# Patient Record
Sex: Female | Born: 1935 | Race: Black or African American | Hispanic: No | Smoking: Never smoker
Health system: Southern US, Community
[De-identification: ages and names within clinical notes are randomized; demographics above are authoritative.]

## PROBLEM LIST (undated history)

## (undated) DIAGNOSIS — M810 Age-related osteoporosis without current pathological fracture: Secondary | ICD-10-CM

## (undated) DIAGNOSIS — I739 Peripheral vascular disease, unspecified: Secondary | ICD-10-CM

## (undated) DIAGNOSIS — M65312 Trigger thumb, left thumb: Secondary | ICD-10-CM

## (undated) DIAGNOSIS — D72819 Decreased white blood cell count, unspecified: Secondary | ICD-10-CM

## (undated) DIAGNOSIS — R531 Weakness: Secondary | ICD-10-CM

## (undated) DIAGNOSIS — J310 Chronic rhinitis: Secondary | ICD-10-CM

## (undated) DIAGNOSIS — E7800 Pure hypercholesterolemia, unspecified: Secondary | ICD-10-CM

## (undated) DIAGNOSIS — D329 Benign neoplasm of meninges, unspecified: Secondary | ICD-10-CM

## (undated) DIAGNOSIS — I700 Atherosclerosis of aorta: Secondary | ICD-10-CM

## (undated) DIAGNOSIS — M1990 Unspecified osteoarthritis, unspecified site: Secondary | ICD-10-CM

## (undated) DIAGNOSIS — G459 Transient cerebral ischemic attack, unspecified: Secondary | ICD-10-CM

## (undated) DIAGNOSIS — K2970 Gastritis, unspecified, without bleeding: Secondary | ICD-10-CM

## (undated) DIAGNOSIS — I10 Essential (primary) hypertension: Secondary | ICD-10-CM

## (undated) DIAGNOSIS — H409 Unspecified glaucoma: Secondary | ICD-10-CM

## (undated) DIAGNOSIS — H269 Unspecified cataract: Secondary | ICD-10-CM

## (undated) DIAGNOSIS — A0472 Enterocolitis due to Clostridium difficile, not specified as recurrent: Secondary | ICD-10-CM

## (undated) DIAGNOSIS — K4020 Bilateral inguinal hernia, without obstruction or gangrene, not specified as recurrent: Secondary | ICD-10-CM

## (undated) DIAGNOSIS — R002 Palpitations: Secondary | ICD-10-CM

## (undated) DIAGNOSIS — L298 Other pruritus: Secondary | ICD-10-CM

## (undated) DIAGNOSIS — H532 Diplopia: Secondary | ICD-10-CM

## (undated) DIAGNOSIS — D649 Anemia, unspecified: Secondary | ICD-10-CM

## (undated) DIAGNOSIS — R55 Syncope and collapse: Secondary | ICD-10-CM

## (undated) DIAGNOSIS — R42 Dizziness and giddiness: Secondary | ICD-10-CM

## (undated) DIAGNOSIS — E785 Hyperlipidemia, unspecified: Secondary | ICD-10-CM

## (undated) DIAGNOSIS — E860 Dehydration: Secondary | ICD-10-CM

## (undated) HISTORY — DX: Atherosclerosis of aorta: I70.0

## (undated) HISTORY — DX: Peripheral vascular disease, unspecified: I73.9

## (undated) HISTORY — DX: Decreased white blood cell count, unspecified: D72.819

## (undated) HISTORY — DX: Hyperlipidemia, unspecified: E78.5

## (undated) HISTORY — DX: Diplopia: H53.2

## (undated) HISTORY — DX: Chronic rhinitis: J31.0

## (undated) HISTORY — DX: Unspecified glaucoma: H40.9

## (undated) HISTORY — DX: Palpitations: R00.2

## (undated) HISTORY — DX: Benign neoplasm of meninges, unspecified: D32.9

## (undated) HISTORY — DX: Gastritis, unspecified, without bleeding: K29.70

## (undated) HISTORY — DX: Bilateral inguinal hernia, without obstruction or gangrene, not specified as recurrent: K40.20

## (undated) HISTORY — DX: Trigger thumb, left thumb: M65.312

## (undated) HISTORY — PX: COLONOSCOPY: SHX174

## (undated) HISTORY — DX: Unspecified cataract: H26.9

## (undated) HISTORY — DX: Enterocolitis due to Clostridium difficile, not specified as recurrent: A04.72

## (undated) HISTORY — PX: TONSILLECTOMY: SUR1361

---
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
## 1968-04-27 HISTORY — PX: ANAL FISTULECTOMY: SHX1139

## 1988-12-26 DIAGNOSIS — G459 Transient cerebral ischemic attack, unspecified: Secondary | ICD-10-CM

## 1988-12-26 HISTORY — DX: Transient cerebral ischemic attack, unspecified: G45.9

## 1998-08-27 ENCOUNTER — Other Ambulatory Visit: Admission: RE | Admit: 1998-08-27 | Discharge: 1998-08-27 | Payer: Self-pay | Admitting: Gynecology

## 1999-04-17 ENCOUNTER — Encounter: Admission: RE | Admit: 1999-04-17 | Discharge: 1999-04-17 | Payer: Self-pay | Admitting: Gynecology

## 1999-04-17 ENCOUNTER — Encounter: Payer: Self-pay | Admitting: Gynecology

## 1999-10-14 ENCOUNTER — Other Ambulatory Visit: Admission: RE | Admit: 1999-10-14 | Discharge: 1999-10-14 | Payer: Self-pay | Admitting: Gynecology

## 1999-11-20 ENCOUNTER — Ambulatory Visit (HOSPITAL_COMMUNITY): Admission: RE | Admit: 1999-11-20 | Discharge: 1999-11-20 | Payer: Self-pay | Admitting: *Deleted

## 2000-04-22 ENCOUNTER — Encounter: Payer: Self-pay | Admitting: Gynecology

## 2000-04-22 ENCOUNTER — Encounter: Admission: RE | Admit: 2000-04-22 | Discharge: 2000-04-22 | Payer: Self-pay | Admitting: Gynecology

## 2000-05-04 ENCOUNTER — Ambulatory Visit (HOSPITAL_COMMUNITY): Admission: RE | Admit: 2000-05-04 | Discharge: 2000-05-04 | Payer: Self-pay | Admitting: Internal Medicine

## 2000-10-20 ENCOUNTER — Other Ambulatory Visit: Admission: RE | Admit: 2000-10-20 | Discharge: 2000-10-20 | Payer: Self-pay | Admitting: Gynecology

## 2001-04-25 ENCOUNTER — Encounter: Admission: RE | Admit: 2001-04-25 | Discharge: 2001-04-25 | Payer: Self-pay | Admitting: Gynecology

## 2001-04-25 ENCOUNTER — Encounter: Payer: Self-pay | Admitting: Gynecology

## 2001-04-27 DIAGNOSIS — R55 Syncope and collapse: Secondary | ICD-10-CM

## 2001-04-27 HISTORY — DX: Syncope and collapse: R55

## 2001-05-11 ENCOUNTER — Ambulatory Visit (HOSPITAL_COMMUNITY): Admission: RE | Admit: 2001-05-11 | Discharge: 2001-05-11 | Payer: Self-pay | Admitting: Internal Medicine

## 2001-06-10 ENCOUNTER — Encounter: Admission: RE | Admit: 2001-06-10 | Discharge: 2001-06-10 | Payer: Self-pay | Admitting: Orthopedic Surgery

## 2001-06-10 ENCOUNTER — Encounter: Payer: Self-pay | Admitting: Orthopedic Surgery

## 2001-11-14 ENCOUNTER — Other Ambulatory Visit: Admission: RE | Admit: 2001-11-14 | Discharge: 2001-11-14 | Payer: Self-pay | Admitting: Gynecology

## 2001-11-22 ENCOUNTER — Emergency Department (HOSPITAL_COMMUNITY): Admission: EM | Admit: 2001-11-22 | Discharge: 2001-11-22 | Payer: Self-pay | Admitting: Emergency Medicine

## 2001-11-22 ENCOUNTER — Encounter: Payer: Self-pay | Admitting: Emergency Medicine

## 2001-11-25 ENCOUNTER — Ambulatory Visit (HOSPITAL_COMMUNITY): Admission: RE | Admit: 2001-11-25 | Discharge: 2001-11-25 | Payer: Self-pay | Admitting: Internal Medicine

## 2002-04-11 ENCOUNTER — Encounter: Admission: RE | Admit: 2002-04-11 | Discharge: 2002-04-11 | Payer: Self-pay | Admitting: Gynecology

## 2002-04-11 ENCOUNTER — Encounter: Payer: Self-pay | Admitting: Gynecology

## 2003-04-24 ENCOUNTER — Encounter: Admission: RE | Admit: 2003-04-24 | Discharge: 2003-04-24 | Payer: Self-pay | Admitting: Gynecology

## 2003-11-22 ENCOUNTER — Other Ambulatory Visit: Admission: RE | Admit: 2003-11-22 | Discharge: 2003-11-22 | Payer: Self-pay | Admitting: Gynecology

## 2004-05-07 ENCOUNTER — Encounter: Admission: RE | Admit: 2004-05-07 | Discharge: 2004-05-07 | Payer: Self-pay | Admitting: Gynecology

## 2004-10-01 ENCOUNTER — Ambulatory Visit (HOSPITAL_COMMUNITY): Admission: RE | Admit: 2004-10-01 | Discharge: 2004-10-01 | Payer: Self-pay | Admitting: Gastroenterology

## 2004-10-02 ENCOUNTER — Ambulatory Visit (HOSPITAL_COMMUNITY): Admission: RE | Admit: 2004-10-02 | Discharge: 2004-10-02 | Payer: Self-pay | Admitting: Gastroenterology

## 2005-06-22 ENCOUNTER — Encounter: Admission: RE | Admit: 2005-06-22 | Discharge: 2005-06-22 | Payer: Self-pay | Admitting: Internal Medicine

## 2005-06-30 ENCOUNTER — Encounter: Admission: RE | Admit: 2005-06-30 | Discharge: 2005-06-30 | Payer: Self-pay | Admitting: Gynecology

## 2005-12-08 ENCOUNTER — Other Ambulatory Visit: Admission: RE | Admit: 2005-12-08 | Discharge: 2005-12-08 | Payer: Self-pay | Admitting: Gynecology

## 2006-08-10 ENCOUNTER — Encounter: Admission: RE | Admit: 2006-08-10 | Discharge: 2006-08-10 | Payer: Self-pay | Admitting: Internal Medicine

## 2007-07-26 ENCOUNTER — Encounter: Admission: RE | Admit: 2007-07-26 | Discharge: 2007-07-26 | Payer: Self-pay | Admitting: Internal Medicine

## 2007-08-18 ENCOUNTER — Encounter: Admission: RE | Admit: 2007-08-18 | Discharge: 2007-08-18 | Payer: Self-pay | Admitting: Gynecology

## 2008-08-21 ENCOUNTER — Encounter: Admission: RE | Admit: 2008-08-21 | Discharge: 2008-08-21 | Payer: Self-pay | Admitting: Internal Medicine

## 2009-08-27 ENCOUNTER — Encounter: Admission: RE | Admit: 2009-08-27 | Discharge: 2009-08-27 | Payer: Self-pay | Admitting: Internal Medicine

## 2010-08-04 ENCOUNTER — Other Ambulatory Visit: Payer: Self-pay | Admitting: Gynecology

## 2010-08-04 DIAGNOSIS — Z1231 Encounter for screening mammogram for malignant neoplasm of breast: Secondary | ICD-10-CM

## 2010-09-12 NOTE — Op Note (Signed)
Sydney [name], Sydney [name]               ACCOUNT NO.:  [id number]   MEDICAL RECORD NO.:  [id number]          PATIENT TYPE:  AMB   LOCATION:  ENDO                         FACILITY:  [facility]   PHYSICIAN:  Danise Edge, M.D.   DATE OF BIRTH:  1930-04-30   DATE OF PROCEDURE:  10/01/2004  DATE OF DISCHARGE:                                 OPERATIVE REPORT   PROCEDURE:  Incomplete screening colonoscopy.   INDICATIONS:  [name] mildly is a 77 year old female born Apr 04, 1931.  [name] was scheduled to undergo her first screening colonoscopy with  polypectomy to prevent colon cancer.  Due to colonic loop formation, I was  able to advance the scope to around the hepatic flexure.  I was unable to  examine the ascending colon, cecum or ileocecal valve.   ENDOSCOPIST:  Danise Edge, M.D.   PREMEDICATION:  Fentanyl 62.5 mcg, Versed 6 mg.   DESCRIPTION OF PROCEDURE:  After obtaining informed consent, [name] was  placed in the left lateral decubitus position.  I administered intravenous  fentanyl and intravenous Versed to achieve conscious sedation for the  procedure.  The patient's blood pressure, oxygen saturation and cardiac  rhythm were monitored throughout the procedure and documented in the medical  record.   Anal inspection and digital rectal exam were normal.  The Olympus adjustable  pediatric colonoscope was introduced into the rectum and advanced to the  hepatic flexure.  Despite repositioning the patient from the left lateral  decubitus position to the supine position and finally the right lateral  decubitus position applying external abdominal pressure, I was unable to  round the hepatic flexure and examine the right colon.  Colonic preparation  for the exam today was excellent.   FINDINGS:  1.  Rectum normal.  2.  Sigmoid colon and descending colon normal.  3.  Splenic flexure normal.  4.  Transverse colon normal.  5.  Hepatic flexure normal.  6.  Ascending colon,  cecum and ileocecal valve were not examined.   ASSESSMENT:  Normal screening proctocolonoscopy to the hepatic flexure   RECOMMENDATIONS:  [name] will undergo an air contrast barium enema at  the [facility] later this morning.      MJ/MEDQ  D:  10/01/2004  T:  10/01/2004  Job:  [id number]

## 2010-09-16 ENCOUNTER — Ambulatory Visit
Admission: RE | Admit: 2010-09-16 | Discharge: 2010-09-16 | Disposition: A | Discharge status: Home / Self Care | Payer: Medicare Other | Source: Ambulatory Visit | Attending: Gynecology | Admitting: Gynecology

## 2010-09-16 DIAGNOSIS — Z1231 Encounter for screening mammogram for malignant neoplasm of breast: Secondary | ICD-10-CM

## 2011-08-24 ENCOUNTER — Other Ambulatory Visit: Payer: Self-pay | Admitting: Gynecology

## 2011-08-24 DIAGNOSIS — Z1231 Encounter for screening mammogram for malignant neoplasm of breast: Secondary | ICD-10-CM

## 2011-10-01 ENCOUNTER — Ambulatory Visit
Admission: RE | Admit: 2011-10-01 | Discharge: 2011-10-01 | Disposition: A | Discharge status: Home / Self Care | Payer: No Typology Code available for payment source | Source: Ambulatory Visit | Attending: Gynecology | Admitting: Gynecology

## 2011-10-01 DIAGNOSIS — Z1231 Encounter for screening mammogram for malignant neoplasm of breast: Secondary | ICD-10-CM

## 2012-06-21 ENCOUNTER — Ambulatory Visit
Admission: RE | Admit: 2012-06-21 | Discharge: 2012-06-21 | Disposition: A | Discharge status: Home / Self Care | Payer: Medicare Other | Source: Ambulatory Visit | Attending: Internal Medicine | Admitting: Internal Medicine

## 2012-06-21 ENCOUNTER — Other Ambulatory Visit: Payer: Self-pay | Admitting: Internal Medicine

## 2012-06-21 DIAGNOSIS — R109 Unspecified abdominal pain: Secondary | ICD-10-CM

## 2012-06-21 MED ORDER — IOHEXOL 300 MG/ML  SOLN
100.0000 mL | Freq: Once | INTRAMUSCULAR | Status: AC | PRN
  Administered 2012-06-21: 100 mL via INTRAVENOUS

## 2012-08-30 ENCOUNTER — Other Ambulatory Visit: Payer: Self-pay

## 2012-08-30 DIAGNOSIS — Z1231 Encounter for screening mammogram for malignant neoplasm of breast: Secondary | ICD-10-CM

## 2012-10-20 ENCOUNTER — Ambulatory Visit
Admission: RE | Admit: 2012-10-20 | Discharge: 2012-10-20 | Disposition: A | Discharge status: Home / Self Care | Payer: Medicare Other | Source: Ambulatory Visit

## 2012-10-20 DIAGNOSIS — Z1231 Encounter for screening mammogram for malignant neoplasm of breast: Secondary | ICD-10-CM

## 2012-12-06 ENCOUNTER — Encounter (HOSPITAL_COMMUNITY): Payer: Self-pay | Admitting: Emergency Medicine

## 2012-12-06 ENCOUNTER — Emergency Department (HOSPITAL_COMMUNITY)
Admission: EM | Admit: 2012-12-06 | Discharge: 2012-12-06 | Disposition: A | Discharge status: Home / Self Care | Payer: Medicare Other | Attending: Emergency Medicine | Admitting: Emergency Medicine

## 2012-12-06 ENCOUNTER — Emergency Department (HOSPITAL_COMMUNITY): Payer: Medicare Other

## 2012-12-06 DIAGNOSIS — Z7902 Long term (current) use of antithrombotics/antiplatelets: Secondary | ICD-10-CM | POA: Insufficient documentation

## 2012-12-06 DIAGNOSIS — G459 Transient cerebral ischemic attack, unspecified: Secondary | ICD-10-CM | POA: Insufficient documentation

## 2012-12-06 DIAGNOSIS — I10 Essential (primary) hypertension: Secondary | ICD-10-CM | POA: Insufficient documentation

## 2012-12-06 DIAGNOSIS — M810 Age-related osteoporosis without current pathological fracture: Secondary | ICD-10-CM | POA: Insufficient documentation

## 2012-12-06 DIAGNOSIS — Z79899 Other long term (current) drug therapy: Secondary | ICD-10-CM | POA: Insufficient documentation

## 2012-12-06 DIAGNOSIS — R51 Headache: Secondary | ICD-10-CM | POA: Insufficient documentation

## 2012-12-06 DIAGNOSIS — Z7982 Long term (current) use of aspirin: Secondary | ICD-10-CM | POA: Insufficient documentation

## 2012-12-06 HISTORY — DX: Essential (primary) hypertension: I10

## 2012-12-06 HISTORY — DX: Age-related osteoporosis without current pathological fracture: M81.0

## 2012-12-06 LAB — COMPREHENSIVE METABOLIC PANEL
ALT: 14 U/L (ref 0–35)
AST: 25 U/L (ref 0–37)
Albumin: 3.8 g/dL (ref 3.5–5.2)
Alkaline Phosphatase: 76 U/L (ref 39–117)
CO2: 26 mEq/L (ref 19–32)
GFR calc Af Amer: >90 mL/min (ref >90)
GFR calc non Af Amer: 79 mL/min — ABNORMAL LOW (ref >90)
Sodium: 133 mEq/L — ABNORMAL LOW (ref 135–145)
Total Bilirubin: 0.3 mg/dL (ref 0.3–1.2)

## 2012-12-06 LAB — CBC
Hemoglobin: 12.4 g/dL (ref 12.0–15.0)
MCH: 31.1 pg (ref 26.0–34.0)
Platelets: 245 10*3/uL (ref 150–400)
RDW: 13.1 % (ref 11.5–15.5)
WBC: 2.7 10*3/uL — ABNORMAL LOW (ref 4.0–10.5)

## 2012-12-06 LAB — DIFFERENTIAL
Basophils Absolute: 0 10*3/uL (ref 0.0–0.1)
Basophils Relative: 0 % (ref 0–1)
Eosinophils Absolute: 0 10*3/uL (ref 0.0–0.7)
Eosinophils Relative: 0 % (ref 0–5)
Monocytes Absolute: 0.4 10*3/uL (ref 0.1–1.0)
Neutro Abs: 1.2 10*3/uL — ABNORMAL LOW (ref 1.7–7.7)

## 2012-12-06 LAB — TROPONIN I: Troponin I: <0.3 ng/mL (ref <0.30)

## 2012-12-06 LAB — POCT I-STAT TROPONIN I

## 2012-12-06 LAB — GLUCOSE, CAPILLARY: Glucose-Capillary: 92 mg/dL (ref 70–99)

## 2012-12-06 NOTE — ED Notes (Signed)
Pt c/o blurry vision at 1300 with right sided HA than then resolved; pt noted was hypertensive and feels back to normal at present

## 2012-12-06 NOTE — ED Provider Notes (Signed)
CSN: [id number]     Arrival date & time 12/06/12  1637 History     First MD Initiated Contact with Patient 12/06/12 1722     Chief Complaint  Patient presents with  . Blurred Vision  . Headache   (Consider location/radiation/quality/duration/timing/severity/associated sxs/prior Treatment) The history is provided by the patient, medical records and a relative. No language interpreter was used.   79 year old female presents emergency department chief complaint of stroke like symptoms.  Patient was sent to the ED by her primary care clinic and or TIA.  Patient states that around 1 PM this afternoon she began having visual changes in the left eye followed by intermittent headaches on the right side.  She's had several TIAs in the past and outpatient TIA workups including carotid Dopplers in 2007 that showed bilateral carotid without significant stenosis or decreased blood flow.  She states that this is exactly the same as her previous TIA symptoms.  She denies any other symptom of stroke including weakness, vertiginous symptoms, difficulty swallowing, facial droop, difficulty with speech, paresthesia.  Patient takes one baby aspirin daily.  Her symptoms lasted approximately one hour with resolution.  She had no return of her symptoms at that time.  Past Medical History  Diagnosis Date  . Hypertension   . Osteoporosis    History reviewed. No pertinent past surgical history. History reviewed. No pertinent family history. History  Substance Use Topics  . Smoking status: Never Smoker   . Smokeless tobacco: Not on file  . Alcohol Use: No   OB History   Grav Para Term Preterm Abortions TAB SAB Ect Mult Living                 Review of Systems Ten systems reviewed and are negative for acute change, except as noted in the HPI.    Allergies  Codeine; Demerol; and Sulfa antibiotics  Home Medications   Current Outpatient Rx  Name  Route  Sig  Dispense  Refill  . aspirin 81 MG tablet  Oral   Take 81 mg by mouth daily.         . cholecalciferol (VITAMIN D) 1000 UNITS tablet   Oral   Take 2,000 Units by mouth daily.         . clopidogrel (PLAVIX) 75 MG tablet   Oral   Take 75 mg by mouth daily.         . folic acid (FOLVITE) 1 MG tablet   Oral   Take 1 mg by mouth daily.         . hydrochlorothiazide (HYDRODIURIL) 25 MG tablet   Oral   Take 12.5 mg by mouth daily.         Marland Kitchen ketoconazole (NIZORAL) 2 % shampoo   Topical   Apply 1 application topically 2 (two) times a week.         . loratadine (CLARITIN) 10 MG tablet   Oral   Take 10 mg by mouth daily as needed for allergies.         . metoprolol (LOPRESSOR) 50 MG tablet   Oral   Take 25 mg by mouth 2 (two) times daily.         . potassium chloride (K-DUR) 10 MEQ tablet   Oral   Take 10 mEq by mouth daily.         Marland Kitchen triamcinolone cream (KENALOG) 0.1 %   Topical   Apply 1 application topically daily as needed.  BP 139/66  Pulse 55  Temp(Src) 97.7 F (36.5 C) (Oral)  Resp 14  SpO2 100% Physical Exam Physical Exam  Nursing note and vitals reviewed. Constitutional: She is oriented to person, place, and time. She appears well-developed and well-nourished. No distress.  Patient is lucid and mentally sharp HENT:  Head: Normocephalic and atraumatic.  Eyes: Conjunctivae normal and EOM are normal. Pupils are equal, round, and reactive to light. No scleral icterus.  Neck: Normal range of motion.  Cardiovascular: Normal rate, regular rhythm and normal heart sounds.  Exam reveals no gallop and no friction rub.   No murmur heard. Pulmonary/Chest: Effort normal and breath sounds normal. No respiratory distress.  Abdominal: Soft. Bowel sounds are normal. She exhibits no distension and no mass. There is no tenderness. There is no guarding.  Neurological: She is alert and oriented to person, place, and time.  Speech is clear and goal oriented, follows commands Major Cranial nerves  without deficit, no facial droop Normal strength in upper and lower extremities bilaterally including dorsiflexion and plantar flexion, strong and equal grip strength Sensation normal to light and sharp touch Moves extremities without ataxia, coordination intact Normal finger to nose and rapid alternating movements Neg romberg, no pronator drift Normal gait Normal heel-shin and balance Skin: Skin is warm and dry. She is not diaphoretic.    ED Course   Procedures (including critical care time)  Labs Reviewed  CBC - Abnormal; Notable for the following:    WBC 2.7 (*)    HCT 35.4 (*)    All other components within normal limits  DIFFERENTIAL - Abnormal; Notable for the following:    Neutro Abs 1.2 (*)    Monocytes Relative 14 (*)    All other components within normal limits  COMPREHENSIVE METABOLIC PANEL - Abnormal; Notable for the following:    Sodium 133 (*)    GFR calc non Af Amer 79 (*)    All other components within normal limits  TROPONIN I  GLUCOSE, CAPILLARY  POCT I-STAT TROPONIN I   Ct Head (brain) Wo Contrast  12/06/2012   *RADIOLOGY REPORT*  Clinical Data: Blurred vision and headache, now resolved  CT HEAD WITHOUT CONTRAST  Technique:  Contiguous axial images were obtained from the base of the skull through the vertex without contrast.  Comparison: None.  Findings: Mild diffuse cortical volume loss noted with proportional ventricular prominence. No acute hemorrhage, acute infarction, or mass lesion is seen.  Orbits and paranasal sinuses are intact.  IMPRESSION: No acute intracranial finding.   Original Report Authenticated By: Christiana Pellant, M.D.   1. TIA (transient ischemic attack)     MDM   Filed Vitals:   12/06/12 1919 12/06/12 1920 12/06/12 1930 12/06/12 2000  BP: 152/68  135/62 148/58  Pulse: 57  53 52  Temp:  97.8 F (36.6 C)    TempSrc:      Resp: 20     SpO2: 100%  100% 100%   Patient here with complete resolution of strokelike symptoms.  She is a  known history of TIA.  He has strong outpatient followup.  Her labs show glucopenia mild hyponatremia CT is negative for acute hemorrhage or infarct.  She has an unremarkable neurologic exam.  Patient's last carotid Dopplers weren't 2007 believe she needs repeat carotid Doppler studies to assess stenosis. The patient is seen and shared visit with Dr. Jodi Mourning, she is stable with no return of her neurologic symptoms.  She is safe for discharge with outpatient followup.  Patient requests to go home this evening.  We have discussed possibility of stroke and need to return immediately to the emergency department should he have return of her symptoms.  The patient expresses understanding and agrees with plan of care.  Arthor Captain, PA-C 12/10/12 1616

## 2012-12-12 ENCOUNTER — Other Ambulatory Visit: Payer: Self-pay | Admitting: Internal Medicine

## 2012-12-12 DIAGNOSIS — H538 Other visual disturbances: Secondary | ICD-10-CM

## 2012-12-14 NOTE — ED Provider Notes (Signed)
Medical screening examination/treatment/procedure(s) were conducted as a shared visit with non-physician practitioner(s) or resident  and myself.  I personally evaluated the patient during the encounter and agree with the findings and plan unless otherwise indicated.    Transient left eye vision changes with gradual onset HA.  Possible TIA vs migraine vs bleed.  PERRL, conj nl, CNs, strength, sensation, coordination all intact.  No sxs in ED.  TIA hx.  Offered observation in hospital for close monitoring of further stroke sxs however patient prefers outpt fup and has family nearby.  Strict reasons to return given.   Enid Skeens, MD 12/14/12 1224

## 2012-12-15 ENCOUNTER — Ambulatory Visit
Admission: RE | Admit: 2012-12-15 | Discharge: 2012-12-15 | Disposition: A | Discharge status: Home / Self Care | Payer: Medicare Other | Source: Ambulatory Visit | Attending: Internal Medicine | Admitting: Internal Medicine

## 2012-12-15 DIAGNOSIS — H538 Other visual disturbances: Secondary | ICD-10-CM

## 2013-02-17 ENCOUNTER — Emergency Department (HOSPITAL_COMMUNITY): Payer: Medicare Other

## 2013-02-17 ENCOUNTER — Encounter (HOSPITAL_COMMUNITY): Payer: Self-pay | Admitting: Emergency Medicine

## 2013-02-17 ENCOUNTER — Observation Stay (HOSPITAL_COMMUNITY)
Admission: EM | Admit: 2013-02-17 | Discharge: 2013-02-18 | Disposition: A | Discharge status: Home / Self Care | Payer: Medicare Other | Attending: Internal Medicine | Admitting: Internal Medicine

## 2013-02-17 DIAGNOSIS — Z881 Allergy status to other antibiotic agents status: Secondary | ICD-10-CM | POA: Insufficient documentation

## 2013-02-17 DIAGNOSIS — G459 Transient cerebral ischemic attack, unspecified: Secondary | ICD-10-CM | POA: Insufficient documentation

## 2013-02-17 DIAGNOSIS — Z882 Allergy status to sulfonamides status: Secondary | ICD-10-CM | POA: Insufficient documentation

## 2013-02-17 DIAGNOSIS — Z79899 Other long term (current) drug therapy: Secondary | ICD-10-CM | POA: Insufficient documentation

## 2013-02-17 DIAGNOSIS — R001 Bradycardia, unspecified: Secondary | ICD-10-CM | POA: Diagnosis present

## 2013-02-17 DIAGNOSIS — I10 Essential (primary) hypertension: Secondary | ICD-10-CM | POA: Insufficient documentation

## 2013-02-17 DIAGNOSIS — Z885 Allergy status to narcotic agent status: Secondary | ICD-10-CM | POA: Insufficient documentation

## 2013-02-17 DIAGNOSIS — I498 Other specified cardiac arrhythmias: Secondary | ICD-10-CM | POA: Insufficient documentation

## 2013-02-17 DIAGNOSIS — I6529 Occlusion and stenosis of unspecified carotid artery: Secondary | ICD-10-CM | POA: Diagnosis present

## 2013-02-17 DIAGNOSIS — E7800 Pure hypercholesterolemia, unspecified: Secondary | ICD-10-CM | POA: Insufficient documentation

## 2013-02-17 DIAGNOSIS — R55 Syncope and collapse: Principal | ICD-10-CM | POA: Insufficient documentation

## 2013-02-17 DIAGNOSIS — I369 Nonrheumatic tricuspid valve disorder, unspecified: Secondary | ICD-10-CM

## 2013-02-17 DIAGNOSIS — Z7902 Long term (current) use of antithrombotics/antiplatelets: Secondary | ICD-10-CM | POA: Insufficient documentation

## 2013-02-17 DIAGNOSIS — M810 Age-related osteoporosis without current pathological fracture: Secondary | ICD-10-CM | POA: Insufficient documentation

## 2013-02-17 HISTORY — DX: Pure hypercholesterolemia, unspecified: E78.00

## 2013-02-17 HISTORY — DX: Transient cerebral ischemic attack, unspecified: G45.9

## 2013-02-17 HISTORY — DX: Unspecified osteoarthritis, unspecified site: M19.90

## 2013-02-17 LAB — CBC
HCT: 33.7 % — ABNORMAL LOW (ref 36.0–46.0)
MCHC: 35.3 g/dL (ref 30.0–36.0)
Platelets: 220 10*3/uL (ref 150–400)
RBC: 3.78 MIL/uL — ABNORMAL LOW (ref 3.87–5.11)
RDW: 12.7 % (ref 11.5–15.5)
WBC: 2.5 10*3/uL — ABNORMAL LOW (ref 4.0–10.5)

## 2013-02-17 LAB — CBC WITH DIFFERENTIAL/PLATELET
Basophils Relative: 0 % (ref 0–1)
Eosinophils Relative: 0 % (ref 0–5)
HCT: 35.1 % — ABNORMAL LOW (ref 36.0–46.0)
Hemoglobin: 12.3 g/dL (ref 12.0–15.0)
MCH: 31.2 pg (ref 26.0–34.0)
MCHC: 35 g/dL (ref 30.0–36.0)
MCV: 89.1 fL (ref 78.0–100.0)
Monocytes Absolute: 0.3 10*3/uL (ref 0.1–1.0)
Monocytes Relative: 11 % (ref 3–12)
Neutro Abs: 1.8 10*3/uL (ref 1.7–7.7)
RDW: 12.7 % (ref 11.5–15.5)

## 2013-02-17 LAB — COMPREHENSIVE METABOLIC PANEL
Albumin: 3.7 g/dL (ref 3.5–5.2)
BUN: 10 mg/dL (ref 6–23)
Calcium: 9.3 mg/dL (ref 8.4–10.5)
Chloride: 99 mEq/L (ref 96–112)
Creatinine, Ser: 0.76 mg/dL (ref 0.50–1.10)
GFR calc non Af Amer: 78 mL/min — ABNORMAL LOW (ref >90)
Total Bilirubin: 0.5 mg/dL (ref 0.3–1.2)

## 2013-02-17 LAB — URINALYSIS, ROUTINE W REFLEX MICROSCOPIC
Bilirubin Urine: NEGATIVE
Ketones, ur: 15 mg/dL — AB
Nitrite: NEGATIVE
Specific Gravity, Urine: 1.015 (ref 1.005–1.030)

## 2013-02-17 LAB — TROPONIN I
Troponin I: <0.3 ng/mL (ref <0.30)
Troponin I: <0.3 ng/mL (ref <0.30)

## 2013-02-17 LAB — URINE MICROSCOPIC-ADD ON

## 2013-02-17 LAB — CREATININE, SERUM
GFR calc Af Amer: >90 mL/min (ref >90)
GFR calc non Af Amer: 80 mL/min — ABNORMAL LOW (ref >90)

## 2013-02-17 LAB — VITAMIN B12: Vitamin B-12: 338 pg/mL (ref 211–911)

## 2013-02-17 MED ORDER — SODIUM CHLORIDE 0.9 % IV SOLN
INTRAVENOUS | Status: AC
  Administered 2013-02-17: 15:00:00 via INTRAVENOUS

## 2013-02-17 MED ORDER — HEPARIN SODIUM (PORCINE) 5000 UNIT/ML IJ SOLN
5000.0000 [IU] | Freq: Three times a day (TID) | INTRAMUSCULAR | Status: DC
  Administered 2013-02-17 – 2013-02-18 (×2): 5000 [IU] via SUBCUTANEOUS
  Filled 2013-02-17 (×6): qty 1

## 2013-02-17 MED ORDER — ISOSORB DINITRATE-HYDRALAZINE 20-37.5 MG PO TABS
0.5000 | ORAL_TABLET | Freq: Two times a day (BID) | ORAL | Status: DC
  Administered 2013-02-17: 0.5 via ORAL
  Filled 2013-02-17 (×4): qty 0.5

## 2013-02-17 MED ORDER — HYDROCHLOROTHIAZIDE 12.5 MG PO CAPS
12.5000 mg | ORAL_CAPSULE | Freq: Every day | ORAL | Status: DC
  Filled 2013-02-17 (×2): qty 1

## 2013-02-17 MED ORDER — CLOPIDOGREL BISULFATE 75 MG PO TABS
75.0000 mg | ORAL_TABLET | Freq: Every day | ORAL | Status: DC
  Administered 2013-02-17 – 2013-02-18 (×2): 75 mg via ORAL
  Filled 2013-02-17 (×2): qty 1

## 2013-02-17 MED ORDER — HYDROCHLOROTHIAZIDE 25 MG PO TABS
12.5000 mg | ORAL_TABLET | Freq: Every day | ORAL | Status: DC
  Filled 2013-02-17: qty 0.5

## 2013-02-17 MED ORDER — ONDANSETRON HCL 4 MG/2ML IJ SOLN: 4.0000 mg | Freq: Four times a day (QID) | INTRAMUSCULAR | Status: DC | PRN

## 2013-02-17 MED ORDER — VITAMIN D3 25 MCG (1000 UNIT) PO TABS
1000.0000 [IU] | ORAL_TABLET | Freq: Every day | ORAL | Status: DC
  Administered 2013-02-17 – 2013-02-18 (×2): 1000 [IU] via ORAL
  Filled 2013-02-17 (×2): qty 1

## 2013-02-17 MED ORDER — POTASSIUM CHLORIDE CRYS ER 20 MEQ PO TBCR
40.0000 meq | EXTENDED_RELEASE_TABLET | Freq: Once | ORAL | Status: AC
  Administered 2013-02-17: 40 meq via ORAL
  Filled 2013-02-17: qty 2

## 2013-02-17 MED ORDER — ACETAMINOPHEN 325 MG PO TABS: 650.0000 mg | ORAL_TABLET | Freq: Four times a day (QID) | ORAL | Status: DC | PRN

## 2013-02-17 MED ORDER — FOLIC ACID 1 MG PO TABS
1.0000 mg | ORAL_TABLET | Freq: Every day | ORAL | Status: DC
  Administered 2013-02-17 – 2013-02-18 (×2): 1 mg via ORAL
  Filled 2013-02-17 (×2): qty 1

## 2013-02-17 MED ORDER — SODIUM CHLORIDE 0.9 % IJ SOLN
3.0000 mL | Freq: Two times a day (BID) | INTRAMUSCULAR | Status: DC
  Administered 2013-02-17: 3 mL via INTRAVENOUS

## 2013-02-17 MED ORDER — ONDANSETRON HCL 4 MG PO TABS: 4.0000 mg | ORAL_TABLET | Freq: Four times a day (QID) | ORAL | Status: DC | PRN

## 2013-02-17 MED ORDER — ACETAMINOPHEN 650 MG RE SUPP: 650.0000 mg | Freq: Four times a day (QID) | RECTAL | Status: DC | PRN

## 2013-02-17 NOTE — ED Notes (Signed)
[place], MD at bedside for evaluation. 

## 2013-02-17 NOTE — ED Notes (Signed)
Per EMS, patient got up about 0800 this morning and she got weak.   Patient's daughter called EMS with complaints of patient feeling weak.   Patient did have a syncopal episode while sitting in chair witnessed by EMS.   Patient advised no other symptoms.   Drop in pressure to 90/50 right after syncopal episode.  Patient has not taken home meds today.   Last BP by EMS 127/66 in trendelenburg.  CBG was 88 with EMS.  20 L AC placed by EMS.

## 2013-02-17 NOTE — H&P (Signed)
[facility] History and Physical  BYRD TERRERO ZOX:[id number] DOB: 16-Oct-1930 DOA: 02/17/2013  Referring physician: Dr. Loretha Stapler PCP: No primary provider on file.   Chief Complaint: syncope  HPI: Sydney [name] is a 79 y.o. female with a past medical history significant for osteoporosis, hypertension, carotid artery disease and TIA; came to the hospital after his visit in 7 of sudden onset of syncope. Patient reports that she woke up feeling herself and in her usual health state this morning went to the bathroom and had a BM and then p[roceed to sit down on dinning room for breakfast. Patient felt generalized weakness and w/o prodromic symptoms pass out. No signs or seizure activity appreciated, patient denies urine or fecal incontinence, no tongue bite. Also denies fever, chills, nausea, vomiting, palpitation, CP, HA, foal weakness or any other complaints. History was corroborated by daughter at bedside. In the ED patient was found to be bradycardic and with mild hypokalemia. Otherwise no abnormalities were appreciated. [facility] has been called to admit the patient for further evaluation and treatment of her syncope  Review of Systems: Negative except as otherwise mentioned on history of present illness.  Past Medical History  Diagnosis Date  . Hypertension   . Osteoporosis   . TIA (transient ischemic attack)    Past Surgical History  Procedure Laterality Date  . Tonsillectomy     Social History:  reports that she has never smoked. She does not have any smokeless tobacco history on file. She reports that she does not drink alcohol or use illicit drugs.  Allergies  Allergen Reactions  . Demerol [Meperidine] Other (See Comments)    Excessive sweating, cramping  . Sulfa Antibiotics Other (See Comments)    Reaction unknown  . Codeine Rash    Family history: Significant for heart problems and stroke; otherwise noncontributory.  Prior to Admission medications    Medication Sig Start Date End Date Taking? Authorizing Provider  cholecalciferol (VITAMIN D) 1000 UNITS tablet Take 1,000 Units by mouth daily.    Yes Historical Provider, MD  clopidogrel (PLAVIX) 75 MG tablet Take 75 mg by mouth daily.   Yes Historical Provider, MD  folic acid (FOLVITE) 1 MG tablet Take 1 mg by mouth daily.   Yes Historical Provider, MD  hydrochlorothiazide (HYDRODIURIL) 25 MG tablet Take 12.5 mg by mouth daily.   Yes Historical Provider, MD  metoprolol (LOPRESSOR) 50 MG tablet Take 25 mg by mouth 2 (two) times daily.   Yes Historical Provider, MD  potassium chloride (MICRO-K) 10 MEQ CR capsule Take 10 mEq by mouth daily.   Yes Historical Provider, MD   Physical Exam: Filed Vitals:   02/17/13 1150  BP: 149/75  Pulse: 57  Temp: 98.4 F (36.9 C)  Resp: 18     General:  Alert, awake and oriented x3, afebrile, able to speak in full sentences and without any acute complaints  Eyes: PERRLA, no icterus, extraocular muscles intact, no nystagmus  ENT: Mild mucosal membranes dryness, no erythema or exudate inside her mouth, no thrush; no drainage appreciated from ears or nostrils  Neck: Supple, no JVD, no thyromegaly  Cardiovascular: S1 and S2, positive bradycardia, no rubs, no murmurs, no gallops  Respiratory: Good air movement bilaterally, no wheezing, no crackles  Abdomen: Soft, nontender, nondistended, positive bowel sounds  Skin: No rash, no petechiae  Musculoskeletal: Full range of motion, no joint swelling or erythema, lower extremities without signs of edema, cyanosis or clubbing  Psychiatric: Mood appropriate; no suicidal ideation or  hallucinations  Neurologic: Alert, awake and oriented x3, cranial nerves grossly intact, muscle strength 5 out of 5 bilaterally and symmetrically; patient denies any specific focal sensory deficit, steady gait; normal finger to nose.  Labs on Admission:  Basic Metabolic Panel:  Recent Labs Lab 02/17/13 0921  NA 135  K  3.4*  CL 99  CO2 25  GLUCOSE 118*  BUN 10  CREATININE 0.76  CALCIUM 9.3   Liver Function Tests:  Recent Labs Lab 02/17/13 0921  AST 23  ALT 11  ALKPHOS 63  BILITOT 0.5  PROT 6.8  ALBUMIN 3.7   CBC:  Recent Labs Lab 02/17/13 0921  WBC 2.9*  NEUTROABS 1.8  HGB 12.3  HCT 35.1*  MCV 89.1  PLT 213   Cardiac Enzymes:  Recent Labs Lab 02/17/13 0924  TROPONINI <0.30    Radiological Exams on Admission: Dg Chest Port 1 View  02/17/2013   CLINICAL DATA:  Syncopal episode. History of hypertension.  EXAM: PORTABLE CHEST - 1 VIEW  COMPARISON:  Abdominal CT 06/21/2012. Prior chest radiographs are unavailable.  FINDINGS: 0930 hr. The heart size and mediastinal contours are normal. The lungs are clear. There is no pleural effusion or pneumothorax. No acute osseous findings are identified. Telemetry leads overlie the chest.  IMPRESSION: No active cardiopulmonary process.   Electronically Signed   By: Roxy Horseman M.D.   On: 02/17/2013 09:41    EKG:  Sinus rhythm, bradycardia with heart rate of 51, normal axis; no ST-T wave abnormalities for ischemia. Normal PR and QT intervals  Assessment/Plan 1-Syncope: appears to be secondary to vasovagal vs cardiogenic shock. -will admit to telemetry -hold metoprolol -check TSH, EKG and 2-D echo -will replete electrolytes -patient will benefit of the event monitor as an outpatient if workup during this admission is nonconclusive.. -Will check orthostatic vital signs in the morning and provide gentle hydration  2-Bradycardia: Most likely secondary to the use of metoprolol. -Will discontinue metoprolol. -Will cycle troponin -Will check 2-D echo  3-HTN (hypertension): Currently stable. -Will continue HCTZ -Will start low-dose bidil -Discontinue metoprolol (given bradycardia)  4-Occlusion and stenosis of carotid artery without mention of cerebral infarction: 50% occlusion bilateral without history of a stroke or neurologic  abnormalities. -Will check lipid panel -Continue Plavix  5-TIA (transient ischemic attack): Nonspecific neurologic changes or symptoms suggesting TIA this time. -Neurologic exam within normal limits. -Continue Plavix  DVT: Heparin   Code Status: Full Family Communication: Daughter at bedside Disposition Plan: Telemetry bed, observation status; LOS < 2 midnight  Time spent: 50 minutes  Obert Espindola [facility] Pager [phone]  If 7PM-7AM, please contact night-coverage [email or web address] Password [email or web address] 02/17/2013, 2:09 PM

## 2013-02-17 NOTE — ED Provider Notes (Signed)
CSN: [id number]     Arrival date & time 02/17/13  0847 History   First MD Initiated Contact with Patient 02/17/13 [phone]     Chief Complaint  Patient presents with  . Loss of Consciousness   (Consider location/radiation/quality/duration/timing/severity/associated sxs/prior Treatment) Patient is a 79 y.o. female presenting with syncope.  Loss of Consciousness Episode history:  Single Most recent episode:  Today Timing:  Constant Progression:  Resolved Chronicity:  New Context: normal activity   Witnessed: yes   Relieved by:  Nothing Associated symptoms: no chest pain, no diaphoresis, no difficulty breathing, no fever, no focal weakness, no nausea, no recent injury, no seizures, no shortness of breath, no vomiting and no weakness     Past Medical History  Diagnosis Date  . Hypertension   . Osteoporosis   . TIA (transient ischemic attack)    Past Surgical History  Procedure Laterality Date  . Tonsillectomy     No family history on file. History  Substance Use Topics  . Smoking status: Never Smoker   . Smokeless tobacco: Not on file  . Alcohol Use: No   OB History   Grav Para Term Preterm Abortions TAB SAB Ect Mult Living                 Review of Systems  Constitutional: Negative for fever and diaphoresis.  HENT: Negative for congestion.   Respiratory: Negative for cough and shortness of breath.   Cardiovascular: Positive for syncope. Negative for chest pain.  Gastrointestinal: Negative for nausea, vomiting, abdominal pain and diarrhea.  Neurological: Negative for focal weakness, seizures and weakness.  All other systems reviewed and are negative.    Allergies  Demerol; Sulfa antibiotics; and Codeine  Home Medications   Current Outpatient Rx  Name  Route  Sig  Dispense  Refill  . cholecalciferol (VITAMIN D) 1000 UNITS tablet   Oral   Take 1,000 Units by mouth daily.          . clopidogrel (PLAVIX) 75 MG tablet   Oral   Take 75 mg by mouth daily.        . folic acid (FOLVITE) 1 MG tablet   Oral   Take 1 mg by mouth daily.         . hydrochlorothiazide (HYDRODIURIL) 25 MG tablet   Oral   Take 12.5 mg by mouth daily.         . metoprolol (LOPRESSOR) 50 MG tablet   Oral   Take 25 mg by mouth 2 (two) times daily.         . potassium chloride (MICRO-K) 10 MEQ CR capsule   Oral   Take 10 mEq by mouth daily.          BP 136/68  Pulse 51  Temp(Src) 97.8 F (36.6 C) (Oral)  Resp 16  Ht 5\' 3"  (1.6 m)  Wt 112 lb (50.803 kg)  BMI 19.84 kg/m2  SpO2 98% Physical Exam  Nursing note and vitals reviewed. Constitutional: She is oriented to person, place, and time. She appears well-developed and well-nourished. No distress.  HENT:  Head: Normocephalic and atraumatic.  Mouth/Throat: Oropharynx is clear and moist.  Eyes: Conjunctivae are normal. Pupils are equal, round, and reactive to light. No scleral icterus.  Neck: Neck supple.  Cardiovascular: Normal rate, regular rhythm, normal heart sounds and intact distal pulses.   No murmur heard. Pulmonary/Chest: Effort normal and breath sounds normal. No stridor. No respiratory distress. She has no rales.  Abdominal: Soft.  Bowel sounds are normal. She exhibits no distension. There is no tenderness. There is no rebound and no guarding.  Musculoskeletal: Normal range of motion.  Neurological: She is alert and oriented to person, place, and time.  Skin: Skin is warm and dry. No rash noted.  Psychiatric: She has a normal mood and affect. Her behavior is normal.    ED Course  Procedures (including critical care time) Labs Review Labs Reviewed  CBC WITH DIFFERENTIAL - Abnormal; Notable for the following:    WBC 2.9 (*)    HCT 35.1 (*)    All other components within normal limits  COMPREHENSIVE METABOLIC PANEL - Abnormal; Notable for the following:    Potassium 3.4 (*)    Glucose, Bld 118 (*)    GFR calc non Af Amer 78 (*)    GFR calc Af Amer 90 (*)    All other components  within normal limits  TROPONIN I  URINALYSIS, ROUTINE W REFLEX MICROSCOPIC   Imaging Review Dg Chest Port 1 View  02/17/2013   CLINICAL DATA:  Syncopal episode. History of hypertension.  EXAM: PORTABLE CHEST - 1 VIEW  COMPARISON:  Abdominal CT 06/21/2012. Prior chest radiographs are unavailable.  FINDINGS: 0930 hr. The heart size and mediastinal contours are normal. The lungs are clear. There is no pleural effusion or pneumothorax. No acute osseous findings are identified. Telemetry leads overlie the chest.  IMPRESSION: No active cardiopulmonary process.   Electronically Signed   By: Roxy Horseman M.D.   On: 02/17/2013 09:41    EKG Interpretation   None     EKG - sinus rhythm, rate 51, normal axis, normal intervals, nonspecific t wave changes in inferior leads, compared to prior, nonspecific t wave changes new.    MDM   1. Syncope    79 year old female with a syncopal episode this morning.  Occurred while she was sitting at the breakfast table. Approximately 10 minutes prior she had a normal bowel movement. She had not recently stood up or changed positions. Prior to episode, she felt generalized weakness, but denied chest pain, shortness of breath, diaphoresis, nausea. She has not recently been ill.    Labwork unrevealing.  Admitted for further syncope workup.    Candyce Churn, MD 02/17/13 [phone]

## 2013-02-17 NOTE — Progress Notes (Signed)
  Echocardiogram 2D Echocardiogram has been performed.  Sydney [name] 02/17/2013, 4:10 PM

## 2013-02-17 NOTE — ED Notes (Signed)
Phlebotomy at bedside for blood draw.

## 2013-02-18 LAB — LIPID PANEL
Cholesterol: 188 mg/dL (ref 0–200)
HDL: 78 mg/dL (ref >39)
LDL Cholesterol: 103 mg/dL — ABNORMAL HIGH (ref 0–99)
Triglycerides: 36 mg/dL (ref <150)
VLDL: 7 mg/dL (ref 0–40)

## 2013-02-18 LAB — BASIC METABOLIC PANEL
BUN: 8 mg/dL (ref 6–23)
CO2: 20 mEq/L (ref 19–32)
Chloride: 104 mEq/L (ref 96–112)
Creatinine, Ser: 0.63 mg/dL (ref 0.50–1.10)
GFR calc Af Amer: >90 mL/min (ref >90)
Potassium: 3.8 mEq/L (ref 3.5–5.1)

## 2013-02-18 LAB — CBC
HCT: 31.1 % — ABNORMAL LOW (ref 36.0–46.0)
Hemoglobin: 10.9 g/dL — ABNORMAL LOW (ref 12.0–15.0)
MCV: 88.6 fL (ref 78.0–100.0)
RDW: 12.8 % (ref 11.5–15.5)
WBC: 3.3 10*3/uL — ABNORMAL LOW (ref 4.0–10.5)

## 2013-02-18 MED ORDER — METOPROLOL TARTRATE 50 MG PO TABS: 25.0000 mg | ORAL_TABLET | Freq: Every day | ORAL | Status: DC

## 2013-02-18 NOTE — Plan of Care (Signed)
Patient is 79year old female with hx of HTN, carotid disease, previous TIA admitted with syncopal episode. Patient informed me that her PCP is Dr Maxwell Caul. I informed Dr Donette Larry about the patient in detail. Dr Donette Larry will assume care as of today. I will sign off.  Patient and her daughter appreciative of communication with her PCP, Dr Donette Larry.   Konnor Jorden M.D. [facility] 02/18/2013, 8:53 AM  Pager: [phone]

## 2013-02-18 NOTE — Evaluation (Signed)
Physical Therapy Evaluation Patient Details Name: Sydney [name] MRN: [id number] DOB: October 06, 1930 Today's Date: 02/18/2013 Time: 2440-1027 PT Time Calculation (min): 22 min  PT Assessment / Plan / Recommendation History of Present Illness  Patient is an 79 yo female admitted following syncopal episode.  Patient with h/o HTN, CAD, TIA.  Clinical Impression  Patient is independent with mobility and gait.  Good balance.  No acute PT needs.  Patient to be d/c today.    PT Assessment  Patent does not need any further PT services    Follow Up Recommendations  No PT follow up;Supervision - Intermittent    Does the patient have the potential to tolerate intense rehabilitation      Barriers to Discharge        Equipment Recommendations  None recommended by PT    Recommendations for Other Services     Frequency      Precautions / Restrictions Precautions Precautions: None Restrictions Weight Bearing Restrictions: No   Pertinent Vitals/Pain See vital signs for orthostatic BP's.  BP increased with position advancement.      Mobility  Bed Mobility Bed Mobility: Supine to Sit;Sitting - Scoot to Edge of Bed Supine to Sit: 7: Independent Sitting - Scoot to Edge of Bed: 7: Independent Transfers Transfers: Sit to Stand;Stand to Sit Sit to Stand: 7: Independent;From bed Stand to Sit: 7: Independent;To chair/3-in-1 Ambulation/Gait Ambulation/Gait Assistance: 7: Independent Ambulation Distance (Feet): 300 Feet Assistive device: None Ambulation/Gait Assistance Details: Patient with good balance, gait pattern and speed. Gait Pattern: Within Functional Limits Gait velocity: [facility]        PT Goals(Current goals can be found in the care plan section)  N/A  Visit Information  Last PT Received On: 02/18/13 Assistance Needed: +1 History of Present Illness: Patient is an 79 yo female admitted following syncopal episode.  Patient with h/o HTN, CAD, TIA.       Prior Functioning  Home Living Family/patient expects to be discharged to:: Private residence Living Arrangements: Children Available Help at Discharge: Family;Available PRN/intermittently Type of Home: House Home Access: Stairs to enter [employer] of Steps: 4 Entrance Stairs-Rails: Right Home Layout: One level Home Equipment: None Prior Function Level of Independence: Independent Communication Communication: No difficulties    Cognition  Cognition Arousal/Alertness: Awake/alert Behavior During Therapy: WFL for tasks assessed/performed Overall Cognitive Status: Within Functional Limits for tasks assessed    Extremity/Trunk Assessment Upper Extremity Assessment Upper Extremity Assessment: Overall WFL for tasks assessed Lower Extremity Assessment Lower Extremity Assessment: Overall WFL for tasks assessed   Balance Balance Balance Assessed: Yes High Level Balance High Level Balance Activites: Direction changes;Turns;Sudden stops;Head turns High Level Balance Comments: No loss of balance with high level balance activity.  End of Session PT - End of Session Activity Tolerance: Patient tolerated treatment well Patient left: in chair;with call bell/phone within reach;with family/visitor present Nurse Communication: Mobility status (Good BP readings)  GP Functional Assessment Tool Used: Clinical judgement Functional Limitation: Mobility: Walking and moving around Mobility: Walking and Moving Around Current Status (O5366): 0 percent impaired, limited or restricted Mobility: Walking and Moving Around Goal Status (Y4034): 0 percent impaired, limited or restricted Mobility: Walking and Moving Around Discharge Status [phone]): 0 percent impaired, limited or restricted   Vena Austria 02/18/2013, 10:47 AM Durenda Hurt. Renaldo Fiddler, [facility] Acute Rehab Services Pager [phone]

## 2013-02-18 NOTE — Discharge Summary (Signed)
Physician Discharge Summary  Patient ID: Sydney [name] MRN: [id number] DOB/AGE: 1931/03/03 79 y.o.  Admit date: 02/17/2013 Discharge date: 02/18/2013  Admission Diagnoses:  Discharge Diagnoses:  Principal Problem:   Syncope Active Problems:   Bradycardia   HTN (hypertension)   Occlusion and stenosis of carotid artery without mention of cerebral infarction   TIA (transient ischemic attack)   Discharged Condition: good  Hospital Course: 32 with episode of syncope  Few sec  Syncopal episode at home- no CP. No sob, no dizzy, no Palpitation, no HA prior W/u- EKG/ telemetry normal; mild bradycardic and low BP on arrival Markers negative Echo- normal MS normal. Lab- ok No c/o in am Decrease BB to 1/2 dose HTN- ok  No evidence of Stroke H/o TIA- continue Plavix Ok for d/c with f/u in 1 week    Consults: None  Significant Diagnostic Studies: labs: normal- HGB 10.9; WBC 3.3; K 3.8 and radiology: CXR: normal  Treatments: IV hydration  Discharge Exam: Blood pressure 111/74, pulse 91, temperature 98.8 F (37.1 C), temperature source Oral, resp. rate 18, height 5\' 3"  (1.6 m), weight 50.803 kg (112 lb), SpO2 98.00%. General appearance: alert Resp: clear to auscultation bilaterally Cardio: regular rate and rhythm Neurologic: Grossly normal  Disposition: 01-Home or Self Care  Discharge Orders   Future Appointments Provider Department Dept Phone   Sep 19, 202014 9:30 AM Dara Lords, MD [facility] [phone]   Future Orders Complete By Expires   Diet - low sodium heart healthy  As directed    Increase activity slowly  As directed        Medication List         cholecalciferol 1000 UNITS tablet  Commonly known as:  VITAMIN D  Take 1,000 Units by mouth daily.     clopidogrel 75 MG tablet  Commonly known as:  PLAVIX  Take 75 mg by mouth daily.     folic acid 1 MG tablet  Commonly known as:  FOLVITE  Take 1 mg by mouth daily.     hydrochlorothiazide 25 MG tablet  Commonly known as:  HYDRODIURIL  Take 12.5 mg by mouth daily.     metoprolol 50 MG tablet  Commonly known as:  LOPRESSOR  Take 0.5 tablets (25 mg total) by mouth daily.     potassium chloride 10 MEQ CR capsule  Commonly known as:  MICRO-K  Take 10 mEq by mouth daily.           Follow-up Information   Follow up In 1 week.      Follow up with Georgann Housekeeper, MD.   Specialty:  Internal Medicine   Contact information:   [address], [address] [place] [address] [phone]       Signed: Georgann Housekeeper 02/18/2013, 9:56 AM

## 2013-02-22 ENCOUNTER — Encounter: Payer: Self-pay | Admitting: Gynecology

## 2013-02-22 ENCOUNTER — Ambulatory Visit (INDEPENDENT_AMBULATORY_CARE_PROVIDER_SITE_OTHER): Payer: Medicare Other | Admitting: Gynecology

## 2013-02-22 VITALS — BP 104/60 | Ht 63.0 in | Wt 107.0 lb

## 2013-02-22 DIAGNOSIS — N952 Postmenopausal atrophic vaginitis: Secondary | ICD-10-CM

## 2013-02-22 DIAGNOSIS — M810 Age-related osteoporosis without current pathological fracture: Secondary | ICD-10-CM

## 2013-02-22 NOTE — Patient Instructions (Signed)
Followup in one year for reexamination. Sooner if any gynecologic issues.

## 2013-02-22 NOTE — Progress Notes (Addendum)
SOLENNE MANWARREN Sep 27, 1930 [id number]        79 y.o.  G1P1001 new patient for followup exam.  Former patient of Dr. Nicholas Lose. Several issues noted below.  Past medical history,surgical history, medications, allergies, family history and social history were all reviewed and documented in the EPIC chart.  ROS:  Performed and pertinent positives and negatives are included in the history, assessment and plan .  Exam: Kim assistant Filed Vitals:   02/22/13 0936  BP: 104/60  Height: 5\' 3"  (1.6 m)  Weight: 107 lb (48.535 kg)   General appearance  Normal Skin grossly normal Head/Neck normal with no cervical or supraclavicular adenopathy thyroid normal Lungs  clear Cardiac RR, without RMG Abdominal  soft, nontender, without masses, organomegaly or hernia Breasts  examined lying and sitting without masses, retractions, discharge or axillary adenopathy. Pelvic  Ext/BUS/vagina  atrophic changes  Cervix  normal with atrophic changes  Uterus  anteverted, normal size, shape and contour, midline and mobile nontender   Adnexa  Without masses or tenderness    Anus and perineum  normal   Rectovaginal  normal sphincter tone without palpated masses or tenderness.    Assessment/Plan:  79 y.o. G27P1001 female for annual exam.   1. Postmenopausal/atrophic genital changes. Patient's doing well without significant hot flushes, night sweats, vaginal dryness. Not sexually active. Will continue to monitor. Patient knows to report any bleeding. 2. Osteoporosis historically. I have no copies of her bone densities. Reports having one last year. Has never been on osteoporosis medication historically. Will obtain copies of her bone density and discussed with her in the chest osteoporosis that we should consider treatment to hopefully prevent fracture. Patient agrees with this and will followup with me. 3. Pap smear reported 2013 by Dr. Nicholas Lose. No Pap smear done today. No history of abnormal Pap smears previously.  Discussed current screening guidelines and will plan stop screening issues over the age of 75. 4. Mammography 09/2012. Continue with annual mammography. SBE monthly reviewed. 5. Colonoscopy 5 years ago. She's going to check with Dr. Eula Listen as to when she is due for another one. She is actually seeing him today. 6. Health maintenance. No blood work done as it is done per Dr. Paulita Fujita office. Followup for bone density discussion once I receive the reports otherwise annually. 7.  8. Addendum: Received DEXA report from [facility] physicians 07/2011 which shows T score -2.3 with FRAX 6.9%/2.3% based upon this then plan expectant management.  Note: This document was prepared with digital dictation and possible smart phrase technology. Any transcriptional errors that result from this process are unintentional.   Dara Lords MD, 10:21 AM 02/22/2013

## 2013-02-23 LAB — URINALYSIS W MICROSCOPIC + REFLEX CULTURE
Bacteria, UA: NONE SEEN
Bilirubin Urine: NEGATIVE
Casts: NONE SEEN
Crystals: NONE SEEN
Hgb urine dipstick: NEGATIVE
Ketones, ur: NEGATIVE mg/dL
Nitrite: NEGATIVE
Protein, ur: NEGATIVE mg/dL
Urobilinogen, UA: 0.2 mg/dL (ref 0.0–1.0)
pH: 7.5 (ref 5.0–8.0)

## 2013-03-22 ENCOUNTER — Other Ambulatory Visit: Payer: Self-pay | Admitting: Nurse Practitioner

## 2013-03-22 ENCOUNTER — Ambulatory Visit
Admission: RE | Admit: 2013-03-22 | Discharge: 2013-03-22 | Disposition: A | Discharge status: Home / Self Care | Payer: Medicare Other | Source: Ambulatory Visit | Attending: Nurse Practitioner | Admitting: Nurse Practitioner

## 2013-03-22 DIAGNOSIS — M7989 Other specified soft tissue disorders: Secondary | ICD-10-CM

## 2013-03-22 DIAGNOSIS — R55 Syncope and collapse: Secondary | ICD-10-CM

## 2013-03-22 DIAGNOSIS — M79602 Pain in left arm: Secondary | ICD-10-CM

## 2013-03-27 ENCOUNTER — Ambulatory Visit
Admission: RE | Admit: 2013-03-27 | Discharge: 2013-03-27 | Disposition: A | Discharge status: Home / Self Care | Payer: Medicare Other | Source: Ambulatory Visit | Attending: Nurse Practitioner | Admitting: Nurse Practitioner

## 2013-03-27 DIAGNOSIS — M7989 Other specified soft tissue disorders: Secondary | ICD-10-CM

## 2013-03-27 DIAGNOSIS — R55 Syncope and collapse: Secondary | ICD-10-CM

## 2013-03-27 DIAGNOSIS — M79602 Pain in left arm: Secondary | ICD-10-CM

## 2013-09-14 ENCOUNTER — Other Ambulatory Visit: Payer: Self-pay

## 2013-09-14 DIAGNOSIS — Z1231 Encounter for screening mammogram for malignant neoplasm of breast: Secondary | ICD-10-CM

## 2013-09-24 ENCOUNTER — Encounter (HOSPITAL_COMMUNITY): Payer: Self-pay | Admitting: Emergency Medicine

## 2013-09-24 ENCOUNTER — Emergency Department (HOSPITAL_COMMUNITY)
Admission: EM | Admit: 2013-09-24 | Discharge: 2013-09-24 | Disposition: A | Discharge status: Home / Self Care | Payer: Medicare Other | Attending: Emergency Medicine | Admitting: Emergency Medicine

## 2013-09-24 DIAGNOSIS — Z79899 Other long term (current) drug therapy: Secondary | ICD-10-CM | POA: Insufficient documentation

## 2013-09-24 DIAGNOSIS — Z8639 Personal history of other endocrine, nutritional and metabolic disease: Secondary | ICD-10-CM | POA: Insufficient documentation

## 2013-09-24 DIAGNOSIS — Z7982 Long term (current) use of aspirin: Secondary | ICD-10-CM | POA: Insufficient documentation

## 2013-09-24 DIAGNOSIS — Z8673 Personal history of transient ischemic attack (TIA), and cerebral infarction without residual deficits: Secondary | ICD-10-CM | POA: Insufficient documentation

## 2013-09-24 DIAGNOSIS — Z862 Personal history of diseases of the blood and blood-forming organs and certain disorders involving the immune mechanism: Secondary | ICD-10-CM | POA: Insufficient documentation

## 2013-09-24 DIAGNOSIS — M899 Disorder of bone, unspecified: Secondary | ICD-10-CM | POA: Insufficient documentation

## 2013-09-24 DIAGNOSIS — M19049 Primary osteoarthritis, unspecified hand: Secondary | ICD-10-CM | POA: Insufficient documentation

## 2013-09-24 DIAGNOSIS — I10 Essential (primary) hypertension: Secondary | ICD-10-CM | POA: Insufficient documentation

## 2013-09-24 DIAGNOSIS — M949 Disorder of cartilage, unspecified: Secondary | ICD-10-CM

## 2013-09-24 DIAGNOSIS — R002 Palpitations: Secondary | ICD-10-CM | POA: Insufficient documentation

## 2013-09-24 DIAGNOSIS — Z7902 Long term (current) use of antithrombotics/antiplatelets: Secondary | ICD-10-CM | POA: Insufficient documentation

## 2013-09-24 LAB — BASIC METABOLIC PANEL
BUN: 11 mg/dL (ref 6–23)
CALCIUM: 9 mg/dL (ref 8.4–10.5)
CO2: 23 meq/L (ref 19–32)
CREATININE: 0.72 mg/dL (ref 0.50–1.10)
Chloride: 105 mEq/L (ref 96–112)
GFR calc Af Amer: >90 mL/min (ref >90)
GFR calc non Af Amer: 79 mL/min — ABNORMAL LOW (ref >90)
GLUCOSE: 85 mg/dL (ref 70–99)
Potassium: 4 mEq/L (ref 3.7–5.3)
Sodium: 139 mEq/L (ref 137–147)

## 2013-09-24 LAB — CBC
HCT: 34.1 % — ABNORMAL LOW (ref 36.0–46.0)
HEMOGLOBIN: 11.8 g/dL — AB (ref 12.0–15.0)
MCH: 31.6 pg (ref 26.0–34.0)
MCHC: 34.6 g/dL (ref 30.0–36.0)
MCV: 91.4 fL (ref 78.0–100.0)
PLATELETS: 212 10*3/uL (ref 150–400)
RBC: 3.73 MIL/uL — ABNORMAL LOW (ref 3.87–5.11)
RDW: 13.5 % (ref 11.5–15.5)
WBC: 2.1 10*3/uL — ABNORMAL LOW (ref 4.0–10.5)

## 2013-09-24 NOTE — Discharge Instructions (Signed)
Palpitations   A palpitation is the feeling that your heartbeat is irregular or is faster than normal. It may feel like your heart is fluttering or skipping a beat. Palpitations are usually not a serious problem. However, in some cases, you may need further medical evaluation.  CAUSES   Palpitations can be caused by:   Smoking.   Caffeine or other stimulants, such as diet pills or energy drinks.   Alcohol.   Stress and anxiety.   Strenuous physical activity.   Fatigue.   Certain medicines.   Heart disease, especially if you have a history of arrhythmias. This includes atrial fibrillation, atrial flutter, or supraventricular tachycardia.   An improperly working pacemaker or defibrillator.  DIAGNOSIS   To find the cause of your palpitations, your caregiver will take your history and perform a physical exam. Tests may also be done, including:   Electrocardiography (ECG). This test records the heart's electrical activity.   Cardiac monitoring. This allows your caregiver to monitor your heart rate and rhythm in real time.   Holter monitor. This is a portable device that records your heartbeat and can help diagnose heart arrhythmias. It allows your caregiver to track your heart activity for several days, if needed.   Stress tests by exercise or by giving medicine that makes the heart beat faster.  TREATMENT   Treatment of palpitations depends on the cause of your symptoms and can vary greatly. Most cases of palpitations do not require any treatment other than time, relaxation, and monitoring your symptoms. Other causes, such as atrial fibrillation, atrial flutter, or supraventricular tachycardia, usually require further treatment.  HOME CARE INSTRUCTIONS    Avoid:   Caffeinated coffee, tea, soft drinks, diet pills, and energy drinks.   Chocolate.   Alcohol.   Stop smoking if you smoke.   Reduce your stress and anxiety. Things that can help you relax include:   A method that measures bodily functions so  you can learn to control them (biofeedback).   Yoga.   Meditation.   Physical activity such as swimming, jogging, or walking.   Get plenty of rest and sleep.  SEEK MEDICAL CARE IF:    You continue to have a fast or irregular heartbeat beyond 24 hours.   Your palpitations occur more often.  SEEK IMMEDIATE MEDICAL CARE IF:   You develop chest pain or shortness of breath.   You have a severe headache.   You feel dizzy, or you faint.  MAKE SURE YOU:   Understand these instructions.   Will watch your condition.   Will get help right away if you are not doing well or get worse.  Document Released: 04/10/2000 Document Revised: 08/08/2012 Document Reviewed: 06/12/2011  ExitCare Patient Information 2014 [employer].

## 2013-09-24 NOTE — ED Notes (Signed)
Pt states she can feel her heart "skipping" at night. States she feels very weak and run down. Denies chest pain. Respirations unlabored. Symptoms ongoing x3 weeks, states thumping sensation in her chest last night when she tried to sleep. Lung sounds clear and equal bilaterally. Skin warm and dry.

## 2013-09-24 NOTE — ED Provider Notes (Signed)
CSN: [id number]     Arrival date & time 09/24/13  0715 History   First MD Initiated Contact with Patient 09/24/13 [phone]     Chief Complaint  Patient presents with  . Palpitations      HPI Patient presents with a sensation of palpitations that began this morning.  She is intermittently had this in the past 3 weeks.  She seen her primary care physician who is referred her to cardiology.  She's not been on Holter monitor.  No history of atrial fibrillation.  She is on aspirin and Plavix for history of TIA despite ASA.  No history of cardiac disease.  Denies caffeine intake.  Denies chocolate intake.  Denies over-the-counter medications.  She states she's been compliant with her medications.  No syncope or near-syncope.  Denies chest pain or chest pressure.  No shortness of breath.  These episodes last for several minutes and resolved.  She describes it as more of a irregular heart beat but does not go fast.  Asymptomatic at this time.  These are self-limiting episodes.  She is scheduled to see cardiology on June 19 (Dr Percival Spanish)   Past Medical History  Diagnosis Date  . Hypertension   . Osteopenia 07/2011    T score -2.3 FRAX 6.9%/2.3%  . TIA (transient ischemic attack) 1990's  . High cholesterol     "on RX years ago" (02/17/2013)  . Arthritis     "left hand" (02/17/2013)   Past Surgical History  Procedure Laterality Date  . Tonsillectomy    . Anal fistulectomy  1970   Family History  Problem Relation Age of Onset  . Hypertension Father   . Heart attack Father    History  Substance Use Topics  . Smoking status: Never Smoker   . Smokeless tobacco: Never Used  . Alcohol Use: No   OB History   Grav Para Term Preterm Abortions TAB SAB Ect Mult Living   1 1 1       1      Review of Systems  All other systems reviewed and are negative.     Allergies  Demerol; Sulfa antibiotics; and Codeine  Home Medications   Prior to Admission medications   Medication Sig Start Date End  Date Taking? Authorizing Provider  aspirin (ECOTRIN LOW STRENGTH) 81 MG EC tablet Take 81 mg by mouth daily. Swallow whole.   Yes Historical Provider, MD  cholecalciferol (VITAMIN D) 1000 UNITS tablet Take 1,000 Units by mouth daily.    Yes Historical Provider, MD  clopidogrel (PLAVIX) 75 MG tablet Take 75 mg by mouth daily.   Yes Historical Provider, MD  folic acid (FOLVITE) 1 MG tablet Take 1 mg by mouth daily.   Yes Historical Provider, MD  metoprolol (LOPRESSOR) 50 MG tablet Take 25 mg by mouth 2 (two) times daily. 02/18/13  Yes [name], MD  PRESCRIPTION MEDICATION Apply 1 application topically daily as needed (for hair).   Yes Historical Provider, MD   BP 132/60  Pulse 60  Temp(Src) 97.9 F (36.6 C) (Oral)  Resp 18  SpO2 99% Physical Exam  Nursing note and vitals reviewed. Constitutional: She is oriented to person, place, and time. She appears well-developed and well-nourished. No distress.  HENT:  Head: Normocephalic and atraumatic.  Eyes: EOM are normal.  Neck: Normal range of motion.  Cardiovascular: Normal rate, regular rhythm and normal heart sounds.   Pulmonary/Chest: Effort normal and breath sounds normal.  Abdominal: Soft. She exhibits no distension. There is no  tenderness.  Musculoskeletal: Normal range of motion.  Neurological: She is alert and oriented to person, place, and time.  Skin: Skin is warm and dry.  Psychiatric: She has a normal mood and affect. Judgment normal.    ED Course  Procedures (including critical care time) Labs Review Labs Reviewed  CBC - Abnormal; Notable for the following:    WBC 2.1 (*)    RBC 3.73 (*)    Hemoglobin 11.8 (*)    HCT 34.1 (*)    All other components within normal limits  BASIC METABOLIC PANEL - Abnormal; Notable for the following:    GFR calc non Af Amer 79 (*)    All other components within normal limits    Imaging Review No results found.   EKG Interpretation   Date/Time:  Sunday Sep 24 2013 07:20:16  EDT Ventricular Rate:  72 PR Interval:  130 QRS Duration: 64 QT Interval:  346 QTC Calculation: 378 R Axis:   72 Text Interpretation:  Normal sinus rhythm Septal infarct , age  undetermined Abnormal ECG No significant change was found Confirmed by  Shaquna Geigle  MD, Lennette Bihari ([id number]) on 09/24/2013 7:50:25 AM      MDM   Final diagnoses:  Palpitations    Patient placed on the monitor here in the ER.  I reviewed all of her telemetry strips.  No arrhythmia is noted.  I will schedule the patient for outpatient Holter monitoring.  This has been ordered at [facility].  She'll contact the office in the morning.  She'll keep her appointment with cardiology.  She understands return to the ER for new or worsening symptoms.  I suspect she has occasional PACs or PVCs.  She is at risk for atrial fibrillation.  She is on antiplatelet agent.  Patient and family updated.  Agreeable to outpatient plan.  They understand to return to the ER for new or worsening symptoms    Hoy Morn, MD 09/24/13 [phone]

## 2013-09-24 NOTE — ED Notes (Signed)
Pt. Stated, for about 3 weeks I've been hearing thump thump thump in my heart.  No pain just the thumping.

## 2013-09-25 ENCOUNTER — Other Ambulatory Visit: Payer: Self-pay | Admitting: *Deleted

## 2013-09-25 DIAGNOSIS — I499 Cardiac arrhythmia, unspecified: Secondary | ICD-10-CM

## 2013-09-27 ENCOUNTER — Encounter (INDEPENDENT_AMBULATORY_CARE_PROVIDER_SITE_OTHER): Payer: Medicare Other

## 2013-09-27 ENCOUNTER — Encounter: Payer: Self-pay | Admitting: *Deleted

## 2013-09-27 DIAGNOSIS — I499 Cardiac arrhythmia, unspecified: Secondary | ICD-10-CM

## 2013-09-27 DIAGNOSIS — R002 Palpitations: Secondary | ICD-10-CM

## 2013-09-27 NOTE — Progress Notes (Signed)
Patient ID: Sydney [name], female   DOB: Mar 18, 1931, 80 y.o.   MRN: [id number] E-Cardio 48 hour holter monitor applied to patient.

## 2013-10-13 ENCOUNTER — Ambulatory Visit: Payer: Medicare Other | Admitting: Cardiology

## 2013-10-13 ENCOUNTER — Telehealth: Payer: Self-pay | Admitting: *Deleted

## 2013-10-13 NOTE — Telephone Encounter (Signed)
PT  AWARE  OF MONITOR  RESULTS PER DR HOCHREIN ATRIAL  TACHYCARDIA  WITH   PAC'S ./CY

## 2013-10-17 ENCOUNTER — Encounter: Payer: Self-pay | Admitting: Cardiovascular Disease

## 2013-10-17 ENCOUNTER — Ambulatory Visit (INDEPENDENT_AMBULATORY_CARE_PROVIDER_SITE_OTHER): Payer: Medicare Other | Admitting: Cardiovascular Disease

## 2013-10-17 VITALS — BP 136/70 | HR 65 | Ht 63.5 in | Wt 113.5 lb

## 2013-10-17 DIAGNOSIS — R002 Palpitations: Secondary | ICD-10-CM

## 2013-10-17 DIAGNOSIS — R001 Bradycardia, unspecified: Secondary | ICD-10-CM

## 2013-10-17 DIAGNOSIS — I10 Essential (primary) hypertension: Secondary | ICD-10-CM

## 2013-10-17 DIAGNOSIS — I498 Other specified cardiac arrhythmias: Secondary | ICD-10-CM

## 2013-10-17 NOTE — Patient Instructions (Signed)
We request that you follow-up in: 3 months with an extender and in 6 months with Dr Berry  You will receive a reminder letter in the mail two months in advance. If you don't receive a letter, please call our office to schedule the follow-up appointment.   

## 2013-10-17 NOTE — Assessment & Plan Note (Signed)
More symptomatic recently. A 40 hour Holter monitor showed PACs, occasional PVCs and no runs of limited atrial tachycardia. She also had episodes of bradycardia (tachybradycardia syndrome). At this point I do not think further workup is necessary. Am hesitant to increase her beta blocker because of her episodes of bradycardia. We'll continue to follow her clinically.

## 2013-10-17 NOTE — Assessment & Plan Note (Signed)
Controlled on low-dose beta blocker

## 2013-10-17 NOTE — Progress Notes (Signed)
10/17/2013 Sydney [name]   09-06-1930  [id number]  Primary Physician Sydney Low, MD Primary Cardiologist: Sydney Harp MD Sydney [name]   HPI:  [name] is a pleasant 80 year old thin appearing widowed [place] American female mother of one daughter who is accompanying her today. She was referred by Dr. Deforest [name] from [place] for cardiovascular evaluation because of palpitations. Factors include treated hypertension. She's never had a heart attack but has had apparently a TIA in the past and has documented carotid disease on aspirin Plavix. She denies chest pain or shortness of breath. She said submitted palpitations of her the last month or so and did see someone in the emergency room who ordered a Holter monitor. This revealed PACs, PVCs and occasional runs of atrial tachycardia. She is on [name]-dose beta blocker.   Current Outpatient Prescriptions  Medication Sig Dispense Refill  . aspirin (ECOTRIN [name] STRENGTH) 81 MG EC tablet Take 81 mg by mouth daily. Swallow whole.      . cholecalciferol (VITAMIN D) 1000 UNITS tablet Take 1,000 Units by mouth daily.       . clopidogrel (PLAVIX) 75 MG tablet Take 75 mg by mouth daily.      . folic acid (FOLVITE) 1 MG tablet Take 1 mg by mouth daily.      . metoprolol (LOPRESSOR) 50 MG tablet Take 25 mg by mouth 2 (two) times daily.      Marland Kitchen PRESCRIPTION MEDICATION Apply 1 application topically daily as needed (for hair).       No current facility-administered medications for this visit.    Allergies  Allergen Reactions  . Demerol [Meperidine] Other (See Comments)    Excessive sweating, cramping  . Sulfa Antibiotics Other (See Comments)    Reaction unknown  . Codeine Rash    History   Social History  . Marital Status: Widowed    Spouse Name: N/A    Number of Children: N/A  . Years of Education: N/A   Occupational History  . Not on file.   Social History Main Topics  . Smoking status: Never Smoker   . Smokeless  tobacco: Never Used  . Alcohol Use: No  . Drug Use: No  . Sexual Activity: No   Other Topics Concern  . Not on file   Social History Narrative  . No narrative on file     Review of Systems: General: negative for chills, fever, night sweats or weight changes.  Cardiovascular: negative for chest pain, dyspnea on exertion, edema, orthopnea, palpitations, paroxysmal nocturnal dyspnea or shortness of breath Dermatological: negative for rash Respiratory: negative for cough or wheezing Urologic: negative for hematuria Abdominal: negative for nausea, vomiting, diarrhea, bright red blood per rectum, melena, or hematemesis Neurologic: negative for visual changes, syncope, or dizziness All other systems reviewed and are otherwise negative except as noted above.    Blood pressure 136/70, pulse 65, height 5' 3.5" (1.613 m), weight 113 lb 8 oz (51.483 kg).  General appearance: alert and no distress Neck: no adenopathy, no carotid bruit, no JVD, supple, symmetrical, trachea midline and thyroid not enlarged, symmetric, no tenderness/mass/nodules Lungs: clear to auscultation bilaterally Heart: regular rate and rhythm, S1, S2 normal, no murmur, click, rub or gallop Extremities: extremities normal, atraumatic, no cyanosis or edema  EKG normal sinus rhythm at 65 with nonspecific ST and T wave changes  ASSESSMENT AND PLAN:   HTN (hypertension) Controlled on [name]-dose beta blocker  Palpitations More symptomatic recently. A 40 hour Holter monitor showed PACs,  occasional PVCs and no runs of limited atrial tachycardia. She also had episodes of bradycardia (tachybradycardia syndrome). At this point I do not think further workup is necessary. Am hesitant to increase her beta blocker because of her episodes of bradycardia. We'll continue to follow her clinically.      Sydney Harp MD FACP,FACC,FAHA, [facility] 10/17/2013 11:52 AM

## 2013-10-23 ENCOUNTER — Ambulatory Visit: Payer: Medicare Other | Admitting: Cardiology

## 2013-10-23 ENCOUNTER — Ambulatory Visit
Admission: RE | Admit: 2013-10-23 | Discharge: 2013-10-23 | Disposition: A | Discharge status: Home / Self Care | Payer: Medicare Other | Source: Ambulatory Visit

## 2013-10-23 DIAGNOSIS — Z1231 Encounter for screening mammogram for malignant neoplasm of breast: Secondary | ICD-10-CM

## 2013-11-02 ENCOUNTER — Ambulatory Visit: Payer: Medicare Other | Admitting: Internal Medicine

## 2014-01-03 ENCOUNTER — Ambulatory Visit (INDEPENDENT_AMBULATORY_CARE_PROVIDER_SITE_OTHER): Payer: Medicare Other | Admitting: Cardiology

## 2014-01-03 ENCOUNTER — Encounter: Payer: Self-pay | Admitting: Cardiology

## 2014-01-03 VITALS — BP 120/64 | HR 66 | Ht 63.0 in | Wt 108.9 lb

## 2014-01-03 DIAGNOSIS — I6529 Occlusion and stenosis of unspecified carotid artery: Secondary | ICD-10-CM

## 2014-01-03 DIAGNOSIS — I10 Essential (primary) hypertension: Secondary | ICD-10-CM

## 2014-01-03 DIAGNOSIS — Z79899 Other long term (current) drug therapy: Secondary | ICD-10-CM

## 2014-01-03 DIAGNOSIS — R002 Palpitations: Secondary | ICD-10-CM

## 2014-01-03 LAB — BASIC METABOLIC PANEL
BUN: 11 mg/dL (ref 6–23)
CO2: 24 mEq/L (ref 19–32)
Calcium: 9.2 mg/dL (ref 8.4–10.5)
Chloride: 106 mEq/L (ref 96–112)
Creat: 0.67 mg/dL (ref 0.50–1.10)
Glucose, Bld: 87 mg/dL (ref 70–99)
Potassium: 4.1 mEq/L (ref 3.5–5.3)
Sodium: 136 mEq/L (ref 135–145)

## 2014-01-03 LAB — MAGNESIUM: Magnesium: 1.9 mg/dL (ref 1.5–2.5)

## 2014-01-03 MED ORDER — METOPROLOL TARTRATE 25 MG PO TABS: ORAL_TABLET | ORAL | Status: DC

## 2014-01-03 NOTE — Patient Instructions (Signed)
Your physician recommends that you schedule a follow-up appointment in: December with Dr Gwenlyn Found  Your physician has recommended you make the following change in your medication: Stop Metoprolol 50 mg and Take Metoprolol 25 mg in the Morning and 1 1/2 tablets in the evening  Your physician recommends that you return for lab work in: Today BMP and Magnesium

## 2014-01-03 NOTE — Progress Notes (Signed)
01/04/2014   PCP: [name], MD   Chief Complaint  Patient presents with  . 3 mo rov    patient felt heart beating last night. she now says that she is sleepng better.    Primary Cardiologist: Dr. Adora Fridge   HPI:  80 year old thin appearing widowed African American female mother of one daughter who is accompanying her today. She was referred by Dr. Deforest Hoyles from [facility] for cardiovascular evaluation because of palpitations. Factors include treated hypertension. She's never had a heart attack but has had apparently a TIA in the past and has documented carotid disease on aspirin Plavix. She denies chest pain or shortness of breath. She said intermittant palpitations over the month prior to seeing Dr. Adora Fridge - did see someone in the emergency room who ordered a Holter monitor. This revealed PACs, PVCs and occasional runs of atrial tachycardia. She did have occ bradycardia on the holter down to 48. She is on low-dose beta blocker.   Continues with palpitations occuring mostly at night.  The palpitations bother her at night.  She stated she was tired during the day but this past weekend she was out of town on 2 different trips then in church all day Sunday.  So she stays busy despite feeling tired.    She also complains of drawing of her fingers on her lt hand.  No pain just the fingers draw inward.     Allergies  Allergen Reactions  . Demerol [Meperidine] Other (See Comments)    Excessive sweating, cramping  . Sulfa Antibiotics Other (See Comments)    Reaction unknown  . Codeine Rash    Current Outpatient Prescriptions  Medication Sig Dispense Refill  . aspirin (ECOTRIN LOW STRENGTH) 81 MG EC tablet Take 81 mg by mouth daily. Swallow whole.      . cholecalciferol (VITAMIN D) 1000 UNITS tablet Take 1,000 Units by mouth daily.       . clopidogrel (PLAVIX) 75 MG tablet Take 75 mg by mouth daily.      . folic acid (FOLVITE) 1 MG tablet Take 1 mg by mouth daily.      Marland Kitchen  PRESCRIPTION MEDICATION Apply 1 application topically daily as needed (for hair).      Marland Kitchen ketoconazole (NIZORAL) 2 % shampoo Apply 1 application topically daily.      . metoprolol tartrate (LOPRESSOR) 25 MG tablet Take 1 tablets in morning and 1 1/2 tablets in the evening  75 tablet  6   No current facility-administered medications for this visit.    Past Medical History  Diagnosis Date  . Hypertension   . Osteopenia 07/2011    T score -2.3 FRAX 6.9%/2.3%  . TIA (transient ischemic attack) 1990's  . High cholesterol     "on RX years ago" (02/17/2013)  . Arthritis     "left hand" (02/17/2013)  . Palpitations     PACs, PVCs and short runs of atrial tachycardia on Holter monitoring    Past Surgical History  Procedure Laterality Date  . Tonsillectomy    . Anal fistulectomy  1970    XNA:TFTDDUK:GU colds or fevers, no weight changes Skin:no rashes or ulcers HEENT:no blurred vision, no congestion CV:see HPI PUL:see HPI GI:no diarrhea constipation or melena, no indigestion GU:no hematuria, no dysuria MS:no joint pain, no claudication Neuro:no syncope, no lightheadedness Endo:no diabetes, no thyroid disease  Wt Readings from Last 3 Encounters:  01/03/14 108 lb 14.4 oz (49.397 kg)  10/17/13  113 lb 8 oz (51.483 kg)  02/22/13 107 lb (48.535 kg)    PHYSICAL EXAM BP 120/64  Pulse 66  Ht 5\' 3"  (1.6 m)  Wt 108 lb 14.4 oz (49.397 kg)  BMI 19.30 kg/m2 General:Pleasant affect, NAD Skin:Warm and dry, brisk capillary refill HEENT:normocephalic, sclera clear, mucus membranes moist Neck:supple, no JVD, no bruits  Heart:S1S2 RRR without murmur, gallup, rub or click Lungs:clear without rales, rhonchi, or wheezes IOX:BDZH, non tender, + BS, do not palpate liver spleen or masses Ext:no lower ext edema, 2+ pedal pulses, 2+ radial pulses Neuro:alert and oriented, MAE, follows commands, + facial symmetry EKG:SR with arrhythmia, PACs non speckifc T wave anb  ASSESSMENT AND  PLAN Palpitations Continues with episodes of atrial tach, she is bothered by them mostly at night.  I discussed with Dr. Adora Fridge and I will increase the lopressor to 25 mg in the am and 37.5 mg at pm.  Do not wish to increase a great deal.  Did discuss with pt and her daughter that at times this type of "tachy brady syndrome" leads to a pacemaker.  Hopefully the small increase of BB will control tachycardia and not increase bradycardia.  We called in new 25 mg tabs of lopressor.    HTN (hypertension) Controlled.  Occlusion and stenosis of carotid artery without mention of cerebral infarction stable

## 2014-01-04 NOTE — Assessment & Plan Note (Signed)
Continues with episodes of atrial tach, she is bothered by them mostly at night.  I discussed with Dr. Adora Fridge and I will increase the lopressor to 25 mg in the am and 37.5 mg at pm.  Do not wish to increase a great deal.  Did discuss with pt and her daughter that at times this type of "tachy brady syndrome" leads to a pacemaker.  Hopefully the small increase of BB will control tachycardia and not increase bradycardia.  We called in new 25 mg tabs of lopressor.

## 2014-01-04 NOTE — Assessment & Plan Note (Signed)
stable °

## 2014-01-04 NOTE — Assessment & Plan Note (Signed)
Controlled.  

## 2014-01-05 ENCOUNTER — Encounter: Payer: Self-pay | Admitting: *Deleted

## 2014-01-10 ENCOUNTER — Telehealth: Payer: Self-pay | Admitting: Cardiology

## 2014-01-10 NOTE — Telephone Encounter (Signed)
Pt's daughter called. Her mother is "weak" in the morning after taking increased metoprolol though her B/P is 130/70 and HR 74. I suggested she go back to her previous dose of metoprolol.  Kerin Ransom PA-C 01/10/2014 7:56 AM

## 2014-01-11 ENCOUNTER — Emergency Department (HOSPITAL_COMMUNITY): Payer: Medicare Other

## 2014-01-11 ENCOUNTER — Emergency Department (HOSPITAL_COMMUNITY)
Admission: EM | Admit: 2014-01-11 | Discharge: 2014-01-11 | Disposition: A | Discharge status: Home / Self Care | Payer: Medicare Other | Attending: Emergency Medicine | Admitting: Emergency Medicine

## 2014-01-11 ENCOUNTER — Encounter (HOSPITAL_COMMUNITY): Payer: Self-pay | Admitting: Emergency Medicine

## 2014-01-11 DIAGNOSIS — R51 Headache: Secondary | ICD-10-CM | POA: Diagnosis present

## 2014-01-11 DIAGNOSIS — R5381 Other malaise: Secondary | ICD-10-CM | POA: Diagnosis not present

## 2014-01-11 DIAGNOSIS — E7800 Pure hypercholesterolemia, unspecified: Secondary | ICD-10-CM | POA: Insufficient documentation

## 2014-01-11 DIAGNOSIS — H748X9 Other specified disorders of middle ear and mastoid, unspecified ear: Secondary | ICD-10-CM | POA: Insufficient documentation

## 2014-01-11 DIAGNOSIS — M899 Disorder of bone, unspecified: Secondary | ICD-10-CM | POA: Insufficient documentation

## 2014-01-11 DIAGNOSIS — R52 Pain, unspecified: Secondary | ICD-10-CM | POA: Diagnosis not present

## 2014-01-11 DIAGNOSIS — IMO0002 Reserved for concepts with insufficient information to code with codable children: Secondary | ICD-10-CM | POA: Insufficient documentation

## 2014-01-11 DIAGNOSIS — M19049 Primary osteoarthritis, unspecified hand: Secondary | ICD-10-CM | POA: Insufficient documentation

## 2014-01-11 DIAGNOSIS — H7491 Unspecified disorder of right middle ear and mastoid: Secondary | ICD-10-CM

## 2014-01-11 DIAGNOSIS — Z7902 Long term (current) use of antithrombotics/antiplatelets: Secondary | ICD-10-CM | POA: Insufficient documentation

## 2014-01-11 DIAGNOSIS — Z8673 Personal history of transient ischemic attack (TIA), and cerebral infarction without residual deficits: Secondary | ICD-10-CM | POA: Diagnosis not present

## 2014-01-11 DIAGNOSIS — J3489 Other specified disorders of nose and nasal sinuses: Secondary | ICD-10-CM | POA: Insufficient documentation

## 2014-01-11 DIAGNOSIS — Z7982 Long term (current) use of aspirin: Secondary | ICD-10-CM | POA: Diagnosis not present

## 2014-01-11 DIAGNOSIS — M949 Disorder of cartilage, unspecified: Secondary | ICD-10-CM | POA: Diagnosis not present

## 2014-01-11 DIAGNOSIS — I10 Essential (primary) hypertension: Secondary | ICD-10-CM | POA: Insufficient documentation

## 2014-01-11 DIAGNOSIS — R519 Headache, unspecified: Secondary | ICD-10-CM

## 2014-01-11 DIAGNOSIS — R5383 Other fatigue: Secondary | ICD-10-CM

## 2014-01-11 LAB — COMPREHENSIVE METABOLIC PANEL
ALBUMIN: 3.3 g/dL — AB (ref 3.5–5.2)
ALT: 38 U/L — ABNORMAL HIGH (ref 0–35)
AST: 42 U/L — AB (ref 0–37)
Alkaline Phosphatase: 133 U/L — ABNORMAL HIGH (ref 39–117)
Anion gap: 12 (ref 5–15)
BILIRUBIN TOTAL: 0.5 mg/dL (ref 0.3–1.2)
BUN: 11 mg/dL (ref 6–23)
CO2: 24 mEq/L (ref 19–32)
CREATININE: 0.66 mg/dL (ref 0.50–1.10)
Calcium: 9.1 mg/dL (ref 8.4–10.5)
Chloride: 101 mEq/L (ref 96–112)
GFR calc Af Amer: >90 mL/min (ref >90)
GFR calc non Af Amer: 81 mL/min — ABNORMAL LOW (ref >90)
Glucose, Bld: 119 mg/dL — ABNORMAL HIGH (ref 70–99)
POTASSIUM: 3.7 meq/L (ref 3.7–5.3)
Sodium: 137 mEq/L (ref 137–147)
TOTAL PROTEIN: 7.2 g/dL (ref 6.0–8.3)

## 2014-01-11 LAB — CBC WITH DIFFERENTIAL/PLATELET
BASOS ABS: 0 10*3/uL (ref 0.0–0.1)
BASOS PCT: 0 % (ref 0–1)
Eosinophils Absolute: 0 10*3/uL (ref 0.0–0.7)
Eosinophils Relative: 0 % (ref 0–5)
HCT: 37.2 % (ref 36.0–46.0)
Hemoglobin: 12.8 g/dL (ref 12.0–15.0)
Lymphocytes Relative: 22 % (ref 12–46)
Lymphs Abs: 1.3 10*3/uL (ref 0.7–4.0)
MCH: 31.5 pg (ref 26.0–34.0)
MCHC: 34.4 g/dL (ref 30.0–36.0)
MCV: 91.6 fL (ref 78.0–100.0)
MONO ABS: 0.6 10*3/uL (ref 0.1–1.0)
Monocytes Relative: 9 % (ref 3–12)
NEUTROS ABS: 4.2 10*3/uL (ref 1.7–7.7)
NEUTROS PCT: 69 % (ref 43–77)
Platelets: 239 10*3/uL (ref 150–400)
RBC: 4.06 MIL/uL (ref 3.87–5.11)
RDW: 13 % (ref 11.5–15.5)
WBC: 6 10*3/uL (ref 4.0–10.5)

## 2014-01-11 MED ORDER — FLUTICASONE PROPIONATE 50 MCG/ACT NA SUSP: 2.0000 | Freq: Every day | NASAL | Status: DC

## 2014-01-11 NOTE — Discharge Instructions (Signed)
Your CT scan today did not show any signs of tumor or bleeding.  Your right middle ear and mastoid showed excess fluid.  Begin using flonase.  Continue using claritin.  Follow up with your doctor or ENT for recheck in 1 week.  Return to the ER for worsening condition or new concerning symptoms.   General Headache Without Cause A headache is pain or discomfort felt around the head or neck area. The specific cause of a headache may not be found. There are many causes and types of headaches. A few common ones are:  Tension headaches.  Migraine headaches.  Cluster headaches.  Chronic daily headaches. HOME CARE INSTRUCTIONS   Keep all follow-up appointments with your caregiver or any specialist referral.  Only take over-the-counter or prescription medicines for pain or discomfort as directed by your caregiver.  Lie down in a dark, quiet room when you have a headache.  Keep a headache journal to find out what may trigger your migraine headaches. For example, write down:  What you eat and drink.  How much sleep you get.  Any change to your diet or medicines.  Try massage or other relaxation techniques.  Put ice packs or heat on the head and neck. Use these 3 to 4 times per day for 15 to 20 minutes each time, or as needed.  Limit stress.  Sit up straight, and do not tense your muscles.  Quit smoking if you smoke.  Limit alcohol use.  Decrease the amount of caffeine you drink, or stop drinking caffeine.  Eat and sleep on a regular schedule.  Get 7 to 9 hours of sleep, or as recommended by your caregiver.  Keep lights dim if bright lights bother you and make your headaches worse. SEEK MEDICAL CARE IF:   You have problems with the medicines you were prescribed.  Your medicines are not working.  You have a change from the usual headache.  You have nausea or vomiting. SEEK IMMEDIATE MEDICAL CARE IF:   Your headache becomes severe.  You have a fever.  You have a stiff  neck.  You have loss of vision.  You have muscular weakness or loss of muscle control.  You start losing your balance or have trouble walking.  You feel faint or pass out.  You have severe symptoms that are different from your first symptoms. MAKE SURE YOU:   Understand these instructions.  Will watch your condition.  Will get help right away if you are not doing well or get worse. Document Released: 04/13/2005 Document Revised: 07/06/2011 Document Reviewed: 04/29/2011 [facility] Patient Information 2015 [place], [place]. This information is not intended to replace advice given to you by your health care provider. Make sure you discuss any questions you have with your health care provider.

## 2014-01-11 NOTE — ED Notes (Signed)
PT monitored by pulse ox, bp cuff, and 5-lead. 

## 2014-01-11 NOTE — ED Notes (Addendum)
Pt. reports frontal headache onset this evening , denies injury , no nausea or vomitting , denies fever .

## 2014-01-11 NOTE — ED Notes (Signed)
Patient transported to CT 

## 2014-01-11 NOTE — ED Provider Notes (Signed)
CSN: [id number]     Arrival date & time 01/11/14  0044 History   First MD Initiated Contact with Patient 01/11/14 [phone]     Chief Complaint  Patient presents with  . Headache     (Consider location/radiation/quality/duration/timing/severity/associated sxs/prior Treatment) HPI 80 yo female presents to the ER from home with complaint of global headache this evening around 11 pm.  Pt was asleep at onset.  Pt reports she does not normally get headaches.  Pt recently had change in BP medication-was increased to 1.5 tab of metoprolol last week by cardiologist, told today due to her side effects could return back to original dosing.  Pt was having weakness, fatigue.  Pt saw PCM today who recommended holding dose tonight, and also starting on Claritin for congestion sxs.  Pt denies fevers, chills, n/v/d, weakness, numbness or other symptoms.  HA has eased since onset.  Past Medical History  Diagnosis Date  . Hypertension   . Osteopenia 07/2011    T score -2.3 FRAX 6.9%/2.3%  . TIA (transient ischemic attack) 1990's  . High cholesterol     "on RX years ago" (02/17/2013)  . Arthritis     "left hand" (02/17/2013)  . Palpitations     PACs, PVCs and short runs of atrial tachycardia on Holter monitoring   Past Surgical History  Procedure Laterality Date  . Tonsillectomy    . Anal fistulectomy  1970   Family History  Problem Relation Age of Onset  . Hypertension Father   . Heart attack Father   . Stroke Brother   . Stroke Maternal Grandmother    History  Substance Use Topics  . Smoking status: Never Smoker   . Smokeless tobacco: Never Used  . Alcohol Use: No   OB History   Grav Para Term Preterm Abortions TAB SAB Ect Mult Living   1 1 1       1      Review of Systems  All other systems reviewed and are negative.     Allergies  Demerol; Sulfa antibiotics; and Codeine  Home Medications   Prior to Admission medications   Medication Sig Start Date End Date Taking? Authorizing  Provider  aspirin (ECOTRIN LOW STRENGTH) 81 MG EC tablet Take 81 mg by mouth daily. Swallow whole.   Yes Historical Provider, MD  cholecalciferol (VITAMIN D) 1000 UNITS tablet Take 1,000 Units by mouth daily.    Yes Historical Provider, MD  clopidogrel (PLAVIX) 75 MG tablet Take 75 mg by mouth daily.   Yes Historical Provider, MD  folic acid (FOLVITE) 1 MG tablet Take 1 mg by mouth daily.   Yes Historical Provider, MD  ketoconazole (NIZORAL) 2 % shampoo Apply 1 application topically daily as needed for irritation.  12/19/13  Yes Historical Provider, MD  loratadine (CLARITIN) 10 MG tablet Take 10 mg by mouth daily as needed for allergies.   Yes Historical Provider, MD  metoprolol tartrate (LOPRESSOR) 25 MG tablet Take 25 mg by mouth 2 (two) times daily.   Yes Historical Provider, MD  fluticasone (FLONASE) 50 MCG/ACT nasal spray Place 2 sprays into both nostrils daily. 01/11/14   Kalman Drape, MD   BP 131/68  Pulse 82  Temp(Src) 98.1 F (36.7 C) (Oral)  Resp 15  Ht 5\' 3"  (1.6 m)  Wt 108 lb (48.988 kg)  BMI 19.14 kg/m2  SpO2 100% Physical Exam  Nursing note and vitals reviewed. Constitutional: She is oriented to person, place, and time. She appears well-developed and well-nourished.  HENT:  Head: Normocephalic and atraumatic.  Right Ear: External ear normal.  Left Ear: External ear normal.  Nose: Nose normal.  Mouth/Throat: Oropharynx is clear and moist.  Eyes: Conjunctivae and EOM are normal. Pupils are equal, round, and reactive to light.  Neck: Normal range of motion. Neck supple. No JVD present. No tracheal deviation present. No thyromegaly present.  Cardiovascular: Normal rate, regular rhythm, normal heart sounds and intact distal pulses.  Exam reveals no gallop and no friction rub.   No murmur heard. Pulmonary/Chest: Effort normal and breath sounds normal. No stridor. No respiratory distress. She has no wheezes. She has no rales. She exhibits no tenderness.  Abdominal: Soft. Bowel  sounds are normal. She exhibits no distension and no mass. There is no tenderness. There is no rebound and no guarding.  Musculoskeletal: Normal range of motion. She exhibits no edema and no tenderness.  Lymphadenopathy:    She has no cervical adenopathy.  Neurological: She is alert and oriented to person, place, and time. She displays normal reflexes. No cranial nerve deficit. She exhibits normal muscle tone. Coordination normal.  Skin: Skin is warm and dry. No rash noted. No erythema. No pallor.  Psychiatric: She has a normal mood and affect. Her behavior is normal. Judgment and thought content normal.    ED Course  Procedures (including critical care time) Labs Review Labs Reviewed  COMPREHENSIVE METABOLIC PANEL - Abnormal; Notable for the following:    Glucose, Bld 119 (*)    Albumin 3.3 (*)    AST 42 (*)    ALT 38 (*)    Alkaline Phosphatase 133 (*)    GFR calc non Af Amer 81 (*)    All other components within normal limits  CBC WITH DIFFERENTIAL    Imaging Review Ct Head Wo Contrast  01/11/2014   CLINICAL DATA:  Severe headache  EXAM: CT HEAD WITHOUT CONTRAST  TECHNIQUE: Contiguous axial images were obtained from the base of the skull through the vertex without intravenous contrast.  COMPARISON:  Prior CT from 12/06/2012  FINDINGS: Atrophy with chronic small ischemic disease again seen, stable from prior.  There is no acute intracranial hemorrhage or infarct. No mass lesion or midline shift. Gray-white matter differentiation is well maintained. Ventricles are normal in size without evidence of hydrocephalus. CSF containing spaces are within normal limits. No extra-axial fluid collection.  The calvarium is intact.  Orbital soft tissues are within normal limits.  The paranasal sinuses are well pneumatized and free of fluid. There is complete opacification of the right mastoid air cells with fluid density within the right middle ear cavity.  Scalp soft tissues are unremarkable.   IMPRESSION: 1. No acute intracranial abnormality. 2. Right mastoid effusion with soft tissue density within the right middle ear cavity. Findings may reflect acute otomastoiditis.   Electronically Signed   By: Jeannine Boga M.D.   On: 01/11/2014 04:34     EKG Interpretation None      MDM   Final diagnoses:  Acute nonintractable headache, unspecified headache type  Middle ear and mastoid disorder, right    80 yo female with acute headache.  Mastoid air cells on right opacified, but no pain on that side.  Will start on flonase, refer to ENT.    Kalman Drape, MD 01/11/14 [phone]

## 2014-01-16 ENCOUNTER — Telehealth: Payer: Self-pay | Admitting: Cardiovascular Disease

## 2014-01-16 NOTE — Telephone Encounter (Signed)
Closed encounter °

## 2014-02-25 DIAGNOSIS — M810 Age-related osteoporosis without current pathological fracture: Secondary | ICD-10-CM

## 2014-02-25 HISTORY — DX: Age-related osteoporosis without current pathological fracture: M81.0

## 2014-02-26 ENCOUNTER — Encounter (HOSPITAL_COMMUNITY): Payer: Self-pay | Admitting: Emergency Medicine

## 2014-02-27 ENCOUNTER — Encounter: Payer: Self-pay | Admitting: Gynecology

## 2014-02-27 ENCOUNTER — Telehealth: Payer: Self-pay | Admitting: *Deleted

## 2014-02-27 ENCOUNTER — Ambulatory Visit (INDEPENDENT_AMBULATORY_CARE_PROVIDER_SITE_OTHER): Payer: Medicare Other | Admitting: Gynecology

## 2014-02-27 VITALS — BP 134/70 | Ht 63.0 in | Wt 109.0 lb

## 2014-02-27 DIAGNOSIS — R2231 Localized swelling, mass and lump, right upper limb: Secondary | ICD-10-CM

## 2014-02-27 DIAGNOSIS — M8580 Other specified disorders of bone density and structure, unspecified site: Secondary | ICD-10-CM

## 2014-02-27 DIAGNOSIS — I6529 Occlusion and stenosis of unspecified carotid artery: Secondary | ICD-10-CM

## 2014-02-27 DIAGNOSIS — N952 Postmenopausal atrophic vaginitis: Secondary | ICD-10-CM

## 2014-02-27 DIAGNOSIS — N63 Unspecified lump in unspecified breast: Secondary | ICD-10-CM

## 2014-02-27 NOTE — Progress Notes (Signed)
Sydney [name] 1930-09-03 [id number]        80 y.o.  G1P1001 for follow up exam. Several issues noted below.  Past medical history,surgical history, problem list, medications, allergies, family history and social history were all reviewed and documented as reviewed in the EPIC chart.  ROS:  12 system ROS performed with pertinent positives and negatives included in the history, assessment and plan.   Additional significant findings :  none   Exam: Kim Counsellor Vitals:   02/27/14 0922  BP: 134/70  Height: 5\' 3"  (1.6 m)  Weight: 109 lb (49.442 kg)   General appearance:  Normal affect, orientation and appearance. Skin: Grossly normal HEENT: Without gross lesions.  No cervical or supraclavicular adenopathy. Thyroid normal.  Lungs:  Clear without wheezing, rales or rhonchi Cardiac: RR, without RMG Abdominal:  Soft, nontender, without masses, guarding, rebound, organomegaly or hernia Breasts:  Examined lying and sitting without masses, retractions, discharge or axillary adenopathy.  Small 1 cm subcutaneous freely mobile firm nodule mid right anterior axillary line. Pelvic:  Ext/BUS/vagina with generalized atrophic changes  Cervix flush with upper vagina, atrophic in appearance  Uterus difficult to palpate but grossly normal size, midline and mobile nontender   Adnexa  Without masses or tenderness    Anus and perineum  Normal   Rectovaginal  Normal sphincter tone without palpated masses or tenderness.    Assessment/Plan:  80 y.o. G1P1001 female for follow up exam.   1. Small subcutaneous nodule right mid anterior axillary line. Feels to be a small sebaceous cyst possible small lymph node. No other abnormalities on breast exam. Mammography 09/2013 normal. Will start with diagnostic right mammogram and ultrasound of this area. Patient knows importance of follow up and that we will help her arrange this appointment. 2. Postmenopausal/atrophic genital changes. Patient without  significant symptoms of hot flushes, night sweats, vaginal dryness. No vaginal bleeding. Continue to monitor. Report any vaginal bleeding. 3. Osteopenia. DEXA 07/2011 T score -2.3 FRAX 6.9%/2.3%. She has this arranged through her primary physician's office and said that she is having this done at the end of the year. Will follow up for this. Increase calcium and vitamin D recommendations reviewed. 4. Pap smear 2013. No Pap smear done today. No history of abnormal Pap smears previously. Reviewed options to stop screening per current screening guidelines as she is over the age of 44. She is comfortable with this. 5. Colonoscopy 6 years ago. Repeat at their recommended interval. 6. Health maintenance. No routine blood work done and she reports this done at her primary physician's office. Follow up for the mammogram/ultrasound otherwise annually.     [name] MD, 9:43 AM 02/27/2014

## 2014-02-27 NOTE — Telephone Encounter (Signed)
Orders placed for breast center they will contact pt to schedule.

## 2014-02-27 NOTE — Telephone Encounter (Signed)
-----   Message from [name], MD sent at 02/27/2014  9:42 AM EST ----- Schedule diagnostic mammogram and ultrasound reference new right tail of Spence/axillary nodule, mid anterior axillary line

## 2014-02-27 NOTE — Patient Instructions (Signed)
Office will call you to arrange the mammogram/ultrasound.  You may obtain a copy of any labs that were done today by logging onto MyChart as outlined in the instructions provided with your AVS (after visit summary). The office will not call with normal lab results but certainly if there are any significant abnormalities then we will contact you.   Health Maintenance, Female A healthy lifestyle and preventative care can promote health and wellness.  Maintain regular health, dental, and eye exams.  Eat a healthy diet. Foods like vegetables, fruits, whole grains, low-fat dairy products, and lean protein foods contain the nutrients you need without too many calories. Decrease your intake of foods high in solid fats, added sugars, and salt. Get information about a proper diet from your caregiver, if necessary.  Regular physical exercise is one of the most important things you can do for your health. Most adults should get at least 150 minutes of moderate-intensity exercise (any activity that increases your heart rate and causes you to sweat) each week. In addition, most adults need muscle-strengthening exercises on 2 or more days a week.   Maintain a healthy weight. The body mass index (BMI) is a screening tool to identify possible weight problems. It provides an estimate of body fat based on height and weight. Your caregiver can help determine your BMI, and can help you achieve or maintain a healthy weight. For adults 20 years and older:  A BMI below 18.5 is considered underweight.  A BMI of 18.5 to 24.9 is normal.  A BMI of 25 to 29.9 is considered overweight.  A BMI of 30 and above is considered obese.  Maintain normal blood lipids and cholesterol by exercising and minimizing your intake of saturated fat. Eat a balanced diet with plenty of fruits and vegetables. Blood tests for lipids and cholesterol should begin at age 9 and be repeated every 5 years. If your lipid or cholesterol levels are  high, you are over 50, or you are a high risk for heart disease, you may need your cholesterol levels checked more frequently.Ongoing high lipid and cholesterol levels should be treated with medicines if diet and exercise are not effective.  If you smoke, find out from your caregiver how to quit. If you do not use tobacco, do not start.  Lung cancer screening is recommended for adults aged 44 80 years who are at high risk for developing lung cancer because of a history of smoking. Yearly low-dose computed tomography (CT) is recommended for people who have at least a 30-pack-year history of smoking and are a current smoker or have quit within the past 15 years. A pack year of smoking is smoking an average of 1 pack of cigarettes a day for 1 year (for example: 1 pack a day for 30 years or 2 packs a day for 15 years). Yearly screening should continue until the smoker has stopped smoking for at least 15 years. Yearly screening should also be stopped for people who develop a health problem that would prevent them from having lung cancer treatment.  If you are pregnant, do not drink alcohol. If you are breastfeeding, be very cautious about drinking alcohol. If you are not pregnant and choose to drink alcohol, do not exceed 1 drink per day. One drink is considered to be 12 ounces (355 mL) of beer, 5 ounces (148 mL) of wine, or 1.5 ounces (44 mL) of liquor.  Avoid use of street drugs. Do not share needles with anyone. Ask for  help if you need support or instructions about stopping the use of drugs.  High blood pressure causes heart disease and increases the risk of stroke. Blood pressure should be checked at least every 1 to 2 years. Ongoing high blood pressure should be treated with medicines, if weight loss and exercise are not effective.  If you are 70 to 78 years old, ask your caregiver if you should take aspirin to prevent strokes.  Diabetes screening involves taking a blood sample to check your fasting  blood sugar level. This should be done once every 3 years, after age 58, if you are within normal weight and without risk factors for diabetes. Testing should be considered at a younger age or be carried out more frequently if you are overweight and have at least 1 risk factor for diabetes.  Breast cancer screening is essential preventative care for women. You should practice "breast self-awareness." This means understanding the normal appearance and feel of your breasts and may include breast self-examination. Any changes detected, no matter how small, should be reported to a caregiver. Women in their 62s and 30s should have a clinical breast exam (CBE) by a caregiver as part of a regular health exam every 1 to 3 years. After age 87, women should have a CBE every year. Starting at age 50, women should consider having a mammogram (breast X-ray) every year. Women who have a family history of breast cancer should talk to their caregiver about genetic screening. Women at a high risk of breast cancer should talk to their caregiver about having an MRI and a mammogram every year.  Breast cancer gene (BRCA)-related cancer risk assessment is recommended for women who have family members with BRCA-related cancers. BRCA-related cancers include breast, ovarian, tubal, and peritoneal cancers. Having family members with these cancers may be associated with an increased risk for harmful changes (mutations) in the breast cancer genes BRCA1 and BRCA2. Results of the assessment will determine the need for genetic counseling and BRCA1 and BRCA2 testing.  The Pap test is a screening test for cervical cancer. Women should have a Pap test starting at age 48. Between ages 48 and 68, Pap tests should be repeated every 2 years. Beginning at age 52, you should have a Pap test every 3 years as long as the past 3 Pap tests have been normal. If you had a hysterectomy for a problem that was not cancer or a condition that could lead to  cancer, then you no longer need Pap tests. If you are between ages 8 and 82, and you have had normal Pap tests going back 10 years, you no longer need Pap tests. If you have had past treatment for cervical cancer or a condition that could lead to cancer, you need Pap tests and screening for cancer for at least 20 years after your treatment. If Pap tests have been discontinued, risk factors (such as a new sexual partner) need to be reassessed to determine if screening should be resumed. Some women have medical problems that increase the chance of getting cervical cancer. In these cases, your caregiver may recommend more frequent screening and Pap tests.  The human papillomavirus (HPV) test is an additional test that may be used for cervical cancer screening. The HPV test looks for the virus that can cause the cell changes on the cervix. The cells collected during the Pap test can be tested for HPV. The HPV test could be used to screen women aged 26 years and older,  and should be used in women of any age who have unclear Pap test results. After the age of 72, women should have HPV testing at the same frequency as a Pap test.  Colorectal cancer can be detected and often prevented. Most routine colorectal cancer screening begins at the age of 72 and continues through age 69. However, your caregiver may recommend screening at an earlier age if you have risk factors for colon cancer. On a yearly basis, your caregiver may provide home test kits to check for hidden blood in the stool. Use of a small camera at the end of a tube, to directly examine the colon (sigmoidoscopy or colonoscopy), can detect the earliest forms of colorectal cancer. Talk to your caregiver about this at age 53, when routine screening begins. Direct examination of the colon should be repeated every 5 to 10 years through age 4, unless early forms of pre-cancerous polyps or small growths are found.  Hepatitis C blood testing is recommended for  all people born from [birth year] through 1963 and any individual with known risks for hepatitis C.  Practice safe sex. Use condoms and avoid high-risk sexual practices to reduce the spread of sexually transmitted infections (STIs). Sexually active women aged 37 and younger should be checked for Chlamydia, which is a common sexually transmitted infection. Older women with new or multiple partners should also be tested for Chlamydia. Testing for other STIs is recommended if you are sexually active and at increased risk.  Osteoporosis is a disease in which the bones lose minerals and strength with aging. This can result in serious bone fractures. The risk of osteoporosis can be identified using a bone density scan. Women ages 90 and over and women at risk for fractures or osteoporosis should discuss screening with their caregivers. Ask your caregiver whether you should be taking a calcium supplement or vitamin D to reduce the rate of osteoporosis.  Menopause can be associated with physical symptoms and risks. Hormone replacement therapy is available to decrease symptoms and risks. You should talk to your caregiver about whether hormone replacement therapy is right for you.  Use sunscreen. Apply sunscreen liberally and repeatedly throughout the day. You should seek shade when your shadow is shorter than you. Protect yourself by wearing long sleeves, pants, a wide-brimmed hat, and sunglasses year round, whenever you are outdoors.  Notify your caregiver of new moles or changes in moles, especially if there is a change in shape or color. Also notify your caregiver if a mole is larger than the size of a pencil eraser.  Stay current with your immunizations. Document Released: 10/27/2010 Document Revised: 08/08/2012 Document Reviewed: 10/27/2010 [facility] Patient Information 2014 [place].

## 2014-02-28 ENCOUNTER — Encounter: Payer: No Typology Code available for payment source | Admitting: Gynecology

## 2014-03-05 NOTE — Telephone Encounter (Signed)
Appointment 03/13/14 @ 9:00am

## 2014-03-13 ENCOUNTER — Ambulatory Visit
Admission: RE | Admit: 2014-03-13 | Discharge: 2014-03-13 | Disposition: A | Discharge status: Home / Self Care | Payer: Medicare Other | Source: Ambulatory Visit | Attending: Gynecology | Admitting: Gynecology

## 2014-03-13 DIAGNOSIS — N63 Unspecified lump in unspecified breast: Secondary | ICD-10-CM

## 2014-03-20 ENCOUNTER — Encounter: Payer: Self-pay | Admitting: Gynecology

## 2014-04-10 ENCOUNTER — Encounter: Payer: Self-pay | Admitting: Cardiovascular Disease

## 2014-04-10 ENCOUNTER — Ambulatory Visit (INDEPENDENT_AMBULATORY_CARE_PROVIDER_SITE_OTHER): Payer: Medicare Other | Admitting: Cardiovascular Disease

## 2014-04-10 VITALS — BP 130/80 | HR 63 | Ht 64.0 in | Wt 108.3 lb

## 2014-04-10 DIAGNOSIS — I10 Essential (primary) hypertension: Secondary | ICD-10-CM

## 2014-04-10 DIAGNOSIS — I6529 Occlusion and stenosis of unspecified carotid artery: Secondary | ICD-10-CM

## 2014-04-10 DIAGNOSIS — R002 Palpitations: Secondary | ICD-10-CM

## 2014-04-10 NOTE — Patient Instructions (Signed)
Please follow up with Dr. Gwenlyn Found as needed.

## 2014-04-10 NOTE — Progress Notes (Signed)
04/10/2014 AYODELE HARTSOCK   1930-08-04  [id number]  Primary Physician [name], MD Primary Cardiologist: Lorretta Harp MD [name]   HPI:   [name] is a pleasant 80 year old thin appearing widowed [place] American female mother of one daughter who is accompanying her today. I last saw her 6 months ago. She was referred by Dr. Deforest Hoyles from [place] for cardiovascular evaluation because of palpitations. Factors include treated hypertension. She's never had a heart attack but has had apparently a TIA in the past and has documented carotid disease on aspirin Plavix. She denies chest pain or shortness of breath. She had noticed palpitations that she complained of for several months or so and did see someone in the emergency room who ordered a Holter monitor. This revealed PACs, PVCs and occasional runs of atrial tachycardia. She is on low-dose beta blocker. What her beta blocker dose was increased she became more symptomatic. She saw Cecilie Kicks registered nurse practitioner in the office 01/04/14 and has been doing well since with less noticeable palpitations.   Current Outpatient Prescriptions  Medication Sig Dispense Refill  . aspirin (ECOTRIN LOW STRENGTH) 81 MG EC tablet Take 81 mg by mouth daily. Swallow whole.    . cholecalciferol (VITAMIN D) 1000 UNITS tablet Take 1,000 Units by mouth daily.     . clopidogrel (PLAVIX) 75 MG tablet Take 75 mg by mouth daily.    . fluticasone (FLONASE) 50 MCG/ACT nasal spray Place 2 sprays into both nostrils daily. 56.3 g 0  . folic acid (FOLVITE) 1 MG tablet Take 1 mg by mouth daily.    Marland Kitchen ibandronate (BONIVA) 150 MG tablet Take 1 tablet by mouth every 30 (thirty) days.  1  . ketoconazole (NIZORAL) 2 % shampoo Apply 1 application topically daily as needed for irritation.     Marland Kitchen loratadine (CLARITIN) 10 MG tablet Take 10 mg by mouth daily as needed for allergies.    . metoprolol tartrate (LOPRESSOR) 25 MG tablet Take 25 mg by mouth  2 (two) times daily.     No current facility-administered medications for this visit.    Allergies  Allergen Reactions  . Demerol [Meperidine] Other (See Comments)    Excessive sweating, cramping  . Sulfa Antibiotics Other (See Comments)    Reaction unknown  . Codeine Rash    History   Social History  . Marital Status: Widowed    Spouse Name: N/A    Number of Children: N/A  . Years of Education: N/A   Occupational History  . Not on file.   Social History Main Topics  . Smoking status: Never Smoker   . Smokeless tobacco: Never Used  . Alcohol Use: No  . Drug Use: No  . Sexual Activity: No   Other Topics Concern  . Not on file   Social History Narrative     Review of Systems: General: negative for chills, fever, night sweats or weight changes.  Cardiovascular: negative for chest pain, dyspnea on exertion, edema, orthopnea, palpitations, paroxysmal nocturnal dyspnea or shortness of breath Dermatological: negative for rash Respiratory: negative for cough or wheezing Urologic: negative for hematuria Abdominal: negative for nausea, vomiting, diarrhea, bright red blood per rectum, melena, or hematemesis Neurologic: negative for visual changes, syncope, or dizziness All other systems reviewed and are otherwise negative except as noted above.    Blood pressure 130/80, pulse 63, height 5\' 4"  (1.626 m), weight 108 lb 4.8 oz (49.125 kg).  General appearance: alert and no distress Neck:  no adenopathy, no carotid bruit, no JVD, supple, symmetrical, trachea midline and thyroid not enlarged, symmetric, no tenderness/mass/nodules Lungs: clear to auscultation bilaterally Heart: regular rate and rhythm, S1, S2 normal, no murmur, click, rub or gallop Extremities: extremities normal, atraumatic, no cyanosis or edema  EKG normal sinus rhythm at 63  without ST or T-wave changes. I personally reviewed this EKG  ASSESSMENT AND PLAN:   Palpitations Mrs. Trefz's  palpitations  have been less noticeable since I last saw her 6 months ago. She is on low-dose beta blocker (Lopressor 25 mg). When she was on a higher dose beta blocker she was more symptomatic. I will see her back as needed.      Lorretta Harp MD FACP,FACC,FAHA, [facility] 04/10/2014 10:11 AM

## 2014-04-10 NOTE — Assessment & Plan Note (Signed)
Sydney [name]  palpitations have been less noticeable since I last saw her 6 months ago. She is on low-dose beta blocker (Lopressor 25 mg). When she was on a higher dose beta blocker she was more symptomatic. I will see her back as needed.

## 2014-09-12 ENCOUNTER — Other Ambulatory Visit: Payer: Self-pay

## 2014-09-12 DIAGNOSIS — Z1231 Encounter for screening mammogram for malignant neoplasm of breast: Secondary | ICD-10-CM

## 2014-10-25 ENCOUNTER — Ambulatory Visit
Admission: RE | Admit: 2014-10-25 | Discharge: 2014-10-25 | Disposition: A | Discharge status: Home / Self Care | Payer: Medicare Other | Source: Ambulatory Visit

## 2014-10-25 DIAGNOSIS — Z1231 Encounter for screening mammogram for malignant neoplasm of breast: Secondary | ICD-10-CM

## 2015-03-28 ENCOUNTER — Ambulatory Visit (INDEPENDENT_AMBULATORY_CARE_PROVIDER_SITE_OTHER): Payer: Medicare Other | Admitting: Gynecology

## 2015-03-28 ENCOUNTER — Encounter: Payer: Self-pay | Admitting: Gynecology

## 2015-03-28 VITALS — BP 134/80 | Ht 64.0 in | Wt 108.0 lb

## 2015-03-28 DIAGNOSIS — M810 Age-related osteoporosis without current pathological fracture: Secondary | ICD-10-CM

## 2015-03-28 DIAGNOSIS — Z01419 Encounter for gynecological examination (general) (routine) without abnormal findings: Secondary | ICD-10-CM

## 2015-03-28 DIAGNOSIS — N952 Postmenopausal atrophic vaginitis: Secondary | ICD-10-CM | POA: Diagnosis not present

## 2015-03-28 NOTE — Progress Notes (Signed)
LADAJAH ZAHM 04-24-1931 [id number]        81 y.o.  G1P1001  Breast and pelvic exam.  Past medical history,surgical history, problem list, medications, allergies, family history and social history were all reviewed and documented as reviewed in the EPIC chart.  ROS:  Performed with pertinent positives and negatives included in the history, assessment and plan.   Additional significant findings :  none   Exam: Leanne Lovely Vitals:   03/28/15 0929  BP: 134/80  Height: 5\' 4"  (1.626 m)  Weight: 108 lb (48.988 kg)   General appearance:  Normal affect, orientation and appearance. Skin: Grossly normal HEENT: Without gross lesions.  No cervical or supraclavicular adenopathy. Thyroid normal.  Lungs:  Clear without wheezing, rales or rhonchi Cardiac: RR, without RMG Abdominal:  Soft, nontender, without masses, guarding, rebound, organomegaly or hernia Breasts:  Examined lying and sitting without masses, retractions, discharge or axillary adenopathy. Pelvic:  Ext/BUS/vagina with atrophic changes  Cervix with atrophic changes  Uterus axial to anteverted, normal size, shape and contour, midline and mobile nontender   Adnexa  Without masses or tenderness    Anus and perineum  Normal   Rectovaginal  Normal sphincter tone without palpated masses or tenderness.    Assessment/Plan:  81 y.o. G47P1001 female for breast and pelvic exam.   1. Postmenopausal/atrophic genital changes. Patient without significant hot flushes, night sweats, vaginal dryness or any vaginal bleeding. Continue to monitor and report any vaginal bleeding. 2. Osteoporosis. DEXA 2015 T score -2.5. Being followed by Dr. Deforest Hoyles on [place]. 3. Mammography 09/2014. Continue with annual mammography when do.  SBE monthly reviewed.  Had small right axillary nodule last year felt to be a sebaceous cyst. It has disappeared and is no longer palpated this year. 4. Colonoscopy 7 years ago historically. Repeat at their  recommended interval. 5. Pap smear 2013. No Pap smear done today. No history of abnormal Pap smears.  Per current screening guidelines we both agree to stop screening based on age. 6. Health maintenance. No routine blood work done as patient has this done at her primary physician's office. Follow up in one year, sooner as needed.     [name] MD, 9:46 AM 03/28/2015

## 2015-03-28 NOTE — Patient Instructions (Signed)

## 2015-03-29 LAB — URINALYSIS W MICROSCOPIC + REFLEX CULTURE
BILIRUBIN URINE: NEGATIVE
Bacteria, UA: NONE SEEN [HPF]
CASTS: NONE SEEN [LPF]
Crystals: NONE SEEN [HPF]
Glucose, UA: NEGATIVE
HGB URINE DIPSTICK: NEGATIVE
KETONES UR: NEGATIVE
Leukocytes, UA: NEGATIVE
Nitrite: NEGATIVE
Protein, ur: NEGATIVE
RBC / HPF: NONE SEEN RBC/HPF (ref <=2)
SQUAMOUS EPITHELIAL / LPF: NONE SEEN [HPF] (ref <=5)
Specific Gravity, Urine: 1.007 (ref 1.001–1.035)
WBC UA: NONE SEEN WBC/HPF (ref <=5)
Yeast: NONE SEEN [HPF]
pH: 6.5 (ref 5.0–8.0)

## 2015-04-03 ENCOUNTER — Emergency Department (HOSPITAL_COMMUNITY)
Admission: EM | Admit: 2015-04-03 | Discharge: 2015-04-04 | Disposition: A | Discharge status: Home / Self Care | Payer: Medicare Other | Attending: Emergency Medicine | Admitting: Emergency Medicine

## 2015-04-03 ENCOUNTER — Encounter (HOSPITAL_COMMUNITY): Payer: Self-pay | Admitting: Emergency Medicine

## 2015-04-03 ENCOUNTER — Emergency Department (HOSPITAL_COMMUNITY): Payer: Medicare Other

## 2015-04-03 DIAGNOSIS — Z7902 Long term (current) use of antithrombotics/antiplatelets: Secondary | ICD-10-CM | POA: Insufficient documentation

## 2015-04-03 DIAGNOSIS — Z79899 Other long term (current) drug therapy: Secondary | ICD-10-CM | POA: Insufficient documentation

## 2015-04-03 DIAGNOSIS — M1990 Unspecified osteoarthritis, unspecified site: Secondary | ICD-10-CM | POA: Diagnosis not present

## 2015-04-03 DIAGNOSIS — R519 Headache, unspecified: Secondary | ICD-10-CM

## 2015-04-03 DIAGNOSIS — Z8673 Personal history of transient ischemic attack (TIA), and cerebral infarction without residual deficits: Secondary | ICD-10-CM | POA: Diagnosis not present

## 2015-04-03 DIAGNOSIS — R51 Headache: Secondary | ICD-10-CM | POA: Insufficient documentation

## 2015-04-03 DIAGNOSIS — I10 Essential (primary) hypertension: Secondary | ICD-10-CM | POA: Diagnosis not present

## 2015-04-03 DIAGNOSIS — Z8639 Personal history of other endocrine, nutritional and metabolic disease: Secondary | ICD-10-CM | POA: Insufficient documentation

## 2015-04-03 DIAGNOSIS — Z7982 Long term (current) use of aspirin: Secondary | ICD-10-CM | POA: Diagnosis not present

## 2015-04-03 LAB — CBC
HEMATOCRIT: 33.9 % — AB (ref 36.0–46.0)
Hemoglobin: 11 g/dL — ABNORMAL LOW (ref 12.0–15.0)
MCH: 30.1 pg (ref 26.0–34.0)
MCHC: 32.4 g/dL (ref 30.0–36.0)
MCV: 92.6 fL (ref 78.0–100.0)
PLATELETS: 217 10*3/uL (ref 150–400)
RBC: 3.66 MIL/uL — ABNORMAL LOW (ref 3.87–5.11)
RDW: 13 % (ref 11.5–15.5)
WBC: 3.3 10*3/uL — ABNORMAL LOW (ref 4.0–10.5)

## 2015-04-03 LAB — BASIC METABOLIC PANEL
Anion gap: 5 (ref 5–15)
BUN: 13 mg/dL (ref 6–20)
CHLORIDE: 108 mmol/L (ref 101–111)
CO2: 25 mmol/L (ref 22–32)
CREATININE: 0.69 mg/dL (ref 0.44–1.00)
Calcium: 9.1 mg/dL (ref 8.9–10.3)
GFR calc Af Amer: >60 mL/min (ref >60)
GFR calc non Af Amer: >60 mL/min (ref >60)
GLUCOSE: 111 mg/dL — AB (ref 65–99)
POTASSIUM: 3.8 mmol/L (ref 3.5–5.1)
SODIUM: 138 mmol/L (ref 135–145)

## 2015-04-03 NOTE — Discharge Instructions (Signed)
Tylenol 1000 mg every 6 hours as needed for pain.  Return to the emergency department if symptoms significantly worsen or change.   General Headache Without Cause A headache is pain or discomfort felt around the head or neck area. The specific cause of a headache may not be found. There are many causes and types of headaches. A few common ones are:  Tension headaches.  Migraine headaches.  Cluster headaches.  Chronic daily headaches. HOME CARE INSTRUCTIONS  Watch your condition for any changes. Take these steps to help with your condition: Managing Pain  Take over-the-counter and prescription medicines only as told by your health care provider.  Lie down in a dark, quiet room when you have a headache.  If directed, apply ice to the head and neck area:  Put ice in a plastic bag.  Place a towel between your skin and the bag.  Leave the ice on for 20 minutes, 2-3 times per day.  Use a heating pad or hot shower to apply heat to the head and neck area as told by your health care provider.  Keep lights dim if bright lights bother you or make your headaches worse. Eating and Drinking  Eat meals on a regular schedule.  Limit alcohol use.  Decrease the amount of caffeine you drink, or stop drinking caffeine. General Instructions  Keep all follow-up visits as told by your health care provider. This is important.  Keep a headache journal to help find out what may trigger your headaches. For example, write down:  What you eat and drink.  How much sleep you get.  Any change to your diet or medicines.  Try massage or other relaxation techniques.  Limit stress.  Sit up straight, and do not tense your muscles.  Do not use tobacco products, including cigarettes, chewing tobacco, or e-cigarettes. If you need help quitting, ask your health care provider.  Exercise regularly as told by your health care provider.  Sleep on a regular schedule. Get 7-9 hours of sleep, or the  amount recommended by your health care provider. SEEK MEDICAL CARE IF:   Your symptoms are not helped by medicine.  You have a headache that is different from the usual headache.  You have nausea or you vomit.  You have a fever. SEEK IMMEDIATE MEDICAL CARE IF:   Your headache becomes severe.  You have repeated vomiting.  You have a stiff neck.  You have a loss of vision.  You have problems with speech.  You have pain in the eye or ear.  You have muscular weakness or loss of muscle control.  You lose your balance or have trouble walking.  You feel faint or pass out.  You have confusion.   This information is not intended to replace advice given to you by your health care provider. Make sure you discuss any questions you have with your health care provider.   Document Released: 04/13/2005 Document Revised: 01/02/2015 Document Reviewed: 08/06/2014 Elsevier Interactive Patient Education [employer].

## 2015-04-03 NOTE — ED Notes (Addendum)
Pt states around 1530 she got a sudden onset of a headache above her right eye. Pt denies and change in vision, dizziness or vomiting. Pt is alert and ox4. No neuro deficits noted at triage.

## 2015-04-03 NOTE — ED Provider Notes (Signed)
CSN: [id number]     Arrival date & time 04/03/15  1752 History  By signing my name below, I, Eustaquio Maize, attest that this documentation has been prepared under the direction and in the presence of Veryl Speak, MD. Electronically Signed: Eustaquio Maize, ED Scribe. 04/03/2015. 11:34 PM.     Chief Complaint  Patient presents with  . Headache   The history is provided by the patient and a relative. No language interpreter was used.     HPI Comments: Sydney [name] is a 81 y.o. female with hx HTN who presents to the Emergency Department complaining of sudden onset, constant, right frontal headache that began around 3 PM today (approximately 8.5 hours ago). Pt states that the headache came on after eating [place] dressing. She called her PCP after 2 hours of constant headache and was told to come to the ED for further evaluation. Pt does not have hx of headaches and denies drinking excessive caffeine. SHe reports that the headache is still present but is gradually improving on its own. Denies blurry vision, nausea, vomiting, fever, neck pain, numbness, weakness, tingling, or any other associated symptoms. Daughter mentions similar symptoms a couple of years ago when pt was told there is "some kind of fluid in her right ear."    Past Medical History  Diagnosis Date  . Hypertension   . Osteoporosis 02/2014    T score -2.5  followed by Dr. Lysle Rubens  . TIA (transient ischemic attack) 1990's  . High cholesterol     "on RX years ago" (02/17/2013)  . Arthritis     "left hand" (02/17/2013)  . Palpitations     PACs, PVCs and short runs of atrial tachycardia on Holter monitoring   Past Surgical History  Procedure Laterality Date  . Tonsillectomy    . Anal fistulectomy  1970   Family History  Problem Relation Age of Onset  . Hypertension Father   . Heart attack Father   . Stroke Brother   . Stroke Maternal Grandmother    Social History  Substance Use Topics  . Smoking status: Never Smoker    . Smokeless tobacco: Never Used  . Alcohol Use: No   OB History    Gravida Para Term Preterm AB TAB SAB Ectopic Multiple Living   1 1 1       1      Review of Systems  A complete 10 system review of systems was obtained and all systems are negative except as noted in the HPI and PMH.   Allergies  Demerol; Sulfa antibiotics; Tomato; and Codeine  Home Medications   Prior to Admission medications   Medication Sig Start Date End Date Taking? Authorizing Provider  aspirin (ECOTRIN LOW STRENGTH) 81 MG EC tablet Take 81 mg by mouth daily. Swallow whole.   Yes Historical Provider, MD  cholecalciferol (VITAMIN D) 1000 UNITS tablet Take 1,000 Units by mouth daily.    Yes Historical Provider, MD  clopidogrel (PLAVIX) 75 MG tablet Take 75 mg by mouth daily.   Yes Historical Provider, MD  folic acid (FOLVITE) 1 MG tablet Take 1 mg by mouth daily.   Yes Historical Provider, MD  ketoconazole (NIZORAL) 2 % shampoo Apply 1 application topically daily as needed for irritation.  12/19/13  Yes Historical Provider, MD  loratadine (CLARITIN) 10 MG tablet Take 10 mg by mouth daily as needed for allergies (for seasonal allergies).   Yes Historical Provider, MD  metoprolol (LOPRESSOR) 50 MG tablet Take 25 mg by  mouth 2 (two) times daily. 1/2 TAB PO BID 03/14/15  Yes Historical Provider, MD  fluticasone (FLONASE) 50 MCG/ACT nasal spray Place 2 sprays into both nostrils daily. Patient not taking: Reported on 04/03/2015 01/11/14   Linton Flemings, MD  ibandronate (BONIVA) 150 MG tablet Take 1 tablet by mouth every 30 (thirty) days. 03/20/14   Historical Provider, MD   Triage Vitals: BP 152/72 mmHg  Pulse 57  Temp(Src) 98.1 F (36.7 C) (Oral)  Resp 13  Ht 5\' 4"  (1.626 m)  Wt 108 lb (48.988 kg)  BMI 18.53 kg/m2  SpO2 100%   Physical Exam  Constitutional: She is oriented to person, place, and time. She appears well-developed and well-nourished. No distress.  HENT:  Head: Normocephalic and atraumatic.  Eyes:  EOM are normal.  No papilledema  Neck: Normal range of motion.  Cardiovascular: Normal rate, regular rhythm and normal heart sounds.   Pulmonary/Chest: Effort normal and breath sounds normal.  Abdominal: Soft. She exhibits no distension. There is no tenderness.  Musculoskeletal: Normal range of motion.  Neurological: She is alert and oriented to person, place, and time. No cranial nerve deficit. She exhibits normal muscle tone. Coordination normal.  Skin: Skin is warm and dry.  Psychiatric: She has a normal mood and affect. Judgment normal.  Nursing note and vitals reviewed.   ED Course  Procedures (including critical care time)  DIAGNOSTIC STUDIES: Oxygen Saturation is 100% on RA, normal by my interpretation.    COORDINATION OF CARE: 11:30 PM-Discussed treatment plan which includes OTC Tylenol with pt at bedside and pt agreed to plan.   Labs Review Labs Reviewed  BASIC METABOLIC PANEL - Abnormal; Notable for the following:    Glucose, Bld 111 (*)    All other components within normal limits  CBC - Abnormal; Notable for the following:    WBC 3.3 (*)    RBC 3.66 (*)    Hemoglobin 11.0 (*)    HCT 33.9 (*)    All other components within normal limits    Imaging Review Ct Head Wo Contrast  04/03/2015  CLINICAL DATA:  81 year old with sudden onset of headache above right eye. EXAM: CT HEAD WITHOUT CONTRAST TECHNIQUE: Contiguous axial images were obtained from the base of the skull through the vertex without intravenous contrast. COMPARISON:  01/11/2014 FINDINGS: Prominence of the sulci and ventricles are identified consistent with brain atrophy. There is mild diffuse low attenuation within the subcortical and periventricular white matter compatible with chronic microvascular disease. There is a smoothly marginated left anterior parafalcine pleural-based mass which measures 1.7 by 0.8 cm, image 26 of series 2. This has increased in size from previous exam when it measured 0.9 x 0.6 cm.  This is favored to represent a benign meningioma. No evidence for acute brain infarct, acute intracranial hemorrhage or mass. The paranasal sinuses and the mastoid air cells appear clear. The calvarium is intact. IMPRESSION: 1. No acute intracranial abnormalities. 2. Left anterior parafalcine pleural based lesion is identified which is favored to represent a benign meningioma. Electronically Signed   By: Kerby Moors M.D.   On: 04/03/2015 21:24   I have personally reviewed and evaluated these images and lab results as part of my medical decision-making.    MDM   Final diagnoses:  None    Patient is an 81 year old female who presents with complaints of headache. She reports pain in her right forehead just above her right eye that started abruptly. Her neurologic exam is nonfocal and CT scan  of her head is negative. It does show a small lesion which is most likely a benign meningioma. I believe she is appropriate for discharge, to follow-up with her primary Dr. if symptoms persist. She is feeling somewhat better.   I personally performed the services described in this documentation, which was scribed in my presence. The recorded information has been reviewed and is accurate.       Veryl Speak, MD 04/04/15 [phone]

## 2015-04-03 NOTE — ED Notes (Signed)
CT called to get update on when CT could be done.

## 2015-04-08 ENCOUNTER — Inpatient Hospital Stay (HOSPITAL_COMMUNITY)
Admission: EM | Admit: 2015-04-08 | Discharge: 2015-04-09 | DRG: 123 | Disposition: A | Discharge status: Home / Self Care | Payer: Medicare Other | Attending: Internal Medicine | Admitting: Internal Medicine

## 2015-04-08 ENCOUNTER — Encounter (HOSPITAL_COMMUNITY): Payer: Self-pay | Admitting: Family Medicine

## 2015-04-08 ENCOUNTER — Inpatient Hospital Stay (HOSPITAL_COMMUNITY): Payer: Medicare Other

## 2015-04-08 ENCOUNTER — Emergency Department (HOSPITAL_COMMUNITY): Payer: Medicare Other

## 2015-04-08 DIAGNOSIS — I10 Essential (primary) hypertension: Secondary | ICD-10-CM | POA: Diagnosis present

## 2015-04-08 DIAGNOSIS — H4911 Fourth [trochlear] nerve palsy, right eye: Secondary | ICD-10-CM | POA: Diagnosis not present

## 2015-04-08 DIAGNOSIS — Z91018 Allergy to other foods: Secondary | ICD-10-CM

## 2015-04-08 DIAGNOSIS — Z8673 Personal history of transient ischemic attack (TIA), and cerebral infarction without residual deficits: Secondary | ICD-10-CM | POA: Diagnosis not present

## 2015-04-08 DIAGNOSIS — D72819 Decreased white blood cell count, unspecified: Secondary | ICD-10-CM | POA: Diagnosis present

## 2015-04-08 DIAGNOSIS — H4921 Sixth [abducent] nerve palsy, right eye: Secondary | ICD-10-CM | POA: Diagnosis present

## 2015-04-08 DIAGNOSIS — Z885 Allergy status to narcotic agent status: Secondary | ICD-10-CM

## 2015-04-08 DIAGNOSIS — Z7902 Long term (current) use of antithrombotics/antiplatelets: Secondary | ICD-10-CM

## 2015-04-08 DIAGNOSIS — Z882 Allergy status to sulfonamides status: Secondary | ICD-10-CM | POA: Diagnosis not present

## 2015-04-08 DIAGNOSIS — I639 Cerebral infarction, unspecified: Secondary | ICD-10-CM

## 2015-04-08 DIAGNOSIS — Z7982 Long term (current) use of aspirin: Secondary | ICD-10-CM | POA: Diagnosis not present

## 2015-04-08 DIAGNOSIS — E785 Hyperlipidemia, unspecified: Secondary | ICD-10-CM | POA: Diagnosis present

## 2015-04-08 DIAGNOSIS — H532 Diplopia: Secondary | ICD-10-CM | POA: Diagnosis present

## 2015-04-08 DIAGNOSIS — Z79899 Other long term (current) drug therapy: Secondary | ICD-10-CM

## 2015-04-08 DIAGNOSIS — M810 Age-related osteoporosis without current pathological fracture: Secondary | ICD-10-CM | POA: Diagnosis present

## 2015-04-08 DIAGNOSIS — R001 Bradycardia, unspecified: Secondary | ICD-10-CM | POA: Diagnosis not present

## 2015-04-08 DIAGNOSIS — D61818 Other pancytopenia: Secondary | ICD-10-CM | POA: Diagnosis present

## 2015-04-08 DIAGNOSIS — H4910 Fourth [trochlear] nerve palsy, unspecified eye: Secondary | ICD-10-CM | POA: Diagnosis present

## 2015-04-08 DIAGNOSIS — I6789 Other cerebrovascular disease: Secondary | ICD-10-CM | POA: Diagnosis not present

## 2015-04-08 DIAGNOSIS — G459 Transient cerebral ischemic attack, unspecified: Secondary | ICD-10-CM

## 2015-04-08 LAB — DIFFERENTIAL
BASOS ABS: 0 10*3/uL (ref 0.0–0.1)
BASOS PCT: 0 %
EOS ABS: 0 10*3/uL (ref 0.0–0.7)
EOS PCT: 0 %
LYMPHS ABS: 0.7 10*3/uL (ref 0.7–4.0)
LYMPHS PCT: 23 %
MONO ABS: 0.5 10*3/uL (ref 0.1–1.0)
MONOS PCT: 15 %
NEUTROS ABS: 1.8 10*3/uL (ref 1.7–7.7)
NEUTROS PCT: 62 %

## 2015-04-08 LAB — I-STAT CHEM 8, ED
BUN: 9 mg/dL (ref 6–20)
Calcium, Ion: 1.18 mmol/L (ref 1.13–1.30)
Chloride: 100 mmol/L — ABNORMAL LOW (ref 101–111)
Creatinine, Ser: 0.6 mg/dL (ref 0.44–1.00)
GLUCOSE: 89 mg/dL (ref 65–99)
HCT: 41 % (ref 36.0–46.0)
HEMOGLOBIN: 13.9 g/dL (ref 12.0–15.0)
POTASSIUM: 3.7 mmol/L (ref 3.5–5.1)
SODIUM: 140 mmol/L (ref 135–145)
TCO2: 27 mmol/L (ref 0–100)

## 2015-04-08 LAB — CBC
HCT: 37.1 % (ref 36.0–46.0)
HEMOGLOBIN: 11.9 g/dL — AB (ref 12.0–15.0)
MCH: 29.8 pg (ref 26.0–34.0)
MCHC: 32.1 g/dL (ref 30.0–36.0)
MCV: 93 fL (ref 78.0–100.0)
PLATELETS: 246 10*3/uL (ref 150–400)
RBC: 3.99 MIL/uL (ref 3.87–5.11)
RDW: 12.8 % (ref 11.5–15.5)
WBC: 3 10*3/uL — ABNORMAL LOW (ref 4.0–10.5)

## 2015-04-08 LAB — COMPREHENSIVE METABOLIC PANEL
ALT: 25 U/L (ref 14–54)
ANION GAP: 6 (ref 5–15)
AST: 31 U/L (ref 15–41)
Albumin: 3.3 g/dL — ABNORMAL LOW (ref 3.5–5.0)
Alkaline Phosphatase: 78 U/L (ref 38–126)
BUN: 7 mg/dL (ref 6–20)
CHLORIDE: 104 mmol/L (ref 101–111)
CO2: 27 mmol/L (ref 22–32)
CREATININE: 0.61 mg/dL (ref 0.44–1.00)
Calcium: 8.8 mg/dL — ABNORMAL LOW (ref 8.9–10.3)
Glucose, Bld: 94 mg/dL (ref 65–99)
POTASSIUM: 3.7 mmol/L (ref 3.5–5.1)
Sodium: 137 mmol/L (ref 135–145)
Total Bilirubin: 0.5 mg/dL (ref 0.3–1.2)
Total Protein: 6.8 g/dL (ref 6.5–8.1)

## 2015-04-08 LAB — URINALYSIS, ROUTINE W REFLEX MICROSCOPIC
BILIRUBIN URINE: NEGATIVE
GLUCOSE, UA: NEGATIVE mg/dL
KETONES UR: NEGATIVE mg/dL
Nitrite: NEGATIVE
PH: 6.5 (ref 5.0–8.0)
PROTEIN: NEGATIVE mg/dL
Specific Gravity, Urine: 1.008 (ref 1.005–1.030)

## 2015-04-08 LAB — URINE MICROSCOPIC-ADD ON

## 2015-04-08 LAB — I-STAT TROPONIN, ED: TROPONIN I, POC: 0 ng/mL (ref 0.00–0.08)

## 2015-04-08 LAB — APTT: APTT: 31 s (ref 24–37)

## 2015-04-08 LAB — PROTIME-INR
INR: 1.14 (ref 0.00–1.49)
Prothrombin Time: 14.8 seconds (ref 11.6–15.2)

## 2015-04-08 MED ORDER — LORATADINE 10 MG PO TABS: 10.0000 mg | ORAL_TABLET | Freq: Every day | ORAL | Status: DC | PRN

## 2015-04-08 MED ORDER — ASPIRIN 81 MG PO TBEC: 81.0000 mg | DELAYED_RELEASE_TABLET | Freq: Every day | ORAL | Status: DC

## 2015-04-08 MED ORDER — CLOPIDOGREL BISULFATE 75 MG PO TABS
75.0000 mg | ORAL_TABLET | Freq: Every day | ORAL | Status: DC
  Administered 2015-04-09: 75 mg via ORAL
  Filled 2015-04-08: qty 1

## 2015-04-08 MED ORDER — METOPROLOL TARTRATE 25 MG PO TABS
25.0000 mg | ORAL_TABLET | Freq: Two times a day (BID) | ORAL | Status: DC
  Administered 2015-04-09: 25 mg via ORAL
  Filled 2015-04-08 (×2): qty 1

## 2015-04-08 MED ORDER — ENOXAPARIN SODIUM 30 MG/0.3ML ~~LOC~~ SOLN
30.0000 mg | SUBCUTANEOUS | Status: DC
  Administered 2015-04-09: 30 mg via SUBCUTANEOUS
  Filled 2015-04-08 (×2): qty 0.3

## 2015-04-08 MED ORDER — ASPIRIN EC 81 MG PO TBEC
81.0000 mg | DELAYED_RELEASE_TABLET | Freq: Every day | ORAL | Status: DC
  Administered 2015-04-08 – 2015-04-09 (×2): 81 mg via ORAL
  Filled 2015-04-08 (×2): qty 1

## 2015-04-08 MED ORDER — STROKE: EARLY STAGES OF RECOVERY BOOK
Freq: Once | Status: AC
  Administered 2015-04-08: 1

## 2015-04-08 MED ORDER — TRAMADOL HCL 50 MG PO TABS: 50.0000 mg | ORAL_TABLET | Freq: Four times a day (QID) | ORAL | Status: DC | PRN

## 2015-04-08 MED ORDER — FOLIC ACID 1 MG PO TABS
1.0000 mg | ORAL_TABLET | Freq: Every day | ORAL | Status: DC
  Administered 2015-04-09: 1 mg via ORAL
  Filled 2015-04-08: qty 1

## 2015-04-08 MED ORDER — KETOCONAZOLE 2 % EX SHAM
1.0000 "application " | MEDICATED_SHAMPOO | Freq: Every day | CUTANEOUS | Status: DC | PRN
  Filled 2015-04-08: qty 120

## 2015-04-08 MED ORDER — GADOBENATE DIMEGLUMINE 529 MG/ML IV SOLN
10.0000 mL | Freq: Once | INTRAVENOUS | Status: AC | PRN
  Administered 2015-04-08: 10 mL via INTRAVENOUS

## 2015-04-08 NOTE — ED Notes (Signed)
Patient is in MRI.  

## 2015-04-08 NOTE — ED Notes (Addendum)
Attempted report 

## 2015-04-08 NOTE — ED Notes (Signed)
Attempted report 

## 2015-04-08 NOTE — H&P (Signed)
[facility] History and Physical  Sydney [name] [id number] DOB: [birth year] DOA: 04/08/2015  Referring physician: ED PA, Margarita Mail  PCP: [name], MD   Chief Complaint: diplopia   HPI:  Patient is very pleasant 81 year old female with known hypertension, hyperlipidemia, osteoporosis presented to [facility] emergency department for evaluation of sudden onset of double vision that started one day prior to this admission. Patient went to see ophthalmologist and was referred for an admission to [facility] for further evaluation. Review of records indicated patient has presented to the emergency department on December 7 with headaches and CT of the brain at that time was unremarkable and patient was sent home. Patient explains her headaches improved since that time but one day prior to this admission she developed diplopia. She denies similar events in the past, no blindness, no dizziness or lightheadedness. Patient also denies numbness and tingling in upper or lower extremities, no loss of sensation throughout the body. Patient also denies fevers and chills, no abdominal or urinary concerns, no chest pain or shortness of breath.  In emergency department patient noted to be hemodynamically stable, vital signs stable except mild bradycardia with heart rate 49-57, blood work notable for WBC 3, no other abnormalities. Neurology consulted and [facility] asked to admit for further evaluation to telemetry bed.  Assessment and Plan:  Principal Problem:   Cranial nerve IV paresis, right side - MRI brain without acute infarct. MRA brain unremarkable. - per neurology, suspect MRI negative pontine infarct, most likely due to small vessel disese. - admit to telemetry unit for stroke work up - check HgbA1c, fasting lipid panel - Echocardiogram, carotid dopplers - Frequent neuro checks - PT/OT SLP Active Problems:   Bradycardia - on metoprolol at home, continue for now but may need to hold if  HR < 50    Leukopenia - unclear etiology - repeat CBC in AM  Lovenox SQ for DVT prophylaxis    Radiological Exams on Admission: Mr Angiogram Head Wo Contrast 04/08/2015 Atrophy and small vessel disease.  No acute intracranial findings. Right-sided mastoid fluid without associated otitis. Correlate clinically as a source of headache. Incidental 8 x 16 x 14 mm LEFT posterior frontal parasagittal meningioma, without mass effect on the underlying brain. No intracranial vascular abnormality of significance.   Mr Jeri Cos Wo Contrast 04/08/2015  Atrophy and small vessel disease.  No acute intracranial findings. Right-sided mastoid fluid without associated otitis. Correlate clinically as a source of headache. Incidental 8 x 16 x 14 mm LEFT posterior frontal parasagittal meningioma, without mass effect on the underlying brain. No intracranial vascular abnormality of significance.   Code Status: Full Family Communication: Pt and daughter at bedside Disposition Plan: Admit for further evaluation, telemetry bed  Consultants: Neurology   [facility] [id number]   Review of Systems:  Constitutional: Negative for diaphoresis.  HENT: Negative for hearing loss, ear pain, nosebleeds, congestion, sore throat, neck pain, tinnitus and ear discharge.   Eyes: Negative for blurred vision, photophobia, pain, discharge and redness.  Respiratory: Negative for cough, hemoptysis, sputum production, shortness of breath, wheezing and stridor.   Cardiovascular: Negative for chest pain, palpitations, orthopnea, claudication and leg swelling.  Gastrointestinal: Negative for nausea, vomiting and abdominal pain. Negative for heartburn, constipation, blood in stool and melena.  Genitourinary: Negative for dysuria, urgency, frequency, hematuria and flank pain.  Musculoskeletal: Negative for myalgias, back pain, joint pain and falls.  Skin: Negative for itching and rash.  Neurological: Negative for dizziness and  weakness. Endo/Heme/Allergies:  Negative for environmental allergies and polydipsia. Does not bruise/bleed easily.  Psychiatric/Behavioral: Negative for suicidal ideas. The patient is not nervous/anxious.     Past Medical History  Diagnosis Date  . Hypertension   . Osteoporosis 02/2014    T score -2.5  followed by Dr. Lysle Rubens  . TIA (transient ischemic attack) 1990's  . High cholesterol     "on RX years ago" (02/17/2013)  . Arthritis     "left hand" (02/17/2013)  . Palpitations     PACs, PVCs and short runs of atrial tachycardia on Holter monitoring    Past Surgical History  Procedure Laterality Date  . Tonsillectomy    . Anal fistulectomy  1970    Social History:  reports that she has never smoked. She has never used smokeless tobacco. She reports that she does not drink alcohol or use illicit drugs.  Allergies  Allergen Reactions  . Demerol [Meperidine] Other (See Comments)    Excessive sweating, cramping  . Codeine Rash  . Sulfa Antibiotics Other (See Comments)    Reaction unknown  . Tomato Itching    Family History  Problem Relation Age of Onset  . Hypertension Father   . Heart attack Father   . Stroke Brother   . Stroke Maternal Grandmother     Medication Sig  aspirin (ECOTRIN LOW STRENGTH) 81 MG EC tablet Take 81 mg by mouth daily. Swallow whole.  cholecalciferol (VITAMIN D) 1000 UNITS tablet Take 1,000 Units by mouth daily.   clopidogrel (PLAVIX) 75 MG tablet Take 75 mg by mouth daily.  folic acid (FOLVITE) 1 MG tablet Take 1 mg by mouth daily.  ibandronate (BONIVA) 150 MG tablet Take 1 tablet by mouth every 30 (thirty) days.  ketoconazole (NIZORAL) 2 % shampoo Apply 1 application topically daily as needed for irritation.   loratadine (CLARITIN) 10 MG tablet Take 10 mg by mouth daily as needed for allergies (for seasonal allergies).  metoprolol (LOPRESSOR) 50 MG tablet Take 25 mg by mouth 2 (two) times daily. 1/2 TAB PO BID  traMADol (ULTRAM) 50 MG tablet  Take 50 mg by mouth daily as needed.  fluticasone (FLONASE) 50 MCG/ACT nasal spray Place 2 sprays into both nostrils daily. Patient not taking: Reported on 04/03/2015    Physical Exam: Filed Vitals:   04/08/15 1745 04/08/15 1800 04/08/15 1815 04/08/15 1845  BP: 152/77 146/69 148/75 170/71  Pulse: 49 55 57 55  Temp:      TempSrc:      Resp: 14 15 20 18   Height:      Weight:      SpO2: 100% 100% 100% 100%    Physical Exam  Constitutional: Appears well-developed and well-nourished. No distress.  HENT: Normocephalic. External right and left ear normal. Oropharynx is clear and moist.  Eyes: Conjunctivae and EOM are normal. PERRLA, no scleral icterus.  Neck: Normal ROM. Neck supple. No JVD. No tracheal deviation. No thyromegaly.  CVS: RRR, S1/S2 +, no murmurs, no gallops, no carotid bruit.  Pulmonary: Effort and breath sounds normal, no stridor, rhonchi, wheezes, rales.  Abdominal: Soft. BS +,  no distension, tenderness, rebound or guarding.  Musculoskeletal: Normal range of motion. No edema and no tenderness.  Lymphadenopathy: No lymphadenopathy noted, cervical, inguinal. Neuro: Alert. Normal reflexes, CN VI right side mild paresis  Skin: Skin is warm and dry. No rash noted. Not diaphoretic. No erythema. No pallor.  Psychiatric: Normal mood and affect. Behavior, judgment, thought content normal.   Labs on Admission:  Basic Metabolic  Panel:  Recent Labs Lab 04/03/15 1849 04/08/15 1250 04/08/15 1305  NA 138 137 140  K 3.8 3.7 3.7  CL 108 104 100*  CO2 25 27  --   GLUCOSE 111* 94 89  BUN 13 7 9   CREATININE 0.69 0.61 0.60  CALCIUM 9.1 8.8*  --    Liver Function Tests:  Recent Labs Lab 04/08/15 1250  AST 31  ALT 25  ALKPHOS 78  BILITOT 0.5  PROT 6.8  ALBUMIN 3.3*   No results for input(s): LIPASE, AMYLASE in the last 168 hours. No results for input(s): AMMONIA in the last 168 hours. CBC:  Recent Labs Lab 04/03/15 1849 04/08/15 1250 04/08/15 1305  WBC 3.3*  3.0*  --   NEUTROABS  --  1.8  --   HGB 11.0* 11.9* 13.9  HCT 33.9* 37.1 41.0  MCV 92.6 93.0  --   PLT 217 246  --    Cardiac Enzymes: No results for input(s): CKTOTAL, CKMB, CKMBINDEX, TROPONINI in the last 168 hours. BNP: Invalid input(s): POCBNP CBG: No results for input(s): GLUCAP in the last 168 hours.  EKG: Normal sinus rhythm, no ST/T wave changes   If 7PM-7AM, please contact night-coverage [email or web address] Password [facility] 04/08/2015, 6:54 PM

## 2015-04-08 NOTE — ED Notes (Signed)
Pt sent here by her doctor to r/o stroke. sts she has been battling a headache over the past week. sts now she is having double vision. sts she needs MRI/MRA. sts symptoms over the past week have been changing and getting worse.

## 2015-04-08 NOTE — ED Provider Notes (Signed)
CSN: [id number]     Arrival date & time 04/08/15  1123 History   First MD Initiated Contact with Patient 04/08/15 1506     Chief Complaint  Patient presents with  . Eye Problem  . Headache     (Consider location/radiation/quality/duration/timing/severity/associated sxs/prior Treatment) The history is provided by the patient, a relative and medical records. No language interpreter was used.    Sydney [name] is a(n) 81 y.o. female who presents to the ED with cc of diplopia. The patient was seen in the ED on 12/7 for a headache. She had a CT which was negative.  The patient developed a "funny feeling" in her right eye on Saturday. She was seen by ophthalmology in the [facility] system. Her work up was negative. [name], who attends her, states that Sunday, the patient began to c/o double vision in the R eye. She noticed that her mother was continually blinking. And holding the eye closed. They contacted the ophthalmologist who say them again but referred them to the ED for MRI/MRA of the brain. She deniescurrent headache, weakness, loss of vision. She has a PMH of previous TIA.   Past Medical History  Diagnosis Date  . Hypertension   . Osteoporosis 02/2014    T score -2.5  followed by Dr. Lysle Rubens  . TIA (transient ischemic attack) 1990's  . High cholesterol     "on RX years ago" (02/17/2013)  . Arthritis     "left hand" (02/17/2013)  . Palpitations     PACs, PVCs and short runs of atrial tachycardia on Holter monitoring   Past Surgical History  Procedure Laterality Date  . Tonsillectomy    . Anal fistulectomy  1970   Family History  Problem Relation Age of Onset  . Hypertension Father   . Heart attack Father   . Stroke Brother   . Stroke Maternal Grandmother    Social History  Substance Use Topics  . Smoking status: Never Smoker   . Smokeless tobacco: Never Used  . Alcohol Use: No   OB History    Gravida Para Term Preterm AB TAB SAB Ectopic Multiple Living   1  1 1       1      Review of Systems  Ten systems reviewed and are negative for acute change, except as noted in the HPI.    Allergies  Demerol; Codeine; Sulfa antibiotics; and Tomato  Home Medications   Prior to Admission medications   Medication Sig Start Date End Date Taking? Authorizing Provider  aspirin (ECOTRIN LOW STRENGTH) 81 MG EC tablet Take 81 mg by mouth daily. Swallow whole.   Yes Historical Provider, MD  cholecalciferol (VITAMIN D) 1000 UNITS tablet Take 1,000 Units by mouth daily.    Yes Historical Provider, MD  clopidogrel (PLAVIX) 75 MG tablet Take 75 mg by mouth daily.   Yes Historical Provider, MD  folic acid (FOLVITE) 1 MG tablet Take 1 mg by mouth daily.   Yes Historical Provider, MD  ibandronate (BONIVA) 150 MG tablet Take 1 tablet by mouth every 30 (thirty) days. 03/20/14  Yes Historical Provider, MD  ketoconazole (NIZORAL) 2 % shampoo Apply 1 application topically daily as needed for irritation.  12/19/13  Yes Historical Provider, MD  loratadine (CLARITIN) 10 MG tablet Take 10 mg by mouth daily as needed for allergies (for seasonal allergies).   Yes Historical Provider, MD  metoprolol (LOPRESSOR) 50 MG tablet Take 25 mg by mouth 2 (two) times daily. 1/2 TAB  PO BID 03/14/15  Yes Historical Provider, MD  traMADol (ULTRAM) 50 MG tablet Take 50 mg by mouth daily as needed. 04/04/15  Yes Historical Provider, MD  fluticasone (FLONASE) 50 MCG/ACT nasal spray Place 2 sprays into both nostrils daily. Patient not taking: Reported on 04/03/2015 01/11/14   Linton Flemings, MD   BP 128/64 mmHg  Pulse 74  Temp(Src) 98.5 F (36.9 C) (Oral)  Resp 18  Ht 5\' 4"  (1.626 m)  Wt 48.988 kg  BMI 18.53 kg/m2  SpO2 99% Physical Exam  Constitutional: She is oriented to person, place, and time. She appears well-developed and well-nourished. No distress.  HENT:  Head: Normocephalic and atraumatic.  Eyes: Conjunctivae are normal. Pupils are equal, round, and reactive to light. No scleral  icterus.  Patient is unable to abduct the R eye past midline.  Neck: Normal range of motion.  Cardiovascular: Normal rate, regular rhythm and normal heart sounds.  Exam reveals no gallop and no friction rub.   No murmur heard. Pulmonary/Chest: Effort normal and breath sounds normal. No respiratory distress.  Abdominal: Soft. Bowel sounds are normal. She exhibits no distension and no mass. There is no tenderness. There is no guarding.  Neurological: She is alert and oriented to person, place, and time.  Speech is clear and goal oriented, follows commands Major Cranial nerves without deficit (except as noted with EOMI), no facial droop Normal strength in upper and lower extremities bilaterally including dorsiflexion and plantar flexion, strong and equal grip strength Sensation normal to light and sharp touch Moves extremities without ataxia, coordination intact Normal finger to nose and rapid alternating movements Neg romberg, no pronator drift Normal gait Normal heel-shin   Skin: Skin is warm and dry. She is not diaphoretic.  Nursing note and vitals reviewed.   ED Course  Procedures (including critical care time) Labs Review Labs Reviewed  CBC - Abnormal; Notable for the following:    WBC 3.0 (*)    Hemoglobin 11.9 (*)    All other components within normal limits  COMPREHENSIVE METABOLIC PANEL - Abnormal; Notable for the following:    Calcium 8.8 (*)    Albumin 3.3 (*)    All other components within normal limits  LIPID PANEL - Abnormal; Notable for the following:    LDL Cholesterol 104 (*)    All other components within normal limits  URINALYSIS, ROUTINE W REFLEX MICROSCOPIC (NOT AT [facility]) - Abnormal; Notable for the following:    Hgb urine dipstick SMALL (*)    Leukocytes, UA TRACE (*)    All other components within normal limits  URINE MICROSCOPIC-ADD ON - Abnormal; Notable for the following:    Squamous Epithelial / LPF 0-5 (*)    Bacteria, UA RARE (*)    All other  components within normal limits  CBC - Abnormal; Notable for the following:    WBC 2.5 (*)    Hemoglobin 11.9 (*)    HCT 35.8 (*)    All other components within normal limits  BASIC METABOLIC PANEL - Abnormal; Notable for the following:    BUN 5 (*)    Calcium 8.7 (*)    All other components within normal limits  SEDIMENTATION RATE - Abnormal; Notable for the following:    Sed Rate 53 (*)    All other components within normal limits  I-STAT CHEM 8, ED - Abnormal; Notable for the following:    Chloride 100 (*)    All other components within normal limits  PROTIME-INR  APTT  DIFFERENTIAL  LIPID PANEL  HEMOGLOBIN A1C  HEMOGLOBIN A1C  I-STAT TROPOININ, ED  CBG MONITORING, ED    Imaging Review Dg Chest 2 View  04/08/2015  CLINICAL DATA:  Acute onset of headache and right eye weakness. Initial encounter. EXAM: CHEST  2 VIEW COMPARISON:  Chest radiograph performed 02/17/2013 FINDINGS: The lungs are hyperexpanded, with flattening of the hemidiaphragms, compatible with COPD. Small bilateral pleural effusions are noted, with mild bibasilar opacities likely reflecting atelectasis. There is no evidence of pneumothorax. The heart is normal in size; the mediastinal contour is within normal limits. No acute osseous abnormalities are seen. IMPRESSION: 1. Small bilateral pleural effusions, with mild bibasilar airspace opacities likely reflecting atelectasis. 2. Findings of COPD. Electronically Signed   By: Garald Balding M.D.   On: 04/08/2015 23:35   Mr Angiogram Head Wo Contrast  04/08/2015  CLINICAL DATA:  Headache for 5 days increasing in intensity. Negative workup 04/03/2015. Blurred vision began today EXAM: MRI HEAD WITHOUT AND WITH CONTRAST MRA HEAD WITHOUT CONTRAST TECHNIQUE: Multiplanar, multiecho pulse sequences of the brain and surrounding structures were obtained without and with intravenous contrast. Angiographic images of the head were obtained using MRA technique without contrast.  CONTRAST:  27mL MULTIHANCE GADOBENATE DIMEGLUMINE 529 MG/ML IV SOLN COMPARISON:  CT head 04/03/2015. FINDINGS: MRI HEAD FINDINGS No evidence for acute infarction, hemorrhage, hydrocephalus, or extra-axial fluid. Generalized atrophy. Chronic microvascular ischemic change of a mild-to-moderate degree. Pituitary, pineal, and cerebellar tonsils unremarkable. No upper cervical lesions. Flow voids are maintained throughout the carotid, basilar, and vertebral arteries. There are no areas of chronic hemorrhage. Post infusion, there is enhancement of an incidental LEFT posterior frontal extra-axial mass, falx based, 8 x 16 x 14 mm, without significant mass effect on the adjacent brain or ingrowth into the superior sagittal sinus, consistent with a meningioma. No other similar lesions. Major dural venous sinuses patent. Visualized calvarium, skull base, and upper cervical osseous structures unremarkable. Scalp and extracranial soft tissues, orbits, and sinuses show no acute process. RIGHT mastoid fluid of uncertain significance. No evidence for right-sided otitis. Correlate clinically as a source of headache. MRA HEAD FINDINGS The internal carotid arteries are widely patent. The basilar artery is widely patent. Vertebrals are codominant. No intracranial stenosis or aneurysm of significance. Fetal origin LEFT PCA gives rise to mild irregularity of the proximal LEFT P1, normal variant. IMPRESSION: Atrophy and small vessel disease.  No acute intracranial findings. Right-sided mastoid fluid without associated otitis. Correlate clinically as a source of headache. Incidental 8 x 16 x 14 mm LEFT posterior frontal parasagittal meningioma, without mass effect on the underlying brain. No intracranial vascular abnormality of significance. Electronically Signed   By: Staci Righter M.D.   On: 04/08/2015 16:36   Mr Jeri Cos [id number] Contrast  04/08/2015  CLINICAL DATA:  Headache for 5 days increasing in intensity. Negative workup 04/03/2015.  Blurred vision began today EXAM: MRI HEAD WITHOUT AND WITH CONTRAST MRA HEAD WITHOUT CONTRAST TECHNIQUE: Multiplanar, multiecho pulse sequences of the brain and surrounding structures were obtained without and with intravenous contrast. Angiographic images of the head were obtained using MRA technique without contrast. CONTRAST:  78mL MULTIHANCE GADOBENATE DIMEGLUMINE 529 MG/ML IV SOLN COMPARISON:  CT head 04/03/2015. FINDINGS: MRI HEAD FINDINGS No evidence for acute infarction, hemorrhage, hydrocephalus, or extra-axial fluid. Generalized atrophy. Chronic microvascular ischemic change of a mild-to-moderate degree. Pituitary, pineal, and cerebellar tonsils unremarkable. No upper cervical lesions. Flow voids are maintained throughout the carotid, basilar, and vertebral arteries. There are no areas  of chronic hemorrhage. Post infusion, there is enhancement of an incidental LEFT posterior frontal extra-axial mass, falx based, 8 x 16 x 14 mm, without significant mass effect on the adjacent brain or ingrowth into the superior sagittal sinus, consistent with a meningioma. No other similar lesions. Major dural venous sinuses patent. Visualized calvarium, skull base, and upper cervical osseous structures unremarkable. Scalp and extracranial soft tissues, orbits, and sinuses show no acute process. RIGHT mastoid fluid of uncertain significance. No evidence for right-sided otitis. Correlate clinically as a source of headache. MRA HEAD FINDINGS The internal carotid arteries are widely patent. The basilar artery is widely patent. Vertebrals are codominant. No intracranial stenosis or aneurysm of significance. Fetal origin LEFT PCA gives rise to mild irregularity of the proximal LEFT P1, normal variant. IMPRESSION: Atrophy and small vessel disease.  No acute intracranial findings. Right-sided mastoid fluid without associated otitis. Correlate clinically as a source of headache. Incidental 8 x 16 x 14 mm LEFT posterior frontal  parasagittal meningioma, without mass effect on the underlying brain. No intracranial vascular abnormality of significance. Electronically Signed   By: Staci Righter M.D.   On: 04/08/2015 16:36   I have personally reviewed and evaluated these images and lab results as part of my medical decision-making.   EKG Interpretation None      MDM   Final diagnoses:  Cranial nerve VI palsy, right    4:01 PM BP 128/64 mmHg  Pulse 74  Temp(Src) 98.5 F (36.9 C) (Oral)  Resp 18  Ht 5\' 4"  (1.626 m)  Wt 48.988 kg  BMI 18.53 kg/m2  SpO2 99% Patient MRI results as noted above in the chart. No acute stroke is vizualized. I have placed a consult ot neurohospitalist.  I spoke with Dr. Aram Beecham who feels that the patient may have had a very small stroke causing the palsy today that is not visualized on the MRI. Doubt any other cause such as bell's palsy or myasthenia gravis.   Patient admitted to hospitalist service by Dr. Doyle Askew for stroke work up. The patient appears reasonably screened and/or stabilized for discharge and I doubt any other medical condition or other [facility] requiring further screening, evaluation, or treatment in the ED at this time prior to discharge.     Margarita Mail, PA-C 04/09/15 [place], MD 04/10/15 [phone]

## 2015-04-08 NOTE — Progress Notes (Signed)
Patient was seen and evaluated as an outpatient at [facility] today by Julian Reil. She reports a five day history of right sided headache. She was seen in ED 04/03/2015 and CT showed no acute intracranial infarct. She came to my clinic today 04/08/2015 with persistent headache and new double vision four 24 hours.  She was found to have a right sixth nerve palsy and VF defects concerning for stroke vs. Cavernous sinus pathology. .  I've recommended that she go to the Emergency room for further evaluation. Recommend MRI/MRA.     Please do not hesitate to contact my office with questions or concerns.  Office: [id number] Cell: [phone]

## 2015-04-08 NOTE — Consult Note (Addendum)
Referring Physician: Dr Rex Kras    Chief Complaint: acute onset diplopia  HPI:                                                                                                                                         Sydney [name] is an 81 y.o. female with a past medical history significant for HTN, HLD, TIA many years ago, and osteoporosis, brought in by daughter for evaluation of double vision. Patient said that she never had similar symptoms before. She was evaluated in this ED on 12/7/ due to HA and had a CT brain that was unremarkable for acute abnormality and she was sent home.  Daughter is at the bedside and said that for the next 2 days she had some improving HA but this past Saturday had some visual changes and was evaluated by her eye doctor and no specific cause was identified. Then, yesterday afternoon developed acute onset double vision that prompted an evaluation by opthalmology today and she was sent to the ED for MRI/MRA brain due to concern for acute stroke. Patent said that the double vision goes away when she closed an eye. Worse when she looks farther away or to the right. No associated eye pain, eyelid droopiness, HA, vertigo, difficulty swallowing, focal weakness or numbness, slurred speech, imbalance, or vision loss.  MRI.MRA brain performed today were personally reviewed and showed no acute abnormality.   Date last known well: 04/07/15 Time last known well: unable to determine tPA Given: no, out of the window   Past Medical History  Diagnosis Date  . Hypertension   . Osteoporosis 02/2014    T score -2.5  followed by Dr. Lysle Rubens  . TIA (transient ischemic attack) 1990's  . High cholesterol     "on RX years ago" (02/17/2013)  . Arthritis     "left hand" (02/17/2013)  . Palpitations     PACs, PVCs and short runs of atrial tachycardia on Holter monitoring    Past Surgical History  Procedure Laterality Date  . Tonsillectomy    . Anal fistulectomy  1970    Family  History  Problem Relation Age of Onset  . Hypertension Father   . Heart attack Father   . Stroke Brother   . Stroke Maternal Grandmother    Social History:  reports that she has never smoked. She has never used smokeless tobacco. She reports that she does not drink alcohol or use illicit drugs. Family history: no MS, epilepsy, or brain tumor Allergies:  Allergies  Allergen Reactions  . Demerol [Meperidine] Other (See Comments)    Excessive sweating, cramping  . Codeine Rash  . Sulfa Antibiotics Other (See Comments)    Reaction unknown  . Tomato Itching    Medications:  I have reviewed the patient's current medications.  ROS:                                                                                                                                       History obtained from daughter, chart review and the patient  General ROS: negative for - chills, fatigue, fever, night sweats, weight gain or weight loss Psychological ROS: negative for - behavioral disorder, hallucinations, memory difficulties, mood swings or suicidal ideation Ophthalmic ROS: negative for - blurry vision, eye pain or loss of vision ENT ROS: negative for - epistaxis, nasal discharge, oral lesions, sore throat, tinnitus or vertigo Allergy and Immunology ROS: negative for - hives or itchy/watery eyes Hematological and Lymphatic ROS: negative for - bleeding problems, bruising or swollen lymph nodes Endocrine ROS: negative for - galactorrhea, hair pattern changes, polydipsia/polyuria or temperature intolerance Respiratory ROS: negative for - cough, hemoptysis, shortness of breath or wheezing Cardiovascular ROS: negative for - chest pain, dyspnea on exertion, edema or irregular heartbeat Gastrointestinal ROS: negative for - abdominal pain, diarrhea, hematemesis, nausea/vomiting or stool  incontinence Genito-Urinary ROS: negative for - dysuria, hematuria, incontinence or urinary frequency/urgency Musculoskeletal ROS: negative for - joint swelling or muscular weakness Neurological ROS: as noted in HPI Dermatological ROS: negative for rash and skin lesion changes   Physical exam:  Constitutional: well developed, pleasant female in no apparent distress. Blood pressure 155/70, pulse 56, temperature 97.4 F (36.3 C), temperature source Oral, resp. rate 10, height '5\' 4"'  (1.626 m), weight 48.988 kg (108 lb), SpO2 100 %. Eyes: no jaundice or exophthalmos.  Head: normocephalic. Neck: supple, no bruits, no JVD. Cardiac: no murmurs. Lungs: clear. Abdomen: soft, no tender, no mass. Extremities: no edema, clubbing, or cyanosis.  Skin: no rash     Neurologic Examination:                                                                                                      General: NAD Mental Status: Alert, oriented, thought content appropriate.  Speech fluent without evidence of aphasia.  Able to follow 3 step commands without difficulty. Cranial Nerves: II: Discs flat bilaterally; Visual fields grossly normal, pupils equal, round, reactive to light and accommodation III,IV, VI: ptosis not present, mild weakness on abduction right eye  V,VII: smile symmetric, facial light touch sensation normal bilaterally VIII: hearing normal bilaterally IX,X: uvula rises symmetrically XI: bilateral shoulder shrug XII: midline tongue extension without atrophy or fasciculations  Motor: Right : Upper extremity  5/5    Left:     Upper extremity   5/5  Lower extremity   5/5     Lower extremity   5/5 Tone and bulk:normal tone throughout; no atrophy noted Sensory: Pinprick and light touch intact throughout, bilaterally Deep Tendon Reflexes:  Right: Upper Extremity   Left: Upper extremity   biceps (C-5 to C-6) 2/4   biceps (C-5 to C-6) 2/4 tricep (C7) 2/4    triceps (C7) 2/4 Brachioradialis  (C6) 2/4  Brachioradialis (C6) 2/4  Lower Extremity Lower Extremity  quadriceps (L-2 to L-4) 2/4   quadriceps (L-2 to L-4) 2/4 Achilles (S1) 2/4   Achilles (S1) 2/4  Plantars: Right: downgoing   Left: downgoing Cerebellar: normal finger-to-nose,  normal heel-to-shin test Gait:  No tested due to multiple leads  Results for orders placed or performed during the hospital encounter of 04/08/15 (from the past 48 hour(s))  Protime-INR     Status: None   Collection Time: 04/08/15 12:50 PM  Result Value Ref Range   Prothrombin Time 14.8 11.6 - 15.2 seconds   INR 1.14 0.00 - 1.49  APTT     Status: None   Collection Time: 04/08/15 12:50 PM  Result Value Ref Range   aPTT 31 24 - 37 seconds  CBC     Status: Abnormal   Collection Time: 04/08/15 12:50 PM  Result Value Ref Range   WBC 3.0 (L) 4.0 - 10.5 K/uL   RBC 3.99 3.87 - 5.11 MIL/uL   Hemoglobin 11.9 (L) 12.0 - 15.0 g/dL   HCT 37.1 36.0 - 46.0 %   MCV 93.0 78.0 - 100.0 fL   MCH 29.8 26.0 - 34.0 pg   MCHC 32.1 30.0 - 36.0 g/dL   RDW 12.8 11.5 - 15.5 %   Platelets 246 150 - 400 K/uL  Differential     Status: None   Collection Time: 04/08/15 12:50 PM  Result Value Ref Range   Neutrophils Relative % 62 %   Neutro Abs 1.8 1.7 - 7.7 K/uL   Lymphocytes Relative 23 %   Lymphs Abs 0.7 0.7 - 4.0 K/uL   Monocytes Relative 15 %   Monocytes Absolute 0.5 0.1 - 1.0 K/uL   Eosinophils Relative 0 %   Eosinophils Absolute 0.0 0.0 - 0.7 K/uL   Basophils Relative 0 %   Basophils Absolute 0.0 0.0 - 0.1 K/uL  Comprehensive metabolic panel     Status: Abnormal   Collection Time: 04/08/15 12:50 PM  Result Value Ref Range   Sodium 137 135 - 145 mmol/L   Potassium 3.7 3.5 - 5.1 mmol/L   Chloride 104 101 - 111 mmol/L   CO2 27 22 - 32 mmol/L   Glucose, Bld 94 65 - 99 mg/dL   BUN 7 6 - 20 mg/dL   Creatinine, Ser 0.61 0.44 - 1.00 mg/dL   Calcium 8.8 (L) 8.9 - 10.3 mg/dL   Total Protein 6.8 6.5 - 8.1 g/dL   Albumin 3.3 (L) 3.5 - 5.0 g/dL   AST 31  15 - 41 U/L   ALT 25 14 - 54 U/L   Alkaline Phosphatase 78 38 - 126 U/L   Total Bilirubin 0.5 0.3 - 1.2 mg/dL   GFR calc non Af Amer >60 >60 mL/min   GFR calc Af Amer >60 >60 mL/min    Comment: (NOTE) The eGFR has been calculated using the CKD EPI equation. This calculation has not been validated in all clinical situations. eGFR's persistently <60 mL/min signify  possible Chronic Kidney Disease.    Anion gap 6 5 - 15  I-stat troponin, ED (not at [facility], [facility])     Status: None   Collection Time: 04/08/15  1:01 PM  Result Value Ref Range   Troponin i, poc 0.00 0.00 - 0.08 ng/mL   Comment 3            Comment: Due to the release kinetics of cTnI, a negative result within the first hours of the onset of symptoms does not rule out myocardial infarction with certainty. If myocardial infarction is still suspected, repeat the test at appropriate intervals.   I-Stat Chem 8, ED  (not at [facility], [facility])     Status: Abnormal   Collection Time: 04/08/15  1:05 PM  Result Value Ref Range   Sodium 140 135 - 145 mmol/L   Potassium 3.7 3.5 - 5.1 mmol/L   Chloride 100 (L) 101 - 111 mmol/L   BUN 9 6 - 20 mg/dL   Creatinine, Ser 0.60 0.44 - 1.00 mg/dL   Glucose, Bld 89 65 - 99 mg/dL   Calcium, Ion 1.18 1.13 - 1.30 mmol/L   TCO2 27 0 - 100 mmol/L   Hemoglobin 13.9 12.0 - 15.0 g/dL   HCT 41.0 36.0 - 46.0 %   Mr Angiogram Head Wo Contrast  04/08/2015  CLINICAL DATA:  Headache for 5 days increasing in intensity. Negative workup 04/03/2015. Blurred vision began today EXAM: MRI HEAD WITHOUT AND WITH CONTRAST MRA HEAD WITHOUT CONTRAST TECHNIQUE: Multiplanar, multiecho pulse sequences of the brain and surrounding structures were obtained without and with intravenous contrast. Angiographic images of the head were obtained using MRA technique without contrast. CONTRAST:  58m MULTIHANCE GADOBENATE DIMEGLUMINE 529 MG/ML IV SOLN COMPARISON:  CT head 04/03/2015. FINDINGS: MRI HEAD FINDINGS No evidence for acute  infarction, hemorrhage, hydrocephalus, or extra-axial fluid. Generalized atrophy. Chronic microvascular ischemic change of a mild-to-moderate degree. Pituitary, pineal, and cerebellar tonsils unremarkable. No upper cervical lesions. Flow voids are maintained throughout the carotid, basilar, and vertebral arteries. There are no areas of chronic hemorrhage. Post infusion, there is enhancement of an incidental LEFT posterior frontal extra-axial mass, falx based, 8 x 16 x 14 mm, without significant mass effect on the adjacent brain or ingrowth into the superior sagittal sinus, consistent with a meningioma. No other similar lesions. Major dural venous sinuses patent. Visualized calvarium, skull base, and upper cervical osseous structures unremarkable. Scalp and extracranial soft tissues, orbits, and sinuses show no acute process. RIGHT mastoid fluid of uncertain significance. No evidence for right-sided otitis. Correlate clinically as a source of headache. MRA HEAD FINDINGS The internal carotid arteries are widely patent. The basilar artery is widely patent. Vertebrals are codominant. No intracranial stenosis or aneurysm of significance. Fetal origin LEFT PCA gives rise to mild irregularity of the proximal LEFT P1, normal variant. IMPRESSION: Atrophy and small vessel disease.  No acute intracranial findings. Right-sided mastoid fluid without associated otitis. Correlate clinically as a source of headache. Incidental 8 x 16 x 14 mm LEFT posterior frontal parasagittal meningioma, without mass effect on the underlying brain. No intracranial vascular abnormality of significance. Electronically Signed   By: JStaci RighterM.D.   On: 04/08/2015 16:36   Mr BJeri CosWVOContrast  04/08/2015  CLINICAL DATA:  Headache for 5 days increasing in intensity. Negative workup 04/03/2015. Blurred vision began today EXAM: MRI HEAD WITHOUT AND WITH CONTRAST MRA HEAD WITHOUT CONTRAST TECHNIQUE: Multiplanar, multiecho pulse sequences of the  brain and surrounding structures were  obtained without and with intravenous contrast. Angiographic images of the head were obtained using MRA technique without contrast. CONTRAST:  59m MULTIHANCE GADOBENATE DIMEGLUMINE 529 MG/ML IV SOLN COMPARISON:  CT head 04/03/2015. FINDINGS: MRI HEAD FINDINGS No evidence for acute infarction, hemorrhage, hydrocephalus, or extra-axial fluid. Generalized atrophy. Chronic microvascular ischemic change of a mild-to-moderate degree. Pituitary, pineal, and cerebellar tonsils unremarkable. No upper cervical lesions. Flow voids are maintained throughout the carotid, basilar, and vertebral arteries. There are no areas of chronic hemorrhage. Post infusion, there is enhancement of an incidental LEFT posterior frontal extra-axial mass, falx based, 8 x 16 x 14 mm, without significant mass effect on the adjacent brain or ingrowth into the superior sagittal sinus, consistent with a meningioma. No other similar lesions. Major dural venous sinuses patent. Visualized calvarium, skull base, and upper cervical osseous structures unremarkable. Scalp and extracranial soft tissues, orbits, and sinuses show no acute process. RIGHT mastoid fluid of uncertain significance. No evidence for right-sided otitis. Correlate clinically as a source of headache. MRA HEAD FINDINGS The internal carotid arteries are widely patent. The basilar artery is widely patent. Vertebrals are codominant. No intracranial stenosis or aneurysm of significance. Fetal origin LEFT PCA gives rise to mild irregularity of the proximal LEFT P1, normal variant. IMPRESSION: Atrophy and small vessel disease.  No acute intracranial findings. Right-sided mastoid fluid without associated otitis. Correlate clinically as a source of headache. Incidental 8 x 16 x 14 mm LEFT posterior frontal parasagittal meningioma, without mass effect on the underlying brain. No intracranial vascular abnormality of significance. Electronically Signed   By:  JStaci RighterM.D.   On: 04/08/2015 16:36     Assessment: 81y.o. female with acute onset isolated binocular diplopia. Neuro-exam significant for mild paresis right VI cranial nerve (lateral rectus muscle). MRI brain without acute infarct. MRA brain unremarkable. Suspect MRI negative pontine infarct, most likely due to small vessel disese. Aspirin. Complete stroke work up.   Stroke Risk Factors - age,  HTN, HLD, TIA  Plan: 1. HgbA1c, fasting lipid panel 2. MRI, MRA  of the brain without contrast 3. Echocardiogram 4. Carotid dopplers 5. Prophylactic therapy-aspirin 6. Risk factor modification 7. Telemetry monitoring 8. Frequent neuro checks 9. PT/OT SLP  ODorian Pod MD [facility] [phone] 04/08/2015, 6:30 PM

## 2015-04-09 ENCOUNTER — Inpatient Hospital Stay (HOSPITAL_COMMUNITY): Payer: Medicare Other

## 2015-04-09 ENCOUNTER — Telehealth: Payer: Self-pay | Admitting: Neurology

## 2015-04-09 ENCOUNTER — Encounter (HOSPITAL_COMMUNITY): Payer: Medicare Other

## 2015-04-09 DIAGNOSIS — H4911 Fourth [trochlear] nerve palsy, right eye: Secondary | ICD-10-CM

## 2015-04-09 DIAGNOSIS — I6789 Other cerebrovascular disease: Secondary | ICD-10-CM

## 2015-04-09 DIAGNOSIS — H532 Diplopia: Secondary | ICD-10-CM

## 2015-04-09 DIAGNOSIS — I639 Cerebral infarction, unspecified: Secondary | ICD-10-CM

## 2015-04-09 LAB — BASIC METABOLIC PANEL
Anion gap: 8 (ref 5–15)
BUN: 5 mg/dL — AB (ref 6–20)
CHLORIDE: 103 mmol/L (ref 101–111)
CO2: 26 mmol/L (ref 22–32)
Calcium: 8.7 mg/dL — ABNORMAL LOW (ref 8.9–10.3)
Creatinine, Ser: 0.59 mg/dL (ref 0.44–1.00)
GFR calc Af Amer: >60 mL/min (ref >60)
GFR calc non Af Amer: >60 mL/min (ref >60)
GLUCOSE: 88 mg/dL (ref 65–99)
POTASSIUM: 3.7 mmol/L (ref 3.5–5.1)
Sodium: 137 mmol/L (ref 135–145)

## 2015-04-09 LAB — CBC
HEMATOCRIT: 35.8 % — AB (ref 36.0–46.0)
HEMOGLOBIN: 11.9 g/dL — AB (ref 12.0–15.0)
MCH: 30.6 pg (ref 26.0–34.0)
MCHC: 33.2 g/dL (ref 30.0–36.0)
MCV: 92 fL (ref 78.0–100.0)
Platelets: 244 10*3/uL (ref 150–400)
RBC: 3.89 MIL/uL (ref 3.87–5.11)
RDW: 12.8 % (ref 11.5–15.5)
WBC: 2.5 10*3/uL — ABNORMAL LOW (ref 4.0–10.5)

## 2015-04-09 LAB — LIPID PANEL
CHOL/HDL RATIO: 2.9 ratio
CHOL/HDL RATIO: 3.1 ratio
CHOLESTEROL: 171 mg/dL (ref 0–200)
CHOLESTEROL: 159 mg/dL (ref 0–200)
HDL: 51 mg/dL (ref >40)
HDL: 58 mg/dL (ref >40)
LDL Cholesterol: 104 mg/dL — ABNORMAL HIGH (ref 0–99)
LDL Cholesterol: 95 mg/dL (ref 0–99)
TRIGLYCERIDES: 44 mg/dL (ref <150)
Triglycerides: 63 mg/dL (ref <150)
VLDL: 13 mg/dL (ref 0–40)
VLDL: 9 mg/dL (ref 0–40)

## 2015-04-09 LAB — SEDIMENTATION RATE: Sed Rate: 53 mm/hr — ABNORMAL HIGH (ref 0–22)

## 2015-04-09 MED ORDER — PREDNISONE 10 MG (21) PO TBPK: ORAL_TABLET | ORAL | Status: DC

## 2015-04-09 NOTE — Progress Notes (Signed)
Pt discharged from hospital per MD orders. Pt and daughter educated on discharge instructions and follow up visit with neurologist. Pt and daughter verbalized understanding of instructions. All questions and concerns were addressed. Pt's IV was removed by RN before discharge. Pt exited hospital via ambulation.

## 2015-04-09 NOTE — Progress Notes (Addendum)
Occupational Therapy Evaluation/Discharge Patient Details Name: Sydney [name] MRN: [id number] DOB: [birth year] Today's Date: 04/09/2015    History of Present Illness 81 y.o. female admitted for sudden onset of diplopia. PMH significant for HTN, hyperlipidemia, osteoporosis. CT (-) and MRI (-) for acute abnormality.   Clinical Impression   PTA, pt was independent with all ADLs and functional mobility. Pt currently presents with diplopia in R visual field and mildly impaired balance. Pt completed all transfers and ADL tasks with supervision for balance, but pt did not require any physical assist. Provided with HEP with eye exercises and pt demonstrated understanding of these exercises. Completed all other education and pt has no further questions. Recommending Neuro Outpatient OT for follow up with double vision. Pt has no further acute OT needs. OT signing off.    Follow Up Recommendations  Outpatient OT (Neuro)- for diplopia    Equipment Recommendations  None recommended by OT    Recommendations for Other Services       Precautions / Restrictions Precautions Precautions: None Precaution Comments: double vision on R side Restrictions Weight Bearing Restrictions: No      Mobility Bed Mobility Overal bed mobility: Modified Independent             General bed mobility comments: HOB flat, no use of bedrails to simulate home environment  Transfers Overall transfer level: Modified independent Equipment used: None             General transfer comment: No cues needed and no LOB occurred.    Balance Overall balance assessment: Needs assistance Sitting-balance support: No upper extremity supported;Feet supported Sitting balance-Leahy Scale: Normal     Standing balance support: No upper extremity supported;During functional activity Standing balance-Leahy Scale: Good Standing balance comment: Slight swaying to R side when ambulating in hallway but no overt LOB or  physical assist needed                            ADL Overall ADL's : Needs assistance/impaired     Grooming: Wash/dry hands;Supervision/safety;Standing               Lower Body Dressing: Supervision/safety;Sit to/from stand   Toilet Transfer: Supervision/safety;Ambulation;Regular Toilet   Toileting- Water quality scientist and Hygiene: Supervision/safety;Sit to/from stand   Tub/ Shower Transfer: Tub transfer;Minimal assistance;Ambulation   Functional mobility during ADLs: Supervision/safety General ADL Comments: Supervision for all ADL tasks and functional mobility except min assist to stand up from sitting in tub for tub transfer. Pt with double vision with objects in R visual when sitting in chair and ambulating in hallway. Pt with slight sway to R side when ambulating in hallway but no overt LOB or physical assist needed. Provided/practiced HEP for diplopia/eye exercises.      Vision Vision Assessment?: Yes Eye Alignment: Within Functional Limits Alignment/Gaze Preference: Within Defined Limits Saccades: Additional eye shifts occurred during testing Convergence: Within functional limits Diplopia Assessment: Disappears with one eye closed;Objects split side to side;Only with right gaze;Present in far gaze;Objects split on top of one another;Present in near gaze Depth Perception: Undershoots Additional Comments: Provided/practiced HEP for eye exercises   Perception     Praxis      Pertinent Vitals/Pain Pain Assessment: No/denies pain     Hand Dominance Right   Extremity/Trunk Assessment Upper Extremity Assessment Upper Extremity Assessment: Overall WFL for tasks assessed   Lower Extremity Assessment Lower Extremity Assessment: Overall WFL for tasks assessed   Cervical / Trunk  Assessment Cervical / Trunk Assessment: Kyphotic   Communication Communication Communication: No difficulties   Cognition Arousal/Alertness: Awake/alert Behavior During  Therapy: WFL for tasks assessed/performed Overall Cognitive Status: Within Functional Limits for tasks assessed                     General Comments       Exercises Exercises: Other exercises Other Exercises Other Exercises: Eyes: Up and down (L eye closed and both eyes open) Other Exercises: Eyes: Side to side (L eye closed and both eyes open) Other Exercises: Eyes: Diagonally (L eye closed and both eyes open) Other Exercises: Eyes: Smooth pursuits (L eye closed and both eyes open)   Shoulder Instructions      Home Living Family/patient expects to be discharged to:: Private residence Living Arrangements: Children Available Help at Discharge: Family;Available PRN/intermittently Type of Home: House Home Access: Stairs to enter [employer] of Steps: 4 Entrance Stairs-Rails: Can reach both Home Layout: One level     Bathroom Shower/Tub: Tub/shower unit;Curtain Shower/tub characteristics: Architectural technologist: Standard     Home Equipment: Radio producer - single point   Additional Comments: Daughter works during the day      Prior Functioning/Environment Level of Independence: Independent        Comments: Not currently driving    OT Diagnosis: Disturbance of vision   OT Problem List: Impaired balance (sitting and/or standing);Impaired vision/perception   OT Treatment/Interventions:      OT Goals(Current goals can be found in the care plan section) Acute Rehab OT Goals Patient Stated Goal: to go home OT Goal Formulation: With patient Time For Goal Achievement: 04/23/15 Potential to Achieve Goals: Good  OT Frequency:     Barriers to D/C:            Co-evaluation              End of Session Equipment Utilized During Treatment: Gait belt Nurse Communication: Mobility status  Activity Tolerance: Patient tolerated treatment well Patient left: in chair;with call bell/phone within reach;with chair alarm set   Time: [id number] OT Time  Calculation (min): 25 min Charges:  OT General Charges $OT Visit: 1 Procedure OT Evaluation $Initial OT Evaluation Tier I: 1 Procedure OT Treatments $Self Care/Home Management : 8-22 mins G-Codes:    Redmond Baseman, OTR/L 04-30-15, 3:55 PM

## 2015-04-09 NOTE — Progress Notes (Signed)
SLP Cancellation Note  Patient Details Name: LALAH CERRO MRN: [id number] DOB: 12-30-1930   Cancelled treatment:       Reason Eval/Treat Not Completed: Patient at procedure or test/unavailable.  SLP will follow up as able.  Gunnar Fusi, M.A., CCC-SLP [phone]  [place] 04/09/2015, 10:40 AM

## 2015-04-09 NOTE — Progress Notes (Signed)
Physical Therapy Evaluation Patient Details Name: Sydney [name] MRN: [id number] DOB: 18-Dec-1930 Today's Date: 04/09/2015   History of Present Illness  81 year old female with known hypertension, hyperlipidemia, osteoporosis presented to [facility] emergency department for evaluation of sudden onset of double vision that started one day prior to this admission. Patient went to see ophthalmologist and was referred for an admission to [facility] for further evaluation. Review of records indicated patient has presented to the emergency department on December 7 with headaches and CT of the brain at that time was unremarkable and patient was sent home. Patient explains her headaches improved since that time but one day prior to this admission she developed diplopia. She denies similar events in the past, no blindness, no dizziness or lightheadedness. Patient also denies numbness and tingling in upper or lower extremities, no loss of sensation throughout the body. Patient also denies fevers and chills, no abdominal or urinary concerns, no chest pain or shortness of breath    Clinical Impression  Pt admitted with above diagnosis and presents at baseline level of mobility. Pt overall modified independent to supervision (for safety due to double vision still present) without assitive device. Discussed with pt and daughter. At this time, pt does not require acute PT services. Thank you for the referral and if needs arise, please re-order PT.         Follow Up Recommendations No PT follow up;Supervision - Intermittent    Equipment Recommendations  None recommended by PT    Recommendations for Other Services       Precautions / Restrictions Precautions Precaution Comments: double vision Restrictions Weight Bearing Restrictions: No      Mobility  Bed Mobility Overal bed mobility: Modified Independent             General bed mobility comments: HOB was elevated  Transfers Overall transfer  level: Modified independent Equipment used: None             General transfer comment: No cues needed and no LOB occurred.  Ambulation/Gait Ambulation/Gait assistance: Supervision Ambulation Distance (Feet): 150 Feet Assistive device: None       General Gait Details: slighly flexed posture. No LOB occurred with gait on unit or during turns. Pt reports double vision is present all the time but has not affected her balance at this point.  Stairs            Wheelchair Mobility    Modified Rankin (Stroke Patients Only) Modified Rankin (Stroke Patients Only) Pre-Morbid Rankin Score: No symptoms Modified Rankin: No significant disability (double vision)     Balance Overall balance assessment: Needs assistance Sitting-balance support: No upper extremity supported;Feet supported Sitting balance-Leahy Scale: Normal     Standing balance support: No upper extremity supported;During functional activity Standing balance-Leahy Scale: Good Standing balance comment: No LOB or unsteadiness noted with mobility. Supervision due to double vision.                             Pertinent Vitals/Pain Pain Assessment: No/denies pain    Home Living Family/patient expects to be discharged to:: Private residence Living Arrangements: Children Available Help at Discharge: Family;Available PRN/intermittently Type of Home: House Home Access: Stairs to enter Entrance Stairs-Rails: Can reach both Entrance Stairs-Number of Steps: 4 Home Layout: One level Home Equipment: None      Prior Function Level of Independence: Independent         Comments: Pt reports her daughter  would drive but she still went out in the communit and enjoys shopping to [employer]. She also reports she would cook and clean in the home without any issues.     Hand Dominance        Extremity/Trunk Assessment   Upper Extremity Assessment: Defer to OT evaluation           Lower Extremity  Assessment: Overall WFL for tasks assessed      Cervical / Trunk Assessment: Kyphotic  Communication   Communication: No difficulties  Cognition Arousal/Alertness: Awake/alert Behavior During Therapy: WFL for tasks assessed/performed Overall Cognitive Status: Within Functional Limits for tasks assessed                      General Comments General comments (skin integrity, edema, etc.): Pt's double vision resolves when one eye closed.    Exercises        Assessment/Plan    PT Assessment Patent does not need any further PT services  PT Diagnosis     PT Problem List    PT Treatment Interventions     PT Goals (Current goals can be found in the Care Plan section) Acute Rehab PT Goals Patient Stated Goal: get home to get my Christmas tree up PT Goal Formulation: All assessment and education complete, DC therapy    Frequency     Barriers to discharge        Co-evaluation               End of Session   Activity Tolerance: Patient tolerated treatment well Patient left: in bed;with call bell/phone within reach;with bed alarm set;with family/visitor present Nurse Communication: Mobility status;Other (comment) (would be ok to move in room with daughter present)         Time: [id number] PT Time Calculation (min) (ACUTE ONLY): 10 min   Charges:   PT Evaluation $Initial PT Evaluation Tier I: 1 Procedure     PT G Codes:        Juanna Cao, PT, DPT Pager #: [phone]  04/09/2015, 11:41 AM

## 2015-04-09 NOTE — Telephone Encounter (Signed)
Please see other telephone note from today, I discussed the case with him, we will be seeing the patient in January.

## 2015-04-09 NOTE — Progress Notes (Signed)
Pt arrived to unit via stretcher from the ED.  Pt oriented to unit, plan of care and staff.  Vitals and assessment stable, see flowsheets.  Tele applied and central monitoring notified.  Bed low, call bell within reach and bed alarm activated.  Will continue to monitor.

## 2015-04-09 NOTE — Progress Notes (Signed)
SLP Cancellation Note  Patient Details Name: Sydney [name] MRN: [id number] DOB: 20-Nov-1930   Cancelled treatment:       Reason Eval/Treat Not Completed: SLP screened, no needs identified, will sign off.  Patient and daughter report that she is at her baseline level of functioning with the exception of her vision.    Gunnar Fusi, M.A., CCC-SLP [phone]  [place] 04/09/2015, 2:27 PM

## 2015-04-09 NOTE — Telephone Encounter (Signed)
Dr. Coralyn Pear called requesting to speak with Dr. Jannifer Franklin regarding new patient for cranial nerve 6 palsy, please call back on cell phone [phone].

## 2015-04-09 NOTE — Telephone Encounter (Signed)
This patient is being discharged today from the hospital, she presented with a VI nerve palsy. MRI the brain did not show acute changes, she does have a history of hypertension. She has a sedimentation rate of 53. She will be discharged on a prednisone taper, we will see her in early January, she will need be followed for the sedimentation rate.

## 2015-04-09 NOTE — Progress Notes (Signed)
  Echocardiogram 2D Echocardiogram has been performed.  Sydney [name] 04/09/2015, 10:22 AM

## 2015-04-09 NOTE — Progress Notes (Signed)
NEURO HOSPITALIST PROGRESS NOTE   SUBJECTIVE:                                                                                                                        Double vision remains unchanged, but otherwise she has no new neurological complains. MRI/MRA brain without acute abnormality or other lesion that can explain her diplopia and right VI nerve palsy. CUS, TTE unremarkable.  Cholesterol 159, triglycerides 63, HDL 51, LDL 95. Hemoglobin A1c pending. Sed rate 53. On ASA 81 mg daily.   OBJECTIVE:                                                                                                                           Vital signs in last 24 hours: Temp:  [98 F (36.7 C)-98.5 F (36.9 C)] 98.5 F (36.9 C) (12/13 1400) Pulse Rate:  [49-74] 74 (12/13 1400) Resp:  [10-20] 18 (12/13 1400) BP: (121-170)/(63-77) 128/64 mmHg (12/13 1400) SpO2:  [99 %-100 %] 99 % (12/13 1400)  Intake/Output from previous day:   Intake/Output this shift:   Nutritional status: Diet Heart Room service appropriate?: Yes; Fluid consistency:: Thin  Past Medical History  Diagnosis Date  . Hypertension   . Osteoporosis 02/2014    T score -2.5  followed by Dr. Lysle Rubens  . TIA (transient ischemic attack) 1990's  . High cholesterol     "on RX years ago" (02/17/2013)  . Arthritis     "left hand" (02/17/2013)  . Palpitations     PACs, PVCs and short runs of atrial tachycardia on Holter monitoring   Physical exam:  Constitutional: well developed, pleasant female in no apparent distress. Eyes: no jaundice or exophthalmos.  Head: normocephalic. Neck: supple, no bruits, no JVD. Cardiac: no murmurs. Lungs: clear. Abdomen: soft, no tender, no mass. Extremities: no edema, clubbing, or cyanosis.  Skin: no rash   Neurologic Exam:  General: NAD Mental Status: Alert, oriented, thought content appropriate. Speech fluent without evidence of aphasia. Able to follow 3  step commands without difficulty. Cranial Nerves: II: Discs flat bilaterally; Visual fields grossly normal, pupils equal, round, reactive to light and accommodation III,IV, VI: ptosis not present, mild weakness on abduction right eye  V,VII: smile symmetric, facial light  touch sensation normal bilaterally VIII: hearing normal bilaterally IX,X: uvula rises symmetrically XI: bilateral shoulder shrug XII: midline tongue extension without atrophy or fasciculations  Motor: Right :Upper extremity 5/5Left: Upper extremity 5/5 Lower extremity 5/5Lower extremity 5/5 Tone and bulk:normal tone throughout; no atrophy noted Sensory: Pinprick and light touch intact throughout, bilaterally Deep Tendon Reflexes:  Right: Upper Extremity Left: Upper extremity   biceps (C-5 to C-6) 2/4 biceps (C-5 to C-6) 2/4 tricep (C7) 2/4triceps (C7) 2/4 Brachioradialis (C6) 2/4Brachioradialis (C6) 2/4  Lower Extremity Lower Extremity  quadriceps (L-2 to L-4) 2/4 quadriceps (L-2 to L-4) 2/4 Achilles (S1) 2/4Achilles (S1) 2/4  Plantars: Right: downgoingLeft: downgoing Cerebellar: normal finger-to-nose, normal heel-to-shin test Gait:  No tested due to multiple leads  Lab Results: Lab Results  Component Value Date/Time   CHOL 159 04/09/2015 07:50 AM   Lipid Panel  Recent Labs  04/09/15 0750  CHOL 159  TRIG 63  HDL 51  CHOLHDL 3.1  VLDL 13  LDLCALC 95    Studies/Results: Dg Chest 2 View  04/08/2015  CLINICAL DATA:  Acute onset of headache and right eye weakness. Initial encounter. EXAM: CHEST  2 VIEW COMPARISON:  Chest radiograph performed 02/17/2013 FINDINGS: The lungs are hyperexpanded, with  flattening of the hemidiaphragms, compatible with COPD. Small bilateral pleural effusions are noted, with mild bibasilar opacities likely reflecting atelectasis. There is no evidence of pneumothorax. The heart is normal in size; the mediastinal contour is within normal limits. No acute osseous abnormalities are seen. IMPRESSION: 1. Small bilateral pleural effusions, with mild bibasilar airspace opacities likely reflecting atelectasis. 2. Findings of COPD. Electronically Signed   By: Garald Balding M.D.   On: 04/08/2015 23:35   Mr Angiogram Head Wo Contrast  04/08/2015  CLINICAL DATA:  Headache for 5 days increasing in intensity. Negative workup 04/03/2015. Blurred vision began today EXAM: MRI HEAD WITHOUT AND WITH CONTRAST MRA HEAD WITHOUT CONTRAST TECHNIQUE: Multiplanar, multiecho pulse sequences of the brain and surrounding structures were obtained without and with intravenous contrast. Angiographic images of the head were obtained using MRA technique without contrast. CONTRAST:  41mL MULTIHANCE GADOBENATE DIMEGLUMINE 529 MG/ML IV SOLN COMPARISON:  CT head 04/03/2015. FINDINGS: MRI HEAD FINDINGS No evidence for acute infarction, hemorrhage, hydrocephalus, or extra-axial fluid. Generalized atrophy. Chronic microvascular ischemic change of a mild-to-moderate degree. Pituitary, pineal, and cerebellar tonsils unremarkable. No upper cervical lesions. Flow voids are maintained throughout the carotid, basilar, and vertebral arteries. There are no areas of chronic hemorrhage. Post infusion, there is enhancement of an incidental LEFT posterior frontal extra-axial mass, falx based, 8 x 16 x 14 mm, without significant mass effect on the adjacent brain or ingrowth into the superior sagittal sinus, consistent with a meningioma. No other similar lesions. Major dural venous sinuses patent. Visualized calvarium, skull base, and upper cervical osseous structures unremarkable. Scalp and extracranial soft tissues, orbits, and  sinuses show no acute process. RIGHT mastoid fluid of uncertain significance. No evidence for right-sided otitis. Correlate clinically as a source of headache. MRA HEAD FINDINGS The internal carotid arteries are widely patent. The basilar artery is widely patent. Vertebrals are codominant. No intracranial stenosis or aneurysm of significance. Fetal origin LEFT PCA gives rise to mild irregularity of the proximal LEFT P1, normal variant. IMPRESSION: Atrophy and small vessel disease.  No acute intracranial findings. Right-sided mastoid fluid without associated otitis. Correlate clinically as a source of headache. Incidental 8 x 16 x 14 mm LEFT posterior frontal parasagittal meningioma, without mass effect on the underlying brain. No  intracranial vascular abnormality of significance. Electronically Signed   By: Staci Righter M.D.   On: 04/08/2015 16:36   Mr Jeri Cos [id number] Contrast  04/08/2015  CLINICAL DATA:  Headache for 5 days increasing in intensity. Negative workup 04/03/2015. Blurred vision began today EXAM: MRI HEAD WITHOUT AND WITH CONTRAST MRA HEAD WITHOUT CONTRAST TECHNIQUE: Multiplanar, multiecho pulse sequences of the brain and surrounding structures were obtained without and with intravenous contrast. Angiographic images of the head were obtained using MRA technique without contrast. CONTRAST:  62mL MULTIHANCE GADOBENATE DIMEGLUMINE 529 MG/ML IV SOLN COMPARISON:  CT head 04/03/2015. FINDINGS: MRI HEAD FINDINGS No evidence for acute infarction, hemorrhage, hydrocephalus, or extra-axial fluid. Generalized atrophy. Chronic microvascular ischemic change of a mild-to-moderate degree. Pituitary, pineal, and cerebellar tonsils unremarkable. No upper cervical lesions. Flow voids are maintained throughout the carotid, basilar, and vertebral arteries. There are no areas of chronic hemorrhage. Post infusion, there is enhancement of an incidental LEFT posterior frontal extra-axial mass, falx based, 8 x 16 x 14 mm,  without significant mass effect on the adjacent brain or ingrowth into the superior sagittal sinus, consistent with a meningioma. No other similar lesions. Major dural venous sinuses patent. Visualized calvarium, skull base, and upper cervical osseous structures unremarkable. Scalp and extracranial soft tissues, orbits, and sinuses show no acute process. RIGHT mastoid fluid of uncertain significance. No evidence for right-sided otitis. Correlate clinically as a source of headache. MRA HEAD FINDINGS The internal carotid arteries are widely patent. The basilar artery is widely patent. Vertebrals are codominant. No intracranial stenosis or aneurysm of significance. Fetal origin LEFT PCA gives rise to mild irregularity of the proximal LEFT P1, normal variant. IMPRESSION: Atrophy and small vessel disease.  No acute intracranial findings. Right-sided mastoid fluid without associated otitis. Correlate clinically as a source of headache. Incidental 8 x 16 x 14 mm LEFT posterior frontal parasagittal meningioma, without mass effect on the underlying brain. No intracranial vascular abnormality of significance. Electronically Signed   By: Staci Righter M.D.   On: 04/08/2015 16:36    MEDICATIONS                                                                                                                        Scheduled: . aspirin EC  81 mg Oral Daily  . clopidogrel  75 mg Oral Daily  . enoxaparin (LOVENOX) injection  30 mg Subcutaneous Q24H  . folic acid  1 mg Oral Daily  . 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 Interesting case. Can not entirely exclude a microvascular etiology (although the truth of the matter is that she has no HTN or DM), less likely an  inflammatory cause. She had HA x 2 days prior to developing diplopia, sed rated is modestly elevated (53) but the HA has resolved and thus doubt VI nerve palsy as a manifestation of an temporal arteritis which has been previously described in the literature.  Will be in favor of an empiric short trial of steroids considering the slight sed rate elevation and HA at onset of presentation. 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 Neurology will sign off.    Dorian Pod, MD [facility] [phone]  04/09/2015, 3:19 PM

## 2015-04-09 NOTE — Discharge Summary (Signed)
Physician Discharge Summary  Sydney [name] [id number] DOB: [birth year] DOA: 04/08/2015  PCP: [name], MD  Admit date: 04/08/2015 Discharge date: 04/09/2015  Time spent: 35 minutes  Recommendations for Outpatient Follow-up:  1. Patient presented with complaints of diplopia, further worked up with MRI of brain that did not show acute CVA. 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 She was discharged on prednisone taper and given a neurology appointment. Please follow-up on visual changes 2. She was given a follow up appointment to See Dr Jannifer Franklin of Neurology on May 01 2015 at 2 pm   Discharge Diagnoses:  Principal Problem:   Cranial nerve IV palsy Active Problems:   Bradycardia   Diplopia   Leukopenia   Discharge Condition: Stable  Diet recommendation: Heart Healthy  Filed Weights   04/08/15 1220  Weight: 48.988 kg (108 lb)    History of present illness:  Patient is very pleasant 81 year old female with known hypertension, hyperlipidemia, osteoporosis presented to [facility] emergency department for evaluation of sudden onset of double vision that started one day prior to this admission. Patient went to see ophthalmologist and was referred for an admission to [facility] for further evaluation. Review of records indicated patient has presented to the emergency department on December 7 with headaches and CT of the brain at that time was unremarkable and patient was sent home. Patient explains her headaches improved since that time but one day prior to this admission she developed diplopia. She denies similar events in the past, no blindness, no dizziness or lightheadedness. Patient also denies numbness and tingling in upper or lower extremities, no loss of sensation throughout the body. Patient also denies fevers and chills, no abdominal or urinary concerns, no chest pain or shortness of breath.  Hospital Course:  Sydney [name] is a pleasant 81 year old female  with a history of hypertension, admitted to [facility] on 04/08/2015 when she presented with complaints of diplopia. She was found to have partial 6 cranial nerve palsy and worked up with an MRI of brain that did not reveal acute CVA. On physical exam she had no other focal deficits. Her only complaint was diplopia as she was found to have mild weakness with abduction of her right eye. Neurology was consulted. Unclear what the cause of her partial 6 greater than palsy, she denied a history of trauma. MRI did not show stroke or tumor. Inflammatory process possibility having said rate of 54. Neurology did not feel this reflected temporal arteritis. Possibilities include MRI negative stroke or perhaps viral infection. She otherwise remained stable during her hospitalization. She was continued on aspirin and Plavix therapy. Since the rest of her workup was unremarkable she was discharged to home in stable condition on 04/09/2015. I personally set her up with a follow-up appointment with neurology for 05/01/2015 at 2 PM. She was discharged on a short course of steroids.   Procedures:  2D Echo Impression: Left ventricle: The cavity size was normal. Systolic function was normal. The estimated ejection fraction was in the range of 55% to 60%. Wall motion was normal; there were no regional wall motion abnormalities. There was an increased relative contribution of atrial contraction to ventricular filling. Doppler parameters are consistent with abnormal left ventricular relaxation (grade 1 diastolic dysfunction).  Consultations:  Neurology  Discharge Exam: Filed Vitals:   04/09/15 0814 04/09/15 1400  BP: 131/68 128/64  Pulse: 62 74  Temp: 98.3 F (36.8 C) 98.5 F (36.9 C)  Resp: 16 18  General: She is in no acute distress, ambulating down the hallway continues to report diplopia Cardiovascular: Regular rate and rhythm normal S1S2 Respiratory: Normal inspiratory  effort Abdomen: Soft, nontender nondistended Neuro: Patient having mild abduction to her right eye. There was no facial droop or slurred speech. Had 5 of 5 muscle strength to bilateral upper and lower extremities  Discharge Instructions   Discharge Instructions    Call MD for:  difficulty breathing, headache or visual disturbances    Complete by:  As directed      Call MD for:  extreme fatigue    Complete by:  As directed      Call MD for:  hives    Complete by:  As directed      Call MD for:  persistant dizziness or light-headedness    Complete by:  As directed      Call MD for:  persistant nausea and vomiting    Complete by:  As directed      Call MD for:  redness, tenderness, or signs of infection (pain, swelling, redness, odor or green/yellow discharge around incision site)    Complete by:  As directed      Call MD for:  severe uncontrolled pain    Complete by:  As directed      Call MD for:  temperature >100.4    Complete by:  As directed      Call MD for:    Complete by:  As directed      Diet - low sodium heart healthy    Complete by:  As directed      Increase activity slowly    Complete by:  As directed           Current Discharge Medication List    START taking these medications   Details  predniSONE (STERAPRED UNI-PAK 21 TAB) 10 MG (21) TBPK tablet Take 6-5-4-3-2-1 tablets by mouth daily till gone. Qty: 21 tablet, Refills: 0      CONTINUE these medications which have NOT CHANGED   Details  aspirin (ECOTRIN LOW STRENGTH) 81 MG EC tablet Take 81 mg by mouth daily. Swallow whole.    cholecalciferol (VITAMIN D) 1000 UNITS tablet Take 1,000 Units by mouth daily.     clopidogrel (PLAVIX) 75 MG tablet Take 75 mg by mouth daily.    folic acid (FOLVITE) 1 MG tablet Take 1 mg by mouth daily.    ibandronate (BONIVA) 150 MG tablet Take 1 tablet by mouth every 30 (thirty) days. Refills: 1    ketoconazole (NIZORAL) 2 % shampoo Apply 1 application topically daily as  needed for irritation.     loratadine (CLARITIN) 10 MG tablet Take 10 mg by mouth daily as needed for allergies (for seasonal allergies).    metoprolol (LOPRESSOR) 50 MG tablet Take 25 mg by mouth 2 (two) times daily. 1/2 TAB PO BID Refills: 1    traMADol (ULTRAM) 50 MG tablet Take 50 mg by mouth daily as needed. Refills: 0    fluticasone (FLONASE) 50 MCG/ACT nasal spray Place 2 sprays into both nostrils daily. Qty: 15.8 g, Refills: 0       Allergies  Allergen Reactions  . Demerol [Meperidine] Other (See Comments)    Excessive sweating, cramping  . Codeine Rash  . Sulfa Antibiotics Other (See Comments)    Reaction unknown  . Tomato Itching   Follow-up Information    Follow up with Lenor Coffin, MD On 05/01/2015.   Specialty:  Neurology  Why:  Appointment time at 2 pm, please be there at 1:30   Contact information:   [address] [place] [address] [phone]       Follow up with [name], MD In 3 weeks.   Specialty:  Internal Medicine   Contact information:   [address] [place] [address] [phone]        The results of significant diagnostics from this hospitalization (including imaging, microbiology, ancillary and laboratory) are listed below for reference.    Significant Diagnostic Studies: Dg Chest 2 View  04/08/2015  CLINICAL DATA:  Acute onset of headache and right eye weakness. Initial encounter. EXAM: CHEST  2 VIEW COMPARISON:  Chest radiograph performed 02/17/2013 FINDINGS: The lungs are hyperexpanded, with flattening of the hemidiaphragms, compatible with COPD. Small bilateral pleural effusions are noted, with mild bibasilar opacities likely reflecting atelectasis. There is no evidence of pneumothorax. The heart is normal in size; the mediastinal contour is within normal limits. No acute osseous abnormalities are seen. IMPRESSION: 1. Small bilateral pleural effusions, with mild bibasilar airspace  opacities likely reflecting atelectasis. 2. Findings of COPD. Electronically Signed   By: Garald Balding M.D.   On: 04/08/2015 23:35   Ct Head Wo Contrast  04/03/2015  CLINICAL DATA:  81 year old with sudden onset of headache above right eye. EXAM: CT HEAD WITHOUT CONTRAST TECHNIQUE: Contiguous axial images were obtained from the base of the skull through the vertex without intravenous contrast. COMPARISON:  01/11/2014 FINDINGS: Prominence of the sulci and ventricles are identified consistent with brain atrophy. There is mild diffuse low attenuation within the subcortical and periventricular white matter compatible with chronic microvascular disease. There is a smoothly marginated left anterior parafalcine pleural-based mass which measures 1.7 by 0.8 cm, image 26 of series 2. This has increased in size from previous exam when it measured 0.9 x 0.6 cm. This is favored to represent a benign meningioma. No evidence for acute brain infarct, acute intracranial hemorrhage or mass. The paranasal sinuses and the mastoid air cells appear clear. The calvarium is intact. IMPRESSION: 1. No acute intracranial abnormalities. 2. Left anterior parafalcine pleural based lesion is identified which is favored to represent a benign meningioma. Electronically Signed   By: Kerby Moors M.D.   On: 04/03/2015 21:24   Mr Angiogram Head Wo Contrast  04/08/2015  CLINICAL DATA:  Headache for 5 days increasing in intensity. Negative workup 04/03/2015. Blurred vision began today EXAM: MRI HEAD WITHOUT AND WITH CONTRAST MRA HEAD WITHOUT CONTRAST TECHNIQUE: Multiplanar, multiecho pulse sequences of the brain and surrounding structures were obtained without and with intravenous contrast. Angiographic images of the head were obtained using MRA technique without contrast. CONTRAST:  24mL MULTIHANCE GADOBENATE DIMEGLUMINE 529 MG/ML IV SOLN COMPARISON:  CT head 04/03/2015. FINDINGS: MRI HEAD FINDINGS No evidence for acute infarction,  hemorrhage, hydrocephalus, or extra-axial fluid. Generalized atrophy. Chronic microvascular ischemic change of a mild-to-moderate degree. Pituitary, pineal, and cerebellar tonsils unremarkable. No upper cervical lesions. Flow voids are maintained throughout the carotid, basilar, and vertebral arteries. There are no areas of chronic hemorrhage. Post infusion, there is enhancement of an incidental LEFT posterior frontal extra-axial mass, falx based, 8 x 16 x 14 mm, without significant mass effect on the adjacent brain or ingrowth into the superior sagittal sinus, consistent with a meningioma. No other similar lesions. Major dural venous sinuses patent. Visualized calvarium, skull base, and upper cervical osseous structures unremarkable. Scalp and extracranial soft tissues, orbits, and sinuses show no acute  process. RIGHT mastoid fluid of uncertain significance. No evidence for right-sided otitis. Correlate clinically as a source of headache. MRA HEAD FINDINGS The internal carotid arteries are widely patent. The basilar artery is widely patent. Vertebrals are codominant. No intracranial stenosis or aneurysm of significance. Fetal origin LEFT PCA gives rise to mild irregularity of the proximal LEFT P1, normal variant. IMPRESSION: Atrophy and small vessel disease.  No acute intracranial findings. Right-sided mastoid fluid without associated otitis. Correlate clinically as a source of headache. Incidental 8 x 16 x 14 mm LEFT posterior frontal parasagittal meningioma, without mass effect on the underlying brain. No intracranial vascular abnormality of significance. Electronically Signed   By: Staci Righter M.D.   On: 04/08/2015 16:36   Mr Jeri Cos [id number] Contrast  04/08/2015  CLINICAL DATA:  Headache for 5 days increasing in intensity. Negative workup 04/03/2015. Blurred vision began today EXAM: MRI HEAD WITHOUT AND WITH CONTRAST MRA HEAD WITHOUT CONTRAST TECHNIQUE: Multiplanar, multiecho pulse sequences of the brain and  surrounding structures were obtained without and with intravenous contrast. Angiographic images of the head were obtained using MRA technique without contrast. CONTRAST:  59mL MULTIHANCE GADOBENATE DIMEGLUMINE 529 MG/ML IV SOLN COMPARISON:  CT head 04/03/2015. FINDINGS: MRI HEAD FINDINGS No evidence for acute infarction, hemorrhage, hydrocephalus, or extra-axial fluid. Generalized atrophy. Chronic microvascular ischemic change of a mild-to-moderate degree. Pituitary, pineal, and cerebellar tonsils unremarkable. No upper cervical lesions. Flow voids are maintained throughout the carotid, basilar, and vertebral arteries. There are no areas of chronic hemorrhage. Post infusion, there is enhancement of an incidental LEFT posterior frontal extra-axial mass, falx based, 8 x 16 x 14 mm, without significant mass effect on the adjacent brain or ingrowth into the superior sagittal sinus, consistent with a meningioma. No other similar lesions. Major dural venous sinuses patent. Visualized calvarium, skull base, and upper cervical osseous structures unremarkable. Scalp and extracranial soft tissues, orbits, and sinuses show no acute process. RIGHT mastoid fluid of uncertain significance. No evidence for right-sided otitis. Correlate clinically as a source of headache. MRA HEAD FINDINGS The internal carotid arteries are widely patent. The basilar artery is widely patent. Vertebrals are codominant. No intracranial stenosis or aneurysm of significance. Fetal origin LEFT PCA gives rise to mild irregularity of the proximal LEFT P1, normal variant. IMPRESSION: Atrophy and small vessel disease.  No acute intracranial findings. Right-sided mastoid fluid without associated otitis. Correlate clinically as a source of headache. Incidental 8 x 16 x 14 mm LEFT posterior frontal parasagittal meningioma, without mass effect on the underlying brain. No intracranial vascular abnormality of significance. Electronically Signed   By: Staci Righter  M.D.   On: 04/08/2015 16:36    Microbiology: No results found for this or any previous visit (from the past 240 hour(s)).   Labs: Basic Metabolic Panel:  Recent Labs Lab 04/03/15 1849 04/08/15 1250 04/08/15 1305 04/09/15 0750  NA 138 137 140 137  K 3.8 3.7 3.7 3.7  CL 108 104 100* 103  CO2 25 27  --  26  GLUCOSE 111* 94 89 88  BUN 13 7 9  5*  CREATININE 0.69 0.61 0.60 0.59  CALCIUM 9.1 8.8*  --  8.7*   Liver Function Tests:  Recent Labs Lab 04/08/15 1250  AST 31  ALT 25  ALKPHOS 78  BILITOT 0.5  PROT 6.8  ALBUMIN 3.3*   No results for input(s): LIPASE, AMYLASE in the last 168 hours. No results for input(s): AMMONIA in the last 168 hours. CBC:  Recent Labs Lab  04/03/15 1849 04/08/15 1250 04/08/15 1305 04/09/15 0750  WBC 3.3* 3.0*  --  2.5*  NEUTROABS  --  1.8  --   --   HGB 11.0* 11.9* 13.9 11.9*  HCT 33.9* 37.1 41.0 35.8*  MCV 92.6 93.0  --  92.0  PLT 217 246  --  244   Cardiac Enzymes: No results for input(s): CKTOTAL, CKMB, CKMBINDEX, TROPONINI in the last 168 hours. BNP: BNP (last 3 results) No results for input(s): BNP in the last 8760 hours.  ProBNP (last 3 results) No results for input(s): PROBNP in the last 8760 hours.  CBG: No results for input(s): GLUCAP in the last 168 hours.     SignedKelvin Cellar  [facility] 04/09/2015, 4:02 PM

## 2015-04-09 NOTE — Progress Notes (Signed)
VASCULAR LAB PRELIMINARY  PRELIMINARY  PRELIMINARY  PRELIMINARY  Carotid duplex completed.    Preliminary report:  Bilateral:  1-39% ICA stenosis.  Vertebral artery flow is antegrade.     Sydney [name], RVS 04/09/2015, 11:26 AM

## 2015-04-10 LAB — HEMOGLOBIN A1C
HEMOGLOBIN A1C: 5.9 % — AB (ref 4.8–5.6)
HEMOGLOBIN A1C: 5.8 % — AB (ref 4.8–5.6)
MEAN PLASMA GLUCOSE: 120 mg/dL
Mean Plasma Glucose: 123 mg/dL

## 2015-05-01 ENCOUNTER — Ambulatory Visit (INDEPENDENT_AMBULATORY_CARE_PROVIDER_SITE_OTHER): Payer: Medicare Other | Admitting: Neurology

## 2015-05-01 ENCOUNTER — Encounter: Payer: Self-pay | Admitting: Neurology

## 2015-05-01 VITALS — BP 123/73 | HR 54 | Ht 64.0 in | Wt 106.5 lb

## 2015-05-01 DIAGNOSIS — H532 Diplopia: Secondary | ICD-10-CM | POA: Diagnosis not present

## 2015-05-01 DIAGNOSIS — D329 Benign neoplasm of meninges, unspecified: Secondary | ICD-10-CM

## 2015-05-01 HISTORY — DX: Benign neoplasm of meninges, unspecified: D32.9

## 2015-05-01 NOTE — Progress Notes (Signed)
Reason for visit: Double vision  Referring physician: [facility]  Sydney [name] is a 82 y.o. female  History of present illness:  Sydney [name] is an 82 year old right-handed black female with a history of onset of headache that occurred around 04/06/2015. The headache initially was in the right frontotemporal area, and the family noted some mild ptosis on that right eye as well. The patient went to the hospital, and a CT scan of the brain was done that was unremarkable. Within the next day, she began noting that her right eye felt "funny". The patient was seen by an eye doctor, and the initial evaluation was unremarkable. Within the next day or so, she began having overt double vision, and she went to the emergency room for an evaluation. The double vision had primarily a horizontal component, occasionally a slight vertical component as well. MRI of the brain showed chronic small vessel disease, no acute changes were seen. A sedimentation rate was elevated at 53, the patient has evidence of neutropenia, but this apparently is a chronic issue for her. The patient was placed on prednisone, and she is currently off of this medication. She has had full resolution of the double vision without recurrence, and the ptosis has improved. The patient has not had any recurrence of headache. MRI of the brain did show a small meningioma in the vertex of the head. The patient reports no focal numbness or weakness of the face, arms, or legs. She denies any slurred speech or difficulty swallowing. She is sent to this office for evaluation. She denies any weight loss, malaise or fatigue.  Past Medical History  Diagnosis Date  . Hypertension   . Osteoporosis 02/2014    T score -2.5  followed by Dr. Lysle Rubens  . TIA (transient ischemic attack) 1990's  . High cholesterol     "on RX years ago" (02/17/2013)  . Arthritis     "left hand" (02/17/2013)  . Palpitations     PACs, PVCs and short runs of atrial  tachycardia on Holter monitoring  . Meningioma ([place]) 05/01/2015    Past Surgical History  Procedure Laterality Date  . Tonsillectomy    . Anal fistulectomy  1970    Family History  Problem Relation Age of Onset  . Hypertension Father   . Heart attack Father   . Stroke Brother   . Stroke Maternal Grandmother     Social history:  reports that she has never smoked. She has never used smokeless tobacco. She reports that she does not drink alcohol or use illicit drugs.  Medications:  Prior to Admission medications   Medication Sig Start Date End Date Taking? Authorizing Provider  aspirin (ECOTRIN LOW STRENGTH) 81 MG EC tablet Take 81 mg by mouth daily. Swallow whole.   Yes Historical Provider, MD  cholecalciferol (VITAMIN D) 1000 UNITS tablet Take 1,000 Units by mouth daily.    Yes Historical Provider, MD  clopidogrel (PLAVIX) 75 MG tablet Take 75 mg by mouth daily.   Yes Historical Provider, MD  fluticasone (FLONASE) 50 MCG/ACT nasal spray Place 2 sprays into both nostrils daily. 01/11/14  Yes Linton Flemings, MD  folic acid (FOLVITE) 1 MG tablet Take 1 mg by mouth daily.   Yes Historical Provider, MD  ibandronate (BONIVA) 150 MG tablet Take 1 tablet by mouth every 30 (thirty) days. 03/20/14  Yes Historical Provider, MD  ketoconazole (NIZORAL) 2 % shampoo Apply 1 application topically daily as needed for irritation.  12/19/13  Yes  Historical Provider, MD  loratadine (CLARITIN) 10 MG tablet Take 10 mg by mouth daily as needed for allergies (for seasonal allergies).   Yes Historical Provider, MD  metoprolol (LOPRESSOR) 50 MG tablet Take 25 mg by mouth 2 (two) times daily. 1/2 TAB PO BID 03/14/15  Yes Historical Provider, MD  traMADol (ULTRAM) 50 MG tablet Take 50 mg by mouth daily as needed. 04/04/15  Yes Historical Provider, MD      Allergies  Allergen Reactions  . Demerol [Meperidine] Other (See Comments)    Excessive sweating, cramping  . Codeine Rash  . Sulfa Antibiotics Other (See  Comments)    Reaction unknown  . Tomato Itching    ROS:  Out of a complete 14 system review of symptoms, the patient complains only of the following symptoms, and all other reviewed systems are negative.  Transient double vision, headache  Blood pressure 123/73, pulse 54, height 5\' 4"  (1.626 m), weight 106 lb 8 oz (48.308 kg).  Physical Exam  General: The patient is alert and cooperative at the time of the examination.  Eyes: Pupils are equal, round, and reactive to light. Discs are flat bilaterally.  Neck: The neck is supple, no carotid bruits are noted.  Respiratory: The respiratory examination is clear.  Cardiovascular: The cardiovascular examination reveals a regular rate and rhythm, no obvious murmurs or rubs are noted.  Skin: Extremities are without significant edema.  Neurologic Exam  Mental status: The patient is alert and oriented x 3 at the time of the examination. The patient has apparent normal recent and remote memory, with an apparently normal attention span and concentration ability.  Cranial nerves: Facial symmetry is present. There is good sensation of the face to pinprick and soft touch bilaterally. The strength of the facial muscles and the muscles to head turning and shoulder shrug are normal bilaterally. Speech is well enunciated, no aphasia or dysarthria is noted. Extraocular movements are full. Visual fields are full. The tongue is midline, and the patient has symmetric elevation of the soft palate. No obvious hearing deficits are noted.  Motor: The motor testing reveals 5 over 5 strength of all 4 extremities. Good symmetric motor tone is noted throughout.  Sensory: Sensory testing is intact to pinprick, soft touch, vibration sensation, and position sense on all 4 extremities. No evidence of extinction is noted.  Coordination: Cerebellar testing reveals good finger-nose-finger and heel-to-shin bilaterally.  Gait and station: Gait is normal. Tandem gait is  normal. Romberg is negative. No drift is seen.  Reflexes: Deep tendon reflexes are symmetric and normal bilaterally. Toes are downgoing bilaterally.   MRI brain 04/08/15:  IMPRESSION: Atrophy and small vessel disease. No acute intracranial findings.  Right-sided mastoid fluid without associated otitis. Correlate clinically as a source of headache.  Incidental 8 x 16 x 14 mm LEFT posterior frontal parasagittal meningioma, without mass effect on the underlying brain.  No intracranial vascular abnormality of significance.  * MRI scan images were reviewed online. I agree with the written report.    Assessment/Plan:  1. Headache and double vision, resolved  The patient has had some slight ptosis, horizontal and slight vertical diplopia that has resolved. This may have represented a mild third nerve palsy on the right. MRI of the brain did not show acute changes. We will recheck blood work today to include a sedimentation rate, ANA, acetylcholine receptor antibody level, thyroid profile. If the patient continues to do well, she will follow-up on an as-needed basis. If there are any  recurrence of symptoms the patient will call our office.  Jill Alexanders MD 05/01/2015 6:12 PM  [facility] [address] [place], [place] [address]  Phone [phone] Fax [phone]

## 2015-05-01 NOTE — Patient Instructions (Signed)
Diplopia °Diplopia is the condition of having double vision or seeing two of a single object. There are many causes of diplopia. Some are not dangerous and can be easily corrected. Diplopia may also be a symptom of a serious medical problem. °There are two types of diplopia. °· Monocular diplopia. This is double vision that affects only one eye. Monocular diplopia is often caused by a clouding of the lens in your eye (cataract) or by disruptions in the way that your eye focuses light. °· Binocular diplopia. This is double vision that affects both eyes. However, when you shut one eye, the double vision will go away. Binocular diplopia may be more serious. It can be caused by: °¨ Problems with the nerves or muscles that are responsible for eye movement. °¨ Neurologic diseases. °¨ Thyroid problems. °¨ Tumors. °¨ An infection near your eyes. °¨ A stroke. °You may need to see a health care provider who specializes in eye conditions (ophthalmologist) or a nerve specialist (neurologist) to find the cause. °HOME CARE INSTRUCTIONS °· Tell your health care provider about any changes in your vision. °· Do not drive or operate heavy machinery if diplopia interferes with your vision. °· Keep all follow-up visits as directed by your health care provider. This is important. °SEEK MEDICAL CARE IF: °· Your diplopia gets worse. °· You develop any other symptoms along with your diplopia, such as: °¨ Weakness. °¨ Numbness. °¨ Headache. °¨ Eye pain. °¨ Clumsiness. °¨ Nausea. °¨ Drooping eyelids. °¨ Abnormal movement of one of your eyes. °SEEK IMMEDIATE MEDICAL CARE IF: °· You have sudden vision loss. °· You suddenly get a very bad headache. °· You have sudden weakness or numbness. °· You suddenly lose the ability to speak, understand speech, or both. °  °This information is not intended to replace advice given to you by your health care provider. Make sure you discuss any questions you have with your health care provider. °  °Document  Released: 02/13/2004 Document Revised: 08/28/2014 Document Reviewed: 03/07/2014 °Elsevier Interactive Patient Education ©2016 [employer] ° °

## 2015-05-02 LAB — ANA W/REFLEX: ANA: NEGATIVE

## 2015-05-02 LAB — CBC WITH DIFFERENTIAL/PLATELET
BASOS: 0 %
Basophils Absolute: 0 10*3/uL (ref 0.0–0.2)
EOS (ABSOLUTE): 0 10*3/uL (ref 0.0–0.4)
Eos: 1 %
HEMOGLOBIN: 10.7 g/dL — AB (ref 11.1–15.9)
Hematocrit: 32.9 % — ABNORMAL LOW (ref 34.0–46.6)
IMMATURE GRANS (ABS): 0 10*3/uL (ref 0.0–0.1)
IMMATURE GRANULOCYTES: 0 %
LYMPHS: 36 %
Lymphocytes Absolute: 1 10*3/uL (ref 0.7–3.1)
MCH: 29.9 pg (ref 26.6–33.0)
MCHC: 32.5 g/dL (ref 31.5–35.7)
MCV: 92 fL (ref 79–97)
MONOCYTES: 10 %
Monocytes Absolute: 0.3 10*3/uL (ref 0.1–0.9)
NEUTROS ABS: 1.5 10*3/uL (ref 1.4–7.0)
NEUTROS PCT: 53 %
Platelets: 321 10*3/uL (ref 150–379)
RBC: 3.58 x10E6/uL — ABNORMAL LOW (ref 3.77–5.28)
RDW: 13 % (ref 12.3–15.4)
WBC: 2.8 10*3/uL — ABNORMAL LOW (ref 3.4–10.8)

## 2015-05-02 LAB — TSH: TSH: 1.8 u[IU]/mL (ref 0.450–4.500)

## 2015-05-02 LAB — ANGIOTENSIN CONVERTING ENZYME: ANGIO CONVERT ENZYME: 33 U/L (ref 14–82)

## 2015-05-02 LAB — RPR: RPR Ser Ql: NONREACTIVE

## 2015-05-02 LAB — SEDIMENTATION RATE: SED RATE: 21 mm/h (ref 0–40)

## 2015-05-02 LAB — ACETYLCHOLINE RECEPTOR, BINDING: AChR Binding Ab, Serum: 0.07 nmol/L (ref 0.00–0.24)

## 2015-05-02 LAB — B. BURGDORFI ANTIBODIES

## 2015-10-02 ENCOUNTER — Other Ambulatory Visit: Payer: Self-pay | Admitting: Internal Medicine

## 2015-10-02 DIAGNOSIS — Z1231 Encounter for screening mammogram for malignant neoplasm of breast: Secondary | ICD-10-CM

## 2015-11-05 ENCOUNTER — Ambulatory Visit
Admission: RE | Admit: 2015-11-05 | Discharge: 2015-11-05 | Disposition: A | Discharge status: Home / Self Care | Payer: Medicare Other | Source: Ambulatory Visit | Attending: Internal Medicine | Admitting: Internal Medicine

## 2015-11-05 DIAGNOSIS — Z1231 Encounter for screening mammogram for malignant neoplasm of breast: Secondary | ICD-10-CM

## 2016-04-02 ENCOUNTER — Ambulatory Visit (INDEPENDENT_AMBULATORY_CARE_PROVIDER_SITE_OTHER): Payer: Medicare Other | Admitting: Gynecology

## 2016-04-02 ENCOUNTER — Encounter: Payer: Self-pay | Admitting: Gynecology

## 2016-04-02 VITALS — BP 122/80 | Ht 63.0 in | Wt 107.0 lb

## 2016-04-02 DIAGNOSIS — M810 Age-related osteoporosis without current pathological fracture: Secondary | ICD-10-CM

## 2016-04-02 DIAGNOSIS — L03811 Cellulitis of head [any part, except face]: Secondary | ICD-10-CM

## 2016-04-02 DIAGNOSIS — N952 Postmenopausal atrophic vaginitis: Secondary | ICD-10-CM | POA: Diagnosis not present

## 2016-04-02 DIAGNOSIS — Z01411 Encounter for gynecological examination (general) (routine) with abnormal findings: Secondary | ICD-10-CM | POA: Diagnosis not present

## 2016-04-02 MED ORDER — DOXYCYCLINE HYCLATE 100 MG PO CAPS: 100.0000 mg | ORAL_CAPSULE | Freq: Two times a day (BID) | ORAL | 0 refills | Status: DC

## 2016-04-02 NOTE — Patient Instructions (Signed)
Take the antibiotic twice daily for 7 days.  If your ear continues to bother you our certainly gets worse then follow up with your primary physician.

## 2016-04-02 NOTE — Progress Notes (Signed)
    Sydney [name] November 13, 1930 [id number]        82 y.o.  G1P1001 for breast and pelvic exam. Patient additionally complaining of left external ear pain. Noticed it for one to 2 days. Does not note any trauma or drainage. No fever or chills. Workup in the morning with it hurting.  Past medical history,surgical history, problem list, medications, allergies, family history and social history were all reviewed and documented as reviewed in the EPIC chart.  ROS:  Performed with pertinent positives and negatives included in the history, assessment and plan.   Additional significant findings :  None   Exam: Sydney [name] assistant Vitals:   04/02/16 1007  BP: 122/80  Weight: 107 lb (48.5 kg)  Height: 5\' 3"  (1.6 m)   Body mass index is 18.95 kg/m.  General appearance:  Normal affect, orientation and appearance. Skin: Grossly normal HEENT: Without gross lesions.  No cervical or supraclavicular adenopathy. Thyroid normal. Upper outer left ear swollen with warmth and erythema. No pointing or obvious trauma. Lungs:  Clear without wheezing, rales or rhonchi Cardiac: RR, without RMG Abdominal:  Soft, nontender, without masses, guarding, rebound, organomegaly or hernia Breasts:  Examined lying and sitting without masses, retractions, discharge or axillary adenopathy. Pelvic:  Ext, BUS, Vagina with atrophic changes  Cervix atrophic flush with upper vagina  Uterus difficult to palpate but no gross masses or tenderness  Adnexa without masses or tenderness    Anus and perineum normal   Rectovaginal normal sphincter tone without palpated masses or tenderness.    Assessment/Plan:  82 y.o. G45P1001 female for breast and pelvic exam.  1. Left outer ear cellulitis. Questionable etiology. Will treat with Vibramycin 100 mg twice a day 7 days. Heat to the area. Patient knows if persists or certainly worsens over the next several days to follow up ASAP with her primary physician for further evaluation and  treatment. 2. Osteoporosis. DEXA 2015 T score -2.5. Being treated by Dr. Deforest Hoyles she'll continue to follow up with them in reference to this. 3. Mammography 10/2015. Continue with annual mammography when due. SBE monthly reviewed. 4. Colonoscopy 2009. Repeat at their recommended interval. 5. Pap smear 2013. No Pap smear done today. No history of abnormal Pap smears. We previously discussed current screening guidelines and both are comfortable with stop screening. 6. Health maintenance. No routine lab work done as patient does this elsewhere. Follow up 1 year, sooner as needed.  Additional time in excess of her breast and pelvic exam was spent in direct face to face counseling and coordination of care in regards to her left ear cellulitis with evaluation and treatment.    [name] MD, 10:45 AM 04/02/2016

## 2016-04-12 ENCOUNTER — Encounter (HOSPITAL_COMMUNITY): Payer: Self-pay | Admitting: Emergency Medicine

## 2016-04-12 ENCOUNTER — Emergency Department (HOSPITAL_COMMUNITY)
Admission: EM | Admit: 2016-04-12 | Discharge: 2016-04-12 | Disposition: A | Discharge status: Home / Self Care | Payer: Medicare Other | Attending: Emergency Medicine | Admitting: Emergency Medicine

## 2016-04-12 DIAGNOSIS — Z8673 Personal history of transient ischemic attack (TIA), and cerebral infarction without residual deficits: Secondary | ICD-10-CM | POA: Insufficient documentation

## 2016-04-12 DIAGNOSIS — Z7982 Long term (current) use of aspirin: Secondary | ICD-10-CM | POA: Diagnosis not present

## 2016-04-12 DIAGNOSIS — K5900 Constipation, unspecified: Secondary | ICD-10-CM | POA: Insufficient documentation

## 2016-04-12 DIAGNOSIS — I10 Essential (primary) hypertension: Secondary | ICD-10-CM | POA: Insufficient documentation

## 2016-04-12 DIAGNOSIS — Z79899 Other long term (current) drug therapy: Secondary | ICD-10-CM | POA: Insufficient documentation

## 2016-04-12 MED ORDER — POLYETHYLENE GLYCOL 3350 17 G PO PACK: 17.0000 g | PACK | Freq: Every day | ORAL | 0 refills | Status: DC

## 2016-04-12 NOTE — ED Provider Notes (Signed)
[facility] DEPT Provider Note   CSN: [id number] Arrival date & time: 04/12/16  1027   History   Chief Complaint Chief Complaint  Patient presents with  . Abdominal Pain  . Constipation    HPI Sydney [name] is a 82 y.o. female.  HPI   82 year old female presents today with complaints of constipation and rectal pain. Patient reports 2 days ago she had a bowel movement it was very small and firm. She notes usually her stools are somewhat loose. She has not had a bowel movement since, continues to pass gas, denies any abdominal pain nausea or vomiting. She reports that when she tries to have a bowel movement she feels pressure and pain down in rectum as if something is impacted down there. She denies any history of the same, reports that she recently has been taking antibiotics for upper respiratory infection, with steroids. Patient denies any bleeding, diarrhea, or any other concerning signs or symptoms today. Patient does note a past surgical history of fistula.  Past Medical History:  Diagnosis Date  . Arthritis    "left hand" (02/17/2013)  . Glaucoma   . High cholesterol    "on RX years ago" (02/17/2013)  . Hypertension   . Meningioma ([place]) 05/01/2015  . Osteoporosis 02/2014   T score -2.5  followed by Dr. Lysle Rubens  . Palpitations    PACs, PVCs and short runs of atrial tachycardia on Holter monitoring  . TIA (transient ischemic attack) 1990's    Patient Active Problem List   Diagnosis Date Noted  . Meningioma ([place]) 05/01/2015  . 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 Diplopia 04/08/2015  . Leukopenia 04/08/2015  . Bradycardia 02/17/2013  . Occlusion and stenosis of carotid artery without mention of cerebral infarction 02/17/2013    Past Surgical History:  Procedure Laterality Date  . ANAL FISTULECTOMY  1970  . TONSILLECTOMY      OB History    Gravida Para Term Preterm AB Living   1 1 1     1    SAB TAB Ectopic Multiple Live Births                   Home  Medications    Prior to Admission medications   Medication Sig Start Date End Date Taking? Authorizing Provider  aspirin (ECOTRIN LOW STRENGTH) 81 MG EC tablet Take 81 mg by mouth daily. Swallow whole.    Historical Provider, MD  cholecalciferol (VITAMIN D) 1000 UNITS tablet Take 1,000 Units by mouth daily.     Historical Provider, MD  clopidogrel (PLAVIX) 75 MG tablet Take 75 mg by mouth daily.    Historical Provider, MD  doxycycline (VIBRAMYCIN) 100 MG capsule Take 1 capsule (100 mg total) by mouth 2 (two) times daily. 04/02/16   [name], MD  fluticasone (FLONASE) 50 MCG/ACT nasal spray Place 2 sprays into both nostrils daily. 01/11/14   Linton Flemings, MD  folic acid (FOLVITE) 1 MG tablet Take 1 mg by mouth daily.    Historical Provider, MD  ibandronate (BONIVA) 150 MG tablet Take 1 tablet by mouth every 30 (thirty) days. 03/20/14   Historical Provider, MD  ketoconazole (NIZORAL) 2 % shampoo Apply 1 application topically daily as needed for irritation.  12/19/13   Historical Provider, MD  latanoprost (XALATAN) 0.005 % ophthalmic solution 1 drop at bedtime.    Historical Provider, MD  loratadine (CLARITIN) 10 MG tablet Take 10 mg by mouth daily as needed for allergies (for seasonal allergies).  Historical Provider, MD  metoprolol (LOPRESSOR) 50 MG tablet Take 25 mg by mouth 2 (two) times daily. 1/2 TAB PO BID 03/14/15   Historical Provider, MD  polyethylene glycol (MIRALAX) packet Take 17 g by mouth daily. 04/12/16   [name], PA-C  traMADol (ULTRAM) 50 MG tablet Take 50 mg by mouth daily as needed. 04/04/15   Historical Provider, MD    Family History Family History  Problem Relation Age of Onset  . Hypertension Father   . Heart attack Father   . Stroke Brother   . Stroke Maternal Grandmother     Social History Social History  Substance Use Topics  . Smoking status: Never Smoker  . Smokeless tobacco: Never Used  . Alcohol use No     Allergies   Demerol [meperidine];  Codeine; Sulfa antibiotics; and Tomato   Review of Systems Review of Systems  All other systems reviewed and are negative.    Physical Exam Updated Vital Signs BP 165/70   Pulse (!) 58   Temp 98.5 F (36.9 C) (Oral)   SpO2 100%   Physical Exam  Constitutional: She is oriented to person, place, and time. She appears well-developed and well-nourished.  HENT:  Head: Normocephalic and atraumatic.  Eyes: Conjunctivae are normal. Pupils are equal, round, and reactive to light. Right eye exhibits no discharge. Left eye exhibits no discharge. No scleral icterus.  Neck: Normal range of motion. No JVD present. No tracheal deviation present.  Pulmonary/Chest: Effort normal. No stridor.  Abdominal: Soft. Bowel sounds are normal. She exhibits no distension and no mass. There is no tenderness. There is no rebound and no guarding. No hernia.  Genitourinary:  Genitourinary Comments: Stool in rectal vault- no bleeding - no significant impaction  Neurological: She is alert and oriented to person, place, and time. Coordination normal.  Psychiatric: She has a normal mood and affect. Her behavior is normal. Judgment and thought content normal.  Nursing note and vitals reviewed.    ED Treatments / Results  Labs (all labs ordered are listed, but only abnormal results are displayed) Labs Reviewed - No data to display  EKG  EKG Interpretation None       Radiology No results found.  Procedures Procedures (including critical care time)  Medications Ordered in ED Medications - No data to display   Initial Impression / Assessment and Plan / ED Course  I have reviewed the triage vital signs and the nursing notes.  Pertinent labs & imaging results that were available during my care of the patient were reviewed by me and considered in my medical decision making (see chart for details).  Clinical Course       Final Clinical Impressions(s) / ED Diagnoses   Final diagnoses:    Constipation, unspecified constipation type    Labs:  Imaging:  Consults:  Therapeutics:  Discharge Meds:   Assessment/Plan: 82 year old female presents today with complaints of constipation. Patient has not had a bowel movement in one day, reports that she had pain with attempts at defecating. She reports this is in her rectum, she denies any abdominal pain. Patient denies any nausea vomiting, fever or chills. She denies any bleeding or diarrhea. Patient notes recent antibiotic use, and has not been drinking as much water as she needs to. I attempted to manually disimpact the patient, she had significant stool burning in the rectal vault, but no significant hard impaction. Patient was unable to pass stool after the disimpaction, but she reported that improved her rectal  pain. I discussed enema versus MiraLAX at home, with the patient and her daughter agreed that a trial of MiraLAX over the next couple of days with primary care follow-up would be appropriate. Again patient has no abdominal pain I have no concern for intra-abdominal pathology at this time as she has not had a bowel movement and only one day. She verbalized her understanding and agreement today's plan, she assured that she will return if any new or worsening signs or symptoms presented.       New Prescriptions Discharge Medication List as of 04/12/2016  1:44 PM    START taking these medications   Details  polyethylene glycol (MIRALAX) packet Take 17 g by mouth daily., Starting Sun 04/12/2016, Print         [name], PA-C 04/12/16 2033    Malvin Johns, MD 04/13/16 2052

## 2016-04-12 NOTE — Discharge Instructions (Signed)
Please read attached information. If you experience any new or worsening signs or symptoms please return to the emergency room for evaluation. Please follow-up with your primary care provider or specialist as discussed. Please use medication prescribed only as directed and discontinue taking if you have any concerning signs or symptoms.   °

## 2016-04-12 NOTE — ED Triage Notes (Signed)
Pt sts abd pain and constipation with no BM x 3 days

## 2016-05-14 ENCOUNTER — Encounter (HOSPITAL_COMMUNITY): Payer: Self-pay | Admitting: Emergency Medicine

## 2016-05-14 ENCOUNTER — Emergency Department (HOSPITAL_COMMUNITY): Payer: Medicare Other

## 2016-05-14 ENCOUNTER — Inpatient Hospital Stay (HOSPITAL_COMMUNITY)
Admission: EM | Admit: 2016-05-14 | Discharge: 2016-05-16 | DRG: 372 | Disposition: A | Discharge status: Home / Self Care | Payer: Medicare Other | Attending: Internal Medicine | Admitting: Internal Medicine

## 2016-05-14 DIAGNOSIS — Z8249 Family history of ischemic heart disease and other diseases of the circulatory system: Secondary | ICD-10-CM | POA: Diagnosis not present

## 2016-05-14 DIAGNOSIS — E876 Hypokalemia: Secondary | ICD-10-CM | POA: Diagnosis present

## 2016-05-14 DIAGNOSIS — I10 Essential (primary) hypertension: Secondary | ICD-10-CM | POA: Diagnosis not present

## 2016-05-14 DIAGNOSIS — M810 Age-related osteoporosis without current pathological fracture: Secondary | ICD-10-CM | POA: Diagnosis present

## 2016-05-14 DIAGNOSIS — D649 Anemia, unspecified: Secondary | ICD-10-CM | POA: Diagnosis not present

## 2016-05-14 DIAGNOSIS — Z8673 Personal history of transient ischemic attack (TIA), and cerebral infarction without residual deficits: Secondary | ICD-10-CM | POA: Diagnosis not present

## 2016-05-14 DIAGNOSIS — R197 Diarrhea, unspecified: Secondary | ICD-10-CM

## 2016-05-14 DIAGNOSIS — H409 Unspecified glaucoma: Secondary | ICD-10-CM | POA: Diagnosis present

## 2016-05-14 DIAGNOSIS — Z79899 Other long term (current) drug therapy: Secondary | ICD-10-CM

## 2016-05-14 DIAGNOSIS — E785 Hyperlipidemia, unspecified: Secondary | ICD-10-CM | POA: Diagnosis present

## 2016-05-14 DIAGNOSIS — Z7951 Long term (current) use of inhaled steroids: Secondary | ICD-10-CM | POA: Diagnosis not present

## 2016-05-14 DIAGNOSIS — Z7982 Long term (current) use of aspirin: Secondary | ICD-10-CM | POA: Diagnosis not present

## 2016-05-14 DIAGNOSIS — A0472 Enterocolitis due to Clostridium difficile, not specified as recurrent: Secondary | ICD-10-CM | POA: Diagnosis not present

## 2016-05-14 DIAGNOSIS — E7800 Pure hypercholesterolemia, unspecified: Secondary | ICD-10-CM | POA: Diagnosis not present

## 2016-05-14 DIAGNOSIS — E860 Dehydration: Secondary | ICD-10-CM | POA: Diagnosis not present

## 2016-05-14 DIAGNOSIS — R531 Weakness: Secondary | ICD-10-CM

## 2016-05-14 DIAGNOSIS — K529 Noninfective gastroenteritis and colitis, unspecified: Secondary | ICD-10-CM | POA: Diagnosis present

## 2016-05-14 DIAGNOSIS — E871 Hypo-osmolality and hyponatremia: Secondary | ICD-10-CM | POA: Diagnosis present

## 2016-05-14 DIAGNOSIS — Z7902 Long term (current) use of antithrombotics/antiplatelets: Secondary | ICD-10-CM | POA: Diagnosis not present

## 2016-05-14 DIAGNOSIS — Z823 Family history of stroke: Secondary | ICD-10-CM | POA: Diagnosis not present

## 2016-05-14 DIAGNOSIS — Z86011 Personal history of benign neoplasm of the brain: Secondary | ICD-10-CM | POA: Diagnosis not present

## 2016-05-14 LAB — COMPREHENSIVE METABOLIC PANEL
ALK PHOS: 65 U/L (ref 38–126)
ALT: 16 U/L (ref 14–54)
AST: 27 U/L (ref 15–41)
Albumin: 3.2 g/dL — ABNORMAL LOW (ref 3.5–5.0)
Anion gap: 9 (ref 5–15)
BILIRUBIN TOTAL: 0.4 mg/dL (ref 0.3–1.2)
BUN: 8 mg/dL (ref 6–20)
CALCIUM: 9 mg/dL (ref 8.9–10.3)
CO2: 18 mmol/L — ABNORMAL LOW (ref 22–32)
CREATININE: 0.75 mg/dL (ref 0.44–1.00)
Chloride: 106 mmol/L (ref 101–111)
GFR calc Af Amer: >60 mL/min (ref >60)
Glucose, Bld: 100 mg/dL — ABNORMAL HIGH (ref 65–99)
Potassium: 2.9 mmol/L — ABNORMAL LOW (ref 3.5–5.1)
Sodium: 133 mmol/L — ABNORMAL LOW (ref 135–145)
TOTAL PROTEIN: 6.6 g/dL (ref 6.5–8.1)

## 2016-05-14 LAB — CBC
HCT: 33.3 % — ABNORMAL LOW (ref 36.0–46.0)
Hemoglobin: 11.2 g/dL — ABNORMAL LOW (ref 12.0–15.0)
MCH: 29.5 pg (ref 26.0–34.0)
MCHC: 33.6 g/dL (ref 30.0–36.0)
MCV: 87.6 fL (ref 78.0–100.0)
PLATELETS: 317 10*3/uL (ref 150–400)
RBC: 3.8 MIL/uL — ABNORMAL LOW (ref 3.87–5.11)
RDW: 14 % (ref 11.5–15.5)
WBC: 6.1 10*3/uL (ref 4.0–10.5)

## 2016-05-14 LAB — URINALYSIS, ROUTINE W REFLEX MICROSCOPIC
Bacteria, UA: NONE SEEN
Bilirubin Urine: NEGATIVE
GLUCOSE, UA: NEGATIVE mg/dL
KETONES UR: 5 mg/dL — AB
LEUKOCYTES UA: NEGATIVE
Nitrite: NEGATIVE
PH: 6 (ref 5.0–8.0)
PROTEIN: NEGATIVE mg/dL
Specific Gravity, Urine: 1.003 — ABNORMAL LOW (ref 1.005–1.030)

## 2016-05-14 LAB — LIPASE, BLOOD: Lipase: 31 U/L (ref 11–51)

## 2016-05-14 MED ORDER — FOLIC ACID 1 MG PO TABS
1.0000 mg | ORAL_TABLET | Freq: Every day | ORAL | Status: DC
  Administered 2016-05-15 – 2016-05-16 (×2): 1 mg via ORAL
  Filled 2016-05-14 (×2): qty 1

## 2016-05-14 MED ORDER — POTASSIUM CHLORIDE IN NACL 40-0.9 MEQ/L-% IV SOLN
INTRAVENOUS | Status: DC
  Administered 2016-05-15 – 2016-05-16 (×4): 100 mL/h via INTRAVENOUS
  Filled 2016-05-14 (×7): qty 1000

## 2016-05-14 MED ORDER — VITAMIN D 1000 UNITS PO TABS
1000.0000 [IU] | ORAL_TABLET | Freq: Every day | ORAL | Status: DC
  Administered 2016-05-15 – 2016-05-16 (×2): 1000 [IU] via ORAL
  Filled 2016-05-14 (×2): qty 1

## 2016-05-14 MED ORDER — SODIUM CHLORIDE 0.9 % IV BOLUS (SEPSIS)
1000.0000 mL | Freq: Once | INTRAVENOUS | Status: AC
  Administered 2016-05-14: 1000 mL via INTRAVENOUS

## 2016-05-14 MED ORDER — ASPIRIN EC 81 MG PO TBEC
81.0000 mg | DELAYED_RELEASE_TABLET | Freq: Every day | ORAL | Status: DC
Start: 2016-05-15 — End: 2016-05-16
  Administered 2016-05-15 – 2016-05-16 (×2): 81 mg via ORAL
  Filled 2016-05-14 (×2): qty 1

## 2016-05-14 MED ORDER — ONDANSETRON HCL 4 MG/2ML IJ SOLN: 4.0000 mg | Freq: Four times a day (QID) | INTRAMUSCULAR | Status: DC | PRN

## 2016-05-14 MED ORDER — LORATADINE 10 MG PO TABS: 10.0000 mg | ORAL_TABLET | Freq: Every day | ORAL | Status: DC | PRN

## 2016-05-14 MED ORDER — ONDANSETRON HCL 4 MG PO TABS
4.0000 mg | ORAL_TABLET | Freq: Four times a day (QID) | ORAL | Status: DC | PRN
Start: 2016-05-14 — End: 2016-05-16

## 2016-05-14 MED ORDER — CLOPIDOGREL BISULFATE 75 MG PO TABS
75.0000 mg | ORAL_TABLET | Freq: Every day | ORAL | Status: DC
  Administered 2016-05-15 – 2016-05-16 (×2): 75 mg via ORAL
  Filled 2016-05-14 (×2): qty 1

## 2016-05-14 MED ORDER — LATANOPROST 0.005 % OP SOLN
1.0000 [drp] | Freq: Every day | OPHTHALMIC | Status: DC
  Administered 2016-05-15 (×2): 1 [drp] via OPHTHALMIC
  Filled 2016-05-14: qty 2.5

## 2016-05-14 MED ORDER — METOPROLOL TARTRATE 25 MG PO TABS
25.0000 mg | ORAL_TABLET | Freq: Two times a day (BID) | ORAL | Status: DC
  Administered 2016-05-15 – 2016-05-16 (×3): 25 mg via ORAL
  Filled 2016-05-14 (×3): qty 1

## 2016-05-14 MED ORDER — IOPAMIDOL (ISOVUE-300) INJECTION 61%
INTRAVENOUS | Status: AC
  Administered 2016-05-14: 100 mL
  Filled 2016-05-14: qty 100

## 2016-05-14 MED ORDER — TRAMADOL HCL 50 MG PO TABS: 50.0000 mg | ORAL_TABLET | Freq: Every day | ORAL | Status: DC | PRN

## 2016-05-14 MED ORDER — POTASSIUM CHLORIDE 20 MEQ/15ML (10%) PO SOLN
40.0000 meq | Freq: Once | ORAL | Status: AC
  Administered 2016-05-14: 40 meq via ORAL
  Filled 2016-05-14: qty 30

## 2016-05-14 MED ORDER — SODIUM CHLORIDE 0.9% FLUSH
3.0000 mL | Freq: Two times a day (BID) | INTRAVENOUS | Status: DC
  Administered 2016-05-15 (×2): 3 mL via INTRAVENOUS

## 2016-05-14 NOTE — ED Triage Notes (Signed)
Per EMS: pt sts diarrhea and abd cramping x 1 month

## 2016-05-14 NOTE — ED Notes (Signed)
Pt's family member came up to the [facility] desk inquiring about her mother. Stated patient had been waiting about 1.5 hours and was requesting how much longer the wait would be. This RN stated how many people were currently waiting for a room and tried to attempt to tell the family member how long the longest person had been waiting. This RN was cut off and stated, "I do not care about how long the longest person has been waiting. I want to know how long we will wait." Family reassured and tried to explain that we had some people up for discharge and have some bed assignments upstairs that would create rooms. Again, Pt's family requested how long, and this RN stated she would be unable to predict how long.

## 2016-05-14 NOTE — ED Notes (Signed)
Pt ambulatory w/ steady gait to restroom. 

## 2016-05-14 NOTE — ED Notes (Signed)
Patient transported to CT 

## 2016-05-14 NOTE — ED Provider Notes (Signed)
[facility] DEPT Provider Note  CSN: [id number] Arrival date & time: 05/14/16  1454  History   Chief Complaint Chief Complaint  Patient presents with  . Abdominal Pain  . Diarrhea   HPI Sydney ORTT is a 83 y.o. female.  The history is provided by the patient, medical records and a relative. No language interpreter was used.  Illness  This is a new problem. The current episode started more than 1 week ago. The problem occurs constantly. The problem has been gradually worsening. Associated symptoms include abdominal pain. Pertinent negatives include no chest pain, no headaches and no shortness of breath. The symptoms are aggravated by eating. Nothing relieves the symptoms.    Past Medical History:  Diagnosis Date  . Arthritis    "left hand" (02/17/2013)  . Glaucoma   . High cholesterol    "on RX years ago" (02/17/2013)  . Hypertension   . Meningioma ([place]) 05/01/2015  . Osteoporosis 02/2014   T score -2.5  followed by Dr. Lysle Rubens  . Palpitations    PACs, PVCs and short runs of atrial tachycardia on Holter monitoring  . TIA (transient ischemic attack) 1990's   Patient Active Problem List   Diagnosis Date Noted  . Hypokalemia 05/14/2016  . Anemia 05/14/2016  . Hyponatremia 05/14/2016  . Glaucoma 05/14/2016  . Enteritis 05/14/2016  . Meningioma ([place]) 05/01/2015  . 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 Diplopia 04/08/2015  . Leukopenia 04/08/2015  . Bradycardia 02/17/2013  . Occlusion and stenosis of carotid artery without mention of cerebral infarction 02/17/2013   Past Surgical History:  Procedure Laterality Date  . ANAL FISTULECTOMY  1970  . TONSILLECTOMY     OB History    Gravida Para Term Preterm AB Living   1 1 1     1    SAB TAB Ectopic Multiple Live Births                  Home Medications    Prior to Admission medications   Medication Sig Start Date End Date Taking? Authorizing Provider  aspirin (ECOTRIN LOW STRENGTH) 81 MG EC tablet Take 81 mg by  mouth daily. Swallow whole.   Yes Historical Provider, MD  cholecalciferol (VITAMIN D) 1000 UNITS tablet Take 1,000 Units by mouth daily.    Yes Historical Provider, MD  clopidogrel (PLAVIX) 75 MG tablet Take 75 mg by mouth daily.   Yes Historical Provider, MD  fluticasone (FLONASE) 50 MCG/ACT nasal spray Place 2 sprays into both nostrils daily. Patient taking differently: Place 2 sprays into both nostrils daily as needed for allergies or rhinitis.  01/11/14  Yes Linton Flemings, MD  folic acid (FOLVITE) 1 MG tablet Take 1 mg by mouth daily.   Yes Historical Provider, MD  ketoconazole (NIZORAL) 2 % shampoo Apply 1 application topically daily as needed for irritation.  12/19/13  Yes Historical Provider, MD  latanoprost (XALATAN) 0.005 % ophthalmic solution Place 1 drop into both eyes at bedtime.    Yes Historical Provider, MD  loratadine (CLARITIN) 10 MG tablet Take 10 mg by mouth daily as needed for allergies (for seasonal allergies).   Yes Historical Provider, MD  metoprolol (LOPRESSOR) 50 MG tablet Take 25 mg by mouth 2 (two) times daily.  03/14/15  Yes Historical Provider, MD  potassium chloride (K-DUR,KLOR-CON) 10 MEQ tablet Take 10 mEq by mouth daily. For 7 days 05/12/16  Yes Historical Provider, MD  Saccharomyces boulardii (FLORASTOR PO) Take 1 tablet by mouth 2 (two) times daily.  For 7 days   Yes Historical Provider, MD  traMADol (ULTRAM) 50 MG tablet Take 50 mg by mouth daily as needed (for pain).  04/04/15  Yes Historical Provider, MD  doxycycline (VIBRAMYCIN) 100 MG capsule Take 1 capsule (100 mg total) by mouth 2 (two) times daily. Patient not taking: Reported on 05/14/2016 04/02/16   Sydney Auerbach, MD  ibandronate (BONIVA) 150 MG tablet Take 1 tablet by mouth every 30 (thirty) days. 03/20/14   Historical Provider, MD  polyethylene glycol (MIRALAX) packet Take 17 g by mouth daily. Patient not taking: Reported on 05/14/2016 04/12/16   [name], PA-C   Family History Family History    Problem Relation Age of Onset  . Hypertension Father   . Heart attack Father   . Stroke Brother   . Stroke Maternal Grandmother    Social History Social History  Substance Use Topics  . Smoking status: Never Smoker  . Smokeless tobacco: Never Used  . Alcohol use No    Allergies   Demerol [meperidine]; Codeine; Sulfa antibiotics; and Tomato   Review of Systems Review of Systems  Constitutional: Positive for appetite change (decreased).  Respiratory: Negative for shortness of breath.   Cardiovascular: Negative for chest pain.  Gastrointestinal: Positive for abdominal pain, diarrhea (watery non-bloody) and nausea.  Neurological: Negative for headaches.  All other systems reviewed and are negative.   Physical Exam Updated Vital Signs BP 115/60 (BP Location: Right Arm)   Pulse 71   Temp 98.4 F (36.9 C) (Oral)   Resp 17   Ht 5\' 2"  (1.575 m)   Wt 44.6 kg   SpO2 100%   BMI 18.00 kg/m   Physical Exam  Constitutional: She is oriented to person, place, and time. No distress.  Thin elderly African-American female  HENT:  Head: Normocephalic and atraumatic.  Dry MM  Eyes: EOM are normal. Pupils are equal, round, and reactive to light.  Neck: Normal range of motion. Neck supple.  Cardiovascular: Normal rate, regular rhythm and normal heart sounds.   Pulmonary/Chest: Effort normal and breath sounds normal.  Abdominal: Soft. Bowel sounds are normal. She exhibits no distension. There is tenderness (left abdomen).  Musculoskeletal: Normal range of motion.  Neurological: She is alert and oriented to person, place, and time.  Skin: Skin is dry. Capillary refill takes 2 to 3 seconds. She is not diaphoretic. There is pallor.  Nursing note and vitals reviewed.   ED Treatments / Results  Labs (all labs ordered are listed, but only abnormal results are displayed) Labs Reviewed  C DIFFICILE QUICK SCREEN W PCR REFLEX - Abnormal; Notable for the following:       Result Value    C Diff antigen POSITIVE (*)    C Diff toxin POSITIVE (*)    All other components within normal limits  COMPREHENSIVE METABOLIC PANEL - Abnormal; Notable for the following:    Sodium 133 (*)    Potassium 2.9 (*)    CO2 18 (*)    Glucose, Bld 100 (*)    Albumin 3.2 (*)    All other components within normal limits  CBC - Abnormal; Notable for the following:    RBC 3.80 (*)    Hemoglobin 11.2 (*)    HCT 33.3 (*)    All other components within normal limits  URINALYSIS, ROUTINE W REFLEX MICROSCOPIC - Abnormal; Notable for the following:    Color, Urine STRAW (*)    Specific Gravity, Urine 1.003 (*)    Hgb  urine dipstick SMALL (*)    Ketones, ur 5 (*)    Squamous Epithelial / LPF 0-5 (*)    All other components within normal limits  GASTROINTESTINAL PANEL BY PCR, STOOL (REPLACES STOOL CULTURE)  LIPASE, BLOOD  CBC WITH DIFFERENTIAL/PLATELET  BASIC METABOLIC PANEL  MAGNESIUM   EKG  EKG Interpretation None      Radiology Ct Abdomen Pelvis W Contrast  Result Date: 05/14/2016 CLINICAL DATA:  Left-sided abdominal pain. Diarrhea after 2 rounds of antibiotics. Concern for diverticulitis. EXAM: CT ABDOMEN AND PELVIS WITH CONTRAST TECHNIQUE: Multidetector CT imaging of the abdomen and pelvis was performed using the standard protocol following bolus administration of intravenous contrast. CONTRAST:  100 cc Isovue-300 IV COMPARISON:  CT 06/21/2012 FINDINGS: Lower chest: Scarring in both lung bases. No pleural fluid or consolidation. Hepatobiliary: Unchanged cyst in the pericaval right lobe of the liver. No new or suspicious hepatic lesion. Gallbladder physiologically distended, no calcified stone. No biliary dilatation. Pancreas: No ductal dilatation or inflammation. Spleen: Normal in size without focal abnormality. Adrenals/Urinary Tract: No adrenal nodule. No hydronephrosis or perinephric edema. Symmetric renal enhancement and excretion on delayed phase imaging. Urinary bladder is minimally  distended without wall thickening. Stomach/Bowel: Fluid/ingested material within the stomach, no gastric wall thickening. The entire colon is fluid-filled with mild wall thickening and colonic wall enhancement. Sigmoid colon is tortuous. Pelvic small bowel loops are fluid-filled, similar to prior exam. No evidence of bowel obstruction. No significant diverticular disease. No periappendiceal inflammation. Vascular/Lymphatic: Mild abdominal atherosclerosis. No aneurysm. Prominent adnexal vascularity bilaterally with dilatation and tortuosity of the left ovarian vein. No adenopathy. Reproductive: Calcified uterine fibroids. Prominent periuterine and adnexal vascularity, left greater than right. There is dilatation of the left ovarian vein. Other: Small amount of free fluid in the pelvis is unchanged from prior exam and appears chronic. No increasing ascites. No free air. Trace fluid in the inguinal canals. Tiny fat containing umbilical hernia. Musculoskeletal: There are no acute or suspicious osseous abnormalities. Degenerative change in the spine with vacuum phenomenon. IMPRESSION: 1. Fluid-filled colon pelvic bowel loops with mild wall enhancement on wall thickening, suggesting enterocolitis. No evidence of acute diverticulitis. 2. Prominent bilateral adnexal vascularity. Dilatation and tortuosity of the left ovarian vein. Findings suggest pelvic congestion syndrome. 3. Mild for age intra-abdominal atherosclerosis. Electronically Signed   By: Jeb Levering M.D.   On: 05/14/2016 21:28    Procedures Procedures (including critical care time)  Medications Ordered in ED Medications  sodium chloride flush (NS) 0.9 % injection 3 mL (3 mLs Intravenous Given 05/15/16 0031)  ondansetron (ZOFRAN) tablet 4 mg (not administered)    Or  ondansetron (ZOFRAN) injection 4 mg (not administered)  0.9 % NaCl with KCl 40 mEq / L  infusion (100 mL/hr Intravenous New Bag/Given 05/15/16 0043)  latanoprost (XALATAN) 0.005 %  ophthalmic solution 1 drop (1 drop Both Eyes Given 05/15/16 0030)  traMADol (ULTRAM) tablet 50 mg (not administered)  loratadine (CLARITIN) tablet 10 mg (not administered)  metoprolol tartrate (LOPRESSOR) tablet 25 mg (not administered)  aspirin EC tablet 81 mg (not administered)  clopidogrel (PLAVIX) tablet 75 mg (not administered)  folic acid (FOLVITE) tablet 1 mg (not administered)  cholecalciferol (VITAMIN D) tablet 1,000 Units (not administered)  sodium chloride 0.9 % bolus 1,000 mL (0 mLs Intravenous Stopped 05/14/16 2231)  potassium chloride 20 MEQ/15ML (10%) solution 40 mEq (40 mEq Oral Given 05/14/16 1953)  iopamidol (ISOVUE-300) 61 % injection (100 mLs  Contrast Given 05/14/16 2100)    Initial Impression /  Assessment and Plan / ED Course  I have reviewed the triage vital signs and the nursing notes.  83 y.o. female with above stated PMHx, HPI, and physical. Last month, patient treated with amoxicillin for pharyngitis then doxycycline for "ear infection". Since then patient has been experiencing watery nonbloody diarrhea. Now with generalized weakness, decreased appetite, and mild nausea. Patient denies fever, chills, cough, headache, body aches, urinary symptoms. No recent travel. Patient attempted Imodium outpatient without success. Patient seen by her PCP at which time she was placed on probiotics and potassium repletion by mouth. Patient still with weakness and abdominal pain and presented for this reason.  Labs notable for mild hyponatremia, mild acidosis with CO2 of 18, hypokalemia to 2.9, and normal creatinine. Repleted potassium. CBC without leukocytosis or acute anemia. Pt given IVF's.  Laboratory and imaging results were personally reviewed by myself and used in the medical decision making of this patient's treatment and disposition.  Pt admitted to medicine for further evaluation and management of dehydration & failure to thrive. Pt understands and agrees with the plan and has  no further questions or concerns.   Pt care discussed with and followed by my attending, Dr. Dollene Cleveland, MD Pager [phone]  Final Clinical Impressions(s) / ED Diagnoses   Final diagnoses:  Diarrhea in adult patient  Dehydration  Hyponatremia  Hypokalemia  Generalized weakness   New Prescriptions Current Discharge Medication List       Mayer Camel, MD 05/15/16 [place], MD 05/19/16 [phone]

## 2016-05-14 NOTE — H&P (Signed)
History and Physical    Sydney [name] [id number] DOB: [birth year] DOA: 05/14/2016  PCP: [name], MD   Patient coming from: Home.  Chief Complaint: Diarrhea.  HPI: Sydney [name] is a 83 y.o. female with medical history significant of arthritis, glaucoma, hyperlipidemia, hypertension, meningioma, palpitations, TIA who comes to the emergency department with complaints of several weeks of diarrhea, occasional nausea following 2 courses of oral antibiotics for upper respiratory infections. She denies fever, chills, abdominal pain, emesis, melena or hematochezia. She denies chest pain, palpitations, dyspnea, productive cough dysuria or hematuria.  ED Course: Patient received IV fluids and electrolyte replacement. C. difficile toxin and stool is still pending.  Review of Systems: As per HPI otherwise 10 point review of systems negative.    Past Medical History:  Diagnosis Date  . Arthritis    "left hand" (02/17/2013)  . Glaucoma   . High cholesterol    "on RX years ago" (02/17/2013)  . Hypertension   . Meningioma ([place]) 05/01/2015  . Osteoporosis 02/2014   T score -2.5  followed by Dr. Lysle Rubens  . Palpitations    PACs, PVCs and short runs of atrial tachycardia on Holter monitoring  . TIA (transient ischemic attack) 1990's    Past Surgical History:  Procedure Laterality Date  . ANAL FISTULECTOMY  1970  . TONSILLECTOMY       reports that she has never smoked. She has never used smokeless tobacco. She reports that she does not drink alcohol or use drugs.  Allergies  Allergen Reactions  . Demerol [Meperidine] Other (See Comments)    Excessive sweating, cramping  . Codeine Rash  . Sulfa Antibiotics Other (See Comments)    Reaction unknown  . Tomato Itching    Family History  Problem Relation Age of Onset  . Hypertension Father   . Heart attack Father   . Stroke Brother   . Stroke Maternal Grandmother     Prior to Admission medications   Medication Sig Start  Date End Date Taking? Authorizing Provider  aspirin (ECOTRIN LOW STRENGTH) 81 MG EC tablet Take 81 mg by mouth daily. Swallow whole.   Yes Historical Provider, MD  cholecalciferol (VITAMIN D) 1000 UNITS tablet Take 1,000 Units by mouth daily.    Yes Historical Provider, MD  clopidogrel (PLAVIX) 75 MG tablet Take 75 mg by mouth daily.   Yes Historical Provider, MD  fluticasone (FLONASE) 50 MCG/ACT nasal spray Place 2 sprays into both nostrils daily. Patient taking differently: Place 2 sprays into both nostrils daily as needed for allergies or rhinitis.  01/11/14  Yes Linton Flemings, MD  folic acid (FOLVITE) 1 MG tablet Take 1 mg by mouth daily.   Yes Historical Provider, MD  ketoconazole (NIZORAL) 2 % shampoo Apply 1 application topically daily as needed for irritation.  12/19/13  Yes Historical Provider, MD  latanoprost (XALATAN) 0.005 % ophthalmic solution Place 1 drop into both eyes at bedtime.    Yes Historical Provider, MD  loratadine (CLARITIN) 10 MG tablet Take 10 mg by mouth daily as needed for allergies (for seasonal allergies).   Yes Historical Provider, MD  metoprolol (LOPRESSOR) 50 MG tablet Take 25 mg by mouth 2 (two) times daily.  03/14/15  Yes Historical Provider, MD  potassium chloride (K-DUR,KLOR-CON) 10 MEQ tablet Take 10 mEq by mouth daily. For 7 days 05/12/16  Yes Historical Provider, MD  Saccharomyces boulardii (FLORASTOR PO) Take 1 tablet by mouth 2 (two) times daily. For 7 days   Yes Historical Provider, MD  traMADol (ULTRAM) 50 MG tablet Take 50 mg by mouth daily as needed (for pain).  04/04/15  Yes Historical Provider, MD  doxycycline (VIBRAMYCIN) 100 MG capsule Take 1 capsule (100 mg total) by mouth 2 (two) times daily. Patient not taking: Reported on 05/14/2016 04/02/16   Sydney Auerbach, MD  ibandronate (BONIVA) 150 MG tablet Take 1 tablet by mouth every 30 (thirty) days. 03/20/14   Historical Provider, MD  polyethylene glycol (MIRALAX) packet Take 17 g by mouth daily. Patient  not taking: Reported on 05/14/2016 04/12/16   [name], PA-C    Physical Exam:  Constitutional: NAD, calm, comfortable Vitals:   05/14/16 1930 05/14/16 2121 05/14/16 2200 05/14/16 2300  BP: 117/92 120/75 121/61 129/63  Pulse: 65 77 68 76  Resp: 16 16 16 16   Temp:  97.9 F (36.6 C)    TempSrc:  Oral    SpO2: 100% 100% 100% 100%  Weight:      Height:       Eyes: PERRL, lids and conjunctivae normal ENMT: Mucous membranes are mildly dry. Posterior pharynx clear of any exudate or lesions. Neck: Normal, supple, no masses, no thyromegaly Respiratory: Clear to auscultation bilaterally, no wheezing, no crackles. Normal respiratory effort. No accessory muscle use.  Cardiovascular: Regular rate and rhythm, no murmurs / rubs / gallops. No extremity edema. 2+ pedal pulses. No carotid bruits.  Abdomen: Soft, no tenderness, no masses palpated. No hepatosplenomegaly. Bowel sounds positive.  Musculoskeletal: No clubbing / cyanosis.Good ROM, no contractures. Normal muscle tone.  Skin: No rashes, lesions, ulcers. Neurologic: CN 2-12 grossly intact. Sensation intact, DTR normal. Strength 5/5 in all 4.  Psychiatric: Normal judgment and insight. Alert and oriented x 4. Normal mood.    Labs on Admission: I have personally reviewed following labs and imaging studies  CBC:  Recent Labs Lab 05/14/16 1502  WBC 6.1  HGB 11.2*  HCT 33.3*  MCV 87.6  PLT [id number]   Basic Metabolic Panel:  Recent Labs Lab 05/14/16 1502  NA 133*  K 2.9*  CL 106  CO2 18*  GLUCOSE 100*  BUN 8  CREATININE 0.75  CALCIUM 9.0   GFR: Estimated Creatinine Clearance: 37.9 mL/min (by C-G formula based on SCr of 0.75 mg/dL). Liver Function Tests:  Recent Labs Lab 05/14/16 1502  AST 27  ALT 16  ALKPHOS 65  BILITOT 0.4  PROT 6.6  ALBUMIN 3.2*    Recent Labs Lab 05/14/16 1502  LIPASE 31   No results for input(s): AMMONIA in the last 168 hours. Coagulation Profile: No results for input(s): INR, PROTIME  in the last 168 hours. Cardiac Enzymes: No results for input(s): CKTOTAL, CKMB, CKMBINDEX, TROPONINI in the last 168 hours. BNP (last 3 results) No results for input(s): PROBNP in the last 8760 hours. HbA1C: No results for input(s): HGBA1C in the last 72 hours. CBG: No results for input(s): GLUCAP in the last 168 hours. Lipid Profile: No results for input(s): CHOL, HDL, LDLCALC, TRIG, CHOLHDL, LDLDIRECT in the last 72 hours. Thyroid Function Tests: No results for input(s): TSH, T4TOTAL, FREET4, T3FREE, THYROIDAB in the last 72 hours. Anemia Panel: No results for input(s): VITAMINB12, FOLATE, FERRITIN, TIBC, IRON, RETICCTPCT in the last 72 hours. Urine analysis:    Component Value Date/Time   COLORURINE STRAW (A) 05/14/2016 1900   APPEARANCEUR CLEAR 05/14/2016 1900   LABSPEC 1.003 (L) 05/14/2016 1900   PHURINE 6.0 05/14/2016 1900   GLUCOSEU NEGATIVE 05/14/2016 1900   HGBUR SMALL (A) 05/14/2016 1900   BILIRUBINUR  NEGATIVE 05/14/2016 1900   KETONESUR 5 (A) 05/14/2016 1900   PROTEINUR NEGATIVE 05/14/2016 1900   UROBILINOGEN 0.2 03-17-2013 1048   NITRITE NEGATIVE 05/14/2016 1900   LEUKOCYTESUR NEGATIVE 05/14/2016 1900    Radiological Exams on Admission: Ct Abdomen Pelvis W Contrast  Result Date: 05/14/2016 CLINICAL DATA:  Left-sided abdominal pain. Diarrhea after 2 rounds of antibiotics. Concern for diverticulitis. EXAM: CT ABDOMEN AND PELVIS WITH CONTRAST TECHNIQUE: Multidetector CT imaging of the abdomen and pelvis was performed using the standard protocol following bolus administration of intravenous contrast. CONTRAST:  100 cc Isovue-300 IV COMPARISON:  CT 06/21/2012 FINDINGS: Lower chest: Scarring in both lung bases. No pleural fluid or consolidation. Hepatobiliary: Unchanged cyst in the pericaval right lobe of the liver. No new or suspicious hepatic lesion. Gallbladder physiologically distended, no calcified stone. No biliary dilatation. Pancreas: No ductal dilatation or  inflammation. Spleen: Normal in size without focal abnormality. Adrenals/Urinary Tract: No adrenal nodule. No hydronephrosis or perinephric edema. Symmetric renal enhancement and excretion on delayed phase imaging. Urinary bladder is minimally distended without wall thickening. Stomach/Bowel: Fluid/ingested material within the stomach, no gastric wall thickening. The entire colon is fluid-filled with mild wall thickening and colonic wall enhancement. Sigmoid colon is tortuous. Pelvic small bowel loops are fluid-filled, similar to prior exam. No evidence of bowel obstruction. No significant diverticular disease. No periappendiceal inflammation. Vascular/Lymphatic: Mild abdominal atherosclerosis. No aneurysm. Prominent adnexal vascularity bilaterally with dilatation and tortuosity of the left ovarian vein. No adenopathy. Reproductive: Calcified uterine fibroids. Prominent periuterine and adnexal vascularity, left greater than right. There is dilatation of the left ovarian vein. Other: Small amount of free fluid in the pelvis is unchanged from prior exam and appears chronic. No increasing ascites. No free air. Trace fluid in the inguinal canals. Tiny fat containing umbilical hernia. Musculoskeletal: There are no acute or suspicious osseous abnormalities. Degenerative change in the spine with vacuum phenomenon. IMPRESSION: 1. Fluid-filled colon pelvic bowel loops with mild wall enhancement on wall thickening, suggesting enterocolitis. No evidence of acute diverticulitis. 2. Prominent bilateral adnexal vascularity. Dilatation and tortuosity of the left ovarian vein. Findings suggest pelvic congestion syndrome. 3. Mild for age intra-abdominal atherosclerosis. Electronically Signed   By: Jeb Levering M.D.   On: 05/14/2016 21:28    Assessment/Plan Principal Problem:   Hypokalemia Admit to telemetry/observation Continue potassium replacement. Check magnesium level. Follow-up potassium level in the  morning.  Active Problems:   Enteritis Awaiting for C. difficile results. Will start oral vancomycin if positive.    Anemia Check anemia profile.    Hyponatremia Replacing. Follow-up sodium level in a.m.    Glaucoma Continue Xalatan drops.        DVT prophylaxis: SCDs. Code Status: Full code. Family Communication: Her daughter was present in the room. Disposition Plan: Admit for IVF and electrolyte replacement. Consults called:  Admission status: Observation/telemetry.   Reubin Milan MD [facility] Pager [phone].  If 7PM-7AM, please contact night-coverage [email or web address] Password [facility]  05/14/2016, 11:24 PM

## 2016-05-15 DIAGNOSIS — E871 Hypo-osmolality and hyponatremia: Secondary | ICD-10-CM | POA: Diagnosis not present

## 2016-05-15 DIAGNOSIS — M810 Age-related osteoporosis without current pathological fracture: Secondary | ICD-10-CM | POA: Diagnosis not present

## 2016-05-15 DIAGNOSIS — Z86011 Personal history of benign neoplasm of the brain: Secondary | ICD-10-CM | POA: Diagnosis not present

## 2016-05-15 DIAGNOSIS — D649 Anemia, unspecified: Secondary | ICD-10-CM | POA: Diagnosis not present

## 2016-05-15 DIAGNOSIS — E7800 Pure hypercholesterolemia, unspecified: Secondary | ICD-10-CM | POA: Diagnosis not present

## 2016-05-15 DIAGNOSIS — I10 Essential (primary) hypertension: Secondary | ICD-10-CM | POA: Diagnosis not present

## 2016-05-15 DIAGNOSIS — Z79899 Other long term (current) drug therapy: Secondary | ICD-10-CM | POA: Diagnosis not present

## 2016-05-15 DIAGNOSIS — E860 Dehydration: Secondary | ICD-10-CM | POA: Diagnosis not present

## 2016-05-15 DIAGNOSIS — E785 Hyperlipidemia, unspecified: Secondary | ICD-10-CM | POA: Diagnosis not present

## 2016-05-15 DIAGNOSIS — Z8673 Personal history of transient ischemic attack (TIA), and cerebral infarction without residual deficits: Secondary | ICD-10-CM | POA: Diagnosis not present

## 2016-05-15 DIAGNOSIS — Z823 Family history of stroke: Secondary | ICD-10-CM | POA: Diagnosis not present

## 2016-05-15 DIAGNOSIS — Z7902 Long term (current) use of antithrombotics/antiplatelets: Secondary | ICD-10-CM | POA: Diagnosis not present

## 2016-05-15 DIAGNOSIS — Z7951 Long term (current) use of inhaled steroids: Secondary | ICD-10-CM | POA: Diagnosis not present

## 2016-05-15 DIAGNOSIS — R197 Diarrhea, unspecified: Secondary | ICD-10-CM | POA: Diagnosis present

## 2016-05-15 DIAGNOSIS — H409 Unspecified glaucoma: Secondary | ICD-10-CM | POA: Diagnosis not present

## 2016-05-15 DIAGNOSIS — A0472 Enterocolitis due to Clostridium difficile, not specified as recurrent: Secondary | ICD-10-CM | POA: Diagnosis not present

## 2016-05-15 DIAGNOSIS — Z7982 Long term (current) use of aspirin: Secondary | ICD-10-CM | POA: Diagnosis not present

## 2016-05-15 DIAGNOSIS — Z8249 Family history of ischemic heart disease and other diseases of the circulatory system: Secondary | ICD-10-CM | POA: Diagnosis not present

## 2016-05-15 DIAGNOSIS — E876 Hypokalemia: Secondary | ICD-10-CM | POA: Diagnosis not present

## 2016-05-15 DIAGNOSIS — K529 Noninfective gastroenteritis and colitis, unspecified: Secondary | ICD-10-CM | POA: Diagnosis not present

## 2016-05-15 LAB — CBC WITH DIFFERENTIAL/PLATELET
Basophils Absolute: 0 10*3/uL (ref 0.0–0.1)
Basophils Relative: 0 %
EOS ABS: 0.1 10*3/uL (ref 0.0–0.7)
EOS PCT: 1 %
HEMATOCRIT: 29.2 % — AB (ref 36.0–46.0)
Hemoglobin: 10.1 g/dL — ABNORMAL LOW (ref 12.0–15.0)
LYMPHS PCT: 20 %
Lymphs Abs: 1 10*3/uL (ref 0.7–4.0)
MCH: 30.1 pg (ref 26.0–34.0)
MCHC: 34.6 g/dL (ref 30.0–36.0)
MCV: 86.9 fL (ref 78.0–100.0)
Monocytes Absolute: 0.5 10*3/uL (ref 0.1–1.0)
Monocytes Relative: 10 %
NEUTROS ABS: 3.5 10*3/uL (ref 1.7–7.7)
Neutrophils Relative %: 69 %
PLATELETS: 281 10*3/uL (ref 150–400)
RBC: 3.36 MIL/uL — ABNORMAL LOW (ref 3.87–5.11)
RDW: 14 % (ref 11.5–15.5)
WBC: 5.1 10*3/uL (ref 4.0–10.5)

## 2016-05-15 LAB — GASTROINTESTINAL PANEL BY PCR, STOOL (REPLACES STOOL CULTURE)

## 2016-05-15 LAB — BASIC METABOLIC PANEL
Anion gap: 8 (ref 5–15)
BUN: 5 mg/dL — ABNORMAL LOW (ref 6–20)
CALCIUM: 8.2 mg/dL — AB (ref 8.9–10.3)
CO2: 17 mmol/L — ABNORMAL LOW (ref 22–32)
CREATININE: 0.56 mg/dL (ref 0.44–1.00)
Chloride: 113 mmol/L — ABNORMAL HIGH (ref 101–111)
GFR calc non Af Amer: >60 mL/min (ref >60)
Glucose, Bld: 76 mg/dL (ref 65–99)
Potassium: 3.3 mmol/L — ABNORMAL LOW (ref 3.5–5.1)
SODIUM: 138 mmol/L (ref 135–145)

## 2016-05-15 LAB — C DIFFICILE QUICK SCREEN W PCR REFLEX
C DIFFICLE (CDIFF) ANTIGEN: POSITIVE — AB
C Diff interpretation: DETECTED
C Diff toxin: POSITIVE — AB

## 2016-05-15 LAB — MAGNESIUM: MAGNESIUM: 1.9 mg/dL (ref 1.7–2.4)

## 2016-05-15 MED ORDER — VANCOMYCIN 50 MG/ML ORAL SOLUTION
125.0000 mg | Freq: Three times a day (TID) | ORAL | Status: DC
  Administered 2016-05-15 – 2016-05-16 (×6): 125 mg via ORAL
  Filled 2016-05-15 (×8): qty 2.5

## 2016-05-15 NOTE — Progress Notes (Signed)
Pt arrived on the unit via bed. Daughter is by the bedside.  Pt is alert and oriented x4.  No complaints of pain.  IV to the left Ac. Skin assessment completed.  No skin issues.  Educated patient and family on valuable policy and how to reach the staff with the equipment in room.  Vital signs stable.  Will continue to monitor patient

## 2016-05-15 NOTE — Progress Notes (Signed)
Pt stool sample positive for C. Diff. Notified the on call L. [place]. Will continue to monitor and notify as needed

## 2016-05-15 NOTE — Care Management Note (Signed)
Case Management Note  Patient Details  Name: Sydney [name] MRN: [id number] Date of Birth: [birth year]  Subjective/Objective:  Presents with c/o weakness, nausea, diarrhea, medical history significant of arthritis, glaucoma, hyperlipidemia, hypertension, meningioma, palpitations, TIA . Recently  completed  2 courses of oral antibiotics for upper respiratory infections.  - found to be c.diff.  positive      Ashleyanne Lassiter (Daughter)     [phone]      PCP: [name]  Action/Plan: Return to home when medically stable. CM to f/u with disposition needs.  Expected Discharge Date:                  Expected Discharge Plan:  Home/Self Care  In-House Referral:     Discharge planning Services  CM Consult  Post Acute Care Choice:    Choice offered to:     DME Arranged:    DME Agency:     HH Arranged:    HH Agency:     Status of Service:  In process, will continue to follow  If discussed at Long Length of Stay Meetings, dates discussed:    Additional Comments:  Sharin Mons, RN 05/15/2016, 8:46 AM

## 2016-05-15 NOTE — Progress Notes (Signed)
Patient BP 103/50.  Notified NP L. [place] on call.

## 2016-05-15 NOTE — Progress Notes (Signed)
[facility] PROGRESS NOTE  Sydney [name] [id number] DOB: 1930/10/05 DOA: 05/14/2016 PCP: [name], MD  Assessment/Plan: 83 y.o. female with medical history significant of arthritis, glaucoma, hyperlipidemia, hypertension, meningioma, palpitations, TIA, gout who recently finished multiple courses of antibiotics for URI/sinusitis   presented to the emergency department multiple episodes of diarrhea, found to have c diff enteritis   C diff, diarrhea. Still having diarrhea overnight -cont oral vanc, iv fluids. Isolation. Monitor   Dehydration, hypokalemia due to diarrhea. Cont iv fluids, replace lytes. Advance diet as tolerated  HTN. Stable on BB   Code Status: full Family Communication: d/w patient, her family,. rn (indicate person spoken with, relationship, and if by phone, the number) Disposition Plan: home 24-48 hrs    Consultants:  none  Procedures:  none  Antibiotics:  Oral vanc (1/18>>) (indicate start date, and stop date if known)  HPI/Subjective: Alert, no distress. Had several diarrhea overnight   Objective: Vitals:   05/14/16 2348 05/15/16 0558  BP: 115/60 (!) 103/50  Pulse: 71 63  Resp: 17 18  Temp: 98.4 F (36.9 C) 98.8 F (37.1 C)    Intake/Output Summary (Last 24 hours) at 05/15/16 0905 Last data filed at 05/15/16 [id number]  Gross per 24 hour  Intake          1661.67 ml  Output                0 ml  Net          1661.67 ml   Filed Weights   05/14/16 1457 05/14/16 2348  Weight: 45.1 kg (99 lb 6.4 oz) 44.6 kg (98 lb 6.4 oz)    Exam:   General:  Comfortable   Cardiovascular: s1,s2 rrr  Respiratory: CTA BL  Abdomen:  Soft, mild tender, no rebound   Musculoskeletal: no leg edema    Data Reviewed: Basic Metabolic Panel:  Recent Labs Lab 05/14/16 1502 05/15/16 0441  NA 133* 138  K 2.9* 3.3*  CL 106 113*  CO2 18* 17*  GLUCOSE 100* 76  BUN 8 5*  CREATININE 0.75 0.56  CALCIUM 9.0 8.2*  MG  --  1.9   Liver Function  Tests:  Recent Labs Lab 05/14/16 1502  AST 27  ALT 16  ALKPHOS 65  BILITOT 0.4  PROT 6.6  ALBUMIN 3.2*    Recent Labs Lab 05/14/16 1502  LIPASE 31   No results for input(s): AMMONIA in the last 168 hours. CBC:  Recent Labs Lab 05/14/16 1502 05/15/16 0441  WBC 6.1 5.1  NEUTROABS  --  3.5  HGB 11.2* 10.1*  HCT 33.3* 29.2*  MCV 87.6 86.9  PLT 317 281   Cardiac Enzymes: No results for input(s): CKTOTAL, CKMB, CKMBINDEX, TROPONINI in the last 168 hours. BNP (last 3 results) No results for input(s): BNP in the last 8760 hours.  ProBNP (last 3 results) No results for input(s): PROBNP in the last 8760 hours.  CBG: No results for input(s): GLUCAP in the last 168 hours.  Recent Results (from the past 240 hour(s))  C difficile quick scan w PCR reflex     Status: Abnormal   Collection Time: 05/14/16  8:36 PM  Result Value Ref Range Status   C Diff antigen POSITIVE (A) NEGATIVE Final   C Diff toxin POSITIVE (A) NEGATIVE Final   C Diff interpretation Toxin producing C. difficile detected.  Final    Comment: CRITICAL RESULT CALLED TO, READ BACK BY AND VERIFIED WITH: R GININWA,RN @0014  05/15/16 MKELLY,MLT  Studies: Ct Abdomen Pelvis W Contrast  Result Date: 05/14/2016 CLINICAL DATA:  Left-sided abdominal pain. Diarrhea after 2 rounds of antibiotics. Concern for diverticulitis. EXAM: CT ABDOMEN AND PELVIS WITH CONTRAST TECHNIQUE: Multidetector CT imaging of the abdomen and pelvis was performed using the standard protocol following bolus administration of intravenous contrast. CONTRAST:  100 cc Isovue-300 IV COMPARISON:  CT 06/21/2012 FINDINGS: Lower chest: Scarring in both lung bases. No pleural fluid or consolidation. Hepatobiliary: Unchanged cyst in the pericaval right lobe of the liver. No new or suspicious hepatic lesion. Gallbladder physiologically distended, no calcified stone. No biliary dilatation. Pancreas: No ductal dilatation or inflammation. Spleen: Normal in  size without focal abnormality. Adrenals/Urinary Tract: No adrenal nodule. No hydronephrosis or perinephric edema. Symmetric renal enhancement and excretion on delayed phase imaging. Urinary bladder is minimally distended without wall thickening. Stomach/Bowel: Fluid/ingested material within the stomach, no gastric wall thickening. The entire colon is fluid-filled with mild wall thickening and colonic wall enhancement. Sigmoid colon is tortuous. Pelvic small bowel loops are fluid-filled, similar to prior exam. No evidence of bowel obstruction. No significant diverticular disease. No periappendiceal inflammation. Vascular/Lymphatic: Mild abdominal atherosclerosis. No aneurysm. Prominent adnexal vascularity bilaterally with dilatation and tortuosity of the left ovarian vein. No adenopathy. Reproductive: Calcified uterine fibroids. Prominent periuterine and adnexal vascularity, left greater than right. There is dilatation of the left ovarian vein. Other: Small amount of free fluid in the pelvis is unchanged from prior exam and appears chronic. No increasing ascites. No free air. Trace fluid in the inguinal canals. Tiny fat containing umbilical hernia. Musculoskeletal: There are no acute or suspicious osseous abnormalities. Degenerative change in the spine with vacuum phenomenon. IMPRESSION: 1. Fluid-filled colon pelvic bowel loops with mild wall enhancement on wall thickening, suggesting enterocolitis. No evidence of acute diverticulitis. 2. Prominent bilateral adnexal vascularity. Dilatation and tortuosity of the left ovarian vein. Findings suggest pelvic congestion syndrome. 3. Mild for age intra-abdominal atherosclerosis. Electronically Signed   By: Jeb Levering M.D.   On: 05/14/2016 21:28    Scheduled Meds: . aspirin EC  81 mg Oral Daily  . cholecalciferol  1,000 Units Oral Daily  . clopidogrel  75 mg Oral Daily  . folic acid  1 mg Oral Daily  . latanoprost  1 drop Both Eyes QHS  . metoprolol  25 mg  Oral BID  . sodium chloride flush  3 mL Intravenous Q12H  . vancomycin  125 mg Oral TID AC & HS   Continuous Infusions: . 0.9 % NaCl with KCl 40 mEq / L 100 mL/hr (05/15/16 0043)    Principal Problem:   Hypokalemia Active Problems:   Anemia   Hyponatremia   Glaucoma   Enteritis    Time spent: >35 minutes     Kinnie Feil  [facility] Pager [phone]. If 7PM-7AM, please contact night-coverage at [email or web address], password [facility] 05/15/2016, 9:05 AM  LOS: 0 days

## 2016-05-16 DIAGNOSIS — K529 Noninfective gastroenteritis and colitis, unspecified: Secondary | ICD-10-CM

## 2016-05-16 DIAGNOSIS — E876 Hypokalemia: Secondary | ICD-10-CM

## 2016-05-16 DIAGNOSIS — E871 Hypo-osmolality and hyponatremia: Secondary | ICD-10-CM

## 2016-05-16 LAB — BASIC METABOLIC PANEL
Anion gap: 4 — ABNORMAL LOW (ref 5–15)
CHLORIDE: 114 mmol/L — AB (ref 101–111)
CO2: 19 mmol/L — ABNORMAL LOW (ref 22–32)
Calcium: 8.4 mg/dL — ABNORMAL LOW (ref 8.9–10.3)
Creatinine, Ser: 0.6 mg/dL (ref 0.44–1.00)
Glucose, Bld: 85 mg/dL (ref 65–99)
POTASSIUM: 4.2 mmol/L (ref 3.5–5.1)
Sodium: 137 mmol/L (ref 135–145)

## 2016-05-16 MED ORDER — SACCHAROMYCES BOULARDII 250 MG PO CAPS
250.0000 mg | ORAL_CAPSULE | Freq: Two times a day (BID) | ORAL | Status: DC
  Administered 2016-05-16: 250 mg via ORAL
  Filled 2016-05-16: qty 1

## 2016-05-16 MED ORDER — VANCOMYCIN 50 MG/ML ORAL SOLUTION: 125.0000 mg | Freq: Three times a day (TID) | ORAL | Status: DC

## 2016-05-16 NOTE — Discharge Summary (Signed)
Physician Discharge Summary  Sydney [name] [id number] DOB: Sep 07, 1930 DOA: 05/14/2016  PCP: [name], MD  Admit date: 05/14/2016 Discharge date: 05/16/2016  Admitted From: Home Disposition:  Home  Recommendations for Outpatient Follow-up:  1. Follow up with PCP in 2-3 weeks  Patient discharged less than 2 midnights as patient improved faster than anticipated  Discharge Condition:Improved CODE STATUS:Full Diet recommendation: Soft, advance as tolerated   Brief/Interim Summary: 83 y.o.femalewith medical history significant of arthritis, glaucoma, hyperlipidemia, hypertension, meningioma, palpitations, TIA, gout who recently finished multiple courses of antibiotics for URI/sinusitis   presented to the emergency department multiple episodes of diarrhea, found to have c diff enteritis   C diff colitis.  - Presented with continued diarrhea - Patient was continued on PO vanc - Symptoms improved with no further significant diarrhea - Patient tolerated soft diet without difficulty - Plan to discharge and to complete total 14 days of PO vancomycin  Dehydration, hypokalemia due to diarrhea.  - Cont iv fluids, replaced lytes.  - Successfully advanced diet   HTN.  - Remained stable on BB  Discharge Diagnoses:  Principal Problem:   Hypokalemia Active Problems:   Anemia   Hyponatremia   Glaucoma   Enteritis    Discharge Instructions   Allergies as of 05/16/2016      Reactions   Demerol [meperidine] Other (See Comments)   Excessive sweating, cramping   Codeine Rash   Sulfa Antibiotics Other (See Comments)   Reaction unknown   Tomato Itching      Medication List    STOP taking these medications   doxycycline 100 MG capsule Commonly known as:  VIBRAMYCIN   polyethylene glycol packet Commonly known as:  MIRALAX   potassium chloride 10 MEQ tablet Commonly known as:  K-DUR,KLOR-CON     TAKE these medications   cholecalciferol 1000 units tablet Commonly  known as:  VITAMIN D Take 1,000 Units by mouth daily.   clopidogrel 75 MG tablet Commonly known as:  PLAVIX Take 75 mg by mouth daily.   ECOTRIN LOW STRENGTH 81 MG EC tablet Generic drug:  aspirin Take 81 mg by mouth daily. Swallow whole.   FLORASTOR PO Take 1 tablet by mouth 2 (two) times daily. For 7 days   fluticasone 50 MCG/ACT nasal spray Commonly known as:  FLONASE Place 2 sprays into both nostrils daily. What changed:  when to take this  reasons to take this   folic acid 1 MG tablet Commonly known as:  FOLVITE Take 1 mg by mouth daily.   ibandronate 150 MG tablet Commonly known as:  BONIVA Take 1 tablet by mouth every 30 (thirty) days.   ketoconazole 2 % shampoo Commonly known as:  NIZORAL Apply 1 application topically daily as needed for irritation.   latanoprost 0.005 % ophthalmic solution Commonly known as:  XALATAN Place 1 drop into both eyes at bedtime.   loratadine 10 MG tablet Commonly known as:  CLARITIN Take 10 mg by mouth daily as needed for allergies (for seasonal allergies).   metoprolol 50 MG tablet Commonly known as:  LOPRESSOR Take 25 mg by mouth 2 (two) times daily.   traMADol 50 MG tablet Commonly known as:  ULTRAM Take 50 mg by mouth daily as needed (for pain).   vancomycin 50 mg/mL oral solution Commonly known as:  VANCOCIN Take 2.5 mLs (125 mg total) by mouth 4 (four) times daily -  before meals and at bedtime.      Follow-up Information    [name], MD.  Schedule an appointment as soon as possible for a visit in 2 week(s).   Specialty:  Internal Medicine Contact information: [address] [place] [address] [phone]          Allergies  Allergen Reactions  . Demerol [Meperidine] Other (See Comments)    Excessive sweating, cramping  . Codeine Rash  . Sulfa Antibiotics Other (See Comments)    Reaction unknown  . Tomato Itching    Procedures/Studies: Ct Abdomen Pelvis W  Contrast  Result Date: 05/14/2016 CLINICAL DATA:  Left-sided abdominal pain. Diarrhea after 2 rounds of antibiotics. Concern for diverticulitis. EXAM: CT ABDOMEN AND PELVIS WITH CONTRAST TECHNIQUE: Multidetector CT imaging of the abdomen and pelvis was performed using the standard protocol following bolus administration of intravenous contrast. CONTRAST:  100 cc Isovue-300 IV COMPARISON:  CT 06/21/2012 FINDINGS: Lower chest: Scarring in both lung bases. No pleural fluid or consolidation. Hepatobiliary: Unchanged cyst in the pericaval right lobe of the liver. No new or suspicious hepatic lesion. Gallbladder physiologically distended, no calcified stone. No biliary dilatation. Pancreas: No ductal dilatation or inflammation. Spleen: Normal in size without focal abnormality. Adrenals/Urinary Tract: No adrenal nodule. No hydronephrosis or perinephric edema. Symmetric renal enhancement and excretion on delayed phase imaging. Urinary bladder is minimally distended without wall thickening. Stomach/Bowel: Fluid/ingested material within the stomach, no gastric wall thickening. The entire colon is fluid-filled with mild wall thickening and colonic wall enhancement. Sigmoid colon is tortuous. Pelvic small bowel loops are fluid-filled, similar to prior exam. No evidence of bowel obstruction. No significant diverticular disease. No periappendiceal inflammation. Vascular/Lymphatic: Mild abdominal atherosclerosis. No aneurysm. Prominent adnexal vascularity bilaterally with dilatation and tortuosity of the left ovarian vein. No adenopathy. Reproductive: Calcified uterine fibroids. Prominent periuterine and adnexal vascularity, left greater than right. There is dilatation of the left ovarian vein. Other: Small amount of free fluid in the pelvis is unchanged from prior exam and appears chronic. No increasing ascites. No free air. Trace fluid in the inguinal canals. Tiny fat containing umbilical hernia. Musculoskeletal: There are no  acute or suspicious osseous abnormalities. Degenerative change in the spine with vacuum phenomenon. IMPRESSION: 1. Fluid-filled colon pelvic bowel loops with mild wall enhancement on wall thickening, suggesting enterocolitis. No evidence of acute diverticulitis. 2. Prominent bilateral adnexal vascularity. Dilatation and tortuosity of the left ovarian vein. Findings suggest pelvic congestion syndrome. 3. Mild for age intra-abdominal atherosclerosis. Electronically Signed   By: Jeb Levering M.D.   On: 05/14/2016 21:28     Subjective: Feels much better today. Tolerating diet  Discharge Exam: Vitals:   05/15/16 1303 05/15/16 2030  BP: 114/79 (!) 121/59  Pulse: 73 76  Resp: 20 18  Temp: 98.1 F (36.7 C) 99 F (37.2 C)   Vitals:   05/14/16 2348 05/15/16 0558 05/15/16 1303 05/15/16 2030  BP: 115/60 (!) 103/50 114/79 (!) 121/59  Pulse: 71 63 73 76  Resp: 17 18 20 18   Temp: 98.4 F (36.9 C) 98.8 F (37.1 C) 98.1 F (36.7 C) 99 F (37.2 C)  TempSrc: Oral Oral Oral Oral  SpO2: 100% 96%  100%  Weight: 44.6 kg (98 lb 6.4 oz)     Height: 5\' 2"  (1.575 m)       General: Pt is alert, awake, not in acute distress Cardiovascular: RRR, S1/S2 +, no rubs, no gallops Respiratory: CTA bilaterally, no wheezing, no rhonchi Abdominal: Soft, NT, ND, bowel sounds + Extremities: no edema, no cyanosis   The results of significant diagnostics from this  hospitalization (including imaging, microbiology, ancillary and laboratory) are listed below for reference.     Microbiology: Recent Results (from the past 240 hour(s))  Gastrointestinal Panel by PCR , Stool     Status: None   Collection Time: 05/14/16  8:36 PM  Result Value Ref Range Status   Campylobacter species NOT DETECTED NOT DETECTED Final   Plesimonas shigelloides NOT DETECTED NOT DETECTED Final   Salmonella species NOT DETECTED NOT DETECTED Final   Yersinia enterocolitica NOT DETECTED NOT DETECTED Final   Vibrio species NOT DETECTED  NOT DETECTED Final   Vibrio cholerae NOT DETECTED NOT DETECTED Final   Enteroaggregative E coli (EAEC) NOT DETECTED NOT DETECTED Final   Enteropathogenic E coli (EPEC) NOT DETECTED NOT DETECTED Final   Enterotoxigenic E coli (ETEC) NOT DETECTED NOT DETECTED Final   Shiga like toxin producing E coli (STEC) NOT DETECTED NOT DETECTED Final   Shigella/Enteroinvasive E coli (EIEC) NOT DETECTED NOT DETECTED Final   Cryptosporidium NOT DETECTED NOT DETECTED Final   Cyclospora cayetanensis NOT DETECTED NOT DETECTED Final   Entamoeba histolytica NOT DETECTED NOT DETECTED Final   Giardia lamblia NOT DETECTED NOT DETECTED Final   Adenovirus F40/41 NOT DETECTED NOT DETECTED Final   Astrovirus NOT DETECTED NOT DETECTED Final   Norovirus GI/GII NOT DETECTED NOT DETECTED Final   Rotavirus A NOT DETECTED NOT DETECTED Final   Sapovirus (I, II, IV, and V) NOT DETECTED NOT DETECTED Final  C difficile quick scan w PCR reflex     Status: Abnormal   Collection Time: 05/14/16  8:36 PM  Result Value Ref Range Status   C Diff antigen POSITIVE (A) NEGATIVE Final   C Diff toxin POSITIVE (A) NEGATIVE Final   C Diff interpretation Toxin producing C. difficile detected.  Final    Comment: CRITICAL RESULT CALLED TO, READ BACK BY AND VERIFIED WITH: R GININWA,RN @0014  05/15/16 MKELLY,MLT      Labs: BNP (last 3 results) No results for input(s): BNP in the last 8760 hours. Basic Metabolic Panel:  Recent Labs Lab 05/14/16 1502 05/15/16 0441 05/16/16 0500  NA 133* 138 137  K 2.9* 3.3* 4.2  CL 106 113* 114*  CO2 18* 17* 19*  GLUCOSE 100* 76 85  BUN 8 5* <5*  CREATININE 0.75 0.56 0.60  CALCIUM 9.0 8.2* 8.4*  MG  --  1.9  --    Liver Function Tests:  Recent Labs Lab 05/14/16 1502  AST 27  ALT 16  ALKPHOS 65  BILITOT 0.4  PROT 6.6  ALBUMIN 3.2*    Recent Labs Lab 05/14/16 1502  LIPASE 31   No results for input(s): AMMONIA in the last 168 hours. CBC:  Recent Labs Lab 05/14/16 1502  05/15/16 0441  WBC 6.1 5.1  NEUTROABS  --  3.5  HGB 11.2* 10.1*  HCT 33.3* 29.2*  MCV 87.6 86.9  PLT 317 281   Cardiac Enzymes: No results for input(s): CKTOTAL, CKMB, CKMBINDEX, TROPONINI in the last 168 hours. BNP: Invalid input(s): POCBNP CBG: No results for input(s): GLUCAP in the last 168 hours. D-Dimer No results for input(s): DDIMER in the last 72 hours. Hgb A1c No results for input(s): HGBA1C in the last 72 hours. Lipid Profile No results for input(s): CHOL, HDL, LDLCALC, TRIG, CHOLHDL, LDLDIRECT in the last 72 hours. Thyroid function studies No results for input(s): TSH, T4TOTAL, T3FREE, THYROIDAB in the last 72 hours.  Invalid input(s): FREET3 Anemia work up No results for input(s): VITAMINB12, FOLATE, FERRITIN, TIBC, IRON, RETICCTPCT in  the last 72 hours. Urinalysis    Component Value Date/Time   COLORURINE STRAW (A) 05/14/2016 1900   APPEARANCEUR CLEAR 05/14/2016 1900   LABSPEC 1.003 (L) 05/14/2016 1900   PHURINE 6.0 05/14/2016 1900   GLUCOSEU NEGATIVE 05/14/2016 1900   HGBUR SMALL (A) 05/14/2016 1900   BILIRUBINUR NEGATIVE 05/14/2016 1900   KETONESUR 5 (A) 05/14/2016 1900   PROTEINUR NEGATIVE 05/14/2016 1900   UROBILINOGEN 0.2 01-03-2013 1048   NITRITE NEGATIVE 05/14/2016 1900   LEUKOCYTESUR NEGATIVE 05/14/2016 1900   Sepsis Labs Invalid input(s): PROCALCITONIN,  WBC,  LACTICIDVEN Microbiology Recent Results (from the past 240 hour(s))  Gastrointestinal Panel by PCR , Stool     Status: None   Collection Time: 05/14/16  8:36 PM  Result Value Ref Range Status   Campylobacter species NOT DETECTED NOT DETECTED Final   Plesimonas shigelloides NOT DETECTED NOT DETECTED Final   Salmonella species NOT DETECTED NOT DETECTED Final   Yersinia enterocolitica NOT DETECTED NOT DETECTED Final   Vibrio species NOT DETECTED NOT DETECTED Final   Vibrio cholerae NOT DETECTED NOT DETECTED Final   Enteroaggregative E coli (EAEC) NOT DETECTED NOT DETECTED Final    Enteropathogenic E coli (EPEC) NOT DETECTED NOT DETECTED Final   Enterotoxigenic E coli (ETEC) NOT DETECTED NOT DETECTED Final   Shiga like toxin producing E coli (STEC) NOT DETECTED NOT DETECTED Final   Shigella/Enteroinvasive E coli (EIEC) NOT DETECTED NOT DETECTED Final   Cryptosporidium NOT DETECTED NOT DETECTED Final   Cyclospora cayetanensis NOT DETECTED NOT DETECTED Final   Entamoeba histolytica NOT DETECTED NOT DETECTED Final   Giardia lamblia NOT DETECTED NOT DETECTED Final   Adenovirus F40/41 NOT DETECTED NOT DETECTED Final   Astrovirus NOT DETECTED NOT DETECTED Final   Norovirus GI/GII NOT DETECTED NOT DETECTED Final   Rotavirus A NOT DETECTED NOT DETECTED Final   Sapovirus (I, II, IV, and V) NOT DETECTED NOT DETECTED Final  C difficile quick scan w PCR reflex     Status: Abnormal   Collection Time: 05/14/16  8:36 PM  Result Value Ref Range Status   C Diff antigen POSITIVE (A) NEGATIVE Final   C Diff toxin POSITIVE (A) NEGATIVE Final   C Diff interpretation Toxin producing C. difficile detected.  Final    Comment: CRITICAL RESULT CALLED TO, READ BACK BY AND VERIFIED WITH: R GININWA,RN @0014  05/15/16 MKELLY,MLT      SIGNED:   Donne Hazel, MD  [facility] 05/16/2016, 9:20 AM  If 7PM-7AM, please contact night-coverage [email or web address] Password [email or web address]

## 2016-05-16 NOTE — Progress Notes (Signed)
NURSING PROGRESS NOTE  Sydney [name] [id number] Discharge Data: 05/16/2016 12:12 PM Attending Provider: Donne Hazel, MD [id number], MD     Gay Filler to be D/C'd Home per MD order.  Discussed with the patient the After Visit Summary and all questions fully answered. All IV's discontinued with no bleeding noted. All belongings returned to patient for patient to take home.   Last Vital Signs:  Blood pressure (!) 121/59, pulse 76, temperature 99 F (37.2 C), temperature source Oral, resp. rate 18, height 5\' 2"  (1.575 m), weight 44.6 kg (98 lb 6.4 oz), SpO2 100 %.  Discharge Medication List Allergies as of 05/16/2016      Reactions   Demerol [meperidine] Other (See Comments)   Excessive sweating, cramping   Codeine Rash   Sulfa Antibiotics Other (See Comments)   Reaction unknown   Tomato Itching      Medication List    STOP taking these medications   doxycycline 100 MG capsule Commonly known as:  VIBRAMYCIN   polyethylene glycol packet Commonly known as:  MIRALAX   potassium chloride 10 MEQ tablet Commonly known as:  K-DUR,KLOR-CON     TAKE these medications   cholecalciferol 1000 units tablet Commonly known as:  VITAMIN D Take 1,000 Units by mouth daily.   clopidogrel 75 MG tablet Commonly known as:  PLAVIX Take 75 mg by mouth daily.   ECOTRIN LOW STRENGTH 81 MG EC tablet Generic drug:  aspirin Take 81 mg by mouth daily. Swallow whole.   FLORASTOR PO Take 1 tablet by mouth 2 (two) times daily. For 7 days   fluticasone 50 MCG/ACT nasal spray Commonly known as:  FLONASE Place 2 sprays into both nostrils daily. What changed:  when to take this  reasons to take this   folic acid 1 MG tablet Commonly known as:  FOLVITE Take 1 mg by mouth daily.   ibandronate 150 MG tablet Commonly known as:  BONIVA Take 1 tablet by mouth every 30 (thirty) days.   ketoconazole 2 % shampoo Commonly known as:  NIZORAL Apply 1 application topically daily as  needed for irritation.   latanoprost 0.005 % ophthalmic solution Commonly known as:  XALATAN Place 1 drop into both eyes at bedtime.   loratadine 10 MG tablet Commonly known as:  CLARITIN Take 10 mg by mouth daily as needed for allergies (for seasonal allergies).   metoprolol 50 MG tablet Commonly known as:  LOPRESSOR Take 25 mg by mouth 2 (two) times daily.   traMADol 50 MG tablet Commonly known as:  ULTRAM Take 50 mg by mouth daily as needed (for pain).   vancomycin 50 mg/mL oral solution Commonly known as:  VANCOCIN Take 2.5 mLs (125 mg total) by mouth 4 (four) times daily -  before meals and at bedtime.        Doristine Devoid, RN

## 2016-06-10 ENCOUNTER — Encounter (HOSPITAL_COMMUNITY): Payer: Self-pay

## 2016-06-10 ENCOUNTER — Emergency Department (HOSPITAL_COMMUNITY)
Admission: EM | Admit: 2016-06-10 | Discharge: 2016-06-10 | Disposition: A | Discharge status: Home / Self Care | Payer: Medicare Other | Attending: Emergency Medicine | Admitting: Emergency Medicine

## 2016-06-10 DIAGNOSIS — R197 Diarrhea, unspecified: Secondary | ICD-10-CM | POA: Insufficient documentation

## 2016-06-10 DIAGNOSIS — Z86011 Personal history of benign neoplasm of the brain: Secondary | ICD-10-CM | POA: Diagnosis not present

## 2016-06-10 DIAGNOSIS — E860 Dehydration: Secondary | ICD-10-CM | POA: Insufficient documentation

## 2016-06-10 DIAGNOSIS — R55 Syncope and collapse: Secondary | ICD-10-CM | POA: Diagnosis present

## 2016-06-10 DIAGNOSIS — Z79899 Other long term (current) drug therapy: Secondary | ICD-10-CM | POA: Diagnosis not present

## 2016-06-10 DIAGNOSIS — I951 Orthostatic hypotension: Secondary | ICD-10-CM | POA: Insufficient documentation

## 2016-06-10 DIAGNOSIS — Z7982 Long term (current) use of aspirin: Secondary | ICD-10-CM | POA: Insufficient documentation

## 2016-06-10 DIAGNOSIS — Z8673 Personal history of transient ischemic attack (TIA), and cerebral infarction without residual deficits: Secondary | ICD-10-CM | POA: Insufficient documentation

## 2016-06-10 LAB — URINALYSIS, ROUTINE W REFLEX MICROSCOPIC
Bilirubin Urine: NEGATIVE
GLUCOSE, UA: NEGATIVE mg/dL
KETONES UR: NEGATIVE mg/dL
Leukocytes, UA: NEGATIVE
Nitrite: NEGATIVE
PROTEIN: NEGATIVE mg/dL
Specific Gravity, Urine: 1.008 (ref 1.005–1.030)
pH: 5 (ref 5.0–8.0)

## 2016-06-10 LAB — I-STAT TROPONIN, ED: Troponin i, poc: 0 ng/mL (ref 0.00–0.08)

## 2016-06-10 LAB — HEPATIC FUNCTION PANEL
ALT: 14 U/L (ref 14–54)
AST: 23 U/L (ref 15–41)
Albumin: 3 g/dL — ABNORMAL LOW (ref 3.5–5.0)
Alkaline Phosphatase: 65 U/L (ref 38–126)
Total Bilirubin: 0.7 mg/dL (ref 0.3–1.2)
Total Protein: 6.1 g/dL — ABNORMAL LOW (ref 6.5–8.1)

## 2016-06-10 LAB — CBC
HEMATOCRIT: 31.1 % — AB (ref 36.0–46.0)
HEMOGLOBIN: 10.1 g/dL — AB (ref 12.0–15.0)
MCH: 29.8 pg (ref 26.0–34.0)
MCHC: 32.5 g/dL (ref 30.0–36.0)
MCV: 91.7 fL (ref 78.0–100.0)
Platelets: 256 10*3/uL (ref 150–400)
RBC: 3.39 MIL/uL — AB (ref 3.87–5.11)
RDW: 14.5 % (ref 11.5–15.5)
WBC: 6.4 10*3/uL (ref 4.0–10.5)

## 2016-06-10 LAB — BASIC METABOLIC PANEL
Anion gap: 8 (ref 5–15)
BUN: 10 mg/dL (ref 6–20)
CALCIUM: 8.7 mg/dL — AB (ref 8.9–10.3)
CHLORIDE: 107 mmol/L (ref 101–111)
CO2: 20 mmol/L — AB (ref 22–32)
Creatinine, Ser: 0.66 mg/dL (ref 0.44–1.00)
GFR calc Af Amer: >60 mL/min (ref >60)
GFR calc non Af Amer: >60 mL/min (ref >60)
GLUCOSE: 98 mg/dL (ref 65–99)
Potassium: 3.3 mmol/L — ABNORMAL LOW (ref 3.5–5.1)
Sodium: 135 mmol/L (ref 135–145)

## 2016-06-10 LAB — I-STAT CG4 LACTIC ACID, ED: LACTIC ACID, VENOUS: 0.76 mmol/L (ref 0.5–1.9)

## 2016-06-10 LAB — DIFFERENTIAL
BASOS PCT: 0 %
Basophils Absolute: 0 10*3/uL (ref 0.0–0.1)
EOS ABS: 0 10*3/uL (ref 0.0–0.7)
Eosinophils Relative: 0 %
LYMPHS ABS: 0.7 10*3/uL (ref 0.7–4.0)
LYMPHS PCT: 11 %
Monocytes Absolute: 0.5 10*3/uL (ref 0.1–1.0)
Monocytes Relative: 8 %
NEUTROS ABS: 5.2 10*3/uL (ref 1.7–7.7)
Neutrophils Relative %: 81 %

## 2016-06-10 LAB — CBG MONITORING, ED: GLUCOSE-CAPILLARY: 88 mg/dL (ref 65–99)

## 2016-06-10 MED ORDER — VANCOMYCIN 50 MG/ML ORAL SOLUTION: 125.0000 mg | Freq: Four times a day (QID) | ORAL | 0 refills | Status: DC

## 2016-06-10 MED ORDER — SODIUM CHLORIDE 0.9 % IV BOLUS (SEPSIS)
1000.0000 mL | Freq: Once | INTRAVENOUS | Status: AC
  Administered 2016-06-10: 1000 mL via INTRAVENOUS

## 2016-06-10 NOTE — Discharge Instructions (Signed)
OTC probiotic twice a day

## 2016-06-10 NOTE — ED Provider Notes (Signed)
Pt signed out by Dr. Roxanne Mins.  Pt able to eat breakfast.  No further diarrhea.  She is no longer orthostatic.  She is feeling better.  C.diff test will likely still be positive, but she has not had any stool to check.  As pt had so much diarrhea that she was orthostatic, I will start her back on vancomycin.  Pt also encouraged to continue probiotics.  Pt knows to return if worse.   Sydney Pence, MD 06/10/16 1004

## 2016-06-10 NOTE — ED Provider Notes (Signed)
[facility] DEPT Provider Note   CSN: [id number] Arrival date & time: 06/10/16  [id number]  By signing my name below, I, Collene Leyden, attest that this documentation has been prepared under the direction and in the presence of Delora Fuel, MD. Electronically Signed: Collene Leyden, Scribe. 06/10/16. 3:58 AM.  History   Chief Complaint Chief Complaint  Patient presents with  . Loss of Consciousness    HPI Comments: Sydney [name] is a 83 y.o. female with a history of TIA, HLD,and HTN, who presents to the Emergency Department via EMS, complaining of loss of consciousness that happened this morning. Per daughter the patient was on the toilet when she suddenly became unconscious, unresponsive. Patient was unresponsive for several minutes. Patient has associated diarrhea (x1 day, 7-8 episodes), weakness, and a right-sided headache (no longer present). Patient was admitted two weeks ago for colitis, in which she was discharged with antibiotics. No modifying factors indicated. Patient denies any abdominal pain, fever, light headedness, chest pain, nausea, or diaphoresis.   The history is provided by the patient and a relative. No language interpreter was used.    Past Medical History:  Diagnosis Date  . Arthritis    "left hand" (02/17/2013)  . Glaucoma   . High cholesterol    "on RX years ago" (02/17/2013)  . Hypertension   . Meningioma ([place]) 05/01/2015  . Osteoporosis 02/2014   T score -2.5  followed by Dr. Lysle Rubens  . Palpitations    PACs, PVCs and short runs of atrial tachycardia on Holter monitoring  . TIA (transient ischemic attack) 1990's    Patient Active Problem List   Diagnosis Date Noted  . Hypokalemia 05/14/2016  . Anemia 05/14/2016  . Hyponatremia 05/14/2016  . Glaucoma 05/14/2016  . Enteritis 05/14/2016  . Meningioma ([place]) 05/01/2015  . 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 Diplopia 04/08/2015  . Leukopenia 04/08/2015  . Bradycardia 02/17/2013  . Occlusion and stenosis  of carotid artery without mention of cerebral infarction 02/17/2013    Past Surgical History:  Procedure Laterality Date  . ANAL FISTULECTOMY  1970  . TONSILLECTOMY      OB History    Gravida Para Term Preterm AB Living   1 1 1     1    SAB TAB Ectopic Multiple Live Births                   Home Medications    Prior to Admission medications   Medication Sig Start Date End Date Taking? Authorizing Provider  aspirin (ECOTRIN LOW STRENGTH) 81 MG EC tablet Take 81 mg by mouth daily. Swallow whole.    Historical Provider, MD  cholecalciferol (VITAMIN D) 1000 UNITS tablet Take 1,000 Units by mouth daily.     Historical Provider, MD  clopidogrel (PLAVIX) 75 MG tablet Take 75 mg by mouth daily.    Historical Provider, MD  fluticasone (FLONASE) 50 MCG/ACT nasal spray Place 2 sprays into both nostrils daily. Patient taking differently: Place 2 sprays into both nostrils daily as needed for allergies or rhinitis.  01/11/14   Linton Flemings, MD  folic acid (FOLVITE) 1 MG tablet Take 1 mg by mouth daily.    Historical Provider, MD  ibandronate (BONIVA) 150 MG tablet Take 1 tablet by mouth every 30 (thirty) days. 03/20/14   Historical Provider, MD  ketoconazole (NIZORAL) 2 % shampoo Apply 1 application topically daily as needed for irritation.  12/19/13   Historical Provider, MD  latanoprost (XALATAN) 0.005 % ophthalmic solution Place  1 drop into both eyes at bedtime.     Historical Provider, MD  loratadine (CLARITIN) 10 MG tablet Take 10 mg by mouth daily as needed for allergies (for seasonal allergies).    Historical Provider, MD  metoprolol (LOPRESSOR) 50 MG tablet Take 25 mg by mouth 2 (two) times daily.  03/14/15   Historical Provider, MD  Saccharomyces boulardii (FLORASTOR PO) Take 1 tablet by mouth 2 (two) times daily. For 7 days    Historical Provider, MD  traMADol (ULTRAM) 50 MG tablet Take 50 mg by mouth daily as needed (for pain).  04/04/15   Historical Provider, MD  vancomycin (VANCOCIN) 50  mg/mL oral solution Take 2.5 mLs (125 mg total) by mouth 4 (four) times daily -  before meals and at bedtime. 05/16/16   Donne Hazel, MD    Family History Family History  Problem Relation Age of Onset  . Hypertension Father   . Heart attack Father   . Stroke Brother   . Stroke Maternal Grandmother     Social History Social History  Substance Use Topics  . Smoking status: Never Smoker  . Smokeless tobacco: Never Used  . Alcohol use No     Allergies   Demerol [meperidine]; Codeine; Sulfa antibiotics; and Tomato   Review of Systems Review of Systems  Constitutional: Negative for diaphoresis and fever.  Cardiovascular: Negative for chest pain.  Gastrointestinal: Positive for diarrhea. Negative for abdominal pain, nausea and vomiting.  Neurological: Positive for weakness and headaches. Negative for light-headedness.  All other systems reviewed and are negative.    Physical Exam Updated Vital Signs BP 124/70 (BP Location: Right Arm)   Pulse 67   Resp 14   Ht 5\' 4"  (1.626 m)   Wt 107 lb (48.5 kg)   SpO2 100%   BMI 18.37 kg/m   Physical Exam  Constitutional: She is oriented to person, place, and time. She appears well-developed and well-nourished.  HENT:  Head: Normocephalic and atraumatic.  Eyes: EOM are normal. Pupils are equal, round, and reactive to light.  Neck: Normal range of motion. Neck supple. No JVD present.  Cardiovascular: Normal rate, regular rhythm and normal heart sounds.   No murmur heard. Pulmonary/Chest: Effort normal and breath sounds normal. She has no wheezes. She has no rales. She exhibits no tenderness.  Abdominal: Soft. She exhibits no distension and no mass. There is no tenderness.  Bowel sounds decreased.   Musculoskeletal: Normal range of motion. She exhibits no edema.  Lymphadenopathy:    She has no cervical adenopathy.  Neurological: She is alert and oriented to person, place, and time. No cranial nerve deficit. She exhibits normal  muscle tone. Coordination normal.  Skin: Skin is warm and dry. No rash noted.  Psychiatric: She has a normal mood and affect. Her behavior is normal. Judgment and thought content normal.  Nursing note and vitals reviewed.    ED Treatments / Results  DIAGNOSTIC STUDIES: Oxygen Saturation is 100% on RA, normal by my interpretation.    COORDINATION OF CARE: 3:57 AM Discussed treatment plan with pt at bedside and pt agreed to plan, which includes a stool sample.   Labs (all labs ordered are listed, but only abnormal results are displayed) Labs Reviewed  BASIC METABOLIC PANEL - Abnormal; Notable for the following:       Result Value   Potassium 3.3 (*)    CO2 20 (*)    Calcium 8.7 (*)    All other components within normal limits  CBC - Abnormal; Notable for the following:    RBC 3.39 (*)    Hemoglobin 10.1 (*)    HCT 31.1 (*)    All other components within normal limits  URINALYSIS, ROUTINE W REFLEX MICROSCOPIC - Abnormal; Notable for the following:    Hgb urine dipstick SMALL (*)    Bacteria, UA RARE (*)    Squamous Epithelial / LPF 0-5 (*)    All other components within normal limits  HEPATIC FUNCTION PANEL - Abnormal; Notable for the following:    Total Protein 6.1 (*)    Albumin 3.0 (*)    Bilirubin, Direct <0.1 (*)    All other components within normal limits  C DIFFICILE QUICK SCREEN W PCR REFLEX  DIFFERENTIAL  CBG MONITORING, ED  I-STAT CG4 LACTIC ACID, ED  I-STAT TROPOININ, ED    EKG  EKG Interpretation  Date/Time:  Wednesday June 10 2016 03:54:58 EST Ventricular Rate:  67 PR Interval:    QRS Duration: 80 QT Interval:  390 QTC Calculation: 412 R Axis:   56 Text Interpretation:  Sinus or ectopic atrial rhythm Consider left ventricular hypertrophy Anterior Q waves, possibly due to LVH Nonspecific T wave abnormality When compared with ECG of 09/24/2013, No significant change was found Confirmed by [facility]  MD, Alister Staver ([id number]) on 06/10/2016 4:00:01 AM Also  confirmed by Roxanne Mins  MD, Jumanah Hynson ([id number]), editor [place] [facility], Leda Gauze [phone])  on 06/10/2016 7:44:04 AM       Procedures Procedures (including critical care time)  Medications Ordered in ED Medications  sodium chloride 0.9 % bolus 1,000 mL (0 mLs Intravenous Stopped 06/10/16 0727)     Initial Impression / Assessment and Plan / ED Course  I have reviewed the triage vital signs and the nursing notes.  Pertinent lab results that were available during my care of the patient were reviewed by me and considered in my medical decision making (see chart for details).  Diarrhea with episode of decreased responsiveness but not true syncope. This may have been vagal, may been related to dehydration. Old records are reviewed, and she had been admitted on January 18 with Clostridium difficile colitis and was treated with vancomycin. Will check stool sample to see if this is recurrent Clostridium difficile. Orthostatic vital signs were obtained demonstrating a drop in blood pressure that was not clinically significant, but may represent some degree of dehydration so she is given some IV fluids. At this point, she is not unable to provide a stool sample for repeat C. difficile toxin.  Patient is feeling better. She is being held in the ED to provide a stool sample for C. difficile toxin. Case is signed out to Dr. Gilford Raid. If toxin test is positive, she may need to go back on oral vancomycin.  Final Clinical Impressions(s) / ED Diagnoses   Final diagnoses:  Diarrhea, unspecified type    New Prescriptions New Prescriptions   No medications on file   I personally performed the services described in this documentation, which was scribed in my presence. The recorded information has been reviewed and is accurate.      Delora Fuel, MD [id number] [id number]

## 2016-06-10 NOTE — ED Triage Notes (Signed)
PT comes from home after having syncopal episode while bearing down to have BM this am. PT daughter reports she was in the rest room speaking with her mother when she went unconscious. Daughter states she was unresponsive for several minutes. Pt did not fall. EMS report pt initial bp was 80/54 but later improved to 126/70. PT denies n/v. She endorses diarrhea starting yesterday (7-8 episodes). Pt also reports being admitted 2 weeks ago for colitis.

## 2016-07-15 ENCOUNTER — Emergency Department (HOSPITAL_COMMUNITY)
Admission: EM | Admit: 2016-07-15 | Discharge: 2016-07-15 | Disposition: A | Discharge status: Home / Self Care | Payer: Medicare Other | Attending: Emergency Medicine | Admitting: Emergency Medicine

## 2016-07-15 ENCOUNTER — Encounter (HOSPITAL_COMMUNITY): Payer: Self-pay | Admitting: Nurse Practitioner

## 2016-07-15 DIAGNOSIS — I10 Essential (primary) hypertension: Secondary | ICD-10-CM | POA: Diagnosis not present

## 2016-07-15 DIAGNOSIS — R197 Diarrhea, unspecified: Secondary | ICD-10-CM | POA: Insufficient documentation

## 2016-07-15 DIAGNOSIS — Z8673 Personal history of transient ischemic attack (TIA), and cerebral infarction without residual deficits: Secondary | ICD-10-CM | POA: Diagnosis not present

## 2016-07-15 DIAGNOSIS — Z7982 Long term (current) use of aspirin: Secondary | ICD-10-CM | POA: Diagnosis not present

## 2016-07-15 DIAGNOSIS — Z79899 Other long term (current) drug therapy: Secondary | ICD-10-CM | POA: Diagnosis not present

## 2016-07-15 DIAGNOSIS — E876 Hypokalemia: Secondary | ICD-10-CM | POA: Diagnosis not present

## 2016-07-15 DIAGNOSIS — R1110 Vomiting, unspecified: Secondary | ICD-10-CM | POA: Diagnosis present

## 2016-07-15 LAB — LIPASE, BLOOD: LIPASE: 36 U/L (ref 11–51)

## 2016-07-15 LAB — COMPREHENSIVE METABOLIC PANEL
ALT: 16 U/L (ref 14–54)
ANION GAP: 8 (ref 5–15)
AST: 24 U/L (ref 15–41)
Albumin: 3.5 g/dL (ref 3.5–5.0)
Alkaline Phosphatase: 73 U/L (ref 38–126)
BILIRUBIN TOTAL: 0.9 mg/dL (ref 0.3–1.2)
BUN: 17 mg/dL (ref 6–20)
CO2: 24 mmol/L (ref 22–32)
Calcium: 9.1 mg/dL (ref 8.9–10.3)
Chloride: 107 mmol/L (ref 101–111)
Creatinine, Ser: 0.61 mg/dL (ref 0.44–1.00)
Glucose, Bld: 86 mg/dL (ref 65–99)
POTASSIUM: 3 mmol/L — AB (ref 3.5–5.1)
Sodium: 139 mmol/L (ref 135–145)
TOTAL PROTEIN: 7 g/dL (ref 6.5–8.1)

## 2016-07-15 LAB — CBC WITH DIFFERENTIAL/PLATELET
BASOS ABS: 0 10*3/uL (ref 0.0–0.1)
BASOS PCT: 0 %
Eosinophils Absolute: 0 10*3/uL (ref 0.0–0.7)
Eosinophils Relative: 0 %
HEMATOCRIT: 33.6 % — AB (ref 36.0–46.0)
Hemoglobin: 11.1 g/dL — ABNORMAL LOW (ref 12.0–15.0)
Lymphocytes Relative: 4 %
Lymphs Abs: 0.4 10*3/uL — ABNORMAL LOW (ref 0.7–4.0)
MCH: 30 pg (ref 26.0–34.0)
MCHC: 33 g/dL (ref 30.0–36.0)
MCV: 90.8 fL (ref 78.0–100.0)
MONO ABS: 0.9 10*3/uL (ref 0.1–1.0)
Monocytes Relative: 9 %
NEUTROS ABS: 8.5 10*3/uL — AB (ref 1.7–7.7)
NEUTROS PCT: 87 %
Platelets: 288 10*3/uL (ref 150–400)
RBC: 3.7 MIL/uL — AB (ref 3.87–5.11)
RDW: 14.3 % (ref 11.5–15.5)
WBC: 9.9 10*3/uL (ref 4.0–10.5)

## 2016-07-15 LAB — C DIFFICILE QUICK SCREEN W PCR REFLEX
C DIFFICILE (CDIFF) TOXIN: NEGATIVE
C Diff antigen: POSITIVE — AB

## 2016-07-15 LAB — CLOSTRIDIUM DIFFICILE BY PCR: Toxigenic C. Difficile by PCR: POSITIVE — AB

## 2016-07-15 MED ORDER — ONDANSETRON HCL 4 MG/2ML IJ SOLN
4.0000 mg | Freq: Once | INTRAMUSCULAR | Status: DC
  Filled 2016-07-15: qty 2

## 2016-07-15 MED ORDER — SODIUM CHLORIDE 0.9 % IV BOLUS (SEPSIS)
1000.0000 mL | Freq: Once | INTRAVENOUS | Status: AC
Start: 2016-07-15 — End: 2016-07-15
  Administered 2016-07-15: 1000 mL via INTRAVENOUS

## 2016-07-15 MED ORDER — POTASSIUM CHLORIDE CRYS ER 20 MEQ PO TBCR
60.0000 meq | EXTENDED_RELEASE_TABLET | Freq: Once | ORAL | Status: AC
  Administered 2016-07-15: 60 meq via ORAL
  Filled 2016-07-15: qty 3

## 2016-07-15 NOTE — ED Notes (Signed)
EDP  notified pf patient's c/o bilateral leg pain.

## 2016-07-15 NOTE — ED Notes (Signed)
Patient's daughter very demanding. Patient's daughter stated, "the doctor said she could have something to drink, but no one brought her anything. Is this something I need to do myself or are you capable of doing this ?." in a very demeaning tone.

## 2016-07-15 NOTE — ED Notes (Signed)
Bed: JQ49 Expected date:  Expected time:  Means of arrival:  Comments: EMS 83 yo female from home/loose stools since 0100/patient reports 90/60 BP-EMS states BP 170/110-out of BP meds for 1 month

## 2016-07-15 NOTE — ED Notes (Signed)
Patient c/o bilateral leg pain and states that her legs did not hurt like they do now until she laid down.

## 2016-07-15 NOTE — ED Triage Notes (Signed)
Pt arrived via EMS from home. Per ems patient states she began having loose stools on yesterday evening nonstop. She has in relation became very weak. She has a bp monitor at home and when she checked it this morning because of weakness it was 90/60. She denies any signs of blood in stool, urinary complications, fever, chills, or discomfort. She is having some intermittent abdominal pain and cramping. She informed ems that she was on vancomycin and was discontinued on Friday. VSS 139/84 with ems. Patient is A&Ox4.

## 2016-07-15 NOTE — ED Notes (Signed)
Enteric isolation initiated.

## 2016-07-15 NOTE — ED Notes (Signed)
Patient wanted potassium tabs to be broken in half and dropped a half of one of the tabs. Patient had 5/6 pieces.

## 2016-07-15 NOTE — ED Notes (Signed)
Patient given ginger ale and a blanket.

## 2016-07-15 NOTE — ED Provider Notes (Signed)
[facility] DEPT Provider Note   CSN: [id number] Arrival date & time: 07/15/16  0708     History   Chief Complaint Chief Complaint  Patient presents with  . Emesis  . Nausea    HPI Sydney [name] is a 83 y.o. female.  HPI  Patient presents with concern of diarrhea. Patient has a notable history of recent history of difficile colitis. She completed her course of oral vancomycin 5 days ago. She notes that she has recently been doing well, with increased appetite, no ongoing nausea, vomiting, diarrhea. However, about 5 hours ago the patient awoke, and soon thereafter had at least 5 episodes of loose stool. No frank blood, no abdominal pain, no nausea, no vomiting. No fever, no chills, no generalized weakness. No other recent changes in medication.   Past Medical History:  Diagnosis Date  . Arthritis    "left hand" (02/17/2013)  . Glaucoma   . High cholesterol    "on RX years ago" (02/17/2013)  . Hypertension   . Meningioma ([place]) 05/01/2015  . Osteoporosis 02/2014   T score -2.5  followed by Dr. Lysle Rubens  . Palpitations    PACs, PVCs and short runs of atrial tachycardia on Holter monitoring  . TIA (transient ischemic attack) 1990's    Patient Active Problem List   Diagnosis Date Noted  . Hypokalemia 05/14/2016  . Anemia 05/14/2016  . Hyponatremia 05/14/2016  . Glaucoma 05/14/2016  . Enteritis 05/14/2016  . Meningioma ([place]) 05/01/2015  . 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 Diplopia 04/08/2015  . Leukopenia 04/08/2015  . Bradycardia 02/17/2013  . Occlusion and stenosis of carotid artery without mention of cerebral infarction 02/17/2013    Past Surgical History:  Procedure Laterality Date  . ANAL FISTULECTOMY  1970  . TONSILLECTOMY      OB History    Gravida Para Term Preterm AB Living   1 1 1     1    SAB TAB Ectopic Multiple Live Births                   Home Medications    Prior to Admission medications   Medication Sig Start Date End Date  Taking? Authorizing Provider  aspirin (ECOTRIN LOW STRENGTH) 81 MG EC tablet Take 81 mg by mouth daily. Swallow whole.    Historical Provider, MD  cholecalciferol (VITAMIN D) 1000 UNITS tablet Take 1,000 Units by mouth daily.     Historical Provider, MD  clopidogrel (PLAVIX) 75 MG tablet Take 75 mg by mouth daily.    Historical Provider, MD  fluticasone (FLONASE) 50 MCG/ACT nasal spray Place 2 sprays into both nostrils daily. Patient taking differently: Place 2 sprays into both nostrils daily as needed for allergies or rhinitis.  01/11/14   Linton Flemings, MD  folic acid (FOLVITE) 1 MG tablet Take 1 mg by mouth daily.    Historical Provider, MD  latanoprost (XALATAN) 0.005 % ophthalmic solution Place 1 drop into both eyes at bedtime.     Historical Provider, MD  loratadine (CLARITIN) 10 MG tablet Take 10 mg by mouth daily as needed for allergies (for seasonal allergies).    Historical Provider, MD  metoprolol (LOPRESSOR) 50 MG tablet Take 25 mg by mouth 2 (two) times daily.  03/14/15   Historical Provider, MD  vancomycin (VANCOCIN) 50 mg/mL oral solution Take 2.5 mLs (125 mg total) by mouth every 6 (six) hours. 06/10/16   Isla Pence, MD    Family History Family History  Problem Relation  Age of Onset  . Hypertension Father   . Heart attack Father   . Stroke Brother   . Stroke Maternal Grandmother     Social History Social History  Substance Use Topics  . Smoking status: Never Smoker  . Smokeless tobacco: Never Used  . Alcohol use No     Allergies   Demerol [meperidine]; Codeine; Sulfa antibiotics; and Tomato   Review of Systems Review of Systems  Constitutional:       Per HPI, otherwise negative  HENT:       Per HPI, otherwise negative  Respiratory:       Per HPI, otherwise negative  Cardiovascular:       Per HPI, otherwise negative  Gastrointestinal: Positive for diarrhea. Negative for vomiting.  Endocrine:       Negative aside from HPI  Genitourinary:       Neg aside  from HPI   Musculoskeletal:       Ongoing evaluation for chronic right shoulder discomfort, no notable changes today  Skin: Negative.   Neurological: Positive for weakness. Negative for syncope.     Physical Exam Updated Vital Signs There were no vitals taken for this visit.  Physical Exam  Constitutional: She is oriented to person, place, and time. She appears well-developed and well-nourished. No distress.  HENT:  Head: Normocephalic and atraumatic.  Eyes: Conjunctivae and EOM are normal.  Cardiovascular: Normal rate and regular rhythm.   Pulmonary/Chest: Effort normal and breath sounds normal. No stridor. No respiratory distress.  Abdominal: She exhibits no distension and no mass. There is no tenderness. There is no rebound and no guarding.  Musculoskeletal: She exhibits no edema.  Neurological: She is alert and oriented to person, place, and time. No cranial nerve deficit.  Skin: Skin is warm and dry.  Psychiatric: She has a normal mood and affect.  Nursing note and vitals reviewed.  Chart review notable for recent Clostridium difficile infection  ED Treatments / Results  Labs (all labs ordered are listed, but only abnormal results are displayed) Labs Reviewed  C DIFFICILE QUICK SCREEN W PCR REFLEX  COMPREHENSIVE METABOLIC PANEL  LIPASE, BLOOD  CBC WITH DIFFERENTIAL/PLATELET  URINALYSIS, ROUTINE W REFLEX MICROSCOPIC    Procedures Procedures (including critical care time)  Medications Ordered in ED Medications  sodium chloride 0.9 % bolus 1,000 mL (not administered)  ondansetron (ZOFRAN) injection 4 mg (not administered)   Initial blood pressure 120/70. Given the patient's reported hypotension at home, she will receive IV fluids, while evaluation is being conducted.   Initial Impression / Assessment and Plan / ED Course  I have reviewed the triage vital signs and the nursing notes.  Pertinent labs & imaging results that were available during my care of the  patient were reviewed by me and considered in my medical decision making (see chart for details).  11:16 AM Patient has had 2 episodes of diarrhea since arrival, but no sustained bowel movements. No vomiting. She is awake, alert. With her daughter presently discussed all findings appear With obtain a sample, and additional testing for C. difficile should be available within the next day. With no fever, no hemodynamically instability, reassuring labs, patient will follow-up as an outpatient, and be made aware of abnormal finding. Patient's low potassium was addressed with supplementation here. Patient feels generally well, states that she feels better after fluid resuscitation, has a physician with whom she can follow-up, and with otherwise reassuring findings, no indication for hospitalization.   Final Clinical Impressions(s) /  ED Diagnoses  Diarrhea Hypokalemia    Carmin Muskrat, MD 07/15/16 1118

## 2016-07-15 NOTE — ED Notes (Signed)
Patient assisted to the bathroom by 2 staff members, but patient was able to ambulate on her own.

## 2016-07-15 NOTE — Discharge Instructions (Signed)
As discussed, today's evaluation has been generally reassuring aside from mild low potassium levels. Studies are being conducted on your stool sample to evaluate if your infection has recurred. If you receive a phone call about a positive result, please contact your primary care physician, to have antibiotics initiated again.  You may consider using loperamide or Imodium for symptom relief.  Regardless, it is important that you follow-up with your physician within one week for repeat evaluation, and consideration of additional blood studies to check your potassium level.  Return here for concerning changes in your condition.

## 2016-07-29 ENCOUNTER — Emergency Department (HOSPITAL_COMMUNITY)
Admission: EM | Admit: 2016-07-29 | Discharge: 2016-07-29 | Disposition: A | Discharge status: Home / Self Care | Payer: Medicare Other | Attending: Physician Assistant | Admitting: Physician Assistant

## 2016-07-29 ENCOUNTER — Encounter (HOSPITAL_COMMUNITY): Payer: Self-pay | Admitting: Emergency Medicine

## 2016-07-29 DIAGNOSIS — E860 Dehydration: Secondary | ICD-10-CM

## 2016-07-29 DIAGNOSIS — Z8673 Personal history of transient ischemic attack (TIA), and cerebral infarction without residual deficits: Secondary | ICD-10-CM | POA: Diagnosis not present

## 2016-07-29 DIAGNOSIS — Z79899 Other long term (current) drug therapy: Secondary | ICD-10-CM | POA: Diagnosis not present

## 2016-07-29 DIAGNOSIS — Z7982 Long term (current) use of aspirin: Secondary | ICD-10-CM | POA: Insufficient documentation

## 2016-07-29 DIAGNOSIS — A0472 Enterocolitis due to Clostridium difficile, not specified as recurrent: Secondary | ICD-10-CM | POA: Diagnosis not present

## 2016-07-29 DIAGNOSIS — I10 Essential (primary) hypertension: Secondary | ICD-10-CM | POA: Insufficient documentation

## 2016-07-29 DIAGNOSIS — R197 Diarrhea, unspecified: Secondary | ICD-10-CM | POA: Diagnosis present

## 2016-07-29 DIAGNOSIS — E876 Hypokalemia: Secondary | ICD-10-CM | POA: Diagnosis not present

## 2016-07-29 LAB — BASIC METABOLIC PANEL
Anion gap: 8 (ref 5–15)
BUN: 8 mg/dL (ref 6–20)
CHLORIDE: 106 mmol/L (ref 101–111)
CO2: 21 mmol/L — AB (ref 22–32)
CREATININE: 0.7 mg/dL (ref 0.44–1.00)
Calcium: 8.5 mg/dL — ABNORMAL LOW (ref 8.9–10.3)
GFR calc Af Amer: >60 mL/min (ref >60)
GFR calc non Af Amer: >60 mL/min (ref >60)
GLUCOSE: 105 mg/dL — AB (ref 65–99)
Potassium: 2.8 mmol/L — ABNORMAL LOW (ref 3.5–5.1)
Sodium: 135 mmol/L (ref 135–145)

## 2016-07-29 LAB — CBC WITH DIFFERENTIAL/PLATELET
Basophils Absolute: 0 10*3/uL (ref 0.0–0.1)
Basophils Relative: 0 %
EOS ABS: 0 10*3/uL (ref 0.0–0.7)
Eosinophils Relative: 0 %
HEMATOCRIT: 32.2 % — AB (ref 36.0–46.0)
HEMOGLOBIN: 10.7 g/dL — AB (ref 12.0–15.0)
LYMPHS ABS: 0.4 10*3/uL — AB (ref 0.7–4.0)
Lymphocytes Relative: 5 %
MCH: 29.9 pg (ref 26.0–34.0)
MCHC: 33.2 g/dL (ref 30.0–36.0)
MCV: 89.9 fL (ref 78.0–100.0)
MONO ABS: 0.4 10*3/uL (ref 0.1–1.0)
MONOS PCT: 4 %
NEUTROS ABS: 8.3 10*3/uL — AB (ref 1.7–7.7)
NEUTROS PCT: 91 %
Platelets: 215 10*3/uL (ref 150–400)
RBC: 3.58 MIL/uL — ABNORMAL LOW (ref 3.87–5.11)
RDW: 13.5 % (ref 11.5–15.5)
WBC: 9.1 10*3/uL (ref 4.0–10.5)

## 2016-07-29 LAB — POTASSIUM: POTASSIUM: 3.2 mmol/L — AB (ref 3.5–5.1)

## 2016-07-29 LAB — MAGNESIUM: MAGNESIUM: 1.6 mg/dL — AB (ref 1.7–2.4)

## 2016-07-29 LAB — C DIFFICILE QUICK SCREEN W PCR REFLEX
C DIFFICILE (CDIFF) INTERP: DETECTED
C DIFFICILE (CDIFF) TOXIN: POSITIVE — AB
C Diff antigen: POSITIVE — AB

## 2016-07-29 MED ORDER — SODIUM CHLORIDE 0.9 % IV SOLN
30.0000 meq | Freq: Once | INTRAVENOUS | Status: AC
  Administered 2016-07-29: 30 meq via INTRAVENOUS
  Filled 2016-07-29: qty 15

## 2016-07-29 MED ORDER — MAGNESIUM SULFATE 2 GM/50ML IV SOLN
2.0000 g | Freq: Once | INTRAVENOUS | Status: AC
  Administered 2016-07-29: 2 g via INTRAVENOUS
  Filled 2016-07-29: qty 50

## 2016-07-29 MED ORDER — POTASSIUM CHLORIDE CRYS ER 20 MEQ PO TBCR: 40.0000 meq | EXTENDED_RELEASE_TABLET | Freq: Every day | ORAL | 0 refills | Status: DC

## 2016-07-29 MED ORDER — SODIUM CHLORIDE 0.9 % IV BOLUS (SEPSIS)
1000.0000 mL | Freq: Once | INTRAVENOUS | Status: AC
  Administered 2016-07-29: 1000 mL via INTRAVENOUS

## 2016-07-29 MED ORDER — VANCOMYCIN HCL 125 MG PO CAPS: 125.0000 mg | ORAL_CAPSULE | Freq: Four times a day (QID) | ORAL | 0 refills | Status: AC

## 2016-07-29 MED ORDER — POTASSIUM CHLORIDE CRYS ER 20 MEQ PO TBCR
40.0000 meq | EXTENDED_RELEASE_TABLET | Freq: Once | ORAL | Status: AC
  Administered 2016-07-29: 40 meq via ORAL
  Filled 2016-07-29: qty 2

## 2016-07-29 NOTE — ED Notes (Signed)
Pt. Given beverage w/ EDP approval

## 2016-07-29 NOTE — ED Provider Notes (Signed)
[facility] DEPT Provider Note   CSN: [id number] Arrival date & time: 07/29/16  0203  By signing my name below, I, Oleh Genin, attest that this documentation has been prepared under the direction and in the presence of Ripley Fraise, MD. Electronically Signed: Oleh Genin, Scribe. 07/29/16. 2:28 AM.   History   Chief Complaint Chief Complaint  Patient presents with  . Abdominal Pain  . Diarrhea  . Loss of Consciousness    HPI Sydney [name] is a 83 y.o. female with history of HTN, HLD, and prior TIA who presents to the ED following a witnessed syncopal episode. This patient states that in the last 12 hours she has had approximately 10 diarrheal episodes. A few hours ago she was having a bowel movement and went unresponsive with family members.  She was not apneic.  No falls. The daughter called EMS. At interview, she is at her baseline mental status. She denies any fever, headache, or generalized weakness. No chest pain, back pain, or abdominal pain. No vomiting. No rectal bleeding. The patient has been seen a few times in the last 4 months for diarrhea complaints; in 04/2015 she was admitted and found to have C.diff. She has since completed a course of antibiotics.   The history is provided by the patient. No language interpreter was used.  Diarrhea   This is a recurrent problem. The current episode started 6 to 12 hours ago. The problem occurs more than 10 times per day. The problem has been gradually improving. The stool consistency is described as watery. There has been no fever. Pertinent negatives include no abdominal pain, no vomiting and no headaches.    Past Medical History:  Diagnosis Date  . Arthritis    "left hand" (02/17/2013)  . Glaucoma   . High cholesterol    "on RX years ago" (02/17/2013)  . Hypertension   . Meningioma ([place]) 05/01/2015  . Osteoporosis 02/2014   T score -2.5  followed by Dr. Lysle Rubens  . Palpitations    PACs, PVCs and short runs of  atrial tachycardia on Holter monitoring  . TIA (transient ischemic attack) 1990's    Patient Active Problem List   Diagnosis Date Noted  . Hypokalemia 05/14/2016  . Anemia 05/14/2016  . Hyponatremia 05/14/2016  . Glaucoma 05/14/2016  . Enteritis 05/14/2016  . Meningioma ([place]) 05/01/2015  . 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 Diplopia 04/08/2015  . Leukopenia 04/08/2015  . Bradycardia 02/17/2013  . Occlusion and stenosis of carotid artery without mention of cerebral infarction 02/17/2013    Past Surgical History:  Procedure Laterality Date  . ANAL FISTULECTOMY  1970  . TONSILLECTOMY      OB History    Gravida Para Term Preterm AB Living   1 1 1     1    SAB TAB Ectopic Multiple Live Births                   Home Medications    Prior to Admission medications   Medication Sig Start Date End Date Taking? Authorizing Provider  aspirin (ECOTRIN LOW STRENGTH) 81 MG EC tablet Take 81 mg by mouth daily. Swallow whole.    Historical Provider, MD  cholecalciferol (VITAMIN D) 1000 UNITS tablet Take 1,000 Units by mouth daily.     Historical Provider, MD  clopidogrel (PLAVIX) 75 MG tablet Take 75 mg by mouth daily.    Historical Provider, MD  fluticasone (FLONASE) 50 MCG/ACT nasal spray Place 2 sprays into both nostrils  daily. Patient taking differently: Place 2 sprays into both nostrils daily as needed for allergies or rhinitis.  01/11/14   Linton Flemings, MD  folic acid (FOLVITE) 1 MG tablet Take 1 mg by mouth daily.    Historical Provider, MD  ibandronate (BONIVA) 150 MG tablet Take 150 mg by mouth every 30 (thirty) days. 05/05/16   Historical Provider, MD  latanoprost (XALATAN) 0.005 % ophthalmic solution Place 1 drop into both eyes at bedtime.     Historical Provider, MD  loratadine (CLARITIN) 10 MG tablet Take 10 mg by mouth daily as needed for allergies (for seasonal allergies).    Historical Provider, MD  vancomycin (VANCOCIN) 50 mg/mL oral solution Take 2.5 mLs (125 mg total) by  mouth every 6 (six) hours. Patient not taking: Reported on 07/15/2016 06/10/16   Isla Pence, MD    Family History Family History  Problem Relation Age of Onset  . Hypertension Father   . Heart attack Father   . Stroke Brother   . Stroke Maternal Grandmother     Social History Social History  Substance Use Topics  . Smoking status: Never Smoker  . Smokeless tobacco: Never Used  . Alcohol use No     Allergies   Demerol [meperidine]; Codeine; Sulfa antibiotics; and Tomato   Review of Systems Review of Systems  Constitutional: Negative for fatigue and fever.  Cardiovascular: Negative for chest pain.  Gastrointestinal: Positive for diarrhea. Negative for abdominal pain, blood in stool, nausea and vomiting.  Musculoskeletal: Negative for back pain.  Neurological: Negative for headaches.  All other systems reviewed and are negative.    Physical Exam Updated Vital Signs BP (!) 119/57 (BP Location: Left Arm)   Pulse 69   Resp 16   SpO2 100%   Physical Exam CONSTITUTIONAL: Elderly HEAD: Normocephalic/atraumatic EYES: EOMI/PERRL ENMT: Mucous membranes moist NECK: supple no meningeal signs SPINE/BACK:entire spine nontender CV: S1/S2 noted, no murmurs/rubs/gallops noted LUNGS: Lungs are clear to auscultation bilaterally, no apparent distress ABDOMEN: soft, nontender, no rebound or guarding, bowel sounds noted throughout abdomen GU:no cva tenderness NEURO: Pt is awake/alert/appropriate, moves all extremitiesx4.  No facial droop.   EXTREMITIES: pulses normal/equal, full ROM SKIN: warm, color normal PSYCH: no abnormalities of mood noted, alert and oriented to situation  ED Treatments / Results  Labs (all labs ordered are listed, but only abnormal results are displayed) Labs Reviewed  C DIFFICILE QUICK SCREEN W PCR REFLEX - Abnormal; Notable for the following:       Result Value   C Diff antigen POSITIVE (*)    C Diff toxin POSITIVE (*)    All other components  within normal limits  BASIC METABOLIC PANEL - Abnormal; Notable for the following:    Potassium 2.8 (*)    CO2 21 (*)    Glucose, Bld 105 (*)    Calcium 8.5 (*)    All other components within normal limits  CBC WITH DIFFERENTIAL/PLATELET - Abnormal; Notable for the following:    RBC 3.58 (*)    Hemoglobin 10.7 (*)    HCT 32.2 (*)    Neutro Abs 8.3 (*)    Lymphs Abs 0.4 (*)    All other components within normal limits  MAGNESIUM - Abnormal; Notable for the following:    Magnesium 1.6 (*)    All other components within normal limits  POTASSIUM    EKG  EKG Interpretation  Date/Time:  Wednesday July 29 2016 04:03:31 EDT Ventricular Rate:  73 PR Interval:  QRS Duration: 59 QT Interval:  494 QTC Calculation: 545 R Axis:   58 Text Interpretation:  Sinus arrhythmia Anteroseptal infarct, old Borderline T abnormalities, inferior leads Prolonged QT interval changed from prior Confirmed by Christy Gentles  MD, Dejan Angert ([id number]) on 07/29/2016 4:09:59 AM Also confirmed by Christy Gentles  MD, [place] ([id number]), editor Drema Pry [phone])  on 07/29/2016 6:48:05 AM       Radiology No results found.  Procedures Procedures   Medications Ordered in ED Medications  potassium chloride 30 mEq in sodium chloride 0.9 % 265 mL (KCL MULTIRUN) IVPB (30 mEq Intravenous Given 07/29/16 0454)  sodium chloride 0.9 % bolus 1,000 mL (0 mLs Intravenous Stopped 07/29/16 0407)  potassium chloride SA (K-DUR,KLOR-CON) CR tablet 40 mEq (40 mEq Oral Given 07/29/16 0502)  magnesium sulfate IVPB 2 g 50 mL (0 g Intravenous Stopped 07/29/16 0556)  sodium chloride 0.9 % bolus 1,000 mL (0 mLs Intravenous Stopped 07/29/16 0718)     Initial Impression / Assessment and Plan / ED Course  I have reviewed the triage vital signs and the nursing notes.  Pertinent labs & imaging results that were available during my care of the patient were reviewed by me and considered in my medical decision making (see chart for details).     7:39  AM Pt in the ED for repeat visit for diarrhea She has had ongoing issues with diarrhea and previous h/o cdif  This episode started last night, and she had so much volume loss she had syncopal episode This was similar to prior episodes  She is now improved She is ambulatory Due to HYPOkalemia, she was given KCL/magnesium (she had prolonged QT on EKG) She is well appearing abd soft and no focal tenderness  she is nontoxic in appearance 7:41 AM At signout to dr Thomasene Lot, recheck potassium If improved and she still feels well she can go home and restart her oral vancomycin with close PCP Followup   Final Clinical Impressions(s) / ED Diagnoses   Final diagnoses:  C. difficile diarrhea  Hypokalemia  Dehydration    New Prescriptions New Prescriptions   No medications on file  I personally performed the services described in this documentation, which was scribed in my presence. The recorded information has been reviewed and is accurate.       Ripley Fraise, MD 07/29/16 [phone]

## 2016-07-29 NOTE — ED Triage Notes (Addendum)
Patient arrived with EMS from home reports low abdominal pain with multiple diarrhea onset yesterday afternoon , denies emesis or fever , syncopal episode this morning 2 am while using the toilet at home . Alert at arrival with generalized weakness. She received NS IV 500 ml by EMS prior to arrival.

## 2016-07-29 NOTE — ED Notes (Signed)
Pt. Offered admission to hospital by EDP. Pt. States that she wanted to go home. Pt. daughter agreed to plan of being discharged and continuing vancomycin prescribed by PCP. Pt. Also educated by physician on when to come back to hospital if things got worse and when to take potassium replacement pills. Pt. Verbalized understanding.

## 2016-07-29 NOTE — Discharge Instructions (Signed)
Please continue take oral potassium at home as well as antibiotics at home. Please follow-up with your primary care physician in 3 days for lab recheck. Please return immediately if she is unable to stay hydrated at home or you've any concerns.

## 2016-07-29 NOTE — ED Provider Notes (Signed)
Signed out by Dr. Christy Gentles. Follow-up potassium and discharge home if normalized. Patient has normalized potassium. Had a long discussion with patient and daughter. Discussed the benefits and risks of inpatient admission versus discharge. Patient appears very well and was walking around the department independently with spunk. Patient has normal vital signs and normal labs at this point. We think is reasonable to trial by mouth antibiotics at home given the length that she's had C. difficile on and off. Return precautions discussed.    Topanga Alvelo Julio Alm, MD 07/29/16 1043

## 2016-08-20 ENCOUNTER — Encounter: Payer: Self-pay | Admitting: Internal Medicine

## 2016-08-20 ENCOUNTER — Ambulatory Visit (INDEPENDENT_AMBULATORY_CARE_PROVIDER_SITE_OTHER): Payer: Medicare Other | Admitting: Internal Medicine

## 2016-08-20 VITALS — BP 105/64 | HR 64 | Temp 98.2°F | Ht 64.0 in | Wt 100.0 lb

## 2016-08-20 DIAGNOSIS — A0471 Enterocolitis due to Clostridium difficile, recurrent: Secondary | ICD-10-CM | POA: Diagnosis present

## 2016-08-20 NOTE — Patient Instructions (Signed)
Take oral vancomycin 125mg  one tablet 3 times per day x 7 days followed by 125mg  twice a day x 7 days, followed by 125mg  once a day x 7 days, followed by 125mg  every 3 days x 10 doses

## 2016-08-20 NOTE — Progress Notes (Signed)
RFV: recurrent cdifficile  Patient ID: Sydney [name], female   DOB: 1930/06/29, 83 y.o.   MRN: [id number]  HPI 83yo F with hx of OA, HLD, htN, TIA who was admitted in mid January for 2 wk hx of diarrhea following recent courses of abtx for upper respiratoyr infeciton. She was found to have cdifficile colitis. Treated with 2 wk course of oral vancomycin. She was readmitted on mid February for 1 day hx of profuse diarrhea, vasovagal episode. She was not able to give further stool specimen, given oral vanco again per notes which she completed til march 15th, which roughly is 4 wk? Unclear if it was a taper.   In late march, prsented to the ed for diarrhea, nausea, vomiting. Found to have hypokalermia. Sent home of oral vanco and potassium supplementation  In ealry April, April 4th, she had loc/syncope after 10 diarrheal epsiodes. Called by ems.  .  In march cdiff pcr +/ant+toxin - In jan cdiff pcr+/ant+/toxin+   Outpatient Encounter Prescriptions as of 08/20/2016  Medication Sig  . aspirin (ECOTRIN LOW STRENGTH) 81 MG EC tablet Take 81 mg by mouth daily. Swallow whole.  . cholecalciferol (VITAMIN D) 1000 UNITS tablet Take 1,000 Units by mouth daily.   . clopidogrel (PLAVIX) 75 MG tablet Take 75 mg by mouth daily.  . fluticasone (FLONASE) 50 MCG/ACT nasal spray Place 2 sprays into both nostrils daily. (Patient taking differently: Place 2 sprays into both nostrils daily as needed for allergies or rhinitis. )  . folic acid (FOLVITE) 1 MG tablet Take 1 mg by mouth daily.  Marland Kitchen ibandronate (BONIVA) 150 MG tablet Take 150 mg by mouth every 30 (thirty) days.  Marland Kitchen latanoprost (XALATAN) 0.005 % ophthalmic solution Place 1 drop into both eyes at bedtime.   Marland Kitchen loratadine (CLARITIN) 10 MG tablet Take 10 mg by mouth daily as needed for allergies (for seasonal allergies).  . potassium chloride SA (K-DUR,KLOR-CON) 20 MEQ tablet Take 2 tablets (40 mEq total) by mouth daily.   No facility-administered  encounter medications on file as of 08/20/2016.      Patient Active Problem List   Diagnosis Date Noted  . Hypokalemia 05/14/2016  . Anemia 05/14/2016  . Hyponatremia 05/14/2016  . Glaucoma 05/14/2016  . Enteritis 05/14/2016  . Meningioma ([place]) 05/01/2015  . 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 Diplopia 04/08/2015  . Leukopenia 04/08/2015  . Bradycardia 02/17/2013  . Occlusion and stenosis of carotid artery without mention of cerebral infarction 02/17/2013     Health Maintenance Due  Topic Date Due  . TETANUS/TDAP  10/23/1951  . PNA vac Low Risk Adult (1 of 2 - PCV13) 10/22/1997    Social History  Substance Use Topics  . Smoking status: Never Smoker  . Smokeless tobacco: Never Used  . Alcohol use No  Sydney [name] had no medications administered during this visit. Review of Systems Review of Systems  Constitutional: Negative for fever, chills, diaphoresis, activity change, appetite change, fatigue and unexpected weight change.  HENT: Negative for congestion, sore throat, rhinorrhea, sneezing, trouble swallowing and sinus pressure.  Eyes: Negative for photophobia and visual disturbance.  Respiratory: Negative for cough, chest tightness, shortness of breath, wheezing and stridor.  Cardiovascular: Negative for chest pain, palpitations and leg swelling.  Gastrointestinal: Negative for nausea, vomiting, abdominal pain, diarrhea, constipation, blood in stool, abdominal distention and anal bleeding.  Genitourinary: Negative for dysuria, hematuria, flank pain and difficulty urinating.  Musculoskeletal: Negative for myalgias, back pain, joint swelling, arthralgias and  gait problem.  Skin: Negative for color change, pallor, rash and wound.  Neurological: Negative for dizziness, tremors, weakness and light-headedness.  Hematological: Negative for adenopathy. Does not bruise/bleed easily.  Psychiatric/Behavioral: Negative for behavioral problems, confusion, sleep disturbance, dysphoric  mood, decreased concentration and agitation.    Physical Exam  BP 105/64   Pulse 64   Temp 98.2 F (36.8 C) (Oral)   Ht _0  (1.626 m)   Wt 100 lb (45.4 kg)   BMI 17.16 kg/m  Physical Exam  Constitutional:  oriented to person, place, and time. appears well-developed and well-nourished. No distress.  HENT: [place]/AT, PERRLA, no scleral icterus Mouth/Throat: Oropharynx is clear and moist. No oropharyngeal exudate.  Cardiovascular: Normal rate, regular rhythm and normal heart sounds. Exam reveals no gallop and no friction rub.  No murmur heard.  Pulmonary/Chest: Effort normal and breath sounds normal. No respiratory distress.  has no wheezes.  Neck = supple, no nuchal rigidity Abdominal: Soft. Bowel sounds are normal.  exhibits no distension. There is no tenderness.  Lymphadenopathy: no cervical adenopathy. No axillary adenopathy Neurological: alert and oriented to person, place, and time.  Skin: Skin is warm and dry. No rash noted. No erythema.  Psychiatric: a normal mood and affect.  behavior is normal.    Lab Results  Component Value Date   LABRPR Non Reactive 05/01/2015    CBC Lab Results  Component Value Date   WBC 9.1 07/29/2016   RBC 3.58 (L) 07/29/2016   HGB 10.7 (L) 07/29/2016   HCT 32.2 (L) 07/29/2016   PLT 215 07/29/2016   MCV 89.9 07/29/2016   MCH 29.9 07/29/2016   MCHC 33.2 07/29/2016   RDW 13.5 07/29/2016   LYMPHSABS 0.4 (L) 07/29/2016   MONOABS 0.4 07/29/2016   EOSABS 0.0 07/29/2016    BMET Lab Results  Component Value Date   NA 135 07/29/2016   K 3.2 (L) 07/29/2016   CL 106 07/29/2016   CO2 21 (L) 07/29/2016   GLUCOSE 105 (H) 07/29/2016   BUN 8 07/29/2016   CREATININE 0.70 07/29/2016   CALCIUM 8.5 (L) 07/29/2016   GFRNONAA >60 07/29/2016   GFRAA >60 07/29/2016    I have reviewed her records pertaining to c.difficile  Assessment and Plan  Recurrent c.difficile infection = Will give taper on her current vancomycin dosing. If she has a bout  of diarrhea, after taper will retest. Probably try fidoxamicin and push towards FMT  She sees dr Wynetta Emery today for eval of fmt, opting for stool bank  Will give her a kit Will call into [facility]  Spent 45 min with patient explaining management of recurrent cdifficile and FMT

## 2016-08-26 ENCOUNTER — Telehealth: Payer: Self-pay | Admitting: *Deleted

## 2016-08-26 NOTE — Telephone Encounter (Signed)
Will call this afternoon once verify dose of vanc with [name].

## 2016-08-26 NOTE — Telephone Encounter (Signed)
Patient daughter called to advise that they have questions about how to take a medication the directions on the bottle do not match what they were told in the office. She did not say which medication so will have pharmacy give her a call to help.

## 2016-08-27 ENCOUNTER — Telehealth: Payer: Self-pay | Admitting: Pharmacist

## 2016-08-27 NOTE — Telephone Encounter (Signed)
Febe's [name] called wanting clarification on the vanc taper dosing. She states that the AVS from [name] stated to take the one tablet 3 times per day x 7 days and the prescription bottle from [facility] says x 5 days. Clarified that she needs to take it x 7 days like the AVS states and to use the remaining medication she has left over from the previous prescription.  Answered all her questions.  Patient and daughter verbalized understanding.

## 2016-09-24 ENCOUNTER — Ambulatory Visit (INDEPENDENT_AMBULATORY_CARE_PROVIDER_SITE_OTHER): Payer: Medicare Other | Admitting: Internal Medicine

## 2016-09-24 ENCOUNTER — Encounter: Payer: Self-pay | Admitting: Internal Medicine

## 2016-09-24 VITALS — BP 104/66 | HR 62 | Temp 97.5°F | Ht 63.0 in | Wt 100.0 lb

## 2016-09-24 DIAGNOSIS — A0471 Enterocolitis due to Clostridium difficile, recurrent: Secondary | ICD-10-CM | POA: Diagnosis present

## 2016-09-26 NOTE — Progress Notes (Signed)
RFV: recurrent c.difficile infection Patient ID: Sydney [name], female   DOB: 1930-06-16, 83 y.o.   MRN: [id number]  HPI Sydney [name] is a 83yo F currently on the long-oral vanco taper. She has 5 more doses over of oral vanco once very 5 days. She is gaining weight somewhat. She states she only has 1-2 bm per day. Otherwise no recent abtx, or flare.  Outpatient Encounter Prescriptions as of 09/24/2016  Medication Sig  . aspirin (ECOTRIN LOW STRENGTH) 81 MG EC tablet Take 81 mg by mouth daily. Swallow whole.  . cholecalciferol (VITAMIN D) 1000 UNITS tablet Take 1,000 Units by mouth daily.   . clopidogrel (PLAVIX) 75 MG tablet Take 75 mg by mouth daily.  . fluticasone (FLONASE) 50 MCG/ACT nasal spray Place 2 sprays into both nostrils daily. (Patient taking differently: Place 2 sprays into both nostrils daily as needed for allergies or rhinitis. )  . folic acid (FOLVITE) 1 MG tablet Take 1 mg by mouth daily.  Marland Kitchen latanoprost (XALATAN) 0.005 % ophthalmic solution Place 1 drop into both eyes at bedtime.   Marland Kitchen loratadine (CLARITIN) 10 MG tablet Take 10 mg by mouth daily as needed for allergies (for seasonal allergies).  . meloxicam (MOBIC) 7.5 MG tablet   . Saccharomyces boulardii (FLORASTOR PO) Take by mouth.  . vancomycin (VANCOCIN) 50 mg/mL oral solution Take 125 mg by mouth every 6 (six) hours.  . ibandronate (BONIVA) 150 MG tablet Take 150 mg by mouth every 30 (thirty) days.  . potassium chloride SA (K-DUR,KLOR-CON) 20 MEQ tablet Take 2 tablets (40 mEq total) by mouth daily. (Patient not taking: Reported on 09/24/2016)   No facility-administered encounter medications on file as of 09/24/2016.      Patient Active Problem List   Diagnosis Date Noted  . Hypokalemia 05/14/2016  . Anemia 05/14/2016  . Hyponatremia 05/14/2016  . Glaucoma 05/14/2016  . Enteritis 05/14/2016  . Meningioma ([place]) 05/01/2015  . 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 Diplopia 04/08/2015  . Leukopenia 04/08/2015  .  Bradycardia 02/17/2013  . Occlusion and stenosis of carotid artery without mention of cerebral infarction 02/17/2013     Health Maintenance Due  Topic Date Due  . TETANUS/TDAP  10/23/1951  . PNA vac Low Risk Adult (1 of 2 - PCV13) 10/22/1997     Review of Systems Per hpi, no other compalins Physical Exam   BP 104/66   Pulse 62   Temp 97.5 F (36.4 C) (Oral)   Ht 5\' 3"  (1.6 m)   Wt 100 lb (45.4 kg)   BMI 17.71 kg/m    Physical Exam  Constitutional:  oriented to person, place, and time. appears well-developed and well-nourished. No distress.  HENT: [place]/AT, PERRLA, no scleral icterus Mouth/Throat: Oropharynx is clear and moist. No oropharyngeal exudate.  Abdominal: Soft. Bowel sounds are normal.  exhibits no distension. There is no tenderness.  Psychiatric: a normal mood and affect.  behavior is normal.   Lab Results  Component Value Date   LABRPR Non Reactive 05/01/2015    CBC Lab Results  Component Value Date   WBC 9.1 07/29/2016   RBC 3.58 (L) 07/29/2016   HGB 10.7 (L) 07/29/2016   HCT 32.2 (L) 07/29/2016   PLT 215 07/29/2016   MCV 89.9 07/29/2016   MCH 29.9 07/29/2016   MCHC 33.2 07/29/2016   RDW 13.5 07/29/2016   LYMPHSABS 0.4 (L) 07/29/2016   MONOABS 0.4 07/29/2016   EOSABS 0.0 07/29/2016    BMET Lab Results  Component  Value Date   NA 135 07/29/2016   K 3.2 (L) 07/29/2016   CL 106 07/29/2016   CO2 21 (L) 07/29/2016   GLUCOSE 105 (H) 07/29/2016   BUN 8 07/29/2016   CREATININE 0.70 07/29/2016   CALCIUM 8.5 (L) 07/29/2016   GFRNONAA >60 07/29/2016   GFRAA >60 07/29/2016      Assessment and Plan  Recurrent cdifficile = continue with prolonged oral taper. Will meet up in 2-3 months to see how she is doing and the need to move towards FMT. They are in support of this plan. She has stool kits at home if needed

## 2016-10-02 ENCOUNTER — Encounter (HOSPITAL_COMMUNITY): Payer: Self-pay | Admitting: Neurology

## 2016-10-02 ENCOUNTER — Emergency Department (HOSPITAL_COMMUNITY): Payer: Medicare Other

## 2016-10-02 ENCOUNTER — Emergency Department (HOSPITAL_COMMUNITY)
Admission: EM | Admit: 2016-10-02 | Discharge: 2016-10-02 | Disposition: A | Discharge status: Home / Self Care | Payer: Medicare Other | Attending: Emergency Medicine | Admitting: Emergency Medicine

## 2016-10-02 DIAGNOSIS — Z7982 Long term (current) use of aspirin: Secondary | ICD-10-CM | POA: Diagnosis not present

## 2016-10-02 DIAGNOSIS — Z8673 Personal history of transient ischemic attack (TIA), and cerebral infarction without residual deficits: Secondary | ICD-10-CM | POA: Diagnosis not present

## 2016-10-02 DIAGNOSIS — E7800 Pure hypercholesterolemia, unspecified: Secondary | ICD-10-CM | POA: Insufficient documentation

## 2016-10-02 DIAGNOSIS — R42 Dizziness and giddiness: Secondary | ICD-10-CM | POA: Diagnosis present

## 2016-10-02 DIAGNOSIS — Z79899 Other long term (current) drug therapy: Secondary | ICD-10-CM | POA: Insufficient documentation

## 2016-10-02 DIAGNOSIS — H81399 Other peripheral vertigo, unspecified ear: Secondary | ICD-10-CM | POA: Insufficient documentation

## 2016-10-02 DIAGNOSIS — I10 Essential (primary) hypertension: Secondary | ICD-10-CM | POA: Insufficient documentation

## 2016-10-02 LAB — URINALYSIS, ROUTINE W REFLEX MICROSCOPIC
BILIRUBIN URINE: NEGATIVE
GLUCOSE, UA: NEGATIVE mg/dL
KETONES UR: NEGATIVE mg/dL
LEUKOCYTES UA: NEGATIVE
Nitrite: NEGATIVE
PH: 5 (ref 5.0–8.0)
Protein, ur: NEGATIVE mg/dL
RBC / HPF: NONE SEEN RBC/hpf (ref 0–5)
Specific Gravity, Urine: 1.002 — ABNORMAL LOW (ref 1.005–1.030)
Squamous Epithelial / LPF: NONE SEEN

## 2016-10-02 LAB — BASIC METABOLIC PANEL
Anion gap: 6 (ref 5–15)
BUN: 8 mg/dL (ref 6–20)
CALCIUM: 9.1 mg/dL (ref 8.9–10.3)
CHLORIDE: 106 mmol/L (ref 101–111)
CO2: 23 mmol/L (ref 22–32)
CREATININE: 0.6 mg/dL (ref 0.44–1.00)
GFR calc non Af Amer: >60 mL/min (ref >60)
Glucose, Bld: 89 mg/dL (ref 65–99)
Potassium: 3.6 mmol/L (ref 3.5–5.1)
SODIUM: 135 mmol/L (ref 135–145)

## 2016-10-02 LAB — CBC
HCT: 34.6 % — ABNORMAL LOW (ref 36.0–46.0)
Hemoglobin: 11.2 g/dL — ABNORMAL LOW (ref 12.0–15.0)
MCH: 29.2 pg (ref 26.0–34.0)
MCHC: 32.4 g/dL (ref 30.0–36.0)
MCV: 90.3 fL (ref 78.0–100.0)
PLATELETS: 208 10*3/uL (ref 150–400)
RBC: 3.83 MIL/uL — AB (ref 3.87–5.11)
RDW: 13.4 % (ref 11.5–15.5)
WBC: 3.2 10*3/uL — AB (ref 4.0–10.5)

## 2016-10-02 MED ORDER — ONDANSETRON 8 MG PO TBDP: 8.0000 mg | ORAL_TABLET | Freq: Three times a day (TID) | ORAL | 0 refills | Status: DC | PRN

## 2016-10-02 NOTE — Discharge Instructions (Signed)
The MRI did not show any signs of stroke or acute abnormality. It's possible vertigo is related to some inner ear problems. Continue your current medications. Follow-up with your primary doctor if the symptoms did not improve over the next few days

## 2016-10-02 NOTE — ED Notes (Signed)
PT assisted to restroom via wheelchair. PT returns to room and placed back on monitor. No requests at this time. Waiting for MRI

## 2016-10-02 NOTE — ED Notes (Signed)
PT remains in MRI.

## 2016-10-02 NOTE — ED Triage Notes (Addendum)
Pt is coming from PCP todaty c/o trouble with balance since yesteday, dizziness. This morning woke up with nausea, continued balance issues. Has hx of TIA, takes plavix, aspirin. Is being treated for C. Diff. Does report general weakness. Accompanied with daughter

## 2016-10-02 NOTE — ED Provider Notes (Signed)
[facility] DEPT Provider Note   CSN: [id number] Arrival date & time: 10/02/16  1039     History   Chief Complaint Chief Complaint  Patient presents with  . Dizziness    HPI Sydney [name] is a 83 y.o. female.  HPI Patient presents to the emergency room for evaluation of dizziness and trouble with her balance. Patient states that yesterday she started having some trouble with mild nausea without vomiting and a sensation of feeling off balance. She generally felt okay when she was still but when she stood tried to walk she felt like she was falling towards the left side. She also felt that maybe her left side was feeling a little weaker than the right.  She had a mild headache yesterday as well but that resolved She denies any trouble with her vision.   Today she went to her primary doctor's office. They noted that she seemed to be falling towards the side when walking.  The notes that the patient provided indicates she was falling to the right however the patient states she was falling to the left.  Patient was sent to the emergency room for further evaluation. Past Medical History:  Diagnosis Date  . Arthritis    "left hand" (02/17/2013)  . Glaucoma   . High cholesterol    "on RX years ago" (02/17/2013)  . Hypertension   . Meningioma ([place]) 05/01/2015  . Osteoporosis 02/2014   T score -2.5  followed by Dr. Lysle Rubens  . Palpitations    PACs, PVCs and short runs of atrial tachycardia on Holter monitoring  . TIA (transient ischemic attack) 1990's    Patient Active Problem List   Diagnosis Date Noted  . Hypokalemia 05/14/2016  . Anemia 05/14/2016  . Hyponatremia 05/14/2016  . Glaucoma 05/14/2016  . Enteritis 05/14/2016  . Meningioma ([place]) 05/01/2015  . 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 Diplopia 04/08/2015  . Leukopenia 04/08/2015  . Bradycardia 02/17/2013  . Occlusion and stenosis of carotid artery without mention of cerebral infarction 02/17/2013    Past Surgical  History:  Procedure Laterality Date  . ANAL FISTULECTOMY  1970  . TONSILLECTOMY      OB History    Gravida Para Term Preterm AB Living   1 1 1     1    SAB TAB Ectopic Multiple Live Births                   Home Medications    Prior to Admission medications   Medication Sig Start Date End Date Taking? Authorizing Provider  aspirin (ECOTRIN LOW STRENGTH) 81 MG EC tablet Take 81 mg by mouth daily. Swallow whole.   Yes [provider]  cholecalciferol (VITAMIN D) 1000 UNITS tablet Take 1,000 Units by mouth daily.    Yes [provider]  clopidogrel (PLAVIX) 75 MG tablet Take 75 mg by mouth daily.   Yes [provider]  fluticasone (FLONASE) 50 MCG/ACT nasal spray Place 2 sprays into both nostrils daily. Patient taking differently: Place 2 sprays into both nostrils daily as needed for allergies or rhinitis.  01/11/14  Yes Linton Flemings, MD  folic acid (FOLVITE) 1 MG tablet Take 1 mg by mouth daily.   Yes [provider]  latanoprost (XALATAN) 0.005 % ophthalmic solution Place 1 drop into both eyes at bedtime.    Yes [provider]  loratadine (CLARITIN) 10 MG tablet Take 10 mg by mouth daily as needed for allergies (for seasonal allergies).  Yes [provider]  meloxicam (MOBIC) 7.5 MG tablet  06/22/16  Yes [provider]  Saccharomyces boulardii (FLORASTOR PO) Take by mouth.   Yes [provider]  vancomycin (VANCOCIN) 50 mg/mL oral solution Take 125 mg by mouth every 6 (six) hours. 2 doses left of therapy   Yes [provider]  ibandronate (BONIVA) 150 MG tablet Take 150 mg by mouth every 30 (thirty) days. 05/05/16   [provider]  ondansetron (ZOFRAN ODT) 8 MG disintegrating tablet Take 1 tablet (8 mg total) by mouth every 8 (eight) hours as needed for nausea or vomiting. 10/02/16   Dorie Rank, MD  potassium chloride SA (K-DUR,KLOR-CON) 20 MEQ tablet Take 2 tablets (40 mEq total) by mouth  daily. Patient not taking: Reported on 09/24/2016 07/29/16   Macarthur Critchley, MD    Family History Family History  Problem Relation Age of Onset  . Hypertension Father   . Heart attack Father   . Stroke Brother   . Stroke Maternal Grandmother     Social History Social History  Substance Use Topics  . Smoking status: Never Smoker  . Smokeless tobacco: Never Used  . Alcohol use No     Allergies   Demerol [meperidine]; Codeine; Sulfa antibiotics; and Tomato   Review of Systems Review of Systems  All other systems reviewed and are negative.    Physical Exam Updated Vital Signs BP 135/64   Pulse (!) 51   Temp 97.5 F (36.4 C) (Oral)   Resp (!) 21   Ht 1.6 m (5\' 3" )   Wt 44.5 kg (98 lb)   SpO2 100%   BMI 17.36 kg/m   Physical Exam  Constitutional: She is oriented to person, place, and time. No distress.  HENT:  Head: Normocephalic and atraumatic.  Right Ear: External ear normal.  Left Ear: External ear normal.  Mouth/Throat: Oropharynx is clear and moist.  Eyes: Conjunctivae are normal. Right eye exhibits no discharge. Left eye exhibits no discharge. No scleral icterus.  Neck: Neck supple. No tracheal deviation present.  Cardiovascular: Normal rate, regular rhythm and intact distal pulses.   Pulmonary/Chest: Effort normal and breath sounds normal. No stridor. No respiratory distress. She has no wheezes. She has no rales.  Abdominal: Soft. Bowel sounds are normal. She exhibits no distension. There is no tenderness. There is no rebound and no guarding.  Musculoskeletal: She exhibits no edema or tenderness.  Neurological: She is alert and oriented to person, place, and time. She has normal strength. No cranial nerve deficit (No facial droop, extraocular movements intact, tongue midline ) or sensory deficit. She exhibits normal muscle tone. She displays no seizure activity. Coordination normal.  No pronator drift bilateral upper extrem, able to hold both legs off  bed for 5 seconds, sensation intact in all extremities, no visual field cuts, no left or right sided neglect, normal finger-nose exam bilaterally, no nystagmus noted   Skin: Skin is warm and dry. No rash noted. She is not diaphoretic.  Psychiatric: She has a normal mood and affect.  Nursing note and vitals reviewed.    ED Treatments / Results  Labs (all labs ordered are listed, but only abnormal results are displayed) Labs Reviewed  CBC - Abnormal; Notable for the following:       Result Value   WBC 3.2 (*)    RBC 3.83 (*)    Hemoglobin 11.2 (*)    HCT 34.6 (*)    All other components within normal limits  URINALYSIS, ROUTINE W REFLEX MICROSCOPIC - Abnormal; Notable for the following:    Color, Urine STRAW (*)    Specific Gravity, Urine 1.002 (*)    Hgb urine dipstick MODERATE (*)    Bacteria, UA RARE (*)    Non Squamous Epithelial 0-5 (*)    All other components within normal limits  BASIC METABOLIC PANEL    EKG  EKG Interpretation  Date/Time:  Friday October 02 2016 10:45:29 EDT Ventricular Rate:  66 PR Interval:  134 QRS Duration: 90 QT Interval:  386 QTC Calculation: 404 R Axis:   64 Text Interpretation:  Sinus rhythm with sinus arrhythmia with Premature ventricular complexes or Fusion complexes Nonspecific T wave abnormality Abnormal ECG No significant change since last tracing Confirmed by Dorie Rank [phone]) on 10/02/2016 10:54:15 AM Also confirmed by Dorie Rank [phone]), editor Verna Czech [phone])  on 10/02/2016 10:54:48 AM       Radiology Mr Brain Wo Contrast  Result Date: 10/02/2016 CLINICAL DATA:  83 year old female with dizziness, difficulty balancing, falling to the left. Undergoing treatment for Clostridium difficile colitis. EXAM: MRI HEAD WITHOUT CONTRAST TECHNIQUE: Multiplanar, multiecho pulse sequences of the brain and surrounding structures were obtained without intravenous contrast. COMPARISON:  Brain MRI and intracranial MRA 04/08/2015. FINDINGS: Brain:  Stable cerebral volume. No restricted diffusion to suggest acute infarction. No midline shift, ventriculomegaly, or acute intracranial hemorrhage. Cervicomedullary junction and pituitary are within normal limits. Chronic 16-17 mm superior left parafalcine meningioma is stable since 2016. No significant mass effect on the left superior frontal gyrus (series 2, image 13 and series 10, image 12). No associated cerebral edema. Stable gray-white matter differentiation throughout the brain. Patchy periatrial and other scattered subcortical white matter T2 and FLAIR hyperintensity is nonspecific but stable since 2016 no cortical encephalomalacia. There is a small chronic subcortical white matter microhemorrhage in the right parietal lobe which may be new since 2016 (series 8, image 65). No other chronic cerebral blood products. Vascular: Major intracranial vascular flow voids are stable. Skull and upper cervical spine: Negative. Normal bone marrow signal. Sinuses/Orbits: Stable an negative. Other: Mild right mastoid effusion has nearly resolved since 2016. Visible internal auditory structures appear normal. Negative scalp soft tissues. IMPRESSION: 1.  No acute intracranial abnormality. 2. Essentially stable MRI appearance of the brain since 2016, including a small 17 mm left parafalcine meningioma which appears inconsequential. 3. Largely resolved right mastoid effusion since 2016 which was likely postinflammatory. Electronically Signed   By: Genevie Ann M.D.   On: 10/02/2016 16:06    Procedures Procedures (including critical care time)  Medications Ordered in ED Medications - No data to display   Initial Impression / Assessment and Plan / ED Course  I have reviewed the triage vital signs and the nursing notes.  Pertinent labs & imaging results that were available during my care of the patient were reviewed by me and considered in my medical decision making (see chart for details).   patient presented to the  emergency room with complaints of dizziness. Is concerned about the possibility of stroke considering her age and her symptoms. We proceeded with MRI in the emergency room. Fortunately there is no evidence of any stroke or other acute abnormality. Her laboratory tests are reassuring. I doubt acute infection, anemia or acute kidney injury. Her symptoms are related to peripheral vertigo. We discussed outpatient follow-up with her primary care doctor Final Clinical Impressions(s) / ED Diagnoses   Final diagnoses:  Vertigo    New Prescriptions New Prescriptions  ONDANSETRON (ZOFRAN ODT) 8 MG DISINTEGRATING TABLET    Take 1 tablet (8 mg total) by mouth every 8 (eight) hours as needed for nausea or vomiting.     Dorie Rank, MD 10/02/16 [phone]

## 2016-10-05 ENCOUNTER — Encounter (HOSPITAL_COMMUNITY): Payer: Self-pay | Admitting: *Deleted

## 2016-10-05 ENCOUNTER — Observation Stay (HOSPITAL_COMMUNITY)
Admission: EM | Admit: 2016-10-05 | Discharge: 2016-10-06 | Disposition: A | Discharge status: Home / Self Care | Payer: Medicare Other | Attending: Internal Medicine | Admitting: Internal Medicine

## 2016-10-05 DIAGNOSIS — A0471 Enterocolitis due to Clostridium difficile, recurrent: Secondary | ICD-10-CM

## 2016-10-05 DIAGNOSIS — Z7902 Long term (current) use of antithrombotics/antiplatelets: Secondary | ICD-10-CM | POA: Diagnosis not present

## 2016-10-05 DIAGNOSIS — E785 Hyperlipidemia, unspecified: Secondary | ICD-10-CM | POA: Diagnosis not present

## 2016-10-05 DIAGNOSIS — H409 Unspecified glaucoma: Secondary | ICD-10-CM | POA: Insufficient documentation

## 2016-10-05 DIAGNOSIS — I10 Essential (primary) hypertension: Secondary | ICD-10-CM | POA: Diagnosis not present

## 2016-10-05 DIAGNOSIS — E871 Hypo-osmolality and hyponatremia: Secondary | ICD-10-CM | POA: Diagnosis present

## 2016-10-05 DIAGNOSIS — M79662 Pain in left lower leg: Secondary | ICD-10-CM | POA: Diagnosis not present

## 2016-10-05 DIAGNOSIS — Z8673 Personal history of transient ischemic attack (TIA), and cerebral infarction without residual deficits: Secondary | ICD-10-CM

## 2016-10-05 DIAGNOSIS — Z882 Allergy status to sulfonamides status: Secondary | ICD-10-CM | POA: Diagnosis not present

## 2016-10-05 DIAGNOSIS — R2681 Unsteadiness on feet: Secondary | ICD-10-CM | POA: Insufficient documentation

## 2016-10-05 DIAGNOSIS — Z885 Allergy status to narcotic agent status: Secondary | ICD-10-CM | POA: Diagnosis not present

## 2016-10-05 DIAGNOSIS — R2689 Other abnormalities of gait and mobility: Secondary | ICD-10-CM | POA: Diagnosis not present

## 2016-10-05 DIAGNOSIS — Z79899 Other long term (current) drug therapy: Secondary | ICD-10-CM | POA: Diagnosis not present

## 2016-10-05 DIAGNOSIS — Z91018 Allergy to other foods: Secondary | ICD-10-CM | POA: Insufficient documentation

## 2016-10-05 DIAGNOSIS — A0472 Enterocolitis due to Clostridium difficile, not specified as recurrent: Secondary | ICD-10-CM | POA: Diagnosis present

## 2016-10-05 DIAGNOSIS — E876 Hypokalemia: Secondary | ICD-10-CM | POA: Diagnosis not present

## 2016-10-05 DIAGNOSIS — M810 Age-related osteoporosis without current pathological fracture: Secondary | ICD-10-CM | POA: Insufficient documentation

## 2016-10-05 DIAGNOSIS — D649 Anemia, unspecified: Secondary | ICD-10-CM | POA: Diagnosis not present

## 2016-10-05 DIAGNOSIS — Z86011 Personal history of benign neoplasm of the brain: Secondary | ICD-10-CM | POA: Insufficient documentation

## 2016-10-05 DIAGNOSIS — R5381 Other malaise: Secondary | ICD-10-CM | POA: Diagnosis not present

## 2016-10-05 DIAGNOSIS — R195 Other fecal abnormalities: Secondary | ICD-10-CM | POA: Insufficient documentation

## 2016-10-05 DIAGNOSIS — Z8249 Family history of ischemic heart disease and other diseases of the circulatory system: Secondary | ICD-10-CM | POA: Diagnosis not present

## 2016-10-05 DIAGNOSIS — Z7982 Long term (current) use of aspirin: Secondary | ICD-10-CM | POA: Diagnosis not present

## 2016-10-05 DIAGNOSIS — E860 Dehydration: Secondary | ICD-10-CM

## 2016-10-05 DIAGNOSIS — R197 Diarrhea, unspecified: Secondary | ICD-10-CM | POA: Diagnosis not present

## 2016-10-05 LAB — CBC
HCT: 36.3 % (ref 36.0–46.0)
Hemoglobin: 12.2 g/dL (ref 12.0–15.0)
MCH: 30.3 pg (ref 26.0–34.0)
MCHC: 33.6 g/dL (ref 30.0–36.0)
MCV: 90.1 fL (ref 78.0–100.0)
PLATELETS: 220 10*3/uL (ref 150–400)
RBC: 4.03 MIL/uL (ref 3.87–5.11)
RDW: 13.1 % (ref 11.5–15.5)
WBC: 4 10*3/uL (ref 4.0–10.5)

## 2016-10-05 LAB — URINALYSIS, ROUTINE W REFLEX MICROSCOPIC
BILIRUBIN URINE: NEGATIVE
GLUCOSE, UA: NEGATIVE mg/dL
Ketones, ur: NEGATIVE mg/dL
LEUKOCYTES UA: NEGATIVE
NITRITE: NEGATIVE
PH: 6 (ref 5.0–8.0)
Protein, ur: NEGATIVE mg/dL
SPECIFIC GRAVITY, URINE: 1.008 (ref 1.005–1.030)

## 2016-10-05 LAB — COMPREHENSIVE METABOLIC PANEL
ALK PHOS: 74 U/L (ref 38–126)
ALT: 16 U/L (ref 14–54)
AST: 26 U/L (ref 15–41)
Albumin: 3.2 g/dL — ABNORMAL LOW (ref 3.5–5.0)
Anion gap: 6 (ref 5–15)
BILIRUBIN TOTAL: 0.7 mg/dL (ref 0.3–1.2)
BUN: 8 mg/dL (ref 6–20)
CALCIUM: 9 mg/dL (ref 8.9–10.3)
CO2: 20 mmol/L — ABNORMAL LOW (ref 22–32)
CREATININE: 0.77 mg/dL (ref 0.44–1.00)
Chloride: 103 mmol/L (ref 101–111)
Glucose, Bld: 80 mg/dL (ref 65–99)
Potassium: 3.2 mmol/L — ABNORMAL LOW (ref 3.5–5.1)
Sodium: 129 mmol/L — ABNORMAL LOW (ref 135–145)
TOTAL PROTEIN: 6.8 g/dL (ref 6.5–8.1)

## 2016-10-05 LAB — C DIFFICILE QUICK SCREEN W PCR REFLEX
C DIFFICILE (CDIFF) TOXIN: NEGATIVE
C Diff antigen: NEGATIVE
C Diff interpretation: NOT DETECTED

## 2016-10-05 LAB — LIPASE, BLOOD: LIPASE: 44 U/L (ref 11–51)

## 2016-10-05 MED ORDER — ONDANSETRON HCL 4 MG PO TABS: 4.0000 mg | ORAL_TABLET | Freq: Four times a day (QID) | ORAL | Status: DC | PRN

## 2016-10-05 MED ORDER — LORATADINE 10 MG PO TABS: 10.0000 mg | ORAL_TABLET | Freq: Every day | ORAL | Status: DC | PRN

## 2016-10-05 MED ORDER — ENSURE ENLIVE PO LIQD
237.0000 mL | Freq: Two times a day (BID) | ORAL | Status: DC
  Administered 2016-10-06: 237 mL via ORAL

## 2016-10-05 MED ORDER — ONDANSETRON HCL 4 MG/2ML IJ SOLN: 4.0000 mg | Freq: Once | INTRAMUSCULAR | Status: DC

## 2016-10-05 MED ORDER — ASPIRIN EC 81 MG PO TBEC
81.0000 mg | DELAYED_RELEASE_TABLET | Freq: Every day | ORAL | Status: DC
  Administered 2016-10-05 – 2016-10-06 (×2): 81 mg via ORAL
  Filled 2016-10-05 (×2): qty 1

## 2016-10-05 MED ORDER — SODIUM CHLORIDE 0.9 % IV SOLN
INTRAVENOUS | Status: DC
  Administered 2016-10-05: 18:00:00 via INTRAVENOUS

## 2016-10-05 MED ORDER — ACETAMINOPHEN 650 MG RE SUPP: 650.0000 mg | Freq: Four times a day (QID) | RECTAL | Status: DC | PRN

## 2016-10-05 MED ORDER — CLOPIDOGREL BISULFATE 75 MG PO TABS
75.0000 mg | ORAL_TABLET | Freq: Every day | ORAL | Status: DC
  Administered 2016-10-05 – 2016-10-06 (×2): 75 mg via ORAL
  Filled 2016-10-05 (×2): qty 1

## 2016-10-05 MED ORDER — SODIUM CHLORIDE 0.9 % IV BOLUS (SEPSIS)
1000.0000 mL | Freq: Once | INTRAVENOUS | Status: AC
  Administered 2016-10-05: 1000 mL via INTRAVENOUS

## 2016-10-05 MED ORDER — ACETAMINOPHEN 325 MG PO TABS: 650.0000 mg | ORAL_TABLET | Freq: Four times a day (QID) | ORAL | Status: DC | PRN

## 2016-10-05 MED ORDER — ENOXAPARIN SODIUM 30 MG/0.3ML ~~LOC~~ SOLN
30.0000 mg | SUBCUTANEOUS | Status: DC
  Administered 2016-10-05: 30 mg via SUBCUTANEOUS
  Filled 2016-10-05: qty 0.3

## 2016-10-05 MED ORDER — LATANOPROST 0.005 % OP SOLN
1.0000 [drp] | Freq: Every day | OPHTHALMIC | Status: DC
  Administered 2016-10-05: 1 [drp] via OPHTHALMIC
  Filled 2016-10-05: qty 2.5

## 2016-10-05 MED ORDER — FLUTICASONE PROPIONATE 50 MCG/ACT NA SUSP
2.0000 | Freq: Every day | NASAL | Status: DC | PRN
  Filled 2016-10-05: qty 16

## 2016-10-05 MED ORDER — VANCOMYCIN HCL 125 MG PO CAPS: 125.0000 mg | ORAL_CAPSULE | Freq: Four times a day (QID) | ORAL | Status: DC

## 2016-10-05 MED ORDER — SACCHAROMYCES BOULARDII 250 MG PO CAPS
250.0000 mg | ORAL_CAPSULE | Freq: Two times a day (BID) | ORAL | Status: DC
  Administered 2016-10-05 – 2016-10-06 (×2): 250 mg via ORAL
  Filled 2016-10-05 (×2): qty 1

## 2016-10-05 MED ORDER — ONDANSETRON HCL 4 MG/2ML IJ SOLN: 4.0000 mg | Freq: Four times a day (QID) | INTRAMUSCULAR | Status: DC | PRN

## 2016-10-05 NOTE — ED Notes (Signed)
Admitting at bedside 

## 2016-10-05 NOTE — Progress Notes (Signed)
Sydney [name] is a 83 y.o. female patient admitted from ED awake, alert - oriented  X 4 - no acute distress noted.  VSS - Blood pressure 129/76, pulse 73, temperature 98 F (36.7 C), temperature source Oral, resp. rate 18, height 5\' 3"  (1.6 m), weight 43.7 kg (96 lb 6.4 oz), SpO2 100 %.    IV in place, occlusive dsg intact without redness.  Orientation to room, and floor completed with information packet given to patient/family.  Patient declined safety video at this time.  Admission INP armband ID verified with patient/family, and in place.   SR up x 2, fall assessment complete, with patient and family able to verbalize understanding of risk associated with falls, and verbalized understanding to call nsg before up out of bed.  Call light within reach, patient able to voice, and demonstrate understanding.  Skin, clean-dry- intact without evidence of bruising, or skin tears.   No evidence of skin break down noted on exam.     Will cont to eval and treat per MD orders.  Celine Ahr, RN 10/05/2016 3:36 PM

## 2016-10-05 NOTE — ED Triage Notes (Signed)
Patient was seen in the ED on Fri for unsteady gait and had MRI was to follow up with her PCP today however started having diarrhea yest with weakness.

## 2016-10-05 NOTE — H&P (Signed)
History and Physical    Sydney [name] NLG:[id number] DOB: 02-23-1931 DOA: 10/05/2016  PCP: [name], MD Patient coming from: home  Chief Complaint: diarrhea, balance problems, weak  HPI: Sydney [name] is a 83 y.o. female with medical history significant of glaucoma, hyperlipidemia, hypertension, meningioma, palpitations, TIA, C. Difficile.    Patient diagnosed with C. difficile on 05/14/2016 and started on oral intake. Since that time she has received several rounds of oral vancomycin since that time to recurrence of symptoms. Patient recently had been tapered down on her vancomycin dosing to every third day. Diarrhea, abdominal pain returned at approximately 00:00 the night prior to admission. Patient discussed symptoms with her ID physician, Dr. Graylon Good who recommended patient come to the emergency room for evaluation and possible admission. Of note patient is also been struggling with what she terms as "balance "space problems since 10/02/2016. Patient was seen in the emergency room and had a negative stroke/TIA workup at that time. Patient endorses the symptoms are worse when initially going from lying or sitting to standing with ambulation but improves if she continues to ambulate. Denies any vertigo or worsening of symptoms with head movement. Denies any recent URI or allergic rhinitis symptoms. Patient has become progressively more weak over the last several days and is having a difficult time caring for herself at this time. History is provided by patient and patient's daughter who provides a great deal of care for her. Denies dysuria, frequency, chest pain, shortness of breath, palpitations, focal neurological deficit, headache, LOC.  ED Course: IVF and zofran provided  Review of Systems: As per HPI otherwise all other systems reviewed and are negative  Ambulatory Status: limited due to sx outlined above  Past Medical History:  Diagnosis Date  . Arthritis    "left hand"  (02/17/2013)  . Glaucoma   . High cholesterol    "on RX years ago" (02/17/2013)  . Hypertension   . Meningioma ([place]) 05/01/2015  . Osteoporosis 02/2014   T score -2.5  followed by Dr. Lysle Rubens  . Palpitations    PACs, PVCs and short runs of atrial tachycardia on Holter monitoring  . TIA (transient ischemic attack) 1990's    Past Surgical History:  Procedure Laterality Date  . ANAL FISTULECTOMY  1970  . TONSILLECTOMY      Social History   Social History  . Marital status: Widowed    Spouse name: N/A  . Number of children: 1  . Years of education: 107   Occupational History  . retired    Social History Main Topics  . Smoking status: Never Smoker  . Smokeless tobacco: Never Used  . Alcohol use No  . Drug use: No  . Sexual activity: No     Comment: 1st intercourse 83 yo-Fewer than 5 partners   Other Topics Concern  . Not on file   Social History Narrative   Patient does not drink caffeine.   Patient is right handed.     Allergies  Allergen Reactions  . Demerol [Meperidine] Other (See Comments)    Excessive sweating, cramping  . Codeine Rash  . Sulfa Antibiotics Other (See Comments)    Reaction unknown  . Tomato Itching    Family History  Problem Relation Age of Onset  . Hypertension Father   . Heart attack Father   . Stroke Brother   . Stroke Maternal Grandmother     Prior to Admission medications   Medication Sig Start Date End Date Taking? Authorizing Provider  aspirin (ECOTRIN LOW STRENGTH) 81 MG EC tablet Take 81 mg by mouth daily. Swallow whole.   Yes [provider]  cholecalciferol (VITAMIN D) 1000 UNITS tablet Take 1,000 Units by mouth daily.    Yes [provider]  clopidogrel (PLAVIX) 75 MG tablet Take 75 mg by mouth daily.   Yes [provider]  folic acid (FOLVITE) 1 MG tablet Take 1 mg by mouth daily.   Yes [provider]  latanoprost (XALATAN) 0.005 % ophthalmic solution Place 1 drop into both eyes at  bedtime.    Yes [provider]  Saccharomyces boulardii (FLORASTOR PO) Take by mouth.   Yes [provider]  vancomycin (VANCOCIN) 125 MG capsule Take 125 mg by mouth See admin instructions. Taken once a day every three days. yesterday was the 9/10 dose. today was last dose (10/10)   Yes [provider]  vancomycin (VANCOCIN) 50 mg/mL oral solution Take 125 mg by mouth every 6 (six) hours. 2 doses left of therapy   Yes [provider]  fluticasone (FLONASE) 50 MCG/ACT nasal spray Place 2 sprays into both nostrils daily. Patient taking differently: Place 2 sprays into both nostrils daily as needed for allergies or rhinitis.  01/11/14   Linton Flemings, MD  loratadine (CLARITIN) 10 MG tablet Take 10 mg by mouth daily as needed for allergies (for seasonal allergies).    [provider]  meloxicam (MOBIC) 7.5 MG tablet  06/22/16   [provider]  ondansetron (ZOFRAN ODT) 8 MG disintegrating tablet Take 1 tablet (8 mg total) by mouth every 8 (eight) hours as needed for nausea or vomiting. Patient not taking: Reported on 10/05/2016 10/02/16   Dorie Rank, MD  potassium chloride SA (K-DUR,KLOR-CON) 20 MEQ tablet Take 2 tablets (40 mEq total) by mouth daily. Patient not taking: Reported on 09/24/2016 07/29/16   Macarthur Critchley, MD    Physical Exam: Vitals:   10/05/16 1345 10/05/16 1400 10/05/16 1415 10/05/16 1524  BP: (!) 112/59 120/62 (!) 114/55 129/76  Pulse: 77 62 63 73  Resp: 19 18 15 18   Temp:    98 F (36.7 C)  TempSrc:    Oral  SpO2: 100% 100% 100% 100%  Weight:    43.7 kg (96 lb 6.4 oz)  Height:    5\' 3"  (1.6 m)     General: frail and cachectic appearing, resting comfortably in bed.  Eyes:  PERRL, EOMI, normal lids, iris ENT:  grossly normal hearing, lips & tongue, mmm Neck:  no LAD, masses or thyromegaly Cardiovascular:  RRR, no m/r/g. No LE edema.  Respiratory:  CTA bilaterally, no w/r/r. Normal respiratory effort. Abdomen:  hyperactive BS, soft, nondistended Skin:  no rash or induration seen on limited exam Musculoskeletal:  grossly normal tone BUE/BLE, good ROM, no bony abnormality Psychiatric:  grossly normal mood and affect, speech fluent and appropriate, AOx3 Neurologic:  CN 2-12 grossly intact, moves all extremities in coordinated fashion, sensation intact  Labs on Admission: I have personally reviewed following labs and imaging studies  CBC:  Recent Labs Lab 10/02/16 1218 10/05/16 0935  WBC 3.2* 4.0  HGB 11.2* 12.2  HCT 34.6* 36.3  MCV 90.3 90.1  PLT 208 322   Basic Metabolic Panel:  Recent Labs Lab 10/02/16 1218 10/05/16 0935  NA 135 129*  K 3.6 3.2*  CL 106 103  CO2 23 20*  GLUCOSE 89 80  BUN 8 8  CREATININE 0.60 0.77  CALCIUM 9.1 9.0   GFR: Estimated  Creatinine Clearance: 36.8 mL/min (by C-G formula based on SCr of 0.77 mg/dL). Liver Function Tests:  Recent Labs Lab 10/05/16 0935  AST 26  ALT 16  ALKPHOS 74  BILITOT 0.7  PROT 6.8  ALBUMIN 3.2*    Recent Labs Lab 10/05/16 0935  LIPASE 44   No results for input(s): AMMONIA in the last 168 hours. Coagulation Profile: No results for input(s): INR, PROTIME in the last 168 hours. Cardiac Enzymes: No results for input(s): CKTOTAL, CKMB, CKMBINDEX, TROPONINI in the last 168 hours. BNP (last 3 results) No results for input(s): PROBNP in the last 8760 hours. HbA1C: No results for input(s): HGBA1C in the last 72 hours. CBG: No results for input(s): GLUCAP in the last 168 hours. Lipid Profile: No results for input(s): CHOL, HDL, LDLCALC, TRIG, CHOLHDL, LDLDIRECT in the last 72 hours. Thyroid Function Tests: No results for input(s): TSH, T4TOTAL, FREET4, T3FREE, THYROIDAB in the last 72 hours. Anemia Panel: No results for input(s): VITAMINB12, FOLATE, FERRITIN, TIBC, IRON, RETICCTPCT in the last 72 hours. Urine analysis:    Component Value Date/Time   COLORURINE YELLOW 10/05/2016 1201   APPEARANCEUR CLEAR  10/05/2016 1201   LABSPEC 1.008 10/05/2016 1201   PHURINE 6.0 10/05/2016 1201   GLUCOSEU NEGATIVE 10/05/2016 1201   HGBUR SMALL (A) 10/05/2016 1201   BILIRUBINUR NEGATIVE 10/05/2016 1201   KETONESUR NEGATIVE 10/05/2016 1201   PROTEINUR NEGATIVE 10/05/2016 1201   UROBILINOGEN 0.2 27-Jun-202014 1048   NITRITE NEGATIVE 10/05/2016 1201   LEUKOCYTESUR NEGATIVE 10/05/2016 1201    Creatinine Clearance: Estimated Creatinine Clearance: 36.8 mL/min (by C-G formula based on SCr of 0.77 mg/dL).  Sepsis Labs: @LABRCNTIP (procalcitonin:4,lacticidven:4) ) Recent Results (from the past 240 hour(s))  C difficile quick scan w PCR reflex     Status: None   Collection Time: 10/05/16 12:47 PM  Result Value Ref Range Status   C Diff antigen NEGATIVE NEGATIVE Final   C Diff toxin NEGATIVE NEGATIVE Final   C Diff interpretation No C. difficile detected.  Final     Radiological Exams on Admission: No results found.   Assessment/Plan Active Problems:   Hyponatremia   C. difficile colitis   Physical deconditioning   Recurrent Clostridium difficile diarrhea   Balance problem   History of transient ischemic attack (TIA)   Recurrent C.diff: ongoing issue for pt since 05/24/16. Followed by ID in outpt setting. Pt was near the end of her taper and was taking vanc every 3rd day when sx returned.  - Resume oral Vanc QID - f/u ID recs - will likely need fecal transplant.   Balance: pt description of sx sounds more like orthostatic hypotension as opposed to vestibular neuritis or labrynthitis or BPV or intracranial process such as TIA. Sx improve after getting up and ambulating for some distance. Asymptomatic when resting and w/ head movement. No recent URI/allergic sx. Workup for TIA after onset of sx on 6/8 including MRI (stable meningioma and resolving mastoid effusion). Current dehydration and hyponatremia likely contributing though not causative. Pts overall physical deconditioned state from months of  intermittent illness are likely also contributing. Cerumen impaction noted on admission so eustachian tube dysfunction w/ associated effusion cannot be ruled out at this time - PT/OT - Orthostatic VS - Cerumen disimpaction - repeat otoscopic examination after cleaning - Pre-albumin - Ensure - consider further workup if not improving prior to DC.   Hyponatremia: likely from GI loss from Cdiff. 129 on admission.  - IVF - BMET in am  HypoKalemia: 3.2 on  admission.  - Kdur - Mag  TIA/Carotid  Disease: ??? Last carotid dopplers from 2016 showing bilateral 1-39% stenosis. - continue ASA/Plavix (consider DC plavix as pt apparently has been on since 2014 w/o clear indication. Defer to PCP in outpt setting)    DVT prophylaxis: Lovenox  Code Status: full  Family Communication: daughter  Disposition Plan: pending improvement in condition and solid plan for treating ongoing Cdiff.   Consults called: ID  Admission status: observation    Lasheba Stevens J MD [facility]  If 7PM-7AM, please contact night-coverage [email or web address] Password [email or web address]  10/05/2016, 4:30 PM

## 2016-10-05 NOTE — ED Provider Notes (Signed)
[facility] DEPT Provider Note   CSN: [id number] Arrival date & time: 10/05/16  0854     History   Chief Complaint Chief Complaint  Patient presents with  . Diarrhea    HPI Sydney [name] is a 83 y.o. female.  Pt presents to the ED with diarrhea.  The pt has been dealing with diarrhea and recurrent c.diff for months.  She did see [name] (ID) who has put pt on a long term taper of vancomycin.  She was down to every third day.   There was discussion of potential fecal transplant.  She last saw her on 5/31 and things were good then.  Pt developed some weakness on 6/8 and was sent to the ED for a possible stroke.  Her stroke work up was ok, so she was sent home.  Pt started with multiple episodes of diarrhea around midnight.  She has some abdominal pain as well.  Family called [name] who directed pt to the ED.      Past Medical History:  Diagnosis Date  . Arthritis    "left hand" (02/17/2013)  . Glaucoma   . High cholesterol    "on RX years ago" (02/17/2013)  . Hypertension   . Meningioma ([place]) 05/01/2015  . Osteoporosis 02/2014   T score -2.5  followed by Dr. Lysle Rubens  . Palpitations    PACs, PVCs and short runs of atrial tachycardia on Holter monitoring  . TIA (transient ischemic attack) 1990's    Patient Active Problem List   Diagnosis Date Noted  . C. difficile colitis 10/05/2016  . Hypokalemia 05/14/2016  . Anemia 05/14/2016  . Hyponatremia 05/14/2016  . Glaucoma 05/14/2016  . Enteritis 05/14/2016  . Meningioma ([place]) 05/01/2015  . 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 Diplopia 04/08/2015  . Leukopenia 04/08/2015  . Bradycardia 02/17/2013  . Occlusion and stenosis of carotid artery without mention of cerebral infarction 02/17/2013    Past Surgical History:  Procedure Laterality Date  . ANAL FISTULECTOMY  1970  . TONSILLECTOMY      OB History    Gravida Para Term Preterm AB Living   1 1 1     1    SAB TAB Ectopic Multiple Live Births         Home Medications    Prior to Admission medications   Medication Sig Start Date End Date Taking? Authorizing Provider  aspirin (ECOTRIN LOW STRENGTH) 81 MG EC tablet Take 81 mg by mouth daily. Swallow whole.    [provider]  cholecalciferol (VITAMIN D) 1000 UNITS tablet Take 1,000 Units by mouth daily.     [provider]  clopidogrel (PLAVIX) 75 MG tablet Take 75 mg by mouth daily.    [provider]  fluticasone (FLONASE) 50 MCG/ACT nasal spray Place 2 sprays into both nostrils daily. Patient taking differently: Place 2 sprays into both nostrils daily as needed for allergies or rhinitis.  01/11/14   Linton Flemings, MD  folic acid (FOLVITE) 1 MG tablet Take 1 mg by mouth daily.    [provider]  ibandronate (BONIVA) 150 MG tablet Take 150 mg by mouth every 30 (thirty) days. 05/05/16   [provider]  latanoprost (XALATAN) 0.005 % ophthalmic solution Place 1 drop into both eyes at bedtime.     [provider]  loratadine (CLARITIN) 10 MG tablet Take 10 mg by mouth daily as needed for allergies (for seasonal allergies).    [provider]  meloxicam (MOBIC) 7.5  MG tablet  06/22/16   [provider]  ondansetron (ZOFRAN ODT) 8 MG disintegrating tablet Take 1 tablet (8 mg total) by mouth every 8 (eight) hours as needed for nausea or vomiting. 10/02/16   Dorie Rank, MD  potassium chloride SA (K-DUR,KLOR-CON) 20 MEQ tablet Take 2 tablets (40 mEq total) by mouth daily. Patient not taking: Reported on 09/24/2016 07/29/16   Mackuen, Courteney Lyn, MD  Saccharomyces boulardii (FLORASTOR PO) Take by mouth.    [provider]  vancomycin (VANCOCIN) 50 mg/mL oral solution Take 125 mg by mouth every 6 (six) hours. 2 doses left of therapy    [provider]    Family History Family History  Problem Relation Age of Onset  . Hypertension Father   . Heart attack Father   . Stroke Brother   . Stroke Maternal  Grandmother     Social History Social History  Substance Use Topics  . Smoking status: Never Smoker  . Smokeless tobacco: Never Used  . Alcohol use No     Allergies   Demerol [meperidine]; Codeine; Sulfa antibiotics; and Tomato   Review of Systems Review of Systems  Gastrointestinal: Positive for abdominal pain and diarrhea.  All other systems reviewed and are negative.    Physical Exam Updated Vital Signs BP 124/60   Pulse 65   Temp 98.4 F (36.9 C)   Resp 19   SpO2 100%   Physical Exam  Constitutional: She is oriented to person, place, and time. She appears well-developed and well-nourished.  HENT:  Head: Normocephalic and atraumatic.  Right Ear: External ear normal.  Left Ear: External ear normal.  Nose: Nose normal.  Mouth/Throat: Mucous membranes are dry.  Eyes: Conjunctivae and EOM are normal. Pupils are equal, round, and reactive to light.  Neck: Normal range of motion. Neck supple.  Cardiovascular: Normal rate, regular rhythm, normal heart sounds and intact distal pulses.   Pulmonary/Chest: Effort normal and breath sounds normal.  Abdominal: Soft. Bowel sounds are normal. There is generalized tenderness.  Musculoskeletal: Normal range of motion.  Neurological: She is alert and oriented to person, place, and time.  Skin: Skin is warm.  Psychiatric: She has a normal mood and affect. Her behavior is normal. Judgment and thought content normal.  Nursing note and vitals reviewed.    ED Treatments / Results  Labs (all labs ordered are listed, but only abnormal results are displayed) Labs Reviewed  COMPREHENSIVE METABOLIC PANEL - Abnormal; Notable for the following:       Result Value   Sodium 129 (*)    Potassium 3.2 (*)    CO2 20 (*)    Albumin 3.2 (*)    All other components within normal limits  URINALYSIS, ROUTINE W REFLEX MICROSCOPIC - Abnormal; Notable for the following:    Hgb urine dipstick SMALL (*)    Bacteria, UA RARE (*)    Squamous  Epithelial / LPF 0-5 (*)    All other components within normal limits  C DIFFICILE QUICK SCREEN W PCR REFLEX  GASTROINTESTINAL PANEL BY PCR, STOOL (REPLACES STOOL CULTURE)  LIPASE, BLOOD  CBC    EKG  EKG Interpretation None       Radiology No results found.  Procedures Procedures (including critical care time)  Medications Ordered in ED Medications  ondansetron (ZOFRAN) injection 4 mg (4 mg Intravenous Not Given 10/05/16 1211)  sodium chloride 0.9 % bolus 1,000 mL (0 mLs Intravenous Stopped 10/05/16 1323)     Initial Impression / Assessment  and Plan / ED Course  I have reviewed the triage vital signs and the nursing notes.  Pertinent labs & imaging results that were available during my care of the patient were reviewed by me and considered in my medical decision making (see chart for details).     Pt d/w Dr. Linus Salmons (ID) who suggested restarting vancomycin at QID. Pt did have a dose prior to coming here at Dr. Storm Frisk recommendation.  FMT talk to be considered as an outpatient f/u visit.  Pt d/w Dr. Marily Memos for admission for dehydration and hyponatremia.  Final Clinical Impressions(s) / ED Diagnoses   Final diagnoses:  C. difficile diarrhea  Hyponatremia  Dehydration    New Prescriptions New Prescriptions   No medications on file     Isla Pence, MD 10/05/16 1339

## 2016-10-05 NOTE — Evaluation (Signed)
Physical Therapy Evaluation Patient Details Name: Sydney [name] MRN: [id number] DOB: 25-Dec-1930 Today's Date: 10/05/2016   History of Present Illness   Patient is an 83 yo female admitted 10/05/16 with diarrhea/C-diff, imbalance, and weakness.    PMH:  glaucoma, hyperlipidemia, hypertension, meningioma, palpitations, TIA, C. Difficile.       Clinical Impression  Patient presents with problems listed below.  Will benefit from acute PT to maximize functional mobility prior to discharge.  Patient with dizziness she reports is "more like off balance".  Performed Vestibular Assessment, testing negative.  Noted patient with decreased strength and coordination of LLE, impacting balance and gait.  Patient and daughter report this as new.  Recommend patient use a RW for mobility/safety.  Also recommend f/u HHPT for continued therapy.    Follow Up Recommendations Home health PT;Supervision - Intermittent    Equipment Recommendations  Rolling walker with 5" wheels;3in1 (PT)    Recommendations for Other Services       Precautions / Restrictions Precautions Precautions: Fall Restrictions Weight Bearing Restrictions: No      Mobility  Bed Mobility Overal bed mobility: Needs Assistance Bed Mobility: Supine to Sit;Sit to Supine     Supine to sit: Min guard Sit to supine: Min guard   General bed mobility comments: Assist for safety only.  Transfers Overall transfer level: Needs assistance Equipment used: None Transfers: Sit to/from Stand Sit to Stand: Min guard         General transfer comment: Min guard during transfers for safety/balance.  Ambulation/Gait Ambulation/Gait assistance: Min assist Ambulation Distance (Feet): 110 Feet Assistive device: None;Rolling walker (2 wheeled) Gait Pattern/deviations: Step-through pattern;Decreased step length - right;Decreased step length - left;Decreased stride length;Shuffle;Drifts right/left;Staggering left;Staggering right Gait  velocity: decreased Gait velocity interpretation: Below normal speed for age/gender General Gait Details: Patient initially attempted ambulation without assistive device.  Patient with poor balance, reaching for sink/wall to hold onto for support.  Provided patient with RW.  Patient able to maintain balance more easily with RW.  Patient continued to drift to both sides with RW.  Gait is slow and shuffling.  Patient running into equipment in hallway and door jam.  Decreased stability of LLE noted during gait.  Stairs            Wheelchair Mobility    Modified Rankin (Stroke Patients Only)       Balance Overall balance assessment: Needs assistance         Standing balance support: No upper extremity supported;Bilateral upper extremity supported Standing balance-Leahy Scale: Poor Standing balance comment: Very unstable with no assistive device.  Requiring UE support using RW for balance.                             Pertinent Vitals/Pain Pain Assessment: No/denies pain    Home Living Family/patient expects to be discharged to:: Private residence Living Arrangements: Children (Daughter) Available Help at Discharge: Family;Available PRN/intermittently (Daughter works 8:00 - 6:30.) Type of Home: House Home Access: Stairs to enter Entrance Stairs-Rails: Psychiatric nurse of Steps: 5 Home Layout: One level Home Equipment: None Additional Comments: Patient is alone during day when daughter is at work.    Prior Function Level of Independence: Independent         Comments: Patient and daughter walked in mall for exercise     Hand Dominance   Dominant Hand: Right    Extremity/Trunk Assessment   Upper Extremity Assessment Upper Extremity  Assessment: Defer to OT evaluation;LUE deficits/detail LUE Deficits / Details: Noted decreased coordination of Lt hand during session.    Lower Extremity Assessment Lower Extremity Assessment: LLE  deficits/detail LLE Deficits / Details: LLE strength at 4-/5 grossly; Noted decreased coordination and ataxic movement of LLE LLE Coordination: decreased gross motor       Communication   Communication: No difficulties  Cognition Arousal/Alertness: Awake/alert Behavior During Therapy: WFL for tasks assessed/performed Overall Cognitive Status: Within Functional Limits for tasks assessed                                        General Comments      Vestibular Assessment  Performed partial Vestibular Assessment Oculomotor:  WNL Head shaking, VOR - Normal Modified [place] to both directions - negative. Patient reports more "off balance" than dizzy.   Assessment/Plan    PT Assessment Patient needs continued PT services  PT Problem List Decreased strength;Decreased balance;Decreased mobility;Decreased coordination;Decreased knowledge of use of DME       PT Treatment Interventions DME instruction;Gait training;Stair training;Functional mobility training;Therapeutic activities;Therapeutic exercise;Balance training;Patient/family education    PT Goals (Current goals can be found in the Care Plan section)  Acute Rehab PT Goals Patient Stated Goal: To find out why I'm off balance and weak PT Goal Formulation: With patient/family Time For Goal Achievement: 10/12/16 Potential to Achieve Goals: Good    Frequency Min 3X/week   Barriers to discharge Decreased caregiver support Daughter works during day and patient home alone.    Co-evaluation               AM-PAC PT "6 Clicks" Daily Activity  Outcome Measure Difficulty turning over in bed (including adjusting bedclothes, sheets and blankets)?: None Difficulty moving from lying on back to sitting on the side of the bed? : None Difficulty sitting down on and standing up from a chair with arms (e.g., wheelchair, bedside commode, etc,.)?: A Little Help needed moving to and from a bed to chair (including a  wheelchair)?: A Little Help needed walking in hospital room?: A Little Help needed climbing 3-5 steps with a railing? : A Lot 6 Click Score: 19    End of Session Equipment Utilized During Treatment: Gait belt Activity Tolerance: Patient tolerated treatment well Patient left: in bed;with call bell/phone within reach;with nursing/sitter in room   PT Visit Diagnosis: Unsteadiness on feet (R26.81);Other abnormalities of gait and mobility (R26.89);Other symptoms and signs involving the nervous system (R29.898);Dizziness and giddiness (R42) (LLE weakness and decreased coordination)    Time: 5397-6734 PT Time Calculation (min) (ACUTE ONLY): 27 min   Charges:   PT Evaluation $PT Eval Moderate Complexity: 1 Procedure PT Treatments $Gait Training: 8-22 mins $Therapeutic Activity: 8-22 mins   PT G Codes:   PT G-Codes **NOT FOR INPATIENT CLASS** Functional Assessment Tool Used: AM-PAC 6 Clicks Basic Mobility Functional Limitation: Mobility: Walking and moving around Mobility: Walking and Moving Around Current Status (L9379): At least 20 percent but less than 40 percent impaired, limited or restricted Mobility: Walking and Moving Around Goal Status [phone]): At least 1 percent but less than 20 percent impaired, limited or restricted    Carita Pian. Sanjuana Kava, [facility] Acute Rehab Services Pager [place] 10/05/2016, 8:32 PM

## 2016-10-05 NOTE — Progress Notes (Signed)
Received report on pt.

## 2016-10-06 DIAGNOSIS — R5381 Other malaise: Secondary | ICD-10-CM

## 2016-10-06 DIAGNOSIS — Z8673 Personal history of transient ischemic attack (TIA), and cerebral infarction without residual deficits: Secondary | ICD-10-CM

## 2016-10-06 DIAGNOSIS — R2689 Other abnormalities of gait and mobility: Secondary | ICD-10-CM

## 2016-10-06 DIAGNOSIS — E871 Hypo-osmolality and hyponatremia: Secondary | ICD-10-CM | POA: Diagnosis not present

## 2016-10-06 DIAGNOSIS — R197 Diarrhea, unspecified: Secondary | ICD-10-CM | POA: Diagnosis not present

## 2016-10-06 LAB — GASTROINTESTINAL PANEL BY PCR, STOOL (REPLACES STOOL CULTURE)
ASTROVIRUS: NOT DETECTED
Adenovirus F40/41: NOT DETECTED
Campylobacter species: NOT DETECTED
Cryptosporidium: NOT DETECTED
Cyclospora cayetanensis: NOT DETECTED
ENTAMOEBA HISTOLYTICA: NOT DETECTED
ENTEROAGGREGATIVE E COLI (EAEC): NOT DETECTED
ENTEROTOXIGENIC E COLI (ETEC): NOT DETECTED
Enteropathogenic E coli (EPEC): NOT DETECTED
Giardia lamblia: NOT DETECTED
NOROVIRUS GI/GII: NOT DETECTED
Plesimonas shigelloides: NOT DETECTED
ROTAVIRUS A: NOT DETECTED
SAPOVIRUS (I, II, IV, AND V): NOT DETECTED
SHIGA LIKE TOXIN PRODUCING E COLI (STEC): NOT DETECTED
Salmonella species: NOT DETECTED
Shigella/Enteroinvasive E coli (EIEC): NOT DETECTED
VIBRIO CHOLERAE: NOT DETECTED
Vibrio species: NOT DETECTED
Yersinia enterocolitica: NOT DETECTED

## 2016-10-06 LAB — CBC
HEMATOCRIT: 32.3 % — AB (ref 36.0–46.0)
HEMOGLOBIN: 10.8 g/dL — AB (ref 12.0–15.0)
MCH: 29.9 pg (ref 26.0–34.0)
MCHC: 33.4 g/dL (ref 30.0–36.0)
MCV: 89.5 fL (ref 78.0–100.0)
Platelets: 193 10*3/uL (ref 150–400)
RBC: 3.61 MIL/uL — AB (ref 3.87–5.11)
RDW: 13 % (ref 11.5–15.5)
WBC: 3.2 10*3/uL — AB (ref 4.0–10.5)

## 2016-10-06 LAB — BASIC METABOLIC PANEL
ANION GAP: 6 (ref 5–15)
BUN: 7 mg/dL (ref 6–20)
CO2: 18 mmol/L — AB (ref 22–32)
Calcium: 8.3 mg/dL — ABNORMAL LOW (ref 8.9–10.3)
Chloride: 108 mmol/L (ref 101–111)
Creatinine, Ser: 0.58 mg/dL (ref 0.44–1.00)
GFR calc Af Amer: >60 mL/min (ref >60)
GLUCOSE: 78 mg/dL (ref 65–99)
POTASSIUM: 3.5 mmol/L (ref 3.5–5.1)
Sodium: 132 mmol/L — ABNORMAL LOW (ref 135–145)

## 2016-10-06 MED ORDER — FLUTICASONE PROPIONATE 50 MCG/ACT NA SUSP: 2.0000 | Freq: Every day | NASAL | Status: DC | PRN

## 2016-10-06 MED ORDER — VANCOMYCIN HCL 125 MG PO CAPS: 125.0000 mg | ORAL_CAPSULE | ORAL | Status: DC

## 2016-10-06 NOTE — Care Management Note (Signed)
Case Management Note  Patient Details  Name: Sydney [name] MRN: [id number] Date of Birth: 09-04-1930   Subjective/Objective:         Admitted 10/05/16 with diarrhea/C-diff, imbalance, and weakness.    PMH:  glaucoma, hyperlipidemia, hypertension, meningioma, palpitations, TIA, C. Difficile. Resides with daughter.         [name] (Daughter) [name] Mention (Sister)    [phone]      Action/Plan: Plan is to d/c to home today with home health services (PT,OT). [facility] referral in place for frequent ED admissions.  Expected Discharge Date:  10/06/16               Expected Discharge Plan:  [name]  In-House Referral:     Discharge planning Services  CM Consult  Post Acute Care Choice:    Choice offered to:  Patient  DME Arranged:   youth rolling walker DME Agency:   [place], referral made with Butch Penny.  HH Arranged:  PT, OT [place] Agency:  [place], referral made with Butch Penny  Status of Service:  Completed, signed off  If discussed at [place] of Stay Meetings, dates discussed:    Additional Comments:  Sharin Mons, RN 10/06/2016, 3:19 PM

## 2016-10-06 NOTE — Evaluation (Addendum)
Occupational Therapy Evaluation Patient Details Name: Sydney [name] MRN: [id number] DOB: 01-17-1931 Today's Date: 10/06/2016    History of Present Illness  Patient is an 83 yo female admitted 10/05/16 with diarrhea/C-diff, imbalance, and weakness.    PMH:  glaucoma, hyperlipidemia, hypertension, meningioma, palpitations, TIA, C. Difficile.     Clinical Impression   Pt is independent in ADL and IADL at baseline and does not rely on an AD for ADL. Pt presents with generalized weakness and impaired standing balance requiring min to min guard assist for ambulation and ADL transfers. Pt is agreeable to walker use and home health therapy. Recommended shower seat and only to get in tub with assist of her daughter. Pt verbalizing understanding. Pt to discharge home later today.    Follow Up Recommendations  Home health OT    Equipment Recommendations   (RW)    Recommendations for Other Services       Precautions / Restrictions Precautions Precautions: Fall      Mobility Bed Mobility Overal bed mobility: Modified Independent                Transfers Overall transfer level: Needs assistance Equipment used: None Transfers: Sit to/from Stand Sit to Stand: Supervision              Balance Overall balance assessment: Needs assistance   Sitting balance-Leahy Scale: Good       Standing balance-Leahy Scale: Fair Standing balance comment: poor dynamic balance, fair static                           ADL either performed or assessed with clinical judgement   ADL Overall ADL's : Needs assistance/impaired Eating/Feeding: Independent;Sitting   Grooming: Wash/dry hands;Standing;Supervision/safety   Upper Body Bathing: Set up;Sitting   Lower Body Bathing: Supervison/ safety;Sit to/from stand   Upper Body Dressing : Set up;Sitting   Lower Body Dressing: Supervision/safety;Sit to/from stand   Toilet Transfer: Minimal assistance;Ambulation (hand held)    Toileting- Water quality scientist and Hygiene: Supervision/safety;Sit to/from Nurse, children's Details (indicate cue type and reason): recommended pt consider shower seat, only to get in tub with daughter's supervision Functional mobility during ADLs: Minimal assistance (hand held assist in room)       Vision Baseline Vision/History: Glaucoma Patient Visual Report: No change from baseline       Perception     Praxis      Pertinent Vitals/Pain Pain Assessment: No/denies pain     Hand Dominance Right   Extremity/Trunk Assessment Upper Extremity Assessment Upper Extremity Assessment: Overall WFL for tasks assessed   Lower Extremity Assessment Lower Extremity Assessment: Defer to PT evaluation       Communication Communication Communication: No difficulties   Cognition Arousal/Alertness: Awake/alert Behavior During Therapy: WFL for tasks assessed/performed Overall Cognitive Status: Within Functional Limits for tasks assessed                                     General Comments       Exercises     Shoulder Instructions      Home Living Family/patient expects to be discharged to:: Private residence Living Arrangements: Children Available Help at Discharge: Family;Available PRN/intermittently Type of Home: House Home Access: Stairs to enter [employer] of Steps: 5 Entrance Stairs-Rails: Right;Left Home Layout: One level     Bathroom Shower/Tub: Tub/shower unit  Bathroom Toilet: Standard     Home Equipment: None   Additional Comments: Patient is alone during day when daughter is at work.      Prior Functioning/Environment Level of Independence: Independent        Comments: pt gets down in tub to bathe        OT Problem List: Impaired balance (sitting and/or standing)      OT Treatment/Interventions:      OT Goals(Current goals can be found in the care plan section) Acute Rehab OT Goals Patient Stated  Goal: to return to independence  OT Frequency:     Barriers to D/C:            Co-evaluation              AM-PAC PT "6 Clicks" Daily Activity     Outcome Measure Help from another person eating meals?: None Help from another person taking care of personal grooming?: A Little Help from another person toileting, which includes using toliet, bedpan, or urinal?: A Little Help from another person bathing (including washing, rinsing, drying)?: A Little Help from another person to put on and taking off regular upper body clothing?: None Help from another person to put on and taking off regular lower body clothing?: A Little 6 Click Score: 20   End of Session Equipment Utilized During Treatment: Gait belt  Activity Tolerance: Patient tolerated treatment well Patient left: in chair;with call bell/phone within reach;with chair alarm set  OT Visit Diagnosis: Unsteadiness on feet (R26.81)                Time: 1410-1434 OT Time Calculation (min): 24 min Charges:  OT General Charges $OT Visit: 1 Procedure OT Evaluation $OT Eval Moderate Complexity: 1 Procedure OT Treatments $Self Care/Home Management : 8-22 mins G-Codes: OT G-codes **NOT FOR INPATIENT CLASS** Functional Assessment Tool Used: Clinical judgement Functional Limitation: Self care Self Care Current Status (I9485): At least 1 percent but less than 20 percent impaired, limited or restricted Self Care Discharge Status [phone]): At least 1 percent but less than 20 percent impaired, limited or restricted   Malka So 10/06/2016, 2:45 PM  [phone]

## 2016-10-06 NOTE — Progress Notes (Signed)
Pt given discharge instructions and care notes. Pt verbalized understanding AEB no further questions or concerns at this time. IV was discontinued, no redness, pain, or swelling noted at this time. Telemetry discontinued and Centralized Telemetry was notified. Pt will leave the floor via wheelchair with staff in stable condition.

## 2016-10-06 NOTE — Discharge Instructions (Signed)

## 2016-10-06 NOTE — Progress Notes (Signed)
Physical Therapy Treatment Patient Details Name: Sydney [name] MRN: [id number] DOB: [birth year] Today's Date: 10/06/2016    History of Present Illness  Patient is an 83 yo female admitted 10/05/16 with diarrhea/C-diff, imbalance, and weakness.    PMH:  glaucoma, hyperlipidemia, hypertension, meningioma, palpitations, TIA, C. Difficile.      PT Comments    Pt performed increased gait and functional mobility.  LOB observed without AD, Pt required RW for additional trial with no LOB.  Pt performed stair training with B rails and good technique observed.  Pt will require a RW and BSC at d/c home.  Plan for HHPT remains appropriate.      Follow Up Recommendations  Home health PT;Supervision - Intermittent     Equipment Recommendations  Rolling walker with 5" wheels;3in1 (PT)    Recommendations for Other Services       Precautions / Restrictions Precautions Precautions: Fall Restrictions Weight Bearing Restrictions: No    Mobility  Bed Mobility Overal bed mobility: Modified Independent             General bed mobility comments: Pt in recliner on arrival.    Transfers Overall transfer level: Needs assistance Equipment used: Rolling walker (2 wheeled);None Transfers: Sit to/from Stand Sit to Stand: Supervision         General transfer comment: Cues for hand placement to and from seated surface.  STS from recliner without device.  Stand to sit to recliner with RW.    Ambulation/Gait Ambulation/Gait assistance: Min assist Ambulation Distance (Feet): 120 Feet (+ 1ft no AD.  additonal trial of 100 ft with RW.  )   Gait Pattern/deviations: Step-through pattern;Trunk flexed;Scissoring;Narrow base of support;Decreased step length - right Gait velocity: decreased Gait velocity interpretation: Below normal speed for age/gender General Gait Details: Pt performed gait without device initially veering to the R pt also observed scissoring.  Pt with improved balance with use of  RW.  Pt used standard height RW but will need a youth height RW for correct fit.  Pt required cues for RW position and and increasing stride length.  Cues for RW use during obstacle negotiation, turns and backing.     Stairs Stairs: Yes   Stair Management: Two rails;Forwards Number of Stairs: 4 General stair comments: Cues for sequencing and hand placement on railing.  Supervision for safety (close supervision).  NO LOB with use of B rails.    Wheelchair Mobility    Modified Rankin (Stroke Patients Only)       Balance Overall balance assessment: Needs assistance   Sitting balance-Leahy Scale: Normal     Standing balance support: No upper extremity supported;Bilateral upper extremity supported Standing balance-Leahy Scale: Poor Standing balance comment: poor dynamic balance, fair static                            Cognition Arousal/Alertness: Awake/alert Behavior During Therapy: WFL for tasks assessed/performed Overall Cognitive Status: Within Functional Limits for tasks assessed                                        Exercises      General Comments        Pertinent Vitals/Pain Pain Assessment: No/denies pain    Home Living Family/patient expects to be discharged to:: Private residence Living Arrangements: Children Available Help at Discharge: Family;Available PRN/intermittently Type of Home: House  Home Access: Stairs to enter Entrance Stairs-Rails: Right;Left Home Layout: One level Home Equipment: None Additional Comments: Patient is alone during day when daughter is at work.    Prior Function Level of Independence: Independent      Comments: pt gets down in tub to bathe   PT Goals (current goals can now be found in the care plan section) Acute Rehab PT Goals Patient Stated Goal: to return to independence Potential to Achieve Goals: Good Progress towards PT goals: Progressing toward goals    Frequency    Min  3X/week      PT Plan Current plan remains appropriate    Co-evaluation              AM-PAC PT "6 Clicks" Daily Activity  Outcome Measure  Difficulty turning over in bed (including adjusting bedclothes, sheets and blankets)?: None Difficulty moving from lying on back to sitting on the side of the bed? : None Difficulty sitting down on and standing up from a chair with arms (e.g., wheelchair, bedside commode, etc,.)?: A Little Help needed moving to and from a bed to chair (including a wheelchair)?: A Little Help needed walking in hospital room?: A Little Help needed climbing 3-5 steps with a railing? : A Little 6 Click Score: 20    End of Session Equipment Utilized During Treatment: Gait belt Activity Tolerance: Patient tolerated treatment well Patient left: with call bell/phone within reach;in chair;with chair alarm set;with nursing/sitter in room Nurse Communication: Mobility status (informed nurse case manager of need for youth height RW.  ) PT Visit Diagnosis: Unsteadiness on feet (R26.81);Other abnormalities of gait and mobility (R26.89);Other symptoms and signs involving the nervous system (R29.898);Dizziness and giddiness (R42) (LLE weakness and decreased coordination)     Time: 1441-1459 PT Time Calculation (min) (ACUTE ONLY): 18 min  Charges:  $Gait Training: 8-22 mins                    G Codes:       Governor Rooks, PTA pager [phone]    Cristela Blue 10/06/2016, 3:15 PM

## 2016-10-06 NOTE — Discharge Summary (Addendum)
Physician Discharge Summary  Sydney [name] OQH:[id number] DOB: 1930-07-21  PCP: [name], MD  Admit date: 10/05/2016 Discharge date: 10/06/2016  Recommendations for Outpatient Follow-up:  1. Dr. [name], PCP In 3 days with repeat labs (CBC, BMP & Mg). 2. Dr. Carlyle Basques, Infectious Disease: Advised to call for an appointment.  Home Health: PT, OT Equipment/Devices: Rolling walker with 5 inch wheels & 3 n 1    Discharge Condition: Improved and stable  CODE STATUS: Full  Diet recommendation: Heart healthy diet.  Discharge Diagnoses:  Active Problems:   Hyponatremia   C. difficile colitis   Physical deconditioning   Recurrent Clostridium difficile diarrhea   Balance problem   History of transient ischemic attack (TIA)   Dehydration   Brief Summary: 83 year old female with PMH of glaucoma, HLD, HTN, meningioma, osteoporosis, TIA, recurrent C. difficile on slow oral vancomycin taper, down to every third day and supposed to stop after 10/07/16 dose, follows with [name] of infectious disease, developed new onset acute diarrhea in the 24 hours prior to admission and was advised by [name] to come to the ED for evaluation and management.  Assessment and plan:  1. Acute diarrhea:? Acute viral GE versus food poisoning Vs IBS flare. Less likely due to recurrent C. difficile. Patient reports that she normally has up to 2 formed BMs daily. A day prior to admission, she had 4-5 episode of loose mixed with some solid stools but since midnight prior to admission, she had multiple episodes of watery stools. This prompted the hospital admission. No nausea, vomiting, abdominal pain, fever or chills reported. Patient did volunteer to eating a meal from [place] the night before consisting of [place] and [place] dressing. No other family members with similar complaints. On admission, Infectious disease M.D. on call recommended continuing oral vancomycin 125 MG 4 times a day. C. difficile  panel and GI pathogen panel PCR were checked and negative. She has not had any further diarrhea since hospitalization. She reports 2 formed stools last night and one this morning. She states that she "enjoyed" hospital breakfast this morning without nausea, vomiting or abdominal pain. I discussed with infectious disease M.D. on call today and agree that she can complete her last dose of vancomycin as scheduled on 10/07/16 and then stop. She may continue probiotics. Outpatient follow-up with [name]. 2. Unsteady gait/balance,? LLE weakness and pain: Apparently has had problems with balance since 10/02/16 when she was seen in the ED and stroke workup including MRI brain were negative. She reported that her symptoms are worse when initially going from lying or sitting to standing with ambulation but improves as she continues to ambulate. Denied vertigo. Reported some generalized weakness over the last several days and difficulty caring for herself. She reported some pain in her left lower anterior thigh, just above the knee which she states she gets any time she lies down for a long time and has happened to her in the past when laying in the hospital bed. No asymmetric swelling or recent travel. Also reports similar pain when the weather gets cold. No clear focal deficits on exam. No acute findings on local musculoskeletal exam either. PT recommends home health PT and DME which have been arranged. Discussed at length with patient and daughter and advised to follow-up with PCP and may consider outpatient neurology consultation if this continues to be a problem. They verbalized understanding. Low index of suspicion for DVT but offered to get a venous Doppler which  patient kindly declined. Orthostatic blood pressure checks were negative. Her imbalance may also be related to overall physical deconditioned state. Low index of suspicion for eustachian tube dysfunction in the absence of ENT symptoms. Can follow-up with PCP  or ENT as outpatient regarding cerumen disimpaction. Vestibular assessment by PT was negative. 3. Hyponatremia: Possibly from acute diarrhea and GI losses. Improved from 129>132. Follow BMP in a few days as outpatient. 4. Hypokalemia: Secondary to GI losses. Improved after replacement. 5. History of TIA/?? Carotid disease: Continue aspirin and Plavix and defer to PCP regarding stopping Plavix if no clear indication. 6. Essential hypertension: Controlled. Not on medications at home. 7. Hyperlipidemia: Not on statins at home. 8. Anemia: Stable. Outpatient follow-up   Consultations:  Discussed with infectious disease M.D. on call.  Procedures:  None.   Discharge Instructions  Discharge Instructions    Call MD for:    Complete by:  As directed    Recurrent diarrhea.   Call MD for:  extreme fatigue    Complete by:  As directed    Call MD for:  persistant dizziness or light-headedness    Complete by:  As directed    Call MD for:  persistant nausea and vomiting    Complete by:  As directed    Call MD for:  severe uncontrolled pain    Complete by:  As directed    Call MD for:  temperature >100.4    Complete by:  As directed    Diet - low sodium heart healthy    Complete by:  As directed    Increase activity slowly    Complete by:  As directed        Medication List    STOP taking these medications   meloxicam 7.5 MG tablet Commonly known as:  MOBIC   ondansetron 8 MG disintegrating tablet Commonly known as:  ZOFRAN ODT   potassium chloride SA 20 MEQ tablet Commonly known as:  K-DUR,KLOR-CON     TAKE these medications   cholecalciferol 1000 units tablet Commonly known as:  VITAMIN D Take 1,000 Units by mouth daily.   clopidogrel 75 MG tablet Commonly known as:  PLAVIX Take 75 mg by mouth daily.   ECOTRIN LOW STRENGTH 81 MG EC tablet Generic drug:  aspirin Take 81 mg by mouth daily. Swallow whole.   FLORASTOR PO Take by mouth.   fluticasone 50 MCG/ACT nasal  spray Commonly known as:  FLONASE Place 2 sprays into both nostrils daily as needed for allergies or rhinitis.   folic acid 1 MG tablet Commonly known as:  FOLVITE Take 1 mg by mouth daily.   latanoprost 0.005 % ophthalmic solution Commonly known as:  XALATAN Place 1 drop into both eyes at bedtime.   loratadine 10 MG tablet Commonly known as:  CLARITIN Take 10 mg by mouth daily as needed for allergies (for seasonal allergies).   vancomycin 125 MG capsule Commonly known as:  VANCOCIN Take 1 capsule (125 mg total) by mouth See admin instructions. Taken once a day every three days. yesterday was the 9/10 dose. Completes last dose on 10/07/2016. What changed:  additional instructions  Another medication with the same name was removed. Continue taking this medication, and follow the directions you see here.      Follow-up Information    [name], MD. Schedule an appointment as soon as possible for a visit in 3 day(s).   Specialty:  Internal Medicine Why:  To be seen with repeat labs (CBC &  BMP). Contact information: [address] [place] [address] [phone]        Carlyle Basques, MD. Schedule an appointment as soon as possible for a visit.   Specialty:  Infectious Diseases Contact information: [address] [place] [address] [phone]          Allergies  Allergen Reactions  . Demerol [Meperidine] Other (See Comments)    Excessive sweating, cramping  . Codeine Rash  . Sulfa Antibiotics Other (See Comments)    Reaction unknown  . Tomato Itching      Procedures/Studies: None this admission   Subjective: Feels much better. No further diarrhea since admission. Reports 2 formed stools last night and one this morning. Enjoyed hospital breakfast. No nausea, vomiting or abdominal pain reported. Reported some pain over the left lower anterior thigh which she states she gets when she lies down for a long time and  has happened to her in the past when she laid in the hospital bed. Work with PT. Denies dizziness, lightheadedness, vertigo, earache or hearing deficits.  Discharge Exam:  Vitals:   10/05/16 1733 10/05/16 1736 10/05/16 2311 10/06/16 0547  BP: 131/70 118/72 125/77 123/68  Pulse: 68 77 80 75  Resp:   18 18  Temp:   99.3 F (37.4 C) 98.5 F (36.9 C)  TempSrc:   Oral Oral  SpO2:   100% 100%  Weight:      Height:        General: Pleasant elderly female, thin built and frail, chronically ill looking, lying comfortably propped up in bed. Oral mucosa moist. Cardiovascular: S1 & S2 heard, RRR, S1/S2 +. No murmurs, rubs, gallops or clicks. No JVD or pedal edema. Telemetry: Sinus rhythm. Occasional sinus bradycardia in the 50s. Respiratory: Clear to auscultation without wheezing, rhonchi or crackles. No increased work of breathing. Abdominal:  Non distended, non tender & soft. No organomegaly or masses appreciated. Normal bowel sounds heard. CNS: Alert and oriented. No focal deficits. Extremities: no edema, no cyanosis. No asymmetric swelling or tenderness or acute findings in left lower extremity.    The results of significant diagnostics from this hospitalization (including imaging, microbiology, ancillary and laboratory) are listed below for reference.     Microbiology: Recent Results (from the past 240 hour(s))  C difficile quick scan w PCR reflex     Status: None   Collection Time: 10/05/16 12:47 PM  Result Value Ref Range Status   C Diff antigen NEGATIVE NEGATIVE Final   C Diff toxin NEGATIVE NEGATIVE Final   C Diff interpretation No C. difficile detected.  Final  Gastrointestinal Panel by PCR , Stool     Status: None   Collection Time: 10/05/16 12:47 PM  Result Value Ref Range Status   Campylobacter species NOT DETECTED NOT DETECTED Final   Plesimonas shigelloides NOT DETECTED NOT DETECTED Final   Salmonella species NOT DETECTED NOT DETECTED Final   Yersinia enterocolitica NOT  DETECTED NOT DETECTED Final   Vibrio species NOT DETECTED NOT DETECTED Final   Vibrio cholerae NOT DETECTED NOT DETECTED Final   Enteroaggregative E coli (EAEC) NOT DETECTED NOT DETECTED Final   Enteropathogenic E coli (EPEC) NOT DETECTED NOT DETECTED Final   Enterotoxigenic E coli (ETEC) NOT DETECTED NOT DETECTED Final   Shiga like toxin producing E coli (STEC) NOT DETECTED NOT DETECTED Final   Shigella/Enteroinvasive E coli (EIEC) NOT DETECTED NOT DETECTED Final   Cryptosporidium NOT DETECTED NOT DETECTED Final   Cyclospora cayetanensis  NOT DETECTED NOT DETECTED Final   Entamoeba histolytica NOT DETECTED NOT DETECTED Final   Giardia lamblia NOT DETECTED NOT DETECTED Final   Adenovirus F40/41 NOT DETECTED NOT DETECTED Final   Astrovirus NOT DETECTED NOT DETECTED Final   Norovirus GI/GII NOT DETECTED NOT DETECTED Final   Rotavirus A NOT DETECTED NOT DETECTED Final   Sapovirus (I, II, IV, and V) NOT DETECTED NOT DETECTED Final     Labs: CBC:  Recent Labs Lab 10/02/16 1218 10/05/16 0935 10/06/16 0340  WBC 3.2* 4.0 3.2*  HGB 11.2* 12.2 10.8*  HCT 34.6* 36.3 32.3*  MCV 90.3 90.1 89.5  PLT 208 220 875   Basic Metabolic Panel:  Recent Labs Lab 10/02/16 1218 10/05/16 0935 10/06/16 0340  NA 135 129* 132*  K 3.6 3.2* 3.5  CL 106 103 108  CO2 23 20* 18*  GLUCOSE 89 80 78  BUN 8 8 7   CREATININE 0.60 0.77 0.58  CALCIUM 9.1 9.0 8.3*   Liver Function Tests:  Recent Labs Lab 10/05/16 0935  AST 26  ALT 16  ALKPHOS 74  BILITOT 0.7  PROT 6.8  ALBUMIN 3.2*   Urinalysis    Component Value Date/Time   COLORURINE YELLOW 10/05/2016 1201   APPEARANCEUR CLEAR 10/05/2016 1201   LABSPEC 1.008 10/05/2016 1201   PHURINE 6.0 10/05/2016 1201   GLUCOSEU NEGATIVE 10/05/2016 1201   HGBUR SMALL (A) 10/05/2016 1201   BILIRUBINUR NEGATIVE 10/05/2016 1201   KETONESUR NEGATIVE 10/05/2016 1201   PROTEINUR NEGATIVE 10/05/2016 1201   UROBILINOGEN 0.2 23-Oct-202014 1048   NITRITE NEGATIVE  10/05/2016 1201   LEUKOCYTESUR NEGATIVE 10/05/2016 1201   Discussed in detail with patient's daughter multiple times. Updated care and answered questions.   Time coordinating discharge: Less than 30 minutes  SIGNED:  Vernell Leep, MD, FACP, FHM. [facility] Pager [phone]  If 7PM-7AM, please contact night-coverage [email or web address] Password [facility] 10/06/2016, 2:47 PM

## 2016-10-06 NOTE — Care Management Obs Status (Signed)
[facility] NOTIFICATION   Patient Details  Name: Sydney [name] MRN: [id number] Date of Birth: 02/03/1931   Medicare Observation Status Notification Given:  Yes    Sharin Mons, RN 10/06/2016, 2:53 PM

## 2016-10-06 NOTE — Consult Note (Signed)
   [facility] CM Inpatient Consult   10/06/2016  Sydney [name] [birth year] [id number]  .Referral received for [facility] Care Management services for patient in the Medicare ACO with multiple ED visits.  Met with the Patient and [name] at the bedside currently admitted with Hx of C-diff diarrhea and weakness. Patient endorse Dr. [name] as her primary care provider.Explained [facility] Care Management services for post hospital community follow up and resources.  Daughter and patient discuss that they would have home health care and would like to look over the packet and determine if other needs would be appropriate.   Packet with [place] Management information given.  Will follow up with inpatient RNCM, [name] with the outcomes of the referral.  For further questions or needs, please contact:  Natividad Brood, RN BSN [facility] Liaison  [phone] business mobile phone Toll free office [phone]

## 2016-10-09 ENCOUNTER — Other Ambulatory Visit: Payer: Self-pay

## 2016-10-09 NOTE — Patient Outreach (Signed)
[facility]) Care Management  10/09/16  Sydney [name] [birth year] [id number]  Received referral from Whitman Hero, RN on 10/07/2016 for multiple ED visits.  Patient has had 7 visit to the ED in the last 6 months. 04/12/2017 - for constipation 05/14/2016 - for diarrhea 06/10/2016 - for diarrhea 07/15/2016 - for diarrhea 07/29/2016 - for c-difficile diarrhea 10/03/2015 - for vertigo 10/05/2016 - 10/07/2016 OBS for diarrhea/C-diff, imbalance, and weakness    Home health PT/OT referral ([facility])  PMH:  glaucoma, hyperlipidemia, hypertension, meningioma, palpitations, TIA, C. Difficile  Successful outreach completed with patient, patient identification verified. RNCM provided education about [facility] Care Management and RNCM role and patient stated that she wanted to hold off for now on services. She stated that she is feeling much better since discharge home and that her diarrhea had resolved. She stated that she currently does not have any needs.  Patient stated that home health has not yet been out to see her. Patient's lives with her daughter, but she is currently not at home.   RNCM provided contact information and educated that if they change their mind and would like to receive [place] Management services that they can call back at any time and patient was agreeable.  Plan: Will close due to declination of services at present.  [place] R. Drystan Reader, RN, BSN, [place] Management Coordinator [phone]

## 2016-10-11 ENCOUNTER — Encounter (HOSPITAL_COMMUNITY): Payer: Self-pay | Admitting: Emergency Medicine

## 2016-10-11 ENCOUNTER — Emergency Department (HOSPITAL_COMMUNITY)
Admission: EM | Admit: 2016-10-11 | Discharge: 2016-10-11 | Disposition: A | Discharge status: Home / Self Care | Payer: Medicare Other | Attending: Emergency Medicine | Admitting: Emergency Medicine

## 2016-10-11 DIAGNOSIS — E860 Dehydration: Secondary | ICD-10-CM | POA: Diagnosis not present

## 2016-10-11 DIAGNOSIS — Z8673 Personal history of transient ischemic attack (TIA), and cerebral infarction without residual deficits: Secondary | ICD-10-CM | POA: Insufficient documentation

## 2016-10-11 DIAGNOSIS — R531 Weakness: Secondary | ICD-10-CM | POA: Diagnosis present

## 2016-10-11 DIAGNOSIS — Z79899 Other long term (current) drug therapy: Secondary | ICD-10-CM | POA: Diagnosis not present

## 2016-10-11 DIAGNOSIS — R197 Diarrhea, unspecified: Secondary | ICD-10-CM

## 2016-10-11 DIAGNOSIS — E876 Hypokalemia: Secondary | ICD-10-CM | POA: Diagnosis not present

## 2016-10-11 DIAGNOSIS — Z7982 Long term (current) use of aspirin: Secondary | ICD-10-CM | POA: Diagnosis not present

## 2016-10-11 DIAGNOSIS — I10 Essential (primary) hypertension: Secondary | ICD-10-CM | POA: Diagnosis not present

## 2016-10-11 LAB — CBC
HEMATOCRIT: 36.9 % (ref 36.0–46.0)
Hemoglobin: 12.5 g/dL (ref 12.0–15.0)
MCH: 29.8 pg (ref 26.0–34.0)
MCHC: 33.9 g/dL (ref 30.0–36.0)
MCV: 87.9 fL (ref 78.0–100.0)
Platelets: 280 10*3/uL (ref 150–400)
RBC: 4.2 MIL/uL (ref 3.87–5.11)
RDW: 13.1 % (ref 11.5–15.5)
WBC: 8.6 10*3/uL (ref 4.0–10.5)

## 2016-10-11 LAB — BASIC METABOLIC PANEL
Anion gap: 8 (ref 5–15)
BUN: 8 mg/dL (ref 6–20)
CALCIUM: 9.2 mg/dL (ref 8.9–10.3)
CO2: 21 mmol/L — ABNORMAL LOW (ref 22–32)
Chloride: 105 mmol/L (ref 101–111)
Creatinine, Ser: 0.7 mg/dL (ref 0.44–1.00)
GFR calc Af Amer: >60 mL/min (ref >60)
GFR calc non Af Amer: >60 mL/min (ref >60)
GLUCOSE: 93 mg/dL (ref 65–99)
POTASSIUM: 3.1 mmol/L — AB (ref 3.5–5.1)
Sodium: 134 mmol/L — ABNORMAL LOW (ref 135–145)

## 2016-10-11 LAB — CBG MONITORING, ED: Glucose-Capillary: 81 mg/dL (ref 65–99)

## 2016-10-11 MED ORDER — SODIUM CHLORIDE 0.9 % IV BOLUS (SEPSIS)
500.0000 mL | Freq: Once | INTRAVENOUS | Status: AC
  Administered 2016-10-11: 500 mL via INTRAVENOUS

## 2016-10-11 MED ORDER — POTASSIUM CHLORIDE CRYS ER 20 MEQ PO TBCR
40.0000 meq | EXTENDED_RELEASE_TABLET | Freq: Once | ORAL | Status: AC
  Administered 2016-10-11: 40 meq via ORAL
  Filled 2016-10-11: qty 2

## 2016-10-11 NOTE — ED Provider Notes (Signed)
[facility] DEPT Provider Note   CSN: [id number] Arrival date & time: 10/11/16  6283     History   Chief Complaint Chief Complaint  Patient presents with  . Weakness  . Diarrhea    HPI Sydney [name] is a 83 y.o. female.  Patient is a 83 year old female with a history of hypertension, palpitations, TIA and recent recurrent C. difficile colitis who presents with fatigue. She was recently admitted to the hospital for recurrent episode of C. difficile. She had associated hyponatremia. She completed a another round of vancomycin. She did have C. difficile testing during hospitalization that was negative. Her vancomycin was stopped on discharge. She was discharged on June 12. She states that she is been fatigued since that time but this morning woke up and felt a lot worse. She's had 4 episodes of watery diarrhea during the night. It was nonbloody. She hasn't had any prior diarrhea since she left the hospital. She denies abdominal pain other than some cramping prior to the diarrhea. She has some mild nausea but no vomiting. No fevers. No urinary symptoms.      Past Medical History:  Diagnosis Date  . Arthritis    "left hand" (02/17/2013)  . Glaucoma   . High cholesterol    "on RX years ago" (02/17/2013)  . Hypertension   . Meningioma ([place]) 05/01/2015  . Osteoporosis 02/2014   T score -2.5  followed by Dr. Lysle Rubens  . Palpitations    PACs, PVCs and short runs of atrial tachycardia on Holter monitoring  . TIA (transient ischemic attack) 1990's    Patient Active Problem List   Diagnosis Date Noted  . C. difficile colitis 10/05/2016  . Physical deconditioning 10/05/2016  . Recurrent Clostridium difficile diarrhea 10/05/2016  . Balance problem 10/05/2016  . History of transient ischemic attack (TIA) 10/05/2016  . Dehydration   . Hypokalemia 05/14/2016  . Anemia 05/14/2016  . Hyponatremia 05/14/2016  . Glaucoma 05/14/2016  . Enteritis 05/14/2016  . Meningioma ([place])  05/01/2015  . 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 Diplopia 04/08/2015  . Leukopenia 04/08/2015  . Bradycardia 02/17/2013  . Occlusion and stenosis of carotid artery without mention of cerebral infarction 02/17/2013    Past Surgical History:  Procedure Laterality Date  . ANAL FISTULECTOMY  1970  . TONSILLECTOMY      OB History    Gravida Para Term Preterm AB Living   1 1 1     1    SAB TAB Ectopic Multiple Live Births                   Home Medications    Prior to Admission medications   Medication Sig Start Date End Date Taking? Authorizing Provider  aspirin (ECOTRIN LOW STRENGTH) 81 MG EC tablet Take 81 mg by mouth daily. Swallow whole.   Yes [provider]  cholecalciferol (VITAMIN D) 1000 UNITS tablet Take 1,000 Units by mouth daily.    Yes [provider]  clopidogrel (PLAVIX) 75 MG tablet Take 75 mg by mouth daily.   Yes [provider]  fluticasone (FLONASE) 50 MCG/ACT nasal spray Place 2 sprays into both nostrils daily as needed for allergies or rhinitis. 10/06/16  Yes Hongalgi, Lenis Dickinson, MD  folic acid (FOLVITE) 1 MG tablet Take 1 mg by mouth daily.   Yes [provider]  latanoprost (XALATAN) 0.005 % ophthalmic solution Place 1 drop into both eyes at bedtime.    Yes [provider]  vancomycin Zollie Pee)  125 MG capsule Take 1 capsule (125 mg total) by mouth See admin instructions. Taken once a day every three days. yesterday was the 9/10 dose. Completes last dose on 10/07/2016. Patient not taking: Reported on 10/11/2016 10/06/16   Modena Jansky, MD    Family History Family History  Problem Relation Age of Onset  . Hypertension Father   . Heart attack Father   . Stroke Brother   . Stroke Maternal Grandmother     Social History Social History  Substance Use Topics  . Smoking status: Never Smoker  . Smokeless tobacco: Never Used  . Alcohol use No     Allergies   Demerol [meperidine]; Codeine; Sulfa antibiotics;  and Tomato   Review of Systems Review of Systems  Constitutional: Positive for fatigue. Negative for chills, diaphoresis and fever.  HENT: Negative for congestion, rhinorrhea and sneezing.   Eyes: Negative.   Respiratory: Negative for cough, chest tightness and shortness of breath.   Cardiovascular: Negative for chest pain and leg swelling.  Gastrointestinal: Positive for diarrhea and nausea. Negative for abdominal pain, blood in stool and vomiting.  Genitourinary: Negative for difficulty urinating, flank pain, frequency and hematuria.  Musculoskeletal: Negative for arthralgias and back pain.  Skin: Negative for rash.  Neurological: Negative for dizziness, speech difficulty, weakness, numbness and headaches.     Physical Exam Updated Vital Signs BP (!) 112/58   Pulse 81   Temp 98.5 F (36.9 C) (Oral)   Resp 14   SpO2 100%   Physical Exam  Constitutional: She is oriented to person, place, and time. She appears well-developed and well-nourished.  HENT:  Head: Normocephalic and atraumatic.  Eyes: Pupils are equal, round, and reactive to light.  Neck: Normal range of motion. Neck supple.  Cardiovascular: Normal rate, regular rhythm and normal heart sounds.   Pulmonary/Chest: Effort normal and breath sounds normal. No respiratory distress. She has no wheezes. She has no rales. She exhibits no tenderness.  Abdominal: Soft. Bowel sounds are normal. There is no tenderness. There is no rebound and no guarding.  Musculoskeletal: Normal range of motion. She exhibits no edema.  Lymphadenopathy:    She has no cervical adenopathy.  Neurological: She is alert and oriented to person, place, and time.  Skin: Skin is warm and dry. No rash noted.  Psychiatric: She has a normal mood and affect.     ED Treatments / Results  Labs (all labs ordered are listed, but only abnormal results are displayed) Labs Reviewed  BASIC METABOLIC PANEL - Abnormal; Notable for the following:       Result  Value   Sodium 134 (*)    Potassium 3.1 (*)    CO2 21 (*)    All other components within normal limits  C DIFFICILE QUICK SCREEN W PCR REFLEX  CBC  CBG MONITORING, ED    EKG  EKG Interpretation  Date/Time:  Sunday October 11 2016 06:46:18 EDT Ventricular Rate:  66 PR Interval:    QRS Duration: 70 QT Interval:  372 QTC Calculation: 390 R Axis:   65 Text Interpretation:  Ectopic atrial rhythm Probable anteroseptal infarct, old Confirmed by Malvin Johns [phone]) on 10/11/2016 7:07:51 AM Also confirmed by Malvin Johns [phone]), editor Drema Pry [phone])  on 10/11/2016 8:09:43 AM       Radiology No results found.  Procedures Procedures (including critical care time)  Medications Ordered in ED Medications  sodium chloride 0.9 % bolus 500 mL (0 mLs Intravenous Stopped 10/11/16 0908)  potassium  chloride SA (K-DUR,KLOR-CON) CR tablet 40 mEq (40 mEq Oral Given 10/11/16 0913)  sodium chloride 0.9 % bolus 500 mL (500 mLs Intravenous New Bag/Given 10/11/16 0917)     Initial Impression / Assessment and Plan / ED Course  I have reviewed the triage vital signs and the nursing notes.  Pertinent labs & imaging results that were available during my care of the patient were reviewed by me and considered in my medical decision making (see chart for details).     Patient presents with diarrhea. She had 4 episodes of diarrhea during the night. She has a history of recent recurrent C. difficile. She did however have a negative C. difficile test on June 11. She's not currently on antibiotics. She did appear to be a little dehydrated and had some hypokalemia. She was given potassium replacement in the ED and also given some normal saline. She's feeling much better and is able to ambulate without assistance. She has no dizziness on ambulation. Spoke with Dr. Linus Salmons with infectious disease who recommends not treating for C. difficile at this point. We did attempt to get a stool sample for C.  difficile testing as he did recommend doing repeat testing if she's able to give a sample. However she has not had a stool since she's been to the ED over 6 hours. I feel this is reassuring. She will follow-up with her infectious disease doctor, [name] if her diarrhea continues. I also encouraged her to follow-up with her PCP to have her potassium and sodium follow. Return precautions were given. She was advised that she can use Imodium at home for the diarrhea and she's been using Gatorade due to her previous hyponatremia. Occasionally this does make her stomach upset and this actually may be contributing to her ongoing diarrhea. Did advise the daughter that she can mix Gatorade with water  Final Clinical Impressions(s) / ED Diagnoses   Final diagnoses:  Dehydration  Diarrhea, unspecified type  Hypokalemia    New Prescriptions New Prescriptions   No medications on file     Malvin Johns, MD 10/11/16 1251

## 2016-10-11 NOTE — ED Notes (Signed)
Pt. Ambulated in hall well. 

## 2016-10-11 NOTE — ED Triage Notes (Signed)
[facility] states the pt is from home and complains of loose stools that started at 0100 this am with nausea, approximately 4 bouts of diarrhea. EMS reports the pt was hospitalized for same approximately 1 week ago. EMS states family feels the pts electrolytes are altered. EMS reports vitals BP 120/73, P-70, R-16, 100%, CBG 101, and sinus on the monitor.

## 2016-10-11 NOTE — ED Notes (Signed)
Nurse currently starting IV and drawing labs. 

## 2016-10-12 ENCOUNTER — Emergency Department (HOSPITAL_COMMUNITY)
Admission: EM | Admit: 2016-10-12 | Discharge: 2016-10-13 | Disposition: A | Discharge status: Home / Self Care | Payer: Medicare Other | Attending: Emergency Medicine | Admitting: Emergency Medicine

## 2016-10-12 DIAGNOSIS — I10 Essential (primary) hypertension: Secondary | ICD-10-CM | POA: Diagnosis not present

## 2016-10-12 DIAGNOSIS — R197 Diarrhea, unspecified: Secondary | ICD-10-CM | POA: Diagnosis not present

## 2016-10-12 DIAGNOSIS — E871 Hypo-osmolality and hyponatremia: Secondary | ICD-10-CM

## 2016-10-12 DIAGNOSIS — Z79899 Other long term (current) drug therapy: Secondary | ICD-10-CM | POA: Insufficient documentation

## 2016-10-12 DIAGNOSIS — R531 Weakness: Secondary | ICD-10-CM | POA: Diagnosis present

## 2016-10-12 DIAGNOSIS — Z7982 Long term (current) use of aspirin: Secondary | ICD-10-CM | POA: Diagnosis not present

## 2016-10-12 DIAGNOSIS — Z8673 Personal history of transient ischemic attack (TIA), and cerebral infarction without residual deficits: Secondary | ICD-10-CM | POA: Diagnosis not present

## 2016-10-13 ENCOUNTER — Encounter (HOSPITAL_COMMUNITY): Payer: Self-pay | Admitting: Emergency Medicine

## 2016-10-13 DIAGNOSIS — R197 Diarrhea, unspecified: Secondary | ICD-10-CM | POA: Diagnosis not present

## 2016-10-13 LAB — COMPREHENSIVE METABOLIC PANEL
ALT: 19 U/L (ref 14–54)
AST: 35 U/L (ref 15–41)
Albumin: 3.3 g/dL — ABNORMAL LOW (ref 3.5–5.0)
Alkaline Phosphatase: 76 U/L (ref 38–126)
Anion gap: 8 (ref 5–15)
BUN: 12 mg/dL (ref 6–20)
CO2: 14 mmol/L — AB (ref 22–32)
CREATININE: 0.74 mg/dL (ref 0.44–1.00)
Calcium: 9.3 mg/dL (ref 8.9–10.3)
Chloride: 108 mmol/L (ref 101–111)
GFR calc Af Amer: >60 mL/min (ref >60)
GFR calc non Af Amer: >60 mL/min (ref >60)
Glucose, Bld: 95 mg/dL (ref 65–99)
Potassium: 3.9 mmol/L (ref 3.5–5.1)
SODIUM: 130 mmol/L — AB (ref 135–145)
Total Bilirubin: 1.1 mg/dL (ref 0.3–1.2)
Total Protein: 6.8 g/dL (ref 6.5–8.1)

## 2016-10-13 LAB — CBC WITH DIFFERENTIAL/PLATELET
BASOS ABS: 0 10*3/uL (ref 0.0–0.1)
BASOS PCT: 0 %
EOS ABS: 0 10*3/uL (ref 0.0–0.7)
EOS PCT: 0 %
HCT: 37.2 % (ref 36.0–46.0)
Hemoglobin: 12.7 g/dL (ref 12.0–15.0)
Lymphocytes Relative: 10 %
Lymphs Abs: 0.8 10*3/uL (ref 0.7–4.0)
MCH: 30 pg (ref 26.0–34.0)
MCHC: 34.1 g/dL (ref 30.0–36.0)
MCV: 87.7 fL (ref 78.0–100.0)
Monocytes Absolute: 0.7 10*3/uL (ref 0.1–1.0)
Monocytes Relative: 8 %
Neutro Abs: 7 10*3/uL (ref 1.7–7.7)
Neutrophils Relative %: 82 %
PLATELETS: 273 10*3/uL (ref 150–400)
RBC: 4.24 MIL/uL (ref 3.87–5.11)
RDW: 13.2 % (ref 11.5–15.5)
WBC: 8.5 10*3/uL (ref 4.0–10.5)

## 2016-10-13 LAB — I-STAT VENOUS BLOOD GAS, ED
Acid-base deficit: 11 mmol/L — ABNORMAL HIGH (ref 0.0–2.0)
BICARBONATE: 15.5 mmol/L — AB (ref 20.0–28.0)
O2 Saturation: 60 %
TCO2: 17 mmol/L (ref 0–100)
pCO2, Ven: 36.5 mmHg — ABNORMAL LOW (ref 44.0–60.0)
pH, Ven: 7.238 — ABNORMAL LOW (ref 7.250–7.430)
pO2, Ven: 37 mmHg (ref 32.0–45.0)

## 2016-10-13 LAB — MAGNESIUM: Magnesium: 1.9 mg/dL (ref 1.7–2.4)

## 2016-10-13 MED ORDER — SODIUM CHLORIDE 0.9 % IV SOLN
Freq: Once | INTRAVENOUS | Status: AC
  Administered 2016-10-13: 125 mL/h via INTRAVENOUS

## 2016-10-13 MED ORDER — ONDANSETRON HCL 4 MG/2ML IJ SOLN
4.0000 mg | Freq: Once | INTRAMUSCULAR | Status: AC
  Administered 2016-10-13: 4 mg via INTRAVENOUS
  Filled 2016-10-13: qty 2

## 2016-10-13 MED ORDER — SODIUM CHLORIDE 0.9 % IV BOLUS (SEPSIS)
500.0000 mL | Freq: Once | INTRAVENOUS | Status: AC
  Administered 2016-10-13: 500 mL via INTRAVENOUS

## 2016-10-13 NOTE — Discharge Instructions (Signed)
It was my pleasure taking care of you today!   Please call your primary physician to schedule a follow up appointment for further discussion of today's hospital visit.   Return to ER for new or worsening symptoms, any additional concerns.

## 2016-10-13 NOTE — ED Triage Notes (Signed)
Pt presents with weakness r/t diarrhea and nausea. States diarrhea reoccurred after being seen in ED yesterday. C/o intermittent ABD pain. Currently not in pain

## 2016-10-13 NOTE — ED Notes (Signed)
Ward, PA at bedside

## 2016-10-13 NOTE — ED Provider Notes (Signed)
[facility] DEPT Provider Note   CSN: [id number] Arrival date & time: 10/12/16  2356    History   Chief Complaint Chief Complaint  Patient presents with  . Weakness    HPI Sydney [name] is a 83 y.o. female.  The history is provided by the patient, medical records and a friend. No language interpreter was used.   Sydney [name] is a 83 y.o. female  with a PMH as listed below who presents to the Emergency Department with daughter for diarrhea. Daughter notes that she was seen in ER two days ago for similar. At that time, patient was in the ER for 6 hours and did not have a bowel movement. Daughter states that work up looked good and patient felt better, therefore was discharged home. She was doing okay yesterday, but at 9:00 PM diarrhea returned. She has had 5 loose non-bloody stools since 9:00 PM and patient states that she had two semi-formed stools earlier today. No meds taken prior to arrival for symptoms. Associated with nausea and lower abdominal pain. No vomiting or fevers. No chest pain or shortness of breath. Hx of c. Diff in the past, but had negative testing approx. 8 days ago. Negative GI PCR panel as well.    Past Medical History:  Diagnosis Date  . Arthritis    "left hand" (02/17/2013)  . Glaucoma   . High cholesterol    "on RX years ago" (02/17/2013)  . Hypertension   . Meningioma ([place]) 05/01/2015  . Osteoporosis 02/2014   T score -2.5  followed by Dr. Lysle Rubens  . Palpitations    PACs, PVCs and short runs of atrial tachycardia on Holter monitoring  . TIA (transient ischemic attack) 1990's    Patient Active Problem List   Diagnosis Date Noted  . C. difficile colitis 10/05/2016  . Physical deconditioning 10/05/2016  . Recurrent Clostridium difficile diarrhea 10/05/2016  . Balance problem 10/05/2016  . History of transient ischemic attack (TIA) 10/05/2016  . Dehydration   . Hypokalemia 05/14/2016  . Anemia 05/14/2016  . Hyponatremia 05/14/2016  . Glaucoma  05/14/2016  . Enteritis 05/14/2016  . Meningioma ([place]) 05/01/2015  . 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 Diplopia 04/08/2015  . Leukopenia 04/08/2015  . Bradycardia 02/17/2013  . Occlusion and stenosis of carotid artery without mention of cerebral infarction 02/17/2013    Past Surgical History:  Procedure Laterality Date  . ANAL FISTULECTOMY  1970  . TONSILLECTOMY      OB History    Gravida Para Term Preterm AB Living   1 1 1     1    SAB TAB Ectopic Multiple Live Births                   Home Medications    Prior to Admission medications   Medication Sig Start Date End Date Taking? Authorizing Provider  aspirin (ECOTRIN LOW STRENGTH) 81 MG EC tablet Take 81 mg by mouth daily. Swallow whole.   Yes [provider]  cholecalciferol (VITAMIN D) 1000 UNITS tablet Take 1,000 Units by mouth daily.    Yes [provider]  clopidogrel (PLAVIX) 75 MG tablet Take 75 mg by mouth daily.   Yes [provider]  fluticasone (FLONASE) 50 MCG/ACT nasal spray Place 2 sprays into both nostrils daily as needed for allergies or rhinitis. 10/06/16  Yes Hongalgi, Lenis Dickinson, MD  folic acid (FOLVITE) 1 MG tablet Take 1 mg by mouth daily.   Yes [provider]  latanoprost (XALATAN) 0.005 % ophthalmic solution Place 1 drop into both eyes at bedtime.    Yes [provider]  vancomycin (VANCOCIN) 125 MG capsule Take 1 capsule (125 mg total) by mouth See admin instructions. Taken once a day every three days. yesterday was the 9/10 dose. Completes last dose on 10/07/2016. Patient not taking: Reported on 10/11/2016 10/06/16   Modena Jansky, MD    Family History Family History  Problem Relation Age of Onset  . Hypertension Father   . Heart attack Father   . Stroke Brother   . Stroke Maternal Grandmother     Social History Social History  Substance Use Topics  . Smoking status: Never Smoker  . Smokeless tobacco: Never Used  . Alcohol use No      Allergies   Demerol [meperidine]; Codeine; Sulfa antibiotics; and Tomato   Review of Systems Review of Systems  Gastrointestinal: Positive for diarrhea and nausea. Negative for abdominal pain, blood in stool and vomiting.  Neurological: Positive for weakness.  All other systems reviewed and are negative.    Physical Exam Updated Vital Signs BP 105/70   Pulse 79   Temp 98 F (36.7 C) (Oral)   Resp 18   SpO2 99%   Physical Exam  Constitutional: She is oriented to person, place, and time. She appears well-developed and well-nourished. No distress.  HENT:  Head: Normocephalic and atraumatic.  Cardiovascular: Normal rate, regular rhythm and normal heart sounds.   No murmur heard. Pulmonary/Chest: Effort normal and breath sounds normal. No respiratory distress.  Abdominal: Soft. She exhibits no distension. There is no tenderness.  Mild suprapubic and right lower quadrant tenderness with no rebound or guarding. No CVA tenderness.  Musculoskeletal: She exhibits no edema.  Neurological: She is alert and oriented to person, place, and time.  Skin: Skin is warm and dry.  Nursing note and vitals reviewed.    ED Treatments / Results  Labs (all labs ordered are listed, but only abnormal results are displayed) Labs Reviewed  COMPREHENSIVE METABOLIC PANEL - Abnormal; Notable for the following:       Result Value   Sodium 130 (*)    CO2 14 (*)    Albumin 3.3 (*)    All other components within normal limits  I-STAT VENOUS BLOOD GAS, ED - Abnormal; Notable for the following:    pH, Ven 7.238 (*)    pCO2, Ven 36.5 (*)    Bicarbonate 15.5 (*)    Acid-base deficit 11.0 (*)    All other components within normal limits  GASTROINTESTINAL PANEL BY PCR, STOOL (REPLACES STOOL CULTURE)  C DIFFICILE QUICK SCREEN W PCR REFLEX  CBC WITH DIFFERENTIAL/PLATELET  MAGNESIUM  URINALYSIS, ROUTINE W REFLEX MICROSCOPIC    EKG  EKG Interpretation None       Radiology No results  found.  Procedures Procedures (including critical care time)  Medications Ordered in ED Medications  sodium chloride 0.9 % bolus 500 mL (0 mLs Intravenous Stopped 10/13/16 0254)    Followed by  0.9 %  sodium chloride infusion (125 mL/hr Intravenous New Bag/Given 10/13/16 0047)  ondansetron (ZOFRAN) injection 4 mg (4 mg Intravenous Given 10/13/16 0047)     Initial Impression / Assessment and Plan / ED Course  I have reviewed the triage vital signs and the nursing notes.  Pertinent labs & imaging results that were available during my care of the patient were reviewed by me and considered in my medical decision making (see chart for details).  Sydney [name] is a 83 y.o. female who presents to ED for diarrhea which began today. Seen in ED on 6/17 for diarrhea as well. Reassuring work up at that time, was in ER for 6 hours and never had stool. Negative GI PCR and c. diff PCR 8 days ago. On exam, patient is afebrile, hemodynamically stable with no peritoneal signs. Baseline hyponatremia. Normal mag. Normal white count. CMP with co2 of 13. VBG with ph of 7.238. Patient hydrated in ER. After 4.5 hours of observation in ED, still no stool to sample. Patient feels better after IV hydration. Patient and daughter feel comfortable with discharge to home. Evaluation does not show pathology that would require ongoing emergent intervention or inpatient treatment. Spoke with patient and daughter about importance of PCP follow up as well as reasons to return to ER. All questions answered.   Patient seen by and discussed with Dr. Kathrynn Humble who agrees with treatment plan.    Final Clinical Impressions(s) / ED Diagnoses   Final diagnoses:  Diarrhea, unspecified type  Hyponatremia    New Prescriptions New Prescriptions   No medications on file     Ward, Ozella Almond, PA-C 10/13/16 3435    Varney Biles, MD 10/13/16 [phone]

## 2016-10-15 ENCOUNTER — Emergency Department (HOSPITAL_COMMUNITY): Payer: Medicare Other

## 2016-10-15 ENCOUNTER — Emergency Department (HOSPITAL_COMMUNITY)
Admission: EM | Admit: 2016-10-15 | Discharge: 2016-10-15 | Disposition: A | Discharge status: Home / Self Care | Payer: Medicare Other | Source: Home / Self Care | Attending: Emergency Medicine | Admitting: Emergency Medicine

## 2016-10-15 ENCOUNTER — Encounter (HOSPITAL_COMMUNITY): Payer: Self-pay | Admitting: Emergency Medicine

## 2016-10-15 DIAGNOSIS — R197 Diarrhea, unspecified: Secondary | ICD-10-CM | POA: Insufficient documentation

## 2016-10-15 DIAGNOSIS — Z7902 Long term (current) use of antithrombotics/antiplatelets: Secondary | ICD-10-CM | POA: Insufficient documentation

## 2016-10-15 DIAGNOSIS — Z8673 Personal history of transient ischemic attack (TIA), and cerebral infarction without residual deficits: Secondary | ICD-10-CM

## 2016-10-15 DIAGNOSIS — Z79899 Other long term (current) drug therapy: Secondary | ICD-10-CM | POA: Insufficient documentation

## 2016-10-15 DIAGNOSIS — Z7982 Long term (current) use of aspirin: Secondary | ICD-10-CM

## 2016-10-15 DIAGNOSIS — A0471 Enterocolitis due to Clostridium difficile, recurrent: Secondary | ICD-10-CM | POA: Diagnosis not present

## 2016-10-15 DIAGNOSIS — I951 Orthostatic hypotension: Secondary | ICD-10-CM | POA: Diagnosis not present

## 2016-10-15 DIAGNOSIS — Z885 Allergy status to narcotic agent status: Secondary | ICD-10-CM | POA: Insufficient documentation

## 2016-10-15 DIAGNOSIS — E860 Dehydration: Secondary | ICD-10-CM | POA: Insufficient documentation

## 2016-10-15 DIAGNOSIS — I10 Essential (primary) hypertension: Secondary | ICD-10-CM | POA: Insufficient documentation

## 2016-10-15 DIAGNOSIS — E876 Hypokalemia: Secondary | ICD-10-CM | POA: Insufficient documentation

## 2016-10-15 DIAGNOSIS — R531 Weakness: Secondary | ICD-10-CM | POA: Insufficient documentation

## 2016-10-15 LAB — BASIC METABOLIC PANEL
Anion gap: 7 (ref 5–15)
BUN: 8 mg/dL (ref 6–20)
CALCIUM: 9.3 mg/dL (ref 8.9–10.3)
CHLORIDE: 106 mmol/L (ref 101–111)
CO2: 20 mmol/L — AB (ref 22–32)
Creatinine, Ser: 0.65 mg/dL (ref 0.44–1.00)
GFR calc non Af Amer: >60 mL/min (ref >60)
Glucose, Bld: 100 mg/dL — ABNORMAL HIGH (ref 65–99)
Potassium: 3 mmol/L — ABNORMAL LOW (ref 3.5–5.1)
Sodium: 133 mmol/L — ABNORMAL LOW (ref 135–145)

## 2016-10-15 LAB — CBC
HCT: 36.7 % (ref 36.0–46.0)
Hemoglobin: 12.5 g/dL (ref 12.0–15.0)
MCH: 29.4 pg (ref 26.0–34.0)
MCHC: 34.1 g/dL (ref 30.0–36.0)
MCV: 86.4 fL (ref 78.0–100.0)
Platelets: 288 10*3/uL (ref 150–400)
RBC: 4.25 MIL/uL (ref 3.87–5.11)
RDW: 13.1 % (ref 11.5–15.5)
WBC: 5 10*3/uL (ref 4.0–10.5)

## 2016-10-15 LAB — URINALYSIS, ROUTINE W REFLEX MICROSCOPIC
Bilirubin Urine: NEGATIVE
Glucose, UA: NEGATIVE mg/dL
HGB URINE DIPSTICK: NEGATIVE
Ketones, ur: NEGATIVE mg/dL
Leukocytes, UA: NEGATIVE
Nitrite: NEGATIVE
Protein, ur: NEGATIVE mg/dL
SPECIFIC GRAVITY, URINE: 1.003 — AB (ref 1.005–1.030)
pH: 6 (ref 5.0–8.0)

## 2016-10-15 MED ORDER — POTASSIUM CHLORIDE CRYS ER 20 MEQ PO TBCR
40.0000 meq | EXTENDED_RELEASE_TABLET | Freq: Once | ORAL | Status: AC
  Administered 2016-10-15: 40 meq via ORAL
  Filled 2016-10-15: qty 2

## 2016-10-15 MED ORDER — ONDANSETRON HCL 4 MG/2ML IJ SOLN
4.0000 mg | Freq: Once | INTRAMUSCULAR | Status: AC
  Administered 2016-10-15: 4 mg via INTRAVENOUS
  Filled 2016-10-15: qty 2

## 2016-10-15 MED ORDER — SODIUM CHLORIDE 0.9 % IV BOLUS (SEPSIS)
1000.0000 mL | Freq: Once | INTRAVENOUS | Status: AC
  Administered 2016-10-15: 1000 mL via INTRAVENOUS

## 2016-10-15 MED ORDER — SODIUM CHLORIDE 0.9 % IV BOLUS (SEPSIS)
500.0000 mL | Freq: Once | INTRAVENOUS | Status: AC
  Administered 2016-10-15: 500 mL via INTRAVENOUS

## 2016-10-15 NOTE — ED Notes (Signed)
Pt to xray- pt refused in and out catheterization.

## 2016-10-15 NOTE — Discharge Instructions (Signed)
It was our pleasure to provide your ER care today - we hope that you feel better.  Rest. Drink plenty of fluids.   Follow up with your doctor in the next day for recheck - call office this afternoon to schedule follow up appointment, and to check on when results of stool tests will be back.   From today's lab tests, your potassium level is low (3) - eat plenty of fruits and vegetables, and follow up with your doctor in 1 week.  Return to ER if worse, new symptoms, fevers, worsening or severe abdominal pain, persistent vomiting, other concern.

## 2016-10-15 NOTE — ED Triage Notes (Addendum)
Pt states she has been having diarrhea since yesterday and feels weak all over. Pt has had some nausea. Pt also complaining of abd pain. Pt states she has had some balance issues for over a week and also says her sodium was "off" when she was seen at the hospital. Denies weakness on one side or other- just generalized weakness.

## 2016-10-15 NOTE — ED Provider Notes (Signed)
[facility] DEPT Provider Note   CSN: [id number] Arrival date & time: 10/15/16  0815     History   Chief Complaint Chief Complaint  Patient presents with  . Weakness  . Diarrhea    HPI Sydney [name] is a 83 y.o. female.  Patient c/o diarrhea for the past several days. 7 loose to watery stools yesterday, 4 so far today. No bloody bms. No abd pain or nv. No fever or chills. No dysuria or gu c/o. Decreased appetite. Symptoms moderate, persistent, worse today.  Pts family indicates turned in stool sample yesterday to their pcp office.    The history is provided by the patient and a relative.  Weakness  Pertinent negatives include no shortness of breath, no chest pain, no vomiting, no confusion and no headaches.  Diarrhea   Pertinent negatives include no abdominal pain, no vomiting, no chills, no headaches and no cough.    Past Medical History:  Diagnosis Date  . Arthritis    "left hand" (02/17/2013)  . Glaucoma   . High cholesterol    "on RX years ago" (02/17/2013)  . Hypertension   . Meningioma ([place]) 05/01/2015  . Osteoporosis 02/2014   T score -2.5  followed by Dr. Lysle Rubens  . Palpitations    PACs, PVCs and short runs of atrial tachycardia on Holter monitoring  . TIA (transient ischemic attack) 1990's    Patient Active Problem List   Diagnosis Date Noted  . C. difficile colitis 10/05/2016  . Physical deconditioning 10/05/2016  . Recurrent Clostridium difficile diarrhea 10/05/2016  . Balance problem 10/05/2016  . History of transient ischemic attack (TIA) 10/05/2016  . Dehydration   . Hypokalemia 05/14/2016  . Anemia 05/14/2016  . Hyponatremia 05/14/2016  . Glaucoma 05/14/2016  . Enteritis 05/14/2016  . Meningioma ([place]) 05/01/2015  . 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 Diplopia 04/08/2015  . Leukopenia 04/08/2015  . Bradycardia 02/17/2013  . Occlusion and stenosis of carotid artery without mention of cerebral infarction 02/17/2013    Past Surgical  History:  Procedure Laterality Date  . ANAL FISTULECTOMY  1970  . TONSILLECTOMY      OB History    Gravida Para Term Preterm AB Living   1 1 1     1    SAB TAB Ectopic Multiple Live Births                   Home Medications    Prior to Admission medications   Medication Sig Start Date End Date Taking? Authorizing Provider  aspirin (ECOTRIN LOW STRENGTH) 81 MG EC tablet Take 81 mg by mouth daily. Swallow whole.    [provider]  cholecalciferol (VITAMIN D) 1000 UNITS tablet Take 1,000 Units by mouth daily.     [provider]  clopidogrel (PLAVIX) 75 MG tablet Take 75 mg by mouth daily.    [provider]  fluticasone (FLONASE) 50 MCG/ACT nasal spray Place 2 sprays into both nostrils daily as needed for allergies or rhinitis. 10/06/16   Hongalgi, Lenis Dickinson, MD  folic acid (FOLVITE) 1 MG tablet Take 1 mg by mouth daily.    [provider]  latanoprost (XALATAN) 0.005 % ophthalmic solution Place 1 drop into both eyes at bedtime.     [provider]  vancomycin (VANCOCIN) 125 MG capsule Take 1 capsule (125 mg total) by mouth See admin instructions. Taken once a day every three days. yesterday was the 9/10 dose. Completes last dose on 10/07/2016. Patient not  taking: Reported on 10/11/2016 10/06/16   Modena Jansky, MD    Family History Family History  Problem Relation Age of Onset  . Hypertension Father   . Heart attack Father   . Stroke Brother   . Stroke Maternal Grandmother     Social History Social History  Substance Use Topics  . Smoking status: Never Smoker  . Smokeless tobacco: Never Used  . Alcohol use No     Allergies   Demerol [meperidine]; Codeine; Sulfa antibiotics; and Tomato   Review of Systems Review of Systems  Constitutional: Negative for chills and fever.  HENT: Negative for sore throat.   Eyes: Negative for redness.  Respiratory: Negative for cough and shortness of breath.   Cardiovascular: Negative for  chest pain.  Gastrointestinal: Positive for diarrhea. Negative for abdominal pain and vomiting.  Genitourinary: Negative for dysuria and flank pain.  Musculoskeletal: Negative for back pain.  Skin: Negative for rash.  Neurological: Positive for weakness. Negative for headaches.  Hematological: Does not bruise/bleed easily.  Psychiatric/Behavioral: Negative for confusion.     Physical Exam Updated Vital Signs BP 119/70 (BP Location: Right Arm)   Pulse 65   Temp 97.9 F (36.6 C) (Oral)   Resp 17   Ht 1.6 m (5\' 3" )   Wt 39.9 kg (88 lb)   SpO2 99%   BMI 15.59 kg/m   Physical Exam  Constitutional: She appears well-developed and well-nourished. No distress.  HENT:  Mouth/Throat: Oropharynx is clear and moist.  Eyes: Conjunctivae are normal. No scleral icterus.  Neck: Neck supple. No tracheal deviation present.  Cardiovascular: Normal rate, regular rhythm, normal heart sounds and intact distal pulses.  Exam reveals no gallop and no friction rub.   No murmur heard. Pulmonary/Chest: Effort normal and breath sounds normal. No respiratory distress.  Abdominal: Soft. Normal appearance and bowel sounds are normal. She exhibits no distension and no mass. There is no tenderness. There is no rebound and no guarding.  Genitourinary:  Genitourinary Comments: No cva tenderness  Musculoskeletal: She exhibits no edema.  Neurological: She is alert.  Skin: Skin is warm and dry. No rash noted. She is not diaphoretic.  Psychiatric: She has a normal mood and affect.  Nursing note and vitals reviewed.    ED Treatments / Results  Labs (all labs ordered are listed, but only abnormal results are displayed)   Results for orders placed or performed during the hospital encounter of 38/93/73  Basic metabolic panel  Result Value Ref Range   Sodium 133 (L) 135 - 145 mmol/L   Potassium 3.0 (L) 3.5 - 5.1 mmol/L   Chloride 106 101 - 111 mmol/L   CO2 20 (L) 22 - 32 mmol/L   Glucose, Bld 100 (H) 65 -  99 mg/dL   BUN 8 6 - 20 mg/dL   Creatinine, Ser 0.65 0.44 - 1.00 mg/dL   Calcium 9.3 8.9 - 10.3 mg/dL   GFR calc non Af Amer >60 >60 mL/min   GFR calc Af Amer >60 >60 mL/min   Anion gap 7 5 - 15  CBC  Result Value Ref Range   WBC 5.0 4.0 - 10.5 K/uL   RBC 4.25 3.87 - 5.11 MIL/uL   Hemoglobin 12.5 12.0 - 15.0 g/dL   HCT 36.7 36.0 - 46.0 %   MCV 86.4 78.0 - 100.0 fL   MCH 29.4 26.0 - 34.0 pg   MCHC 34.1 30.0 - 36.0 g/dL   RDW 13.1 11.5 - 15.5 %  Platelets 288 150 - 400 K/uL  Urinalysis, Routine w reflex microscopic  Result Value Ref Range   Color, Urine STRAW (A) YELLOW   APPearance CLEAR CLEAR   Specific Gravity, Urine 1.003 (L) 1.005 - 1.030   pH 6.0 5.0 - 8.0   Glucose, UA NEGATIVE NEGATIVE mg/dL   Hgb urine dipstick NEGATIVE NEGATIVE   Bilirubin Urine NEGATIVE NEGATIVE   Ketones, ur NEGATIVE NEGATIVE mg/dL   Protein, ur NEGATIVE NEGATIVE mg/dL   Nitrite NEGATIVE NEGATIVE   Leukocytes, UA NEGATIVE NEGATIVE     ED ECG REPORT   Date: 10/15/2016  Rate: 61  Rhythm: normal sinus rhythm  QRS Axis: normal  Intervals: normal  ST/T Wave abnormalities: nonspecific ST changes  Conduction Disutrbances:none  Narrative Interpretation:   Old EKG Reviewed: none available  I have personally reviewed the EKG tracing    Radiology No results found.  Procedures Procedures (including critical care time)  Medications Ordered in ED Medications - No data to display   Initial Impression / Assessment and Plan / ED Course  I have reviewed the triage vital signs and the nursing notes.  Pertinent labs & imaging results that were available during my care of the patient were reviewed by me and considered in my medical decision making (see chart for details).  Iv ns.   Reviewed nursing notes and prior charts for additional history.   Recent c diff and stool panel negative.  Iv ns bolus.   Additional ns iv, po fluids.  Pt tolerating po fluids well.  abd is soft nt.  kcl  po.  Patient to f/u closely w her pcp re labs/stool test sent yesterday afternoon, and for recheck.  Family also asks for gi referral re recurrent gi symptoms.    Final Clinical Impressions(s) / ED Diagnoses   Final diagnoses:  None    New Prescriptions New Prescriptions   No medications on file     Lajean Saver, MD 10/15/16 1224

## 2016-10-17 ENCOUNTER — Inpatient Hospital Stay (HOSPITAL_COMMUNITY)
Admission: EM | Admit: 2016-10-17 | Discharge: 2016-10-21 | DRG: 371 | Disposition: A | Discharge status: Home Health | Payer: Medicare Other | Attending: Internal Medicine | Admitting: Internal Medicine

## 2016-10-17 ENCOUNTER — Inpatient Hospital Stay (HOSPITAL_COMMUNITY): Payer: Medicare Other

## 2016-10-17 ENCOUNTER — Encounter (HOSPITAL_COMMUNITY): Payer: Self-pay | Admitting: *Deleted

## 2016-10-17 DIAGNOSIS — R197 Diarrhea, unspecified: Secondary | ICD-10-CM | POA: Diagnosis not present

## 2016-10-17 DIAGNOSIS — Z8249 Family history of ischemic heart disease and other diseases of the circulatory system: Secondary | ICD-10-CM

## 2016-10-17 DIAGNOSIS — E785 Hyperlipidemia, unspecified: Secondary | ICD-10-CM | POA: Diagnosis present

## 2016-10-17 DIAGNOSIS — A09 Infectious gastroenteritis and colitis, unspecified: Secondary | ICD-10-CM | POA: Diagnosis not present

## 2016-10-17 DIAGNOSIS — Z882 Allergy status to sulfonamides status: Secondary | ICD-10-CM

## 2016-10-17 DIAGNOSIS — Z888 Allergy status to other drugs, medicaments and biological substances status: Secondary | ICD-10-CM

## 2016-10-17 DIAGNOSIS — E871 Hypo-osmolality and hyponatremia: Secondary | ICD-10-CM | POA: Diagnosis present

## 2016-10-17 DIAGNOSIS — E860 Dehydration: Secondary | ICD-10-CM | POA: Diagnosis present

## 2016-10-17 DIAGNOSIS — E7800 Pure hypercholesterolemia, unspecified: Secondary | ICD-10-CM | POA: Diagnosis present

## 2016-10-17 DIAGNOSIS — Z8673 Personal history of transient ischemic attack (TIA), and cerebral infarction without residual deficits: Secondary | ICD-10-CM | POA: Diagnosis not present

## 2016-10-17 DIAGNOSIS — E872 Acidosis: Secondary | ICD-10-CM | POA: Diagnosis present

## 2016-10-17 DIAGNOSIS — Z681 Body mass index (BMI) 19 or less, adult: Secondary | ICD-10-CM

## 2016-10-17 DIAGNOSIS — A0471 Enterocolitis due to Clostridium difficile, recurrent: Principal | ICD-10-CM

## 2016-10-17 DIAGNOSIS — Z79899 Other long term (current) drug therapy: Secondary | ICD-10-CM

## 2016-10-17 DIAGNOSIS — A0472 Enterocolitis due to Clostridium difficile, not specified as recurrent: Secondary | ICD-10-CM | POA: Diagnosis not present

## 2016-10-17 DIAGNOSIS — Z7982 Long term (current) use of aspirin: Secondary | ICD-10-CM

## 2016-10-17 DIAGNOSIS — Z7902 Long term (current) use of antithrombotics/antiplatelets: Secondary | ICD-10-CM

## 2016-10-17 DIAGNOSIS — Z91018 Allergy to other foods: Secondary | ICD-10-CM

## 2016-10-17 DIAGNOSIS — E43 Unspecified severe protein-calorie malnutrition: Secondary | ICD-10-CM | POA: Diagnosis present

## 2016-10-17 DIAGNOSIS — E876 Hypokalemia: Secondary | ICD-10-CM | POA: Diagnosis present

## 2016-10-17 DIAGNOSIS — I10 Essential (primary) hypertension: Secondary | ICD-10-CM | POA: Diagnosis present

## 2016-10-17 DIAGNOSIS — E46 Unspecified protein-calorie malnutrition: Secondary | ICD-10-CM | POA: Diagnosis not present

## 2016-10-17 DIAGNOSIS — Z823 Family history of stroke: Secondary | ICD-10-CM | POA: Diagnosis not present

## 2016-10-17 DIAGNOSIS — H409 Unspecified glaucoma: Secondary | ICD-10-CM | POA: Diagnosis present

## 2016-10-17 DIAGNOSIS — M1990 Unspecified osteoarthritis, unspecified site: Secondary | ICD-10-CM | POA: Diagnosis present

## 2016-10-17 DIAGNOSIS — M810 Age-related osteoporosis without current pathological fracture: Secondary | ICD-10-CM | POA: Diagnosis present

## 2016-10-17 DIAGNOSIS — I951 Orthostatic hypotension: Secondary | ICD-10-CM | POA: Diagnosis present

## 2016-10-17 DIAGNOSIS — Z885 Allergy status to narcotic agent status: Secondary | ICD-10-CM | POA: Diagnosis not present

## 2016-10-17 DIAGNOSIS — Z8619 Personal history of other infectious and parasitic diseases: Secondary | ICD-10-CM | POA: Diagnosis present

## 2016-10-17 LAB — BASIC METABOLIC PANEL
ANION GAP: 9 (ref 5–15)
BUN: 6 mg/dL (ref 6–20)
CALCIUM: 8.9 mg/dL (ref 8.9–10.3)
CO2: 17 mmol/L — ABNORMAL LOW (ref 22–32)
CREATININE: 0.67 mg/dL (ref 0.44–1.00)
Chloride: 107 mmol/L (ref 101–111)
Glucose, Bld: 61 mg/dL — ABNORMAL LOW (ref 65–99)
Potassium: 3.1 mmol/L — ABNORMAL LOW (ref 3.5–5.1)
SODIUM: 133 mmol/L — AB (ref 135–145)

## 2016-10-17 LAB — GASTROINTESTINAL PANEL BY PCR, STOOL (REPLACES STOOL CULTURE)

## 2016-10-17 LAB — CBC
HEMATOCRIT: 34.3 % — AB (ref 36.0–46.0)
Hemoglobin: 12 g/dL (ref 12.0–15.0)
MCH: 29.9 pg (ref 26.0–34.0)
MCHC: 35 g/dL (ref 30.0–36.0)
MCV: 85.5 fL (ref 78.0–100.0)
Platelets: 247 10*3/uL (ref 150–400)
RBC: 4.01 MIL/uL (ref 3.87–5.11)
RDW: 13 % (ref 11.5–15.5)
WBC: 5.6 10*3/uL (ref 4.0–10.5)

## 2016-10-17 LAB — CLOSTRIDIUM DIFFICILE BY PCR: CDIFFPCR: POSITIVE — AB

## 2016-10-17 LAB — MAGNESIUM: Magnesium: 1.6 mg/dL — ABNORMAL LOW (ref 1.7–2.4)

## 2016-10-17 LAB — C DIFFICILE QUICK SCREEN W PCR REFLEX
C DIFFICILE (CDIFF) TOXIN: NEGATIVE
C DIFFICLE (CDIFF) ANTIGEN: POSITIVE — AB

## 2016-10-17 LAB — PHOSPHORUS: PHOSPHORUS: 2.5 mg/dL (ref 2.5–4.6)

## 2016-10-17 MED ORDER — SODIUM CHLORIDE 0.9 % IV BOLUS (SEPSIS)
500.0000 mL | Freq: Once | INTRAVENOUS | Status: AC
  Administered 2016-10-17: 500 mL via INTRAVENOUS

## 2016-10-17 MED ORDER — ACETAMINOPHEN 325 MG PO TABS: 650.0000 mg | ORAL_TABLET | Freq: Four times a day (QID) | ORAL | Status: DC | PRN

## 2016-10-17 MED ORDER — VANCOMYCIN 50 MG/ML ORAL SOLUTION
125.0000 mg | Freq: Four times a day (QID) | ORAL | Status: DC
  Administered 2016-10-17: 125 mg via ORAL
  Filled 2016-10-17 (×2): qty 2.5

## 2016-10-17 MED ORDER — BOOST / RESOURCE BREEZE PO LIQD
1.0000 | Freq: Three times a day (TID) | ORAL | Status: DC
  Administered 2016-10-18: 1 via ORAL

## 2016-10-17 MED ORDER — ACETAMINOPHEN 650 MG RE SUPP: 650.0000 mg | Freq: Four times a day (QID) | RECTAL | Status: DC | PRN

## 2016-10-17 MED ORDER — VANCOMYCIN 50 MG/ML ORAL SOLUTION: 125.0000 mg | ORAL | Status: DC

## 2016-10-17 MED ORDER — FLUTICASONE PROPIONATE 50 MCG/ACT NA SUSP
2.0000 | Freq: Every day | NASAL | Status: DC | PRN
  Filled 2016-10-17: qty 16

## 2016-10-17 MED ORDER — POTASSIUM CHLORIDE IN NACL 20-0.9 MEQ/L-% IV SOLN
INTRAVENOUS | Status: DC
  Administered 2016-10-17 – 2016-10-19 (×5): via INTRAVENOUS
  Filled 2016-10-17 (×7): qty 1000

## 2016-10-17 MED ORDER — ENOXAPARIN SODIUM 40 MG/0.4ML ~~LOC~~ SOLN
40.0000 mg | SUBCUTANEOUS | Status: DC
  Administered 2016-10-17: 40 mg via SUBCUTANEOUS
  Filled 2016-10-17: qty 0.4

## 2016-10-17 MED ORDER — SACCHAROMYCES BOULARDII 250 MG PO CAPS
250.0000 mg | ORAL_CAPSULE | Freq: Two times a day (BID) | ORAL | Status: DC
  Administered 2016-10-17 – 2016-10-21 (×8): 250 mg via ORAL
  Filled 2016-10-17 (×8): qty 1

## 2016-10-17 MED ORDER — ASPIRIN EC 81 MG PO TBEC
81.0000 mg | DELAYED_RELEASE_TABLET | Freq: Every day | ORAL | Status: DC
  Administered 2016-10-17 – 2016-10-21 (×5): 81 mg via ORAL
  Filled 2016-10-17 (×7): qty 1

## 2016-10-17 MED ORDER — IOPAMIDOL (ISOVUE-300) INJECTION 61%
INTRAVENOUS | Status: AC
  Administered 2016-10-17: 100 mL via INTRAVENOUS
  Filled 2016-10-17: qty 100

## 2016-10-17 MED ORDER — LATANOPROST 0.005 % OP SOLN
1.0000 [drp] | Freq: Every day | OPHTHALMIC | Status: DC
  Administered 2016-10-17 – 2016-10-20 (×4): 1 [drp] via OPHTHALMIC
  Filled 2016-10-17: qty 2.5

## 2016-10-17 MED ORDER — VANCOMYCIN 50 MG/ML ORAL SOLUTION: 125.0000 mg | Freq: Two times a day (BID) | ORAL | Status: DC

## 2016-10-17 MED ORDER — POTASSIUM CHLORIDE CRYS ER 20 MEQ PO TBCR
40.0000 meq | EXTENDED_RELEASE_TABLET | Freq: Once | ORAL | Status: AC
Start: 2016-10-17 — End: 2016-10-17
  Administered 2016-10-17: 40 meq via ORAL
  Filled 2016-10-17: qty 2

## 2016-10-17 MED ORDER — ENSURE ENLIVE PO LIQD
237.0000 mL | Freq: Three times a day (TID) | ORAL | Status: DC
  Administered 2016-10-17 – 2016-10-18 (×3): 237 mL via ORAL

## 2016-10-17 MED ORDER — CLOPIDOGREL BISULFATE 75 MG PO TABS
75.0000 mg | ORAL_TABLET | Freq: Every day | ORAL | Status: DC
  Administered 2016-10-17 – 2016-10-21 (×5): 75 mg via ORAL
  Filled 2016-10-17 (×5): qty 1

## 2016-10-17 MED ORDER — SODIUM CHLORIDE 0.9 % IV SOLN
INTRAVENOUS | Status: DC
  Administered 2016-10-17: 11:00:00 via INTRAVENOUS

## 2016-10-17 MED ORDER — IOPAMIDOL (ISOVUE-300) INJECTION 61%
INTRAVENOUS | Status: AC
  Administered 2016-10-17: 30 mL
  Filled 2016-10-17: qty 50

## 2016-10-17 MED ORDER — VANCOMYCIN 50 MG/ML ORAL SOLUTION: 125.0000 mg | Freq: Every day | ORAL | Status: DC

## 2016-10-17 MED ORDER — FIDAXOMICIN 200 MG PO TABS
200.0000 mg | ORAL_TABLET | Freq: Two times a day (BID) | ORAL | Status: DC
  Administered 2016-10-18 – 2016-10-21 (×7): 200 mg via ORAL
  Filled 2016-10-17 (×9): qty 1

## 2016-10-17 MED ORDER — FOLIC ACID 1 MG PO TABS
1.0000 mg | ORAL_TABLET | Freq: Every day | ORAL | Status: DC
  Administered 2016-10-17 – 2016-10-21 (×5): 1 mg via ORAL
  Filled 2016-10-17 (×5): qty 1

## 2016-10-17 NOTE — ED Notes (Signed)
Pt ate lunch tray  

## 2016-10-17 NOTE — ED Notes (Signed)
Attempted report 

## 2016-10-17 NOTE — Progress Notes (Signed)
Report received from ED 

## 2016-10-17 NOTE — ED Provider Notes (Signed)
[facility] DEPT Provider Note   CSN: [id number] Arrival date & time: 10/17/16  0623     History   Chief Complaint Chief Complaint  Patient presents with  . Weakness    HPI Sydney [name] is a 83 y.o. female.  She presents for evaluation of persistent weakness associated with diarrhea, and frequent stooling, which seems to be aggravated by eating or drinking.  He is worsening despite being treated by her PCP, with antibiotics, and having had multiple evaluations.  She has been seen in the ER frequently, third visit now, and by GI yesterday, all within the last few days.  No documented fever.  Her daughter thinks that she has lost about 15 pounds over the last several weeks.  She was hospitalized several weeks ago.  Patient denies headache, chest pain or back pain.  There is been no blood in the diarrhea.  There are no other known modifying factors.  HPI  Past Medical History:  Diagnosis Date  . Arthritis    "left hand" (02/17/2013)  . Glaucoma   . High cholesterol    "on RX years ago" (02/17/2013)  . Hypertension   . Meningioma ([place]) 05/01/2015  . Osteoporosis 02/2014   T score -2.5  followed by Dr. Lysle Rubens  . Palpitations    PACs, PVCs and short runs of atrial tachycardia on Holter monitoring  . TIA (transient ischemic attack) 1990's    Patient Active Problem List   Diagnosis Date Noted  . C. difficile colitis 10/05/2016  . Physical deconditioning 10/05/2016  . Recurrent Clostridium difficile diarrhea 10/05/2016  . Balance problem 10/05/2016  . History of transient ischemic attack (TIA) 10/05/2016  . Dehydration   . Hypokalemia 05/14/2016  . Anemia 05/14/2016  . Hyponatremia 05/14/2016  . Glaucoma 05/14/2016  . Enteritis 05/14/2016  . Meningioma ([place]) 05/01/2015  . 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 Diplopia 04/08/2015  . Leukopenia 04/08/2015  . Bradycardia 02/17/2013  . Occlusion and stenosis of carotid artery without mention of cerebral infarction  02/17/2013    Past Surgical History:  Procedure Laterality Date  . ANAL FISTULECTOMY  1970  . TONSILLECTOMY      OB History    Gravida Para Term Preterm AB Living   1 1 1     1    SAB TAB Ectopic Multiple Live Births                   Home Medications    Prior to Admission medications   Medication Sig Start Date End Date Taking? Authorizing Provider  aspirin (ECOTRIN LOW STRENGTH) 81 MG EC tablet Take 81 mg by mouth daily. Swallow whole.   Yes [provider]  clopidogrel (PLAVIX) 75 MG tablet Take 75 mg by mouth daily.   Yes [provider]  folic acid (FOLVITE) 1 MG tablet Take 1 mg by mouth daily.   Yes [provider]  latanoprost (XALATAN) 0.005 % ophthalmic solution Place 1 drop into both eyes at bedtime.    Yes [provider]  saccharomyces boulardii (FLORASTOR) 250 MG capsule Take 250 mg by mouth 2 (two) times daily.   Yes [provider]  vancomycin (VANCOCIN) 125 MG capsule Take 1 capsule (125 mg total) by mouth See admin instructions. Taken once a day every three days. yesterday was the 9/10 dose. Completes last dose on 10/07/2016. Patient taking differently: Take 375 mg by mouth See admin instructions. Take 375 mg by mouth daily for 10 days 10/06/16  Yes  Modena Jansky, MD  fluticasone (FLONASE) 50 MCG/ACT nasal spray Place 2 sprays into both nostrils daily as needed for allergies or rhinitis. 10/06/16   Hongalgi, Lenis Dickinson, MD    Family History Family History  Problem Relation Age of Onset  . Hypertension Father   . Heart attack Father   . Stroke Brother   . Stroke Maternal Grandmother     Social History Social History  Substance Use Topics  . Smoking status: Never Smoker  . Smokeless tobacco: Never Used  . Alcohol use No     Allergies   Demerol [meperidine]; Codeine; Sulfa antibiotics; and Tomato   Review of Systems Review of Systems  All other systems reviewed and are negative.    Physical  Exam Updated Vital Signs BP 130/71   Pulse 65   Temp 98.9 F (37.2 C) (Oral)   Resp 12   Wt 41.3 kg (91 lb)   SpO2 100%   BMI 16.12 kg/m   Physical Exam  Constitutional: She appears well-developed. No distress.  Elderly, frail.  She appears under nourished.  HENT:  Head: Normocephalic and atraumatic.  Eyes: Conjunctivae and EOM are normal. Pupils are equal, round, and reactive to light.  Neck: Normal range of motion and phonation normal. Neck supple.  Cardiovascular: Normal rate and regular rhythm.   Pulmonary/Chest: Effort normal and breath sounds normal. She exhibits no tenderness.  Abdominal: Soft. She exhibits no distension. There is no tenderness. There is no guarding.  Musculoskeletal: Normal range of motion.  Neurological: She is alert. No cranial nerve deficit. She exhibits normal muscle tone.  Skin: Skin is warm and dry.  Psychiatric: She has a normal mood and affect. Her behavior is normal.  Nursing note and vitals reviewed.    ED Treatments / Results  Labs (all labs ordered are listed, but only abnormal results are displayed) Labs Reviewed  CBC - Abnormal; Notable for the following:       Result Value   HCT 34.3 (*)    All other components within normal limits  BASIC METABOLIC PANEL - Abnormal; Notable for the following:    Sodium 133 (*)    Potassium 3.1 (*)    CO2 17 (*)    Glucose, Bld 61 (*)    All other components within normal limits    EKG  EKG Interpretation None       Radiology No results found.  Procedures Procedures (including critical care time)  Medications Ordered in ED Medications  0.9 %  sodium chloride infusion ( Intravenous New Bag/Given 10/17/16 1035)  sodium chloride 0.9 % bolus 500 mL (0 mLs Intravenous Stopped 10/17/16 1148)  potassium chloride SA (K-DUR,KLOR-CON) CR tablet 40 mEq (40 mEq Oral Given 10/17/16 1141)     Initial Impression / Assessment and Plan / ED Course  I have reviewed the triage vital signs and the  nursing notes.  Pertinent labs & imaging results that were available during my care of the patient were reviewed by me and considered in my medical decision making (see chart for details).  Clinical Course as of Oct 17 1249  Sat Oct 17, 2016  1057 The case was discussed with Dr. polite, on call for her physician.  The patient has an acute C. difficile enterocolitis, with toxin positive, and has been started on oral vancomycin to treat it.  [EW]  1118 Weight today 41.2 kg, down about 5-1/2 pounds from 10/05/16  [EW]    Clinical Course User Index [EW] Daleen Bo,  MD     Patient Vitals for the past 24 hrs:  BP Temp Temp src Pulse Resp SpO2 Weight  10/17/16 1200 130/71 - - 65 12 100 % -  10/17/16 1145 127/76 - - 81 16 99 % -  10/17/16 1100 138/62 - - 63 13 100 % -  10/17/16 1037 - - - - - - 41.3 kg (91 lb)  10/17/16 1030 122/65 - - 62 17 100 % -  10/17/16 0930 118/63 - - 61 18 100 % -  10/17/16 0900 120/60 - - (!) 59 14 100 % -  10/17/16 0800 114/67 - - (!) 59 18 98 % -  10/17/16 0747 102/68 98.9 F (37.2 C) Oral 80 16 100 % -    12:51 PM Reevaluation with update and discussion. After initial assessment and treatment, an updated evaluation reveals she has been able to tolerate some oral liquids, and oral potassium.  Findings discussed with patient and daughter, all questions answered. Abdulaziz Toman L   12:53 PM-Consult complete with hospitalist. Patient case explained and discussed.  She agrees to admit patient for further evaluation and treatment. Call ended at 41: 05  Final Clinical Impressions(s) / ED Diagnoses   Final diagnoses:  Clostridium difficile enterocolitis  Orthostatic hypotension  Dehydration  Hypokalemia    Enteritis, with C. difficile, and secondary hypokalemia with dehydration.  Patient is at risk for decompensation if discharged, therefore needs to be admitted for observation and treatment.  Nursing Notes Reviewed/ Care Coordinated Applicable Imaging  Reviewed Interpretation of Laboratory Data incorporated into ED treatment  Plan: Admit  New Prescriptions New Prescriptions   No medications on file     Daleen Bo, MD 10/17/16 1341

## 2016-10-17 NOTE — Progress Notes (Signed)
Pt arrived to the unit at 1730 via bed. 

## 2016-10-17 NOTE — Progress Notes (Signed)
Sydney [name] is a 83 y.o. female patient admitted from ED awake, alert - oriented  X 4 - no acute distress noted.  VSS - Blood pressure (!) 141/66, pulse (!) 58, temperature 97.7 F (36.5 C), temperature source Oral, resp. rate 16, height 5\' 3"  (1.6 m), weight 41.3 kg (91 lb), SpO2 100 %.    IV in place, occlusive dsg intact without redness.  Orientation to room, and floor completed with information packet given to patient/family.  Patient declined safety video at this time.  Admission INP armband ID verified with patient/family, and in place.   SR up x 2, fall assessment complete, with patient and family able to verbalize understanding of risk associated with falls, and verbalized understanding to call nsg before up out of bed.  Call light within reach, patient able to voice, and demonstrate understanding.  Skin, clean-dry- intact without evidence of bruising, or skin tears.   No evidence of skin break down noted on exam.     Will cont to eval and treat per MD orders.  Dorris Carnes, RN 10/17/2016 5:55 PM

## 2016-10-17 NOTE — Consult Note (Signed)
[facility] for Infectious Disease    Date of Admission:  10/17/2016          Reason for Consult: Recurrent C. difficile colitis    Referring Provider: [name] NP  Assessment: Even though her C. difficile toxin is negative today I would certainly agree with treating her as a probable relapse of C. difficile colitis. Fidaxomycin is a reasonable choice and is supported by current guidelines. [name] had been considering fecal microbiota transplant and this may be the ultimate solution.  Plan: 1. Agree with fidaxomycin 2. I will have my partner, Dr. Carlyle Basques, follow-up on Monday, 10/19/2016   Principal Problem:   Recurrent colitis due to Clostridium difficile Active Problems:   Hypertension   High cholesterol   Dehydration with hyponatremia   Acute hypokalemia   Protein calorie malnutrition ([place])   . iopamidol      . aspirin  81 mg Oral Daily  . clopidogrel  75 mg Oral Daily  . enoxaparin (LOVENOX) injection  40 mg Subcutaneous Q24H  . feeding supplement  1 Container Oral TID BM   Or  . feeding supplement (ENSURE ENLIVE)  237 mL Oral TID BM  . fidaxomicin  200 mg Oral BID  . folic acid  1 mg Oral Daily  . latanoprost  1 drop Both Eyes QHS  . saccharomyces boulardii  250 mg Oral BID    HPI: Sydney [name] is a 83 y.o. female who was diagnosed with C. difficile colitis in January. She received a 2 week course of vancomycin but relapsed and was treated with another slightly longer course of vancomycin. She had clinical relapses in March and April. She was seen by my partner, Dr. Carlyle Basques, on 08/20/2016. She was started on a long, tapering course of oral vancomycin. She was seen again by [name] on 09/24/2016. She was doing much better at that time. She completed her vancomycin taper on 10/07/2016. She was not having any diarrhea, her appetite is improved and she was gaining some of the weight she had lost. Her diarrhea started back about one  week ago leading to readmission today. Her C. difficile antigen is positive but toxin is negative.   Review of Systems: Review of Systems  Constitutional: Positive for malaise/fatigue and weight loss. Negative for chills, diaphoresis and fever.  Respiratory: Negative for cough, sputum production and shortness of breath.   Cardiovascular: Negative for chest pain.  Gastrointestinal: Positive for diarrhea and nausea. Negative for abdominal pain, heartburn and vomiting.  Skin: Negative for rash.  Neurological: Negative for dizziness and loss of consciousness.    Past Medical History:  Diagnosis Date  . Arthritis    "left hand" (02/17/2013)  . Glaucoma   . High cholesterol    "on RX years ago" (02/17/2013)  . Hypertension   . Meningioma ([place]) 05/01/2015  . Osteoporosis 02/2014   T score -2.5  followed by Dr. Lysle Rubens  . Palpitations    PACs, PVCs and short runs of atrial tachycardia on Holter monitoring  . TIA (transient ischemic attack) 1990's    Social History  Substance Use Topics  . Smoking status: Never Smoker  . Smokeless tobacco: Never Used  . Alcohol use No    Family History  Problem Relation Age of Onset  . Hypertension Father   . Heart attack Father   . Stroke Brother   . Stroke Maternal Grandmother    Allergies  Allergen Reactions  .  Demerol [Meperidine] Other (See Comments)    Excessive sweating, cramping  . Codeine Rash  . Sulfa Antibiotics Other (See Comments)    Reaction unknown  . Tomato Itching    OBJECTIVE: Blood pressure (!) 141/66, pulse (!) 58, temperature 97.7 F (36.5 C), temperature source Oral, resp. rate 16, height 5\' 3"  (1.6 m), weight 91 lb (41.3 kg), SpO2 100 %.  Physical Exam  Constitutional: She is oriented to person, place, and time.  She has a very thin, pleasant woman lying on a stretcher in the emergency department. She appears comfortable.  Cardiovascular: Normal rate and regular rhythm.   No murmur heard. Pulmonary/Chest:  Effort normal and breath sounds normal.  Abdominal: Soft. Bowel sounds are normal. She exhibits no distension. There is no tenderness.  Neurological: She is alert and oriented to person, place, and time.  Psychiatric: Mood and affect normal.    Lab Results Lab Results  Component Value Date   WBC 5.6 10/17/2016   HGB 12.0 10/17/2016   HCT 34.3 (L) 10/17/2016   MCV 85.5 10/17/2016   PLT 247 10/17/2016    Lab Results  Component Value Date   CREATININE 0.67 10/17/2016   BUN 6 10/17/2016   NA 133 (L) 10/17/2016   K 3.1 (L) 10/17/2016   CL 107 10/17/2016   CO2 17 (L) 10/17/2016    Lab Results  Component Value Date   ALT 19 10/13/2016   AST 35 10/13/2016   ALKPHOS 76 10/13/2016   BILITOT 1.1 10/13/2016     Microbiology: Recent Results (from the past 240 hour(s))  C difficile quick scan w PCR reflex     Status: Abnormal   Collection Time: 10/17/16  2:15 PM  Result Value Ref Range Status   C Diff antigen POSITIVE (A) NEGATIVE Final   C Diff toxin NEGATIVE NEGATIVE Final   C Diff interpretation Results are indeterminate. See PCR results.  Final  Clostridium Difficile by PCR     Status: Abnormal   Collection Time: 10/17/16  2:15 PM  Result Value Ref Range Status   Toxigenic C Difficile by pcr POSITIVE (A) NEGATIVE Final    Comment: Positive for toxigenic C. difficile with little to no toxin production. Only treat if clinical presentation suggests symptomatic illness.    Michel Bickers, MD [facility] [phone] pager   [phone] cell 10/17/2016, 5:45 PM

## 2016-10-17 NOTE — ED Notes (Signed)
Pt eating crackers and ginger ale ?

## 2016-10-17 NOTE — Progress Notes (Addendum)
Pharmacy Antibiotic Note  Addendum: Pharmacy now consulted to start fidaxomicin. C. Diff Ag is positive; however, toxin is negative. ID still consulted, but hasn't seen patient yet.   Plan Fidaxomicin 200mg  q12h x10 days F/u ID consult  Stark Klein, PharmD Clinical Pharmacy Resident [phone] (Pager) 10/17/2016 3:47 PM  ---------------------------------------------------------------------------------------------------------------------------------------------------------------------  Sydney [name] is a 83 y.o. female admitted on 10/17/2016 with diarrhea, recurrent C diff.  Pharmacy has been consulted for C diff management. ID consulted and awaiting recommendations. C diff pending.  Per ID clinic note 08/20/16: 04/2016 treated with oral vancomycin for 2 weeks 05/2016 treated with oral vancomycin for 4 weeks but unclear if regimen was taper 06/2016 treated with oral vancomycin 07/2016 treated with oral vancomycin taper with plan to try fidaxomicin +/- FMT if recurs   Plan: Have placed patient on vancomycin oral taper for recurrent C diff until ID sees patient  Weight: 91 lb (41.3 kg)  Temp (24hrs), Avg:98.9 F (37.2 C), Min:98.9 F (37.2 C), Max:98.9 F (37.2 C)   Recent Labs Lab 10/11/16 0654 10/13/16 0039 10/15/16 0827 10/17/16 1015  WBC 8.6 8.5 5.0 5.6  CREATININE 0.70 0.74 0.65 0.67    Estimated Creatinine Clearance: 34.7 mL/min (by C-G formula based on SCr of 0.67 mg/dL).    Allergies  Allergen Reactions  . Demerol [Meperidine] Other (See Comments)    Excessive sweating, cramping  . Codeine Rash  . Sulfa Antibiotics Other (See Comments)    Reaction unknown  . Tomato Itching     Thank you for allowing pharmacy to be a part of this patient's care.  Renold Genta, PharmD, BCPS Clinical Pharmacist Phone for today - [place] - [phone] 10/17/2016 2:08 PM

## 2016-10-17 NOTE — ED Triage Notes (Signed)
Brought to ED from home via [facility] for continued diarrhea. Pt was started on abx yesterday for c-diff.

## 2016-10-17 NOTE — H&P (Signed)
History and Physical    Sydney [name] IZT:[id number] DOB: March 13, 1931 DOA: 10/17/2016   PCP: [name], MD   Patient coming from/Resides with: Private residence  Chief Complaint: Recurrent diarrhea and weakness  HPI: Sydney [name] is a 83 y.o. female with medical history significant for hypertension, dyslipidemia, TIAs and recent issues with recurrent colitis and diarrhea secondary to C. difficile infection. Patient was occasionally diagnosed with C. difficile colitis in January 2018 and had been started on Flagyl by her primary care physician but did not tolerate this and was eventually started on oral vancomycin. She was hospitalized briefly in January due to the symptoms. She has had recurrent diarrhea and ER visits/hospitalizations for recurrent C. difficile colitis despite completing medications as prescribed. She is also being followed by [name] with ID in the outpatient setting. She was most recently hospitalized for generalized weakness, near syncopal episode and dehydration in the setting of continued diarrhea and subsequently discharged on 6/12. She was on a slow oral vancomycin taper during that hospitalization at the recommendation of ID. Both the C. difficile panel and GI pathogen panel PCR were negative for any infectious causes to her diarrhea and on date of discharge (24 hours later) she was reporting solid stools. Since that time she has returned to the ER on 6/17, 6/19 and 6/21 due to reported issues with diarrhea. She was seen by her PCP earlier last week and a stool sample was obtained and apparently was positive for both C. difficile antigen and toxin. She was resumed on vancomycin. Prior to obtaining a positive PCR result, the patient had been referred to Dr. Amedeo Plenty with gastroenterology who had planned on doing endoscopy but with a positive C. difficile test additional gastroenterology evaluation was deferred. ID has also discussed the possibility for fecal transplant  with the patient and her family. She returns to the ER today because of generalized weakness, abdominal cramping and watery stools with solid oral intake.  ED Course:  Vital Signs: BP 130/67   Pulse 78   Temp 98.9 F (37.2 C) (Oral)   Resp 14   Wt 41.3 kg (91 lb)   SpO2 100%   BMI 16.12 kg/m  Lab data: Sodium 133, potassium 3.1, chloride 107, CO2 17, glucose 61, BUN 6, creatinine 0.67, white count 5600 differential not obtained, hemoglobin 12, platelets 247,000, urinalysis unremarkable except for specific gravity 1.003. Medications and treatments: NS bolus 500 mL, potassium 40 mEq by mouth 1  Review of Systems:  In addition to the HPI above,  No Fever-chills, myalgias or other constitutional symptoms No Headache, changes with Vision or hearing, new weakness, tingling, numbness in any extremity, dizziness, dysarthria or word finding difficulty, gait disturbance or imbalance, tremors or seizure activity No problems swallowing food or Liquids, indigestion/reflux, choking or coughing while eating No Chest pain, Cough or Shortness of Breath, palpitations, orthopnea or DOE No Abdominal pain although occasional mild abdominal cramping prior to onset of watery diarrhea episod; no N/V, melena,hematochezia, dark tarry stools, constipation No dysuria, malodorous urine, hematuria or flank pain No new skin rashes, lesions, masses or bruises, No new joint pains, aches, swelling or redness No recent unintentional weight gain or loss No polyuria, polydypsia or polyphagia   Past Medical History:  Diagnosis Date  . Arthritis    "left hand" (02/17/2013)  . Glaucoma   . High cholesterol    "on RX years ago" (02/17/2013)  . Hypertension   . Meningioma ([place]) 05/01/2015  . Osteoporosis 02/2014   T  score -2.5  followed by Dr. Lysle Rubens  . Palpitations    PACs, PVCs and short runs of atrial tachycardia on Holter monitoring  . TIA (transient ischemic attack) 1990's    Past Surgical History:    Procedure Laterality Date  . ANAL FISTULECTOMY  1970  . TONSILLECTOMY      Social History   Social History  . Marital status: Widowed    Spouse name: N/A  . Number of children: 1  . Years of education: 14   Occupational History  . retired    Social History Main Topics  . Smoking status: Never Smoker  . Smokeless tobacco: Never Used  . Alcohol use No  . Drug use: No  . Sexual activity: No     Comment: 1st intercourse 83 yo-Fewer than 5 partners   Other Topics Concern  . Not on file   Social History Narrative   Patient does not drink caffeine.   Patient is right handed.     Mobility: Independent Work history: Not obtained   Allergies  Allergen Reactions  . Demerol [Meperidine] Other (See Comments)    Excessive sweating, cramping  . Codeine Rash  . Sulfa Antibiotics Other (See Comments)    Reaction unknown  . Tomato Itching    Family History  Problem Relation Age of Onset  . Hypertension Father   . Heart attack Father   . Stroke Brother   . Stroke Maternal Grandmother      Prior to Admission medications   Medication Sig Start Date End Date Taking? Authorizing Provider  aspirin (ECOTRIN LOW STRENGTH) 81 MG EC tablet Take 81 mg by mouth daily. Swallow whole.   Yes [provider]  clopidogrel (PLAVIX) 75 MG tablet Take 75 mg by mouth daily.   Yes [provider]  folic acid (FOLVITE) 1 MG tablet Take 1 mg by mouth daily.   Yes [provider]  latanoprost (XALATAN) 0.005 % ophthalmic solution Place 1 drop into both eyes at bedtime.    Yes [provider]  saccharomyces boulardii (FLORASTOR) 250 MG capsule Take 250 mg by mouth 2 (two) times daily.   Yes [provider]  vancomycin (VANCOCIN) 125 MG capsule Take 1 capsule (125 mg total) by mouth See admin instructions. Taken once a day every three days. yesterday was the 9/10 dose. Completes last dose on 10/07/2016. Patient taking differently: Take 375 mg by mouth  See admin instructions. Take 375 mg by mouth daily for 10 days 10/06/16  Yes Hongalgi, Lenis Dickinson, MD  fluticasone (FLONASE) 50 MCG/ACT nasal spray Place 2 sprays into both nostrils daily as needed for allergies or rhinitis. 10/06/16   Modena Jansky, MD    Physical Exam: Vitals:   10/17/16 1200 10/17/16 1230 10/17/16 1245 10/17/16 1300  BP: 130/71 129/63 122/65 130/67  Pulse: 65 64 68 78  Resp: 12 15 15 14   Temp:      TempSrc:      SpO2: 100% 100% 100% 100%  Weight:          Constitutional: NAD, calm, comfortable; Appears cachectic and underweight Eyes: PERRL, lids and conjunctivae normal ENMT: Mucous membranes are moist. Posterior pharynx clear of any exudate or lesions.Normal dentition.  Neck: normal, supple, no masses, no thyromegaly Respiratory: clear to auscultation bilaterally, no wheezing, no crackles. Normal respiratory effort. No accessory muscle use.  Cardiovascular: Regular rate and rhythm, no murmurs / rubs / gallops. No extremity edema. 2+ pedal pulses. No carotid bruits.  Abdomen: no  tenderness, no masses palpated. No hepatosplenomegaly. Bowel sounds positive.  Musculoskeletal: no clubbing / cyanosis. No joint deformity upper and lower extremities. Good ROM, no contractures. Normal muscle tone.  Skin: no rashes, lesions, ulcers. No induration Neurologic: CN 2-12 grossly intact. Sensation intact, DTR normal. Strength 5/5 x all 4 extremities.  Psychiatric: Normal judgment and insight. Alert and oriented x 3. Normal mood.    Labs on Admission: I have personally reviewed following labs and imaging studies  CBC:  Recent Labs Lab 10/11/16 0654 10/13/16 0039 10/15/16 0827 10/17/16 1015  WBC 8.6 8.5 5.0 5.6  NEUTROABS  --  7.0  --   --   HGB 12.5 12.7 12.5 12.0  HCT 36.9 37.2 36.7 34.3*  MCV 87.9 87.7 86.4 85.5  PLT 280 273 288 599   Basic Metabolic Panel:  Recent Labs Lab 10/11/16 0654 10/13/16 0039 10/15/16 0827 10/17/16 1015  NA 134* 130* 133* 133*  K  3.1* 3.9 3.0* 3.1*  CL 105 108 106 107  CO2 21* 14* 20* 17*  GLUCOSE 93 95 100* 61*  BUN 8 12 8 6   CREATININE 0.70 0.74 0.65 0.67  CALCIUM 9.2 9.3 9.3 8.9  MG  --  1.9  --   --    GFR: Estimated Creatinine Clearance: 34.7 mL/min (by C-G formula based on SCr of 0.67 mg/dL). Liver Function Tests:  Recent Labs Lab 10/13/16 0039  AST 35  ALT 19  ALKPHOS 76  BILITOT 1.1  PROT 6.8  ALBUMIN 3.3*   No results for input(s): LIPASE, AMYLASE in the last 168 hours. No results for input(s): AMMONIA in the last 168 hours. Coagulation Profile: No results for input(s): INR, PROTIME in the last 168 hours. Cardiac Enzymes: No results for input(s): CKTOTAL, CKMB, CKMBINDEX, TROPONINI in the last 168 hours. BNP (last 3 results) No results for input(s): PROBNP in the last 8760 hours. HbA1C: No results for input(s): HGBA1C in the last 72 hours. CBG:  Recent Labs Lab 10/11/16 0652  GLUCAP 81   Lipid Profile: No results for input(s): CHOL, HDL, LDLCALC, TRIG, CHOLHDL, LDLDIRECT in the last 72 hours. Thyroid Function Tests: No results for input(s): TSH, T4TOTAL, FREET4, T3FREE, THYROIDAB in the last 72 hours. Anemia Panel: No results for input(s): VITAMINB12, FOLATE, FERRITIN, TIBC, IRON, RETICCTPCT in the last 72 hours. Urine analysis:    Component Value Date/Time   COLORURINE STRAW (A) 10/15/2016 1151   APPEARANCEUR CLEAR 10/15/2016 1151   LABSPEC 1.003 (L) 10/15/2016 1151   PHURINE 6.0 10/15/2016 1151   GLUCOSEU NEGATIVE 10/15/2016 1151   HGBUR NEGATIVE 10/15/2016 1151   BILIRUBINUR NEGATIVE 10/15/2016 1151   KETONESUR NEGATIVE 10/15/2016 1151   PROTEINUR NEGATIVE 10/15/2016 1151   UROBILINOGEN 0.2 2020-02-813 1048   NITRITE NEGATIVE 10/15/2016 1151   LEUKOCYTESUR NEGATIVE 10/15/2016 1151   Sepsis Labs: @LABRCNTIP (procalcitonin:4,lacticidven:4) )No results found for this or any previous visit (from the past 240 hour(s)).   Radiological Exams on Admission: No results  found.   Assessment/Plan Principal Problem:   Recurrent colitis due to Clostridium difficile -Returns with recurrent symptomatic watery diarrhea with outpatient testing positive for C. difficile antigen and toxin -Pharmacy consult for appropriate C. difficile treatment (Vanco taper) -Rpt C diff PCR and GI pathogen panel PCR -Infectious disease consult -Consideration given to fecal transplant -Partial bowel rest with clear liquids and liquid protein supplementation avoiding solid food whenever possible -Continue florastor  Active Problems:   Dehydration with hyponatremia/ Acute hypokalemia/non AG acidosis -Patient presents with recurrent watery diarrhea with low  specific gravity suggestive of appropriate ADH response as well as a non-anion gap acidosis from GI losses -Replete potassium IV and oral -Rehydration with IV fluids and clear liquids as tolerated -Follow labs -Patient was also found to be orthostatic with a drop in systolic blood pressure from 113 supine to 82 standing    Hypertension -Orthostatic secondary to dehydration -Was not on antihypertensive medications prior to admission    Protein calorie malnutrition  -Family reports that prior to onset of recurrent C. difficile colitis baseline weight 108 lbs and today's weight 91 lbs -Nutrition consultation -Until colitis improves/postprandial watery diarrhea improved or resolved we will utilize clear liquids with liquid protein supplementation i.e. resource beverage and/or ensure beverage    High cholesterol/his of TIA -Continue preadmission aspirin and Plavix -Not on statin      DVT prophylaxis: Lovenox Code Status: Full  Family Communication: Daughter Disposition Plan: Home Consults called: Infectious disease/[place]    ELLIS,ALLISON L. ANP-BC [facility] Pager [phone]   If 7PM-7AM, please contact night-coverage [email or web address] Password [email or web address]  10/17/2016, 1:40 PM

## 2016-10-17 NOTE — ED Notes (Signed)
Admit MD at bedside

## 2016-10-18 DIAGNOSIS — E46 Unspecified protein-calorie malnutrition: Secondary | ICD-10-CM

## 2016-10-18 DIAGNOSIS — E871 Hypo-osmolality and hyponatremia: Secondary | ICD-10-CM

## 2016-10-18 DIAGNOSIS — E860 Dehydration: Secondary | ICD-10-CM

## 2016-10-18 LAB — BASIC METABOLIC PANEL
ANION GAP: 4 — AB (ref 5–15)
BUN: 5 mg/dL — AB (ref 6–20)
CALCIUM: 8.4 mg/dL — AB (ref 8.9–10.3)
CO2: 18 mmol/L — AB (ref 22–32)
Chloride: 112 mmol/L — ABNORMAL HIGH (ref 101–111)
Creatinine, Ser: 0.59 mg/dL (ref 0.44–1.00)
GFR calc Af Amer: >60 mL/min (ref >60)
GFR calc non Af Amer: >60 mL/min (ref >60)
GLUCOSE: 79 mg/dL (ref 65–99)
Potassium: 3.9 mmol/L (ref 3.5–5.1)
Sodium: 134 mmol/L — ABNORMAL LOW (ref 135–145)

## 2016-10-18 LAB — CBC
HEMATOCRIT: 31.5 % — AB (ref 36.0–46.0)
Hemoglobin: 10.9 g/dL — ABNORMAL LOW (ref 12.0–15.0)
MCH: 29.5 pg (ref 26.0–34.0)
MCHC: 34.6 g/dL (ref 30.0–36.0)
MCV: 85.4 fL (ref 78.0–100.0)
Platelets: 261 10*3/uL (ref 150–400)
RBC: 3.69 MIL/uL — ABNORMAL LOW (ref 3.87–5.11)
RDW: 13.3 % (ref 11.5–15.5)
WBC: 3.6 10*3/uL — ABNORMAL LOW (ref 4.0–10.5)

## 2016-10-18 MED ORDER — HEPARIN SODIUM (PORCINE) 5000 UNIT/ML IJ SOLN
5000.0000 [IU] | Freq: Two times a day (BID) | INTRAMUSCULAR | Status: DC
  Administered 2016-10-19 – 2016-10-21 (×5): 5000 [IU] via SUBCUTANEOUS
  Filled 2016-10-18 (×5): qty 1

## 2016-10-18 NOTE — Progress Notes (Signed)
PROGRESS NOTE                                                                                                                                                                                                             Patient Demographics:    Sydney [name], is a 83 y.o. female, DOB - [birth year], TIR:[id number]  Admit date - 10/17/2016   Admitting Physician Elwin Mocha, MD  Outpatient Primary MD for the patient is [name], MD  LOS - 1  Outpatient Specialists: ID Dr [name]  Chief Complaint  Patient presents with  . Weakness       Brief Narrative   Sydney [name] is a 83 y.o. female with a Past Medical History significant for C. difficile colitis, hypertension, malnutrition who presents with diarrhea and weakness. Dx C diff   Subjective:    Sydney [name] today has, No headache, No chest pain, No abdominal pain , no nausea or vomiting, but she does report generalized weakness, and watery diarrhea.   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 difficile antigen and toxin - This seems to be recurrent problem followed by infectious disease as an outpatient, I do recommend to continue treatment for C. difficile colitis with Fidaxomycine, continue with appropriate hydration, continue with probiotics, per ID fecal microbial transplant may be ultimate solution. - Still reports watery diarrhea   Dehydration with hyponatremia/ Acute hypokalemia/non AG acidosis - Improving with replacement, monitor closely. - Advanced to soft diet   Hypertension - Orthostatic secondary to dehydration - Was not on  antihypertensive medications prior to admission  Protein calorie malnutrition  - Nutrition consultation - Continue with supplements  High cholesterol/his of TIA - Continue preadmission aspirin and Plavix - Not on statin      Code Status : Full  Family Communication  : daughter at bedside  Disposition Plan  : Home when stable  Consults  :  ID  Procedures  : None  DVT Prophylaxis  :   Heparin   Lab  Results  Component Value Date   PLT 261 10/18/2016    Antibiotics  :    Anti-infectives    Start     Dose/Rate Route Frequency Ordered Stop   11/23/16 1000  vancomycin (VANCOCIN) 50 mg/mL oral solution 125 mg  Status:  Discontinued     125 mg Oral Every 3 DAYS 10/17/16 1355 10/17/16 1548   11/15/16 1000  vancomycin (VANCOCIN) 50 mg/mL oral solution 125 mg  Status:  Discontinued     125 mg Oral Every other day 10/17/16 1355 10/17/16 1548   11/08/16 1000  vancomycin (VANCOCIN) 50 mg/mL oral solution 125 mg  Status:  Discontinued     125 mg Oral Daily 10/17/16 1355 10/17/16 1548   10/31/16 2200  vancomycin (VANCOCIN) 50 mg/mL oral solution 125 mg  Status:  Discontinued     125 mg Oral 2 times daily 10/17/16 1355 10/17/16 1548   10/17/16 1700  fidaxomicin (DIFICID) tablet 200 mg     200 mg Oral 2 times daily 10/17/16 1503 10/27/16 2159   10/17/16 1400  vancomycin (VANCOCIN) 50 mg/mL oral solution 125 mg  Status:  Discontinued     125 mg Oral 4 times daily 10/17/16 1355 10/17/16 1548        Objective:   Vitals:   10/17/16 1730 10/17/16 2207 10/18/16 0500 10/18/16 0549  BP: (!) 141/66 127/66  110/62  Pulse: (!) 58 71  66  Resp: 16 18  18   Temp: 97.7 F (36.5 C) 98 F (36.7 C)  98.2 F (36.8 C)  TempSrc: Oral     SpO2: 100% 100%  100%  Weight: 41.3 kg (91 lb)  42.2 kg (93 lb)   Height: 5\' 3"  (1.6 m)       Wt Readings from Last 3 Encounters:  10/18/16 42.2 kg (93 lb)  10/15/16 39.9 kg (88 lb)  10/05/16 43.7 kg (96 lb 6.4 oz)     Intake/Output Summary (Last 24  hours) at 10/18/16 1315 Last data filed at 10/18/16 1056  Gross per 24 hour  Intake          1744.17 ml  Output                0 ml  Net          1744.17 ml     Physical Exam  Elderly cachectic female laying in bed in no apparent distress . Awake Alert, Oriented X 3, No new F.N deficits, Normal affect Symmetrical Chest wall movement, Good air movement bilaterally, CTAB RRR,No Gallops,Rubs or new Murmurs, No Parasternal Heave +ve B.Sounds, Abd Soft, No tenderness, No rebound - guarding or rigidity. No Cyanosis, Clubbing or edema, No new Rash or bruise      Data Review:    CBC  Recent Labs Lab 10/13/16 0039 10/15/16 0827 10/17/16 1015 10/18/16 0606  WBC 8.5 5.0 5.6 3.6*  HGB 12.7 12.5 12.0 10.9*  HCT 37.2 36.7 34.3* 31.5*  PLT 273 288 247 261  MCV 87.7 86.4 85.5 85.4  MCH 30.0 29.4 29.9 29.5  MCHC 34.1 34.1 35.0 34.6  RDW 13.2 13.1 13.0 13.3  LYMPHSABS 0.8  --   --   --   MONOABS 0.7  --   --   --   EOSABS 0.0  --   --   --   BASOSABS 0.0  --   --   --     Chemistries   Recent Labs Lab 10/13/16 0039 10/15/16 0827 10/17/16 1015 10/17/16  1820 10/18/16 0606  NA 130* 133* 133*  --  134*  K 3.9 3.0* 3.1*  --  3.9  CL 108 106 107  --  112*  CO2 14* 20* 17*  --  18*  GLUCOSE 95 100* 61*  --  79  BUN 12 8 6   --  5*  CREATININE 0.74 0.65 0.67  --  0.59  CALCIUM 9.3 9.3 8.9  --  8.4*  MG 1.9  --   --  1.6*  --   AST 35  --   --   --   --   ALT 19  --   --   --   --   ALKPHOS 76  --   --   --   --   BILITOT 1.1  --   --   --   --    ------------------------------------------------------------------------------------------------------------------ No results for input(s): CHOL, HDL, LDLCALC, TRIG, CHOLHDL, LDLDIRECT in the last 72 hours.  Lab Results  Component Value Date   HGBA1C 5.8 (H) 04/09/2015   ------------------------------------------------------------------------------------------------------------------ No results for input(s): TSH, T4TOTAL,  T3FREE, THYROIDAB in the last 72 hours.  Invalid input(s): FREET3 ------------------------------------------------------------------------------------------------------------------ No results for input(s): VITAMINB12, FOLATE, FERRITIN, TIBC, IRON, RETICCTPCT in the last 72 hours.  Coagulation profile No results for input(s): INR, PROTIME in the last 168 hours.  No results for input(s): DDIMER in the last 72 hours.  Cardiac Enzymes No results for input(s): CKMB, TROPONINI, MYOGLOBIN in the last 168 hours.  Invalid input(s): CK ------------------------------------------------------------------------------------------------------------------ No results found for: BNP  Inpatient Medications  Scheduled Meds: . aspirin EC  81 mg Oral Daily  . clopidogrel  75 mg Oral Daily  . enoxaparin (LOVENOX) injection  40 mg Subcutaneous Q24H  . feeding supplement  1 Container Oral TID BM   Or  . feeding supplement (ENSURE ENLIVE)  237 mL Oral TID BM  . fidaxomicin  200 mg Oral BID  . folic acid  1 mg Oral Daily  . latanoprost  1 drop Both Eyes QHS  . saccharomyces boulardii  250 mg Oral BID   Continuous Infusions: . 0.9 % NaCl with KCl 20 mEq / L 125 mL/hr at 10/18/16 1054   PRN Meds:.acetaminophen **OR** acetaminophen, fluticasone  Micro Results Recent Results (from the past 240 hour(s))  C difficile quick scan w PCR reflex     Status: Abnormal   Collection Time: 10/17/16  2:15 PM  Result Value Ref Range Status   C Diff antigen POSITIVE (A) NEGATIVE Final   C Diff toxin NEGATIVE NEGATIVE Final   C Diff interpretation Results are indeterminate. See PCR results.  Final  Gastrointestinal Panel by PCR , Stool     Status: None   Collection Time: 10/17/16  2:15 PM  Result Value Ref Range Status   Campylobacter species NOT DETECTED NOT DETECTED Final   Plesimonas shigelloides NOT DETECTED NOT DETECTED Final   Salmonella species NOT DETECTED NOT DETECTED Final   Yersinia enterocolitica NOT  DETECTED NOT DETECTED Final   Vibrio species NOT DETECTED NOT DETECTED Final   Vibrio cholerae NOT DETECTED NOT DETECTED Final   Enteroaggregative E coli (EAEC) NOT DETECTED NOT DETECTED Final   Enteropathogenic E coli (EPEC) NOT DETECTED NOT DETECTED Final   Enterotoxigenic E coli (ETEC) NOT DETECTED NOT DETECTED Final   Shiga like toxin producing E coli (STEC) NOT DETECTED NOT DETECTED Final   Shigella/Enteroinvasive E coli (EIEC) NOT DETECTED NOT DETECTED Final   Cryptosporidium NOT DETECTED NOT DETECTED Final  Cyclospora cayetanensis NOT DETECTED NOT DETECTED Final   Entamoeba histolytica NOT DETECTED NOT DETECTED Final   Giardia lamblia NOT DETECTED NOT DETECTED Final   Adenovirus F40/41 NOT DETECTED NOT DETECTED Final   Astrovirus NOT DETECTED NOT DETECTED Final   Norovirus GI/GII NOT DETECTED NOT DETECTED Final   Rotavirus A NOT DETECTED NOT DETECTED Final   Sapovirus (I, II, IV, and V) NOT DETECTED NOT DETECTED Final  Clostridium Difficile by PCR     Status: Abnormal   Collection Time: 10/17/16  2:15 PM  Result Value Ref Range Status   Toxigenic C Difficile by pcr POSITIVE (A) NEGATIVE Final    Comment: Positive for toxigenic C. difficile with little to no toxin production. Only treat if clinical presentation suggests symptomatic illness.    Radiology Reports Dg Chest 2 View  Result Date: 10/15/2016 CLINICAL DATA:  Weakness for 1 week EXAM: CHEST  2 VIEW COMPARISON:  04/08/2015 FINDINGS: Cardiac shadow is within normal limits. Lungs are hyperinflated. No focal infiltrate or sizable effusion is seen. No acute bony abnormality is noted. IMPRESSION: COPD without acute abnormality. Electronically Signed   By: Inez Catalina M.D.   On: 10/15/2016 10:33   Mr Brain Wo Contrast  Result Date: 10/02/2016 CLINICAL DATA:  83 year old female with dizziness, difficulty balancing, falling to the left. Undergoing treatment for Clostridium difficile colitis. EXAM: MRI HEAD WITHOUT CONTRAST  TECHNIQUE: Multiplanar, multiecho pulse sequences of the brain and surrounding structures were obtained without intravenous contrast. COMPARISON:  Brain MRI and intracranial MRA 04/08/2015. FINDINGS: Brain: Stable cerebral volume. No restricted diffusion to suggest acute infarction. No midline shift, ventriculomegaly, or acute intracranial hemorrhage. Cervicomedullary junction and pituitary are within normal limits. Chronic 16-17 mm superior left parafalcine meningioma is stable since 2016. No significant mass effect on the left superior frontal gyrus (series 2, image 13 and series 10, image 12). No associated cerebral edema. Stable gray-white matter differentiation throughout the brain. Patchy periatrial and other scattered subcortical white matter T2 and FLAIR hyperintensity is nonspecific but stable since 2016 no cortical encephalomalacia. There is a small chronic subcortical white matter microhemorrhage in the right parietal lobe which may be new since 2016 (series 8, image 65). No other chronic cerebral blood products. Vascular: Major intracranial vascular flow voids are stable. Skull and upper cervical spine: Negative. Normal bone marrow signal. Sinuses/Orbits: Stable an negative. Other: Mild right mastoid effusion has nearly resolved since 2016. Visible internal auditory structures appear normal. Negative scalp soft tissues. IMPRESSION: 1.  No acute intracranial abnormality. 2. Essentially stable MRI appearance of the brain since 2016, including a small 17 mm left parafalcine meningioma which appears inconsequential. 3. Largely resolved right mastoid effusion since 2016 which was likely postinflammatory. Electronically Signed   By: Genevie Ann M.D.   On: 10/02/2016 16:06   Ct Abdomen Pelvis W Contrast  Result Date: 10/17/2016 CLINICAL DATA:  Diarrhea for 1 week. EXAM: CT ABDOMEN AND PELVIS WITH CONTRAST TECHNIQUE: Multidetector CT imaging of the abdomen and pelvis was performed using the standard protocol  following bolus administration of intravenous contrast. CONTRAST:  127mL ISOVUE-300 IOPAMIDOL (ISOVUE-300) INJECTION 61% COMPARISON:  CT, 05/14/2016 FINDINGS: Lower chest: No acute findings. Stable mild atelectasis or scarring at the lung bases. Heart is normal in size. Hepatobiliary: 11 mm low-density lesion in the central liver, stable consistent with a cyst. No other liver abnormalities. Gallbladder is unremarkable. No bile duct dilation. Pancreas: Unremarkable. No pancreatic ductal dilatation or surrounding inflammatory changes. Spleen: Normal in size without focal abnormality. Adrenals/Urinary  Tract: Adrenal glands are unremarkable. Kidneys are normal, without renal calculi, focal lesion, or hydronephrosis. Bladder is unremarkable. Stomach/Bowel: Stomach is unremarkable. There is no bowel wall thickening or adjacent inflammation. Air-fluid levels are noted in the small bowel and colon. No appendix is visualized. Vascular/Lymphatic: No discrete enlarged lymph nodes. Aortic atherosclerosis. Reproductive: The uterus, which contains calcifications consistent with calcified fibroids, lies posteriorly with the rectosigmoid displaced to the right. This is stable. No adnexal masses. Other: No hernia or ascites. Musculoskeletal: No fracture or acute finding. No osteoblastic or osteolytic lesions. IMPRESSION: 1. Air-fluid levels are noted in the small bowel colon. There is no bowel wall thickening or adjacent inflammation. This finding may be due to gastroenteritis. 2. No other evidence of an acute abnormality. 3. Stable central liver cysts. 4. Small calcified uterine fibroids. 5. Aortic atherosclerosis. Electronically Signed   By: Lajean Manes M.D.   On: 10/17/2016 21:42     Naren Benally M.D on 10/18/2016 at 1:15 PM  Between 7am to 7pm - Pager - [phone]  After 7pm go to [email or web address] - password [facility]  [facility] -  Office  [phone]

## 2016-10-19 ENCOUNTER — Ambulatory Visit: Payer: Medicare Other | Admitting: Internal Medicine

## 2016-10-19 DIAGNOSIS — I10 Essential (primary) hypertension: Secondary | ICD-10-CM

## 2016-10-19 DIAGNOSIS — A0472 Enterocolitis due to Clostridium difficile, not specified as recurrent: Secondary | ICD-10-CM

## 2016-10-19 LAB — CBC
HEMATOCRIT: 31.8 % — AB (ref 36.0–46.0)
Hemoglobin: 11 g/dL — ABNORMAL LOW (ref 12.0–15.0)
MCH: 30.2 pg (ref 26.0–34.0)
MCHC: 34.6 g/dL (ref 30.0–36.0)
MCV: 87.4 fL (ref 78.0–100.0)
Platelets: 254 10*3/uL (ref 150–400)
RBC: 3.64 MIL/uL — AB (ref 3.87–5.11)
RDW: 13.8 % (ref 11.5–15.5)
WBC: 3.8 10*3/uL — AB (ref 4.0–10.5)

## 2016-10-19 LAB — BASIC METABOLIC PANEL
ANION GAP: 5 (ref 5–15)
BUN: 8 mg/dL (ref 6–20)
CO2: 15 mmol/L — AB (ref 22–32)
Calcium: 8.2 mg/dL — ABNORMAL LOW (ref 8.9–10.3)
Chloride: 113 mmol/L — ABNORMAL HIGH (ref 101–111)
Creatinine, Ser: 0.49 mg/dL (ref 0.44–1.00)
GFR calc Af Amer: >60 mL/min (ref >60)
GFR calc non Af Amer: >60 mL/min (ref >60)
GLUCOSE: 75 mg/dL (ref 65–99)
POTASSIUM: 4.5 mmol/L (ref 3.5–5.1)
Sodium: 133 mmol/L — ABNORMAL LOW (ref 135–145)

## 2016-10-19 LAB — PHOSPHORUS: Phosphorus: 1.7 mg/dL — ABNORMAL LOW (ref 2.5–4.6)

## 2016-10-19 LAB — MAGNESIUM: Magnesium: 1.5 mg/dL — ABNORMAL LOW (ref 1.7–2.4)

## 2016-10-19 MED ORDER — SODIUM PHOSPHATES 45 MMOLE/15ML IV SOLN
30.0000 mmol | Freq: Once | INTRAVENOUS | Status: AC
  Administered 2016-10-19: 30 mmol via INTRAVENOUS
  Filled 2016-10-19: qty 10

## 2016-10-19 MED ORDER — SODIUM CHLORIDE 0.9 % IV SOLN
INTRAVENOUS | Status: DC
  Administered 2016-10-19 – 2016-10-21 (×3): via INTRAVENOUS

## 2016-10-19 MED ORDER — BOOST / RESOURCE BREEZE PO LIQD
1.0000 | Freq: Two times a day (BID) | ORAL | Status: DC
  Administered 2016-10-19: 1 via ORAL

## 2016-10-19 MED ORDER — MAGNESIUM SULFATE 2 GM/50ML IV SOLN
2.0000 g | Freq: Once | INTRAVENOUS | Status: AC
  Administered 2016-10-19: 2 g via INTRAVENOUS
  Filled 2016-10-19: qty 50

## 2016-10-19 NOTE — Progress Notes (Addendum)
[facility] for Infectious Disease    Date of Admission:  10/17/2016   Total days of antibiotics 3        Day 2 fidaxomicin           ID: Sydney [name] is a 83 y.o. 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 difficile diarrhea    Subjective: Only 1 bm today, still watery  Medications:  . aspirin EC  81 mg Oral Daily  . clopidogrel  75 mg Oral Daily  . fidaxomicin  200 mg Oral BID  . folic acid  1 mg Oral Daily  . heparin subcutaneous  5,000 Units Subcutaneous Q12H  . latanoprost  1 drop Both Eyes QHS  . saccharomyces boulardii  250 mg Oral BID    Objective: Vital signs in last 24 hours: Temp:  [97.6 F (36.4 C)-98.1 F (36.7 C)] 98 F (36.7 C) (06/25 1500) Pulse Rate:  [66-76] 76 (06/25 1500) Resp:  [18-20] 18 (06/25 1500) BP: (102-112)/(46-62) 102/46 (06/25 1500) SpO2:  [100 %] 100 % (06/25 1500) Weight:  [96 lb (43.5 kg)] 96 lb (43.5 kg) (06/25 0024)  Physical Exam  Constitutional:  oriented to person, place, and time. appears well-developed and well-nourished. No distress.  HENT: [place]/AT, PERRLA, no scleral icterus Mouth/Throat: Oropharynx is clear and moist. No oropharyngeal exudate.  Cardiovascular: Normal rate, regular rhythm and normal heart sounds. Exam reveals no gallop and no friction rub.  No murmur heard.  Pulmonary/Chest: Effort normal and breath sounds normal. No respiratory distress.  has no wheezes.  Neck = supple, no nuchal rigidity Abdominal: Soft. Bowel sounds are normal.  exhibits no distension. There is no tenderness.  Lymphadenopathy: no cervical adenopathy. No axillary adenopathy Neurological: alert and oriented to person, place, and time.  Skin: Skin is warm and dry. No rash noted. No erythema.    Psychiatric: a normal mood and affect.  behavior is normal.    Lab Results  Recent Labs  10/18/16 0606 10/19/16 0626  WBC 3.6* 3.8*  HGB 10.9* 11.0*  HCT 31.5* 31.8*  NA 134* 133*  K 3.9 4.5  CL 112* 113*  CO2 18* 15*  BUN 5* 8  CREATININE 0.59 0.49    Microbiology: cdiff pcr+ Studies/Results: Ct Abdomen Pelvis W Contrast  Result Date: 10/17/2016 CLINICAL DATA:  Diarrhea for 1 week. EXAM: CT ABDOMEN AND PELVIS WITH CONTRAST TECHNIQUE: Multidetector CT imaging of the abdomen and pelvis was performed using the standard protocol following bolus administration of intravenous contrast. CONTRAST:  154mL ISOVUE-300 IOPAMIDOL (ISOVUE-300) INJECTION 61% COMPARISON:  CT, 05/14/2016 FINDINGS: Lower chest: No acute findings. Stable mild atelectasis or scarring at the lung bases. Heart is normal in size. Hepatobiliary: 11 mm low-density lesion in the central liver, stable consistent with a cyst. No other liver abnormalities. Gallbladder is unremarkable. No bile duct dilation. Pancreas: Unremarkable. No pancreatic ductal dilatation or surrounding inflammatory changes. Spleen: Normal in size without focal abnormality. Adrenals/Urinary Tract: Adrenal glands are unremarkable. Kidneys are normal, without renal calculi, focal lesion, or hydronephrosis. Bladder is unremarkable. Stomach/Bowel: Stomach is unremarkable. There is no bowel wall thickening or adjacent inflammation. Air-fluid levels are noted in the small bowel and colon. No appendix is visualized. Vascular/Lymphatic: No discrete enlarged lymph nodes. Aortic atherosclerosis. Reproductive: The uterus,  which contains calcifications consistent with calcified fibroids, lies posteriorly with the rectosigmoid displaced to the right. This is stable. No adnexal masses. Other: No hernia or ascites. Musculoskeletal: No fracture or acute finding. No osteoblastic or osteolytic lesions. IMPRESSION: 1. Air-fluid levels are noted in the small bowel colon. There is  no bowel wall thickening or adjacent inflammation. This finding may be due to gastroenteritis. 2. No other evidence of an acute abnormality. 3. Stable central liver cysts. 4. Small calcified uterine fibroids. 5. Aortic atherosclerosis. Electronically Signed   By: Lajean Manes M.D.   On: 10/17/2016 21:42     Assessment/Plan: Recurrent cdiff = continue with fidoxamicin for now. With plan to d/c home with a 10 day course. I would start the process for pre authorization/approval through specialty pharmacy. Will  Arrange for  Fmt as an outpatient  Malnutrition = will try boost or consider solid protein supplementation  Diarrhea = less frequent today. But still watery  Spent 35 min with the patient and her daughter, Sydney [name], with all the time being spent on counseling and expectation for recurrent cdifficile and moving towards [place], [facility] Cell: [phone] Pager: [phone]  10/19/2016, 7:37 PM

## 2016-10-19 NOTE — Progress Notes (Addendum)
PROGRESS NOTE                                                                                                                                                                                                             Patient Demographics:    Sydney [name], is a 83 y.o. female, DOB - [birth year], OIZ:[id number]  Admit date - 10/17/2016   Admitting Physician Elwin Mocha, MD  Outpatient Primary MD for the patient is [name], MD  LOS - 2  Outpatient Specialists: ID Dr [name]  Chief Complaint  Patient presents with  . Weakness       Brief Narrative   Sydney [name] is a 83 y.o. female with a Past Medical History significant for C. difficile colitis, hypertension, malnutrition who presents with diarrhea and weakness. Dx C diff   Subjective:    [name] today She still reports significant diarrhea, total of 9 episodes yesterday, otherwise denies nausea, vomiting or abdominal pain .   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 difficile antigen and toxin - This seems to be recurrent problem followed by infectious disease as an outpatient,recommend to continue treatment for C. difficile colitis with Fidaxomycine, Continue with IV fluids, continue with probiotics, continue with probiotics, per ID fecal microbial transplant may be ultimate solution. - Still reports watery diarrhea. - I will stop her insurer and all supplements today to see if it has been contributing to her diarrhea or not, discussed this plan with the daughter, will hold these for 1-2 days to see if her diarrhea improves withholding  these, as well I change her to lactose free diet.(She can be resumed in her supplement and 1 day there is no improvement with diarrhea after holding supplements)   Dehydration with hyponatremia/ Acute hypokalemia/non AG acidosis - Improving with replacement, monitor closely. -Tolerating soft diet   Hypertension - Orthostatic secondary to dehydration - Was not on antihypertensive medications prior to admission  Protein calorie malnutrition  - Nutrition consultation - Continue with supplements  High cholesterol/his of TIA - Continue preadmission aspirin and Plavix - Not on statin   Hypomagnesemia/hypophosphatemia - Repleted   Code Status : Full  Family Communication  : daughter at bedside  Disposition Plan  : Home when stable  Consults  :  ID  Procedures  : None  DVT Prophylaxis  :   Heparin   Lab Results  Component Value Date   PLT 254 10/19/2016    Antibiotics  :    Anti-infectives    Start     Dose/Rate Route Frequency Ordered Stop   11/23/16 1000  vancomycin (VANCOCIN) 50 mg/mL oral solution 125 mg  Status:  Discontinued     125 mg Oral Every 3 DAYS 10/17/16 1355 10/17/16 1548   11/15/16 1000  vancomycin (VANCOCIN) 50 mg/mL oral solution 125 mg  Status:  Discontinued     125 mg Oral Every other day 10/17/16 1355 10/17/16 1548   11/08/16 1000  vancomycin (VANCOCIN) 50 mg/mL oral solution 125 mg  Status:  Discontinued     125 mg Oral Daily 10/17/16 1355 10/17/16 1548   10/31/16 2200  vancomycin (VANCOCIN) 50 mg/mL oral solution 125 mg  Status:  Discontinued     125 mg Oral 2 times daily 10/17/16 1355 10/17/16 1548   10/17/16 1700  fidaxomicin (DIFICID) tablet 200 mg     200 mg Oral 2 times daily 10/17/16 1503 10/27/16 2159   10/17/16 1400  vancomycin (VANCOCIN) 50 mg/mL oral solution 125 mg  Status:  Discontinued     125 mg Oral 4 times daily 10/17/16 1355 10/17/16 1548        Objective:   Vitals:   10/18/16 2252 10/19/16 0024 10/19/16 0620 10/19/16  1500  BP: (!) 109/54  112/62 (!) 102/46  Pulse: 73  66 76  Resp: 20  20 18   Temp: 97.6 F (36.4 C)  98.1 F (36.7 C) 98 F (36.7 C)  TempSrc:   Oral Oral  SpO2: 100%  100% 100%  Weight:  43.5 kg (96 lb)    Height:        Wt Readings from Last 3 Encounters:  10/19/16 43.5 kg (96 lb)  10/15/16 39.9 kg (88 lb)  10/05/16 43.7 kg (96 lb 6.4 oz)     Intake/Output Summary (Last 24 hours) at 10/19/16 1608 Last data filed at 10/19/16 1500  Gross per 24 hour  Intake          3282.08 ml  Output                0 ml  Net          3282.08 ml     Physical Exam  Frail elderly cachectic female laying in bed in no apparent distress  Awake alert 3  Good air entry bilaterally, clear to auscultation  RRR,No Gallops,Rubs or new Murmurs, No Parasternal Heave Abdomen soft, nontender, nondistended, bowel sounds present No Cyanosis, Clubbing or edema, No new Rash or bruise      Data Review:    CBC  Recent Labs Lab 10/13/16 0039 10/15/16 0827 10/17/16 1015 10/18/16 0606 10/19/16 0626  WBC 8.5 5.0 5.6 3.6* 3.8*  HGB 12.7 12.5 12.0 10.9* 11.0*  HCT 37.2 36.7 34.3* 31.5* 31.8*  PLT 273 288 247 261 254  MCV 87.7 86.4 85.5 85.4 87.4  MCH 30.0 29.4 29.9 29.5 30.2  MCHC 34.1 34.1 35.0 34.6 34.6  RDW 13.2 13.1 13.0 13.3 13.8  LYMPHSABS 0.8  --   --   --   --  MONOABS 0.7  --   --   --   --   EOSABS 0.0  --   --   --   --   BASOSABS 0.0  --   --   --   --     Chemistries   Recent Labs Lab 10/13/16 0039 10/15/16 0827 10/17/16 1015 10/17/16 1820 10/18/16 0606 10/19/16 0626  NA 130* 133* 133*  --  134* 133*  K 3.9 3.0* 3.1*  --  3.9 4.5  CL 108 106 107  --  112* 113*  CO2 14* 20* 17*  --  18* 15*  GLUCOSE 95 100* 61*  --  79 75  BUN 12 8 6   --  5* 8  CREATININE 0.74 0.65 0.67  --  0.59 0.49  CALCIUM 9.3 9.3 8.9  --  8.4* 8.2*  MG 1.9  --   --  1.6*  --  1.5*  AST 35  --   --   --   --   --   ALT 19  --   --   --   --   --   ALKPHOS 76  --   --   --   --   --   BILITOT  1.1  --   --   --   --   --    ------------------------------------------------------------------------------------------------------------------ No results for input(s): CHOL, HDL, LDLCALC, TRIG, CHOLHDL, LDLDIRECT in the last 72 hours.  Lab Results  Component Value Date   HGBA1C 5.8 (H) 04/09/2015   ------------------------------------------------------------------------------------------------------------------ No results for input(s): TSH, T4TOTAL, T3FREE, THYROIDAB in the last 72 hours.  Invalid input(s): FREET3 ------------------------------------------------------------------------------------------------------------------ No results for input(s): VITAMINB12, FOLATE, FERRITIN, TIBC, IRON, RETICCTPCT in the last 72 hours.  Coagulation profile No results for input(s): INR, PROTIME in the last 168 hours.  No results for input(s): DDIMER in the last 72 hours.  Cardiac Enzymes No results for input(s): CKMB, TROPONINI, MYOGLOBIN in the last 168 hours.  Invalid input(s): CK ------------------------------------------------------------------------------------------------------------------ No results found for: BNP  Inpatient Medications  Scheduled Meds: . aspirin EC  81 mg Oral Daily  . clopidogrel  75 mg Oral Daily  . feeding supplement  1 Container Oral BID BM  . fidaxomicin  200 mg Oral BID  . folic acid  1 mg Oral Daily  . heparin subcutaneous  5,000 Units Subcutaneous Q12H  . latanoprost  1 drop Both Eyes QHS  . saccharomyces boulardii  250 mg Oral BID   Continuous Infusions: . sodium chloride 75 mL/hr at 10/19/16 0937   PRN Meds:.acetaminophen **OR** acetaminophen, fluticasone  Micro Results Recent Results (from the past 240 hour(s))  C difficile quick scan w PCR reflex     Status: Abnormal   Collection Time: 10/17/16  2:15 PM  Result Value Ref Range Status   C Diff antigen POSITIVE (A) NEGATIVE Final   C Diff toxin NEGATIVE NEGATIVE Final   C Diff  interpretation Results are indeterminate. See PCR results.  Final  Gastrointestinal Panel by PCR , Stool     Status: None   Collection Time: 10/17/16  2:15 PM  Result Value Ref Range Status   Campylobacter species NOT DETECTED NOT DETECTED Final   Plesimonas shigelloides NOT DETECTED NOT DETECTED Final   Salmonella species NOT DETECTED NOT DETECTED Final   Yersinia enterocolitica NOT DETECTED NOT DETECTED Final   Vibrio species NOT DETECTED NOT DETECTED Final   Vibrio cholerae NOT DETECTED NOT DETECTED Final   Enteroaggregative E coli (EAEC)  NOT DETECTED NOT DETECTED Final   Enteropathogenic E coli (EPEC) NOT DETECTED NOT DETECTED Final   Enterotoxigenic E coli (ETEC) NOT DETECTED NOT DETECTED Final   Shiga like toxin producing E coli (STEC) NOT DETECTED NOT DETECTED Final   Shigella/Enteroinvasive E coli (EIEC) NOT DETECTED NOT DETECTED Final   Cryptosporidium NOT DETECTED NOT DETECTED Final   Cyclospora cayetanensis NOT DETECTED NOT DETECTED Final   Entamoeba histolytica NOT DETECTED NOT DETECTED Final   Giardia lamblia NOT DETECTED NOT DETECTED Final   Adenovirus F40/41 NOT DETECTED NOT DETECTED Final   Astrovirus NOT DETECTED NOT DETECTED Final   Norovirus GI/GII NOT DETECTED NOT DETECTED Final   Rotavirus A NOT DETECTED NOT DETECTED Final   Sapovirus (I, II, IV, and V) NOT DETECTED NOT DETECTED Final  Clostridium Difficile by PCR     Status: Abnormal   Collection Time: 10/17/16  2:15 PM  Result Value Ref Range Status   Toxigenic C Difficile by pcr POSITIVE (A) NEGATIVE Final    Comment: Positive for toxigenic C. difficile with little to no toxin production. Only treat if clinical presentation suggests symptomatic illness.    Radiology Reports Dg Chest 2 View  Result Date: 10/15/2016 CLINICAL DATA:  Weakness for 1 week EXAM: CHEST  2 VIEW COMPARISON:  04/08/2015 FINDINGS: Cardiac shadow is within normal limits. Lungs are hyperinflated. No focal infiltrate or sizable effusion is  seen. No acute bony abnormality is noted. IMPRESSION: COPD without acute abnormality. Electronically Signed   By: Inez Catalina M.D.   On: 10/15/2016 10:33   Mr Brain Wo Contrast  Result Date: 10/02/2016 CLINICAL DATA:  83 year old female with dizziness, difficulty balancing, falling to the left. Undergoing treatment for Clostridium difficile colitis. EXAM: MRI HEAD WITHOUT CONTRAST TECHNIQUE: Multiplanar, multiecho pulse sequences of the brain and surrounding structures were obtained without intravenous contrast. COMPARISON:  Brain MRI and intracranial MRA 04/08/2015. FINDINGS: Brain: Stable cerebral volume. No restricted diffusion to suggest acute infarction. No midline shift, ventriculomegaly, or acute intracranial hemorrhage. Cervicomedullary junction and pituitary are within normal limits. Chronic 16-17 mm superior left parafalcine meningioma is stable since 2016. No significant mass effect on the left superior frontal gyrus (series 2, image 13 and series 10, image 12). No associated cerebral edema. Stable gray-white matter differentiation throughout the brain. Patchy periatrial and other scattered subcortical white matter T2 and FLAIR hyperintensity is nonspecific but stable since 2016 no cortical encephalomalacia. There is a small chronic subcortical white matter microhemorrhage in the right parietal lobe which may be new since 2016 (series 8, image 65). No other chronic cerebral blood products. Vascular: Major intracranial vascular flow voids are stable. Skull and upper cervical spine: Negative. Normal bone marrow signal. Sinuses/Orbits: Stable an negative. Other: Mild right mastoid effusion has nearly resolved since 2016. Visible internal auditory structures appear normal. Negative scalp soft tissues. IMPRESSION: 1.  No acute intracranial abnormality. 2. Essentially stable MRI appearance of the brain since 2016, including a small 17 mm left parafalcine meningioma which appears inconsequential. 3. Largely  resolved right mastoid effusion since 2016 which was likely postinflammatory. Electronically Signed   By: Genevie Ann M.D.   On: 10/02/2016 16:06   Ct Abdomen Pelvis W Contrast  Result Date: 10/17/2016 CLINICAL DATA:  Diarrhea for 1 week. EXAM: CT ABDOMEN AND PELVIS WITH CONTRAST TECHNIQUE: Multidetector CT imaging of the abdomen and pelvis was performed using the standard protocol following bolus administration of intravenous contrast. CONTRAST:  116mL ISOVUE-300 IOPAMIDOL (ISOVUE-300) INJECTION 61% COMPARISON:  CT, 05/14/2016 FINDINGS:  Lower chest: No acute findings. Stable mild atelectasis or scarring at the lung bases. Heart is normal in size. Hepatobiliary: 11 mm low-density lesion in the central liver, stable consistent with a cyst. No other liver abnormalities. Gallbladder is unremarkable. No bile duct dilation. Pancreas: Unremarkable. No pancreatic ductal dilatation or surrounding inflammatory changes. Spleen: Normal in size without focal abnormality. Adrenals/Urinary Tract: Adrenal glands are unremarkable. Kidneys are normal, without renal calculi, focal lesion, or hydronephrosis. Bladder is unremarkable. Stomach/Bowel: Stomach is unremarkable. There is no bowel wall thickening or adjacent inflammation. Air-fluid levels are noted in the small bowel and colon. No appendix is visualized. Vascular/Lymphatic: No discrete enlarged lymph nodes. Aortic atherosclerosis. Reproductive: The uterus, which contains calcifications consistent with calcified fibroids, lies posteriorly with the rectosigmoid displaced to the right. This is stable. No adnexal masses. Other: No hernia or ascites. Musculoskeletal: No fracture or acute finding. No osteoblastic or osteolytic lesions. IMPRESSION: 1. Air-fluid levels are noted in the small bowel colon. There is no bowel wall thickening or adjacent inflammation. This finding may be due to gastroenteritis. 2. No other evidence of an acute abnormality. 3. Stable central liver cysts.  4. Small calcified uterine fibroids. 5. Aortic atherosclerosis. Electronically Signed   By: Lajean Manes M.D.   On: 10/17/2016 21:42     Paisely Brick M.D on 10/19/2016 at 4:08 PM  Between 7am to 7pm - Pager - [phone]  After 7pm go to [email or web address] - password [facility]  [facility] -  Office  [phone]

## 2016-10-19 NOTE — Progress Notes (Signed)
Initial Nutrition Assessment  DOCUMENTATION CODES:   Underweight, Severe malnutrition in context of chronic illness  INTERVENTION:   -Boost Breeze po BID, each supplement provides 250 kcal and 9 grams of protein  NUTRITION DIAGNOSIS:   Malnutrition (Severe) related to chronic illness (colitis) as evidenced by percent weight loss, moderate depletion of body fat, severe depletion of body fat, moderate depletions of muscle mass, severe depletion of muscle mass.  GOAL:   Patient will meet greater than or equal to 90% of their needs  MONITOR:   PO intake, Supplement acceptance, Labs, Weight trends, TF tolerance, Skin, I & O's  REASON FOR ASSESSMENT:   Consult, Malnutrition Screening Tool Assessment of nutrition requirement/status  ASSESSMENT:   Sydney [name] is a 83 y.o. female with a Past Medical History significant for C. difficile colitis, hypertension, malnutrition who presents with diarrhea and weakness.  Pt admitted with c-diff.   Case discussed with RN, who reports pt has had ongoing diarrhea. Pt typically consumes Ensure supplements at home, however, experienced diarrhea soon after drinking them yesterday. RN shares antibiotic was recently changes, which may be the cause of diarrhea. Plan per MD is to hold off on Ensure supplements today and re-assess in the morning. Pt has been transitioned to a lactose-free diet in attempt to help control diarrhea.   Spoke with pt and daughter, who report pt has been undergoing wt loss over at least the past 6 months, but noticed severe wt loss (7-8#) over the past week. Pt reports that she has not been able to each a lot ("my stomach shrunk") over quite some time and had been consuming Ensure supplements once daily as recommended by her doctor. Pt is also very active, walking regularly and being involved in her church and community. Pt is currently eating well (PO: 50-75%). Pt daughter reiterated concern over nutritional adequacy and was  curious about lactose-free supplement options.   Pt reports UBW is around 107#. Pt has experienced a 10.3% wt loss over the past 6 months, which is significant for time frame. Both pt and daughter are petite and small-framed, however, pt and daughter both verbalize that pt has lost fat and muscle as well as looser fitting clothes.   Nutrition-Focused physical exam completed. Findings are moderate to severe fat depletion, moderate to severe muscle depletion, and no edema.   Discussed importance of good meal and supplement intake to promote healing. Reviewed ways to increase calorie and protein intake, including small, frequent meals and increasing frequency of supplements.  Labs reviewed: Na: 133 (on IV supplementation), Phos: 1.7, Mg: 1.5.   Diet Order:  DIET SOFT Room service appropriate? Yes; Fluid consistency: Thin  Skin:  Reviewed, no issues  Last BM:  10/19/16  Height:   Ht Readings from Last 1 Encounters:  10/17/16 5\' 3"  (1.6 m)    Weight:   Wt Readings from Last 1 Encounters:  10/19/16 96 lb (43.5 kg)    Ideal Body Weight:  52.3 kg  BMI:  Body mass index is 17.01 kg/m.  Estimated Nutritional Needs:   Kcal:  1100-1300  Protein:  45-60 grams  Fluid:  1.1-1.3 L  EDUCATION NEEDS:   Education needs addressed  Rilee Knoll A. Jimmye Norman, RD, LDN, CDE Pager: [phone] After hours Pager: [phone]

## 2016-10-20 DIAGNOSIS — E7800 Pure hypercholesterolemia, unspecified: Secondary | ICD-10-CM

## 2016-10-20 DIAGNOSIS — E43 Unspecified severe protein-calorie malnutrition: Secondary | ICD-10-CM

## 2016-10-20 DIAGNOSIS — A09 Infectious gastroenteritis and colitis, unspecified: Secondary | ICD-10-CM

## 2016-10-20 DIAGNOSIS — I951 Orthostatic hypotension: Secondary | ICD-10-CM

## 2016-10-20 DIAGNOSIS — E876 Hypokalemia: Secondary | ICD-10-CM

## 2016-10-20 LAB — CBC
HCT: 30.5 % — ABNORMAL LOW (ref 36.0–46.0)
Hemoglobin: 10.4 g/dL — ABNORMAL LOW (ref 12.0–15.0)
MCH: 29.1 pg (ref 26.0–34.0)
MCHC: 34.1 g/dL (ref 30.0–36.0)
MCV: 85.2 fL (ref 78.0–100.0)
PLATELETS: 243 10*3/uL (ref 150–400)
RBC: 3.58 MIL/uL — AB (ref 3.87–5.11)
RDW: 13.5 % (ref 11.5–15.5)
WBC: 3.3 10*3/uL — ABNORMAL LOW (ref 4.0–10.5)

## 2016-10-20 LAB — BASIC METABOLIC PANEL
Anion gap: 4 — ABNORMAL LOW (ref 5–15)
BUN: 8 mg/dL (ref 6–20)
CALCIUM: 7.8 mg/dL — AB (ref 8.9–10.3)
CO2: 19 mmol/L — AB (ref 22–32)
CREATININE: 0.49 mg/dL (ref 0.44–1.00)
Chloride: 113 mmol/L — ABNORMAL HIGH (ref 101–111)
GFR calc non Af Amer: >60 mL/min (ref >60)
Glucose, Bld: 81 mg/dL (ref 65–99)
Potassium: 3.6 mmol/L (ref 3.5–5.1)
SODIUM: 136 mmol/L (ref 135–145)

## 2016-10-20 MED ORDER — BOOST / RESOURCE BREEZE PO LIQD
1.0000 | Freq: Three times a day (TID) | ORAL | Status: DC
  Administered 2016-10-20 – 2016-10-21 (×3): 1 via ORAL

## 2016-10-20 NOTE — Progress Notes (Signed)
Benefits check in process for Dificid 200 mg twice a day CM to f/u with results. Whitman Hero RN,BSN,CM

## 2016-10-20 NOTE — Care Management Note (Addendum)
Case Management Note  Patient Details  Name: RAVAN SCHLEMMER MRN: [id number] Date of Birth: [birth year]  Subjective/Objective:             Admitted with recurrent colitis.     Resides with [name]. PTA received home health services( PT,OT) provided by [facility].  [name] (Daughter) [name] Mention (Sister)    [phone]     PCP: [name]  Action/Plan:  Return to home when medically stable. CM to f/u with disposition needs.  Expected Discharge Date:                  Expected Discharge Plan:  [place]  In-House Referral:     Discharge planning Services  CM Consult  Post Acute Care Choice:  Resumption of Svcs/PTA Provider Choice offered to:  Patient  DME Arranged:    DME Agency:     HH Arranged:  PT, OT [place] Agency:  [place], resumption of home health services referral made with Butch Penny @ [phone]  Status of Service:  In process, will continue to follow  If discussed at Long Length of Stay Meetings, dates discussed:    Additional Comments:  Sharin Mons, RN 10/20/2016, 12:16 PM

## 2016-10-20 NOTE — Progress Notes (Signed)
    [place] for Infectious Disease    Date of Admission:  10/17/2016   Total days of antibiotics 4        Day 3 fidaxomicin           ID: Sydney [name] is a 83 y.o. 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 difficile diarrhea   Protein-calorie malnutrition, severe    Subjective: Only 1 bm today but now getting formed. Tolerated boost supplement drink. No blood  In stool  Medications:  . aspirin EC  81 mg Oral Daily  . clopidogrel  75 mg Oral Daily  . fidaxomicin  200 mg Oral BID  . folic acid  1 mg Oral Daily  . heparin subcutaneous  5,000 Units Subcutaneous Q12H  . latanoprost  1 drop Both Eyes QHS  . saccharomyces boulardii  250 mg Oral BID    Objective: Vital signs in last 24 hours: Temp:  [98 F (36.7 C)-98.4 F (36.9 C)] 98.4 F (36.9 C) (06/26 1436) Pulse Rate:  [64-76] 65 (06/26 1436) Resp:  [18] 18 (06/26 1436) BP: (102-108)/(46-63) 105/51 (06/26 1436) SpO2:  [100 %] 100 % (06/26 1436) Weight:  [102 lb 1.6 oz (46.3 kg)] 102 lb 1.6 oz (46.3 kg) (06/26 0419)  Physical Exam  Constitutional:  oriented to person, place, and time. appears well-developed and well-nourished. No distress.  HENT: [place]/AT, PERRLA, no scleral icterus HEENT= mmm, op clear Abd= soft, ntnd, slow BS   Lab Results  Recent Labs  10/19/16 0626 10/20/16 0549  WBC 3.8* 3.3*  HGB 11.0* 10.4*  HCT 31.8* 30.5*  NA 133* 136  K 4.5 3.6  CL 113* 113*  CO2 15* 19*  BUN 8 8  CREATININE 0.49 0.49    Microbiology: cdiff pcr+ Studies/Results: No results found.   Assessment/Plan: Recurrent cdiff = continue with fidoxamicin for now. With plan to d/c home with a 10 day course.[facility] only $50 co pay. Will   Arrange for  Fmt as an outpatient  Malnutrition = continue with boost 2-3 cans per day. consider solid protein supplementation  Diarrhea = less frequent today. Getting formed   [name] [facility] Cell: [phone] Pager: [phone]  10/20/2016, 2:52 PM

## 2016-10-20 NOTE — Progress Notes (Signed)
PROGRESS NOTE    Sydney [name]  ZOX:[id number] DOB: 11-Jun-1930 DOA: 10/17/2016 PCP: [name], MD   Chief Complaint  Patient presents with  . Weakness    Brief Narrative:  HPI on 10/17/2016 by Ms. [name], NP [name] is a 83 y.o. female with medical history significant for hypertension, dyslipidemia, TIAs and recent issues with recurrent colitis and diarrhea secondary to C. difficile infection. Patient was occasionally diagnosed with C. difficile colitis in January 2018 and had been started on Flagyl by her primary care physician but did not tolerate this and was eventually started on oral vancomycin. She was hospitalized briefly in January due to the symptoms. She has had recurrent diarrhea and ER visits/hospitalizations for recurrent C. difficile colitis despite completing medications as prescribed. She is also being followed by [name] with ID in the outpatient setting. She was most recently hospitalized for generalized weakness, near syncopal episode and dehydration in the setting of continued diarrhea and subsequently discharged on 6/12. She was on a slow oral vancomycin taper during that hospitalization at the recommendation of ID. Both the C. difficile panel and GI pathogen panel PCR were negative for any infectious causes to her diarrhea and on date of discharge (24 hours later) she was reporting solid stools. Since that time she has returned to the ER on 6/17, 6/19 and 6/21 due to reported issues with diarrhea. She was seen by her PCP earlier last week and a stool sample was obtained and apparently was positive for both C. difficile antigen and toxin. She was resumed on vancomycin. Prior to obtaining a positive PCR result, the patient had been referred to Dr. Amedeo Plenty with gastroenterology who had planned on doing endoscopy but with a positive C. difficile test additional gastroenterology evaluation was deferred. ID has also discussed the possibility for fecal transplant with  the patient and her family. She returns to the ER today because of generalized weakness, abdominal cramping and watery stools with solid oral intake.  Assessment & Plan   Recurrent colitis with Clostridium difficile -Readmitted with recurrent symptomatic watery diarrhea -Outpatient testing positive for C. difficile antigen and toxin -Patient has completed several courses of oral vancomycin -Infectious disease consulted and appreciated -Continue Dificid, IV fluids, probiotics -Discussed with [name], patient may need physical microbial transplant, this would be arranged as an outpatient -Patient feels diarrhea has improved today -Feeding supplementation such as ensure discontinued as this could be contributing to her diarrhea -Continue to monitor  Dehydration with hyponatremia/acute hypokalemia/non-anion gap acidosis -Improved with IV fluids, and diet  Severe protein calorie malnutrition -Nutrition consultation appreciated, continue feeding supplementation  Essential hypertension -Patient was orthostatic secondary to dehydration -Was not on antihypertensive medications prior to admission  Hyperlipidemia/history of TIA -Continue aspirin and Plavix -Currently not on statin  Hypomagnesemia/hypophosphatemia  -Was replaced -Likely secondary to GI losses -Continue to monitor  DVT Prophylaxis  heparin  Code Status: Full  Family Communication: Daughter at bedside  Disposition Plan: Admitted. Possibly discharge to home with home health on 10/21/16 pending improvement of diarrhea  Consultants Infectious disease  Procedures  None  Antibiotics   Anti-infectives    Start     Dose/Rate Route Frequency Ordered Stop   11/23/16 1000  vancomycin (VANCOCIN) 50 mg/mL oral solution 125 mg  Status:  Discontinued     125 mg Oral Every 3 DAYS 10/17/16 1355 10/17/16 1548   11/15/16 1000  vancomycin (VANCOCIN) 50 mg/mL oral solution 125 mg  Status:  Discontinued  125 mg Oral Every  other day 10/17/16 1355 10/17/16 1548   11/08/16 1000  vancomycin (VANCOCIN) 50 mg/mL oral solution 125 mg  Status:  Discontinued     125 mg Oral Daily 10/17/16 1355 10/17/16 1548   10/31/16 2200  vancomycin (VANCOCIN) 50 mg/mL oral solution 125 mg  Status:  Discontinued     125 mg Oral 2 times daily 10/17/16 1355 10/17/16 1548   10/17/16 1700  fidaxomicin (DIFICID) tablet 200 mg     200 mg Oral 2 times daily 10/17/16 1503 10/27/16 2159   10/17/16 1400  vancomycin (VANCOCIN) 50 mg/mL oral solution 125 mg  Status:  Discontinued     125 mg Oral 4 times daily 10/17/16 1355 10/17/16 1548      Subjective:   [name] seen and examined today.  Patient reports she is still having diarrhea however feels she is having fewer episodes today. Currently denies abdominal pain, nausea or vomiting, chest pain or shortness of breath.  Objective:   Vitals:   10/19/16 2132 10/20/16 0419 10/20/16 0655 10/20/16 1436  BP: 108/63  (!) 107/56 (!) 105/51  Pulse: 68  64 65  Resp: 18  18 18   Temp: 98.3 F (36.8 C)  98.4 F (36.9 C) 98.4 F (36.9 C)  TempSrc:      SpO2: 100%  100% 100%  Weight:  46.3 kg (102 lb 1.6 oz)    Height:        Intake/Output Summary (Last 24 hours) at 10/20/16 1450 Last data filed at 10/20/16 0600  Gross per 24 hour  Intake           1547.5 ml  Output                0 ml  Net           1547.5 ml   Filed Weights   10/18/16 0500 10/19/16 0024 10/20/16 0419  Weight: 42.2 kg (93 lb) 43.5 kg (96 lb) 46.3 kg (102 lb 1.6 oz)    Exam  General: Well developed, thin, no apparent distress  HEENT: NCAT, mucous membranes moist.   Cardiovascular: S1 S2 auscultated, no rubs, murmurs or gallops. Regular rate and rhythm.  Respiratory: Clear to auscultation bilaterally with equal chest rise  Abdomen: Soft, nontender, nondistended, + bowel sounds  Extremities: warm dry without cyanosis clubbing or edema  Neuro: AAOx3, nonfocal  Psych: Normal affect and demeanor with intact  judgement and insight   Data Reviewed: I have personally reviewed following labs and imaging studies  CBC:  Recent Labs Lab 10/15/16 0827 10/17/16 1015 10/18/16 0606 10/19/16 0626 10/20/16 0549  WBC 5.0 5.6 3.6* 3.8* 3.3*  HGB 12.5 12.0 10.9* 11.0* 10.4*  HCT 36.7 34.3* 31.5* 31.8* 30.5*  MCV 86.4 85.5 85.4 87.4 85.2  PLT 288 247 261 254 536   Basic Metabolic Panel:  Recent Labs Lab 10/15/16 0827 10/17/16 1015 10/17/16 1820 10/18/16 0606 10/19/16 0626 10/20/16 0549  NA 133* 133*  --  134* 133* 136  K 3.0* 3.1*  --  3.9 4.5 3.6  CL 106 107  --  112* 113* 113*  CO2 20* 17*  --  18* 15* 19*  GLUCOSE 100* 61*  --  79 75 81  BUN 8 6  --  5* 8 8  CREATININE 0.65 0.67  --  0.59 0.49 0.49  CALCIUM 9.3 8.9  --  8.4* 8.2* 7.8*  MG  --   --  1.6*  --  1.5*  --  PHOS  --   --  2.5  --  1.7*  --    GFR: Estimated Creatinine Clearance: 38.9 mL/min (by C-G formula based on SCr of 0.49 mg/dL). Liver Function Tests: No results for input(s): AST, ALT, ALKPHOS, BILITOT, PROT, ALBUMIN in the last 168 hours. No results for input(s): LIPASE, AMYLASE in the last 168 hours. No results for input(s): AMMONIA in the last 168 hours. Coagulation Profile: No results for input(s): INR, PROTIME in the last 168 hours. Cardiac Enzymes: No results for input(s): CKTOTAL, CKMB, CKMBINDEX, TROPONINI in the last 168 hours. BNP (last 3 results) No results for input(s): PROBNP in the last 8760 hours. HbA1C: No results for input(s): HGBA1C in the last 72 hours. CBG: No results for input(s): GLUCAP in the last 168 hours. Lipid Profile: No results for input(s): CHOL, HDL, LDLCALC, TRIG, CHOLHDL, LDLDIRECT in the last 72 hours. Thyroid Function Tests: No results for input(s): TSH, T4TOTAL, FREET4, T3FREE, THYROIDAB in the last 72 hours. Anemia Panel: No results for input(s): VITAMINB12, FOLATE, FERRITIN, TIBC, IRON, RETICCTPCT in the last 72 hours. Urine analysis:    Component Value Date/Time    COLORURINE STRAW (A) 10/15/2016 1151   APPEARANCEUR CLEAR 10/15/2016 1151   LABSPEC 1.003 (L) 10/15/2016 1151   PHURINE 6.0 10/15/2016 1151   GLUCOSEU NEGATIVE 10/15/2016 1151   HGBUR NEGATIVE 10/15/2016 1151   BILIRUBINUR NEGATIVE 10/15/2016 1151   KETONESUR NEGATIVE 10/15/2016 1151   PROTEINUR NEGATIVE 10/15/2016 1151   UROBILINOGEN 0.2 27-May-202014 1048   NITRITE NEGATIVE 10/15/2016 1151   LEUKOCYTESUR NEGATIVE 10/15/2016 1151   Sepsis Labs: @LABRCNTIP (procalcitonin:4,lacticidven:4)  ) Recent Results (from the past 240 hour(s))  C difficile quick scan w PCR reflex     Status: Abnormal   Collection Time: 10/17/16  2:15 PM  Result Value Ref Range Status   C Diff antigen POSITIVE (A) NEGATIVE Final   C Diff toxin NEGATIVE NEGATIVE Final   C Diff interpretation Results are indeterminate. See PCR results.  Final  Gastrointestinal Panel by PCR , Stool     Status: None   Collection Time: 10/17/16  2:15 PM  Result Value Ref Range Status   Campylobacter species NOT DETECTED NOT DETECTED Final   Plesimonas shigelloides NOT DETECTED NOT DETECTED Final   Salmonella species NOT DETECTED NOT DETECTED Final   Yersinia enterocolitica NOT DETECTED NOT DETECTED Final   Vibrio species NOT DETECTED NOT DETECTED Final   Vibrio cholerae NOT DETECTED NOT DETECTED Final   Enteroaggregative E coli (EAEC) NOT DETECTED NOT DETECTED Final   Enteropathogenic E coli (EPEC) NOT DETECTED NOT DETECTED Final   Enterotoxigenic E coli (ETEC) NOT DETECTED NOT DETECTED Final   Shiga like toxin producing E coli (STEC) NOT DETECTED NOT DETECTED Final   Shigella/Enteroinvasive E coli (EIEC) NOT DETECTED NOT DETECTED Final   Cryptosporidium NOT DETECTED NOT DETECTED Final   Cyclospora cayetanensis NOT DETECTED NOT DETECTED Final   Entamoeba histolytica NOT DETECTED NOT DETECTED Final   Giardia lamblia NOT DETECTED NOT DETECTED Final   Adenovirus F40/41 NOT DETECTED NOT DETECTED Final   Astrovirus NOT DETECTED NOT  DETECTED Final   Norovirus GI/GII NOT DETECTED NOT DETECTED Final   Rotavirus A NOT DETECTED NOT DETECTED Final   Sapovirus (I, II, IV, and V) NOT DETECTED NOT DETECTED Final  Clostridium Difficile by PCR     Status: Abnormal   Collection Time: 10/17/16  2:15 PM  Result Value Ref Range Status   Toxigenic C Difficile by pcr POSITIVE (A) NEGATIVE Final  Comment: Positive for toxigenic C. difficile with little to no toxin production. Only treat if clinical presentation suggests symptomatic illness.      Radiology Studies: No results found.   Scheduled Meds: . aspirin EC  81 mg Oral Daily  . clopidogrel  75 mg Oral Daily  . fidaxomicin  200 mg Oral BID  . folic acid  1 mg Oral Daily  . heparin subcutaneous  5,000 Units Subcutaneous Q12H  . latanoprost  1 drop Both Eyes QHS  . saccharomyces boulardii  250 mg Oral BID   Continuous Infusions: . sodium chloride 75 mL/hr at 10/19/16 1838     LOS: 3 days   Time Spent in minutes   30 minutes  Phoebe Marter D.O. on 10/20/2016 at 2:50 PM  Between 7am to 7pm - Pager - [phone]  After 7pm go to [email or web address] - password [email or web address]  And look for the night coverage person covering for me after hours  [facility] Office  [phone]

## 2016-10-20 NOTE — Progress Notes (Signed)
[facility]  Patient Status: Active (receiving services up to time of hospitalization)  [facility] is providing the following services: PT and OT  If patient discharges after hours, please call [phone].   Sydney [name] 10/20/2016, 9:29 AM

## 2016-10-21 ENCOUNTER — Other Ambulatory Visit: Payer: Self-pay | Admitting: Internal Medicine

## 2016-10-21 DIAGNOSIS — R197 Diarrhea, unspecified: Secondary | ICD-10-CM

## 2016-10-21 LAB — BASIC METABOLIC PANEL
Anion gap: 4 — ABNORMAL LOW (ref 5–15)
BUN: 6 mg/dL (ref 6–20)
CHLORIDE: 112 mmol/L — AB (ref 101–111)
CO2: 20 mmol/L — ABNORMAL LOW (ref 22–32)
Calcium: 8.5 mg/dL — ABNORMAL LOW (ref 8.9–10.3)
Creatinine, Ser: 0.55 mg/dL (ref 0.44–1.00)
GFR calc Af Amer: >60 mL/min (ref >60)
GFR calc non Af Amer: >60 mL/min (ref >60)
GLUCOSE: 86 mg/dL (ref 65–99)
POTASSIUM: 3.7 mmol/L (ref 3.5–5.1)
SODIUM: 136 mmol/L (ref 135–145)

## 2016-10-21 LAB — CBC
HCT: 31.4 % — ABNORMAL LOW (ref 36.0–46.0)
HEMOGLOBIN: 10.8 g/dL — AB (ref 12.0–15.0)
MCH: 29.6 pg (ref 26.0–34.0)
MCHC: 34.4 g/dL (ref 30.0–36.0)
MCV: 86 fL (ref 78.0–100.0)
Platelets: 250 10*3/uL (ref 150–400)
RBC: 3.65 MIL/uL — AB (ref 3.87–5.11)
RDW: 13.7 % (ref 11.5–15.5)
WBC: 3.1 10*3/uL — ABNORMAL LOW (ref 4.0–10.5)

## 2016-10-21 LAB — PHOSPHORUS: PHOSPHORUS: 2.6 mg/dL (ref 2.5–4.6)

## 2016-10-21 LAB — MAGNESIUM: Magnesium: 1.7 mg/dL (ref 1.7–2.4)

## 2016-10-21 MED ORDER — FIDAXOMICIN 200 MG PO TABS: 200.0000 mg | ORAL_TABLET | Freq: Two times a day (BID) | ORAL | 1 refills | Status: DC

## 2016-10-21 MED FILL — DIFICID 200 MG TABLET: 200 | 10 days supply | Qty: 20 | Fill #0

## 2016-10-21 NOTE — Evaluation (Addendum)
Physical Therapy Evaluation Patient Details Name: Sydney [name] MRN: [id number] DOB: [birth year] Today's Date: 10/21/2016   History of Present Illness  83 y.o. female with medical history significant for hypertension, dyslipidemia, TIAs and recent issues with recurrent colitis and diarrhea secondary to C. difficile infection  Clinical Impression  Patient seen for mobility assessment. Patient tolerating activity well and mobilizing without significant deficits. At this time, anticipate patient will be safe for d/c home with resumption of HHPT services. Acute PT will sign off. Patient in agreement.    Follow Up Recommendations Home health PT;Supervision - Intermittent (resume HHPT services from prior to arrival)    Equipment Recommendations  Rolling walker with 5" wheels;3in1 (PT)    Recommendations for Other Services       Precautions / Restrictions Precautions Precautions: Fall Restrictions Weight Bearing Restrictions: No      Mobility  Bed Mobility Overal bed mobility: Modified Independent Bed Mobility: Supine to Sit;Sit to Supine              Transfers Overall transfer level: Modified independent Equipment used: None Transfers: Sit to/from Stand Sit to Stand: Modified independent (Device/Increase time)         General transfer comment: increased time to elevate, no physical assist required  Ambulation/Gait Ambulation/Gait assistance: Modified independent (Device/Increase time) Ambulation Distance (Feet): 160 Feet Assistive device: None Gait Pattern/deviations: Step-through pattern;Trunk flexed;Scissoring;Narrow base of support;Decreased step length - right Gait velocity: decreased   General Gait Details: modest instability, decreased gait speed, no physical assist required  Stairs            Wheelchair Mobility    Modified Rankin (Stroke Patients Only)       Balance Overall balance assessment: Needs assistance   Sitting balance-Leahy Scale:  Normal     Standing balance support: No upper extremity supported;Bilateral upper extremity supported Standing balance-Leahy Scale: Good                               Pertinent Vitals/Pain      Home Living Family/patient expects to be discharged to:: Private residence Living Arrangements: Children Available Help at Discharge: Family;Available PRN/intermittently Type of Home: House Home Access: Stairs to enter Entrance Stairs-Rails: Psychiatric nurse of Steps: 5 Home Layout: One level Home Equipment: None Additional Comments: Patient is alone during day when daughter is at work.    Prior Function Level of Independence: Independent         Comments: pt gets down in tub to bathe     Hand Dominance   Dominant Hand: Right    Extremity/Trunk Assessment   Upper Extremity Assessment Upper Extremity Assessment: Overall WFL for tasks assessed    Lower Extremity Assessment LLE Deficits / Details: Geneal LLE baseline deficits but [facility] for activity LLE Coordination: decreased gross motor       Communication   Communication: No difficulties  Cognition Arousal/Alertness: Awake/alert Behavior During Therapy: WFL for tasks assessed/performed Overall Cognitive Status: Within Functional Limits for tasks assessed                                        General Comments      Exercises     Assessment/Plan    PT Assessment All further PT needs can be met in the next venue of care (defer back to HHPT)  PT Problem List  PT Treatment Interventions      PT Goals (Current goals can be found in the Care Plan section)  Acute Rehab PT Goals Patient Stated Goal: to return to independence PT Goal Formulation: With patient/family Time For Goal Achievement: 10/12/16 Potential to Achieve Goals: Good    Frequency     Barriers to discharge        Co-evaluation               AM-PAC PT "6 Clicks" Daily Activity   Outcome Measure Difficulty turning over in bed (including adjusting bedclothes, sheets and blankets)?: None Difficulty moving from lying on back to sitting on the side of the bed? : None Difficulty sitting down on and standing up from a chair with arms (e.g., wheelchair, bedside commode, etc,.)?: A Little Help needed moving to and from a bed to chair (including a wheelchair)?: A Little Help needed walking in hospital room?: A Little Help needed climbing 3-5 steps with a railing? : A Little 6 Click Score: 20    End of Session Equipment Utilized During Treatment: Gait belt Activity Tolerance: Patient tolerated treatment well Patient left: in chair;with call bell/phone within reach Nurse Communication: Mobility status       Time: 4935-5217 PT Time Calculation (min) (ACUTE ONLY): 15 min   Charges:   PT Evaluation $PT Eval Low Complexity: 1 Procedure     PT G CodesAlben Deeds, PT DPT N[facility] [phone]   Duncan Dull 10/21/2016, 11:19 AM

## 2016-10-21 NOTE — Discharge Instructions (Signed)
Clostridium Difficile Infection  Clostridium difficile (C. difficile or C. diff) infection is a condition that causes inflammation of the large intestine (colon). This condition can result in damage to the lining of your colon and may lead to colitis. This infection can be passed from person to person (is contagious).  What are the causes?  C. diff is a bacterium that is normally found in the colon. This infection is caused when the balance of C. diff is changed and there is an overgrowth of C. diff. This is often caused by antibiotic use.  What increases the risk?  This condition is more likely to develop in people who:  · Take antibiotic medicines.  · Take a certain type of medicine called proton pump inhibitors over a long period of time (chronic use).  · Are older.  · Have had a C. diff infection before.  · Have serious underlying conditions, such as colon cancer.  · Are in the hospital.  · Have a weak defense (immune) system.  · Live in a place where there is a lot of contact with others, such as a nursing home.  · Have had gastrointestinal (GI) tract surgery.    What are the signs or symptoms?  Symptoms of this condition include:  · Diarrhea. This may be bloody, watery, or yellow or green in color.  · Fever.  · Fatigue.  · Loss of appetite.  · Nausea.  · Swelling, pain, or tenderness in the abdomen.  · Dehydration. Dehydration can cause you to be tired and thirsty, have a dry mouth, and urinate less frequently.    How is this diagnosed?  This condition is diagnosed with a medical history and physical exam. You may also have tests, including:  · A test that checks for C. diff in your stool.  · Blood tests.  · A sigmoidoscopy or colonoscopy to look at your colon. These procedures involve passing an instrument through your rectum to look at the inside of your colon.    How is this treated?  Treatment for this condition includes:  · Antibiotics that keep C. diff from growing.  · Stopping the antibiotics you were  on before the C. diff infection began. Only do this as told by your health care provider.  · Fluids through an IV tube, if you are dehydrated.  · Surgery to remove the infected part of the colon. This is rare.    Follow these instructions at home:  Eating and drinking  · Drink enough fluid to keep your urine clear or pale yellow. Avoid milk, caffeine, and alcohol.  · Follow specific rehydration instructions as told by your health care provider.  · Eat small, frequent meals instead of large meals.  Medicines  · Take your antibiotic medicine as told by your health care provider. Do not stop taking the antibiotic even if you start to feel better unless your health care provider told you to do that.  · Take over-the-counter and prescription medicines only as told by your health care provider.  · Do not use medicines to help with diarrhea.  General instructions  · Wash your hands thoroughly before you prepare food and after you use the bathroom. Make sure people who live with you also wash their hands often.  · Clean surfaces that you touch with a product that contains chlorine bleach.  · Keep all follow-up visits as told by your health care provider. This is important.  Contact a health care provider if:  ·   Your symptoms do not get better with treatment.  · Your symptoms get worse with treatment.  · Your symptoms go away and then return.  · You have a fever.  · You have new symptoms.  Get help right away if:  · You have increasing pain or tenderness in your abdomen.  · You have stool that is mostly bloody, or your stool looks dark black and tarry.  · You cannot eat or drink without vomiting.  · You have signs of dehydration, such as:  ? Dark urine, very little urine, or no urine.  ? Cracked lips.  ? Not making tears when you cry.  ? Dry mouth.  ? Sunken eyes.  ? Sleepiness.  ? Weakness.  ? Dizziness.  This information is not intended to replace advice given to you by your health care provider. Make sure you discuss any  questions you have with your health care provider.  Document Released: 01/21/2005 Document Revised: 09/19/2015 Document Reviewed: 10/15/2014  Elsevier Interactive Patient Education © 2017 [employer]

## 2016-10-21 NOTE — Progress Notes (Signed)
Gay Filler to be D/C'd to home with home health per MD order.  Discussed with the patient and all questions fully answered.  VSS, Skin clean, dry and intact without evidence of skin break down, no evidence of skin tears noted. IV catheter discontinued intact. Site without signs and symptoms of complications. Dressing and pressure applied.  An After Visit Summary was printed and given to the patient. Patient received prescription.  D/c education completed with patient/family including follow up instructions, medication list, d/c activities limitations if indicated, with other d/c instructions as indicated by MD - patient able to verbalize understanding, all questions fully answered.   Patient instructed to return to ED, call 911, or call MD for any changes in condition.   Patient escorted via [place], and D/C home via private auto.  Peder Allums L Price 10/21/2016 7:00 PM

## 2016-10-21 NOTE — Discharge Summary (Signed)
Physician Discharge Summary  PRICELLA GAUGH LOV:[id number] DOB: [birth year] DOA: 10/17/2016  PCP: [name], MD  Admit date: 10/17/2016 Discharge date: 10/21/2016  Time spent: 45 minutes  Recommendations for Outpatient Follow-up:  Patient will be discharged to home with home health services.  Patient will need to follow up with primary care provider within one week of discharge. Follow up with [name], infectious disease. Patient should continue medications as prescribed.  Patient should follow a soft diet, advance to heart healthy as tolerated.  Discharge Diagnoses:  Recurrent colitis with Clostridium difficile Dehydration with hyponatremia/acute hypokalemia/non-anion gap acidosis Severe protein calorie malnutrition Essential hypertension Hyperlipidemia/history of TIA Hypomagnesemia/hypophosphatemia   Discharge Condition: Stable  Diet recommendation: Soft  Filed Weights   10/19/16 0024 10/20/16 0419 10/21/16 0556  Weight: 43.5 kg (96 lb) 46.3 kg (102 lb 1.6 oz) 48.5 kg (106 lb 14.4 oz)    History of present illness:  on 10/17/2016 by Ms. [name], NP Tawny Asal Mahoneyis a 83 y.o.femalewith medical history significant for hypertension, dyslipidemia, TIAs and recent issues with recurrent colitis and diarrhea secondary to C. difficile infection. Patient was occasionally diagnosed with C. difficile colitis in January 2018 and had been started on Flagyl by her primary care physician but did not tolerate this and was eventually started on oral vancomycin. She was hospitalized briefly in January due to the symptoms. She has had recurrent diarrhea and ER visits/hospitalizations for recurrent C. difficile colitis despite completing medications as prescribed. She is also being followed by [name] with ID in the outpatient setting. She was most recently hospitalized for generalized weakness,near syncopal episode and dehydration in the setting of continued diarrhea and subsequently  discharged on 6/12. She was on a slow oral vancomycin taper during that hospitalization at the recommendation of ID. Both the C. difficile panel and GI pathogen panel PCR were negative for any infectious causes to her diarrhea and on date of discharge (24 hours later)she was reporting solid stools. Since that time she has returned to the ER on 6/17,6/19 and 6/21 due to reported issues with diarrhea. She was seen by her PCP earlier last week and a stool sample was obtained and apparently was positive for both C. difficile antigen and toxin. She was resumed on vancomycin. Prior to obtaining a positive PCR result, the patient had been referred to Dr. Amedeo Plenty with gastroenterology who had planned on doing endoscopy but with a positive C. difficile test additional gastroenterology evaluation was deferred. ID has also discussed the possibility for fecal transplant with the patient and her family. She returns to the ER today because of generalized weakness, abdominal cramping and watery stools with solid oral intake.  Hospital Course:  Recurrent colitis with Clostridium difficile -Readmitted with recurrent symptomatic watery diarrhea -Outpatient testing positive for C. difficile antigen and toxin -Patient has completed several courses of oral vancomycin -Infectious disease consulted and appreciated -Was placed on Dificid, IV fluids, probiotics -Discussed with [name], patient may need physical microbial transplant, this would be arranged as an outpatient -Patient feels diarrhea has improved today -Feeding supplementation such as ensure discontinued as this could be contributing to her diarrhea -Continue Dificid per ID recommendation. Continue probiotics  Dehydration with hyponatremia/acute hypokalemia/non-anion gap acidosis -Improved with IV fluids, and diet  Severe protein calorie malnutrition -Nutrition consultation appreciated, continue feeding supplementation, avoid lactose containing  supplements  Essential hypertension -Patient was orthostatic secondary to dehydration -Was not on antihypertensive medications prior to admission  Hyperlipidemia/history of TIA -Continue aspirin and Plavix -Currently  not on statin  Hypomagnesemia/hypophosphatemia  -Replaced, resolved -Likely secondary to GI losses -Continue to monitor  Physical deconditioning -PT consulted recommended HH, 3in1, rolling walker  Procedures: none  Consultations: Infectious disease  Discharge Exam: Vitals:   10/21/16 0556 10/21/16 1432  BP: (!) 121/53 103/64  Pulse: 67 (!) 58  Resp: 18 17  Temp: 98 F (36.7 C) 98 F (36.7 C)   Patient feels diarrhea has improved and more formed. Denies chest pain, shortness of breath, abdominal pain, nausea, vomiting, headache, dizziness.    General: Well developed, thin, NAD  HEENT: NCAT, mucous membranes moist.  Cardiovascular: S1 S2 auscultated, no rubs, murmurs or gallops. Regular rate and rhythm.  Respiratory: Clear to auscultation bilaterally with equal chest rise  Abdomen: Soft, nontender, nondistended, + bowel sounds  Extremities: warm dry without cyanosis clubbing or edema  Neuro: AAOx3, nonfocal  Psych: Normal affect and demeanor with intact judgement and insight, pleasant  Discharge Instructions Discharge Instructions    Discharge instructions    Complete by:  As directed    Patient will be discharged to home with home health services.  Patient will need to follow up with primary care provider within one week of discharge. Follow up with [name], infectious disease. Patient should continue medications as prescribed.  Patient should follow a soft diet, advance to heart healthy as tolerated.     Current Discharge Medication List    CONTINUE these medications which have NOT CHANGED   Details  aspirin (ECOTRIN LOW STRENGTH) 81 MG EC tablet Take 81 mg by mouth daily. Swallow whole.    clopidogrel (PLAVIX) 75 MG tablet Take 75  mg by mouth daily.    folic acid (FOLVITE) 1 MG tablet Take 1 mg by mouth daily.    latanoprost (XALATAN) 0.005 % ophthalmic solution Place 1 drop into both eyes at bedtime.     saccharomyces boulardii (FLORASTOR) 250 MG capsule Take 250 mg by mouth 2 (two) times daily.    fidaxomicin (DIFICID) 200 MG TABS tablet Take 1 tablet (200 mg total) by mouth 2 (two) times daily. Qty: 20 tablet, Refills: 1    fluticasone (FLONASE) 50 MCG/ACT nasal spray Place 2 sprays into both nostrils daily as needed for allergies or rhinitis.      STOP taking these medications     vancomycin (VANCOCIN) 125 MG capsule        Allergies  Allergen Reactions  . Demerol [Meperidine] Other (See Comments)    Excessive sweating, cramping  . Codeine Rash  . Sulfa Antibiotics Other (See Comments)    Reaction unknown  . Tomato Itching   Follow-up Information    [facility].   Why:  home health services arranged, office will call and set up home vistis Contact information: [address] [place] [address] [phone]        [name], MD. Schedule an appointment as soon as possible for a visit in 1 week(s).   Specialty:  Internal Medicine Why:  Hospital follow up Contact information: [address] [place] [address] [phone]        Carlyle Basques, MD. Schedule an appointment as soon as possible for a visit in 1 week(s).   Specialty:  Infectious Diseases Why:  Hospital follow up Contact information: [place] [address] [phone]            The results of significant diagnostics from this hospitalization (including imaging, microbiology,  ancillary and laboratory) are listed below for reference.    Significant Diagnostic Studies: Dg Chest 2 View  Result Date: 10/15/2016 CLINICAL DATA:  Weakness for 1 week EXAM: CHEST  2 VIEW COMPARISON:  04/08/2015 FINDINGS: Cardiac shadow is within  normal limits. Lungs are hyperinflated. No focal infiltrate or sizable effusion is seen. No acute bony abnormality is noted. IMPRESSION: COPD without acute abnormality. Electronically Signed   By: Inez Catalina M.D.   On: 10/15/2016 10:33   Mr Brain Wo Contrast  Result Date: 10/02/2016 CLINICAL DATA:  83 year old female with dizziness, difficulty balancing, falling to the left. Undergoing treatment for Clostridium difficile colitis. EXAM: MRI HEAD WITHOUT CONTRAST TECHNIQUE: Multiplanar, multiecho pulse sequences of the brain and surrounding structures were obtained without intravenous contrast. COMPARISON:  Brain MRI and intracranial MRA 04/08/2015. FINDINGS: Brain: Stable cerebral volume. No restricted diffusion to suggest acute infarction. No midline shift, ventriculomegaly, or acute intracranial hemorrhage. Cervicomedullary junction and pituitary are within normal limits. Chronic 16-17 mm superior left parafalcine meningioma is stable since 2016. No significant mass effect on the left superior frontal gyrus (series 2, image 13 and series 10, image 12). No associated cerebral edema. Stable gray-white matter differentiation throughout the brain. Patchy periatrial and other scattered subcortical white matter T2 and FLAIR hyperintensity is nonspecific but stable since 2016 no cortical encephalomalacia. There is a small chronic subcortical white matter microhemorrhage in the right parietal lobe which may be new since 2016 (series 8, image 65). No other chronic cerebral blood products. Vascular: Major intracranial vascular flow voids are stable. Skull and upper cervical spine: Negative. Normal bone marrow signal. Sinuses/Orbits: Stable an negative. Other: Mild right mastoid effusion has nearly resolved since 2016. Visible internal auditory structures appear normal. Negative scalp soft tissues. IMPRESSION: 1.  No acute intracranial abnormality. 2. Essentially stable MRI appearance of the brain since 2016, including a  small 17 mm left parafalcine meningioma which appears inconsequential. 3. Largely resolved right mastoid effusion since 2016 which was likely postinflammatory. Electronically Signed   By: Genevie Ann M.D.   On: 10/02/2016 16:06   Ct Abdomen Pelvis W Contrast  Result Date: 10/17/2016 CLINICAL DATA:  Diarrhea for 1 week. EXAM: CT ABDOMEN AND PELVIS WITH CONTRAST TECHNIQUE: Multidetector CT imaging of the abdomen and pelvis was performed using the standard protocol following bolus administration of intravenous contrast. CONTRAST:  151mL ISOVUE-300 IOPAMIDOL (ISOVUE-300) INJECTION 61% COMPARISON:  CT, 05/14/2016 FINDINGS: Lower chest: No acute findings. Stable mild atelectasis or scarring at the lung bases. Heart is normal in size. Hepatobiliary: 11 mm low-density lesion in the central liver, stable consistent with a cyst. No other liver abnormalities. Gallbladder is unremarkable. No bile duct dilation. Pancreas: Unremarkable. No pancreatic ductal dilatation or surrounding inflammatory changes. Spleen: Normal in size without focal abnormality. Adrenals/Urinary Tract: Adrenal glands are unremarkable. Kidneys are normal, without renal calculi, focal lesion, or hydronephrosis. Bladder is unremarkable. Stomach/Bowel: Stomach is unremarkable. There is no bowel wall thickening or adjacent inflammation. Air-fluid levels are noted in the small bowel and colon. No appendix is visualized. Vascular/Lymphatic: No discrete enlarged lymph nodes. Aortic atherosclerosis. Reproductive: The uterus, which contains calcifications consistent with calcified fibroids, lies posteriorly with the rectosigmoid displaced to the right. This is stable. No adnexal masses. Other: No hernia or ascites. Musculoskeletal: No fracture or acute finding. No osteoblastic or osteolytic lesions. IMPRESSION: 1. Air-fluid levels are noted in the small bowel colon. There is no bowel wall thickening or adjacent inflammation. This finding may be due to  gastroenteritis. 2.  No other evidence of an acute abnormality. 3. Stable central liver cysts. 4. Small calcified uterine fibroids. 5. Aortic atherosclerosis. Electronically Signed   By: Lajean Manes M.D.   On: 10/17/2016 21:42    Microbiology: Recent Results (from the past 240 hour(s))  C difficile quick scan w PCR reflex     Status: Abnormal   Collection Time: 10/17/16  2:15 PM  Result Value Ref Range Status   C Diff antigen POSITIVE (A) NEGATIVE Final   C Diff toxin NEGATIVE NEGATIVE Final   C Diff interpretation Results are indeterminate. See PCR results.  Final  Gastrointestinal Panel by PCR , Stool     Status: None   Collection Time: 10/17/16  2:15 PM  Result Value Ref Range Status   Campylobacter species NOT DETECTED NOT DETECTED Final   Plesimonas shigelloides NOT DETECTED NOT DETECTED Final   Salmonella species NOT DETECTED NOT DETECTED Final   Yersinia enterocolitica NOT DETECTED NOT DETECTED Final   Vibrio species NOT DETECTED NOT DETECTED Final   Vibrio cholerae NOT DETECTED NOT DETECTED Final   Enteroaggregative E coli (EAEC) NOT DETECTED NOT DETECTED Final   Enteropathogenic E coli (EPEC) NOT DETECTED NOT DETECTED Final   Enterotoxigenic E coli (ETEC) NOT DETECTED NOT DETECTED Final   Shiga like toxin producing E coli (STEC) NOT DETECTED NOT DETECTED Final   Shigella/Enteroinvasive E coli (EIEC) NOT DETECTED NOT DETECTED Final   Cryptosporidium NOT DETECTED NOT DETECTED Final   Cyclospora cayetanensis NOT DETECTED NOT DETECTED Final   Entamoeba histolytica NOT DETECTED NOT DETECTED Final   Giardia lamblia NOT DETECTED NOT DETECTED Final   Adenovirus F40/41 NOT DETECTED NOT DETECTED Final   Astrovirus NOT DETECTED NOT DETECTED Final   Norovirus GI/GII NOT DETECTED NOT DETECTED Final   Rotavirus A NOT DETECTED NOT DETECTED Final   Sapovirus (I, II, IV, and V) NOT DETECTED NOT DETECTED Final  Clostridium Difficile by PCR     Status: Abnormal   Collection Time: 10/17/16   2:15 PM  Result Value Ref Range Status   Toxigenic C Difficile by pcr POSITIVE (A) NEGATIVE Final    Comment: Positive for toxigenic C. difficile with little to no toxin production. Only treat if clinical presentation suggests symptomatic illness.     Labs: Basic Metabolic Panel:  Recent Labs Lab 10/17/16 1015 10/17/16 1820 10/18/16 0606 10/19/16 8850 10/20/16 0549 10/21/16 0713  NA 133*  --  134* 133* 136 136  K 3.1*  --  3.9 4.5 3.6 3.7  CL 107  --  112* 113* 113* 112*  CO2 17*  --  18* 15* 19* 20*  GLUCOSE 61*  --  79 75 81 86  BUN 6  --  5* 8 8 6   CREATININE 0.67  --  0.59 0.49 0.49 0.55  CALCIUM 8.9  --  8.4* 8.2* 7.8* 8.5*  MG  --  1.6*  --  1.5*  --  1.7  PHOS  --  2.5  --  1.7*  --  2.6   Liver Function Tests: No results for input(s): AST, ALT, ALKPHOS, BILITOT, PROT, ALBUMIN in the last 168 hours. No results for input(s): LIPASE, AMYLASE in the last 168 hours. No results for input(s): AMMONIA in the last 168 hours. CBC:  Recent Labs Lab 10/17/16 1015 10/18/16 0606 10/19/16 0626 10/20/16 0549 10/21/16 0713  WBC 5.6 3.6* 3.8* 3.3* 3.1*  HGB 12.0 10.9* 11.0* 10.4* 10.8*  HCT 34.3* 31.5* 31.8* 30.5* 31.4*  MCV 85.5 85.4 87.4 85.2  86.0  PLT 247 261 254 243 250   Cardiac Enzymes: No results for input(s): CKTOTAL, CKMB, CKMBINDEX, TROPONINI in the last 168 hours. BNP: BNP (last 3 results) No results for input(s): BNP in the last 8760 hours.  ProBNP (last 3 results) No results for input(s): PROBNP in the last 8760 hours.  CBG: No results for input(s): GLUCAP in the last 168 hours.     SignedCristal Ford  [facility] 10/21/2016, 2:58 PM

## 2016-10-21 NOTE — Progress Notes (Signed)
Benefits check : Dificid  Joni Reining, RN        Dificid at 200mg  doesn't require prior auth the copay is 200.00 20 for a 10day supply and 60 for a 30day supply   Previous Messages    ----- Message -----  From: Cecille Rubin, RN  Sent: 10/20/2016 12:07 PM  To: [facility] Case Mgr Assistant  Subject: benefits check                  Please check Dificid 200mg  twice a day, copay and prior authorization if needed. Thanks      Whitman Hero RN,BSN,CM

## 2016-10-29 MED FILL — DIFICID 200 MG TABLET: 200 | 10 days supply | Qty: 20 | Fill #1

## 2016-11-06 ENCOUNTER — Other Ambulatory Visit: Payer: Self-pay

## 2016-11-06 ENCOUNTER — Encounter: Payer: Self-pay | Admitting: Internal Medicine

## 2016-11-06 ENCOUNTER — Ambulatory Visit (INDEPENDENT_AMBULATORY_CARE_PROVIDER_SITE_OTHER): Payer: Medicare Other | Admitting: Internal Medicine

## 2016-11-06 VITALS — BP 93/60 | HR 59 | Temp 97.9°F | Ht 63.0 in | Wt 98.7 lb

## 2016-11-06 DIAGNOSIS — A0471 Enterocolitis due to Clostridium difficile, recurrent: Secondary | ICD-10-CM | POA: Diagnosis present

## 2016-11-06 DIAGNOSIS — E43 Unspecified severe protein-calorie malnutrition: Secondary | ICD-10-CM

## 2016-11-06 MED ORDER — FIDAXOMICIN 200 MG PO TABS: 200.0000 mg | ORAL_TABLET | Freq: Two times a day (BID) | ORAL | 1 refills | Status: DC

## 2016-11-06 MED FILL — DIFICID 200 MG TABLET: 200 | 10 days supply | Qty: 20 | Fill #0

## 2016-11-06 NOTE — Progress Notes (Signed)
RFV: recurrent c.difficile  Patient ID: Sydney [name], female   DOB: [birth year], 83 y.o.   MRN: [id number]  HPI 83yo F with recurrent cdifficile , recently discharge from hospital for cdifficile infection with dehydration and aki and progressive weight loss. She was initially started on oral vancomycin, but had slow response thus transitioned to fidaxomicin. She has gained 5 lb since dischage but still far from her bl weight of 110. She is roughly still 10% less than her baseline weight attributed to cdi.  She is on fidaxomicin for the past 16 d. She reports having 2-3 semi formed/loose stools. No blood in stool. She is having better appetite.she continues to take probiotics and boost protein drink. She had tried ensure drinks in the past but gave her upset stomach, possibly due to dairy component  No abdominal cramping with bowel movements  She is here with her daughter.  Outpatient Encounter Prescriptions as of 11/06/2016  Medication Sig  . aspirin (ECOTRIN LOW STRENGTH) 81 MG EC tablet Take 81 mg by mouth daily. Swallow whole.  . clopidogrel (PLAVIX) 75 MG tablet Take 75 mg by mouth daily.  . folic acid (FOLVITE) 1 MG tablet Take 1 mg by mouth daily.  Marland Kitchen latanoprost (XALATAN) 0.005 % ophthalmic solution Place 1 drop into both eyes at bedtime.   . saccharomyces boulardii (FLORASTOR) 250 MG capsule Take 250 mg by mouth 2 (two) times daily.  . [DISCONTINUED] fidaxomicin (DIFICID) 200 MG TABS tablet Take 1 tablet (200 mg total) by mouth 2 (two) times daily.  . fluticasone (FLONASE) 50 MCG/ACT nasal spray Place 2 sprays into both nostrils daily as needed for allergies or rhinitis. (Patient not taking: Reported on 11/06/2016)   No facility-administered encounter medications on file as of 11/06/2016.      Patient Active Problem List   Diagnosis Date Noted  . Protein-calorie malnutrition, severe 10/20/2016  . C. difficile diarrhea   . Hypertension 10/17/2016  . High cholesterol  10/17/2016  . Recurrent colitis due to Clostridium difficile 10/17/2016  . Dehydration with hyponatremia 10/17/2016  . Acute hypokalemia 10/17/2016  . Protein calorie malnutrition ([place]) 10/17/2016  . C. difficile colitis 10/05/2016  . Physical deconditioning 10/05/2016  . Recurrent Clostridium difficile diarrhea 10/05/2016  . Balance problem 10/05/2016  . History of transient ischemic attack (TIA) 10/05/2016  . Dehydration   . Hypokalemia 05/14/2016  . Anemia 05/14/2016  . Hyponatremia 05/14/2016  . Glaucoma 05/14/2016  . Enteritis 05/14/2016  . Meningioma ([place]) 05/01/2015  . 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 Diplopia 04/08/2015  . Leukopenia 04/08/2015  . Bradycardia 02/17/2013  . Occlusion and stenosis of carotid artery without mention of cerebral infarction 02/17/2013     Health Maintenance Due  Topic Date Due  . TETANUS/TDAP  10/23/1951  . PNA vac Low Risk Adult (1 of 2 - PCV13) 10/22/1997     Review of Systems +weight loss since January but improving appetite. Otherwise 12 point ros is negative Physical Exam   BP 93/60   Pulse (!) 59   Temp 97.9 F (36.6 C) (Oral)   Ht 5\' 3"  (1.6 m)   Wt 98 lb 11.2 oz (44.8 kg)   BMI 17.48 kg/m    gen =a xo by 3 in NAD HEENT= MMM, OP clear. No signs of thrush Skin= no signs of rash, tenting Psych = appropriate demeaner  Lab Results  Component Value Date   LABRPR Non Reactive 05/01/2015    CBC Lab Results  Component Value Date  WBC 3.1 (L) 10/21/2016   RBC 3.65 (L) 10/21/2016   HGB 10.8 (L) 10/21/2016   HCT 31.4 (L) 10/21/2016   PLT 250 10/21/2016   MCV 86.0 10/21/2016   MCH 29.6 10/21/2016   MCHC 34.4 10/21/2016   RDW 13.7 10/21/2016   LYMPHSABS 0.8 10/13/2016   MONOABS 0.7 10/13/2016   EOSABS 0.0 10/13/2016    BMET Lab Results  Component Value Date   NA 136 10/21/2016   K 3.7 10/21/2016   CL 112 (H) 10/21/2016   CO2 20 (L) 10/21/2016   GLUCOSE 86 10/21/2016   BUN 6 10/21/2016   CREATININE 0.55  10/21/2016   CALCIUM 8.5 (L) 10/21/2016   GFRNONAA >60 10/21/2016   GFRAA >60 10/21/2016      Assessment and Plan Recurrent clostridium difficile = she has had 4 recurrent episodes of clostridium difficile treated with oral vanco taper and most recently fidaxomicin. She would benefit from getting FMT. We will reach out to dr gessner to scheduling FMT  We will change to pulse dosing Q3 days then change over the treatment protocol for FMT. Can continue on probiotics.  Severe-protein malnutrition = continue with trying to take up to 3 cans of boost to supplement diet in order to gain weight.

## 2016-11-06 NOTE — Patient Instructions (Signed)
  Start taking your fidaximicin 1 tab every 3 days until further notice  If you start noticing having 4 water stools per day, change back to taking fidaximicin 1 tab twice a day  You will be hearing from dr gessner's office in regards to the fecal transplant scheduling

## 2016-11-10 ENCOUNTER — Telehealth: Payer: Self-pay

## 2016-11-10 ENCOUNTER — Ambulatory Visit (INDEPENDENT_AMBULATORY_CARE_PROVIDER_SITE_OTHER): Payer: Medicare Other | Admitting: Internal Medicine

## 2016-11-10 ENCOUNTER — Encounter: Payer: Self-pay | Admitting: Internal Medicine

## 2016-11-10 VITALS — BP 92/58 | HR 72 | Ht 63.0 in | Wt 97.4 lb

## 2016-11-10 DIAGNOSIS — Z7902 Long term (current) use of antithrombotics/antiplatelets: Secondary | ICD-10-CM

## 2016-11-10 DIAGNOSIS — A0471 Enterocolitis due to Clostridium difficile, recurrent: Secondary | ICD-10-CM

## 2016-11-10 DIAGNOSIS — A0472 Enterocolitis due to Clostridium difficile, not specified as recurrent: Secondary | ICD-10-CM

## 2016-11-10 NOTE — Telephone Encounter (Signed)
  11/10/2016   RE: Sydney [name] DOB: 11-16-1930 MRN: [id number]   Dear Dr [name] ,    We have scheduled the above patient for an endoscopic procedure. Our records show that she is on anticoagulation therapy.   Please advise as to how long the patient may come off her therapy of Plavix prior to the Colonoscopy/fecal transplant procedure, which is scheduled for 12/02/16.  Please fax back/ or route the completed form to Jonae Renshaw [place], [place] at [phone].   Sincerely,    Silvano Rusk, MD, [facility]

## 2016-11-10 NOTE — Progress Notes (Signed)
Sydney [name] 83 y.o. 12/19/1930 [id number]  Assessment & Plan:   Encounter Diagnoses  Name Primary?  . Recurrent colitis due to Clostridium difficile   . Encounter for long-term use of antiplatelets/antithrombotics Yes    Currently doing well on dificid every 3 days. She will continue that and we will plan a colonoscopy with fecal might robotic a transplant using the open biome specimen in early August. Coordinate with [name] of infectious disease.The risks and benefits as well as alternatives of endoscopic procedure(s) have been discussed and reviewed. All questions answered. The patient agrees to proceed.   I have instructed her to stop Dificid and Florastor 48 hours prior to the colonoscopy and transplant. She was also instructed to take 2 Imodium tablets 4 hours prior to her colonoscopy. Anticipate giving some afterwards as well.  Though the risk of bleeding with the colonoscopy as [name] I think it's reasonable to hold her clopidogrel because the indication is remote history of TIA. I did review the very rare but real risk of having another TIA or stroke off the clopidogrel but we will continue her aspirin. Will clarify with her primary care provider about this as well.  I appreciate the opportunity to care for this patient.  CC: Sydney Low, MD  Dr. Carlyle Basques   Subjective:   Chief Complaint: Recurrent C. difficile colitis  HPI Is a very nice lady here with her daughter, because of recurrent C. difficile colitis being followed and treated by Dr. Carlyle Basques. Multiple episodes since January of this year. The plan is for a fecal microbiologic a transplant. The patient is currently asymptomatic on her current medication regimen. She has lost weight and is been through a tough time with malnutrition etc. related to the illnesses. He has a remote history of a colonoscopy probably over 10 years ago. She is not sure who did it though it might have been Dr. Earle Gell. No history of significant gastrointestinal illnesses other than the C. difficile. Allergies  Allergen Reactions  . Demerol [Meperidine] Other (See Comments)    Excessive sweating, cramping  . Codeine Rash  . Sulfa Antibiotics Other (See Comments)    Reaction unknown  . Tomato Itching   Current Meds  Medication Sig  . aspirin (ECOTRIN [name] STRENGTH) 81 MG EC tablet Take 81 mg by mouth daily. Swallow whole.  . clopidogrel (PLAVIX) 75 MG tablet Take 75 mg by mouth daily.  . fidaxomicin (DIFICID) 200 MG TABS tablet Take 1 tablet (200 mg total) by mouth 2 (two) times daily.  . fluticasone (FLONASE) 50 MCG/ACT nasal spray Place 2 sprays into both nostrils daily as needed for allergies or rhinitis.  . folic acid (FOLVITE) 1 MG tablet Take 1 mg by mouth daily.  Marland Kitchen latanoprost (XALATAN) 0.005 % ophthalmic solution Place 1 drop into both eyes at bedtime.   . saccharomyces boulardii (FLORASTOR) 250 MG capsule Take 250 mg by mouth 2 (two) times daily.   Past Medical History:  Diagnosis Date  . Arthritis    "left hand" (02/17/2013)  . C. difficile diarrhea   . Glaucoma   . High cholesterol    "on RX years ago" (02/17/2013)  . Hypertension   . Meningioma ([place]) 05/01/2015  . Osteoporosis 02/2014   T score -2.5  followed by Dr. Lysle Rubens  . Palpitations    PACs, PVCs and short runs of atrial tachycardia on Holter monitoring  . TIA (transient ischemic attack) 1990's   Past Surgical History:  Procedure  Laterality Date  . ANAL FISTULECTOMY  1970  . TONSILLECTOMY     Social History   Social History  . Marital status: Widowed    Spouse name: N/A  . Number of children: 1  . Years of education: 50   Occupational History  . retired    Social History Main Topics  . Smoking status: Never Smoker  . Smokeless tobacco: Never Used  . Alcohol use No  . Drug use: No  . Sexual activity: No     Comment: 1st intercourse 83 yo-Fewer than 5 partners   Other Topics Concern  . Not on file    Social History Narrative   Patient does not drink caffeine.   Patient is right handed.    family history includes Heart attack in her father; Hypertension in her father; Stroke in her brother and maternal grandmother.   Review of Systems As per history of present illness. She's been somewhat weak.  Objective:   Physical Exam BP (!) 92/58   Pulse 72   Ht 5\' 3"  (1.6 m)   Wt 97 lb 6.4 oz (44.2 kg)   BMI 17.25 kg/m  Thin black woman no acute distress Eyes anicteric Lungs clear Heart S1-S2 no rubs murmurs or gallops are heard Abdomen is soft nontender no masses

## 2016-11-10 NOTE — Assessment & Plan Note (Signed)
FMT

## 2016-11-10 NOTE — Patient Instructions (Addendum)
  You have been scheduled for a fecal transplant. Please follow written instructions given to you at your visit today.  Please pick up your prep supplies at the pharmacy. If you use inhalers (even only as needed), please bring them with you on the day of your procedure.   Stop your dificid and probiotic on 11/30/16 (48 hours) prior to your procedure.    Take 2 Imodium tablets 4 hours prior to your procedure.   You will be contacted by our office prior to your procedure for directions on holding your Plavix.  If you do not hear from our office 1 week prior to your scheduled procedure, please call [phone] to discuss.    I appreciate the opportunity to care for you. Silvano Rusk, MD, [facility]

## 2016-11-19 NOTE — Telephone Encounter (Signed)
Dr Lysle Rubens faxed [place] back a note saying that Sydney [name] can hold her Plavix for 5 days prior to the procedure to be done 12/02/16.  I called and informed both her daughter Sydney [name] and Sydney [name].  They both verbalized understanding that her last dose of Plavix will be 11/26/16 and that Dr Carlean Purl will tell her when to restart it.

## 2016-11-24 ENCOUNTER — Ambulatory Visit: Payer: Medicare Other | Admitting: Internal Medicine

## 2016-11-30 ENCOUNTER — Telehealth: Payer: Self-pay | Admitting: Internal Medicine

## 2016-11-30 NOTE — Telephone Encounter (Signed)
Spoke with daugher [name] and questions answered.

## 2016-12-01 ENCOUNTER — Encounter (HOSPITAL_COMMUNITY): Payer: Self-pay | Admitting: *Deleted

## 2016-12-01 NOTE — Progress Notes (Signed)
Spoke with pt and pt's daughter, Sydney [name] for pre-op call. Pt denies cardiac history, chest pain or sob. Pt denies being diabetic.   Echo - 04/09/15 - in EPIC CXR - 10/15/16 - in EPIC EKG - 10/15/16 - in EPIC  Pt's last dose of Plavix was 11/26/16, stopped Florastor on 11/29/16, last dose of Dificid was 11/30/16 in the AM.

## 2016-12-01 NOTE — Anesthesia Preprocedure Evaluation (Addendum)
Anesthesia Evaluation  Patient identified by MRN, date of birth, ID band Patient awake    Reviewed: Allergy & Precautions, NPO status , Patient's Chart, lab work & pertinent test results  Airway Mallampati: II  TM Distance: >3 FB Neck ROM: Full    Dental no notable dental hx. (+) Edentulous Upper, Edentulous Lower, Dental Advisory Given   Pulmonary neg pulmonary ROS,    Pulmonary exam normal breath sounds clear to auscultation       Cardiovascular hypertension, Pt. on medications + Peripheral Vascular Disease  negative cardio ROS Normal cardiovascular exam Rhythm:Regular Rate:Normal     Neuro/Psych TIA Neuromuscular disease negative neurological ROS  negative psych ROS   GI/Hepatic negative GI ROS, Neg liver ROS,   Endo/Other  negative endocrine ROS  Renal/GU negative Renal ROS  negative genitourinary   Musculoskeletal negative musculoskeletal ROS (+) Arthritis , Osteoarthritis,    Abdominal   Peds negative pediatric ROS (+)  Hematology negative hematology ROS (+)   Anesthesia Other Findings ECHO 10/16 - Left ventricle: The cavity size was normal. Systolic function was   normal. The estimated ejection fraction was in the range of 55%   to 60%. Wall motion was normal; there were no regional wall   motion abnormalities. There was an increased relative   contribution of atrial contraction to ventricular filling.   Doppler parameters are consistent with abnormal left ventricular   relaxation (grade 1 diastolic dysfunction). - Mitral valve: There was mild regurgitation. - Tricuspid valve: There was mild regurgitation. - Pericardium, extracardiac: A small, free-flowing pericardial   effusion was identified circumferential to the heart. The fluid   had no internal echoes.There was no evidence of hemodynamic   compromise.  Bradycardia Occlusion and stenosis of carotid artery without mention of cerebral  infarction Cranial nerve IV palsy Diplopia Leukopenia Meningioma (HCC) Hypokalemia Anemia Hyponatremia Glaucoma C. difficile colitis Physical deconditioning Recurrent Clostridium difficile diarrhea Balance problem History of transient ischemic attack (TIA) Hypertension High cholesterol Recurrent colitis due to Clostridium difficile Dehydration with hyponatremia Acute hypokalemia Protein calorie malnutrition (HCC) C. difficile diarrhea Protein-calorie malnutrition, severe    Reproductive/Obstetrics negative OB ROS                            Anesthesia Physical Anesthesia Plan  ASA: III  Anesthesia Plan: MAC   Post-op Pain Management:    Induction: Intravenous  PONV Risk Score and Plan: 2 and Ondansetron, Dexamethasone, Treatment may vary due to age or medical condition and Midazolam  Airway Management Planned: Mask and Natural Airway  Additional Equipment:   Intra-op Plan:   Post-operative Plan:   Informed Consent: I have reviewed the patients History and Physical, chart, labs and discussed the procedure including the risks, benefits and alternatives for the proposed anesthesia with the patient or authorized representative who has indicated his/her understanding and acceptance.   Dental advisory given  Plan Discussed with: CRNA, Anesthesiologist and Surgeon  Anesthesia Plan Comments:        Anesthesia Quick Evaluation

## 2016-12-02 ENCOUNTER — Ambulatory Visit (HOSPITAL_COMMUNITY): Payer: Medicare Other | Admitting: Anesthesiology

## 2016-12-02 ENCOUNTER — Encounter (HOSPITAL_COMMUNITY): Payer: Self-pay | Admitting: *Deleted

## 2016-12-02 ENCOUNTER — Encounter (HOSPITAL_COMMUNITY)
Admission: RE | Disposition: A | Discharge status: Home / Self Care | Payer: Self-pay | Source: Ambulatory Visit | Attending: Internal Medicine

## 2016-12-02 ENCOUNTER — Telehealth: Payer: Self-pay

## 2016-12-02 ENCOUNTER — Ambulatory Visit (HOSPITAL_COMMUNITY)
Admission: RE | Admit: 2016-12-02 | Discharge: 2016-12-02 | Disposition: A | Discharge status: Home / Self Care | Payer: Medicare Other | Source: Ambulatory Visit | Attending: Internal Medicine | Admitting: Internal Medicine

## 2016-12-02 DIAGNOSIS — Z8673 Personal history of transient ischemic attack (TIA), and cerebral infarction without residual deficits: Secondary | ICD-10-CM | POA: Insufficient documentation

## 2016-12-02 DIAGNOSIS — E7800 Pure hypercholesterolemia, unspecified: Secondary | ICD-10-CM | POA: Diagnosis not present

## 2016-12-02 DIAGNOSIS — A0471 Enterocolitis due to Clostridium difficile, recurrent: Secondary | ICD-10-CM | POA: Diagnosis not present

## 2016-12-02 DIAGNOSIS — Z7982 Long term (current) use of aspirin: Secondary | ICD-10-CM | POA: Insufficient documentation

## 2016-12-02 DIAGNOSIS — I10 Essential (primary) hypertension: Secondary | ICD-10-CM | POA: Insufficient documentation

## 2016-12-02 DIAGNOSIS — Z7902 Long term (current) use of antithrombotics/antiplatelets: Secondary | ICD-10-CM | POA: Insufficient documentation

## 2016-12-02 DIAGNOSIS — Z79899 Other long term (current) drug therapy: Secondary | ICD-10-CM | POA: Insufficient documentation

## 2016-12-02 HISTORY — PX: COLONOSCOPY WITH PROPOFOL: SHX5780

## 2016-12-02 HISTORY — DX: Anemia, unspecified: D64.9

## 2016-12-02 HISTORY — PX: FECAL TRANSPLANT: SHX6383

## 2016-12-02 SURGERY — COLONOSCOPY WITH PROPOFOL
Anesthesia: Monitor Anesthesia Care

## 2016-12-02 MED ORDER — SODIUM CHLORIDE 0.9 % IV SOLN: INTRAVENOUS | Status: DC

## 2016-12-02 MED ORDER — SODIUM CHLORIDE 0.9 % IV SOLN
INTRAVENOUS | Status: DC
  Administered 2016-12-02: 08:00:00 via INTRAVENOUS

## 2016-12-02 MED ORDER — ONDANSETRON 4 MG PO TBDP
4.0000 mg | ORAL_TABLET | Freq: Once | ORAL | Status: DC
  Filled 2016-12-02 (×2): qty 1

## 2016-12-02 MED ORDER — LOPERAMIDE HCL 2 MG PO CAPS
4.0000 mg | ORAL_CAPSULE | Freq: Once | ORAL | Status: AC
  Administered 2016-12-02: 4 mg via ORAL
  Filled 2016-12-02: qty 2

## 2016-12-02 MED ORDER — SODIUM CHLORIDE 0.9 % IV BOLUS (SEPSIS): 1000.0000 mL | Freq: Once | INTRAVENOUS | Status: DC

## 2016-12-02 MED ORDER — FENTANYL CITRATE (PF) 100 MCG/2ML IJ SOLN: 25.0000 ug | INTRAMUSCULAR | Status: DC | PRN

## 2016-12-02 MED ORDER — PHENYLEPHRINE HCL 10 MG/ML IJ SOLN
INTRAMUSCULAR | Status: DC | PRN
  Administered 2016-12-02: 40 ug via INTRAVENOUS
  Administered 2016-12-02: 80 ug via INTRAVENOUS

## 2016-12-02 MED ORDER — PROPOFOL 500 MG/50ML IV EMUL
INTRAVENOUS | Status: DC | PRN
  Administered 2016-12-02: 75 ug/kg/min via INTRAVENOUS

## 2016-12-02 MED ORDER — LACTATED RINGERS IV SOLN
INTRAVENOUS | Status: DC
  Administered 2016-12-02: 1000 mL via INTRAVENOUS

## 2016-12-02 SURGICAL SUPPLY — 21 items

## 2016-12-02 NOTE — Progress Notes (Signed)
Per Dr. Carlean Purl, restart IV with NS, PA to come and assess and admit for observation.

## 2016-12-02 NOTE — Progress Notes (Signed)
   Patient was orthostatic so given 1 L NS bolus, she ate and was Asx and orthostatic BP still present but much better and no sxs DC home

## 2016-12-02 NOTE — Progress Notes (Signed)
This note also relates to the following rows which could not be included: ECG Heart Rate - Cannot attach notes to unvalidated device data Resp - Cannot attach notes to unvalidated device data  Sitting blood pressure

## 2016-12-02 NOTE — Interval H&P Note (Signed)
History and Physical Interval Note:  12/02/2016 9:04 AM  Sydney [name]  has presented today for surgery, with the diagnosis of C, Diff  The various methods of treatment have been discussed with the patient and family. After consideration of risks, benefits and other options for treatment, the patient has consented to  Procedure(s): COLONOSCOPY WITH PROPOFOL (N/A) FECAL TRANSPLANT (N/A) as a surgical intervention .  The patient's history has been reviewed, patient examined, no change in status, stable for surgery.  I have reviewed the patient's chart and labs.  Questions were answered to the patient's satisfaction.     Silvano Rusk

## 2016-12-02 NOTE — H&P (View-Only) (Signed)
Sydney [name] 83 y.o. [birth year] [id number]  Assessment & Plan:   Encounter Diagnoses  Name Primary?  . Recurrent colitis due to Clostridium difficile   . Encounter for long-term use of antiplatelets/antithrombotics Yes    Currently doing well on dificid every 3 days. She will continue that and we will plan a colonoscopy with fecal might robotic a transplant using the open biome specimen in early August. Coordinate with [name] of infectious disease.The risks and benefits as well as alternatives of endoscopic procedure(s) have been discussed and reviewed. All questions answered. The patient agrees to proceed.   I have instructed her to stop Dificid and Florastor 48 hours prior to the colonoscopy and transplant. She was also instructed to take 2 Imodium tablets 4 hours prior to her colonoscopy. Anticipate giving some afterwards as well.  Though the risk of bleeding with the colonoscopy as low I think it's reasonable to hold her clopidogrel because the indication is remote history of TIA. I did review the very rare but real risk of having another TIA or stroke off the clopidogrel but we will continue her aspirin. Will clarify with her primary care provider about this as well.  I appreciate the opportunity to care for this patient.  CC: [name], MD  Dr. Carlyle Basques   Subjective:   Chief Complaint: Recurrent C. difficile colitis  HPI Is a very nice lady here with her daughter, because of recurrent C. difficile colitis being followed and treated by Dr. Carlyle Basques. Multiple episodes since January of this year. The plan is for a fecal microbiologic a transplant. The patient is currently asymptomatic on her current medication regimen. She has lost weight and is been through a tough time with malnutrition etc. related to the illnesses. He has a remote history of a colonoscopy probably over 10 years ago. She is not sure who did it though it might have been Dr. Earle Gell. No history of significant gastrointestinal illnesses other than the C. difficile. Allergies  Allergen Reactions  . Demerol [Meperidine] Other (See Comments)    Excessive sweating, cramping  . Codeine Rash  . Sulfa Antibiotics Other (See Comments)    Reaction unknown  . Tomato Itching   Current Meds  Medication Sig  . aspirin (ECOTRIN LOW STRENGTH) 81 MG EC tablet Take 81 mg by mouth daily. Swallow whole.  . clopidogrel (PLAVIX) 75 MG tablet Take 75 mg by mouth daily.  . fidaxomicin (DIFICID) 200 MG TABS tablet Take 1 tablet (200 mg total) by mouth 2 (two) times daily.  . fluticasone (FLONASE) 50 MCG/ACT nasal spray Place 2 sprays into both nostrils daily as needed for allergies or rhinitis.  . folic acid (FOLVITE) 1 MG tablet Take 1 mg by mouth daily.  Marland Kitchen latanoprost (XALATAN) 0.005 % ophthalmic solution Place 1 drop into both eyes at bedtime.   . saccharomyces boulardii (FLORASTOR) 250 MG capsule Take 250 mg by mouth 2 (two) times daily.   Past Medical History:  Diagnosis Date  . Arthritis    "left hand" (02/17/2013)  . C. difficile diarrhea   . Glaucoma   . High cholesterol    "on RX years ago" (02/17/2013)  . Hypertension   . Meningioma ([place]) 05/01/2015  . Osteoporosis 02/2014   T score -2.5  followed by Dr. Lysle Rubens  . Palpitations    PACs, PVCs and short runs of atrial tachycardia on Holter monitoring  . TIA (transient ischemic attack) 1990's   Past Surgical History:  Procedure  Laterality Date  . ANAL FISTULECTOMY  1970  . TONSILLECTOMY     Social History   Social History  . Marital status: Widowed    Spouse name: N/A  . Number of children: 1  . Years of education: 66   Occupational History  . retired    Social History Main Topics  . Smoking status: Never Smoker  . Smokeless tobacco: Never Used  . Alcohol use No  . Drug use: No  . Sexual activity: No     Comment: 1st intercourse 83 yo-Fewer than 5 partners   Other Topics Concern  . Not on file    Social History Narrative   Patient does not drink caffeine.   Patient is right handed.    family history includes Heart attack in her father; Hypertension in her father; Stroke in her brother and maternal grandmother.   Review of Systems As per history of present illness. She's been somewhat weak.  Objective:   Physical Exam BP (!) 92/58   Pulse 72   Ht 5\' 3"  (1.6 m)   Wt 97 lb 6.4 oz (44.2 kg)   BMI 17.25 kg/m  Thin black woman no acute distress Eyes anicteric Lungs clear Heart S1-S2 no rubs murmurs or gallops are heard Abdomen is soft nontender no masses

## 2016-12-02 NOTE — Op Note (Signed)
[facility] Patient Name: Sydney [name] Procedure Date : 12/02/2016 MRN: [id number] Attending MD: Gatha Mayer , MD Date of Birth: 11-04-1930 CSN: [id number] Age: 83 Admit Type: Outpatient Procedure:                Colonoscopy Indications:              Fecal transplant for treatment of recurrent                            Clostridium difficile colitis Providers:                Gatha Mayer, MD, Lillie Fragmin, RN, Marcene Duos, Technician Referring MD:              Medicines:                Propofol per Anesthesia, Monitored Anesthesia Care Complications:            No immediate complications. Estimated Blood Loss:     Estimated blood loss: none. Procedure:                Pre-Anesthesia Assessment:                           - Prior to the procedure, a History and Physical                            was performed, and patient medications and                            allergies were reviewed. The patient's tolerance of                            previous anesthesia was also reviewed. The risks                            and benefits of the procedure and the sedation                            options and risks were discussed with the patient.                            All questions were answered, and informed consent                            was obtained. Prior Anticoagulants: The patient                            last took Plavix (clopidogrel) 5 days prior to the                            procedure. ASA Grade Assessment: III - A patient  with severe systemic disease. After reviewing the                            risks and benefits, the patient was deemed in                            satisfactory condition to undergo the procedure.                           After obtaining informed consent, the colonoscope                            was passed under direct vision. Throughout the   procedure, the patient's blood pressure, pulse, and                            oxygen saturations were monitored continuously. The                            Colonoscope was introduced through the anus and                            advanced to the the cecum, identified by                            appendiceal orifice and ileocecal valve. The                            colonoscopy was performed without difficulty. The                            patient tolerated the procedure well. The quality                            of the bowel preparation was good. The appendiceal                            orifice and the rectum were photographed. The bowel                            preparation used was Miralax. Scope In: 9:16:43 AM Scope Out: 9:27:57 AM Scope Withdrawal Time: 0 hours 5 minutes 4 seconds  Total Procedure Duration: 0 hours 11 minutes 14 seconds  Findings:      The perianal and digital rectal examinations were normal.      The colon (entire examined portion) appeared normal.      The retroflexed view of the distal rectum and anal verge was normal and       showed no anal or rectal abnormalities.      Fecal Microbiota Transplant (Bacteriotherapy): Donor stool was prepared       using saline as per protocol. Approximately 300 mL of the emulsified       donor stool was instilled in the cecum. A detailed colonoscopic exam       could not be performed upon scope withdrawal secondary to limited  visibility from the instilled stool. Impression:               - The entire examined colon is normal. LIMITED                            VIEWS MOSTLY UPON INSERTION                           - The distal rectum and anal verge are normal on                            retroflexion view.                           - Fecal Microbiota Transplant (Bacteriotherapy)                            performed in the cecum.                           - No specimens collected. Moderate Sedation:       Please see anesthesia notes, moderate sedation not given Recommendation:           - Patient has a contact number available for                            emergencies. The signs and symptoms of potential                            delayed complications were discussed with the                            patient. Return to normal activities tomorrow.                            Written discharge instructions were provided to the                            patient.                           - Resume previous diet.                           - Continue present medications.                           - Resume Plavix (clopidogrel) at prior dose today.                           - No repeat colonoscopy.                           - Return to [name] in 2 mos                           See Dr. Carlean Purl  as needed Procedure Code(s):        --- Professional ---                           [id number], Colonoscopy, flexible; diagnostic, including                            collection of specimen(s) by brushing or washing,                            when performed (separate procedure) Diagnosis Code(s):        --- Professional ---                           A04.7, Enterocolitis due to Clostridium difficile CPT copyright 2016 [employer]. All rights reserved. The codes documented in this report are preliminary and upon coder review may  be revised to meet current compliance requirements. Gatha Mayer, MD 12/02/2016 9:47:03 AM This report has been signed electronically. Number of Addenda: 0

## 2016-12-02 NOTE — Anesthesia Procedure Notes (Signed)
Procedure Name: MAC Date/Time: 12/02/2016 9:10 AM Performed by: Carney Living Patient Re-evaluated:Patient Re-evaluated prior to induction Oxygen Delivery Method: Nasal cannula

## 2016-12-02 NOTE — Transfer of Care (Signed)
Immediate Anesthesia Transfer of Care Note  Patient: Sydney [name]  Procedure(s) Performed: Procedure(s): COLONOSCOPY WITH PROPOFOL (N/A) FECAL TRANSPLANT (N/A)  Patient Location: Endoscopy Unit  Anesthesia Type:MAC  Level of Consciousness: awake, alert , oriented and patient cooperative  Airway & Oxygen Therapy: Patient Spontanous Breathing and Patient connected to nasal cannula oxygen  Post-op Assessment: Report given to RN, Post -op Vital signs reviewed and stable and Patient moving all extremities X 4  Post vital signs: Reviewed and stable  Last Vitals:  Vitals:   12/02/16 0755 12/02/16 0936  BP: 116/70 (!) 106/45  Pulse: (!) 53 (!) 50  Resp: 11 18  Temp: 36.5 C 36.4 C    Last Pain:  Vitals:   12/02/16 0936  TempSrc: Oral         Complications: No apparent anesthesia complications

## 2016-12-02 NOTE — Progress Notes (Signed)
Patient's vital signs continue to be stable post syncopal episode. Patient denies s/s nausea, dizziness.  Took BP lying, sitting and standing.  Patient is orthostatic.  Orthostatic BPs see flowsheet.  Patient denies s/s.  Assisted back to bed and paged Dr. Carlean Purl.

## 2016-12-02 NOTE — Discharge Instructions (Addendum)
° °  Everything went according to plan. If you have diarrhea again (more than a couple of times - watery stools) then let me or [name] know.  Restart Plavix (clopidogrel) today and DO NOT take any antibiotics or probiotics unless [name] or I say its ok for the next few weeks or so unless its an emergency situation.  My office will call you to see how you are tomorrow.  I appreciate the opportunity to care for you. Gatha Mayer, MD, FACG    YOU HAD AN ENDOSCOPIC PROCEDURE TODAY: Refer to the procedure report and other information in the discharge instructions given to you for any specific questions about what was found during the examination. If this information does not answer your questions, please call [facility] office at [phone] to clarify.   YOU SHOULD EXPECT: Some feelings of bloating in the abdomen. Passage of more gas than usual. Walking can help get rid of the air that was put into your GI tract during the procedure and reduce the bloating. If you had a lower endoscopy (such as a colonoscopy or flexible sigmoidoscopy) you may notice spotting of blood in your stool or on the toilet paper. Some abdominal soreness may be present for a day or two, also.  DIET: Your first meal following the procedure should be a light meal and then it is ok to progress to your normal diet. A half-sandwich or bowl of soup is an example of a good first meal. Heavy or fried foods are harder to digest and may make you feel nauseous or bloated. Drink plenty of fluids but you should avoid alcoholic beverages for 24 hours. If you had a esophageal dilation, please see attached instructions for diet.    ACTIVITY: Your care partner should take you home directly after the procedure. You should plan to take it easy, moving slowly for the rest of the day. You can resume normal activity the day after the procedure however YOU SHOULD NOT DRIVE, use power tools, machinery or perform tasks that involve climbing  or major physical exertion for 24 hours (because of the sedation medicines used during the test).   SYMPTOMS TO REPORT IMMEDIATELY: A gastroenterologist can be reached at any hour. Please call [phone]  for any of the following symptoms:  Following lower endoscopy (colonoscopy, flexible sigmoidoscopy) Excessive amounts of blood in the stool  Significant tenderness, worsening of abdominal pains  Swelling of the abdomen that is new, acute  Fever of 100 or higher

## 2016-12-02 NOTE — Progress Notes (Signed)
Pt requested to go to bathroom.  Dr. Carlean Purl aware.  Patient removed from monitor and assisted to bathroom.  Patient ambulated with minimal assistance.  Daughter stayed with patient.  Per daughter, as she assisted the patient to stand to wipe and flush the toilet the patient stated "I don't feel well".  She let her sit back on the toilet.  As the patient sat down she passed out.  Daughter called out for help.  Patient found sitting on toilet passed out and being held upright by daughter.  Dr. Ambrose Pancoast and CRNA were walking through and assisted with transferring patient to blanket on floor and lifted to a stretcher.  As patient was lifted and placed on the stretcher she began to arouse.  Placed back on monitor VSS pt stated she was nauseous.  The nausea passed within 5 minutes and patient states the feels better.  Dr. Ambrose Pancoast assess patient before leaving. Pt still states need to go to the bathroom.  Placed on bed pan.  No further s/s of syncope VSS.  Dr. Carlean Purl in to assess.

## 2016-12-02 NOTE — Telephone Encounter (Signed)
-----   Message from Hulan Saas, RN sent at 12/02/2016 10:46 AM EDT ----- Regarding: FW: please call tomorrow   ----- Message ----- From: Gatha Mayer, MD Sent: 12/02/2016   9:47 AM To: Hulan Saas, RN Subject: please call tomorrow                           Please call her to see how she is after the fecal transplant I did today  ? Any diarrhea or other problems and let me know  Thanks  CEG

## 2016-12-02 NOTE — Anesthesia Postprocedure Evaluation (Signed)
Anesthesia Post Note  Patient: ADASYN MCADAMS  Procedure(s) Performed: Procedure(s) (LRB): COLONOSCOPY WITH PROPOFOL (N/A) FECAL TRANSPLANT (N/A)     Patient location during evaluation: PACU Anesthesia Type: MAC Level of consciousness: awake and alert Pain management: pain level controlled Vital Signs Assessment: post-procedure vital signs reviewed and stable Respiratory status: spontaneous breathing, nonlabored ventilation, respiratory function stable and patient connected to nasal cannula oxygen Cardiovascular status: stable and blood pressure returned to baseline Anesthetic complications: no    Last Vitals:  Vitals:   12/02/16 1000 12/02/16 1010  BP: 121/62 126/61  Pulse: (!) 56 64  Resp: 17 18  Temp:      Last Pain:  Vitals:   12/02/16 0936  TempSrc: Oral                 Javar Eshbach

## 2016-12-03 ENCOUNTER — Other Ambulatory Visit: Payer: Self-pay

## 2016-12-03 ENCOUNTER — Encounter (HOSPITAL_COMMUNITY): Payer: Self-pay | Admitting: Emergency Medicine

## 2016-12-03 ENCOUNTER — Emergency Department (HOSPITAL_COMMUNITY)
Admission: EM | Admit: 2016-12-03 | Discharge: 2016-12-03 | Disposition: A | Discharge status: Home / Self Care | Payer: Medicare Other | Attending: Emergency Medicine | Admitting: Emergency Medicine

## 2016-12-03 DIAGNOSIS — Z7982 Long term (current) use of aspirin: Secondary | ICD-10-CM | POA: Insufficient documentation

## 2016-12-03 DIAGNOSIS — Z7902 Long term (current) use of antithrombotics/antiplatelets: Secondary | ICD-10-CM | POA: Insufficient documentation

## 2016-12-03 DIAGNOSIS — Z79899 Other long term (current) drug therapy: Secondary | ICD-10-CM | POA: Diagnosis not present

## 2016-12-03 DIAGNOSIS — E876 Hypokalemia: Secondary | ICD-10-CM | POA: Diagnosis not present

## 2016-12-03 DIAGNOSIS — R531 Weakness: Secondary | ICD-10-CM | POA: Diagnosis present

## 2016-12-03 DIAGNOSIS — E860 Dehydration: Secondary | ICD-10-CM | POA: Diagnosis not present

## 2016-12-03 DIAGNOSIS — I10 Essential (primary) hypertension: Secondary | ICD-10-CM | POA: Diagnosis not present

## 2016-12-03 LAB — CBC
HCT: 31.2 % — ABNORMAL LOW (ref 36.0–46.0)
Hemoglobin: 10.8 g/dL — ABNORMAL LOW (ref 12.0–15.0)
MCH: 30.9 pg (ref 26.0–34.0)
MCHC: 34.6 g/dL (ref 30.0–36.0)
MCV: 89.4 fL (ref 78.0–100.0)
PLATELETS: 190 10*3/uL (ref 150–400)
RBC: 3.49 MIL/uL — AB (ref 3.87–5.11)
RDW: 14.1 % (ref 11.5–15.5)
WBC: 3.2 10*3/uL — AB (ref 4.0–10.5)

## 2016-12-03 LAB — I-STAT CHEM 8, ED
BUN: 13 mg/dL (ref 6–20)
CHLORIDE: 107 mmol/L (ref 101–111)
CREATININE: 0.6 mg/dL (ref 0.44–1.00)
Calcium, Ion: 1.27 mmol/L (ref 1.15–1.40)
GLUCOSE: 92 mg/dL (ref 65–99)
HEMATOCRIT: 32 % — AB (ref 36.0–46.0)
Hemoglobin: 10.9 g/dL — ABNORMAL LOW (ref 12.0–15.0)
Potassium: 2.9 mmol/L — ABNORMAL LOW (ref 3.5–5.1)
Sodium: 137 mmol/L (ref 135–145)
TCO2: 18 mmol/L (ref 0–100)

## 2016-12-03 LAB — URINALYSIS, ROUTINE W REFLEX MICROSCOPIC
Bilirubin Urine: NEGATIVE
GLUCOSE, UA: NEGATIVE mg/dL
Ketones, ur: NEGATIVE mg/dL
Leukocytes, UA: NEGATIVE
Nitrite: NEGATIVE
PH: 6 (ref 5.0–8.0)
PROTEIN: NEGATIVE mg/dL
SPECIFIC GRAVITY, URINE: 1.004 — AB (ref 1.005–1.030)

## 2016-12-03 LAB — BASIC METABOLIC PANEL
Anion gap: 7 (ref 5–15)
BUN: 14 mg/dL (ref 6–20)
CALCIUM: 8.6 mg/dL — AB (ref 8.9–10.3)
CO2: 18 mmol/L — AB (ref 22–32)
CREATININE: 0.66 mg/dL (ref 0.44–1.00)
Chloride: 110 mmol/L (ref 101–111)
GFR calc non Af Amer: >60 mL/min (ref >60)
Glucose, Bld: 93 mg/dL (ref 65–99)
Potassium: 2.8 mmol/L — ABNORMAL LOW (ref 3.5–5.1)
SODIUM: 135 mmol/L (ref 135–145)

## 2016-12-03 LAB — CBG MONITORING, ED: Glucose-Capillary: 120 mg/dL — ABNORMAL HIGH (ref 65–99)

## 2016-12-03 MED ORDER — MAGNESIUM OXIDE 400 (241.3 MG) MG PO TABS
800.0000 mg | ORAL_TABLET | Freq: Once | ORAL | Status: AC
  Administered 2016-12-03: 800 mg via ORAL
  Filled 2016-12-03: qty 2

## 2016-12-03 MED ORDER — SODIUM CHLORIDE 0.9 % IV BOLUS (SEPSIS)
1000.0000 mL | Freq: Once | INTRAVENOUS | Status: AC
  Administered 2016-12-03: 1000 mL via INTRAVENOUS

## 2016-12-03 MED ORDER — POTASSIUM CHLORIDE CRYS ER 20 MEQ PO TBCR
40.0000 meq | EXTENDED_RELEASE_TABLET | Freq: Once | ORAL | Status: AC
  Administered 2016-12-03: 40 meq via ORAL
  Filled 2016-12-03: qty 2

## 2016-12-03 NOTE — Telephone Encounter (Signed)
Patient daughter states that after procedure yesterday patient has had normal BM but is feeling really weak and unbalanced. Daughter requesting a call back to discuss.

## 2016-12-03 NOTE — Telephone Encounter (Signed)
Spoke to daughter and advised she take mom to [facility] ED Patient had standing BP systolic 48'E - mentating fine but wobbly when she walks  No abdominal pain

## 2016-12-03 NOTE — Telephone Encounter (Signed)
Dr Carlean Purl the pt's daughter called to advise you the pt had a normal BM yesterday but is off balance and weak today.  She would like a call to discuss.

## 2016-12-03 NOTE — ED Triage Notes (Signed)
Pt complaint of generalized weakness post colonoscopy yesterday; denies n/v/d or pain.

## 2016-12-03 NOTE — ED Provider Notes (Signed)
[name] DEPT Provider Note   CSN: [id number] Arrival date & time: 12/03/16  1309     History   Chief Complaint Chief Complaint  Patient presents with  . Weakness    HPI Sydney [name] is a 83 y.o. female.  83 yo F with a chief complaints of weakness. This been going on for the past day since Sydney [name] had a bowel prep for colonoscopy yesterday. Sydney [name] is noted to be orthostatic in the procedure room post procedure and was given a liter of fluids with some improvement and sent home. Since then Sydney [name] has continued to feel a little lightheaded when standing. Denies chest pain or shortness of breath. Denies vomiting or diarrhea. Denies abdominal pain.   The history is provided by the patient and the spouse.  Illness  This is a new problem. The current episode started 2 days ago. The problem occurs constantly. The problem has not changed since onset.Pertinent negatives include no chest pain, no headaches and no shortness of breath. Nothing aggravates the symptoms. Nothing relieves the symptoms. Sydney [name] has tried nothing for the symptoms. The treatment provided no relief.    Past Medical History:  Diagnosis Date  . Anemia    years ago  . Arthritis    "left hand" (02/17/2013)  . C. difficile diarrhea   . Glaucoma   . High cholesterol    "on RX years ago" (02/17/2013)  . Hypertension   . Meningioma ([place]) 05/01/2015  . Osteoporosis 02/2014   T score -2.5  followed by Dr. Lysle Rubens  . Palpitations    PACs, PVCs and short runs of atrial tachycardia on Holter monitoring  . TIA (transient ischemic attack) 1990's    Patient Active Problem List   Diagnosis Date Noted  . Protein-calorie malnutrition, severe 10/20/2016  . C. difficile diarrhea   . Hypertension 10/17/2016  . High cholesterol 10/17/2016  . Recurrent colitis due to Clostridium difficile 10/17/2016  . Dehydration with hyponatremia 10/17/2016  . Acute hypokalemia 10/17/2016  . Protein calorie malnutrition ([place]) 10/17/2016  . C.  difficile colitis 10/05/2016  . Physical deconditioning 10/05/2016  . Recurrent Clostridium difficile diarrhea 10/05/2016  . Balance problem 10/05/2016  . History of transient ischemic attack (TIA) 10/05/2016  . Dehydration   . Hypokalemia 05/14/2016  . Anemia 05/14/2016  . Hyponatremia 05/14/2016  . Glaucoma 05/14/2016  . Meningioma ([place]) 05/01/2015  . 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 Diplopia 04/08/2015  . Leukopenia 04/08/2015  . Bradycardia 02/17/2013  . Occlusion and stenosis of carotid artery without mention of cerebral infarction 02/17/2013    Past Surgical History:  Procedure Laterality Date  . ANAL FISTULECTOMY  1970  . COLONOSCOPY    . TONSILLECTOMY      OB History    Gravida Para Term Preterm AB Living   1 1 1     1    SAB TAB Ectopic Multiple Live Births                   Home Medications    Prior to Admission medications   Medication Sig Start Date End Date Taking? Authorizing Provider  aspirin (ECOTRIN LOW STRENGTH) 81 MG EC tablet Take 81 mg by mouth daily. Swallow whole.   Yes [provider]  clopidogrel (PLAVIX) 75 MG tablet Take 75 mg by mouth daily.   Yes [provider]  fluticasone (FLONASE) 50 MCG/ACT nasal spray Place 2 sprays into both nostrils daily as needed for allergies or rhinitis. 10/06/16  Yes Hongalgi,  Lenis Dickinson, MD  folic acid (FOLVITE) 1 MG tablet Take 1 mg by mouth daily.   Yes [provider]  latanoprost (XALATAN) 0.005 % ophthalmic solution Place 1 drop into both eyes at bedtime.    Yes [provider]  Propylene Glycol (SYSTANE BALANCE) 0.6 % SOLN Apply 2 drops to eye 2 (two) times daily. Left eye only   Yes [provider]    Family History Family History  Problem Relation Age of Onset  . Hypertension Father   . Heart attack Father   . Stroke Brother   . Stroke Maternal Grandmother     Social History Social History  Substance Use Topics  . Smoking status: Never Smoker  .  Smokeless tobacco: Never Used  . Alcohol use No     Allergies   Demerol [meperidine]; Codeine; Sulfa antibiotics; and Tomato   Review of Systems Review of Systems  Constitutional: Negative for chills and fever.  HENT: Negative for congestion and rhinorrhea.   Eyes: Negative for redness and visual disturbance.  Respiratory: Negative for shortness of breath and wheezing.   Cardiovascular: Negative for chest pain and palpitations.  Gastrointestinal: Negative for nausea and vomiting.  Genitourinary: Negative for dysuria and urgency.  Musculoskeletal: Negative for arthralgias and myalgias.  Skin: Negative for pallor and wound.  Neurological: Positive for weakness. Negative for dizziness and headaches.     Physical Exam Updated Vital Signs BP 121/63   Pulse 73   Temp 98.4 F (36.9 C) (Oral)   Resp 16   Ht 5\' 3"  (1.6 m)   Wt 44.5 kg (98 lb)   SpO2 100%   BMI 17.36 kg/m   Physical Exam  Constitutional: Sydney [name] is oriented to person, place, and time. Sydney [name] appears well-developed and well-nourished. No distress.  HENT:  Head: Normocephalic and atraumatic.  Eyes: Pupils are equal, round, and reactive to light. EOM are normal.  Neck: Normal range of motion. Neck supple.  Cardiovascular: Normal rate and regular rhythm.  Exam reveals no gallop and no friction rub.   No murmur heard. Pulmonary/Chest: Effort normal. Sydney [name] has no wheezes. Sydney [name] has no rales.  Abdominal: Soft. Sydney [name] exhibits no distension and no mass. There is no tenderness. There is no guarding.  Musculoskeletal: Sydney [name] exhibits no edema or tenderness.  Neurological: Sydney [name] is alert and oriented to person, place, and time.  Skin: Skin is warm and dry. Sydney [name] is not diaphoretic.  Psychiatric: Sydney [name] has a normal mood and affect. Her behavior is normal.  Nursing note and vitals reviewed.    ED Treatments / Results  Labs (all labs ordered are listed, but only abnormal results are displayed) Labs Reviewed  BASIC METABOLIC PANEL -  Abnormal; Notable for the following:       Result Value   Potassium 2.8 (*)    CO2 18 (*)    Calcium 8.6 (*)    All other components within normal limits  CBC - Abnormal; Notable for the following:    WBC 3.2 (*)    RBC 3.49 (*)    Hemoglobin 10.8 (*)    HCT 31.2 (*)    All other components within normal limits  URINALYSIS, ROUTINE W REFLEX MICROSCOPIC - Abnormal; Notable for the following:    Specific Gravity, Urine 1.004 (*)    Hgb urine dipstick SMALL (*)    Bacteria, UA RARE (*)    Squamous Epithelial / LPF 0-5 (*)    All other components within normal limits  CBG MONITORING, ED -  Abnormal; Notable for the following:    Glucose-Capillary 120 (*)    All other components within normal limits  I-STAT CHEM 8, ED - Abnormal; Notable for the following:    Potassium 2.9 (*)    Hemoglobin 10.9 (*)    HCT 32.0 (*)    All other components within normal limits    EKG  EKG Interpretation None       Radiology No results found.  Procedures Procedures (including critical care time)  Medications Ordered in ED Medications  sodium chloride 0.9 % bolus 1,000 mL (0 mLs Intravenous Stopped 12/03/16 1751)  potassium chloride SA (K-DUR,KLOR-CON) CR tablet 40 mEq (40 mEq Oral Given 12/03/16 1542)  magnesium oxide (MAG-OX) tablet 800 mg (800 mg Oral Given 12/03/16 1629)     Initial Impression / Assessment and Plan / ED Course  I have reviewed the triage vital signs and the nursing notes.  Pertinent labs & imaging results that were available during my care of the patient were reviewed by me and considered in my medical decision making (see chart for details).     83 yo F with a chief complaint of weakness. This is likely secondary to her hypokalemia as well as her fluid deficit post bowel prep. Will give a liter of fluids and potassium supplementation. Do not feel that imaging is required at this time.  Patient feeling better post fluids.  Requesting d/c.    I have discussed the  diagnosis/risks/treatment options with the patient and family and believe the pt to be eligible for discharge home to follow-up with PCP. We also discussed returning to the ED immediately if new or worsening sx occur. We discussed the sx which are most concerning (e.g., sudden worsening pain, fever, inability to tolerate by mouth) that necessitate immediate return. Medications administered to the patient during their visit and any new prescriptions provided to the patient are listed below.  Medications given during this visit Medications  sodium chloride 0.9 % bolus 1,000 mL (0 mLs Intravenous Stopped 12/03/16 1751)  potassium chloride SA (K-DUR,KLOR-CON) CR tablet 40 mEq (40 mEq Oral Given 12/03/16 1542)  magnesium oxide (MAG-OX) tablet 800 mg (800 mg Oral Given 12/03/16 1629)     The patient appears reasonably screen and/or stabilized for discharge and I doubt any other medical condition or other [facility] requiring further screening, evaluation, or treatment in the ED at this time prior to discharge.    Final Clinical Impressions(s) / ED Diagnoses   Final diagnoses:  Dehydration  Hypokalemia    New Prescriptions Discharge Medication List as of 12/03/2016  4:56 PM       Deno Etienne, DO 12/04/16 9373

## 2016-12-03 NOTE — Progress Notes (Signed)
    Patient seen in ED Appreciate care. Feeling better after IVF  I will f/u with her tomorrow also.  Gatha Mayer, MD, [facility] [phone] (pager) 12/03/2016 5:43 PM

## 2016-12-03 NOTE — Discharge Instructions (Signed)
Try to eat a baked potato twice a day for the next two weeks.  You can try and mix in some avocado as well.  Follow up for recheck in the next week or so.

## 2016-12-03 NOTE — ED Notes (Signed)
Bed: WA02 Expected date:  Expected time:  Means of arrival:  Comments: [facility]

## 2016-12-03 NOTE — ED Notes (Signed)
Pt. Unable to urinate at this time. Will collect urine when pt. Void. Nurse aware.

## 2016-12-04 ENCOUNTER — Telehealth: Payer: Self-pay | Admitting: Internal Medicine

## 2016-12-04 NOTE — Telephone Encounter (Signed)
Daughter reports mom is better stronger but still has some orthostatic BP changes  BP 90's over 60 changing to 68-75/50's when standing  Pulse 70's to 80's w/ standing   She will continue to rehydrate and has f/u PCP next week  I advised she be careful rising and walking and force rehydration solution (Gatorade 2 + salt) as directed  F/u me prn

## 2016-12-06 ENCOUNTER — Encounter (HOSPITAL_COMMUNITY): Payer: Self-pay | Admitting: Internal Medicine

## 2016-12-14 ENCOUNTER — Other Ambulatory Visit: Payer: Self-pay | Admitting: *Deleted

## 2016-12-14 NOTE — Patient Outreach (Signed)
[place] [facility]) Care Management  12/14/2016  LATAYA VARNELL 20-Sep-1930 [id number]   Referral Date: [id number] Referral Source: [facility] ED CENSUS Referral Reason: High ED utilization Insurance:  Medicare part [place] attempted # screening outreach call to patient.  Patient was unavailable. Per person answering the phone will have daughter to call me back/ message left with return callback number.  Plan: RN will call patient again within 14 days.  [place] Care Management [phone]

## 2016-12-18 ENCOUNTER — Other Ambulatory Visit: Payer: Self-pay | Admitting: *Deleted

## 2016-12-18 NOTE — Patient Outreach (Signed)
[place] [facility]) Care Management  12/18/2016  Sydney [name] 1930-07-06 [id number]  RN Health Coach  Attempted #2screening  outreach call to patient.  Patient was unavailable. No voice mail pickup. PLAN: RN will call again within 14 days. [place] Care Management [phone]

## 2016-12-22 ENCOUNTER — Other Ambulatory Visit: Payer: Self-pay | Admitting: Internal Medicine

## 2016-12-22 DIAGNOSIS — Z1231 Encounter for screening mammogram for malignant neoplasm of breast: Secondary | ICD-10-CM

## 2016-12-23 ENCOUNTER — Ambulatory Visit: Payer: Self-pay | Admitting: *Deleted

## 2016-12-25 ENCOUNTER — Other Ambulatory Visit: Payer: Self-pay | Admitting: *Deleted

## 2016-12-25 ENCOUNTER — Encounter: Payer: Self-pay | Admitting: *Deleted

## 2016-12-25 NOTE — Patient Outreach (Signed)
[place] [facility]) Care Management  12/25/2016  Sydney [name] 05-24-1930 [id number]  RN Health Coach  Attempted  #3 screening  outreach call to patient.  Patient was unavailable. No voice mail pickup.  Plan: RN will send unsuccessful outreach letter with closure within 10 business days if no return response.  [place] Care Management [phone]

## 2016-12-31 ENCOUNTER — Telehealth: Payer: Self-pay | Admitting: Internal Medicine

## 2016-12-31 NOTE — Telephone Encounter (Signed)
Spoke with Sydney [name] this morning doing well. Not having any diarrhea. She is now 4 wk out since her FMT. Overall doing well with exception to having some arthritis to hand and shoulder.

## 2017-01-04 ENCOUNTER — Ambulatory Visit
Admission: RE | Admit: 2017-01-04 | Discharge: 2017-01-04 | Disposition: A | Discharge status: Home / Self Care | Payer: Medicare Other | Source: Ambulatory Visit | Attending: Internal Medicine | Admitting: Internal Medicine

## 2017-01-04 DIAGNOSIS — Z1231 Encounter for screening mammogram for malignant neoplasm of breast: Secondary | ICD-10-CM

## 2017-01-06 ENCOUNTER — Ambulatory Visit: Payer: Self-pay | Admitting: *Deleted

## 2017-01-06 ENCOUNTER — Encounter: Payer: Self-pay | Admitting: *Deleted

## 2017-01-25 ENCOUNTER — Encounter: Payer: Self-pay | Admitting: Internal Medicine

## 2017-01-25 ENCOUNTER — Ambulatory Visit (INDEPENDENT_AMBULATORY_CARE_PROVIDER_SITE_OTHER): Payer: Medicare Other | Admitting: Internal Medicine

## 2017-01-25 VITALS — BP 110/63 | HR 85 | Temp 98.1°F | Ht 64.0 in | Wt 102.0 lb

## 2017-01-25 DIAGNOSIS — R636 Underweight: Secondary | ICD-10-CM

## 2017-01-25 DIAGNOSIS — A0471 Enterocolitis due to Clostridium difficile, recurrent: Secondary | ICD-10-CM | POA: Diagnosis not present

## 2017-01-25 NOTE — Progress Notes (Signed)
RFV: follow up for cdifficile infection Patient ID: Sydney [name], female   DOB: [birth year], 83 y.o.   MRN: [id number]  HPI 83yo F with hx of recurrent cdifficile. She had fecal transplant on 4/0/9811 initially complicated by orthostatic hypotension post procedure. Thought to be due to being dehydrated from the prep for colonostocopy. Otherwise doing well . She has increased weight over the last 2 months. Getting closer to her baseline weight of 104-105. No diarrhea. She has 2 soft stools per day usually. No blood or abdominal cramping. No abtx use of late  Outpatient Encounter Prescriptions as of 01/25/2017  Medication Sig  . aspirin (ECOTRIN LOW STRENGTH) 81 MG EC tablet Take 81 mg by mouth daily. Swallow whole.  . clopidogrel (PLAVIX) 75 MG tablet Take 75 mg by mouth daily.  . fluticasone (FLONASE) 50 MCG/ACT nasal spray Place 2 sprays into both nostrils daily as needed for allergies or rhinitis.  . folic acid (FOLVITE) 1 MG tablet Take 1 mg by mouth daily.  Marland Kitchen latanoprost (XALATAN) 0.005 % ophthalmic solution Place 1 drop into both eyes at bedtime.   Marland Kitchen Propylene Glycol (SYSTANE BALANCE) 0.6 % SOLN Apply 2 drops to eye 2 (two) times daily. Left eye only   No facility-administered encounter medications on file as of 01/25/2017.      Patient Active Problem List   Diagnosis Date Noted  . Protein-calorie malnutrition, severe 10/20/2016  . C. difficile diarrhea   . Hypertension 10/17/2016  . High cholesterol 10/17/2016  . Recurrent colitis due to Clostridium difficile 10/17/2016  . Dehydration with hyponatremia 10/17/2016  . Acute hypokalemia 10/17/2016  . Protein calorie malnutrition ([place]) 10/17/2016  . C. difficile colitis 10/05/2016  . Physical deconditioning 10/05/2016  . Recurrent Clostridium difficile diarrhea 10/05/2016  . Balance problem 10/05/2016  . History of transient ischemic attack (TIA) 10/05/2016  . Dehydration   . Hypokalemia 05/14/2016  . Anemia 05/14/2016  .  Hyponatremia 05/14/2016  . Glaucoma 05/14/2016  . Meningioma ([place]) 05/01/2015  . 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 Diplopia 04/08/2015  . Leukopenia 04/08/2015  . Bradycardia 02/17/2013  . Occlusion and stenosis of carotid artery without mention of cerebral infarction 02/17/2013     Health Maintenance Due  Topic Date Due  . TETANUS/TDAP  10/[birth year]/1951  . PNA vac Low Risk Adult (1 of 2 - PCV13) 10/22/1997  . INFLUENZA VACCINE  11/25/2016     Review of Systems 12 point ros is negative Physical Exam  BP 110/63   Pulse 85   Temp 98.1 F (36.7 C)   Ht 5\' 4"  (1.626 m)   Wt 102 lb (46.3 kg)   BMI 17.51 kg/m  Physical Exam  Constitutional:  oriented to person, place, and time. appears well-developed and well-nourished. No distress.  HENT: [place]/AT, PERRLA, no scleral icterus Mouth/Throat: Oropharynx is clear and moist. No oropharyngeal exudate.  Abdominal: Soft. Bowel sounds are normal.  exhibits no distension. There is no tenderness.  Neurological: alert and oriented to person, place, and time.  Skin: Skin is warm and dry. No rash noted. No erythema.  Psychiatric: a normal mood and affect.  behavior is normal.   Lab Results  Component Value Date   LABRPR Non Reactive 05/01/2015    CBC Lab Results  Component Value Date   WBC 3.2 (L) 12/03/2016   RBC 3.49 (L) 12/03/2016   HGB 10.9 (L) 12/03/2016   HCT 32.0 (L) 12/03/2016   PLT 190 12/03/2016   MCV 89.4 12/03/2016  MCH 30.9 12/03/2016   MCHC 34.6 12/03/2016   RDW 14.1 12/03/2016   LYMPHSABS 0.8 10/13/2016   MONOABS 0.7 10/13/2016   EOSABS 0.0 10/13/2016    BMET Lab Results  Component Value Date   NA 137 12/03/2016   K 2.9 (L) 12/03/2016   CL 107 12/03/2016   CO2 18 (L) 12/03/2016   GLUCOSE 92 12/03/2016   BUN 13 12/03/2016   CREATININE 0.60 12/03/2016   CALCIUM 8.6 (L) 12/03/2016   GFRNONAA >60 12/03/2016   GFRAA >60 12/03/2016      Assessment and Plan   Refractory cdiff s/p FMT = cured near 60 day  mark. Come back as needed  Underweight= slowly gaining weight back to her baseline

## 2017-04-07 ENCOUNTER — Ambulatory Visit (INDEPENDENT_AMBULATORY_CARE_PROVIDER_SITE_OTHER): Payer: Medicare Other | Admitting: Gynecology

## 2017-04-07 ENCOUNTER — Encounter: Payer: Self-pay | Admitting: Gynecology

## 2017-04-07 VITALS — BP 116/70 | Ht 64.0 in | Wt 106.0 lb

## 2017-04-07 DIAGNOSIS — M810 Age-related osteoporosis without current pathological fracture: Secondary | ICD-10-CM

## 2017-04-07 DIAGNOSIS — N952 Postmenopausal atrophic vaginitis: Secondary | ICD-10-CM

## 2017-04-07 DIAGNOSIS — Z01411 Encounter for gynecological examination (general) (routine) with abnormal findings: Secondary | ICD-10-CM | POA: Diagnosis not present

## 2017-04-07 NOTE — Patient Instructions (Signed)
Follow-up if you are having any gynecologic issues.

## 2017-04-07 NOTE — Progress Notes (Signed)
    Sydney [name] 12/19/1930 [id number]        82 y.o.  G1P1001 for breast and pelvic exam.  Past medical history,surgical history, problem list, medications, allergies, family history and social history were all reviewed and documented as reviewed in the EPIC chart.  ROS:  Performed with pertinent positives and negatives included in the history, assessment and plan.   Additional significant findings : None   Exam: [name] assistant Vitals:   04/07/17 0940  BP: 116/70  Weight: 106 lb (48.1 kg)  Height: 5\' 4"  (1.626 m)   Body mass index is 18.19 kg/m.  General appearance:  Normal affect, orientation and appearance. Skin: Grossly normal HEENT: Without gross lesions.  No cervical or supraclavicular adenopathy. Thyroid normal.  Lungs:  Clear without wheezing, rales or rhonchi Cardiac: RR, without RMG Abdominal:  Soft, nontender, without masses, guarding, rebound, organomegaly or hernia Breasts:  Examined lying and sitting without masses, retractions, discharge or axillary adenopathy. Pelvic:  Ext, BUS, Vagina: With atrophic changes  Cervix: With atrophic changes, flush with the upper vagina  Uterus: Difficult to palpate but no gross masses or tenderness  Adnexa: Without gross masses or tenderness    Anus and perineum: Normal   Rectovaginal: Normal sphincter tone without palpated masses or tenderness.    Assessment/Plan:  83 y.o. G72P1001 female for breast and pelvic exam.   1. Postmenopausal/atrophic genital changes.  No significant hot flushes, night sweats, vaginal dryness or any vaginal bleeding.  Continue to monitor and report any issues or bleeding. 2. Colonoscopy 2018.  Having issues with diarrhea/C. difficile.  Had fecal transplant.  Doing better now. 3. Pap smear 2013.  No Pap smear done today.  No history of significant abnormal Pap smears.  Per current screening guidelines we both agree to stop screening based on age. 4. Mammography 12/2016.  Continue with annual  mammography when due.  SBE monthly reviewed.  Breast exam normal today. 5. Osteoporosis.  DEXA 2015 T score -2.5.  Followed by Dr. Deforest Hoyles.  She believes she had another one done more recently but I do not have a copy of.  She will follow up with him for ongoing bone health. 6. Health maintenance.  No routine lab work done as patient does this elsewhere.  Follow-up 1 year, sooner as needed.   [name] MD, 10:05 AM 04/07/2017

## 2017-05-23 ENCOUNTER — Emergency Department (HOSPITAL_COMMUNITY)
Admission: EM | Admit: 2017-05-23 | Discharge: 2017-05-23 | Disposition: A | Discharge status: Home / Self Care | Payer: Medicare Other | Attending: Emergency Medicine | Admitting: Emergency Medicine

## 2017-05-23 ENCOUNTER — Other Ambulatory Visit: Payer: Self-pay

## 2017-05-23 ENCOUNTER — Encounter (HOSPITAL_COMMUNITY): Payer: Self-pay | Admitting: Emergency Medicine

## 2017-05-23 DIAGNOSIS — I10 Essential (primary) hypertension: Secondary | ICD-10-CM | POA: Diagnosis not present

## 2017-05-23 DIAGNOSIS — Z7982 Long term (current) use of aspirin: Secondary | ICD-10-CM | POA: Diagnosis not present

## 2017-05-23 DIAGNOSIS — Z79899 Other long term (current) drug therapy: Secondary | ICD-10-CM | POA: Insufficient documentation

## 2017-05-23 DIAGNOSIS — E860 Dehydration: Secondary | ICD-10-CM

## 2017-05-23 DIAGNOSIS — Z7902 Long term (current) use of antithrombotics/antiplatelets: Secondary | ICD-10-CM | POA: Diagnosis not present

## 2017-05-23 DIAGNOSIS — R531 Weakness: Secondary | ICD-10-CM | POA: Diagnosis present

## 2017-05-23 LAB — I-STAT CHEM 8, ED
BUN: 15 mg/dL (ref 6–20)
CHLORIDE: 104 mmol/L (ref 101–111)
Calcium, Ion: 1.24 mmol/L (ref 1.15–1.40)
Creatinine, Ser: 0.7 mg/dL (ref 0.44–1.00)
Glucose, Bld: 80 mg/dL (ref 65–99)
HEMATOCRIT: 34 % — AB (ref 36.0–46.0)
HEMOGLOBIN: 11.6 g/dL — AB (ref 12.0–15.0)
POTASSIUM: 3.4 mmol/L — AB (ref 3.5–5.1)
Sodium: 141 mmol/L (ref 135–145)
TCO2: 25 mmol/L (ref 22–32)

## 2017-05-23 LAB — CBG MONITORING, ED: GLUCOSE-CAPILLARY: 76 mg/dL (ref 65–99)

## 2017-05-23 LAB — I-STAT TROPONIN, ED: Troponin i, poc: 0 ng/mL (ref 0.00–0.08)

## 2017-05-23 LAB — I-STAT CG4 LACTIC ACID, ED: LACTIC ACID, VENOUS: 0.76 mmol/L (ref 0.5–1.9)

## 2017-05-23 MED ORDER — SODIUM CHLORIDE 0.9 % IV BOLUS (SEPSIS)
500.0000 mL | Freq: Once | INTRAVENOUS | Status: AC
  Administered 2017-05-23: 500 mL via INTRAVENOUS

## 2017-05-23 NOTE — Discharge Instructions (Signed)
Drink plenty of fluids and follow-up with your family doctor this week.  If you feel worse P prior to then just come back

## 2017-05-23 NOTE — ED Triage Notes (Signed)
Patient here from home with complaints of weakness that started this morning. States that she was eating breakfast this am and started feeling weak. Family checked BP, 60s/40s. Denies pain. CBG 157.

## 2017-05-24 NOTE — ED Provider Notes (Signed)
[facility] DEPT Provider Note   CSN: [id number] Arrival date & time: 05/23/17  5093     History   Chief Complaint Chief Complaint  Patient presents with  . Weakness    HPI [facility] [name] is a 84 y.o. female.  Patient complains of weakness.  She states that her blood pressure is low today.  She has [name] complaints of pain fever chills cough   The history is provided by the patient. [name] language interpreter was used.  Weakness  Primary symptoms include [name] focal weakness. This is a new problem. The current episode started less than 1 hour ago. The problem has not changed since onset.There was [name] focality noted. Pertinent negatives include [name] chest pain and [name] headaches.    Past Medical History:  Diagnosis Date  . Anemia    years ago  . Arthritis    "left hand" (02/17/2013)  . C. difficile diarrhea   . Glaucoma   . High cholesterol    "on RX years ago" (02/17/2013)  . Hypertension   . Meningioma ([place]) 05/01/2015  . Osteoporosis 02/2014   T score -2.5  followed by Dr. Lysle Rubens  . Palpitations    PACs, PVCs and short runs of atrial tachycardia on Holter monitoring  . TIA (transient ischemic attack) 1990's    Patient Active Problem List   Diagnosis Date Noted  . Protein-calorie malnutrition, severe 10/20/2016  . C. difficile diarrhea   . Hypertension 10/17/2016  . High cholesterol 10/17/2016  . Recurrent colitis due to Clostridium difficile 10/17/2016  . Dehydration with hyponatremia 10/17/2016  . Acute hypokalemia 10/17/2016  . Protein calorie malnutrition ([place]) 10/17/2016  . C. difficile colitis 10/05/2016  . Physical deconditioning 10/05/2016  . Recurrent Clostridium difficile diarrhea 10/05/2016  . Balance problem 10/05/2016  . History of transient ischemic attack (TIA) 10/05/2016  . Dehydration   . Hypokalemia 05/14/2016  . Anemia 05/14/2016  . Hyponatremia 05/14/2016  . Glaucoma 05/14/2016  . Meningioma ([place]) 05/01/2015  .  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 Diplopia 04/08/2015  . Leukopenia 04/08/2015  . Bradycardia 02/17/2013  . Occlusion and stenosis of carotid artery without mention of cerebral infarction 02/17/2013    Past Surgical History:  Procedure Laterality Date  . ANAL FISTULECTOMY  1970  . COLONOSCOPY    . COLONOSCOPY WITH PROPOFOL N/A 12/02/2016   Procedure: COLONOSCOPY WITH PROPOFOL;  Surgeon: Gatha Mayer, MD;  Location: [facility] ENDOSCOPY;  Service: Endoscopy;  Laterality: N/A;  . 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 TONSILLECTOMY      OB History    Gravida Para Term Preterm AB Living   1 1 1     1    SAB TAB Ectopic Multiple Live Births                   Home Medications    Prior to Admission medications   Medication Sig Start Date End Date Taking? Authorizing Provider  aspirin (ECOTRIN LOW STRENGTH) 81 MG EC tablet Take 81 mg by mouth daily. Swallow whole.   Yes [provider]  cholecalciferol (VITAMIN D) 1000 units tablet Take 1,000 Units by mouth daily.   Yes [provider]  clopidogrel (PLAVIX) 75 MG tablet Take 75 mg by mouth daily.   Yes [provider]  fluticasone (FLONASE) 50 MCG/ACT nasal spray Place 2 sprays into both  nostrils daily as needed for allergies or rhinitis. 10/06/16  Yes Hongalgi, Lenis Dickinson, MD  folic acid (FOLVITE) 1 MG tablet Take 1 mg by mouth daily.   Yes [provider]  ketoconazole (NIZORAL) 2 % shampoo  12/09/16  Yes [provider]  latanoprost (XALATAN) 0.005 % ophthalmic solution Place 1 drop into both eyes at bedtime.    Yes [provider]  Propylene Glycol (SYSTANE BALANCE) 0.6 % SOLN Apply 2 drops to eye 2 (two) times daily. Left eye only   Yes [provider]    Family History Family History  Problem Relation Age of Onset  . Hypertension Father   . Heart attack  Father   . Stroke Brother   . Stroke Maternal Grandmother     Social History Social History   Tobacco Use  . Smoking status: Never Smoker  . Smokeless tobacco: Never Used  Substance Use Topics  . Alcohol use: [name]  . Drug use: [name]     Allergies   Demerol [meperidine]; Codeine; Sulfa antibiotics; and Tomato   Review of Systems Review of Systems  Constitutional: Negative for appetite change and fatigue.  HENT: Negative for congestion, ear discharge and sinus pressure.        Pt has general weakness  Eyes: Negative for discharge.  Respiratory: Negative for cough.   Cardiovascular: Negative for chest pain.  Gastrointestinal: Negative for abdominal pain and diarrhea.  Genitourinary: Negative for frequency and hematuria.  Musculoskeletal: Negative for back pain.  Skin: Negative for rash.  Neurological: Positive for weakness. Negative for focal weakness, seizures and headaches.  Psychiatric/Behavioral: Negative for hallucinations.     Physical Exam Updated Vital Signs BP 117/63 (BP Location: Right Arm)   Pulse 62   Temp 98.4 F (36.9 C) (Oral)   Resp 19   SpO2 100%   Physical Exam  Constitutional: She is oriented to person, place, and time. She appears well-developed.  HENT:  Head: Normocephalic.  Eyes: Conjunctivae and EOM are normal. [name] scleral icterus.  Neck: Neck supple. [name] thyromegaly present.  Cardiovascular: Normal rate and regular rhythm. Exam reveals [name] gallop and [name] friction rub.  [name] murmur heard. Pulmonary/Chest: [name] stridor. She has [name] wheezes. She has [name] rales. She exhibits [name] tenderness.  Abdominal: She exhibits [name] distension. There is [name] tenderness. There is [name] rebound.  Musculoskeletal: Normal range of motion. She exhibits [name] edema.  Lymphadenopathy:    She has [name] cervical adenopathy.  Neurological: She is oriented to person, place, and time. She exhibits normal muscle tone. Coordination normal.  Skin: [name] rash noted. [name] erythema.  Psychiatric: She  has a normal mood and affect. Her behavior is normal.     ED Treatments / Results  Labs (all labs ordered are listed, but only abnormal results are displayed) Labs Reviewed  I-STAT CHEM 8, ED - Abnormal; Notable for the following components:      Result Value   Potassium 3.4 (*)    Hemoglobin 11.6 (*)    HCT 34.0 (*)    All other components within normal limits  CBG MONITORING, ED  I-STAT CG4 LACTIC ACID, ED  I-STAT TROPONIN, ED    EKG  EKG Interpretation  Date/Time:  Sunday May 23 2017 09:21:13 EST Ventricular Rate:  58 PR Interval:    QRS Duration: 79 QT Interval:  410 QTC Calculation: 403 R Axis:   44 Text Interpretation:  Sinus rhythm Atrial premature complex Probable anteroseptal infarct, old Confirmed by Milton Ferguson [phone]) on 05/23/2017  1:28:45 PM       Radiology [name] results found.  Procedures Procedures (including critical care time)  Medications Ordered in ED Medications  sodium chloride 0.9 % bolus 500 mL (0 mLs Intravenous Stopped 05/23/17 1253)     Initial Impression / Assessment and Plan / ED Course  I have reviewed the triage vital signs and the nursing notes.  Pertinent labs & imaging results that were available during my care of the patient were reviewed by me and considered in my medical decision making (see chart for details).   Labs unremarkable including troponin chemistries lactic acid.  Patient was given IV fluids.  She felt much better and her blood pressure stabilized.  Diagnosis dehydration    Final Clinical Impressions(s) / ED Diagnoses   Final diagnoses:  Dehydration    ED Discharge Orders    None       Milton Ferguson, MD 05/24/17 [phone]

## 2017-07-19 MED FILL — PLAVIX 75 MG TABLET: 75 | 15 days supply | Qty: 15 | Fill #0

## 2017-08-03 MED FILL — PLAVIX 75 MG TABLET: 75 | 30 days supply | Qty: 30 | Fill #0

## 2017-09-06 MED FILL — PLAVIX 75 MG TABLET: 75 | 30 days supply | Qty: 30 | Fill #1

## 2017-10-02 ENCOUNTER — Observation Stay (HOSPITAL_COMMUNITY)
Admission: EM | Admit: 2017-10-02 | Discharge: 2017-10-04 | Disposition: A | Discharge status: Home / Self Care | Payer: Medicare Other | Attending: Internal Medicine | Admitting: Internal Medicine

## 2017-10-02 ENCOUNTER — Observation Stay (HOSPITAL_COMMUNITY): Payer: Medicare Other

## 2017-10-02 ENCOUNTER — Emergency Department (HOSPITAL_COMMUNITY): Payer: Medicare Other

## 2017-10-02 ENCOUNTER — Other Ambulatory Visit: Payer: Self-pay

## 2017-10-02 ENCOUNTER — Encounter (HOSPITAL_COMMUNITY): Payer: Self-pay | Admitting: Emergency Medicine

## 2017-10-02 DIAGNOSIS — Z885 Allergy status to narcotic agent status: Secondary | ICD-10-CM | POA: Insufficient documentation

## 2017-10-02 DIAGNOSIS — R531 Weakness: Secondary | ICD-10-CM | POA: Insufficient documentation

## 2017-10-02 DIAGNOSIS — Z7951 Long term (current) use of inhaled steroids: Secondary | ICD-10-CM | POA: Diagnosis not present

## 2017-10-02 DIAGNOSIS — R948 Abnormal results of function studies of other organs and systems: Secondary | ICD-10-CM | POA: Diagnosis not present

## 2017-10-02 DIAGNOSIS — R1310 Dysphagia, unspecified: Secondary | ICD-10-CM | POA: Diagnosis not present

## 2017-10-02 DIAGNOSIS — R51 Headache: Secondary | ICD-10-CM | POA: Insufficient documentation

## 2017-10-02 DIAGNOSIS — E43 Unspecified severe protein-calorie malnutrition: Secondary | ICD-10-CM | POA: Insufficient documentation

## 2017-10-02 DIAGNOSIS — R270 Ataxia, unspecified: Secondary | ICD-10-CM | POA: Diagnosis not present

## 2017-10-02 DIAGNOSIS — Z7902 Long term (current) use of antithrombotics/antiplatelets: Secondary | ICD-10-CM | POA: Diagnosis not present

## 2017-10-02 DIAGNOSIS — Z7982 Long term (current) use of aspirin: Secondary | ICD-10-CM | POA: Diagnosis not present

## 2017-10-02 DIAGNOSIS — R221 Localized swelling, mass and lump, neck: Secondary | ICD-10-CM | POA: Diagnosis present

## 2017-10-02 DIAGNOSIS — I10 Essential (primary) hypertension: Secondary | ICD-10-CM | POA: Diagnosis not present

## 2017-10-02 DIAGNOSIS — G319 Degenerative disease of nervous system, unspecified: Secondary | ICD-10-CM | POA: Insufficient documentation

## 2017-10-02 DIAGNOSIS — Z91018 Allergy to other foods: Secondary | ICD-10-CM | POA: Insufficient documentation

## 2017-10-02 DIAGNOSIS — Z8249 Family history of ischemic heart disease and other diseases of the circulatory system: Secondary | ICD-10-CM | POA: Diagnosis not present

## 2017-10-02 DIAGNOSIS — Z8673 Personal history of transient ischemic attack (TIA), and cerebral infarction without residual deficits: Secondary | ICD-10-CM

## 2017-10-02 DIAGNOSIS — M1990 Unspecified osteoarthritis, unspecified site: Secondary | ICD-10-CM | POA: Diagnosis not present

## 2017-10-02 DIAGNOSIS — Z882 Allergy status to sulfonamides status: Secondary | ICD-10-CM | POA: Diagnosis not present

## 2017-10-02 DIAGNOSIS — M810 Age-related osteoporosis without current pathological fracture: Secondary | ICD-10-CM | POA: Diagnosis not present

## 2017-10-02 DIAGNOSIS — D320 Benign neoplasm of cerebral meninges: Secondary | ICD-10-CM | POA: Diagnosis not present

## 2017-10-02 DIAGNOSIS — Z79899 Other long term (current) drug therapy: Secondary | ICD-10-CM | POA: Insufficient documentation

## 2017-10-02 DIAGNOSIS — H409 Unspecified glaucoma: Secondary | ICD-10-CM | POA: Diagnosis not present

## 2017-10-02 DIAGNOSIS — R260 Ataxic gait: Principal | ICD-10-CM | POA: Insufficient documentation

## 2017-10-02 DIAGNOSIS — E785 Hyperlipidemia, unspecified: Secondary | ICD-10-CM | POA: Insufficient documentation

## 2017-10-02 DIAGNOSIS — Z681 Body mass index (BMI) 19 or less, adult: Secondary | ICD-10-CM | POA: Diagnosis not present

## 2017-10-02 LAB — CBC
HCT: 36.3 % (ref 36.0–46.0)
Hemoglobin: 11.8 g/dL — ABNORMAL LOW (ref 12.0–15.0)
MCH: 30.8 pg (ref 26.0–34.0)
MCHC: 32.5 g/dL (ref 30.0–36.0)
MCV: 94.8 fL (ref 78.0–100.0)
PLATELETS: 202 10*3/uL (ref 150–400)
RBC: 3.83 MIL/uL — ABNORMAL LOW (ref 3.87–5.11)
RDW: 13.1 % (ref 11.5–15.5)
WBC: 5.8 10*3/uL (ref 4.0–10.5)

## 2017-10-02 LAB — URINALYSIS, ROUTINE W REFLEX MICROSCOPIC
Bacteria, UA: NONE SEEN
Bilirubin Urine: NEGATIVE
Glucose, UA: NEGATIVE mg/dL
Ketones, ur: NEGATIVE mg/dL
LEUKOCYTES UA: NEGATIVE
Nitrite: NEGATIVE
Protein, ur: NEGATIVE mg/dL
Specific Gravity, Urine: 1.003 — ABNORMAL LOW (ref 1.005–1.030)
pH: 6 (ref 5.0–8.0)

## 2017-10-02 LAB — BASIC METABOLIC PANEL
Anion gap: 8 (ref 5–15)
BUN: 12 mg/dL (ref 6–20)
CALCIUM: 8.8 mg/dL — AB (ref 8.9–10.3)
CHLORIDE: 104 mmol/L (ref 101–111)
CO2: 24 mmol/L (ref 22–32)
CREATININE: 0.81 mg/dL (ref 0.44–1.00)
GFR calc Af Amer: >60 mL/min (ref >60)
GFR calc non Af Amer: >60 mL/min (ref >60)
Glucose, Bld: 94 mg/dL (ref 65–99)
Potassium: 3.6 mmol/L (ref 3.5–5.1)
Sodium: 136 mmol/L (ref 135–145)

## 2017-10-02 MED ORDER — ASPIRIN EC 81 MG PO TBEC
81.0000 mg | DELAYED_RELEASE_TABLET | Freq: Every day | ORAL | Status: DC
  Administered 2017-10-03 – 2017-10-04 (×2): 81 mg via ORAL
  Filled 2017-10-02 (×3): qty 1

## 2017-10-02 MED ORDER — PROPYLENE GLYCOL 0.6 % OP SOLN: 2.0000 [drp] | Freq: Two times a day (BID) | OPHTHALMIC | Status: DC

## 2017-10-02 MED ORDER — LATANOPROST 0.005 % OP SOLN
1.0000 [drp] | Freq: Every day | OPHTHALMIC | Status: DC
  Administered 2017-10-02 – 2017-10-03 (×2): 1 [drp] via OPHTHALMIC
  Filled 2017-10-02 (×2): qty 2.5

## 2017-10-02 MED ORDER — ACETAMINOPHEN 650 MG RE SUPP: 650.0000 mg | RECTAL | Status: DC | PRN

## 2017-10-02 MED ORDER — FLUTICASONE PROPIONATE 50 MCG/ACT NA SUSP
1.0000 | Freq: Every day | NASAL | Status: DC
  Administered 2017-10-02 – 2017-10-04 (×3): 1 via NASAL
  Filled 2017-10-02: qty 16

## 2017-10-02 MED ORDER — POLYVINYL ALCOHOL 1.4 % OP SOLN
2.0000 [drp] | Freq: Two times a day (BID) | OPHTHALMIC | Status: DC
  Administered 2017-10-02 – 2017-10-04 (×4): 2 [drp] via OPHTHALMIC
  Filled 2017-10-02 (×2): qty 15

## 2017-10-02 MED ORDER — VITAMIN D 1000 UNITS PO TABS
1000.0000 [IU] | ORAL_TABLET | Freq: Every day | ORAL | Status: DC
  Administered 2017-10-02 – 2017-10-04 (×3): 1000 [IU] via ORAL
  Filled 2017-10-02 (×4): qty 1

## 2017-10-02 MED ORDER — ACETAMINOPHEN 160 MG/5ML PO SOLN: 650.0000 mg | ORAL | Status: DC | PRN

## 2017-10-02 MED ORDER — LORATADINE 10 MG PO TABS
10.0000 mg | ORAL_TABLET | Freq: Every day | ORAL | Status: DC
  Administered 2017-10-02 – 2017-10-04 (×3): 10 mg via ORAL
  Filled 2017-10-02 (×4): qty 1

## 2017-10-02 MED ORDER — CLOPIDOGREL BISULFATE 75 MG PO TABS
75.0000 mg | ORAL_TABLET | Freq: Every day | ORAL | Status: DC
  Administered 2017-10-03 – 2017-10-04 (×2): 75 mg via ORAL
  Filled 2017-10-02 (×3): qty 1

## 2017-10-02 MED ORDER — FLUTICASONE PROPIONATE 50 MCG/ACT NA SUSP
2.0000 | Freq: Every day | NASAL | Status: DC | PRN
  Administered 2017-10-02: 2 via NASAL
  Filled 2017-10-02: qty 16

## 2017-10-02 MED ORDER — STROKE: EARLY STAGES OF RECOVERY BOOK
Freq: Once | Status: DC
  Filled 2017-10-02 (×2): qty 1

## 2017-10-02 MED ORDER — FOLIC ACID 1 MG PO TABS
1.0000 mg | ORAL_TABLET | Freq: Every day | ORAL | Status: DC
  Administered 2017-10-02 – 2017-10-04 (×3): 1 mg via ORAL
  Filled 2017-10-02 (×4): qty 1

## 2017-10-02 MED ORDER — ACETAMINOPHEN 325 MG PO TABS
650.0000 mg | ORAL_TABLET | ORAL | Status: DC | PRN
  Administered 2017-10-03: 650 mg via ORAL
  Filled 2017-10-02: qty 2

## 2017-10-02 MED ORDER — ENOXAPARIN SODIUM 40 MG/0.4ML ~~LOC~~ SOLN
40.0000 mg | SUBCUTANEOUS | Status: DC
  Administered 2017-10-02 – 2017-10-03 (×2): 40 mg via SUBCUTANEOUS
  Filled 2017-10-02 (×2): qty 0.4

## 2017-10-02 NOTE — Progress Notes (Signed)
Patient  arrived to room  From MRI she is alert and oriented, Oriented patient to unit protocol. Patient call bell within reach., Daughter at bedside.

## 2017-10-02 NOTE — H&P (Addendum)
History and Physical    [name] SAY:[id number] DOB: [birth year] DOA: 10/02/2017  **Will place patient in observation status based on the expectation that the patient will need hospitalization/ hospital care for less than or equal to 24 hours  PCP: [name], MD   Attending physician: Florencia Reasons  Patient coming from/Resides with: Private residence  Chief Complaint: Ataxia, right-sided headache and odynophagia  HPI: [facility] [name] is a 84 y.o. female with medical history significant for remote history of TIA symptoms on baby aspirin and Plavix, hypertension, dyslipidemia, osteoporosis and arthritis.  Patient was in her usual state of health until about 3 to 4 days ago when she developed a severe right-sided headache she did not have any associated numbness or tingling either to the head or extremities.  She had no motor difficulties.  In the past 24 hours she has noticed painful swallowing primarily on the right side.  She has had some sinus congestion but has not had any fevers chills or cough.  Initially presented to urgent care where initial exam of throat and neck was unremarkable and given her symptoms and noted ataxic gait there were concerns patient may be having TIA symptoms so she was sent to this facility for further evaluation and treatment.  Daughter has also noticed intermittent ataxic gait.  Patient denies any dizziness or vertiginous type symptoms since onset of headache.  Initial CT of the head unremarkable (including sinuses).  Labs are essentially unremarkable.  Case has been discussed by EDP with Dr. Cheral Marker with neurology who recommends obtaining MRI and to notify stroke team if positive for CVA.  Daughter reported that when patient was being evaluated at UC her initial SBP reading was in the 80s.  ED Course:  Vital Signs: BP 125/79 (BP Location: Right Arm)   Pulse 69   Temp 97.6 F (36.4 C)   Resp 20   Ht 5\' 3"  (1.6 m)   Wt 47.2 kg (104 lb)   SpO2 99%   BMI  18.42 kg/m  CXR/CT Head: Neg Lab data: Sodium 136, potassium 3.6, chloride 104, CO2 24, glucose 94, BUN 12, creatinine 0.81, calcium 8.8, anion gap of 8, white count 5800 differential not obtained, hemoglobin 11.8, platelets 202,000; urinalysis essentially unremarkable except for straw-colored, small hemoglobin, specific gravity 1.003 Medications and treatments: None  Review of Systems:  In addition to the HPI above,  No Fever-chills, myalgias or other constitutional symptoms No Headache, changes with Vision or hearing, new weakness, tingling, numbness in any extremity, dysarthria or word finding difficulty, tremors or seizure activity No indigestion/reflux, choking or coughing while eating, abdominal pain with or after eating No Chest pain, Cough or Shortness of Breath, palpitations, orthopnea or DOE No Abdominal pain, N/V, melena,hematochezia, dark tarry stools, constipation No dysuria, malodorous urine, hematuria or flank pain No new skin rashes, lesions, masses or bruises, No new joint pains, aches, swelling or redness No recent unintentional weight gain or loss No polyuria, polydypsia or polyphagia   Past Medical History:  Diagnosis Date  . Anemia    years ago  . Arthritis    "left hand" (02/17/2013)  . C. difficile diarrhea   . Glaucoma   . High cholesterol    "on RX years ago" (02/17/2013)  . Hypertension   . Meningioma ([place]) 05/01/2015  . Osteoporosis 02/2014   T score -2.5  followed by Dr. Lysle Rubens  . Palpitations    PACs, PVCs and short runs of atrial tachycardia on Holter monitoring  .  TIA (transient ischemic attack) 1990's    Past Surgical History:  Procedure Laterality Date  . ANAL FISTULECTOMY  1970  . COLONOSCOPY    . COLONOSCOPY WITH PROPOFOL N/A 12/02/2016   Procedure: COLONOSCOPY WITH PROPOFOL;  Surgeon: Gatha Mayer, MD;  Location[facility] ENDOSCOPY;  Service: Endoscopy;  Laterality: N/A;  . FECAL TRANSPLANT N/A 12/02/2016   Procedure: FECAL TRANSPLANT;   Surgeon: Gatha Mayer, MD;  Location: [facility] ENDOSCOPY;  Service: Endoscopy;  Laterality: N/A;  . TONSILLECTOMY      Social History   Socioeconomic History  . Marital status: Widowed    Spouse name: Not on file  . Number of children: 1  . Years of education: 54  . Highest education level: Not on file  Occupational History  . Occupation: retired  Scientific laboratory technician  . Financial resource strain: Not on file  . Food insecurity:    Worry: Not on file    Inability: Not on file  . Transportation needs:    Medical: Not on file    Non-medical: Not on file  Tobacco Use  . Smoking status: Never Smoker  . Smokeless tobacco: Never Used  Substance and Sexual Activity  . Alcohol use: No  . Drug use: No  . Sexual activity: Never    Comment: 1st intercourse 84 yo-Fewer than 5 partners  Lifestyle  . Physical activity:    Days per week: Not on file    Minutes per session: Not on file  . Stress: Not on file  Relationships  . Social connections:    Talks on phone: Not on file    Gets together: Not on file    Attends religious service: Not on file    Active member of club or organization: Not on file    Attends meetings of clubs or organizations: Not on file    Relationship status: Not on file  . Intimate partner violence:    Fear of current or ex partner: Not on file    Emotionally abused: Not on file    Physically abused: Not on file    Forced sexual activity: Not on file  Other Topics Concern  . Not on file  Social History Narrative   Patient does not drink caffeine.   Patient is right handed.     Mobility: Independent Work history: Not obtained   Allergies  Allergen Reactions  . Demerol [Meperidine] Other (See Comments)    Excessive sweating, cramping  . Codeine Rash  . Sulfa Antibiotics Other (See Comments)    Reaction unknown  . Tomato Itching    Family History  Problem Relation Age of Onset  . Hypertension Father   . Heart attack Father   . Stroke Brother   .  Stroke Maternal Grandmother      Prior to Admission medications   Medication Sig Start Date End Date Taking? Authorizing Provider  aspirin (ECOTRIN LOW STRENGTH) 81 MG EC tablet Take 81 mg by mouth daily. Swallow whole.    [provider]  cholecalciferol (VITAMIN D) 1000 units tablet Take 1,000 Units by mouth daily.    [provider]  clopidogrel (PLAVIX) 75 MG tablet Take 75 mg by mouth daily.    [provider]  fluticasone (FLONASE) 50 MCG/ACT nasal spray Place 2 sprays into both nostrils daily as needed for allergies or rhinitis. 10/06/16   Hongalgi, Lenis Dickinson, MD  folic acid (FOLVITE) 1 MG tablet Take 1 mg by mouth daily.    [provider]  ketoconazole (NIZORAL) 2 % shampoo  12/09/16   [provider]  latanoprost (XALATAN) 0.005 % ophthalmic solution Place 1 drop into both eyes at bedtime.     [provider]  Propylene Glycol (SYSTANE BALANCE) 0.6 % SOLN Apply 2 drops to eye 2 (two) times daily. Left eye only    [provider]    Physical Exam: Vitals:   10/02/17 1059 10/02/17 1106 10/02/17 1249  BP: 125/79    Pulse: 69    Resp: 20    Temp: 97.9 F (36.6 C)  97.6 F (36.4 C)  TempSrc: Oral    SpO2: 99%    Weight:  47.2 kg (104 lb)   Height:  5\' 3"  (1.6 m)       Constitutional: NAD, calm, uncomfortable 2/2 mild right-sided throat discomfort with swallowing Eyes: PERRL, lids and conjunctivae normal ENMT: Mucous membranes are moist. Posterior pharynx clear of any exudate or lesions.Normal dentition.  Neck: normal, supple,  no thyromegaly, soft nontender mobile 1 cm fullness/mass consistent with likely lymph node right submandibular area; larger soft fixed nontender masslike area measuring 3 cm diameter estimated left neck superior aspect of thyroid. Respiratory: clear to auscultation bilaterally, no wheezing, no crackles. Normal respiratory effort. No accessory muscle use.  Cardiovascular: Regular rate and  rhythm, no murmurs / rubs / gallops. No extremity edema. 2+ pedal pulses. No carotid bruits.  Abdomen: no tenderness, no masses palpated. No hepatosplenomegaly. Bowel sounds positive.  Musculoskeletal: no clubbing / cyanosis. No joint deformity upper and lower extremities. Good ROM, no contractures. Normal muscle tone.  Skin: no rashes, lesions, ulcers. No induration Neurologic: CN 2-12 grossly intact. Sensation intact, DTR normal. Strength 5/5 x all 4 extremities.  Gait and ambulation were not assessed. Psychiatric: Normal judgment and insight. Alert and oriented x 3. Normal mood.    Labs on Admission: I have personally reviewed following labs and imaging studies  CBC: Recent Labs  Lab 10/02/17 1121  WBC 5.8  HGB 11.8*  HCT 36.3  MCV 94.8  PLT 503   Basic Metabolic Panel: Recent Labs  Lab 10/02/17 1121  NA 136  K 3.6  CL 104  CO2 24  GLUCOSE 94  BUN 12  CREATININE 0.81  CALCIUM 8.8*   GFR: Estimated Creatinine Clearance: 38.5 mL/min (by C-G formula based on SCr of 0.81 mg/dL). Liver Function Tests: No results for input(s): AST, ALT, ALKPHOS, BILITOT, PROT, ALBUMIN in the last 168 hours. No results for input(s): LIPASE, AMYLASE in the last 168 hours. No results for input(s): AMMONIA in the last 168 hours. Coagulation Profile: No results for input(s): INR, PROTIME in the last 168 hours. Cardiac Enzymes: No results for input(s): CKTOTAL, CKMB, CKMBINDEX, TROPONINI in the last 168 hours. BNP (last 3 results) No results for input(s): PROBNP in the last 8760 hours. HbA1C: No results for input(s): HGBA1C in the last 72 hours. CBG: No results for input(s): GLUCAP in the last 168 hours. Lipid Profile: No results for input(s): CHOL, HDL, LDLCALC, TRIG, CHOLHDL, LDLDIRECT in the last 72 hours. Thyroid Function Tests: No results for input(s): TSH, T4TOTAL, FREET4, T3FREE, THYROIDAB in the last 72 hours. Anemia Panel: No results for input(s): VITAMINB12, FOLATE, FERRITIN,  TIBC, IRON, RETICCTPCT in the last 72 hours. Urine analysis:    Component Value Date/Time   COLORURINE STRAW (A) 10/02/2017 1109   APPEARANCEUR CLEAR 10/02/2017 1109   LABSPEC 1.003 (L) 10/02/2017 1109   PHURINE 6.0 10/02/2017 1109   GLUCOSEU NEGATIVE 10/02/2017 1109   HGBUR  SMALL (A) 10/02/2017 1109   BILIRUBINUR NEGATIVE 10/02/2017 1109   KETONESUR NEGATIVE 10/02/2017 1109   PROTEINUR NEGATIVE 10/02/2017 1109   UROBILINOGEN 0.2 March 04, 202014 1048   NITRITE NEGATIVE 10/02/2017 1109   LEUKOCYTESUR NEGATIVE 10/02/2017 1109   Sepsis Labs: @LABRCNTIP (procalcitonin:4,lacticidven:4) )No results found for this or any previous visit (from the past 240 hour(s)).   Radiological Exams on Admission: Ct Head Wo Contrast  Result Date: 10/02/2017 CLINICAL DATA:  Severe headache for 3 days. Ataxia. Hypertension. Stroke suspected. EXAM: CT HEAD WITHOUT CONTRAST TECHNIQUE: Contiguous axial images were obtained from the base of the skull through the vertex without intravenous contrast. COMPARISON:  Brain MRI on 10/02/2016 FINDINGS: Brain: No evidence of acute infarction, hemorrhage, or hydrocephalus no abnormal extra-axial fluid collections. Mild to moderate diffuse cerebral atrophy and chronic small vessel disease shows no significant change. A 1.6 cm partially calcified extra-axial mass is seen along the left superior falx. This is unchanged and consistent with a small meningioma. Vascular:  No hyperdense vessel or other acute findings. Skull: No evidence of fracture or other significant bone abnormality. Sinuses/Orbits:  No acute findings. Other: None. IMPRESSION: No acute intracranial abnormality. Stable cerebral atrophy and chronic small vessel disease. Stable small left superior parafalcine meningioma. Electronically Signed   By: Earle Gell M.D.   On: 10/02/2017 13:05   Dg Chest Port 1 View  Result Date: 10/02/2017 CLINICAL DATA:  Dyspnea EXAM: PORTABLE CHEST 1 VIEW COMPARISON:  10/15/2016 chest  radiograph. FINDINGS: Stable cardiomediastinal silhouette with top-normal heart size. No pneumothorax. No pleural effusion. Lungs appear clear, with no acute consolidative airspace disease and no pulmonary edema. IMPRESSION: No active disease. Electronically Signed   By: Ilona Sorrel M.D.   On: 10/02/2017 12:18    EKG: (Independently reviewed) sinus bradycardia ventricular rate 59 bpm, QTC 394 ms, normal R wave rotation, no acute ischemic changes, lateral leads with ST changes consistent with abnormal repolarization  Assessment/Plan Principal Problem:   Ataxia in patient with History of TIA (transient ischemic attack) -Patient presents with 3 to 4 days of focal right-sided headache with 24 hours of right-sided odynophagia and witnessed intermittent gait ataxia concerning for possible TIA-no visual disturbances, facial asymmetry -Initial CT of the head unremarkable -Neuro hospitalist/[place] recommended to EDP obtain MRI and notify neuro hospitalist if positive for CVA -MRI/MRA brain stat -Carotid duplex and echocardiogram -FLP/HgbA1c -Frequent neurological checks -PT/OT/SLP evaluation-patient has passed bedside swallow evaluation -Continue preadmission baby aspirin and Plavix  Active Problems:   HTN (hypertension) -Current blood pressure controlled off medications    Palpable mass of neck-?? lymph node -Patient has 2 areas: One on right neck at submandibular area that seems consistent with benign lymph node, the other is in the left neck and what appears to be the superior aspect of the thyroid and may either be lipoma or thyroid mass -If not well imaged on MR consider thyroid ultrasound -Posterior pharynx examined and normal without erythema or drainage; right ear unremarkable except for mild cerumen -TSH    HLD (hyperlipidemia) -Not on statin or other pharmacotherapy prior to admission    **Additional lab, imaging and/or diagnostic evaluation at discretion of supervising  physician  DVT prophylaxis: Lovenox Code Status: Full Family Communication: Daughter Disposition Plan: Home Consults called: EDP spoke with neuro hospitalist but formal consultation delayed unless MR positive for CVA    ELLIS,ALLISON L. ANP-BC [facility] Pager [phone]   If 7PM-7AM, please contact night-coverage [email or web address] Password [email or web address]  10/02/2017, 3:08 PM

## 2017-10-02 NOTE — ED Provider Notes (Signed)
[facility] EMERGENCY DEPARTMENT Provider Note   CSN: [id number] Arrival date & time: 10/02/17  1049     History   Chief Complaint Chief Complaint  Patient presents with  . Headache  . Dysphagia  . Weakness    HPI Sydney [name] is a 84 y.o. female.  84 year old female with prior history of TIA, hypertension, palpitations, and high cholesterol presents with complaint of right-sided headache and unsteady gait.  Patient reports that her headache started about 3 days prior.  She reports feeling somewhat unsteady on her feet over the last 24 to 36 hours.  She reports a prior history of TIA which was diagnosed 10 years prior.  The TIA apparently affected her vision.  She did not have any long-term sequelae from that event. She denies any recent fever, nausea, vomiting, chest pain, shortness of breath, or other complaint.   The history is provided by the patient.  Illness  This is a new problem. The current episode started more than 2 days ago. The problem occurs constantly. The problem has not changed since onset.Pertinent negatives include no chest pain, no abdominal pain, no headaches and no shortness of breath. Nothing aggravates the symptoms. Nothing relieves the symptoms.    Past Medical History:  Diagnosis Date  . Anemia    years ago  . Arthritis    "left hand" (02/17/2013)  . C. difficile diarrhea   . Glaucoma   . High cholesterol    "on RX years ago" (02/17/2013)  . Hypertension   . Meningioma ([place]) 05/01/2015  . Osteoporosis 02/2014   T score -2.5  followed by Dr. Lysle Rubens  . Palpitations    PACs, PVCs and short runs of atrial tachycardia on Holter monitoring  . TIA (transient ischemic attack) 1990's    Patient Active Problem List   Diagnosis Date Noted  . Ataxia 10/02/2017  . History of TIA (transient ischemic attack) 10/02/2017  . HTN (hypertension) 10/02/2017  . HLD (hyperlipidemia) 10/02/2017  . Palpable mass of neck-?? lymph node 10/02/2017    . Protein-calorie malnutrition, severe 10/20/2016  . C. difficile diarrhea   . Hypertension 10/17/2016  . High cholesterol 10/17/2016  . Recurrent colitis due to Clostridium difficile 10/17/2016  . Dehydration with hyponatremia 10/17/2016  . Acute hypokalemia 10/17/2016  . Protein calorie malnutrition ([place]) 10/17/2016  . C. difficile colitis 10/05/2016  . Physical deconditioning 10/05/2016  . Recurrent Clostridium difficile diarrhea 10/05/2016  . Balance problem 10/05/2016  . History of transient ischemic attack (TIA) 10/05/2016  . Dehydration   . Hypokalemia 05/14/2016  . Anemia 05/14/2016  . Hyponatremia 05/14/2016  . Glaucoma 05/14/2016  . Meningioma ([place]) 05/01/2015  . 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 Diplopia 04/08/2015  . Leukopenia 04/08/2015  . Bradycardia 02/17/2013  . Occlusion and stenosis of carotid artery without mention of cerebral infarction 02/17/2013    Past Surgical History:  Procedure Laterality Date  . ANAL FISTULECTOMY  1970  . COLONOSCOPY    . COLONOSCOPY WITH PROPOFOL N/A 12/02/2016   Procedure: COLONOSCOPY WITH PROPOFOL;  Surgeon: Gatha Mayer, MD;  Location: [facility] ENDOSCOPY;  Service: Endoscopy;  Laterality: N/A;  . 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 TONSILLECTOMY       OB History    Gravida  1   Para  1   Term  1   Preterm  AB      Living  1     SAB      TAB      Ectopic      Multiple      Live Births               Home Medications    Prior to Admission medications   Medication Sig Start Date End Date Taking? Authorizing Provider  aspirin (ECOTRIN LOW STRENGTH) 81 MG EC tablet Take 81 mg by mouth daily. Swallow whole.   Yes [provider]  cholecalciferol (VITAMIN D) 1000 units tablet Take 1,000 Units by mouth daily.   Yes [provider]  clopidogrel (PLAVIX) 75 MG  tablet Take 75 mg by mouth daily.   Yes [provider]  fluticasone (FLONASE) 50 MCG/ACT nasal spray Place 2 sprays into both nostrils daily as needed for allergies or rhinitis. 10/06/16  Yes Hongalgi, Lenis Dickinson, MD  folic acid (FOLVITE) 1 MG tablet Take 1 mg by mouth daily.   Yes [provider]  ketoconazole (NIZORAL) 2 % shampoo Apply 1 application topically every 30 (thirty) days.  12/09/16  Yes [provider]  latanoprost (XALATAN) 0.005 % ophthalmic solution Place 1 drop into both eyes at bedtime.    Yes [provider]  Propylene Glycol (SYSTANE BALANCE) 0.6 % SOLN Apply 1 drop to eye 2 (two) times daily. Left eye only    Yes [provider]    Family History Family History  Problem Relation Age of Onset  . Hypertension Father   . Heart attack Father   . Stroke Brother   . Stroke Maternal Grandmother     Social History Social History   Tobacco Use  . Smoking status: Never Smoker  . Smokeless tobacco: Never Used  Substance Use Topics  . Alcohol use: No  . Drug use: No     Allergies   Demerol [meperidine]; Other; Codeine; Sulfa antibiotics; and Tomato   Review of Systems Review of Systems  Respiratory: Negative for shortness of breath.   Cardiovascular: Negative for chest pain.  Gastrointestinal: Negative for abdominal pain.  Neurological: Negative for headaches.  All other systems reviewed and are negative.    Physical Exam Updated Vital Signs BP (!) 144/66   Pulse (!) 57   Temp 97.6 F (36.4 C)   Resp 15   Ht 5\' 3"  (1.6 m)   Wt 47.2 kg (104 lb)   SpO2 100%   BMI 18.42 kg/m   Physical Exam  Constitutional: She is oriented to person, place, and time. She appears well-developed and well-nourished. No distress.  HENT:  Head: Normocephalic and atraumatic.  Mouth/Throat: Oropharynx is clear and moist.  Eyes: Pupils are equal, round, and reactive to light. Conjunctivae and EOM are normal.  Neck: Normal range of  motion. Neck supple.  Cardiovascular: Normal rate, regular rhythm and normal heart sounds.  Pulmonary/Chest: Effort normal and breath sounds normal. No respiratory distress.  Abdominal: Soft. She exhibits no distension. There is no tenderness.  Musculoskeletal: Normal range of motion. She exhibits no edema or deformity.  Neurological: She is alert and oriented to person, place, and time.  Skin: Skin is warm and dry.  Psychiatric: She has a normal mood and affect.  Nursing note and vitals reviewed.    ED Treatments / Results  Labs (all labs ordered are listed, but only abnormal results are displayed) Labs Reviewed  BASIC METABOLIC PANEL - Abnormal; Notable for the following components:  Result Value   Calcium 8.8 (*)    All other components within normal limits  CBC - Abnormal; Notable for the following components:   RBC 3.83 (*)    Hemoglobin 11.8 (*)    All other components within normal limits  URINALYSIS, ROUTINE W REFLEX MICROSCOPIC - Abnormal; Notable for the following components:   Color, Urine STRAW (*)    Specific Gravity, Urine 1.003 (*)    Hgb urine dipstick SMALL (*)    All other components within normal limits  TSH  CBG MONITORING, ED    EKG EKG Interpretation  Date/Time:  Saturday October 02 2017 11:11:34 EDT Ventricular Rate:  59 PR Interval:  120 QRS Duration: 76 QT Interval:  398 QTC Calculation: 394 R Axis:   57 Text Interpretation:  Sinus bradycardia Septal infarct , age undetermined Abnormal ECG Confirmed by Dene Gentry [phone]) on 10/02/2017 11:54:34 AM   Radiology Ct Head Wo Contrast  Result Date: 10/02/2017 CLINICAL DATA:  Severe headache for 3 days. Ataxia. Hypertension. Stroke suspected. EXAM: CT HEAD WITHOUT CONTRAST TECHNIQUE: Contiguous axial images were obtained from the base of the skull through the vertex without intravenous contrast. COMPARISON:  Brain MRI on 10/02/2016 FINDINGS: Brain: No evidence of acute infarction, hemorrhage, or  hydrocephalus no abnormal extra-axial fluid collections. Mild to moderate diffuse cerebral atrophy and chronic small vessel disease shows no significant change. A 1.6 cm partially calcified extra-axial mass is seen along the left superior falx. This is unchanged and consistent with a small meningioma. Vascular:  No hyperdense vessel or other acute findings. Skull: No evidence of fracture or other significant bone abnormality. Sinuses/Orbits:  No acute findings. Other: None. IMPRESSION: No acute intracranial abnormality. Stable cerebral atrophy and chronic small vessel disease. Stable small left superior parafalcine meningioma. Electronically Signed   By: Earle Gell M.D.   On: 10/02/2017 13:05   Dg Chest Port 1 View  Result Date: 10/02/2017 CLINICAL DATA:  Dyspnea EXAM: PORTABLE CHEST 1 VIEW COMPARISON:  10/15/2016 chest radiograph. FINDINGS: Stable cardiomediastinal silhouette with top-normal heart size. No pneumothorax. No pleural effusion. Lungs appear clear, with no acute consolidative airspace disease and no pulmonary edema. IMPRESSION: No active disease. Electronically Signed   By: Ilona Sorrel M.D.   On: 10/02/2017 12:18    Procedures Procedures (including critical care time)  Medications Ordered in ED Medications  aspirin EC tablet 81 mg (has no administration in time range)  cholecalciferol (VITAMIN D) tablet 1,000 Units (has no administration in time range)  clopidogrel (PLAVIX) tablet 75 mg (has no administration in time range)  fluticasone (FLONASE) 50 MCG/ACT nasal spray 2 spray (has no administration in time range)  folic acid (FOLVITE) tablet 1 mg (has no administration in time range)  latanoprost (XALATAN) 0.005 % ophthalmic solution 1 drop (has no administration in time range)  Propylene Glycol 0.6 % SOLN 2 drop (has no administration in time range)   stroke: mapping our early stages of recovery book (has no administration in time range)  acetaminophen (TYLENOL) tablet 650 mg (has  no administration in time range)    Or  acetaminophen (TYLENOL) solution 650 mg (has no administration in time range)    Or  acetaminophen (TYLENOL) suppository 650 mg (has no administration in time range)  enoxaparin (LOVENOX) injection 40 mg (has no administration in time range)     Initial Impression / Assessment and Plan / ED Course  I have reviewed the triage vital signs and the nursing notes.  Pertinent labs & imaging  results that were available during my care of the patient were reviewed by me and considered in my medical decision making (see chart for details).     MDM  Screen Complete   Patient is presenting for evaluation of unsteady gait and mild headache.  No other significant findings on exam are present.  Screening labs are without significant abnormality.  CT head does not demonstrate acute pathology.  Discussed with Dr. Cheral Marker of neurology.  He agrees with ED work-up and recommends obtaining MRI of the brain.  Patient will be admitted for further work-up to the hospitalist service.   Final Clinical Impressions(s) / ED Diagnoses   Final diagnoses:  Ataxia    ED Discharge Orders    None       Valarie Merino, MD 10/02/17 [phone]

## 2017-10-02 NOTE — ED Notes (Signed)
Pt ok to eat per Internal Medicine and ED Provider; Pt given [place] sandwich, Sprite, and water. Daughter still at bedside at this time.

## 2017-10-02 NOTE — ED Notes (Signed)
Patient denies pain and is resting comfortably.  

## 2017-10-02 NOTE — ED Triage Notes (Signed)
PT ambulated to BR with observation . Tol well.

## 2017-10-02 NOTE — ED Triage Notes (Signed)
Pt. Stated, and pt.  Ive had a headache for about 4 days on the right side, and its been difficult to swallow since yesterday. Pt. Sent here from [place], [place]. Daughter stated, shes been weak today like hard to stand and not pass out

## 2017-10-02 NOTE — ED Triage Notes (Signed)
Report called to [place] to MRI  Pt may transport to [place].

## 2017-10-02 NOTE — ED Triage Notes (Signed)
Pt ambulated to BR unassisted

## 2017-10-03 ENCOUNTER — Other Ambulatory Visit (HOSPITAL_COMMUNITY): Payer: 59

## 2017-10-03 ENCOUNTER — Observation Stay (HOSPITAL_BASED_OUTPATIENT_CLINIC_OR_DEPARTMENT_OTHER): Payer: Medicare Other

## 2017-10-03 ENCOUNTER — Observation Stay (HOSPITAL_COMMUNITY): Payer: Medicare Other

## 2017-10-03 DIAGNOSIS — I361 Nonrheumatic tricuspid (valve) insufficiency: Secondary | ICD-10-CM | POA: Diagnosis not present

## 2017-10-03 DIAGNOSIS — R270 Ataxia, unspecified: Secondary | ICD-10-CM

## 2017-10-03 DIAGNOSIS — R260 Ataxic gait: Secondary | ICD-10-CM | POA: Diagnosis not present

## 2017-10-03 LAB — ECHOCARDIOGRAM COMPLETE
Height: 63 in
Weight: 1664 oz

## 2017-10-03 LAB — LIPID PANEL
CHOLESTEROL: 136 mg/dL (ref 0–200)
HDL: 49 mg/dL (ref >40)
LDL Cholesterol: 79 mg/dL (ref 0–99)
TRIGLYCERIDES: 38 mg/dL (ref <150)
Total CHOL/HDL Ratio: 2.8 RATIO
VLDL: 8 mg/dL (ref 0–40)

## 2017-10-03 LAB — VITAMIN B12: Vitamin B-12: 215 pg/mL (ref 180–914)

## 2017-10-03 LAB — SEDIMENTATION RATE: SED RATE: 68 mm/h — AB (ref 0–22)

## 2017-10-03 LAB — TSH: TSH: 1.25 u[IU]/mL (ref 0.350–4.500)

## 2017-10-03 LAB — HEMOGLOBIN A1C
HEMOGLOBIN A1C: 5.8 % — AB (ref 4.8–5.6)
MEAN PLASMA GLUCOSE: 119.76 mg/dL

## 2017-10-03 LAB — C-REACTIVE PROTEIN: CRP: 4.4 mg/dL — ABNORMAL HIGH (ref <1.0)

## 2017-10-03 MED ORDER — IOHEXOL 300 MG/ML  SOLN
75.0000 mL | Freq: Once | INTRAMUSCULAR | Status: AC | PRN
  Administered 2017-10-03: 100 mL via INTRAVENOUS

## 2017-10-03 NOTE — Progress Notes (Signed)
VASCULAR LAB PRELIMINARY  PRELIMINARY  PRELIMINARY  PRELIMINARY  Carotid duple completed.    Preliminary report:  1-39% ICA plaquing. Vertebral artery flow is antegrade.   Nathyn Luiz, RVT 10/03/2017, 12:03 PM

## 2017-10-03 NOTE — Progress Notes (Signed)
'@IPLOG' @        PROGRESS NOTE                                                                                                                                                                                                             Patient Demographics:    Sydney [name], is a 84 y.o. female, DOB - Sep 18, 1930, ZDG:[id number]  Admit date - 10/02/2017   Admitting Physician Florencia Reasons, MD  Outpatient Primary MD for the patient is [name], MD  LOS - 0  Chief Complaint  Patient presents with  . Headache  . Dysphagia  . Weakness       Brief Narrative  Sydney [name] is a 84 y.o. female with medical history significant for remote history of TIA symptoms on baby aspirin and Plavix, hypertension, dyslipidemia, osteoporosis and arthritis.  Patient was in her usual state of health until about 3 to 4 days ago when she developed a severe right-sided headache she did not have any associated numbness or tingling either to the head or extremities.  She had no motor difficulties.  In the past 24 hours she has noticed painful swallowing primarily on the right side, she was admitted for stroke work-up.    Subjective:    Sydney [name] today has, No headache, No chest pain, No abdominal pain - No Nausea, No new weakness tingling or numbness, No Cough - SOB.    Assessment  & Plan :     1.  Headaches with questionable ataxia.  Patient denies any loss of balance or ataxic symptoms, she did have headache for the last few days which is completely resolved by itself.  No vision problems, no temporal tenderness, no jaw claudication or tongue discomfort.  ESR was borderline 64 and CRP was 4.4, MRI head unremarkable, CT brain unremarkable.  Case discussed with neurologist Dr. Cheral Marker on 10/03/2017.  Since headaches have completely resolved by themselves do not think this is temporal arteritis.  Will continue to monitor.  Carotid ultrasound unremarkable echo pending.  2.  Dysphagia.  Speech to evaluate, CT  soft tissue neck to be checked.  TSH stable.  Diet :  Diet Order           Diet Heart Room service appropriate? Yes; Fluid consistency: Thin  Diet effective now           Family Communication  : daughter  Code Status :  full  Disposition Plan  :  Home in am  Consults  :  none  Procedures  :  MRI - IMPRESSION: 1. Stable moderate atrophy and white matter disease. 2. No acute intracranial abnormality. 3. MRA circle-of-Willis is within normal limits without significant proximal stenosis, aneurysm, or branch vessel occlusion.  Carotid US - Preliminary report:  1-39% ICA plaquing. Vertebral artery flow is antegrade.    TTE  Neck CT  DVT Prophylaxis  :  Lovenox   Lab Results  Component Value Date   PLT 202 10/02/2017    Inpatient Medications  Scheduled Meds: .  stroke: mapping our early stages of recovery book   Does not apply Once  . aspirin EC  81 mg Oral Daily  . cholecalciferol  1,000 Units Oral Daily  . clopidogrel  75 mg Oral Daily  . enoxaparin (LOVENOX) injection  40 mg Subcutaneous Q24H  . fluticasone  1 spray Each Nare Daily  . folic acid  1 mg Oral Daily  . latanoprost  1 drop Both Eyes QHS  . loratadine  10 mg Oral Daily  . polyvinyl alcohol  2 drop Both Eyes BID   Continuous Infusions: PRN Meds:.acetaminophen **OR** acetaminophen (TYLENOL) oral liquid 160 mg/5 mL **OR** acetaminophen, fluticasone  Antibiotics  :    Anti-infectives (From admission, onward)   None         Objective:   Vitals:   10/03/17 0015 10/03/17 0207 10/03/17 0207 10/03/17 0531  BP: (!) 151/81 (!) 152/76 (!) 152/76 (!) 147/76  Pulse: 63 77 77 71  Resp:  '18 18 16  ' Temp: 98.4 F (36.9 C) 98.4 F (36.9 C) 98.4 F (36.9 C) 98.6 F (37 C)  TempSrc: Oral Oral Oral Oral  SpO2: 100% 100% 100% 100%  Weight:      Height:        Wt Readings from Last 3 Encounters:  10/02/17 47.2 kg (104 lb)  04/07/17 48.1 kg (106 lb)  01/25/17 46.3 kg (102 lb)    No intake or output  data in the 24 hours ending 10/03/17 1326   Physical Exam  Awake Alert, Oriented X 3, No new F.N deficits, Normal affect [place].AT,PERRAL Supple Neck,No JVD, No cervical lymphadenopathy appriciated.  Symmetrical Chest wall movement, Good air movement bilaterally, CTAB RRR,No Gallops,Rubs or new Murmurs, No Parasternal Heave +ve B.Sounds, Abd Soft, No tenderness, No organomegaly appriciated, No rebound - guarding or rigidity. No Cyanosis, Clubbing or edema, No new Rash or bruise      Data Review:    CBC Recent Labs  Lab 10/02/17 1121  WBC 5.8  HGB 11.8*  HCT 36.3  PLT 202  MCV 94.8  MCH 30.8  MCHC 32.5  RDW 13.1    Chemistries  Recent Labs  Lab 10/02/17 1121  NA 136  K 3.6  CL 104  CO2 24  GLUCOSE 94  BUN 12  CREATININE 0.81  CALCIUM 8.8*   ------------------------------------------------------------------------------------------------------------------ Recent Labs    10/03/17 0448  CHOL 136  HDL 49  LDLCALC 79  TRIG 38  CHOLHDL 2.8    Lab Results  Component Value Date   HGBA1C 5.8 (H) 10/03/2017   ------------------------------------------------------------------------------------------------------------------ Recent Labs    10/03/17 0448  TSH 1.250   ------------------------------------------------------------------------------------------------------------------ Recent Labs    10/03/17 0827  VITAMINB12 215    Coagulation profile No results for input(s): INR, PROTIME in the last 168 hours.  No results for input(s): DDIMER in the last 72 hours.  Cardiac Enzymes No results for input(s): CKMB, TROPONINI, MYOGLOBIN in the last 168 hours.  Invalid input(s): CK ------------------------------------------------------------------------------------------------------------------ No results found for: BNP  Micro Results No results found for this or any previous visit (from the past 240 hour(s)).  Radiology Reports Ct Head Wo Contrast  Result  Date: 10/02/2017 CLINICAL DATA:  Severe headache for 3 days. Ataxia. Hypertension. Stroke suspected. EXAM: CT HEAD WITHOUT CONTRAST TECHNIQUE: Contiguous axial images were obtained from the base of the skull through the vertex without intravenous contrast. COMPARISON:  Brain MRI on 10/02/2016 FINDINGS: Brain: No evidence of acute infarction, hemorrhage, or hydrocephalus no abnormal extra-axial fluid collections. Mild to moderate diffuse cerebral atrophy and chronic small vessel disease shows no significant change. A 1.6 cm partially calcified extra-axial mass is seen along the left superior falx. This is unchanged and consistent with a small meningioma. Vascular:  No hyperdense vessel or other acute findings. Skull: No evidence of fracture or other significant bone abnormality. Sinuses/Orbits:  No acute findings. Other: None. IMPRESSION: No acute intracranial abnormality. Stable cerebral atrophy and chronic small vessel disease. Stable small left superior parafalcine meningioma. Electronically Signed   By: Earle Gell M.D.   On: 10/02/2017 13:05   Mr Jodene Nam Head Wo Contrast  Result Date: 10/02/2017 CLINICAL DATA:  TIA. Severe right-sided headache beginning 3-4 days ago. Ataxic gait. Sore throat. EXAM: MRI HEAD WITHOUT CONTRAST MRA HEAD WITHOUT CONTRAST TECHNIQUE: Multiplanar, multiecho pulse sequences of the brain and surrounding structures were obtained without intravenous contrast. Angiographic images of the head were obtained using MRA technique without contrast. COMPARISON:  CT head without contrast 10/02/2017. MRI brain 10/02/2016 FINDINGS: MRI HEAD FINDINGS Brain: No acute infarct, hemorrhage, or mass lesion is present. Moderate atrophy and white matter disease is stable. A 17.5 cm left parafalcine meningioma is stable. No other focal lesions are present. No significant extra-axial fluid collection is present. Ventricles are proportionate to the degree of atrophy. The brainstem is normal. Posterior left  cerebellar lacunar infarct is noted. Vascular: Flow is present in the major intracranial arteries. Skull and upper cervical spine: The skull base is within normal limits. The craniocervical junction is normal. Sinuses/Orbits: The paranasal sinuses and mastoid air cells are clear. Globes and orbits are within normal limits. MRA HEAD FINDINGS The internal carotid arteries are within normal limits from the high cervical segments through the ICA termini bilaterally. The A1 and M1 segments are normal. The anterior communicating artery is patent ACA and MCA branch vessels are normal. The vertebral arteries are codominant. The PICA origins are visualized and within normal limits. A prominent left posterior communicating artery joins the left P1 segment to form the posterior cerebral artery. The right posterior cerebral artery originates from the basilar tip. PCA branch vessels are within normal limits. IMPRESSION: 1. Stable moderate atrophy and white matter disease. 2. No acute intracranial abnormality. 3. MRA circle-of-Willis is within normal limits without significant proximal stenosis, aneurysm, or branch vessel occlusion. Electronically Signed   By: San Morelle M.D.   On: 10/02/2017 17:32   Mr Brain Wo Contrast  Result Date: 10/02/2017 CLINICAL DATA:  TIA. Severe right-sided headache beginning 3-4 days ago. Ataxic gait. Sore throat. EXAM: MRI HEAD WITHOUT CONTRAST MRA HEAD WITHOUT CONTRAST TECHNIQUE: Multiplanar, multiecho pulse sequences of the brain and surrounding structures were obtained without intravenous contrast. Angiographic images of the head were obtained using MRA technique without contrast. COMPARISON:  CT head without contrast 10/02/2017. MRI brain 10/02/2016 FINDINGS: MRI HEAD FINDINGS Brain: No acute infarct, hemorrhage, or mass lesion is present. Moderate atrophy and white matter disease is stable. A 17.5 cm left parafalcine meningioma is stable. No other focal lesions are  present. No  significant extra-axial fluid collection is present. Ventricles are proportionate to the degree of atrophy. The brainstem is normal. Posterior left cerebellar lacunar infarct is noted. Vascular: Flow is present in the major intracranial arteries. Skull and upper cervical spine: The skull base is within normal limits. The craniocervical junction is normal. Sinuses/Orbits: The paranasal sinuses and mastoid air cells are clear. Globes and orbits are within normal limits. MRA HEAD FINDINGS The internal carotid arteries are within normal limits from the high cervical segments through the ICA termini bilaterally. The A1 and M1 segments are normal. The anterior communicating artery is patent ACA and MCA branch vessels are normal. The vertebral arteries are codominant. The PICA origins are visualized and within normal limits. A prominent left posterior communicating artery joins the left P1 segment to form the posterior cerebral artery. The right posterior cerebral artery originates from the basilar tip. PCA branch vessels are within normal limits. IMPRESSION: 1. Stable moderate atrophy and white matter disease. 2. No acute intracranial abnormality. 3. MRA circle-of-Willis is within normal limits without significant proximal stenosis, aneurysm, or branch vessel occlusion. Electronically Signed   By: San Morelle M.D.   On: 10/02/2017 17:32   Dg Chest Port 1 View  Result Date: 10/02/2017 CLINICAL DATA:  Dyspnea EXAM: PORTABLE CHEST 1 VIEW COMPARISON:  10/15/2016 chest radiograph. FINDINGS: Stable cardiomediastinal silhouette with top-normal heart size. No pneumothorax. No pleural effusion. Lungs appear clear, with no acute consolidative airspace disease and no pulmonary edema. IMPRESSION: No active disease. Electronically Signed   By: Ilona Sorrel M.D.   On: 10/02/2017 12:18    Time Spent in minutes  30   Lala Lund M.D on 10/03/2017 at 1:26 PM  Between 7am to 7pm - Pager - [phone] ( page via  [email or web address], text pages only, please mention full 10 digit call back number). After 7pm go to [email or web address] - password [email or web address]

## 2017-10-03 NOTE — Evaluation (Signed)
Occupational Therapy Evaluation/Discharge Patient Details Name: Sydney [name] MRN: [id number] DOB: 04-14-1931 Today's Date: 10/03/2017    History of Present Illness 84 y.o. female with prior history of TIA, hypertension, palpitations, and high cholesterol presents with complaint of right-sided headache and unsteady gait   Clinical Impression   Patient has been evaluated by Occupational Therapy with no acute OT needs identified. Pt appears to be at her baseline cognitively and near baseline physically. She completed ADL and functional mobility tasks with modified independence and supervision for ambulation due to mild gait abnormalities that can be further assessed by PT. Pt plans to discharge home with assistance and supervision from her daughter that she lives with. OT does not recommend any DME or other services at discharge. OT signing off at this time.    Follow Up Recommendations  No OT follow up;Supervision - Intermittent    Equipment Recommendations  None recommended by OT    Recommendations for Other Services       Precautions / Restrictions Precautions Precautions: None Restrictions Weight Bearing Restrictions: No      Mobility Bed Mobility               General bed mobility comments: Pt up in chair on OT arrival  Transfers Overall transfer level: Needs assistance Equipment used: None             General transfer comment: Supervision for safety d/t shuffling steps durnig ambulation.    Balance Overall balance assessment: Needs assistance Sitting-balance support: No upper extremity supported;Feet supported Sitting balance-Leahy Scale: Normal     Standing balance support: No upper extremity supported;During functional activity Standing balance-Leahy Scale: Good                             ADL either performed or assessed with clinical judgement   ADL Overall ADL's : Needs assistance/impaired Eating/Feeding: Independent   Grooming:  Wash/dry hands;Wash/dry face;Oral care;Brushing hair;Independent   Upper Body Bathing: Supervision/ safety   Lower Body Bathing: Supervison/ safety   Upper Body Dressing : Independent   Lower Body Dressing: Independent   Toilet Transfer: Supervision/safety   Toileting- Clothing Manipulation and Hygiene: Independent   Tub/ Shower Transfer: Tub transfer;Supervision/safety   Functional mobility during ADLs: Supervision/safety General ADL Comments: Pt's daughter present for OT eval. Reports pt is at her ADL baseline after observing during session.     Vision Baseline Vision/History: Wears glasses Wears Glasses: Reading only Patient Visual Report: No change from baseline Vision Assessment?: No apparent visual deficits     Perception     Praxis      Pertinent Vitals/Pain Pain Assessment: No/denies pain     Hand Dominance Right   Extremity/Trunk Assessment Upper Extremity Assessment Upper Extremity Assessment: Overall WFL for tasks assessed   Lower Extremity Assessment Lower Extremity Assessment: Overall WFL for tasks assessed   Cervical / Trunk Assessment Cervical / Trunk Assessment: Normal   Communication Communication Communication: No difficulties   Cognition Arousal/Alertness: Awake/alert Behavior During Therapy: WFL for tasks assessed/performed Overall Cognitive Status: Within Functional Limits for tasks assessed                                     General Comments       Exercises     Shoulder Instructions      Home Living Family/patient expects to be discharged to:: Private  residence Living Arrangements: Children(daughter) Available Help at Discharge: Family;Available PRN/intermittently Type of Home: House Home Access: Stairs to enter [employer] of Steps: 5 Entrance Stairs-Rails: Right;Left Home Layout: One level     Bathroom Shower/Tub: Teacher, early years/pre: Standard     Home Equipment: None           Prior Functioning/Environment Level of Independence: Independent        Comments: Takes baths. Loves to cook and very active in church/volunteering        OT Problem List: Impaired balance (sitting and/or standing);Decreased safety awareness      OT Treatment/Interventions:      OT Goals(Current goals can be found in the care plan section) Acute Rehab OT Goals Patient Stated Goal: To go home OT Goal Formulation: With patient Time For Goal Achievement: 10/17/17 Potential to Achieve Goals: Good  OT Frequency:     Barriers to D/C:            Co-evaluation              AM-PAC PT "6 Clicks" Daily Activity     Outcome Measure Help from another person eating meals?: None Help from another person taking care of personal grooming?: None Help from another person toileting, which includes using toliet, bedpan, or urinal?: None Help from another person bathing (including washing, rinsing, drying)?: A Little Help from another person to put on and taking off regular upper body clothing?: None Help from another person to put on and taking off regular lower body clothing?: None 6 Click Score: 23   End of Session Equipment Utilized During Treatment: Gait belt Nurse Communication: Mobility status  Activity Tolerance: Patient tolerated treatment well Patient left: in chair;with call bell/phone within reach;with family/visitor present  OT Visit Diagnosis: Unsteadiness on feet (R26.81)                Time: 1157-2620 OT Time Calculation (min): 16 min Charges:  OT General Charges $OT Visit: 1 Visit OT Evaluation $OT Eval Low Complexity: 1 Low G-Codes:     Redmond Baseman, MS, OTR/L 10/03/2017, 12:04 PM

## 2017-10-03 NOTE — Progress Notes (Signed)
  Echocardiogram 2D Echocardiogram has been performed.  Darlina Sicilian M 10/03/2017, 12:31 PM

## 2017-10-03 NOTE — Evaluation (Signed)
Physical Therapy Evaluation Patient Details Name: Sydney [name] MRN: [id number] DOB: 12-16-1930 Today's Date: 10/03/2017   History of Present Illness  84 y.o. female with prior history of TIA, hypertension, palpitations, and high cholesterol presents with complaint of right-sided headache and unsteady gait  Clinical Impression  Orders received for PT evaluation. Patient demonstrates modest deficits in functional mobility as indicated below but reports this is her mobility baseline. No overt LOB. Anticipate patient will be safe for return home, no further acute PT needs. Will sign off.    Follow Up Recommendations No PT follow up;Supervision - Intermittent    Equipment Recommendations  None recommended by PT    Recommendations for Other Services       Precautions / Restrictions Precautions Precautions: None Restrictions Weight Bearing Restrictions: No      Mobility  Bed Mobility               General bed mobility comments: Pt up in chair on OT arrival  Transfers Overall transfer level: Needs assistance Equipment used: None             General transfer comment: Supervision for safety d/t shuffling steps durnig ambulation.  Ambulation/Gait Ambulation/Gait assistance: Supervision Ambulation Distance (Feet): 240 Feet Assistive device: None Gait Pattern/deviations: Drifts right/left;Narrow base of support;Decreased stride length Gait velocity: decreased Gait velocity interpretation: <1.8 ft/sec, indicate of risk for recurrent falls General Gait Details: patient with some instability, but no overt LOB. reports this is her mobility baseline.  Stairs            Wheelchair Mobility    Modified Rankin (Stroke Patients Only)       Balance Overall balance assessment: Needs assistance Sitting-balance support: No upper extremity supported;Feet supported Sitting balance-Leahy Scale: Normal     Standing balance support: No upper extremity supported;During  functional activity Standing balance-Leahy Scale: Good               High level balance activites: Side stepping;Backward walking;Direction changes;Turns;Sudden stops;Head turns High Level Balance Comments: no physical signs of LOB noted, modest instability during cadence change and pivot             Pertinent Vitals/Pain      Home Living Family/patient expects to be discharged to:: Private residence Living Arrangements: Children(daughter) Available Help at Discharge: Family;Available PRN/intermittently Type of Home: House Home Access: Stairs to enter Entrance Stairs-Rails: Psychiatric nurse of Steps: 5 Home Layout: One level Home Equipment: None      Prior Function Level of Independence: Independent         Comments: Takes baths. 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   Dominant Hand: Right    Extremity/Trunk Assessment   Upper Extremity Assessment Upper Extremity Assessment: Generalized weakness    Lower Extremity Assessment Lower Extremity Assessment: Generalized weakness    Cervical / Trunk Assessment Cervical / Trunk Assessment: Normal  Communication   Communication: No difficulties  Cognition Arousal/Alertness: Awake/alert Behavior During Therapy: WFL for tasks assessed/performed Overall Cognitive Status: Within Functional Limits for tasks assessed                                        General Comments      Exercises     Assessment/Plan    PT Assessment Patent does not need any further PT services  PT Problem List  PT Treatment Interventions      PT Goals (Current goals can be found in the Care Plan section)  Acute Rehab PT Goals Patient Stated Goal: To go home PT Goal Formulation: All assessment and education complete, DC therapy    Frequency     Barriers to discharge        Co-evaluation               AM-PAC PT "6 Clicks" Daily Activity   Outcome Measure Difficulty turning over in bed (including adjusting bedclothes, sheets and blankets)?: None Difficulty moving from lying on back to sitting on the side of the bed? : None Difficulty sitting down on and standing up from a chair with arms (e.g., wheelchair, bedside commode, etc,.)?: A Little Help needed moving to and from a bed to chair (including a wheelchair)?: A Little Help needed walking in hospital room?: A Little Help needed climbing 3-5 steps with a railing? : A Little 6 Click Score: 20    End of Session Equipment Utilized During Treatment: Gait belt Activity Tolerance: Patient tolerated treatment well Patient left: in bed;with call bell/phone within reach(with transport ) Nurse Communication: Mobility status PT Visit Diagnosis: Unsteadiness on feet (R26.81)    Time: 0981-1914 PT Time Calculation (min) (ACUTE ONLY): 14 min   Charges:   PT Evaluation $PT Eval Low Complexity: 1 Low     PT G Codes:        Alben Deeds, PT DPT  Board Certified Neurologic Specialist [phone]   Duncan Dull 10/03/2017, 3:25 PM

## 2017-10-04 DIAGNOSIS — R260 Ataxic gait: Secondary | ICD-10-CM | POA: Diagnosis not present

## 2017-10-04 DIAGNOSIS — R270 Ataxia, unspecified: Secondary | ICD-10-CM | POA: Diagnosis not present

## 2017-10-04 LAB — CBC
HCT: 34.7 % — ABNORMAL LOW (ref 36.0–46.0)
Hemoglobin: 11.3 g/dL — ABNORMAL LOW (ref 12.0–15.0)
MCH: 30.8 pg (ref 26.0–34.0)
MCHC: 32.6 g/dL (ref 30.0–36.0)
MCV: 94.6 fL (ref 78.0–100.0)
PLATELETS: 200 10*3/uL (ref 150–400)
RBC: 3.67 MIL/uL — ABNORMAL LOW (ref 3.87–5.11)
RDW: 12.6 % (ref 11.5–15.5)
WBC: 4.3 10*3/uL (ref 4.0–10.5)

## 2017-10-04 LAB — BASIC METABOLIC PANEL
ANION GAP: 6 (ref 5–15)
BUN: 9 mg/dL (ref 6–20)
CO2: 27 mmol/L (ref 22–32)
CREATININE: 0.72 mg/dL (ref 0.44–1.00)
Calcium: 8.6 mg/dL — ABNORMAL LOW (ref 8.9–10.3)
Chloride: 104 mmol/L (ref 101–111)
Glucose, Bld: 88 mg/dL (ref 65–99)
Potassium: 3.6 mmol/L (ref 3.5–5.1)
Sodium: 137 mmol/L (ref 135–145)

## 2017-10-04 LAB — STREP PNEUMONIAE URINARY ANTIGEN: Strep Pneumo Urinary Antigen: NEGATIVE

## 2017-10-04 LAB — FOLATE RBC
FOLATE, HEMOLYSATE: 492.9 ng/mL
FOLATE, RBC: 1254 ng/mL (ref >498)
HEMATOCRIT: 39.3 % (ref 34.0–46.6)

## 2017-10-04 LAB — GROUP A STREP BY PCR: GROUP A STREP BY PCR: NOT DETECTED

## 2017-10-04 MED ORDER — IBUPROFEN 600 MG PO TABS
600.0000 mg | ORAL_TABLET | Freq: Once | ORAL | Status: AC
  Administered 2017-10-04: 600 mg via ORAL
  Filled 2017-10-04: qty 1

## 2017-10-04 MED ORDER — AMOXICILLIN-POT CLAVULANATE 875-125 MG PO TABS
1.0000 | ORAL_TABLET | Freq: Two times a day (BID) | ORAL | Status: DC
  Filled 2017-10-04: qty 1

## 2017-10-04 MED ORDER — LORATADINE 10 MG PO TABS: 10.0000 mg | ORAL_TABLET | Freq: Every day | ORAL | 0 refills | Status: DC

## 2017-10-04 MED FILL — PLAVIX 75 MG TABLET: 75 | 30 days supply | Qty: 30 | Fill #2

## 2017-10-04 NOTE — Discharge Instructions (Signed)
Do lukewarm water gargles 3-4 times a day, gargle and spit.    Follow with Primary MD [name], MD in 7 days   Get CBC, CMP, 2 view Chest X ray checked  by Primary MD in 5-7 days    Activity: As tolerated with Full fall precautions use walker/cane & assistance as needed  Disposition Home    Diet:    Heart Healthy    For Heart failure patients - Check your Weight same time everyday, if you gain over 2 pounds, or you develop in leg swelling, experience more shortness of breath or chest pain, call your Primary MD immediately. Follow Cardiac Low Salt Diet and 1.5 lit/day fluid restriction.    On your next visit with your primary care physician please Get Medicines reviewed and adjusted.  Please request your Prim.MD to go over all Hospital Tests and Procedure/Radiological results at the follow up, please get all Hospital records sent to your Prim MD by signing hospital release before you go home.  If you experience worsening of your admission symptoms, develop shortness of breath, life threatening emergency, suicidal or homicidal thoughts you must seek medical attention immediately by calling 911 or calling your MD immediately  if symptoms less severe.  You Must read complete instructions/literature along with all the possible adverse reactions/side effects for all the Medicines you take and that have been prescribed to you. Take any new Medicines after you have completely understood and accpet all the possible adverse reactions/side effects.

## 2017-10-04 NOTE — Progress Notes (Signed)
Nsg Discharge Note  Admit Date:  10/02/2017 Discharge date: 10/04/2017   Gay Filler to be D/C'd Home per MD order.  AVS completed.  Copy for chart, and copy for patient signed, and dated. Patient/caregiver able to verbalize understanding.  Discharge Medication: Allergies as of 10/04/2017      Reactions   Demerol [meperidine] Other (See Comments)   Excessive sweating, cramping   Other    No Antibiotic without consultation with her primary physician.   Codeine Rash   Sulfa Antibiotics Itching, Other (See Comments)   Reaction unknown   Tomato Itching      Medication List    TAKE these medications   cholecalciferol 1000 units tablet Commonly known as:  VITAMIN D Take 1,000 Units by mouth daily.   clopidogrel 75 MG tablet Commonly known as:  PLAVIX Take 75 mg by mouth daily.   ECOTRIN LOW STRENGTH 81 MG EC tablet Generic drug:  aspirin Take 81 mg by mouth daily. Swallow whole.   fluticasone 50 MCG/ACT nasal spray Commonly known as:  FLONASE Place 2 sprays into both nostrils daily as needed for allergies or rhinitis.   folic acid 1 MG tablet Commonly known as:  FOLVITE Take 1 mg by mouth daily.   ketoconazole 2 % shampoo Commonly known as:  NIZORAL Apply 1 application topically every 30 (thirty) days.   latanoprost 0.005 % ophthalmic solution Commonly known as:  XALATAN Place 1 drop into both eyes at bedtime.   loratadine 10 MG tablet Commonly known as:  CLARITIN Take 1 tablet (10 mg total) by mouth daily. Start taking on:  10/05/2017   SYSTANE BALANCE 0.6 % Soln Generic drug:  Propylene Glycol Apply 1 drop to eye 2 (two) times daily. Left eye only       Discharge Assessment: Vitals:   10/04/17 0740 10/04/17 1235  BP: 116/70 114/63  Pulse: 67 64  Resp: 15 16  Temp: 98.3 F (36.8 C) 98.7 F (37.1 C)  SpO2: 100% 100%   Skin clean, dry and intact without evidence of skin break down, no evidence of skin tears noted. IV catheter discontinued intact.  Site without signs and symptoms of complications - no redness or edema noted at insertion site, patient denies c/o pain - only slight tenderness at site.  Dressing with slight pressure applied.  D/c Instructions-Education: Discharge instructions given to patient/family with verbalized understanding. D/c education completed with patient/family including follow up instructions, medication list, d/c activities limitations if indicated, with other d/c instructions as indicated by MD - patient able to verbalize understanding, all questions fully answered. Patient instructed to return to ED, call 911, or call MD for any changes in condition.  Patient escorted via [place], and D/C home via private auto.  [place] N Rifky Lapre, RN 10/04/2017 12:50 PM

## 2017-10-04 NOTE — Discharge Summary (Signed)
Sydney [name] PIR:[id number] DOB: 1930/10/28 DOA: 10/02/2017  PCP: [name], MD  Admit date: 10/02/2017  Discharge date: 10/04/2017  Admitted From: Home   Disposition:  Home   Recommendations for Outpatient Follow-up:   Follow up with PCP in 1-2 weeks  PCP Please obtain BMP/CBC, 2 view CXR in 1week,  (see Discharge instructions)   PCP Please follow up on the following pending results: None   Home Health: None   Equipment/Devices: None  Consultations: None Discharge Condition: Stable   CODE STATUS: Full   Diet Recommendation: Heart Healthy    Chief Complaint  Patient presents with  . Headache  . Dysphagia  . Weakness     Brief history of present illness from the day of admission and additional interim summary    KORI GOINS a 84 y.o.femalewith medical history significant forremote history of TIA symptoms on baby aspirin and Plavix, hypertension, dyslipidemia, osteoporosis and arthritis. Patient was in her usual state of health until about 3 to 4 days ago when she developed a severe right-sided headache she did not have any associated numbness or tingling either to the head or extremities. She had no motor difficulties. In the past 24 hours she has noticed painful swallowing primarily on the right side, she was admitted for stroke work-up.                                                                   Hospital Course    1.  Headaches with questionable ataxia.  Patient denies any loss of balance or ataxic symptoms, she did have headache for the last few days which is completely resolved by itself.  No vision problems, no temporal tenderness, no jaw claudication or tongue discomfort.  ESR was borderline 64 and CRP was 4.4, MRI head unremarkable, CT brain unremarkable.  Case discussed with  neurologist Dr. Cheral Marker on 10/03/2017.  Since headaches have completely resolved by themselves do not think this is temporal arteritis.  Will continue to monitor.  Carotid ultrasound unremarkable, unremarkable echocardiogram as well.  Headache is completely resolved by itself.  Request PCP to monitor in the future.  2.  Dysphagia with some odynophagia.  Stable TSH, CT soft tissue neck shows mild pharyngitis, strep throat screen negative, exam benign with no exudate in the pharynx no tender lymph nodes.  Likely viral or allergic pharyngitis.  Patient requested to do warm water gargles 3-4 times a day and take Claritin for 2 weeks.  Follow with PCP.      Discharge diagnosis     Principal Problem:   Ataxia Active Problems:   History of TIA (transient ischemic attack)   HTN (hypertension)   HLD (hyperlipidemia)   Palpable mass of neck-?? lymph node    Discharge instructions    Discharge Instructions  Diet - low sodium heart healthy   Complete by:  As directed    Discharge instructions   Complete by:  As directed    Do lukewarm water gargles 3-4 times a day, gargle and spit.    Follow with Primary MD [name], MD in 7 days   Get CBC, CMP, 2 view Chest X ray checked  by Primary MD in 5-7 days    Activity: As tolerated with Full fall precautions use walker/cane & assistance as needed  Disposition Home    Diet:    Heart Healthy    For Heart failure patients - Check your Weight same time everyday, if you gain over 2 pounds, or you develop in leg swelling, experience more shortness of breath or chest pain, call your Primary MD immediately. Follow Cardiac Low Salt Diet and 1.5 lit/day fluid restriction.    On your next visit with your primary care physician please Get Medicines reviewed and adjusted.  Please request your Prim.MD to go over all Hospital Tests and Procedure/Radiological results at the follow up, please get all Hospital records sent to your Prim MD by signing  hospital release before you go home.  If you experience worsening of your admission symptoms, develop shortness of breath, life threatening emergency, suicidal or homicidal thoughts you must seek medical attention immediately by calling 911 or calling your MD immediately  if symptoms less severe.  You Must read complete instructions/literature along with all the possible adverse reactions/side effects for all the Medicines you take and that have been prescribed to you. Take any new Medicines after you have completely understood and accpet all the possible adverse reactions/side effects.   Increase activity slowly   Complete by:  As directed       Discharge Medications   Allergies as of 10/04/2017      Reactions   Demerol [meperidine] Other (See Comments)   Excessive sweating, cramping   Other    No Antibiotic without consultation with her primary physician.   Codeine Rash   Sulfa Antibiotics Itching, Other (See Comments)   Reaction unknown   Tomato Itching      Medication List    TAKE these medications   cholecalciferol 1000 units tablet Commonly known as:  VITAMIN D Take 1,000 Units by mouth daily.   clopidogrel 75 MG tablet Commonly known as:  PLAVIX Take 75 mg by mouth daily.   ECOTRIN LOW STRENGTH 81 MG EC tablet Generic drug:  aspirin Take 81 mg by mouth daily. Swallow whole.   fluticasone 50 MCG/ACT nasal spray Commonly known as:  FLONASE Place 2 sprays into both nostrils daily as needed for allergies or rhinitis.   folic acid 1 MG tablet Commonly known as:  FOLVITE Take 1 mg by mouth daily.   ketoconazole 2 % shampoo Commonly known as:  NIZORAL Apply 1 application topically every 30 (thirty) days.   latanoprost 0.005 % ophthalmic solution Commonly known as:  XALATAN Place 1 drop into both eyes at bedtime.   loratadine 10 MG tablet Commonly known as:  CLARITIN Take 1 tablet (10 mg total) by mouth daily. Start taking on:  10/05/2017   SYSTANE BALANCE 0.6  % Soln Generic drug:  Propylene Glycol Apply 1 drop to eye 2 (two) times daily. Left eye only       Follow-up Information    [name], MD. Schedule an appointment as soon as possible for a visit in 1 week(s).   Specialty:  Internal Medicine Contact information: [phone]  [address] [place] [address] [phone]           Major procedures and Radiology Reports - PLEASE review detailed and final reports thoroughly  -      TTE   - Left ventricle: The cavity size was normal. There was mild focal   basal hypertrophy of the septum. Systolic function was normal.   The estimated ejection fraction was in the range of 60% to 65%.   Wall motion was normal; there were no regional wall motion   abnormalities. Doppler parameters are consistent with abnormal   left ventricular relaxation (grade 1 diastolic dysfunction).   There was no evidence of elevated ventricular filling pressure by   Doppler parameters. - Aortic valve: There was no regurgitation. - Aortic root: The aortic root was normal in size. - Mitral valve: Mildly thickened leaflets . There was trivial   regurgitation. - Left atrium: The atrium was normal in size. - Right ventricle: The cavity size was normal. Wall thickness was   normal. Systolic function was normal. - Tricuspid valve: There was mild regurgitation. - Pulmonary arteries: Systolic pressure was within the normal   range. - Inferior vena cava: The vessel was normal in size. - Pericardium, extracardiac: There was no pericardial effusion.  Impressions:  - No cardiac source of emboli was indentified.   Ct Head Wo Contrast  Result Date: 10/02/2017 CLINICAL DATA:  Severe headache for 3 days. Ataxia. Hypertension. Stroke suspected. EXAM: CT HEAD WITHOUT CONTRAST TECHNIQUE: Contiguous axial images were obtained from the base of the skull through the vertex without intravenous contrast. COMPARISON:  Brain MRI on 10/02/2016 FINDINGS: Brain:  No evidence of acute infarction, hemorrhage, or hydrocephalus no abnormal extra-axial fluid collections. Mild to moderate diffuse cerebral atrophy and chronic small vessel disease shows no significant change. A 1.6 cm partially calcified extra-axial mass is seen along the left superior falx. This is unchanged and consistent with a small meningioma. Vascular:  No hyperdense vessel or other acute findings. Skull: No evidence of fracture or other significant bone abnormality. Sinuses/Orbits:  No acute findings. Other: None. IMPRESSION: No acute intracranial abnormality. Stable cerebral atrophy and chronic small vessel disease. Stable small left superior parafalcine meningioma. Electronically Signed   By: Earle Gell M.D.   On: 10/02/2017 13:05   Ct Soft Tissue Neck W Contrast  Result Date: 10/03/2017 CLINICAL DATA:  Neck mass, multiple. Pain with swallowing on the right side of the neck last 24 hours. EXAM: CT NECK WITH CONTRAST TECHNIQUE: Multidetector CT imaging of the neck was performed using the standard protocol following the bolus administration of intravenous contrast. CONTRAST:  149m OMNIPAQUE IOHEXOL 300 MG/ML  SOLN COMPARISON:  MRI brain 10/02/2017. FINDINGS: Pharynx and larynx: Asymmetric mucosal enhancement is present in the right nasopharynx and oropharynx no discrete mass lesion is present. Lingual tonsils are within normal limits. There is mild prominence of the lingual tonsils diffusely. Epiglottis is within normal limits. The hypopharynx is clear. Vocal cords are midline and symmetric. The trachea is unremarkable. Salivary glands: Submandibular and parotid glands demonstrate diffuse enhancement. There is no focal mass lesion. The marker is placed over the right submandibular gland. Thyroid: Normal. Lymph nodes: Subcentimeter lymph nodes are within normal limits. No significant cervical adenopathy is present. Vascular: Atherosclerotic calcifications are present at the carotid bifurcations  bilaterally. There is tortuosity of the cervical internal carotid arteries. No significant stenosis is present. Limited intracranial: Within normal limits. Visualized orbits: Globes and orbits are within normal limits bilaterally.  Mastoids and visualized paranasal sinuses: The paranasal sinuses and mastoid air cells are clear. Skeleton: Vertebral body heights and alignment are maintained. No focal lytic or blastic lesions are present. Upper chest: The lung apices are clear. Thoracic inlet is within normal limits. IMPRESSION: 1. Extensive mucosal enhancement along the right naso and oropharynx without a discrete mass. This may be related to acute pharyngitis. Neoplasm cannot be entirely excluded. Recommend direct visualization. 2. Mild prominence of the lingual tonsils also favors an inflammatory process. 3. No significant adenopathy or metastatic disease. 4. Normal appearance of the salivary glands. The marker is placed at the right submandibular gland. There is no significant asymmetry of the right submandibular gland, evidence for duct dilation or focal mass lesion. Electronically Signed   By: San Morelle M.D.   On: 10/03/2017 16:21   Mr Jodene Nam Head Wo Contrast  Result Date: 10/02/2017 CLINICAL DATA:  TIA. Severe right-sided headache beginning 3-4 days ago. Ataxic gait. Sore throat. EXAM: MRI HEAD WITHOUT CONTRAST MRA HEAD WITHOUT CONTRAST TECHNIQUE: Multiplanar, multiecho pulse sequences of the brain and surrounding structures were obtained without intravenous contrast. Angiographic images of the head were obtained using MRA technique without contrast. COMPARISON:  CT head without contrast 10/02/2017. MRI brain 10/02/2016 FINDINGS: MRI HEAD FINDINGS Brain: No acute infarct, hemorrhage, or mass lesion is present. Moderate atrophy and white matter disease is stable. A 17.5 cm left parafalcine meningioma is stable. No other focal lesions are present. No significant extra-axial fluid collection is present.  Ventricles are proportionate to the degree of atrophy. The brainstem is normal. Posterior left cerebellar lacunar infarct is noted. Vascular: Flow is present in the major intracranial arteries. Skull and upper cervical spine: The skull base is within normal limits. The craniocervical junction is normal. Sinuses/Orbits: The paranasal sinuses and mastoid air cells are clear. Globes and orbits are within normal limits. MRA HEAD FINDINGS The internal carotid arteries are within normal limits from the high cervical segments through the ICA termini bilaterally. The A1 and M1 segments are normal. The anterior communicating artery is patent ACA and MCA branch vessels are normal. The vertebral arteries are codominant. The PICA origins are visualized and within normal limits. A prominent left posterior communicating artery joins the left P1 segment to form the posterior cerebral artery. The right posterior cerebral artery originates from the basilar tip. PCA branch vessels are within normal limits. IMPRESSION: 1. Stable moderate atrophy and white matter disease. 2. No acute intracranial abnormality. 3. MRA circle-of-Willis is within normal limits without significant proximal stenosis, aneurysm, or branch vessel occlusion. Electronically Signed   By: San Morelle M.D.   On: 10/02/2017 17:32   Mr Brain Wo Contrast  Result Date: 10/02/2017 CLINICAL DATA:  TIA. Severe right-sided headache beginning 3-4 days ago. Ataxic gait. Sore throat. EXAM: MRI HEAD WITHOUT CONTRAST MRA HEAD WITHOUT CONTRAST TECHNIQUE: Multiplanar, multiecho pulse sequences of the brain and surrounding structures were obtained without intravenous contrast. Angiographic images of the head were obtained using MRA technique without contrast. COMPARISON:  CT head without contrast 10/02/2017. MRI brain 10/02/2016 FINDINGS: MRI HEAD FINDINGS Brain: No acute infarct, hemorrhage, or mass lesion is present. Moderate atrophy and white matter disease is  stable. A 17.5 cm left parafalcine meningioma is stable. No other focal lesions are present. No significant extra-axial fluid collection is present. Ventricles are proportionate to the degree of atrophy. The brainstem is normal. Posterior left cerebellar lacunar infarct is noted. Vascular: Flow is present in the major intracranial arteries. Skull and upper cervical  spine: The skull base is within normal limits. The craniocervical junction is normal. Sinuses/Orbits: The paranasal sinuses and mastoid air cells are clear. Globes and orbits are within normal limits. MRA HEAD FINDINGS The internal carotid arteries are within normal limits from the high cervical segments through the ICA termini bilaterally. The A1 and M1 segments are normal. The anterior communicating artery is patent ACA and MCA branch vessels are normal. The vertebral arteries are codominant. The PICA origins are visualized and within normal limits. A prominent left posterior communicating artery joins the left P1 segment to form the posterior cerebral artery. The right posterior cerebral artery originates from the basilar tip. PCA branch vessels are within normal limits. IMPRESSION: 1. Stable moderate atrophy and white matter disease. 2. No acute intracranial abnormality. 3. MRA circle-of-Willis is within normal limits without significant proximal stenosis, aneurysm, or branch vessel occlusion. Electronically Signed   By: San Morelle M.D.   On: 10/02/2017 17:32   Dg Chest Port 1 View  Result Date: 10/02/2017 CLINICAL DATA:  Dyspnea EXAM: PORTABLE CHEST 1 VIEW COMPARISON:  10/15/2016 chest radiograph. FINDINGS: Stable cardiomediastinal silhouette with top-normal heart size. No pneumothorax. No pleural effusion. Lungs appear clear, with no acute consolidative airspace disease and no pulmonary edema. IMPRESSION: No active disease. Electronically Signed   By: Ilona Sorrel M.D.   On: 10/02/2017 12:18    Micro Results     Recent Results  (from the past 240 hour(s))  Group A Strep by PCR     Status: None   Collection Time: 10/04/17  8:04 AM  Result Value Ref Range Status   Group A Strep by PCR NOT DETECTED NOT DETECTED Final    Comment: Performed at [facility], [address] [place], [place] [address]    Today   Subjective    Loran Auguste today has no headache,no chest abdominal pain,no new weakness tingling or numbness, feels much better wants to go home today.     Objective   Blood pressure 116/70, pulse 67, temperature 98.3 F (36.8 C), temperature source Oral, resp. rate 15, height '5\' 3"'  (1.6 m), weight 47.2 kg (104 lb), SpO2 100 %.   Intake/Output Summary (Last 24 hours) at 10/04/2017 1047 Last data filed at 10/04/2017 1023 Gross per 24 hour  Intake 240 ml  Output 300 ml  Net -60 ml    Exam Awake Alert, Oriented x 3, No new F.N deficits, Normal affect [place].AT,PERRAL Supple Neck,No JVD, No cervical lymphadenopathy appriciated.  Symmetrical Chest wall movement, Good air movement bilaterally, CTAB RRR,No Gallops,Rubs or new Murmurs, No Parasternal Heave +ve B.Sounds, Abd Soft, Non tender, No organomegaly appriciated, No rebound -guarding or rigidity. No Cyanosis, Clubbing or edema, No new Rash or bruise   Data Review   CBC w Diff:  Lab Results  Component Value Date   WBC 4.3 10/04/2017   HGB 11.3 (L) 10/04/2017   HGB 10.7 (L) 05/01/2015   HCT 34.7 (L) 10/04/2017   HCT 32.9 (L) 05/01/2015   PLT 200 10/04/2017   PLT 321 05/01/2015   LYMPHOPCT 10 10/13/2016   MONOPCT 8 10/13/2016   EOSPCT 0 10/13/2016   BASOPCT 0 10/13/2016    CMP:  Lab Results  Component Value Date   NA 137 10/04/2017   K 3.6 10/04/2017   CL 104 10/04/2017   CO2 27 10/04/2017   BUN 9 10/04/2017   CREATININE 0.72 10/04/2017   CREATININE 0.67 01/03/2014   PROT 6.8 10/13/2016   ALBUMIN 3.3 (L)  10/13/2016   BILITOT 1.1 10/13/2016   ALKPHOS 76 10/13/2016   AST 35 10/13/2016   ALT 19 10/13/2016  .   Total  Time in preparing paper work, data evaluation and todays exam - 40 minutes  Lala Lund M.D on 10/04/2017 at 10:47 AM  [facility]   Office  [phone]

## 2017-11-09 MED FILL — PLAVIX 75 MG TABLET: 75 | 30 days supply | Qty: 30 | Fill #0

## 2017-12-06 ENCOUNTER — Other Ambulatory Visit: Payer: Self-pay | Admitting: Internal Medicine

## 2017-12-06 DIAGNOSIS — Z1231 Encounter for screening mammogram for malignant neoplasm of breast: Secondary | ICD-10-CM

## 2017-12-06 MED FILL — PLAVIX 75 MG TABLET: 75 | 30 days supply | Qty: 30 | Fill #1

## 2018-01-10 MED FILL — PLAVIX 75 MG TABLET: 75 | 30 days supply | Qty: 30 | Fill #2

## 2018-01-11 ENCOUNTER — Ambulatory Visit
Admission: RE | Admit: 2018-01-11 | Discharge: 2018-01-11 | Disposition: A | Discharge status: Home / Self Care | Payer: Medicare Other | Source: Ambulatory Visit | Attending: Internal Medicine | Admitting: Internal Medicine

## 2018-01-11 DIAGNOSIS — Z1231 Encounter for screening mammogram for malignant neoplasm of breast: Secondary | ICD-10-CM

## 2018-02-08 MED FILL — PLAVIX 75 MG TABLET: 75 | 30 days supply | Qty: 30 | Fill #0

## 2018-02-25 IMAGING — DX DG CHEST 2V
2 series · 2 of 2 positions shown · non-contrast
Comparison: 04/08/2015

CLINICAL DATA: Weakness for 1 week

EXAM:
CHEST  2 VIEW

[chest lat]
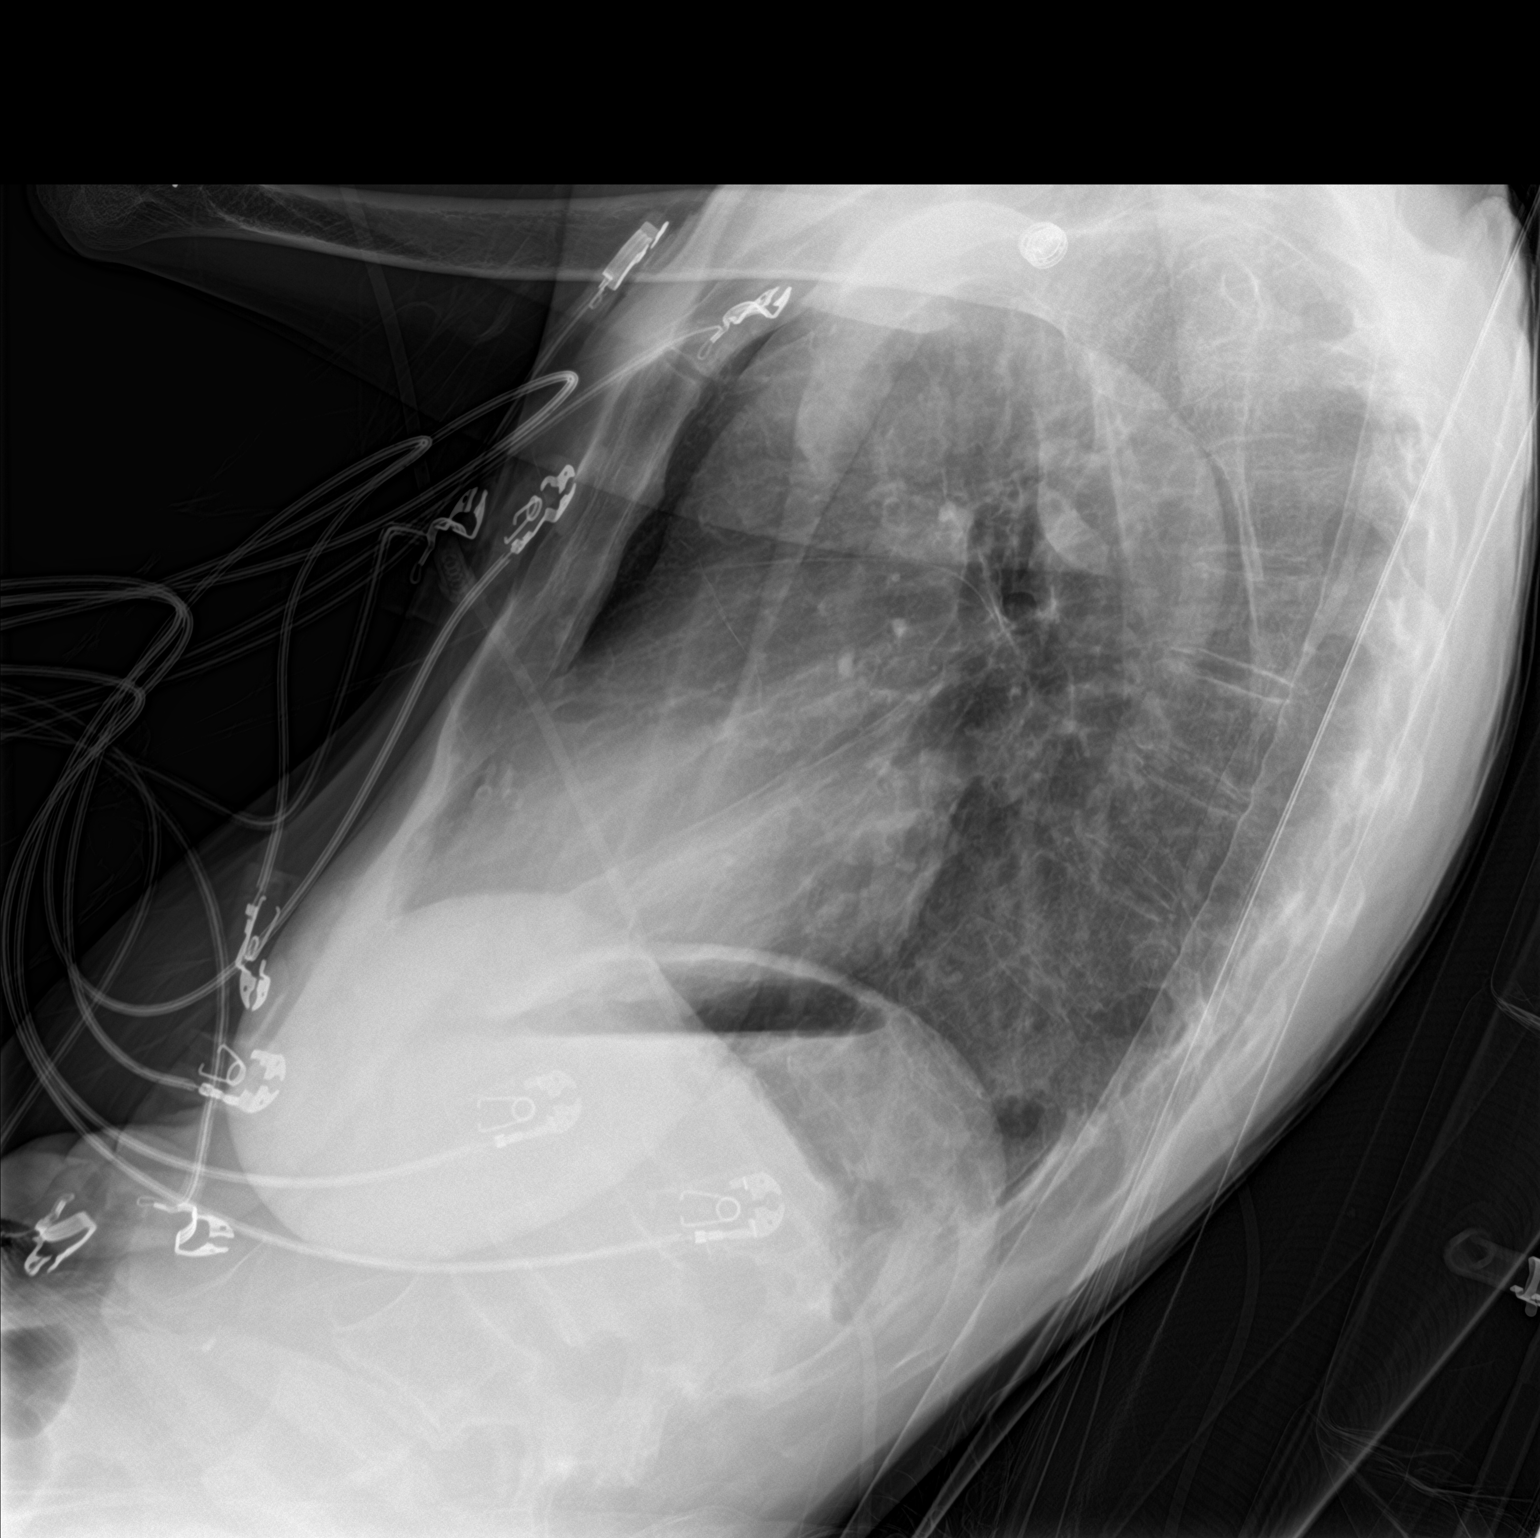

[chest ap]
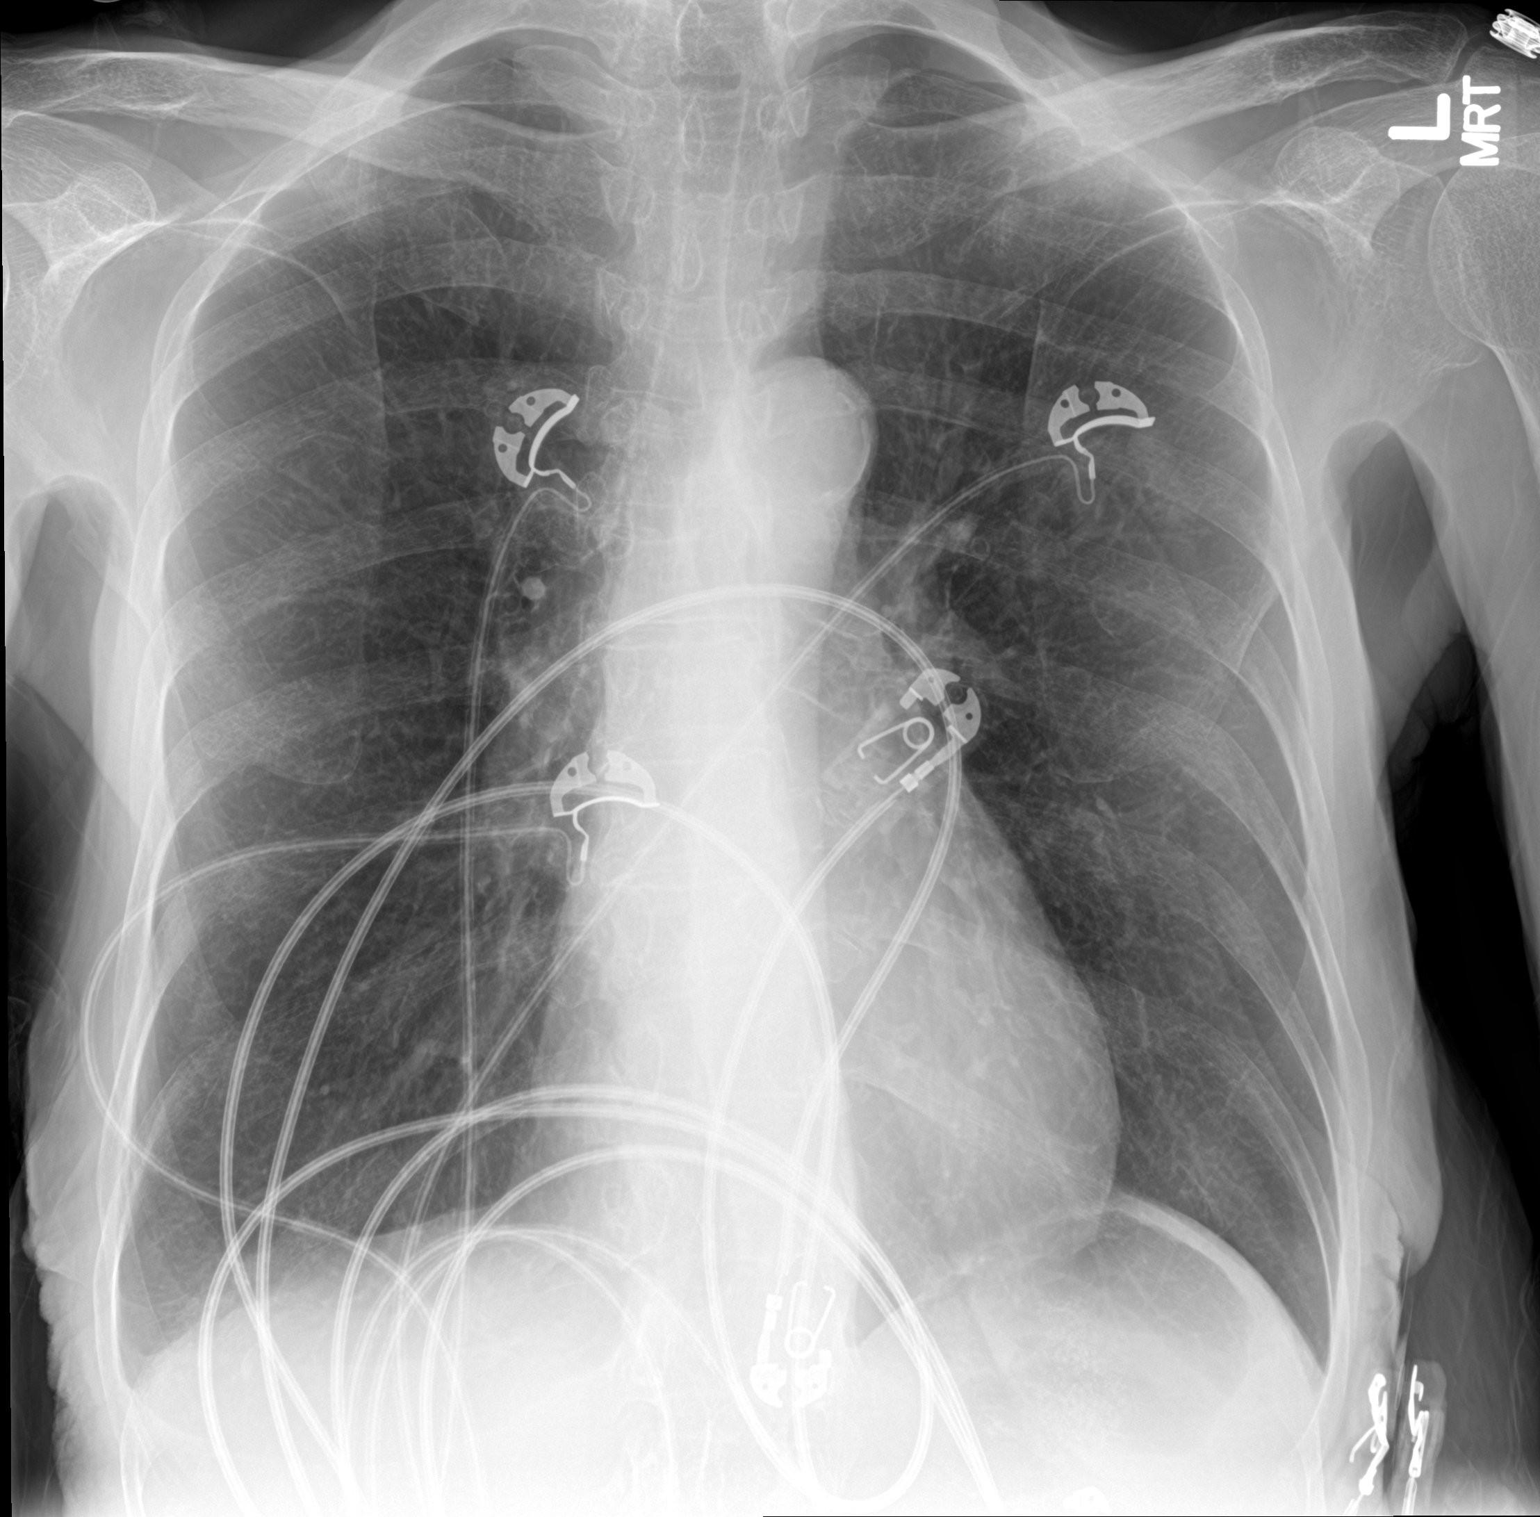

[2 of 2 positions shown; findings below may reference images not displayed]

FINDINGS: Cardiac shadow is within normal limits. Lungs are hyperinflated. No
focal infiltrate or sizable effusion is seen. No acute bony
abnormality is noted.
IMPRESSION: COPD without acute abnormality.

## 2018-03-11 MED FILL — PLAVIX 75 MG TABLET: 75 | 30 days supply | Qty: 30 | Fill #1

## 2018-04-08 MED FILL — PLAVIX 75 MG TABLET: 75 | 30 days supply | Qty: 30 | Fill #0

## 2018-04-12 ENCOUNTER — Encounter: Payer: Self-pay | Admitting: Gynecology

## 2018-04-12 ENCOUNTER — Ambulatory Visit (INDEPENDENT_AMBULATORY_CARE_PROVIDER_SITE_OTHER): Payer: Medicare Other | Admitting: Gynecology

## 2018-04-12 VITALS — BP 120/66 | Ht 62.0 in | Wt 102.0 lb

## 2018-04-12 DIAGNOSIS — Z01419 Encounter for gynecological examination (general) (routine) without abnormal findings: Secondary | ICD-10-CM | POA: Diagnosis not present

## 2018-04-12 DIAGNOSIS — M810 Age-related osteoporosis without current pathological fracture: Secondary | ICD-10-CM

## 2018-04-12 DIAGNOSIS — N952 Postmenopausal atrophic vaginitis: Secondary | ICD-10-CM

## 2018-04-12 NOTE — Patient Instructions (Signed)
Check with Dr. Lysle Rubens about your bone density and follow-up about your osteoporosis.

## 2018-04-12 NOTE — Progress Notes (Signed)
    CLARYSSA SANDNER May 27, 1930 [id number]        84 y.o.  G1P1001 for breast and pelvic exam.  Without gynecologic complaints  Past medical history,surgical history, problem list, medications, allergies, family history and social history were all reviewed and documented as reviewed in the EPIC chart.  ROS:  Performed with pertinent positives and negatives included in the history, assessment and plan.   Additional significant findings : None   Exam: [name] assistant Vitals:   04/12/18 0902  BP: 120/66  Weight: 102 lb (46.3 kg)  Height: 5\' 2"  (1.575 m)   Body mass index is 18.66 kg/m.  General appearance:  Normal affect, orientation and appearance. Skin: Grossly normal HEENT: Without gross lesions.  No cervical or supraclavicular adenopathy. Thyroid normal.  Lungs:  Clear without wheezing, rales or rhonchi Cardiac: RR, without RMG Abdominal:  Soft, nontender, without masses, guarding, rebound, organomegaly or hernia Breasts:  Examined lying and sitting without masses, retractions, discharge or axillary adenopathy. Pelvic:  Ext, BUS, Vagina: With atrophic changes  Cervix: With atrophic changes flush with upper vagina  Uterus: Difficult to palpate but no gross masses or tenderness  Adnexa: Without masses or tenderness    Anus and perineum: Normal   Rectovaginal: Normal sphincter tone without palpated masses or tenderness.    Assessment/Plan:  84 y.o. G59P1001 female for breast and pelvic exam.   1. Postmenopausal.  No significant menopausal symptoms or vaginal bleeding. 2. Osteoporosis.  Reports being followed by Dr. Lysle Rubens.  I do not have copies of any recent bone densities.  Of asked her to follow-up with him in reference to this.  She has an appointment to see him early January and is going to ask him about bone health and follow-up. 3. Colonoscopy 2018.  Repeat at their recommended interval. 4. Mammography 12/2017.  Continue with annual mammography next year.  Breast  exam normal today. 5. Pap smear 2013.  No Pap smear done today.  No history of abnormal Pap smears.  We both agree to stop screening based on current recommendations and age. 6. Health maintenance.  No routine lab work done as patient does this elsewhere.  Follow-up 1 year, sooner as needed.   [name] MD, 9:58 AM 04/12/2018

## 2018-04-21 ENCOUNTER — Ambulatory Visit
Admission: RE | Admit: 2018-04-21 | Discharge: 2018-04-21 | Disposition: A | Discharge status: Home / Self Care | Payer: Medicare Other | Source: Ambulatory Visit | Attending: Internal Medicine | Admitting: Internal Medicine

## 2018-04-21 ENCOUNTER — Other Ambulatory Visit: Payer: Self-pay | Admitting: Internal Medicine

## 2018-04-21 DIAGNOSIS — M5489 Other dorsalgia: Secondary | ICD-10-CM

## 2018-04-26 ENCOUNTER — Other Ambulatory Visit: Payer: Self-pay | Admitting: Internal Medicine

## 2018-04-26 DIAGNOSIS — R937 Abnormal findings on diagnostic imaging of other parts of musculoskeletal system: Secondary | ICD-10-CM

## 2018-05-02 ENCOUNTER — Ambulatory Visit
Admission: RE | Admit: 2018-05-02 | Discharge: 2018-05-02 | Disposition: A | Discharge status: Home / Self Care | Payer: Medicare Other | Source: Ambulatory Visit | Attending: Internal Medicine | Admitting: Internal Medicine

## 2018-05-02 DIAGNOSIS — R937 Abnormal findings on diagnostic imaging of other parts of musculoskeletal system: Secondary | ICD-10-CM

## 2018-05-12 MED FILL — PLAVIX 75 MG TABLET: 75 | 30 days supply | Qty: 30 | Fill #0

## 2018-06-08 MED FILL — PLAVIX 75 MG TABLET: 75 | 30 days supply | Qty: 30 | Fill #1

## 2018-07-04 MED FILL — PLAVIX 75 MG TABLET: 75 | 30 days supply | Qty: 30 | Fill #2

## 2018-08-01 MED FILL — PLAVIX 75 MG TABLET: 75 | 30 days supply | Qty: 30 | Fill #3

## 2018-09-07 MED FILL — PLAVIX 75 MG TABLET: 75 | 30 days supply | Qty: 30 | Fill #4

## 2018-10-10 MED FILL — PLAVIX 75 MG TABLET: 75 | 30 days supply | Qty: 30 | Fill #5

## 2018-11-09 MED FILL — PLAVIX 75 MG TABLET: 75 | 30 days supply | Qty: 30 | Fill #6

## 2018-12-06 ENCOUNTER — Telehealth: Payer: Self-pay | Admitting: Internal Medicine

## 2018-12-06 NOTE — Telephone Encounter (Signed)
Patient with new rectal bleeding and rectal irritation.  She will come in and see Alonza Bogus, PA on 12/09/18 1:30.  She will try recticare until OV on Friday

## 2018-12-09 ENCOUNTER — Other Ambulatory Visit (INDEPENDENT_AMBULATORY_CARE_PROVIDER_SITE_OTHER): Payer: Medicare Other

## 2018-12-09 ENCOUNTER — Telehealth: Payer: Self-pay

## 2018-12-09 ENCOUNTER — Ambulatory Visit: Payer: Medicare Other | Admitting: Gastroenterology

## 2018-12-09 ENCOUNTER — Encounter: Payer: Self-pay | Admitting: Gastroenterology

## 2018-12-09 ENCOUNTER — Ambulatory Visit (INDEPENDENT_AMBULATORY_CARE_PROVIDER_SITE_OTHER): Payer: Medicare Other | Admitting: Gastroenterology

## 2018-12-09 VITALS — BP 110/52 | HR 74 | Temp 98.5°F | Ht 63.0 in | Wt 100.4 lb

## 2018-12-09 DIAGNOSIS — K625 Hemorrhage of anus and rectum: Secondary | ICD-10-CM

## 2018-12-09 DIAGNOSIS — R5383 Other fatigue: Secondary | ICD-10-CM

## 2018-12-09 DIAGNOSIS — K648 Other hemorrhoids: Secondary | ICD-10-CM

## 2018-12-09 LAB — BASIC METABOLIC PANEL
BUN: 19 mg/dL (ref 6–23)
CO2: 21 mEq/L (ref 19–32)
Calcium: 8.7 mg/dL (ref 8.4–10.5)
Chloride: 103 mEq/L (ref 96–112)
Creatinine, Ser: 0.83 mg/dL (ref 0.40–1.20)
GFR: 78.85 mL/min (ref >60.00)
Glucose, Bld: 85 mg/dL (ref 70–99)
Potassium: 3.7 mEq/L (ref 3.5–5.1)
Sodium: 130 mEq/L — ABNORMAL LOW (ref 135–145)

## 2018-12-09 LAB — CBC WITH DIFFERENTIAL/PLATELET
Basophils Absolute: 0 10*3/uL (ref 0.0–0.1)
Basophils Relative: 0.6 % (ref 0.0–3.0)
Eosinophils Absolute: 0.1 10*3/uL (ref 0.0–0.7)
Eosinophils Relative: 5 % (ref 0.0–5.0)
HCT: 33 % — ABNORMAL LOW (ref 36.0–46.0)
Hemoglobin: 10.9 g/dL — ABNORMAL LOW (ref 12.0–15.0)
Lymphocytes Relative: 18.6 % (ref 12.0–46.0)
Lymphs Abs: 0.5 10*3/uL — ABNORMAL LOW (ref 0.7–4.0)
MCHC: 32.9 g/dL (ref 30.0–36.0)
MCV: 94.9 fl (ref 78.0–100.0)
Monocytes Absolute: 0.3 10*3/uL (ref 0.1–1.0)
Monocytes Relative: 8.8 % (ref 3.0–12.0)
Neutro Abs: 2 10*3/uL (ref 1.4–7.7)
Neutrophils Relative %: 67 % (ref 43.0–77.0)
Platelets: 152 10*3/uL (ref 150.0–400.0)
RBC: 3.47 Mil/uL — ABNORMAL LOW (ref 3.87–5.11)
RDW: 14.1 % (ref 11.5–15.5)
WBC: 2.9 10*3/uL — ABNORMAL LOW (ref 4.0–10.5)

## 2018-12-09 MED ORDER — HYDROCORTISONE ACETATE 25 MG RE SUPP: 25.0000 mg | Freq: Every day | RECTAL | 1 refills | Status: DC

## 2018-12-09 NOTE — Patient Instructions (Addendum)
If you are age 83 or older, your body mass index should be between 23-30. Your Body mass index is 17.78 kg/m. If this is out of the aforementioned range listed, please consider follow up with your Primary Care Provider.  If you are age 50 or younger, your body mass index should be between 19-25. Your Body mass index is 17.78 kg/m. If this is out of the aformentioned range listed, please consider follow up with your Primary Care Provider.   To help prevent the possible spread of infection to our patients, communities, and staff; we will be implementing the following measures:  As of now we are not allowing any visitors/family members to accompany you to any upcoming appointments with [facility]. If you have any concerns about this please contact our office to discuss prior to the appointment.   Please go to the lab in the basement of our building to have lab work done as you leave today. Hit "B" for basement when you get on the elevator.  When the doors open the lab is on your left.  We will call you with the results. Thank you.  Take a daily fiber supplement such as Citrucel powder and Benefiber.  We have sent the following medications to your pharmacy for you to pick up at your convenience: Anusol suppositories: insert 1 suppository rectally every night at bedtime for 1 week then as needed thereafter.  (As an alternative if your insurance does not cover the Anusol, you can use a pea-sized amount of 1% HYDROCORTISONE CREAM into the rectum nightly in place of the Anusol).   Please call if no improvement within a week.  Thank you for entrusting me with your care and for choosing [facility], Dr. Merrillan Cellar

## 2018-12-09 NOTE — Telephone Encounter (Signed)
CBC and CMET results from today faxed to Dr. Glenna Durand office at Fax number: [phone]

## 2018-12-09 NOTE — Progress Notes (Addendum)
HPI :  85 y/o female, patient of Dr. Carlean Purl, history of C diff, history of TIA on Plavix, seen here today for an acute visit for rectal bleeding.   The patient is accompanied by her daughter today.  She reports being in her usual state of health when she started noticing some blood in her underwear, small amount.  She then noticed with some of her bowel movements she had blood noted on the toilet paper, but no blood in the stool.  This first started on Monday and she has had this a few times since then.  She had a bowel movement earlier today without any blood.  She does have some mild discomfort in her rectum with this but nothing significant.  She is having roughly 3 bowel movements per day, she says she does not strain and does not feel constipated.  She has been using some new toilet paper recently and perhaps that has irritated her.  She otherwise feels more fatigued than usual.  Her daughter states her primary care has been watching her potassium and sodium levels, it sounds like she has chronic hyponatremia from what the daughter says.  They are asking about her electrolyte levels today.  The patient takes Plavix and aspirin on a daily basis.  She also does take Protonix 20 mg once a day which her primary care put her on in January for some dyspepsia and nausea in the setting of aspirin and Plavix.  She reports the Protonix has worked well for her and she continues to take it daily.  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  At that time her colon appeared grossly normal, however a detailed exam was not done given the prep and purpose of the exam.  Colonoscopy 12/02/2016 - normal colon, fecal transplant performed for C Diff - detailed exam not done, purpose of FMT only    Past Medical History:  Diagnosis Date  . Anemia    years ago  . Arthritis    "left hand" (02/17/2013)  . C. difficile diarrhea   . Glaucoma    . High cholesterol    "on RX years ago" (02/17/2013)  . Hypertension   . Meningioma ([place]) 05/01/2015  . Osteoporosis 02/2014   T score -2.5  followed by Dr. Lysle Rubens  . Palpitations    PACs, PVCs and short runs of atrial tachycardia on Holter monitoring  . TIA (transient ischemic attack) 1990's     Past Surgical History:  Procedure Laterality Date  . ANAL FISTULECTOMY  1970  . COLONOSCOPY    . COLONOSCOPY WITH PROPOFOL N/A 12/02/2016   Procedure: COLONOSCOPY WITH PROPOFOL;  Surgeon: Gatha Mayer, MD;  Location[facility] ENDOSCOPY;  Service: Endoscopy;  Laterality: N/A;  . FECAL TRANSPLANT N/A 12/02/2016   Procedure: FECAL TRANSPLANT;  Surgeon: Gatha Mayer, MD;  Location[facility] ENDOSCOPY;  Service: Endoscopy;  Laterality: N/A;  . TONSILLECTOMY     Family History  Problem Relation Age of Onset  . Hypertension Father   . Heart attack Father   . Stroke Brother   . Stroke Maternal Grandmother    Social History   Tobacco Use  . Smoking status: Never Smoker  . Smokeless tobacco: Never Used  Substance Use Topics  . Alcohol use: No  . Drug use: No   Current Outpatient Medications  Medication Sig Dispense Refill  . aspirin (ECOTRIN LOW STRENGTH) 81 MG EC tablet Take  81 mg by mouth daily. Swallow whole.    . cholecalciferol (VITAMIN D) 1000 units tablet Take 1,000 Units by mouth daily.    . clopidogrel (PLAVIX) 75 MG tablet Take 75 mg by mouth daily.    . fluticasone (FLONASE) 50 MCG/ACT nasal spray Place 2 sprays into both nostrils daily as needed for allergies or rhinitis.    . folic acid (FOLVITE) 1 MG tablet Take 1 mg by mouth daily.    Marland Kitchen latanoprost (XALATAN) 0.005 % ophthalmic solution Place 1 drop into both eyes at bedtime.     Marland Kitchen loratadine (CLARITIN) 10 MG tablet Take 1 tablet (10 mg total) by mouth daily. 14 tablet 0  . hydrocortisone (ANUSOL-HC) 25 MG suppository Place 1 suppository (25 mg total) rectally at bedtime. Use for 7 days and then as needed, thereafter 12 suppository 1   . Propylene Glycol (SYSTANE BALANCE) 0.6 % SOLN Apply 1 drop to eye 2 (two) times daily. Left eye only      No current facility-administered medications for this visit.    Allergies  Allergen Reactions  . Demerol [Meperidine] Other (See Comments)    Excessive sweating, cramping  . Other     No Antibiotic without consultation with her primary physician.  . Codeine Rash  . Sulfa Antibiotics Itching and Other (See Comments)    Reaction unknown  . Tomato Itching     Review of Systems: All systems reviewed and negative except where noted in HPI.     Physical Exam: BP (!) 110/52 (BP Location: Left Arm, Patient Position: Sitting, Cuff Size: Normal)   Pulse 74   Temp 98.5 F (36.9 C) (Oral)   Ht 5\' 3"  (1.6 m)   Wt 100 lb 6 oz (45.5 kg)   SpO2 99%   BMI 17.78 kg/m  Constitutional: Pleasant, female in no acute distress. HEENT: Normocephalic and atraumatic. Conjunctivae are normal. No scleral icterus. Neck supple.  Cardiovascular: Normal rate, regular rhythm.  Pulmonary/chest: Effort normal and breath sounds normal. No wheezing, rales or rhonchi. Abdominal: Soft, nondistended, nontender. There are no masses palpable. No hepatomegaly. DRE / Anoscopy - CMA Tia Alert, no obvious fissure externally, inflamed internal hemorrhoid RA area,   There is a small sacral decubitus ulcer in the posterior gluteal cleft Extremities: no edema Lymphadenopathy: No cervical adenopathy noted. Neurological: Alert and oriented to person place and time. Skin: Skin is warm and dry. No rashes noted. Psychiatric: Normal mood and affect. Behavior is normal.   ASSESSMENT AND PLAN: 85 year old female here for reassessment of the following issues:  Rectal bleeding / internal hemorrhoids -superficial and scant amount of bleeding intermittently for this week, in the setting of aspirin and Plavix.  On DRE I do not see any obvious anal fissure within the distal anal canal, possible she has a subtle fissure more  proximally but anoscopy shows internal hemorrhoids which appear inflamed, one in the RA area, I suspect more likely is causing her symptoms.  I discussed options with the patient and her daughter.  Recommend she take a daily fiber supplement to keep stools as soft as possible and avoid straining.  Otherwise we will give her some Anusol to decrease the inflammation and see if that helps.  We will see how she does over the weekend and if symptoms are worsening heading into next week she should call us for reassessment.  If she has any significant bleeding or worsening she should hold her Plavix, however I do not think that is needed at this  time given the scant amount of bleeding she has had.  Otherwise given her fatigue as below will check a CBC to ensure her hemoglobin is stable. If sacral decub is new this otherwise needs attention by wound care if they haven't done that already. They agreed with the plan  Fatigue - will check CBC, also will check BMET per daughter's request given issues with electrolytes reportedly. Will let them know the results.   [place] Cellar, MD [facility]

## 2018-12-12 MED FILL — PLAVIX 75 MG TABLET: 75 | 30 days supply | Qty: 30 | Fill #7

## 2018-12-13 ENCOUNTER — Emergency Department (HOSPITAL_COMMUNITY)
Admission: EM | Admit: 2018-12-13 | Discharge: 2018-12-13 | Disposition: A | Discharge status: Home / Self Care | Payer: Medicare Other | Attending: Emergency Medicine | Admitting: Emergency Medicine

## 2018-12-13 ENCOUNTER — Encounter (HOSPITAL_COMMUNITY): Payer: Self-pay | Admitting: Emergency Medicine

## 2018-12-13 ENCOUNTER — Emergency Department (HOSPITAL_COMMUNITY): Payer: Medicare Other

## 2018-12-13 DIAGNOSIS — Z7982 Long term (current) use of aspirin: Secondary | ICD-10-CM | POA: Insufficient documentation

## 2018-12-13 DIAGNOSIS — I10 Essential (primary) hypertension: Secondary | ICD-10-CM | POA: Insufficient documentation

## 2018-12-13 DIAGNOSIS — E860 Dehydration: Secondary | ICD-10-CM | POA: Diagnosis not present

## 2018-12-13 DIAGNOSIS — R531 Weakness: Secondary | ICD-10-CM | POA: Diagnosis present

## 2018-12-13 DIAGNOSIS — Z79899 Other long term (current) drug therapy: Secondary | ICD-10-CM | POA: Diagnosis not present

## 2018-12-13 LAB — CBC WITH DIFFERENTIAL/PLATELET
Abs Immature Granulocytes: 0.05 10*3/uL (ref 0.00–0.07)
Basophils Absolute: 0 10*3/uL (ref 0.0–0.1)
Basophils Relative: 0 %
Eosinophils Absolute: 0.2 10*3/uL (ref 0.0–0.5)
Eosinophils Relative: 5 %
HCT: 33.1 % — ABNORMAL LOW (ref 36.0–46.0)
Hemoglobin: 10.8 g/dL — ABNORMAL LOW (ref 12.0–15.0)
Immature Granulocytes: 2 %
Lymphocytes Relative: 20 %
Lymphs Abs: 0.7 10*3/uL (ref 0.7–4.0)
MCH: 31.4 pg (ref 26.0–34.0)
MCHC: 32.6 g/dL (ref 30.0–36.0)
MCV: 96.2 fL (ref 80.0–100.0)
Monocytes Absolute: 0.3 10*3/uL (ref 0.1–1.0)
Monocytes Relative: 8 %
Neutro Abs: 2.1 10*3/uL (ref 1.7–7.7)
Neutrophils Relative %: 65 %
Platelets: 159 10*3/uL (ref 150–400)
RBC: 3.44 MIL/uL — ABNORMAL LOW (ref 3.87–5.11)
RDW: 14.8 % (ref 11.5–15.5)
WBC: 3.2 10*3/uL — ABNORMAL LOW (ref 4.0–10.5)
nRBC: 0 % (ref 0.0–0.2)

## 2018-12-13 LAB — COMPREHENSIVE METABOLIC PANEL
ALT: 72 U/L — ABNORMAL HIGH (ref 0–44)
AST: 127 U/L — ABNORMAL HIGH (ref 15–41)
Albumin: 2.6 g/dL — ABNORMAL LOW (ref 3.5–5.0)
Alkaline Phosphatase: 79 U/L (ref 38–126)
Anion gap: 8 (ref 5–15)
BUN: 16 mg/dL (ref 8–23)
CO2: 15 mmol/L — ABNORMAL LOW (ref 22–32)
Calcium: 8.4 mg/dL — ABNORMAL LOW (ref 8.9–10.3)
Chloride: 107 mmol/L (ref 98–111)
Creatinine, Ser: 0.88 mg/dL (ref 0.44–1.00)
GFR calc Af Amer: >60 mL/min (ref >60)
GFR calc non Af Amer: 59 mL/min — ABNORMAL LOW (ref >60)
Glucose, Bld: 90 mg/dL (ref 70–99)
Potassium: 3.5 mmol/L (ref 3.5–5.1)
Sodium: 130 mmol/L — ABNORMAL LOW (ref 135–145)
Total Bilirubin: 0.8 mg/dL (ref 0.3–1.2)
Total Protein: 6.2 g/dL — ABNORMAL LOW (ref 6.5–8.1)

## 2018-12-13 LAB — URINALYSIS, ROUTINE W REFLEX MICROSCOPIC
Bilirubin Urine: NEGATIVE
Glucose, UA: NEGATIVE mg/dL
Ketones, ur: NEGATIVE mg/dL
Nitrite: NEGATIVE
Protein, ur: 30 mg/dL — AB
Specific Gravity, Urine: 1.012 (ref 1.005–1.030)
pH: 6 (ref 5.0–8.0)

## 2018-12-13 MED ORDER — SODIUM CHLORIDE 0.9 % IV BOLUS
1000.0000 mL | Freq: Once | INTRAVENOUS | Status: AC
  Administered 2018-12-13: 1000 mL via INTRAVENOUS

## 2018-12-13 NOTE — ED Provider Notes (Signed)
[facility] EMERGENCY DEPARTMENT Provider Note   CSN: [id number] Arrival date & time: 12/13/18  1102    History   Chief Complaint Chief Complaint  Patient presents with   Weakness   Hypotension    HPI Sydney [name] is a 85 y.o. female presenting to the ED with daughter complaining of progressively worsening generalized weakness over the past 2 weeks.  Patient reports daughter is normally fairly independent and is able to ambulate and perform her ADLs on her own at home.  She reports the patient has recently had trouble getting out of bed or getting up from standing.  She reports patient has been too tired to be able to cook for herself.  She reports that patient has had multiple low blood pressure readings with her home cuff over the past 3 days with systolic pressures in the 80s.  Daughter reports patient was seen by her gastroenterologist on Friday for blood noticed on wiping.  She had stable hemoglobin at that time.  Daughter and patient deny any recent illness, fever, chills, cough, shortness of breath, dysuria, urinary frequency, abdominal pain, nausea, vomiting, diarrhea, or any other complaints.  Daughter reports patient has lost a few pounds recently and weighed 100 pounds on Friday from her normal weight of 103-104 pounds.      The history is provided by the patient and a relative.    Past Medical History:  Diagnosis Date   Anemia    years ago   Arthritis    "left hand" (02/17/2013)   C. difficile diarrhea    Glaucoma    High cholesterol    "on RX years ago" (02/17/2013)   Hypertension    Meningioma ([place]) 05/01/2015   Osteoporosis 02/2014   T score -2.5  followed by Dr. Lysle Rubens   Palpitations    PACs, PVCs and short runs of atrial tachycardia on Holter monitoring   TIA (transient ischemic attack) 1990's    Patient Active Problem List   Diagnosis Date Noted   Ataxia 10/02/2017   History of TIA (transient ischemic attack) 10/02/2017     HTN (hypertension) 10/02/2017   HLD (hyperlipidemia) 10/02/2017   Palpable mass of neck-?? lymph node 10/02/2017   Protein-calorie malnutrition, severe 10/20/2016   C. difficile diarrhea    Hypertension 10/17/2016   High cholesterol 10/17/2016   Recurrent colitis due to Clostridium difficile 10/17/2016   Dehydration with hyponatremia 10/17/2016   Acute hypokalemia 10/17/2016   Protein calorie malnutrition ([place]) 10/17/2016   C. 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   Diplopia 04/08/2015   Leukopenia 04/08/2015   Bradycardia 02/17/2013   Occlusion and stenosis of carotid artery without mention of cerebral infarction 02/17/2013    Past Surgical History:  Procedure Laterality Date   ANAL FISTULECTOMY  1970   COLONOSCOPY     COLONOSCOPY WITH PROPOFOL N/A 12/02/2016   Procedure: COLONOSCOPY WITH PROPOFOL;  Surgeon: Gatha Mayer, MD;  Location: [facility];  Service: Endoscopy;  Laterality: N/A;   FECAL TRANSPLANT N/A 12/02/2016   Procedure: FECAL TRANSPLANT;  Surgeon: Gatha Mayer, MD;  Location: [facility] ENDOSCOPY;  Service: Endoscopy;  Laterality: N/A;   TONSILLECTOMY  OB History    Gravida  1   Para  1   Term  1   Preterm      AB      Living  1     SAB      TAB      Ectopic      Multiple      Live Births               Home Medications    Prior to Admission medications   Medication Sig Start Date End Date Taking? Authorizing Provider  aspirin (ECOTRIN LOW STRENGTH) 81 MG EC tablet Take 81 mg by mouth daily. Swallow whole.   Yes [provider]  cholecalciferol  (VITAMIN D) 1000 units tablet Take 1,000 Units by mouth daily.   Yes [provider]  clopidogrel (PLAVIX) 75 MG tablet Take 75 mg by mouth daily.   Yes [provider]  fluticasone (FLONASE) 50 MCG/ACT nasal spray Place 2 sprays into both nostrils daily as needed for allergies or rhinitis. 10/06/16  Yes Hongalgi, Lenis Dickinson, MD  folic acid (FOLVITE) 1 MG tablet Take 1 mg by mouth daily.   Yes [provider]  hydrocortisone (ANUSOL-HC) 25 MG suppository Place 1 suppository (25 mg total) rectally at bedtime. Use for 7 days and then as needed, thereafter 12/09/18  Yes Armbruster, Carlota Raspberry, MD  latanoprost (XALATAN) 0.005 % ophthalmic solution Place 1 drop into both eyes at bedtime.    Yes [provider]  loratadine (CLARITIN) 10 MG tablet Take 1 tablet (10 mg total) by mouth daily. Patient taking differently: Take 10 mg by mouth daily as needed for allergies or rhinitis.  10/05/17  Yes Thurnell Lose, MD  pantoprazole (PROTONIX) 20 MG tablet Take 20 mg by mouth daily. 12/09/18  Yes [provider]    Family History Family History  Problem Relation Age of Onset   Hypertension Father    Heart attack Father    Stroke Brother    Stroke Maternal Grandmother     Social History Social History   Tobacco Use   Smoking status: Never Smoker   Smokeless tobacco: Never Used  Substance Use Topics   Alcohol use: No   Drug use: No     Allergies   Demerol [meperidine], Other, Codeine, Sulfa antibiotics, and Tomato   Review of Systems Review of Systems  Constitutional: Positive for fatigue. Negative for chills and fever.  HENT: Negative for ear pain and sore throat.   Eyes: Negative for pain and visual disturbance.  Respiratory: Negative for cough and shortness of breath.   Cardiovascular: Negative for chest pain and palpitations.  Gastrointestinal: Negative for abdominal pain, nausea and vomiting.  Genitourinary: Negative for dysuria,  frequency and hematuria.  Musculoskeletal: Negative for arthralgias and back pain.  Skin: Negative for color change and rash.  Neurological: Positive for weakness (generalized). Negative for seizures and syncope.  Psychiatric/Behavioral: Negative for agitation and behavioral problems.  All other systems reviewed and are negative.    Physical Exam Updated Vital Signs BP 124/62    Pulse 72    Temp 98.2 F (36.8 C) (Oral)    Resp 20    SpO2 99%   Physical Exam Vitals signs and nursing note reviewed.  Constitutional:      General: She is not in acute distress.    Appearance: She is normal weight. She is not ill-appearing, toxic-appearing or diaphoretic.     Comments: Cachectic elderly female in no  acute distress.  HENT:     Head: Normocephalic and atraumatic.     Nose: Nose normal. No congestion or rhinorrhea.     Mouth/Throat:     Mouth: Mucous membranes are dry.     Pharynx: Oropharynx is clear. No oropharyngeal exudate or posterior oropharyngeal erythema.  Eyes:     Extraocular Movements: Extraocular movements intact.     Pupils: Pupils are equal, round, and reactive to light.  Neck:     Musculoskeletal: Normal range of motion and neck supple. No neck rigidity or muscular tenderness.  Cardiovascular:     Rate and Rhythm: Normal rate and regular rhythm.     Pulses: Normal pulses.     Heart sounds: Normal heart sounds. No murmur. No friction rub. No gallop.   Pulmonary:     Effort: Pulmonary effort is normal. No respiratory distress.     Breath sounds: Normal breath sounds. No stridor. No wheezing, rhonchi or rales.  Abdominal:     General: Abdomen is flat. There is no distension.     Palpations: Abdomen is soft.     Tenderness: There is no abdominal tenderness. There is no guarding or rebound.  Musculoskeletal: Normal range of motion.        General: No swelling, tenderness, deformity or signs of injury.  Skin:    General: Skin is warm and dry.     Comments: Small  approximately 1.5 cm stage II pressure ulcer to the sacrum.  There are no signs of superimposed infection.  Neurological:     General: No focal deficit present.     Mental Status: She is alert and oriented to person, place, and time. Mental status is at baseline.     Cranial Nerves: No cranial nerve deficit.     Sensory: No sensory deficit.     Motor: No weakness.  Psychiatric:        Mood and Affect: Mood normal.        Behavior: Behavior normal.      ED Treatments / Results  Labs (all labs ordered are listed, but only abnormal results are displayed) Labs Reviewed  COMPREHENSIVE METABOLIC PANEL - Abnormal; Notable for the following components:      Result Value   Sodium 130 (*)    CO2 15 (*)    Calcium 8.4 (*)    Total Protein 6.2 (*)    Albumin 2.6 (*)    AST 127 (*)    ALT 72 (*)    GFR calc non Af Amer 59 (*)    All other components within normal limits  CBC WITH DIFFERENTIAL/PLATELET - Abnormal; Notable for the following components:   WBC 3.2 (*)    RBC 3.44 (*)    Hemoglobin 10.8 (*)    HCT 33.1 (*)    All other components within normal limits  URINALYSIS, ROUTINE W REFLEX MICROSCOPIC - Abnormal; Notable for the following components:   APPearance HAZY (*)    Hgb urine dipstick SMALL (*)    Protein, ur 30 (*)    Leukocytes,Ua TRACE (*)    Bacteria, UA RARE (*)    All other components within normal limits    EKG None  Radiology Dg Chest Portable 1 View  Result Date: 12/13/2018 CLINICAL DATA:  Weakness and cough EXAM: PORTABLE CHEST 1 VIEW COMPARISON:  10/02/2017 FINDINGS: Cardiac shadow is within normal limits. Aortic calcifications are again seen. The lungs are well aerated bilaterally. No focal infiltrate or sizable effusion is seen. IMPRESSION: No  acute abnormality noted. Electronically Signed   By: Inez Catalina M.D.   On: 12/13/2018 14:37    Procedures Procedures (including critical care time)  Medications Ordered in ED Medications  sodium chloride  0.9 % bolus 1,000 mL (1,000 mLs Intravenous New Bag/Given 12/13/18 1516)     Initial Impression / Assessment and Plan / ED Course  I have reviewed the triage vital signs and the nursing notes.  Pertinent labs & imaging results that were available during my care of the patient were reviewed by me and considered in my medical decision making (see chart for details).        Sydney [name] is a 85 y.o. female presenting to the ED with daughter complaining of progressively worsening generalized weakness over the past 2 weeks.  Physical exam is unremarkable other than dry mucous membranes and a small stage II pressure ulcer with no signs of superimposed infection.  Patient's blood pressure after being roomed has been 120s/60s.  BMP significant for mild hyponatremia at 130 and low bicarbonate of 15.  CBC significant for mild leukopenia at 3.2 and her anemia 10.8, stable from CBC 4 days ago.  Urinalysis shows no sign of acute infection.  Chest x-ray shows no acute cardiopulmonary abnormalities.  Patient was given 1 L bolus of normal saline.  On reassessment, she reports she is feeling well.  Daughter was advised to follow-up closely with patient's primary care physician.  She was advised patient may need an outpatient nutrition consult due to her cachexia and poor nutritional intake.   Final Clinical Impressions(s) / ED Diagnoses   Final diagnoses:  Dehydration    ED Discharge Orders    None       Candie Chroman, MD 12/13/18 1635    Carmin Muskrat, MD 12/15/18 [phone]

## 2018-12-13 NOTE — ED Notes (Signed)
Daughter just informed me that the GI doctor did tell her she has a ulcer on her bottom and has received referral  to wound care.

## 2018-12-13 NOTE — ED Triage Notes (Signed)
Pt arrives with her daughter to the ED for weakness progressively worse over the last 1 week.daughter also states she has had a low bp in the 80's for the last 2-3 days. Per daughter when she stands she just feels so weak she sits back down, denies any falls but daughter reports having to catch her on multiple times. Pt did see GI on Friday for seeing blood while wiping her stool- daughter reports stable blood counts at that time but had low NA level. Pt is alert and ox4 . bp 97/74 in triage

## 2018-12-13 NOTE — ED Notes (Signed)
Pt verbalized understanding of discharge paperwork and follow-up care.  °

## 2018-12-15 ENCOUNTER — Encounter (HOSPITAL_BASED_OUTPATIENT_CLINIC_OR_DEPARTMENT_OTHER): Payer: Medicare Other

## 2018-12-15 ENCOUNTER — Encounter (HOSPITAL_COMMUNITY): Payer: Self-pay | Admitting: *Deleted

## 2018-12-15 ENCOUNTER — Emergency Department (HOSPITAL_COMMUNITY)
Admission: EM | Admit: 2018-12-15 | Discharge: 2018-12-15 | Disposition: A | Discharge status: Home / Self Care | Payer: Medicare Other | Source: Home / Self Care | Attending: Emergency Medicine | Admitting: Emergency Medicine

## 2018-12-15 ENCOUNTER — Emergency Department (HOSPITAL_COMMUNITY): Payer: Medicare Other

## 2018-12-15 ENCOUNTER — Telehealth: Payer: Self-pay | Admitting: Gastroenterology

## 2018-12-15 DIAGNOSIS — R531 Weakness: Secondary | ICD-10-CM

## 2018-12-15 DIAGNOSIS — R110 Nausea: Secondary | ICD-10-CM | POA: Insufficient documentation

## 2018-12-15 DIAGNOSIS — I10 Essential (primary) hypertension: Secondary | ICD-10-CM | POA: Insufficient documentation

## 2018-12-15 DIAGNOSIS — Z79899 Other long term (current) drug therapy: Secondary | ICD-10-CM | POA: Insufficient documentation

## 2018-12-15 DIAGNOSIS — Z7982 Long term (current) use of aspirin: Secondary | ICD-10-CM | POA: Insufficient documentation

## 2018-12-15 DIAGNOSIS — Z20828 Contact with and (suspected) exposure to other viral communicable diseases: Secondary | ICD-10-CM | POA: Insufficient documentation

## 2018-12-15 LAB — T4, FREE: Free T4: 0.76 ng/dL (ref 0.61–1.12)

## 2018-12-15 LAB — COMPREHENSIVE METABOLIC PANEL
ALT: 77 U/L — ABNORMAL HIGH (ref 0–44)
AST: 137 U/L — ABNORMAL HIGH (ref 15–41)
Albumin: 2.5 g/dL — ABNORMAL LOW (ref 3.5–5.0)
Alkaline Phosphatase: 82 U/L (ref 38–126)
Anion gap: 7 (ref 5–15)
BUN: 15 mg/dL (ref 8–23)
CO2: 17 mmol/L — ABNORMAL LOW (ref 22–32)
Calcium: 8.4 mg/dL — ABNORMAL LOW (ref 8.9–10.3)
Chloride: 109 mmol/L (ref 98–111)
Creatinine, Ser: 0.85 mg/dL (ref 0.44–1.00)
GFR calc Af Amer: >60 mL/min (ref >60)
GFR calc non Af Amer: >60 mL/min (ref >60)
Glucose, Bld: 86 mg/dL (ref 70–99)
Potassium: 3.8 mmol/L (ref 3.5–5.1)
Sodium: 133 mmol/L — ABNORMAL LOW (ref 135–145)
Total Bilirubin: 0.8 mg/dL (ref 0.3–1.2)
Total Protein: 5.9 g/dL — ABNORMAL LOW (ref 6.5–8.1)

## 2018-12-15 LAB — CBC WITH DIFFERENTIAL/PLATELET
Abs Immature Granulocytes: 0.04 10*3/uL (ref 0.00–0.07)
Basophils Absolute: 0 10*3/uL (ref 0.0–0.1)
Basophils Relative: 0 %
Eosinophils Absolute: 0.2 10*3/uL (ref 0.0–0.5)
Eosinophils Relative: 6 %
HCT: 32.8 % — ABNORMAL LOW (ref 36.0–46.0)
Hemoglobin: 10.9 g/dL — ABNORMAL LOW (ref 12.0–15.0)
Immature Granulocytes: 1 %
Lymphocytes Relative: 19 %
Lymphs Abs: 0.6 10*3/uL — ABNORMAL LOW (ref 0.7–4.0)
MCH: 31.4 pg (ref 26.0–34.0)
MCHC: 33.2 g/dL (ref 30.0–36.0)
MCV: 94.5 fL (ref 80.0–100.0)
Monocytes Absolute: 0.3 10*3/uL (ref 0.1–1.0)
Monocytes Relative: 9 %
Neutro Abs: 2.2 10*3/uL (ref 1.7–7.7)
Neutrophils Relative %: 65 %
Platelets: 148 10*3/uL — ABNORMAL LOW (ref 150–400)
RBC: 3.47 MIL/uL — ABNORMAL LOW (ref 3.87–5.11)
RDW: 14.6 % (ref 11.5–15.5)
WBC: 3.3 10*3/uL — ABNORMAL LOW (ref 4.0–10.5)
nRBC: 0 % (ref 0.0–0.2)

## 2018-12-15 LAB — TSH: TSH: 1.271 u[IU]/mL (ref 0.350–4.500)

## 2018-12-15 LAB — URINALYSIS, ROUTINE W REFLEX MICROSCOPIC
Bilirubin Urine: NEGATIVE
Glucose, UA: NEGATIVE mg/dL
Ketones, ur: NEGATIVE mg/dL
Leukocytes,Ua: NEGATIVE
Nitrite: NEGATIVE
Protein, ur: NEGATIVE mg/dL
Specific Gravity, Urine: 1.004 — ABNORMAL LOW (ref 1.005–1.030)
pH: 6 (ref 5.0–8.0)

## 2018-12-15 LAB — TROPONIN I (HIGH SENSITIVITY): Troponin I (High Sensitivity): 3 ng/L (ref <18)

## 2018-12-15 LAB — CBG MONITORING, ED: Glucose-Capillary: 72 mg/dL (ref 70–99)

## 2018-12-15 LAB — SARS CORONAVIRUS 2 BY RT PCR (HOSPITAL ORDER, PERFORMED IN ~~LOC~~ HOSPITAL LAB): SARS Coronavirus 2: NEGATIVE

## 2018-12-15 MED ORDER — SODIUM CHLORIDE 0.9 % IV BOLUS
1000.0000 mL | Freq: Once | INTRAVENOUS | Status: AC
  Administered 2018-12-15: 1000 mL via INTRAVENOUS

## 2018-12-15 NOTE — Discharge Instructions (Signed)
Today you were seen in the emergency room for weakness. We did several laboratory tests as well as an EKG of your heart and we did not detect an abnormality that would explain your symptoms. We also did lab studies of your thyroid and those were normal. The CT scan of your head did not show any acute abnormality and the meningioma previously detected was unchanged in size.  We have provided you with a referral to a cardiologist ([facility]). We want you to call within the next week to setup an appointment with them. They may recommend further studies such as prolonged cardiac monitoring to detect any rhythm abnormalities in your heart. We also want you to followup with your primary care provider.   Please return to the emergency room if your symptoms worsen, if you develop fever, chest pain, shortness of breath or other concerning symptoms.  Thank you for allowing [place] to be part of your medical care

## 2018-12-15 NOTE — ED Triage Notes (Signed)
Patient presents to ed via [facility] states she was discharged from hospital 8/18 for weakness,  Patient states she was getting her bath this am and became weak and nauseated states she sat in the floor didn't fall just lowered herself to the door.  Alert oriented denies pain.

## 2018-12-15 NOTE — ED Provider Notes (Signed)
[facility] EMERGENCY DEPARTMENT Provider Note   CSN: [id number] Arrival date & time: 12/15/18  0709     History   Chief Complaint Chief Complaint  Patient presents with  . Weakness    HPI Sydney [name] is a 85 y.o. female with PMH of HTN, HLD, osteoporosis, and arthritis presents with a two-week history of generalized weakness.  Patient presented 2 days ago with similar complaints along with hypotension and after work-up with urinalysis, CBC, BMP, chest x-ray, EKG patient was assessed to be dehydrated and given 1 L normal saline with improvement.  Daughter reports that the patient has been moving slowly and this morning when she was walking from the bathroom went down.  Daughter states patient went down slowly and she was assisting patient.  Patient did not hit her head or experience other trauma other than a cut on her toe.  Daughter denies LOC and says she was alert the whole time and responding to questions.  Patient says that she did not feel lightheaded at time of event, she states that she just felt weak and like she could not walk anymore, and to avoid falling she wanted to lie on the ground.  Per daughter patient has been walking slowly past couple of weeks, sleeps a lot, and has been eating a little less than normal.  Patient reports some mild nausea this morning, denies vomiting, denies chest pain, headaches, shortness of breath, fever, chills, dysuria, urgency, frequency.     HPI  Past Medical History:  Diagnosis Date  . Anemia    years ago  . Arthritis    "left hand" (02/17/2013)  . C. difficile diarrhea   . Glaucoma   . High cholesterol    "on RX years ago" (02/17/2013)  . Hypertension   . Meningioma ([place]) 05/01/2015  . Osteoporosis 02/2014   T score -2.5  followed by Dr. Lysle Rubens  . Palpitations    PACs, PVCs and short runs of atrial tachycardia on Holter monitoring  . TIA (transient ischemic attack) 1990's    Patient Active Problem List   Diagnosis Date Noted  . Ataxia 10/02/2017  . History of TIA (transient ischemic attack) 10/02/2017  . HTN (hypertension) 10/02/2017  . HLD (hyperlipidemia) 10/02/2017  . Palpable mass of neck-?? lymph node 10/02/2017  . Protein-calorie malnutrition, severe 10/20/2016  . C. difficile diarrhea   . Hypertension 10/17/2016  . High cholesterol 10/17/2016  . Recurrent colitis due to Clostridium difficile 10/17/2016  . Dehydration with hyponatremia 10/17/2016  . Acute hypokalemia 10/17/2016  . Protein calorie malnutrition ([place]) 10/17/2016  . C. difficile colitis 10/05/2016  . Physical deconditioning 10/05/2016  . Recurrent Clostridium difficile diarrhea 10/05/2016  . Balance problem 10/05/2016  . History of transient ischemic attack (TIA) 10/05/2016  . Dehydration   . Hypokalemia 05/14/2016  . Anemia 05/14/2016  . Hyponatremia 05/14/2016  . Glaucoma 05/14/2016  . Meningioma ([place]) 05/01/2015  . 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 Diplopia 04/08/2015  . Leukopenia 04/08/2015  . Bradycardia 02/17/2013  . Occlusion and stenosis of carotid artery without mention of cerebral infarction 02/17/2013    Past Surgical History:  Procedure Laterality Date  . ANAL FISTULECTOMY  1970  . COLONOSCOPY    . COLONOSCOPY WITH PROPOFOL N/A 12/02/2016   Procedure: COLONOSCOPY WITH PROPOFOL;  Surgeon: Gatha Mayer, MD;  Location: [facility] ENDOSCOPY;  Service: Endoscopy;  Laterality: N/A;  . 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 TONSILLECTOMY       OB History    Gravida  1   Para  1   Term  1   Preterm      AB      Living  1     SAB      TAB      Ectopic      Multiple      Live Births               Home Medications    Prior to Admission medications   Medication Sig Start Date End Date Taking? Authorizing Provider  aspirin (ECOTRIN LOW STRENGTH) 81 MG EC  tablet Take 81 mg by mouth daily. Swallow whole.    [provider]  cholecalciferol (VITAMIN D) 1000 units tablet Take 1,000 Units by mouth daily.    [provider]  clopidogrel (PLAVIX) 75 MG tablet Take 75 mg by mouth daily.    [provider]  fluticasone (FLONASE) 50 MCG/ACT nasal spray Place 2 sprays into both nostrils daily as needed for allergies or rhinitis. 10/06/16   Hongalgi, Lenis Dickinson, MD  folic acid (FOLVITE) 1 MG tablet Take 1 mg by mouth daily.    [provider]  hydrocortisone (ANUSOL-HC) 25 MG suppository Place 1 suppository (25 mg total) rectally at bedtime. Use for 7 days and then as needed, thereafter 12/09/18   Armbruster, Carlota Raspberry, MD  latanoprost (XALATAN) 0.005 % ophthalmic solution Place 1 drop into both eyes at bedtime.     [provider]  loratadine (CLARITIN) 10 MG tablet Take 1 tablet (10 mg total) by mouth daily. Patient taking differently: Take 10 mg by mouth daily as needed for allergies or rhinitis.  10/05/17   Thurnell Lose, MD  pantoprazole (PROTONIX) 20 MG tablet Take 20 mg by mouth daily. 12/09/18   [provider]    Family History Family History  Problem Relation Age of Onset  . Hypertension Father   . Heart attack Father   . Stroke Brother   . Stroke Maternal Grandmother     Social History Social History   Tobacco Use  . Smoking status: Never Smoker  . Smokeless tobacco: Never Used  Substance Use Topics  . Alcohol use: No  . Drug use: No     Allergies   Demerol [meperidine], Other, Codeine, Sulfa antibiotics, and Tomato   Review of Systems Review of Systems  Constitutional: Negative for chills and fever.  Respiratory: Negative for cough and shortness of breath.   Cardiovascular: Negative for chest pain.  Gastrointestinal: Negative for abdominal pain, diarrhea, nausea and vomiting.  Genitourinary: Negative for dysuria.  All other systems reviewed and are negative.    Physical  Exam Updated Vital Signs BP 124/80   Pulse 66   Temp 98.8 F (37.1 C) (Oral)   Resp 18   SpO2 99%   Physical Exam Constitutional:      Appearance: She is well-developed.  HENT:     Head: Normocephalic and atraumatic.  Eyes:     Extraocular Movements: Extraocular movements intact.  Cardiovascular:     Rate and Rhythm: Normal rate and regular rhythm.     Heart sounds: No murmur. No friction rub. No gallop.   Pulmonary:     Breath sounds: Normal breath sounds. No wheezing, rhonchi or rales.  Abdominal:     General: Abdomen is flat. There is no distension.  Palpations: Abdomen is soft. There is no mass.     Tenderness: There is no abdominal tenderness.  Musculoskeletal:        General: No swelling or tenderness.  Skin:    General: Skin is warm and dry.     Comments: 2 cm ulcer at sacral base, clean margins without purulent drainage  Neurological:     Mental Status: She is alert.  Psychiatric:        Mood and Affect: Mood normal.        Behavior: Behavior normal.      ED Treatments / Results  Labs (all labs ordered are listed, but only abnormal results are displayed) Labs Reviewed  CBC WITH DIFFERENTIAL/PLATELET - Abnormal; Notable for the following components:      Result Value   WBC 3.3 (*)    RBC 3.47 (*)    Hemoglobin 10.9 (*)    HCT 32.8 (*)    Platelets 148 (*)    Lymphs Abs 0.6 (*)    All other components within normal limits  COMPREHENSIVE METABOLIC PANEL - Abnormal; Notable for the following components:   Sodium 133 (*)    CO2 17 (*)    Calcium 8.4 (*)    Total Protein 5.9 (*)    Albumin 2.5 (*)    AST 137 (*)    ALT 77 (*)    All other components within normal limits  URINALYSIS, ROUTINE W REFLEX MICROSCOPIC - Abnormal; Notable for the following components:   Color, Urine STRAW (*)    Specific Gravity, Urine 1.004 (*)    Hgb urine dipstick SMALL (*)    Bacteria, UA RARE (*)    All other components within normal limits  SARS CORONAVIRUS 2  (HOSPITAL ORDER, [facility])  URINE CULTURE  TSH  T4, FREE  CBG MONITORING, ED  TROPONIN I (HIGH SENSITIVITY)    EKG EKG Interpretation  Date/Time:  Thursday December 15 2018 07:15:09 EDT Ventricular Rate:  67 PR Interval:    QRS Duration: 71 QT Interval:  375 QTC Calculation: 396 R Axis:   38 Text Interpretation:  Sinus rhythm No significant change since last tracing Confirmed by Gareth Morgan [phone]) on 12/15/2018 10:41:58 AM   Radiology Ct Head Wo Contrast  Result Date: 12/15/2018 CLINICAL DATA:  Intermittent weakness for the past 2 days. Known left-sided meningioma. EXAM: CT HEAD WITHOUT CONTRAST TECHNIQUE: Contiguous axial images were obtained from the base of the skull through the vertex without intravenous contrast. COMPARISON:  Head CT dated 10/02/2017 and brain MR dated 10/02/2017. FINDINGS: Brain: Mild-to-moderate patchy white matter low density in both cerebral hemispheres without significant change. Stable small left parafalcine meningioma. No intracranial hemorrhage, new mass lesion or CT evidence of acute infarction. Vascular: No hyperdense vessel or unexpected calcification. Skull: Normal. Negative for fracture or focal lesion. Sinuses/Orbits: Unremarkable. Other: None. IMPRESSION: 1. No acute abnormality. 2. Stable mild-to-moderate atrophy and chronic small vessel white matter ischemic changes in both cerebral hemispheres. 3. Stable small left parafalcine meningioma. Electronically Signed   By: Claudie Revering M.D.   On: 12/15/2018 14:16    Procedures Procedures (including critical care time)  Medications Ordered in ED Medications  sodium chloride 0.9 % bolus 1,000 mL (0 mLs Intravenous Stopped 12/15/18 1009)  sodium chloride 0.9 % bolus 1,000 mL (0 mLs Intravenous Stopped 12/15/18 1458)     Initial Impression / Assessment and Plan / ED Course  I have reviewed the triage vital signs and the nursing  notes.  Pertinent labs & imaging results  that were available during my care of the patient were reviewed by me and considered in my medical decision making (see chart for details).  Patient is an 85 year old female who presents following a near syncopal event after 2 weeks of generalized weakness.  Patient did not have white count elevation, COVID negative, hemoglobin stable at 10.9, TSH/T4 without significant abnormality, troponin of 3, AST/ALT mildly elevated but unchanged from prior level.  CT head showed no acute abnormality and demonstrated a stable small left parafalcine meningioma.  Patient was given 1 L normal saline bolus x2.  Patient was advised to follow-up with nutrition as outpatient as well as cardiology for consideration of further cardiac monitoring.    Discussed with daughter and mother the potential benefits of an inpatient hospitalization.  Patient did not desire to stay in the hospital and both daughter and patient comfortable with plan for outpatient follow-up.        Final Clinical Impressions(s) / ED Diagnoses   Final diagnoses:  None    ED Discharge Orders    None       Jeanmarie Hubert, MD 12/15/18 [place], MD 12/17/18 1041

## 2018-12-15 NOTE — Telephone Encounter (Signed)
Patient's daughter called this evening. Patient has had progressive weakness and poor appetite since I've seen her in the office recently. She has not had any further rectal bleeding, the Anusol. She fell and went to the ED today, her labs look fairly stable but has a new AST > ALT elevation which is mild. They gave her fluids, the patient was at the ED all day today, and sent her home. She denies any pain but daughter says her muscles seem contracted.  Daughter quite concerned about deterioration since I've seen her. She is needing to help her ambulate due to the weakness, not sure if she can manage at home and take care of her. She asks if the Anusol has caused this and I reassured her that would be very unlikely. I'm not sure what is causing her weakness and issues, but given AST > ALT elevation would send a CK level to ensure she does not have a myositis / myopathy, and will also check acute viral hep panel but that seems unlikely. Given how much help the patient is needing to ambulate and her progressively debilitated state, hypoalbuminemia, I think she warrants admission to the hospital. Daughter had discussed this with the ED staff but decided to take her home, but now thinking she is not sure she can handle this. She is going to keep the patient at home tonight so she can sleep and reassess her tomorrow AM. I advised her to contact her PCP tomorrow AM for advice if she does not go to the hospital, as I don't think this is a primary GI issue. Daughter will see how she does tonight. If she does not go back to the hospital, I have ordered some labs to be drawn from our office tomorrow as above. She agreed.  Ed Blalock, I saw this patient of yours while you were out of the office recently

## 2018-12-16 ENCOUNTER — Emergency Department (HOSPITAL_COMMUNITY): Payer: Medicare Other

## 2018-12-16 ENCOUNTER — Telehealth: Payer: Self-pay | Admitting: Internal Medicine

## 2018-12-16 ENCOUNTER — Other Ambulatory Visit: Payer: Self-pay

## 2018-12-16 ENCOUNTER — Inpatient Hospital Stay (HOSPITAL_COMMUNITY)
Admission: EM | Admit: 2018-12-16 | Discharge: 2018-12-18 | DRG: 689 | Disposition: A | Discharge status: Home Health | Payer: Medicare Other | Attending: Internal Medicine | Admitting: Internal Medicine

## 2018-12-16 DIAGNOSIS — M810 Age-related osteoporosis without current pathological fracture: Secondary | ICD-10-CM | POA: Diagnosis present

## 2018-12-16 DIAGNOSIS — H9190 Unspecified hearing loss, unspecified ear: Secondary | ICD-10-CM | POA: Diagnosis present

## 2018-12-16 DIAGNOSIS — D649 Anemia, unspecified: Secondary | ICD-10-CM | POA: Diagnosis present

## 2018-12-16 DIAGNOSIS — K648 Other hemorrhoids: Secondary | ICD-10-CM | POA: Diagnosis present

## 2018-12-16 DIAGNOSIS — Z7982 Long term (current) use of aspirin: Secondary | ICD-10-CM

## 2018-12-16 DIAGNOSIS — E872 Acidosis, unspecified: Secondary | ICD-10-CM

## 2018-12-16 DIAGNOSIS — R945 Abnormal results of liver function studies: Secondary | ICD-10-CM | POA: Diagnosis not present

## 2018-12-16 DIAGNOSIS — E43 Unspecified severe protein-calorie malnutrition: Secondary | ICD-10-CM | POA: Diagnosis present

## 2018-12-16 DIAGNOSIS — D320 Benign neoplasm of cerebral meninges: Secondary | ICD-10-CM | POA: Diagnosis present

## 2018-12-16 DIAGNOSIS — E871 Hypo-osmolality and hyponatremia: Secondary | ICD-10-CM | POA: Diagnosis not present

## 2018-12-16 DIAGNOSIS — I959 Hypotension, unspecified: Secondary | ICD-10-CM | POA: Diagnosis present

## 2018-12-16 DIAGNOSIS — Z885 Allergy status to narcotic agent status: Secondary | ICD-10-CM

## 2018-12-16 DIAGNOSIS — W1839XA Other fall on same level, initial encounter: Secondary | ICD-10-CM | POA: Diagnosis present

## 2018-12-16 DIAGNOSIS — R21 Rash and other nonspecific skin eruption: Secondary | ICD-10-CM | POA: Diagnosis present

## 2018-12-16 DIAGNOSIS — Z8249 Family history of ischemic heart disease and other diseases of the circulatory system: Secondary | ICD-10-CM

## 2018-12-16 DIAGNOSIS — Y9301 Activity, walking, marching and hiking: Secondary | ICD-10-CM | POA: Diagnosis present

## 2018-12-16 DIAGNOSIS — R7401 Elevation of levels of liver transaminase levels: Secondary | ICD-10-CM | POA: Diagnosis present

## 2018-12-16 DIAGNOSIS — I10 Essential (primary) hypertension: Secondary | ICD-10-CM | POA: Diagnosis present

## 2018-12-16 DIAGNOSIS — S91119A Laceration without foreign body of unspecified toe without damage to nail, initial encounter: Secondary | ICD-10-CM | POA: Diagnosis present

## 2018-12-16 DIAGNOSIS — Z20828 Contact with and (suspected) exposure to other viral communicable diseases: Secondary | ICD-10-CM | POA: Diagnosis present

## 2018-12-16 DIAGNOSIS — L8992 Pressure ulcer of unspecified site, stage 2: Secondary | ICD-10-CM

## 2018-12-16 DIAGNOSIS — L89152 Pressure ulcer of sacral region, stage 2: Secondary | ICD-10-CM | POA: Diagnosis present

## 2018-12-16 DIAGNOSIS — D329 Benign neoplasm of meninges, unspecified: Secondary | ICD-10-CM | POA: Diagnosis present

## 2018-12-16 DIAGNOSIS — H409 Unspecified glaucoma: Secondary | ICD-10-CM | POA: Diagnosis present

## 2018-12-16 DIAGNOSIS — E785 Hyperlipidemia, unspecified: Secondary | ICD-10-CM | POA: Diagnosis present

## 2018-12-16 DIAGNOSIS — N390 Urinary tract infection, site not specified: Principal | ICD-10-CM | POA: Diagnosis present

## 2018-12-16 DIAGNOSIS — Z79899 Other long term (current) drug therapy: Secondary | ICD-10-CM

## 2018-12-16 DIAGNOSIS — R740 Nonspecific elevation of levels of transaminase and lactic acid dehydrogenase [LDH]: Secondary | ICD-10-CM | POA: Diagnosis present

## 2018-12-16 DIAGNOSIS — Y92009 Unspecified place in unspecified non-institutional (private) residence as the place of occurrence of the external cause: Secondary | ICD-10-CM

## 2018-12-16 DIAGNOSIS — E46 Unspecified protein-calorie malnutrition: Secondary | ICD-10-CM | POA: Diagnosis present

## 2018-12-16 DIAGNOSIS — R262 Difficulty in walking, not elsewhere classified: Secondary | ICD-10-CM | POA: Diagnosis present

## 2018-12-16 DIAGNOSIS — Z823 Family history of stroke: Secondary | ICD-10-CM

## 2018-12-16 DIAGNOSIS — Z8673 Personal history of transient ischemic attack (TIA), and cerebral infarction without residual deficits: Secondary | ICD-10-CM

## 2018-12-16 DIAGNOSIS — Z882 Allergy status to sulfonamides status: Secondary | ICD-10-CM

## 2018-12-16 DIAGNOSIS — E860 Dehydration: Secondary | ICD-10-CM | POA: Diagnosis present

## 2018-12-16 DIAGNOSIS — Z7902 Long term (current) use of antithrombotics/antiplatelets: Secondary | ICD-10-CM

## 2018-12-16 DIAGNOSIS — R531 Weakness: Secondary | ICD-10-CM

## 2018-12-16 DIAGNOSIS — Z681 Body mass index (BMI) 19 or less, adult: Secondary | ICD-10-CM

## 2018-12-16 DIAGNOSIS — D61818 Other pancytopenia: Secondary | ICD-10-CM | POA: Diagnosis present

## 2018-12-16 DIAGNOSIS — R5381 Other malaise: Secondary | ICD-10-CM | POA: Diagnosis present

## 2018-12-16 DIAGNOSIS — L8990 Pressure ulcer of unspecified site, unspecified stage: Secondary | ICD-10-CM | POA: Diagnosis present

## 2018-12-16 LAB — CBC WITH DIFFERENTIAL/PLATELET
Abs Immature Granulocytes: 0.03 10*3/uL (ref 0.00–0.07)
Basophils Absolute: 0 10*3/uL (ref 0.0–0.1)
Basophils Relative: 0 %
Eosinophils Absolute: 0.1 10*3/uL (ref 0.0–0.5)
Eosinophils Relative: 4 %
HCT: 32.2 % — ABNORMAL LOW (ref 36.0–46.0)
Hemoglobin: 10.7 g/dL — ABNORMAL LOW (ref 12.0–15.0)
Immature Granulocytes: 1 %
Lymphocytes Relative: 18 %
Lymphs Abs: 0.6 10*3/uL — ABNORMAL LOW (ref 0.7–4.0)
MCH: 31.8 pg (ref 26.0–34.0)
MCHC: 33.2 g/dL (ref 30.0–36.0)
MCV: 95.8 fL (ref 80.0–100.0)
Monocytes Absolute: 0.4 10*3/uL (ref 0.1–1.0)
Monocytes Relative: 12 %
Neutro Abs: 2.3 10*3/uL (ref 1.7–7.7)
Neutrophils Relative %: 65 %
Platelets: 127 10*3/uL — ABNORMAL LOW (ref 150–400)
RBC: 3.36 MIL/uL — ABNORMAL LOW (ref 3.87–5.11)
RDW: 14.7 % (ref 11.5–15.5)
WBC: 3.4 10*3/uL — ABNORMAL LOW (ref 4.0–10.5)
nRBC: 0.6 % — ABNORMAL HIGH (ref 0.0–0.2)

## 2018-12-16 LAB — URINALYSIS, ROUTINE W REFLEX MICROSCOPIC
Bilirubin Urine: NEGATIVE
Glucose, UA: NEGATIVE mg/dL
Ketones, ur: NEGATIVE mg/dL
Nitrite: NEGATIVE
Protein, ur: 30 mg/dL — AB
Specific Gravity, Urine: 1.003 — ABNORMAL LOW (ref 1.005–1.030)
pH: 8 (ref 5.0–8.0)

## 2018-12-16 LAB — URINE CULTURE: Culture: <10000 — AB

## 2018-12-16 LAB — CK: Total CK: 140 U/L (ref 38–234)

## 2018-12-16 LAB — COMPREHENSIVE METABOLIC PANEL
ALT: 74 U/L — ABNORMAL HIGH (ref 0–44)
AST: 146 U/L — ABNORMAL HIGH (ref 15–41)
Albumin: 2.3 g/dL — ABNORMAL LOW (ref 3.5–5.0)
Alkaline Phosphatase: 78 U/L (ref 38–126)
Anion gap: 5 (ref 5–15)
BUN: 13 mg/dL (ref 8–23)
CO2: 16 mmol/L — ABNORMAL LOW (ref 22–32)
Calcium: 8.2 mg/dL — ABNORMAL LOW (ref 8.9–10.3)
Chloride: 110 mmol/L (ref 98–111)
Creatinine, Ser: 0.86 mg/dL (ref 0.44–1.00)
GFR calc Af Amer: >60 mL/min (ref >60)
GFR calc non Af Amer: >60 mL/min (ref >60)
Glucose, Bld: 88 mg/dL (ref 70–99)
Potassium: 4.3 mmol/L (ref 3.5–5.1)
Sodium: 131 mmol/L — ABNORMAL LOW (ref 135–145)
Total Bilirubin: 1.1 mg/dL (ref 0.3–1.2)
Total Protein: 5.5 g/dL — ABNORMAL LOW (ref 6.5–8.1)

## 2018-12-16 LAB — TROPONIN I (HIGH SENSITIVITY)
Troponin I (High Sensitivity): 3 ng/L (ref <18)
Troponin I (High Sensitivity): <2 ng/L (ref <18)

## 2018-12-16 LAB — PROTIME-INR
INR: 1.1 (ref 0.8–1.2)
Prothrombin Time: 13.6 seconds (ref 11.4–15.2)

## 2018-12-16 MED ORDER — ASPIRIN EC 81 MG PO TBEC
81.0000 mg | DELAYED_RELEASE_TABLET | Freq: Every day | ORAL | Status: DC
  Administered 2018-12-16 – 2018-12-18 (×3): 81 mg via ORAL
  Filled 2018-12-16 (×3): qty 1

## 2018-12-16 MED ORDER — ONDANSETRON HCL 4 MG/2ML IJ SOLN: 4.0000 mg | Freq: Four times a day (QID) | INTRAMUSCULAR | Status: DC | PRN

## 2018-12-16 MED ORDER — ONDANSETRON HCL 4 MG PO TABS
4.0000 mg | ORAL_TABLET | Freq: Four times a day (QID) | ORAL | Status: DC | PRN
  Administered 2018-12-17: 4 mg via ORAL
  Filled 2018-12-16: qty 1

## 2018-12-16 MED ORDER — HYDROCORTISONE ACETATE 25 MG RE SUPP: 25.0000 mg | Freq: Every day | RECTAL | Status: DC

## 2018-12-16 MED ORDER — CLOPIDOGREL BISULFATE 75 MG PO TABS
75.0000 mg | ORAL_TABLET | Freq: Every day | ORAL | Status: DC
  Administered 2018-12-16 – 2018-12-18 (×3): 75 mg via ORAL
  Filled 2018-12-16 (×3): qty 1

## 2018-12-16 MED ORDER — LATANOPROST 0.005 % OP SOLN
1.0000 [drp] | Freq: Every day | OPHTHALMIC | Status: DC
  Administered 2018-12-16 – 2018-12-17 (×2): 1 [drp] via OPHTHALMIC
  Filled 2018-12-16: qty 2.5

## 2018-12-16 MED ORDER — FOLIC ACID 1 MG PO TABS
1.0000 mg | ORAL_TABLET | Freq: Every day | ORAL | Status: DC
  Administered 2018-12-16 – 2018-12-18 (×3): 1 mg via ORAL
  Filled 2018-12-16 (×3): qty 1

## 2018-12-16 MED ORDER — SODIUM CHLORIDE 0.9 % IV SOLN
1.0000 g | INTRAVENOUS | Status: DC
  Administered 2018-12-16 – 2018-12-17 (×2): 1 g via INTRAVENOUS
  Filled 2018-12-16 (×2): qty 10

## 2018-12-16 MED ORDER — ENOXAPARIN SODIUM 30 MG/0.3ML ~~LOC~~ SOLN
30.0000 mg | SUBCUTANEOUS | Status: DC
  Administered 2018-12-16 – 2018-12-17 (×2): 30 mg via SUBCUTANEOUS
  Filled 2018-12-16 (×4): qty 0.3

## 2018-12-16 MED ORDER — FLUTICASONE PROPIONATE 50 MCG/ACT NA SUSP: 2.0000 | Freq: Every day | NASAL | Status: DC | PRN

## 2018-12-16 MED ORDER — SODIUM CHLORIDE 0.9 % IV SOLN
Freq: Once | INTRAVENOUS | Status: AC
  Administered 2018-12-16: 23:00:00 via INTRAVENOUS

## 2018-12-16 MED ORDER — SODIUM CHLORIDE 0.9 % IV SOLN
Freq: Once | INTRAVENOUS | Status: AC
  Administered 2018-12-16: 13:00:00 via INTRAVENOUS

## 2018-12-16 MED ORDER — ENSURE ENLIVE PO LIQD
237.0000 mL | Freq: Two times a day (BID) | ORAL | Status: DC
  Administered 2018-12-17 – 2018-12-18 (×3): 237 mL via ORAL

## 2018-12-16 MED ORDER — PANTOPRAZOLE SODIUM 20 MG PO TBEC
20.0000 mg | DELAYED_RELEASE_TABLET | Freq: Every day | ORAL | Status: DC
  Administered 2018-12-17 – 2018-12-18 (×2): 20 mg via ORAL
  Filled 2018-12-16 (×2): qty 1

## 2018-12-16 MED ORDER — LORATADINE 10 MG PO TABS: 10.0000 mg | ORAL_TABLET | Freq: Every day | ORAL | Status: DC | PRN

## 2018-12-16 MED ORDER — SODIUM CHLORIDE 0.9% FLUSH
3.0000 mL | Freq: Two times a day (BID) | INTRAVENOUS | Status: DC
  Administered 2018-12-16 – 2018-12-17 (×3): 3 mL via INTRAVENOUS

## 2018-12-16 NOTE — Progress Notes (Signed)
PT Cancellation Note  Patient Details Name: Sydney [name] MRN: [id number] DOB: 02/21/1931   Cancelled Treatment:    Reason Eval/Treat Not Completed: Fatigue/lethargy limiting ability to participate.  Will reattempt at another time as pt can tolerate.   Ramond Dial 12/16/2018, 5:55 PM   Mee Hives, PT MS Acute Rehab Dept. Number: [place] and [place]

## 2018-12-16 NOTE — ED Provider Notes (Signed)
[name] EMERGENCY DEPARTMENT Provider Note   CSN: [id number] Arrival date & time: 12/16/18  1104     History   Chief Complaint Chief Complaint  Patient presents with  . Weakness    HPI Sydney [name] is a 85 y.o. female.  With a chief complaint of generalized weakness.  History provided by patient and daughter.  Over the past few weeks patient has had steadily worsening weakness, now to the point where she is having difficulty walking and getting around the house.  A couple weeks ago patient had an episode noting blood in her stool, GI concerned about hemorrhoids and place patient on enema.  Patient no longer has any blood in stool, blood in underwear.  Denies any abdominal pain, diarrhea or persistent constipation.  Patient was seen in the emergency department yesterday, concern for dehydration and mildly elevated LFTs.  They had discussed admission at that time however per ED provider note, daughter and patient declined admission and desired to go home and pursue outpatient management.  However over the last 24hours daughter reports that patient has had increased weakness to the point where she is unable to ambulate without assistance or get around the house.  Patient denies any falls but did say she slid down hitting her right hip.  Denies any ongoing pain at this time, specifically no chest pain, abdominal pain.  No fevers or cough or sick contacts.  Has had decreased appetite, but daughter reports still taking decent p.o. fluids.    HPI  Past Medical History:  Diagnosis Date  . Anemia    years ago  . Arthritis    "left hand" (02/17/2013)  . C. difficile diarrhea   . Glaucoma   . High cholesterol    "on RX years ago" (02/17/2013)  . Hypertension   . Meningioma ([place]) 05/01/2015  . Osteoporosis 02/2014   T score -2.5  followed by Dr. Lysle Rubens  . Palpitations    PACs, PVCs and short runs of atrial tachycardia on Holter monitoring  . TIA (transient ischemic  attack) 1990's    Patient Active Problem List   Diagnosis Date Noted  . Ataxia 10/02/2017  . History of TIA (transient ischemic attack) 10/02/2017  . HTN (hypertension) 10/02/2017  . HLD (hyperlipidemia) 10/02/2017  . Palpable mass of neck-?? lymph node 10/02/2017  . Protein-calorie malnutrition, severe 10/20/2016  . C. difficile diarrhea   . Hypertension 10/17/2016  . High cholesterol 10/17/2016  . Recurrent colitis due to Clostridium difficile 10/17/2016  . Dehydration with hyponatremia 10/17/2016  . Acute hypokalemia 10/17/2016  . Protein calorie malnutrition ([place]) 10/17/2016  . C. difficile colitis 10/05/2016  . Physical deconditioning 10/05/2016  . Recurrent Clostridium difficile diarrhea 10/05/2016  . Balance problem 10/05/2016  . History of transient ischemic attack (TIA) 10/05/2016  . Dehydration   . Hypokalemia 05/14/2016  . Anemia 05/14/2016  . Hyponatremia 05/14/2016  . Glaucoma 05/14/2016  . Meningioma ([place]) 05/01/2015  . 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 Diplopia 04/08/2015  . Leukopenia 04/08/2015  . Bradycardia 02/17/2013  . Occlusion and stenosis of carotid artery without mention of cerebral infarction 02/17/2013    Past Surgical History:  Procedure Laterality Date  . ANAL FISTULECTOMY  1970  . COLONOSCOPY    . COLONOSCOPY WITH PROPOFOL N/A 12/02/2016   Procedure: COLONOSCOPY WITH PROPOFOL;  Surgeon: Gatha Mayer, MD;  Location: [facility] ENDOSCOPY;  Service: Endoscopy;  Laterality: N/A;  . 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 TONSILLECTOMY       OB History    Gravida  1   Para  1   Term  1   Preterm      AB      Living  1     SAB      TAB      Ectopic      Multiple      Live Births               Home Medications    Prior to Admission medications   Medication Sig Start Date End Date Taking? Authorizing  Provider  aspirin (ECOTRIN LOW STRENGTH) 81 MG EC tablet Take 81 mg by mouth daily. Swallow whole.    [provider]  cholecalciferol (VITAMIN D) 1000 units tablet Take 1,000 Units by mouth daily.    [provider]  clopidogrel (PLAVIX) 75 MG tablet Take 75 mg by mouth daily.    [provider]  fluticasone (FLONASE) 50 MCG/ACT nasal spray Place 2 sprays into both nostrils daily as needed for allergies or rhinitis. 10/06/16   Hongalgi, Lenis Dickinson, MD  folic acid (FOLVITE) 1 MG tablet Take 1 mg by mouth daily.    [provider]  hydrocortisone (ANUSOL-HC) 25 MG suppository Place 1 suppository (25 mg total) rectally at bedtime. Use for 7 days and then as needed, thereafter 12/09/18   Armbruster, Carlota Raspberry, MD  latanoprost (XALATAN) 0.005 % ophthalmic solution Place 1 drop into both eyes at bedtime.     [provider]  loratadine (CLARITIN) 10 MG tablet Take 1 tablet (10 mg total) by mouth daily. Patient taking differently: Take 10 mg by mouth daily as needed for allergies or rhinitis.  10/05/17   Thurnell Lose, MD  pantoprazole (PROTONIX) 20 MG tablet Take 20 mg by mouth daily. 12/09/18   [provider]    Family History Family History  Problem Relation Age of Onset  . Hypertension Father   . Heart attack Father   . Stroke Brother   . Stroke Maternal Grandmother     Social History Social History   Tobacco Use  . Smoking status: Never Smoker  . Smokeless tobacco: Never Used  Substance Use Topics  . Alcohol use: No  . Drug use: No     Allergies   Demerol [meperidine], Other, Codeine, Sulfa antibiotics, and Tomato   Review of Systems Review of Systems  Constitutional: Positive for appetite change and fatigue. Negative for chills and fever.  HENT: Negative for ear pain and sore throat.   Eyes: Negative for pain and visual disturbance.  Respiratory: Negative for cough and shortness of breath.   Cardiovascular: Negative for  chest pain and palpitations.  Gastrointestinal: Negative for abdominal pain and vomiting.  Genitourinary: Negative for dysuria and hematuria.  Musculoskeletal: Positive for arthralgias. Negative for back pain.  Skin: Negative for color change and rash.  Neurological: Negative for seizures and syncope.  All other systems reviewed and are negative.    Physical Exam Updated Vital Signs There were no vitals taken for this visit.  Physical Exam Vitals signs and nursing note reviewed.  Constitutional:      General: She is not in acute distress.    Appearance: She is well-developed.     Comments: Elderly  HENT:     Head: Normocephalic and atraumatic.  Eyes:     Conjunctiva/sclera:  Conjunctivae normal.  Neck:     Musculoskeletal: Neck supple.  Cardiovascular:     Rate and Rhythm: Normal rate and regular rhythm.     Heart sounds: No murmur.  Pulmonary:     Effort: Pulmonary effort is normal. No respiratory distress.     Breath sounds: Normal breath sounds.  Abdominal:     Palpations: Abdomen is soft.     Tenderness: There is no abdominal tenderness.  Skin:    General: Skin is warm and dry.  Neurological:     Mental Status: She is alert.     Comments: Alert and oriented x3, 5 out of 5 strength in bilateral upper and lower extremities, sensation intact in bilateral upper and lower extremities      ED Treatments / Results  Labs (all labs ordered are listed, but only abnormal results are displayed) Labs Reviewed - No data to display  EKG None  Radiology Ct Head Wo Contrast  Result Date: 12/15/2018 CLINICAL DATA:  Intermittent weakness for the past 2 days. Known left-sided meningioma. EXAM: CT HEAD WITHOUT CONTRAST TECHNIQUE: Contiguous axial images were obtained from the base of the skull through the vertex without intravenous contrast. COMPARISON:  Head CT dated 10/02/2017 and brain MR dated 10/02/2017. FINDINGS: Brain: Mild-to-moderate patchy white matter low density in  both cerebral hemispheres without significant change. Stable small left parafalcine meningioma. No intracranial hemorrhage, new mass lesion or CT evidence of acute infarction. Vascular: No hyperdense vessel or unexpected calcification. Skull: Normal. Negative for fracture or focal lesion. Sinuses/Orbits: Unremarkable. Other: None. IMPRESSION: 1. No acute abnormality. 2. Stable mild-to-moderate atrophy and chronic small vessel white matter ischemic changes in both cerebral hemispheres. 3. Stable small left parafalcine meningioma. Electronically Signed   By: Claudie Revering M.D.   On: 12/15/2018 14:16    Procedures Procedures (including critical care time)  Medications Ordered in ED Medications - No data to display   Initial Impression / Assessment and Plan / ED Course  I have reviewed the triage vital signs and the nursing notes.  Pertinent labs & imaging results that were available during my care of the patient were reviewed by me and considered in my medical decision making (see chart for details).  Clinical Course as of Dec 16 1147  Fri Dec 16, 2018  1130 Completed chart review - PCP and GI notes   [RD]  1148 Completed initial assessment   [RD]    Clinical Course User Index [RD] Lucrezia Starch, MD       85 year old lady who presented to the emergency department with a chief complaint of generalized weakness, debility.  Here noted to have stable vital signs, no acute distress.  Lab work concerning for worsening transaminitis, persistent low bicarb.  No associated abdominal pain, nausea or vomiting.  Etiology for this finding and her significant decline in functional status not clear at this time.  Given above, believe patient would benefit from inpatient admission, will start fluids, consult to gastroenterology who agreed to evaluate patient, consulted hospitalist, Dr. Tamala Julian will admit patient.  Final Clinical Impressions(s) / ED Diagnoses   Final diagnoses:  Dehydration  Acidosis   Transaminitis  Debility    ED Discharge Orders    None       Lucrezia Starch, MD 12/16/18 1350

## 2018-12-16 NOTE — Progress Notes (Signed)
Sydney [name] is a 85 y.o. female patient admitted from ED awake, alert - oriented  X 4 - no acute distress noted.  VSS - Blood pressure 133/76, pulse 68, temperature 98.4 F (36.9 C), temperature source Oral, resp. rate (!) 22, height 5\' 3"  (1.6 m), weight 45.5 kg, SpO2 97 %.    IV in place, occlusive dsg intact without redness.  Orientation to room, and floor completed with information packet given to patient/family.  Patient and family able to verbalize understanding of risk associated with falls, and verbalized understanding to call nsg before up out of bed.  Call light within reach, patient able to voice, and demonstrate understanding.   Will cont to eval and treat per MD orders.  Patience Musca, RN 12/16/2018 6:45 PM

## 2018-12-16 NOTE — Telephone Encounter (Signed)
Spoke with pts daughter and she is aware and will call pts PCP.

## 2018-12-16 NOTE — ED Notes (Signed)
Urine culture sent to lab.

## 2018-12-16 NOTE — ED Triage Notes (Signed)
Pt presents with EMS from home for generalized weakness x4 days. Seen in ED recently 8/18 for hypotension and 8/20 for weakness, discharged home from ED. Today, has new R upper leg pain and weakness that prevents her from ambulating independently (usually does not require assistive device, but now needs daughter's assistance), pain made worse by movement and palpation.  Called PCP, was told to return to ED.  Liver enzymes elevated at last PCP visit.

## 2018-12-16 NOTE — H&P (Addendum)
History and Physical    Sydney [name] [id number] DOB: 29-Mar-1931 DOA: 12/16/2018  Referring MD/NP/PA: Audrie Lia, MD PCP: [name], MD  Patient coming from: Home  Chief Complaint: Weakness  I have personally briefly reviewed patient's old medical records in [place]   HPI: Sydney [name] is a 85 y.o. female with medical history significant of hypertension, hyperlipidemia, TIA, meningioma hyponatremia, anemia, glaucoma, and arthritis; who presents with complaints of generalized weakness.  History is provided by the patient and her daughter who is present at bedside.  At baseline the patient is normally able to complete all of her ADLs without assistance, and does not use any assistive aids to ambulate. She had recently been seen by gastroenterology on the 14th for rectal bleeding and started on hydrocortisone suppositories.  Her daughter question if the symptoms were secondary to the suppositories.  Rectal bleeding had since resolved. However, this is her third ED visit in the last week.  First seen on the 18th where her weakness symptoms were thought to be secondary to dehydration, and patient was given IV fluids with improvement.  She presented back to the hospital 2 days later for generalized weakness.  Reportedly she had left the bathroom and appeared unsteady on her feet.  Daughter states that she was able to catch her before she fell.  Patient had no loss of consciousness.  She also reports patient having low systolic blood pressures into the 80s which is unusual for her.  Since that visit patient still was not quite back to her baseline.  At home her daughter states that she was requiring significant assistance to stand which was unusual.  Patient reports that it feels like her legs just cannot hold of her weight.  Associated symptoms include some nausea and decreased p.o. intake.  Previous work-up had included CT scans of her head which showed stable meningioma and  thyroid testing which was within normal limits..  ED Course: On admission into the emergency department patient was noted to have a temperature up to 100.2 F, pulse 53-66, respirations 15-22, and all other vital signs maintained.  Labs revealed WBC 3.4, hemoglobin 10.7, platelets 127, sodium 131, potassium 4.3, chloride 110, BUN 13, creatinine 0.86, and glucose 88.  COVID-19 screening negative.  Urinalysis not significant for signs of infection.  Patient was given 1 L normal saline IV fluids.  Gastroenterology was formally consulted for continued transaminitis.  Review of Systems  Constitutional: Positive for malaise/fatigue. Negative for fever.  HENT: Positive for hearing loss. Negative for congestion and nosebleeds.   Eyes: Negative for photophobia and pain.  Respiratory: Negative for cough and shortness of breath.   Cardiovascular: Negative for chest pain and leg swelling.  Gastrointestinal: Positive for nausea. Negative for abdominal pain, diarrhea and vomiting.  Genitourinary: Negative for hematuria.  Musculoskeletal: Negative for joint pain.  Skin: Negative for itching and rash.  Neurological: Positive for weakness. Negative for sensory change and loss of consciousness.  Psychiatric/Behavioral: Negative for substance abuse.    Past Medical History:  Diagnosis Date   Anemia    years ago   Arthritis    "left hand" (02/17/2013)   C. difficile diarrhea    Glaucoma    High cholesterol    "on RX years ago" (02/17/2013)   Hypertension    Meningioma ([place]) 05/01/2015   Osteoporosis 02/2014   T score -2.5  followed by Dr. 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 She has never used smokeless tobacco. She reports that she does not drink alcohol or use drugs.  Allergies  Allergen Reactions   Demerol [Meperidine] Other (See Comments)    Excessive sweating, cramping   Other     No Antibiotic without consultation with her primary physician.   Codeine Rash   Sulfa Antibiotics Itching and Other (See Comments)    Reaction unknown   Tomato Itching    Family History  Problem Relation Age of Onset   Hypertension Father    Heart attack Father    Stroke Brother    Stroke Maternal Grandmother     Prior to Admission medications   Medication Sig Start Date End Date Taking? Authorizing Provider  aspirin (ECOTRIN LOW STRENGTH) 81 MG EC tablet Take 81 mg by mouth daily. Swallow whole.    [provider]  cholecalciferol (VITAMIN D) 1000 units tablet Take 1,000 Units by mouth daily.    [provider]  clopidogrel (PLAVIX) 75 MG tablet Take 75 mg by mouth daily.    [provider]  fluticasone (FLONASE) 50 MCG/ACT nasal spray Place 2 sprays into both nostrils daily as needed for allergies or rhinitis. 10/06/16   Hongalgi, Lenis Dickinson, MD  folic acid (FOLVITE) 1 MG tablet Take 1 mg by mouth daily.    [provider]  hydrocortisone (ANUSOL-HC) 25 MG suppository Place 1 suppository (25 mg total) rectally at bedtime. Use for 7 days and then as needed, thereafter 12/09/18   Armbruster, Carlota Raspberry, MD  latanoprost (XALATAN) 0.005 % ophthalmic solution Place 1 drop into both eyes at bedtime.     [provider]  loratadine (CLARITIN) 10 MG tablet Take 1 tablet (10 mg total) by mouth daily. Patient taking differently: Take 10 mg by mouth daily  as needed for allergies or rhinitis.  10/05/17   Thurnell Lose, MD  pantoprazole (PROTONIX) 20 MG tablet Take 20 mg by mouth daily. 12/09/18   [provider]    Physical Exam:  Constitutional: Elderly female who appears to be in no acute distress Vitals:   12/16/18 1145 12/16/18 1200 12/16/18 1215 12/16/18 1230  BP: 122/70 (!) 129/56 (!) 125/58 125/67  Pulse: 66     Resp: 17 (!) 21 18 19   Temp:      TempSrc:      SpO2: 100%     Weight:      Height:       Eyes: PERRL, lids and conjunctivae normal ENMT: Mucous membranes are moist. Posterior pharynx clear of any exudate or lesions.  Patient has very hard of hearing.  Neck: normal, supple, no masses, no thyromegaly Respiratory: clear to auscultation bilaterally, no wheezing, no crackles. Normal respiratory effort. No accessory muscle use.  Cardiovascular: Regular rate and rhythm, no murmurs / rubs / gallops. No extremity edema. 2+ pedal pulses. No carotid bruits.  Abdomen: no tenderness, no masses palpated.  No hepatosplenomegaly. Bowel sounds positive.  Musculoskeletal: no clubbing / cyanosis. No joint deformity upper and lower extremities. Good ROM, no contractures. Normal muscle tone.  Skin: Stage II pressure ulcer of her sacrum present. Neurologic: CN 2-12 grossly intact. Sensation intact, DTR normal. Strength 4+/5 in all 4.  Psychiatric: Normal judgment and insight. Alert and oriented x 3. Normal mood.     Labs on Admission: I have personally reviewed following labs and imaging studies  CBC: Recent Labs  Lab 12/09/18 1504 12/13/18 1114 12/15/18 0811 12/16/18 1115  WBC 2.9* 3.2* 3.3* 3.4*  NEUTROABS 2.0 2.1 2.2 2.3  HGB 10.9* 10.8* 10.9* 10.7*  HCT 33.0* 33.1* 32.8* 32.2*  MCV 94.9 96.2 94.5 95.8  PLT 152.0 159 148* [id number]*   Basic Metabolic Panel: Recent Labs  Lab 12/09/18 1504 12/13/18 1114 12/15/18 0811 12/16/18 1115  NA 130* 130* 133* 131*  K 3.7 3.5 3.8 4.3  CL 103 107 109 110  CO2 21 15* 17* 16*    GLUCOSE 85 90 86 88  BUN 19 16 15 13   CREATININE 0.83 0.88 0.85 0.86  CALCIUM 8.7 8.4* 8.4* 8.2*   GFR: Estimated Creatinine Clearance: 33.7 mL/min (by C-G formula based on SCr of 0.86 mg/dL). Liver Function Tests: Recent Labs  Lab 12/13/18 1114 12/15/18 0811 12/16/18 1115  AST 127* 137* 146*  ALT 72* 77* 74*  ALKPHOS 79 82 78  BILITOT 0.8 0.8 1.1  PROT 6.2* 5.9* 5.5*  ALBUMIN 2.6* 2.5* 2.3*   No results for input(s): LIPASE, AMYLASE in the last 168 hours. No results for input(s): AMMONIA in the last 168 hours. Coagulation Profile: No results for input(s): INR, PROTIME in the last 168 hours. Cardiac Enzymes: Recent Labs  Lab 12/16/18 1115  CKTOTAL 140   BNP (last 3 results) No results for input(s): PROBNP in the last 8760 hours. HbA1C: No results for input(s): HGBA1C in the last 72 hours. CBG: Recent Labs  Lab 12/15/18 0718  GLUCAP 72   Lipid Profile: No results for input(s): CHOL, HDL, LDLCALC, TRIG, CHOLHDL, LDLDIRECT in the last 72 hours. Thyroid Function Tests: Recent Labs    12/15/18 1247  TSH 1.271  FREET4 0.76   Anemia Panel: No results for input(s): VITAMINB12, FOLATE, FERRITIN, TIBC, IRON, RETICCTPCT in the last 72 hours. Urine analysis:    Component Value Date/Time   COLORURINE STRAW (A) 12/15/2018 1009   APPEARANCEUR CLEAR 12/15/2018 1009   LABSPEC 1.004 (L) 12/15/2018 1009   PHURINE 6.0 12/15/2018 1009   GLUCOSEU NEGATIVE 12/15/2018 1009   HGBUR SMALL (A) 12/15/2018 1009   BILIRUBINUR NEGATIVE 12/15/2018 1009   KETONESUR NEGATIVE 12/15/2018 1009   PROTEINUR NEGATIVE 12/15/2018 1009   UROBILINOGEN 0.2 11-16-202014 1048   NITRITE NEGATIVE 12/15/2018 1009   LEUKOCYTESUR NEGATIVE 12/15/2018 1009   Sepsis Labs: Recent Results (from the past 240 hour(s))  Urine culture     Status: Abnormal   Collection Time: 12/15/18  8:11 AM   Specimen: Urine, Random  Result Value Ref Range Status   Specimen Description URINE, RANDOM  Final   Special  Requests NONE  Final   Culture (A)  Final    <10,000 COLONIES/mL INSIGNIFICANT GROWTH Performed at [facility], [place] [address] [place], [place] [address]    Report Status 12/16/2018 FINAL  Final  SARS Coronavirus 2 [facility] order, Performed in [facility] hospital lab) Nasopharyngeal Nasopharyngeal Swab     Status: None   Collection Time: 12/15/18  8:11 AM   Specimen: Nasopharyngeal Swab  Result Value Ref Range Status   SARS Coronavirus 2 NEGATIVE NEGATIVE Final    Comment: (NOTE) If result is NEGATIVE SARS-CoV-2 target nucleic acids are NOT DETECTED. The SARS-CoV-2 RNA is generally detectable in upper and lower  respiratory specimens during the acute phase of infection. The lowest  concentration of SARS-CoV-2 viral copies this assay can detect is 250  copies / mL. A negative result does not preclude SARS-CoV-2 infection  and should not be used as the sole basis for treatment or other  patient management decisions.  A negative result may occur with  improper specimen collection / handling, submission of specimen other  than nasopharyngeal swab, presence of viral mutation(s) within the  areas targeted by this assay, and inadequate number of viral copies  (<250 copies / mL). A negative result must be combined with clinical  observations, patient history, and epidemiological information. If result is POSITIVE SARS-CoV-2 target nucleic acids are DETECTED. The SARS-CoV-2 RNA is generally detectable in upper and lower  respiratory specimens dur ing the acute phase of infection.  Positive  results are indicative of active infection with SARS-CoV-2.  Clinical  correlation with patient history and other diagnostic information is  necessary to determine patient infection status.  Positive results do  not rule out bacterial infection or co-infection with other viruses. If result is PRESUMPTIVE POSTIVE SARS-CoV-2 nucleic acids MAY BE PRESENT.   A presumptive positive result was  obtained on the submitted specimen  and confirmed on repeat testing.  While 2019 novel coronavirus  (SARS-CoV-2) nucleic acids may be present in the submitted sample  additional confirmatory testing may be necessary for epidemiological  and / or clinical management purposes  to differentiate between  SARS-CoV-2 and other Sarbecovirus currently known to infect humans.  If clinically indicated additional testing with an alternate test  methodology [phone]) is advised. The SARS-CoV-2 RNA is generally  detectable in upper and lower respiratory sp ecimens during the acute  phase of infection. The expected result is Negative. Fact Sheet for Patients:  StrictlyIdeas.no Fact Sheet for Healthcare Providers: BankingDealers.co.za This test is not yet approved or cleared by the [place] FDA and has been authorized for detection and/or diagnosis of SARS-CoV-2 by FDA under an Emergency Use Authorization (EUA).  This EUA will remain in effect (meaning this test can be used) for the duration of the COVID-19 declaration under Section 564(b)(1) of the Act, 21 U.S.C. section 360bbb-3(b)(1), unless the authorization is terminated or revoked sooner. Performed at [facility], [place] [address], [place], [place] [address]      Radiological Exams on Admission: Ct Head Wo Contrast  Result Date: 12/15/2018 CLINICAL DATA:  Intermittent weakness for the past 2 days. Known left-sided meningioma. EXAM: CT HEAD WITHOUT CONTRAST TECHNIQUE: Contiguous axial images were obtained from the base of the skull through the vertex without intravenous contrast. COMPARISON:  Head CT dated 10/02/2017 and brain MR dated 10/02/2017. FINDINGS: Brain: Mild-to-moderate patchy white matter low density in both cerebral hemispheres without significant change. Stable small left parafalcine meningioma. No intracranial hemorrhage, new mass lesion or CT evidence of acute infarction.  Vascular: No hyperdense vessel or unexpected calcification. Skull: Normal. Negative for fracture or focal lesion. Sinuses/Orbits: Unremarkable. Other: None. IMPRESSION: 1. No acute abnormality. 2. Stable mild-to-moderate atrophy and chronic small vessel white matter ischemic changes in both cerebral hemispheres. 3. Stable small left parafalcine meningioma. Electronically Signed   By: Claudie Revering M.D.   On: 12/15/2018 14:16   Dg Chest Portable 1  View  Result Date: 12/16/2018 CLINICAL DATA:  Shortness of breath EXAM: PORTABLE CHEST 1 VIEW COMPARISON:  12/13/2018 FINDINGS: Cardiac shadow is stable. Aortic calcifications are again seen. The lungs are well aerated bilaterally. No focal infiltrate or sizable effusion is seen. Skin folds are noted over the left chest. No bony abnormality is seen. IMPRESSION: No acute abnormality noted. Electronically Signed   By: Inez Catalina M.D.   On: 12/16/2018 12:09   Dg Hip Unilat W Or Wo Pelvis 1 View Right  Result Date: 12/16/2018 CLINICAL DATA:  Fall,,confused ,,,,rt hip tenderness EXAM: DG HIP (WITH OR WITHOUT PELVIS) 1V RIGHT COMPARISON:  None. FINDINGS: No acute fracture or subluxation there is atherosclerotic calcification of the femoral arteries bilaterally. Bowel gas pattern is nonobstructive. IMPRESSION: No evidence for acute abnormality. Electronically Signed   By: Nolon Nations M.D.   On: 12/16/2018 12:58    EKG: Independently reviewed.  Sinus rhythm at 62 bpm  Assessment/Plan Generalized weakness: Acute.  Patient presents with generalized weakness unable to stand. CT scan of the brain negative for any acute abnormalities.  Unclear if symptoms are secondary to dehydration and/or infection vs arrhythmia. Stroke appears less likely. -Admit to a medical telemetry bed  -Neurochecks every 4 hours x4 -Check orthostatic vital signs -Physical therapy and Occupational therapy to evaluate and treat -Follow-up telemetry overnight  Urinary tract infection:  Acute.  Urinalysis positive for many bacteria with large leukocytes 6-10 WBCs. -Check urine culture -Rocephin IV  Transaminitis: Acute.  AST elevated at 146 and ALT 74. -Follow-up hepatitis panel -Appreciate GI consultative services, will follow-up any further recommendations.  Hypotension: Patient reportedly having systolic blood pressures as low as the 80s. -Continue IV fluids  Stage II pressure ulcer: Acute.  Patient with what appears to be stage II pressure ulcer of the sacrum. -Wound care consult  Hyponatremia: Acute on chronic.  Sodium 131 on admission.  Patient with decreased p.o. intake. -Recheck BMP in a.m.  Pancytopenia: Acute on chronic.  WBC 3.4, hemoglobin 10.7, and platelet count 127.  Thrombocytopenia is new.  Leukopenia could be secondary to urinary tract infection and hemoglobin appears near patient's baseline. -Recheck CBC in a.m.  Hypoalbuminemia: Acute.  Albumin noted to be as low as 2.3 today. -Check prealbumin in a.m. -Ensure shakes between meals  History of TIA: Patient with history of TIA without any residual deficits. -Continue Plavix and aspirin  Hemorrhoids: Followed by Dr. Carlean Purl in the outpatient setting. -Continue suppository    History of meningioma: CT scan of the brain from 8/20 noted stable size of meningioma.  Glaucoma -Continue Latanoprost   DVT prophylaxis: Lovenox Code Status: Full Family Communication: Discussed plan of care with the patient and her daughter present at bedside Disposition Plan: To be determined Consults called: Gastroenterology Admission status: observation Norval Morton MD [facility] Pager [phone]   If 7PM-7AM, please contact night-coverage [email or web address] Password [email or web address]  12/16/2018, 1:13 PM

## 2018-12-16 NOTE — ED Notes (Addendum)
ED TO INPATIENT HANDOFF REPORT  ED Nurse Name and Phone #:   S Name/Age/Gender Sydney [name] 85 y.o. female Room/Bed: 036C/036C  Code Status   Code Status: Full Code  Home/SNF/Other Home Patient oriented to: self, place, time and situation Is this baseline? Yes   Triage Complete: Triage complete  Chief Complaint WEAKNESS  Triage Note Pt presents with EMS from home for generalized weakness x4 days. Seen in ED recently 8/18 for hypotension and 8/20 for weakness, discharged home from ED. Today, has new R upper leg pain and weakness that prevents her from ambulating independently (usually does not require assistive device, but now needs daughter's assistance), pain made worse by movement and palpation.  Called PCP, was told to return to ED.  Liver enzymes elevated at last PCP visit.    Allergies Allergies  Allergen Reactions  . Demerol [Meperidine] Other (See Comments)    Excessive sweating, cramping  . Other     No Antibiotic without consultation with her primary physician.  . Codeine Rash  . Sulfa Antibiotics Itching and Other (See Comments)    Reaction unknown  . Tomato Itching    Level of Care/Admitting Diagnosis ED Disposition    ED Disposition Condition Comment   Admit  Hospital Area: [facility] [[id number]]  Level of Care: Telemetry Medical [104]  I expect the patient will be discharged within 24 hours: No (not a candidate for 5C-Observation unit)  Covid Evaluation: Confirmed COVID Negative  Diagnosis: Generalized weakness [id number]  Admitting Physician: Norval Morton [id number]  Attending Physician: Norval Morton [id number]  PT Class (Do Not Modify): Observation [104]  PT Acc Code (Do Not Modify): Observation [10022]       B Medical/Surgery History Past Medical History:  Diagnosis Date  . Anemia    years ago  . Arthritis    "left hand" (02/17/2013)  . C. difficile diarrhea   . Glaucoma   . High cholesterol    "on RX years  ago" (02/17/2013)  . Hypertension   . Meningioma ([place]) 05/01/2015  . Osteoporosis 02/2014   T score -2.5  followed by Dr. Lysle Rubens  . Palpitations    PACs, PVCs and short runs of atrial tachycardia on Holter monitoring  . TIA (transient ischemic attack) 1990's   Past Surgical History:  Procedure Laterality Date  . ANAL FISTULECTOMY  1970  . COLONOSCOPY    . COLONOSCOPY WITH PROPOFOL N/A 12/02/2016   Procedure: COLONOSCOPY WITH PROPOFOL;  Surgeon: Gatha Mayer, MD;  Location: [facility] ENDOSCOPY;  Service: Endoscopy;  Laterality: N/A;  . 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 TONSILLECTOMY       A IV Location/Drains/Wounds Patient Lines/Drains/Airways Status   Active Line/Drains/Airways    Name:   Placement date:   Placement time:   Site:   Days:   Peripheral IV 12/16/18 Posterior;Right Forearm   12/16/18    1141    Forearm   less than 1   Pressure Injury 12/16/18 Coccyx Mid Stage II -  Partial thickness loss of dermis presenting as a shallow open ulcer with a red, pink wound bed without slough.   12/16/18    1123     less than 1          Intake/Output Last 24 hours No intake or output data in the 24 hours ending 12/16/18 1530  Labs/Imaging Results for  orders placed or performed during the hospital encounter of 12/16/18 (from the past 48 hour(s))  CBC with Differential     Status: Abnormal   Collection Time: 12/16/18 11:15 AM  Result Value Ref Range   WBC 3.4 (L) 4.0 - 10.5 K/uL   RBC 3.36 (L) 3.87 - 5.11 MIL/uL   Hemoglobin 10.7 (L) 12.0 - 15.0 g/dL   HCT 32.2 (L) 36.0 - 46.0 %   MCV 95.8 80.0 - 100.0 fL   MCH 31.8 26.0 - 34.0 pg   MCHC 33.2 30.0 - 36.0 g/dL   RDW 14.7 11.5 - 15.5 %   Platelets 127 (L) 150 - 400 K/uL   nRBC 0.6 (H) 0.0 - 0.2 %   Neutrophils Relative % 65 %   Neutro Abs 2.3 1.7 - 7.7 K/uL   Lymphocytes Relative 18 %   Lymphs Abs 0.6 (L) 0.7  - 4.0 K/uL   Monocytes Relative 12 %   Monocytes Absolute 0.4 0.1 - 1.0 K/uL   Eosinophils Relative 4 %   Eosinophils Absolute 0.1 0.0 - 0.5 K/uL   Basophils Relative 0 %   Basophils Absolute 0.0 0.0 - 0.1 K/uL   Immature Granulocytes 1 %   Abs Immature Granulocytes 0.03 0.00 - 0.07 K/uL    Comment: Performed at [facility], [address], [place], [place] [address]  Comprehensive metabolic panel     Status: Abnormal   Collection Time: 12/16/18 11:15 AM  Result Value Ref Range   Sodium 131 (L) 135 - 145 mmol/L   Potassium 4.3 3.5 - 5.1 mmol/L    Comment: SLIGHT HEMOLYSIS   Chloride 110 98 - 111 mmol/L   CO2 16 (L) 22 - 32 mmol/L   Glucose, Bld 88 70 - 99 mg/dL   BUN 13 8 - 23 mg/dL   Creatinine, Ser 0.86 0.44 - 1.00 mg/dL   Calcium 8.2 (L) 8.9 - 10.3 mg/dL   Total Protein 5.5 (L) 6.5 - 8.1 g/dL   Albumin 2.3 (L) 3.5 - 5.0 g/dL   AST 146 (H) 15 - 41 U/L   ALT 74 (H) 0 - 44 U/L   Alkaline Phosphatase 78 38 - 126 U/L   Total Bilirubin 1.1 0.3 - 1.2 mg/dL   GFR calc non Af Amer >60 >60 mL/min   GFR calc Af Amer >60 >60 mL/min   Anion gap 5 5 - 15    Comment: Performed at [facility] [address] [place], [place] [address]  Troponin I (High Sensitivity)     Status: None   Collection Time: 12/16/18 11:15 AM  Result Value Ref Range   Troponin I (High Sensitivity) 3 <18 ng/L    Comment: (NOTE) Elevated high sensitivity troponin I (hsTnI) values and significant  changes across serial measurements may suggest ACS but many other  chronic and acute conditions are known to elevate hsTnI results.  Refer to the Links section for chest pain algorithms and additional  guidance. Performed at [facility], [place] [address] [place], [place] [address]   CK     Status: None   Collection Time: 12/16/18 11:15 AM  Result Value Ref Range   Total CK 140 38 - 234 U/L    Comment: Performed at [facility], [place] [address] [place], [place] [address]  Urinalysis,  Routine w reflex microscopic     Status: Abnormal   Collection Time: 12/16/18 11:16 AM  Result Value Ref Range   Color,  Urine AMBER (A) YELLOW    Comment: BIOCHEMICALS MAY BE AFFECTED BY COLOR   APPearance CLOUDY (A) CLEAR   Specific Gravity, Urine 1.003 (L) 1.005 - 1.030   pH 8.0 5.0 - 8.0   Glucose, UA NEGATIVE NEGATIVE mg/dL   Hgb urine dipstick MODERATE (A) NEGATIVE   Bilirubin Urine NEGATIVE NEGATIVE   Ketones, ur NEGATIVE NEGATIVE mg/dL   Protein, ur 30 (A) NEGATIVE mg/dL   Nitrite NEGATIVE NEGATIVE   Leukocytes,Ua LARGE (A) NEGATIVE   RBC / HPF 0-5 0 - 5 RBC/hpf   WBC, UA 6-10 0 - 5 WBC/hpf   Bacteria, UA MANY (A) NONE SEEN   Squamous Epithelial / LPF 0-5 0 - 5   Mucus PRESENT    Budding Yeast PRESENT     Comment: Performed at [facility] [address], [place], [place] [address]  Troponin I (High Sensitivity)     Status: None   Collection Time: 12/16/18  1:16 PM  Result Value Ref Range   Troponin I (High Sensitivity) <2 <18 ng/L    Comment: Performed at [facility] [address] [place], [place] [address]  Protime-INR     Status: None   Collection Time: 12/16/18  1:16 PM  Result Value Ref Range   Prothrombin Time 13.6 11.4 - 15.2 seconds   INR 1.1 0.8 - 1.2    Comment: (NOTE) INR goal varies based on device and disease states. Performed at [facility], [place] [address] [place], [place] [address]    Ct Head Wo Contrast  Result Date: 12/15/2018 CLINICAL DATA:  Intermittent weakness for the past 2 days. Known left-sided meningioma. EXAM: CT HEAD WITHOUT CONTRAST TECHNIQUE: Contiguous axial images were obtained from the base of the skull through the vertex without intravenous contrast. COMPARISON:  Head CT dated 10/02/2017 and brain MR dated 10/02/2017. FINDINGS: Brain: Mild-to-moderate patchy white matter low density in both cerebral hemispheres without significant change. Stable small left parafalcine meningioma. No intracranial hemorrhage, new  mass lesion or CT evidence of acute infarction. Vascular: No hyperdense vessel or unexpected calcification. Skull: Normal. Negative for fracture or focal lesion. Sinuses/Orbits: Unremarkable. Other: None. IMPRESSION: 1. No acute abnormality. 2. Stable mild-to-moderate atrophy and chronic small vessel white matter ischemic changes in both cerebral hemispheres. 3. Stable small left parafalcine meningioma. Electronically Signed   By: Claudie Revering M.D.   On: 12/15/2018 14:16   Dg Chest Portable 1 View  Result Date: 12/16/2018 CLINICAL DATA:  Shortness of breath EXAM: PORTABLE CHEST 1 VIEW COMPARISON:  12/13/2018 FINDINGS: Cardiac shadow is stable. Aortic calcifications are again seen. The lungs are well aerated bilaterally. No focal infiltrate or sizable effusion is seen. Skin folds are noted over the left chest. No bony abnormality is seen. IMPRESSION: No acute abnormality noted. Electronically Signed   By: Inez Catalina M.D.   On: 12/16/2018 12:09   Dg Hip Unilat W Or Wo Pelvis 1 View Right  Result Date: 12/16/2018 CLINICAL DATA:  Fall,,confused ,,,,rt hip tenderness EXAM: DG HIP (WITH OR WITHOUT PELVIS) 1V RIGHT COMPARISON:  None. FINDINGS: No acute fracture or subluxation there is atherosclerotic calcification of the femoral arteries bilaterally. Bowel gas pattern is nonobstructive. IMPRESSION: No evidence for acute abnormality. Electronically Signed   By: Nolon Nations M.D.   On: 12/16/2018 12:58    Pending Labs Unresulted Labs (From admission, onward)    Start     Ordered   12/17/18 0500  Comprehensive metabolic panel  Tomorrow morning,  R     12/16/18 1337   12/17/18 0500  CBC  Tomorrow morning,   R     12/16/18 1337   12/16/18 1118  Hepatitis panel, acute  ONCE - STAT,   STAT     12/16/18 1117          Vitals/Pain Today's Vitals   12/16/18 1312 12/16/18 1315 12/16/18 1319 12/16/18 1321  BP:  (!) 111/51  (!) 117/51  Pulse: (!) 55 (!) 53    Resp: 17 17  (!) 22  Temp:       TempSrc:      SpO2: 100% 97%  99%  Weight:      Height:      PainSc:   0-No pain     Isolation Precautions No active isolations  Medications Medications  aspirin EC tablet 81 mg (has no administration in time range)  pantoprazole (PROTONIX) EC tablet 20 mg (has no administration in time range)  clopidogrel (PLAVIX) tablet 75 mg (has no administration in time range)  folic acid (FOLVITE) tablet 1 mg (has no administration in time range)  loratadine (CLARITIN) tablet 10 mg (has no administration in time range)  fluticasone (FLONASE) 50 MCG/ACT nasal spray 2 spray (has no administration in time range)  latanoprost (XALATAN) 0.005 % ophthalmic solution 1 drop (has no administration in time range)  hydrocortisone (ANUSOL-HC) suppository 25 mg (has no administration in time range)  enoxaparin (LOVENOX) injection 30 mg (has no administration in time range)  sodium chloride flush (NS) 0.9 % injection 3 mL (3 mLs Intravenous Not Given 12/16/18 1523)  ondansetron (ZOFRAN) tablet 4 mg (has no administration in time range)    Or  ondansetron (ZOFRAN) injection 4 mg (has no administration in time range)  0.9 %  sodium chloride infusion ( Intravenous New Bag/Given 12/16/18 1323)    Mobility walks High fall risk   Focused Assessments Cardiac Assessment Handoff:    Lab Results  Component Value Date   CKTOTAL 140 12/16/2018   TROPONINI <0.30 02/17/2013   No results found for: DDIMER Does the Patient currently have chest pain? No     R Recommendations: See Admitting Provider Note  Report given to:   Additional Notes:  Mildly abnormal rising transaminases.  Anorexia, reduced p.o. intake, weakness.  No abdominal pain.  Acute hepatitis panel is pending.  Pancytopenia.  Mild.  Hyponatremia.  Daughter reports this is chronic.  She had sodiums in the 130s in 09/2016, they were in the 130s to 140s in 2019 and as recently as June.

## 2018-12-16 NOTE — ED Notes (Signed)
ED Provider at bedside. 

## 2018-12-16 NOTE — Telephone Encounter (Signed)
Patient has been admitted.

## 2018-12-16 NOTE — ED Notes (Signed)
Pt daughter now at bedside, states that pt did fall at home due to this weakness last night, "sat down" on R side, only known injury to daughter is R pinkie toes but adds "maybe that's why her right leg hurts"

## 2018-12-16 NOTE — Consult Note (Addendum)
[facility] Consult: 12:54 PM 12/16/2018  LOS: 0 days    Referring Provider: Dr Billy Fischer in ED  Primary Care Physician:  [name], MD Primary Gastroenterologist:  Dr. Carlean Purl, previously [facility].    Reason for Consultation:  Weakness, rising LFTs.     HPI: Sydney [name] is a 85 y.o. female.  Chronic hyponatremia.  Chronic Plavix, low-dose aspirin for history TIA. 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  Limited views but entire examined colon normal.  FMT transplant performed in the cecum. CT of the pelvis in January 2020 showed benign, stable degeneration in left iliac crest.  Calcified uterine fibroids, bilateral inguinal hernias containing loops of bowel without strangulation.  Aortic atherosclerosis.  12/09/2018 office visit with Dr. Havery Moros regarding minor rectal bleeding.  Complaint of fatigue.  At endoscopy she had inflamed internal hemorrhoids which she felt was the source of bleeding.  Daily fiber supplements to keep stools soft recommended and Anusol suppository prescribed.  He ordered .  Hgb was 10.9, sodium 130.  LFTs not obtained. ED visit 8/18 for evaluation of the weakness, fatigue, anorexia, poor p.o. intake present for at least 2 weeks.  Weight reported as 100#, down from baseline of 103-104.  Labs again revealed low sodium at 130.  Bicarb low at 15.  Leukopenia at 3.2.  Hgb 10.8.  She felt better after receiving a 1 L bolus of normal saline.  Declined suggested for admission.  Discharged and advised to follow-up with PMD. Communicated with Dr. Carlean Purl yesterday.    As recommended, seen by PMD yesterday.  COVID testing yesterday and again today negative.  [place] with Dr. Havery Moros overnight he placed future orders for hepatitis panel and cortisol  level.  The latter is processing, cortisol not collected. She did have some nausea/queasiness yesterday morning but otherwise has not had this as a symptom.  No abdominal pain.  Rectal bleeding has stopped.  Has not been able to make her appointment to wound care for evaluation of sacral skin breakdown.  Today her daughter caught her as she was falling, her legs were giving out from under her.  There was a soft landing.  No syncope.  In ED today.  LFTs rising Between 8/18 and 8/21 labs are as follows: T bili 0.8 >> 1.1 Alk phos 79 >> 78 AST /ALT 127/77 >> 146/74. Albumin 2.6 >> 2.3 Last previous LFTs 09/2016, normal. Sodium 130 >> 131. Anal function normal. Pancytopenia WBCs 3.4, Hgb 10.7, platelets 127.  09/2017 Hgb 11.3. TSH normal.  No EtOH.  Other than the Anusol suppositories, no new medications. She and her daughter live together.  Patient normally loves to cook and is independent of ADLs but with recent fatigue she is too tired to cook.  Past Medical History:  Diagnosis Date   Anemia    years ago   Arthritis    "left hand" (02/17/2013)   C. difficile diarrhea    Glaucoma    High cholesterol    "on RX years ago" (02/17/2013)   Hypertension  Meningioma ([place]) 05/01/2015   Osteoporosis 02/2014   T score -2.5  followed by Dr. Lysle Rubens   Palpitations    PACs, PVCs and short runs of atrial tachycardia on Holter monitoring   TIA (transient ischemic attack) 1990's    Past Surgical History:  Procedure Laterality Date   ANAL FISTULECTOMY  1970   COLONOSCOPY     COLONOSCOPY WITH PROPOFOL N/A 12/02/2016   Procedure: COLONOSCOPY WITH PROPOFOL;  Surgeon: Gatha Mayer, MD;  Location: [facility] ENDOSCOPY;  Service: Endoscopy;  Laterality: N/A;   FECAL TRANSPLANT N/A 12/02/2016   Procedure: FECAL TRANSPLANT;  Surgeon: Gatha Mayer, MD;  Location: [facility] ENDOSCOPY;  Service: Endoscopy;  Laterality: N/A;   TONSILLECTOMY      Prior to Admission medications   Medication Sig Start  Date End Date Taking? Authorizing Provider  aspirin (ECOTRIN LOW STRENGTH) 81 MG EC tablet Take 81 mg by mouth daily. Swallow whole.    [provider]  cholecalciferol (VITAMIN D) 1000 units tablet Take 1,000 Units by mouth daily.    [provider]  clopidogrel (PLAVIX) 75 MG tablet Take 75 mg by mouth daily.    [provider]  fluticasone (FLONASE) 50 MCG/ACT nasal spray Place 2 sprays into both nostrils daily as needed for allergies or rhinitis. 10/06/16   Hongalgi, Lenis Dickinson, MD  folic acid (FOLVITE) 1 MG tablet Take 1 mg by mouth daily.    [provider]  hydrocortisone (ANUSOL-HC) 25 MG suppository Place 1 suppository (25 mg total) rectally at bedtime. Use for 7 days and then as needed, thereafter 12/09/18   Armbruster, Carlota Raspberry, MD  latanoprost (XALATAN) 0.005 % ophthalmic solution Place 1 drop into both eyes at bedtime.     [provider]  loratadine (CLARITIN) 10 MG tablet Take 1 tablet (10 mg total) by mouth daily. Patient taking differently: Take 10 mg by mouth daily as needed for allergies or rhinitis.  10/05/17   Thurnell Lose, MD  pantoprazole (PROTONIX) 20 MG tablet Take 20 mg by mouth daily. 12/09/18   [provider]    Scheduled Meds:  Infusions:  sodium chloride     PRN Meds:    Allergies as of 12/16/2018 - Review Complete 12/16/2018  Allergen Reaction Noted   Demerol [meperidine] Other (See Comments) 12/06/2012   Other  10/02/2017   Codeine Rash 12/06/2012   Sulfa antibiotics Itching and Other (See Comments) 12/06/2012   Tomato Itching 04/03/2015    Family History  Problem Relation Age of Onset   Hypertension Father    Heart attack Father    Stroke Brother    Stroke Maternal Grandmother     Social History   Socioeconomic History   Marital status: Widowed    Spouse name: Not on file   Number of children: 1   Years of education: 26   Highest education level: Not on file  Occupational  History   Occupation: retired  Scientist, product/process development strain: Not on file   Food insecurity    Worry: Not on file    Inability: Not on Lexicographer needs    Medical: Not on file    Non-medical: Not on file  Tobacco Use   Smoking status: Never Smoker   Smokeless tobacco: Never Used  Substance and Sexual Activity   Alcohol use: No   Drug use: No   Sexual activity: Never    Comment: 1st intercourse 85 yo-Fewer than 5 partners  Lifestyle  Physical activity    Days per week: Not on file    Minutes per session: Not on file   Stress: Not on file  Relationships   Social connections    Talks on phone: Not on file    Gets together: Not on file    Attends religious service: Not on file    Active member of club or organization: Not on file    Attends meetings of clubs or organizations: Not on file    Relationship status: Not on file   Intimate partner violence    Fear of current or ex partner: Not on file    Emotionally abused: Not on file    Physically abused: Not on file    Forced sexual activity: Not on file  Other Topics Concern   Not on file  Social History Narrative   Patient does not drink caffeine.   Patient is right handed.     REVIEW OF SYSTEMS: Constitutional: Per HPI. ENT:  No nose bleeds Pulm: Shortness of breath CV:  No palpitations, no LE edema.  GU:  No hematuria, no frequency GI: See HPI.  No dysphagia.  No abdominal pain. Heme: Denies unusual or excessive bleeding. Transfusions: None. Neuro:  No headaches, no peripheral tingling or numbness.  Syncope.  No seizures. Derm:  No itching, no rash.  She complains of a sore on her right fifth toe. Endocrine:  No sweats or chills.  No polyuria or dysuria Immunization: Not queried. Travel:  None beyond local counties in last few months.    PHYSICAL EXAM: Vital signs in last 24 hours: Vitals:   12/16/18 1215 12/16/18 1230  BP: (!) 125/58 125/67  Pulse:    Resp: 18 19    Temp:    SpO2:     Wt Readings from Last 3 Encounters:  12/16/18 45.5 kg  12/09/18 45.5 kg  04/12/18 46.3 kg    General: Thin, pleasant, quiet, comfortable, frail but not ill looking Head: No facial asymmetry or swelling.  No signs of head trauma. Eyes: No scleral icterus.  No conjunctival pallor.  EOMI. Ears: Slightly hard of hearing. Nose: No discharge or congestion Mouth: Moist, pink, clear oral mucosa.  Tongue midline. Neck: No JVD, no masses, no thyromegaly Lungs: Clear bilaterally.  No labored breathing or cough Heart: RRR.  No MRG.  S1, S2 present Abdomen: Soft.  No masses, no HSM, no tenderness, no bruits, no hernias.  Bowel sounds normal..   Rectal: Reddened hemorrhoids visible.  Sacral area covered with protective pressure dressing.  No visible blood.  Did not perform DRE Musc/Skeltl: No joint redness, swelling or gross deformity. Extremities: No CCE. Neurologic: Alert.  Oriented x3.  Moves all 4 limbs.  No resting tremor.  As she tried to pull herself up in bed there was a little bit of tremor in her arms. Skin: No rash, no sores, no suspicious lesions.  Sacral skin breakdown covered. Tattoos: No CCE. Nodes: No cervical adenopathy. Psych: Affect flat, cooperative, calm.  Intake/Output from previous day: No intake/output data recorded. Intake/Output this shift: No intake/output data recorded.  LAB RESULTS: Recent Labs    12/15/18 0811 12/16/18 1115  WBC 3.3* 3.4*  HGB 10.9* 10.7*  HCT 32.8* 32.2*  PLT 148* 127*   BMET Lab Results  Component Value Date   NA 131 (L) 12/16/2018   NA 133 (L) 12/15/2018   NA 130 (L) 12/13/2018   K 4.3 12/16/2018   K 3.8 12/15/2018   K 3.5 12/13/2018  CL 110 12/16/2018   CL 109 12/15/2018   CL 107 12/13/2018   CO2 16 (L) 12/16/2018   CO2 17 (L) 12/15/2018   CO2 15 (L) 12/13/2018   GLUCOSE 88 12/16/2018   GLUCOSE 86 12/15/2018   GLUCOSE 90 12/13/2018   BUN 13 12/16/2018   BUN 15 12/15/2018   BUN 16 12/13/2018    CREATININE 0.86 12/16/2018   CREATININE 0.85 12/15/2018   CREATININE 0.88 12/13/2018   CALCIUM 8.2 (L) 12/16/2018   CALCIUM 8.4 (L) 12/15/2018   CALCIUM 8.4 (L) 12/13/2018   LFT Recent Labs    12/15/18 0811 12/16/18 1115  PROT 5.9* 5.5*  ALBUMIN 2.5* 2.3*  AST 137* 146*  ALT 77* 74*  ALKPHOS 82 78  BILITOT 0.8 1.1   PT/INR Lab Results  Component Value Date   INR 1.14 04/08/2015   Hepatitis Panel No results for input(s): HEPBSAG, HCVAB, HEPAIGM, HEPBIGM in the last 72 hours. C-Diff No components found for: CDIFF Lipase     Component Value Date/Time   LIPASE 44 10/05/2016 0935    Drugs of Abuse  No results found for: LABOPIA, COCAINSCRNUR, LABBENZ, AMPHETMU, THCU, LABBARB   RADIOLOGY STUDIES: Ct Head Wo Contrast  Result Date: 12/15/2018 CLINICAL DATA:  Intermittent weakness for the past 2 days. Known left-sided meningioma. EXAM: CT HEAD WITHOUT CONTRAST TECHNIQUE: Contiguous axial images were obtained from the base of the skull through the vertex without intravenous contrast. COMPARISON:  Head CT dated 10/02/2017 and brain MR dated 10/02/2017. FINDINGS: Brain: Mild-to-moderate patchy white matter low density in both cerebral hemispheres without significant change. Stable small left parafalcine meningioma. No intracranial hemorrhage, new mass lesion or CT evidence of acute infarction. Vascular: No hyperdense vessel or unexpected calcification. Skull: Normal. Negative for fracture or focal lesion. Sinuses/Orbits: Unremarkable. Other: None. IMPRESSION: 1. No acute abnormality. 2. Stable mild-to-moderate atrophy and chronic small vessel white matter ischemic changes in both cerebral hemispheres. 3. Stable small left parafalcine meningioma. Electronically Signed   By: Claudie Revering M.D.   On: 12/15/2018 14:16   Dg Chest Portable 1 View  Result Date: 12/16/2018 CLINICAL DATA:  Shortness of breath EXAM: PORTABLE CHEST 1 VIEW COMPARISON:  12/13/2018 FINDINGS: Cardiac shadow is  stable. Aortic calcifications are again seen. The lungs are well aerated bilaterally. No focal infiltrate or sizable effusion is seen. Skin folds are noted over the left chest. No bony abnormality is seen. IMPRESSION: No acute abnormality noted. Electronically Signed   By: Inez Catalina M.D.   On: 12/16/2018 12:09     IMPRESSION:   *    Mildly abnormal rising transaminases.  Anorexia, reduced p.o. intake, weakness.  No abdominal pain.  Acute hepatitis panel is pending.  *    Pancytopenia.  Mild.  *     Hyponatremia.  Daughter reports this is chronic.  She had sodiums in the 130s in 09/2016, they were in the 130s to 140s in 2019 and as recently as June.     PLAN:     *   Per Dr. Henrene Pastor.  Azucena Freed  12/16/2018, 12:54 PM Phone [phone]  GI ATTENDING  History, x-rays, laboratories, prior endoscopy reports reviewed.  Agree with comprehensive consultation note as outlined above.  Patient presents with nonspecific symptoms of weakness and anorexia.  No specific GI complaints.  Mildly abnormal liver tests without evidence of impaired hepatic function.  This is very nonspecific.  Agree with serologic testing as ordered.  Most likely general medical illness, possibly viral illness  or metabolic.  Supportive care, correction of electrolyte abnormalities, and follow-up previously ordered hepatitis panel.  Nothing further to add from GI perspective quite honestly.  If specific issues come up or specific questions, please call.  Dr. Collene Mares available for GI this weekend.  Docia Chuck. Geri Seminole., M.D. [facility] Division of Gastroenterology

## 2018-12-16 NOTE — Telephone Encounter (Signed)
Thank you steve  Vedia Coffer daughter  I think she needs to see PCP soon  - today given clinical scenario, or f/u [facility] today if that is not possible  If LFT abnormalities need more w/u we can certainly help with that  Possibilities are broad as far as a cause - LFT changes could be non-specific  ED suggested admission and they declined

## 2018-12-16 NOTE — Telephone Encounter (Signed)
Pt's daughter reported that pt is back in ED.  She wanted to say "thank you for going above and beyond as a specialist to help her mother."

## 2018-12-16 NOTE — ED Notes (Signed)
Lunch Tray Ordered @ 1447-per RN called by [name]

## 2018-12-17 DIAGNOSIS — Z885 Allergy status to narcotic agent status: Secondary | ICD-10-CM | POA: Diagnosis not present

## 2018-12-17 DIAGNOSIS — Z7902 Long term (current) use of antithrombotics/antiplatelets: Secondary | ICD-10-CM | POA: Diagnosis not present

## 2018-12-17 DIAGNOSIS — H409 Unspecified glaucoma: Secondary | ICD-10-CM | POA: Diagnosis present

## 2018-12-17 DIAGNOSIS — E860 Dehydration: Secondary | ICD-10-CM | POA: Diagnosis present

## 2018-12-17 DIAGNOSIS — I959 Hypotension, unspecified: Secondary | ICD-10-CM | POA: Diagnosis present

## 2018-12-17 DIAGNOSIS — Y9301 Activity, walking, marching and hiking: Secondary | ICD-10-CM | POA: Diagnosis present

## 2018-12-17 DIAGNOSIS — I10 Essential (primary) hypertension: Secondary | ICD-10-CM | POA: Diagnosis present

## 2018-12-17 DIAGNOSIS — R740 Nonspecific elevation of levels of transaminase and lactic acid dehydrogenase [LDH]: Secondary | ICD-10-CM | POA: Diagnosis present

## 2018-12-17 DIAGNOSIS — L89152 Pressure ulcer of sacral region, stage 2: Secondary | ICD-10-CM | POA: Diagnosis present

## 2018-12-17 DIAGNOSIS — Z20828 Contact with and (suspected) exposure to other viral communicable diseases: Secondary | ICD-10-CM | POA: Diagnosis present

## 2018-12-17 DIAGNOSIS — R262 Difficulty in walking, not elsewhere classified: Secondary | ICD-10-CM | POA: Diagnosis present

## 2018-12-17 DIAGNOSIS — H9190 Unspecified hearing loss, unspecified ear: Secondary | ICD-10-CM | POA: Diagnosis present

## 2018-12-17 DIAGNOSIS — D61818 Other pancytopenia: Secondary | ICD-10-CM | POA: Diagnosis present

## 2018-12-17 DIAGNOSIS — R5381 Other malaise: Secondary | ICD-10-CM | POA: Diagnosis present

## 2018-12-17 DIAGNOSIS — R531 Weakness: Secondary | ICD-10-CM | POA: Diagnosis present

## 2018-12-17 DIAGNOSIS — Z8673 Personal history of transient ischemic attack (TIA), and cerebral infarction without residual deficits: Secondary | ICD-10-CM | POA: Diagnosis not present

## 2018-12-17 DIAGNOSIS — E871 Hypo-osmolality and hyponatremia: Secondary | ICD-10-CM | POA: Diagnosis present

## 2018-12-17 DIAGNOSIS — D320 Benign neoplasm of cerebral meninges: Secondary | ICD-10-CM | POA: Diagnosis present

## 2018-12-17 DIAGNOSIS — Y92009 Unspecified place in unspecified non-institutional (private) residence as the place of occurrence of the external cause: Secondary | ICD-10-CM | POA: Diagnosis not present

## 2018-12-17 DIAGNOSIS — W1839XA Other fall on same level, initial encounter: Secondary | ICD-10-CM | POA: Diagnosis present

## 2018-12-17 DIAGNOSIS — R21 Rash and other nonspecific skin eruption: Secondary | ICD-10-CM | POA: Diagnosis present

## 2018-12-17 DIAGNOSIS — E785 Hyperlipidemia, unspecified: Secondary | ICD-10-CM | POA: Diagnosis present

## 2018-12-17 DIAGNOSIS — S91119A Laceration without foreign body of unspecified toe without damage to nail, initial encounter: Secondary | ICD-10-CM | POA: Diagnosis present

## 2018-12-17 DIAGNOSIS — N390 Urinary tract infection, site not specified: Secondary | ICD-10-CM | POA: Diagnosis present

## 2018-12-17 DIAGNOSIS — E43 Unspecified severe protein-calorie malnutrition: Secondary | ICD-10-CM | POA: Diagnosis present

## 2018-12-17 DIAGNOSIS — Z681 Body mass index (BMI) 19 or less, adult: Secondary | ICD-10-CM | POA: Diagnosis not present

## 2018-12-17 DIAGNOSIS — K648 Other hemorrhoids: Secondary | ICD-10-CM | POA: Diagnosis present

## 2018-12-17 DIAGNOSIS — D649 Anemia, unspecified: Secondary | ICD-10-CM | POA: Diagnosis present

## 2018-12-17 LAB — COMPREHENSIVE METABOLIC PANEL
ALT: 75 U/L — ABNORMAL HIGH (ref 0–44)
AST: 146 U/L — ABNORMAL HIGH (ref 15–41)
Albumin: 2 g/dL — ABNORMAL LOW (ref 3.5–5.0)
Alkaline Phosphatase: 69 U/L (ref 38–126)
Anion gap: 8 (ref 5–15)
BUN: 9 mg/dL (ref 8–23)
CO2: 13 mmol/L — ABNORMAL LOW (ref 22–32)
Calcium: 7.7 mg/dL — ABNORMAL LOW (ref 8.9–10.3)
Chloride: 111 mmol/L (ref 98–111)
Creatinine, Ser: 0.74 mg/dL (ref 0.44–1.00)
GFR calc Af Amer: >60 mL/min (ref >60)
GFR calc non Af Amer: >60 mL/min (ref >60)
Glucose, Bld: 76 mg/dL (ref 70–99)
Potassium: 3.3 mmol/L — ABNORMAL LOW (ref 3.5–5.1)
Sodium: 132 mmol/L — ABNORMAL LOW (ref 135–145)
Total Bilirubin: 0.6 mg/dL (ref 0.3–1.2)
Total Protein: 4.6 g/dL — ABNORMAL LOW (ref 6.5–8.1)

## 2018-12-17 LAB — CBC
HCT: 29.7 % — ABNORMAL LOW (ref 36.0–46.0)
Hemoglobin: 10.2 g/dL — ABNORMAL LOW (ref 12.0–15.0)
MCH: 31.3 pg (ref 26.0–34.0)
MCHC: 34.3 g/dL (ref 30.0–36.0)
MCV: 91.1 fL (ref 80.0–100.0)
Platelets: 129 10*3/uL — ABNORMAL LOW (ref 150–400)
RBC: 3.26 MIL/uL — ABNORMAL LOW (ref 3.87–5.11)
RDW: 14.4 % (ref 11.5–15.5)
WBC: 2.6 10*3/uL — ABNORMAL LOW (ref 4.0–10.5)
nRBC: 0 % (ref 0.0–0.2)

## 2018-12-17 LAB — PREALBUMIN: Prealbumin: <5 mg/dL — ABNORMAL LOW (ref 18–38)

## 2018-12-17 LAB — HEPATITIS PANEL, ACUTE
HCV Ab: 0.2 s/co ratio (ref 0.0–0.9)
Hep A IgM: NEGATIVE
Hep B C IgM: NEGATIVE
Hepatitis B Surface Ag: NEGATIVE

## 2018-12-17 MED ORDER — CEPHALEXIN 500 MG PO CAPS: 500.0000 mg | ORAL_CAPSULE | Freq: Three times a day (TID) | ORAL | Status: DC

## 2018-12-17 MED ORDER — SODIUM CHLORIDE 0.9 % IV SOLN: Freq: Once | INTRAVENOUS | Status: AC

## 2018-12-17 MED ORDER — SODIUM CHLORIDE 0.9 % IV SOLN
Freq: Once | INTRAVENOUS | Status: AC
  Administered 2018-12-17: 08:00:00 via INTRAVENOUS

## 2018-12-17 MED ORDER — POTASSIUM CHLORIDE CRYS ER 20 MEQ PO TBCR
40.0000 meq | EXTENDED_RELEASE_TABLET | ORAL | Status: AC
  Administered 2018-12-17: 40 meq via ORAL
  Filled 2018-12-17: qty 2

## 2018-12-17 MED ORDER — SODIUM CHLORIDE 0.9 % IV SOLN
Freq: Once | INTRAVENOUS | Status: AC
  Administered 2018-12-17: 09:00:00 via INTRAVENOUS

## 2018-12-17 MED ORDER — CEPHALEXIN 500 MG PO CAPS
500.0000 mg | ORAL_CAPSULE | Freq: Three times a day (TID) | ORAL | Status: DC
  Filled 2018-12-17: qty 1

## 2018-12-17 NOTE — Consult Note (Signed)
[facility] Nurse wound consult note Reason for Consult: Stage 2 pressure injury (PI), POA Wound type: Pressure Pressure Injury POA: Yes Measurement:1.5cm x 1cm x 0.1cm Wound [id number] moist Drainage (amount, consistency, odor) None Periwound: intact Dressing procedure/placement/frequency:I will provide Nursing with guidance for the care of the PI to include topical care and positioning. Heels would benefit from floatation to prevent PI.  WOC nursing team will not follow, but will remain available to this patient, the nursing and medical teams.  Please re-consult if needed. Thanks, Maudie Flakes, MSN, RN, [place], Arther Abbott  Pager# [phone]

## 2018-12-17 NOTE — Progress Notes (Signed)
PROGRESS NOTE    Sydney [name]  [id number] DOB: Oct 04, 1930 DOA: 12/16/2018 PCP: [name], MD   Brief Narrative:  85 year old with a history of essential hypertension, hyperlipidemia, meningioma, TIA, glaucoma, anemia, arthritis came to the hospital for evaluation of generalized weakness.  About a week ago diagnosed of rectal bleeding for which she was given hydrocortisone suppositories.  Rectal bleeding is resolved but was brought to the hospital for generalized/progressive weakness and dehydration.  Upon admission she was noted to be septic secondary to urinary tract infection started on IV Rocephin.   Assessment & Plan:   Principal Problem:   Generalized weakness Active Problems:   Pancytopenia (HCC)   Meningioma (HCC)   Hyponatremia   Protein calorie malnutrition (HCC)   History of TIA (transient ischemic attack)   Pressure injury of skin   Transaminitis   Acute lower UTI  Generalized weakness, suspect secondary to dehydration and UTI Acute urinary tract infection with hematuria -Improving with IV fluids.  Weakness is secondary to underlying infection and dehydration -Rocephin day 2. -PT/OT- home health which will be arranged for -TSH within normal limits  Mild transaminitis -Hepatitis panel ordered, ordered by GI.  Pending.  Moderate to severe dehydration, improving Hyponatremia secondary to dehydration -Improving with fluids  Stage II sacral pressure ulcer -Routine care  Severe protein calorie malnutrition -Nutrition consultation.  Supplements.  History meningioma -CT of the head 8/20-stable in size.  History of TIA -No residual weakness.  Continue aspirin and Plavix.  Glaucoma -Continue latanoprost  DVT prophylaxis: Lovenox Code Status: Full code Family Communication: Daughter at bedside Disposition Plan: Maintain hospital stay for at least 24 hours for IV fluids and antibiotics.  Await culture data  Consultants:   GI  Procedures:    None  Antimicrobials:   Rocephin day 2   Subjective: Feels a little better this morning.  Low but stronger than yesterday.  Tolerating oral diet.  Review of Systems Otherwise negative except as per HPI, including: General: Denies fever, chills, night sweats or unintended weight loss. Resp: Denies cough, wheezing, shortness of breath. Cardiac: Denies chest pain, palpitations, orthopnea, paroxysmal nocturnal dyspnea. GI: Denies abdominal pain, nausea, vomiting, diarrhea or constipation GU: Denies dysuria, frequency, hesitancy or incontinence MS: Denies muscle aches, joint pain or swelling Neuro: Denies headache, neurologic deficits (focal weakness, numbness, tingling), abnormal gait Psych: Denies anxiety, depression, SI/HI/AVH Skin: Denies new rashes or lesions ID: Denies sick contacts, exotic exposures, travel  Objective: Vitals:   12/16/18 2241 12/17/18 0010 12/17/18 0554 12/17/18 0618  BP: (!) 125/56 120/69 (!) 99/55 (!) 97/55  Pulse: 63 60 77 76  Resp:  18 16 18   Temp: 98.2 F (36.8 C) 98.3 F (36.8 C) 99.5 F (37.5 C) 98.2 F (36.8 C)  TempSrc: Oral Oral Oral Oral  SpO2: 98% 100% 99% 98%  Weight:      Height:       No intake or output data in the 24 hours ending 12/17/18 0818 Filed Weights   12/16/18 1112  Weight: 45.5 kg    Examination:  General exam: Appears calm and comfortable, frail, bilateral temporal wasting. Respiratory system: Clear to auscultation. Respiratory effort normal. Cardiovascular system: S1 & S2 heard, RRR. No JVD, murmurs, rubs, gallops or clicks. No pedal edema. Gastrointestinal system: Abdomen is nondistended, soft and nontender. No organomegaly or masses felt. Normal bowel sounds heard. Central nervous system: Alert and oriented. No focal neurological deficits. Extremities: Symmetric 4+ x 5 power. Skin: No rashes, lesions or ulcers Psychiatry: Judgement and  insight appear normal. Mood & affect appropriate.     Data Reviewed:    CBC: Recent Labs  Lab 12/13/18 1114 12/15/18 0811 12/16/18 1115 12/17/18 0340  WBC 3.2* 3.3* 3.4* 2.6*  NEUTROABS 2.1 2.2 2.3  --   HGB 10.8* 10.9* 10.7* 10.2*  HCT 33.1* 32.8* 32.2* 29.7*  MCV 96.2 94.5 95.8 91.1  PLT 159 148* 127* [id number]*   Basic Metabolic Panel: Recent Labs  Lab 12/13/18 1114 12/15/18 0811 12/16/18 1115 12/17/18 0241  NA 130* 133* 131* 132*  K 3.5 3.8 4.3 3.3*  CL 107 109 110 111  CO2 15* 17* 16* 13*  GLUCOSE 90 86 88 76  BUN 16 15 13 9   CREATININE 0.88 0.85 0.86 0.74  CALCIUM 8.4* 8.4* 8.2* 7.7*   GFR: Estimated Creatinine Clearance: 36.3 mL/min (by C-G formula based on SCr of 0.74 mg/dL). Liver Function Tests: Recent Labs  Lab 12/13/18 1114 12/15/18 0811 12/16/18 1115 12/17/18 0241  AST 127* 137* 146* 146*  ALT 72* 77* 74* 75*  ALKPHOS 79 82 78 69  BILITOT 0.8 0.8 1.1 0.6  PROT 6.2* 5.9* 5.5* 4.6*  ALBUMIN 2.6* 2.5* 2.3* 2.0*   No results for input(s): LIPASE, AMYLASE in the last 168 hours. No results for input(s): AMMONIA in the last 168 hours. Coagulation Profile: Recent Labs  Lab 12/16/18 1316  INR 1.1   Cardiac Enzymes: Recent Labs  Lab 12/16/18 1115  CKTOTAL 140   BNP (last 3 results) No results for input(s): PROBNP in the last 8760 hours. HbA1C: No results for input(s): HGBA1C in the last 72 hours. CBG: Recent Labs  Lab 12/15/18 0718  GLUCAP 72   Lipid Profile: No results for input(s): CHOL, HDL, LDLCALC, TRIG, CHOLHDL, LDLDIRECT in the last 72 hours. Thyroid Function Tests: Recent Labs    12/15/18 1247  TSH 1.271  FREET4 0.76   Anemia Panel: No results for input(s): VITAMINB12, FOLATE, FERRITIN, TIBC, IRON, RETICCTPCT in the last 72 hours. Sepsis Labs: No results for input(s): PROCALCITON, LATICACIDVEN in the last 168 hours.  Recent Results (from the past 240 hour(s))  Urine culture     Status: Abnormal   Collection Time: 12/15/18  8:11 AM   Specimen: Urine, Random  Result Value Ref Range Status    Specimen Description URINE, RANDOM  Final   Special Requests NONE  Final   Culture (A)  Final    <10,000 COLONIES/mL INSIGNIFICANT GROWTH Performed at [facility], [address], [place], [place] [address]    Report Status 12/16/2018 FINAL  Final  SARS Coronavirus 2 [facility] order, Performed in [facility] hospital lab) Nasopharyngeal Nasopharyngeal Swab     Status: None   Collection Time: 12/15/18  8:11 AM   Specimen: Nasopharyngeal Swab  Result Value Ref Range Status   SARS Coronavirus 2 NEGATIVE NEGATIVE Final    Comment: (NOTE) If result is NEGATIVE SARS-CoV-2 target nucleic acids are NOT DETECTED. The SARS-CoV-2 RNA is generally detectable in upper and lower  respiratory specimens during the acute phase of infection. The lowest  concentration of SARS-CoV-2 viral copies this assay can detect is 250  copies / mL. A negative result does not preclude SARS-CoV-2 infection  and should not be used as the sole basis for treatment or other  patient management decisions.  A negative result may occur with  improper specimen collection / handling, submission of specimen other  than nasopharyngeal swab, presence of viral mutation(s) within the  areas targeted by this assay, and inadequate  number of viral copies  (<250 copies / mL). A negative result must be combined with clinical  observations, patient history, and epidemiological information. If result is POSITIVE SARS-CoV-2 target nucleic acids are DETECTED. The SARS-CoV-2 RNA is generally detectable in upper and lower  respiratory specimens dur ing the acute phase of infection.  Positive  results are indicative of active infection with SARS-CoV-2.  Clinical  correlation with patient history and other diagnostic information is  necessary to determine patient infection status.  Positive results do  not rule out bacterial infection or co-infection with other viruses. If result is PRESUMPTIVE POSTIVE SARS-CoV-2 nucleic acids MAY  BE PRESENT.   A presumptive positive result was obtained on the submitted specimen  and confirmed on repeat testing.  While 2019 novel coronavirus  (SARS-CoV-2) nucleic acids may be present in the submitted sample  additional confirmatory testing may be necessary for epidemiological  and / or clinical management purposes  to differentiate between  SARS-CoV-2 and other Sarbecovirus currently known to infect humans.  If clinically indicated additional testing with an alternate test  methodology [phone]) is advised. The SARS-CoV-2 RNA is generally  detectable in upper and lower respiratory sp ecimens during the acute  phase of infection. The expected result is Negative. Fact Sheet for Patients:  StrictlyIdeas.no Fact Sheet for Healthcare Providers: BankingDealers.co.za This test is not yet approved or cleared by the [place] FDA and has been authorized for detection and/or diagnosis of SARS-CoV-2 by FDA under an Emergency Use Authorization (EUA).  This EUA will remain in effect (meaning this test can be used) for the duration of the COVID-19 declaration under Section 564(b)(1) of the Act, 21 U.S.C. section 360bbb-3(b)(1), unless the authorization is terminated or revoked sooner. Performed at [facility], [place] [address], [place], [place] [address]          Radiology Studies: Ct Head Wo Contrast  Result Date: 12/15/2018 CLINICAL DATA:  Intermittent weakness for the past 2 days. Known left-sided meningioma. EXAM: CT HEAD WITHOUT CONTRAST TECHNIQUE: Contiguous axial images were obtained from the base of the skull through the vertex without intravenous contrast. COMPARISON:  Head CT dated 10/02/2017 and brain MR dated 10/02/2017. FINDINGS: Brain: Mild-to-moderate patchy white matter low density in both cerebral hemispheres without significant change. Stable small left parafalcine meningioma. No intracranial hemorrhage, new mass  lesion or CT evidence of acute infarction. Vascular: No hyperdense vessel or unexpected calcification. Skull: Normal. Negative for fracture or focal lesion. Sinuses/Orbits: Unremarkable. Other: None. IMPRESSION: 1. No acute abnormality. 2. Stable mild-to-moderate atrophy and chronic small vessel white matter ischemic changes in both cerebral hemispheres. 3. Stable small left parafalcine meningioma. Electronically Signed   By: Claudie Revering M.D.   On: 12/15/2018 14:16   Dg Chest Portable 1 View  Result Date: 12/16/2018 CLINICAL DATA:  Shortness of breath EXAM: PORTABLE CHEST 1 VIEW COMPARISON:  12/13/2018 FINDINGS: Cardiac shadow is stable. Aortic calcifications are again seen. The lungs are well aerated bilaterally. No focal infiltrate or sizable effusion is seen. Skin folds are noted over the left chest. No bony abnormality is seen. IMPRESSION: No acute abnormality noted. Electronically Signed   By: Inez Catalina M.D.   On: 12/16/2018 12:09   Dg Hip Unilat W Or Wo Pelvis 1 View Right  Result Date: 12/16/2018 CLINICAL DATA:  Fall,,confused ,,,,rt hip tenderness EXAM: DG HIP (WITH OR WITHOUT PELVIS) 1V RIGHT COMPARISON:  None. FINDINGS: No acute fracture or subluxation there is atherosclerotic calcification of the femoral arteries bilaterally.  Bowel gas pattern is nonobstructive. IMPRESSION: No evidence for acute abnormality. Electronically Signed   By: Nolon Nations M.D.   On: 12/16/2018 12:58        Scheduled Meds: . aspirin EC  81 mg Oral Daily  . clopidogrel  75 mg Oral Daily  . enoxaparin (LOVENOX) injection  30 mg Subcutaneous Q24H  . feeding supplement (ENSURE ENLIVE)  237 mL Oral BID BM  . folic acid  1 mg Oral Daily  . latanoprost  1 drop Both Eyes QHS  . pantoprazole  20 mg Oral Daily  . potassium chloride  40 mEq Oral STAT  . sodium chloride flush  3 mL Intravenous Q12H   Continuous Infusions: . cefTRIAXone (ROCEPHIN)  IV 1 g (12/16/18 2227)     LOS: 0 days   Time spent= 35  mins    Seth Friedlander Arsenio Loader, MD [facility]  If 7PM-7AM, please contact night-coverage [email or web address] 12/17/2018, 8:18 AM

## 2018-12-17 NOTE — Evaluation (Addendum)
Occupational Therapy Evaluation Patient Details Name: Sydney [name] MRN: [id number] DOB: [birth year] Today's Date: 12/17/2018    History of Present Illness Pt isa 85 y/o female with PMH of HTN, TIA< meningioma, anemia, glaucoma, arthritis, presenting with generalized weakness. CT negative for acute abnormalities. Noted recent rectal bleeding and dehydration, with 3rd ER visit in the last week. Found with UTI.    Clinical Impression   PTA patient independent with ADLs, IADLs, mobility. Admitted for above and limited by problem list below, including generalized weakness, impaired balance and decreased activity tolerance.  Patient currently requires min guard for transfers and in room mobility using RW, min guard for LB ADLs and setup for UB ADLs. VSS during session.  She will benefit from further OT services while admitted and after dc at [facility] level in order to maximize independence and safety with ADLs/IADLs in order to return to PLOF.  Will follow, with plan to review tub and toilet transfers using 3:1 next session.     Follow Up Recommendations  Home health OT;Supervision/Assistance - 24 hour    Equipment Recommendations  3 in 1 bedside commode    Recommendations for Other Services PT consult     Precautions / Restrictions Precautions Precautions: Fall Precaution Comments: up with assist, use RW Restrictions Weight Bearing Restrictions: No      Mobility Bed Mobility Overal bed mobility: (not observed, up by nursing prior to session)             General bed mobility comments: OOB upon entry   Transfers Overall transfer level: Needs assistance Equipment used: Rolling walker (2 wheeled) Transfers: Sit to/from Stand Sit to Stand: Min guard         General transfer comment: min guard for safety/balance, cueing for hand placement    Balance Overall balance assessment: Mild deficits observed, not formally tested                                          ADL either performed or assessed with clinical judgement   ADL Overall ADL's : Needs assistance/impaired     Grooming: Min guard;Standing   Upper Body Bathing: Supervision/ safety;Set up;Sitting   Lower Body Bathing: Min guard;Sit to/from stand   Upper Body Dressing : Supervision/safety;Set up;Sitting   Lower Body Dressing: Min guard;Sit to/from stand Lower Body Dressing Details (indicate cue type and reason): able to don/doff socks with increased effort  Toilet Transfer: Min guard;Ambulation;RW Toilet Transfer Details (indicate cue type and reason): simulated to/from recliner  Toileting- Clothing Manipulation and Hygiene: Min guard;Sit to/from stand       Functional mobility during ADLs: Min guard;Rolling walker General ADL Comments: pt limited by generalized weakness and balance      Vision   Vision Assessment?: No apparent visual deficits     Perception     Praxis      Pertinent Vitals/Pain Pain Assessment: No/denies pain     Hand Dominance Right   Extremity/Trunk Assessment Upper Extremity Assessment Upper Extremity Assessment: Generalized weakness   Lower Extremity Assessment Lower Extremity Assessment: Defer to PT evaluation       Communication Communication Communication: HOH   Cognition Arousal/Alertness: Awake/alert Behavior During Therapy: WFL for tasks assessed/performed Overall Cognitive Status: Within Functional Limits for tasks assessed  General Comments: pt conversant, able to provide reliable history   General Comments       Exercises     Shoulder Instructions      Home Living Family/patient expects to be discharged to:: Private residence Living Arrangements: Children(daughter) Available Help at Discharge: Family;Available 24 hours/day Type of Home: House Home Access: Stairs to enter [employer] of Steps: 4   Home Layout: One level     Bathroom Shower/Tub: Arts administrator: Standard     Home Equipment: Grab bars - tub/shower   Additional Comments: daughter is currently working from home       Prior Functioning/Environment Level of Independence: Independent        Comments: very independent with ADLs, IADLs and mobility        OT Problem List: Decreased strength;Decreased activity tolerance;Impaired balance (sitting and/or standing);Decreased knowledge of use of DME or AE;Decreased knowledge of precautions      OT Treatment/Interventions: Self-care/ADL training;DME and/or AE instruction;Balance training;Patient/family education;Therapeutic activities;Therapeutic exercise    OT Goals(Current goals can be found in the care plan section) Acute Rehab OT Goals Patient Stated Goal: to get back home, to be independent  OT Goal Formulation: With patient Time For Goal Achievement: 12/31/18 Potential to Achieve Goals: Good  OT Frequency: Min 2X/week   Barriers to D/C:            Co-evaluation              AM-PAC OT "6 Clicks" Daily Activity     Outcome Measure Help from another person eating meals?: None Help from another person taking care of personal grooming?: A Little Help from another person toileting, which includes using toliet, bedpan, or urinal?: A Little Help from another person bathing (including washing, rinsing, drying)?: A Little Help from another person to put on and taking off regular upper body clothing?: None Help from another person to put on and taking off regular lower body clothing?: A Little 6 Click Score: 20   End of Session Equipment Utilized During Treatment: Gait belt;Rolling walker Nurse Communication: Mobility status  Activity Tolerance: Patient tolerated treatment well Patient left: in chair;with call bell/phone within reach;with family/visitor present  OT Visit Diagnosis: Muscle weakness (generalized) (M62.81);Unsteadiness on feet (R26.81)                Time: [id number] OT Time  Calculation (min): 24 min Charges:  OT General Charges $OT Visit: 1 Visit OT Evaluation $OT Eval Low Complexity: 1 Low OT Treatments $Self Care/Home Management : 8-22 mins  Delight Stare, OT Acute Rehabilitation Services Pager [phone] Office [phone]    Delight Stare 12/17/2018, 12:57 PM

## 2018-12-17 NOTE — Progress Notes (Signed)
Spoke with the patients' daughter at bedside earlier in the afternoon and then later again over the phone.  She showed great concern about Abx use for her UTI given her hx of severe C diff.   I had advised the daughter at this time she need Abx and fluids but will be quickly deescalated to Keflex (start time tomorrow, already received Roc today) unless if Culture data indicates otherwise or becomes clinically necessary.   All questions answered.   Gerlean Ren MD

## 2018-12-17 NOTE — Evaluation (Signed)
Physical Therapy Evaluation Patient Details Name: Sydney [name] MRN: [id number] DOB: Jun 01, 1930 Today's Date: 12/17/2018   History of Present Illness    85 y.o. female with medical history significant of hypertension, hyperlipidemia, TIA, meningioma hyponatremia, anemia, glaucoma, and arthritis; who presents with complaints of generalized weakness. At home her daughter states that she was requiring significant assistance to stand which was unusual.  Patient reports that it feels like her legs just cannot hold of her weight.  Associated symptoms include some nausea and decreased p.o. intake.    Clinical Impression  Pt presents with mild limitations to function compared to baseline due to weakness from illness limiting ability to walk unassisted or unaided.  Will benefit from HHPT to address deficits upon return home, and from nursing assist with mobility needs while in hospital.  Recommend RW in addition to [facility].  Pt's daughter is working from home and will be able to provide necessary help and supervision.  No further PT needs in acute setting, please encourage pt to be up with nursing.     Follow Up Recommendations Home health PT;Supervision/Assistance - 24 hour    Equipment Recommendations  Rolling walker with 5" wheels    Recommendations for Other Services       Precautions / Restrictions Precautions Precautions: Fall Precaution Comments: up with assist, use RW      Mobility  Bed Mobility Overal bed mobility: (not observed, up by nursing prior to session)                Transfers Overall transfer level: Needs assistance Equipment used: Rolling walker (2 wheeled) Transfers: Sit to/from Stand Sit to Stand: Min assist         General transfer comment: needs assist for liftoff, and verbal cues for safest hand placement during transfer  Ambulation/Gait Ambulation/Gait assistance: Min guard Gait Distance (Feet): 20 Feet Assistive device: Rolling walker (2  wheeled) Gait Pattern/deviations: Step-through pattern;Narrow base of support;Trunk flexed;Decreased stride length     General Gait Details: restricted in space of hospital room and unaccustomed to using RW, but otherwise slow and cautious with no indication of profound leg weakness  Stairs            Wheelchair Mobility    Modified Rankin (Stroke Patients Only)       Balance Overall balance assessment: Mild deficits observed, not formally tested                                           Pertinent Vitals/Pain Pain Assessment: No/denies pain    Home Living Family/patient expects to be discharged to:: Private residence Living Arrangements: Children(daughter) Available Help at Discharge: Family;Available 24 hours/day Type of Home: House Home Access: Stairs to enter   [employer] of Steps: 4 Home Layout: One level Home Equipment: Grab bars - tub/shower Additional Comments: daughter is currently working from home     Prior Function Level of Independence: Independent         Comments: very independent with ADLs, IADLs and mobility     Hand Dominance   Dominant Hand: Right    Extremity/Trunk Assessment   Upper Extremity Assessment Upper Extremity Assessment: Defer to OT evaluation;Generalized weakness    Lower Extremity Assessment Lower Extremity Assessment: Overall WFL for tasks assessed;Generalized weakness       Communication   Communication: HOH  Cognition Arousal/Alertness: Awake/alert Behavior During Therapy: [facility]  for tasks assessed/performed Overall Cognitive Status: Within Functional Limits for tasks assessed                                 General Comments: pt conversant, able to provide reliable history      General Comments      Exercises     Assessment/Plan    PT Assessment All further PT needs can be met in the next venue of care  PT Problem List Decreased strength;Decreased activity  tolerance;Decreased balance;Decreased mobility;Decreased knowledge of use of DME       PT Treatment Interventions      PT Goals (Current goals can be found in the Care Plan section)  Acute Rehab PT Goals Patient Stated Goal: home, stronger PT Goal Formulation: All assessment and education complete, DC therapy    Frequency     Barriers to discharge        Co-evaluation               AM-PAC PT "6 Clicks" Mobility  Outcome Measure Help needed turning from your back to your side while in a flat bed without using bedrails?: A Little Help needed moving from lying on your back to sitting on the side of a flat bed without using bedrails?: A Little Help needed moving to and from a bed to a chair (including a wheelchair)?: A Little Help needed standing up from a chair using your arms (e.g., wheelchair or bedside chair)?: A Little Help needed to walk in hospital room?: A Little Help needed climbing 3-5 steps with a railing? : A Little 6 Click Score: 18    End of Session Equipment Utilized During Treatment: Gait belt Activity Tolerance: Patient tolerated treatment well Patient left: in chair;with call bell/phone within reach;with chair alarm set;with nursing/sitter in room Nurse Communication: Mobility status PT Visit Diagnosis: Muscle weakness (generalized) (M62.81);Difficulty in walking, not elsewhere classified (R26.2)    Time: 3893-7342 PT Time Calculation (min) (ACUTE ONLY): 26 min   Charges:   PT Evaluation $PT Eval Low Complexity: 1 Low PT Treatments $Gait Training: 8-22 mins        Kearney Hard, PT, DPT, MS Board Certified Geriatric Clinical Specialist  Herbie Drape 12/17/2018, 11:36 AM

## 2018-12-18 LAB — BASIC METABOLIC PANEL
Anion gap: 5 (ref 5–15)
BUN: 9 mg/dL (ref 8–23)
CO2: 16 mmol/L — ABNORMAL LOW (ref 22–32)
Calcium: 8.1 mg/dL — ABNORMAL LOW (ref 8.9–10.3)
Chloride: 111 mmol/L (ref 98–111)
Creatinine, Ser: 0.86 mg/dL (ref 0.44–1.00)
GFR calc Af Amer: >60 mL/min (ref >60)
GFR calc non Af Amer: >60 mL/min (ref >60)
Glucose, Bld: 69 mg/dL — ABNORMAL LOW (ref 70–99)
Potassium: 3.7 mmol/L (ref 3.5–5.1)
Sodium: 132 mmol/L — ABNORMAL LOW (ref 135–145)

## 2018-12-18 LAB — CBC
HCT: 28.7 % — ABNORMAL LOW (ref 36.0–46.0)
Hemoglobin: 9.7 g/dL — ABNORMAL LOW (ref 12.0–15.0)
MCH: 31.3 pg (ref 26.0–34.0)
MCHC: 33.8 g/dL (ref 30.0–36.0)
MCV: 92.6 fL (ref 80.0–100.0)
Platelets: 120 10*3/uL — ABNORMAL LOW (ref 150–400)
RBC: 3.1 MIL/uL — ABNORMAL LOW (ref 3.87–5.11)
RDW: 14.7 % (ref 11.5–15.5)
WBC: 2.7 10*3/uL — ABNORMAL LOW (ref 4.0–10.5)
nRBC: 0 % (ref 0.0–0.2)

## 2018-12-18 LAB — MAGNESIUM: Magnesium: 1.7 mg/dL (ref 1.7–2.4)

## 2018-12-18 MED ORDER — CEPHALEXIN 500 MG PO CAPS: 500.0000 mg | ORAL_CAPSULE | Freq: Three times a day (TID) | ORAL | 0 refills | Status: DC

## 2018-12-18 NOTE — TOC Transition Note (Signed)
Transition of Care [facility]) - CM/SW Discharge Note   Patient Details  Name: [name] MRN: [id number] Date of Birth: 1930-06-20  Transition of Care [facility]) CM/SW Contact:  Carles Collet, RN Phone Number: 12/18/2018, 10:47 AM   Clinical Narrative:   Discussed Dc with daughter and patient. Plan to DC to home w [facility] services through [facility]. [place]; accepted. Patient will have 3/1 RW delivered to room prior to DC. OK to Dc after DME delivered.     Final next level of care: [place] Barriers to Discharge: No Barriers Identified   Patient Goals and CMS Choice Patient states their goals for this hospitalization and ongoing recovery are:: to go home CMS Medicare.gov Compare Post Acute Care list provided to:: Patient Represenative (must comment) Choice offered to / list presented to : Adult Children  Discharge Placement                       Discharge Plan and Services                DME Arranged: 3-N-1, [place] rolling DME Agency: [employer] Date DME Agency Contacted: 12/18/18 Time DME Agency Contacted: [id number] Representative spoke with at DME Agency: [place]: RN, PT, OT [place] Agency: [place] ([place]) Date [place]: 12/18/18 Time [place]: [id number] Representative spoke with at [place]: dan  Social Determinants of Health ([place]) Interventions     Readmission Risk Interventions No flowsheet data found.

## 2018-12-18 NOTE — Progress Notes (Signed)
Gay Filler to be D/C'd Home per MD order.  Discussed with the patient and all questions fully answered.  VSS, Skin clean, dry and intact without evidence of skin break down, no evidence of skin tears noted. IV catheter discontinued intact. Site without signs and symptoms of complications. Dressing and pressure applied.  An After Visit Summary was printed and given to the patient. Patient received prescription.  D/c education completed with patient/family including follow up instructions, medication list, d/c activities limitations if indicated, with other d/c instructions as indicated by MD - patient able to verbalize understanding, all questions fully answered.   Patient instructed to return to ED, call 911, or call MD for any changes in condition.   Patient escorted via [place], and D/C home via private auto.   Lorenza [facility] 12/18/2018 12:59 PM

## 2018-12-18 NOTE — Discharge Summary (Addendum)
Physician Discharge Summary  ANUHYA SURGEON [id number] DOB: 03/16/1931 DOA: 12/16/2018  PCP: [name], MD  Admit date: 12/16/2018 Discharge date: 12/18/2018  Admitted From: Home Disposition:  Home with [facility]  Recommendations for Outpatient Follow-up:  1. Follow up with PCP in 1-2 weeks 2. Please obtain BMP/CBC in one week your next doctors visit.  3. 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  About a week ago diagnosed of rectal bleeding for which she was given hydrocortisone suppositories.  Rectal bleeding is resolved but was brought to the hospital for generalized/progressive weakness and dehydration.  Upon admission she was noted to be septic secondary to urinary tract infection started on IV Rocephin.  She was also noted to have sacral decubitus ulcer-stage I.  Daughter was very hesitant for any sort of aggressive or strong antibiotic treatment for UTI given previous history of severe C. difficile requiring fecal transplant.  She tells me she was in touch with her outpatient infectious disease and gastroenterology provider regarding this. She also developed slight erythematous rash on her back which I suspect likely contact dermatitis from hospital bed/sheets with no evidence of obvious infection was noted of her sacral pressure ulcer.  Advise routine care, offloading.  Spoke extensively with the patient's daughter over the phone as well on the day of discharge.   Discharge Diagnoses:  Principal Problem:   Generalized weakness Active Problems:   Pancytopenia ([place])   Meningioma (HCC)   Hyponatremia   Protein calorie malnutrition (HCC)   History of  TIA (transient ischemic attack)   Pressure injury of skin   Transaminitis   Acute lower UTI   General weakness  Generalized weakness, suspect secondary to dehydration and UTI Acute urinary tract infection with hematuria -Improved with fluids.  Daughter very hesitant for any sort of prolonged antibiotic treatment.  She is interested in outpatient infectious disease provider because of severe C. difficile history. -Rocephin day 2.  1 more day of outpatient Keflex. Ucx pending at the time of discharge. -PT/OT- home health which will be arranged for -TSH within normal limits  Mild transaminitis -Hepatitis panel ordered, ordered by GI.  Pending at the time of discharge, should be followed up outpatient by their service  Moderate to severe dehydration, improving Hyponatremia secondary to dehydration -Resolved  Stage II sacral pressure ulcer -Routine care, advised offloading  Severe protein calorie malnutrition -Nutrition consultation.  Supplements.  Severely low prealbumin.  Encourage as much oral intake as possible  History meningioma -CT of the head 8/20-stable in size.  History of TIA -No residual weakness.  Continue aspirin and Plavix.  Glaucoma -Continue latanoprost  Erythematous rash on her back -Suspect contact dermatitis.  No evidence of infection  Consultations:  None  Subjective: Feels better, does not have any complaints.  Ambulating with the use of walker.  Reported some erythematous rash all over her back without any itching.  No obvious infection noted  Discharge Exam: Vitals:   12/18/18 0604 12/18/18 0611  BP: (!) 90/53   Pulse: 76   Resp: 16   Temp: 98.3 F (36.8 C) 99.4 F (37.4 C)  SpO2: 100%    Vitals:   12/18/18 0500 12/18/18 0600 12/18/18 0604 12/18/18 0611  BP:   (!) 90/53  Pulse:   76   Resp: 18 (!) 23 16   Temp:   98.3 F (36.8 C) 99.4 F (37.4 C)  TempSrc:   Oral Oral  SpO2:   100%   Weight:      Height:        General:  Pt is alert, awake, not in acute distress Cardiovascular: RRR, S1/S2 +, no rubs, no gallops Respiratory: CTA bilaterally, no wheezing, no rhonchi Abdominal: Soft, NT, ND, bowel sounds + Extremities: no edema, no cyanosis Sacral ulcer-no evidence of infection or drainage.  Superficial sloughing, dressing in place Erythema for back only  Discharge Instructions  Discharge Instructions    Call MD for:  persistant dizziness or light-headedness   Complete by: As directed    Diet - low sodium heart healthy   Complete by: As directed    Discharge instructions   Complete by: As directed    You were cared for by a hospitalist during your hospital stay. If you have any questions about your discharge medications or the care you received while you were in the hospital after you are discharged, you can call the unit and asked to speak with the hospitalist on call if the hospitalist that took care of you is not available. Once you are discharged, your primary care physician will handle any further medical issues. Please note that NO REFILLS for any discharge medications will be authorized once you are discharged, as it is imperative that you return to your primary care physician (or establish a relationship with a primary care physician if you do not have one) for your aftercare needs so that they can reassess your need for medications and monitor your lab values.  Please request your Prim.MD to go over all Hospital Tests and Procedure/Radiological results at the follow up, please get all Hospital records sent to your Prim MD by signing hospital release before you go home.  Get CBC, CMP, 2 view Chest X ray checked  by Primary MD during your next visit or SNF MD in 5-7 days ( we routinely change or add medications that can affect your baseline labs and fluid status, therefore we recommend that you get the mentioned basic workup next visit with your PCP, your PCP may decide not to get them or add new tests based  on their clinical decision)  On your next visit with your primary care physician please Get Medicines reviewed and adjusted.  If you experience worsening of your admission symptoms, develop shortness of breath, life threatening emergency, suicidal or homicidal thoughts you must seek medical attention immediately by calling 911 or calling your MD immediately  if symptoms less severe.  You Must read complete instructions/literature along with all the possible adverse reactions/side effects for all the Medicines you take and that have been prescribed to you. Take any new Medicines after you have completely understood and accpet all the possible adverse reactions/side effects.   Do not drive, operate heavy machinery, perform activities at heights, swimming or participation in water activities or provide baby sitting services if your were admitted for syncope or siezures until you have seen by Primary MD or a Neurologist and advised to do so again.  Do not drive when taking Pain medications.   Increase activity slowly   Complete by: As directed      Allergies as of 12/18/2018      Reactions   Demerol [meperidine] Other (See Comments)   Excessive sweating, cramping   Other    No  Antibiotic without consultation with her primary physician.   Codeine Rash   Sulfa Antibiotics Itching, Other (See Comments)   Reaction unknown   Tomato Itching      Medication List    TAKE these medications   cephALEXin 500 MG capsule Commonly known as: KEFLEX Take 1 capsule (500 mg total) by mouth every 8 (eight) hours for 1 day.   cholecalciferol 1000 units tablet Commonly known as: VITAMIN D Take 1,000 Units by mouth daily.   clopidogrel 75 MG tablet Commonly known as: PLAVIX Take 75 mg by mouth daily.   Ecotrin Low Strength 81 MG EC tablet Generic drug: aspirin Take 81 mg by mouth daily. Swallow whole.   folic acid 1 MG tablet Commonly known as: FOLVITE Take 1 mg by mouth daily.   latanoprost  0.005 % ophthalmic solution Commonly known as: XALATAN Place 1 drop into both eyes at bedtime.   loratadine 10 MG tablet Commonly known as: CLARITIN Take 1 tablet (10 mg total) by mouth daily. What changed:   when to take this  reasons to take this   pantoprazole 20 MG tablet Commonly known as: PROTONIX Take 20 mg by mouth daily.            Durable Medical Equipment  (From admission, onward)         Start     Ordered   12/18/18 0817  For home use only DME Walker rolling  [facility])  Once    Comments: Rolling walker with 5" wheels  Question:  Patient needs a walker to treat with the following condition  Answer:  Weakness   12/18/18 0816   12/18/18 0816  DME 3-in-1  Once     12/18/18 0816         Follow-up Information    [facility] Follow [facility].   Specialty: Home Health Services Why: They will call you this week to schedule your first appointment.          Allergies  Allergen Reactions  . Demerol [Meperidine] Other (See Comments)    Excessive sweating, cramping  . Other     No Antibiotic without consultation with her primary physician.  . Codeine Rash  . Sulfa Antibiotics Itching and Other (See Comments)    Reaction unknown  . Tomato Itching    You were cared for by a hospitalist during your hospital stay. If you have any questions about your discharge medications or the care you received while you were in the hospital after you are discharged, you can call the unit and asked to speak with the hospitalist on call if the hospitalist that took care of you is not available. Once you are discharged, your primary care physician will handle any further medical issues. Please note that no refills for any discharge medications will be authorized once you are discharged, as it is imperative that you return to your primary care physician (or establish a relationship with a primary care physician if you do not have one) for your aftercare needs so that  they can reassess your need for medications and monitor your lab values.   Procedures/Studies: Ct Head Wo Contrast  Result Date: 12/15/2018 CLINICAL DATA:  Intermittent weakness for the past 2 days. Known left-sided meningioma. EXAM: CT HEAD WITHOUT CONTRAST TECHNIQUE: Contiguous axial images were obtained from the base of the skull through the vertex without intravenous contrast. COMPARISON:  Head CT dated 10/02/2017 and brain MR dated 10/02/2017. FINDINGS: Brain: Mild-to-moderate patchy white matter low  density in both cerebral hemispheres without significant change. Stable small left parafalcine meningioma. No intracranial hemorrhage, new mass lesion or CT evidence of acute infarction. Vascular: No hyperdense vessel or unexpected calcification. Skull: Normal. Negative for fracture or focal lesion. Sinuses/Orbits: Unremarkable. Other: None. IMPRESSION: 1. No acute abnormality. 2. Stable mild-to-moderate atrophy and chronic small vessel white matter ischemic changes in both cerebral hemispheres. 3. Stable small left parafalcine meningioma. Electronically Signed   By: Claudie Revering M.D.   On: 12/15/2018 14:16   Dg Chest Portable 1 View  Result Date: 12/16/2018 CLINICAL DATA:  Shortness of breath EXAM: PORTABLE CHEST 1 VIEW COMPARISON:  12/13/2018 FINDINGS: Cardiac shadow is stable. Aortic calcifications are again seen. The lungs are well aerated bilaterally. No focal infiltrate or sizable effusion is seen. Skin folds are noted over the left chest. No bony abnormality is seen. IMPRESSION: No acute abnormality noted. Electronically Signed   By: Inez Catalina M.D.   On: 12/16/2018 12:09   Dg Chest Portable 1 View  Result Date: 12/13/2018 CLINICAL DATA:  Weakness and cough EXAM: PORTABLE CHEST 1 VIEW COMPARISON:  10/02/2017 FINDINGS: Cardiac shadow is within normal limits. Aortic calcifications are again seen. The lungs are well aerated bilaterally. No focal infiltrate or sizable effusion is seen.  IMPRESSION: No acute abnormality noted. Electronically Signed   By: Inez Catalina M.D.   On: 12/13/2018 14:37   Dg Hip Unilat W Or Wo Pelvis 1 View Right  Result Date: 12/16/2018 CLINICAL DATA:  Fall,,confused ,,,,rt hip tenderness EXAM: DG HIP (WITH OR WITHOUT PELVIS) 1V RIGHT COMPARISON:  None. FINDINGS: No acute fracture or subluxation there is atherosclerotic calcification of the femoral arteries bilaterally. Bowel gas pattern is nonobstructive. IMPRESSION: No evidence for acute abnormality. Electronically Signed   By: Nolon Nations M.D.   On: 12/16/2018 12:58      The results of significant diagnostics from this hospitalization (including imaging, microbiology, ancillary and laboratory) are listed below for reference.     Microbiology: Recent Results (from the past 240 hour(s))  Urine culture     Status: Abnormal   Collection Time: 12/15/18  8:11 AM   Specimen: Urine, Random  Result Value Ref Range Status   Specimen Description URINE, RANDOM  Final   Special Requests NONE  Final   Culture (A)  Final    <10,000 COLONIES/mL INSIGNIFICANT GROWTH Performed at [facility], [address], [place], [place] [address]    Report Status 12/16/2018 FINAL  Final  SARS Coronavirus 2 [facility] order, Performed in [facility] hospital lab) Nasopharyngeal Nasopharyngeal Swab     Status: None   Collection Time: 12/15/18  8:11 AM   Specimen: Nasopharyngeal Swab  Result Value Ref Range Status   SARS Coronavirus 2 NEGATIVE NEGATIVE Final    Comment: (NOTE) If result is NEGATIVE SARS-CoV-2 target nucleic acids are NOT DETECTED. The SARS-CoV-2 RNA is generally detectable in upper and lower  respiratory specimens during the acute phase of infection. The lowest  concentration of SARS-CoV-2 viral copies this assay can detect is 250  copies / mL. A negative result does not preclude SARS-CoV-2 infection  and should not be used as the sole basis for treatment or other  patient management  decisions.  A negative result may occur with  improper specimen collection / handling, submission of specimen other  than nasopharyngeal swab, presence of viral mutation(s) within the  areas targeted by this assay, and inadequate number of viral copies  (<250 copies / mL). A negative  result must be combined with clinical  observations, patient history, and epidemiological information. If result is POSITIVE SARS-CoV-2 target nucleic acids are DETECTED. The SARS-CoV-2 RNA is generally detectable in upper and lower  respiratory specimens dur ing the acute phase of infection.  Positive  results are indicative of active infection with SARS-CoV-2.  Clinical  correlation with patient history and other diagnostic information is  necessary to determine patient infection status.  Positive results do  not rule out bacterial infection or co-infection with other viruses. If result is PRESUMPTIVE POSTIVE SARS-CoV-2 nucleic acids MAY BE PRESENT.   A presumptive positive result was obtained on the submitted specimen  and confirmed on repeat testing.  While 2019 novel coronavirus  (SARS-CoV-2) nucleic acids may be present in the submitted sample  additional confirmatory testing may be necessary for epidemiological  and / or clinical management purposes  to differentiate between  SARS-CoV-2 and other Sarbecovirus currently known to infect humans.  If clinically indicated additional testing with an alternate test  methodology [phone]) is advised. The SARS-CoV-2 RNA is generally  detectable in upper and lower respiratory sp ecimens during the acute  phase of infection. The expected result is Negative. Fact Sheet for Patients:  StrictlyIdeas.no Fact Sheet for Healthcare Providers: BankingDealers.co.za This test is not yet approved or cleared by the [place] FDA and has been authorized for detection and/or diagnosis of SARS-CoV-2 by FDA under an  Emergency Use Authorization (EUA).  This EUA will remain in effect (meaning this test can be used) for the duration of the COVID-19 declaration under Section 564(b)(1) of the Act, 21 U.S.C. section 360bbb-3(b)(1), unless the authorization is terminated or revoked sooner. Performed at [facility], [place] [address] [place], [place] [address]   Urine Culture     Status: Abnormal (Preliminary result)   Collection Time: 12/16/18  1:32 PM   Specimen: Urine, Random  Result Value Ref Range Status   Specimen Description URINE, RANDOM  Final   Special Requests NONE  Final   Culture (A)  Final    >=100,000 COLONIES/mL GRAM NEGATIVE RODS SUSCEPTIBILITIES TO FOLLOW Performed at [facility], [place] [address], [place], [place] [address]    Report Status PENDING  Incomplete     Labs: BNP (last 3 results) No results for input(s): BNP in the last 8760 hours. Basic Metabolic Panel: Recent Labs  Lab 12/13/18 1114 12/15/18 0811 12/16/18 1115 12/17/18 0241 12/18/18 0524  NA 130* 133* 131* 132* 132*  K 3.5 3.8 4.3 3.3* 3.7  CL 107 109 110 111 111  CO2 15* 17* 16* 13* 16*  GLUCOSE 90 86 88 76 69*  BUN 16 15 13 9 9   CREATININE 0.88 0.85 0.86 0.74 0.86  CALCIUM 8.4* 8.4* 8.2* 7.7* 8.1*  MG  --   --   --   --  1.7   Liver Function Tests: Recent Labs  Lab 12/13/18 1114 12/15/18 0811 12/16/18 1115 12/17/18 0241  AST 127* 137* 146* 146*  ALT 72* 77* 74* 75*  ALKPHOS 79 82 78 69  BILITOT 0.8 0.8 1.1 0.6  PROT 6.2* 5.9* 5.5* 4.6*  ALBUMIN 2.6* 2.5* 2.3* 2.0*   No results for input(s): LIPASE, AMYLASE in the last 168 hours. No results for input(s): AMMONIA in the last 168 hours. CBC: Recent Labs  Lab 12/13/18 1114 12/15/18 0811 12/16/18 1115 12/17/18 0340 12/18/18 0524  WBC 3.2* 3.3* 3.4* 2.6* 2.7*  NEUTROABS 2.1 2.2 2.3  --   --  HGB 10.8* 10.9* 10.7* 10.2* 9.7*  HCT 33.1* 32.8* 32.2* 29.7* 28.7*  MCV 96.2 94.5 95.8 91.1 92.6  PLT 159 148* 127* 129* 120*    Cardiac Enzymes: Recent Labs  Lab 12/16/18 1115  CKTOTAL 140   BNP: Invalid input(s): POCBNP CBG: Recent Labs  Lab 12/15/18 0718  GLUCAP 72   D-Dimer No results for input(s): DDIMER in the last 72 hours. Hgb A1c No results for input(s): HGBA1C in the last 72 hours. Lipid Profile No results for input(s): CHOL, HDL, LDLCALC, TRIG, CHOLHDL, LDLDIRECT in the last 72 hours. Thyroid function studies No results for input(s): TSH, T4TOTAL, T3FREE, THYROIDAB in the last 72 hours.  Invalid input(s): FREET3 Anemia work up No results for input(s): VITAMINB12, FOLATE, FERRITIN, TIBC, IRON, RETICCTPCT in the last 72 hours. Urinalysis    Component Value Date/Time   COLORURINE AMBER (A) 12/16/2018 1116   APPEARANCEUR CLOUDY (A) 12/16/2018 1116   LABSPEC 1.003 (L) 12/16/2018 1116   PHURINE 8.0 12/16/2018 1116   GLUCOSEU NEGATIVE 12/16/2018 1116   HGBUR MODERATE (A) 12/16/2018 1116   [place] 12/16/2018 1116   [place] 12/16/2018 1116   PROTEINUR 30 (A) 12/16/2018 1116   UROBILINOGEN 0.2 August 25, 202014 1048   NITRITE NEGATIVE 12/16/2018 1116   LEUKOCYTESUR LARGE (A) 12/16/2018 1116   Sepsis Labs Invalid input(s): PROCALCITONIN,  WBC,  LACTICIDVEN Microbiology Recent Results (from the past 240 hour(s))  Urine culture     Status: Abnormal   Collection Time: 12/15/18  8:11 AM   Specimen: Urine, Random  Result Value Ref Range Status   Specimen Description URINE, RANDOM  Final   Special Requests NONE  Final   Culture (A)  Final    <10,000 COLONIES/mL INSIGNIFICANT GROWTH Performed at [facility], [address], [place], [place] [address]    Report Status 12/16/2018 FINAL  Final  SARS Coronavirus 2 [facility] order, Performed in [facility] hospital lab) Nasopharyngeal Nasopharyngeal Swab     Status: None   Collection Time: 12/15/18  8:11 AM   Specimen: Nasopharyngeal Swab  Result Value Ref Range Status   SARS Coronavirus 2 NEGATIVE NEGATIVE Final     Comment: (NOTE) If result is NEGATIVE SARS-CoV-2 target nucleic acids are NOT DETECTED. The SARS-CoV-2 RNA is generally detectable in upper and lower  respiratory specimens during the acute phase of infection. The lowest  concentration of SARS-CoV-2 viral copies this assay can detect is 250  copies / mL. A negative result does not preclude SARS-CoV-2 infection  and should not be used as the sole basis for treatment or other  patient management decisions.  A negative result may occur with  improper specimen collection / handling, submission of specimen other  than nasopharyngeal swab, presence of viral mutation(s) within the  areas targeted by this assay, and inadequate number of viral copies  (<250 copies / mL). A negative result must be combined with clinical  observations, patient history, and epidemiological information. If result is POSITIVE SARS-CoV-2 target nucleic acids are DETECTED. The SARS-CoV-2 RNA is generally detectable in upper and lower  respiratory specimens dur ing the acute phase of infection.  Positive  results are indicative of active infection with SARS-CoV-2.  Clinical  correlation with patient history and other diagnostic information is  necessary to determine patient infection status.  Positive results do  not rule out bacterial infection or co-infection with other viruses. If result is PRESUMPTIVE POSTIVE SARS-CoV-2 nucleic acids MAY BE PRESENT.   A presumptive positive result was obtained  on the submitted specimen  and confirmed on repeat testing.  While 2019 novel coronavirus  (SARS-CoV-2) nucleic acids may be present in the submitted sample  additional confirmatory testing may be necessary for epidemiological  and / or clinical management purposes  to differentiate between  SARS-CoV-2 and other Sarbecovirus currently known to infect humans.  If clinically indicated additional testing with an alternate test  methodology [phone]) is advised. The SARS-CoV-2  RNA is generally  detectable in upper and lower respiratory sp ecimens during the acute  phase of infection. The expected result is Negative. Fact Sheet for Patients:  StrictlyIdeas.no Fact Sheet for Healthcare Providers: BankingDealers.co.za This test is not yet approved or cleared by the [place] FDA and has been authorized for detection and/or diagnosis of SARS-CoV-2 by FDA under an Emergency Use Authorization (EUA).  This EUA will remain in effect (meaning this test can be used) for the duration of the COVID-19 declaration under Section 564(b)(1) of the Act, 21 U.S.C. section 360bbb-3(b)(1), unless the authorization is terminated or revoked sooner. Performed at [facility], [place] [address] [place], [place] [address]   Urine Culture     Status: Abnormal (Preliminary result)   Collection Time: 12/16/18  1:32 PM   Specimen: Urine, Random  Result Value Ref Range Status   Specimen Description URINE, RANDOM  Final   Special Requests NONE  Final   Culture (A)  Final    >=100,000 COLONIES/mL GRAM NEGATIVE RODS SUSCEPTIBILITIES TO FOLLOW Performed at [facility], [place] [address], [place], [place] [address]    Report Status PENDING  Incomplete     Time coordinating discharge:  I have spent 35 minutes face to face with the patient and on the ward discussing the patients care, assessment, plan and disposition with other care givers. >50% of the time was devoted counseling the patient about the risks and benefits of treatment/Discharge disposition and coordinating care.   SIGNED:   Damita Lack, MD  [facility] 12/18/2018, 2:49 PM   If 7PM-7AM, please contact night-coverage [email or web address]

## 2018-12-19 ENCOUNTER — Other Ambulatory Visit: Payer: Self-pay

## 2018-12-19 ENCOUNTER — Encounter (HOSPITAL_COMMUNITY): Payer: Self-pay | Admitting: Emergency Medicine

## 2018-12-19 ENCOUNTER — Emergency Department (HOSPITAL_COMMUNITY): Payer: Medicare Other

## 2018-12-19 ENCOUNTER — Inpatient Hospital Stay (HOSPITAL_COMMUNITY)
Admission: EM | Admit: 2018-12-19 | Discharge: 2019-01-31 | DRG: 640 | Disposition: A | Discharge status: Home Health | Payer: Medicare Other | Attending: Internal Medicine | Admitting: Internal Medicine

## 2018-12-19 DIAGNOSIS — E440 Moderate protein-calorie malnutrition: Secondary | ICD-10-CM | POA: Diagnosis present

## 2018-12-19 DIAGNOSIS — Z008 Encounter for other general examination: Secondary | ICD-10-CM

## 2018-12-19 DIAGNOSIS — H9190 Unspecified hearing loss, unspecified ear: Secondary | ICD-10-CM | POA: Diagnosis present

## 2018-12-19 DIAGNOSIS — Z7902 Long term (current) use of antithrombotics/antiplatelets: Secondary | ICD-10-CM

## 2018-12-19 DIAGNOSIS — F329 Major depressive disorder, single episode, unspecified: Secondary | ICD-10-CM | POA: Diagnosis present

## 2018-12-19 DIAGNOSIS — R627 Adult failure to thrive: Principal | ICD-10-CM | POA: Diagnosis present

## 2018-12-19 DIAGNOSIS — B370 Candidal stomatitis: Secondary | ICD-10-CM | POA: Diagnosis present

## 2018-12-19 DIAGNOSIS — M810 Age-related osteoporosis without current pathological fracture: Secondary | ICD-10-CM | POA: Diagnosis present

## 2018-12-19 DIAGNOSIS — E162 Hypoglycemia, unspecified: Secondary | ICD-10-CM | POA: Diagnosis not present

## 2018-12-19 DIAGNOSIS — L89152 Pressure ulcer of sacral region, stage 2: Secondary | ICD-10-CM | POA: Diagnosis present

## 2018-12-19 DIAGNOSIS — E876 Hypokalemia: Secondary | ICD-10-CM | POA: Diagnosis not present

## 2018-12-19 DIAGNOSIS — E46 Unspecified protein-calorie malnutrition: Secondary | ICD-10-CM | POA: Diagnosis present

## 2018-12-19 DIAGNOSIS — Z7982 Long term (current) use of aspirin: Secondary | ICD-10-CM

## 2018-12-19 DIAGNOSIS — E861 Hypovolemia: Secondary | ICD-10-CM | POA: Diagnosis present

## 2018-12-19 DIAGNOSIS — D61818 Other pancytopenia: Secondary | ICD-10-CM | POA: Diagnosis present

## 2018-12-19 DIAGNOSIS — L89154 Pressure ulcer of sacral region, stage 4: Secondary | ICD-10-CM | POA: Diagnosis present

## 2018-12-19 DIAGNOSIS — Z86011 Personal history of benign neoplasm of the brain: Secondary | ICD-10-CM

## 2018-12-19 DIAGNOSIS — R059 Cough, unspecified: Secondary | ICD-10-CM

## 2018-12-19 DIAGNOSIS — R7989 Other specified abnormal findings of blood chemistry: Secondary | ICD-10-CM

## 2018-12-19 DIAGNOSIS — Z8249 Family history of ischemic heart disease and other diseases of the circulatory system: Secondary | ICD-10-CM

## 2018-12-19 DIAGNOSIS — R109 Unspecified abdominal pain: Secondary | ICD-10-CM

## 2018-12-19 DIAGNOSIS — Z20828 Contact with and (suspected) exposure to other viral communicable diseases: Secondary | ICD-10-CM | POA: Diagnosis present

## 2018-12-19 DIAGNOSIS — E860 Dehydration: Secondary | ICD-10-CM | POA: Diagnosis not present

## 2018-12-19 DIAGNOSIS — E538 Deficiency of other specified B group vitamins: Secondary | ICD-10-CM | POA: Diagnosis present

## 2018-12-19 DIAGNOSIS — Z8673 Personal history of transient ischemic attack (TIA), and cerebral infarction without residual deficits: Secondary | ICD-10-CM

## 2018-12-19 DIAGNOSIS — N390 Urinary tract infection, site not specified: Secondary | ICD-10-CM | POA: Diagnosis present

## 2018-12-19 DIAGNOSIS — Z885 Allergy status to narcotic agent status: Secondary | ICD-10-CM

## 2018-12-19 DIAGNOSIS — H5710 Ocular pain, unspecified eye: Secondary | ICD-10-CM | POA: Diagnosis not present

## 2018-12-19 DIAGNOSIS — L8990 Pressure ulcer of unspecified site, unspecified stage: Secondary | ICD-10-CM | POA: Diagnosis present

## 2018-12-19 DIAGNOSIS — I959 Hypotension, unspecified: Secondary | ICD-10-CM | POA: Diagnosis not present

## 2018-12-19 DIAGNOSIS — I871 Compression of vein: Secondary | ICD-10-CM

## 2018-12-19 DIAGNOSIS — R634 Abnormal weight loss: Secondary | ICD-10-CM | POA: Diagnosis present

## 2018-12-19 DIAGNOSIS — J90 Pleural effusion, not elsewhere classified: Secondary | ICD-10-CM | POA: Diagnosis present

## 2018-12-19 DIAGNOSIS — Z823 Family history of stroke: Secondary | ICD-10-CM

## 2018-12-19 DIAGNOSIS — D638 Anemia in other chronic diseases classified elsewhere: Secondary | ICD-10-CM | POA: Diagnosis present

## 2018-12-19 DIAGNOSIS — R7401 Elevation of levels of liver transaminase levels: Secondary | ICD-10-CM | POA: Diagnosis present

## 2018-12-19 DIAGNOSIS — T85598A Other mechanical complication of other gastrointestinal prosthetic devices, implants and grafts, initial encounter: Secondary | ICD-10-CM

## 2018-12-19 DIAGNOSIS — E871 Hypo-osmolality and hyponatremia: Secondary | ICD-10-CM | POA: Diagnosis present

## 2018-12-19 DIAGNOSIS — M6281 Muscle weakness (generalized): Secondary | ICD-10-CM | POA: Diagnosis present

## 2018-12-19 DIAGNOSIS — E43 Unspecified severe protein-calorie malnutrition: Secondary | ICD-10-CM | POA: Diagnosis not present

## 2018-12-19 DIAGNOSIS — Z91018 Allergy to other foods: Secondary | ICD-10-CM

## 2018-12-19 DIAGNOSIS — G9341 Metabolic encephalopathy: Secondary | ICD-10-CM | POA: Diagnosis present

## 2018-12-19 DIAGNOSIS — I10 Essential (primary) hypertension: Secondary | ICD-10-CM | POA: Diagnosis present

## 2018-12-19 DIAGNOSIS — R1310 Dysphagia, unspecified: Secondary | ICD-10-CM | POA: Diagnosis present

## 2018-12-19 DIAGNOSIS — Z8619 Personal history of other infectious and parasitic diseases: Secondary | ICD-10-CM

## 2018-12-19 DIAGNOSIS — E878 Other disorders of electrolyte and fluid balance, not elsewhere classified: Secondary | ICD-10-CM | POA: Diagnosis present

## 2018-12-19 DIAGNOSIS — Z681 Body mass index (BMI) 19 or less, adult: Secondary | ICD-10-CM

## 2018-12-19 DIAGNOSIS — R531 Weakness: Secondary | ICD-10-CM

## 2018-12-19 DIAGNOSIS — J9811 Atelectasis: Secondary | ICD-10-CM | POA: Diagnosis present

## 2018-12-19 DIAGNOSIS — R55 Syncope and collapse: Secondary | ICD-10-CM

## 2018-12-19 DIAGNOSIS — T17908A Unspecified foreign body in respiratory tract, part unspecified causing other injury, initial encounter: Secondary | ICD-10-CM

## 2018-12-19 DIAGNOSIS — R54 Age-related physical debility: Secondary | ICD-10-CM | POA: Diagnosis present

## 2018-12-19 DIAGNOSIS — N179 Acute kidney failure, unspecified: Secondary | ICD-10-CM | POA: Diagnosis not present

## 2018-12-19 DIAGNOSIS — Z882 Allergy status to sulfonamides status: Secondary | ICD-10-CM

## 2018-12-19 DIAGNOSIS — R05 Cough: Secondary | ICD-10-CM

## 2018-12-19 DIAGNOSIS — E785 Hyperlipidemia, unspecified: Secondary | ICD-10-CM | POA: Diagnosis present

## 2018-12-19 DIAGNOSIS — R4702 Dysphasia: Secondary | ICD-10-CM

## 2018-12-19 DIAGNOSIS — R195 Other fecal abnormalities: Secondary | ICD-10-CM

## 2018-12-19 MED ORDER — SODIUM CHLORIDE 0.9 % IV BOLUS
500.0000 mL | Freq: Once | INTRAVENOUS | Status: AC
  Administered 2018-12-20: 01:00:00 500 mL via INTRAVENOUS

## 2018-12-19 NOTE — ED Provider Notes (Signed)
[facility] EMERGENCY DEPARTMENT Provider Note   CSN: [id number] Arrival date & time: 12/19/18  2314     History   Chief Complaint Chief Complaint  Patient presents with   Hypotension    HPI Sydney [name] is a 85 y.o. female.     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  Patient is understanding.  EMS reports less blood pressure was a systolic history of 50 upon standing.  She received 500 cc normal saline.  Blood pressures is now 120/63.  Patient states she feels weak all over.  Notably she got out of the hospital yesterday after admission for sepsis and UTI.  She denies having any pain anywhere.  She had one episode of vomiting at home.  No diarrhea.  No chest pain or shortness of breath.  No cough or fever.  She denies any abdominal pain currently.  She denies any swelling in her head.  Hospital yesterday after stay for UTI with sepsis.  She was sent home with Keflex.  Denies any pain with urination or blood in the urine.  She denies any chest pain or shortness of breath.  She denies any headache, cough or fever.   Daughter at bedside states patient was slumped over on the bedside commode which led her to call 911.  She did not fall or hit her head.  She is uncertain whether she lost consciousness.  Daughter states patient has improved somewhat since she was admitted but still has generalized weakness is certainly not at her baseline.  The history is provided by the patient.    Past Medical History:  Diagnosis Date   Anemia    years ago   Arthritis    "left hand" (02/17/2013)   C. difficile diarrhea    Glaucoma    High cholesterol    "on RX years ago" (02/17/2013)   Hypertension    Meningioma ([place]) 05/01/2015   Osteoporosis 02/2014   T score -2.5   followed by Dr. Lysle Rubens   Palpitations    PACs, PVCs and short runs of atrial tachycardia on Holter monitoring   TIA (transient ischemic attack) 1990's    Patient Active Problem List   Diagnosis Date Noted   General weakness 12/17/2018   Generalized weakness 12/16/2018   Pressure injury of skin 12/16/2018   Transaminitis 12/16/2018   Acute lower UTI 12/16/2018   Ataxia 10/02/2017   History of TIA (transient ischemic attack) 10/02/2017   HTN (hypertension) 10/02/2017   HLD (hyperlipidemia) 10/02/2017   Palpable mass of neck-?? lymph node 10/02/2017   Protein-calorie malnutrition, severe 10/20/2016   C. difficile diarrhea    Hypertension 10/17/2016   High cholesterol 10/17/2016   Recurrent colitis due to Clostridium difficile 10/17/2016   Dehydration with hyponatremia 10/17/2016   Acute hypokalemia 10/17/2016   Protein calorie malnutrition ([place]) 10/17/2016   C. 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   Diplopia 04/08/2015   Pancytopenia ([place]) 04/08/2015   Bradycardia 02/17/2013   Occlusion and stenosis of carotid artery without mention  of cerebral infarction 02/17/2013    Past Surgical History:  Procedure Laterality Date   ANAL FISTULECTOMY  1970   COLONOSCOPY     COLONOSCOPY WITH PROPOFOL N/A 12/02/2016   Procedure: COLONOSCOPY WITH PROPOFOL;  Surgeon: Gatha Mayer, MD;  Location: [facility];  Service: Endoscopy;  Laterality: N/A;   FECAL TRANSPLANT N/A 12/02/2016   Procedure: FECAL TRANSPLANT;  Surgeon: Gatha Mayer, MD;  Location[facility] ENDOSCOPY;  Service: Endoscopy;  Laterality: N/A;    TONSILLECTOMY       OB History    Gravida  1   Para  1   Term  1   Preterm      AB      Living  1     SAB      TAB      Ectopic      Multiple      Live Births               Home Medications    Prior to Admission medications   Medication Sig Start Date End Date Taking? Authorizing Provider  aspirin (ECOTRIN LOW STRENGTH) 81 MG EC tablet Take 81 mg by mouth daily. [name] whole.    [provider]  cephALEXin (KEFLEX) 500 MG capsule Take 1 capsule (500 mg total) by mouth every 8 (eight) hours for 1 day. 12/18/18 12/19/18  Damita Lack, MD  cholecalciferol (VITAMIN D) 1000 units tablet Take 1,000 Units by mouth daily.    [provider]  clopidogrel (PLAVIX) 75 MG tablet Take 75 mg by mouth daily.    [provider]  folic acid (FOLVITE) 1 MG tablet Take 1 mg by mouth daily.    [provider]  latanoprost (XALATAN) 0.005 % ophthalmic solution Place 1 drop into both eyes at bedtime.     [provider]  loratadine (CLARITIN) 10 MG tablet Take 1 tablet (10 mg total) by mouth daily. Patient taking differently: Take 10 mg by mouth daily as needed for allergies or rhinitis.  10/05/17   Thurnell Lose, MD  pantoprazole (PROTONIX) 20 MG tablet Take 20 mg by mouth daily. 12/09/18   [provider]    Family History Family History  Problem Relation Age of Onset   Hypertension Father    Heart attack Father    Stroke Brother    Stroke Maternal Grandmother     Social History Social History   Tobacco Use   Smoking status: Never Smoker   Smokeless tobacco: Never Used  Substance Use Topics   Alcohol use: No   Drug use: No     Allergies   Demerol [meperidine], Other, Codeine, Sulfa antibiotics, and Tomato   Review of Systems Review of Systems  Constitutional: Positive for activity change, appetite change and fatigue. Negative for fever.  HENT: Negative for congestion and rhinorrhea.   Eyes:  Negative for visual disturbance.  Respiratory: Negative for cough, chest tightness and shortness of breath.   Gastrointestinal: Positive for nausea and vomiting. Negative for abdominal pain.  Genitourinary: Negative for dysuria and hematuria.  Musculoskeletal: Negative for back pain.  Neurological: Positive for dizziness, weakness and light-headedness.    all other systems are negative except as noted in the HPI and PMH.    Physical Exam Updated Vital Signs BP (!) 123/50 (BP Location: Right Arm)    Pulse 72    Temp 99.1 F (37.3 C) (Oral)    Resp 16    SpO2 100%   Physical  Exam Vitals signs and nursing note reviewed.  Constitutional:      General: She is not in acute distress.    Appearance: She is well-developed.  HENT:     Head: Normocephalic and atraumatic.     Mouth/Throat:     Mouth: Mucous membranes are dry.     Pharynx: No oropharyngeal exudate.  Eyes:     Conjunctiva/sclera: Conjunctivae normal.     Pupils: Pupils are equal, round, and reactive to light.  Neck:     Musculoskeletal: Normal range of motion and neck supple.     Comments: No meningismus. Cardiovascular:     Rate and Rhythm: Normal rate and regular rhythm.     Heart sounds: Normal heart sounds. No murmur.  Pulmonary:     Effort: Pulmonary effort is normal. No respiratory distress.     Breath sounds: Normal breath sounds.  Abdominal:     Palpations: Abdomen is soft.     Tenderness: There is no abdominal tenderness. There is no guarding or rebound.  Musculoskeletal: Normal range of motion.        General: No tenderness.  Skin:    General: Skin is warm.  Neurological:     Mental Status: She is alert and oriented to person, place, and time.     Cranial Nerves: No cranial nerve deficit.     Motor: No abnormal muscle tone.     Coordination: Coordination normal.     Comments:  4/5 strength throughout. CN 2-12 intact.Equal grip strength.  Difficulty raising each leg off the bed bilaterally.  Equal grip  strength bilaterally.  Cranial nerves II to XII intact.  Psychiatric:        Behavior: Behavior normal.      ED Treatments / Results  Labs (all labs ordered are listed, but only abnormal results are displayed) Labs Reviewed  CBC WITH DIFFERENTIAL/PLATELET - Abnormal; Notable for the following components:      Result Value   WBC 3.0 (*)    RBC 3.56 (*)    Hemoglobin 11.1 (*)    HCT 33.7 (*)    Platelets 113 (*)    Lymphs Abs 0.5 (*)    All other components within normal limits  COMPREHENSIVE METABOLIC PANEL - Abnormal; Notable for the following components:   Sodium 128 (*)    CO2 16 (*)    Calcium 8.3 (*)    Total Protein 5.6 (*)    Albumin 2.3 (*)    AST 268 (*)    ALT 135 (*)    All other components within normal limits  LIPASE, BLOOD - Abnormal; Notable for the following components:   Lipase 123 (*)    All other components within normal limits  URINE CULTURE  CULTURE, BLOOD (ROUTINE X 2)  CULTURE, BLOOD (ROUTINE X 2)  LACTIC ACID, PLASMA  URINALYSIS, ROUTINE W REFLEX MICROSCOPIC  TROPONIN I (HIGH SENSITIVITY)  TROPONIN I (HIGH SENSITIVITY)    EKG EKG Interpretation  Date/Time:  Monday December 19 2018 23:56:54 EDT Ventricular Rate:  76 PR Interval:    QRS Duration: 90 QT Interval:  354 QTC Calculation: 398 R Axis:   40 Text Interpretation:  Sinus rhythm Anteroseptal infarct, age indeterminate No significant change was found Confirmed by Ezequiel Essex [phone]) on 12/20/2018 1:04:24 AM   Radiology Ct Abdomen Pelvis W Contrast  Result Date: 12/20/2018 CLINICAL DATA:  Abnormal LFTs, generalized weakness EXAM: CT ABDOMEN AND PELVIS WITH CONTRAST TECHNIQUE: Multidetector CT imaging of the abdomen and pelvis was performed  using the standard protocol following bolus administration of intravenous contrast. CONTRAST:  160mL OMNIPAQUE IOHEXOL 300 MG/ML  SOLN COMPARISON:  CT 10/17/2016 FINDINGS: Lower chest: Lung bases demonstrate small bilateral pleural effusions and  passive atelectasis within the posterior lower lobes. The heart appears slightly enlarged. Hepatobiliary: Central hepatic cyst. No calcified gallstone or biliary dilatation Pancreas: Unremarkable. No pancreatic ductal dilatation or surrounding inflammatory changes. Spleen: Normal in size without focal abnormality. Adrenals/Urinary Tract: Adrenal glands are unremarkable. Kidneys are normal, without renal calculi, focal lesion, or hydronephrosis. Bladder is unremarkable. Stomach/Bowel: Stomach is nonenlarged. No dilated small bowel. Fluid-filled colon without bowel wall thickening. Appendix not clearly identified but no right lower quadrant inflammatory process. Vascular/Lymphatic: Nonaneurysmal aorta. Scattered aortic atherosclerosis. No significantly enlarged lymph nodes Reproductive: Calcified uterine fibroids.  No adnexal mass Other: No free air. Probable small amount of free fluid in the pelvis Musculoskeletal: Trace anterolisthesis L3 on L4. Degenerative changes at L3-L4 and L4-L5 IMPRESSION: 1. No CT evident for acute intra-abdominal or pelvic abnormality. Diffuse fluid within the colon suggesting possible enteritis or diarrheal illness 2. Small bilateral pleural effusions with passive atelectasis at the bases 3. Uterine fibroids Electronically Signed   By: Donavan Foil M.D.   On: 12/20/2018 02:28   Dg Abdomen Acute W/chest  Result Date: 12/20/2018 CLINICAL DATA:  85 year old female with generalized weakness. EXAM: DG ABDOMEN ACUTE W/ 1V CHEST COMPARISON:  CT of the abdomen pelvis dated 10/18/2016 FINDINGS: Probable background of emphysema. Trace right pleural effusion with associated atelectasis. No focal consolidation or pneumothorax. Mild cardiomegaly. Atherosclerotic calcification of the aorta. There is no bowel dilatation or evidence of obstruction. No free air. Probable calcified fibroid in the pelvis. Degenerative changes of the spine and hips. The soft tissues are unremarkable. IMPRESSION: 1. Trace  right pleural effusion with associated atelectasis. 2. No bowel obstruction. Electronically Signed   By: Anner Crete M.D.   On: 12/20/2018 00:05    Procedures Procedures (including critical care time)  Medications Ordered in ED Medications  sodium chloride 0.9 % bolus 500 mL (has no administration in time range)     Initial Impression / Assessment and Plan / ED Course  I have reviewed the triage vital signs and the nursing notes.  Pertinent labs & imaging results that were available during my care of the patient were reviewed by me and considered in my medical decision making (see chart for details).       Patient returns with generalized weakness and hypotension.  Recent admission for UTI sepsis.  Discharged on keflex.  Urine culture is growing gram-negative rods with speciation and sensitivity pending.   Patient hydrated gently.  She has persistently low bicarb on chemistry and low sodium of 128.  LFT elevation is mildly progressed and lipase is 123.  We will proceed with abdominal imaging. Patient's daughter hesitant for her to get any antibiotics given her history of C. difficile colitis in the past. UA today appears clean. Blood and urine cultures sent.   CT without acute pathology. Liver and GB appear normal. Hepatitis panel from recent admission pending.  Suspect dehydration rather than sepsis as source of hypotension. Lactate normal.  Will hold antibiotics at this time given history of recurrent C dif.   With persistent weakness and near syncope with hypotension, observation admission d/w Dr. Myna Hidalgo. Daughter updated.  Final Clinical Impressions(s) / ED Diagnoses   Final diagnoses:  Near syncope  Elevated LFTs  Dehydration    ED Discharge Orders    None  Ezequiel Essex, MD 12/20/18 [phone]

## 2018-12-19 NOTE — ED Triage Notes (Signed)
Pt BIB [facility] from home c/o generalized weakness, treated yesterday for a UTI. EMS reports pt initial BP 80palp, dropped to 50 systolic on standing. Pt A&O on arrival to ED, given 500ccNS PTA, BP improved to 120/63.

## 2018-12-20 ENCOUNTER — Encounter (HOSPITAL_COMMUNITY): Payer: Self-pay | Admitting: Radiology

## 2018-12-20 ENCOUNTER — Emergency Department (HOSPITAL_COMMUNITY): Payer: Medicare Other

## 2018-12-20 ENCOUNTER — Telehealth: Payer: Self-pay | Admitting: *Deleted

## 2018-12-20 DIAGNOSIS — N183 Chronic kidney disease, stage 3 unspecified: Secondary | ICD-10-CM | POA: Diagnosis not present

## 2018-12-20 DIAGNOSIS — E785 Hyperlipidemia, unspecified: Secondary | ICD-10-CM | POA: Diagnosis present

## 2018-12-20 DIAGNOSIS — R55 Syncope and collapse: Secondary | ICD-10-CM

## 2018-12-20 DIAGNOSIS — Z20828 Contact with and (suspected) exposure to other viral communicable diseases: Secondary | ICD-10-CM | POA: Diagnosis present

## 2018-12-20 DIAGNOSIS — E860 Dehydration: Secondary | ICD-10-CM

## 2018-12-20 DIAGNOSIS — N390 Urinary tract infection, site not specified: Secondary | ICD-10-CM | POA: Diagnosis not present

## 2018-12-20 DIAGNOSIS — E46 Unspecified protein-calorie malnutrition: Secondary | ICD-10-CM

## 2018-12-20 DIAGNOSIS — Z0489 Encounter for examination and observation for other specified reasons: Secondary | ICD-10-CM | POA: Diagnosis not present

## 2018-12-20 DIAGNOSIS — E871 Hypo-osmolality and hyponatremia: Secondary | ICD-10-CM

## 2018-12-20 DIAGNOSIS — Z681 Body mass index (BMI) 19 or less, adult: Secondary | ICD-10-CM | POA: Diagnosis not present

## 2018-12-20 DIAGNOSIS — K921 Melena: Secondary | ICD-10-CM

## 2018-12-20 DIAGNOSIS — K7200 Acute and subacute hepatic failure without coma: Secondary | ICD-10-CM | POA: Diagnosis not present

## 2018-12-20 DIAGNOSIS — R630 Anorexia: Secondary | ICD-10-CM | POA: Diagnosis not present

## 2018-12-20 DIAGNOSIS — J90 Pleural effusion, not elsewhere classified: Secondary | ICD-10-CM | POA: Diagnosis present

## 2018-12-20 DIAGNOSIS — D61818 Other pancytopenia: Secondary | ICD-10-CM | POA: Diagnosis present

## 2018-12-20 DIAGNOSIS — R12 Heartburn: Secondary | ICD-10-CM | POA: Diagnosis not present

## 2018-12-20 DIAGNOSIS — R29898 Other symptoms and signs involving the musculoskeletal system: Secondary | ICD-10-CM | POA: Diagnosis not present

## 2018-12-20 DIAGNOSIS — E8809 Other disorders of plasma-protein metabolism, not elsewhere classified: Secondary | ICD-10-CM | POA: Diagnosis not present

## 2018-12-20 DIAGNOSIS — R945 Abnormal results of liver function studies: Secondary | ICD-10-CM | POA: Diagnosis not present

## 2018-12-20 DIAGNOSIS — E162 Hypoglycemia, unspecified: Secondary | ICD-10-CM | POA: Diagnosis not present

## 2018-12-20 DIAGNOSIS — Z8719 Personal history of other diseases of the digestive system: Secondary | ICD-10-CM

## 2018-12-20 DIAGNOSIS — L89154 Pressure ulcer of sacral region, stage 4: Secondary | ICD-10-CM | POA: Diagnosis present

## 2018-12-20 DIAGNOSIS — I10 Essential (primary) hypertension: Secondary | ICD-10-CM | POA: Diagnosis present

## 2018-12-20 DIAGNOSIS — Z008 Encounter for other general examination: Secondary | ICD-10-CM | POA: Diagnosis not present

## 2018-12-20 DIAGNOSIS — N179 Acute kidney failure, unspecified: Secondary | ICD-10-CM | POA: Diagnosis not present

## 2018-12-20 DIAGNOSIS — R531 Weakness: Secondary | ICD-10-CM | POA: Diagnosis not present

## 2018-12-20 DIAGNOSIS — R4702 Dysphasia: Secondary | ICD-10-CM | POA: Diagnosis not present

## 2018-12-20 DIAGNOSIS — Z8673 Personal history of transient ischemic attack (TIA), and cerebral infarction without residual deficits: Secondary | ICD-10-CM

## 2018-12-20 DIAGNOSIS — E440 Moderate protein-calorie malnutrition: Secondary | ICD-10-CM | POA: Diagnosis present

## 2018-12-20 DIAGNOSIS — E876 Hypokalemia: Secondary | ICD-10-CM | POA: Diagnosis not present

## 2018-12-20 DIAGNOSIS — E861 Hypovolemia: Secondary | ICD-10-CM | POA: Diagnosis present

## 2018-12-20 DIAGNOSIS — Z885 Allergy status to narcotic agent status: Secondary | ICD-10-CM

## 2018-12-20 DIAGNOSIS — F0390 Unspecified dementia without behavioral disturbance: Secondary | ICD-10-CM | POA: Diagnosis not present

## 2018-12-20 DIAGNOSIS — Z91018 Allergy to other foods: Secondary | ICD-10-CM

## 2018-12-20 DIAGNOSIS — L89152 Pressure ulcer of sacral region, stage 2: Secondary | ICD-10-CM | POA: Diagnosis present

## 2018-12-20 DIAGNOSIS — K831 Obstruction of bile duct: Secondary | ICD-10-CM | POA: Diagnosis not present

## 2018-12-20 DIAGNOSIS — F329 Major depressive disorder, single episode, unspecified: Secondary | ICD-10-CM | POA: Diagnosis present

## 2018-12-20 DIAGNOSIS — R634 Abnormal weight loss: Secondary | ICD-10-CM

## 2018-12-20 DIAGNOSIS — G9341 Metabolic encephalopathy: Secondary | ICD-10-CM | POA: Diagnosis present

## 2018-12-20 DIAGNOSIS — E538 Deficiency of other specified B group vitamins: Secondary | ICD-10-CM | POA: Diagnosis present

## 2018-12-20 DIAGNOSIS — L89159 Pressure ulcer of sacral region, unspecified stage: Secondary | ICD-10-CM | POA: Diagnosis not present

## 2018-12-20 DIAGNOSIS — R7989 Other specified abnormal findings of blood chemistry: Secondary | ICD-10-CM | POA: Insufficient documentation

## 2018-12-20 DIAGNOSIS — R627 Adult failure to thrive: Secondary | ICD-10-CM | POA: Diagnosis present

## 2018-12-20 DIAGNOSIS — R5381 Other malaise: Secondary | ICD-10-CM | POA: Diagnosis not present

## 2018-12-20 DIAGNOSIS — R5383 Other fatigue: Secondary | ICD-10-CM | POA: Diagnosis not present

## 2018-12-20 DIAGNOSIS — Z881 Allergy status to other antibiotic agents status: Secondary | ICD-10-CM

## 2018-12-20 DIAGNOSIS — J9811 Atelectasis: Secondary | ICD-10-CM | POA: Diagnosis present

## 2018-12-20 DIAGNOSIS — Z1339 Encounter for screening examination for other mental health and behavioral disorders: Secondary | ICD-10-CM | POA: Diagnosis not present

## 2018-12-20 DIAGNOSIS — R740 Nonspecific elevation of levels of transaminase and lactic acid dehydrogenase [LDH]: Secondary | ICD-10-CM | POA: Diagnosis not present

## 2018-12-20 DIAGNOSIS — E43 Unspecified severe protein-calorie malnutrition: Secondary | ICD-10-CM | POA: Diagnosis not present

## 2018-12-20 DIAGNOSIS — D649 Anemia, unspecified: Secondary | ICD-10-CM | POA: Diagnosis not present

## 2018-12-20 DIAGNOSIS — D638 Anemia in other chronic diseases classified elsewhere: Secondary | ICD-10-CM | POA: Diagnosis present

## 2018-12-20 DIAGNOSIS — D72819 Decreased white blood cell count, unspecified: Secondary | ICD-10-CM | POA: Diagnosis not present

## 2018-12-20 DIAGNOSIS — B370 Candidal stomatitis: Secondary | ICD-10-CM | POA: Diagnosis present

## 2018-12-20 LAB — URINE CULTURE: Culture: >=100000 — AB

## 2018-12-20 LAB — URINALYSIS, ROUTINE W REFLEX MICROSCOPIC
Bilirubin Urine: NEGATIVE
Glucose, UA: NEGATIVE mg/dL
Ketones, ur: NEGATIVE mg/dL
Leukocytes,Ua: NEGATIVE
Nitrite: NEGATIVE
Protein, ur: NEGATIVE mg/dL
Specific Gravity, Urine: 1.008 (ref 1.005–1.030)
pH: 6 (ref 5.0–8.0)

## 2018-12-20 LAB — CBC WITH DIFFERENTIAL/PLATELET
Abs Immature Granulocytes: 0.02 10*3/uL (ref 0.00–0.07)
Basophils Absolute: 0 10*3/uL (ref 0.0–0.1)
Basophils Absolute: 0 10*3/uL (ref 0.0–0.1)
Basophils Relative: 0 %
Basophils Relative: 0 %
Eosinophils Absolute: 0.1 10*3/uL (ref 0.0–0.5)
Eosinophils Absolute: 0.1 10*3/uL (ref 0.0–0.5)
Eosinophils Relative: 3 %
Eosinophils Relative: 4 %
HCT: 29.6 % — ABNORMAL LOW (ref 36.0–46.0)
HCT: 33.7 % — ABNORMAL LOW (ref 36.0–46.0)
Hemoglobin: 10 g/dL — ABNORMAL LOW (ref 12.0–15.0)
Hemoglobin: 11.1 g/dL — ABNORMAL LOW (ref 12.0–15.0)
Immature Granulocytes: 1 %
Lymphocytes Relative: 18 %
Lymphocytes Relative: 18 %
Lymphs Abs: 0.4 10*3/uL — ABNORMAL LOW (ref 0.7–4.0)
Lymphs Abs: 0.5 10*3/uL — ABNORMAL LOW (ref 0.7–4.0)
MCH: 31.2 pg (ref 26.0–34.0)
MCH: 31.7 pg (ref 26.0–34.0)
MCHC: 32.9 g/dL (ref 30.0–36.0)
MCHC: 33.8 g/dL (ref 30.0–36.0)
MCV: 94 fL (ref 80.0–100.0)
MCV: 94.7 fL (ref 80.0–100.0)
Monocytes Absolute: 0.2 10*3/uL (ref 0.1–1.0)
Monocytes Absolute: 0.2 10*3/uL (ref 0.1–1.0)
Monocytes Relative: 7 %
Monocytes Relative: 9 %
Neutro Abs: 1.4 10*3/uL — ABNORMAL LOW (ref 1.7–7.7)
Neutro Abs: 2.1 10*3/uL (ref 1.7–7.7)
Neutrophils Relative %: 70 %
Neutrophils Relative %: 70 %
Platelets: 113 10*3/uL — ABNORMAL LOW (ref 150–400)
Platelets: 114 10*3/uL — ABNORMAL LOW (ref 150–400)
RBC: 3.15 MIL/uL — ABNORMAL LOW (ref 3.87–5.11)
RBC: 3.56 MIL/uL — ABNORMAL LOW (ref 3.87–5.11)
RDW: 14.6 % (ref 11.5–15.5)
RDW: 14.6 % (ref 11.5–15.5)
WBC: 1.9 10*3/uL — ABNORMAL LOW (ref 4.0–10.5)
WBC: 3 10*3/uL — ABNORMAL LOW (ref 4.0–10.5)
nRBC: 0 % (ref 0.0–0.2)
nRBC: 0 % (ref 0.0–0.2)

## 2018-12-20 LAB — COMPREHENSIVE METABOLIC PANEL
ALT: 135 U/L — ABNORMAL HIGH (ref 0–44)
ALT: 119 U/L — ABNORMAL HIGH (ref 0–44)
AST: 268 U/L — ABNORMAL HIGH (ref 15–41)
AST: 234 U/L — ABNORMAL HIGH (ref 15–41)
Albumin: 2.3 g/dL — ABNORMAL LOW (ref 3.5–5.0)
Albumin: 2.1 g/dL — ABNORMAL LOW (ref 3.5–5.0)
Alkaline Phosphatase: 81 U/L (ref 38–126)
Alkaline Phosphatase: 75 U/L (ref 38–126)
Anion gap: 8 (ref 5–15)
Anion gap: 6 (ref 5–15)
BUN: 11 mg/dL (ref 8–23)
BUN: 10 mg/dL (ref 8–23)
CO2: 16 mmol/L — ABNORMAL LOW (ref 22–32)
CO2: 17 mmol/L — ABNORMAL LOW (ref 22–32)
Calcium: 8.3 mg/dL — ABNORMAL LOW (ref 8.9–10.3)
Calcium: 7.8 mg/dL — ABNORMAL LOW (ref 8.9–10.3)
Chloride: 104 mmol/L (ref 98–111)
Chloride: 109 mmol/L (ref 98–111)
Creatinine, Ser: 0.83 mg/dL (ref 0.44–1.00)
Creatinine, Ser: 0.81 mg/dL (ref 0.44–1.00)
GFR calc Af Amer: >60 mL/min (ref >60)
GFR calc Af Amer: >60 mL/min (ref >60)
GFR calc non Af Amer: >60 mL/min (ref >60)
GFR calc non Af Amer: >60 mL/min (ref >60)
Glucose, Bld: 89 mg/dL (ref 70–99)
Glucose, Bld: 72 mg/dL (ref 70–99)
Potassium: 3.5 mmol/L (ref 3.5–5.1)
Potassium: 3.4 mmol/L — ABNORMAL LOW (ref 3.5–5.1)
Sodium: 128 mmol/L — ABNORMAL LOW (ref 135–145)
Sodium: 132 mmol/L — ABNORMAL LOW (ref 135–145)
Total Bilirubin: 0.7 mg/dL (ref 0.3–1.2)
Total Bilirubin: 0.6 mg/dL (ref 0.3–1.2)
Total Protein: 5.6 g/dL — ABNORMAL LOW (ref 6.5–8.1)
Total Protein: 4.7 g/dL — ABNORMAL LOW (ref 6.5–8.1)

## 2018-12-20 LAB — FERRITIN: Ferritin: 1758 ng/mL — ABNORMAL HIGH (ref 11–307)

## 2018-12-20 LAB — IRON AND TIBC
Iron: 51 ug/dL (ref 28–170)
Saturation Ratios: 48 % — ABNORMAL HIGH (ref 10.4–31.8)
TIBC: 106 ug/dL — ABNORMAL LOW (ref 250–450)
UIBC: 55 ug/dL

## 2018-12-20 LAB — RETICULOCYTES
Immature Retic Fract: 4.1 % (ref 2.3–15.9)
RBC.: 3.15 MIL/uL — ABNORMAL LOW (ref 3.87–5.11)
Retic Count, Absolute: 28.4 10*3/uL (ref 19.0–186.0)
Retic Ct Pct: 0.9 % (ref 0.4–3.1)

## 2018-12-20 LAB — SARS CORONAVIRUS 2 (TAT 6-24 HRS): SARS Coronavirus 2: NEGATIVE

## 2018-12-20 LAB — LACTIC ACID, PLASMA: Lactic Acid, Venous: 1.4 mmol/L (ref 0.5–1.9)

## 2018-12-20 LAB — LIPASE, BLOOD: Lipase: 123 U/L — ABNORMAL HIGH (ref 11–51)

## 2018-12-20 LAB — FOLATE: Folate: 27.8 ng/mL (ref >5.9)

## 2018-12-20 LAB — GLUCOSE, CAPILLARY
Glucose-Capillary: 63 mg/dL — ABNORMAL LOW (ref 70–99)
Glucose-Capillary: 70 mg/dL (ref 70–99)

## 2018-12-20 LAB — TROPONIN I (HIGH SENSITIVITY)
Troponin I (High Sensitivity): 5 ng/L (ref <18)
Troponin I (High Sensitivity): 4 ng/L (ref <18)

## 2018-12-20 LAB — VITAMIN B12: Vitamin B-12: 435 pg/mL (ref 180–914)

## 2018-12-20 MED ORDER — SODIUM CHLORIDE 0.9% FLUSH
3.0000 mL | Freq: Two times a day (BID) | INTRAVENOUS | Status: DC
  Administered 2018-12-20 – 2019-01-31 (×49): 3 mL via INTRAVENOUS

## 2018-12-20 MED ORDER — SODIUM CHLORIDE 0.9% FLUSH
3.0000 mL | Freq: Two times a day (BID) | INTRAVENOUS | Status: DC
  Administered 2018-12-20 – 2018-12-30 (×8): 3 mL via INTRAVENOUS

## 2018-12-20 MED ORDER — CLOPIDOGREL BISULFATE 75 MG PO TABS
75.0000 mg | ORAL_TABLET | Freq: Every day | ORAL | Status: DC
  Administered 2018-12-20 – 2018-12-24 (×3): 75 mg via ORAL
  Filled 2018-12-20 (×8): qty 1

## 2018-12-20 MED ORDER — FOLIC ACID 1 MG PO TABS
1.0000 mg | ORAL_TABLET | Freq: Every day | ORAL | Status: DC
  Administered 2018-12-20 – 2018-12-28 (×3): 1 mg via ORAL
  Filled 2018-12-20 (×12): qty 1

## 2018-12-20 MED ORDER — ONDANSETRON HCL 4 MG PO TABS: 4.0000 mg | ORAL_TABLET | Freq: Four times a day (QID) | ORAL | Status: DC | PRN

## 2018-12-20 MED ORDER — SODIUM CHLORIDE 0.9 % IV SOLN: 250.0000 mL | INTRAVENOUS | Status: DC | PRN

## 2018-12-20 MED ORDER — SODIUM CHLORIDE 0.9 % IV BOLUS
250.0000 mL | Freq: Once | INTRAVENOUS | Status: AC
  Administered 2018-12-20: 04:00:00 250 mL via INTRAVENOUS

## 2018-12-20 MED ORDER — ASPIRIN EC 81 MG PO TBEC
81.0000 mg | DELAYED_RELEASE_TABLET | Freq: Every day | ORAL | Status: DC
  Administered 2018-12-20 – 2018-12-28 (×4): 81 mg via ORAL
  Filled 2018-12-20 (×12): qty 1

## 2018-12-20 MED ORDER — ONDANSETRON HCL 4 MG/2ML IJ SOLN
4.0000 mg | Freq: Four times a day (QID) | INTRAMUSCULAR | Status: DC | PRN
  Filled 2018-12-20: qty 2

## 2018-12-20 MED ORDER — ACETAMINOPHEN 325 MG PO TABS
650.0000 mg | ORAL_TABLET | Freq: Four times a day (QID) | ORAL | Status: DC | PRN
  Administered 2019-01-05 – 2019-01-11 (×3): 650 mg via ORAL
  Filled 2018-12-20 (×6): qty 2

## 2018-12-20 MED ORDER — ENSURE ENLIVE PO LIQD
237.0000 mL | Freq: Two times a day (BID) | ORAL | Status: DC
  Administered 2018-12-21: 11:00:00 237 mL via ORAL

## 2018-12-20 MED ORDER — GERHARDT'S BUTT CREAM
TOPICAL_CREAM | Freq: Three times a day (TID) | CUTANEOUS | Status: DC
  Administered 2018-12-20 (×3): 1 via TOPICAL
  Administered 2018-12-21: 11:00:00 via TOPICAL
  Administered 2018-12-21: 1 via TOPICAL
  Administered 2018-12-21 – 2018-12-23 (×6): via TOPICAL
  Administered 2018-12-23: 1 via TOPICAL
  Administered 2018-12-24 – 2018-12-30 (×19): via TOPICAL
  Administered 2018-12-31: 1 via TOPICAL
  Administered 2018-12-31 – 2019-01-03 (×11): via TOPICAL
  Administered 2019-01-04: 1 via TOPICAL
  Administered 2019-01-04 – 2019-01-14 (×25): via TOPICAL
  Administered 2019-01-14: 1 via TOPICAL
  Administered 2019-01-15 – 2019-01-17 (×8): via TOPICAL
  Administered 2019-01-18: 1 via TOPICAL
  Administered 2019-01-18 – 2019-01-21 (×9): via TOPICAL
  Administered 2019-01-21: 1 via TOPICAL
  Administered 2019-01-21 – 2019-01-24 (×10): via TOPICAL
  Administered 2019-01-25: 1 via TOPICAL
  Administered 2019-01-25 – 2019-01-31 (×17): via TOPICAL
  Filled 2018-12-20 (×3): qty 1

## 2018-12-20 MED ORDER — PRO-STAT SUGAR FREE PO LIQD
30.0000 mL | Freq: Two times a day (BID) | ORAL | Status: DC
  Administered 2018-12-20 – 2018-12-23 (×4): 30 mL via ORAL
  Filled 2018-12-20 (×14): qty 30

## 2018-12-20 MED ORDER — ACETAMINOPHEN 650 MG RE SUPP
650.0000 mg | Freq: Four times a day (QID) | RECTAL | Status: DC | PRN
  Administered 2019-01-04: 05:00:00 650 mg via RECTAL
  Filled 2018-12-20: qty 1

## 2018-12-20 MED ORDER — ADULT MULTIVITAMIN W/MINERALS CH
1.0000 | ORAL_TABLET | Freq: Every day | ORAL | Status: DC
  Administered 2018-12-20 – 2018-12-28 (×3): 1 via ORAL
  Filled 2018-12-20 (×10): qty 1

## 2018-12-20 MED ORDER — PANTOPRAZOLE SODIUM 20 MG PO TBEC
20.0000 mg | DELAYED_RELEASE_TABLET | Freq: Every day | ORAL | Status: DC
  Administered 2018-12-20 – 2018-12-28 (×3): 20 mg via ORAL
  Filled 2018-12-20 (×12): qty 1

## 2018-12-20 MED ORDER — SODIUM CHLORIDE 0.9 % IV SOLN
INTRAVENOUS | Status: AC
  Administered 2018-12-20: 10:00:00 via INTRAVENOUS

## 2018-12-20 MED ORDER — IOHEXOL 300 MG/ML  SOLN
100.0000 mL | Freq: Once | INTRAMUSCULAR | Status: AC | PRN
  Administered 2018-12-20: 02:00:00 100 mL via INTRAVENOUS

## 2018-12-20 MED ORDER — SODIUM CHLORIDE 0.9% FLUSH: 3.0000 mL | INTRAVENOUS | Status: DC | PRN

## 2018-12-20 NOTE — ED Notes (Signed)
ED TO INPATIENT HANDOFF REPORT  ED Nurse Name and Phone #:  [phone]  S Name/Age/Gender Gay Filler 85 y.o. female Room/Bed: 025C/025C  Code Status   Code Status: Prior  Home/SNF/Other Home Patient oriented to: self and place Is this baseline? Yes   Triage Complete: Triage complete  Chief Complaint weakness hypotension  Triage Note Pt BIB [facility] from home c/o generalized weakness, treated yesterday for a UTI. EMS reports pt initial BP 80palp, dropped to 50 systolic on standing. Pt A&O on arrival to ED, given 500ccNS PTA, BP improved to 120/63.   Allergies Allergies  Allergen Reactions  . Demerol [Meperidine] Other (See Comments)    Excessive sweating, cramping  . Other     No Antibiotic without consultation with her primary physician.  . Codeine Rash  . Sulfa Antibiotics Itching and Other (See Comments)    Reaction unknown  . Tomato Itching    Level of Care/Admitting Diagnosis ED Disposition    ED Disposition Condition [facility] Area: [place] [100100]  Level of Care: Telemetry Medical [104]  I expect the patient will be discharged within 24 hours: Yes  LOW acuity---Tx typically complete <24 hrs---ACUTE conditions typically can be evaluated <24 hours---LABS likely to return to acceptable levels <24 hours---IS near functional baseline---EXPECTED to return to current living arrangement---NOT newly hypoxic: Does not meet criteria for 5C-Observation unit  Covid Evaluation: Asymptomatic Screening Protocol (No Symptoms)  Diagnosis: Generalized weakness [id number]  Admitting Physician: Vianne Bulls [id number]  Attending Physician: Vianne Bulls [id number]  PT Class (Do Not Modify): Observation [104]  PT Acc Code (Do Not Modify): Observation [10022]       B Medical/Surgery History Past Medical History:  Diagnosis Date  . Anemia    years ago  . Arthritis    "left hand" (02/17/2013)  . C. difficile diarrhea   . Glaucoma    . High cholesterol    "on RX years ago" (02/17/2013)  . Hypertension   . Meningioma ([place]) 05/01/2015  . Osteoporosis 02/2014   T score -2.5  followed by Dr. Lysle Rubens  . Palpitations    PACs, PVCs and short runs of atrial tachycardia on Holter monitoring  . TIA (transient ischemic attack) 1990's   Past Surgical History:  Procedure Laterality Date  . ANAL FISTULECTOMY  1970  . COLONOSCOPY    . COLONOSCOPY WITH PROPOFOL N/A 12/02/2016   Procedure: COLONOSCOPY WITH PROPOFOL;  Surgeon: Gatha Mayer, MD;  Location: [facility] ENDOSCOPY;  Service: Endoscopy;  Laterality: N/A;  . FECAL TRANSPLANT N/A 12/02/2016   Procedure: FECAL TRANSPLANT;  Surgeon: Gatha Mayer, MD;  Location: [facility] ENDOSCOPY;  Service: Endoscopy;  Laterality: N/A;  . TONSILLECTOMY       A IV Location/Drains/Wounds Patient Lines/Drains/Airways Status   Active Line/Drains/Airways    Name:   Placement date:   Placement time:   Site:   Days:   Peripheral IV 12/19/18 Anterior;Left Forearm   12/19/18    2309    Forearm   1   Peripheral IV 12/20/18 Right Forearm   12/20/18    0015    Forearm   less than 1   External Urinary Catheter   12/16/18    1630    -   4   Pressure Injury 12/16/18 Coccyx Mid Stage II -  Partial thickness loss of dermis presenting as a shallow open ulcer with a red, pink wound bed without slough.   12/16/18    1123  4          Intake/Output Last 24 hours  Intake/Output Summary (Last 24 hours) at 12/20/2018 0434 Last data filed at 12/20/2018 0126 Gross per 24 hour  Intake 500 ml  Output -  Net 500 ml    Labs/Imaging Results for orders placed or performed during the hospital encounter of 12/19/18 (from the past 48 hour(s))  CBC with Differential/Platelet     Status: Abnormal   Collection Time: 12/20/18 12:12 AM  Result Value Ref Range   WBC 3.0 (L) 4.0 - 10.5 K/uL   RBC 3.56 (L) 3.87 - 5.11 MIL/uL   Hemoglobin 11.1 (L) 12.0 - 15.0 g/dL   HCT 33.7 (L) 36.0 - 46.0 %   MCV 94.7 80.0 - 100.0 fL   MCH  31.2 26.0 - 34.0 pg   MCHC 32.9 30.0 - 36.0 g/dL   RDW 14.6 11.5 - 15.5 %   Platelets 113 (L) 150 - 400 K/uL    Comment: REPEATED TO VERIFY PLATELET COUNT CONFIRMED BY SMEAR Immature Platelet Fraction may be clinically indicated, consider ordering this additional test [id number]    nRBC 0.0 0.0 - 0.2 %   Neutrophils Relative % 70 %   Neutro Abs 2.1 1.7 - 7.7 K/uL   Lymphocytes Relative 18 %   Lymphs Abs 0.5 (L) 0.7 - 4.0 K/uL   Monocytes Relative 7 %   Monocytes Absolute 0.2 0.1 - 1.0 K/uL   Eosinophils Relative 4 %   Eosinophils Absolute 0.1 0.0 - 0.5 K/uL   Basophils Relative 0 %   Basophils Absolute 0.0 0.0 - 0.1 K/uL   Immature Granulocytes 1 %   Abs Immature Granulocytes 0.02 0.00 - 0.07 K/uL    Comment: Performed at [facility], [address] [place], [place] [address]  Comprehensive metabolic panel     Status: Abnormal   Collection Time: 12/20/18 12:12 AM  Result Value Ref Range   Sodium 128 (L) 135 - 145 mmol/L   Potassium 3.5 3.5 - 5.1 mmol/L   Chloride 104 98 - 111 mmol/L   CO2 16 (L) 22 - 32 mmol/L   Glucose, Bld 89 70 - 99 mg/dL   BUN 11 8 - 23 mg/dL   Creatinine, Ser 0.83 0.44 - 1.00 mg/dL   Calcium 8.3 (L) 8.9 - 10.3 mg/dL   Total Protein 5.6 (L) 6.5 - 8.1 g/dL   Albumin 2.3 (L) 3.5 - 5.0 g/dL   AST 268 (H) 15 - 41 U/L   ALT 135 (H) 0 - 44 U/L   Alkaline Phosphatase 81 38 - 126 U/L   Total Bilirubin 0.7 0.3 - 1.2 mg/dL   GFR calc non Af Amer >60 >60 mL/min   GFR calc Af Amer >60 >60 mL/min   Anion gap 8 5 - 15    Comment: Performed at [facility], [place] [address], [place],  [address]  Troponin I (High Sensitivity)     Status: None   Collection Time: 12/20/18 12:12 AM  Result Value Ref Range   Troponin I (High Sensitivity) 5 <18 ng/L    Comment: (NOTE) Elevated high sensitivity troponin I (hsTnI) values and significant  changes across serial measurements may suggest ACS but many other  chronic and acute conditions are known to  elevate hsTnI results.  Refer to the "Links" section for chest pain algorithms and additional  guidance. Performed at [facility], [place] [address], [place], [place] [address]   Lactic acid, plasma  Status: None   Collection Time: 12/20/18 12:12 AM  Result Value Ref Range   Lactic Acid, Venous 1.4 0.5 - 1.9 mmol/L    Comment: Performed at [facility] [address], [place], [place] [address]  Lipase, blood     Status: Abnormal   Collection Time: 12/20/18 12:12 AM  Result Value Ref Range   Lipase 123 (H) 11 - 51 U/L    Comment: Performed at [facility] [address] [place], [place] [address]  Troponin I (High Sensitivity)     Status: None   Collection Time: 12/20/18  2:17 AM  Result Value Ref Range   Troponin I (High Sensitivity) 4 <18 ng/L    Comment: (NOTE) Elevated high sensitivity troponin I (hsTnI) values and significant  changes across serial measurements may suggest ACS but many other  chronic and acute conditions are known to elevate hsTnI results.  Refer to the "Links" section for chest pain algorithms and additional  guidance. Performed at [facility], [place] [address], [place], [place] [address]   Urinalysis, Routine w reflex microscopic     Status: Abnormal   Collection Time: 12/20/18  3:53 AM  Result Value Ref Range   Color, Urine STRAW (A) YELLOW   APPearance CLEAR CLEAR   Specific Gravity, Urine 1.008 1.005 - 1.030   pH 6.0 5.0 - 8.0   Glucose, UA NEGATIVE NEGATIVE mg/dL   Hgb urine dipstick SMALL (A) NEGATIVE   Bilirubin Urine NEGATIVE NEGATIVE   Ketones, ur NEGATIVE NEGATIVE mg/dL   Protein, ur NEGATIVE NEGATIVE mg/dL   Nitrite NEGATIVE NEGATIVE   Leukocytes,Ua NEGATIVE NEGATIVE   RBC / HPF 0-5 0 - 5 RBC/hpf   WBC, UA 0-5 0 - 5 WBC/hpf   Bacteria, UA RARE (A) NONE SEEN    Comment: Performed at [facility] [address], [place], [place] [address]   Ct Abdomen Pelvis W Contrast  Result Date: 12/20/2018 CLINICAL  DATA:  Abnormal LFTs, generalized weakness EXAM: CT ABDOMEN AND PELVIS WITH CONTRAST TECHNIQUE: Multidetector CT imaging of the abdomen and pelvis was performed using the standard protocol following bolus administration of intravenous contrast. CONTRAST:  152mL OMNIPAQUE IOHEXOL 300 MG/ML  SOLN COMPARISON:  CT 10/17/2016 FINDINGS: Lower chest: Lung bases demonstrate small bilateral pleural effusions and passive atelectasis within the posterior lower lobes. The heart appears slightly enlarged. Hepatobiliary: Central hepatic cyst. No calcified gallstone or biliary dilatation Pancreas: Unremarkable. No pancreatic ductal dilatation or surrounding inflammatory changes. Spleen: Normal in size without focal abnormality. Adrenals/Urinary Tract: Adrenal glands are unremarkable. Kidneys are normal, without renal calculi, focal lesion, or hydronephrosis. Bladder is unremarkable. Stomach/Bowel: Stomach is nonenlarged. No dilated small bowel. Fluid-filled colon without bowel wall thickening. Appendix not clearly identified but no right lower quadrant inflammatory process. Vascular/Lymphatic: Nonaneurysmal aorta. Scattered aortic atherosclerosis. No significantly enlarged lymph nodes Reproductive: Calcified uterine fibroids.  No adnexal mass Other: No free air. Probable small amount of free fluid in the pelvis Musculoskeletal: Trace anterolisthesis L3 on L4. Degenerative changes at L3-L4 and L4-L5 IMPRESSION: 1. No CT evident for acute intra-abdominal or pelvic abnormality. Diffuse fluid within the colon suggesting possible enteritis or diarrheal illness 2. Small bilateral pleural effusions with passive atelectasis at the bases 3. Uterine fibroids Electronically Signed   By: Donavan Foil M.D.   On: 12/20/2018 02:28   Dg Abdomen Acute W/chest  Result Date: 12/20/2018 CLINICAL DATA:  85 year old female with generalized weakness. EXAM: DG ABDOMEN ACUTE W/ 1V CHEST  COMPARISON:  CT of the abdomen pelvis dated 10/18/2016 FINDINGS:  Probable background of emphysema. Trace right pleural effusion with associated atelectasis. No focal consolidation or pneumothorax. Mild cardiomegaly. Atherosclerotic calcification of the aorta. There is no bowel dilatation or evidence of obstruction. No free air. Probable calcified fibroid in the pelvis. Degenerative changes of the spine and hips. The soft tissues are unremarkable. IMPRESSION: 1. Trace right pleural effusion with associated atelectasis. 2. No bowel obstruction. Electronically Signed   By: Anner Crete M.D.   On: 12/20/2018 00:05    Pending Labs Unresulted Labs (From admission, onward)    Start     Ordered   12/20/18 0500  Vitamin B12  (Anemia Panel (PNL))  Tomorrow morning,   R     12/20/18 0254   12/20/18 0500  Folate  (Anemia Panel (PNL))  Tomorrow morning,   R     12/20/18 0254   12/20/18 0500  Iron and TIBC  (Anemia Panel (PNL))  Tomorrow morning,   R     12/20/18 0254   12/20/18 0500  Ferritin  (Anemia Panel (PNL))  Tomorrow morning,   R     12/20/18 0254   12/20/18 0500  Reticulocytes  (Anemia Panel (PNL))  Tomorrow morning,   R     12/20/18 0254   12/20/18 0235  SARS CORONAVIRUS 2 (TAT 6-12 HRS) Nasal Swab Aptima Multi Swab  (Asymptomatic/Tier 2 Patients Labs)  Once,   STAT    Question Answer Comment  Is this test for diagnosis or screening Screening   Symptomatic for COVID-19 as defined by CDC No   Hospitalized for COVID-19 No   Admitted to ICU for COVID-19 No   Previously tested for COVID-19 Yes   Resident in a congregate (group) care setting No   Employed in healthcare setting No   Pregnant No      12/20/18 0234   12/19/18 2329  Blood culture (routine x 2)  BLOOD CULTURE X 2,   STAT     12/19/18 2329   12/19/18 2328  Urine Culture  Once,   STAT     12/19/18 2328   Signed and Held  Comprehensive metabolic panel  Tomorrow morning,   R     Signed and Held   Signed and Held  CBC WITH DIFFERENTIAL  Tomorrow morning,   R     Signed and Held           Vitals/Pain Today's Vitals   12/20/18 0315 12/20/18 0330 12/20/18 0345 12/20/18 0400  BP: (!) 104/57 (!) 116/58 (!) 104/50 (!) 112/52  Pulse:  77  72  Resp: 17 (!) 23 17 (!) 26  Temp:      TempSrc:      SpO2:  96%  95%  PainSc:    0-No pain    Isolation Precautions No active isolations  Medications Medications  0.9 %  sodium chloride infusion (has no administration in time range)  ondansetron (ZOFRAN) tablet 4 mg (has no administration in time range)    Or  ondansetron (ZOFRAN) injection 4 mg (has no administration in time range)  sodium chloride 0.9 % bolus 500 mL (0 mLs Intravenous Stopped 12/20/18 0126)  iohexol (OMNIPAQUE) 300 MG/ML solution 100 mL (100 mLs Intravenous Contrast Given 12/20/18 0151)  sodium chloride 0.9 % bolus 250 mL (250 mLs Intravenous New Bag/Given 12/20/18 0408)    Mobility non-ambulatory Low fall risk   Focused Assessments Neuro Assessment Handoff:  Swallow screen pass? N/A Cardiac Rhythm: Normal  sinus rhythm       Neuro Assessment:   Neuro Checks:      Last Documented NIHSS Modified Score:   Has TPA been given? No If patient is a Neuro Trauma and patient is going to OR before floor call report to [facility] nurse: [phone] or [phone]     R Recommendations: See Admitting Provider Note  Report given to:   Additional Notes:  Lives with daughter

## 2018-12-20 NOTE — Telephone Encounter (Signed)
Thank you :)

## 2018-12-20 NOTE — Plan of Care (Signed)

## 2018-12-20 NOTE — Evaluation (Signed)
Physical Therapy Evaluation Patient Details Name: Sydney [name] MRN: [id number] DOB: November 21, 1930 Today's Date: 12/20/2018   History of Present Illness  Sydney [name] is a 85 y.o. 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  Clinical Impression  Pt admitted with above diagnosis. Pt with profound fatigue and weakness bilaterally, hip flex 2/5, knees 3-/5. Pt has gone from walking the mall with her daughter including the stairs 2-3x/wk before covid to being unable to walk her house or tolerate standing to cook, which she previously loved and did everyday. She also stayed alone 12+ hrs/ day before covid and now cannot safely get out of bed. Pt had difficulty initiating movement and needed increased time to follow all commands. Also of note, she cleared her throat frequently after sipping water, SLP consult would be helpful and possibly neuro as well. Gait pattern is slow and step length has diminished to half a foot length with pt having difficulty maintaining forward motion.  Pt currently with functional limitations due to the deficits listed below (see PT Problem List). Pt will benefit from skilled PT to increase their independence and safety with mobility to allow discharge to the venue listed below.       Follow Up Recommendations Home health PT;Supervision/Assistance - 24 hour    Equipment Recommendations  None recommended by PT    Recommendations for Other Services OT consult;Speech consult     Precautions / Restrictions Precautions Precautions: Fall Precaution Comments: had near syncopal episode on [facility] at home before coming in Restrictions Weight Bearing Restrictions: No      Mobility  Bed Mobility Overal bed  mobility: Needs Assistance Bed Mobility: Supine to Sit;Sit to Supine     Supine to sit: Min assist Sit to supine: Mod assist   General bed mobility comments: min A to initiate movement to EOB and HHA for elevation of trunk to sitting. Mod A for LE's into bed with sit>supine  Transfers Overall transfer level: Needs assistance Equipment used: Rolling walker (2 wheeled) Transfers: Sit to/from Stand Sit to Stand: Min guard         General transfer comment: pt needed increased time and vc's for hand placement. Stood very slowly to [employer]  Ambulation/Gait Ambulation/Gait assistance: Herbalist (Feet): 15 Feet Assistive device: Rolling walker (2 wheeled) Gait Pattern/deviations: Step-through pattern;Narrow base of support;Decreased stride length;Shuffle Gait velocity: decreased Gait velocity interpretation: <1.31 ft/sec, indicative of household ambulator General Gait Details: pt had some difficulty initiating stepping, needed cues for increasing step length and staying up in RW. Step length only half distance of opposite foot.   Stairs            Wheelchair Mobility    Modified Rankin (Stroke Patients Only) Modified Rankin (Stroke Patients Only) Pre-Morbid Rankin Score: No significant disability Modified Rankin: Moderately severe disability     Balance Overall balance assessment: Needs assistance Sitting-balance support: Single extremity supported Sitting balance-Leahy Scale: Fair     Standing balance support: No upper extremity supported Standing balance-Leahy Scale: Poor Standing balance comment: pt with increased reaction time and decreased ability to compenste for LOB                             Pertinent Vitals/Pain Pain  Assessment: No/denies pain    Home Living Family/patient expects to be discharged to:: Private residence Living Arrangements: Children Available Help at Discharge: Family;Available 24 hours/day Type of Home:  House Home Access: Stairs to enter Entrance Stairs-Rails: Psychiatric nurse of Steps: 4 Home Layout: One level Home Equipment: Grab bars - tub/shower Additional Comments: daughter is currently working from home but prior to covid worked out of town and pt was home 12+ hrs a day on her own    Prior Function Level of Independence: Needs assistance   Gait / Transfers Assistance Needed: prior to past month was independent with no AD and walked with daughter daily. Daughter reports is now shuffling with RW and supervision  ADL's / Homemaking Assistance Needed: was independent but is no longer cooking because of fatigue and needs some assist with ADL's  Comments: very independent with ADLs, IADLs and mobility     Hand Dominance   Dominant Hand: Right    Extremity/Trunk Assessment   Upper Extremity Assessment Upper Extremity Assessment: Generalized weakness    Lower Extremity Assessment Lower Extremity Assessment: Generalized weakness;RLE deficits/detail;LLE deficits/detail RLE Deficits / Details: hip flex 2/5, knee ext 3-/5 ankle grossly 3/5 RLE Sensation: WNL RLE Coordination: decreased gross motor LLE Deficits / Details: hip flex 2/5, knee ext 3-/5, ankle grossly 3/5 LLE Sensation: WNL LLE Coordination: decreased gross motor    Cervical / Trunk Assessment Cervical / Trunk Assessment: Normal  Communication   Communication: HOH  Cognition Arousal/Alertness: Lethargic Behavior During Therapy: WFL for tasks assessed/performed Overall Cognitive Status: Impaired/Different from baseline Area of Impairment: Following commands;Problem solving                       Following Commands: Follows one step commands with increased time;Follows multi-step commands with increased time     Problem Solving: Slow processing;Decreased initiation;Requires verbal cues;Requires tactile cues General Comments: pt appropriate when answering questions but does not speak  unless spoken to first. Daughter reports that over past 2 weeks pt has become much slower and with decreased conversation. Has some difficulty initiating movement and follows commands with increased time      General Comments General comments (skin integrity, edema, etc.): pt went from taking walks at mall 2-3x/week including stairs to sitting almost all day and not having the energy to cook which she previously did every day for hours.     Exercises     Assessment/Plan    PT Assessment Patient needs continued PT services  PT Problem List Decreased strength;Decreased activity tolerance;Decreased balance;Decreased mobility;Decreased knowledge of use of DME       PT Treatment Interventions DME instruction;Gait training;Stair training;Functional mobility training;Therapeutic activities;Therapeutic exercise;Balance training;Neuromuscular re-education;Patient/family education    PT Goals (Current goals can be found in the Care Plan section)  Acute Rehab PT Goals Patient Stated Goal: none stated but daughter wants to find out what is causing her mother's weakness/ fatigue PT Goal Formulation: With patient Time For Goal Achievement: 01/03/19 Potential to Achieve Goals: Good    Frequency Min 3X/week   Barriers to discharge        Co-evaluation               AM-PAC PT "6 Clicks" Mobility  Outcome Measure Help needed turning from your back to your side while in a flat bed without using bedrails?: A Little Help needed moving from lying on your back to sitting on the side of a flat bed without using bedrails?: A Little Help  needed moving to and from a bed to a chair (including a wheelchair)?: A Little Help needed standing up from a chair using your arms (e.g., wheelchair or bedside chair)?: A Little Help needed to walk in hospital room?: A Little Help needed climbing 3-5 steps with a railing? : A Lot 6 Click Score: 17    End of Session Equipment Utilized During Treatment: Gait  belt Activity Tolerance: Patient limited by fatigue Patient left: in bed;with call bell/phone within reach;with bed alarm set;with family/visitor present Nurse Communication: Mobility status PT Visit Diagnosis: Muscle weakness (generalized) (M62.81);Unsteadiness on feet (R26.81);Adult, failure to thrive (R62.7);Difficulty in walking, not elsewhere classified (R26.2)    Time: [id number] PT Time Calculation (min) (ACUTE ONLY): 37 min   Charges:   PT Evaluation $PT Eval Moderate Complexity: 1 Mod PT Treatments $Gait Training: 8-22 mins        Leighton Roach, PT  Acute Rehab Services  Pager [phone] Office [place] 12/20/2018, 4:33 PM

## 2018-12-20 NOTE — Consult Note (Addendum)
[facility] Nurse wound consult note Consult requested for sacrum/coccyx Pt is familiar to [facility] team from recent admission;refer to progress notes on 8/22.  She was discharged and has returned.  [facility] team is working remotely today and consult was performed after review of the EMR.   Measurements and locations obtained from the nursing flowsheet. Reason for Consult: Stage 2 pressure injuries Pressure Injury POA: Yes Measurement: Coccyx 1X1X.1cm Sacrum .8X.7X.1cm Pt has been having frequent incontinent stools and it would become trapped beneath a dressing.   Topical treatment orders provided for bedside nurse to apply Gerhardts barrier cream to sacrum/buttocks TID and with each incontinent cleaning session to protect skin and repel moisture.  Please re-consult if further assistance is needed.  Thank-you,  Julien Girt MSN, [place], [place], [place], [place]

## 2018-12-20 NOTE — Progress Notes (Signed)
Hypoglycemic Event  CBG: 63  Treatment: 4 oz juice given  Symptoms: pt asymptomatic  Follow-up CBG Time: 0850 CBG Result: 70  Possible Reasons for Event: unknown  Comments/MD notified: Darliss Cheney, MD paged. No new orders at this time.  Will continue to monitor.     Racheal Patches, RN

## 2018-12-20 NOTE — Consult Note (Signed)
[facility]    Date of Admission:  12/19/2018          Reason for Consult: Generalized weakness and a history of recurrent C. difficile colitis     Assessment: Her daughter is very concerned that the recent antibiotic therapy might trigger a relapse of her colitis.  She had an abdominal CT scan which showed fluid in her colon but no bowel thickening to suggest colitis.  She is not having any diarrhea.  I do not think that infection is the cause of her weakness.  Plan: 1. Observe off antibiotics  Principal Problem:   Generalized weakness Active Problems:   Pancytopenia (HCC)   Hyponatremia   Protein calorie malnutrition (HCC)   History of TIA (transient ischemic attack)   Pressure injury of skin   Transaminitis   Malnutrition of moderate degree   Scheduled Meds: . aspirin EC  81 mg Oral Daily  . clopidogrel  75 mg Oral Daily  . feeding supplement (ENSURE ENLIVE)  237 mL Oral BID BM  . feeding supplement (PRO-STAT SUGAR FREE 64)  30 mL Oral BID WC  . folic acid  1 mg Oral Daily  . Gerhardt's butt cream   Topical TID  . multivitamin with minerals  1 tablet Oral Daily  . pantoprazole  20 mg Oral Daily  . sodium chloride flush  3 mL Intravenous Q12H  . sodium chloride flush  3 mL Intravenous Q12H   Continuous Infusions: . sodium chloride     PRN Meds:.sodium chloride, acetaminophen **OR** acetaminophen, ondansetron **OR** ondansetron (ZOFRAN) IV, sodium chloride flush  HPI: Sydney [name] is a 85 y.o. female history of severe, recurrent C. difficile colitis.  She had a fecal transplant in 2018.  She has not had any further episodes of diarrhea since that time.  Her daughter says that she has been eating less and less since June.  Mother say that she is full after only a few bites of food.  Normally she is very active in independent.  She believes that her mother has been losing some weight.  Her sister died in 06-Oct-2022 and she has wondered if  her mother is suffering from depression.    Noted that she had some blood in her underwear that she was having rectal bleeding.  Asked her neurologist who felt that she probably had hemorrhoids.  She was started on suppositories and the bleeding stopped.  Over the last week she has gotten extremely weak and fatigued.  She was seen in the emergency department on 12/13/2018 on 12/15/2018.  She was given IV fluids each time and sent home.  She came back to the ED and was admitted to the hospital on 12/11/2018.  She was afebrile.  She did not have any urinary symptoms but was treated for presumed UTI after her urine culture positive for Klebsiella.  She received 2 doses of ceftriaxone and was discharged with 1 day of cephalexin which she took.  She came back to the hospital because of syncope while sitting on the toilet.  She remains afebrile.  She had one episode of nausea and vomiting yesterday.  She has not had any diarrhea or abdominal pain..   Review of Systems: Review of Systems  Constitutional: Positive for malaise/fatigue and weight loss. Negative for chills, diaphoresis and fever.  Respiratory: Negative for cough, sputum production and shortness of breath.   Cardiovascular: Negative for chest pain.  Gastrointestinal: Positive for  blood in stool, nausea and vomiting. Negative for abdominal pain, constipation and diarrhea.  Genitourinary: Negative for dysuria.  Musculoskeletal: Negative for joint pain and myalgias.  Neurological: Positive for weakness. Negative for headaches.    Past Medical History:  Diagnosis Date  . Anemia    years ago  . Arthritis    "left hand" (02/17/2013)  . C. difficile diarrhea   . Glaucoma   . High cholesterol    "on RX years ago" (02/17/2013)  . Hypertension   . Meningioma ([place]) 05/01/2015  . Osteoporosis 02/2014   T score -2.5  followed by Dr. Lysle Rubens  . Palpitations    PACs, PVCs and short runs of atrial tachycardia on Holter monitoring  . TIA (transient  ischemic attack) 1990's    Social History   Tobacco Use  . Smoking status: Never Smoker  . Smokeless tobacco: Never Used  Substance Use Topics  . Alcohol use: No  . Drug use: No    Family History  Problem Relation Age of Onset  . Hypertension Father   . Heart attack Father   . Stroke Brother   . Stroke Maternal Grandmother    Allergies  Allergen Reactions  . Demerol [Meperidine] Other (See Comments)    Excessive sweating, cramping  . Other     No Antibiotic without consultation with her primary physician.  . Codeine Rash  . Sulfa Antibiotics Itching and Other (See Comments)    Reaction unknown  . Tomato Itching    OBJECTIVE: Blood pressure (!) 107/58, pulse 60, temperature 98.9 F (37.2 C), temperature source Oral, resp. rate 17, height 5\' 3"  (1.6 m), weight 47.9 kg, SpO2 100 %.  Physical Exam Constitutional:      Comments: She is sitting up in a chair.  She appears very weak.  She remains lethargic but answers all questions appropriately. Her daughter is present and does most of the talking.  Cardiovascular:     Rate and Rhythm: Normal rate and regular rhythm.     Heart sounds: No murmur.  Pulmonary:     Effort: Pulmonary effort is normal.     Breath sounds: Normal breath sounds.  Abdominal:     Palpations: Abdomen is soft.     Tenderness: There is no abdominal tenderness.     Lab Results Lab Results  Component Value Date   WBC 1.9 (L) 12/20/2018   HGB 10.0 (L) 12/20/2018   HCT 29.6 (L) 12/20/2018   MCV 94.0 12/20/2018   PLT 114 (L) 12/20/2018    Lab Results  Component Value Date   CREATININE 0.81 12/20/2018   BUN 10 12/20/2018   NA 132 (L) 12/20/2018   K 3.4 (L) 12/20/2018   CL 109 12/20/2018   CO2 17 (L) 12/20/2018    Lab Results  Component Value Date   ALT 119 (H) 12/20/2018   AST 234 (H) 12/20/2018   ALKPHOS 75 12/20/2018   BILITOT 0.6 12/20/2018     Microbiology: Recent Results (from the past 240 hour(s))  Urine culture     Status:  Abnormal   Collection Time: 12/15/18  8:11 AM   Specimen: Urine, Random  Result Value Ref Range Status   Specimen Description URINE, RANDOM  Final   Special Requests NONE  Final   Culture (A)  Final    <10,000 COLONIES/mL INSIGNIFICANT GROWTH Performed at [facility], [address], [place], [place] [address]    Report Status 12/16/2018 FINAL  Final  SARS Coronavirus 2 [facility] order,  Performed in [facility] hospital lab) Nasopharyngeal Nasopharyngeal Swab     Status: None   Collection Time: 12/15/18  8:11 AM   Specimen: Nasopharyngeal Swab  Result Value Ref Range Status   SARS Coronavirus 2 NEGATIVE NEGATIVE Final    Comment: (NOTE) If result is NEGATIVE SARS-CoV-2 target nucleic acids are NOT DETECTED. The SARS-CoV-2 RNA is generally detectable in upper and lower  respiratory specimens during the acute phase of infection. The lowest  concentration of SARS-CoV-2 viral copies this assay can detect is 250  copies / mL. A negative result does not preclude SARS-CoV-2 infection  and should not be used as the sole basis for treatment or other  patient management decisions.  A negative result may occur with  improper specimen collection / handling, submission of specimen other  than nasopharyngeal swab, presence of viral mutation(s) within the  areas targeted by this assay, and inadequate number of viral copies  (<250 copies / mL). A negative result must be combined with clinical  observations, patient history, and epidemiological information. If result is POSITIVE SARS-CoV-2 target nucleic acids are DETECTED. The SARS-CoV-2 RNA is generally detectable in upper and lower  respiratory specimens dur ing the acute phase of infection.  Positive  results are indicative of active infection with SARS-CoV-2.  Clinical  correlation with patient history and other diagnostic information is  necessary to determine patient infection status.  Positive results do  not rule out bacterial  infection or co-infection with other viruses. If result is PRESUMPTIVE POSTIVE SARS-CoV-2 nucleic acids MAY BE PRESENT.   A presumptive positive result was obtained on the submitted specimen  and confirmed on repeat testing.  While 2019 novel coronavirus  (SARS-CoV-2) nucleic acids may be present in the submitted sample  additional confirmatory testing may be necessary for epidemiological  and / or clinical management purposes  to differentiate between  SARS-CoV-2 and other Sarbecovirus currently known to infect humans.  If clinically indicated additional testing with an alternate test  methodology [phone]) is advised. The SARS-CoV-2 RNA is generally  detectable in upper and lower respiratory sp ecimens during the acute  phase of infection. The expected result is Negative. Fact Sheet for Patients:  StrictlyIdeas.no Fact Sheet for Healthcare Providers: BankingDealers.co.za This test is not yet approved or cleared by the [place] FDA and has been authorized for detection and/or diagnosis of SARS-CoV-2 by FDA under an Emergency Use Authorization (EUA).  This EUA will remain in effect (meaning this test can be used) for the duration of the COVID-19 declaration under Section 564(b)(1) of the Act, 21 U.S.C. section 360bbb-3(b)(1), unless the authorization is terminated or revoked sooner. Performed at [facility], [place] [address] [place], [place] [address]   Urine Culture     Status: Abnormal   Collection Time: 12/16/18  1:32 PM   Specimen: Urine, Random  Result Value Ref Range Status   Specimen Description URINE, RANDOM  Final   Special Requests   Final    NONE Performed at [facility], [place] [address], [place],  [address]    Culture >=100,000 COLONIES/mL KLEBSIELLA PNEUMONIAE (A)  Final   Report Status 12/20/2018 FINAL  Final   Organism ID, Bacteria KLEBSIELLA PNEUMONIAE (A)  Final      Susceptibility    Klebsiella pneumoniae - MIC*    AMPICILLIN RESISTANT Resistant     CEFAZOLIN <=4 SENSITIVE Sensitive     CEFTRIAXONE <=1 SENSITIVE Sensitive     CIPROFLOXACIN <=0.25 SENSITIVE Sensitive  GENTAMICIN <=1 SENSITIVE Sensitive     IMIPENEM <=0.25 SENSITIVE Sensitive     NITROFURANTOIN <=16 SENSITIVE Sensitive     TRIMETH/SULFA <=20 SENSITIVE Sensitive     AMPICILLIN/SULBACTAM 4 SENSITIVE Sensitive     PIP/TAZO <=4 SENSITIVE Sensitive     Extended ESBL NEGATIVE Sensitive     * >=100,000 COLONIES/mL KLEBSIELLA PNEUMONIAE  SARS CORONAVIRUS 2 (TAT 6-12 HRS) Nasal Swab Aptima Multi Swab     Status: None   Collection Time: 12/20/18  3:52 AM   Specimen: Aptima Multi Swab; Nasal Swab  Result Value Ref Range Status   SARS Coronavirus 2 NEGATIVE NEGATIVE Final    Comment: (NOTE) SARS-CoV-2 target nucleic acids are NOT DETECTED. The SARS-CoV-2 RNA is generally detectable in upper and lower respiratory specimens during the acute phase of infection. Negative results do not preclude SARS-CoV-2 infection, do not rule out co-infections with other pathogens, and should not be used as the sole basis for treatment or other patient management decisions. Negative results must be combined with clinical observations, patient history, and epidemiological information. The expected result is Negative. Fact Sheet for Patients: SugarRoll.be Fact Sheet for Healthcare Providers: https://www.woods-mathews.com/ This test is not yet approved or cleared by the [place] FDA and  has been authorized for detection and/or diagnosis of SARS-CoV-2 by FDA under an Emergency Use Authorization (EUA). This EUA will remain  in effect (meaning this test can be used) for the duration of the COVID-19 declaration under Section 56 4(b)(1) of the Act, 21 U.S.C. section 360bbb-3(b)(1), unless the authorization is terminated or revoked sooner. Performed at [facility], [place] [address] [place], [place] [address]     Michel Bickers, [facility] [phone] pager   [phone] cell 12/20/2018, 1:19 PM

## 2018-12-20 NOTE — Progress Notes (Addendum)
Patient admitted after midnight with generalized weakness and possible UTI. Work up reveals orthostatic hypotension, pancytopenia, hyponatremia. Thought to be related to dehydration/hypovolemia given hx of cdiff and persistent stools s/p stool transplant and decreased oral intake.  Provided with IV fluids.  Will have PT eval   1. Generalized weakness  - Presented with generalized weakness and pre-syncope, found to be hypotensive with EMS and received 500 cc NS prior to arrival in ED. No focal weakness . Recently treated UTI. No s/sx infectious process. Likely related dehydration/hypovolemia rather than sepsis. CK and TSH were normal a few days ago. No growth blood cultures to date. CT abdomen with  no evidence acute intra-abdominal or pelvic abnormality but with diffuse fluid within colon suggesting possible enteritis or diarrheal illness. She is afebrile and non-toxic appearing.  - Continue IVF hydration, -stool studies -ID consult  - PT and dietary consultations     2. ?UTI  - Culture from 12/16/18 growing >100k cfu GNR's  - She was treated with Rocephin for two days in hospital and then took Keflex at home on 8/24.  There is no fever, lactic acid is normal, and she denies any urinary complaints or flank pain  - She denies urinary sxs and with C difficile history, -no antibiotics for now     3. Pancytopenia  - WBC and Hg trending down. Platelet count stable but down from last week.  No active bleeding, she is at high-risk for nutrient deficiency, meyelodysplasia a concern, possibly related to infection. Anemia panel with retic 3.15, ferritin 1758, tibc 105, folate and B12 within limits of normal -monitor  4. Hyponatremia   - Serum sodium is 128 in ED and 132 this am. Related to  Hypovolemia. She was given 500 cc NS with EMS and another 500 cc NS in ED  - Continue IVF hydration - repeat chem panel in am   5. Protein-calorie malnutrition  - Prealbumin <5 earlier this month  - She has  not been eating much at all despite encouragement from daughter but denies abdominal pain or nausea  - Encourage to eat, offer supplements, continue GI follow-up on discharge    6. Elevated transaminases - AST is 268 and ALT 135 in ED, increased from recent priors. Chart review indicates chronic component.  Abd exam is benign  - She had been seen by GI in the hospital a few days ago and viral hepatitis panel was sent and is negative. Lipase elevated at 123.  - Continue supportive care  -will request GI consult  7. History of TIA  - Continue ASA, Plavix     8. Skin breakdown. Hx sacral decub. Stage 1. No odor -woc consult.  Santiago Glad , np

## 2018-12-20 NOTE — Consult Note (Addendum)
[facility] Consult: 1:07 PM 12/20/2018  LOS: 0 days    Referring Provider: Dr. Doristine Bosworth Primary Care Physician:  [name], MD Primary Gastroenterologist:  Dr. Carlean Purl     Reason for Consultation: Fluid in colon on CT, elevated LFTs.   HPI: Sydney [name] is a 85 y.o. female. HPI: Sydney [name] is a 85 y.o. female.  Chronic hyponatremia.  Chronic Plavix, low-dose aspirin for history TIA. 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  Limited views but entire examined colon normal.  FMT transplant performed in the cecum. CT of the pelvis in January 2020 showed benign, stable degeneration in left iliac crest.  Calcified uterine fibroids, bilateral inguinal hernias containing loops of bowel without strangulation.  Aortic atherosclerosis.  12/09/2018 office visit with Dr. Havery Moros regarding minor rectal bleeding.  Complaint of fatigue.  At anoscopy she had inflamed internal hemorrhoids which he felt was the source of bleeding.  Daily fiber supplements recommended and Anusol suppository prescribed.  Hgb was 10.9, sodium 130.  LFTs not obtained. ED visit 8/18 for evaluation of the weakness, fatigue, anorexia, poor p.o. intake present for at least 2 weeks.  Weight reported as 100#, down from baseline of 103-104. Sodium at 130.  Bicarb low at 15.  Leukopenia at 3.2.  Hgb 10.8. She felt better after receiving a 1 L bolus of normal saline.  Declined suggested admission.   Admission 8/20 -12/18/2018 with weakness, rising LFTs.  She had been in touch with GI office, Dr. Carlean Purl and Havery Moros, ultimately her primary care doctor advised her to come to the ED.  Unable to hold herself up.  Limited nausea, queasiness.  No abdominal pain.  No rectal bleeding. Ultimately diagnosed with  Klebsiella UTI, rx Rocephin x2 days 1 additional outpatient day of Keflex.  Dehydration, hyponatremia corrected.  Diagnosed with severe protein calorie malnutrition, prealbumin <5. Inpt GI eval, Dr. Henrene Pastor, 12/16/2018 for weakness, rising LFTs.  With no evidence for impaired liver function, he was not impressed by LFT s.  He felt that she was suffering from general medical illness, possibly viral versus metabolic.  Suggested supportive care.  Acute hepatitis ABC serologies negative.  HCV antibody 0.2.  COVID-19 negative on 8/20 and 8/25.  Returned to the ED via EMS early today.  Daughter found her slumped over the bedside commode, did not sustain a fall or trauma.  Not clear there was any LOC.  SBP 80 down to 50 with standing.  Bolused with IV fluids, BP improved to 120/63 From 8/18 to 8/21 to today labs are as follows: T bili 0.8 >> 1.1 >> 0.6 Alk phos 79 >> 78 >> 75 AST /ALT 127/77 >> 146/74 >> today: 268/135 >> 234/119. Albumin 2.6 >> 2.3 >> 2.1 Last previous LFTs 09/2016, normal. Lipase today 123.   Sodium 130 >> 131 >> 132. Ongoing pancytopenia. Renal function normal. TSH normal.  CTAP with contrast today shows diffuse fluid within the colon suggesting enteritis or diarrheal illness (no diarrhea per dtr).  Cyst in the central liver.  No obstruction or acute  intra-abdominal/pelvic abnormality.  Small bilateral pleural effusions and atelectasis at bases.  Uterine fibroids.  No EtOH.  No new medications other than abx in last few days.  Lives with her daughter.  Normally loves to cook and independent for ADLs but fatigue has made it so she has been unable to cook. Patient stools are "gritty", not black, not bloody.  As an adult she is never had truly formed stools, the have always been either softer somewhat loose.  She tends to move her bowels when she urinates.  She has not been urinating as much as normal since she discharged from the hospital.  When she first left the hospital on Sunday, and  then on Monday she was able to walk with only a shuffling, slow gait.  When she walks or even if she stands it takes a lot of effort.  She has eaten maybe 10% of the meals her daughter offers her.  Does not complain of abdominal pain or nausea but she did vomit a small amount of nonbloody emesis yesterday The daughter just informed me that the patient's oldest sister got sick in Sep 10, 2022 and died in Oct 11, 2022 and that she and the patient were very close.  Those events coincide somewhat with the patient's progressive weakness, failure to thrive. Dr. Havery Moros had ordered cortisol levels last week when she was an outpatient, but these were never collected.    Past Medical History:  Diagnosis Date   Anemia    years ago   Arthritis    "left hand" (02/17/2013)   C. difficile diarrhea    Glaucoma    High cholesterol    "on RX years ago" (02/17/2013)   Hypertension    Meningioma ([address]) 05/01/2015   Osteoporosis 02/2014   T score -2.5  followed by Dr. Lysle Rubens   Palpitations    PACs, PVCs and short runs of atrial tachycardia on Holter monitoring   TIA (transient ischemic attack) 1990's    Past Surgical History:  Procedure Laterality Date   ANAL FISTULECTOMY  1970   COLONOSCOPY     COLONOSCOPY WITH PROPOFOL N/A 12/02/2016   Procedure: COLONOSCOPY WITH PROPOFOL;  Surgeon: Gatha Mayer, MD;  Location: [facility] ENDOSCOPY;  Service: Endoscopy;  Laterality: N/A;   FECAL TRANSPLANT N/A 12/02/2016   Procedure: FECAL TRANSPLANT;  Surgeon: Gatha Mayer, MD;  Location: [facility] ENDOSCOPY;  Service: Endoscopy;  Laterality: N/A;   TONSILLECTOMY      Prior to Admission medications   Medication Sig Start Date End Date Taking? Authorizing Provider  aspirin (ECOTRIN LOW STRENGTH) 81 MG EC tablet Take 81 mg by mouth daily. Swallow whole.   Yes [provider]  cephALEXin (KEFLEX) 500 MG capsule Take 1 capsule (500 mg total) by mouth every 8 (eight) hours for 1 day. 12/18/18 12/20/18 Yes Amin, Jeanella Flattery,  MD  cholecalciferol (VITAMIN D) 1000 units tablet Take 1,000 Units by mouth daily.   Yes [provider]  clopidogrel (PLAVIX) 75 MG tablet Take 75 mg by mouth daily.   Yes [provider]  folic acid (FOLVITE) 1 MG tablet Take 1 mg by mouth daily.   Yes [provider]  latanoprost (XALATAN) 0.005 % ophthalmic solution Place 1 drop into both eyes at bedtime.    Yes [provider]  loratadine (CLARITIN) 10 MG tablet Take 1 tablet (10 mg total) by mouth daily. Patient taking differently: Take 10 mg by mouth daily as needed for allergies or rhinitis.  10/05/17  Yes Thurnell Lose,  MD  pantoprazole (PROTONIX) 20 MG tablet Take 20 mg by mouth daily. 12/09/18  Yes [provider]    Scheduled Meds:  aspirin EC  81 mg Oral Daily   clopidogrel  75 mg Oral Daily   feeding supplement (ENSURE ENLIVE)  237 mL Oral BID BM   feeding supplement (PRO-STAT SUGAR FREE 64)  30 mL Oral BID WC   folic acid  1 mg Oral Daily   Gerhardt's butt cream   Topical TID   multivitamin with minerals  1 tablet Oral Daily   pantoprazole  20 mg Oral Daily   sodium chloride flush  3 mL Intravenous Q12H   sodium chloride flush  3 mL Intravenous Q12H   Infusions:  sodium chloride     PRN Meds: sodium chloride, acetaminophen **OR** acetaminophen, ondansetron **OR** ondansetron (ZOFRAN) IV, sodium chloride flush   Allergies as of 12/19/2018 - Review Complete 12/19/2018  Allergen Reaction Noted   Demerol [meperidine] Other (See Comments) 12/06/2012   Other  10/02/2017   Codeine Rash 12/06/2012   Sulfa antibiotics Itching and Other (See Comments) 12/06/2012   Tomato Itching 04/03/2015    Family History  Problem Relation Age of Onset   Hypertension Father    Heart attack Father    Stroke Brother    Stroke Maternal Grandmother     Social History   Socioeconomic History   Marital status: Widowed    Spouse name: Not on file   Number of  children: 1   Years of education: 75   Highest education level: Not on file  Occupational History   Occupation: retired  Scientist, product/process development strain: Not on file   Food insecurity    Worry: Not on file    Inability: Not on Lexicographer needs    Medical: Not on file    Non-medical: Not on file  Tobacco Use   Smoking status: Never Smoker   Smokeless tobacco: Never Used  Substance and Sexual Activity   Alcohol use: No   Drug use: No   Sexual activity: Never    Comment: 1st intercourse 85 yo-Fewer than 5 partners  Lifestyle   Physical activity    Days per week: Not on file    Minutes per session: Not on file   Stress: Not on file  Relationships   Social connections    Talks on phone: Not on file    Gets together: Not on file    Attends religious service: Not on file    Active member of club or organization: Not on file    Attends meetings of clubs or organizations: Not on file    Relationship status: Not on file   Intimate partner violence    Fear of current or ex partner: Not on file    Emotionally abused: Not on file    Physically abused: Not on file    Forced sexual activity: Not on file  Other Topics Concern   Not on file  Social History Narrative   Patient does not drink caffeine.   Patient is right handed.     REVIEW OF SYSTEMS: Constitutional: See HPI. ENT:  No nose bleeds Pulm: No shortness of breath or cough does sigh when she has to get up or perform minimal activity. CV:  No palpitations, no LE edema.  No chest pain GU:  No hematuria, no frequency GI: See HPI. Heme: Unusual bleeding or bruising tendencies. Transfusions: None. Neuro:  No headaches, no peripheral  tingling or numbness Derm:  No itching, no rash or sores.  Endocrine:  No sweats or chills.  Tends to feel cold and need to stay wrapped up.  No polyuria or dysuria Immunization: Unknown. Travel:  None in the last several months.    PHYSICAL EXAM: Vital  signs in last 24 hours: Vitals:   12/20/18 0400 12/20/18 0516  BP: (!) 112/52 (!) 107/58  Pulse: 72 60  Resp: (!) 26 17  Temp:  98.9 F (37.2 C)  SpO2: 95% 100%   Wt Readings from Last 3 Encounters:  12/20/18 47.9 kg  12/16/18 45.5 kg  12/09/18 45.5 kg    General: Thin, somewhat cachectic looking AAF with flat affect. Head: No facial asymmetry or swelling.  No signs of head trauma. Eyes: Conjunctival not pale.  No scleral icterus. Ears: Not hard of hearing. Nose: No discharge or congestion Mouth: Tongue midline.  Oral mucosa moist, pink, clear. Neck: No JVD, no masses, no thyromegaly. Lungs: Air bilaterally though overall reduced breath sounds with poor inspiratory effort. Heart: RRR.  No MRG.  S1, S2 present. Abdomen: Soft.  Not tender, not distended.  Active bowel sounds.  No HSM, masses, bruits, hernias.   Rectal: Deferred Musc/Skeltl: No joint redness, swelling, gross deformity.  Generally osteoporotic in appearance. Extremities: No CCE. Neurologic: Alert.  Oriented x3.  Follows all commands.  No tremors.  Moves all 4 limbs, strength not tested. Skin: No rash, no suspicious lesions.  Note stage II pressure injuries at the sacrum, coccyx per skin/wound care nurse. Nodes: No cervical adenopathy. Psych: Cooperative, pleasant.  Very flat affect.  Intake/Output from previous day: 08/24 0701 - 08/25 0700 In: 500 [IV Piggyback:500] Out: -  Intake/Output this shift: Total I/O In: 280 [P.O.:280] Out: -   LAB RESULTS: Recent Labs    12/18/18 0524 12/20/18 0012 12/20/18 0554  WBC 2.7* 3.0* 1.9*  HGB 9.7* 11.1* 10.0*  HCT 28.7* 33.7* 29.6*  PLT 120* 113* 114*   BMET Lab Results  Component Value Date   NA 132 (L) 12/20/2018   NA 128 (L) 12/20/2018   NA 132 (L) 12/18/2018   K 3.4 (L) 12/20/2018   K 3.5 12/20/2018   K 3.7 12/18/2018   CL 109 12/20/2018   CL 104 12/20/2018   CL 111 12/18/2018   CO2 17 (L) 12/20/2018   CO2 16 (L) 12/20/2018   CO2 16 (L)  12/18/2018   GLUCOSE 72 12/20/2018   GLUCOSE 89 12/20/2018   GLUCOSE 69 (L) 12/18/2018   BUN 10 12/20/2018   BUN 11 12/20/2018   BUN 9 12/18/2018   CREATININE 0.81 12/20/2018   CREATININE 0.83 12/20/2018   CREATININE 0.86 12/18/2018   CALCIUM 7.8 (L) 12/20/2018   CALCIUM 8.3 (L) 12/20/2018   CALCIUM 8.1 (L) 12/18/2018   LFT Recent Labs    12/20/18 0012 12/20/18 0554  PROT 5.6* 4.7*  ALBUMIN 2.3* 2.1*  AST 268* 234*  ALT 135* 119*  ALKPHOS 81 75  BILITOT 0.7 0.6   PT/INR Lab Results  Component Value Date   INR 1.1 12/16/2018   INR 1.14 04/08/2015   Hepatitis Panel No results for input(s): HEPBSAG, HCVAB, HEPAIGM, HEPBIGM in the last 72 hours. C-Diff No components found for: CDIFF Lipase     Component Value Date/Time   LIPASE 123 (H) 12/20/2018 0012    Drugs of Abuse  No results found for: LABOPIA, COCAINSCRNUR, LABBENZ, AMPHETMU, THCU, LABBARB   RADIOLOGY STUDIES: Ct Abdomen Pelvis W Contrast  Result Date:  12/20/2018 CLINICAL DATA:  Abnormal LFTs, generalized weakness EXAM: CT ABDOMEN AND PELVIS WITH CONTRAST TECHNIQUE: Multidetector CT imaging of the abdomen and pelvis was performed using the standard protocol following bolus administration of intravenous contrast. CONTRAST:  159m OMNIPAQUE IOHEXOL 300 MG/ML  SOLN COMPARISON:  CT 10/17/2016 FINDINGS: Lower chest: Lung bases demonstrate small bilateral pleural effusions and passive atelectasis within the posterior lower lobes. The heart appears slightly enlarged. Hepatobiliary: Central hepatic cyst. No calcified gallstone or biliary dilatation Pancreas: Unremarkable. No pancreatic ductal dilatation or surrounding inflammatory changes. Spleen: Normal in size without focal abnormality. Adrenals/Urinary Tract: Adrenal glands are unremarkable. Kidneys are normal, without renal calculi, focal lesion, or hydronephrosis. Bladder is unremarkable. Stomach/Bowel: Stomach is nonenlarged. No dilated small bowel. Fluid-filled colon  without bowel wall thickening. Appendix not clearly identified but no right lower quadrant inflammatory process. Vascular/Lymphatic: Nonaneurysmal aorta. Scattered aortic atherosclerosis. No significantly enlarged lymph nodes Reproductive: Calcified uterine fibroids.  No adnexal mass Other: No free air. Probable small amount of free fluid in the pelvis Musculoskeletal: Trace anterolisthesis L3 on L4. Degenerative changes at L3-L4 and L4-L5 IMPRESSION: 1. No CT evident for acute intra-abdominal or pelvic abnormality. Diffuse fluid within the colon suggesting possible enteritis or diarrheal illness 2. Small bilateral pleural effusions with passive atelectasis at the bases 3. Uterine fibroids Electronically Signed   By: KDonavan FoilM.D.   On: 12/20/2018 02:28   Dg Abdomen Acute W/chest  Result Date: 12/20/2018 CLINICAL DATA:  85year old female with generalized weakness. EXAM: DG ABDOMEN ACUTE W/ 1V CHEST COMPARISON:  CT of the abdomen pelvis dated 10/18/2016 FINDINGS: Probable background of emphysema. Trace right pleural effusion with associated atelectasis. No focal consolidation or pneumothorax. Mild cardiomegaly. Atherosclerotic calcification of the aorta. There is no bowel dilatation or evidence of obstruction. No free air. Probable calcified fibroid in the pelvis. Degenerative changes of the spine and hips. The soft tissues are unremarkable. IMPRESSION: 1. Trace right pleural effusion with associated atelectasis. 2. No bowel obstruction. Electronically Signed   By: AAnner CreteM.D.   On: 12/20/2018 00:05     IMPRESSION:   *     Fluid in the colon, R/O diarrhea.  Refractory C. difficile ultimately undergoing colonoscopy/FMT 11/2016.   Dr. CMegan Salonfrom infectious disease was asked to look at patient for consideration of recurrent C. difficile.  He notes, "I do not think that infection is the cause of her weakness ". CK level normal 4 days ago ruling out myositis.  *   Weakness, anorexia,  protein calorie malnutrition, failure to thrive.   CK level normal 4 days ago ruling out myositis. Wonder if the profound malnutrition is causing some colonic malabsorption and appearance of fluid in the bowel. After speaking with the daughter today, the new history of the recent death of her oldest sister and a family who is extremely tight knit is concerning for depression.  This would fit is fit with weakness, loss of appetite and resulting malnutrition.  However it does not explain the elevated LFTs.  *    Elevated LFTs.  Liver, biliary tract, gallbladder unremarkable other than a cyst in the liver.  ? Could this be due to dehydration, hypotension?  *   Pancytopenia.  Significantly low WBC count at 1.9, platelets 114, Hgb 10.  *   Chronic Plavix  *   Recent Klebsiella uti rx Rocephin >> Keflex.  Current U/A not s/o UTI.     PLAN:     *    Cortisol level?     *   ?  Psych eval and or initiation of antidepressant with appetite enhancing s/e??  *   Add Megace??   Azucena Freed  12/20/2018, 1:07 PM Phone [phone]    Attending Physician Note   I have taken a history, examined the patient and reviewed the chart. I agree with the Advanced Practitioner's note, impression and recommendations.  Fluid in colon is likely an insignificant finding related to recent antibiotics or diet change as no bowel wall thickening or other bowel lesion noted  Elevated LFTs etiology unclear, R/O secondary to medication, recent antibiotic, mild shock liver  Pancytopenia Internal hemorrhoids with recent minor rectal bleeding Hypotension, hyponatremia, weakness dehydration  Agree with ID consult to observe off antibiotics IV hydration Correct Na  GI pathogen panel ordered Await hepatitis panel  No plans for endoscopic evaluation Trend LFTs  Lucio Edward, MD [facility]

## 2018-12-20 NOTE — Telephone Encounter (Signed)
Will see

## 2018-12-20 NOTE — Telephone Encounter (Signed)
-----   Message from [place] sent at 12/20/2018  9:28 AM EDT ----- Regarding: Update Contact: Ndidi Ramaswamy (daugter) [phone] Daughter called wanted to provide [name] with an update of patient being admitted at the hospital [facility]). Daughter would like [name] to visit patient at hospital and review that the medication that is being given to her mother are the proper medication for her.

## 2018-12-20 NOTE — Progress Notes (Signed)
Initial Nutrition Assessment  DOCUMENTATION CODES:   Non-severe (moderate) malnutrition in context of chronic illness  INTERVENTION:   -Downgrade diet to dysphagia 3 (advanced mechanical soft) for ease of intake -Ensure Enlive po BID, each supplement provides 350 kcal and 20 grams of protein -30 ml Prostat BID, each supplement provides 100 kcals and 15 grams protein -Magic cup TID with meals, each supplement provides 290 kcal and 9 grams of protein -MVI with minerals daily  NUTRITION DIAGNOSIS:   Moderate Malnutrition related to chronic illness(TIA) as evidenced by mild fat depletion, moderate fat depletion, mild muscle depletion, moderate muscle depletion.  GOAL:   Patient will meet greater than or equal to 90% of their needs  MONITOR:   PO intake, Supplement acceptance, Labs, Weight trends, Skin, I & O's  REASON FOR ASSESSMENT:   Consult Assessment of nutrition requirement/status  ASSESSMENT:   Sydney [name] is a 84 y.o. 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  Patient is accompanied by her daughter who assists with the history.  Patient has history of recurrent C. difficile colitis, has continued to have loose stools, though this has improved to 4 or 5/day, has had progressively worsening appetite, eating only 3 or 4 bites and unable to even drink half of a nutritional supplement drink.  Patient denies any abdominal pain since the recent hospitalization, no fevers or chills have been noted, and she denies any dysuria though she may have complained of this a few days ago.  She was requiring assistance with ambulation and had home health PT set up at recent discharge, but has become even more weak in general  since leaving the hospital and had a near syncopal episode today when she tried to get up from a bedside commode.  Pt admitted with generalized weakness and ? UTI.   Reviewed I/O's: +500 ml x 24 hours  Pt sitting up in bed at time of visit, however, not responsive to questions. No family at bedside to provide additional history.   Pt reports she consumed some breakfast this morning, however, noted meal completion 10%. Per H&P, pt has been consuming only bites and sips over the past 4-5 days.   Reviewed wt hx; noted wt has been stable over the past year.   Labs reviewed: Na: 132, K: 3.4.   NUTRITION - FOCUSED PHYSICAL EXAM:    Most Recent Value  Orbital Region  Moderate depletion  Upper Arm Region  Mild depletion  Thoracic and Lumbar Region  Mild depletion  Buccal Region  Mild depletion  Temple Region  Mild depletion  Clavicle Bone Region  Moderate depletion  Clavicle and Acromion Bone Region  Moderate depletion  Scapular Bone Region  Moderate depletion  Dorsal Hand  Mild depletion  Patellar Region  Moderate depletion  Anterior Thigh Region  Moderate depletion  Posterior Calf Region  Moderate depletion  Edema (RD Assessment)  None  Hair  Reviewed  Eyes  Reviewed  Mouth  Reviewed  Skin  Reviewed  Nails  Reviewed       Diet Order:   Diet Order            Diet Heart Room service appropriate? Yes; Fluid consistency: Thin  Diet effective now  EDUCATION NEEDS:   Not appropriate for education at this time  Skin:  Skin Assessment: Skin Integrity Issues: Skin Integrity Issues:: Stage II Stage II: sacrum, coccyx  Last BM:  12/20/18  Height:   Ht Readings from Last 1 Encounters:  12/20/18 5\' 3"  (1.6 m)    Weight:   Wt Readings from Last 1 Encounters:  12/20/18 47.9 kg    Ideal Body Weight:  52.3 kg  BMI:  Body mass index is 18.71 kg/m.  Estimated Nutritional Needs:   Kcal:  1250-1450  Protein:  60-75 grams  Fluid:  > 1.2  L    Roberto Romanoski A. Jimmye Norman, RD, LDN, [place] Registered Dietitian II Certified Diabetes Care and Education Specialist Pager: [phone] After hours Pager: [phone]

## 2018-12-20 NOTE — Telephone Encounter (Signed)
RN spoke with patient's daughter, let her know that Dr Megan Salon is on call at [facility].    Patient has history of C diff with stool transplant 11/2016 and is on antibiotics at [facility] again - the 4th time in 1 week. Patient's daughter has already texted Dr [name] this morning. She acknowledged that she is a nervous daughter, but would like ID to know what's going on.  Patient is in [place] bed 21  [place], [place], [place]

## 2018-12-20 NOTE — H&P (Signed)
History and Physical    Sydney [name] [id number] DOB: 06-08-1930 DOA: 12/19/2018  PCP: [name], MD   Patient coming from: Home   Chief Complaint: Gen weakness, near-syncope   HPI: Sydney [name] is a 85 y.o. 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  Patient is accompanied by her daughter who assists with the history.  Patient has history of recurrent C. difficile colitis, has continued to have loose stools, though this has improved to 4 or 5/day, has had progressively worsening appetite, eating only 3 or 4 bites and unable to even drink half of a nutritional supplement drink.  Patient denies any abdominal pain since the recent hospitalization, no fevers or chills have been noted, and she denies any dysuria though she may have complained of this a few days ago.  She was requiring assistance with ambulation and had home health PT set up at recent discharge, but has become even more weak in general since leaving the hospital and had a near syncopal episode today when she tried to get up from a bedside commode.  Patient was admitted to the hospital on 12/16/2018 with generalized weakness, dehydration, and acute UTI.  She was treated with Rocephin and IV fluids, and was discharged home on 12/18/2018 with home health, Keflex, and GI follow-up.  ED Course: Upon arrival to the ED, patient is found to be afebrile, saturating well on room air, blood pressure 107/73, and heart rate 72.  EKG features a sinus rhythm and chest x-ray is notable for trace right pleural effusion.  CT of the abdomen and pelvis is negative for acute intra-abdominal or pelvic abnormality.  Chemistry panel is concerning for sodium of 128, bicarbonate 16,  glucose 69, AST 268, and ALT 135.  CBC features a pancytopenia with WBC 3000, hemoglobin of 11.1, and platelet count 113,000.  Lactic acid is reassuringly normal.  Patient was given 500 cc normal saline, blood cultures were collected, UA, urine culture, and COVID-19 screening test were all ordered but not yet collected, and hospitalists are consulted for admission.  Review of Systems:  All other systems reviewed and apart from HPI, are negative.  Past Medical History:  Diagnosis Date   Anemia    years ago   Arthritis    "left hand" (02/17/2013)   C. difficile diarrhea    Glaucoma    High cholesterol    "on RX years ago" (02/17/2013)   Hypertension    Meningioma ([place]) 05/01/2015   Osteoporosis 02/2014   T score -2.5  followed by Dr. Lysle Rubens   Palpitations    PACs, PVCs and short runs of atrial tachycardia on Holter monitoring   TIA (transient ischemic attack) 1990's    Past Surgical History:  Procedure Laterality Date   ANAL FISTULECTOMY  1970   COLONOSCOPY     COLONOSCOPY WITH PROPOFOL N/A 12/02/2016   Procedure: COLONOSCOPY WITH PROPOFOL;  Surgeon: Gatha Mayer, MD;  Location: [facility] ENDOSCOPY;  Service: Endoscopy;  Laterality: N/A;   FECAL TRANSPLANT N/A 12/02/2016   Procedure: FECAL TRANSPLANT;  Surgeon: Gatha Mayer, MD;  Location: [facility] ENDOSCOPY;  Service: Endoscopy;  Laterality: N/A;   TONSILLECTOMY       reports that she has never smoked. She has never used smokeless tobacco. She reports that she does not  drink alcohol or use drugs.  Allergies  Allergen Reactions   Demerol [Meperidine] Other (See Comments)    Excessive sweating, cramping   Other     No Antibiotic without consultation with her primary physician.   Codeine Rash   Sulfa Antibiotics Itching and Other (See Comments)    Reaction unknown   Tomato Itching    Family History  Problem Relation Age of Onset   Hypertension Father    Heart attack Father    Stroke Brother    Stroke  Maternal Grandmother      Prior to Admission medications   Medication Sig Start Date End Date Taking? Authorizing Provider  aspirin (ECOTRIN LOW STRENGTH) 81 MG EC tablet Take 81 mg by mouth daily. Swallow whole.   Yes [provider]  cephALEXin (KEFLEX) 500 MG capsule Take 1 capsule (500 mg total) by mouth every 8 (eight) hours for 1 day. 12/18/18 12/20/18 Yes Amin, Jeanella Flattery, MD  cholecalciferol (VITAMIN D) 1000 units tablet Take 1,000 Units by mouth daily.   Yes [provider]  clopidogrel (PLAVIX) 75 MG tablet Take 75 mg by mouth daily.   Yes [provider]  folic acid (FOLVITE) 1 MG tablet Take 1 mg by mouth daily.   Yes [provider]  latanoprost (XALATAN) 0.005 % ophthalmic solution Place 1 drop into both eyes at bedtime.    Yes [provider]  loratadine (CLARITIN) 10 MG tablet Take 1 tablet (10 mg total) by mouth daily. Patient taking differently: Take 10 mg by mouth daily as needed for allergies or rhinitis.  10/05/17  Yes Thurnell Lose, MD  pantoprazole (PROTONIX) 20 MG tablet Take 20 mg by mouth daily. 12/09/18  Yes [provider]    Physical Exam: Vitals:   12/20/18 0130 12/20/18 0217 12/20/18 0230 12/20/18 0245  BP: 112/62 107/73 (!) 112/57 (!) 106/56  Pulse:      Resp: (!) 24 (!) 21 19 15   Temp:      TempSrc:      SpO2:        Constitutional: NAD, calm, frail  Eyes: PERTLA, lids and conjunctivae normal ENMT: Mucous membranes are moist. Posterior pharynx clear of any exudate or lesions.   Neck: normal, supple, no masses, no thyromegaly Respiratory:  no wheezing, no crackles. Normal respiratory effort. No accessory muscle use.  Cardiovascular: S1 & S2 heard, regular rate and rhythm. No extremity edema.  Abdomen: No distension, no tenderness, soft. Bowel sounds active.  Musculoskeletal: no clubbing / cyanosis. No joint deformity upper and lower extremities.    Skin: no significant rashes, lesions, ulcers.  Warm, dry, well-perfused. Neurologic: No facial asymmetry. Sensation intact. Moving all extremities.  Psychiatric: Alert and oriented to person, place, and situation. Calm, cooperative.    Labs on Admission: I have personally reviewed following labs and imaging studies  CBC: Recent Labs  Lab 12/13/18 1114 12/15/18 0811 12/16/18 1115 12/17/18 0340 12/18/18 0524 12/20/18 0012  WBC 3.2* 3.3* 3.4* 2.6* 2.7* 3.0*  NEUTROABS 2.1 2.2 2.3  --   --  2.1  HGB 10.8* 10.9* 10.7* 10.2* 9.7* 11.1*  HCT 33.1* 32.8* 32.2* 29.7* 28.7* 33.7*  MCV 96.2 94.5 95.8 91.1 92.6 94.7  PLT 159 148* 127* 129* 120* 123456*   Basic Metabolic Panel: Recent Labs  Lab 12/15/18 0811 12/16/18 1115 12/17/18 0241 12/18/18 0524 12/20/18 0012  NA 133* 131* 132* 132* 128*  K 3.8 4.3 3.3* 3.7 3.5  CL 109 110 111 111 104  CO2  17* 16* 13* 16* 16*  GLUCOSE 86 88 76 69* 89  BUN 15 13 9 9 11   CREATININE 0.85 0.86 0.74 0.86 0.83  CALCIUM 8.4* 8.2* 7.7* 8.1* 8.3*  MG  --   --   --  1.7  --    GFR: Estimated Creatinine Clearance: 34.9 mL/min (by C-G formula based on SCr of 0.83 mg/dL). Liver Function Tests: Recent Labs  Lab 12/13/18 1114 12/15/18 0811 12/16/18 1115 12/17/18 0241 12/20/18 0012  AST 127* 137* 146* 146* 268*  ALT 72* 77* 74* 75* 135*  ALKPHOS 79 82 78 69 81  BILITOT 0.8 0.8 1.1 0.6 0.7  PROT 6.2* 5.9* 5.5* 4.6* 5.6*  ALBUMIN 2.6* 2.5* 2.3* 2.0* 2.3*   Recent Labs  Lab 12/20/18 0012  LIPASE 123*   No results for input(s): AMMONIA in the last 168 hours. Coagulation Profile: Recent Labs  Lab 12/16/18 1316  INR 1.1   Cardiac Enzymes: Recent Labs  Lab 12/16/18 1115  CKTOTAL 140   BNP (last 3 results) No results for input(s): PROBNP in the last 8760 hours. HbA1C: No results for input(s): HGBA1C in the last 72 hours. CBG: Recent Labs  Lab 12/15/18 0718  GLUCAP 72   Lipid Profile: No results for input(s): CHOL, HDL, LDLCALC, TRIG, CHOLHDL, LDLDIRECT in the last 72  hours. Thyroid Function Tests: No results for input(s): TSH, T4TOTAL, FREET4, T3FREE, THYROIDAB in the last 72 hours. Anemia Panel: No results for input(s): VITAMINB12, FOLATE, FERRITIN, TIBC, IRON, RETICCTPCT in the last 72 hours. Urine analysis:    Component Value Date/Time   COLORURINE AMBER (A) 12/16/2018 1116   APPEARANCEUR CLOUDY (A) 12/16/2018 1116   LABSPEC 1.003 (L) 12/16/2018 1116   PHURINE 8.0 12/16/2018 1116   GLUCOSEU NEGATIVE 12/16/2018 1116   HGBUR MODERATE (A) 12/16/2018 1116   [place] 12/16/2018 1116   [place] 12/16/2018 1116   PROTEINUR 30 (A) 12/16/2018 1116   UROBILINOGEN 0.2 04-14-2013 1048   NITRITE NEGATIVE 12/16/2018 1116   LEUKOCYTESUR LARGE (A) 12/16/2018 1116   Sepsis Labs: @LABRCNTIP (procalcitonin:4,lacticidven:4) ) Recent Results (from the past 240 hour(s))  Urine culture     Status: Abnormal   Collection Time: 12/15/18  8:11 AM   Specimen: Urine, Random  Result Value Ref Range Status   Specimen Description URINE, RANDOM  Final   Special Requests NONE  Final   Culture (A)  Final    <10,000 COLONIES/mL INSIGNIFICANT GROWTH Performed at [facility], [place] [address], [place], [place] [address]    Report Status 12/16/2018 FINAL  Final  SARS Coronavirus 2 [facility] order, Performed in [facility] [place] hospital lab) Nasopharyngeal Nasopharyngeal Swab     Status: None   Collection Time: 12/15/18  8:11 AM   Specimen: Nasopharyngeal Swab  Result Value Ref Range Status   SARS Coronavirus 2 NEGATIVE NEGATIVE Final    Comment: (NOTE) If result is NEGATIVE SARS-CoV-2 target nucleic acids are NOT DETECTED. The SARS-CoV-2 RNA is generally detectable in upper and lower  respiratory specimens during the acute phase of infection. The lowest  concentration of SARS-CoV-2 viral copies this assay can detect is 250  copies / mL. A negative result does not preclude SARS-CoV-2 infection  and should not be used as the sole basis for  treatment or other  patient management decisions.  A negative result may occur with  improper specimen collection / handling, submission of specimen other  than nasopharyngeal swab, presence of viral mutation(s) within the  areas targeted by this assay,  and inadequate number of viral copies  (<250 copies / mL). A negative result must be combined with clinical  observations, patient history, and epidemiological information. If result is POSITIVE SARS-CoV-2 target nucleic acids are DETECTED. The SARS-CoV-2 RNA is generally detectable in upper and lower  respiratory specimens dur ing the acute phase of infection.  Positive  results are indicative of active infection with SARS-CoV-2.  Clinical  correlation with patient history and other diagnostic information is  necessary to determine patient infection status.  Positive results do  not rule out bacterial infection or co-infection with other viruses. If result is PRESUMPTIVE POSTIVE SARS-CoV-2 nucleic acids MAY BE PRESENT.   A presumptive positive result was obtained on the submitted specimen  and confirmed on repeat testing.  While 2019 novel coronavirus  (SARS-CoV-2) nucleic acids may be present in the submitted sample  additional confirmatory testing may be necessary for epidemiological  and / or clinical management purposes  to differentiate between  SARS-CoV-2 and other Sarbecovirus currently known to infect humans.  If clinically indicated additional testing with an alternate test  methodology [phone]) is advised. The SARS-CoV-2 RNA is generally  detectable in upper and lower respiratory sp ecimens during the acute  phase of infection. The expected result is Negative. Fact Sheet for Patients:  StrictlyIdeas.no Fact Sheet for Healthcare Providers: BankingDealers.co.za This test is not yet approved or cleared by the [place] FDA and has been authorized for detection and/or  diagnosis of SARS-CoV-2 by FDA under an Emergency Use Authorization (EUA).  This EUA will remain in effect (meaning this test can be used) for the duration of the COVID-19 declaration under Section 564(b)(1) of the Act, 21 U.S.C. section 360bbb-3(b)(1), unless the authorization is terminated or revoked sooner. Performed at [facility], [place] [address] [place], [place] [address]   Urine Culture     Status: Abnormal (Preliminary result)   Collection Time: 12/16/18  1:32 PM   Specimen: Urine, Random  Result Value Ref Range Status   Specimen Description URINE, RANDOM  Final   Special Requests NONE  Final   Culture (A)  Final    >=100,000 COLONIES/mL GRAM NEGATIVE RODS SUSCEPTIBILITIES TO FOLLOW Performed at [facility], [place] [address], [place], [place] [address]    Report Status PENDING  Incomplete     Radiological Exams on Admission: Ct Abdomen Pelvis W Contrast  Result Date: 12/20/2018 CLINICAL DATA:  Abnormal LFTs, generalized weakness EXAM: CT ABDOMEN AND PELVIS WITH CONTRAST TECHNIQUE: Multidetector CT imaging of the abdomen and pelvis was performed using the standard protocol following bolus administration of intravenous contrast. CONTRAST:  180mL OMNIPAQUE IOHEXOL 300 MG/ML  SOLN COMPARISON:  CT 10/17/2016 FINDINGS: Lower chest: Lung bases demonstrate small bilateral pleural effusions and passive atelectasis within the posterior lower lobes. The heart appears slightly enlarged. Hepatobiliary: Central hepatic cyst. No calcified gallstone or biliary dilatation Pancreas: Unremarkable. No pancreatic ductal dilatation or surrounding inflammatory changes. Spleen: Normal in size without focal abnormality. Adrenals/Urinary Tract: Adrenal glands are unremarkable. Kidneys are normal, without renal calculi, focal lesion, or hydronephrosis. Bladder is unremarkable. Stomach/Bowel: Stomach is nonenlarged. No dilated small bowel. Fluid-filled colon without bowel wall thickening. Appendix  not clearly identified but no right lower quadrant inflammatory process. Vascular/Lymphatic: Nonaneurysmal aorta. Scattered aortic atherosclerosis. No significantly enlarged lymph nodes Reproductive: Calcified uterine fibroids.  No adnexal mass Other: No free air. Probable small amount of free fluid in the pelvis Musculoskeletal: Trace anterolisthesis L3 on L4. Degenerative changes at L3-L4 and L4-L5  IMPRESSION: 1. No CT evident for acute intra-abdominal or pelvic abnormality. Diffuse fluid within the colon suggesting possible enteritis or diarrheal illness 2. Small bilateral pleural effusions with passive atelectasis at the bases 3. Uterine fibroids Electronically Signed   By: Donavan Foil M.D.   On: 12/20/2018 02:28   Dg Abdomen Acute W/chest  Result Date: 12/20/2018 CLINICAL DATA:  85 year old female with generalized weakness. EXAM: DG ABDOMEN ACUTE W/ 1V CHEST COMPARISON:  CT of the abdomen pelvis dated 10/18/2016 FINDINGS: Probable background of emphysema. Trace right pleural effusion with associated atelectasis. No focal consolidation or pneumothorax. Mild cardiomegaly. Atherosclerotic calcification of the aorta. There is no bowel dilatation or evidence of obstruction. No free air. Probable calcified fibroid in the pelvis. Degenerative changes of the spine and hips. The soft tissues are unremarkable. IMPRESSION: 1. Trace right pleural effusion with associated atelectasis. 2. No bowel obstruction. Electronically Signed   By: Anner Crete M.D.   On: 12/20/2018 00:05    EKG: Independently reviewed. Sinus rhythm, rate 76, QTc 398 ms.   Assessment/Plan   1. Generalized weakness  - Presents with generalized weakness and pre-syncope, found to be hypotensive with EMS and received 500 cc NS prior to arrival in ED  - No focal weakness  - She has been taking abx for UTI, but there is no fever or tachycardia, lactate is normal, and the presentation is likely related more to dehydration/hypovolemia  rather than sepsis  - CK and TSH were normal a few days ago  - Likely secondary to dehydration and malnutrition  - Continue IVF hydration, PT and dietary consultations     2. ?UTI  - Culture from 12/16/18 growing >100k cfu GNR's  - She was treated with Rocephin for two days in hospital and then took Keflex at home on 8/24  - There is no fever, lactic acid is normal, and she denies any urinary complaints or flank pain  - She denies urinary sxs and with C difficile history, will watch off of abx for now     3. Pancytopenia  - WBC and Hgb appear stable but platelet count has decreased some to 113k from 120k at time of recent discharge  - No active bleeding, she is at high-risk for nutrient deficiency, meyelodysplasia a concern, possibly related to infection, will check anemia panel    4. Hyponatremia   - Serum sodium is 128 in ED in setting of hypovolemia  - She was given 500 cc NS with EMS and another 500 cc NS in ED  - Continue IVF hydration, repeat chem panel in am   5. Protein-calorie malnutrition  - Prealbumin <5 earlier this month  - She has not been eating much at all despite encouragement from daughter but denies abdominal pain or nausea  - Encourage to eat, offer supplements, continue GI follow-up on discharge    6. Elevated transaminases - AST is 268 and ALT 135 in ED, increased from recent priors  - Abd exam is benign  - She had been seen by GI in the hospital a few days ago and viral hepatitis panel was sent but not yet resulted  - Continue supportive care, trend LFT's, follow-up viral panel   7. History of TIA  - Continue ASA, Plavix      PPE: Mask, face shield  DVT prophylaxis: SCD's  Code Status: Full  Family Communication: Daughter updated at bedside  Consults called: None  Admission status: Observation     Vianne Bulls, MD [facility]  Pager [phone]  If 7PM-7AM, please contact night-coverage [email or web address] Password [facility]  12/20/2018, 2:55 AM

## 2018-12-20 NOTE — ED Notes (Signed)
Pt family member requesting that I put pt on bedside commode; due to pt weakness and EMS being called for pt having a near syncope episode on the bedside commode at home this nurse advised family that this wasn't safe and placed pt on bedpan. Pt family continued to ask for bedside commode. Spoke with Opyd Md whom states that he agreed with decision to use bedpan at this time.

## 2018-12-21 ENCOUNTER — Inpatient Hospital Stay (HOSPITAL_COMMUNITY): Payer: Medicare Other

## 2018-12-21 DIAGNOSIS — D649 Anemia, unspecified: Secondary | ICD-10-CM

## 2018-12-21 DIAGNOSIS — R740 Nonspecific elevation of levels of transaminase and lactic acid dehydrogenase [LDH]: Secondary | ICD-10-CM

## 2018-12-21 DIAGNOSIS — D72819 Decreased white blood cell count, unspecified: Secondary | ICD-10-CM | POA: Diagnosis not present

## 2018-12-21 DIAGNOSIS — D61818 Other pancytopenia: Secondary | ICD-10-CM | POA: Diagnosis not present

## 2018-12-21 DIAGNOSIS — E440 Moderate protein-calorie malnutrition: Secondary | ICD-10-CM | POA: Diagnosis not present

## 2018-12-21 DIAGNOSIS — H9190 Unspecified hearing loss, unspecified ear: Secondary | ICD-10-CM

## 2018-12-21 DIAGNOSIS — R945 Abnormal results of liver function studies: Secondary | ICD-10-CM | POA: Diagnosis not present

## 2018-12-21 DIAGNOSIS — E871 Hypo-osmolality and hyponatremia: Secondary | ICD-10-CM | POA: Diagnosis not present

## 2018-12-21 DIAGNOSIS — R531 Weakness: Secondary | ICD-10-CM | POA: Diagnosis not present

## 2018-12-21 LAB — COMPREHENSIVE METABOLIC PANEL
ALT: 114 U/L — ABNORMAL HIGH (ref 0–44)
AST: 229 U/L — ABNORMAL HIGH (ref 15–41)
Albumin: 1.9 g/dL — ABNORMAL LOW (ref 3.5–5.0)
Alkaline Phosphatase: 71 U/L (ref 38–126)
Anion gap: 6 (ref 5–15)
BUN: 11 mg/dL (ref 8–23)
CO2: 14 mmol/L — ABNORMAL LOW (ref 22–32)
Calcium: 7.8 mg/dL — ABNORMAL LOW (ref 8.9–10.3)
Chloride: 112 mmol/L — ABNORMAL HIGH (ref 98–111)
Creatinine, Ser: 0.7 mg/dL (ref 0.44–1.00)
GFR calc Af Amer: >60 mL/min (ref >60)
GFR calc non Af Amer: >60 mL/min (ref >60)
Glucose, Bld: 77 mg/dL (ref 70–99)
Potassium: 3.2 mmol/L — ABNORMAL LOW (ref 3.5–5.1)
Sodium: 132 mmol/L — ABNORMAL LOW (ref 135–145)
Total Bilirubin: 0.7 mg/dL (ref 0.3–1.2)
Total Protein: 4.8 g/dL — ABNORMAL LOW (ref 6.5–8.1)

## 2018-12-21 LAB — CBC
HCT: 29.5 % — ABNORMAL LOW (ref 36.0–46.0)
Hemoglobin: 10 g/dL — ABNORMAL LOW (ref 12.0–15.0)
MCH: 31.3 pg (ref 26.0–34.0)
MCHC: 33.9 g/dL (ref 30.0–36.0)
MCV: 92.2 fL (ref 80.0–100.0)
Platelets: 111 10*3/uL — ABNORMAL LOW (ref 150–400)
RBC: 3.2 MIL/uL — ABNORMAL LOW (ref 3.87–5.11)
RDW: 14.6 % (ref 11.5–15.5)
WBC: 1.6 10*3/uL — ABNORMAL LOW (ref 4.0–10.5)
nRBC: 0 % (ref 0.0–0.2)

## 2018-12-21 LAB — URINE CULTURE: Culture: <10000 — AB

## 2018-12-21 LAB — CORTISOL-AM, BLOOD: Cortisol - AM: 8.3 ug/dL (ref 6.7–22.6)

## 2018-12-21 LAB — GLUCOSE, CAPILLARY
Glucose-Capillary: 76 mg/dL (ref 70–99)
Glucose-Capillary: 82 mg/dL (ref 70–99)

## 2018-12-21 LAB — MAGNESIUM: Magnesium: 1.7 mg/dL (ref 1.7–2.4)

## 2018-12-21 MED ORDER — POTASSIUM CHLORIDE CRYS ER 20 MEQ PO TBCR
40.0000 meq | EXTENDED_RELEASE_TABLET | ORAL | Status: AC
  Administered 2018-12-21 (×2): 40 meq via ORAL
  Filled 2018-12-21 (×2): qty 2

## 2018-12-21 MED ORDER — BOOST / RESOURCE BREEZE PO LIQD CUSTOM
1.0000 | Freq: Three times a day (TID) | ORAL | Status: DC
  Administered 2018-12-21 – 2018-12-26 (×7): 1 via ORAL

## 2018-12-21 MED ORDER — SODIUM CHLORIDE 0.9 % IV SOLN
INTRAVENOUS | Status: DC
  Administered 2018-12-21: 16:00:00 via INTRAVENOUS

## 2018-12-21 NOTE — Consult Note (Signed)
   [facility] Inpatient Consult   12/21/2018  Sydney [name] 1931/02/18 [id number]  Patient screened for unplanned readmission   Hospitalizations with less than 7 days and  to check if potential [place] Management services are needed.  Review of patient's medical record reveals patient is per brief per progress notes HPI:  Sydney [name] is a 85 y.o. female history of severe, recurrent C. difficile colitis.  She had a fecal transplant in 2018.  She has not had any further episodes of diarrhea since that time.  Her daughter says that she has been eating less and less since June.  Mother say that she is full after only a few bites of food.  Normally she is very active in independent.  She believes that her mother has been losing some weight.  Her sister died in 2022/10/17 and she has wondered if her mother is suffering from depression. [per Dr. Megan Salon  Insurance Plan:  Medicare ACO  Primary Care Provider is [name] MD of [facility], this provider is listed to provide the transition of care follow up.  Plan:  Will follow up with PT/OT recommendations and follow up with inpatient [facility] team for disposition and needs.    Please place a [facility] Care Management consult as appropriate and for questions contact:   Natividad Brood, RN BSN [facility] Liaison  [phone] business mobile phone Toll free office [phone]  Fax number: [phone] [email or web address]

## 2018-12-21 NOTE — Progress Notes (Signed)
Patient ID: Sydney [name], female   DOB: [birth year], 85 y.o.   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  CT of the abdomen and pelvis was negative for acute intra-abdominal or pelvic abnormality.  She was found to have mildly elevated LFTs along with hyponatremia and pancytopenia.  She was given IV fluids.  ID and GI were consulted.  Assessment & Plan:   Generalized weakness Protein calorie malnutrition/failure to thrive -Questionable cause. -There might be a component of depression as well leading to very poor oral intake.  Discussed the same with [name] on phone today: She wants to discuss with PCP before starting any kind of antidepressant or appetite stimulant. -CT of the head was negative for acute intracranial abnormality. -Daughter is concerned about her acute change in ability to walk.  Will get MRI of the brain. -No evidence of UTI.  Monitor off antibiotics. -PT/OT evaluation -Oral intake is still poor.  Will put her on normal saline at 75 cc an hour -Nutrition consult  Elevated LFTs -LFTs still elevated.  Abdominal exam is benign.  GI following.  Hepatitis panel negative.  ID is ordering EBV and CMV serologies. -Monitor LFTs. -CT of the abdomen with no evidence of acute intra-abdominal or pelvic abnormality but with diffuse fluid within the colon suggestive of possible enteritis or diarrheal illness  Pancytopenia -Questionable cause.  Monitor.  Might need outpatient hematology evaluation.  Hyponatremia -Sodium 128 in the ED.  132 today.  IV fluids as above  History of TIA -continue aspirin and Plavix   Stage II sacral pressure injury: Present on admission -Wound care as per wound care consult recommendations.    DVT prophylaxis: SCDs Code Status: Full Family Communication: Spoke to daughter/Angela on phone on 12/21/2018 Disposition Plan: Home in 1 to 2 days if clinically improves  Consultants: ID/GI  Procedures: None  Antimicrobials: None   Subjective: Patient seen and examined at bedside.  She is awake, poor historian.  Feels slightly better.  Nursing staff reports very poor oral intake.  Objective: Vitals:   12/20/18 1418 12/20/18 2036 12/21/18 0500 12/21/18 0500  BP: (!) 100/59 107/60  (!) 103/54  Pulse: (!) 55 72  60  Resp: 20 15  15   Temp: 97.9 F (36.6 C) (!) 97.5 F (36.4 C)  98.2 F (36.8 C)  TempSrc: Oral Oral  Oral  SpO2: 99% 100%  100%  Weight:   47.7 kg   Height:        Intake/Output Summary (Last 24 hours) at 12/21/2018 1454 Last data filed at 12/21/2018 0725 Gross per 24 hour  Intake 406 ml  Output 1000 ml  Net -594 ml   Filed Weights   12/20/18 0956 12/21/18 0500  Weight: 47.9 kg 47.7 kg    Examination:  General exam: Appears calm and comfortable.  Looks older than stated age.  Very poor historian. Respiratory system: Bilateral decreased breath sounds at bases Cardiovascular system: S1 & S2 heard, Rate controlled Gastrointestinal system: Abdomen is nondistended, soft and nontender. Normal bowel sounds heard. Extremities: No cyanosis, clubbing, edema   Data Reviewed: I have personally reviewed following labs and imaging studies  CBC: Recent Labs  Lab 12/15/18 [phone]  12/16/18 1115 12/17/18 0340 12/18/18 0524 12/20/18 0012 12/20/18 0554 12/21/18 0228  WBC 3.3* 3.4* 2.6* 2.7* 3.0* 1.9* 1.6*  NEUTROABS 2.2 2.3  --   --  2.1 1.4*  --   HGB 10.9* 10.7* 10.2* 9.7* 11.1* 10.0* 10.0*  HCT 32.8* 32.2* 29.7* 28.7* 33.7* 29.6* 29.5*  MCV 94.5 95.8 91.1 92.6 94.7 94.0 92.2  PLT 148* 127* 129* 120* 113* 114* [id number]*   Basic Metabolic Panel:  Recent Labs  Lab 12/17/18 0241 12/18/18 0524 12/20/18 0012 12/20/18 0554 12/21/18 0228  NA 132* 132* 128* 132* 132*  K 3.3* 3.7 3.5 3.4* 3.2*  CL 111 111 104 109 112*  CO2 13* 16* 16* 17* 14*  GLUCOSE 76 69* 89 72 77  BUN 9 9 11 10 11   CREATININE 0.74 0.86 0.83 0.81 0.70  CALCIUM 7.7* 8.1* 8.3* 7.8* 7.8*  MG  --  1.7  --   --  1.7   GFR: Estimated Creatinine Clearance: 38 mL/min (by C-G formula based on SCr of 0.7 mg/dL). Liver Function Tests: Recent Labs  Lab 12/16/18 1115 12/17/18 0241 12/20/18 0012 12/20/18 0554 12/21/18 0228  AST 146* 146* 268* 234* 229*  ALT 74* 75* 135* 119* 114*  ALKPHOS 78 69 81 75 71  BILITOT 1.1 0.6 0.7 0.6 0.7  PROT 5.5* 4.6* 5.6* 4.7* 4.8*  ALBUMIN 2.3* 2.0* 2.3* 2.1* 1.9*   Recent Labs  Lab 12/20/18 0012  LIPASE 123*   No results for input(s): AMMONIA in the last 168 hours. Coagulation Profile: Recent Labs  Lab 12/16/18 1316  INR 1.1   Cardiac Enzymes: Recent Labs  Lab 12/16/18 1115  CKTOTAL 140   BNP (last 3 results) No results for input(s): PROBNP in the last 8760 hours. HbA1C: No results for input(s): HGBA1C in the last 72 hours. CBG: Recent Labs  Lab 12/15/18 0718 12/20/18 0815 12/20/18 0850 12/21/18 0818 12/21/18 1155  GLUCAP 72 63* 70 76 82   Lipid Profile: No results for input(s): CHOL, HDL, LDLCALC, TRIG, CHOLHDL, LDLDIRECT in the last 72 hours. Thyroid Function Tests: No results for input(s): TSH, T4TOTAL, FREET4, T3FREE, THYROIDAB in the last 72 hours. Anemia Panel: Recent Labs    12/20/18 0554  VITAMINB12 435  FOLATE 27.8  FERRITIN 1,758*  TIBC 106*  IRON 51  RETICCTPCT 0.9   Sepsis Labs: Recent Labs  Lab 12/20/18 0012  LATICACIDVEN 1.4    Recent Results (from the past 240 hour(s))  Urine culture     Status: Abnormal   Collection Time: 12/15/18  8:11 AM   Specimen: Urine, Random  Result Value Ref Range Status   Specimen Description URINE, RANDOM  Final   Special Requests NONE  Final    Culture (A)  Final    <10,000 COLONIES/mL INSIGNIFICANT GROWTH Performed at [facility], [place] [address] [place], [place] [address]    Report Status 12/16/2018 FINAL  Final  SARS Coronavirus 2 [facility] order, Performed in [facility] hospital lab) Nasopharyngeal Nasopharyngeal Swab     Status: None   Collection Time: 12/15/18  8:11 AM   Specimen: Nasopharyngeal Swab  Result Value Ref Range Status   SARS Coronavirus 2 NEGATIVE NEGATIVE Final    Comment: (NOTE) If result is NEGATIVE SARS-CoV-2 target nucleic acids are NOT DETECTED. The SARS-CoV-2 RNA is generally detectable in upper and lower  respiratory specimens during the acute phase of infection. The lowest  concentration of SARS-CoV-2 viral copies this assay can detect is 250  copies / mL. A  negative result does not preclude SARS-CoV-2 infection  and should not be used as the sole basis for treatment or other  patient management decisions.  A negative result may occur with  improper specimen collection / handling, submission of specimen other  than nasopharyngeal swab, presence of viral mutation(s) within the  areas targeted by this assay, and inadequate number of viral copies  (<250 copies / mL). A negative result must be combined with clinical  observations, patient history, and epidemiological information. If result is POSITIVE SARS-CoV-2 target nucleic acids are DETECTED. The SARS-CoV-2 RNA is generally detectable in upper and lower  respiratory specimens dur ing the acute phase of infection.  Positive  results are indicative of active infection with SARS-CoV-2.  Clinical  correlation with patient history and other diagnostic information is  necessary to determine patient infection status.  Positive results do  not rule out bacterial infection or co-infection with other viruses. If result is PRESUMPTIVE POSTIVE SARS-CoV-2 nucleic acids MAY BE PRESENT.   A presumptive positive result was obtained on the submitted  specimen  and confirmed on repeat testing.  While 2019 novel coronavirus  (SARS-CoV-2) nucleic acids may be present in the submitted sample  additional confirmatory testing may be necessary for epidemiological  and / or clinical management purposes  to differentiate between  SARS-CoV-2 and other Sarbecovirus currently known to infect humans.  If clinically indicated additional testing with an alternate test  methodology [phone]) is advised. The SARS-CoV-2 RNA is generally  detectable in upper and lower respiratory sp ecimens during the acute  phase of infection. The expected result is Negative. Fact Sheet for Patients:  StrictlyIdeas.no Fact Sheet for Healthcare Providers: BankingDealers.co.za This test is not yet approved or cleared by the [place] FDA and has been authorized for detection and/or diagnosis of SARS-CoV-2 by FDA under an Emergency Use Authorization (EUA).  This EUA will remain in effect (meaning this test can be used) for the duration of the COVID-19 declaration under Section 564(b)(1) of the Act, 21 U.S.C. section 360bbb-3(b)(1), unless the authorization is terminated or revoked sooner. Performed at [facility], [place] [address] [place], [place] [address]   Urine Culture     Status: Abnormal   Collection Time: 12/16/18  1:32 PM   Specimen: Urine, Random  Result Value Ref Range Status   Specimen Description URINE, RANDOM  Final   Special Requests   Final    NONE Performed at [facility], [place] [address] [place], [place] [address]    Culture >=100,000 COLONIES/mL KLEBSIELLA PNEUMONIAE (A)  Final   Report Status 12/20/2018 FINAL  Final   Organism ID, Bacteria KLEBSIELLA PNEUMONIAE (A)  Final      Susceptibility   Klebsiella pneumoniae - MIC*    AMPICILLIN RESISTANT Resistant     CEFAZOLIN <=4 SENSITIVE Sensitive     CEFTRIAXONE <=1 SENSITIVE Sensitive     CIPROFLOXACIN <=0.25 SENSITIVE Sensitive      GENTAMICIN <=1 SENSITIVE Sensitive     IMIPENEM <=0.25 SENSITIVE Sensitive     NITROFURANTOIN <=16 SENSITIVE Sensitive     TRIMETH/SULFA <=20 SENSITIVE Sensitive     AMPICILLIN/SULBACTAM 4 SENSITIVE Sensitive     PIP/TAZO <=4 SENSITIVE Sensitive     Extended ESBL NEGATIVE Sensitive     * >=100,000 COLONIES/mL KLEBSIELLA PNEUMONIAE  Urine Culture     Status: Abnormal   Collection Time: 12/19/18  4:12 AM   Specimen: Urine, Clean Catch  Result Value Ref Range Status   Specimen  Description URINE, CLEAN CATCH  Final   Special Requests NONE  Final   Culture (A)  Final    <10,000 COLONIES/mL INSIGNIFICANT GROWTH Performed at [facility], [place] [address], [place], [place] [address]    Report Status 12/21/2018 FINAL  Final  Blood culture (routine x 2)     Status: None (Preliminary result)   Collection Time: 12/20/18 12:43 AM   Specimen: BLOOD  Result Value Ref Range Status   Specimen Description BLOOD RIGHT ANTECUBITAL  Final   Special Requests   Final    BOTTLES DRAWN AEROBIC AND ANAEROBIC Blood Culture adequate volume   Culture   Final    NO GROWTH 1 DAY Performed at [facility], [place] [address], [place], [place] [address]    Report Status PENDING  Incomplete  SARS CORONAVIRUS 2 (TAT 6-12 HRS) Nasal Swab Aptima Multi Swab     Status: None   Collection Time: 12/20/18  3:52 AM   Specimen: Aptima Multi Swab; Nasal Swab  Result Value Ref Range Status   SARS Coronavirus 2 NEGATIVE NEGATIVE Final    Comment: (NOTE) SARS-CoV-2 target nucleic acids are NOT DETECTED. The SARS-CoV-2 RNA is generally detectable in upper and lower respiratory specimens during the acute phase of infection. Negative results do not preclude SARS-CoV-2 infection, do not rule out co-infections with other pathogens, and should not be used as the sole basis for treatment or other patient management decisions. Negative results must be combined with clinical observations, patient history, and  epidemiological information. The expected result is Negative. Fact Sheet for Patients: SugarRoll.be Fact Sheet for Healthcare Providers: https://www.woods-mathews.com/ This test is not yet approved or cleared by the [place] FDA and  has been authorized for detection and/or diagnosis of SARS-CoV-2 by FDA under an Emergency Use Authorization (EUA). This EUA will remain  in effect (meaning this test can be used) for the duration of the COVID-19 declaration under Section 56 4(b)(1) of the Act, 21 U.S.C. section 360bbb-3(b)(1), unless the authorization is terminated or revoked sooner. Performed at [facility], [place] [address] [place], [place] [address]          Radiology Studies: Ct Abdomen Pelvis W Contrast  Result Date: 12/20/2018 CLINICAL DATA:  Abnormal LFTs, generalized weakness EXAM: CT ABDOMEN AND PELVIS WITH CONTRAST TECHNIQUE: Multidetector CT imaging of the abdomen and pelvis was performed using the standard protocol following bolus administration of intravenous contrast. CONTRAST:  176mL OMNIPAQUE IOHEXOL 300 MG/ML  SOLN COMPARISON:  CT 10/17/2016 FINDINGS: Lower chest: Lung bases demonstrate small bilateral pleural effusions and passive atelectasis within the posterior lower lobes. The heart appears slightly enlarged. Hepatobiliary: Central hepatic cyst. No calcified gallstone or biliary dilatation Pancreas: Unremarkable. No pancreatic ductal dilatation or surrounding inflammatory changes. Spleen: Normal in size without focal abnormality. Adrenals/Urinary Tract: Adrenal glands are unremarkable. Kidneys are normal, without renal calculi, focal lesion, or hydronephrosis. Bladder is unremarkable. Stomach/Bowel: Stomach is nonenlarged. No dilated small bowel. Fluid-filled colon without bowel wall thickening. Appendix not clearly identified but no right lower quadrant inflammatory process. Vascular/Lymphatic: Nonaneurysmal aorta.  Scattered aortic atherosclerosis. No significantly enlarged lymph nodes Reproductive: Calcified uterine fibroids.  No adnexal mass Other: No free air. Probable small amount of free fluid in the pelvis Musculoskeletal: Trace anterolisthesis L3 on L4. Degenerative changes at L3-L4 and L4-L5 IMPRESSION: 1. No CT evident for acute intra-abdominal or pelvic abnormality. Diffuse fluid within the colon suggesting possible enteritis or diarrheal illness 2. Small bilateral pleural effusions with passive  atelectasis at the bases 3. Uterine fibroids Electronically Signed   By: Donavan Foil M.D.   On: 12/20/2018 02:28   Dg Abdomen Acute W/chest  Result Date: 12/20/2018 CLINICAL DATA:  85 year old female with generalized weakness. EXAM: DG ABDOMEN ACUTE W/ 1V CHEST COMPARISON:  CT of the abdomen pelvis dated 10/18/2016 FINDINGS: Probable background of emphysema. Trace right pleural effusion with associated atelectasis. No focal consolidation or pneumothorax. Mild cardiomegaly. Atherosclerotic calcification of the aorta. There is no bowel dilatation or evidence of obstruction. No free air. Probable calcified fibroid in the pelvis. Degenerative changes of the spine and hips. The soft tissues are unremarkable. IMPRESSION: 1. Trace right pleural effusion with associated atelectasis. 2. No bowel obstruction. Electronically Signed   By: Anner Crete M.D.   On: 12/20/2018 00:05        Scheduled Meds: . aspirin EC  81 mg Oral Daily  . clopidogrel  75 mg Oral Daily  . feeding supplement  1 Container Oral TID BM  . feeding supplement (PRO-STAT SUGAR FREE 64)  30 mL Oral BID WC  . folic acid  1 mg Oral Daily  . Gerhardt's butt cream   Topical TID  . multivitamin with minerals  1 tablet Oral Daily  . pantoprazole  20 mg Oral Daily  . sodium chloride flush  3 mL Intravenous Q12H  . sodium chloride flush  3 mL Intravenous Q12H   Continuous Infusions: . sodium chloride       LOS: 1 day        Aline August, MD [facility] 12/21/2018, 2:54 PM

## 2018-12-21 NOTE — Progress Notes (Signed)
Patient ID: Sydney [name], female   DOB: 1930-10-21, 85 y.o.   MRN: [id number]         [facility]ious [facility]  Date of Admission:  12/19/2018    \  ASSESSMENT: The cause of her generalized weakness remains unclear.  I do not see any evidence of symptomatic UTI no recurrence of her C. difficile colitis or other bacterial infection.  She has chronic leukopenia and anemia and now has worsening pancytopenia and elevated liver transaminases.  Hepatitis A, B and C serologies are negative.  I will check EBV and CMV serologies.  PLAN: 1. EBV and CMV serologies  Principal Problem:   Generalized weakness Active Problems:   Pancytopenia (HCC)   Transaminitis   Hyponatremia   Protein calorie malnutrition (HCC)   History of TIA (transient ischemic attack)   Pressure injury of skin   Malnutrition of moderate degree   Scheduled Meds: . aspirin EC  81 mg Oral Daily  . clopidogrel  75 mg Oral Daily  . feeding supplement (ENSURE ENLIVE)  237 mL Oral BID BM  . feeding supplement (PRO-STAT SUGAR FREE 64)  30 mL Oral BID WC  . folic acid  1 mg Oral Daily  . Gerhardt's butt cream   Topical TID  . multivitamin with minerals  1 tablet Oral Daily  . pantoprazole  20 mg Oral Daily  . potassium chloride  40 mEq Oral Q4H  . sodium chloride flush  3 mL Intravenous Q12H  . sodium chloride flush  3 mL Intravenous Q12H   Continuous Infusions: . sodium chloride     PRN Meds:.sodium chloride, acetaminophen **OR** acetaminophen, ondansetron **OR** ondansetron (ZOFRAN) IV, sodium chloride flush   SUBJECTIVE: She has not had any diarrhea, nausea, vomiting or abdominal pain.  She ate virtually none of her breakfast.  Review of Systems: Review of Systems  Constitutional: Positive for malaise/fatigue and weight loss. Negative for chills and fever.  Gastrointestinal: Negative for abdominal pain, diarrhea, nausea and vomiting.    Allergies  Allergen Reactions  . Demerol [Meperidine]  Other (See Comments)    Excessive sweating, cramping  . Other     No Antibiotic without consultation with her primary physician.  . Codeine Rash  . Sulfa Antibiotics Itching and Other (See Comments)    Reaction unknown  . Tomato Itching    OBJECTIVE: Vitals:   12/20/18 1418 12/20/18 2036 12/21/18 0500 12/21/18 0500  BP: (!) 100/59 107/60  (!) 103/54  Pulse: (!) 55 72  60  Resp: 20 15  15   Temp: 97.9 F (36.6 C) (!) 97.5 F (36.4 C)  98.2 F (36.8 C)  TempSrc: Oral Oral  Oral  SpO2: 99% 100%  100%  Weight:   47.7 kg   Height:       Body mass index is 18.63 kg/m.  Physical Exam Constitutional:      Comments: She appears very weak and frail.  She is hard of hearing.  She answers questions appropriately.  Cardiovascular:     Rate and Rhythm: Normal rate and regular rhythm.     Heart sounds: No murmur.  Pulmonary:     Effort: Pulmonary effort is normal.     Breath sounds: Normal breath sounds.  Abdominal:     Palpations: Abdomen is soft.     Tenderness: There is no abdominal tenderness.     Lab Results Lab Results  Component Value Date   WBC 1.6 (L) 12/21/2018   HGB 10.0 (L) 12/21/2018  HCT 29.5 (L) 12/21/2018   MCV 92.2 12/21/2018   PLT 111 (L) 12/21/2018    Lab Results  Component Value Date   CREATININE 0.70 12/21/2018   BUN 11 12/21/2018   NA 132 (L) 12/21/2018   K 3.2 (L) 12/21/2018   CL 112 (H) 12/21/2018   CO2 14 (L) 12/21/2018    Lab Results  Component Value Date   ALT 114 (H) 12/21/2018   AST 229 (H) 12/21/2018   ALKPHOS 71 12/21/2018   BILITOT 0.7 12/21/2018     Microbiology: Recent Results (from the past 240 hour(s))  Urine culture     Status: Abnormal   Collection Time: 12/15/18  8:11 AM   Specimen: Urine, Random  Result Value Ref Range Status   Specimen Description URINE, RANDOM  Final   Special Requests NONE  Final   Culture (A)  Final    <10,000 COLONIES/mL INSIGNIFICANT GROWTH Performed at [facility], [address], [place], [place] [address]    Report Status 12/16/2018 FINAL  Final  SARS Coronavirus 2 [facility] order, Performed in [facility] hospital lab) Nasopharyngeal Nasopharyngeal Swab     Status: None   Collection Time: 12/15/18  8:11 AM   Specimen: Nasopharyngeal Swab  Result Value Ref Range Status   SARS Coronavirus 2 NEGATIVE NEGATIVE Final    Comment: (NOTE) If result is NEGATIVE SARS-CoV-2 target nucleic acids are NOT DETECTED. The SARS-CoV-2 RNA is generally detectable in upper and lower  respiratory specimens during the acute phase of infection. The lowest  concentration of SARS-CoV-2 viral copies this assay can detect is 250  copies / mL. A negative result does not preclude SARS-CoV-2 infection  and should not be used as the sole basis for treatment or other  patient management decisions.  A negative result may occur with  improper specimen collection / handling, submission of specimen other  than nasopharyngeal swab, presence of viral mutation(s) within the  areas targeted by this assay, and inadequate number of viral copies  (<250 copies / mL). A negative result must be combined with clinical  observations, patient history, and epidemiological information. If result is POSITIVE SARS-CoV-2 target nucleic acids are DETECTED. The SARS-CoV-2 RNA is generally detectable in upper and lower  respiratory specimens dur ing the acute phase of infection.  Positive  results are indicative of active infection with SARS-CoV-2.  Clinical  correlation with patient history and other diagnostic information is  necessary to determine patient infection status.  Positive results do  not rule out bacterial infection or co-infection with other viruses. If result is PRESUMPTIVE POSTIVE SARS-CoV-2 nucleic acids MAY BE PRESENT.   A presumptive positive result was obtained on the submitted specimen  and confirmed on repeat testing.  While 2019 novel coronavirus  (SARS-CoV-2) nucleic acids may be present  in the submitted sample  additional confirmatory testing may be necessary for epidemiological  and / or clinical management purposes  to differentiate between  SARS-CoV-2 and other Sarbecovirus currently known to infect humans.  If clinically indicated additional testing with an alternate test  methodology [phone]) is advised. The SARS-CoV-2 RNA is generally  detectable in upper and lower respiratory sp ecimens during the acute  phase of infection. The expected result is Negative. Fact Sheet for Patients:  StrictlyIdeas.no Fact Sheet for Healthcare Providers: BankingDealers.co.za This test is not yet approved or cleared by the [place] FDA and has been authorized for detection and/or diagnosis of SARS-CoV-2 by FDA under an Emergency Use Authorization (  EUA).  This EUA will remain in effect (meaning this test can be used) for the duration of the COVID-19 declaration under Section 564(b)(1) of the Act, 21 U.S.C. section 360bbb-3(b)(1), unless the authorization is terminated or revoked sooner. Performed at [facility], [place] [address] [place], [place] [address]   Urine Culture     Status: Abnormal   Collection Time: 12/16/18  1:32 PM   Specimen: Urine, Random  Result Value Ref Range Status   Specimen Description URINE, RANDOM  Final   Special Requests   Final    NONE Performed at [facility], [place] [address] [place], [place] [address]    Culture >=100,000 COLONIES/mL KLEBSIELLA PNEUMONIAE (A)  Final   Report Status 12/20/2018 FINAL  Final   Organism ID, Bacteria KLEBSIELLA PNEUMONIAE (A)  Final      Susceptibility   Klebsiella pneumoniae - MIC*    AMPICILLIN RESISTANT Resistant     CEFAZOLIN <=4 SENSITIVE Sensitive     CEFTRIAXONE <=1 SENSITIVE Sensitive     CIPROFLOXACIN <=0.25 SENSITIVE Sensitive     GENTAMICIN <=1 SENSITIVE Sensitive     IMIPENEM <=0.25 SENSITIVE Sensitive     NITROFURANTOIN <=16 SENSITIVE  Sensitive     TRIMETH/SULFA <=20 SENSITIVE Sensitive     AMPICILLIN/SULBACTAM 4 SENSITIVE Sensitive     PIP/TAZO <=4 SENSITIVE Sensitive     Extended ESBL NEGATIVE Sensitive     * >=100,000 COLONIES/mL KLEBSIELLA PNEUMONIAE  Urine Culture     Status: Abnormal   Collection Time: 12/19/18  4:12 AM   Specimen: Urine, Clean Catch  Result Value Ref Range Status   Specimen Description URINE, CLEAN CATCH  Final   Special Requests NONE  Final   Culture (A)  Final    <10,000 COLONIES/mL INSIGNIFICANT GROWTH Performed at [facility], [place] [address], [place], [place] [address]    Report Status 12/21/2018 FINAL  Final  SARS CORONAVIRUS 2 (TAT 6-12 HRS) Nasal Swab Aptima Multi Swab     Status: None   Collection Time: 12/20/18  3:52 AM   Specimen: Aptima Multi Swab; Nasal Swab  Result Value Ref Range Status   SARS Coronavirus 2 NEGATIVE NEGATIVE Final    Comment: (NOTE) SARS-CoV-2 target nucleic acids are NOT DETECTED. The SARS-CoV-2 RNA is generally detectable in upper and lower respiratory specimens during the acute phase of infection. Negative results do not preclude SARS-CoV-2 infection, do not rule out co-infections with other pathogens, and should not be used as the sole basis for treatment or other patient management decisions. Negative results must be combined with clinical observations, patient history, and epidemiological information. The expected result is Negative. Fact Sheet for Patients: SugarRoll.be Fact Sheet for Healthcare Providers: https://www.woods-mathews.com/ This test is not yet approved or cleared by the [place] FDA and  has been authorized for detection and/or diagnosis of SARS-CoV-2 by FDA under an Emergency Use Authorization (EUA). This EUA will remain  in effect (meaning this test can be used) for the duration of the COVID-19 declaration under Section 56 4(b)(1) of the Act, 21 U.S.C. section 360bbb-3(b)(1),  unless the authorization is terminated or revoked sooner. Performed at [facility], [place] [address] [place], [place] [address]     Michel Bickers, [facility] [phone] pager   [phone] cell 12/21/2018, 10:59 AM

## 2018-12-21 NOTE — Progress Notes (Signed)
Daily Rounding Note  12/21/2018, 10:01 AM  LOS: 1 day   SUBJECTIVE:   Chief complaint: Elevated transaminases.  Weakness, failure to thrive. Patient denies discomfort, pain.  Had a bowel movement this morning.  Patient also denies feeling sad, she says she just feels tired. The food service tray is sitting on the counter.  It has hardly been touched.  OBJECTIVE:         Vital signs in last 24 hours:    Temp:  [97.5 F (36.4 C)-98.2 F (36.8 C)] 98.2 F (36.8 C) (08/26 0500) Pulse Rate:  [55-72] 60 (08/26 0500) Resp:  [15-20] 15 (08/26 0500) BP: (100-107)/(54-60) 103/54 (08/26 0500) SpO2:  [99 %-100 %] 100 % (08/26 0500) Weight:  [47.7 kg] 47.7 kg (08/26 0500) Last BM Date: 12/21/18 Filed Weights   12/20/18 0956 12/21/18 0500  Weight: 47.9 kg 47.7 kg   General: Cachectic, disengaged, flat affect. Heart: RRR. Chest: Diminished breath sounds but clear.  No labored breathing, no cough. Abdomen: Soft, thin, nontender.  Active bowel sounds. Extremities: No CCE. Neuro/Psych: Oriented x3  Intake/Output from previous day: 08/25 0701 - 08/26 0700 In: 566 [P.O.:560; I.V.:6] Out: 1000 [Urine:1000]  Intake/Output this shift: Total I/O In: 120 [P.O.:120] Out: -   Lab Results: Recent Labs    12/20/18 0012 12/20/18 0554 12/21/18 0228  WBC 3.0* 1.9* 1.6*  HGB 11.1* 10.0* 10.0*  HCT 33.7* 29.6* 29.5*  PLT 113* 114* 111*   BMET Recent Labs    12/20/18 0012 12/20/18 0554 12/21/18 0228  NA 128* 132* 132*  K 3.5 3.4* 3.2*  CL 104 109 112*  CO2 16* 17* 14*  GLUCOSE 89 72 77  BUN 11 10 11   CREATININE 0.83 0.81 0.70  CALCIUM 8.3* 7.8* 7.8*   LFT Recent Labs    12/20/18 0012 12/20/18 0554 12/21/18 0228  PROT 5.6* 4.7* 4.8*  ALBUMIN 2.3* 2.1* 1.9*  AST 268* 234* 229*  ALT 135* 119* 114*  ALKPHOS 81 75 71  BILITOT 0.7 0.6 0.7    Studies/Results: Ct Abdomen Pelvis W Contrast  Result Date: 12/20/2018  CLINICAL DATA:  Abnormal LFTs, generalized weakness EXAM: CT ABDOMEN AND PELVIS WITH CONTRAST TECHNIQUE: Multidetector CT imaging of the abdomen and pelvis was performed using the standard protocol following bolus administration of intravenous contrast. CONTRAST:  139mL OMNIPAQUE IOHEXOL 300 MG/ML  SOLN COMPARISON:  CT 10/17/2016 FINDINGS: Lower chest: Lung bases demonstrate small bilateral pleural effusions and passive atelectasis within the posterior lower lobes. The heart appears slightly enlarged. Hepatobiliary: Central hepatic cyst. No calcified gallstone or biliary dilatation Pancreas: Unremarkable. No pancreatic ductal dilatation or surrounding inflammatory changes. Spleen: Normal in size without focal abnormality. Adrenals/Urinary Tract: Adrenal glands are unremarkable. Kidneys are normal, without renal calculi, focal lesion, or hydronephrosis. Bladder is unremarkable. Stomach/Bowel: Stomach is nonenlarged. No dilated small bowel. Fluid-filled colon without bowel wall thickening. Appendix not clearly identified but no right lower quadrant inflammatory process. Vascular/Lymphatic: Nonaneurysmal aorta. Scattered aortic atherosclerosis. No significantly enlarged lymph nodes Reproductive: Calcified uterine fibroids.  No adnexal mass Other: No free air. Probable small amount of free fluid in the pelvis Musculoskeletal: Trace anterolisthesis L3 on L4. Degenerative changes at L3-L4 and L4-L5 IMPRESSION: 1. No CT evident for acute intra-abdominal or pelvic abnormality. Diffuse fluid within the colon suggesting possible enteritis or diarrheal illness 2. Small bilateral pleural effusions with passive atelectasis at the bases 3. Uterine fibroids Electronically Signed   By: Madie Reno.D.  On: 12/20/2018 02:28   Dg Abdomen Acute W/chest  Result Date: 12/20/2018 CLINICAL DATA:  85 year old female with generalized weakness. EXAM: DG ABDOMEN ACUTE W/ 1V CHEST COMPARISON:  CT of the abdomen pelvis dated 10/18/2016  FINDINGS: Probable background of emphysema. Trace right pleural effusion with associated atelectasis. No focal consolidation or pneumothorax. Mild cardiomegaly. Atherosclerotic calcification of the aorta. There is no bowel dilatation or evidence of obstruction. No free air. Probable calcified fibroid in the pelvis. Degenerative changes of the spine and hips. The soft tissues are unremarkable. IMPRESSION: 1. Trace right pleural effusion with associated atelectasis. 2. No bowel obstruction. Electronically Signed   By: Anner Crete M.D.   On: 12/20/2018 00:05   Scheduled Meds: . aspirin EC  81 mg Oral Daily  . clopidogrel  75 mg Oral Daily  . feeding supplement (ENSURE ENLIVE)  237 mL Oral BID BM  . feeding supplement (PRO-STAT SUGAR FREE 64)  30 mL Oral BID WC  . folic acid  1 mg Oral Daily  . Gerhardt's butt cream   Topical TID  . multivitamin with minerals  1 tablet Oral Daily  . pantoprazole  20 mg Oral Daily  . potassium chloride  40 mEq Oral Q4H  . sodium chloride flush  3 mL Intravenous Q12H  . sodium chloride flush  3 mL Intravenous Q12H   Continuous Infusions: . sodium chloride     PRN Meds:.sodium chloride, acetaminophen **OR** acetaminophen, ondansetron **OR** ondansetron (ZOFRAN) IV, sodium chloride flush   ASSESMENT:   *      Elevated transaminases of unclear etiology.  Lipase elevated at 123 question mild shock liver/hypoperfusion.  Elevation predates antibiotics of Rocephin, Keflex for Klebsiella UTI CT scan shows central hepatic cyst.  No gallstones, no biliary dilatation dilatation.  Normal pancreas and pancreatic duct. Minor improvement of transaminases over 24 hours.  *     Protein calorie malnutrition.  Prealbumin less than 5.  Poor appetite and p.o. intake for several weeks  *     Forward in colon without bowel wall thickening or changes to suggest colitis.  *     Weakness, fatigue.  FTT.  ?  Depression in setting of recent death of her oldest sister.  Per myself  and discussion with her nurse, the patient almost seems vegetative  *      Pancytopenia.  *     Hyponatremia, hypotension.  *    Chronic Plavix   PLAN   *     Again consider psychiatric evaluation and/or initiation of antidepressants with side effect of appetite stimulation.   Consider Megace but should we obtain cortisol level prior to initiation??    Sydney [name]  12/21/2018, 10:01 AM Phone [phone]

## 2018-12-21 NOTE — TOC Initial Note (Addendum)
Transition of Care [facility]) - Initial/Assessment Note    Patient Details  Name: Sydney [name] MRN: [id number] Date of Birth: [birth year]  [facility] CM/SW Contact:    Sydney Favre, RN Phone Number: 12/21/2018, 4:15 PM  Clinical Narrative:                 Spoke to patient at bedside with daughter present.   Patient was discharged Sunday with [facility] [place] will need new orders to resume care. Patient has walker and 3 in 1 at home  Patient had RN,PT,OT Expected Discharge Plan: [place]     Patient Goals and CMS Choice Patient states their goals for this hospitalization and ongoing recovery are:: to go home CMS Medicare.gov Compare Post Acute Care list provided to:: Patient Choice offered to / list presented to : Patient, Spouse  Expected Discharge Plan and Services Expected Discharge Plan: [place] Choice: [place] arrangements for the past 2 months: Single Family Home                 DME Arranged: N/A           HH Agency: [place] ([place]) Date [place]: 12/21/18 Time [place]: [place] Representative spoke with at [place]: [place] Arrangements/Services Living arrangements for the past 2 months: [place] with:: Adult Children Patient language and need for interpreter reviewed:: Yes Do you feel safe going back to the place where you live?: Yes      Need for Family Participation in Patient Care: Yes (Comment) Care giver support system in place?: Yes (comment)   Criminal Activity/Legal Involvement Pertinent to Current Situation/Hospitalization: No - Comment as needed  Activities of Daily Living      Permission Sought/Granted   Permission granted to share information with : Yes, Verbal Permission Granted  Share Information with NAME: daughter Sydney [name]  Permission granted to share info w AGENCY: [facility]        Emotional Assessment Appearance:: Appears stated age Attitude/Demeanor/Rapport: Engaged Affect (typically observed): Accepting Orientation: : Oriented to Self, Oriented to Place, Oriented to  Time, Oriented to Situation Alcohol / Substance Use: Not Applicable Psych Involvement: No (comment)  Admission diagnosis:  Dehydration [E86.0] Elevated LFTs [R94.5] Near syncope [R55] Patient Active Problem List   Diagnosis Date Noted  . Malnutrition of moderate degree 12/20/2018  . Elevated LFTs   . Near syncope   . General weakness 12/17/2018  . Generalized weakness 12/16/2018  . Pressure injury of skin 12/16/2018  . Transaminitis 12/16/2018  . Ataxia 10/02/2017  . History of TIA (transient ischemic attack) 10/02/2017  . HTN (hypertension) 10/02/2017  . HLD (hyperlipidemia) 10/02/2017  . Palpable mass of neck-?? lymph node 10/02/2017  . Protein-calorie malnutrition, severe 10/20/2016  . Hypertension 10/17/2016  . High cholesterol 10/17/2016  . History of Clostridioides difficile colitis 10/17/2016  . Dehydration with hyponatremia 10/17/2016  . Acute hypokalemia 10/17/2016  . Protein calorie malnutrition ([place]) 10/17/2016  . C. difficile colitis 10/05/2016  . Physical deconditioning 10/05/2016  . Recurrent Clostridium difficile diarrhea 10/05/2016  . Balance problem 10/05/2016  . History of transient ischemic attack (TIA) 10/05/2016  . Dehydration   . Hypokalemia 05/14/2016  . Anemia 05/14/2016  . Hyponatremia 05/14/2016  . Glaucoma 05/14/2016  . Meningioma ([place]) 05/01/2015  . 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 Diplopia 04/08/2015  .  Pancytopenia ([place]) 04/08/2015  . Bradycardia 02/17/2013  . Occlusion and stenosis of carotid artery without mention of cerebral infarction 02/17/2013   PCP:  Sydney Low, MD Pharmacy:   [facility] [place], [place] - [place]  [address] Phone: [phone] Fax: [phone]     Social Determinants of Health (SDOH) Interventions    Readmission Risk Interventions No flowsheet data found.

## 2018-12-22 ENCOUNTER — Encounter (HOSPITAL_BASED_OUTPATIENT_CLINIC_OR_DEPARTMENT_OTHER): Payer: Medicare Other

## 2018-12-22 ENCOUNTER — Encounter (HOSPITAL_COMMUNITY): Payer: Self-pay | Admitting: Oncology

## 2018-12-22 DIAGNOSIS — E440 Moderate protein-calorie malnutrition: Secondary | ICD-10-CM | POA: Diagnosis not present

## 2018-12-22 DIAGNOSIS — D649 Anemia, unspecified: Secondary | ICD-10-CM | POA: Diagnosis not present

## 2018-12-22 DIAGNOSIS — D61818 Other pancytopenia: Secondary | ICD-10-CM | POA: Diagnosis not present

## 2018-12-22 DIAGNOSIS — D72819 Decreased white blood cell count, unspecified: Secondary | ICD-10-CM | POA: Diagnosis not present

## 2018-12-22 DIAGNOSIS — R945 Abnormal results of liver function studies: Secondary | ICD-10-CM | POA: Diagnosis not present

## 2018-12-22 DIAGNOSIS — R531 Weakness: Secondary | ICD-10-CM | POA: Diagnosis not present

## 2018-12-22 DIAGNOSIS — R740 Nonspecific elevation of levels of transaminase and lactic acid dehydrogenase [LDH]: Secondary | ICD-10-CM | POA: Diagnosis not present

## 2018-12-22 DIAGNOSIS — E871 Hypo-osmolality and hyponatremia: Secondary | ICD-10-CM | POA: Diagnosis not present

## 2018-12-22 DIAGNOSIS — Z681 Body mass index (BMI) 19 or less, adult: Secondary | ICD-10-CM

## 2018-12-22 LAB — COMPREHENSIVE METABOLIC PANEL
ALT: 136 U/L — ABNORMAL HIGH (ref 0–44)
AST: 287 U/L — ABNORMAL HIGH (ref 15–41)
Albumin: 2.1 g/dL — ABNORMAL LOW (ref 3.5–5.0)
Alkaline Phosphatase: 83 U/L (ref 38–126)
Anion gap: 5 (ref 5–15)
BUN: 13 mg/dL (ref 8–23)
CO2: 15 mmol/L — ABNORMAL LOW (ref 22–32)
Calcium: 8.2 mg/dL — ABNORMAL LOW (ref 8.9–10.3)
Chloride: 114 mmol/L — ABNORMAL HIGH (ref 98–111)
Creatinine, Ser: 0.7 mg/dL (ref 0.44–1.00)
GFR calc Af Amer: >60 mL/min (ref >60)
GFR calc non Af Amer: >60 mL/min (ref >60)
Glucose, Bld: 81 mg/dL (ref 70–99)
Potassium: 3.6 mmol/L (ref 3.5–5.1)
Sodium: 134 mmol/L — ABNORMAL LOW (ref 135–145)
Total Bilirubin: 0.5 mg/dL (ref 0.3–1.2)
Total Protein: 4.9 g/dL — ABNORMAL LOW (ref 6.5–8.1)

## 2018-12-22 LAB — CBC WITH DIFFERENTIAL/PLATELET
Abs Immature Granulocytes: 0 10*3/uL (ref 0.00–0.07)
Basophils Absolute: 0 10*3/uL (ref 0.0–0.1)
Basophils Relative: 0 %
Eosinophils Absolute: 0.1 10*3/uL (ref 0.0–0.5)
Eosinophils Relative: 3 %
HCT: 31.3 % — ABNORMAL LOW (ref 36.0–46.0)
Hemoglobin: 10.5 g/dL — ABNORMAL LOW (ref 12.0–15.0)
Immature Granulocytes: 0 %
Lymphocytes Relative: 15 %
Lymphs Abs: 0.3 10*3/uL — ABNORMAL LOW (ref 0.7–4.0)
MCH: 31.1 pg (ref 26.0–34.0)
MCHC: 33.5 g/dL (ref 30.0–36.0)
MCV: 92.6 fL (ref 80.0–100.0)
Monocytes Absolute: 0.1 10*3/uL (ref 0.1–1.0)
Monocytes Relative: 7 %
Neutro Abs: 1.4 10*3/uL — ABNORMAL LOW (ref 1.7–7.7)
Neutrophils Relative %: 75 %
Platelets: 101 10*3/uL — ABNORMAL LOW (ref 150–400)
RBC: 3.38 MIL/uL — ABNORMAL LOW (ref 3.87–5.11)
RDW: 14.7 % (ref 11.5–15.5)
WBC: 1.9 10*3/uL — ABNORMAL LOW (ref 4.0–10.5)
nRBC: 0 % (ref 0.0–0.2)

## 2018-12-22 LAB — EPSTEIN-BARR VIRUS (EBV) ANTIBODY PROFILE
EBV NA IgG: >600 U/mL — ABNORMAL HIGH (ref 0.0–17.9)
EBV VCA IgG: 248 U/mL — ABNORMAL HIGH (ref 0.0–17.9)
EBV VCA IgM: <36 U/mL (ref 0.0–35.9)

## 2018-12-22 LAB — GLUCOSE, CAPILLARY: Glucose-Capillary: 71 mg/dL (ref 70–99)

## 2018-12-22 LAB — MAGNESIUM: Magnesium: 1.8 mg/dL (ref 1.7–2.4)

## 2018-12-22 LAB — CMV IGM: CMV IgM: <30 AU/mL (ref 0.0–29.9)

## 2018-12-22 MED ORDER — SODIUM BICARBONATE 650 MG PO TABS
650.0000 mg | ORAL_TABLET | Freq: Two times a day (BID) | ORAL | Status: DC
  Filled 2018-12-22 (×3): qty 1

## 2018-12-22 MED ORDER — MEGESTROL ACETATE 40 MG PO TABS
40.0000 mg | ORAL_TABLET | Freq: Every day | ORAL | Status: DC
  Filled 2018-12-22 (×3): qty 1

## 2018-12-22 MED ORDER — DEXTROSE-NACL 5-0.9 % IV SOLN
INTRAVENOUS | Status: DC
  Administered 2018-12-22: 10:00:00 via INTRAVENOUS

## 2018-12-22 MED ORDER — CYANOCOBALAMIN 1000 MCG/ML IJ SOLN
1000.0000 ug | Freq: Every day | INTRAMUSCULAR | Status: AC
  Administered 2018-12-22 – 2018-12-24 (×3): 1000 ug via INTRAMUSCULAR
  Filled 2018-12-22 (×3): qty 1

## 2018-12-22 NOTE — Plan of Care (Signed)

## 2018-12-22 NOTE — Evaluation (Signed)
Occupational Therapy Evaluation Patient Details Name: Sydney [name] MRN: [id number] DOB: February 10, 1931 Today's Date: 12/22/2018    History of Present Illness Sydney [name] is a 85 y.o. 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   Clinical Impression   Pt with decline in function and safety with ADLs and ADL mobility with impaired strength, balance, endurance and cognition. PTA, pt lived at home with her daughter and was independent with ADLs/selfcare and mobility with no AD. Pt enjoyed cooking ad reported by her [name] who is present this session. Pt now requires mod A with LB ADLs and toileting, min guard A with UB ADLs and min - min guard A with mobility using a RW. Pt would benefit from acute OT services to address impairments to maximize level of function and safety    Follow Up Recommendations  Home health OT;Supervision/Assistance - 24 hour    Equipment Recommendations  3 in 1 bedside commode    Recommendations for Other Services       Precautions / Restrictions Precautions Precautions: Fall Precaution Comments: had near syncopal episode on [facility] at home before coming in Restrictions Weight Bearing Restrictions: No      Mobility Bed Mobility Overal bed mobility: Needs Assistance Bed Mobility: Supine to Sit;Sit to Supine     Supine to sit: Min assist     General bed mobility comments: min A to initiate movement to EOB and HHA for elevation of trunk to sitting.  Transfers Overall transfer level: Needs assistance Equipment used: Rolling walker (2 wheeled) Transfers: Sit to/from Stand Sit to Stand: Min assist;Min guard         General transfer comment: pt needed increased time and vc's for  hand placement. Stood very slowly to [employer]. Min A to stand from EOB initially    Balance Overall balance assessment: Needs assistance Sitting-balance support: Single extremity supported Sitting balance-Leahy Scale: Fair     Standing balance support: During functional activity;Single extremity supported;Bilateral upper extremity supported Standing balance-Leahy Scale: Poor                             ADL either performed or assessed with clinical judgement   ADL Overall ADL's : Needs assistance/impaired     Grooming: Min guard;Sitting   Upper Body Bathing: Sitting;Min guard   Lower Body Bathing: Sit to/from stand;Moderate assistance   Upper Body Dressing : Min guard;Sitting   Lower Body Dressing: Moderate assistance;Sit to/from stand   Toilet Transfer: Minimal assistance;Min guard;Ambulation;RW;BSC   Toileting- Water quality scientist and Hygiene: Sit to/from stand;Moderate assistance;Cueing for safety       Functional mobility during ADLs: Minimal assistance;Min guard;Rolling walker;Cueing for safety General ADL Comments: pt limited by generalized weakness and balance      Vision Baseline Vision/History: Wears glasses Patient Visual Report: No change from baseline       Perception     Praxis      Pertinent Vitals/Pain Pain Assessment: No/denies pain     Hand Dominance Right   Extremity/Trunk Assessment Upper Extremity Assessment Upper Extremity Assessment: Generalized weakness   Lower Extremity Assessment Lower Extremity Assessment: Defer to PT evaluation   Cervical / Trunk Assessment Cervical / Trunk Assessment: Normal   Communication Communication Communication: [facility]  Cognition Arousal/Alertness: Awake/alert Behavior During Therapy: WFL for tasks assessed/performed Overall Cognitive Status: Impaired/Different from baseline Area of Impairment: Following commands;Problem solving                       Following Commands: Follows  one step commands with increased time;Follows multi-step commands with increased time     Problem Solving: Slow processing;Decreased initiation;Requires verbal cues;Requires tactile cues General Comments: pt appropriate when answering questions but does not speak unless spoken to first. Daughter reports that over past 2 weeks pt has become much slower and with decreased conversation. Has some difficulty initiating movement and follows commands with increased time   General Comments       Exercises     Shoulder Instructions      Home Living Family/patient expects to be discharged to:: Private residence Living Arrangements: Children Available Help at Discharge: Family;Available 24 hours/day Type of Home: House Home Access: Stairs to enter [employer] of Steps: 4 Entrance Stairs-Rails: Right;Left Home Layout: One level     Bathroom Shower/Tub: Tub only;Other (comment)(shower head does not work)   Location manager: Grab bars - tub/shower   Additional Comments: daughter is currently working from home but prior to covid worked out of town and pt was home 12+ hrs a day on her own      Prior Functioning/Environment Level of Independence: Needs assistance  Gait / Transfers Assistance Needed: prior to past month was independent with no AD and walked with daughter daily. Daughter reports is now shuffling with RW and supervision ADL's / Homemaking Assistance Needed: was independent but is no longer cooking because of fatigue and needs some assist with ADL's   Comments: very independent with ADLs, IADLs and mobility        OT Problem List: Decreased strength;Decreased activity tolerance;Impaired balance (sitting and/or standing);Decreased knowledge of use of DME or AE;Decreased knowledge of precautions      OT Treatment/Interventions: Self-care/ADL training;DME and/or AE instruction;Balance training;Patient/family education;Therapeutic  activities;Therapeutic exercise    OT Goals(Current goals can be found in the care plan section) Acute Rehab OT Goals Patient Stated Goal: none stated but daughter wants to find out what is causing her mother's weakness/ fatigue OT Goal Formulation: With patient Time For Goal Achievement: 12/27/18 ADL Goals Pt Will Perform Grooming: with supervision;with set-up;sitting;with caregiver independent in assisting Pt Will Perform Upper Body Bathing: with supervision;with set-up;sitting;with caregiver independent in assisting Pt Will Perform Lower Body Bathing: with min assist;with caregiver independent in assisting;sitting/lateral leans;sit to/from stand Pt Will Perform Upper Body Dressing: with set-up;with supervision;with caregiver independent in assisting Pt Will Perform Lower Body Dressing: with min assist;with caregiver independent in assisting;sitting/lateral leans;sit to/from stand Pt Will Transfer to Toilet: with min guard assist;with supervision;ambulating;bedside commode;regular height toilet;grab bars Pt Will Perform Toileting - Clothing Manipulation and hygiene: with min assist;with min guard assist;sit to/from stand;with caregiver independent in assisting Additional ADL Goal #1: Pt will complete bed mobility with sup to sit EOB for ADLs and in prep for transfers  OT Frequency: Min 2X/week   Barriers to D/C:            Co-evaluation              AM-PAC OT "6 Clicks" Daily Activity     Outcome Measure Help from another person eating meals?: None Help from another person taking care of personal grooming?: A Little Help from another person toileting, which includes using toliet, bedpan, or  urinal?: A Lot Help from another person bathing (including washing, rinsing, drying)?: A Lot Help from another person to put on and taking off regular upper body clothing?: A Little Help from another person to put on and taking off regular lower body clothing?: Total 6 Click Score: 15    End of Session Equipment Utilized During Treatment: Gait belt;Rolling walker;Other (comment)(BSC)  Activity Tolerance: Patient limited by fatigue Patient left: in chair;with call bell/phone within reach;with family/visitor present  OT Visit Diagnosis: Muscle weakness (generalized) (M62.81);Unsteadiness on feet (R26.81);Other symptoms and signs involving cognitive function                Time: [id number] OT Time Calculation (min): 44 min Charges:  OT General Charges $OT Visit: 1 Visit OT Evaluation $OT Eval Moderate Complexity: 1 Mod OT Treatments $Self Care/Home Management : 8-22 mins    [name] 12/22/2018, 2:44 PM

## 2018-12-22 NOTE — Progress Notes (Signed)
Patient ID: Sydney [name], female   DOB: July 11, 1930, 85 y.o.   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  CT of the abdomen and pelvis was negative for acute intra-abdominal or pelvic abnormality.  She was found to have mildly elevated LFTs along with hyponatremia and pancytopenia.  She was given IV fluids.  ID and GI were consulted.  Assessment & Plan:   Generalized weakness Protein calorie malnutrition/failure to thrive -Questionable cause. -There might be a component of depression as well leading to very poor oral intake.  Discussed the same with [name].  She had initially wanted to discuss with PCP before starting antidepressant.  Today she is requesting psychiatric evaluation.  Will request psychiatry consultation. -CT of the head was negative for acute intracranial abnormality. -Daughter is concerned about her acute change in ability to walk and worsening weakness.  MRI of the brain showed no evidence of acute stroke. -No evidence of UTI.  Monitor off antibiotics. -PT/OT evaluation.  Patient looks very deconditioned and might benefit from SNF placement. -Oral intake is still poor.  Was hypoglycemic this morning.  We will switch IV fluids to D5 normal saline at 75 cc an hour.  Encourage patient to increase oral intake. -Nutrition consult -Overall prognosis is guarded to poor.  Palliative care consultation is pending.  Elevated LFTs -LFTs still elevated.  Abdominal exam is benign.  GI following.  Hepatitis panel negative.  Follow EBV and CMV serologies. -Monitor LFTs. -CT of the abdomen with no evidence of acute  intra-abdominal or pelvic abnormality but with diffuse fluid within the colon suggestive of possible enteritis or diarrheal illness  Pancytopenia -Questionable cause.  Monitor.  Spoke to Dr. Gudena/hematology and have requested consultation.  Hyponatremia -Sodium 128 in the ED.  134 today.  IV fluids as above  History of TIA -continue aspirin and Plavix  Stage II sacral pressure injury: Present on admission -Wound care as per wound care consult recommendations.    DVT prophylaxis: SCDs Code Status: Full Family Communication: Spoke to daughter at bedside and again on phone on 12/22/2018 Disposition Plan: Home versus SNF in 1 to 2 days if clinically improves  Consultants: ID/GI/hematology/palliative care  Procedures: None  Antimicrobials: None   Subjective: Patient seen and examined at bedside.  She is awake, poor historian.  Nursing staff still reports very poor oral intake.  No diarrhea.  No fever or vomiting reported. Objective: Vitals:   12/21/18 1616 12/21/18 2107 12/22/18 0358 12/22/18 0618  BP: (!) 104/54 114/68 (!) 102/54   Pulse: 72 78 81   Resp:  (!) 22 20   Temp: 99.8 F (37.7 C) 98.4 F (36.9 C) 98.3 F (36.8 C)   TempSrc: Oral Oral Oral   SpO2: 100% 99% 100%   Weight:    49 kg  Height:        Intake/Output Summary (Last 24 hours) at 12/22/2018 1353 Last data filed at 12/22/2018 1300 Gross per 24 hour  Intake 266.97 ml  Output 450 ml  Net -183.03 ml   Filed Weights   12/20/18 0956 12/21/18 0500 12/22/18 0618  Weight: 47.9 kg 47.7 kg 49 kg    Examination:  General exam: No  distress.  Looks older than stated age.  Very poor historian. Respiratory system: Bilateral decreased breath sounds at bases with some scattered crackles.  No wheezing Cardiovascular system: Rate controlled, S1-S2 heard Gastrointestinal system: Abdomen is nondistended, soft and nontender. Normal bowel sounds heard. Extremities: No cyanosis, edema   Data Reviewed: I have  personally reviewed following labs and imaging studies  CBC: Recent Labs  Lab 12/16/18 1115  12/18/18 0524 12/20/18 0012 12/20/18 0554 12/21/18 0228 12/22/18 0301  WBC 3.4*   < > 2.7* 3.0* 1.9* 1.6* 1.9*  NEUTROABS 2.3  --   --  2.1 1.4*  --  1.4*  HGB 10.7*   < > 9.7* 11.1* 10.0* 10.0* 10.5*  HCT 32.2*   < > 28.7* 33.7* 29.6* 29.5* 31.3*  MCV 95.8   < > 92.6 94.7 94.0 92.2 92.6  PLT 127*   < > 120* 113* 114* 111* 101*   < > = values in this interval not displayed.   Basic Metabolic Panel: Recent Labs  Lab 12/18/18 0524 12/20/18 0012 12/20/18 0554 12/21/18 0228 12/22/18 0301  NA 132* 128* 132* 132* 134*  K 3.7 3.5 3.4* 3.2* 3.6  CL 111 104 109 112* 114*  CO2 16* 16* 17* 14* 15*  GLUCOSE 69* 89 72 77 81  BUN 9 11 10 11 13   CREATININE 0.86 0.83 0.81 0.70 0.70  CALCIUM 8.1* 8.3* 7.8* 7.8* 8.2*  MG 1.7  --   --  1.7 1.8   GFR: Estimated Creatinine Clearance: 39 mL/min (by C-G formula based on SCr of 0.7 mg/dL). Liver Function Tests: Recent Labs  Lab 12/17/18 0241 12/20/18 0012 12/20/18 0554 12/21/18 0228 12/22/18 0301  AST 146* 268* 234* 229* 287*  ALT 75* 135* 119* 114* 136*  ALKPHOS 69 81 75 71 83  BILITOT 0.6 0.7 0.6 0.7 0.5  PROT 4.6* 5.6* 4.7* 4.8* 4.9*  ALBUMIN 2.0* 2.3* 2.1* 1.9* 2.1*   Recent Labs  Lab 12/20/18 0012  LIPASE 123*   No results for input(s): AMMONIA in the last 168 hours. Coagulation Profile: Recent Labs  Lab 12/16/18 1316  INR 1.1   Cardiac Enzymes: Recent Labs  Lab 12/16/18 1115  CKTOTAL 140   BNP (last 3 results) No results for input(s): PROBNP in the last 8760 hours. HbA1C: No results for input(s): HGBA1C in the last 72 hours. CBG: Recent Labs  Lab 12/20/18 0815 12/20/18 0850 12/21/18 0818 12/21/18 1155 12/22/18 0823  GLUCAP 63* 70 76 82 71   Lipid Profile: No results for input(s): CHOL, HDL, LDLCALC, TRIG, CHOLHDL, LDLDIRECT in the last 72 hours. Thyroid Function Tests: No results for input(s): TSH,  T4TOTAL, FREET4, T3FREE, THYROIDAB in the last 72 hours. Anemia Panel: Recent Labs    12/20/18 0554  VITAMINB12 435  FOLATE 27.8  FERRITIN 1,758*  TIBC 106*  IRON 51  RETICCTPCT 0.9   Sepsis Labs: Recent Labs  Lab 12/20/18 0012  LATICACIDVEN 1.4    Recent Results (from the past 240 hour(s))  Urine culture     Status: Abnormal   Collection Time: 12/15/18  8:11 AM   Specimen: Urine, Random  Result Value Ref Range Status   Specimen Description URINE, RANDOM  Final   Special Requests NONE  Final   Culture (A)  Final    <10,000 COLONIES/mL INSIGNIFICANT GROWTH Performed at [facility], [place] [address] [place], [place] [address]    Report Status 12/16/2018 FINAL  Final  SARS Coronavirus 2 [facility] order, Performed in [facility]  hospital lab) Nasopharyngeal Nasopharyngeal Swab     Status: None   Collection Time: 12/15/18  8:11 AM   Specimen: Nasopharyngeal Swab  Result Value Ref Range Status   SARS Coronavirus 2 NEGATIVE NEGATIVE Final    Comment: (NOTE) If result is NEGATIVE SARS-CoV-2 target nucleic acids are NOT DETECTED. The SARS-CoV-2 RNA is generally detectable in upper and lower  respiratory specimens during the acute phase of infection. The lowest  concentration of SARS-CoV-2 viral copies this assay can detect is 250  copies / mL. A negative result does not preclude SARS-CoV-2 infection  and should not be used as the sole basis for treatment or other  patient management decisions.  A negative result may occur with  improper specimen collection / handling, submission of specimen other  than nasopharyngeal swab, presence of viral mutation(s) within the  areas targeted by this assay, and inadequate number of viral copies  (<250 copies / mL). A negative result must be combined with clinical  observations, patient history, and epidemiological information. If result is POSITIVE SARS-CoV-2 target nucleic acids are DETECTED. The SARS-CoV-2 RNA is generally  detectable in upper and lower  respiratory specimens dur ing the acute phase of infection.  Positive  results are indicative of active infection with SARS-CoV-2.  Clinical  correlation with patient history and other diagnostic information is  necessary to determine patient infection status.  Positive results do  not rule out bacterial infection or co-infection with other viruses. If result is PRESUMPTIVE POSTIVE SARS-CoV-2 nucleic acids MAY BE PRESENT.   A presumptive positive result was obtained on the submitted specimen  and confirmed on repeat testing.  While 2019 novel coronavirus  (SARS-CoV-2) nucleic acids may be present in the submitted sample  additional confirmatory testing may be necessary for epidemiological  and / or clinical management purposes  to differentiate between  SARS-CoV-2 and other Sarbecovirus currently known to infect humans.  If clinically indicated additional testing with an alternate test  methodology [phone]) is advised. The SARS-CoV-2 RNA is generally  detectable in upper and lower respiratory sp ecimens during the acute  phase of infection. The expected result is Negative. Fact Sheet for Patients:  StrictlyIdeas.no Fact Sheet for Healthcare Providers: BankingDealers.co.za This test is not yet approved or cleared by the [place] FDA and has been authorized for detection and/or diagnosis of SARS-CoV-2 by FDA under an Emergency Use Authorization (EUA).  This EUA will remain in effect (meaning this test can be used) for the duration of the COVID-19 declaration under Section 564(b)(1) of the Act, 21 U.S.C. section 360bbb-3(b)(1), unless the authorization is terminated or revoked sooner. Performed at [facility], [place] [address] [place], [place] [address]   Urine Culture     Status: Abnormal   Collection Time: 12/16/18  1:32 PM   Specimen: Urine, Random  Result Value Ref Range Status   Specimen  Description URINE, RANDOM  Final   Special Requests   Final    NONE Performed at [facility], [place] [address] [place],  [address]    Culture >=100,000 COLONIES/mL KLEBSIELLA PNEUMONIAE (A)  Final   Report Status 12/20/2018 FINAL  Final   Organism ID, Bacteria KLEBSIELLA PNEUMONIAE (A)  Final      Susceptibility   Klebsiella pneumoniae - MIC*    AMPICILLIN RESISTANT Resistant     CEFAZOLIN <=4 SENSITIVE Sensitive     CEFTRIAXONE <=1 SENSITIVE Sensitive     CIPROFLOXACIN <=0.25 SENSITIVE Sensitive     GENTAMICIN <=  1 SENSITIVE Sensitive     IMIPENEM <=0.25 SENSITIVE Sensitive     NITROFURANTOIN <=16 SENSITIVE Sensitive     TRIMETH/SULFA <=20 SENSITIVE Sensitive     AMPICILLIN/SULBACTAM 4 SENSITIVE Sensitive     PIP/TAZO <=4 SENSITIVE Sensitive     Extended ESBL NEGATIVE Sensitive     * >=100,000 COLONIES/mL KLEBSIELLA PNEUMONIAE  Urine Culture     Status: Abnormal   Collection Time: 12/19/18  4:12 AM   Specimen: Urine, Clean Catch  Result Value Ref Range Status   Specimen Description URINE, CLEAN CATCH  Final   Special Requests NONE  Final   Culture (A)  Final    <10,000 COLONIES/mL INSIGNIFICANT GROWTH Performed at [facility], [place] [address], [place], [place] [address]    Report Status 12/21/2018 FINAL  Final  Blood culture (routine x 2)     Status: None (Preliminary result)   Collection Time: 12/20/18 12:43 AM   Specimen: BLOOD  Result Value Ref Range Status   Specimen Description BLOOD RIGHT ANTECUBITAL  Final   Special Requests   Final    BOTTLES DRAWN AEROBIC AND ANAEROBIC Blood Culture adequate volume   Culture   Final    NO GROWTH 2 DAYS Performed at [facility], [place] [address], [place], [place] [address]    Report Status PENDING  Incomplete  SARS CORONAVIRUS 2 (TAT 6-12 HRS) Nasal Swab Aptima Multi Swab     Status: None   Collection Time: 12/20/18  3:52 AM   Specimen: Aptima Multi Swab; Nasal Swab  Result Value Ref Range Status   SARS  Coronavirus 2 NEGATIVE NEGATIVE Final    Comment: (NOTE) SARS-CoV-2 target nucleic acids are NOT DETECTED. The SARS-CoV-2 RNA is generally detectable in upper and lower respiratory specimens during the acute phase of infection. Negative results do not preclude SARS-CoV-2 infection, do not rule out co-infections with other pathogens, and should not be used as the sole basis for treatment or other patient management decisions. Negative results must be combined with clinical observations, patient history, and epidemiological information. The expected result is Negative. Fact Sheet for Patients: SugarRoll.be Fact Sheet for Healthcare Providers: https://www.woods-mathews.com/ This test is not yet approved or cleared by the [place] FDA and  has been authorized for detection and/or diagnosis of SARS-CoV-2 by FDA under an Emergency Use Authorization (EUA). This EUA will remain  in effect (meaning this test can be used) for the duration of the COVID-19 declaration under Section 56 4(b)(1) of the Act, 21 U.S.C. section 360bbb-3(b)(1), unless the authorization is terminated or revoked sooner. Performed at [facility], [place] [address] [place], [place] [address]          Radiology Studies: Mr Brain 31 Contrast  Result Date: 12/21/2018 CLINICAL DATA:  Encephalopathy EXAM: MRI HEAD WITHOUT CONTRAST TECHNIQUE: Multiplanar, multiecho pulse sequences of the brain and surrounding structures were obtained without intravenous contrast. COMPARISON:  Brain MRI 10/02/2016 Head CT 12/15/2018 FINDINGS: BRAIN: There is no acute infarct, acute hemorrhage or extra-axial collection. Early confluent hyperintense T2-weighted signal of the periventricular and deep white matter, most commonly due to chronic ischemic microangiopathy. There is generalized atrophy without lobar predilection. The midline structures are normal. VASCULAR: Susceptibility-sensitive  sequences show no chronic microhemorrhage or superficial siderosis. The major intracranial arterial and venous sinus flow voids are normal. SKULL AND UPPER CERVICAL SPINE: Calvarial bone marrow signal is normal. There is no skull base mass. The visualized upper cervical spine and soft tissues are normal.  SINUSES/ORBITS: There are no fluid levels or advanced mucosal thickening. The mastoid air cells and middle ear cavities are free of fluid. The orbits are normal. IMPRESSION: Atrophy and chronic ischemic microangiopathy without acute intracranial abnormality. Electronically Signed   By: Ulyses Jarred M.D.   On: 12/21/2018 21:03        Scheduled Meds:  aspirin EC  81 mg Oral Daily   clopidogrel  75 mg Oral Daily   feeding supplement  1 Container Oral TID BM   feeding supplement (PRO-STAT SUGAR FREE 64)  30 mL Oral BID WC   folic acid  1 mg Oral Daily   Gerhardt's butt cream   Topical TID   multivitamin with minerals  1 tablet Oral Daily   pantoprazole  20 mg Oral Daily   sodium bicarbonate  650 mg Oral BID   sodium chloride flush  3 mL Intravenous Q12H   sodium chloride flush  3 mL Intravenous Q12H   Continuous Infusions:  sodium chloride     dextrose 5 % and 0.9% NaCl 75 mL/hr at 12/22/18 0948     LOS: 2 days        Aline August, MD [facility] 12/22/2018, 1:53 PM

## 2018-12-22 NOTE — Progress Notes (Signed)
[name] informed this RN that the patient seemed to be much more sore on her bottom today and asked to be turned frequently.  I informed her that we would turn the patient every 2 hours to promote comfort and that the patient has PRN Tylenol if she needs it.

## 2018-12-22 NOTE — Progress Notes (Addendum)
Daily Rounding Note  12/22/2018, 12:45 PM  LOS: 2 days   SUBJECTIVE:   Chief complaint: elevated transaminases.  FTT Continues weak, anorexic.  No c/o pain.  No diarrhea  OBJECTIVE:         Vital signs in last 24 hours:    Temp:  [98.3 F (36.8 C)-99.8 F (37.7 C)] 98.3 F (36.8 C) (08/27 0358) Pulse Rate:  [72-81] 81 (08/27 0358) Resp:  [20-22] 20 (08/27 0358) BP: (102-114)/(54-68) 102/54 (08/27 0358) SpO2:  [99 %-100 %] 100 % (08/27 0358) Weight:  [49 kg] 49 kg (08/27 0618) Last BM Date: 12/21/18 Filed Weights   12/20/18 0956 12/21/18 0500 12/22/18 0618  Weight: 47.9 kg 47.7 kg 49 kg   General: frail, cachectic, unengaged   Heart: RRR Chest: clear bil.  No labored breathing Abdomen: soft, ND, thin.  Scant BS  Extremities: thin limbs, no edema Neuro/Psych:  Subdued, withdrawn affect.  Follows commands.    Intake/Output from previous day: 08/26 0701 - 08/27 0700 In: 207 [P.O.:120; I.V.:87] Out: 450 [Urine:450]  Intake/Output this shift: Total I/O In: 60 [P.O.:60] Out: -   Lab Results: Recent Labs    12/20/18 0554 12/21/18 0228 12/22/18 0301  WBC 1.9* 1.6* 1.9*  HGB 10.0* 10.0* 10.5*  HCT 29.6* 29.5* 31.3*  PLT 114* 111* 101*   BMET Recent Labs    12/20/18 0554 12/21/18 0228 12/22/18 0301  NA 132* 132* 134*  K 3.4* 3.2* 3.6  CL 109 112* 114*  CO2 17* 14* 15*  GLUCOSE 72 77 81  BUN '10 11 13  ' CREATININE 0.81 0.70 0.70  CALCIUM 7.8* 7.8* 8.2*   LFT Recent Labs    12/20/18 0554 12/21/18 0228 12/22/18 0301  PROT 4.7* 4.8* 4.9*  ALBUMIN 2.1* 1.9* 2.1*  AST 234* 229* 287*  ALT 119* 114* 136*  ALKPHOS 75 71 83  BILITOT 0.6 0.7 0.5    Studies/Results: Mr Brain Wo Contrast  Result Date: 12/21/2018 CLINICAL DATA:  Encephalopathy EXAM: MRI HEAD WITHOUT CONTRAST TECHNIQUE: Multiplanar, multiecho pulse sequences of the brain and surrounding structures were obtained without intravenous  contrast. COMPARISON:  Brain MRI 10/02/2016 Head CT 12/15/2018 FINDINGS: BRAIN: There is no acute infarct, acute hemorrhage or extra-axial collection. Early confluent hyperintense T2-weighted signal of the periventricular and deep white matter, most commonly due to chronic ischemic microangiopathy. There is generalized atrophy without lobar predilection. The midline structures are normal. VASCULAR: Susceptibility-sensitive sequences show no chronic microhemorrhage or superficial siderosis. The major intracranial arterial and venous sinus flow voids are normal. SKULL AND UPPER CERVICAL SPINE: Calvarial bone marrow signal is normal. There is no skull base mass. The visualized upper cervical spine and soft tissues are normal. SINUSES/ORBITS: There are no fluid levels or advanced mucosal thickening. The mastoid air cells and middle ear cavities are free of fluid. The orbits are normal. IMPRESSION: Atrophy and chronic ischemic microangiopathy without acute intracranial abnormality. Electronically Signed   By: Ulyses Jarred M.D.   On: 12/21/2018 21:03   Scheduled Meds: . aspirin EC  81 mg Oral Daily  . clopidogrel  75 mg Oral Daily  . feeding supplement  1 Container Oral TID BM  . feeding supplement (PRO-STAT SUGAR FREE 64)  30 mL Oral BID WC  . folic acid  1 mg Oral Daily  . Gerhardt's butt cream   Topical TID  . multivitamin with minerals  1 tablet Oral Daily  . pantoprazole  20 mg Oral Daily  .  sodium bicarbonate  650 mg Oral BID  . sodium chloride flush  3 mL Intravenous Q12H  . sodium chloride flush  3 mL Intravenous Q12H   Continuous Infusions: . sodium chloride    . dextrose 5 % and 0.9% NaCl 75 mL/hr at 12/22/18 0948   PRN Meds:.sodium chloride, acetaminophen **OR** acetaminophen, ondansetron **OR** ondansetron (ZOFRAN) IV, sodium chloride flush   ASSESMENT:   *   Elevated transaminases.  Persistent, continued incremental rise. Hepatitis ABC serologies negative CMV, EBV pending.  *    Progressive pancytopenia in setting of chronic leukopenia and anemia.  *     Weakness, FTT. Am cortisol is normal.  8.3 (rr: 6.7 - 22.6)  *    Chronic Plavix.   PLAN   *   Waiting on CMV and EBV tests.   obtain ANA, AMA, ASMA (r/o AIH).    *   Need for hematology and psych consult?    *   Added Megace 40 mg/day.    Sydney [name]  12/22/2018, 12:45 PM Phone [phone]    Attending Physician Note   I have taken an interval history, reviewed the chart and examined the patient. I agree with the Advanced Practitioner's note, impression and recommendations. Transaminases remain elevated and stable in the 100-200 range. Alk phos and t bili remain normal. Await CMV, EBV, ANA, AMA, ASMA. Weakness and FTT unchanged.   Lucio Edward, MD [facility]

## 2018-12-22 NOTE — Progress Notes (Signed)
Patient is still complaining of painful swallowing and refuses PO meds this morning and at this time.

## 2018-12-22 NOTE — Progress Notes (Signed)
Physical Therapy Treatment Patient Details Name: Sydney [name] MRN: [id number] DOB: 24-Sep-1930 Today's Date: 12/22/2018    History of Present Illness Sydney [name] is a 85 y.o. 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    PT Comments    Pt asleep on entry and very tired throughout session. Daughter reports pt had already worked with OT and was worn out. Pt able to come to EoB with max A for SLP to perform evaluation. After evaluation finished pt able to stand with minA and take lateral steps to HoB. PT provided mod A for returning to supine. PT will continue to follow acutely   Follow Up Recommendations  Home health PT;Supervision/Assistance - 24 hour     Equipment Recommendations  None recommended by PT    Recommendations for Other Services OT consult;Speech consult     Precautions / Restrictions Precautions Precautions: Fall Precaution Comments: had near syncopal episode on [facility] at home before coming in Restrictions Weight Bearing Restrictions: No    Mobility  Bed Mobility Overal bed mobility: Needs Assistance Bed Mobility: Supine to Sit;Sit to Supine     Supine to sit: Max assist Sit to supine: Mod assist   General bed mobility comments: Pt very sleepy on entrance and requires increase verbal and tactile cues for movement. Pt able to walk LE across bed surface and stops stating "I'm so tired." requires maxA to come to fully upright at [facility] for SLP eval, pt eager to get back in bed and only requires modA for getting LE back into bed.   Transfers Overall transfer level: Needs assistance Equipment used: Rolling walker (2 wheeled) Transfers: Sit to/from Stand Sit to Stand: Min assist          General transfer comment: min A for power up and steadying  Ambulation/Gait Ambulation/Gait assistance: Min assist Gait Distance (Feet): 2 Feet Assistive device: Rolling walker (2 wheeled) Gait Pattern/deviations: Narrow base of support;Decreased stride length;Shuffle;Step-to pattern Gait velocity: decreased Gait velocity interpretation: <1.31 ft/sec, indicative of household ambulator General Gait Details: minA for taking lateral steps along bed for optimal placement for return to bed    Modified Rankin (Stroke Patients Only) Modified Rankin (Stroke Patients Only) Pre-Morbid Rankin Score: No significant disability Modified Rankin: Moderately severe disability     Balance Overall balance assessment: Needs assistance Sitting-balance support: Single extremity supported Sitting balance-Leahy Scale: Fair     Standing balance support: No upper extremity supported Standing balance-Leahy Scale: Poor Standing balance comment: pt with increased reaction time and decreased ability to compenste for LOB                            Cognition Arousal/Alertness: Lethargic Behavior During Therapy: WFL for tasks assessed/performed Overall Cognitive Status: Impaired/Different from baseline Area of Impairment: Following commands;Problem solving                       Following Commands: Follows one step commands with increased time;Follows multi-step commands with increased time     Problem Solving: Slow processing;Decreased initiation;Requires verbal cues;Requires tactile cues General Comments: pt with decreased conversation despite short direct questions      Exercises      General Comments General comments (skin integrity, edema, etc.): Pt  daughter in room providing information, as pt slow with processing and limited in conversation today, Pt very tired today limiting participation      Pertinent Vitals/Pain Pain Assessment: No/denies pain    Home Living  Family/patient expects to be discharged to:: Private residence Living Arrangements: Children Available Help at Discharge: Family;Available 24 hours/day Type of Home: House Home Access: Stairs to enter Entrance Stairs-Rails: Right;Left Home Layout: One level Home Equipment: Grab bars - tub/shower Additional Comments: daughter is currently working from home but prior to covid worked out of town and pt was home 12+ hrs a day on her own    Prior Function Level of Independence: Needs assistance  Gait / Transfers Assistance Needed: prior to past month was independent with no AD and walked with daughter daily. Daughter reports is now shuffling with RW and supervision ADL's / Homemaking Assistance Needed: was independent but is no longer cooking because of fatigue and needs some assist with ADL's Comments: very independent with ADLs, IADLs and mobility   PT Goals (current goals can now be found in the care plan section) Acute Rehab PT Goals Patient Stated Goal: none stated but daughter wants to find out what is causing her mother's weakness/ fatigue PT Goal Formulation: With patient Time For Goal Achievement: 01/03/19 Potential to Achieve Goals: Good Progress towards PT goals: Not progressing toward goals - comment(increased fatigue today/worked with OT)    Frequency    Min 3X/week      PT Plan Current plan remains appropriate    Co-evaluation PT/OT/SLP Co-Evaluation/Treatment: Yes Reason for Co-Treatment: Other (comment)(to assist in positioning for optimal feeding ) PT goals addressed during session: Mobility/safety with mobility;Balance   SLP goals addressed during session: Swallowing    AM-PAC PT "6 Clicks" Mobility   Outcome Measure  Help needed turning from your back to your side while in a flat bed without using bedrails?: A Little Help needed moving from lying on your back to sitting on the side of a flat bed without using bedrails?: A Little Help needed moving to and  from a bed to a chair (including a wheelchair)?: A Little Help needed standing up from a chair using your arms (e.g., wheelchair or bedside chair)?: A Little Help needed to walk in hospital room?: A Little Help needed climbing 3-5 steps with a railing? : A Lot 6 Click Score: 17    End of Session Equipment Utilized During Treatment: Gait belt Activity Tolerance: Patient limited by fatigue Patient left: in bed;with call bell/phone within reach;with bed alarm set;with family/visitor present Nurse Communication: Mobility status PT Visit Diagnosis: Muscle weakness (generalized) (M62.81);Unsteadiness on feet (R26.81);Adult, failure to thrive (R62.7);Difficulty in walking, not elsewhere classified (R26.2)     Time: [id number] PT Time Calculation (min) (ACUTE ONLY): 11 min  Charges:  $Therapeutic Activity: 8-22 mins                     Pleshette Tomasini B. Migdalia Dk PT, DPT Acute Rehabilitation Services Pager [phone] Office [phone]    [place] 12/22/2018, 5:35 PM

## 2018-12-22 NOTE — Evaluation (Signed)
Clinical/Bedside Swallow Evaluation Patient Details  Name: Sydney [name] MRN: [id number] Date of Birth: 06/17/1930  Today's Date: 12/22/2018 Time: SLP Start Time (ACUTE ONLY): 14 SLP Stop Time (ACUTE ONLY): 1627 SLP Time Calculation (min) (ACUTE ONLY): 12 min  Past Medical History:  Past Medical History:  Diagnosis Date  . Anemia    years ago  . Arthritis    "left hand" (02/17/2013)  . C. difficile diarrhea   . Glaucoma   . High cholesterol    "on RX years ago" (02/17/2013)  . Hypertension   . Meningioma ([place]) 05/01/2015  . Osteoporosis 02/2014   T score -2.5  followed by Dr. Lysle Rubens  . Palpitations    PACs, PVCs and short runs of atrial tachycardia on Holter monitoring  . TIA (transient ischemic attack) 1990's   Past Surgical History:  Past Surgical History:  Procedure Laterality Date  . ANAL FISTULECTOMY  1970  . COLONOSCOPY    . COLONOSCOPY WITH PROPOFOL N/A 12/02/2016   Procedure: COLONOSCOPY WITH PROPOFOL;  Surgeon: Gatha Mayer, MD;  Location: [facility] ENDOSCOPY;  Service: Endoscopy;  Laterality: N/A;  . FECAL TRANSPLANT N/A 12/02/2016   Procedure: FECAL TRANSPLANT;  Surgeon: Gatha Mayer, MD;  Location: [facility] ENDOSCOPY;  Service: Endoscopy;  Laterality: N/A;  . 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  CT of the abdomen and pelvis was negative for acute intra-abdominal or pelvic abnormality.  She was found to have mildly elevated LFTs along with hyponatremia and pancytopenia.  Pt is currently on Dys 3 plus supplements.  She has had worsening appetite since 04-Nov-2022; recent death of her sister has led to concerns for depression. MRI brain negative.  Pt with protein cal malnutrition, FTT; prognosis guarded to poor per MD notes.  Palliative care consult is pending.     Assessment / Plan / Recommendation Clinical Impression  Pt participated in limited clinical swallow assessment given her reluctance to eat most of the food offered to her. PT present and assisted with positioning at EOB to optimize participation and LOA.  Pt quite hard-of-hearing, interfering with communication and inhibiting her ability to follow commands.  Oral mechanism exam unremarkable; tongue appears to be covered in thrush.  No focal deficits.  Pt consumed several sips of water from a straw with adequate oral attention and no s/s of aspiration.  She had 1/2 tspn of puree and declined all soft/regular solids offered (cake, cookie, cracker). Her daughter, at bedside, confirms increasingly poor appetite.  There are no overt s/s of aspiration nor concerns for an oropharyngeal dysphagia despite limited assessment. No hx of pna. Most recent CXR is clear.  Recommend continuing current dysphagia 3 diet with thin liquids. No acute care SLP needs are identified - our service will sign off.  SLP Visit Diagnosis: Dysphagia, unspecified (R13.10)    Aspiration Risk  No limitations    Diet Recommendation  continue dysphagia 3, thin liquids  Medication Administration: Whole meds with liquid    Other  Recommendations Oral Care Recommendations: Oral care BID   Follow up Recommendations None      Frequency and Duration            Prognosis        Swallow Study   General HPI: 85 year old female with history of recurrent C. difficile  colitis status post stool transplant, hypertension, TIA, recent admission with generalized weakness, dehydration and UTI and subsequent discharge on 12/18/2018 presented with generalized weakness and near syncope.  CT of the abdomen and pelvis was negative for acute intra-abdominal or pelvic abnormality.  She was found to have mildly elevated LFTs along with hyponatremia and pancytopenia.  Pt is currently on Dys 3 plus supplements.   She has had worsening appetite since 13-Nov-2022; recent death of her sister has led to concerns for depression. MRI brain negative.  Pt with protein cal malnutrition, FTT; prognosis guarded to poor per MD notes. Palliative care consult is pending.   Type of Study: Bedside Swallow Evaluation Previous Swallow Assessment: no Diet Prior to this Study: Dysphagia 3 (soft);Thin liquids Temperature Spikes Noted: No Respiratory Status: Room air History of Recent Intubation: No Behavior/Cognition: Alert;Other (Comment)(HOH) Oral Cavity Assessment: Other (comment)(difficult to assess) Oral Care Completed by SLP: No Vision: Functional for self-feeding Self-Feeding Abilities: Needs assist Patient Positioning: Other (comment)(upright at edge of bed) Baseline Vocal Quality: Normal Volitional Cough: Strong Volitional Swallow: Able to elicit    Oral/Motor/Sensory Function Overall Oral Motor/Sensory Function: Other (comment)(symmetry at rest)   Ice Chips Ice chips: Not tested   Thin Liquid Thin Liquid: Within functional limits Presentation: Straw    Nectar Thick Nectar Thick Liquid: Not tested   Honey Thick Honey Thick Liquid: Not tested   Puree Puree: Within functional limits   Solid     Solid: Not tested(pt declined cracker/cookie/cake)      [name] 12/22/2018,4:55 PM  [name] L. Tivis Ringer, [place] Office number [phone] Pager [phone]

## 2018-12-22 NOTE — Consult Note (Addendum)
[place]  [phone] [phone]    [place]  Referring MD: Dr. 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  Chart has been reviewed and the patient has had several emergency room visits as well as hospitalizations for generalized weakness, dehydration, and a urinary tract infection over the past month.  Work-up in the emergency room was significant for a sodium of 128, glucose 69, AST 268, ALT 135, white blood cell count 3.0, hemoglobin 11.1, platelet count 113,000, ANC 2.1.  The patient was admitted for evaluation of her generalized weakness.  Review of the patient's prior labs show that she has had intermittent leukopenia since at least 2014.  Her total white count ranges between 2-3.  She has also been only anemic since at least 2014 with a hemoglobin ranging between 10-12.  I did not see any history of low platelets on her lab work dating back to 2014.  Vitamin B12 level was normal at 435, folate normal at 27.8, ferritin elevated at 1758, absolute reticulocyte was not elevated.  The patient was resting in her room and did not participate in the history.  [name] is at the bedside and provides all the history.  [name] states that the patient has been having generalized weakness and fatigue since at least June 2020.  She states that this started shortly after the patient's sister died.  She reports that prior to this, her mother was very active and walked in the mall at least 2-3 times a week.  Now she cannot even cook a meal without having to sit down to rest before eating it.  The patient has complained of some discomfort in her left head which the daughter reports  as a sharp shooting pain.  She also reports anorexia and weight loss.  She has not had any dizziness.  Denies chest discomfort and shortness of breath at rest.  Denies cough.  Her daughter has noticed some dyspnea with exertion.  She reports that her abdomen feels full.  The patient had a couple episodes of vomiting prior to admission.  Not really reporting any nausea at this time.  She has not had any epistaxis, hemoptysis, hematemesis, hematuria, melena, hematochezia.  [name] states that she had a small wound on her sacrum.  This is being addressed by the wound care nurse.  The patient has not had any history of blood transfusions.  Hematology was asked see the patient to make recommendations regarding her pancytopenia.  Past Medical History:  Diagnosis Date   Anemia    years ago   Arthritis    "left hand" (02/17/2013)   C. difficile diarrhea    Glaucoma    High cholesterol    "on RX years ago" (02/17/2013)   Hypertension    Meningioma ([place]) 05/01/2015   Osteoporosis 02/2014   T score -2.5  followed by Dr. Lysle Rubens   Palpitations    PACs, PVCs and short runs of atrial tachycardia on Holter monitoring   TIA (transient ischemic attack) 1990's  :    Past Surgical History:  Procedure Laterality Date   ANAL FISTULECTOMY  1970   COLONOSCOPY     COLONOSCOPY WITH PROPOFOL N/A 12/02/2016   Procedure: COLONOSCOPY WITH PROPOFOL;  Surgeon: Gatha Mayer, MD;  Location:  [facility];  Service: Endoscopy;  Laterality: N/A;   FECAL TRANSPLANT N/A 12/02/2016   Procedure: FECAL TRANSPLANT;  Surgeon: Gatha Mayer, MD;  Location: [facility] ENDOSCOPY;  Service: Endoscopy;  Laterality: N/A;   TONSILLECTOMY    :   CURRENT MEDS: Current Facility-Administered Medications  Medication Dose Route Frequency Provider Last Rate Last Dose   0.9 %  sodium chloride infusion  250 mL Intravenous PRN Opyd, Ilene Qua, MD       acetaminophen (TYLENOL) tablet 650 mg  650 mg Oral Q6H PRN  Opyd, Ilene Qua, MD       Or   acetaminophen (TYLENOL) suppository 650 mg  650 mg Rectal Q6H PRN Opyd, Ilene Qua, MD       aspirin EC tablet 81 mg  81 mg Oral Daily Opyd, Ilene Qua, MD   81 mg at 12/21/18 1112   clopidogrel (PLAVIX) tablet 75 mg  75 mg Oral Daily Opyd, Ilene Qua, MD   75 mg at 12/21/18 1112   dextrose 5 %-0.9 % sodium chloride infusion   Intravenous Continuous Aline August, MD 75 mL/hr at 12/22/18 0948     feeding supplement (BOOST / RESOURCE BREEZE) liquid 1 Container  1 Container Oral TID BM Aline August, MD   1 Container at 12/22/18 1321   feeding supplement (PRO-STAT SUGAR FREE 64) liquid 30 mL  30 mL Oral BID WC Darliss Cheney, MD   30 mL at 03/88/82 8003   folic acid (FOLVITE) tablet 1 mg  1 mg Oral Daily Opyd, Ilene Qua, MD   1 mg at 12/21/18 1112   Gerhardt's butt cream   Topical TID Darliss Cheney, MD       multivitamin with minerals tablet 1 tablet  1 tablet Oral Daily Darliss Cheney, MD   1 tablet at 12/21/18 1113   ondansetron (ZOFRAN) tablet 4 mg  4 mg Oral Q6H PRN Opyd, Ilene Qua, MD       Or   ondansetron (ZOFRAN) injection 4 mg  4 mg Intravenous Q6H PRN Opyd, Ilene Qua, MD       pantoprazole (PROTONIX) EC tablet 20 mg  20 mg Oral Daily Opyd, Ilene Qua, MD   20 mg at 12/21/18 1113   sodium bicarbonate tablet 650 mg  650 mg Oral BID Alekh, Kshitiz, MD       sodium chloride flush (NS) 0.9 % injection 3 mL  3 mL Intravenous Q12H Opyd, Ilene Qua, MD   3 mL at 12/22/18 0950   sodium chloride flush (NS) 0.9 % injection 3 mL  3 mL Intravenous Q12H Opyd, Ilene Qua, MD   3 mL at 12/22/18 0950   sodium chloride flush (NS) 0.9 % injection 3 mL  3 mL Intravenous PRN Opyd, Ilene Qua, MD          Allergies  Allergen Reactions   Demerol [Meperidine] Other (See Comments)    Excessive sweating, cramping   Other     No Antibiotic without consultation with her primary physician.   Codeine Rash   Sulfa Antibiotics Itching and Other (See Comments)     Reaction unknown   Tomato Itching  :  Family History  Problem Relation Age of Onset   Hypertension Father    Heart attack Father    Stroke Brother    Stroke Maternal Grandmother   :  Social History   Socioeconomic History   Marital status: Widowed    Spouse name: Not on file   Number of children: 1  Years of education: 70   Highest education level: Not on file  Occupational History   Occupation: retired  Scientist, product/process development strain: Not on file   Food insecurity    Worry: Not on file    Inability: Not on Lexicographer needs    Medical: Not on file    Non-medical: Not on file  Tobacco Use   Smoking status: Never Smoker   Smokeless tobacco: Never Used  Substance and Sexual Activity   Alcohol use: No   Drug use: No   Sexual activity: Never    Comment: 1st intercourse 85 yo-Fewer than 5 partners  Lifestyle   Physical activity    Days per week: Not on file    Minutes per session: Not on file   Stress: Not on file  Relationships   Social connections    Talks on phone: Not on file    Gets together: Not on file    Attends religious service: Not on file    Active member of club or organization: Not on file    Attends meetings of clubs or organizations: Not on file    Relationship status: Not on file   Intimate partner violence    Fear of current or ex partner: Not on file    Emotionally abused: Not on file    Physically abused: Not on file    Forced sexual activity: Not on file  Other Topics Concern   Not on file  Social History Narrative   Patient does not drink caffeine.   Patient is right handed.   :  REVIEW OF SYSTEMS: A comprehensive 14 point review of systems was negative except as noted in the HPI.  Exam: Patient Vitals for the past 24 hrs:  BP Temp Temp src Pulse Resp SpO2 Weight  12/22/18 0618 -- -- -- -- -- -- 108 lb 0.4 oz (49 kg)  12/22/18 0358 (!) 102/54 98.3 F (36.8 C) Oral 81 20 100 % --   12/21/18 2107 114/68 98.4 F (36.9 C) Oral 78 (!) 22 99 % --  12/21/18 1616 (!) 104/54 99.8 F (37.7 C) Oral 72 -- 100 % --    General: Frail, elderly female who is resting quietly.  No distress. Eyes:  no scleral icterus.   ENT:  There were no oropharyngeal lesions.   Neck was without thyromegaly.   Lymphatics:  Negative cervical, supraclavicular or axillary adenopathy.   Respiratory: lungs were clear bilaterally without wheezing or crackles.   Cardiovascular:  Regular rate and rhythm, S1/S2, without murmur, rub or gallop.  There was no pedal edema.   GI:  abdomen was soft, flat, nontender, nondistended, without organomegaly.  Musculoskeletal:  no spinal tenderness of palpation of vertebral spine.   Skin exam was without ecchymosis, petechiae.   Neuro exam was nonfocal.  Patient was withdrawn and not engaged in our conversation today.  LABS:  Lab Results  Component Value Date   WBC 1.9 (L) 12/22/2018   HGB 10.5 (L) 12/22/2018   HCT 31.3 (L) 12/22/2018   PLT 101 (L) 12/22/2018   GLUCOSE 81 12/22/2018   CHOL 136 10/03/2017   TRIG 38 10/03/2017   HDL 49 10/03/2017   LDLCALC 79 10/03/2017   ALT 136 (H) 12/22/2018   AST 287 (H) 12/22/2018   NA 134 (L) 12/22/2018   K 3.6 12/22/2018   CL 114 (H) 12/22/2018   CREATININE 0.70 12/22/2018   BUN 13 12/22/2018   CO2 15 (L)  12/22/2018   INR 1.1 12/16/2018   HGBA1C 5.8 (H) 10/03/2017    Ct Head Wo Contrast  Result Date: 12/15/2018 CLINICAL DATA:  Intermittent weakness for the past 2 days. Known left-sided meningioma. EXAM: CT HEAD WITHOUT CONTRAST TECHNIQUE: Contiguous axial images were obtained from the base of the skull through the vertex without intravenous contrast. COMPARISON:  Head CT dated 10/02/2017 and brain MR dated 10/02/2017. FINDINGS: Brain: Mild-to-moderate patchy white matter low density in both cerebral hemispheres without significant change. Stable small left parafalcine meningioma. No intracranial hemorrhage, new  mass lesion or CT evidence of acute infarction. Vascular: No hyperdense vessel or unexpected calcification. Skull: Normal. Negative for fracture or focal lesion. Sinuses/Orbits: Unremarkable. Other: None. IMPRESSION: 1. No acute abnormality. 2. Stable mild-to-moderate atrophy and chronic small vessel white matter ischemic changes in both cerebral hemispheres. 3. Stable small left parafalcine meningioma. Electronically Signed   By: Claudie Revering M.D.   On: 12/15/2018 14:16   Mr Brain Wo Contrast  Result Date: 12/21/2018 CLINICAL DATA:  Encephalopathy EXAM: MRI HEAD WITHOUT CONTRAST TECHNIQUE: Multiplanar, multiecho pulse sequences of the brain and surrounding structures were obtained without intravenous contrast. COMPARISON:  Brain MRI 10/02/2016 Head CT 12/15/2018 FINDINGS: BRAIN: There is no acute infarct, acute hemorrhage or extra-axial collection. Early confluent hyperintense T2-weighted signal of the periventricular and deep white matter, most commonly due to chronic ischemic microangiopathy. There is generalized atrophy without lobar predilection. The midline structures are normal. VASCULAR: Susceptibility-sensitive sequences show no chronic microhemorrhage or superficial siderosis. The major intracranial arterial and venous sinus flow voids are normal. SKULL AND UPPER CERVICAL SPINE: Calvarial bone marrow signal is normal. There is no skull base mass. The visualized upper cervical spine and soft tissues are normal. SINUSES/ORBITS: There are no fluid levels or advanced mucosal thickening. The mastoid air cells and middle ear cavities are free of fluid. The orbits are normal. IMPRESSION: Atrophy and chronic ischemic microangiopathy without acute intracranial abnormality. Electronically Signed   By: Ulyses Jarred M.D.   On: 12/21/2018 21:03   Ct Abdomen Pelvis W Contrast  Result Date: 12/20/2018 CLINICAL DATA:  Abnormal LFTs, generalized weakness EXAM: CT ABDOMEN AND PELVIS WITH CONTRAST TECHNIQUE:  Multidetector CT imaging of the abdomen and pelvis was performed using the standard protocol following bolus administration of intravenous contrast. CONTRAST:  139m OMNIPAQUE IOHEXOL 300 MG/ML  SOLN COMPARISON:  CT 10/17/2016 FINDINGS: Lower chest: Lung bases demonstrate small bilateral pleural effusions and passive atelectasis within the posterior lower lobes. The heart appears slightly enlarged. Hepatobiliary: Central hepatic cyst. No calcified gallstone or biliary dilatation Pancreas: Unremarkable. No pancreatic ductal dilatation or surrounding inflammatory changes. Spleen: Normal in size without focal abnormality. Adrenals/Urinary Tract: Adrenal glands are unremarkable. Kidneys are normal, without renal calculi, focal lesion, or hydronephrosis. Bladder is unremarkable. Stomach/Bowel: Stomach is nonenlarged. No dilated small bowel. Fluid-filled colon without bowel wall thickening. Appendix not clearly identified but no right lower quadrant inflammatory process. Vascular/Lymphatic: Nonaneurysmal aorta. Scattered aortic atherosclerosis. No significantly enlarged lymph nodes Reproductive: Calcified uterine fibroids.  No adnexal mass Other: No free air. Probable small amount of free fluid in the pelvis Musculoskeletal: Trace anterolisthesis L3 on L4. Degenerative changes at L3-L4 and L4-L5 IMPRESSION: 1. No CT evident for acute intra-abdominal or pelvic abnormality. Diffuse fluid within the colon suggesting possible enteritis or diarrheal illness 2. Small bilateral pleural effusions with passive atelectasis at the bases 3. Uterine fibroids Electronically Signed   By: KDonavan FoilM.D.   On: 12/20/2018 02:28   Dg Chest Portable  1 View  Result Date: 12/16/2018 CLINICAL DATA:  Shortness of breath EXAM: PORTABLE CHEST 1 VIEW COMPARISON:  12/13/2018 FINDINGS: Cardiac shadow is stable. Aortic calcifications are again seen. The lungs are well aerated bilaterally. No focal infiltrate or sizable effusion is seen. Skin  folds are noted over the left chest. No bony abnormality is seen. IMPRESSION: No acute abnormality noted. Electronically Signed   By: Inez Catalina M.D.   On: 12/16/2018 12:09   Dg Chest Portable 1 View  Result Date: 12/13/2018 CLINICAL DATA:  Weakness and cough EXAM: PORTABLE CHEST 1 VIEW COMPARISON:  10/02/2017 FINDINGS: Cardiac shadow is within normal limits. Aortic calcifications are again seen. The lungs are well aerated bilaterally. No focal infiltrate or sizable effusion is seen. IMPRESSION: No acute abnormality noted. Electronically Signed   By: Inez Catalina M.D.   On: 12/13/2018 14:37   Dg Abdomen Acute W/chest  Result Date: 12/20/2018 CLINICAL DATA:  85 year old female with generalized weakness. EXAM: DG ABDOMEN ACUTE W/ 1V CHEST COMPARISON:  CT of the abdomen pelvis dated 10/18/2016 FINDINGS: Probable background of emphysema. Trace right pleural effusion with associated atelectasis. No focal consolidation or pneumothorax. Mild cardiomegaly. Atherosclerotic calcification of the aorta. There is no bowel dilatation or evidence of obstruction. No free air. Probable calcified fibroid in the pelvis. Degenerative changes of the spine and hips. The soft tissues are unremarkable. IMPRESSION: 1. Trace right pleural effusion with associated atelectasis. 2. No bowel obstruction. Electronically Signed   By: Anner Crete M.D.   On: 12/20/2018 00:05   Dg Hip Unilat W Or Wo Pelvis 1 View Right  Result Date: 12/16/2018 CLINICAL DATA:  Fall,,confused ,,,,rt hip tenderness EXAM: DG HIP (WITH OR WITHOUT PELVIS) 1V RIGHT COMPARISON:  None. FINDINGS: No acute fracture or subluxation there is atherosclerotic calcification of the femoral arteries bilaterally. Bowel gas pattern is nonobstructive. IMPRESSION: No evidence for acute abnormality. Electronically Signed   By: Nolon Nations M.D.   On: 12/16/2018 12:58    ASSESSMENT AND PLAN:  1.  Pancytopenia 2.  Elevated LFTs 3.  Weakness and failure to  thrive 4.  Hyponatremia, improving  -Labs from this admission and prior lab work dating back to 2014 has been reviewed.  The patient has had intermittent leukopenia and anemia dating back to at least 2014 which is overall stable.  She has now developed mild thrombocytopenia.  Possible causes include poor nutrition versus underlying infection versus MDS versus medication effect.  Given that her pancytopenia is mild at this time, recommend supportive care.  She does not require any transfusion at this time. -Recommend transfusion of packed red blood cells for hemoglobin less than 7.0 and transfusion of platelets if less than 10,000. -We will plan to see the patient back as an outpatient approximately 2 weeks after discharge to recheck her counts.  If they are not improving, recommend proceeding with a bone marrow biopsy for further evaluation.. I have reviewed this with the patient's daughter who is in agreement. -Continue ongoing management of elevated LFTs per hospitalist and GI. -Megace has been added due to anorexia.  Thank you for this referral.  Mikey Bussing, DNP, AGPCNP-BC, AOCNP  Attending Note  I personally saw the patient, reviewed the chart and examined the patient. The plan of care was discussed with the patient and the patient's daughter. I agree with the assessment and plan as documented above. Thank you very much for the consultation.  1.  Pancytopenia: Patient has mild anemia and mild thrombocytopenia. The leukopenia appears to  be more recent. The differential diagnosis is fairly broad which includes medications, infections, autoimmune causes, bone marrow disorders.  I explained to the patient's daughter that the bone marrow cellularity decreases with age and at age 22 she would only have 14% cellularity.  Therefore any major illness could cause severe bone marrow suppression and pancytopenia. Upon reviewing her labs it appears that B12 was borderline at 215 during an earlier  hospitalization.  Although it has improved temporarily, I suspect that the patient might have underlying B12 deficiency.  Because of that I plan to give her B12 injection 1000 mcg daily for 3 days. I explained to the daughter that it may or may not improve her strength and energy however it is worth an attempt.  2.  Severe failure to thrive: Unclear for the reason for this.  Based on the albumin of 2.9 he can clearly say that she has severe protein malnutrition.  I also discussed with her that if the failure to thrive does not improve then her physical condition will continue to deteriorate.  Patient's daughter was telling me that prior to all of this she is an extremely active lady who loves to cook and stay busy.  My plan is to watch and monitor and follow her as an outpatient to recheck on her labs.  I do not intend to do bone marrow biopsy at this time.

## 2018-12-22 NOTE — Progress Notes (Signed)
Patient ID: Sydney [name], female   DOB: [birth year], 85 y.o.   MRN: [id number]         [facility]  Date of Admission:  12/19/2018    \  ASSESSMENT: The cause of her generalized weakness remains unclear.  I do not see any evidence of symptomatic UTI no recurrence of her C. difficile colitis or other bacterial infection.  She has chronic leukopenia and anemia and now has worsening pancytopenia and elevated liver transaminases.  Hepatitis A, B and C serologies are negative.  EBV and CMV serologies are pending.  Given her worsening pancytopenia I would also consider hematologic disorders.  PLAN: 1. Await EBV and CMV serologies 2. Will call and discuss this with her daughter, [name]  Principal Problem:   Generalized weakness Active Problems:   Pancytopenia ([place])   Transaminitis   Hyponatremia   Protein calorie malnutrition ([place])   History of TIA (transient ischemic attack)   Pressure injury of skin   Malnutrition of moderate degree   Scheduled Meds: . aspirin EC  81 mg Oral Daily  . clopidogrel  75 mg Oral Daily  . feeding supplement  1 Container Oral TID BM  . feeding supplement (PRO-STAT SUGAR FREE 64)  30 mL Oral BID WC  . folic acid  1 mg Oral Daily  . Gerhardt's butt cream   Topical TID  . multivitamin with minerals  1 tablet Oral Daily  . pantoprazole  20 mg Oral Daily  . sodium bicarbonate  650 mg Oral BID  . sodium chloride flush  3 mL Intravenous Q12H  . sodium chloride flush  3 mL Intravenous Q12H   Continuous Infusions: . sodium chloride    . dextrose 5 % and 0.9% NaCl     PRN Meds:.sodium chloride, acetaminophen **OR** acetaminophen, ondansetron **OR** ondansetron (ZOFRAN) IV, sodium chloride flush   SUBJECTIVE: She has not had any diarrhea, nausea, vomiting or abdominal pain.  She ate virtually none of her breakfast.  Review of Systems: Review of Systems  Constitutional: Positive for malaise/fatigue and weight loss. Negative  for chills and fever.  Gastrointestinal: Negative for abdominal pain, diarrhea, nausea and vomiting.    Allergies  Allergen Reactions  . Demerol [Meperidine] Other (See Comments)    Excessive sweating, cramping  . Other     No Antibiotic without consultation with her primary physician.  . Codeine Rash  . Sulfa Antibiotics Itching and Other (See Comments)    Reaction unknown  . Tomato Itching    OBJECTIVE: Vitals:   12/21/18 1616 12/21/18 2107 12/22/18 0358 12/22/18 0618  BP: (!) 104/54 114/68 (!) 102/54   Pulse: 72 78 81   Resp:  (!) 22 20   Temp: 99.8 F (37.7 C) 98.4 F (36.9 C) 98.3 F (36.8 C)   TempSrc: Oral Oral Oral   SpO2: 100% 99% 100%   Weight:    49 kg  Height:       Body mass index is 19.14 kg/m.  Physical Exam Constitutional:      Comments: She remains very weak and frail.   Cardiovascular:     Rate and Rhythm: Normal rate and regular rhythm.     Heart sounds: No murmur.  Pulmonary:     Effort: Pulmonary effort is normal.     Breath sounds: Normal breath sounds.  Abdominal:     Palpations: Abdomen is soft.     Tenderness: There is no abdominal tenderness.     Lab Results Lab  Results  Component Value Date   WBC 1.9 (L) 12/22/2018   HGB 10.5 (L) 12/22/2018   HCT 31.3 (L) 12/22/2018   MCV 92.6 12/22/2018   PLT 101 (L) 12/22/2018    Lab Results  Component Value Date   CREATININE 0.70 12/22/2018   BUN 13 12/22/2018   NA 134 (L) 12/22/2018   K 3.6 12/22/2018   CL 114 (H) 12/22/2018   CO2 15 (L) 12/22/2018    Lab Results  Component Value Date   ALT 136 (H) 12/22/2018   AST 287 (H) 12/22/2018   ALKPHOS 83 12/22/2018   BILITOT 0.5 12/22/2018     Microbiology: Recent Results (from the past 240 hour(s))  Urine culture     Status: Abnormal   Collection Time: 12/15/18  8:11 AM   Specimen: Urine, Random  Result Value Ref Range Status   Specimen Description URINE, RANDOM  Final   Special Requests NONE  Final   Culture (A)  Final     <10,000 COLONIES/mL INSIGNIFICANT GROWTH Performed at [facility], [address], [place], [place] [address]    Report Status 12/16/2018 FINAL  Final  SARS Coronavirus 2 [facility] order, Performed in [facility] hospital lab) Nasopharyngeal Nasopharyngeal Swab     Status: None   Collection Time: 12/15/18  8:11 AM   Specimen: Nasopharyngeal Swab  Result Value Ref Range Status   SARS Coronavirus 2 NEGATIVE NEGATIVE Final    Comment: (NOTE) If result is NEGATIVE SARS-CoV-2 target nucleic acids are NOT DETECTED. The SARS-CoV-2 RNA is generally detectable in upper and lower  respiratory specimens during the acute phase of infection. The lowest  concentration of SARS-CoV-2 viral copies this assay can detect is 250  copies / mL. A negative result does not preclude SARS-CoV-2 infection  and should not be used as the sole basis for treatment or other  patient management decisions.  A negative result may occur with  improper specimen collection / handling, submission of specimen other  than nasopharyngeal swab, presence of viral mutation(s) within the  areas targeted by this assay, and inadequate number of viral copies  (<250 copies / mL). A negative result must be combined with clinical  observations, patient history, and epidemiological information. If result is POSITIVE SARS-CoV-2 target nucleic acids are DETECTED. The SARS-CoV-2 RNA is generally detectable in upper and lower  respiratory specimens dur ing the acute phase of infection.  Positive  results are indicative of active infection with SARS-CoV-2.  Clinical  correlation with patient history and other diagnostic information is  necessary to determine patient infection status.  Positive results do  not rule out bacterial infection or co-infection with other viruses. If result is PRESUMPTIVE POSTIVE SARS-CoV-2 nucleic acids MAY BE PRESENT.   A presumptive positive result was obtained on the submitted specimen  and confirmed  on repeat testing.  While 2019 novel coronavirus  (SARS-CoV-2) nucleic acids may be present in the submitted sample  additional confirmatory testing may be necessary for epidemiological  and / or clinical management purposes  to differentiate between  SARS-CoV-2 and other Sarbecovirus currently known to infect humans.  If clinically indicated additional testing with an alternate test  methodology [phone]) is advised. The SARS-CoV-2 RNA is generally  detectable in upper and lower respiratory sp ecimens during the acute  phase of infection. The expected result is Negative. Fact Sheet for Patients:  StrictlyIdeas.no Fact Sheet for Healthcare Providers: BankingDealers.co.za This test is not yet approved or cleared by the [place]  FDA and has been authorized for detection and/or diagnosis of SARS-CoV-2 by FDA under an Emergency Use Authorization (EUA).  This EUA will remain in effect (meaning this test can be used) for the duration of the COVID-19 declaration under Section 564(b)(1) of the Act, 21 U.S.C. section 360bbb-3(b)(1), unless the authorization is terminated or revoked sooner. Performed at [facility], [place] [address], [place], [place] [address]   Urine Culture     Status: Abnormal   Collection Time: 12/16/18  1:32 PM   Specimen: Urine, Random  Result Value Ref Range Status   Specimen Description URINE, RANDOM  Final   Special Requests   Final    NONE Performed at [facility], [place] [address], [place], [place] [address]    Culture >=100,000 COLONIES/mL KLEBSIELLA PNEUMONIAE (A)  Final   Report Status 12/20/2018 FINAL  Final   Organism ID, Bacteria KLEBSIELLA PNEUMONIAE (A)  Final      Susceptibility   Klebsiella pneumoniae - MIC*    AMPICILLIN RESISTANT Resistant     CEFAZOLIN <=4 SENSITIVE Sensitive     CEFTRIAXONE <=1 SENSITIVE Sensitive     CIPROFLOXACIN <=0.25 SENSITIVE Sensitive     GENTAMICIN <=1  SENSITIVE Sensitive     IMIPENEM <=0.25 SENSITIVE Sensitive     NITROFURANTOIN <=16 SENSITIVE Sensitive     TRIMETH/SULFA <=20 SENSITIVE Sensitive     AMPICILLIN/SULBACTAM 4 SENSITIVE Sensitive     PIP/TAZO <=4 SENSITIVE Sensitive     Extended ESBL NEGATIVE Sensitive     * >=100,000 COLONIES/mL KLEBSIELLA PNEUMONIAE  Urine Culture     Status: Abnormal   Collection Time: 12/19/18  4:12 AM   Specimen: Urine, Clean Catch  Result Value Ref Range Status   Specimen Description URINE, CLEAN CATCH  Final   Special Requests NONE  Final   Culture (A)  Final    <10,000 COLONIES/mL INSIGNIFICANT GROWTH Performed at [facility], [place] [address], [place], [place] [address]    Report Status 12/21/2018 FINAL  Final  Blood culture (routine x 2)     Status: None (Preliminary result)   Collection Time: 12/20/18 12:43 AM   Specimen: BLOOD  Result Value Ref Range Status   Specimen Description BLOOD RIGHT ANTECUBITAL  Final   Special Requests   Final    BOTTLES DRAWN AEROBIC AND ANAEROBIC Blood Culture adequate volume   Culture   Final    NO GROWTH 1 DAY Performed at [facility], [place] [address], [place], [place] [address]    Report Status PENDING  Incomplete  SARS CORONAVIRUS 2 (TAT 6-12 HRS) Nasal Swab Aptima Multi Swab     Status: None   Collection Time: 12/20/18  3:52 AM   Specimen: Aptima Multi Swab; Nasal Swab  Result Value Ref Range Status   SARS Coronavirus 2 NEGATIVE NEGATIVE Final    Comment: (NOTE) SARS-CoV-2 target nucleic acids are NOT DETECTED. The SARS-CoV-2 RNA is generally detectable in upper and lower respiratory specimens during the acute phase of infection. Negative results do not preclude SARS-CoV-2 infection, do not rule out co-infections with other pathogens, and should not be used as the sole basis for treatment or other patient management decisions. Negative results must be combined with clinical observations, patient history, and epidemiological  information. The expected result is Negative. Fact Sheet for Patients: SugarRoll.be Fact Sheet for Healthcare Providers: https://www.woods-mathews.com/ This test is not yet approved or cleared by the [place] FDA and  has been authorized for detection  and/or diagnosis of SARS-CoV-2 by FDA under an Emergency Use Authorization (EUA). This EUA will remain  in effect (meaning this test can be used) for the duration of the COVID-19 declaration under Section 56 4(b)(1) of the Act, 21 U.S.C. section 360bbb-3(b)(1), unless the authorization is terminated or revoked sooner. Performed at [facility], [place] [address] [place], [place] [address]     Michel Bickers, [place] [facility] [place] [facility] [phone] pager   [phone] cell 12/22/2018, 9:36 AM

## 2018-12-23 ENCOUNTER — Inpatient Hospital Stay (HOSPITAL_COMMUNITY): Payer: Medicare Other

## 2018-12-23 DIAGNOSIS — Z1339 Encounter for screening examination for other mental health and behavioral disorders: Secondary | ICD-10-CM | POA: Diagnosis not present

## 2018-12-23 DIAGNOSIS — R634 Abnormal weight loss: Secondary | ICD-10-CM | POA: Diagnosis not present

## 2018-12-23 DIAGNOSIS — R5383 Other fatigue: Secondary | ICD-10-CM | POA: Diagnosis not present

## 2018-12-23 DIAGNOSIS — Z008 Encounter for other general examination: Secondary | ICD-10-CM

## 2018-12-23 DIAGNOSIS — F329 Major depressive disorder, single episode, unspecified: Secondary | ICD-10-CM | POA: Diagnosis not present

## 2018-12-23 DIAGNOSIS — Z681 Body mass index (BMI) 19 or less, adult: Secondary | ICD-10-CM | POA: Diagnosis not present

## 2018-12-23 DIAGNOSIS — Z0489 Encounter for examination and observation for other specified reasons: Secondary | ICD-10-CM

## 2018-12-23 DIAGNOSIS — E860 Dehydration: Secondary | ICD-10-CM | POA: Diagnosis not present

## 2018-12-23 DIAGNOSIS — R5381 Other malaise: Secondary | ICD-10-CM

## 2018-12-23 DIAGNOSIS — E440 Moderate protein-calorie malnutrition: Secondary | ICD-10-CM | POA: Diagnosis not present

## 2018-12-23 DIAGNOSIS — R531 Weakness: Secondary | ICD-10-CM | POA: Diagnosis not present

## 2018-12-23 DIAGNOSIS — R945 Abnormal results of liver function studies: Secondary | ICD-10-CM | POA: Diagnosis not present

## 2018-12-23 LAB — CBC WITH DIFFERENTIAL/PLATELET
Abs Immature Granulocytes: 0.02 10*3/uL (ref 0.00–0.07)
Basophils Absolute: 0 10*3/uL (ref 0.0–0.1)
Basophils Relative: 0 %
Eosinophils Absolute: 0.1 10*3/uL (ref 0.0–0.5)
Eosinophils Relative: 4 %
HCT: 30.5 % — ABNORMAL LOW (ref 36.0–46.0)
Hemoglobin: 10.5 g/dL — ABNORMAL LOW (ref 12.0–15.0)
Immature Granulocytes: 1 %
Lymphocytes Relative: 19 %
Lymphs Abs: 0.3 10*3/uL — ABNORMAL LOW (ref 0.7–4.0)
MCH: 31.3 pg (ref 26.0–34.0)
MCHC: 34.4 g/dL (ref 30.0–36.0)
MCV: 90.8 fL (ref 80.0–100.0)
Monocytes Absolute: 0.1 10*3/uL (ref 0.1–1.0)
Monocytes Relative: 7 %
Neutro Abs: 1.2 10*3/uL — ABNORMAL LOW (ref 1.7–7.7)
Neutrophils Relative %: 69 %
Platelets: 101 10*3/uL — ABNORMAL LOW (ref 150–400)
RBC: 3.36 MIL/uL — ABNORMAL LOW (ref 3.87–5.11)
RDW: 14.6 % (ref 11.5–15.5)
WBC: 1.8 10*3/uL — ABNORMAL LOW (ref 4.0–10.5)
nRBC: 0 % (ref 0.0–0.2)

## 2018-12-23 LAB — COMPREHENSIVE METABOLIC PANEL
ALT: 141 U/L — ABNORMAL HIGH (ref 0–44)
AST: 311 U/L — ABNORMAL HIGH (ref 15–41)
Albumin: 1.9 g/dL — ABNORMAL LOW (ref 3.5–5.0)
Alkaline Phosphatase: 86 U/L (ref 38–126)
Anion gap: 4 — ABNORMAL LOW (ref 5–15)
BUN: 9 mg/dL (ref 8–23)
CO2: 16 mmol/L — ABNORMAL LOW (ref 22–32)
Calcium: 7.9 mg/dL — ABNORMAL LOW (ref 8.9–10.3)
Chloride: 114 mmol/L — ABNORMAL HIGH (ref 98–111)
Creatinine, Ser: 0.67 mg/dL (ref 0.44–1.00)
GFR calc Af Amer: >60 mL/min (ref >60)
GFR calc non Af Amer: >60 mL/min (ref >60)
Glucose, Bld: 98 mg/dL (ref 70–99)
Potassium: 3.4 mmol/L — ABNORMAL LOW (ref 3.5–5.1)
Sodium: 134 mmol/L — ABNORMAL LOW (ref 135–145)
Total Bilirubin: 0.5 mg/dL (ref 0.3–1.2)
Total Protein: 4.8 g/dL — ABNORMAL LOW (ref 6.5–8.1)

## 2018-12-23 LAB — MAGNESIUM: Magnesium: 1.8 mg/dL (ref 1.7–2.4)

## 2018-12-23 LAB — GLUCOSE, CAPILLARY: Glucose-Capillary: 93 mg/dL (ref 70–99)

## 2018-12-23 MED ORDER — POTASSIUM CHLORIDE CRYS ER 20 MEQ PO TBCR
40.0000 meq | EXTENDED_RELEASE_TABLET | Freq: Once | ORAL | Status: AC
  Administered 2018-12-23: 40 meq via ORAL
  Filled 2018-12-23: qty 2

## 2018-12-23 MED ORDER — SODIUM BICARBONATE-DEXTROSE 150-5 MEQ/L-% IV SOLN
150.0000 meq | INTRAVENOUS | Status: DC
  Administered 2018-12-23 – 2018-12-25 (×4): 150 meq via INTRAVENOUS
  Filled 2018-12-23 (×4): qty 1000

## 2018-12-23 MED ORDER — MAGIC MOUTHWASH
5.0000 mL | Freq: Four times a day (QID) | ORAL | Status: DC | PRN
  Administered 2018-12-25 – 2019-01-14 (×6): 5 mL via ORAL
  Filled 2018-12-23 (×9): qty 5

## 2018-12-23 NOTE — Progress Notes (Signed)
Pt has stage 2 sacral wound changed the bed to air mattress.

## 2018-12-23 NOTE — Progress Notes (Signed)
Pt back from abdominal ultrasound resume diet, daughter at the bedside.

## 2018-12-23 NOTE — Consult Note (Signed)
Telepsych Consultation   Reason for Consult:  "Depression: not eating well after recent death in family. GI and Daughter requesting Psychiatry eval" Referring Physician:  Dr. Aline August Location of Patient: [facility] Location of Provider: [facility]  Patient Identification: MEOSHA KOVACK MRN:  [id number] Principal Diagnosis: Evaluation by psychiatric service required Diagnosis:  Principal Problem:   Generalized weakness Active Problems:   Pancytopenia ([place])   Hyponatremia   Protein calorie malnutrition ([place])   History of TIA (transient ischemic attack)   Pressure injury of skin   Transaminitis   Malnutrition of moderate degree   Total Time spent with patient: 1 hour  Subjective:   Sydney [name] is a 85 y.o. female patient admitted with weakness in the setting of progressive pancytopenia and transaminitis.   HPI:   Per chart review, patient was admitted with weakness in the setting of progressive pancytopenia. She has been having generalized weakness and fatigue since at least 2018-10-30 after her sister died. She used to be very active and walked in the mall at least 2-3 times a week. She is unable to cook a meal without sitting down to rest before eating it. She has experienced anorexia and weight loss.   On interview, Ms. Mattia is difficult of hearing but her daughter is at bedside and is able to repeat questions that the patient is unable to hear. Ms. Tuzzolino reports that she is doing pretty good. She denies a history of depression or anxiety. She denies SI, HI or AVH. She denies problems with sleep. She reports that her appetite is improving. She denies GI upset. Her daughter reports progressive weakness since 10/30/22 and recent difficulty with ambulation and poor appetite since Thursday. She is normally independent of her ADLs. She does not believe that her mother's mood has changed and denies a known history of depression or anxiety. She was educated about the  symptoms of depression. She was informed that it is unclear if her symptoms are solely attributed to depression versus a medical condition (given worsening pancytopenia). She reports that she is uncomfortable with starting a medication for mood if it is not for certain that her symptoms are solely attributed to depression. She was informed about the benefits of Remeron but declines starting this medication at this time. She is open to receiving resources for an outpatient psychiatrist for patient to establish care.      Past Psychiatric History: Denies   Risk to Self:  None. Denies SI.  Risk to Others:  None. Denies HI.  Prior Inpatient Therapy:  Denies  Prior Outpatient Therapy:  Denies   Past Medical History:  Past Medical History:  Diagnosis Date  . Anemia    years ago  . Arthritis    "left hand" (02/17/2013)  . C. difficile diarrhea   . Glaucoma   . High cholesterol    "on RX years ago" (02/17/2013)  . Hypertension   . Meningioma ([place]) 05/01/2015  . Osteoporosis 02/2014   T score -2.5  followed by Dr. Lysle Rubens  . Palpitations    PACs, PVCs and short runs of atrial tachycardia on Holter monitoring  . TIA (transient ischemic attack) 1990's    Past Surgical History:  Procedure Laterality Date  . ANAL FISTULECTOMY  1970  . COLONOSCOPY    . COLONOSCOPY WITH PROPOFOL N/A 12/02/2016   Procedure: COLONOSCOPY WITH PROPOFOL;  Surgeon: Gatha Mayer, MD;  Location: [facility] ENDOSCOPY;  Service: Endoscopy;  Laterality: N/A;  . 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 TONSILLECTOMY     Family History:  Family History  Problem Relation Age of Onset  . Hypertension Father   . Heart attack Father   . Stroke Brother   . Stroke Maternal Grandmother    Family Psychiatric  History: Denies  Social History:  Social History   Substance and Sexual Activity  Alcohol Use No      Social History   Substance and Sexual Activity  Drug Use No    Social History   Socioeconomic History  . Marital status: Widowed    Spouse name: Not on file  . Number of children: 1  . Years of education: 8  . Highest education level: Not on file  Occupational History  . Occupation: retired  Scientific laboratory technician  . Financial resource strain: Not on file  . Food insecurity    Worry: Not on file    Inability: Not on file  . Transportation needs    Medical: Not on file    Non-medical: Not on file  Tobacco Use  . Smoking status: Never Smoker  . Smokeless tobacco: Never Used  Substance and Sexual Activity  . Alcohol use: No  . Drug use: No  . Sexual activity: Never    Comment: 1st intercourse 85 yo-Fewer than 5 partners  Lifestyle  . Physical activity    Days per week: Not on file    Minutes per session: Not on file  . Stress: Not on file  Relationships  . Social Herbalist on phone: Not on file    Gets together: Not on file    Attends religious service: Not on file    Active member of club or organization: Not on file    Attends meetings of clubs or organizations: Not on file    Relationship status: Not on file  Other Topics Concern  . Not on file  Social History Narrative   Patient does not drink caffeine.   Patient is right handed.    Additional Social History: She lives at home with her daughter. She does not use alcohol or illicit drugs.     Allergies:   Allergies  Allergen Reactions  . Demerol [Meperidine] Other (See Comments)    Excessive sweating, cramping  . Other     No Antibiotic without consultation with her primary physician.  . Codeine Rash  . Sulfa Antibiotics Itching and Other (See Comments)    Reaction unknown  . Tomato Itching    Labs:  Results for orders placed or performed during the hospital encounter of 12/19/18 (from the past 48 hour(s))  Cortisol-am, blood     Status: None   Collection Time: 12/21/18  4:27 PM  Result Value  Ref Range   Cortisol - AM 8.3 6.7 - 22.6 ug/dL    Comment: Performed at [facility], [address], [place] [address]  CBC with Differential/Platelet     Status: Abnormal   Collection Time: 12/22/18  3:01 AM  Result Value Ref Range   WBC 1.9 (L) 4.0 - 10.5 K/uL   RBC 3.38 (L) 3.87 - 5.11 MIL/uL   Hemoglobin 10.5 (L) 12.0 - 15.0 g/dL   HCT 31.3 (L) 36.0 - 46.0 %   MCV 92.6 80.0 - 100.0 fL   MCH 31.1 26.0 - 34.0 pg   MCHC 33.5 30.0 - 36.0 g/dL  RDW 14.7 11.5 - 15.5 %   Platelets 101 (L) 150 - 400 K/uL    Comment: REPEATED TO VERIFY Immature Platelet Fraction may be clinically indicated, consider ordering this additional test [id number] CONSISTENT WITH PREVIOUS RESULT    nRBC 0.0 0.0 - 0.2 %   Neutrophils Relative % 75 %   Neutro Abs 1.4 (L) 1.7 - 7.7 K/uL   Lymphocytes Relative 15 %   Lymphs Abs 0.3 (L) 0.7 - 4.0 K/uL   Monocytes Relative 7 %   Monocytes Absolute 0.1 0.1 - 1.0 K/uL   Eosinophils Relative 3 %   Eosinophils Absolute 0.1 0.0 - 0.5 K/uL   Basophils Relative 0 %   Basophils Absolute 0.0 0.0 - 0.1 K/uL   Immature Granulocytes 0 %   Abs Immature Granulocytes 0.00 0.00 - 0.07 K/uL    Comment: Performed at [facility], [place] [address], [place], [place] [address]  Comprehensive metabolic panel     Status: Abnormal   Collection Time: 12/22/18  3:01 AM  Result Value Ref Range   Sodium 134 (L) 135 - 145 mmol/L   Potassium 3.6 3.5 - 5.1 mmol/L   Chloride 114 (H) 98 - 111 mmol/L   CO2 15 (L) 22 - 32 mmol/L   Glucose, Bld 81 70 - 99 mg/dL   BUN 13 8 - 23 mg/dL   Creatinine, Ser 0.70 0.44 - 1.00 mg/dL   Calcium 8.2 (L) 8.9 - 10.3 mg/dL   Total Protein 4.9 (L) 6.5 - 8.1 g/dL   Albumin 2.1 (L) 3.5 - 5.0 g/dL   AST 287 (H) 15 - 41 U/L   ALT 136 (H) 0 - 44 U/L   Alkaline Phosphatase 83 38 - 126 U/L   Total Bilirubin 0.5 0.3 - 1.2 mg/dL   GFR calc non Af Amer >60 >60 mL/min   GFR calc Af Amer >60 >60 mL/min   Anion gap 5 5 - 15    Comment:  Performed at [facility], [place] [address] [place], [place] [address]  Magnesium     Status: None   Collection Time: 12/22/18  3:01 AM  Result Value Ref Range   Magnesium 1.8 1.7 - 2.4 mg/dL    Comment: Performed at [place] [address], [place], [place] [address]  Glucose, capillary     Status: None   Collection Time: 12/22/18  8:23 AM  Result Value Ref Range   Glucose-Capillary 71 70 - 99 mg/dL  CBC with Differential/Platelet     Status: Abnormal   Collection Time: 12/23/18  5:54 AM  Result Value Ref Range   WBC 1.8 (L) 4.0 - 10.5 K/uL   RBC 3.36 (L) 3.87 - 5.11 MIL/uL   Hemoglobin 10.5 (L) 12.0 - 15.0 g/dL   HCT 30.5 (L) 36.0 - 46.0 %   MCV 90.8 80.0 - 100.0 fL   MCH 31.3 26.0 - 34.0 pg   MCHC 34.4 30.0 - 36.0 g/dL   RDW 14.6 11.5 - 15.5 %   Platelets 101 (L) 150 - 400 K/uL    Comment: REPEATED TO VERIFY Immature Platelet Fraction may be clinically indicated, consider ordering this additional test [id number] CONSISTENT WITH PREVIOUS RESULT    nRBC 0.0 0.0 - 0.2 %   Neutrophils Relative % 69 %   Neutro Abs 1.2 (L) 1.7 - 7.7 K/uL   Lymphocytes Relative 19 %   Lymphs Abs 0.3 (L) 0.7 - 4.0 K/uL   Monocytes Relative 7 %  Monocytes Absolute 0.1 0.1 - 1.0 K/uL   Eosinophils Relative 4 %   Eosinophils Absolute 0.1 0.0 - 0.5 K/uL   Basophils Relative 0 %   Basophils Absolute 0.0 0.0 - 0.1 K/uL   Immature Granulocytes 1 %   Abs Immature Granulocytes 0.02 0.00 - 0.07 K/uL    Comment: Performed at [facility], [place] [address] [place], [place] [address]  Comprehensive metabolic panel     Status: Abnormal   Collection Time: 12/23/18  5:54 AM  Result Value Ref Range   Sodium 134 (L) 135 - 145 mmol/L   Potassium 3.4 (L) 3.5 - 5.1 mmol/L   Chloride 114 (H) 98 - 111 mmol/L   CO2 16 (L) 22 - 32 mmol/L   Glucose, Bld 98 70 - 99 mg/dL   BUN 9 8 - 23 mg/dL   Creatinine, Ser 0.67 0.44 - 1.00 mg/dL   Calcium 7.9 (L) 8.9 - 10.3 mg/dL   Total Protein 4.8 (L)  6.5 - 8.1 g/dL   Albumin 1.9 (L) 3.5 - 5.0 g/dL   AST 311 (H) 15 - 41 U/L   ALT 141 (H) 0 - 44 U/L   Alkaline Phosphatase 86 38 - 126 U/L   Total Bilirubin 0.5 0.3 - 1.2 mg/dL   GFR calc non Af Amer >60 >60 mL/min   GFR calc Af Amer >60 >60 mL/min   Anion gap 4 (L) 5 - 15    Comment: Performed at [facility] [place] [facility], [place] [address], [place],  [address]  Magnesium     Status: None   Collection Time: 12/23/18  5:54 AM  Result Value Ref Range   Magnesium 1.8 1.7 - 2.4 mg/dL    Comment: Performed at [facility] [address], [place], [place] [address]  Glucose, capillary     Status: None   Collection Time: 12/23/18  8:54 AM  Result Value Ref Range   Glucose-Capillary 93 70 - 99 mg/dL    Medications:  Current Facility-Administered Medications  Medication Dose Route Frequency Provider Last Rate Last Dose  . 0.9 %  sodium chloride infusion  250 mL Intravenous PRN Opyd, Ilene Qua, MD      . acetaminophen (TYLENOL) tablet 650 mg  650 mg Oral Q6H PRN Opyd, Ilene Qua, MD       Or  . acetaminophen (TYLENOL) suppository 650 mg  650 mg Rectal Q6H PRN Opyd, Ilene Qua, MD      . aspirin EC tablet 81 mg  81 mg Oral Daily Opyd, Ilene Qua, MD   81 mg at 12/21/18 1112  . clopidogrel (PLAVIX) tablet 75 mg  75 mg Oral Daily Opyd, Ilene Qua, MD   75 mg at 12/21/18 1112  . cyanocobalamin ((VITAMIN B-12)) injection 1,000 mcg  1,000 mcg Intramuscular Daily Nicholas Lose, MD   1,000 mcg at 12/23/18 0952  . feeding supplement (BOOST / RESOURCE BREEZE) liquid 1 Container  1 Container Oral TID BM Aline August, MD   1 Container at 12/22/18 2020  . feeding supplement (PRO-STAT SUGAR FREE 64) liquid 30 mL  30 mL Oral BID WC Darliss Cheney, MD   30 mL at 12/21/18 1727  . folic acid (FOLVITE) tablet 1 mg  1 mg Oral Daily Opyd, Ilene Qua, MD   1 mg at 12/21/18 1112  . Gerhardt's butt cream   Topical TID Darliss Cheney, MD   1 application at [id number] [phone]  . megestrol (MEGACE) tablet 40 mg  40  mg Oral  Daily Vena Rua, PA-C      . multivitamin with minerals tablet 1 tablet  1 tablet Oral Daily Darliss Cheney, MD   1 tablet at 12/21/18 1113  . ondansetron (ZOFRAN) tablet 4 mg  4 mg Oral Q6H PRN Opyd, Ilene Qua, MD       Or  . ondansetron (ZOFRAN) injection 4 mg  4 mg Intravenous Q6H PRN Opyd, Ilene Qua, MD      . pantoprazole (PROTONIX) EC tablet 20 mg  20 mg Oral Daily Opyd, Ilene Qua, MD   20 mg at 12/21/18 1113  . sodium bicarbonate 150 mEq in dextrose 5% 1000 mL infusion  150 mEq Intravenous Continuous Alekh, Kshitiz, MD 75 mL/hr at 12/23/18 1000 150 mEq at 12/23/18 1000  . sodium chloride flush (NS) 0.9 % injection 3 mL  3 mL Intravenous Q12H Opyd, Ilene Qua, MD   3 mL at 12/22/18 2206  . sodium chloride flush (NS) 0.9 % injection 3 mL  3 mL Intravenous Q12H Opyd, Ilene Qua, MD   3 mL at 12/23/18 0953  . sodium chloride flush (NS) 0.9 % injection 3 mL  3 mL Intravenous PRN Opyd, Ilene Qua, MD        Musculoskeletal: Strength & Muscle Tone: atrophy associated with aging.  Gait & Station: UTA since patient is lying in bed. Patient leans: N/A  Psychiatric Specialty Exam: Physical Exam  Nursing note and vitals reviewed. Constitutional: She is oriented to person, place, and time. She appears well-developed.  thin  HENT:  Head: Normocephalic and atraumatic.  Neck: Normal range of motion.  Respiratory: Effort normal.  Musculoskeletal: Normal range of motion.  Neurological: She is alert and oriented to person, place, and time.  Psychiatric: She has a normal mood and affect. Judgment and thought content normal. She is slowed. Cognition and memory are normal.    Review of Systems  Gastrointestinal: Negative for nausea.  Psychiatric/Behavioral: Negative for depression, hallucinations, substance abuse and suicidal ideas. The patient does not have insomnia.   All other systems reviewed and are negative.   Blood pressure 103/63, pulse 72, temperature 98.7 F (37.1 C), temperature  source Oral, resp. rate 18, height 5\' 3"  (1.6 m), weight 50.5 kg, SpO2 100 %.Body mass index is 19.72 kg/m.  General Appearance: Fairly Groomed, thin, elderly, African American female, wearing a hospital gown who is lying in bed. NAD.   Eye Contact:  Good  Speech:  Clear and Coherent and Normal Rate  Volume:  Decreased  Mood:  "I'm doing pretty good."  Affect:  Appropriate  Thought Process:  Linear and Descriptions of Associations: Intact  Orientation:  Full (Time, Place, and Person)  Thought Content:  Logical  Suicidal Thoughts:  No  Homicidal Thoughts:  No  Memory:  Immediate;   Good Recent;   Good Remote;   Good  Judgement:  Fair  Insight:  Fair  Psychomotor Activity:  Decreased  Concentration:  Concentration: Good and Attention Span: Good  Recall:  Good  Fund of Knowledge:  Good  Language:  Good  Akathisia:  No  Handed:  Right  AIMS (if indicated):   N/A  Assets:  Communication Skills Housing Resilience Social Support  ADL's:  Impaired  Cognition:  WNL  Sleep:   Okay   Assessment:  GAIL SUMMAR is a 85 y.o. female who was admitted with weakness in the setting of progressive pancytopenia. Patient denies mood symptoms. She denies SI, HI or AVH. Her mood appears  appropriate although she appears lethargic. Patient's daughter reports progressive weakness since June and problems with ambulation and poor appetite since Thursday. It is unclear if her symptoms are attributed to a mood disorder although she recently loss her sister in June. Patient's daughter is uncomfortable with starting a medication for poor appetite and mood if it is not for certain that her symptoms are solely attributed to a mood disorder. Recommend SW provide patient with outpatient resources to establish care with a psychiatrist.   Treatment Plan Summary: -Patient's daughter declines medication management at this time and will be provided with resources for patient to follow up with an outpatient  psychiatrist.  -EKG reviewed and QTc 398 on 8/24. Please closely monitor when starting or increasing QTc prolonging agents.   -Psychiatry will sign off on patient at this time. Please consult psychiatry again as needed.     Disposition: No evidence of imminent risk to self or others at present.   Patient does not meet criteria for psychiatric inpatient admission.  This service was provided via telemedicine using a 2-way, interactive audio and video technology.  Names of all persons participating in this telemedicine service and their role in this encounter. Name: Feige Krutz Role: Patient's daughter  Name: Sydney [name] Role: Patient  Name: Buford Dresser, DO Role: [place], DO 12/23/2018 12:43 PM

## 2018-12-23 NOTE — Progress Notes (Signed)
    Progress Note   Subjective  Says she is feeling better but remains weak.    Objective  Vital signs in last 24 hours: Temp:  [98.5 F (36.9 C)-99.5 F (37.5 C)] 98.7 F (37.1 C) (08/28 0458) Pulse Rate:  [68-74] 72 (08/28 0458) Resp:  [16-18] 18 (08/28 0458) BP: (103-113)/(53-70) 103/63 (08/28 0458) SpO2:  [100 %] 100 % (08/28 0458) Weight:  [50.5 kg] 50.5 kg (08/28 0500) Last BM Date: 12/21/18  General: Alert, well-developed, thin, elderly, frail, in NAD Heart:  Regular rate and rhythm; no murmurs Chest: Clear to ascultation bilaterally Abdomen:  Soft, nontender and nondistended. Normal bowel sounds, without guarding, and without rebound.   Extremities:  Without edema. Neurologic:  Alert and  oriented x4; grossly normal neurologically. Psych:  Alert and cooperative. Normal mood and affect.  Intake/Output from previous day: 08/27 0701 - 08/28 0700 In: 592.6 [P.O.:180; I.V.:412.6] Out: 500 [Urine:500] Intake/Output this shift: No intake/output data recorded.  Lab Results: Recent Labs    12/21/18 0228 12/22/18 0301 12/23/18 0554  WBC 1.6* 1.9* 1.8*  HGB 10.0* 10.5* 10.5*  HCT 29.5* 31.3* 30.5*  PLT 111* 101* 101*   BMET Recent Labs    12/21/18 0228 12/22/18 0301 12/23/18 0554  NA 132* 134* 134*  K 3.2* 3.6 3.4*  CL 112* 114* 114*  CO2 14* 15* 16*  GLUCOSE 77 81 98  BUN 11 13 9   CREATININE 0.70 0.70 0.67  CALCIUM 7.8* 8.2* 7.9*   LFT Recent Labs    12/23/18 0554  PROT 4.8*  ALBUMIN 1.9*  AST 311*  ALT 141*  ALKPHOS 86  BILITOT 0.5    Studies/Results: Mr Brain Wo Contrast  Result Date: 12/21/2018 CLINICAL DATA:  Encephalopathy EXAM: MRI HEAD WITHOUT CONTRAST TECHNIQUE: Multiplanar, multiecho pulse sequences of the brain and surrounding structures were obtained without intravenous contrast. COMPARISON:  Brain MRI 10/02/2016 Head CT 12/15/2018 FINDINGS: BRAIN: There is no acute infarct, acute hemorrhage or extra-axial collection. Early  confluent hyperintense T2-weighted signal of the periventricular and deep white matter, most commonly due to chronic ischemic microangiopathy. There is generalized atrophy without lobar predilection. The midline structures are normal. VASCULAR: Susceptibility-sensitive sequences show no chronic microhemorrhage or superficial siderosis. The major intracranial arterial and venous sinus flow voids are normal. SKULL AND UPPER CERVICAL SPINE: Calvarial bone marrow signal is normal. There is no skull base mass. The visualized upper cervical spine and soft tissues are normal. SINUSES/ORBITS: There are no fluid levels or advanced mucosal thickening. The mastoid air cells and middle ear cavities are free of fluid. The orbits are normal. IMPRESSION: Atrophy and chronic ischemic microangiopathy without acute intracranial abnormality. Electronically Signed   By: Ulyses Jarred M.D.   On: 12/21/2018 21:03      Assessment & Recommendations   1. Elevated transaminases. EBV IgM negative. EBV IgG positive. CMV IgM negative. Hepatitis A, B, C are negative. Ferritin 1,758 and iron 51. ANA, AMA, AMSA pending. Schedule RUQ [place].     LOS: 3 days   Norberto Sorenson T. Fuller Plan MD  12/23/2018, 8:47 AM

## 2018-12-23 NOTE — Progress Notes (Signed)
Patient ID: Sydney [name], female   DOB: [birth year], 85 y.o.   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  CT of the abdomen and pelvis was negative for acute intra-abdominal or pelvic abnormality.  She was found to have mildly elevated LFTs along with hyponatremia and pancytopenia.  She was given IV fluids.  ID and GI were consulted.  Assessment & Plan:   Generalized weakness Moderate protein calorie malnutrition/failure to thrive Hypoalbuminemia -Questionable cause. -There might be a component of depression as well leading to very poor oral intake.  Discussed the same with [name].  She had initially wanted to discuss with PCP before starting antidepressant.  Yesterday she requested psychiatric evaluation.  Psychiatry consultation is pending. -CT of the head was negative for acute intracranial abnormality. -MRI of the brain showed no evidence of acute stroke.  She might need outpatient neurology evaluation. -No evidence of UTI.  Monitor off antibiotics. -PT/OT evaluation.  Patient looks very deconditioned and might benefit from SNF placement. -Oral intake is still poor.  Switch IV fluids to bicarbonate at 75 cc an hour.  GI has started Megace.  Encourage patient to increase oral intake. -Patient apparently refused all oral meds this morning. -Follow nutrition consult recommendations -Overall prognosis is guarded to poor.  Palliative care consultation is pending.  Elevated LFTs -LFTs still elevated.  Abdominal exam is benign.  GI following.  Hepatitis panel negative.  EBV and CMV IgM are negative.  GI has ordered right  upper quadrant ultrasound. -Monitor LFTs. -CT of the abdomen with no evidence of acute intra-abdominal or pelvic abnormality but with diffuse fluid within the colon suggestive of possible enteritis or diarrheal illness  Pancytopenia -Questionable cause.  Monitor.  Hematology evaluation appreciated.  Patient has been started on vitamin B12 parenterally as she had low levels of vitamin B12 during last admission but vitamin B12 level normal this admission. -Outpatient follow-up with hematology.  No plans for bone marrow biopsy during this admission.  Hyponatremia -Sodium 128 in the ED.  134 today.  IV fluids as above  Hypokalemia -Replace.  Repeat a.m. labs  History of TIA -continue aspirin and Plavix  Stage II sacral pressure injury: Present on admission -Wound care as per wound care consult recommendations.    DVT prophylaxis: SCDs Code Status: Full Family Communication: Spoke to daughter at bedside and again on phone on 12/22/2018 Disposition Plan: Home versus SNF in 1 to 2 days if clinically improves  Consultants: ID/GI/hematology/palliative care  Procedures: None  Antimicrobials: None   Subjective: Patient seen and examined at bedside.  She is awake, poor historian.  States that she feels a little better.  Nursing staff reports that she refused all her medications and her oral intake is still poor.  No overnight fever or vomiting.   Objective: Vitals:   12/22/18 1604 12/22/18 2014 12/23/18 0458 12/23/18 0500  BP: 111/70 (!) 113/53 103/63   Pulse: 68 74 72   Resp: '18 16 18   ' Temp: 98.5 F (36.9 C) 99.5 F (37.5 C) 98.7 F (37.1 C)   TempSrc: Oral Oral Oral   SpO2: 100% 100% 100%   Weight:    50.5 kg  Height:  Intake/Output Summary (Last 24 hours) at 12/23/2018 1028 Last data filed at 12/23/2018 0953 Gross per 24 hour  Intake 535.59 ml  Output 500 ml  Net 35.59 ml   Filed Weights   12/21/18 0500 12/22/18 0618 12/23/18 0500  Weight: 47.7 kg 49 kg 50.5 kg      Examination:  General exam: No acute distress.  Looks older than stated age.  Very poor historian.  Does not like to communicate much. Respiratory system: Bilateral decreased breath sounds at bases; no wheezing cardiovascular system: S1-S2 heard, rate controlled Gastrointestinal system: Abdomen is nondistended, soft and nontender. Normal bowel sounds heard. Extremities: No cyanosis, edema   Data Reviewed: I have personally reviewed following labs and imaging studies  CBC: Recent Labs  Lab 12/16/18 1115  12/20/18 0012 12/20/18 0554 12/21/18 0228 12/22/18 0301 12/23/18 0554  WBC 3.4*   < > 3.0* 1.9* 1.6* 1.9* 1.8*  NEUTROABS 2.3  --  2.1 1.4*  --  1.4* 1.2*  HGB 10.7*   < > 11.1* 10.0* 10.0* 10.5* 10.5*  HCT 32.2*   < > 33.7* 29.6* 29.5* 31.3* 30.5*  MCV 95.8   < > 94.7 94.0 92.2 92.6 90.8  PLT 127*   < > 113* 114* 111* 101* 101*   < > = values in this interval not displayed.   Basic Metabolic Panel: Recent Labs  Lab 12/18/18 0524 12/20/18 0012 12/20/18 0554 12/21/18 0228 12/22/18 0301 12/23/18 0554  NA 132* 128* 132* 132* 134* 134*  K 3.7 3.5 3.4* 3.2* 3.6 3.4*  CL 111 104 109 112* 114* 114*  CO2 16* 16* 17* 14* 15* 16*  GLUCOSE 69* 89 72 77 81 98  BUN '9 11 10 11 13 9  ' CREATININE 0.86 0.83 0.81 0.70 0.70 0.67  CALCIUM 8.1* 8.3* 7.8* 7.8* 8.2* 7.9*  MG 1.7  --   --  1.7 1.8 1.8   GFR: Estimated Creatinine Clearance: 40.2 mL/min (by C-G formula based on SCr of 0.67 mg/dL). Liver Function Tests: Recent Labs  Lab 12/20/18 0012 12/20/18 0554 12/21/18 0228 12/22/18 0301 12/23/18 0554  AST 268* 234* 229* 287* 311*  ALT 135* 119* 114* 136* 141*  ALKPHOS 81 75 71 83 86  BILITOT 0.7 0.6 0.7 0.5 0.5  PROT 5.6* 4.7* 4.8* 4.9* 4.8*  ALBUMIN 2.3* 2.1* 1.9* 2.1* 1.9*   Recent Labs  Lab 12/20/18 0012  LIPASE 123*   No results for input(s): AMMONIA in the last 168 hours. Coagulation Profile: Recent Labs  Lab 12/16/18 1316  INR 1.1   Cardiac  Enzymes: Recent Labs  Lab 12/16/18 1115  CKTOTAL 140   BNP (last 3 results) No results for input(s): PROBNP in the last 8760 hours. HbA1C: No results for input(s): HGBA1C in the last 72 hours. CBG: Recent Labs  Lab 12/20/18 0850 12/21/18 0818 12/21/18 1155 12/22/18 0823 12/23/18 0854  GLUCAP 70 76 82 71 93   Lipid Profile: No results for input(s): CHOL, HDL, LDLCALC, TRIG, CHOLHDL, LDLDIRECT in the last 72 hours. Thyroid Function Tests: No results for input(s): TSH, T4TOTAL, FREET4, T3FREE, THYROIDAB in the last 72 hours. Anemia Panel: No results for input(s): VITAMINB12, FOLATE, FERRITIN, TIBC, IRON, RETICCTPCT in the last 72 hours. Sepsis Labs: Recent Labs  Lab 12/20/18 0012  LATICACIDVEN 1.4    Recent Results (from the past 240 hour(s))  Urine culture     Status: Abnormal   Collection Time: 12/15/18  8:11 AM   Specimen: Urine, Random  Result Value Ref Range Status  Specimen Description URINE, RANDOM  Final   Special Requests NONE  Final   Culture (A)  Final    <10,000 COLONIES/mL INSIGNIFICANT GROWTH Performed at [facility], [place] [address] [place], [place] [address]    Report Status 12/16/2018 FINAL  Final  SARS Coronavirus 2 [facility] order, Performed in [facility] hospital lab) Nasopharyngeal Nasopharyngeal Swab     Status: None   Collection Time: 12/15/18  8:11 AM   Specimen: Nasopharyngeal Swab  Result Value Ref Range Status   SARS Coronavirus 2 NEGATIVE NEGATIVE Final    Comment: (NOTE) If result is NEGATIVE SARS-CoV-2 target nucleic acids are NOT DETECTED. The SARS-CoV-2 RNA is generally detectable in upper and lower  respiratory specimens during the acute phase of infection. The lowest  concentration of SARS-CoV-2 viral copies this assay can detect is 250  copies / mL. A negative result does not preclude SARS-CoV-2 infection  and should not be used as the sole basis for treatment or other  patient management decisions.  A negative result  may occur with  improper specimen collection / handling, submission of specimen other  than nasopharyngeal swab, presence of viral mutation(s) within the  areas targeted by this assay, and inadequate number of viral copies  (<250 copies / mL). A negative result must be combined with clinical  observations, patient history, and epidemiological information. If result is POSITIVE SARS-CoV-2 target nucleic acids are DETECTED. The SARS-CoV-2 RNA is generally detectable in upper and lower  respiratory specimens dur ing the acute phase of infection.  Positive  results are indicative of active infection with SARS-CoV-2.  Clinical  correlation with patient history and other diagnostic information is  necessary to determine patient infection status.  Positive results do  not rule out bacterial infection or co-infection with other viruses. If result is PRESUMPTIVE POSTIVE SARS-CoV-2 nucleic acids MAY BE PRESENT.   A presumptive positive result was obtained on the submitted specimen  and confirmed on repeat testing.  While 2019 novel coronavirus  (SARS-CoV-2) nucleic acids may be present in the submitted sample  additional confirmatory testing may be necessary for epidemiological  and / or clinical management purposes  to differentiate between  SARS-CoV-2 and other Sarbecovirus currently known to infect humans.  If clinically indicated additional testing with an alternate test  methodology [phone]) is advised. The SARS-CoV-2 RNA is generally  detectable in upper and lower respiratory sp ecimens during the acute  phase of infection. The expected result is Negative. Fact Sheet for Patients:  StrictlyIdeas.no Fact Sheet for Healthcare Providers: BankingDealers.co.za This test is not yet approved or cleared by the [place] FDA and has been authorized for detection and/or diagnosis of SARS-CoV-2 by FDA under an Emergency Use Authorization (EUA).   This EUA will remain in effect (meaning this test can be used) for the duration of the COVID-19 declaration under Section 564(b)(1) of the Act, 21 U.S.C. section 360bbb-3(b)(1), unless the authorization is terminated or revoked sooner. Performed at [facility], [place] [address], [place], [place] [address]   Urine Culture     Status: Abnormal   Collection Time: 12/16/18  1:32 PM   Specimen: Urine, Random  Result Value Ref Range Status   Specimen Description URINE, RANDOM  Final   Special Requests   Final    NONE Performed at [facility], [place] [address], [place], [place] [address]    Culture >=100,000 COLONIES/mL KLEBSIELLA PNEUMONIAE (A)  Final   Report Status 12/20/2018 FINAL  Final   Organism ID, Bacteria KLEBSIELLA PNEUMONIAE (A)  Final      Susceptibility   Klebsiella pneumoniae - MIC*    AMPICILLIN RESISTANT Resistant     CEFAZOLIN <=4 SENSITIVE Sensitive     CEFTRIAXONE <=1 SENSITIVE Sensitive     CIPROFLOXACIN <=0.25 SENSITIVE Sensitive     GENTAMICIN <=1 SENSITIVE Sensitive     IMIPENEM <=0.25 SENSITIVE Sensitive     NITROFURANTOIN <=16 SENSITIVE Sensitive     TRIMETH/SULFA <=20 SENSITIVE Sensitive     AMPICILLIN/SULBACTAM 4 SENSITIVE Sensitive     PIP/TAZO <=4 SENSITIVE Sensitive     Extended ESBL NEGATIVE Sensitive     * >=100,000 COLONIES/mL KLEBSIELLA PNEUMONIAE  Urine Culture     Status: Abnormal   Collection Time: 12/19/18  4:12 AM   Specimen: Urine, Clean Catch  Result Value Ref Range Status   Specimen Description URINE, CLEAN CATCH  Final   Special Requests NONE  Final   Culture (A)  Final    <10,000 COLONIES/mL INSIGNIFICANT GROWTH Performed at [facility], [address] [place], [place] [address]    Report Status 12/21/2018 FINAL  Final  Blood culture (routine x 2)     Status: None (Preliminary result)   Collection Time: 12/20/18 12:43 AM   Specimen: BLOOD  Result Value Ref Range Status   Specimen Description BLOOD RIGHT  ANTECUBITAL  Final   Special Requests   Final    BOTTLES DRAWN AEROBIC AND ANAEROBIC Blood Culture adequate volume   Culture   Final    NO GROWTH 2 DAYS Performed at [facility], [place] [address], [place], [place] [address]    Report Status PENDING  Incomplete  SARS CORONAVIRUS 2 (TAT 6-12 HRS) Nasal Swab Aptima Multi Swab     Status: None   Collection Time: 12/20/18  3:52 AM   Specimen: Aptima Multi Swab; Nasal Swab  Result Value Ref Range Status   SARS Coronavirus 2 NEGATIVE NEGATIVE Final    Comment: (NOTE) SARS-CoV-2 target nucleic acids are NOT DETECTED. The SARS-CoV-2 RNA is generally detectable in upper and lower respiratory specimens during the acute phase of infection. Negative results do not preclude SARS-CoV-2 infection, do not rule out co-infections with other pathogens, and should not be used as the sole basis for treatment or other patient management decisions. Negative results must be combined with clinical observations, patient history, and epidemiological information. The expected result is Negative. Fact Sheet for Patients: SugarRoll.be Fact Sheet for Healthcare Providers: https://www.woods-mathews.com/ This test is not yet approved or cleared by the [place] FDA and  has been authorized for detection and/or diagnosis of SARS-CoV-2 by FDA under an Emergency Use Authorization (EUA). This EUA will remain  in effect (meaning this test can be used) for the duration of the COVID-19 declaration under Section 56 4(b)(1) of the Act, 21 U.S.C. section 360bbb-3(b)(1), unless the authorization is terminated or revoked sooner. Performed at [facility], [place] [address] [place], [place] [address]          Radiology Studies: Mr Brain 4 Contrast  Result Date: 12/21/2018 CLINICAL DATA:  Encephalopathy EXAM: MRI HEAD WITHOUT CONTRAST TECHNIQUE: Multiplanar, multiecho pulse sequences of the brain and surrounding  structures were obtained without intravenous contrast. COMPARISON:  Brain MRI 10/02/2016 Head CT 12/15/2018 FINDINGS: BRAIN: There is no acute infarct, acute hemorrhage or extra-axial collection. Early confluent hyperintense T2-weighted signal of the periventricular and deep white matter, most commonly due to chronic ischemic microangiopathy. There is  generalized atrophy without lobar predilection. The midline structures are normal. VASCULAR: Susceptibility-sensitive sequences show no chronic microhemorrhage or superficial siderosis. The major intracranial arterial and venous sinus flow voids are normal. SKULL AND UPPER CERVICAL SPINE: Calvarial bone marrow signal is normal. There is no skull base mass. The visualized upper cervical spine and soft tissues are normal. SINUSES/ORBITS: There are no fluid levels or advanced mucosal thickening. The mastoid air cells and middle ear cavities are free of fluid. The orbits are normal. IMPRESSION: Atrophy and chronic ischemic microangiopathy without acute intracranial abnormality. Electronically Signed   By: Ulyses Jarred M.D.   On: 12/21/2018 21:03        Scheduled Meds:  aspirin EC  81 mg Oral Daily   clopidogrel  75 mg Oral Daily   cyanocobalamin  1,000 mcg Intramuscular Daily   feeding supplement  1 Container Oral TID BM   feeding supplement (PRO-STAT SUGAR FREE 64)  30 mL Oral BID WC   folic acid  1 mg Oral Daily   Gerhardt's butt cream   Topical TID   megestrol  40 mg Oral Daily   multivitamin with minerals  1 tablet Oral Daily   pantoprazole  20 mg Oral Daily   sodium chloride flush  3 mL Intravenous Q12H   sodium chloride flush  3 mL Intravenous Q12H   Continuous Infusions:  sodium chloride     sodium bicarbonate 150 mEq in dextrose 5% 1000 mL 150 mEq (12/23/18 1000)     LOS: 3 days        Aline August, MD [facility] 12/23/2018, 10:28 AM

## 2018-12-23 NOTE — Progress Notes (Signed)
Per pt's daughter, pt had a couple of sip of fluids. Pt continuously refusing to take anything to eat.

## 2018-12-23 NOTE — Progress Notes (Signed)
Palliative:  Consult received and chart reviewed. Unable to speak with daughter today - will attempt to reach out over weekend.   Thank you for this consult.  Juel Burrow, DNP, AGNP-C Palliative Medicine Team Team Phone # [phone]  Pager # [phone]  NO CHARGE

## 2018-12-23 NOTE — Progress Notes (Signed)
Occupational Therapy Treatment Patient Details Name: Sydney [name] MRN: [id number] DOB: 06/05/1930 Today's Date: 12/23/2018    History of present illness Sydney [name] is a 85 y.o. 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   OT comments  Pt making slow progress with functional goals. Pt on BSC with NT bathing upon arrival. Pt continues to have decreased appetite, although is NPO today due to pending ultrasound per pt's daughter. Pt limited by generalized weakness and balance, however pt did ambulate to bathroom and room door and back to recliner with min A for balance/support and mod verbal and physical cues to use RW. Pt's [name] is present. OT will continue to follow acutely  Follow Up Recommendations  Home health OT;Supervision/Assistance - 24 hour    Equipment Recommendations  3 in 1 bedside commode    Recommendations for Other Services      Precautions / Restrictions Precautions Precautions: Fall Precaution Comments: had near syncopal episode on [facility] at home before coming in Restrictions Weight Bearing Restrictions: No       Mobility Bed Mobility               General bed mobility comments: pt on BSC finishing bathing with NT upon arrival  Transfers Overall transfer level: Needs assistance Equipment used: Rolling walker (2 wheeled) Transfers: Sit to/from Stand Sit to Stand: Min assist         General transfer comment: min A for power up and steadying    Balance Overall balance assessment: Needs assistance Sitting-balance support: Single extremity supported Sitting balance-Leahy Scale: Fair     Standing balance support: During functional activity;Single extremity  supported;Bilateral upper extremity supported Standing balance-Leahy Scale: Poor                             ADL either performed or assessed with clinical judgement   ADL Overall ADL's : Needs assistance/impaired     Grooming: Minimal assistance;Standing;Wash/dry hands;Wash/dry face           Upper Body Dressing : Sitting;Min guard   Lower Body Dressing: Moderate assistance Lower Body Dressing Details (indicate cue type and reason): able to don socks with increased effort Toilet Transfer: Minimal assistance;Min guard;Ambulation;RW;BSC   Toileting- Water quality scientist and Hygiene: Sit to/from stand;Moderate assistance;Cueing for safety       Functional mobility during ADLs: Minimal assistance;Min guard;Rolling walker;Cueing for safety General ADL Comments: pt limited by generalized weakness and balance, however pt did ambulate to bathroom and room door and back to recliner with min A for balance/support and mod verbal and physical cues to use RW     Vision Baseline Vision/History: Wears glasses Patient Visual Report: No change from baseline     Perception     Praxis      Cognition Arousal/Alertness: Awake/alert Behavior During Therapy: WFL for tasks assessed/performed Overall Cognitive Status: Impaired/Different from baseline Area of Impairment: Following commands;Problem solving;Awareness;Attention                       Following Commands: Follows one step commands with increased time;Follows multi-step commands with increased time     Problem Solving: Slow processing;Decreased initiation;Requires verbal cues;Requires tactile cues General Comments: pt with decreased conversation despite short  direct questions        Exercises     Shoulder Instructions       General Comments      Pertinent Vitals/ Pain       Pain Assessment: No/denies pain  Home Living                                          Prior  Functioning/Environment              Frequency  Min 2X/week        Progress Toward Goals  OT Goals(current goals can now be found in the care plan section)  Progress towards OT goals: Progressing toward goals     Plan      Co-evaluation                 AM-PAC OT "6 Clicks" Daily Activity     Outcome Measure   Help from another person eating meals?: None Help from another person taking care of personal grooming?: A Little Help from another person toileting, which includes using toliet, bedpan, or urinal?: A Lot Help from another person bathing (including washing, rinsing, drying)?: A Lot Help from another person to put on and taking off regular upper body clothing?: A Little Help from another person to put on and taking off regular lower body clothing?: A Lot 6 Click Score: 16    End of Session Equipment Utilized During Treatment: Gait belt;Rolling walker;Other (comment)(BSC)  OT Visit Diagnosis: Muscle weakness (generalized) (M62.81);Unsteadiness on feet (R26.81);Other symptoms and signs involving cognitive function   Activity Tolerance Patient limited by fatigue   Patient Left in chair;with call bell/phone within reach;with family/visitor present   Nurse Communication Mobility status        Time: 1241-1310 OT Time Calculation (min): 29 min  Charges: OT General Charges $OT Visit: 1 Visit OT Treatments $Self Care/Home Management : 8-22 mins $Therapeutic Activity: 8-22 mins     [name] 12/23/2018, 2:56 PM

## 2018-12-23 NOTE — Progress Notes (Signed)
Patient ID: Sydney [name], female   DOB: [birth year], 85 y.o.   MRN: [id number]         [facility]  Date of Admission:  12/19/2018    \  ASSESSMENT: Her CMV IgM antibody is negative.  EBV antibodies indicate remote, active infection.  I do not find any evidence of active infection.   PLAN: 1. I will sign off now  Principal Problem:   Generalized weakness Active Problems:   Pancytopenia (HCC)   Transaminitis   Hyponatremia   Protein calorie malnutrition (HCC)   History of TIA (transient ischemic attack)   Pressure injury of skin   Malnutrition of moderate degree   Scheduled Meds: . aspirin EC  81 mg Oral Daily  . clopidogrel  75 mg Oral Daily  . cyanocobalamin  1,000 mcg Intramuscular Daily  . feeding supplement  1 Container Oral TID BM  . feeding supplement (PRO-STAT SUGAR FREE 64)  30 mL Oral BID WC  . folic acid  1 mg Oral Daily  . Gerhardt's butt cream   Topical TID  . megestrol  40 mg Oral Daily  . multivitamin with minerals  1 tablet Oral Daily  . pantoprazole  20 mg Oral Daily  . sodium chloride flush  3 mL Intravenous Q12H  . sodium chloride flush  3 mL Intravenous Q12H   Continuous Infusions: . sodium chloride    . sodium bicarbonate 150 mEq in dextrose 5% 1000 mL 150 mEq (12/23/18 1000)   PRN Meds:.sodium chloride, acetaminophen **OR** acetaminophen, ondansetron **OR** ondansetron (ZOFRAN) IV, sodium chloride flush   SUBJECTIVE: She has not had any diarrhea, nausea, vomiting or abdominal pain.    Review of Systems: Review of Systems  Constitutional: Positive for malaise/fatigue and weight loss. Negative for chills and fever.  Gastrointestinal: Negative for abdominal pain, diarrhea, nausea and vomiting.    Allergies  Allergen Reactions  . Demerol [Meperidine] Other (See Comments)    Excessive sweating, cramping  . Other     No Antibiotic without consultation with her primary physician.  . Codeine Rash  . Sulfa  Antibiotics Itching and Other (See Comments)    Reaction unknown  . Tomato Itching    OBJECTIVE: Vitals:   12/22/18 1604 12/22/18 2014 12/23/18 0458 12/23/18 0500  BP: 111/70 (!) 113/53 103/63   Pulse: 68 74 72   Resp: 18 16 18    Temp: 98.5 F (36.9 C) 99.5 F (37.5 C) 98.7 F (37.1 C)   TempSrc: Oral Oral Oral   SpO2: 100% 100% 100%   Weight:    50.5 kg  Height:       Body mass index is 19.72 kg/m.  Physical Exam Constitutional:      Comments: She remains very weak and frail.  She is resting quietly in bed.  Her daughter, [name], is visiting.  Cardiovascular:     Rate and Rhythm: Normal rate and regular rhythm.     Heart sounds: No murmur.  Pulmonary:     Effort: Pulmonary effort is normal.     Breath sounds: Normal breath sounds.  Abdominal:     Palpations: Abdomen is soft.     Tenderness: There is no abdominal tenderness.     Lab Results Lab Results  Component Value Date   WBC 1.8 (L) 12/23/2018   HGB 10.5 (L) 12/23/2018   HCT 30.5 (L) 12/23/2018   MCV 90.8 12/23/2018   PLT 101 (L) 12/23/2018    Lab Results  Component Value  Date   CREATININE 0.67 12/23/2018   BUN 9 12/23/2018   NA 134 (L) 12/23/2018   K 3.4 (L) 12/23/2018   CL 114 (H) 12/23/2018   CO2 16 (L) 12/23/2018    Lab Results  Component Value Date   ALT 141 (H) 12/23/2018   AST 311 (H) 12/23/2018   ALKPHOS 86 12/23/2018   BILITOT 0.5 12/23/2018     Microbiology: Recent Results (from the past 240 hour(s))  Urine culture     Status: Abnormal   Collection Time: 12/15/18  8:11 AM   Specimen: Urine, Random  Result Value Ref Range Status   Specimen Description URINE, RANDOM  Final   Special Requests NONE  Final   Culture (A)  Final    <10,000 COLONIES/mL INSIGNIFICANT GROWTH Performed at [facility], [address], [place], [place] [address]    Report Status 12/16/2018 FINAL  Final  SARS Coronavirus 2 [facility] order, Performed in [facility] hospital lab) Nasopharyngeal  Nasopharyngeal Swab     Status: None   Collection Time: 12/15/18  8:11 AM   Specimen: Nasopharyngeal Swab  Result Value Ref Range Status   SARS Coronavirus 2 NEGATIVE NEGATIVE Final    Comment: (NOTE) If result is NEGATIVE SARS-CoV-2 target nucleic acids are NOT DETECTED. The SARS-CoV-2 RNA is generally detectable in upper and lower  respiratory specimens during the acute phase of infection. The lowest  concentration of SARS-CoV-2 viral copies this assay can detect is 250  copies / mL. A negative result does not preclude SARS-CoV-2 infection  and should not be used as the sole basis for treatment or other  patient management decisions.  A negative result may occur with  improper specimen collection / handling, submission of specimen other  than nasopharyngeal swab, presence of viral mutation(s) within the  areas targeted by this assay, and inadequate number of viral copies  (<250 copies / mL). A negative result must be combined with clinical  observations, patient history, and epidemiological information. If result is POSITIVE SARS-CoV-2 target nucleic acids are DETECTED. The SARS-CoV-2 RNA is generally detectable in upper and lower  respiratory specimens dur ing the acute phase of infection.  Positive  results are indicative of active infection with SARS-CoV-2.  Clinical  correlation with patient history and other diagnostic information is  necessary to determine patient infection status.  Positive results do  not rule out bacterial infection or co-infection with other viruses. If result is PRESUMPTIVE POSTIVE SARS-CoV-2 nucleic acids MAY BE PRESENT.   A presumptive positive result was obtained on the submitted specimen  and confirmed on repeat testing.  While 2019 novel coronavirus  (SARS-CoV-2) nucleic acids may be present in the submitted sample  additional confirmatory testing may be necessary for epidemiological  and / or clinical management purposes  to differentiate between   SARS-CoV-2 and other Sarbecovirus currently known to infect humans.  If clinically indicated additional testing with an alternate test  methodology [phone]) is advised. The SARS-CoV-2 RNA is generally  detectable in upper and lower respiratory sp ecimens during the acute  phase of infection. The expected result is Negative. Fact Sheet for Patients:  StrictlyIdeas.no Fact Sheet for Healthcare Providers: BankingDealers.co.za This test is not yet approved or cleared by the [place] FDA and has been authorized for detection and/or diagnosis of SARS-CoV-2 by FDA under an Emergency Use Authorization (EUA).  This EUA will remain in effect (meaning this test can be used) for the duration of the COVID-19 declaration under Section  564(b)(1) of the Act, 21 U.S.C. section 360bbb-3(b)(1), unless the authorization is terminated or revoked sooner. Performed at [facility], [place] [address], [place], [place] [address]   Urine Culture     Status: Abnormal   Collection Time: 12/16/18  1:32 PM   Specimen: Urine, Random  Result Value Ref Range Status   Specimen Description URINE, RANDOM  Final   Special Requests   Final    NONE Performed at [facility], [place] [address], [place], [place] [address]    Culture >=100,000 COLONIES/mL KLEBSIELLA PNEUMONIAE (A)  Final   Report Status 12/20/2018 FINAL  Final   Organism ID, Bacteria KLEBSIELLA PNEUMONIAE (A)  Final      Susceptibility   Klebsiella pneumoniae - MIC*    AMPICILLIN RESISTANT Resistant     CEFAZOLIN <=4 SENSITIVE Sensitive     CEFTRIAXONE <=1 SENSITIVE Sensitive     CIPROFLOXACIN <=0.25 SENSITIVE Sensitive     GENTAMICIN <=1 SENSITIVE Sensitive     IMIPENEM <=0.25 SENSITIVE Sensitive     NITROFURANTOIN <=16 SENSITIVE Sensitive     TRIMETH/SULFA <=20 SENSITIVE Sensitive     AMPICILLIN/SULBACTAM 4 SENSITIVE Sensitive     PIP/TAZO <=4 SENSITIVE Sensitive     Extended ESBL  NEGATIVE Sensitive     * >=100,000 COLONIES/mL KLEBSIELLA PNEUMONIAE  Urine Culture     Status: Abnormal   Collection Time: 12/19/18  4:12 AM   Specimen: Urine, Clean Catch  Result Value Ref Range Status   Specimen Description URINE, CLEAN CATCH  Final   Special Requests NONE  Final   Culture (A)  Final    <10,000 COLONIES/mL INSIGNIFICANT GROWTH Performed at [facility], [place] [address], [place],  [address]    Report Status 12/21/2018 FINAL  Final  Blood culture (routine x 2)     Status: None (Preliminary result)   Collection Time: 12/20/18 12:43 AM   Specimen: BLOOD  Result Value Ref Range Status   Specimen Description BLOOD RIGHT ANTECUBITAL  Final   Special Requests   Final    BOTTLES DRAWN AEROBIC AND ANAEROBIC Blood Culture adequate volume   Culture   Final    NO GROWTH 2 DAYS Performed at [facility], [place] [address], [place], [place] [address]    Report Status PENDING  Incomplete  SARS CORONAVIRUS 2 (TAT 6-12 HRS) Nasal Swab Aptima Multi Swab     Status: None   Collection Time: 12/20/18  3:52 AM   Specimen: Aptima Multi Swab; Nasal Swab  Result Value Ref Range Status   SARS Coronavirus 2 NEGATIVE NEGATIVE Final    Comment: (NOTE) SARS-CoV-2 target nucleic acids are NOT DETECTED. The SARS-CoV-2 RNA is generally detectable in upper and lower respiratory specimens during the acute phase of infection. Negative results do not preclude SARS-CoV-2 infection, do not rule out co-infections with other pathogens, and should not be used as the sole basis for treatment or other patient management decisions. Negative results must be combined with clinical observations, patient history, and epidemiological information. The expected result is Negative. Fact Sheet for Patients: SugarRoll.be Fact Sheet for Healthcare Providers: https://www.woods-mathews.com/ This test is not yet approved or cleared by the [place] FDA  and  has been authorized for detection and/or diagnosis of SARS-CoV-2 by FDA under an Emergency Use Authorization (EUA). This EUA will remain  in effect (meaning this test can be used) for the duration of the COVID-19 declaration under Section 56 4(b)(1) of the Act, 21 U.S.C.  section 360bbb-3(b)(1), unless the authorization is terminated or revoked sooner. Performed at [facility], [place] [address] [place], [place] [address]     Michel Bickers, [facility] [phone] pager   [phone] cell 12/23/2018, 11:11 AM

## 2018-12-23 NOTE — Progress Notes (Signed)
Pt refused to take her due med, also NPO today for abdominal UTZ up to 1600.

## 2018-12-24 DIAGNOSIS — E871 Hypo-osmolality and hyponatremia: Secondary | ICD-10-CM | POA: Diagnosis not present

## 2018-12-24 DIAGNOSIS — Z008 Encounter for other general examination: Secondary | ICD-10-CM

## 2018-12-24 DIAGNOSIS — R770 Abnormality of albumin: Secondary | ICD-10-CM

## 2018-12-24 DIAGNOSIS — R945 Abnormal results of liver function studies: Secondary | ICD-10-CM | POA: Diagnosis not present

## 2018-12-24 DIAGNOSIS — R531 Weakness: Secondary | ICD-10-CM | POA: Diagnosis not present

## 2018-12-24 DIAGNOSIS — Z723 Lack of physical exercise: Secondary | ICD-10-CM

## 2018-12-24 DIAGNOSIS — E440 Moderate protein-calorie malnutrition: Secondary | ICD-10-CM | POA: Diagnosis not present

## 2018-12-24 DIAGNOSIS — D61818 Other pancytopenia: Secondary | ICD-10-CM | POA: Diagnosis not present

## 2018-12-24 DIAGNOSIS — R630 Anorexia: Secondary | ICD-10-CM

## 2018-12-24 DIAGNOSIS — F4321 Adjustment disorder with depressed mood: Secondary | ICD-10-CM

## 2018-12-24 LAB — COMPREHENSIVE METABOLIC PANEL
ALT: 145 U/L — ABNORMAL HIGH (ref 0–44)
AST: 327 U/L — ABNORMAL HIGH (ref 15–41)
Albumin: 1.9 g/dL — ABNORMAL LOW (ref 3.5–5.0)
Alkaline Phosphatase: 82 U/L (ref 38–126)
Anion gap: 5 (ref 5–15)
BUN: 10 mg/dL (ref 8–23)
CO2: 19 mmol/L — ABNORMAL LOW (ref 22–32)
Calcium: 7.8 mg/dL — ABNORMAL LOW (ref 8.9–10.3)
Chloride: 111 mmol/L (ref 98–111)
Creatinine, Ser: 0.69 mg/dL (ref 0.44–1.00)
GFR calc Af Amer: >60 mL/min (ref >60)
GFR calc non Af Amer: >60 mL/min (ref >60)
Glucose, Bld: 107 mg/dL — ABNORMAL HIGH (ref 70–99)
Potassium: 3 mmol/L — ABNORMAL LOW (ref 3.5–5.1)
Sodium: 135 mmol/L (ref 135–145)
Total Bilirubin: 0.6 mg/dL (ref 0.3–1.2)
Total Protein: 4.8 g/dL — ABNORMAL LOW (ref 6.5–8.1)

## 2018-12-24 LAB — CBC WITH DIFFERENTIAL/PLATELET
Abs Immature Granulocytes: 0.01 10*3/uL (ref 0.00–0.07)
Basophils Absolute: 0 10*3/uL (ref 0.0–0.1)
Basophils Relative: 1 %
Eosinophils Absolute: 0.1 10*3/uL (ref 0.0–0.5)
Eosinophils Relative: 4 %
HCT: 29.1 % — ABNORMAL LOW (ref 36.0–46.0)
Hemoglobin: 10.1 g/dL — ABNORMAL LOW (ref 12.0–15.0)
Immature Granulocytes: 1 %
Lymphocytes Relative: 23 %
Lymphs Abs: 0.4 10*3/uL — ABNORMAL LOW (ref 0.7–4.0)
MCH: 31.1 pg (ref 26.0–34.0)
MCHC: 34.7 g/dL (ref 30.0–36.0)
MCV: 89.5 fL (ref 80.0–100.0)
Monocytes Absolute: 0.2 10*3/uL (ref 0.1–1.0)
Monocytes Relative: 12 %
Neutro Abs: 1 10*3/uL — ABNORMAL LOW (ref 1.7–7.7)
Neutrophils Relative %: 59 %
Platelets: 94 10*3/uL — ABNORMAL LOW (ref 150–400)
RBC: 3.25 MIL/uL — ABNORMAL LOW (ref 3.87–5.11)
RDW: 14.4 % (ref 11.5–15.5)
WBC: 1.6 10*3/uL — ABNORMAL LOW (ref 4.0–10.5)
nRBC: 0 % (ref 0.0–0.2)

## 2018-12-24 LAB — GLUCOSE, CAPILLARY: Glucose-Capillary: 97 mg/dL (ref 70–99)

## 2018-12-24 LAB — VITAMIN D 25 HYDROXY (VIT D DEFICIENCY, FRACTURES): Vit D, 25-Hydroxy: 34.2 ng/mL (ref 30.0–100.0)

## 2018-12-24 LAB — MAGNESIUM: Magnesium: 1.7 mg/dL (ref 1.7–2.4)

## 2018-12-24 MED ORDER — WHITE PETROLATUM EX OINT
TOPICAL_OINTMENT | CUTANEOUS | Status: AC
  Administered 2018-12-24: 0.2
  Filled 2018-12-24: qty 28.35

## 2018-12-24 MED ORDER — NYSTATIN 100000 UNIT/ML MT SUSP
5.0000 mL | Freq: Four times a day (QID) | OROMUCOSAL | Status: DC
  Administered 2018-12-24 – 2019-01-12 (×22): 500000 [IU] via ORAL
  Filled 2018-12-24 (×40): qty 5

## 2018-12-24 MED ORDER — MEGESTROL ACETATE 400 MG/10ML PO SUSP
400.0000 mg | Freq: Every day | ORAL | Status: DC
  Administered 2018-12-28: 11:00:00 400 mg via ORAL
  Filled 2018-12-24 (×11): qty 10

## 2018-12-24 NOTE — Progress Notes (Signed)
Patient ID: Sydney [name], female   DOB: 01-07-1931, 85 y.o.   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  CT of the abdomen and pelvis was negative for acute intra-abdominal or pelvic abnormality.  She was found to have mildly elevated LFTs along with hyponatremia and pancytopenia.  She was given IV fluids.  ID and GI were consulted.  Assessment & Plan:   Generalized weakness Moderate protein calorie malnutrition/failure to thrive Hypoalbuminemia -Questionable cause. -There might be a component of depression as well leading to very poor oral intake.  Discussed the same with [name].  She had initially wanted to discuss with PCP before starting antidepressant.  Yesterday she requested psychiatric evaluation.  Psychiatry consultation is pending. -CT of the head was negative for acute intracranial abnormality. -MRI of the brain showed no evidence of acute stroke.  She might need outpatient neurology evaluation. -No evidence of UTI.  Monitor off antibiotics. -PT/OT evaluation.  Patient looks very deconditioned and might benefit from SNF placement. -Oral intake is still poor.  Continue current IV fluids.  GI has started Megace.  Encourage patient to increase oral intake. -Patient is currently complaining of painful swallowing. -Follow nutrition consult recommendations -Overall prognosis is guarded to poor.  Palliative care consultation is pending.  Odynophagia with concern for probable thrush -Cannot use Diflucan because of increased LFTs.  Will try nystatin orally.  If symptoms do not improve, patient might need endoscopic  evaluation.  We will follow-up with GI.  Elevated LFTs -LFTs still elevated.  Abdominal exam is benign.  GI following.  Hepatitis panel negative.  EBV and CMV IgM are negative.  Right upper quadrant ultrasound only showed gallbladder sludging. -Monitor LFTs. -CT of the abdomen with no evidence of acute intra-abdominal or pelvic abnormality but with diffuse fluid within the colon suggestive of possible enteritis or diarrheal illness  Pancytopenia -Questionable cause.  Monitor.  Hematology evaluation appreciated.  Patient has been started on vitamin B12 parenterally as she had low levels of vitamin B12 during last admission but vitamin B12 level normal this admission.  No improvement with empiric vitamin B12 supplementation. -Outpatient follow-up with hematology.  No plans for bone marrow biopsy during this admission.  Hyponatremia -Sodium 128 in the ED.  135 today.  IV fluids as above  Hypokalemia -Replace.  Repeat a.m. labs  History of TIA -continue aspirin and Plavix  Stage II sacral pressure injury: Present on admission -Wound care as per wound care consult recommendations.    DVT prophylaxis: SCDs Code Status: Full Family Communication: Spoke to daughter on phone on 12/24/2018  disposition Plan: Home versus SNF in 1 to 2 days if clinically improves  Consultants: ID/GI/hematology/palliative care  Procedures: None  Antimicrobials: None   Subjective: Patient seen and examined at bedside.  She is awake, but a very poor historian.  Her appetite is still very poor.  Complains of intermittent painful swallowing.  No overnight fever or vomiting.   Objective: Vitals:   12/23/18 1434 12/23/18 2159 12/24/18 0500 12/24/18 0609  BP: (!) 106/59 118/73  (!) 100/50  Pulse: 69 81  78  Resp: '20 18  18  ' Temp: 99.1 F (37.3 C) 98 F (36.7 C)  99.8 F (37.7 C)  TempSrc: Oral Oral  Oral  SpO2: 100% 100%  100%  Weight:   50.3 kg   Height:        Intake/Output Summary (Last 24 hours)  at 12/24/2018 1140 Last data filed at 12/24/2018 1004 Gross per 24 hour  Intake 1469.52 ml  Output 525 ml  Net 944.52 ml   Filed Weights   12/22/18 0618 12/23/18 0500 12/24/18 0500  Weight: 49 kg 50.5 kg 50.3 kg    Examination:  General exam: No distress.  Looks older than stated age.  Very thinly built.  Very poor historian.  Does not like to communicate much. Respiratory system: Bilateral decreased breath sounds at bases with some scattered crackles  cardiovascular system: Rate controlled, S1-S2 heard Gastrointestinal system: Abdomen is nondistended, soft and nontender. Normal bowel sounds heard. Extremities: No cyanosis, edema   Data Reviewed: I have personally reviewed following labs and imaging studies  CBC: Recent Labs  Lab 12/20/18 0012 12/20/18 0554 12/21/18 0228 12/22/18 0301 12/23/18 0554 12/24/18 0154  WBC 3.0* 1.9* 1.6* 1.9* 1.8* 1.6*  NEUTROABS 2.1 1.4*  --  1.4* 1.2* 1.0*  HGB 11.1* 10.0* 10.0* 10.5* 10.5* 10.1*  HCT 33.7* 29.6* 29.5* 31.3* 30.5* 29.1*  MCV 94.7 94.0 92.2 92.6 90.8 89.5  PLT 113* 114* 111* 101* 101* 94*   Basic Metabolic Panel: Recent Labs  Lab 12/18/18 0524  12/20/18 0554 12/21/18 0228 12/22/18 0301 12/23/18 0554 12/24/18 0154  NA 132*   < > 132* 132* 134* 134* 135  K 3.7   < > 3.4* 3.2* 3.6 3.4* 3.0*  CL 111   < > 109 112* 114* 114* 111  CO2 16*   < > 17* 14* 15* 16* 19*  GLUCOSE 69*   < > 72 77 81 98 107*  BUN 9   < > '10 11 13 9 10  ' CREATININE 0.86   < > 0.81 0.70 0.70 0.67 0.69  CALCIUM 8.1*   < > 7.8* 7.8* 8.2* 7.9* 7.8*  MG 1.7  --   --  1.7 1.8 1.8 1.7   < > = values in this interval not displayed.   GFR: Estimated Creatinine Clearance: 40.1 mL/min (by C-G formula based on SCr of 0.69 mg/dL). Liver Function Tests: Recent Labs  Lab 12/20/18 0554 12/21/18 0228 12/22/18 0301 12/23/18 0554 12/24/18 0154  AST 234* 229* 287* 311* 327*  ALT 119* 114* 136* 141* 145*  ALKPHOS 75 71 83 86 82  BILITOT 0.6 0.7 0.5 0.5 0.6   PROT 4.7* 4.8* 4.9* 4.8* 4.8*  ALBUMIN 2.1* 1.9* 2.1* 1.9* 1.9*   Recent Labs  Lab 12/20/18 0012  LIPASE 123*   No results for input(s): AMMONIA in the last 168 hours. Coagulation Profile: No results for input(s): INR, PROTIME in the last 168 hours. Cardiac Enzymes: No results for input(s): CKTOTAL, CKMB, CKMBINDEX, TROPONINI in the last 168 hours. BNP (last 3 results) No results for input(s): PROBNP in the last 8760 hours. HbA1C: No results for input(s): HGBA1C in the last 72 hours. CBG: Recent Labs  Lab 12/21/18 0818 12/21/18 1155 12/22/18 0823 12/23/18 0854 12/24/18 0749  GLUCAP 76 82 71 93 97   Lipid Profile: No results for input(s): CHOL, HDL, LDLCALC, TRIG, CHOLHDL, LDLDIRECT in the last 72 hours. Thyroid Function Tests: No results for input(s): TSH, T4TOTAL, FREET4, T3FREE, THYROIDAB in the last 72 hours. Anemia Panel: No results for input(s): VITAMINB12, FOLATE, FERRITIN, TIBC, IRON, RETICCTPCT in the last 72 hours. Sepsis  Labs: Recent Labs  Lab 12/20/18 0012  LATICACIDVEN 1.4    Recent Results (from the past 240 hour(s))  Urine culture     Status: Abnormal   Collection Time: 12/15/18  8:11 AM   Specimen: Urine, Random  Result Value Ref Range Status   Specimen Description URINE, RANDOM  Final   Special Requests NONE  Final   Culture (A)  Final    <10,000 COLONIES/mL INSIGNIFICANT GROWTH Performed at [facility], [address], [place], [place] [address]    Report Status 12/16/2018 FINAL  Final  SARS Coronavirus 2 [facility] order, Performed in [facility] hospital lab) Nasopharyngeal Nasopharyngeal Swab     Status: None   Collection Time: 12/15/18  8:11 AM   Specimen: Nasopharyngeal Swab  Result Value Ref Range Status   SARS Coronavirus 2 NEGATIVE NEGATIVE Final    Comment: (NOTE) If result is NEGATIVE SARS-CoV-2 target nucleic acids are NOT DETECTED. The SARS-CoV-2 RNA is generally detectable in upper and lower  respiratory specimens  during the acute phase of infection. The lowest  concentration of SARS-CoV-2 viral copies this assay can detect is 250  copies / mL. A negative result does not preclude SARS-CoV-2 infection  and should not be used as the sole basis for treatment or other  patient management decisions.  A negative result may occur with  improper specimen collection / handling, submission of specimen other  than nasopharyngeal swab, presence of viral mutation(s) within the  areas targeted by this assay, and inadequate number of viral copies  (<250 copies / mL). A negative result must be combined with clinical  observations, patient history, and epidemiological information. If result is POSITIVE SARS-CoV-2 target nucleic acids are DETECTED. The SARS-CoV-2 RNA is generally detectable in upper and lower  respiratory specimens dur ing the acute phase of infection.  Positive  results are indicative of active infection with SARS-CoV-2.  Clinical  correlation with patient history and other diagnostic information is  necessary to determine patient infection status.  Positive results do  not rule out bacterial infection or co-infection with other viruses. If result is PRESUMPTIVE POSTIVE SARS-CoV-2 nucleic acids MAY BE PRESENT.   A presumptive positive result was obtained on the submitted specimen  and confirmed on repeat testing.  While 2019 novel coronavirus  (SARS-CoV-2) nucleic acids may be present in the submitted sample  additional confirmatory testing may be necessary for epidemiological  and / or clinical management purposes  to differentiate between  SARS-CoV-2 and other Sarbecovirus currently known to infect humans.  If clinically indicated additional testing with an alternate test  methodology [phone]) is advised. The SARS-CoV-2 RNA is generally  detectable in upper and lower respiratory sp ecimens during the acute  phase of infection. The expected result is Negative. Fact Sheet for Patients:   StrictlyIdeas.no Fact Sheet for Healthcare Providers: BankingDealers.co.za This test is not yet approved or cleared by the [place] FDA and has been authorized for detection and/or diagnosis of SARS-CoV-2 by FDA under an Emergency Use Authorization (EUA).  This EUA will remain in effect (meaning this test can be used) for the duration of the COVID-19 declaration under Section 564(b)(1) of the Act, 21 U.S.C. section 360bbb-3(b)(1), unless the authorization is terminated or revoked sooner. Performed at [facility], [place] [address], [place], [place] [address]   Urine Culture     Status: Abnormal   Collection Time: 12/16/18  1:32 PM   Specimen: Urine, Random  Result Value Ref Range Status  Specimen Description URINE, RANDOM  Final   Special Requests   Final    NONE Performed at [facility], [place] [address], [place], [place] [address]    Culture >=100,000 COLONIES/mL KLEBSIELLA PNEUMONIAE (A)  Final   Report Status 12/20/2018 FINAL  Final   Organism ID, Bacteria KLEBSIELLA PNEUMONIAE (A)  Final      Susceptibility   Klebsiella pneumoniae - MIC*    AMPICILLIN RESISTANT Resistant     CEFAZOLIN <=4 SENSITIVE Sensitive     CEFTRIAXONE <=1 SENSITIVE Sensitive     CIPROFLOXACIN <=0.25 SENSITIVE Sensitive     GENTAMICIN <=1 SENSITIVE Sensitive     IMIPENEM <=0.25 SENSITIVE Sensitive     NITROFURANTOIN <=16 SENSITIVE Sensitive     TRIMETH/SULFA <=20 SENSITIVE Sensitive     AMPICILLIN/SULBACTAM 4 SENSITIVE Sensitive     PIP/TAZO <=4 SENSITIVE Sensitive     Extended ESBL NEGATIVE Sensitive     * >=100,000 COLONIES/mL KLEBSIELLA PNEUMONIAE  Urine Culture     Status: Abnormal   Collection Time: 12/19/18  4:12 AM   Specimen: Urine, Clean Catch  Result Value Ref Range Status   Specimen Description URINE, CLEAN CATCH  Final   Special Requests NONE  Final   Culture (A)  Final    <10,000 COLONIES/mL INSIGNIFICANT GROWTH  Performed at [facility], [place] [address] [place], [place] [address]    Report Status 12/21/2018 FINAL  Final  Blood culture (routine x 2)     Status: None (Preliminary result)   Collection Time: 12/20/18 12:12 AM   Specimen: BLOOD RIGHT FOREARM  Result Value Ref Range Status   Specimen Description BLOOD RIGHT FOREARM  Final   Special Requests   Final    BOTTLES DRAWN AEROBIC AND ANAEROBIC Blood Culture adequate volume   Culture   Final    NO GROWTH 3 DAYS Performed at [facility], [place] [address] [place], [place] [address]    Report Status PENDING  Incomplete  Blood culture (routine x 2)     Status: None (Preliminary result)   Collection Time: 12/20/18 12:43 AM   Specimen: BLOOD  Result Value Ref Range Status   Specimen Description BLOOD RIGHT ANTECUBITAL  Final   Special Requests   Final    BOTTLES DRAWN AEROBIC AND ANAEROBIC Blood Culture adequate volume   Culture   Final    NO GROWTH 3 DAYS Performed at [facility], [place] [address] [place], [place] [address]    Report Status PENDING  Incomplete  SARS CORONAVIRUS 2 (TAT 6-12 HRS) Nasal Swab Aptima Multi Swab     Status: None   Collection Time: 12/20/18  3:52 AM   Specimen: Aptima Multi Swab; Nasal Swab  Result Value Ref Range Status   SARS Coronavirus 2 NEGATIVE NEGATIVE Final    Comment: (NOTE) SARS-CoV-2 target nucleic acids are NOT DETECTED. The SARS-CoV-2 RNA is generally detectable in upper and lower respiratory specimens during the acute phase of infection. Negative results do not preclude SARS-CoV-2 infection, do not rule out co-infections with other pathogens, and should not be used as the sole basis for treatment or other patient management decisions. Negative results must be combined with clinical observations, patient history, and epidemiological information. The expected result is Negative. Fact Sheet for Patients: SugarRoll.be Fact Sheet for Healthcare  Providers: https://www.woods-mathews.com/ This test is not yet approved or cleared by the [place] FDA and  has been authorized for detection and/or diagnosis of SARS-CoV-2 by FDA  under an Emergency Use Authorization (EUA). This EUA will remain  in effect (meaning this test can be used) for the duration of the COVID-19 declaration under Section 56 4(b)(1) of the Act, 21 U.S.C. section 360bbb-3(b)(1), unless the authorization is terminated or revoked sooner. Performed at [facility], [place] [address], [place], [place] [address]          Radiology Studies: US Abdomen Limited Ruq  Result Date: 12/23/2018 CLINICAL DATA:  Elevated LFTs. EXAM: ULTRASOUND ABDOMEN LIMITED RIGHT UPPER QUADRANT COMPARISON:  CT abdomen pelvis dated December 20, 2018. FINDINGS: Gallbladder: Sludge. No gallstones or wall thickening visualized. No sonographic Murphy sign noted by sonographer. Common bile duct: Diameter: 4 mm, normal. Liver: No suspicious focal lesion identified. Unchanged pericentimeter simple cyst in the central liver. Within normal limits in parenchymal echogenicity. Portal vein is patent on color Doppler imaging with normal direction of blood flow towards the liver. Other: Right pleural effusion. IMPRESSION: 1. No acute abnormality. 2. Gallbladder sludge. 3. Right pleural effusion. Electronically Signed   By: Titus Dubin M.D.   On: 12/23/2018 16:57        Scheduled Meds: . aspirin EC  81 mg Oral Daily  . clopidogrel  75 mg Oral Daily  . feeding supplement  1 Container Oral TID BM  . feeding supplement (PRO-STAT SUGAR FREE 64)  30 mL Oral BID WC  . folic acid  1 mg Oral Daily  . Gerhardt's butt cream   Topical TID  . megestrol  40 mg Oral Daily  . multivitamin with minerals  1 tablet Oral Daily  . nystatin  5 mL Oral QID  . pantoprazole  20 mg Oral Daily  . sodium chloride flush  3 mL Intravenous Q12H  . sodium chloride flush  3 mL Intravenous Q12H   Continuous  Infusions: . sodium chloride    . sodium bicarbonate 150 mEq in dextrose 5% 1000 mL 150 mEq (12/24/18 0130)     LOS: 4 days        Aline August, MD [facility] 12/24/2018, 11:40 AM

## 2018-12-24 NOTE — Progress Notes (Signed)
    Progress Note   Subjective  Complaining of sore throat. Daughter at bedside.   Objective  Vital signs in last 24 hours: Temp:  [98 F (36.7 C)-99.8 F (37.7 C)] 98.7 F (37.1 C) (08/29 1318) Pulse Rate:  [66-81] 66 (08/29 1318) Resp:  [18] 18 (08/29 1318) BP: (100-118)/(50-73) 105/57 (08/29 1318) SpO2:  [99 %-100 %] 99 % (08/29 1318) Weight:  [50.3 kg] 50.3 kg (08/29 0500) Last BM Date: 12/21/18  General: Alert, well-developed, thin, elderly, in NAD Heart:  Regular rate and rhythm; no murmurs HEENT: white coating on tongue, unable to adequately visualize pharynx  Chest: Clear to ascultation bilaterally Abdomen:  Soft, nontender and nondistended. Normal bowel sounds, without guarding, and without rebound.   Extremities:  Without edema. Neurologic:  Alert and  oriented x4; grossly normal neurologically. Psych:  Alert and cooperative. Normal mood and affect.  Intake/Output from previous day: 08/28 0701 - 08/29 0700 In: 1472.5 [P.O.:120; I.V.:1352.5] Out: 525 [Urine:525] Intake/Output this shift: No intake/output data recorded.  Lab Results: Recent Labs    12/22/18 0301 12/23/18 0554 12/24/18 0154  WBC 1.9* 1.8* 1.6*  HGB 10.5* 10.5* 10.1*  HCT 31.3* 30.5* 29.1*  PLT 101* 101* 94*   BMET Recent Labs    12/22/18 0301 12/23/18 0554 12/24/18 0154  NA 134* 134* 135  K 3.6 3.4* 3.0*  CL 114* 114* 111  CO2 15* 16* 19*  GLUCOSE 81 98 107*  BUN 13 9 10   CREATININE 0.70 0.67 0.69  CALCIUM 8.2* 7.9* 7.8*   LFT Recent Labs    12/24/18 0154  PROT 4.8*  ALBUMIN 1.9*  AST 327*  ALT 145*  ALKPHOS 82  BILITOT 0.6    Studies/Results: US Abdomen Limited Ruq  Result Date: 12/23/2018 CLINICAL DATA:  Elevated LFTs. EXAM: ULTRASOUND ABDOMEN LIMITED RIGHT UPPER QUADRANT COMPARISON:  CT abdomen pelvis dated December 20, 2018. FINDINGS: Gallbladder: Sludge. No gallstones or wall thickening visualized. No sonographic Murphy sign noted by sonographer. Common bile duct:  Diameter: 4 mm, normal. Liver: No suspicious focal lesion identified. Unchanged pericentimeter simple cyst in the central liver. Within normal limits in parenchymal echogenicity. Portal vein is patent on color Doppler imaging with normal direction of blood flow towards the liver. Other: Right pleural effusion. IMPRESSION: 1. No acute abnormality. 2. Gallbladder sludge. 3. Right pleural effusion. Electronically Signed   By: Titus Dubin M.D.   On: 12/23/2018 16:57      Assessment & Recommendations   1. Elevated transaminases, etiology unclear. RUQ [place] with GB sludge. Hepatitis evaluation is negative to date and autoimmune markers are pending.   2. FTT, sore throat, suspected oral thrush. Nystatin and magic mouthwash started. Esophageal candidiasis is possible. Discussed option of EGD with the patient and daughter at length. They are considering but would like to avoid sedation, invasive procedures. Nystatin for 7-10 days would treat candida esophagitis.      LOS: 4 days   Sydney [name] Plan MD 12/24/2018, 3:43 PM

## 2018-12-24 NOTE — Progress Notes (Signed)
Hematology:  Patient was not seen, labs reviewed and notes reviewed CBC Latest Ref Rng & Units 12/24/2018 12/23/2018 12/22/2018  WBC 4.0 - 10.5 K/uL 1.6(L) 1.8(L) 1.9(L)  Hemoglobin 12.0 - 15.0 g/dL 10.1(L) 10.5(L) 10.5(L)  Hematocrit 36.0 - 46.0 % 29.1(L) 30.5(L) 31.3(L)  Platelets 150 - 400 K/uL 94(L) 101(L) 101(L)     Continued pancytopenia: Multifactorial secondary to poor nutrition as well as probable bone marrow suppression from another etiology. On extensive review we could not identify any particular cause. Given her 3 doses of vitamin B12 but has not shown yet any improvement. We are happy to follow her on an outpatient basis in a couple of weeks to recheck her blood counts and determine if a bone marrow biopsy is warranted. I had previously explained to the patient that bone marrow reserves tend to be very low in the advanced age group.  Ability to recover from an acute illness may take much longer than usual.  Failure to thrive: With generalized weakness and depression

## 2018-12-24 NOTE — Consult Note (Signed)
Palliative Care Consult Note  85 yo woman with a history of C. Diff Colitis and Stool Transplant 2018, who prior to March was an active and independent woman- she walked at the mall and cooked at her church. Following COVID she stopped her daily walks, lost critical social interaction and also experienced the death of her sister in 09/22/22. She was unable to see her sister at EOL and was not able to have a funeral. In general her energy levels started declining and she was becoming weaker-she told her daughter something was wrong at the she needed to go to the doctor. Her daughter says that 2 weeks ago she was eating normal, but I notice she had an admission in January of 2019 for dehydration and low blood pressure which is the same presenting complaint that started a series of admissions this month. She has also recently received antibiotics for possible UTI.  Additional hx provided by her daughter is that she has been complaining of a sore throat and that food does not taste right. She has also been having intermittent abdominal pain and there may be anticipatory pain and avoidance behaviors occurring.  On my evaluation Mrs. Dauer is extremely deconditioned and weak, she responds appropriately but keeps her eyes mostly closed, she allowed me to examine her throat using a tongue depressor- she has evidence of white exudates erythema in posterior pharynx and possibly some exudate on her tongue as well.   Major Issues: 1. Thrush, Oropharyngeal 2. Failure to Thrive 3. Anorexia, Poor Appetite 4. Malnutrition with Electrolyte abnormality and low Albumin 5. Complicated Grief 6. Severe deconditioning  Recommendations/Goals of Care: 1. Patient's [name] expects and intends on her mother making a full and complete recovery. She does not have an adequate explanation for her mother's decline. Her complete focus is on finding that answer- meanwhile I expressed to her how worried I am that her mother is  becoming less interactive, weak and malnourished.  2. She does not want anti-depressant medication started and does not feel this is psychological-I described the physical manifestations of depression and I recommended a trial of treatment- mirtazapine begins working quickly vs other meds that take weeks for any impact.  3. Treat her Ritta Slot- will need to consult with hospitalist and ID to decide on appropriate medication.  4. Given the explicit need to provide clear evidence that there is not a physical or GI related issue occurring we may need to consider endoscopy- the history is not at all clear, but I suspect she has been having pain with eating and swallowing- but may have kept that largely to her self.  5. At this time I do not believe a conversation about EOL would be productive- I did let [name] know I was concerned about getting her mom "back to where she was prior to this admission" and that some of her lab work was very concerning for serious illness.  6. Limited pain control options other than opioids- given liver and GI issues.  Will continue to support and follow- if medical diagnostics are complete and treatment options exhausted, we will need to discuss supportive care at home and have additional conversation on EOL planning.  Lane Hacker, DO Palliative Medicine  Time: 70 min Greater than 50%  of this time was spent counseling and coordinating care related to the above assessment and plan.

## 2018-12-24 NOTE — Progress Notes (Signed)
Offered pt something to drink. Pt continuously refusing. Repositioned pt.

## 2018-12-25 DIAGNOSIS — R531 Weakness: Secondary | ICD-10-CM | POA: Diagnosis not present

## 2018-12-25 DIAGNOSIS — E871 Hypo-osmolality and hyponatremia: Secondary | ICD-10-CM | POA: Diagnosis not present

## 2018-12-25 DIAGNOSIS — E440 Moderate protein-calorie malnutrition: Secondary | ICD-10-CM | POA: Diagnosis not present

## 2018-12-25 DIAGNOSIS — D61818 Other pancytopenia: Secondary | ICD-10-CM | POA: Diagnosis not present

## 2018-12-25 DIAGNOSIS — Z008 Encounter for other general examination: Secondary | ICD-10-CM | POA: Diagnosis not present

## 2018-12-25 LAB — CBC WITH DIFFERENTIAL/PLATELET
Abs Immature Granulocytes: 0.02 10*3/uL (ref 0.00–0.07)
Basophils Absolute: 0 10*3/uL (ref 0.0–0.1)
Basophils Relative: 0 %
Eosinophils Absolute: 0.1 10*3/uL (ref 0.0–0.5)
Eosinophils Relative: 5 %
HCT: 28.8 % — ABNORMAL LOW (ref 36.0–46.0)
Hemoglobin: 10.1 g/dL — ABNORMAL LOW (ref 12.0–15.0)
Immature Granulocytes: 1 %
Lymphocytes Relative: 18 %
Lymphs Abs: 0.3 10*3/uL — ABNORMAL LOW (ref 0.7–4.0)
MCH: 31.3 pg (ref 26.0–34.0)
MCHC: 35.1 g/dL (ref 30.0–36.0)
MCV: 89.2 fL (ref 80.0–100.0)
Monocytes Absolute: 0.2 10*3/uL (ref 0.1–1.0)
Monocytes Relative: 13 %
Neutro Abs: 1.1 10*3/uL — ABNORMAL LOW (ref 1.7–7.7)
Neutrophils Relative %: 63 %
Platelets: 90 10*3/uL — ABNORMAL LOW (ref 150–400)
RBC: 3.23 MIL/uL — ABNORMAL LOW (ref 3.87–5.11)
RDW: 13.9 % (ref 11.5–15.5)
WBC: 1.7 10*3/uL — ABNORMAL LOW (ref 4.0–10.5)
nRBC: 0 % (ref 0.0–0.2)

## 2018-12-25 LAB — COMPREHENSIVE METABOLIC PANEL
ALT: 153 U/L — ABNORMAL HIGH (ref 0–44)
AST: 356 U/L — ABNORMAL HIGH (ref 15–41)
Albumin: 1.9 g/dL — ABNORMAL LOW (ref 3.5–5.0)
Alkaline Phosphatase: 83 U/L (ref 38–126)
Anion gap: 6 (ref 5–15)
BUN: 8 mg/dL (ref 8–23)
CO2: 24 mmol/L (ref 22–32)
Calcium: 7.5 mg/dL — ABNORMAL LOW (ref 8.9–10.3)
Chloride: 104 mmol/L (ref 98–111)
Creatinine, Ser: 0.57 mg/dL (ref 0.44–1.00)
GFR calc Af Amer: >60 mL/min (ref >60)
GFR calc non Af Amer: >60 mL/min (ref >60)
Glucose, Bld: 107 mg/dL — ABNORMAL HIGH (ref 70–99)
Potassium: 2.7 mmol/L — CL (ref 3.5–5.1)
Sodium: 134 mmol/L — ABNORMAL LOW (ref 135–145)
Total Bilirubin: 0.7 mg/dL (ref 0.3–1.2)
Total Protein: 4.8 g/dL — ABNORMAL LOW (ref 6.5–8.1)

## 2018-12-25 LAB — CULTURE, BLOOD (ROUTINE X 2)
Culture: NO GROWTH
Culture: NO GROWTH
Special Requests: ADEQUATE
Special Requests: ADEQUATE

## 2018-12-25 LAB — MAGNESIUM: Magnesium: 1.6 mg/dL — ABNORMAL LOW (ref 1.7–2.4)

## 2018-12-25 LAB — GLUCOSE, CAPILLARY: Glucose-Capillary: 104 mg/dL — ABNORMAL HIGH (ref 70–99)

## 2018-12-25 MED ORDER — POTASSIUM CHLORIDE CRYS ER 20 MEQ PO TBCR
40.0000 meq | EXTENDED_RELEASE_TABLET | Freq: Two times a day (BID) | ORAL | Status: AC
  Filled 2018-12-25 (×2): qty 2

## 2018-12-25 MED ORDER — MAGNESIUM SULFATE 2 GM/50ML IV SOLN
2.0000 g | Freq: Once | INTRAVENOUS | Status: AC
  Administered 2018-12-25: 10:00:00 2 g via INTRAVENOUS
  Filled 2018-12-25: qty 50

## 2018-12-25 NOTE — Progress Notes (Signed)
Sydney [name], daughter of the patient requests that a wound nurse evaluate the patient's stage 2 pressure injury on her sacrum.  She states her mother seems to be having more pain than previously.

## 2018-12-25 NOTE — Progress Notes (Signed)
Per the patient's [name]) request, I again prepared the medications for administration in applesauce.  I informed her we had the magic mouthwash available that would possibly soothe the patient's throat in order to take the medications.  She agreed we should try that.  After administering the magic mouthwash, the patient refused to take anything else.  [name] told me to just give her some time to rest and we will try again.

## 2018-12-25 NOTE — Progress Notes (Signed)
According to night RN the patient takes her medications whole with applesauce.  I gathered the patient's ordered medications and and asked her if she would like to take the medicaitons with the orange sherbert dessert on her tray, she stated that's ok.  I prepared to give the patient her medications with the sherbert and the patient refused to take one bite.  I asked her if she would prefer applesauce and she said she doesn't want anything right now.  I told her the medications I had for her and she stated she would take them later.  Will make another attempt once her daughter arrives.  I am told she is more cooperative when the daughter is present.

## 2018-12-25 NOTE — Progress Notes (Signed)
Patient ID: Sydney [name], female   DOB: [birth year], 85 y.o.   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  CT of the abdomen and pelvis was negative for acute intra-abdominal or pelvic abnormality.  She was found to have mildly elevated LFTs along with hyponatremia and pancytopenia.  She was given IV fluids.  ID and GI were consulted.  Palliative care was subsequently consulted for goals of care discussion.  Assessment & Plan:   Generalized weakness Moderate protein calorie malnutrition/failure to thrive Hypoalbuminemia -Questionable cause. -There might be a component of depression as well leading to very poor oral intake.  Psychiatry evaluation appreciated.  Noted not interested in starting antidepressant at this time.  Outpatient follow-up with psychiatry. -CT of the head was negative for acute intracranial abnormality. -MRI of the brain showed no evidence of acute stroke.  She might need outpatient neurology evaluation. -No evidence of UTI.  Monitor off antibiotics. -PT/OT evaluation.  Patient looks very deconditioned and might benefit from SNF placement. -Oral intake is still poor.  DC IV fluids today and monitor.  GI has started Megace.  Encourage patient to increase oral intake. -Patient is currently stable complaining of painful swallowing. -Follow nutrition consult recommendations -Overall prognosis is guarded to poor.  Palliative care consultation evaluation appreciated.  Patient's daughter at this time wants aggressive treatment.  Odynophagia with possible oropharyngeal candidiasis -Cannot use Diflucan because of  increased LFTs.  Continue nystatin orally.  If symptoms do not improve, patient might need endoscopic evaluation.  We will follow-up with GI.  Elevated LFTs -Abdominal exam is benign.  GI following.  Hepatitis panel negative.  EBV and CMV IgM are negative.  Right upper quadrant ultrasound only showed gallbladder sludging. -LFTs still trending up slightly. -CT of the abdomen with no evidence of acute intra-abdominal or pelvic abnormality but with diffuse fluid within the colon suggestive of possible enteritis or diarrheal illness  Pancytopenia -Questionable cause.  Monitor.  Hematology evaluation appreciated.  Patient has been started on vitamin B12 parenterally as she had low levels of vitamin B12 during last admission but vitamin B12 level normal this admission.  No improvement with empiric vitamin B12 supplementation. -Outpatient follow-up with hematology.  No plans for bone marrow biopsy during this admission.  Hyponatremia -Sodium 128 in the ED.  134 today.  IV fluids as above  Hypokalemia -Replace.  Repeat a.m. labs  Hypomagnesemia-replace.  Repeat a.m. labs  History of TIA -continue aspirin and Plavix  Stage II sacral pressure injury: Present on admission -Wound care as per wound care consult recommendations.    DVT prophylaxis: SCDs Code Status: Full Family Communication: Spoke to daughter/Angela at bedside on 12/25/2018 disposition Plan: Home in 1 to 2 days if clinically improves  Consultants: ID/GI/hematology/palliative care  Procedures: None  Antimicrobials: None   Subjective: Patient seen and examined at bedside.  She is awake, hardly participates in any conversation.  Intermittently still has difficulty and painful swallowing.  No overnight fever or vomiting reported.   Objective: Vitals:   12/24/18 1318 12/24/18 1951 12/25/18 0419 12/25/18 0500  BP: (!) 105/57 117/70 124/65   Pulse: 66 81 96   Resp: '18 18 14   ' Temp: 98.7  F (37.1 C) 98.4 F (36.9 C) 99.5 F  (37.5 C)   TempSrc: Oral Oral Oral   SpO2: 99% 94% 95%   Weight:    50 kg  Height:        Intake/Output Summary (Last 24 hours) at 12/25/2018 0803 Last data filed at 12/25/2018 0400 Gross per 24 hour  Intake 1841.25 ml  Output 400 ml  Net 1441.25 ml   Filed Weights   12/23/18 0500 12/24/18 0500 12/25/18 0500  Weight: 50.5 kg 50.3 kg 50 kg    Examination:  General exam: No acute distress.  Looks older than stated age.  Very thinly built.  Poor historian.  Does not like to communicate much. Respiratory system: Bilateral decreased breath sounds at bases; no wheezing cardiovascular system: S1-S2 heard, rate controlled Gastrointestinal system: Abdomen is nondistended, soft and nontender. Normal bowel sounds heard. Extremities: No cyanosis, edema   Data Reviewed: I have personally reviewed following labs and imaging studies  CBC: Recent Labs  Lab 12/20/18 0554 12/21/18 0228 12/22/18 0301 12/23/18 0554 12/24/18 0154 12/25/18 0302  WBC 1.9* 1.6* 1.9* 1.8* 1.6* 1.7*  NEUTROABS 1.4*  --  1.4* 1.2* 1.0* 1.1*  HGB 10.0* 10.0* 10.5* 10.5* 10.1* 10.1*  HCT 29.6* 29.5* 31.3* 30.5* 29.1* 28.8*  MCV 94.0 92.2 92.6 90.8 89.5 89.2  PLT 114* 111* 101* 101* 94* 90*   Basic Metabolic Panel: Recent Labs  Lab 12/21/18 0228 12/22/18 0301 12/23/18 0554 12/24/18 0154 12/25/18 0302  NA 132* 134* 134* 135 134*  K 3.2* 3.6 3.4* 3.0* 2.7*  CL 112* 114* 114* 111 104  CO2 14* 15* 16* 19* 24  GLUCOSE 77 81 98 107* 107*  BUN '11 13 9 10 8  ' CREATININE 0.70 0.70 0.67 0.69 0.57  CALCIUM 7.8* 8.2* 7.9* 7.8* 7.5*  MG 1.7 1.8 1.8 1.7 1.6*   GFR: Estimated Creatinine Clearance: 39.8 mL/min (by C-G formula based on SCr of 0.57 mg/dL). Liver Function Tests: Recent Labs  Lab 12/21/18 0228 12/22/18 0301 12/23/18 0554 12/24/18 0154 12/25/18 0302  AST 229* 287* 311* 327* 356*  ALT 114* 136* 141* 145* 153*  ALKPHOS 71 83 86 82 83  BILITOT 0.7 0.5 0.5 0.6 0.7  PROT 4.8* 4.9* 4.8* 4.8* 4.8*   ALBUMIN 1.9* 2.1* 1.9* 1.9* 1.9*   Recent Labs  Lab 12/20/18 0012  LIPASE 123*   No results for input(s): AMMONIA in the last 168 hours. Coagulation Profile: No results for input(s): INR, PROTIME in the last 168 hours. Cardiac Enzymes: No results for input(s): CKTOTAL, CKMB, CKMBINDEX, TROPONINI in the last 168 hours. BNP (last 3 results) No results for input(s): PROBNP in the last 8760 hours. HbA1C: No results for input(s): HGBA1C in the last 72 hours. CBG: Recent Labs  Lab 12/21/18 0818 12/21/18 1155 12/22/18 0823 12/23/18 0854 12/24/18 0749  GLUCAP 76 82 71 93 97   Lipid Profile: No results for input(s): CHOL, HDL, LDLCALC, TRIG, CHOLHDL, LDLDIRECT in the last 72 hours. Thyroid Function Tests: No results for input(s): TSH, T4TOTAL, FREET4, T3FREE, THYROIDAB in the last 72 hours. Anemia Panel: No results for input(s): VITAMINB12, FOLATE, FERRITIN, TIBC, IRON, RETICCTPCT in the last 72 hours. Sepsis Labs: Recent Labs  Lab 12/20/18 0012  LATICACIDVEN 1.4    Recent Results (from the past 240 hour(s))  Urine culture     Status: Abnormal   Collection Time: 12/15/18  8:11 AM   Specimen: Urine, Random  Result Value Ref Range Status   Specimen Description URINE, RANDOM  Final   Special Requests NONE  Final   Culture (A)  Final    <10,000 COLONIES/mL INSIGNIFICANT GROWTH Performed at [facility], [place] [address], [place], [place] [address]    Report Status 12/16/2018 FINAL  Final  SARS Coronavirus 2 [facility] order, Performed in [facility] hospital lab) Nasopharyngeal Nasopharyngeal Swab     Status: None   Collection Time: 12/15/18  8:11 AM   Specimen: Nasopharyngeal Swab  Result Value Ref Range Status   SARS Coronavirus 2 NEGATIVE NEGATIVE Final    Comment: (NOTE) If result is NEGATIVE SARS-CoV-2 target nucleic acids are NOT DETECTED. The SARS-CoV-2 RNA is generally detectable in upper and lower  respiratory specimens during the acute phase of infection.  The lowest  concentration of SARS-CoV-2 viral copies this assay can detect is 250  copies / mL. A negative result does not preclude SARS-CoV-2 infection  and should not be used as the sole basis for treatment or other  patient management decisions.  A negative result may occur with  improper specimen collection / handling, submission of specimen other  than nasopharyngeal swab, presence of viral mutation(s) within the  areas targeted by this assay, and inadequate number of viral copies  (<250 copies / mL). A negative result must be combined with clinical  observations, patient history, and epidemiological information. If result is POSITIVE SARS-CoV-2 target nucleic acids are DETECTED. The SARS-CoV-2 RNA is generally detectable in upper and lower  respiratory specimens dur ing the acute phase of infection.  Positive  results are indicative of active infection with SARS-CoV-2.  Clinical  correlation with patient history and other diagnostic information is  necessary to determine patient infection status.  Positive results do  not rule out bacterial infection or co-infection with other viruses. If result is PRESUMPTIVE POSTIVE SARS-CoV-2 nucleic acids MAY BE PRESENT.   A presumptive positive result was obtained on the submitted specimen  and confirmed on repeat testing.  While 2019 novel coronavirus  (SARS-CoV-2) nucleic acids may be present in the submitted sample  additional confirmatory testing may be necessary for epidemiological  and / or clinical management purposes  to differentiate between  SARS-CoV-2 and other Sarbecovirus currently known to infect humans.  If clinically indicated additional testing with an alternate test  methodology [phone]) is advised. The SARS-CoV-2 RNA is generally  detectable in upper and lower respiratory sp ecimens during the acute  phase of infection. The expected result is Negative. Fact Sheet for Patients:  StrictlyIdeas.no  Fact Sheet for Healthcare Providers: BankingDealers.co.za This test is not yet approved or cleared by the [place] FDA and has been authorized for detection and/or diagnosis of SARS-CoV-2 by FDA under an Emergency Use Authorization (EUA).  This EUA will remain in effect (meaning this test can be used) for the duration of the COVID-19 declaration under Section 564(b)(1) of the Act, 21 U.S.C. section 360bbb-3(b)(1), unless the authorization is terminated or revoked sooner. Performed at [facility], [place] [address] [place], [place] [address]   Urine Culture     Status: Abnormal   Collection Time: 12/16/18  1:32 PM   Specimen: Urine, Random  Result Value Ref Range Status   Specimen Description URINE, RANDOM  Final   Special Requests   Final    NONE Performed at [facility], [place] [address] [place], [place] [address]    Culture >=100,000 COLONIES/mL KLEBSIELLA PNEUMONIAE (A)  Final   Report Status 12/20/2018 FINAL  Final   Organism ID,  Bacteria KLEBSIELLA PNEUMONIAE (A)  Final      Susceptibility   Klebsiella pneumoniae - MIC*    AMPICILLIN RESISTANT Resistant     CEFAZOLIN <=4 SENSITIVE Sensitive     CEFTRIAXONE <=1 SENSITIVE Sensitive     CIPROFLOXACIN <=0.25 SENSITIVE Sensitive     GENTAMICIN <=1 SENSITIVE Sensitive     IMIPENEM <=0.25 SENSITIVE Sensitive     NITROFURANTOIN <=16 SENSITIVE Sensitive     TRIMETH/SULFA <=20 SENSITIVE Sensitive     AMPICILLIN/SULBACTAM 4 SENSITIVE Sensitive     PIP/TAZO <=4 SENSITIVE Sensitive     Extended ESBL NEGATIVE Sensitive     * >=100,000 COLONIES/mL KLEBSIELLA PNEUMONIAE  Urine Culture     Status: Abnormal   Collection Time: 12/19/18  4:12 AM   Specimen: Urine, Clean Catch  Result Value Ref Range Status   Specimen Description URINE, CLEAN CATCH  Final   Special Requests NONE  Final   Culture (A)  Final    <10,000 COLONIES/mL INSIGNIFICANT GROWTH Performed at [facility], [address] [place], [place] [address]    Report Status 12/21/2018 FINAL  Final  Blood culture (routine x 2)     Status: None (Preliminary result)   Collection Time: 12/20/18 12:12 AM   Specimen: BLOOD RIGHT FOREARM  Result Value Ref Range Status   Specimen Description BLOOD RIGHT FOREARM  Final   Special Requests   Final    BOTTLES DRAWN AEROBIC AND ANAEROBIC Blood Culture adequate volume   Culture   Final    NO GROWTH 4 DAYS Performed at [facility], [place] [address], [place], [place] [address]    Report Status PENDING  Incomplete  Blood culture (routine x 2)     Status: None (Preliminary result)   Collection Time: 12/20/18 12:43 AM   Specimen: BLOOD  Result Value Ref Range Status   Specimen Description BLOOD RIGHT ANTECUBITAL  Final   Special Requests   Final    BOTTLES DRAWN AEROBIC AND ANAEROBIC Blood Culture adequate volume   Culture   Final    NO GROWTH 4 DAYS Performed at [facility], [place] [address], [place], [place] [address]    Report Status PENDING  Incomplete  SARS CORONAVIRUS 2 (TAT 6-12 HRS) Nasal Swab Aptima Multi Swab     Status: None   Collection Time: 12/20/18  3:52 AM   Specimen: Aptima Multi Swab; Nasal Swab  Result Value Ref Range Status   SARS Coronavirus 2 NEGATIVE NEGATIVE Final    Comment: (NOTE) SARS-CoV-2 target nucleic acids are NOT DETECTED. The SARS-CoV-2 RNA is generally detectable in upper and lower respiratory specimens during the acute phase of infection. Negative results do not preclude SARS-CoV-2 infection, do not rule out co-infections with other pathogens, and should not be used as the sole basis for treatment or other patient management decisions. Negative results must be combined with clinical observations, patient history, and epidemiological information. The expected result is Negative. Fact Sheet for Patients: SugarRoll.be Fact Sheet for Healthcare Providers: https://www.woods-mathews.com/  This test is not yet approved or cleared by the [place] FDA and  has been authorized for detection and/or diagnosis of SARS-CoV-2 by FDA under an Emergency Use Authorization (EUA). This EUA will remain  in effect (meaning this test can be used) for the duration of the COVID-19 declaration under Section 56 4(b)(1) of the Act, 21 U.S.C. section 360bbb-3(b)(1), unless the authorization is terminated or revoked sooner. Performed at [facility], [place]  [address], [place], [place] [address]          Radiology Studies: US Abdomen Limited Ruq  Result Date: 12/23/2018 CLINICAL DATA:  Elevated LFTs. EXAM: ULTRASOUND ABDOMEN LIMITED RIGHT UPPER QUADRANT COMPARISON:  CT abdomen pelvis dated December 20, 2018. FINDINGS: Gallbladder: Sludge. No gallstones or wall thickening visualized. No sonographic Murphy sign noted by sonographer. Common bile duct: Diameter: 4 mm, normal. Liver: No suspicious focal lesion identified. Unchanged pericentimeter simple cyst in the central liver. Within normal limits in parenchymal echogenicity. Portal vein is patent on color Doppler imaging with normal direction of blood flow towards the liver. Other: Right pleural effusion. IMPRESSION: 1. No acute abnormality. 2. Gallbladder sludge. 3. Right pleural effusion. Electronically Signed   By: Titus Dubin M.D.   On: 12/23/2018 16:57        Scheduled Meds: . aspirin EC  81 mg Oral Daily  . clopidogrel  75 mg Oral Daily  . feeding supplement  1 Container Oral TID BM  . feeding supplement (PRO-STAT SUGAR FREE 64)  30 mL Oral BID WC  . folic acid  1 mg Oral Daily  . Gerhardt's butt cream   Topical TID  . megestrol  400 mg Oral Daily  . multivitamin with minerals  1 tablet Oral Daily  . nystatin  5 mL Oral QID  . pantoprazole  20 mg Oral Daily  . potassium chloride  40 mEq Oral BID WC  . sodium chloride flush  3 mL Intravenous Q12H  . sodium chloride flush  3 mL Intravenous Q12H   Continuous Infusions:  . sodium chloride    . sodium bicarbonate 150 mEq in dextrose 5% 1000 mL 150 mEq (12/25/18 0335)     LOS: 5 days        Aline August, MD [facility] 12/25/2018, 8:03 AM

## 2018-12-25 NOTE — Progress Notes (Signed)
CRITICAL VALUE ALERT  Critical Value:  Potassium 2.7  Date & Time Notied:  12/25/2018, 0430  Provider Notified: X. Blount  Orders Received/Actions taken: Awaiting for orders

## 2018-12-25 NOTE — Plan of Care (Signed)
  Problem: Elimination: Goal: Will not experience complications related to bowel motility Outcome: Progressing Goal: Will not experience complications related to urinary retention Outcome: Progressing   Problem: Pain Managment: Goal: General experience of comfort will improve Outcome: Progressing   Problem: Safety: Goal: Ability to remain free from injury will improve Outcome: Progressing   

## 2018-12-25 NOTE — Progress Notes (Signed)
Subjective: Patient appears to be awake and alert today.  Because she was in a hurry to use commode, I did not completely examine her.  Vitals:   12/24/18 1951 12/25/18 0419  BP: 117/70 124/65  Pulse: 81 96  Resp: 18 14  Temp: 98.4 F (36.9 C) 99.5 F (37.5 C)  SpO2: 94% 95%    CBC Latest Ref Rng & Units 12/25/2018 12/24/2018 12/23/2018  WBC 4.0 - 10.5 K/uL 1.7(L) 1.6(L) 1.8(L)  Hemoglobin 12.0 - 15.0 g/dL 10.1(L) 10.1(L) 10.5(L)  Hematocrit 36.0 - 46.0 % 28.8(L) 29.1(L) 30.5(L)  Platelets 150 - 400 K/uL 90(L) 94(L) 101(L)    Pancytopenia: Relatively stable. No further hematology recommendations at this time. B12 injections did not make any market improvement. Outpatient hematology appointment is optional.  There is probably no further interventions that will be done.  Failure to thrive: Sudden onset previously she was fairly active lady.  Emotional/psychological issues are being evaluated.  We will sign off.

## 2018-12-26 DIAGNOSIS — E871 Hypo-osmolality and hyponatremia: Secondary | ICD-10-CM | POA: Diagnosis not present

## 2018-12-26 DIAGNOSIS — E440 Moderate protein-calorie malnutrition: Secondary | ICD-10-CM | POA: Diagnosis not present

## 2018-12-26 DIAGNOSIS — R945 Abnormal results of liver function studies: Secondary | ICD-10-CM | POA: Diagnosis not present

## 2018-12-26 DIAGNOSIS — D61818 Other pancytopenia: Secondary | ICD-10-CM | POA: Diagnosis not present

## 2018-12-26 DIAGNOSIS — R531 Weakness: Secondary | ICD-10-CM | POA: Diagnosis not present

## 2018-12-26 DIAGNOSIS — R1310 Dysphagia, unspecified: Secondary | ICD-10-CM

## 2018-12-26 LAB — CBC WITH DIFFERENTIAL/PLATELET
Abs Immature Granulocytes: 0.01 10*3/uL (ref 0.00–0.07)
Basophils Absolute: 0 10*3/uL (ref 0.0–0.1)
Basophils Relative: 0 %
Eosinophils Absolute: 0.1 10*3/uL (ref 0.0–0.5)
Eosinophils Relative: 5 %
HCT: 30.3 % — ABNORMAL LOW (ref 36.0–46.0)
Hemoglobin: 10.5 g/dL — ABNORMAL LOW (ref 12.0–15.0)
Immature Granulocytes: 1 %
Lymphocytes Relative: 23 %
Lymphs Abs: 0.3 10*3/uL — ABNORMAL LOW (ref 0.7–4.0)
MCH: 31.5 pg (ref 26.0–34.0)
MCHC: 34.7 g/dL (ref 30.0–36.0)
MCV: 91 fL (ref 80.0–100.0)
Monocytes Absolute: 0.2 10*3/uL (ref 0.1–1.0)
Monocytes Relative: 15 %
Neutro Abs: 0.8 10*3/uL — ABNORMAL LOW (ref 1.7–7.7)
Neutrophils Relative %: 56 %
Platelets: 80 10*3/uL — ABNORMAL LOW (ref 150–400)
RBC: 3.33 MIL/uL — ABNORMAL LOW (ref 3.87–5.11)
RDW: 14.6 % (ref 11.5–15.5)
WBC: 1.5 10*3/uL — ABNORMAL LOW (ref 4.0–10.5)
nRBC: 0 % (ref 0.0–0.2)

## 2018-12-26 LAB — COMPREHENSIVE METABOLIC PANEL
ALT: 153 U/L — ABNORMAL HIGH (ref 0–44)
AST: 386 U/L — ABNORMAL HIGH (ref 15–41)
Albumin: 1.8 g/dL — ABNORMAL LOW (ref 3.5–5.0)
Alkaline Phosphatase: 98 U/L (ref 38–126)
Anion gap: 6 (ref 5–15)
BUN: 10 mg/dL (ref 8–23)
CO2: 24 mmol/L (ref 22–32)
Calcium: 7.5 mg/dL — ABNORMAL LOW (ref 8.9–10.3)
Chloride: 104 mmol/L (ref 98–111)
Creatinine, Ser: 0.74 mg/dL (ref 0.44–1.00)
GFR calc Af Amer: >60 mL/min (ref >60)
GFR calc non Af Amer: >60 mL/min (ref >60)
Glucose, Bld: 77 mg/dL (ref 70–99)
Potassium: 2.7 mmol/L — CL (ref 3.5–5.1)
Sodium: 134 mmol/L — ABNORMAL LOW (ref 135–145)
Total Bilirubin: 0.5 mg/dL (ref 0.3–1.2)
Total Protein: 4.7 g/dL — ABNORMAL LOW (ref 6.5–8.1)

## 2018-12-26 LAB — GLUCOSE, CAPILLARY
Glucose-Capillary: 63 mg/dL — ABNORMAL LOW (ref 70–99)
Glucose-Capillary: 111 mg/dL — ABNORMAL HIGH (ref 70–99)

## 2018-12-26 LAB — ANTINUCLEAR ANTIBODIES, IFA: ANA Ab, IFA: NEGATIVE

## 2018-12-26 LAB — MITOCHONDRIAL ANTIBODIES: Mitochondrial M2 Ab, IgG: <20 Units (ref 0.0–20.0)

## 2018-12-26 LAB — PATHOLOGIST SMEAR REVIEW

## 2018-12-26 LAB — ANTI-SMOOTH MUSCLE ANTIBODY, IGG: F-Actin IgG: 14 Units (ref 0–19)

## 2018-12-26 LAB — MAGNESIUM: Magnesium: 2.3 mg/dL (ref 1.7–2.4)

## 2018-12-26 MED ORDER — POTASSIUM CHLORIDE 10 MEQ/100ML IV SOLN
10.0000 meq | INTRAVENOUS | Status: AC
  Administered 2018-12-26 (×4): 10 meq via INTRAVENOUS
  Filled 2018-12-26 (×4): qty 100

## 2018-12-26 MED ORDER — KCL IN DEXTROSE-NACL 40-5-0.9 MEQ/L-%-% IV SOLN
INTRAVENOUS | Status: DC
  Administered 2018-12-26 – 2018-12-31 (×9): via INTRAVENOUS
  Filled 2018-12-26 (×11): qty 1000

## 2018-12-26 MED ORDER — POTASSIUM CHLORIDE CRYS ER 20 MEQ PO TBCR: 40.0000 meq | EXTENDED_RELEASE_TABLET | ORAL | Status: DC

## 2018-12-26 MED ORDER — DEXTROSE 50 % IV SOLN
INTRAVENOUS | Status: AC
  Administered 2018-12-26: 09:00:00 25 mL
  Filled 2018-12-26: qty 50

## 2018-12-26 NOTE — Plan of Care (Signed)

## 2018-12-26 NOTE — Progress Notes (Signed)
CRITICAL VALUE ALERT  Critical Value:  Potassium-2.7  Date & Time Notied:  12/26/2018 at [place]  Provider Notified: X. Blount,NP  Orders Received/Actions taken: Awaiting for orders.

## 2018-12-26 NOTE — Progress Notes (Signed)
Patient ID: Sydney [name], female   DOB: 1930-06-20, 85 y.o.   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  CT of the abdomen and pelvis was negative for acute intra-abdominal or pelvic abnormality.  She was found to have mildly elevated LFTs along with hyponatremia and pancytopenia.  She was given IV fluids.  ID and GI were consulted.  Palliative care was subsequently consulted for goals of care discussion.  Assessment & Plan:   Generalized weakness Moderate protein calorie malnutrition/failure to thrive Hypoalbuminemia -Questionable cause. -There might be a component of depression as well leading to very poor oral intake.  Psychiatry evaluation appreciated.  Noted not interested in starting antidepressant at this time.  Outpatient follow-up with psychiatry. -CT of the head was negative for acute intracranial abnormality. -MRI of the brain showed no evidence of acute stroke.  She might need outpatient neurology evaluation. -No evidence of UTI.  Monitor off antibiotics. -PT/OT evaluation.  Patient looks very deconditioned  -Oral intake is still poor.  IV fluids were discontinued on 12/25/2018.  Continue Megace which was started by GI.   Encourage patient to increase oral intake. -Follow nutrition consult recommendations -Overall prognosis is guarded to poor.  Palliative care consultation evaluation appreciated.  Patient's daughter at this time wants aggressive treatment and is not interested in palliative care discussions.  Because of her continued very poor intake, unclear if patient will get better with current treatment  modalities. -If oral intake remains poor, might have to consider NG feeding versus TPN.  Odynophagia with possible oropharyngeal candidiasis -Cannot use Diflucan because of increased LFTs.  Continue nystatin orally.  If symptoms do not improve, patient might need endoscopic evaluation.  GI following.  Elevated LFTs -Abdominal exam is benign.  GI following.  Hepatitis panel negative.  EBV and CMV IgM are negative.  Right upper quadrant ultrasound only showed gallbladder sludging. -LFTs still trending up slightly. -CT of the abdomen with no evidence of acute intra-abdominal or pelvic abnormality but with diffuse fluid within the colon suggestive of possible enteritis or diarrheal illness  Pancytopenia -Questionable cause.  Monitor.  Hematology evaluation appreciated.  Patient has been started on vitamin B12 parenterally as she had low levels of vitamin B12 during last admission but vitamin B12 level normal this admission.  No improvement with empiric vitamin B12 supplementation. -Outpatient follow-up with hematology.  No plans for bone marrow biopsy during this admission.  Hyponatremia -Sodium 128 in the ED.  134 today.  Off IV fluids.  Hypokalemia -Replace.  Repeat a.m. labs  Hypomagnesemia-replace.  Repeat a.m. labs  History of TIA -continue aspirin and Plavix  Stage II sacral pressure injury: Present on admission -Wound care as per wound care consult recommendations.    DVT prophylaxis: SCDs Code Status: Full Family Communication: Spoke to daughter/Angela at bedside on 12/26/2018 disposition Plan: Home in 1 to 2 days if clinically improves  Consultants: ID/GI/hematology/palliative care  Procedures: None  Antimicrobials: None   Subjective: Patient seen and examined at bedside.  She is awake, hardly participates in any conversation.  Very poor historian.  Oral intake is still very poor.  No overnight fever or vomiting reported.   Objective: Vitals:   12/25/18  1413 12/25/18  1941 12/26/18 0500 12/26/18 0619  BP: 103/61 100/71  (!) 100/56  Pulse: 68 81  75  Resp: '15 18  18  ' Temp: 98.7 F (37.1 C) 98.2 F (36.8 C)  98.8 F (37.1 C)  TempSrc: Oral Oral  Oral  SpO2: 97% 98%  99%  Weight:   61 kg   Height:        Intake/Output Summary (Last 24 hours) at 12/26/2018 0815 Last data filed at 12/26/2018 0537 Gross per 24 hour  Intake 100 ml  Output --  Net 100 ml   Filed Weights   12/24/18 0500 12/25/18 0500 12/26/18 0500  Weight: 50.3 kg 50 kg 61 kg    Examination:  General exam: No distress.  Looks older than stated age.  Very thinly built.  Does not communicate much.  Very poor historian.   Respiratory system: Bilateral decreased breath sounds at bases with scattered crackles.   Cardiovascular system: Rate controlled, S1-S2 heard Gastrointestinal system: Abdomen is nondistended, soft and nontender. Normal bowel sounds heard. Extremities: No cyanosis, edema   Data Reviewed: I have personally reviewed following labs and imaging studies  CBC: Recent Labs  Lab 12/22/18 0301 12/23/18 0554 12/24/18 0154 12/25/18 0302 12/26/18 0323  WBC 1.9* 1.8* 1.6* 1.7* 1.5*  NEUTROABS 1.4* 1.2* 1.0* 1.1* 0.8*  HGB 10.5* 10.5* 10.1* 10.1* 10.5*  HCT 31.3* 30.5* 29.1* 28.8* 30.3*  MCV 92.6 90.8 89.5 89.2 91.0  PLT 101* 101* 94* 90* 80*   Basic Metabolic Panel: Recent Labs  Lab 12/22/18 0301 12/23/18 0554 12/24/18 0154 12/25/18 0302 12/26/18 0323  NA 134* 134* 135 134* 134*  K 3.6 3.4* 3.0* 2.7* 2.7*  CL 114* 114* 111 104 104  CO2 15* 16* 19* 24 24  GLUCOSE 81 98 107* 107* 77  BUN '13 9 10 8 10  ' CREATININE 0.70 0.67 0.69 0.57 0.74  CALCIUM 8.2* 7.9* 7.8* 7.5* 7.5*  MG 1.8 1.8 1.7 1.6* 2.3   GFR: Estimated Creatinine Clearance: 41.8 mL/min (by C-G formula based on SCr of 0.74 mg/dL). Liver Function Tests: Recent Labs  Lab 12/22/18 0301 12/23/18 0554 12/24/18 0154 12/25/18 0302 12/26/18 0323  AST 287* 311* 327* 356* 386*  ALT 136* 141* 145*  153* 153*  ALKPHOS 83 86 82 83 98  BILITOT 0.5 0.5 0.6 0.7 0.5  PROT 4.9* 4.8* 4.8* 4.8* 4.7*  ALBUMIN 2.1* 1.9* 1.9* 1.9* 1.8*   Recent Labs  Lab 12/20/18 0012  LIPASE 123*   No results for input(s): AMMONIA in the last 168 hours. Coagulation Profile: No results for input(s): INR, PROTIME in the last 168 hours. Cardiac Enzymes: No results for input(s): CKTOTAL, CKMB, CKMBINDEX, TROPONINI in the last 168 hours. BNP (last 3 results) No results for input(s): PROBNP in the last 8760 hours. HbA1C: No results for input(s): HGBA1C in the last 72 hours. CBG: Recent Labs  Lab 12/21/18 1155 12/22/18 0823 12/23/18 0854 12/24/18 0749 12/25/18 0747  GLUCAP 82 71 93 97 104*   Lipid Profile: No results for input(s): CHOL, HDL, LDLCALC, TRIG, CHOLHDL, LDLDIRECT in the last 72 hours. Thyroid Function Tests: No results for input(s): TSH, T4TOTAL, FREET4, T3FREE, THYROIDAB in the last 72 hours. Anemia Panel: No results for input(s): VITAMINB12, FOLATE, FERRITIN, TIBC, IRON, RETICCTPCT in the last 72 hours. Sepsis Labs: Recent Labs  Lab 12/20/18 0012  LATICACIDVEN 1.4    Recent Results (from the past 240 hour(s))  Urine Culture     Status: Abnormal   Collection Time: 12/16/18  1:32 PM   Specimen: Urine, Random  Result Value Ref Range Status   Specimen Description URINE, RANDOM  Final   Special Requests   Final    NONE Performed at [facility], [address], [place], [place] [address]    Culture >=100,000 COLONIES/mL KLEBSIELLA PNEUMONIAE (A)  Final   Report Status 12/20/2018 FINAL  Final   Organism ID, Bacteria KLEBSIELLA PNEUMONIAE (A)  Final      Susceptibility   Klebsiella pneumoniae - MIC*    AMPICILLIN RESISTANT Resistant     CEFAZOLIN <=4 SENSITIVE Sensitive     CEFTRIAXONE <=1 SENSITIVE Sensitive     CIPROFLOXACIN <=0.25 SENSITIVE Sensitive     GENTAMICIN <=1 SENSITIVE Sensitive     IMIPENEM <=0.25 SENSITIVE Sensitive     NITROFURANTOIN <=16 SENSITIVE  Sensitive     TRIMETH/SULFA <=20 SENSITIVE Sensitive     AMPICILLIN/SULBACTAM 4 SENSITIVE Sensitive     PIP/TAZO <=4 SENSITIVE Sensitive     Extended ESBL NEGATIVE Sensitive     * >=100,000 COLONIES/mL KLEBSIELLA PNEUMONIAE  Urine Culture     Status: Abnormal   Collection Time: 12/19/18  4:12 AM   Specimen: Urine, Clean Catch  Result Value Ref Range Status   Specimen Description URINE, CLEAN CATCH  Final   Special Requests NONE  Final   Culture (A)  Final    <10,000 COLONIES/mL INSIGNIFICANT GROWTH Performed at [facility], [place] [address] [place], [place] [address]    Report Status 12/21/2018 FINAL  Final  Blood culture (routine x 2)     Status: None   Collection Time: 12/20/18 12:12 AM   Specimen: BLOOD RIGHT FOREARM  Result Value Ref Range Status   Specimen Description BLOOD RIGHT FOREARM  Final   Special Requests   Final    BOTTLES DRAWN AEROBIC AND ANAEROBIC Blood Culture adequate volume   Culture   Final    NO GROWTH 5 DAYS Performed at [facility], [place] [address] [place], [place] [address]    Report Status 12/25/2018 FINAL  Final  Blood culture (routine x 2)     Status: None   Collection Time: 12/20/18 12:43 AM   Specimen: BLOOD  Result Value Ref Range Status   Specimen Description BLOOD RIGHT ANTECUBITAL  Final   Special Requests   Final    BOTTLES DRAWN AEROBIC AND ANAEROBIC Blood Culture adequate volume   Culture   Final    NO GROWTH 5 DAYS Performed at [facility], [place] [address], [place],  [address]    Report Status 12/25/2018 FINAL  Final  SARS CORONAVIRUS 2 (TAT 6-12 HRS) Nasal Swab Aptima Multi Swab     Status: None   Collection Time: 12/20/18  3:52 AM   Specimen: Aptima Multi Swab; Nasal Swab  Result Value Ref Range Status   SARS Coronavirus 2 NEGATIVE NEGATIVE Final    Comment: (NOTE) SARS-CoV-2 target nucleic acids are NOT DETECTED. The SARS-CoV-2 RNA is generally detectable in upper and lower respiratory specimens during  the acute phase of infection. Negative results do not preclude SARS-CoV-2 infection, do not rule out co-infections with other pathogens, and should not be used as the sole basis for treatment or other patient management decisions. Negative results must be combined with clinical observations, patient history, and epidemiological information. The expected result is Negative. Fact Sheet for Patients: SugarRoll.be Fact Sheet for Healthcare Providers: https://www.woods-mathews.com/ This test is not yet approved or cleared by the [place] FDA  and  has been authorized for detection and/or diagnosis of SARS-CoV-2 by FDA under an Emergency Use Authorization (EUA). This EUA will remain  in effect (meaning this test can be used) for the duration of the COVID-19 declaration under Section 56 4(b)(1) of the Act, 21 U.S.C. section 360bbb-3(b)(1), unless the authorization is terminated or revoked sooner. Performed at [facility], [place] [address] [place], [place] [address]          Radiology Studies: No results found.      Scheduled Meds:  aspirin EC  81 mg Oral Daily   clopidogrel  75 mg Oral Daily   feeding supplement  1 Container Oral TID BM   feeding supplement (PRO-STAT SUGAR FREE 64)  30 mL Oral BID WC   folic acid  1 mg Oral Daily   Gerhardt's butt cream   Topical TID   megestrol  400 mg Oral Daily   multivitamin with minerals  1 tablet Oral Daily   nystatin  5 mL Oral QID   pantoprazole  20 mg Oral Daily   potassium chloride  40 mEq Oral Q4H   sodium chloride flush  3 mL Intravenous Q12H   sodium chloride flush  3 mL Intravenous Q12H   Continuous Infusions:  sodium chloride     potassium chloride 10 mEq (12/26/18 0742)     LOS: 6 days        Aline August, MD [facility] 12/26/2018, 8:15 AM

## 2018-12-26 NOTE — Progress Notes (Signed)
Occupational Therapy Treatment Patient Details Name: Sydney [name] MRN: [id number] DOB: 05-Oct-1930 Today's Date: 12/26/2018    History of present illness VIERA [name] is a 85 y.o. 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   OT comments  Pt verbalizing agreement for OOB to chair and requesting to use potty, but did not initiate movement. Requiring max assist for bed mobility and moderate assistance to stand and pivot. Pt needing max assist to wash her face and to drink (only wanting sips of water). Updated d/c plan to SNF.  Follow Up Recommendations  SNF;Supervision/Assistance - 24 hour    Equipment Recommendations       Recommendations for Other Services      Precautions / Restrictions Precautions Precautions: Fall       Mobility Bed Mobility Overal bed mobility: Needs Assistance Bed Mobility: Supine to Sit     Supine to sit: Max assist     General bed mobility comments: decreased initiation, assist for all aspects, pulled up on therapist's hands to raise trunk  Transfers Overall transfer level: Needs assistance Equipment used: 1 person hand held assist Transfers: Sit to/from Stand;Stand Pivot Transfers Sit to Stand: Mod assist Stand pivot transfers: Mod assist       General transfer comment: assist to rise and steady    Balance Overall balance assessment: Needs assistance Sitting-balance support: Single extremity supported Sitting balance-Leahy Scale: Fair       Standing balance-Leahy Scale: Poor                             ADL either performed or assessed with clinical judgement   ADL Overall ADL's : Needs assistance/impaired Eating/Feeding: Total assistance;Sitting    Grooming: Maximal assistance;Wash/dry face;Sitting                   Armed forces technical officer: Moderate assistance;Stand-pivot;BSC   Toileting- Clothing Manipulation and Hygiene: Total assistance;Sit to/from stand               Vision       Perception     Praxis      Cognition Arousal/Alertness: Awake/alert Behavior During Therapy: Flat affect Overall Cognitive Status: Impaired/Different from baseline Area of Impairment: Attention;Following commands;Problem solving;Safety/judgement                   Current Attention Level: Focused   Following Commands: Follows one step commands inconsistently Safety/Judgement: Decreased awareness of safety;Decreased awareness of deficits   Problem Solving: Slow processing;Decreased initiation;Requires verbal cues;Requires tactile cues General Comments: pt stating she needed to use the potty, but then asking to return to bed        Exercises     Shoulder Instructions       General Comments      Pertinent Vitals/ Pain       Pain Assessment: Faces Faces Pain Scale: Hurts little more Pain Location: unknown Pain Descriptors / Indicators: Grimacing Pain Intervention(s): Monitored during session  Home Living                                          Prior Functioning/Environment  Frequency  Min 2X/week        Progress Toward Goals  OT Goals(current goals can now be found in the care plan section)  Progress towards OT goals: Not progressing toward goals - comment  Acute Rehab OT Goals Patient Stated Goal: none stated but daughter wants to find out what is causing her mother's weakness/ fatigue OT Goal Formulation: With patient Time For Goal Achievement: 12/27/18 Potential to Achieve Goals: [place] Discharge plan needs to be updated    Co-evaluation                 AM-PAC OT "6 Clicks" Daily Activity     Outcome Measure   Help from another person eating  meals?: Total Help from another person taking care of personal grooming?: A Lot Help from another person toileting, which includes using toliet, bedpan, or urinal?: Total Help from another person bathing (including washing, rinsing, drying)?: A Lot Help from another person to put on and taking off regular upper body clothing?: A Lot Help from another person to put on and taking off regular lower body clothing?: Total 6 Click Score: 9    End of Session Equipment Utilized During Treatment: Gait belt  OT Visit Diagnosis: Muscle weakness (generalized) (M62.81);Unsteadiness on feet (R26.81);Other symptoms and signs involving cognitive function   Activity Tolerance Patient limited by fatigue   Patient Left in chair;with call bell/phone within reach;with family/visitor present   Nurse Communication          Time: [id number] OT Time Calculation (min): 17 min  Charges: OT General Charges $OT Visit: 1 Visit OT Treatments $Self Care/Home Management : 8-22 mins  Nestor Lewandowsky, OTR/L Acute Rehabilitation Services Pager: [phone] Office: [phone]   Malka So 12/26/2018, 9:35 AM

## 2018-12-26 NOTE — Progress Notes (Signed)
NT in room to assist pt with breakfast. Pt refusing to eat or take sips to drink. Will attempt again later and continue to monitor.

## 2018-12-26 NOTE — Progress Notes (Signed)
Physical Therapy Treatment Patient Details Name: Sydney [name] MRN: [id number] DOB: 1930-07-16 Today's Date: 12/26/2018    History of Present Illness Sydney [name] is a 85 y.o. 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    PT Comments    Pt requiring increased assist this session for bed mobility and sit<>stand with max assist and strong posterior lean noted in standing. Pt declining transfer OOB this session secondary to fatigue and report of chest pain with activity (RN notified). Pt's daughter present throughout the session, discussed pt's current level of function and therapy recommendation for d/c to SNF. Pt's daughter reports that she would like to take her mother home with hopes of being able to provide some assistance as able.     Follow Up Recommendations  SNF;Supervision/Assistance - 24 hour(Daughter declines SNF, planning to return home)     Equipment Recommendations  Wheelchair (measurements PT);Wheelchair cushion (measurements PT)    Recommendations for Other Services       Precautions / Restrictions Precautions Precautions: Fall Restrictions Weight Bearing Restrictions: No    Mobility  Bed Mobility Overal bed mobility: Needs Assistance Bed Mobility: Supine to Sit;Sit to Supine     Supine to sit: Max assist Sit to supine: Max assist   General bed mobility comments: Pt with decreased initiation during bed mobility, requiring assist for LE management and trunk elevation  Transfers Overall transfer level: Needs assistance Equipment used: Rolling walker (2 wheeled) Transfers: Sit to/from Stand Sit to Stand: Max assist         General transfer comment: Pt requiring max assist to stand  with RW, pt declined transfer to recliner & gait secondary to weakness and report of chest pain. Pt requesting to lay back down.  Ambulation/Gait                 Stairs             Wheelchair Mobility    Modified Rankin (Stroke Patients Only)       Balance Overall balance assessment: Needs assistance Sitting-balance support: Bilateral upper extremity supported Sitting balance-Leahy Scale: Fair Sitting balance - Comments: intermittent assist needed to correct posterior lean in sitting Postural control: Posterior lean Standing balance support: Bilateral upper extremity supported;During functional activity Standing balance-Leahy Scale: Poor Standing balance comment: Upon standing pt with strong posterior lean requiring max assist to prevent LOB                            Cognition Arousal/Alertness: Awake/alert Behavior During Therapy: Flat affect Overall Cognitive Status: Impaired/Different from baseline Area of Impairment: Attention;Following commands;Problem solving;Safety/judgement                   Current Attention Level: Focused   Following Commands: Follows one step commands inconsistently Safety/Judgement: Decreased awareness of safety;Decreased awareness of deficits   Problem Solving: Slow processing;Decreased initiation        Exercises      General Comments General comments (skin integrity, edema, etc.): Pt's daughter in the room throughout session. This therapist provided education regarding d/c planning. Pt's daughter reports that she would like to take her mother home vs SNF.      Pertinent Vitals/Pain Faces Pain Scale: Hurts a little bit Pain  Location: Pt reports chest pain after standing activity, RN (kristi) notified Pain Descriptors / Indicators: Grimacing Pain Intervention(s): Monitored during session;Limited activity within patient's tolerance    Home Living                      Prior Function             PT Goals (current goals can now be found in the care plan section) Progress towards PT goals: Progressing toward goals    Frequency    Min 3X/week      PT Plan Discharge plan needs to be updated    Co-evaluation              AM-PAC PT "6 Clicks" Mobility   Outcome Measure  Help needed turning from your back to your side while in a flat bed without using bedrails?: A Little Help needed moving from lying on your back to sitting on the side of a flat bed without using bedrails?: A Lot Help needed moving to and from a bed to a chair (including a wheelchair)?: A Lot Help needed standing up from a chair using your arms (e.g., wheelchair or bedside chair)?: A Lot Help needed to walk in hospital room?: A Lot Help needed climbing 3-5 steps with a railing? : Total 6 Click Score: 12    End of Session Equipment Utilized During Treatment: Gait belt Activity Tolerance: Patient limited by fatigue Patient left: in bed;with call bell/phone within reach;with bed alarm set;with family/visitor present Nurse Communication: Mobility status PT Visit Diagnosis: Muscle weakness (generalized) (M62.81);Unsteadiness on feet (R26.81);Adult, failure to thrive (R62.7);Difficulty in walking, not elsewhere classified (R26.2)     Time: [id number] PT Time Calculation (min) (ACUTE ONLY): 17 min  Charges:  $Therapeutic Activity: 8-22 mins(17)                     Netta Corrigan, PT, DPT Acute Rehab Office [place] 12/26/2018, 2:21 PM

## 2018-12-26 NOTE — Progress Notes (Signed)
RN at bedside to encourage pt to eat and take scheduled medications. Pt continued to refuse to eat or take sips to drink. Pt refused potassium tablets. Aline August, MD aware. Will continue to monitor.

## 2018-12-26 NOTE — Plan of Care (Signed)
  Problem: Health Behavior/Discharge Planning: Goal: Ability to manage health-related needs will improve Outcome: Progressing   

## 2018-12-26 NOTE — Care Management Important Message (Signed)
Important Message  Patient Details  Name: Sydney [name] MRN: [id number] Date of Birth: 03/24/1931   Medicare Important Message Given:  Yes     [name] 12/26/2018, 4:43 PM

## 2018-12-26 NOTE — Progress Notes (Signed)
Hypoglycemic Event  CBG: 63  Treatment: 39mL D50 admin   Symptoms: pt asymptomatic  Follow-up CBG: Time: 0902  CBG Result: 111  Possible Reasons for Event: unknown  Comments/MD notified: Aline August, MD paged. Awaiting orders.  Will continue to monitor.  Racheal Patches, RN

## 2018-12-26 NOTE — Progress Notes (Signed)
Progress Note    ASSESSMENT AND PLAN:  1. Abnormal liver tests,  hepatocellular pattern and slowly progressive. AST 385 / ALT 153. Marland KitchenNormal coags. Viral hepatitis studies negative. No evidence for acute CMV or EBV. [place] with gb sludge, no other findings.  --Awaiting ANA, ASMA, AMA --am liver tests  2. Failure to thrive. Cause not clear. No underlying malignancies seen on CT AP. Had fluid in colon but not sure clinically significant in absence of diarrhea. No evidence for infection ( ID has evaluated). Psychiatry evaluated and not sure if mood contributing or not and Daughter not comfortable starting medication at this point). She does appear to have thrush --She was started on Megace.  --Daughter in room trying to feed her. Patient still will not eat. She is somewhat lethargic -Ritta Slot can alter taste, may be one reason she isn't eating. White coating on tongue and not from Nystatin as she refused it earlier today. Hesitant to start Diflucan given abnormal liver tests but if keeps refusing then will need IV antifungal.  --Workup for abnormal liver tests in progress.   3. Pancytopenia. Chronic intermittent leukopenia and anemia but not with mild thrombocytopenia. Hematology evaluated and feel this is multifactorial (poor nutrition / ? MDS. Hematology not planning any inpatient workup for now.   4. Hypokalemia, K+ 2.7. Repletion in progress      SUBJECTIVE   Daughter at bedside. Patient will not eat. She did have some soup yesterday. No complaints of abdominal pain. Denies ongoing sore throat.   OBJECTIVE:     Vital signs in last 24 hours: Temp:  [98.2 F (36.8 C)-98.8 F (37.1 C)] 98.8 F (37.1 C) (08/31 0619) Pulse Rate:  [68-81] 75 (08/31 0619) Resp:  [15-18] 18 (08/31 0619) BP: (100-103)/(56-71) 100/56 (08/31 0619) SpO2:  [97 %-99 %] 99 % (08/31 0619) Weight:  [61 kg] 61 kg (08/31 0500) Last BM Date: 12/25/18 General:   Awale but keeps eyes closed. Follows commands after  being asked a couple of times. Daughter at bedside.  Heart:  Regular rate and rhythm;  No lower extremity edema   Pulm: Normal respiratory effort, lungs CTA bilaterally without wheezes or crackles. Abdomen:  Soft, nondistended, nontender.  Hyperactive bowel sounds, tympanitic.           Intake/Output from previous day: 08/30 0701 - 08/31 0700 In: 100 [IV Piggyback:100] Out: 600 [Urine:600] Intake/Output this shift: Total I/O In: 20 [P.O.:20] Out: 500 [Urine:400; Stool:100]  Lab Results: Recent Labs    12/24/18 0154 12/25/18 0302 12/26/18 0323  WBC 1.6* 1.7* 1.5*  HGB 10.1* 10.1* 10.5*  HCT 29.1* 28.8* 30.3*  PLT 94* 90* 80*   BMET Recent Labs    12/24/18 0154 12/25/18 0302 12/26/18 0323  NA 135 134* 134*  K 3.0* 2.7* 2.7*  CL 111 104 104  CO2 19* 24 24  GLUCOSE 107* 107* 77  BUN 10 8 10   CREATININE 0.69 0.57 0.74  CALCIUM 7.8* 7.5* 7.5*   LFT Recent Labs    12/26/18 0323  PROT 4.7*  ALBUMIN 1.8*  AST 386*  ALT 153*  ALKPHOS 98  BILITOT 0.5   Principal Problem:   Evaluation by psychiatric service required Active Problems:   Pancytopenia ([place])   Hyponatremia   Protein calorie malnutrition ([facility])   History of TIA (transient ischemic attack)   Generalized weakness   Pressure injury of skin   Transaminitis   Malnutrition of moderate degree     LOS: 6 days  Tye Savoy ,NP 12/26/2018, 12:12 PM

## 2018-12-27 DIAGNOSIS — R945 Abnormal results of liver function studies: Secondary | ICD-10-CM | POA: Diagnosis not present

## 2018-12-27 DIAGNOSIS — R531 Weakness: Secondary | ICD-10-CM | POA: Diagnosis not present

## 2018-12-27 DIAGNOSIS — D61818 Other pancytopenia: Secondary | ICD-10-CM | POA: Diagnosis not present

## 2018-12-27 DIAGNOSIS — E871 Hypo-osmolality and hyponatremia: Secondary | ICD-10-CM | POA: Diagnosis not present

## 2018-12-27 DIAGNOSIS — E440 Moderate protein-calorie malnutrition: Secondary | ICD-10-CM | POA: Diagnosis not present

## 2018-12-27 LAB — CBC WITH DIFFERENTIAL/PLATELET
Abs Immature Granulocytes: 0.01 10*3/uL (ref 0.00–0.07)
Basophils Absolute: 0 10*3/uL (ref 0.0–0.1)
Basophils Relative: 0 %
Eosinophils Absolute: 0 10*3/uL (ref 0.0–0.5)
Eosinophils Relative: 2 %
HCT: 30.9 % — ABNORMAL LOW (ref 36.0–46.0)
Hemoglobin: 10.4 g/dL — ABNORMAL LOW (ref 12.0–15.0)
Immature Granulocytes: 1 %
Lymphocytes Relative: 26 %
Lymphs Abs: 0.3 10*3/uL — ABNORMAL LOW (ref 0.7–4.0)
MCH: 31.5 pg (ref 26.0–34.0)
MCHC: 33.7 g/dL (ref 30.0–36.0)
MCV: 93.6 fL (ref 80.0–100.0)
Monocytes Absolute: 0.2 10*3/uL (ref 0.1–1.0)
Monocytes Relative: 16 %
Neutro Abs: 0.7 10*3/uL — ABNORMAL LOW (ref 1.7–7.7)
Neutrophils Relative %: 55 %
Platelets: 88 10*3/uL — ABNORMAL LOW (ref 150–400)
RBC: 3.3 MIL/uL — ABNORMAL LOW (ref 3.87–5.11)
RDW: 14.8 % (ref 11.5–15.5)
WBC: 1.3 10*3/uL — CL (ref 4.0–10.5)
nRBC: 0 % (ref 0.0–0.2)

## 2018-12-27 LAB — COMPREHENSIVE METABOLIC PANEL
ALT: 152 U/L — ABNORMAL HIGH (ref 0–44)
AST: 398 U/L — ABNORMAL HIGH (ref 15–41)
Albumin: 2 g/dL — ABNORMAL LOW (ref 3.5–5.0)
Alkaline Phosphatase: 105 U/L (ref 38–126)
Anion gap: 6 (ref 5–15)
BUN: 9 mg/dL (ref 8–23)
CO2: 20 mmol/L — ABNORMAL LOW (ref 22–32)
Calcium: 7.5 mg/dL — ABNORMAL LOW (ref 8.9–10.3)
Chloride: 107 mmol/L (ref 98–111)
Creatinine, Ser: 0.81 mg/dL (ref 0.44–1.00)
GFR calc Af Amer: >60 mL/min (ref >60)
GFR calc non Af Amer: >60 mL/min (ref >60)
Glucose, Bld: 106 mg/dL — ABNORMAL HIGH (ref 70–99)
Potassium: 3.8 mmol/L (ref 3.5–5.1)
Sodium: 133 mmol/L — ABNORMAL LOW (ref 135–145)
Total Bilirubin: 0.6 mg/dL (ref 0.3–1.2)
Total Protein: 5.1 g/dL — ABNORMAL LOW (ref 6.5–8.1)

## 2018-12-27 LAB — MAGNESIUM: Magnesium: 2.1 mg/dL (ref 1.7–2.4)

## 2018-12-27 LAB — GLUCOSE, CAPILLARY: Glucose-Capillary: 153 mg/dL — ABNORMAL HIGH (ref 70–99)

## 2018-12-27 MED ORDER — KETOROLAC TROMETHAMINE 15 MG/ML IJ SOLN
15.0000 mg | Freq: Four times a day (QID) | INTRAMUSCULAR | Status: AC | PRN
  Administered 2018-12-30: 15 mg via INTRAVENOUS
  Filled 2018-12-27: qty 1

## 2018-12-27 NOTE — Progress Notes (Signed)
CRITICAL VALUE ALERT  Critical Value:  WBC 1.3  Date & Time Notied: 9/12020 @ 0250  Provider Notified: Lovey Newcomer NP  Orders Received/Actions taken: none as of now

## 2018-12-27 NOTE — Progress Notes (Signed)
Nutrition Follow-up  DOCUMENTATION CODES:   Non-severe (moderate) malnutrition in context of chronic illness  INTERVENTION:   -Continue dysphagia 3 diet (advanced mechanical soft) for ease of intake -Continue Boost Breeze po TID, each supplement provides 250 kcal and 9 grams of protein -Continue 30 ml Prostat BID, each supplement provides 100 kcals and 15 grams protein -Continue Magic cup TID with meals, each supplement provides 290 kcal and 9 grams of protein -Continue MVI with minerals daily  -Consider initiation of nutrition support. Recommend:  Initiate Osmolite 1.2 @ 25 ml/hr and increase by 10 ml every 8 hours to goal rate of 45 ml/hr.   Tube feeding regimen provides 1296 kcal (100% of needs), 60 grams of protein, and 886 ml of H2O.   -If feeding are initiated, recommend monitor K, Mg, and Phos daily x 3 days and replete as needed secondary to high refeeding risk    NUTRITION DIAGNOSIS:   Moderate Malnutrition related to chronic illness(TIA) as evidenced by mild fat depletion, moderate fat depletion, mild muscle depletion, moderate muscle depletion.  Ongoing  GOAL:   Patient will meet greater than or equal to 90% of their needs  Progressing   MONITOR:   PO intake, Supplement acceptance, Labs, Weight trends, Skin, I & O's  REASON FOR ASSESSMENT:   Consult Assessment of nutrition requirement/status  ASSESSMENT:   Sydney [name] is a 85 y.o. 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  Patient is accompanied by her daughter who assists with the history.  Patient has history of recurrent C. difficile colitis, has continued to have loose stools, though this has improved to 4 or 5/day, has had  progressively worsening appetite, eating only 3 or 4 bites and unable to even drink half of a nutritional supplement drink.  Patient denies any abdominal pain since the recent hospitalization, no fevers or chills have been noted, and she denies any dysuria though she may have complained of this a few days ago.  She was requiring assistance with ambulation and had home health PT set up at recent discharge, but has become even more weak in general since leaving the hospital and had a near syncopal episode today when she tried to get up from a bedside commode.  8/27- megace initiated, SLP recommend D3 diet with thin liquids 8/29- nystatin added for oral thrush  Reviewed I/O's: -356 ml x 24 hours and +1.4 L since admission  UOP: 1.5 L x 24 hours  Pt very somnolent and refusing most PO's and medications. She is also refusing supplements. Per GI notes, plan for MRI/MRCP. GI has also discussed with pt daughter about possible NGT tube, however, would like to continue to think about it.   Per palliative care notes, pt daughter requesting continued aggressive care. Pt complains about painful swallowing due to oral thrush.  Medications reviewed and include dextrose 5% and 0.9% NaCl with KCl 40 mEq/L infusion @ 75 ml/hr.   Labs reviewed: Na: 134, K: 2.7, CBGS: 63-111.   Diet Order:   Diet Order            DIET DYS 3 Room service appropriate? Yes; Fluid consistency: Thin  Diet effective now              EDUCATION NEEDS:   Not appropriate for education  at this time  Skin:  Skin Assessment: Skin Integrity Issues: Skin Integrity Issues:: Stage II Stage II: sacrum, coccyx  Last BM:  12/27/18  Height:   Ht Readings from Last 1 Encounters:  12/20/18 5\' 3"  (1.6 m)    Weight:   Wt Readings from Last 1 Encounters:  12/26/18 61 kg    Ideal Body Weight:  52.3 kg  BMI:  Body mass index is 23.82 kg/m.  Estimated Nutritional Needs:   Kcal:  1250-1450  Protein:  60-75 grams  Fluid:  >  1.2 L    Amin Fornwalt A. Jimmye Norman, RD, LDN, [place] Registered Dietitian II Certified Diabetes Care and Education Specialist Pager: [phone] After hours Pager: [phone]

## 2018-12-27 NOTE — Progress Notes (Signed)
          Daily Rounding Note  12/27/2018, 10:51 AM  LOS: 7 days   SUBJECTIVE:   Chief complaint: Abnormal LFTs.  FTT.  Pancytopenia. Refusing to eat.  Refusing to take oral meds.  Becoming increasingly vegetative.  No specific complaints.  OBJECTIVE:         Vital signs in last 24 hours:    Temp:  [97.7 F (36.5 C)-98.4 F (36.9 C)] 97.7 F (36.5 C) (09/01 0420) Pulse Rate:  [72-87] 85 (09/01 0420) Resp:  [18-20] 20 (09/01 0420) BP: (93-122)/(60-72) 110/72 (09/01 0420) SpO2:  [98 %-99 %] 99 % (09/01 0420) Last BM Date: 12/25/18 Filed Weights   12/24/18 0500 12/25/18 0500 12/26/18 0500  Weight: 50.3 kg 50 kg 61 kg   General: Lying still in bed.  Looks frail, cachectic.  Not engaged. OP: Sloughing of skin on her lips.  Patient would not open mouth for oral exam. Heart: RRR. Chest: No labored breathing, no cough.  Lungs clear in front. Abdomen: Bowel sounds present but hypoactive.  No distention.  No masses.  No tenderness. Extremities: Muscle wasting on the limbs and trunk. Neuro/Psych: Not speaking.  Although her eyes are open she was not following commands.  Intake/Output from previous day: 08/31 0701 - 09/01 0700 In: 1244.3 [P.O.:20; I.V.:1224.3] Out: 1600 [Urine:1500; Stool:100]  Intake/Output this shift: No intake/output data recorded.  Lab Results: Recent Labs    12/25/18 0302 12/26/18 0323 12/27/18 0133  WBC 1.7* 1.5* 1.3*  HGB 10.1* 10.5* 10.4*  HCT 28.8* 30.3* 30.9*  PLT 90* 80* 88*   BMET Recent Labs    12/25/18 0302 12/26/18 0323 12/27/18 0133  NA 134* 134* 133*  K 2.7* 2.7* 3.8  CL 104 104 107  CO2 24 24 20*  GLUCOSE 107* 77 106*  BUN 8 10 9   CREATININE 0.57 0.74 0.81  CALCIUM 7.5* 7.5* 7.5*   LFT Recent Labs    12/25/18 0302 12/26/18 0323 12/27/18 0133  PROT 4.8* 4.7* 5.1*  ALBUMIN 1.9* 1.8* 2.0*  AST 356* 386* 398*  ALT 153* 153* 152*  ALKPHOS 83 98 105  BILITOT 0.7 0.5 0.6     Studies/Results: No results found.  ASSESMENT:   *    Abnormal transaminases.  Central hepatic cyst without abnormal biliary tree, no gallstones, normal pancreas and PD per CT scan ANA negative.  Smooth muscle IgG negative.  Mitochondrial Ab negative. CMV IgM negative.  EBV IgM negative.  EBV IgG's elevated (remote, and active infection per Dr. Megan Salon). .   Transaminases continue to steadily climb.   *      Anorexia, FTT. Pt/family refusing megace, ordered last week.  Refusing all oral meds and nutritional supplements.  Dtr did not want anti-depressants started [facility] care favored) due to no clear indication pt sxs are due to depression.  Psychiatrist had offered Remeron.      *       Pancytopenia.  *        Chronic Plavix.  Last dose was 8/29, refused med since then   PLAN   *   Proceed to MRI/ MRCP.  Discussed this with patient's daughter. LFTs QOD.    *   Stop Plavix, this in anticipation she may need EGD, liver biopsy or some sort of invasive procedure.   Already off for 3 days.      Sydney [name]  12/27/2018, 10:51 AM Phone [phone]

## 2018-12-27 NOTE — Plan of Care (Signed)

## 2018-12-27 NOTE — Progress Notes (Signed)
Pt complaining of leg pain. Aline August, MD ordered IV toradol. Per pt's daughter, pt refusing any kind of pain med. RN educated pt that pain med is IV so pt would not have to swallow a pill. Pt refused. Aline August, MD aware. Will continue to monitor.

## 2018-12-27 NOTE — Progress Notes (Signed)
Physical Therapy Treatment Patient Details Name: Sydney [name] MRN: [id number] DOB: 27-May-1930 Today's Date: 12/27/2018    History of Present Illness Sydney [name] is a 85 y.o. 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    PT Comments    Pt continues to be limited by decreased motivation, fatigue, and decreased appetite limiting her ability to participate in therapy tx. Pt performed bed mobility this session with max assist however unwilling to attempt standing or OOB transfer. Daughter present throughout session and encouraging patient to participate, pt still continues to be limited. Pt did perform seated exercises EOB with increased encouragement and assist for initiation. Therapist continues to recommend SNF level therapy in order to maximize functional mobility. Daughter would like to take pt home, discussed need for hospital bed, lift, and w/c.   Follow Up Recommendations  SNF;Supervision/Assistance - 24 hour(daughter declining SNF, wants to take pt home)     Equipment Recommendations  Wheelchair (measurements PT);Wheelchair cushion (measurements PT);Hospital bed;Other (comment)(hoyer lift)    Recommendations for Other Services       Precautions / Restrictions Precautions Precautions: Fall Restrictions Weight Bearing Restrictions: No    Mobility  Bed Mobility Overal bed mobility: Needs Assistance Bed Mobility: Supine to Sit;Sit to Supine     Supine to sit: Max assist Sit to supine: Max assist   General bed mobility comments: Pt with decreased initiation during bed mobility, requiring assist for LE management and trunk elevation  Transfers                 General transfer comment: unable to  perform transfer this session, pt actively resisting and continues to report "lay me back down"  Ambulation/Gait                 Stairs             Wheelchair Mobility    Modified Rankin (Stroke Patients Only)       Balance Overall balance assessment: Needs assistance Sitting-balance support: Bilateral upper extremity supported;Feet supported Sitting balance-Leahy Scale: Poor Sitting balance - Comments: fluctuating between supervision and min assist for sitting balance secondary to posterior lean                                    Cognition Arousal/Alertness: Awake/alert Behavior During Therapy: Flat affect Overall Cognitive Status: Impaired/Different from baseline Area of Impairment: Attention;Following commands;Problem solving                   Current Attention Level: Selective   Following Commands: Follows one step commands with increased time     Problem Solving: Decreased initiation General Comments: pt continues to repeat throughout session "just lay me back down." Pt requiring assist for all initiation of movement      Exercises General Exercises - Upper Extremity Shoulder Flexion: AAROM;Both;Seated General Exercises - Lower Extremity Long Arc Quad: AROM;Both;Seated    General Comments General comments (skin integrity, edema, etc.): Pt's daughter in room throughout the session, continued to discuss therapy reccommendation of d/c to SNF however pt's daughter continues to report she would like to take her mother home and is willing to provide the assist as needed.  Pertinent Vitals/Pain Pain Assessment: No/denies pain    Home Living                      Prior Function            PT Goals (current goals can now be found in the care plan section) Progress towards PT goals: Progressing toward goals    Frequency    Min 3X/week      PT Plan Current plan remains appropriate    Co-evaluation               AM-PAC PT "6 Clicks" Mobility   Outcome Measure  Help needed turning from your back to your side while in a flat bed without using bedrails?: A Lot Help needed moving from lying on your back to sitting on the side of a flat bed without using bedrails?: A Lot Help needed moving to and from a bed to a chair (including a wheelchair)?: Total Help needed standing up from a chair using your arms (e.g., wheelchair or bedside chair)?: Total Help needed to walk in hospital room?: Total Help needed climbing 3-5 steps with a railing? : Total 6 Click Score: 8    End of Session   Activity Tolerance: Patient limited by fatigue Patient left: in bed;with call bell/phone within reach;with bed alarm set;with family/visitor present Nurse Communication: Mobility status PT Visit Diagnosis: Muscle weakness (generalized) (M62.81);Unsteadiness on feet (R26.81);Adult, failure to thrive (R62.7);Difficulty in walking, not elsewhere classified (R26.2)     Time: [id number] PT Time Calculation (min) (ACUTE ONLY): 18 min  Charges:  $Therapeutic Activity: 8-22 mins                     Netta Corrigan, PT, DPT [facility] [place] 12/27/2018, 5:13 PM

## 2018-12-27 NOTE — Progress Notes (Signed)
Patient ID: Sydney [name], female   DOB: 19-Mar-1931, 85 y.o.   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  CT of the abdomen and pelvis was negative for acute intra-abdominal or pelvic abnormality.  She was found to have mildly elevated LFTs along with hyponatremia and pancytopenia.  She was given IV fluids.  ID and GI were consulted.  Palliative care was subsequently consulted for goals of care discussion.  Assessment & Plan:   Generalized weakness Moderate protein calorie malnutrition/failure to thrive Hypoalbuminemia -Questionable cause. -There might be a component of depression as well leading to very poor oral intake.  Psychiatry evaluation appreciated.  Noted not interested in starting antidepressant at this time.  Outpatient follow-up with psychiatry. -CT of the head was negative for acute intracranial abnormality. -MRI of the brain showed no evidence of acute stroke.  She might need outpatient neurology evaluation. -No evidence of UTI.  Monitor off antibiotics. -PT/OT evaluation.  Patient looks very deconditioned  -Oral intake is still poor.  IV fluids were discontinued on 12/25/2018 but restarted on 12/26/2018 because of continued poor intake.  Continue Megace which was started by GI.   Encourage patient to increase oral intake. -Follow nutrition consult recommendations -Overall prognosis is guarded to poor.  Palliative care consultation evaluation appreciated.  Patient's daughter at this time wants aggressive treatment and is not interested in palliative care discussions.  Because of her continued very poor intake, unclear  if patient will get better with current treatment modalities. -I told the daughter again today at bedside that she should consider starting at least NG feeding.  She will think about it.  She wants to wait for MRI of her abdomen and further GI recommendations before making any decisions.  Odynophagia with possible oropharyngeal candidiasis -Cannot use Diflucan because of increased LFTs.  Nystatin orally has been ordered but patient refuses all her meds.  GI following.  GI might have to consider upper GI endoscopy.  Elevated LFTs -Abdominal exam is benign.  GI following.  Hepatitis panel negative.  EBV and CMV IgM are negative.  Right upper quadrant ultrasound only showed gallbladder sludging. -LFTs still trending up slightly. -CT of the abdomen with no evidence of acute intra-abdominal or pelvic abnormality but with diffuse fluid within the colon suggestive of possible enteritis or diarrheal illness -GI recommends MRI for better imaging of the hepatic parenchyma.  Pancytopenia -Questionable cause.  Monitor.  Hematology evaluation appreciated.  Patient has been started on vitamin B12 parenterally as she had low levels of vitamin B12 during last admission but vitamin B12 level normal this admission.  No improvement with empiric vitamin B12 supplementation. -Outpatient follow-up with hematology.  No plans for bone marrow biopsy during this admission.  Hyponatremia -Sodium 128 in the ED.  133 today.  Off IV fluids.  Hypokalemia -Improved.  Hypomagnesemia-improved  History of TIA -continue aspirin.  Patient has not received Plavix since 12/24/2018 as she has refused.  Stop Plavix for now for possible need for EGD/liver biopsy or any sort of invasive procedure.  Stage II sacral pressure injury: Present on admission -Wound care as per wound care consult recommendations.  Generalized deconditioning--PT recommends SNF placement.  Daughter thinks that  patient will get better and will be able to go  home.   DVT prophylaxis: SCDs Code Status: Full Family Communication: Spoke to daughter/Angela at bedside on 12/27/2018 disposition Plan: Unclear at this time  Consultants: ID/GI/hematology/palliative care  Procedures: None  Antimicrobials: None   Subjective: Patient seen and examined at bedside.  She would hardly open her eyes or participate in any conversation.  Daughter present at bedside.  Patient refused medication/food again this morning.  No overnight fever or vomiting   objective: Vitals:   12/26/18 1514 12/26/18 1942 12/26/18 1952 12/27/18 0420  BP: 93/60 122/72 122/72 110/72  Pulse: 72 87 86 85  Resp: '20 18 20 20  ' Temp: 98.4 F (36.9 C)  98.4 F (36.9 C) 97.7 F (36.5 C)  TempSrc: Oral Oral Oral Oral  SpO2: 99% 98% 98% 99%  Weight:      Height:        Intake/Output Summary (Last 24 hours) at 12/27/2018 1131 Last data filed at 12/27/2018 0300 Gross per 24 hour  Intake 1224.33 ml  Output 1100 ml  Net 124.33 ml   Filed Weights   12/24/18 0500 12/25/18 0500 12/26/18 0500  Weight: 50.3 kg 50 kg 61 kg    Examination:  General exam: No acute distress.  Looks chronically ill.  Very thinly built.  Does not communicate much.  Very poor historian.   Respiratory system: Bilateral decreased breath sounds at bases with some crackles  cardiovascular system: S1-S2 heard, rate controlled Gastrointestinal system: Abdomen is nondistended, soft and nontender. Normal bowel sounds heard. Extremities: No cyanosis, edema   Data Reviewed: I have personally reviewed following labs and imaging studies  CBC: Recent Labs  Lab 12/23/18 0554 12/24/18 0154 12/25/18 0302 12/26/18 0323 12/27/18 0133  WBC 1.8* 1.6* 1.7* 1.5* 1.3*  NEUTROABS 1.2* 1.0* 1.1* 0.8* 0.7*  HGB 10.5* 10.1* 10.1* 10.5* 10.4*  HCT 30.5* 29.1* 28.8* 30.3* 30.9*  MCV 90.8 89.5 89.2 91.0 93.6  PLT 101* 94* 90* 80* 88*   Basic Metabolic Panel: Recent Labs  Lab 12/23/18 0554 12/24/18 0154 12/25/18 0302  12/26/18 0323 12/27/18 0133  NA 134* 135 134* 134* 133*  K 3.4* 3.0* 2.7* 2.7* 3.8  CL 114* 111 104 104 107  CO2 16* 19* 24 24 20*  GLUCOSE 98 107* 107* 77 106*  BUN '9 10 8 10 9  ' CREATININE 0.67 0.69 0.57 0.74 0.81  CALCIUM 7.9* 7.8* 7.5* 7.5* 7.5*  MG 1.8 1.7 1.6* 2.3 2.1   GFR: Estimated Creatinine Clearance: 41.2 mL/min (by C-G formula based on SCr of 0.81 mg/dL). Liver Function Tests: Recent Labs  Lab 12/23/18 0554 12/24/18 0154 12/25/18 0302 12/26/18 0323 12/27/18 0133  AST 311* 327* 356* 386* 398*  ALT 141* 145* 153* 153* 152*  ALKPHOS 86 82 83 98 105  BILITOT 0.5 0.6 0.7 0.5 0.6  PROT 4.8* 4.8* 4.8* 4.7* 5.1*  ALBUMIN 1.9* 1.9* 1.9* 1.8* 2.0*   No results for input(s): LIPASE, AMYLASE in the last 168 hours. No results for input(s): AMMONIA in the last 168 hours. Coagulation Profile: No results for input(s): INR, PROTIME in the last 168 hours. Cardiac Enzymes: No results for input(s): CKTOTAL, CKMB, CKMBINDEX, TROPONINI in the last 168 hours. BNP (last 3 results) No results for input(s): PROBNP in the last 8760 hours. HbA1C: No results for input(s): HGBA1C in the last 72 hours. CBG: Recent Labs  Lab 12/24/18 0749 12/25/18 0747 12/26/18 0828 12/26/18 0902 12/27/18 0827  GLUCAP 97 104* 63* 111* 153*  Lipid Profile: No results for input(s): CHOL, HDL, LDLCALC, TRIG, CHOLHDL, LDLDIRECT in the last 72 hours. Thyroid Function Tests: No results for input(s): TSH, T4TOTAL, FREET4, T3FREE, THYROIDAB in the last 72 hours. Anemia Panel: No results for input(s): VITAMINB12, FOLATE, FERRITIN, TIBC, IRON, RETICCTPCT in the last 72 hours. Sepsis Labs: No results for input(s): PROCALCITON, LATICACIDVEN in the last 168 hours.  Recent Results (from the past 240 hour(s))  Urine Culture     Status: Abnormal   Collection Time: 12/19/18  4:12 AM   Specimen: Urine, Clean Catch  Result Value Ref Range Status   Specimen Description URINE, CLEAN CATCH  Final   Special  Requests NONE  Final   Culture (A)  Final    <10,000 COLONIES/mL INSIGNIFICANT GROWTH Performed at [facility], [address] [place], [place] [address]    Report Status 12/21/2018 FINAL  Final  Blood culture (routine x 2)     Status: None   Collection Time: 12/20/18 12:12 AM   Specimen: BLOOD RIGHT FOREARM  Result Value Ref Range Status   Specimen Description BLOOD RIGHT FOREARM  Final   Special Requests   Final    BOTTLES DRAWN AEROBIC AND ANAEROBIC Blood Culture adequate volume   Culture   Final    NO GROWTH 5 DAYS Performed at [facility], [place] [address], [place], [place] [address]    Report Status 12/25/2018 FINAL  Final  Blood culture (routine x 2)     Status: None   Collection Time: 12/20/18 12:43 AM   Specimen: BLOOD  Result Value Ref Range Status   Specimen Description BLOOD RIGHT ANTECUBITAL  Final   Special Requests   Final    BOTTLES DRAWN AEROBIC AND ANAEROBIC Blood Culture adequate volume   Culture   Final    NO GROWTH 5 DAYS Performed at [facility], [place] [address], [place], [place] [address]    Report Status 12/25/2018 FINAL  Final  SARS CORONAVIRUS 2 (TAT 6-12 HRS) Nasal Swab Aptima Multi Swab     Status: None   Collection Time: 12/20/18  3:52 AM   Specimen: Aptima Multi Swab; Nasal Swab  Result Value Ref Range Status   SARS Coronavirus 2 NEGATIVE NEGATIVE Final    Comment: (NOTE) SARS-CoV-2 target nucleic acids are NOT DETECTED. The SARS-CoV-2 RNA is generally detectable in upper and lower respiratory specimens during the acute phase of infection. Negative results do not preclude SARS-CoV-2 infection, do not rule out co-infections with other pathogens, and should not be used as the sole basis for treatment or other patient management decisions. Negative results must be combined with clinical observations, patient history, and epidemiological information. The expected result is Negative. Fact Sheet for  Patients: SugarRoll.be Fact Sheet for Healthcare Providers: https://www.woods-mathews.com/ This test is not yet approved or cleared by the [place] FDA and  has been authorized for detection and/or diagnosis of SARS-CoV-2 by FDA under an Emergency Use Authorization (EUA). This EUA will remain  in effect (meaning this test can be used) for the duration of the COVID-19 declaration under Section 56 4(b)(1) of the Act, 21 U.S.C. section 360bbb-3(b)(1), unless the authorization is terminated or revoked sooner. Performed at [facility], [place] [address] [place], [place] [address]   Culture, group A strep     Status: None (Preliminary result)   Collection Time: 12/25/18 11:27 AM   Specimen: Throat  Result Value Ref Range Status   Specimen Description THROAT  Final  Special Requests NONE  Final   Culture   Final    CULTURE REINCUBATED FOR BETTER GROWTH Performed at [facility], [place] [address] [place], [place] [address]    Report Status PENDING  Incomplete         Radiology Studies: No results found.      Scheduled Meds:  aspirin EC  81 mg Oral Daily   feeding supplement  1 Container Oral TID BM   feeding supplement (PRO-STAT SUGAR FREE 64)  30 mL Oral BID WC   folic acid  1 mg Oral Daily   Gerhardt's butt cream   Topical TID   megestrol  400 mg Oral Daily   multivitamin with minerals  1 tablet Oral Daily   nystatin  5 mL Oral QID   pantoprazole  20 mg Oral Daily   sodium chloride flush  3 mL Intravenous Q12H   sodium chloride flush  3 mL Intravenous Q12H   Continuous Infusions:  sodium chloride     dextrose 5 % and 0.9 % NaCl with KCl 40 mEq/L 75 mL/hr at 12/27/18 0300     LOS: 7 days        Aline August, MD [facility] 12/27/2018, 11:31 AM

## 2018-12-28 ENCOUNTER — Inpatient Hospital Stay (HOSPITAL_COMMUNITY): Payer: Medicare Other

## 2018-12-28 DIAGNOSIS — E440 Moderate protein-calorie malnutrition: Secondary | ICD-10-CM | POA: Diagnosis not present

## 2018-12-28 DIAGNOSIS — Z008 Encounter for other general examination: Secondary | ICD-10-CM | POA: Diagnosis not present

## 2018-12-28 DIAGNOSIS — R945 Abnormal results of liver function studies: Secondary | ICD-10-CM | POA: Diagnosis not present

## 2018-12-28 LAB — CBC WITH DIFFERENTIAL/PLATELET
Abs Immature Granulocytes: 0.02 10*3/uL (ref 0.00–0.07)
Basophils Absolute: 0 10*3/uL (ref 0.0–0.1)
Basophils Relative: 1 %
Eosinophils Absolute: 0.1 10*3/uL (ref 0.0–0.5)
Eosinophils Relative: 3 %
HCT: 36.1 % (ref 36.0–46.0)
Hemoglobin: 11.7 g/dL — ABNORMAL LOW (ref 12.0–15.0)
Immature Granulocytes: 1 %
Lymphocytes Relative: 19 %
Lymphs Abs: 0.3 10*3/uL — ABNORMAL LOW (ref 0.7–4.0)
MCH: 31.1 pg (ref 26.0–34.0)
MCHC: 32.4 g/dL (ref 30.0–36.0)
MCV: 96 fL (ref 80.0–100.0)
Monocytes Absolute: 0.3 10*3/uL (ref 0.1–1.0)
Monocytes Relative: 18 %
Neutro Abs: 0.9 10*3/uL — ABNORMAL LOW (ref 1.7–7.7)
Neutrophils Relative %: 58 %
Platelets: 85 10*3/uL — ABNORMAL LOW (ref 150–400)
RBC: 3.76 MIL/uL — ABNORMAL LOW (ref 3.87–5.11)
RDW: 15.4 % (ref 11.5–15.5)
WBC: 1.5 10*3/uL — ABNORMAL LOW (ref 4.0–10.5)
nRBC: 0 % (ref 0.0–0.2)

## 2018-12-28 LAB — CULTURE, GROUP A STREP (THRC)

## 2018-12-28 LAB — COMPREHENSIVE METABOLIC PANEL
ALT: 159 U/L — ABNORMAL HIGH (ref 0–44)
AST: 433 U/L — ABNORMAL HIGH (ref 15–41)
Albumin: 2.1 g/dL — ABNORMAL LOW (ref 3.5–5.0)
Alkaline Phosphatase: 112 U/L (ref 38–126)
Anion gap: 6 (ref 5–15)
BUN: 12 mg/dL (ref 8–23)
CO2: 16 mmol/L — ABNORMAL LOW (ref 22–32)
Calcium: 7.7 mg/dL — ABNORMAL LOW (ref 8.9–10.3)
Chloride: 114 mmol/L — ABNORMAL HIGH (ref 98–111)
Creatinine, Ser: 0.8 mg/dL (ref 0.44–1.00)
GFR calc Af Amer: >60 mL/min (ref >60)
GFR calc non Af Amer: >60 mL/min (ref >60)
Glucose, Bld: 83 mg/dL (ref 70–99)
Potassium: 3.9 mmol/L (ref 3.5–5.1)
Sodium: 136 mmol/L (ref 135–145)
Total Bilirubin: 0.6 mg/dL (ref 0.3–1.2)
Total Protein: 5.2 g/dL — ABNORMAL LOW (ref 6.5–8.1)

## 2018-12-28 LAB — MAGNESIUM: Magnesium: 2.2 mg/dL (ref 1.7–2.4)

## 2018-12-28 NOTE — Progress Notes (Signed)
This chaplain stopped by the Pt. room per the Pt. daughter request.  At the time of the visit, the chaplain checked in with the Pt. RN-Celso and observed the Pt. was resting.  The chaplain is available for F/U spiritual care as needed.

## 2018-12-28 NOTE — Progress Notes (Signed)
Pt's daughter was updated on pt's current condition and her refusal to do MRI.

## 2018-12-28 NOTE — Progress Notes (Signed)
Called by Radiology tech saying pt refuses MRI, pt was screaming , they attempted 3x but unsuccessful

## 2018-12-28 NOTE — Progress Notes (Addendum)
Daily Rounding Note  12/28/2018, 11:26 AM  LOS: 8 days   SUBJECTIVE:   Chief complaint: elevated LFTs    Pt refused MRI, was screaming in radiology.   Continues to refuse po.  Not interacting with staff. Daughter says she is interactive with her.  Daughter says the patient complains of right leg pain, specifics as to the character of pain, triggers for pain.  She had a hip x-ray when she came in on the 21st and this did not show a fracture.  Daughter resistant to the idea that the patient could be depressed.   She feels that the leg pain is the problem.  Dtr says her mother tells her she is frustrated but a frustrated pt does not refused to speak or take meds, and would tend to have more active refusal than what Mrs. Rubright has been displaying for the duration of her hospital stay.  OBJECTIVE:         Vital signs in last 24 hours:    Temp:  [97.4 F (36.3 C)-98.4 F (36.9 C)] 98.4 F (36.9 C) (09/02 0457) Pulse Rate:  [79-91] 90 (09/02 0457) Resp:  [16-22] 16 (09/02 0457) BP: (112-120)/(64-74) 120/74 (09/02 0457) SpO2:  [98 %-100 %] 98 % (09/02 0457) Last BM Date: 12/25/18 Filed Weights   12/24/18 0500 12/25/18 0500 12/26/18 0500  Weight: 50.3 kg 50 kg 61 kg   General: Laying in bed.  Shaking her head no to requests for opening her mouth Heart:   Aching her head no when I attempted to listen to her heart and lungs.  I did not proceed with further examination Chest: No labored breathing. Abdomen: Extremities: No lower extremity edema. Neuro/Psych: Vegetative.  Minimal interaction.  Is able to nod her head.  Not following commands.  Intake/Output from previous day: 09/01 0701 - 09/02 0700 In: 1046.6 [P.O.:100; I.V.:946.6] Out: 400 [Urine:400]  Intake/Output this shift: No intake/output data recorded.  Lab Results: Recent Labs    12/26/18 0323 12/27/18 0133 12/28/18 0235  WBC 1.5* 1.3* 1.5*  HGB 10.5* 10.4*  11.7*  HCT 30.3* 30.9* 36.1  PLT 80* 88* 85*   BMET Recent Labs    12/26/18 0323 12/27/18 0133 12/28/18 0235  NA 134* 133* 136  K 2.7* 3.8 3.9  CL 104 107 114*  CO2 24 20* 16*  GLUCOSE 77 106* 83  BUN 10 9 12   CREATININE 0.74 0.81 0.80  CALCIUM 7.5* 7.5* 7.7*   LFT Recent Labs    12/26/18 0323 12/27/18 0133 12/28/18 0235  PROT 4.7* 5.1* 5.2*  ALBUMIN 1.8* 2.0* 2.1*  AST 386* 398* 433*  ALT 153* 152* 159*  ALKPHOS 98 105 112  BILITOT 0.5 0.6 0.6     ASSESMENT:   *    Abnormal transaminases.  Central hepatic cyst without abnormal biliary tree, no gallstones, normal pancreas and PD per CT scan CT head 8/20: Stable atrophy and chronic small vessel white matter ischemia.  Stable, small left parafalcine meningio ANA negative.  Smooth muscle IgG negative.  Mitochondrial Ab negative. CMV IgM negative.  EBV IgM negative.  EBV IgG's elevated (remote, and active infection per Dr. Megan Salon). .   Transaminases continue to steadily climb.   *      Anorexia, FTT. Pt/family refusing megace, ordered last week.  Refusing all oral meds and nutritional supplements.  Interestingly allowing blood draws.    ? Of oral candidiasis, refusing nystatin.  Diflucan not favored in  setting elevated LFTs.   Dtr did not want anti-depressants started (which Pall care favored) due to no clear indication pt sxs are due to depression.  Psychiatrist had offered Remeron.     Strongly suspect there is an element of depression at play here.    *       Pancytopenia.  Heme evaluated 8/27, now signed off.  Willing to see as outpt.    *        Chronic Plavix.  Last dose was 8/29, refused med since then    PLAN   *   Radiology will reattempt MRI with daughter accompanying the patient later today. Given her response to MRI attempt, no hopes for her receptiveness to a NG feeding tube.      *   Dr Starla Link introduced idea of coretrak FT to pt's dtr on 9/1, dtr wanted "to think about it"    Azucena Freed   12/28/2018, 11:26 AM Phone [phone]

## 2018-12-28 NOTE — Progress Notes (Signed)
   12/28/18 1806  Clinical Encounter Type  Visited With Patient and family together  Visit Type Initial  Referral From Nurse  Consult/Referral To Chaplain  Spiritual Encounters  Spiritual Needs Prayer  Stress Factors  Family Stress Factors Lack of knowledge  This chaplain responded to referral from Unit Sec-Pam for Pt. prayer.  The Pt. requested  Chaplain-Ray. Ray is not available for a spiritual care visit at this time.  A referral will be made for Ray to F/U on Wednesday.  This chaplain introduced herself to the Pt. and Pt. daughter-Angela.  [name] described the Pt. affect as a 360 degree turn from the Pt. normal. The daughter described the Pt. as a very spiritual person. The chaplain prayed with the Pt. and Angela at the bedside before leaving the room. The chaplain is available for F/U spiritual care as needed.

## 2018-12-28 NOTE — Progress Notes (Signed)
Spoke with Dr. Fuller Plan and night shift RN aware. Pt came down around 7:45pm and was prepared for MRI, daughter present. Pt would not let daughter or techs place ear plugs for test. Unable to do MRI without ear plugs. Also made RN aware pt daughter concerned for blood clots in pts right leg because of pain.

## 2018-12-28 NOTE — Progress Notes (Signed)
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  CT of the abdomen and pelvis was negative for acute intra-abdominal or pelvic abnormality.  She was found to have mildly elevated LFTs along with hyponatremia and pancytopenia.  She was given IV fluids.  ID and GI were consulted.  Palliative care was subsequently consulted for goals of care discussion.  Assessment & Plan:   Principal Problem:   Evaluation by psychiatric service required Active Problems:   Pancytopenia ([place])   Hyponatremia   Protein calorie malnutrition (HCC)   History of TIA (transient ischemic attack)   Generalized weakness   Pressure injury of skin   Transaminitis   Malnutrition of moderate degree   Generalized weakness Moderate protein calorie malnutrition Adult failure to thrive Hypoalbuminemia Patient now representing to the hospital with weakness, and failure to thrive with decreased oral intake.  CT head negative for acute cranial abnormality.  MR brain no evidence of acute stroke.  Analysis unrevealing.  Unclear etiology, but there was thought to be a component of depression and psychiatry was consulted with recommendations of initiation of antidepressant therapy, but patient nor daughter interested at this time. --PT/OT recommends SNF, patient/daughter declined --Continue to encourage increased oral intake --Continue IV fluids --Continue Megace for appetite stimulation --Discussed once again patient may require NG tube for tube feeds given her poor oral intake, awaiting results of MRCP --Overall prognosis has been discussed by multiple  providers which is guarded to poor.  Palliative care consultation evaluation appreciated.  Patient's daughter at this time wants aggressive treatment and is not interested in palliative care discussions. Because of her continued very poor intake, unclear if patient will get better with current treatment modalities.  Odynophagia with possible oropharyngeal candidiasis --Unable to use Diflucan because of increased LFTs.  Nystatin orally has been ordered but patient refuses all her meds.  GI following.  GI might have to consider upper GI endoscopy.  Elevated LFTs Patient presenting with notable elevated transaminitis.  Abdominal exam is benign.  Hepatitis panel negative. EBV and CMV IgM are negative.  Right upper quadrant ultrasound only showed gallbladder sludging.  CT abdomen/pelvis with no evidence of acute intra-abdominal or pelvic abnormality but with diffuse fluid within the colon suggestive of possible enteritis versus diarrheal illness. --Gastroenterology following, appreciate assistance --LFTs still trending up slightly. -GI recommends MRCP for better imaging of the hepatic parenchyma.  Pancytopenia Evaluated by hematology. Patient has been started on vitamin B12 parenterally as she had low levels of vitamin B12 during last admission but vitamin B12 level normal this admission.  No improvement with empiric vitamin B12 supplementation. --Outpatient follow-up with hematology.  No plans for bone marrow biopsy during this admission.  Hyponatremia --Sodium 128 in the ED.  136 today.    Hypokalemia --Improved. K 3.9 today  Hypomagnesemia - improved  History of TIA  Patient has not received Plavix since 12/24/2018 as she has refused.  Stopped Plavix for now for possible need for EGD/liver biopsy or any sort of invasive procedure. --continue aspirin.  Stage II sacral pressure injury: Present on admission --continue wound care as per wound care consult recommendations.  Generalized  deconditioning PT recommends SNF placement.  Daughter thinks that patient will get better and will be able to go home.   DVT prophylaxis: SCDs Code Status: Full code Family Communication: Discussed plan of care with patient's daughter extensively at bedside this afternoon Disposition Plan: Continue inpatient, pending MRCP; poor/grim prognosis as patient has not been eating or taking any of her medicines.   Consultants:   Gastroenterology, [place]  Infectious disease  Hematology  Palliative care  Procedures: None   Antimicrobials: None   Subjective: Patient seen and examined at bedside, resting comfortably.  We will not open her mouth.  States that she does not want to be bothered.  Refused MRI yesterday afternoon.  Daughter present at bedside.  States that her mother has been declining this is now her second hospitalization for dehydration and apparent adult failure to thrive.  States previous to these 2 recent hospitalizations, her mother was up cooking and taking care of all of her ADLs on her own, and she does not understand why there is been such an acute change.  Patient without any other complaints or concerns at this time.  Denies chest pain, no shortness of breath.  No acute events overnight per nursing staff.  Objective: Vitals:   12/27/18 1358 12/27/18 2028 12/28/18 0457 12/28/18 1504  BP: 112/64 114/70 120/74 93/74  Pulse: 79 91 90 80  Resp: (!) '22 19 16 16  ' Temp: 98.3 F (36.8 C) (!) 97.4 F (36.3 C) 98.4 F (36.9 C) 97.9 F (36.6 C)  TempSrc: Oral Oral Oral Oral  SpO2: 100%  98% 100%  Weight:      Height:        Intake/Output Summary (Last 24 hours) at 12/28/2018 1737 Last data filed at 12/27/2018 1810 Gross per 24 hour  Intake 946.6 ml  Output 400 ml  Net 546.6 ml   Filed Weights   12/24/18 0500 12/25/18 0500 12/26/18 0500  Weight: 50.3 kg 50 kg 61 kg    Examination:  General exam: Appears calm and comfortable, thin and cachectic in appearance  Respiratory system: Clear to auscultation. Respiratory effort normal. Cardiovascular system: S1 & S2 heard, RRR. No JVD, murmurs, rubs, gallops or clicks. No pedal edema. Gastrointestinal system: Abdomen is nondistended, soft and nontender. No organomegaly or masses felt. Normal bowel sounds heard. Central nervous system: Sleeping but arousable and alert Extremities: Symmetric 5 x 5 power. Skin: No rashes, lesions or ulcers Psychiatry: Depressed mood, flat affect    Data Reviewed: I have personally reviewed following labs and imaging studies  CBC: Recent Labs  Lab 12/24/18 0154 12/25/18 0302 12/26/18 0323 12/27/18 0133 12/28/18 0235  WBC 1.6* 1.7* 1.5* 1.3* 1.5*  NEUTROABS 1.0* 1.1* 0.8* 0.7* 0.9*  HGB 10.1* 10.1* 10.5* 10.4* 11.7*  HCT 29.1* 28.8* 30.3* 30.9* 36.1  MCV 89.5 89.2 91.0 93.6 96.0  PLT 94* 90* 80* 88* 85*   Basic Metabolic Panel: Recent Labs  Lab 12/24/18 0154 12/25/18 0302 12/26/18 0323 12/27/18 0133 12/28/18 0235  NA 135 134* 134* 133* 136  K 3.0* 2.7* 2.7* 3.8 3.9  CL 111 104 104 107 114*  CO2 19* 24 24 20* 16*  GLUCOSE 107* 107* 77 106* 83  BUN '10 8 10 9 12  ' CREATININE 0.69 0.57 0.74 0.81 0.80  CALCIUM 7.8* 7.5* 7.5* 7.5* 7.7*  MG 1.7 1.6* 2.3 2.1 2.2   GFR: Estimated Creatinine Clearance: 41.8 mL/min (by C-G formula based on SCr of 0.8 mg/dL). Liver Function Tests: Recent Labs  Lab 12/24/18 0154 12/25/18 0302 12/26/18 0323 12/27/18  0133 12/28/18 0235  AST 327* 356* 386* 398* 433*  ALT 145* 153* 153* 152* 159*  ALKPHOS 82 83 98 105 112  BILITOT 0.6 0.7 0.5 0.6 0.6  PROT 4.8* 4.8* 4.7* 5.1* 5.2*  ALBUMIN 1.9* 1.9* 1.8* 2.0* 2.1*   No results for input(s): LIPASE, AMYLASE in the last 168 hours. No results for input(s): AMMONIA in the last 168 hours. Coagulation Profile: No results for input(s): INR, PROTIME in the last 168 hours. Cardiac Enzymes: No results for input(s): CKTOTAL, CKMB, CKMBINDEX, TROPONINI in the last 168 hours. BNP  (last 3 results) No results for input(s): PROBNP in the last 8760 hours. HbA1C: No results for input(s): HGBA1C in the last 72 hours. CBG: Recent Labs  Lab 12/24/18 0749 12/25/18 0747 12/26/18 0828 12/26/18 0902 12/27/18 0827  GLUCAP 97 104* 63* 111* 153*   Lipid Profile: No results for input(s): CHOL, HDL, LDLCALC, TRIG, CHOLHDL, LDLDIRECT in the last 72 hours. Thyroid Function Tests: No results for input(s): TSH, T4TOTAL, FREET4, T3FREE, THYROIDAB in the last 72 hours. Anemia Panel: No results for input(s): VITAMINB12, FOLATE, FERRITIN, TIBC, IRON, RETICCTPCT in the last 72 hours. Sepsis Labs: No results for input(s): PROCALCITON, LATICACIDVEN in the last 168 hours.  Recent Results (from the past 240 hour(s))  Urine Culture     Status: Abnormal   Collection Time: 12/19/18  4:12 AM   Specimen: Urine, Clean Catch  Result Value Ref Range Status   Specimen Description URINE, CLEAN CATCH  Final   Special Requests NONE  Final   Culture (A)  Final    <10,000 COLONIES/mL INSIGNIFICANT GROWTH Performed at [facility], [address], [place], [place] [address]    Report Status 12/21/2018 FINAL  Final  Blood culture (routine x 2)     Status: None   Collection Time: 12/20/18 12:12 AM   Specimen: BLOOD RIGHT FOREARM  Result Value Ref Range Status   Specimen Description BLOOD RIGHT FOREARM  Final   Special Requests   Final    BOTTLES DRAWN AEROBIC AND ANAEROBIC Blood Culture adequate volume   Culture   Final    NO GROWTH 5 DAYS Performed at [facility], [place] [address], [place], [place] [address]    Report Status 12/25/2018 FINAL  Final  Blood culture (routine x 2)     Status: None   Collection Time: 12/20/18 12:43 AM   Specimen: BLOOD  Result Value Ref Range Status   Specimen Description BLOOD RIGHT ANTECUBITAL  Final   Special Requests   Final    BOTTLES DRAWN AEROBIC AND ANAEROBIC Blood Culture adequate volume   Culture   Final    NO GROWTH 5 DAYS  Performed at [facility], [place] [address] [place], [place] [address]    Report Status 12/25/2018 FINAL  Final  SARS CORONAVIRUS 2 (TAT 6-12 HRS) Nasal Swab Aptima Multi Swab     Status: None   Collection Time: 12/20/18  3:52 AM   Specimen: Aptima Multi Swab; Nasal Swab  Result Value Ref Range Status   SARS Coronavirus 2 NEGATIVE NEGATIVE Final    Comment: (NOTE) SARS-CoV-2 target nucleic acids are NOT DETECTED. The SARS-CoV-2 RNA is generally detectable in upper and lower respiratory specimens during the acute phase of infection. Negative results do not preclude SARS-CoV-2 infection, do not rule out co-infections with other pathogens, and should not be used as the sole basis for treatment or other patient management decisions. Negative results must be combined with  clinical observations, patient history, and epidemiological information. The expected result is Negative. Fact Sheet for Patients: SugarRoll.be Fact Sheet for Healthcare Providers: https://www.woods-mathews.com/ This test is not yet approved or cleared by the [place] FDA and  has been authorized for detection and/or diagnosis of SARS-CoV-2 by FDA under an Emergency Use Authorization (EUA). This EUA will remain  in effect (meaning this test can be used) for the duration of the COVID-19 declaration under Section 56 4(b)(1) of the Act, 21 U.S.C. section 360bbb-3(b)(1), unless the authorization is terminated or revoked sooner. Performed at [facility], [place] [address] [place], [place] [address]   Culture, group A strep     Status: None   Collection Time: 12/25/18 11:27 AM   Specimen: Throat  Result Value Ref Range Status   Specimen Description THROAT  Final   Special Requests NONE  Final   Culture   Final    NO GROUP A STREP (S.PYOGENES) ISOLATED Performed at [facility], [place] [address] [place], [place] [address]    Report Status 12/28/2018 FINAL   Final         Radiology Studies: No results found.      Scheduled Meds: . aspirin EC  81 mg Oral Daily  . feeding supplement  1 Container Oral TID BM  . feeding supplement (PRO-STAT SUGAR FREE 64)  30 mL Oral BID WC  . folic acid  1 mg Oral Daily  . Gerhardt's butt cream   Topical TID  . megestrol  400 mg Oral Daily  . multivitamin with minerals  1 tablet Oral Daily  . nystatin  5 mL Oral QID  . pantoprazole  20 mg Oral Daily  . sodium chloride flush  3 mL Intravenous Q12H  . sodium chloride flush  3 mL Intravenous Q12H   Continuous Infusions: . sodium chloride    . dextrose 5 % and 0.9 % NaCl with KCl 40 mEq/L 75 mL/hr at 12/28/18 1044     LOS: 8 days    Time spent: 39 minutes spent on chart review, discussion with nursing staff, consultants, personally reviewing all imaging studies and labs, updating family and interview/physical exam; more than 50% of that time was spent in counseling and/or coordination of care.    Elveria Lauderbaugh J [place] DO [facility] Pager [phone]  If 7PM-7AM, please contact night-coverage [email or web address] Password [facility] 12/28/2018, 5:37 PM

## 2018-12-29 ENCOUNTER — Encounter (HOSPITAL_COMMUNITY): Payer: Self-pay | Admitting: General Practice

## 2018-12-29 ENCOUNTER — Inpatient Hospital Stay (HOSPITAL_COMMUNITY): Payer: Medicare Other

## 2018-12-29 DIAGNOSIS — R740 Nonspecific elevation of levels of transaminase and lactic acid dehydrogenase [LDH]: Secondary | ICD-10-CM | POA: Diagnosis not present

## 2018-12-29 DIAGNOSIS — Z008 Encounter for other general examination: Secondary | ICD-10-CM | POA: Diagnosis not present

## 2018-12-29 DIAGNOSIS — E860 Dehydration: Secondary | ICD-10-CM

## 2018-12-29 DIAGNOSIS — E440 Moderate protein-calorie malnutrition: Secondary | ICD-10-CM | POA: Diagnosis not present

## 2018-12-29 HISTORY — DX: Dehydration: E86.0

## 2018-12-29 LAB — GLUCOSE, CAPILLARY: Glucose-Capillary: 111 mg/dL — ABNORMAL HIGH (ref 70–99)

## 2018-12-29 NOTE — Progress Notes (Signed)
          Daily Rounding Note  12/29/2018, 8:26 AM  LOS: 9 days   SUBJECTIVE:   Chief complaint: Elevated transaminases, AST >> ALT. Pt refused attempt # 2 MRCP.    Continues to decline meds and BS care.    Pt complains to dtr of R leg pain.    OBJECTIVE:         Vital signs in last 24 hours:    Temp:  [97.9 F (36.6 C)-98 F (36.7 C)] 98 F (36.7 C) (09/03 0519) Pulse Rate:  [80-92] 91 (09/03 0519) Resp:  [16-17] 16 (09/03 0519) BP: (93-99)/(74-76) 97/76 (09/03 0519) SpO2:  [96 %-100 %] 96 % (09/03 0519) Last BM Date: 12/29/18 Filed Weights   12/24/18 0500 12/25/18 0500 12/26/18 0500  Weight: 50.3 kg 50 kg 61 kg   General: Moaning.  Eyes are open seems to acknowledge my presence.  Cachectic. ENT: Would not open her mouth.  Skin at the lips is dry, cracking. Heart: RRR. Chest: No labored breathing or cough. Abdomen: Soft.  Bowel sounds hypoactive.  Not distended.  Groans as I palpate throughout the abdomen but no wincing or other evidence of discomfort. Extremities: Thin limbs, muscle wasting.  No edema.  No groaning when I lifted and put her legs or arms through limited range of motion Neuro/Psych: Not following commands.  Intake/Output from previous day: 09/02 0701 - 09/03 0700 In: 1688.6 [I.V.:1688.6] Out: -   Intake/Output this shift: No intake/output data recorded.  Lab Results: Recent Labs    12/27/18 0133 12/28/18 0235  WBC 1.3* 1.5*  HGB 10.4* 11.7*  HCT 30.9* 36.1  PLT 88* 85*   BMET Recent Labs    12/27/18 0133 12/28/18 0235  NA 133* 136  K 3.8 3.9  CL 107 114*  CO2 20* 16*  GLUCOSE 106* 83  BUN 9 12  CREATININE 0.81 0.80  CALCIUM 7.5* 7.7*   LFT Recent Labs    12/27/18 0133 12/28/18 0235  PROT 5.1* 5.2*  ALBUMIN 2.0* 2.1*  AST 398* 433*  ALT 152* 159*  ALKPHOS 105 112  BILITOT 0.6 0.6     ASSESMENT:   *    Elevated transaminases, AST >> ALT.   No source revealed at [place],  CTAP.   Serologic, lab autoimmune, viral) assays unrevealing.   ? Skeletal source of AST elevation?   *     Pancytopenia.  Heme evaluated and signed off.   Previous low B12 level normalized with supplementation.    *    Malnutrition, pt refusing meals and nutritional supplements.  Although mentioned in some notes, do not get sense pt has odynophagia.       *     right leg pain.  No hip fracture noted on intake xray.    *     Anorexia, FTT, Malnutrition, behavioral changes.  Continues to decline, refusing meds and nursing care.  Depression suspected, dtr did not want [name] started and pt likely to refuse this anyway.     PLAN   *  Not sure there is much to offer from GI perspective.   ? EGD?, Dr Tarri Glenn has not felt it offers much benefit and unlikely to clinch a dx or change mgt.    *   Note that up to present, dtr has hopes pt can make full recovery and not interested in palliative approach.       Azucena Freed  12/29/2018, 8:26 AM Phone [phone]

## 2018-12-29 NOTE — Plan of Care (Signed)
  Problem: Education: Goal: Knowledge of General Education information will improve Description: Including pain rating scale, medication(s)/side effects and non-pharmacologic comfort measures Outcome: Progressing   Problem: Health Behavior/Discharge Planning: Goal: Ability to manage health-related needs will improve Outcome: Progressing   Problem: Clinical Measurements: Goal: Ability to maintain clinical measurements within normal limits will improve Outcome: Progressing Goal: Will remain free from infection Outcome: Progressing Goal: Respiratory complications will improve Outcome: Progressing Goal: Cardiovascular complication will be avoided Outcome: Progressing   Problem: Coping: Goal: Level of anxiety will decrease Outcome: Progressing   Problem: Pain Managment: Goal: General experience of comfort will improve Outcome: Progressing   Problem: Safety: Goal: Ability to remain free from injury will improve Outcome: Progressing   Problem: Skin Integrity: Goal: Risk for impaired skin integrity will decrease Outcome: Progressing

## 2018-12-29 NOTE — Progress Notes (Signed)
Responded to referral to continue support to patient and daughter.  Daughter is only and doctors cant find out what's going on with patient.  Spent time listening to daughters concerns.  Provided emotional and spiritual support, guidance and ministry of presence.  Will follow as needed.  Jaclynn Major, [place], [facility], Pager [phone]

## 2018-12-29 NOTE — Progress Notes (Addendum)
Per daughter, patient c/o SOB while in bed, right lower pain, assessed right lower leg, noted no redness, normal temp., muscle stiffness, repositioned right leg, earlier O2Sat 97% on RA.  Pt. Assisted to [facility] and noted to have liquid BM and urine, NT did incontinent care, applied Butt cream, refused oral care and repositioned pt on bed.  Pt now resting on bed comfortably with both eyes closed. Will monitor

## 2018-12-29 NOTE — Progress Notes (Signed)
Occupational Therapy Treatment Patient Details Name: Sydney [name] MRN: [id number] DOB: 12-10-1930 Today's Date: 12/29/2018    History of present illness Sydney [name] is a 85 y.o. 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   OT comments  Pt making slow progress towards OT goals. Pt limited by pain and weakness this session requiring total assist for bed mobility to transition from supine >EOB. Pt stating "lay me down" while static sitting EOB. Pt declined ADLs at EOB and unable to follow commands to facilitate BLE/ BUE ROM, "stating everything hurts."   Follow Up Recommendations  SNF;Supervision/Assistance - 24 hour    Equipment Recommendations  Other (comment)(tbd to next venue)    Recommendations for Other Services      Precautions / Restrictions Precautions Precautions: Fall Precaution Comments: had near syncopal episode on BSC at home before coming in Restrictions Weight Bearing Restrictions: No       Mobility Bed Mobility Overal bed mobility: Needs Assistance Bed Mobility: Supine to Sit     Supine to sit: Total assist     General bed mobility comments: total assist to initiate moving legs; elevating trunk; scooting hips to EOB; pt did push back against therapist when moving legs towards EOB  Transfers                 General transfer comment: unable to perform transfer this session, pt actively resisting and continues to report "lay me back down"    Balance Overall balance assessment: Needs assistance Sitting-balance support: Bilateral upper extremity supported;Feet supported Sitting balance-Leahy Scale: Poor Sitting balance - Comments: fluctuating between MIN - MOD assist for static  sitting d/t heavy posterior lean when pt states, "lay me down"                                   ADL either performed or assessed with clinical judgement   ADL Overall ADL's : Needs assistance/impaired   Eating/Feeding Details (indicate cue type and reason): not eating/ drinking anything currently     Upper Body Bathing: Total assistance;Sitting Upper Body Bathing Details (indicate cue type and reason): total assist from daughter to wash back and face while therapist supported pt in EOB static sitting               Toilet Transfer Details (indicate cue type and reason): declined OOB transfer to recliner/ [facility]         Functional mobility during ADLs: Maximal assistance;Total assistance General ADL Comments: pt limited by generalized weakness and all over pain; during total assist transfer from supine>EOB pt c/o everything hurting; suspect pt limited d/t not having eaten or drank in 3 days     Vision   Additional Comments: pt kept eyes closed the majority of session   Perception     Praxis      Cognition Arousal/Alertness: Awake/alert Behavior During Therapy: Restless;Agitated Overall Cognitive Status: Impaired/Different from baseline                                 General Comments: pt continues to repeat throughout session "just lay me back down' despite family members being present encouraging pt  Exercises     Shoulder Instructions       General Comments      Pertinent Vitals/ Pain       Pain Assessment: Faces Faces Pain Scale: Hurts even more Pain Location: BUE/BLE Pain Descriptors / Indicators: Grimacing;Moaning;Guarding;Constant;Crying;Sore Pain Intervention(s): Monitored during session;Limited activity within patient's tolerance;Repositioned  Home Living                                          Prior Functioning/Environment              Frequency  Min 2X/week        Progress Toward  Goals  OT Goals(current goals can now be found in the care plan section)  Progress towards OT goals: Not progressing toward goals - comment(pt limited by poor nutritional intake and overall weakness/ pain)  Acute Rehab OT Goals Time For Goal Achievement: 01/05/19  Plan      Co-evaluation                 AM-PAC OT "6 Clicks" Daily Activity     Outcome Measure   Help from another person eating meals?: Total Help from another person taking care of personal grooming?: Total Help from another person toileting, which includes using toliet, bedpan, or urinal?: Total Help from another person bathing (including washing, rinsing, drying)?: Total Help from another person to put on and taking off regular upper body clothing?: Total Help from another person to put on and taking off regular lower body clothing?: Total 6 Click Score: 6    End of Session    OT Visit Diagnosis: Muscle weakness (generalized) (M62.81);Unsteadiness on feet (R26.81);Other symptoms and signs involving cognitive function   Activity Tolerance Patient limited by pain;Patient limited by fatigue;Other (comment)(suspect limitation d/t poor nutritional intake)   Patient Left     Nurse Communication Mobility status        Time: 1350-1406 OT Time Calculation (min): 16 min  Charges: OT General Charges $OT Visit: 1 Visit OT Treatments $Therapeutic Activity: 8-22 mins     Ihor Gully, COTA/L 12/29/2018, 3:34 PM  [phone]

## 2018-12-29 NOTE — Progress Notes (Signed)
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  CT of the abdomen and pelvis was negative for acute intra-abdominal or pelvic abnormality.  She was found to have mildly elevated LFTs along with hyponatremia and pancytopenia.  She was given IV fluids.  ID and GI were consulted.  Palliative care was subsequently consulted for goals of care discussion.  Assessment & Plan:   Principal Problem:   Evaluation by psychiatric service required Active Problems:   Pancytopenia ([place])   Hyponatremia   Protein calorie malnutrition (HCC)   History of TIA (transient ischemic attack)   Generalized weakness   Pressure injury of skin   Transaminitis   Malnutrition of moderate degree   Generalized weakness Moderate protein calorie malnutrition Adult failure to thrive Hypoalbuminemia Patient now representing to the hospital with weakness, and failure to thrive with decreased oral intake.  CT head negative for acute cranial abnormality.  MR brain no evidence of acute stroke.  Analysis unrevealing.  Unclear etiology, but there was thought to be a component of depression and psychiatry was consulted with recommendations of initiation of antidepressant therapy, but patient nor daughter interested at this time. --PT/OT recommends SNF, patient/daughter declined --Continue to encourage increased oral intake --Continue IV fluids --Continue Megace for appetite stimulation, patient currently refusing --Discussed once again patient may require NG tube for tube feeds given her poor oral intake --Overall prognosis has been discussed by multiple  providers which is guarded to poor.  Palliative care consultation evaluation appreciated.  Because of her continued very poor intake, unclear if patient will get better with current treatment modalities.  This is been discussed with patient's daughter multiple times once again today.  She is awaiting for other family to arrive for further decisions regarding tube feeds. --Will reconsult palliative care today for assistance with further goals of care and medical decision making  Odynophagia with possible oropharyngeal candidiasis --Unable to use Diflucan because of increased LFTs.  Nystatin orally has been ordered but patient refuses all her meds.  GI following, appreciate input.  Elevated LFTs Patient presenting with notable elevated transaminitis.  Abdominal exam is benign.  Hepatitis panel negative. EBV and CMV IgM are negative.  Right upper quadrant ultrasound only showed gallbladder sludging.  CT abdomen/pelvis with no evidence of acute intra-abdominal or pelvic abnormality but with diffuse fluid within the colon suggestive of possible enteritis versus diarrheal illness. --Gastroenterology following, appreciate assistance --LFTs still trending up slightly; but patient refused lab draws today. --GI recommends MRCP for better imaging of the hepatic parenchyma, but patient has refused now on 2 occasions --Believes that additional etiology of them proportional AST versus ALT could be from a skeletal muscle release given that she is been relatively bedbound during this hospitalization.  Pancytopenia Evaluated by hematology. Patient has been started on vitamin B12 parenterally as she had low levels of vitamin B12 during last admission but vitamin B12 level normal this admission.  No improvement with empiric vitamin B12 supplementation. --Outpatient follow-up with hematology.  No plans for bone marrow biopsy during this admission.  Hyponatremia --Sodium 128 in the ED.  136 on 12/28/2018.  Refuse lab  draws today.  Hypokalemia Hypomagnesemia --Improved. K 3.9  today  History of TIA  Patient has not received Plavix since 12/24/2018 as she has refused.  Stopped Plavix for now for possible need for EGD/liver biopsy or any sort of invasive procedure. --continue aspirin; although patient refusing all medications at this time  Stage II sacral pressure injury: Present on admission --continue wound care as per wound care consult recommendations.  Generalized deconditioning PT recommends SNF placement.  Daughter thinks that patient will get better and will be able to go home.   DVT prophylaxis: SCDs Code Status: Full code Family Communication: Discussed plan of care with patient's daughter extensively at bedside this morning and once again this afternoon. Disposition Plan: Continue inpatient, pending MRCP; poor/grim prognosis as patient has not been eating or taking any of her medicines.   Consultants:   Gastroenterology, [place]  Infectious disease  Hematology  Palliative care  Procedures: None   Antimicrobials: None   Subjective:  Patient seen and examined at bedside, resting comfortably.  Refusing any lab draws, medications, or other interventions.  Declined MRCP now on 2 occasions.  Patient refuses to comply with any physical examination.  Discussed with daughter extensively this morning and once again this afternoon that her mother continues to decline and refusing all intervention and need to consider at this point a more palliative approach or possible start of tube feeds given her poor nutritional status.  Discussion with nursing staff that has been with the patient for multiple days, there is been no appreciable change in her status during this time; even though her daughter reports that she was eating and more mobile just a few days ago which is inconsistent with nursing notes and previous physician evaluations during this hospitalization. No acute events overnight per  nursing staff.  Objective: Vitals:   12/28/18 0457 12/28/18 1504 12/28/18 2044 12/29/18 0519  BP: 1'20/74 93/74 99/76 ' 97/76  Pulse: 90 80 92 91  Resp: '16 16 17 16  ' Temp: 98.4 F (36.9 C) 97.9 F (36.6 C) 97.9 F (36.6 C) 98 F (36.7 C)  TempSrc: Oral Oral Oral Axillary  SpO2: 98% 100% 97% 96%  Weight:      Height:        Intake/Output Summary (Last 24 hours) at 12/29/2018 1521 Last data filed at 12/29/2018 0655 Gross per 24 hour  Intake 1688.63 ml  Output --  Net 1688.63 ml   Filed Weights   12/24/18 0500 12/25/18 0500 12/26/18 0500  Weight: 50.3 kg 50 kg 61 kg    Examination:  General exam: Appears calm and comfortable, thin and cachectic in appearance Respiratory system: Clear to auscultation. Respiratory effort normal. Cardiovascular system: S1 & S2 heard, RRR. No JVD, murmurs, rubs, gallops or clicks. No pedal edema. Gastrointestinal system: Abdomen is nondistended, soft and nontender. No organomegaly or masses felt. Normal bowel sounds heard. Central nervous system: Sleeping but arousable and alert Extremities: Symmetric 5 x 5 power. Skin: No rashes, lesions or ulcers Psychiatry: Depressed mood, flat affect    Data Reviewed: I have personally reviewed following labs and imaging studies  CBC: Recent Labs  Lab 12/24/18 0154 12/25/18 0302 12/26/18 0323 12/27/18 0133 12/28/18 0235  WBC 1.6* 1.7* 1.5* 1.3* 1.5*  NEUTROABS 1.0* 1.1* 0.8* 0.7* 0.9*  HGB 10.1* 10.1* 10.5* 10.4* 11.7*  HCT 29.1* 28.8* 30.3* 30.9* 36.1  MCV 89.5 89.2 91.0 93.6 96.0  PLT 94* 90* 80* 88* 85*   Basic Metabolic Panel: Recent Labs  Lab 12/24/18 0154 12/25/18 0302 12/26/18 0323 12/27/18 0133 12/28/18 0235  NA  135 134* 134* 133* 136  K 3.0* 2.7* 2.7* 3.8 3.9  CL 111 104 104 107 114*  CO2 19* 24 24 20* 16*  GLUCOSE 107* 107* 77 106* 83  BUN '10 8 10 9 12  ' CREATININE 0.69 0.57 0.74 0.81 0.80  CALCIUM 7.8* 7.5* 7.5* 7.5* 7.7*  MG 1.7 1.6* 2.3 2.1 2.2   GFR: Estimated  Creatinine Clearance: 41.8 mL/min (by C-G formula based on SCr of 0.8 mg/dL). Liver Function Tests: Recent Labs  Lab 12/24/18 0154 12/25/18 0302 12/26/18 0323 12/27/18 0133 12/28/18 0235  AST 327* 356* 386* 398* 433*  ALT 145* 153* 153* 152* 159*  ALKPHOS 82 83 98 105 112  BILITOT 0.6 0.7 0.5 0.6 0.6  PROT 4.8* 4.8* 4.7* 5.1* 5.2*  ALBUMIN 1.9* 1.9* 1.8* 2.0* 2.1*   No results for input(s): LIPASE, AMYLASE in the last 168 hours. No results for input(s): AMMONIA in the last 168 hours. Coagulation Profile: No results for input(s): INR, PROTIME in the last 168 hours. Cardiac Enzymes: No results for input(s): CKTOTAL, CKMB, CKMBINDEX, TROPONINI in the last 168 hours. BNP (last 3 results) No results for input(s): PROBNP in the last 8760 hours. HbA1C: No results for input(s): HGBA1C in the last 72 hours. CBG: Recent Labs  Lab 12/25/18 0747 12/26/18 0828 12/26/18 0902 12/27/18 0827 12/29/18 0847  GLUCAP 104* 63* 111* 153* 111*   Lipid Profile: No results for input(s): CHOL, HDL, LDLCALC, TRIG, CHOLHDL, LDLDIRECT in the last 72 hours. Thyroid Function Tests: No results for input(s): TSH, T4TOTAL, FREET4, T3FREE, THYROIDAB in the last 72 hours. Anemia Panel: No results for input(s): VITAMINB12, FOLATE, FERRITIN, TIBC, IRON, RETICCTPCT in the last 72 hours. Sepsis Labs: No results for input(s): PROCALCITON, LATICACIDVEN in the last 168 hours.  Recent Results (from the past 240 hour(s))  Blood culture (routine x 2)     Status: None   Collection Time: 12/20/18 12:12 AM   Specimen: BLOOD RIGHT FOREARM  Result Value Ref Range Status   Specimen Description BLOOD RIGHT FOREARM  Final   Special Requests   Final    BOTTLES DRAWN AEROBIC AND ANAEROBIC Blood Culture adequate volume   Culture   Final    NO GROWTH 5 DAYS Performed at [facility], [address], [place], [place] [address]    Report Status 12/25/2018 FINAL  Final  Blood culture (routine x 2)     Status: None    Collection Time: 12/20/18 12:43 AM   Specimen: BLOOD  Result Value Ref Range Status   Specimen Description BLOOD RIGHT ANTECUBITAL  Final   Special Requests   Final    BOTTLES DRAWN AEROBIC AND ANAEROBIC Blood Culture adequate volume   Culture   Final    NO GROWTH 5 DAYS Performed at [facility], [place] [address], [place], [place] [address]    Report Status 12/25/2018 FINAL  Final  SARS CORONAVIRUS 2 (TAT 6-12 HRS) Nasal Swab Aptima Multi Swab     Status: None   Collection Time: 12/20/18  3:52 AM   Specimen: Aptima Multi Swab; Nasal Swab  Result Value Ref Range Status   SARS Coronavirus 2 NEGATIVE NEGATIVE Final    Comment: (NOTE) SARS-CoV-2 target nucleic acids are NOT DETECTED. The SARS-CoV-2 RNA is generally detectable in upper and lower respiratory specimens during the acute phase of infection. Negative results do not preclude SARS-CoV-2 infection, do not rule out co-infections with other pathogens, and should not be used as the sole basis for  treatment or other patient management decisions. Negative results must be combined with clinical observations, patient history, and epidemiological information. The expected result is Negative. Fact Sheet for Patients: SugarRoll.be Fact Sheet for Healthcare Providers: https://www.woods-mathews.com/ This test is not yet approved or cleared by the [place] FDA and  has been authorized for detection and/or diagnosis of SARS-CoV-2 by FDA under an Emergency Use Authorization (EUA). This EUA will remain  in effect (meaning this test can be used) for the duration of the COVID-19 declaration under Section 56 4(b)(1) of the Act, 21 U.S.C. section 360bbb-3(b)(1), unless the authorization is terminated or revoked sooner. Performed at [facility], [place] [address] [place], [place] [address]   Culture, group A strep     Status: None   Collection Time: 12/25/18 11:27 AM   Specimen:  Throat  Result Value Ref Range Status   Specimen Description THROAT  Final   Special Requests NONE  Final   Culture   Final    NO GROUP A STREP (S.PYOGENES) ISOLATED Performed at [facility], [place] [address], [place], [place] [address]    Report Status 12/28/2018 FINAL  Final         Radiology Studies: No results found.      Scheduled Meds:  aspirin EC  81 mg Oral Daily   feeding supplement  1 Container Oral TID BM   feeding supplement (PRO-STAT SUGAR FREE 64)  30 mL Oral BID WC   folic acid  1 mg Oral Daily   Gerhardt's butt cream   Topical TID   megestrol  400 mg Oral Daily   multivitamin with minerals  1 tablet Oral Daily   nystatin  5 mL Oral QID   pantoprazole  20 mg Oral Daily   sodium chloride flush  3 mL Intravenous Q12H   sodium chloride flush  3 mL Intravenous Q12H   Continuous Infusions:  sodium chloride     dextrose 5 % and 0.9 % NaCl with KCl 40 mEq/L 75 mL/hr at 12/29/18 1521     LOS: 9 days    Time spent: 36 minutes spent on chart review, discussion with nursing staff, consultants, personally reviewing all imaging studies and labs, updating family and interview/physical exam; more than 50% of that time was spent in counseling and/or coordination of care.    Gradyn Shein J [place] DO [facility] Pager [phone]  If 7PM-7AM, please contact night-coverage [email or web address] Password [email or web address] 12/29/2018, 3:21 PM

## 2018-12-29 NOTE — Progress Notes (Signed)
Per [facility] okay for up to 4 family members to visit pt with 2 in the room at a time. Pt's daughter, Sydney [name], aware and verbalized understanding. Will continue to monitor.

## 2018-12-30 ENCOUNTER — Encounter: Payer: Self-pay | Admitting: Cardiology

## 2018-12-30 DIAGNOSIS — R740 Nonspecific elevation of levels of transaminase and lactic acid dehydrogenase [LDH]: Secondary | ICD-10-CM | POA: Diagnosis not present

## 2018-12-30 DIAGNOSIS — Z008 Encounter for other general examination: Secondary | ICD-10-CM | POA: Diagnosis not present

## 2018-12-30 LAB — COMPREHENSIVE METABOLIC PANEL
ALT: 114 U/L — ABNORMAL HIGH (ref 0–44)
AST: 297 U/L — ABNORMAL HIGH (ref 15–41)
Albumin: 2 g/dL — ABNORMAL LOW (ref 3.5–5.0)
Alkaline Phosphatase: 126 U/L (ref 38–126)
Anion gap: 2 — ABNORMAL LOW (ref 5–15)
BUN: 8 mg/dL (ref 8–23)
CO2: 15 mmol/L — ABNORMAL LOW (ref 22–32)
Calcium: 7.9 mg/dL — ABNORMAL LOW (ref 8.9–10.3)
Chloride: 124 mmol/L — ABNORMAL HIGH (ref 98–111)
Creatinine, Ser: 0.75 mg/dL (ref 0.44–1.00)
GFR calc Af Amer: >60 mL/min (ref >60)
GFR calc non Af Amer: >60 mL/min (ref >60)
Glucose, Bld: 103 mg/dL — ABNORMAL HIGH (ref 70–99)
Potassium: 5 mmol/L (ref 3.5–5.1)
Sodium: 141 mmol/L (ref 135–145)
Total Bilirubin: 0.6 mg/dL (ref 0.3–1.2)
Total Protein: 5 g/dL — ABNORMAL LOW (ref 6.5–8.1)

## 2018-12-30 LAB — CBC
HCT: 32.6 % — ABNORMAL LOW (ref 36.0–46.0)
Hemoglobin: 10.4 g/dL — ABNORMAL LOW (ref 12.0–15.0)
MCH: 30.8 pg (ref 26.0–34.0)
MCHC: 31.9 g/dL (ref 30.0–36.0)
MCV: 96.4 fL (ref 80.0–100.0)
Platelets: 90 10*3/uL — ABNORMAL LOW (ref 150–400)
RBC: 3.38 MIL/uL — ABNORMAL LOW (ref 3.87–5.11)
RDW: 15.7 % — ABNORMAL HIGH (ref 11.5–15.5)
WBC: 2.2 10*3/uL — ABNORMAL LOW (ref 4.0–10.5)
nRBC: 0 % (ref 0.0–0.2)

## 2018-12-30 LAB — CK: Total CK: 254 U/L — ABNORMAL HIGH (ref 38–234)

## 2018-12-30 MED ORDER — LATANOPROST 0.005 % OP SOLN
1.0000 [drp] | Freq: Every day | OPHTHALMIC | Status: DC
  Administered 2018-12-30 – 2019-01-30 (×31): 1 [drp] via OPHTHALMIC
  Filled 2018-12-30 (×2): qty 2.5

## 2018-12-30 NOTE — Progress Notes (Signed)
Pt daughter agreed to have tube feeding will inform Dr. [place]

## 2018-12-30 NOTE — Progress Notes (Signed)
PT Cancellation Note  Patient Details Name: Sydney [name] MRN: [id number] DOB: [birth year]   Cancelled Treatment:    Reason Eval/Treat Not Completed: Patient not medically ready.  Palliative care asked therapy to hold today while the family and palliative make decisions.  Pt fatigued and agitated today. Will see next week as appropriate. 12/30/2018  Donnella Sham, [facility] [phone]  (pager) [phone]  (office)   Tessie Fass [place] 12/30/2018, 5:36 PM

## 2018-12-30 NOTE — Progress Notes (Signed)
Daily Rounding Note  12/30/2018, 9:57 AM  LOS: 10 days   SUBJECTIVE:   Chief complaint:     Continues to refuse medications.  Allows blood draws. C/O abdominal pain.  Stools are brown, soft.   OBJECTIVE:         Vital signs in last 24 hours:    Temp:  [97.9 F (36.6 C)-98 F (36.7 C)] 98 F (36.7 C) (09/04 0501) Pulse Rate:  [84-90] 90 (09/04 0501) Resp:  [19] 19 (09/04 0501) BP: (112-125)/(64-70) 112/70 (09/04 0501) SpO2:  [97 %-99 %] 97 % (09/04 0501) Last BM Date: 12/29/18 Filed Weights   12/24/18 0500 12/25/18 0500 12/26/18 0500  Weight: 50.3 kg 50 kg 61 kg   General: Cachectic, ill looking, little interaction. ENT: Opened her mouth a little bit for me today.  She has candidiasis looking ivory coating on her tongue.  Mucous membranes are moist.  Lips are peeling. Heart: RRR. Chest: Clear in front.  Breathing seems a bit more labored today. Abdomen: Nondistended.  Bowel sounds hypoactive but normal quality.  Tender diffusely.  No guarding or rebound. Extremities: No CCE. Neuro/Psych: Flat affect.  A little bit more engaged today but still not fully participating.  Has very little to say.  Not moving limbs as requested.  Intake/Output from previous day: 09/03 0701 - 09/04 0700 In: 1226.7 [P.O.:420; I.V.:806.7] Out: -   Intake/Output this shift: No intake/output data recorded.  Lab Results: Recent Labs    12/28/18 0235 12/30/18 0338  WBC 1.5* 2.2*  HGB 11.7* 10.4*  HCT 36.1 32.6*  PLT 85* 90*   BMET Recent Labs    12/28/18 0235 12/30/18 0338  NA 136 141  K 3.9 5.0  CL 114* 124*  CO2 16* 15*  GLUCOSE 83 103*  BUN 12 8  CREATININE 0.80 0.75  CALCIUM 7.7* 7.9*   LFT Recent Labs    12/28/18 0235 12/30/18 0338  PROT 5.2* 5.0*  ALBUMIN 2.1* 2.0*  AST 433* 297*  ALT 159* 114*  ALKPHOS 112 126  BILITOT 0.6 0.6   PT/INR No results for input(s): LABPROT, INR in the last 72 hours.  Hepatitis Panel No results for input(s): HEPBSAG, HCVAB, HEPAIGM, HEPBIGM in the last 72 hours.  Studies/Results: Dg Abd 1 View  Result Date: 12/29/2018 CLINICAL DATA:  Generalized abdominal pain EXAM: ABDOMEN - 1 VIEW COMPARISON:  12/19/2018 FINDINGS: Small bilateral pleural effusions. Mild diffuse gaseous dilatation of bowel without definitive obstruction. Coarse calcifications in the pelvis as before. IMPRESSION: 1. Small bilateral pleural effusions 2. Mild diffuse gaseous prominence of bowel without obstructive pattern. Findings could be secondary to mild ileus or enteritis. Electronically Signed   By: Donavan Foil M.D.   On: 12/29/2018 20:31    ASSESMENT:   *    Elevated transaminases, AST >> ALT.  transaminases improved this AM!!! No source revealed at [place], CTAP.   Serologic, lab autoimmune, viral) assays unrevealing.   ? Skeletal source of AST elevation?  Note that if this is musculoskeletal, the transaminitis began before she became bedbound but coincides with onset of weakness.  *     Abdominal pain. KUB yesterday showing non-obstructive, mild, diffuse gaseous prominence of bowel, mild ileus versus enteritis.  CTAP w CM 8/25: diffuse fluid in colon s/o possible enteritis vs diarrheal illness.  No excessive BM's, which are chronically soft, unformed for years per dtr.    *     Pancytopenia.  Heme evaluated and  signed off.   Previous low B12 level normalized with supplementation.  This AM WBCs and platelets improved.  Hgb stable over several days.     *    Malnutrition, pt refusing meals and nutritional supplements.  Although mentioned in some notes, do not get sense pt has odynophagia.       *     right leg pain.  No hip fracture noted on intake xray.    *    Bilateral pleural effusions per CT 8/25.  Small bilateral effusions on KUB 9/3.  *     Anorexia, FTT, Malnutrition, behavioral changes.  Continues to decline, refusing meds and nursing care.  Depression suspected, dtr did not  want [name] started and pt likely to refuse this anyway    PLAN   *     Pursue endoscopic evaluation only if it would change our management as the patient is not interesting in pursuing any intervention at this time.   *   ?  Place core track feeding tube, dtr consents to this.  Will defer ordering to Dr [place]   rec for TF in RD note of 9/1    Sydney [name]  12/30/2018, 9:57 AM Phone [phone]

## 2018-12-30 NOTE — Progress Notes (Signed)
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  CT of the abdomen and pelvis was negative for acute intra-abdominal or pelvic abnormality.  She was found to have mildly elevated LFTs along with hyponatremia and pancytopenia.  She was given IV fluids.  ID and GI were consulted.  Palliative care was subsequently consulted for goals of care discussion.  Assessment & Plan:   Principal Problem:   Evaluation by psychiatric service required Active Problems:   Pancytopenia ([place])   Hyponatremia   Protein calorie malnutrition (HCC)   History of TIA (transient ischemic attack)   Generalized weakness   Pressure injury of skin   Transaminitis   Malnutrition of moderate degree   Generalized weakness Moderate protein calorie malnutrition Adult failure to thrive Hypoalbuminemia Patient now representing to the hospital with weakness, and failure to thrive with decreased oral intake.  CT head negative for acute cranial abnormality.  MR brain no evidence of acute stroke.  Analysis unrevealing.  Unclear etiology, but there was thought to be a component of depression and psychiatry was consulted with recommendations of initiation of antidepressant therapy, but patient nor daughter interested at this time. --PT/OT recommends SNF, patient/daughter declined --Continue to encourage increased oral intake --Continue IV fluids --Continue Megace for appetite stimulation, patient currently refusing --Discussed once again patient may require NG tube for tube feeds given her poor oral intake --Overall prognosis has been discussed by multiple  providers which is guarded to poor.  Palliative care consultation evaluation appreciated.  Because of her continued very poor intake, unclear if patient will get better with current treatment modalities.  This is been discussed with patient's daughter multiple times once again.   --pending palliative care reconsult of for assistance with further goals of care and medical decision making given patient's lack of adherence to treatment plan/recommendations and daughter's requests for continued aggressive measures  Odynophagia with possible oropharyngeal candidiasis --Unable to use Diflucan because of increased LFTs.  Nystatin orally has been ordered but patient refuses all her meds.  GI following, appreciate input.  Elevated LFTs Patient presenting with notable elevated transaminitis.  Abdominal exam is benign.  Hepatitis panel negative. EBV and CMV IgM are negative.  Right upper quadrant ultrasound only showed gallbladder sludging.  CT abdomen/pelvis with no evidence of acute intra-abdominal or pelvic abnormality but with diffuse fluid within the colon suggestive of possible enteritis versus diarrheal illness. --Gastroenterology following, appreciate assistance --LFTs trending down --GI recommends MRCP for better imaging of the hepatic parenchyma, but patient has refused now on 2 occasions --GI believes this is proportional AST vs ALT could be from a skeletal muscle release given that she is been relatively bedbound during this hospitalization, now trending down  Pancytopenia Evaluated by hematology. Patient has been started on vitamin B12 parenterally as she had low levels of vitamin B12 during last admission but vitamin B12 level normal this admission.  No improvement with empiric vitamin B12 supplementation. --Outpatient follow-up with hematology.  No plans for bone marrow biopsy during this admission.  Hyponatremia --Sodium 128 in the ED.  141  today.  Continue supportive measures with IV  fluid hydration given lack of oral intake.  Hypokalemia Hypomagnesemia --Improved.  K 5.0 today  History of TIA  Patient has not received Plavix since 12/24/2018 as she has refused.  Stopped Plavix for now for possible need for EGD/liver biopsy or any sort of invasive procedure. --continue aspirin; although patient refusing all medications at this time  Stage II sacral pressure injury: Present on admission --continue wound care as per wound care consult recommendations.  Generalized deconditioning PT recommends SNF placement.  Daughter thinks that patient will get better and will be able to go home.   DVT prophylaxis: SCDs Code Status: Full code Family Communication: Discussed plan of care with patient's daughter extensively at bedside this morning and once again this afternoon. Disposition Plan: Continue inpatient, pending MRCP; poor/grim prognosis as patient has not been eating or taking any of her medicines.   Consultants:   Gastroenterology, [place]  Infectious disease  Hematology  Palliative care  Procedures: None   Antimicrobials: None   Subjective:  Patient seen and examined at bedside, resting comfortably.  Refusing physical exam today.  No family present at bedside this morning.  Patient did agree to lab draw this morning.  Otherwise refuses all other interventions/medications.  Continues with very poor oral intake.  Discussed with patient's nurse today who is seen her over the past week, states that she has had no appreciable change in her symptoms or current presentation since initial hospitalization. No acute events overnight per nursing staff.  Objective: Vitals:   12/29/18 0519 12/29/18 1727 12/29/18 2100 12/30/18 0501  BP: 97/76 125/67 118/64 112/70  Pulse: 91 86 84 90  Resp: 16   19  Temp: 98 F (36.7 C) 97.9 F (36.6 C) 97.9 F (36.6 C) 98 F (36.7 C)  TempSrc: Axillary Oral Axillary Axillary  SpO2: 96% 99% 97% 97%  Weight:      Height:         Intake/Output Summary (Last 24 hours) at 12/30/2018 1426 Last data filed at 12/30/2018 1108 Gross per 24 hour  Intake 1009.7 ml  Output -  Net 1009.7 ml   Filed Weights   12/24/18 0500 12/25/18 0500 12/26/18 0500  Weight: 50.3 kg 50 kg 61 kg    Examination:  General exam: Appears calm and comfortable, thin and cachectic in appearance Respiratory system: Clear to auscultation. Respiratory effort normal. Cardiovascular system: S1 & S2 heard, RRR. No JVD, murmurs, rubs, gallops or clicks. No pedal edema. Gastrointestinal system: Abdomen is nondistended, soft and nontender. No organomegaly or masses felt. Normal bowel sounds heard. Central nervous system: Sleeping but arousable and alert Extremities: Symmetric 5 x 5 power. Skin: No rashes, lesions or ulcers Psychiatry: Depressed mood, flat affect    Data Reviewed: I have personally reviewed following labs and imaging studies  CBC: Recent Labs  Lab 12/24/18 0154 12/25/18 0302 12/26/18 0323 12/27/18 0133 12/28/18 0235 12/30/18 0338  WBC 1.6* 1.7* 1.5* 1.3* 1.5* 2.2*  NEUTROABS 1.0* 1.1* 0.8* 0.7* 0.9*  --   HGB 10.1* 10.1* 10.5* 10.4* 11.7* 10.4*  HCT 29.1* 28.8* 30.3* 30.9* 36.1 32.6*  MCV 89.5 89.2 91.0 93.6 96.0 96.4  PLT 94* 90* 80* 88* 85* 90*   Basic Metabolic Panel: Recent Labs  Lab 12/24/18 0154 12/25/18 0302 12/26/18 0323 12/27/18 0133 12/28/18 0235 12/30/18 0338  NA 135 134* 134* 133* 136 141  K 3.0* 2.7* 2.7* 3.8 3.9 5.0  CL 111 104 104 107 114* 124*  CO2 19* 24 24 20* 16* 15*  GLUCOSE 107* 107* 77 106* 83 103*  BUN _0 12  8  CREATININE 0.69 0.57 0.74 0.81 0.80 0.75  CALCIUM 7.8* 7.5* 7.5* 7.5* 7.7* 7.9*  MG 1.7 1.6* 2.3 2.1 2.2  --    GFR: Estimated Creatinine Clearance: 41.8 mL/min (by C-G formula based on SCr of 0.75 mg/dL). Liver Function Tests: Recent Labs  Lab 12/25/18 0302 12/26/18 0323 12/27/18 0133 12/28/18 0235 12/30/18 0338  AST 356* 386* 398* 433* 297*  ALT 153* 153*  152* 159* 114*  ALKPHOS 83 98 105 112 126  BILITOT 0.7 0.5 0.6 0.6 0.6  PROT 4.8* 4.7* 5.1* 5.2* 5.0*  ALBUMIN 1.9* 1.8* 2.0* 2.1* 2.0*   No results for input(s): LIPASE, AMYLASE in the last 168 hours. No results for input(s): AMMONIA in the last 168 hours. Coagulation Profile: No results for input(s): INR, PROTIME in the last 168 hours. Cardiac Enzymes: Recent Labs  Lab 12/30/18 0338  CKTOTAL 254*   BNP (last 3 results) No results for input(s): PROBNP in the last 8760 hours. HbA1C: No results for input(s): HGBA1C in the last 72 hours. CBG: Recent Labs  Lab 12/25/18 0747 12/26/18 0828 12/26/18 0902 12/27/18 0827 12/29/18 0847  GLUCAP 104* 63* 111* 153* 111*   Lipid Profile: No results for input(s): CHOL, HDL, LDLCALC, TRIG, CHOLHDL, LDLDIRECT in the last 72 hours. Thyroid Function Tests: No results for input(s): TSH, T4TOTAL, FREET4, T3FREE, THYROIDAB in the last 72 hours. Anemia Panel: No results for input(s): VITAMINB12, FOLATE, FERRITIN, TIBC, IRON, RETICCTPCT in the last 72 hours. Sepsis Labs: No results for input(s): PROCALCITON, LATICACIDVEN in the last 168 hours.  Recent Results (from the past 240 hour(s))  Culture, group A strep     Status: None   Collection Time: 12/25/18 11:27 AM   Specimen: Throat  Result Value Ref Range Status   Specimen Description THROAT  Final   Special Requests NONE  Final   Culture   Final    NO GROUP A STREP (S.PYOGENES) ISOLATED Performed at [facility], [address], [place], [place] [address]    Report Status 12/28/2018 FINAL  Final         Radiology Studies: Dg Abd 1 View  Result Date: 12/29/2018 CLINICAL DATA:  Generalized abdominal pain EXAM: ABDOMEN - 1 VIEW COMPARISON:  12/19/2018 FINDINGS: Small bilateral pleural effusions. Mild diffuse gaseous dilatation of bowel without definitive obstruction. Coarse calcifications in the pelvis as before. IMPRESSION: 1. Small bilateral pleural effusions 2. Mild diffuse  gaseous prominence of bowel without obstructive pattern. Findings could be secondary to mild ileus or enteritis. Electronically Signed   By: Donavan Foil M.D.   On: 12/29/2018 20:31        Scheduled Meds: . aspirin EC  81 mg Oral Daily  . feeding supplement  1 Container Oral TID BM  . feeding supplement (PRO-STAT SUGAR FREE 64)  30 mL Oral BID WC  . folic acid  1 mg Oral Daily  . Gerhardt's butt cream   Topical TID  . megestrol  400 mg Oral Daily  . multivitamin with minerals  1 tablet Oral Daily  . nystatin  5 mL Oral QID  . pantoprazole  20 mg Oral Daily  . sodium chloride flush  3 mL Intravenous Q12H  . sodium chloride flush  3 mL Intravenous Q12H   Continuous Infusions: . sodium chloride    . dextrose 5 % and 0.9 % NaCl with KCl 40 mEq/L 75 mL/hr at 12/29/18 1800     LOS: 10 days    Time spent: 34 minutes  spent on chart review, discussion with nursing staff, consultants, personally reviewing all imaging studies and labs, updating family and interview/physical exam; more than 50% of that time was spent in counseling and/or coordination of care.     J [place] DO [facility] Pager [phone]  If 7PM-7AM, please contact night-coverage [email or web address] Password [email or web address] 12/30/2018, 2:26 PM

## 2018-12-30 NOTE — Plan of Care (Signed)
  Problem: Safety: Goal: Ability to remain free from injury will improve Outcome: Progressing   Problem: Skin Integrity: Goal: Risk for impaired skin integrity will decrease Outcome: Progressing   

## 2018-12-31 DIAGNOSIS — Z008 Encounter for other general examination: Secondary | ICD-10-CM | POA: Diagnosis not present

## 2018-12-31 LAB — COMPREHENSIVE METABOLIC PANEL
ALT: 111 U/L — ABNORMAL HIGH (ref 0–44)
AST: 285 U/L — ABNORMAL HIGH (ref 15–41)
Albumin: 2.2 g/dL — ABNORMAL LOW (ref 3.5–5.0)
Alkaline Phosphatase: 162 U/L — ABNORMAL HIGH (ref 38–126)
Anion gap: 5 (ref 5–15)
BUN: 7 mg/dL — ABNORMAL LOW (ref 8–23)
CO2: 14 mmol/L — ABNORMAL LOW (ref 22–32)
Calcium: 8.5 mg/dL — ABNORMAL LOW (ref 8.9–10.3)
Chloride: 122 mmol/L — ABNORMAL HIGH (ref 98–111)
Creatinine, Ser: 0.78 mg/dL (ref 0.44–1.00)
GFR calc Af Amer: >60 mL/min (ref >60)
GFR calc non Af Amer: >60 mL/min (ref >60)
Glucose, Bld: 108 mg/dL — ABNORMAL HIGH (ref 70–99)
Potassium: 5 mmol/L (ref 3.5–5.1)
Sodium: 141 mmol/L (ref 135–145)
Total Bilirubin: 0.9 mg/dL (ref 0.3–1.2)
Total Protein: 5.6 g/dL — ABNORMAL LOW (ref 6.5–8.1)

## 2018-12-31 LAB — CBC
HCT: 38.6 % (ref 36.0–46.0)
Hemoglobin: 12.2 g/dL (ref 12.0–15.0)
MCH: 31.2 pg (ref 26.0–34.0)
MCHC: 31.6 g/dL (ref 30.0–36.0)
MCV: 98.7 fL (ref 80.0–100.0)
Platelets: 82 10*3/uL — ABNORMAL LOW (ref 150–400)
RBC: 3.91 MIL/uL (ref 3.87–5.11)
RDW: 15.9 % — ABNORMAL HIGH (ref 11.5–15.5)
WBC: 2.6 10*3/uL — ABNORMAL LOW (ref 4.0–10.5)
nRBC: 0 % (ref 0.0–0.2)

## 2018-12-31 LAB — GLUCOSE, CAPILLARY: Glucose-Capillary: 89 mg/dL (ref 70–99)

## 2018-12-31 MED ORDER — LORAZEPAM 2 MG/ML IJ SOLN
0.5000 mg | INTRAMUSCULAR | Status: DC | PRN
  Administered 2018-12-31: 14:00:00 0.5 mg via INTRAVENOUS
  Filled 2018-12-31: qty 1

## 2018-12-31 NOTE — Progress Notes (Signed)
[name]) informed this nurse that after discussing options of how to provide nutrition to the patient with the MD she would prefer to wait and have the [facility] placed and understands it will most likely be 9/8 before this is done.  MD made aware.

## 2018-12-31 NOTE — Progress Notes (Signed)
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  CT of the abdomen and pelvis was negative for acute intra-abdominal or pelvic abnormality.  She was found to have mildly elevated LFTs along with hyponatremia and pancytopenia.  She was given IV fluids.  ID and GI were consulted.  Palliative care was subsequently consulted for goals of care discussion.  Assessment & Plan:   Principal Problem:   Evaluation by psychiatric service required Active Problems:   Pancytopenia ([place])   Hyponatremia   Protein calorie malnutrition (HCC)   History of TIA (transient ischemic attack)   Generalized weakness   Pressure injury of skin   Transaminitis   Malnutrition of moderate degree   Generalized weakness Moderate protein calorie malnutrition Adult failure to thrive Hypoalbuminemia Patient now representing to the hospital with weakness, and failure to thrive with decreased oral intake.  CT head negative for acute cranial abnormality.  MR brain no evidence of acute stroke.  Analysis unrevealing.  Unclear etiology, but there was thought to be a component of depression and psychiatry was consulted with recommendations of initiation of antidepressant therapy, but patient nor daughter interested at this time. --PT/OT recommends SNF, patient/daughter declined --Continue to encourage increased oral intake; patient with very little oral intake over the past 10 days --Continue IV fluids --Continue Megace for appetite stimulation, patient currently refusing --Attempts NG tube placement today; now that daughter agrees, ordered NG vs cortrack tube  insertion --Overall prognosis has been discussed by multiple providers which is guarded to poor.  Palliative care consultation evaluation appreciated.  Despite aggressive measures, patient's condition continues to further decline as she is refusing most interventions and treatment at this time.  Despite multiple conversations with her daughter, she continues to request aggressive measures; which seems to be counter from what her mother's wishes are given her current behavior.  Odynophagia with possible oropharyngeal candidiasis --Unable to use Diflucan because of increased LFTs.  Nystatin orally has been ordered but patient refuses all her meds.  GI following, appreciate input.  Elevated LFTs Patient presenting with notable elevated transaminitis.  Abdominal exam is benign.  Hepatitis panel negative. EBV and CMV IgM are negative.  Right upper quadrant ultrasound only showed gallbladder sludging.  CT abdomen/pelvis with no evidence of acute intra-abdominal or pelvic abnormality but with diffuse fluid within the colon suggestive of possible enteritis versus diarrheal illness.  KUB on 12/29/2018 with mild diffuse gaseous prominence without obstructive pattern. --Gastroenterology following, appreciate assistance --LFTs continue trending down --GI recommends MRCP for better imaging of the hepatic parenchyma, but patient has refused now on 2 occasions --GI believes transaminitis could reflect from skeletal muscle release given that she is been relatively bedbound during this hospitalization; but now trending down  Pancytopenia Evaluated by hematology. Patient has been started on vitamin B12 parenterally as she had low levels of vitamin B12 during last admission but vitamin B12 level normal this admission.  No improvement with empiric vitamin B12 supplementation. --Outpatient follow-up with hematology.  No plans for bone marrow biopsy during this admission.  Hyponatremia --Sodium 128 in the ED.  141   today.  Continue supportive measures with IV fluid hydration given lack of oral intake.  Hypokalemia Hypomagnesemia --Improved. K 5.0 today  History of TIA  Patient has not received Plavix since 12/24/2018 as she has refused.  Stopped Plavix for now for possible need for EGD/liver biopsy or any sort of invasive procedure. --continue aspirin; although patient refusing all medications at this time  Stage II sacral pressure injury: Present on admission --continue wound care as per wound care consult recommendations.  Generalized deconditioning PT recommends SNF placement.  Daughter thinks that patient will get better and will be able to go home and declines SNF placement.  Ethics: Patient now representing after recent hospitalization, was home for roughly 1-2 days and returned with worsening weakness, severe dehydration, and adult failure to thrive.  Daughter believes that she just will require some nutrition through artificial means to get her back to her previous baseline that she states that her mother was independent with her ADLs and cooking for herself.  Daughter states that her current decline only started on 12/28/2018; which is counter to all nursing reports and previous provider reports during her care during this hospitalization.  Daughter seems to be struggling with the notion that her mother is in the active dying process in regards to her adult failure to thrive in which she is refusing most if not all interventions and has not been eating or drinking now for approximately 7 days.  At this point, daughter agrees to proceed with NG tube placement to increase her nutrition.  Given this patient's lack response and repeat hospitalization in less than a few days from previous discharge, patient's prognosis is extremely grim, and this has been relayed to the patient's daughter on multiple occasions by multiple providers.   DVT prophylaxis: SCDs Code Status: Full code Family Communication:  Discussed plan of care with patient's daughter extensively at bedside this morning and once again this afternoon. Disposition Plan: Continue inpatient, pending MRCP; poor/grim prognosis as patient has not been eating or taking any of her medicines.   Consultants:   Gastroenterology, [place]  Infectious disease  Hematology  Palliative care  Procedures: None   Antimicrobials: None   Subjective:  Patient seen and examined at bedside, resting comfortably.  Continues to refuse physical exam.  No family present at bedside this morning, updated [name] by telephone this morning.  [name] was upset that the feeding tube was in place last night.  Although, patient not compliant with nasogastric tube insertion overnight.  Otherwise patient continues to refuses all  interventions/medications.  Continues with very poor oral intake.  Discussed with patient's nurse today who is seen over 1 week ago, and she states that she appears to be in the same condition as she was over 1 week ago with little interaction, poor oral intake. No acute events overnight per nursing staff.  Objective: Vitals:   12/30/18 1455 12/30/18 2130 12/31/18 0531 12/31/18 1433  BP: 113/70 134/71 (!) 147/79 118/69  Pulse: 85 88 (!) 106 85  Resp: '18 18 18 18  ' Temp: 97.9 F (36.6 C) 98.1 F (36.7 C) 98 F (36.7 C) 97.6 F (36.4 C)  TempSrc: Axillary Oral Oral Axillary  SpO2: 100% 100% 93% 100%  Weight:      Height:        Intake/Output Summary (Last 24 hours) at 12/31/2018 1446 Last data filed at 12/31/2018 1433 Gross per 24 hour  Intake 1635.18 ml  Output 450 ml  Net 1185.18 ml   Filed Weights   12/24/18 0500 12/25/18 0500 12/26/18 0500  Weight: 50.3 kg 50 kg 61  kg    Examination:  General exam: Appears calm when resting but becomes agitated when asked questions or attempts at physical exam, thin and cachectic in appearance Respiratory system: Clear to auscultation. Respiratory effort  normal. Cardiovascular system: S1 & S2 heard, RRR. No JVD, murmurs, rubs, gallops or clicks. No pedal edema. Gastrointestinal system: Abdomen is nondistended, soft and nontender. No organomegaly or masses felt. Normal bowel sounds heard. Central nervous system: Sleeping but arousable and alert Extremities: Symmetric 5 x 5 power. Skin: No rashes, lesions or ulcers Psychiatry: Depressed mood, flat affect    Data Reviewed: I have personally reviewed following labs and imaging studies  CBC: Recent Labs  Lab 12/25/18 0302 12/26/18 0323 12/27/18 0133 12/28/18 0235 12/30/18 0338 12/31/18 0532  WBC 1.7* 1.5* 1.3* 1.5* 2.2* 2.6*  NEUTROABS 1.1* 0.8* 0.7* 0.9*  --   --   HGB 10.1* 10.5* 10.4* 11.7* 10.4* 12.2  HCT 28.8* 30.3* 30.9* 36.1 32.6* 38.6  MCV 89.2 91.0 93.6 96.0 96.4 98.7  PLT 90* 80* 88* 85* 90* 82*   Basic Metabolic Panel: Recent Labs  Lab 12/25/18 0302 12/26/18 0323 12/27/18 0133 12/28/18 0235 12/30/18 0338 12/31/18 0532  NA 134* 134* 133* 136 141 141  K 2.7* 2.7* 3.8 3.9 5.0 5.0  CL 104 104 107 114* 124* 122*  CO2 24 24 20* 16* 15* 14*  GLUCOSE 107* 77 106* 83 103* 108*  BUN '8 10 9 12 8 ' 7*  CREATININE 0.57 0.74 0.81 0.80 0.75 0.78  CALCIUM 7.5* 7.5* 7.5* 7.7* 7.9* 8.5*  MG 1.6* 2.3 2.1 2.2  --   --    GFR: Estimated Creatinine Clearance: 41.8 mL/min (by C-G formula based on SCr of 0.78 mg/dL). Liver Function Tests: Recent Labs  Lab 12/26/18 0323 12/27/18 0133 12/28/18 0235 12/30/18 0338 12/31/18 0532  AST 386* 398* 433* 297* 285*  ALT 153* 152* 159* 114* 111*  ALKPHOS 98 105 112 126 162*  BILITOT 0.5 0.6 0.6 0.6 0.9  PROT 4.7* 5.1* 5.2* 5.0* 5.6*  ALBUMIN 1.8* 2.0* 2.1* 2.0* 2.2*   No results for input(s): LIPASE, AMYLASE in the last 168 hours. No results for input(s): AMMONIA in the last 168 hours. Coagulation Profile: No results for input(s): INR, PROTIME in the last 168 hours. Cardiac Enzymes: Recent Labs  Lab 12/30/18 0338  CKTOTAL 254*    BNP (last 3 results) No results for input(s): PROBNP in the last 8760 hours. HbA1C: No results for input(s): HGBA1C in the last 72 hours. CBG: Recent Labs  Lab 12/26/18 0828 12/26/18 0902 12/27/18 0827 12/29/18 0847 12/31/18 0808  GLUCAP 63* 111* 153* 111* 89   Lipid Profile: No results for input(s): CHOL, HDL, LDLCALC, TRIG, CHOLHDL, LDLDIRECT in the last 72 hours. Thyroid Function Tests: No results for input(s): TSH, T4TOTAL, FREET4, T3FREE, THYROIDAB in the last 72 hours. Anemia Panel: No results for input(s): VITAMINB12, FOLATE, FERRITIN, TIBC, IRON, RETICCTPCT in the last 72 hours. Sepsis Labs: No results for input(s): PROCALCITON, LATICACIDVEN in the last 168 hours.  Recent Results (from the past 240 hour(s))  Culture, group A strep     Status: None   Collection Time: 12/25/18 11:27 AM   Specimen: Throat  Result Value Ref Range Status   Specimen Description THROAT  Final   Special Requests NONE  Final   Culture   Final    NO GROUP A STREP (S.PYOGENES) ISOLATED Performed at [facility], [address] [place], [place] [address]    Report Status 12/28/2018  FINAL  Final         Radiology Studies: Dg Abd 1 View  Result Date: 12/29/2018 CLINICAL DATA:  Generalized abdominal pain EXAM: ABDOMEN - 1 VIEW COMPARISON:  12/19/2018 FINDINGS: Small bilateral pleural effusions. Mild diffuse gaseous dilatation of bowel without definitive obstruction. Coarse calcifications in the pelvis as before. IMPRESSION: 1. Small bilateral pleural effusions 2. Mild diffuse gaseous prominence of bowel without obstructive pattern. Findings could be secondary to mild ileus or enteritis. Electronically Signed   By: Donavan Foil M.D.   On: 12/29/2018 20:31        Scheduled Meds:  aspirin EC  81 mg Oral Daily   feeding supplement  1 Container Oral TID BM   feeding supplement (PRO-STAT SUGAR FREE 64)  30 mL Oral BID WC   folic acid  1 mg Oral Daily   Gerhardt's butt cream    Topical TID   latanoprost  1 drop Both Eyes QHS   megestrol  400 mg Oral Daily   multivitamin with minerals  1 tablet Oral Daily   nystatin  5 mL Oral QID   pantoprazole  20 mg Oral Daily   sodium chloride flush  3 mL Intravenous Q12H   Continuous Infusions:  sodium chloride     dextrose 5 % and 0.9 % NaCl with KCl 40 mEq/L 75 mL/hr at 12/31/18 0620     LOS: 11 days    Time spent: 42 minutes spent on chart review, discussion with nursing staff, consultants, personally reviewing all imaging studies and labs, updating family and interview/physical exam; more than 50% of that time was spent in counseling and/or coordination of care.    Michiko Lineman J [place] DO [facility] Pager [phone]  If 7PM-7AM, please contact night-coverage [email or web address] Password [email or web address] 12/31/2018, 2:46 PM

## 2018-12-31 NOTE — Progress Notes (Signed)
Attempted to insert size 12 french NG tube into the left nare with the assistance of Rakita Butler-Faison, RN and [employer], NT and was unsuccessful.  Reattempted in the right nare and was again unsuccessful.  The patient was combative and unable to follow instruction.  MD made aware.

## 2019-01-01 DIAGNOSIS — Z008 Encounter for other general examination: Secondary | ICD-10-CM | POA: Diagnosis not present

## 2019-01-01 DIAGNOSIS — E8809 Other disorders of plasma-protein metabolism, not elsewhere classified: Secondary | ICD-10-CM

## 2019-01-01 LAB — COMPREHENSIVE METABOLIC PANEL
ALT: 94 U/L — ABNORMAL HIGH (ref 0–44)
AST: 237 U/L — ABNORMAL HIGH (ref 15–41)
Albumin: 2 g/dL — ABNORMAL LOW (ref 3.5–5.0)
Alkaline Phosphatase: 150 U/L — ABNORMAL HIGH (ref 38–126)
Anion gap: 4 — ABNORMAL LOW (ref 5–15)
BUN: 7 mg/dL — ABNORMAL LOW (ref 8–23)
CO2: 14 mmol/L — ABNORMAL LOW (ref 22–32)
Calcium: 8.4 mg/dL — ABNORMAL LOW (ref 8.9–10.3)
Chloride: 123 mmol/L — ABNORMAL HIGH (ref 98–111)
Creatinine, Ser: 0.7 mg/dL (ref 0.44–1.00)
GFR calc Af Amer: >60 mL/min (ref >60)
GFR calc non Af Amer: >60 mL/min (ref >60)
Glucose, Bld: 89 mg/dL (ref 70–99)
Potassium: 4.7 mmol/L (ref 3.5–5.1)
Sodium: 141 mmol/L (ref 135–145)
Total Bilirubin: 0.6 mg/dL (ref 0.3–1.2)
Total Protein: 5.3 g/dL — ABNORMAL LOW (ref 6.5–8.1)

## 2019-01-01 LAB — CBC
HCT: 33.2 % — ABNORMAL LOW (ref 36.0–46.0)
Hemoglobin: 11.1 g/dL — ABNORMAL LOW (ref 12.0–15.0)
MCH: 31.4 pg (ref 26.0–34.0)
MCHC: 33.4 g/dL (ref 30.0–36.0)
MCV: 93.8 fL (ref 80.0–100.0)
Platelets: 88 10*3/uL — ABNORMAL LOW (ref 150–400)
RBC: 3.54 MIL/uL — ABNORMAL LOW (ref 3.87–5.11)
RDW: 15.4 % (ref 11.5–15.5)
WBC: 2.8 10*3/uL — ABNORMAL LOW (ref 4.0–10.5)
nRBC: 1.1 % — ABNORMAL HIGH (ref 0.0–0.2)

## 2019-01-01 LAB — GLUCOSE, CAPILLARY
Glucose-Capillary: 68 mg/dL — ABNORMAL LOW (ref 70–99)
Glucose-Capillary: 118 mg/dL — ABNORMAL HIGH (ref 70–99)

## 2019-01-01 MED ORDER — DEXTROSE 50 % IV SOLN
INTRAVENOUS | Status: AC
  Administered 2019-01-01: 25 mL
  Filled 2019-01-01: qty 50

## 2019-01-01 MED ORDER — DEXTROSE-NACL 5-0.45 % IV SOLN
INTRAVENOUS | Status: DC
  Administered 2019-01-01: 20:00:00 via INTRAVENOUS
  Administered 2019-01-01: 1000 mL via INTRAVENOUS
  Administered 2019-01-02 – 2019-01-05 (×5): via INTRAVENOUS

## 2019-01-01 NOTE — Progress Notes (Signed)
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  CT of the abdomen and pelvis was negative for acute intra-abdominal or pelvic abnormality.  She was found to have mildly elevated LFTs along with hyponatremia and pancytopenia.  She was given IV fluids.  ID and GI were consulted.  Palliative care was subsequently consulted for goals of care discussion.  Assessment & Plan:   Principal Problem:   Evaluation by psychiatric service required Active Problems:   Pancytopenia ([place])   Hyponatremia   Protein calorie malnutrition (HCC)   History of TIA (transient ischemic attack)   Generalized weakness   Pressure injury of skin   Transaminitis   Malnutrition of moderate degree   Generalized weakness Moderate protein calorie malnutrition Adult failure to thrive Hypoalbuminemia Patient now representing to the hospital with weakness, and failure to thrive with decreased oral intake.  CT head negative for acute cranial abnormality.  MR brain no evidence of acute stroke.  Analysis unrevealing.  Unclear etiology, but there was thought to be a component of depression and psychiatry was consulted with recommendations of initiation of antidepressant therapy, but patient nor daughter interested at this time. --PT/OT recommends SNF, patient/daughter declined --Continue to encourage increased oral intake; patient with very little oral intake during hospitalization --Continue IV fluids --Continue Megace for appetite stimulation, patient currently refusing --Attempted NG tube placement 9/5 unsuccessful with multiple attempts --Pending  coretrak insertion, likely Tuesday --Overall prognosis has been discussed by multiple providers which is guarded to poor.  Palliative care consultation evaluation appreciated.  Despite aggressive measures, patient's condition continues to further decline as she is refusing most interventions and treatment at this time.  Despite multiple conversations with her daughter, she continues to request aggressive measures as she states her mother's only wishes are "to live"; which seems to be counter from what her mother's wishes are given her current behavior as she is currently refusing all interventions and treatment.  Have high suspicion that her mother suffers from severe depression with recent loss of her sister 2 months ago, although her daughter thinks that this Link Snuffer is completely wrong.  Odynophagia with possible oropharyngeal candidiasis Unable to use Diflucan because of increased LFTs.  Nystatin orally has been ordered but patient refuses all her meds.  GI following, appreciate input.  Elevated LFTs Patient presenting with notable elevated transaminitis.  Abdominal exam is benign.  Hepatitis panel negative. EBV and CMV IgM are negative.  Right upper quadrant ultrasound only showed gallbladder sludging.  CT abdomen/pelvis with no evidence of acute intra-abdominal or pelvic abnormality but with diffuse fluid within the colon suggestive of possible enteritis versus diarrheal illness.  KUB on 12/29/2018 with mild diffuse gaseous prominence without obstructive pattern.  Suspicion for possible mild shock liver given she was hypotensive on presentation.  GI also believes that this transaminitis could reflect from skeletal muscle release as well. --Gastroenterology following, appreciate assistance --LFTs continue trending down --GI recommends MRCP for better imaging of the hepatic parenchyma, but patient has refused now on 2 occasions; likely unnecessary as LFTs trending down.  Pancytopenia Evaluated by  hematology. Patient has been started on vitamin B12 parenterally as she had low levels of vitamin B12  during last admission but vitamin B12 level normal this admission.  No improvement with empiric vitamin B12 supplementation. --Outpatient follow-up with hematology.  No plans for bone marrow biopsy during this admission.  Hyponatremia Sodium 128 in the ED, likely from severe dehydration from poor oral intake and adult failure to thrive.  --Sodium 141 today.  --Continue supportive measures with IV fluid hydration given lack of oral intake. --Pending coretrak tube placement, likely on Tuesday then will initiate tube feeds per daughter's wishes  Hypokalemia Hypomagnesemia --Improved. K 4.7 today  History of TIA  Patient has not received Plavix since 12/24/2018 as she has refused.  Stopped Plavix for now for possible need for EGD/liver biopsy or any sort of invasive procedure. --continue aspirin; although patient refusing all medications at this time  Stage II sacral pressure injury: Present on admission --continue wound care as per wound care consult recommendations.  Generalized deconditioning PT recommends SNF placement.  Daughter thinks that patient will get better and will be able to go home and declines SNF placement.  Ethics: Patient now representing after recent hospitalization, was home for roughly 1-2 days and returned with worsening weakness, severe dehydration, and adult failure to thrive.  Daughter believes that she just will require some nutrition through artificial means to get her back to her previous baseline that she states that her mother was independent with her ADLs and cooking for herself.  Daughter states that her current decline only started on 12/28/2018; which is counter to all nursing reports and previous provider reports during her care during this hospitalization.  Daughter seems to be struggling with the notion that her mother is in the active dying process in  regards to her adult failure to thrive in which she is refusing most if not all interventions and has not been eating or drinking now for approximately 7 days.  At this point, daughter agrees to proceed with NG tube placement to increase her nutrition.  Given this patient's lack response and repeat hospitalization in less than a few days from previous discharge, patient's prognosis is extremely grim, and this has been relayed to the patient's daughter on multiple occasions by multiple providers; with extensive discussion greater than 35 minutes once again today at bedside.   DVT prophylaxis: SCDs Code Status: Full code Family Communication: Discussed plan of care with patient's daughter extensively at bedside this morning. Disposition Plan: Continue inpatient, pending core tract tube placement; poor/grim prognosis as patient has not been eating or taking any of her medicines.   Consultants:   Gastroenterology, [place]  Infectious disease  Hematology  Palliative care  Procedures: None   Antimicrobials: None   Subjective:  Patient seen and examined at bedside, resting comfortably.  Continues to be confused. Continues with very poor oral intake.  Daughter present at bedside, and updated extensively and all questions answered.  Awaiting coretrak tube placement, likely on Tuesday.  No change in patient's disposition either worsening/improving over the previous 5 days.  No acute events overnight per nursing staff.  Objective: Vitals:   12/31/18 0531 12/31/18 1433 12/31/18 2045 01/01/19 0510  BP: (!) 147/79 118/69 128/71 136/70  Pulse: (!) 106 85 90 91  Resp: '18 18 20 16  ' Temp: 98 F (36.7 C) 97.6 F (36.4 C) 98.3 F (36.8 C) 98.3 F (36.8 C)  TempSrc: Oral Axillary Axillary Axillary  SpO2: 93% 100% 100% 99%  Weight:      Height:        Intake/Output Summary (Last 24 hours) at 01/01/2019  [place] filed at 01/01/2019 0946 Gross per 24 hour  Intake 2341.16 ml  Output -   Net 2341.16 ml   Filed Weights   12/24/18 0500 12/25/18 0500 12/26/18 0500  Weight: 50.3 kg 50 kg 61 kg    Examination:  General exam: Appears calm when resting but becomes agitated when asked questions or attempts at physical exam, thin and cachectic in appearance Respiratory system: Clear to auscultation. Respiratory effort normal. Cardiovascular system: S1 & S2 heard, RRR. No JVD, murmurs, rubs, gallops or clicks. No pedal edema. Gastrointestinal system: Abdomen is nondistended, soft and nontender. No organomegaly or masses felt. Normal bowel sounds heard. Central nervous system: Sleeping but arousable and alert Extremities: Symmetric 5 x 5 power. Skin: No rashes, lesions or ulcers Psychiatry: Depressed mood, flat affect, confused    Data Reviewed: I have personally reviewed following labs and imaging studies  CBC: Recent Labs  Lab 12/26/18 0323 12/27/18 0133 12/28/18 0235 12/30/18 0338 12/31/18 0532 01/01/19 0359  WBC 1.5* 1.3* 1.5* 2.2* 2.6* 2.8*  NEUTROABS 0.8* 0.7* 0.9*  --   --   --   HGB 10.5* 10.4* 11.7* 10.4* 12.2 11.1*  HCT 30.3* 30.9* 36.1 32.6* 38.6 33.2*  MCV 91.0 93.6 96.0 96.4 98.7 93.8  PLT 80* 88* 85* 90* 82* 88*   Basic Metabolic Panel: Recent Labs  Lab 12/26/18 0323 12/27/18 0133 12/28/18 0235 12/30/18 0338 12/31/18 0532 01/01/19 0359  NA 134* 133* 136 141 141 141  K 2.7* 3.8 3.9 5.0 5.0 4.7  CL 104 107 114* 124* 122* 123*  CO2 24 20* 16* 15* 14* 14*  GLUCOSE 77 106* 83 103* 108* 89  BUN '10 9 12 8 ' 7* 7*  CREATININE 0.74 0.81 0.80 0.75 0.78 0.70  CALCIUM 7.5* 7.5* 7.7* 7.9* 8.5* 8.4*  MG 2.3 2.1 2.2  --   --   --    GFR: Estimated Creatinine Clearance: 41.8 mL/min (by C-G formula based on SCr of 0.7 mg/dL). Liver Function Tests: Recent Labs  Lab 12/27/18 0133 12/28/18 0235 12/30/18 0338 12/31/18 0532 01/01/19 0359  AST 398* 433* 297* 285* 237*  ALT 152* 159* 114* 111* 94*  ALKPHOS 105 112 126 162* 150*  BILITOT 0.6 0.6 0.6 0.9  0.6  PROT 5.1* 5.2* 5.0* 5.6* 5.3*  ALBUMIN 2.0* 2.1* 2.0* 2.2* 2.0*   No results for input(s): LIPASE, AMYLASE in the last 168 hours. No results for input(s): AMMONIA in the last 168 hours. Coagulation Profile: No results for input(s): INR, PROTIME in the last 168 hours. Cardiac Enzymes: Recent Labs  Lab 12/30/18 0338  CKTOTAL 254*   BNP (last 3 results) No results for input(s): PROBNP in the last 8760 hours. HbA1C: No results for input(s): HGBA1C in the last 72 hours. CBG: Recent Labs  Lab 12/27/18 0827 12/29/18 0847 12/31/18 0808 01/01/19 0821 01/01/19 0858  GLUCAP 153* 111* 89 68* 118*   Lipid Profile: No results for input(s): CHOL, HDL, LDLCALC, TRIG, CHOLHDL, LDLDIRECT in the last 72 hours. Thyroid Function Tests: No results for input(s): TSH, T4TOTAL, FREET4, T3FREE, THYROIDAB in the last 72 hours. Anemia Panel: No results for input(s): VITAMINB12, FOLATE, FERRITIN, TIBC, IRON, RETICCTPCT in the last 72 hours. Sepsis Labs: No results for input(s): PROCALCITON, LATICACIDVEN in the last 168 hours.  Recent Results (from the past 240 hour(s))  Culture, group A strep     Status: None   Collection Time: 12/25/18 11:27 AM   Specimen: Throat  Result Value Ref Range Status  Specimen Description THROAT  Final   Special Requests NONE  Final   Culture   Final    NO GROUP A STREP (S.PYOGENES) ISOLATED Performed at [facility], [address], [place], [place] [address]    Report Status 12/28/2018 FINAL  Final         Radiology Studies: No results found.      Scheduled Meds: . aspirin EC  81 mg Oral Daily  . feeding supplement  1 Container Oral TID BM  . feeding supplement (PRO-STAT SUGAR FREE 64)  30 mL Oral BID WC  . folic acid  1 mg Oral Daily  . Gerhardt's butt cream   Topical TID  . latanoprost  1 drop Both Eyes QHS  . megestrol  400 mg Oral Daily  . multivitamin with minerals  1 tablet Oral Daily  . nystatin  5 mL Oral QID  . pantoprazole   20 mg Oral Daily  . sodium chloride flush  3 mL Intravenous Q12H   Continuous Infusions: . sodium chloride    . dextrose 5 % and 0.45% NaCl 100 mL/hr at 01/01/19 1122     LOS: 12 days    Time spent: 45 minutes spent on chart review, discussion with nursing staff, consultants, personally reviewing all imaging studies and labs, updating daughter extensively this morning greater than 35 minutes alone, and interview/physical exam; more than 50% of that time was spent in counseling and/or coordination of care.    Sueko Dimichele J [place] DO [facility] Pager [phone]  If 7PM-7AM, please contact night-coverage [email or web address] Password [email or web address] 01/01/2019, 2:29 PM

## 2019-01-02 DIAGNOSIS — R627 Adult failure to thrive: Secondary | ICD-10-CM | POA: Diagnosis present

## 2019-01-02 LAB — GLUCOSE, CAPILLARY: Glucose-Capillary: 75 mg/dL (ref 70–99)

## 2019-01-02 NOTE — Care Management Important Message (Signed)
Important Message  Patient Details  Name: Sydney [name] MRN: [id number] Date of Birth: [birth year]   Medicare Important Message Given:  Yes     [name] 01/02/2019, 3:32 PM

## 2019-01-02 NOTE — Progress Notes (Signed)
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  CT of the abdomen and pelvis was negative for acute intra-abdominal or pelvic abnormality.  She was found to have mildly elevated LFTs along with hyponatremia and pancytopenia.  She was given IV fluids.  ID and GI were consulted.  Palliative care was subsequently consulted for goals of care discussion.  Assessment & Plan:   Principal Problem:   Evaluation by psychiatric service required Active Problems:   Pancytopenia ([place])   Hyponatremia   Protein calorie malnutrition (HCC)   History of TIA (transient ischemic attack)   Generalized weakness   Pressure injury of skin   Transaminitis   Malnutrition of moderate degree   Generalized weakness Moderate protein calorie malnutrition Adult failure to thrive Hypoalbuminemia Patient now representing to the hospital with weakness, and failure to thrive with decreased oral intake.  CT head negative for acute cranial abnormality.  MR brain no evidence of acute stroke.  Analysis unrevealing.  Unclear etiology, but there was thought to be a component of depression and psychiatry was consulted with recommendations of initiation of antidepressant therapy, but patient nor daughter interested at this time. --PT/OT recommends SNF, patient/daughter declined --Continue to encourage increased oral intake; patient with very little oral intake during hospitalization --Continue IV fluids --Continue Megace for appetite stimulation, patient currently refusing --Attempted NG tube placement 9/5 unsuccessful with multiple attempts --Pending  coretrak insertion, likely Tuesday --Overall prognosis has been discussed by multiple providers which is guarded to poor.  Palliative care consultation evaluation appreciated.  Despite aggressive measures, patient's condition continues to further decline as she is refusing most interventions and treatment at this time.  Despite multiple conversations with her daughter, she continues to request aggressive measures as she states her mother's only wishes are "to live"; which seems to be counter from what her mother's wishes are given her current behavior as she is currently refusing all interventions and treatment.  Have high suspicion that her mother suffers from severe depression with recent loss of her sister 2 months ago, although her daughter thinks that this Link Snuffer is completely wrong.  Odynophagia with possible oropharyngeal candidiasis Unable to use Diflucan because of increased LFTs.  Nystatin orally has been ordered but patient refuses all her meds.  GI following, appreciate input.  Elevated LFTs Patient presenting with notable elevated transaminitis.  Abdominal exam is benign.  Hepatitis panel negative. EBV and CMV IgM are negative.  Right upper quadrant ultrasound only showed gallbladder sludging.  CT abdomen/pelvis with no evidence of acute intra-abdominal or pelvic abnormality but with diffuse fluid within the colon suggestive of possible enteritis versus diarrheal illness.  KUB on 12/29/2018 with mild diffuse gaseous prominence without obstructive pattern.  Suspicion for possible mild shock liver given she was hypotensive on presentation.  GI also believes that this transaminitis could reflect from skeletal muscle release as well. --Gastroenterology now signed off as of 01/01/2019 --LFTs continue trending down  Pancytopenia Evaluated by hematology. Patient has been started on vitamin B12 parenterally as she had low levels of vitamin B12 during last admission but vitamin B12 level normal this  admission.  No improvement with empiric vitamin B12 supplementation. --Outpatient follow-up with  hematology.  No plans for bone marrow biopsy during this admission.  Hyponatremia Sodium 128 in the ED, likely from severe dehydration from poor oral intake and adult failure to thrive.  Sodium improved up to 141 with IV fluid hydration. --Continue supportive measures with IV fluid hydration given lack of oral intake. --Pending coretrak tube placement, likely on Tuesday then will initiate tube feeds per daughter's wishes  Hypokalemia Hypomagnesemia --Improved. K 4.7 today  History of TIA  Patient has not received Plavix since 12/24/2018 as she has refused.  Stopped Plavix for now for possible need for EGD/liver biopsy or any sort of invasive procedure. --continue aspirin; although patient refusing all medications at this time  Stage II sacral pressure injury: Present on admission --continue wound care as per wound care consult recommendations.  Generalized deconditioning PT recommends SNF placement.  Daughter thinks that patient will get better and will be able to go home and declines SNF placement.  Ethics: Patient now representing after recent hospitalization, was home for roughly 1-2 days and returned with worsening weakness, severe dehydration, and adult failure to thrive.  Daughter believes that she just will require some nutrition through artificial means to get her back to her previous baseline. She states that her mother was independent with her ADLs and cooking for herself.  Daughter states that her current decline only started on 12/28/2018; which is counter to all nursing reports and previous provider reports during her care during this hospitalization.  Daughter seems to be struggling with the notion that her mother is in the active dying process in regards to her adult failure to thrive in which she is refusing most if not all interventions and has not been eating or drinking now for  approximately 10 days.  At this point, daughter agrees to proceed with NG tube placement to increase her nutrition.  Given this patient's lack response and repeat hospitalization in less than a few days from previous discharge, patient's prognosis is extremely grim, and this has been relayed to the patient's daughter on multiple occasions by multiple providers; with extensive discussion greater than 25 minutes once again today at bedside.   DVT prophylaxis: SCDs Code Status: Full code Family Communication: Discussed plan of care with patient's daughter extensively at bedside this morning. Disposition Plan: Continue inpatient, pending core tract tube placement; poor/grim prognosis as patient has not been eating or taking any of her medicines.   Consultants:   Gastroenterology, [place]  Infectious disease  Hematology  Palliative care  Procedures: None   Antimicrobials: None   Subjective:  Patient seen and examined at bedside, resting comfortably.  Continues to be confused. Continues with very poor oral intake.  Daughter present at bedside, and updated extensively and all questions answered.  Awaiting coretrak tube placement, likely on Tuesday.  No change in patient's disposition either worsening/improving over the previous 6 days.  No acute events overnight per nursing staff.  Objective: Vitals:   12/31/18 2045 01/01/19 0510 01/01/19 2032 01/02/19 0430  BP: 128/71 136/70 124/72 132/70  Pulse: 90 91 97 97  Resp: '20 16 18 17  ' Temp:  98.3 F (36.8 C) 98.6 F (37 C) 100.1 F (37.8 C)  TempSrc: Axillary Axillary Oral Oral  SpO2: 100% 99% 100% 97%  Weight:      Height:        Intake/Output Summary (Last 24 hours) at 01/02/2019 1357 Last data filed at 01/02/2019 0939 Gross per 24 hour  Intake 0 ml  Output -  Net 0 ml  Filed Weights   12/24/18 0500 12/25/18 0500 12/26/18 0500  Weight: 50.3 kg 50 kg 61 kg    Examination:  General exam: Appears calm when resting but  becomes agitated when asked questions or attempts at physical exam, thin and cachectic in appearance Respiratory system: Clear to auscultation. Respiratory effort normal. Cardiovascular system: S1 & S2 heard, RRR. No JVD, murmurs, rubs, gallops or clicks. No pedal edema. Gastrointestinal system: Abdomen is nondistended, soft and nontender. No organomegaly or masses felt. Normal bowel sounds heard. Central nervous system: Sleeping but arousable and alert Extremities: Symmetric 5 x 5 power. Skin: No rashes, lesions or ulcers Psychiatry: Depressed mood, flat affect, confused    Data Reviewed: I have personally reviewed following labs and imaging studies  CBC: Recent Labs  Lab 12/27/18 0133 12/28/18 0235 12/30/18 0338 12/31/18 0532 01/01/19 0359  WBC 1.3* 1.5* 2.2* 2.6* 2.8*  NEUTROABS 0.7* 0.9*  --   --   --   HGB 10.4* 11.7* 10.4* 12.2 11.1*  HCT 30.9* 36.1 32.6* 38.6 33.2*  MCV 93.6 96.0 96.4 98.7 93.8  PLT 88* 85* 90* 82* 88*   Basic Metabolic Panel: Recent Labs  Lab 12/27/18 0133 12/28/18 0235 12/30/18 0338 12/31/18 0532 01/01/19 0359  NA 133* 136 141 141 141  K 3.8 3.9 5.0 5.0 4.7  CL 107 114* 124* 122* 123*  CO2 20* 16* 15* 14* 14*  GLUCOSE 106* 83 103* 108* 89  BUN '9 12 8 ' 7* 7*  CREATININE 0.81 0.80 0.75 0.78 0.70  CALCIUM 7.5* 7.7* 7.9* 8.5* 8.4*  MG 2.1 2.2  --   --   --    GFR: Estimated Creatinine Clearance: 41.8 mL/min (by C-G formula based on SCr of 0.7 mg/dL). Liver Function Tests: Recent Labs  Lab 12/27/18 0133 12/28/18 0235 12/30/18 0338 12/31/18 0532 01/01/19 0359  AST 398* 433* 297* 285* 237*  ALT 152* 159* 114* 111* 94*  ALKPHOS 105 112 126 162* 150*  BILITOT 0.6 0.6 0.6 0.9 0.6  PROT 5.1* 5.2* 5.0* 5.6* 5.3*  ALBUMIN 2.0* 2.1* 2.0* 2.2* 2.0*   No results for input(s): LIPASE, AMYLASE in the last 168 hours. No results for input(s): AMMONIA in the last 168 hours. Coagulation Profile: No results for input(s): INR, PROTIME in the last 168  hours. Cardiac Enzymes: Recent Labs  Lab 12/30/18 0338  CKTOTAL 254*   BNP (last 3 results) No results for input(s): PROBNP in the last 8760 hours. HbA1C: No results for input(s): HGBA1C in the last 72 hours. CBG: Recent Labs  Lab 12/29/18 0847 12/31/18 0808 01/01/19 0821 01/01/19 0858 01/02/19 0744  GLUCAP 111* 89 68* 118* 75   Lipid Profile: No results for input(s): CHOL, HDL, LDLCALC, TRIG, CHOLHDL, LDLDIRECT in the last 72 hours. Thyroid Function Tests: No results for input(s): TSH, T4TOTAL, FREET4, T3FREE, THYROIDAB in the last 72 hours. Anemia Panel: No results for input(s): VITAMINB12, FOLATE, FERRITIN, TIBC, IRON, RETICCTPCT in the last 72 hours. Sepsis Labs: No results for input(s): PROCALCITON, LATICACIDVEN in the last 168 hours.  Recent Results (from the past 240 hour(s))  Culture, group A strep     Status: None   Collection Time: 12/25/18 11:27 AM   Specimen: Throat  Result Value Ref Range Status   Specimen Description THROAT  Final   Special Requests NONE  Final   Culture   Final    NO GROUP A STREP (S.PYOGENES) ISOLATED Performed at [facility], [address], [place], [place] [address]  Report Status 12/28/2018 FINAL  Final         Radiology Studies: No results found.      Scheduled Meds: . aspirin EC  81 mg Oral Daily  . feeding supplement  1 Container Oral TID BM  . feeding supplement (PRO-STAT SUGAR FREE 64)  30 mL Oral BID WC  . folic acid  1 mg Oral Daily  . Gerhardt's butt cream   Topical TID  . latanoprost  1 drop Both Eyes QHS  . megestrol  400 mg Oral Daily  . multivitamin with minerals  1 tablet Oral Daily  . nystatin  5 mL Oral QID  . pantoprazole  20 mg Oral Daily  . sodium chloride flush  3 mL Intravenous Q12H   Continuous Infusions: . sodium chloride    . dextrose 5 % and 0.45% NaCl 100 mL/hr at 01/02/19 0754     LOS: 13 days    Time spent:32 minutes spent on chart review, discussion with nursing staff,  consultants, personally reviewing all imaging studies and labs, and interview/physical exam; more than 50% of that time was spent in counseling and/or coordination of care.    Eric J [place] DO [facility] Pager [phone]  If 7PM-7AM, please contact night-coverage [email or web address] Password [email or web address] 01/02/2019, 1:57 PM

## 2019-01-02 NOTE — Progress Notes (Signed)
PT Cancellation Note  Patient Details Name: Sydney [name] MRN: [id number] DOB: 07/11/1930   Cancelled Treatment:    Reason Eval/Treat Not Completed: (P) Pain limiting ability to participate;Medical issues which prohibited therapy;Other (comment)(Pt declining with no signs of improvement, spoke with MD who reports he will d/c PT orders at this time and reconsult as need arises.)  Spoke with daughter who reports today is not a good day for her mom but she wants to continue for PT to check on her mother as she reports, "we need to get her moving."  Pt's daughter unrealistic in expectations of her mother.  MD reports he will discontinue orders and reconsult if need arises.   [name] Eli Hose 01/02/2019, 5:29 PM Governor Rooks, PTA [facility] Pager [phone] Office [phone]

## 2019-01-03 ENCOUNTER — Ambulatory Visit: Payer: Medicare Other | Admitting: Cardiology

## 2019-01-03 DIAGNOSIS — R627 Adult failure to thrive: Secondary | ICD-10-CM | POA: Diagnosis not present

## 2019-01-03 LAB — GLUCOSE, CAPILLARY
Glucose-Capillary: 89 mg/dL (ref 70–99)
Glucose-Capillary: 89 mg/dL (ref 70–99)
Glucose-Capillary: 96 mg/dL (ref 70–99)
Glucose-Capillary: 102 mg/dL — ABNORMAL HIGH (ref 70–99)

## 2019-01-03 LAB — C DIFFICILE QUICK SCREEN W PCR REFLEX
C Diff antigen: NEGATIVE
C Diff interpretation: NOT DETECTED
C Diff toxin: NEGATIVE

## 2019-01-03 MED ORDER — JEVITY 1.2 CAL PO LIQD: 1000.0000 mL | ORAL | Status: DC

## 2019-01-03 MED ORDER — LORAZEPAM 2 MG/ML IJ SOLN
0.5000 mg | INTRAMUSCULAR | Status: AC
  Administered 2019-01-03: 10:00:00 0.5 mg via INTRAVENOUS
  Filled 2019-01-03: qty 1

## 2019-01-03 MED ORDER — OSMOLITE 1.2 CAL PO LIQD
1000.0000 mL | ORAL | Status: DC
  Administered 2019-01-03 – 2019-01-19 (×10): 1000 mL
  Filled 2019-01-03 (×20): qty 1000

## 2019-01-03 MED ORDER — ADULT MULTIVITAMIN W/MINERALS CH
1.0000 | ORAL_TABLET | Freq: Every day | ORAL | Status: DC
  Administered 2019-01-04: 10:00:00 1
  Filled 2019-01-03 (×3): qty 1

## 2019-01-03 MED ORDER — MEGESTROL ACETATE 400 MG/10ML PO SUSP
400.0000 mg | Freq: Every day | ORAL | Status: DC
  Administered 2019-01-04 – 2019-01-19 (×14): 400 mg
  Filled 2019-01-03 (×18): qty 10

## 2019-01-03 NOTE — Progress Notes (Addendum)
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  CT of the abdomen and pelvis was negative for acute intra-abdominal or pelvic abnormality.  She was found to have mildly elevated LFTs along with hyponatremia and pancytopenia.  She was given IV fluids.  ID and GI were consulted.  Palliative care was subsequently consulted for goals of care discussion.  Assessment & Plan:   Principal Problem:   Adult failure to thrive Active Problems:   Pancytopenia (HCC)   Hyponatremia   Protein calorie malnutrition (HCC)   History of TIA (transient ischemic attack)   Generalized weakness   Pressure injury of skin   Transaminitis   Malnutrition of moderate degree   Evaluation by psychiatric service required   Generalized weakness Moderate protein calorie malnutrition Adult failure to thrive Hypoalbuminemia Patient now representing to the hospital with weakness, and failure to thrive with decreased oral intake.  CT head negative for acute cranial abnormality.  MR brain no evidence of acute stroke.  Analysis unrevealing.  Unclear etiology, but there was thought to be a component of depression and psychiatry was consulted with recommendations of initiation of antidepressant therapy, but patient nor daughter interested at this time. --PT/OT recommends SNF, patient/daughter declined --Continue to encourage increased oral intake; patient with very little oral intake during hospitalization --Continue IV fluids --Continue Megace for appetite stimulation, patient currently refusing --Attempted NG tube placement 9/5 unsuccessful with multiple  attempts --Coretrak placement 01/03/2019; starting tube feeds --Overall prognosis has been discussed by multiple providers which is guarded to poor.  Palliative care consultation evaluation appreciated.  Despite aggressive measures, patient's condition continues to further decline as she is refusing most interventions and treatment at this time.  Despite multiple conversations with her daughter, she continues to request aggressive measures as she states her mother's only wishes are "to live"; which seems to be counter from what her mother's wishes are given her current behavior as she is currently refusing all interventions and treatment.  Have high suspicion that her mother suffers from severe depression with recent loss of her sister 2 months ago, although her daughter thinks that this Sydney [name] is completely wrong.  Diarrhea Patient started having increased frequency of diarrhea on 01/02/2019 with white count level less than 4.0 with a temperature 100.1.  Previous history of C. difficile colitis back in 2018 in which she received a fecal transplant.  Daughter concerned that this is a recurrence of her C. difficile.  Only recent antibiotic exposure was with Keflex during previous admission for UTI. --Enteric precautions --Will check C. difficile PCR and GI PCR panel  Odynophagia with possible oropharyngeal candidiasis Unable to use Diflucan because of increased LFTs.  Nystatin orally has been ordered but patient refuses all her meds.  GI following, appreciate input.  Elevated LFTs Patient presenting with notable elevated transaminitis.  Abdominal exam is benign.  Hepatitis panel negative. EBV and CMV IgM are negative.  Right upper quadrant ultrasound only showed gallbladder sludging.  CT abdomen/pelvis with no evidence of acute intra-abdominal or pelvic abnormality but with diffuse fluid within the colon suggestive of possible enteritis versus diarrheal illness.  KUB on 12/29/2018 with mild diffuse gaseous  prominence without obstructive  pattern.  Suspicion for possible mild shock liver given she was hypotensive on presentation.  GI also believes that this transaminitis could reflect from skeletal muscle release as well. --Gastroenterology now signed off as of 01/01/2019 --LFTs continue trending down, will repeat on 9/9  Pancytopenia Evaluated by hematology. Patient has been started on vitamin B12 parenterally as she had low levels of vitamin B12 during last admission but vitamin B12 level normal this admission.  No improvement with empiric vitamin B12 supplementation. --Outpatient follow-up with hematology.  No plans for bone marrow biopsy during this admission.  Hyponatremia Sodium 128 in the ED, likely from severe dehydration from poor oral intake and adult failure to thrive.  Sodium improved up to 141 with IV fluid hydration. --Continue supportive measures with IV fluid hydration given lack of oral intake. --s/p cortrack placement 9/8; will continue IVF until TF at goal then will dc  Hypokalemia Hypomagnesemia --Improved.  Repeat labs tomorrow  History of TIA  Patient has not received Plavix since 12/24/2018 as she has refused.  Stopped Plavix for now for possible need for EGD/liver biopsy or any sort of invasive procedure. --continue aspirin; although patient refusing all medications at this time  Stage II sacral pressure injury: Present on admission --continue wound care as per wound care consult recommendations.  Generalized deconditioning PT recommends SNF placement.  Daughter thinks that patient will get better and will be able to go home and declines SNF placement.  Ethics: Patient now representing after recent hospitalization, was home for roughly 1-2 days and returned with worsening weakness, severe dehydration, and adult failure to thrive.  Daughter believes that she just will require some nutrition through artificial means to get her back to her previous baseline. She states  that her mother was independent with her ADLs and cooking for herself.  Daughter states that her current decline only started on 12/28/2018; which is counter to all nursing reports and previous provider reports during her care during this hospitalization.  Daughter seems to be struggling with the notion that her mother is in the active dying process in regards to her adult failure to thrive in which she is refusing most if not all interventions and has not been eating or drinking now for approximately 11 days.  At this point, daughter agrees to proceed with cortrak tube placement to increase her nutrition, which was completed today.  Given this patient's lack response and repeat hospitalization in less than a few days from previous discharge, patient's prognosis is extremely grim, and this has been relayed to the patient's daughter on multiple occasions by multiple providers.   DVT prophylaxis: SCDs Code Status: Full code Family Communication: Discussed plan of care with patient's daughter extensively at bedside this morning. Disposition Plan: Continue inpatient, start tube feeds; poor/grim prognosis   Consultants:   Gastroenterology, [place]  Infectious disease  Hematology  Palliative care  Procedures: None   Antimicrobials: None   Subjective:  Patient seen and examined at bedside, resting comfortably.  Continues to be confused. Continues with very poor oral intake. Cortrak placed this morning; now starting tube feeds.  No change in patient's disposition either worsening/improving over the previous 7 days.  No acute events overnight per nursing staff.  Objective: Vitals:   01/02/19 2156 01/02/19 2245 01/03/19 0500 01/03/19 0647  BP: (!) 145/124 140/85  126/78  Pulse: (!) 116 95  97  Resp:      Temp: 99.2 F (37.3 C)   99.9 F (37.7 C)  TempSrc: Oral  Axillary  SpO2: 100%     Weight:   61 kg   Height:        Intake/Output Summary (Last 24 hours) at 01/03/2019 1338 Last data  filed at 01/03/2019 0854 Gross per 24 hour  Intake 1909.86 ml  Output -  Net 1909.86 ml   Filed Weights   12/25/18 0500 12/26/18 0500 01/03/19 0500  Weight: 50 kg 61 kg 61 kg    Examination:  General exam: Appears calm when resting but becomes agitated when asked questions or attempts at physical exam, thin and cachectic in appearance Respiratory system: Clear to auscultation. Respiratory effort normal. Cardiovascular system: S1 & S2 heard, RRR. No JVD, murmurs, rubs, gallops or clicks. No pedal edema. Gastrointestinal system: Abdomen is nondistended, soft with generalized tenderness. No organomegaly or masses felt. Normal bowel sounds heard.  Notable bilateral inguinal hernias easily reducible Central nervous system: Sleeping but arousable Extremities: Symmetric 5 x 5 power. Skin: No rashes, lesions or ulcers Psychiatry: Depressed mood, flat affect, confused    Data Reviewed: I have personally reviewed following labs and imaging studies  CBC: Recent Labs  Lab 12/28/18 0235 12/30/18 0338 12/31/18 0532 01/01/19 0359  WBC 1.5* 2.2* 2.6* 2.8*  NEUTROABS 0.9*  --   --   --   HGB 11.7* 10.4* 12.2 11.1*  HCT 36.1 32.6* 38.6 33.2*  MCV 96.0 96.4 98.7 93.8  PLT 85* 90* 82* 88*   Basic Metabolic Panel: Recent Labs  Lab 12/28/18 0235 12/30/18 0338 12/31/18 0532 01/01/19 0359  NA 136 141 141 141  K 3.9 5.0 5.0 4.7  CL 114* 124* 122* 123*  CO2 16* 15* 14* 14*  GLUCOSE 83 103* 108* 89  BUN 12 8 7* 7*  CREATININE 0.80 0.75 0.78 0.70  CALCIUM 7.7* 7.9* 8.5* 8.4*  MG 2.2  --   --   --    GFR: Estimated Creatinine Clearance: 41.8 mL/min (by C-G formula based on SCr of 0.7 mg/dL). Liver Function Tests: Recent Labs  Lab 12/28/18 0235 12/30/18 0338 12/31/18 0532 01/01/19 0359  AST 433* 297* 285* 237*  ALT 159* 114* 111* 94*  ALKPHOS 112 126 162* 150*  BILITOT 0.6 0.6 0.9 0.6  PROT 5.2* 5.0* 5.6* 5.3*  ALBUMIN 2.1* 2.0* 2.2* 2.0*   No results for input(s): LIPASE,  AMYLASE in the last 168 hours. No results for input(s): AMMONIA in the last 168 hours. Coagulation Profile: No results for input(s): INR, PROTIME in the last 168 hours. Cardiac Enzymes: Recent Labs  Lab 12/30/18 0338  CKTOTAL 254*   BNP (last 3 results) No results for input(s): PROBNP in the last 8760 hours. HbA1C: No results for input(s): HGBA1C in the last 72 hours. CBG: Recent Labs  Lab 01/01/19 0821 01/01/19 0858 01/02/19 0744 01/03/19 0810 01/03/19 1253  GLUCAP 68* 118* 75 89 89   Lipid Profile: No results for input(s): CHOL, HDL, LDLCALC, TRIG, CHOLHDL, LDLDIRECT in the last 72 hours. Thyroid Function Tests: No results for input(s): TSH, T4TOTAL, FREET4, T3FREE, THYROIDAB in the last 72 hours. Anemia Panel: No results for input(s): VITAMINB12, FOLATE, FERRITIN, TIBC, IRON, RETICCTPCT in the last 72 hours. Sepsis Labs: No results for input(s): PROCALCITON, LATICACIDVEN in the last 168 hours.  Recent Results (from the past 240 hour(s))  Culture, group A strep     Status: None   Collection Time: 12/25/18 11:27 AM   Specimen: Throat  Result Value Ref Range Status   Specimen Description THROAT  Final   Special Requests NONE  Final   Culture   Final    NO GROUP A STREP (S.PYOGENES) ISOLATED Performed at [facility], [place] [address] [place], [place] [address]    Report Status 12/28/2018 FINAL  Final         Radiology Studies: No results found.      Scheduled Meds: . aspirin EC  81 mg Oral Daily  . folic acid  1 mg Oral Daily  . Gerhardt's butt cream   Topical TID  . latanoprost  1 drop Both Eyes QHS  . [START ON 01/04/2019] megestrol  400 mg Per Tube Daily  . [START ON 01/04/2019] multivitamin with minerals  1 tablet Per Tube Daily  . nystatin  5 mL Oral QID  . pantoprazole  20 mg Oral Daily  . sodium chloride flush  3 mL Intravenous Q12H   Continuous Infusions: . sodium chloride    . dextrose 5 % and 0.45% NaCl 100 mL/hr at 01/03/19 0300  .  feeding supplement (OSMOLITE 1.2 CAL)       LOS: 14 days    Time spent: 32 minutes spent on chart review, discussion with nursing staff, consultants, personally reviewing all imaging studies and labs, and interview/physical exam; more than 50% of that time was spent in counseling and/or coordination of care.    Eric J [place] DO [facility] Pager [phone]  If 7PM-7AM, please contact night-coverage [email or web address] Password [email or web address] 01/03/2019, 1:38 PM

## 2019-01-03 NOTE — Progress Notes (Signed)
Nutrition Follow-up  DOCUMENTATION CODES:   Non-severe (moderate) malnutrition in context of chronic illness  INTERVENTION:   -D/c Boost Breeze -D/c Prostat -Continue MVI with minerals daily -Initiate Osmolite 1.2 @ 25 ml/hr and increase by 10 ml every 8 hours to goal rate of 45 ml/hr.   Tube feeding regimen provides 1296 kcal (100% of needs), 60 grams of protein, and 886 ml of H2O.   -Recommend monitor K, Mg, and Phos daily x 3 days and replete as needed secondary to high refeeding risk   NUTRITION DIAGNOSIS:   Moderate Malnutrition related to chronic illness(TIA) as evidenced by mild fat depletion, moderate fat depletion, mild muscle depletion, moderate muscle depletion.  Ongoing  GOAL:   Patient will meet greater than or equal to 90% of their needs  Unmet  MONITOR:   PO intake, Labs, Weight trends, Skin, I & O's, TF tolerance  REASON FOR ASSESSMENT:   Consult Enteral/tube feeding initiation and management  ASSESSMENT:   Sydney [name] is a 85 y.o. 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  Patient is accompanied by her daughter who assists with the history.  Patient has history of recurrent C. difficile colitis, has continued to have loose stools, though this has improved to 4 or 5/day, has had progressively worsening appetite, eating only 3 or 4 bites and unable to even drink half of a nutritional supplement drink.  Patient denies any abdominal pain since the recent hospitalization, no fevers or chills have been noted, and she denies any dysuria though she may have complained of this a few days ago.  She was requiring assistance with ambulation and had home health PT set up at recent discharge, but has  become even more weak in general since leaving the hospital and had a near syncopal episode today when she tried to get up from a bedside commode.  8/27- megace initiated, SLP recommend D3 diet with thin liquids 8/29- nystatin added for oral thrush 9/4- pt daughter agreeable to TF 9/5- NGT placement unsuccessful x 2 (RN bedside placement0 9/7- cortrak tube placed; tip of tube confirmed in stomach  Reviewed I/O's: +1.9 L x 24 hours and +10.4 L since admission   Pt with minimal progress since last week. Per RN, pt is refusing most care. She has been refusing PO's, medications, and supplements. Daughter amenable to cortrak placement today. Case also discussed with MD via secure chat; plan to start TF today.   Reviewed wt, which has increased since admission, suspect may be related ot edema (pt +10.4 L positive since admission).   Medications reviewed and include dextrose 5%-0.45% sodium chloride infusion @ 100 ml/hr and megace.   Labs reviewed: CBGS: 68-118.   Diet Order:   Diet Order            DIET DYS 3 Room service appropriate? Yes; Fluid consistency: Thin  Diet effective now              EDUCATION NEEDS:   Not appropriate for education at this time  Skin:  Skin Assessment: Skin Integrity Issues: Skin Integrity Issues:: Stage II Stage II: sacrum, coccyx  Last BM:  01/02/19  Height:   Ht Readings from Last 1 Encounters:  12/20/18 5\' 3"  (1.6 m)    Weight:   Wt Readings from  Last 1 Encounters:  01/03/19 61 kg    Ideal Body Weight:  52.3 kg  BMI:  Body mass index is 23.82 kg/m.  Estimated Nutritional Needs:   Kcal:  1250-1450  Protein:  60-75 grams  Fluid:  > 1.2 L    Vercie Pokorny A. Jimmye Norman, RD, LDN, [place] Registered Dietitian II Certified Diabetes Care and Education Specialist Pager: [phone] After hours Pager: [phone]

## 2019-01-03 NOTE — Progress Notes (Signed)
Throughout the night the patient called out for help repeatedly and was screaming. This RN and the tech went in there on multiple times to check on the patient. The patient would not respond to our questions or would continue screaming while we were in the room. She was also very resistant to staff cleaning her up during her incontinent episodes. She would scream and grab at staff trying to get [place] to stop and required at least 2 staff members due to her agitation. Will report this to the oncoming shift.

## 2019-01-03 NOTE — Plan of Care (Signed)
  Problem: Education: Goal: Knowledge of General Education information will improve Description: Including pain rating scale, medication(s)/side effects and non-pharmacologic comfort measures Outcome: Progressing   Problem: Health Behavior/Discharge Planning: Goal: Ability to manage health-related needs will improve Outcome: Progressing   Problem: Clinical Measurements: Goal: Ability to maintain clinical measurements within normal limits will improve Outcome: Progressing Goal: Respiratory complications will improve Outcome: Progressing   Problem: Nutrition: Goal: Adequate nutrition will be maintained Outcome: Progressing   Problem: Coping: Goal: Level of anxiety will decrease Outcome: Progressing   Problem: Pain Managment: Goal: General experience of comfort will improve Outcome: Progressing   Problem: Safety: Goal: Ability to remain free from injury will improve Outcome: Progressing   Problem: Skin Integrity: Goal: Risk for impaired skin integrity will decrease Outcome: Progressing

## 2019-01-03 NOTE — Plan of Care (Signed)
  Problem: Clinical Measurements: Goal: Ability to maintain clinical measurements within normal limits will improve Outcome: Progressing Goal: Cardiovascular complication will be avoided Outcome: Progressing   Problem: Elimination: Goal: Will not experience complications related to urinary retention Outcome: Progressing   

## 2019-01-03 NOTE — Procedures (Signed)
Cortrak  Tube Type:  Cortrak - 43 inches Tube Location:  Left nare Initial Placement:  Stomach Secured by: Tape Technique Used to Measure Tube Placement:  Documented cm marking at nare/ corner of mouth Cortrak Secured At:  75 cm    Cortrak Tube Team Note:  Consult received to place a Cortrak feeding tube.   No x-ray is required. RN may begin using tube.   If the tube becomes dislodged please keep the tube and contact the Cortrak team at [email or web address] (password [email or web address]) for replacement.  If after hours and replacement cannot be delayed, place a NG tube and confirm placement with an abdominal x-ray.    Koleen Distance MS, RD, LDN Pager #- [phone] Office#- [phone] After Hours Pager: [phone]

## 2019-01-03 NOTE — Progress Notes (Signed)
Occupational Therapy Treatment Patient Details Name: LIVIAN SANDLIN MRN: [id number] DOB: 05-24-1930 Today's Date: 01/03/2019    History of present illness KESTREL BEEVER is a 85 y.o. 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   OT comments  Pt continues to be MAX- total assist for functional transfers and ADLs, however family persistent on continued skilled therapy. Education and demonstration provided to pts daughter on PROM stretches to BUE and benefits of sitting pt upright as much as possible when therapist is not present. Pt with incontinent BM during session. Total assist for pericare. Transferred pt supine>EOB with total assist to clean bed. Pt MOD A to sit EOB while bed was cleaned. DC plan remains appropriate. Will continue to follow for acute OT needs.   Follow Up Recommendations  SNF;Supervision/Assistance - 24 hour    Equipment Recommendations  Other (comment)(tbd to necxt venue)    Recommendations for Other Services      Precautions / Restrictions Precautions Precautions: Fall Restrictions Weight Bearing Restrictions: No Other Position/Activity Restrictions: feeding tube placed today 9/8       Mobility Bed Mobility Overal bed mobility: Needs Assistance Bed Mobility: Supine to Sit     Supine to sit: Total assist Sit to supine: Max assist;+2 for physical assistance   General bed mobility comments: total assist to initiate moving legs; elevating trunk; scooting hips to EOB; +2 MAX for sit>supine as pt heavily pushing backwards  Transfers                 General transfer comment: unable to perform transfer this session, pt actively resisting therapist facilitation    Balance Overall balance  assessment: Needs assistance Sitting-balance support: Bilateral upper extremity supported;Feet supported Sitting balance-Leahy Scale: Poor Sitting balance - Comments: fluctuating between MIN - MOD assist for static sitting d/t heavy posterior lean Postural control: Posterior lean                                 ADL either performed or assessed with clinical judgement   ADL Overall ADL's : Needs assistance/impaired   Eating/Feeding Details (indicate cue type and reason): feeding tube                       Toilet Transfer Details (indicate cue type and reason): incontient BM in bed Toileting- Clothing Manipulation and Hygiene: Total assistance;+2 for physical assistance;Bed level Toileting - Clothing Manipulation Details (indicate cue type and reason): TOTAL +2 for pericare after BM in bed     Functional mobility during ADLs: Maximal assistance;Total assistance General ADL Comments: pt limited by generalized weakness and all over pain     Vision Baseline Vision/History: Wears glasses Patient Visual Report: No change from baseline Vision Assessment?: No apparent visual deficits   Perception     Praxis      Cognition Arousal/Alertness: Awake/alert Behavior During Therapy: Restless;Agitated Overall Cognitive Status: Impaired/Different from baseline Area of Impairment: Attention;Following commands;Problem solving;Awareness                   Current Attention Level: Selective   Following Commands: Follows one step commands with increased time;Follows one step commands inconsistently   Awareness: Emergent Problem Solving: Decreased initiation;Requires tactile cues;Requires  verbal cues General Comments: pt in great discomfort most of session; intermittent times where pt would respond to commands, but overall limited by restlessness and pain all over        Exercises General Exercises - Upper Extremity Shoulder Flexion: Both;10  reps;Supine;AAROM Shoulder Extension: Both;10 reps;Supine;AAROM Elbow Flexion: Both;10 reps;Supine;AAROM Elbow Extension: Both;10 reps;Supine;AAROM   Shoulder Instructions       General Comments Pt daugher present throughout session stating how much she wanted therapy to continue. educated daughter on PROM BUE stretching and benefits of positioning pt upright even when therapy not in room.    Pertinent Vitals/ Pain       Pain Assessment: Faces Faces Pain Scale: Hurts whole lot Pain Descriptors / Indicators: Grimacing;Moaning;Guarding;Constant;Crying;Sore;Restless Pain Intervention(s): Monitored during session;Repositioned;Limited activity within patient's tolerance  Home Living                                          Prior Functioning/Environment              Frequency  Min 2X/week        Progress Toward Goals  OT Goals(current goals can now be found in the care plan section)  Progress towards OT goals: Not progressing toward goals - comment(pt continues to be limited by pain and overall weakness; family persistent on continued therapy)  Acute Rehab OT Goals Time For Goal Achievement: 01/05/19 Potential to Achieve Goals: Fair  Plan      Co-evaluation                 AM-PAC OT "6 Clicks" Daily Activity     Outcome Measure   Help from another person eating meals?: Total Help from another person taking care of personal grooming?: Total Help from another person toileting, which includes using toliet, bedpan, or urinal?: Total Help from another person bathing (including washing, rinsing, drying)?: Total Help from another person to put on and taking off regular upper body clothing?: Total Help from another person to put on and taking off regular lower body clothing?: Total 6 Click Score: 6    End of Session    OT Visit Diagnosis: Muscle weakness (generalized) (M62.81);Unsteadiness on feet (R26.81);Other symptoms and signs involving  cognitive function   Activity Tolerance Patient limited by pain;Patient limited by fatigue   Patient Left in bed;with call bell/phone within reach;with family/visitor present   Nurse Communication Mobility status(BM during session)        Time: [id number] OT Time Calculation (min): 31 min  Charges: OT General Charges $OT Visit: 1 Visit OT Treatments $Self Care/Home Management : 23-37 mins  Aileen Pilot, [facility] [phone]

## 2019-01-04 ENCOUNTER — Telehealth: Payer: Self-pay | Admitting: Internal Medicine

## 2019-01-04 DIAGNOSIS — R627 Adult failure to thrive: Secondary | ICD-10-CM | POA: Diagnosis not present

## 2019-01-04 DIAGNOSIS — R945 Abnormal results of liver function studies: Secondary | ICD-10-CM | POA: Diagnosis not present

## 2019-01-04 LAB — GASTROINTESTINAL PANEL BY PCR, STOOL (REPLACES STOOL CULTURE)

## 2019-01-04 LAB — CBC
HCT: 34.5 % — ABNORMAL LOW (ref 36.0–46.0)
Hemoglobin: 11 g/dL — ABNORMAL LOW (ref 12.0–15.0)
MCH: 31.2 pg (ref 26.0–34.0)
MCHC: 31.9 g/dL (ref 30.0–36.0)
MCV: 97.7 fL (ref 80.0–100.0)
Platelets: 86 10*3/uL — ABNORMAL LOW (ref 150–400)
RBC: 3.53 MIL/uL — ABNORMAL LOW (ref 3.87–5.11)
RDW: 16.2 % — ABNORMAL HIGH (ref 11.5–15.5)
WBC: 3 10*3/uL — ABNORMAL LOW (ref 4.0–10.5)
nRBC: 0.7 % — ABNORMAL HIGH (ref 0.0–0.2)

## 2019-01-04 LAB — COMPREHENSIVE METABOLIC PANEL
ALT: 92 U/L — ABNORMAL HIGH (ref 0–44)
AST: 247 U/L — ABNORMAL HIGH (ref 15–41)
Albumin: 1.9 g/dL — ABNORMAL LOW (ref 3.5–5.0)
Alkaline Phosphatase: 165 U/L — ABNORMAL HIGH (ref 38–126)
Anion gap: 5 (ref 5–15)
BUN: 8 mg/dL (ref 8–23)
CO2: 13 mmol/L — ABNORMAL LOW (ref 22–32)
Calcium: 8.1 mg/dL — ABNORMAL LOW (ref 8.9–10.3)
Chloride: 112 mmol/L — ABNORMAL HIGH (ref 98–111)
Creatinine, Ser: 0.75 mg/dL (ref 0.44–1.00)
GFR calc Af Amer: >60 mL/min (ref >60)
GFR calc non Af Amer: >60 mL/min (ref >60)
Glucose, Bld: 112 mg/dL — ABNORMAL HIGH (ref 70–99)
Potassium: 3.3 mmol/L — ABNORMAL LOW (ref 3.5–5.1)
Sodium: 130 mmol/L — ABNORMAL LOW (ref 135–145)
Total Bilirubin: 0.8 mg/dL (ref 0.3–1.2)
Total Protein: 5.3 g/dL — ABNORMAL LOW (ref 6.5–8.1)

## 2019-01-04 LAB — PHOSPHORUS: Phosphorus: 2.5 mg/dL (ref 2.5–4.6)

## 2019-01-04 LAB — MAGNESIUM: Magnesium: 1.5 mg/dL — ABNORMAL LOW (ref 1.7–2.4)

## 2019-01-04 LAB — GLUCOSE, CAPILLARY
Glucose-Capillary: 104 mg/dL — ABNORMAL HIGH (ref 70–99)
Glucose-Capillary: 103 mg/dL — ABNORMAL HIGH (ref 70–99)
Glucose-Capillary: 126 mg/dL — ABNORMAL HIGH (ref 70–99)
Glucose-Capillary: 108 mg/dL — ABNORMAL HIGH (ref 70–99)
Glucose-Capillary: 116 mg/dL — ABNORMAL HIGH (ref 70–99)

## 2019-01-04 MED ORDER — TRAMADOL HCL 50 MG PO TABS
50.0000 mg | ORAL_TABLET | Freq: Once | ORAL | Status: AC
  Administered 2019-01-04: 50 mg via ORAL
  Filled 2019-01-04: qty 1

## 2019-01-04 MED ORDER — LORAZEPAM 2 MG/ML IJ SOLN
0.2500 mg | Freq: Once | INTRAMUSCULAR | Status: AC
  Administered 2019-01-04: 22:00:00 0.25 mg via INTRAVENOUS
  Filled 2019-01-04: qty 1

## 2019-01-04 NOTE — Plan of Care (Signed)
  Problem: Elimination: Goal: Will not experience complications related to bowel motility 01/04/2019 0426 by Forestine Chute, RN Outcome: Progressing   Problem: Elimination: Goal: Will not experience complications related to urinary retention 01/04/2019 0426 by Forestine Chute, RN Outcome: Progressing   Problem: Safety: Goal: Ability to remain free from injury will improve Outcome: Progressing   Problem: Nutrition: Goal: Adequate nutrition will be maintained 01/04/2019 0426 by Forestine Chute, RN Outcome: Progressing

## 2019-01-04 NOTE — Progress Notes (Signed)
Nutrition Follow-up  DOCUMENTATION CODES:   Non-severe (moderate) malnutrition in context of chronic illness  INTERVENTION:   -COntinueOsmolite 1.2@ 16ml/hr and increase by 10 ml every 8hours to goal rate of 85ml/hr.   Tube feeding regimen provides1296kcal (100% of needs),60grams of protein, and 883ml of H2O.   -Recommend monitor K, Mg, and Phos daily x 3 days and replete as needed secondary to high refeeding risk  NUTRITION DIAGNOSIS:   Moderate Malnutrition related to chronic illness(TIA) as evidenced by mild fat depletion, moderate fat depletion, mild muscle depletion, moderate muscle depletion.  Ongoing  GOAL:   Patient will meet greater than or equal to 90% of their needs  Progressing   MONITOR:   PO intake, Labs, Weight trends, Skin, I & O's, TF tolerance  REASON FOR ASSESSMENT:   Consult Enteral/tube feeding initiation and management  ASSESSMENT:   Sydney [name] is a 85 y.o. 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  Patient is accompanied by her daughter who assists with the history.  Patient has history of recurrent C. difficile colitis, has continued to have loose stools, though this has improved to 4 or 5/day, has had progressively worsening appetite, eating only 3 or 4 bites and unable to even drink half of a nutritional supplement drink.  Patient denies any abdominal pain since the recent hospitalization, no fevers or chills have been noted, and she denies any dysuria though she may have complained of this a few days ago.  She was requiring assistance with ambulation and had home health PT set up at recent discharge, but has become even more weak in general since leaving the hospital  and had a near syncopal episode today when she tried to get up from a bedside commode.  8/27- megace initiated, SLP recommend D3 diet with thin liquids 8/29- nystatin added for oral thrush 9/4- pt daughter agreeable to TF 9/5- NGT placement unsuccessful x 2 (RN bedside placement0 9/7- cortrak tube placed; tip of tube confirmed in stomach  Reviewed I/O's: +2.8 L x 24 hours and +13.1 L since 12/21/18  Pt receiving nursing care at time of visit. Noted pt yelling and screaming.   Per GI notes, pt tolerating TF well. Osmolite 1.2 currently infusing via cortrak tube @ 40 ml/hr (goal rate 45 ml/hr). Regimen currently providing 1152 kcals, 53 grams protein, and 787 ml free water, meeting 92% of estimated kcal needs and 88% of estimated protein needs.   Medications reviewed and include megace and dextrose 5%-0.45% sodium chloride infusion @ 100 ml/hr.   Labs reviewed: CBGS: 102-104. K: 3.3, Mg: 1.5, Phos: 2.5.   Diet Order:   Diet Order            DIET DYS 3 Room service appropriate? Yes; Fluid consistency: Thin  Diet effective now              EDUCATION NEEDS:   Not appropriate for education at this time  Skin:  Skin Assessment: Skin Integrity Issues: Skin Integrity Issues:: Stage II Stage II: sacrum, coccyx  Last BM:  01/03/19  Height:   Ht Readings from Last 1 Encounters:  12/20/18 5\' 3"  (1.6 m)    Weight:   Wt Readings from Last 1 Encounters:  01/04/19 61 kg    Ideal Body Weight:  52.3 kg  BMI:  Body mass index is 23.82 kg/m.  Estimated Nutritional Needs:   Kcal:  1250-1450  Protein:  60-75 grams  Fluid:  > 1.2 L    Sydney [name] A. Jimmye Norman, RD, LDN, [place] Registered Dietitian II Certified Diabetes Care and Education Specialist Pager: [phone] After hours Pager: [phone]

## 2019-01-04 NOTE — Telephone Encounter (Signed)
Dr. Tarri Glenn' note said we would recheck 9/7 so I would ask that the hospital team check her again at least once.  Thanks to the team.

## 2019-01-04 NOTE — Progress Notes (Signed)
While waiting on nurse to be sent to unit charge nurse was called in by patients daughter to see patient. Patient screaming and hollering while holding private area in pain. Assessed patient, noticed private area reddened and irritated. Patient incontinent of urine and bowel. Paged MD to see if possible foley can be placed and something to be given for pain.

## 2019-01-04 NOTE — Progress Notes (Addendum)
Daily Rounding Note  01/04/2019, 1:35 PM  LOS: 15 days   SUBJECTIVE:   Chief complaint:  FTT.      Patient continues to refuse medications and most interventions. coretrac FT placed yesterday.  Formula titrated from 25 to 40 ml/hour, goal is 45/ml/hour.  This allowing for administration of Megace and MVI.  No N/V.    Chronic loose but not frequent stools, increased diarrhea since 9/7.  3 liquid, yellow stools this AM. Moaning and screaming out as staff attempts to clean her, reposition, etc.    OBJECTIVE:         Vital signs in last 24 hours:    Temp:  [99.8 F (37.7 C)-100.8 F (38.2 C)] 99.8 F (37.7 C) (09/09 0537) Pulse Rate:  [99-107] 99 (09/09 0537) Resp:  [20-21] 20 (09/09 0537) BP: (114-126)/(62-67) 120/62 (09/09 0537) SpO2:  [96 %-100 %] 100 % (09/09 0537) Weight:  [61 kg] 61 kg (09/09 0500) Last BM Date: 01/03/19 Filed Weights   12/26/18 0500 01/03/19 0500 01/04/19 0500  Weight: 61 kg 61 kg 61 kg   General: began moaning and distressed as covers removed to begin exam.  Cachectic   Heart: RRR Chest: no labored breathing Abdomen: soft, ? Tender as she moans with palpation anywhere Thick clear, yellow liquid stool ( bit less thick than syrup) pooling between her legs  Extremities: no LE edema. Neuro/Psych:  Minimal verbal  Response, mostly just moans.  No following commands but does move arms, hands, legs.     Intake/Output from previous day: 09/08 0701 - 09/09 0700 In: 2788.2 [I.V.:2451.1; NG/GT:337.1] Out: -   Intake/Output this shift: No intake/output data recorded.  Lab Results: Recent Labs    01/04/19 0134  WBC 3.0*  HGB 11.0*  HCT 34.5*  PLT 86*   BMET Recent Labs    01/04/19 0134  NA 130*  K 3.3*  CL 112*  CO2 13*  GLUCOSE 112*  BUN 8  CREATININE 0.75  CALCIUM 8.1*   LFT Recent Labs    01/04/19 0134  PROT 5.3*  ALBUMIN 1.9*  AST 247*  ALT 92*  ALKPHOS 165*  BILITOT 0.8     Studies/Results: No results found.   Scheduled Meds: . aspirin EC  81 mg Oral Daily  . folic acid  1 mg Oral Daily  . Gerhardt's butt cream   Topical TID  . latanoprost  1 drop Both Eyes QHS  . megestrol  400 mg Per Tube Daily  . multivitamin with minerals  1 tablet Per Tube Daily  . nystatin  5 mL Oral QID  . pantoprazole  20 mg Oral Daily  . sodium chloride flush  3 mL Intravenous Q12H   Continuous Infusions: . sodium chloride    . dextrose 5 % and 0.45% NaCl 100 mL/hr at 01/04/19 1255  . feeding supplement (OSMOLITE 1.2 CAL) 1,000 mL (01/03/19 1431)   PRN Meds:.sodium chloride, acetaminophen **OR** acetaminophen, LORazepam, magic mouthwash, ondansetron **OR** ondansetron (ZOFRAN) IV, sodium chloride flush   ASSESMENT:   *Elevated transaminases, AST >>ALT.  No source revealed at [place], CTAP. Serologic, lab autoimmune, viral assays unrevealing. ? Skeletal source of AST elevation?  Note that if this is musculoskeletal, the transaminitis began before she became bedbound but coincides with onset of weakness. Transaminases continue to improve while alkaline phosphatase rising.  T bili consistently normal.  *     Abdominal pain.  CTAP w CM 8/25: diffuse fluid in colon  s/o possible enteritis vs diarrheal illness. KUB  showing non-obstructive, mild, diffuse gaseous prominence of bowel, mild ileus versus enteritis.   Years of chronic loose stools evolving to more of a diarrheal picture in the last 3 days. C diff and stool path panel negative 9/8.   ? malabsorbtion in the setting of bowel wall edema, malnutrition?  * Pancytopenia. Heme evaluated and signed off. Previous low B12 level normalized with supplementation.   *    Electrolyte abnormalities.  Hypokalemia, hypomagnesemia, hyponatremia  * Malnutrition (pre albumin <5) with pt refusing meals and nutritional supplements.  TF started 9/8 and tolerating thus far on day 2.   Although mentioned in some notes, do  not get sense pt has odynophagia. Refuses nystatin for management of suspected oral candidiasis and Diflucan avoided in setting of abnormal LFTs.    *    Bilateral pleural effusions per CT 8/25.  Small bilateral effusions on KUB 9/3.  * Anorexia, FTT, malnutrition, behavioral changes. Continues to decline, refusing meds and nursing care. Depression suspected with decline beginning after death of older sister in 2018/11/09.  Dtr did not want Remeron started.  Head CT and MR unrevealing.     PLAN   *   See Dr Doyne Keel impression and plans.     *   ? Add antidiarrheal?   Add Nystatin suspension VT?  However this bypasses the topical oral benefit so not clear how useful it would be.   Revisit starting Remeron now that FT is in place?     Azucena Freed  01/04/2019, 1:35 PM Phone [phone]

## 2019-01-04 NOTE — Progress Notes (Signed)
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 CT of the abdomen and pelvis was negative for acute intra-abdominal or pelvic abnormality. She was found to have mildly elevated LFTs along with hyponatremia and pancytopenia. She was given IV fluids. ID and GI were consulted.  Palliative care was subsequently consulted for goals of care discussion.  Assessment & Plan:   Principal Problem:   Adult failure to thrive Active Problems:   Pancytopenia (HCC)   Hyponatremia   Protein calorie malnutrition (HCC)   History of TIA (transient ischemic attack)   Generalized weakness   Pressure injury of skin   Transaminitis   Malnutrition of moderate degree   Evaluation by psychiatric service required   Failure to thrive in an adult, severe protein caloric malnutrition, hypoalbuminemia. POA, ongoing - Patient now representing to the hospital with weakness, and failure to thrive with decreased oral intake.   - CT head negative for acute cranial abnormality.   - MR brain no evidence of acute stroke.   - Unclear etiology, but there was thought to be a component of depression and psychiatry was consulted with recommendations of initiation of antidepressant therapy, but patient nor daughter interested at this time. - PT/OT recommends SNF, patient/daughter declined - Continue to encourage increased oral intake; patient with very little oral intake during hospitalization - Continue IV fluids/coretrak tube feeds - Continue Megace for appetite stimulation, patient currently refusing - Attempted NG tube placement 9/5 unsuccessful with  multiple attempts - Coretrak placement 01/03/2019; starting tube feeds - Overall prognosis has been discussed by multiple providers which is guarded to poor. Palliative care consultation evaluation appreciated.  Despite aggressive measures, patient's condition continues to further decline as she is refusing most interventions and treatment at this time.  - Despite multiple conversations with her daughter, who continues to request aggressive measures as she states her mother's only wishes are "to live"; while the patient continues refusing all interventions and treatment.   -We have high suspicion that her mother suffers from severe depression with recent loss of her sister 2 months ago, although her daughter thinks that this Link Snuffer is completely wrong  Acute metabolic encephalopathy, likely secondary to above  -Patient today unable to assess orientation, previously was at least alert to person/situation per previous documentation -At this time unclear etiology other than poor p.o. intake and failure to thrive. -Patient did receive a small dose lorazepam previously, this is been discontinued, patient has been refusing all medications for the past 48 hours, unlikely polypharmacy -Consider seroquel via NG in the next 24-48h if patient continues to be altered - possible sundowning/delirium   Diarrhea, CDiff ruled out - Patient started having increased frequency of diarrhea on 01/02/2019 with white count level less than 4.0 with a temperature 100.1 - Previous history of C. difficile colitis back in 2018 in which she received a fecal transplant.   - Daughter concerned that this is a recurrence of her C. difficile.   - Only recent antibiotic exposure was with Keflex during previous admission for UTI. - Enteric precautions  Odynophagia with possible oropharyngeal candidiasis - Unable to use Diflucan because of increased LFTs.  Nystatin orally has been ordered but  patient refuses all her meds.  GI following,  appreciate input.  Elevated LFTs - Patient presenting with notable elevated transaminitis.   - Hepatitis panel negative. EBV and CMV IgM are negative.   - Right upper quadrant ultrasound only showed gallbladder sludging.   - CT abdomen/pelvis with no evidence of acute intra-abdominal or pelvic abnormality but with diffuse fluid within the colon suggestive of possible enteritis versus diarrheal illness. - KUB on 12/29/2018 with mild diffuse gaseous prominence without obstructive pattern.  Suspicion for possible mild shock liver given she was hypotensive on presentation.   - GI also believes that this transaminitis could reflect from skeletal muscle release as well. - Gastroenterology now signed off as of 01/01/2019 - LFTs generally trending down with improved nutrition and free water flushes  Pancytopenia, improving - Evaluated by hematology.  - Patient has been started on vitamin B12 parenterally as she had low levels of vitamin B12 during last admission but vitamin B12 level normal this admission.   - Minimally improving at this point with nutrition, unlikely affected by B12 given timing. - Outpatient follow-up with hematology.  No plans for bone marrow biopsy during this admission.  Hyponatremia, likely hypovolemic - Sodium 128 at admission likely in the setting of poor p.o. intake and dehydration - Continues to improve with IV fluid, free water flushes via NG tube and tube feeding  Hypokalemia, Hypomagnesemia in the setting of poor p.o. intake -Improving, follow daily labs  History of TIA  - Patient has not received Plavix since 12/24/2018 as she has refused.  Stopped Plavix for now for possible need for EGD/liver biopsy or any sort of invasive procedure. - Continue aspirin; although patient refusing all medications at this time  Stage II sacral pressure injury: Present on admission - Continue wound care as per wound care consult recommendations.  Generalized deconditioning - PT  recommends SNF placement.  - Daughter thinks that patient will get better and will be able to go home and declines SNF placement.  Ethics: Patient now representing after recent hospitalization, was home for roughly 1-2 days and returned with worsening weakness, severe dehydration, and adult failure to thrive.  Daughter believes that she just will require some nutrition through artificial means to get her back to her previous baseline. She states that her mother was independent with her ADLs and cooking for herself -unclear timeframe.  Daughter states that her current decline only started on 12/28/2018; which is counter to all nursing reports and previous provider reports during her care during this hospitalization.  Daughter seems to be struggling with the notion that her mother is in the active dying process in regards to her adult failure to thrive in which she is refusing most if not all interventions and has not been eating or drinking now for approximately 11 days.  At this point, daughter agrees to proceed with cortrak tube placement to increase her nutrition, which was completed today.  Given this patient's lack response and repeat hospitalization in less than a few days from previous discharge, patient's prognosis is extremely grim, and this has been relayed to the patient's daughter on multiple occasions by multiple providers.   DVT prophylaxis: SCDs Code Status: Full code Family Communication: Discussed plan of care with patient's daughter extensively over the phone Disposition Plan: Continue inpatient, tube feeds; poor/grim prognosis   Consultants:   Gastroenterology, [place]  Infectious disease  Hematology  Palliative care  Procedures: None  Antimicrobials: None  Subjective: No acute issues or events overnight, patient somewhat  combative this morning, not interactive.  Continues to moan, when asked if she was in pain she replied " no, go away" but remainder of exam extremely  limited.  Review of systems unable to be performed.  Objective: Vitals:   01/03/19 1636 01/04/19 0401 01/04/19 0500 01/04/19 0537  BP: 114/67 126/64  120/62  Pulse: (!) 107 (!) 102  99  Resp: 20 (!) 21  20  Temp:  (!) 100.8 F (38.2 C)  99.8 F (37.7 C)  TempSrc:  Axillary  Axillary  SpO2: 96% 100%  100%  Weight:   61 kg   Height:        Intake/Output Summary (Last 24 hours) at 01/04/2019 0831 Last data filed at 01/04/2019 0400 Gross per 24 hour  Intake 2788.16 ml  Output -  Net 2788.16 ml   Filed Weights   12/26/18 0500 01/03/19 0500 01/04/19 0500  Weight: 61 kg 61 kg 61 kg    Examination:  General: Cachectic elderly appearing female, appears somewhat agitated, moaning in bed, poorly interactive, only verbal response during interview was "no, go away" HEENT:  Normocephalic atraumatic.  Sclerae nonicteric, noninjected.  Extraocular movements intact bilaterally.  Cortrak NG tube in place Neck:  Without mass or deformity.  Trachea is midline. Lungs:  Clear to auscultate bilaterally without rhonchi, wheeze, or rales. Heart:  Regular rate and rhythm.  Without murmurs, rubs, or gallops. Abdomen:  Soft, nontender, nondistended.  Without guarding or rebound. Extremities: Without cyanosis, clubbing, edema, or obvious deformity. Vascular:  Dorsalis pedis and posterior tibial pulses palpable bilaterally. Skin:  Warm and dry, no erythema, no ulcerations.   Data Reviewed: I have personally reviewed following labs and imaging studies  CBC: Recent Labs  Lab 12/30/18 0338 12/31/18 0532 01/01/19 0359 01/04/19 0134  WBC 2.2* 2.6* 2.8* 3.0*  HGB 10.4* 12.2 11.1* 11.0*  HCT 32.6* 38.6 33.2* 34.5*  MCV 96.4 98.7 93.8 97.7  PLT 90* 82* 88* 86*   Basic Metabolic Panel: Recent Labs  Lab 12/30/18 0338 12/31/18 0532 01/01/19 0359 01/04/19 0134  NA 141 141 141 130*  K 5.0 5.0 4.7 3.3*  CL 124* 122* 123* 112*  CO2 15* 14* 14* 13*  GLUCOSE 103* 108* 89 112*  BUN 8 7* 7* 8   CREATININE 0.75 0.78 0.70 0.75  CALCIUM 7.9* 8.5* 8.4* 8.1*  MG  --   --   --  1.5*  PHOS  --   --   --  2.5   GFR: Estimated Creatinine Clearance: 41.8 mL/min (by C-G formula based on SCr of 0.75 mg/dL). Liver Function Tests: Recent Labs  Lab 12/30/18 0338 12/31/18 0532 01/01/19 0359 01/04/19 0134  AST 297* 285* 237* 247*  ALT 114* 111* 94* 92*  ALKPHOS 126 162* 150* 165*  BILITOT 0.6 0.9 0.6 0.8  PROT 5.0* 5.6* 5.3* 5.3*  ALBUMIN 2.0* 2.2* 2.0* 1.9*   Cardiac Enzymes: Recent Labs  Lab 12/30/18 0338  CKTOTAL 254*   CBG: Recent Labs  Lab 01/03/19 1253 01/03/19 1630 01/03/19 2008 01/04/19 0356 01/04/19 0821  GLUCAP 89 96 102* 104* 103*    Recent Results (from the past 240 hour(s))  Culture, group A strep     Status: None   Collection Time: 12/25/18 11:27 AM   Specimen: Throat  Result Value Ref Range Status   Specimen Description THROAT  Final   Special Requests NONE  Final   Culture   Final    NO GROUP A STREP (S.PYOGENES) ISOLATED Performed at [facility], [place] [address] [place], [place] [address]    Report Status 12/28/2018 FINAL  Final  C difficile quick scan w PCR reflex     Status: None   Collection Time: 01/03/19  5:36 PM   Specimen: Stool  Result Value Ref Range Status   C Diff antigen NEGATIVE NEGATIVE Final   C Diff toxin NEGATIVE NEGATIVE Final   C Diff interpretation No C. difficile detected.  Final    Comment: Performed at [facility], [place] [address] [place], [place] [address]     Radiology Studies: No results found.  Scheduled Meds: . aspirin EC  81 mg Oral Daily  . folic acid  1 mg Oral Daily  . Gerhardt's butt cream   Topical TID  . latanoprost  1 drop Both Eyes QHS  . megestrol  400 mg Per Tube Daily  . multivitamin with minerals  1 tablet Per Tube Daily  . nystatin  5 mL Oral QID  . pantoprazole  20 mg Oral Daily  . sodium chloride flush  3 mL Intravenous Q12H   Continuous Infusions: . sodium chloride    .  dextrose 5 % and 0.45% NaCl 100 mL/hr at 01/04/19 0400  . feeding supplement (OSMOLITE 1.2 CAL) 1,000 mL (01/03/19 1431)     LOS: 15 days    Time spent: >30; more than 20 minutes was spent at bedside discussing case with family, daughter.  Lengthy discussion about patient's poor prognosis, multiple morbidities, ongoing poor mental status changes with what appears to be worsening mental status despite improvement in nutrition and free water.  Little Ishikawa, DO [facility] Pager [phone]  If 7PM-7AM, please contact night-coverage [email or web address] Password [email or web address] 01/04/2019, 8:31 AM

## 2019-01-04 NOTE — Telephone Encounter (Signed)
Patient's daughter has questions about mom.  Looks like we may have signed off, but the daughter was under the impression that GI would be back on 01/02/19.  I explained that last notes in the chart indicate that the patient declined MRI and did not want any endoscopic procedures at this time.  She thanked me for the call and wanted to express her gratitude for Dr. Carlean Purl and Dr. Havery Moros.  Dr. Carlean Purl should I reach out to Dr. Havery Moros to go by and see her? She felt Judson Roch wanted the physician to really information last week that she couldn't.

## 2019-01-04 NOTE — Plan of Care (Signed)

## 2019-01-04 NOTE — Telephone Encounter (Signed)
Pts is in the hospital and pts daughter states  several of our Doctors have  Been to see her,  However she hasn't seen anyone latley and wants to have some closure as to what is going on.  Please advise

## 2019-01-05 ENCOUNTER — Telehealth: Payer: Self-pay

## 2019-01-05 DIAGNOSIS — R627 Adult failure to thrive: Secondary | ICD-10-CM | POA: Diagnosis not present

## 2019-01-05 DIAGNOSIS — R945 Abnormal results of liver function studies: Secondary | ICD-10-CM | POA: Diagnosis not present

## 2019-01-05 LAB — CBC
HCT: 27.9 % — ABNORMAL LOW (ref 36.0–46.0)
Hemoglobin: 9.8 g/dL — ABNORMAL LOW (ref 12.0–15.0)
MCH: 31.6 pg (ref 26.0–34.0)
MCHC: 35.1 g/dL (ref 30.0–36.0)
MCV: 90 fL (ref 80.0–100.0)
Platelets: 79 10*3/uL — ABNORMAL LOW (ref 150–400)
RBC: 3.1 MIL/uL — ABNORMAL LOW (ref 3.87–5.11)
RDW: 15.6 % — ABNORMAL HIGH (ref 11.5–15.5)
WBC: 5.1 10*3/uL (ref 4.0–10.5)
nRBC: 0.4 % — ABNORMAL HIGH (ref 0.0–0.2)

## 2019-01-05 LAB — COMPREHENSIVE METABOLIC PANEL
ALT: 79 U/L — ABNORMAL HIGH (ref 0–44)
AST: 216 U/L — ABNORMAL HIGH (ref 15–41)
Albumin: 1.7 g/dL — ABNORMAL LOW (ref 3.5–5.0)
Alkaline Phosphatase: 172 U/L — ABNORMAL HIGH (ref 38–126)
Anion gap: 6 (ref 5–15)
BUN: 11 mg/dL (ref 8–23)
CO2: 13 mmol/L — ABNORMAL LOW (ref 22–32)
Calcium: 7.6 mg/dL — ABNORMAL LOW (ref 8.9–10.3)
Chloride: 110 mmol/L (ref 98–111)
Creatinine, Ser: 0.6 mg/dL (ref 0.44–1.00)
GFR calc Af Amer: >60 mL/min (ref >60)
GFR calc non Af Amer: >60 mL/min (ref >60)
Glucose, Bld: 118 mg/dL — ABNORMAL HIGH (ref 70–99)
Potassium: 3.3 mmol/L — ABNORMAL LOW (ref 3.5–5.1)
Sodium: 129 mmol/L — ABNORMAL LOW (ref 135–145)
Total Bilirubin: 0.6 mg/dL (ref 0.3–1.2)
Total Protein: 4.5 g/dL — ABNORMAL LOW (ref 6.5–8.1)

## 2019-01-05 LAB — GLUCOSE, CAPILLARY
Glucose-Capillary: 103 mg/dL — ABNORMAL HIGH (ref 70–99)
Glucose-Capillary: 103 mg/dL — ABNORMAL HIGH (ref 70–99)
Glucose-Capillary: 108 mg/dL — ABNORMAL HIGH (ref 70–99)
Glucose-Capillary: 98 mg/dL (ref 70–99)

## 2019-01-05 LAB — CK TOTAL AND CKMB (NOT AT ARMC)
CK, MB: 5.6 ng/mL — ABNORMAL HIGH (ref 0.5–5.0)
Relative Index: 2.4 (ref 0.0–2.5)
Total CK: 229 U/L (ref 38–234)

## 2019-01-05 MED ORDER — SODIUM CHLORIDE 0.9 % IV SOLN
1.0000 g | INTRAVENOUS | Status: DC
  Administered 2019-01-05: 14:00:00 1 g via INTRAVENOUS
  Filled 2019-01-05: qty 1
  Filled 2019-01-05: qty 10

## 2019-01-05 MED ORDER — SODIUM CHLORIDE 0.9 % IV BOLUS
500.0000 mL | Freq: Once | INTRAVENOUS | Status: AC
  Administered 2019-01-05: 20:00:00 500 mL via INTRAVENOUS

## 2019-01-05 MED ORDER — COSYNTROPIN 0.25 MG IJ SOLR
0.2500 mg | Freq: Once | INTRAMUSCULAR | Status: AC
  Administered 2019-01-06: 0.25 mg via INTRAVENOUS
  Filled 2019-01-05: qty 0.25

## 2019-01-05 MED ORDER — MIRTAZAPINE 30 MG PO TBDP
30.0000 mg | ORAL_TABLET | Freq: Every day | ORAL | Status: DC
  Administered 2019-01-05 – 2019-01-20 (×16): 30 mg via NASOGASTRIC
  Filled 2019-01-05 (×17): qty 1

## 2019-01-05 MED ORDER — NYSTATIN 100000 UNIT/GM EX POWD
Freq: Three times a day (TID) | CUTANEOUS | Status: AC
  Administered 2019-01-05 – 2019-01-12 (×18): via TOPICAL
  Filled 2019-01-05: qty 15

## 2019-01-05 MED ORDER — MORPHINE SULFATE (PF) 2 MG/ML IV SOLN
0.5000 mg | Freq: Once | INTRAVENOUS | Status: AC
  Administered 2019-01-05: 05:00:00 0.5 mg via INTRAVENOUS
  Filled 2019-01-05: qty 1

## 2019-01-05 MED ORDER — ACETAMINOPHEN 10 MG/ML IV SOLN
1000.0000 mg | Freq: Four times a day (QID) | INTRAVENOUS | Status: AC
  Administered 2019-01-05 – 2019-01-06 (×3): 1000 mg via INTRAVENOUS
  Filled 2019-01-05 (×4): qty 100

## 2019-01-05 NOTE — Progress Notes (Signed)
Patient's daughter has frequently called me and the nurses station requesting MD give her a call on her phone regarding consult to ID, stated "the medication is going into her body right now, I need to hear infectious disease say this is the right medication". Reinforced to the daughter MD were rounding and would make the consult, but she insisted I reached out to MD. Avon Gully, MD made aware of daughter's concerns. Will continue to monitor.

## 2019-01-05 NOTE — Progress Notes (Signed)
[facility] notified that patient is moaning and calling out not able to assess where pain is located. Arthor Captain LPN

## 2019-01-05 NOTE — Telephone Encounter (Signed)
RN spoke with patient's daughter, let her know that Dr Prince Rome is on call at [facility].    Patient has history of C diff with stool transplant 11/2016 and  Was recently started on   antibiotics at [facility]   Daughter is concerned and very involved in patients care. Daughter would like for ID to be aware of what is going on.  Dr. Megan Salon recently signed off with patient after no evidence of active infection.

## 2019-01-05 NOTE — Telephone Encounter (Signed)
I don't know this pt but will see if they consult [place] if she comes in.

## 2019-01-05 NOTE — Significant Event (Signed)
Rapid Response Event Note  Overview: Pt's MEWS-4. T-100.9, HR-122, LOC-responds to voice. Pt was agitated and c/o pain last night. Pt was given 0.25mg  ativan IV and tramadol 50mg  po at 2152/2157. Per RN, after receiving meds, pt went to sleep. Though sleepy, pt would still respond to voice per bedside RN. Pt has a low grade fever with a resulting increase in HR. This is not an acute change. Please call RRT if assistance needed. Dillard Essex

## 2019-01-05 NOTE — Progress Notes (Signed)
Nutrition Follow-up  RD working remotely.  DOCUMENTATION CODES:   Non-severe (moderate) malnutrition in context of chronic illness  INTERVENTION:   -ContinueOsmolite 1.2@ 52m/hr   Tube feeding regimen provides1296kcal (100% of needs),60grams of protein, and 8898mof H2O.   NUTRITION DIAGNOSIS:   Moderate Malnutrition related to chronic illness(TIA) as evidenced by mild fat depletion, moderate fat depletion, mild muscle depletion, moderate muscle depletion.  Ongoing  GOAL:   Patient will meet greater than or equal to 90% of their needs  Met with TF  MONITOR:   PO intake, Labs, Weight trends, Skin, I & O's, TF tolerance  REASON FOR ASSESSMENT:   Consult Enteral/tube feeding initiation and management  ASSESSMENT:   Sydney [name] a [age].o. 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  Patient is accompanied by her daughter who assists with the history.  Patient has history of recurrent C. difficile colitis, has continued to have loose stools, though this has improved to 4 or 5/day, has had progressively worsening appetite, eating only 3 or 4 bites and unable to even drink half of a nutritional supplement drink.  Patient denies any abdominal pain since the recent hospitalization, no fevers or chills have been noted, and she denies any dysuria though she may have complained of this a few days ago.  She was requiring assistance with ambulation and had home health PT set up at recent discharge, but has become even more weak in general since leaving the hospital and had a near syncopal episode today when she tried to get up from a bedside commode.  8/27- megace initiated, SLP recommend D3 diet with thin  liquids 8/29- nystatin added for oral thrush 9/4- pt daughter agreeable to TF 9/5- NGT placement unsuccessful x 2 (RN bedside placement0 9/7- cortrak tube placed; tip of tube confirmed in stomach  Reviewed I/O's: 0 ml x 24 hours and +13 L since 12/22/18  TF no advanced to goal rate and tolerating well. Per GI notes, plan for ACT stim and HIV tests. Pt remains with liquid stool. Plan for palliative care to re-engage to discuss goals of care.   Labs reviewed: Na: 129, K: 3.3, CBGS: 98-108.   Diet Order:   Diet Order            DIET DYS 3 Room service appropriate? Yes; Fluid consistency: Thin  Diet effective now              EDUCATION NEEDS:   Not appropriate for education at this time  Skin:  Skin Assessment: Skin Integrity Issues: Skin Integrity Issues:: Stage II Stage II: sacrum, coccyx  Last BM:  01/04/19  Height:   Ht Readings from Last 1 Encounters:  12/20/18 '5\' 3"'  (1.6 m)    Weight:   Wt Readings from Last 1 Encounters:  01/04/19 61 kg    Ideal Body Weight:  52.3 kg  BMI:  Body mass index is 23.82 kg/m.  Estimated Nutritional Needs:   Kcal:  1250-1450  Protein:  60-75 grams  Fluid:  > 1.2 L    Ari Engelbrecht A. WiJimmye NormanRD, LDN, CDHardyegistered Dietitian II Certified Diabetes Care and Education Specialist Pager: [phone] hours Pager: [phone]

## 2019-01-05 NOTE — Progress Notes (Signed)
Pt daughter at bedside requesting to speak with MD and GI. Daughter concerned about vaginal area and mother being in pain; Requested that OB-GYN get involved. Explained to daughter that pt rest comfortably unless she is touched; She becomes agitated when she is moved, daughter stated "that's because she is in pain and we need to figure out why".  She is also concerned pt has UTI; Does not want abx therapy unless it is confirmed pt has UTI; Explained I&O cath would further irritate the pt; States she wanted me to "scoop the urine like they did the stool"; Made daughter aware that was not possible; Daughter verbalized understanding. Daughter stated "if you could just contact the doctor and GI doctor for me that would be greatly appreciated, thank you." Avon Gully, MD and [place], [place] made aware of daughter's concerns; Will continue to monitor.

## 2019-01-05 NOTE — Care Management Important Message (Signed)
Important Message  Patient Details  Name: Sydney [name] MRN: [id number] Date of Birth: Nov 05, 1930   Medicare Important Message Given:  Yes     [name] 01/05/2019, 3:31 PM

## 2019-01-05 NOTE — Progress Notes (Signed)
          Daily Rounding Note  01/05/2019, 8:16 AM  LOS: 16 days   SUBJECTIVE:   Chief complaint:   Elevated LFTs.  FTT  TF running at goal of 45/ml/hour.  No stools overnight.   HR 122 and temp to 100.9, agitation (maoning) ~ 4 AM this AM.  [place] to sleep after Ativan IV and PO Tramadol.       OBJECTIVE:         Vital signs in last 24 hours:    Temp:  [99 F (37.2 C)-100.9 F (38.3 C)] 99 F (37.2 C) (09/10 0338) Pulse Rate:  [73-122] 94 (09/10 0338) Resp:  [16-24] 24 (09/10 0338) BP: (92-131)/(51-84) 92/51 (09/10 0338) SpO2:  [97 %-99 %] 97 % (09/10 0338) Last BM Date: 01/04/19 Filed Weights   12/26/18 0500 01/03/19 0500 01/04/19 0500  Weight: 61 kg 61 kg 61 kg   General: cachectic, quiet until exam or staff interaction then starts moaning.    Heart: RRR Chest: rattling at back of throat.  No labored breathing Abdomen: soft, BS present, ND, NT (actually did not moan with abdominal palpation!)  Extremities: thin, muscle wasting Neuro/Psych:  Unable to assess orientation.  Not following the few commands requested.   Moves arms and legs.     Intake/Output from previous day: No intake/output data recorded.  Intake/Output this shift: Total I/O In: 3641.3 [I.V.:2640.1; NG/GT:1001.3] Out: -   Lab Results: Recent Labs    01/04/19 0134  WBC 3.0*  HGB 11.0*  HCT 34.5*  PLT 86*   BMET Recent Labs    01/04/19 0134 01/05/19 0153  NA 130* 129*  K 3.3* 3.3*  CL 112* 110  CO2 13* 13*  GLUCOSE 112* 118*  BUN 8 11  CREATININE 0.75 0.60  CALCIUM 8.1* 7.6*   LFT Recent Labs    01/04/19 0134 01/05/19 0153  PROT 5.3* 4.5*  ALBUMIN 1.9* 1.7*  AST 247* 216*  ALT 92* 79*  ALKPHOS 165* 172*  BILITOT 0.8 0.6     ASSESMENT:   *   Elevated LFTs,  AST >> ALT, alk phos.  No clear etiology on AIH labs, viral labs, ultrasound, CT.   MRCP aborted x 2 due to pt agitation.   Alk phos rising, transaminases improving.     *   Fever to 100.9, HR 122 early this AM.    *  Weakness, FTT, anorexia, refusing PO, malnutrition Tolerating coretrack TF.     *    Behavioral changes.  Continues moaning and (in pain?)  *   Pancytopenia.      *   Hypokalemia, hyponatremia, hypomagnesemia.    *    Liquid stools.  c diff, path panel negative.  ? Enteritis vs ileus on CT, KUB.   Marland Kitchen     PLAN   *    Dr Havery Moros requests ACT stim, HIV tests.   Ordered.          Azucena Freed  01/05/2019, 8:16 AM Phone [phone]

## 2019-01-05 NOTE — Progress Notes (Addendum)
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 CT of the abdomen and pelvis was negative for acute intra-abdominal or pelvic abnormality. She was found to have mildly elevated LFTs along with hyponatremia and pancytopenia. She was given IV fluids. ID and GI were consulted.  Palliative care was subsequently consulted for goals of care discussion.  Assessment & Plan:   Principal Problem:   Adult failure to thrive Active Problems:   Pancytopenia (HCC)   Hyponatremia   Protein calorie malnutrition (HCC)   History of TIA (transient ischemic attack)   Generalized weakness   Pressure injury of skin   Transaminitis   Malnutrition of moderate degree   Evaluation by psychiatric service required   Failure to thrive in an adult, severe protein caloric malnutrition, hypoalbuminemia. POA, worsening - Patient now representing to the hospital with weakness, and failure to thrive with decreased oral intake.   - CT head negative for acute cranial abnormality.   - MR brain no evidence of acute stroke.   - Unclear etiology, but there was thought to be a component of depression and psychiatry was consulted with recommendations of initiation of antidepressant therapy, but patient nor daughter interested at this time. - PT/OT recommends SNF, patient/daughter declined - Continue to encourage increased oral intake; patient with very little oral intake during hospitalization - Continue IV fluids/coretrak tube feeds - Continue Megace for appetite stimulation, patient currently refusing - Attempted NG tube placement 9/5 unsuccessful with  multiple attempts - Coretrak placement 01/03/2019; starting tube feeds - Overall prognosis has been discussed by multiple providers which is guarded to poor. Palliative care consultation evaluation appreciated.  Despite aggressive measures, patient's condition continues to further decline as she is refusing most interventions and treatment at this time.  - Despite multiple conversations with her daughter, who continues to request aggressive measures as she states her mother's only wishes are "to live"; while the patient continues refusing all interventions and treatment.   -We have high suspicion that her mother suffers from severe depression with recent loss of her sister 2 months ago, although her daughter continues to think that this theory is completely wrong  Acute metabolic encephalopathy, likely secondary to above  -Patient today unable to assess orientation, previously was at least alert to person/situation per previous documentation -At this time unclear etiology other than poor p.o. intake and failure to thrive. -Patient did receive a small dose lorazepam previously, this is been discontinued, patient has been refusing all medications for the past 48 hours, unlikely polypharmacy -Consider seroquel via NG in the next 24-48h if patient continues to be altered - possible sundowning/delirium   ?UTI given encephalopathy and low grade fever -Unable to assess for symptoms given patient's mental status -but patient does continue to reach for groin and moan in what appears to be pain -Patient continues to be somewhat difficult/combative, will treat prophylactically as getting a clean urine from the patient is likely going to be acutely difficult -Ceftriaxone 1 g x 3 days -Patient daughter requesting ID consult to follow along for simple UTI treatment.  Will sideline ID on the floor, no reason for formal consult at this point.  ? Episode of diarrhea,   CDiff ruled out - Patient started having  increased frequency of diarrhea on 01/02/2019 with white count level less than 4.0 with a temperature 100.1 - Previous history of C. difficile colitis back in 2018 in which she received a fecal transplant.   - Daughter concerned that this is a recurrence of her C. difficile.   - Only recent antibiotic exposure was with Keflex during previous admission for UTI. - Enteric precautions  Odynophagia with possible oropharyngeal candidiasis - Unable to use Diflucan because of increased LFTs.  Nystatin orally has been ordered but patient refuses all her meds.  GI following, appreciate input.  Elevated LFTs - Patient presenting with notable elevated transaminitis.   - Hepatitis panel negative. EBV and CMV IgM are negative.   - Right upper quadrant ultrasound only showed gallbladder sludging.   - CT abdomen/pelvis with no evidence of acute intra-abdominal or pelvic abnormality but with diffuse fluid within the colon suggestive of possible enteritis versus diarrheal illness. - KUB on 12/29/2018 with mild diffuse gaseous prominence without obstructive pattern.  Suspicion for possible mild shock liver given she was hypotensive on presentation.   - GI also believes that this transaminitis could reflect from skeletal muscle release as well. - Gastroenterology now signed off as of 01/01/2019 - LFTs generally trending down with improved nutrition and free water flushes  Pancytopenia, improving - Evaluated by hematology.  - Patient has been started on vitamin B12 parenterally as she had low levels of vitamin B12 during last admission but vitamin B12 level normal this admission.   - Minimally improving at this point with nutrition, unlikely affected by B12 given timing. - Outpatient follow-up with hematology.  No plans for bone marrow biopsy during this admission.  Hyponatremia, likely hypovolemic - Sodium 128 at admission likely in the setting of poor p.o. intake and dehydration - Continues to improve with IV  fluid, free water flushes via NG tube and tube feeding  Hypokalemia, Hypomagnesemia in the setting of poor p.o. intake -Improving, follow daily labs  History of TIA  - Patient has not received Plavix since 12/24/2018 as she has refused.  Stopped Plavix for now for possible need for EGD/liver biopsy or any sort of invasive procedure. - Continue aspirin; although patient refusing all medications at this time  Stage II sacral pressure injury: Present on admission - Continue wound care as per wound care consult recommendations.  Generalized deconditioning - PT recommends SNF placement.  - Daughter thinks that patient will get better and will be able to go home and declines SNF placement.  Ethics: Patient now representing after recent hospitalization, was home for roughly 1-2 days and returned with worsening weakness, severe dehydration, and adult failure to thrive.  Daughter believes that she just will require some nutrition through artificial means to get her back to her previous baseline. She states that her mother was independent with her ADLs and cooking for herself -unclear timeframe.  Daughter states that her current decline only started on 12/28/2018; which is counter to all nursing reports and previous provider reports during her care during this hospitalization.  Daughter seems to be struggling with the notion that her mother is in the active dying process in regards to her adult failure to thrive in which she is refusing most if not all interventions and has not been eating or drinking now for approximately 11 days.  At this point, daughter agrees to proceed with cortrak tube placement to increase her nutrition, which was completed today.   - Given   this patient's lack response and repeat hospitalization in less than a few days from previous discharge, patient's prognosis is extremely grim, and this has been relayed to the patient's daughter on multiple occasions by multiple providers.  -Ethics has been consulted, following along, we appreciate insight recommendations on this difficult case   DVT prophylaxis: SCDs Code Status: Full code Family Communication: Discussed plan of care with patient's daughter extensively over the phone Disposition Plan: Continue inpatient, tube feeds; poor/grim prognosis   Consultants:   Gastroenterology, [place]  Infectious disease  Hematology  Palliative care  Procedures: None  Antimicrobials: None  Subjective: Patient somewhat combative overnight requiring Ativan x1, extremely noncompliant with physical exam this morning, unable to properly assess the patient for evaluate patient's inner thigh or groin area for reported rash due to patient's combativeness.  Objective: Vitals:   01/04/19 0537 01/04/19 1428 01/04/19 2011 01/05/19 0338  BP: 120/62 131/84 119/65 (!) 92/51  Pulse: 99 73 (!) 122 94  Resp: 20 16  (!) 24  Temp: 99.8 F (37.7 C) 99.5 F (37.5 C) (!) 100.9 F (38.3 C) 99 F (37.2 C)  TempSrc: Axillary Axillary Oral Oral  SpO2: 100% 98% 99% 97%  Weight:      Height:        Intake/Output Summary (Last 24 hours) at 01/05/2019 0821 Last data filed at 01/05/2019 0739 Gross per 24 hour  Intake 3641.33 ml  Output -  Net 3641.33 ml   Filed Weights   12/26/18 0500 01/03/19 0500 01/04/19 0500  Weight: 61 kg 61 kg 61 kg    Examination:  General: Cachectic elderly appearing female, resting comfortably, when woken patient become somewhat combative, stating only "no"  HEENT:  Normocephalic atraumatic.  Sclerae nonicteric, noninjected.  Extraocular movements intact bilaterally.  Cortrak NG tube in place Neck:  Without mass or deformity.  Trachea is midline. Lungs:  Clear to auscultate bilaterally without rhonchi, wheeze, or rales. Heart:  Regular rate and rhythm.  Without murmurs, rubs, or gallops. Abdomen:  Soft, nontender, nondistended.  Without guarding or rebound. Extremities: Without cyanosis, clubbing, edema,  or obvious deformity. Vascular:  Dorsalis pedis and posterior tibial pulses palpable bilaterally. Skin:  Warm and dry, no erythema, no ulcerations.  Data Reviewed: I have personally reviewed following labs and imaging studies  CBC: Recent Labs  Lab 12/30/18 0338 12/31/18 0532 01/01/19 0359 01/04/19 0134 01/05/19 0720  WBC 2.2* 2.6* 2.8* 3.0* 5.1  HGB 10.4* 12.2 11.1* 11.0* 9.8*  HCT 32.6* 38.6 33.2* 34.5* 27.9*  MCV 96.4 98.7 93.8 97.7 90.0  PLT 90* 82* 88* 86* 79*   Basic Metabolic Panel: Recent Labs  Lab 12/30/18 0338 12/31/18 0532 01/01/19 0359 01/04/19 0134 01/05/19 0153  NA 141 141 141 130* 129*  K 5.0 5.0 4.7 3.3* 3.3*  CL 124* 122* 123* 112* 110  CO2 15* 14* 14* 13* 13*  GLUCOSE 103* 108* 89 112* 118*  BUN 8 7* 7* 8 11  CREATININE 0.75 0.78 0.70 0.75 0.60  CALCIUM 7.9* 8.5* 8.4* 8.1* 7.6*  MG  --   --   --  1.5*  --   PHOS  --   --   --  2.5  --    GFR: Estimated Creatinine Clearance: 41.8 mL/min (by C-G formula based on SCr of 0.6 mg/dL). Liver Function Tests: Recent Labs  Lab 12/30/18 0338 12/31/18 0532 01/01/19 0359 01/04/19 0134 01/05/19 0153  AST 297* 285* 237* 247* 216*  ALT 114* 111* 94* 92* 79*  ALKPHOS 126 162*   150* 165* 172*  BILITOT 0.6 0.9 0.6 0.8 0.6  PROT 5.0* 5.6* 5.3* 5.3* 4.5*  ALBUMIN 2.0* 2.2* 2.0* 1.9* 1.7*   Cardiac Enzymes: Recent Labs  Lab 12/30/18 0338 01/05/19 0153  CKTOTAL 254* 229  CKMB  --  5.6*   CBG: Recent Labs  Lab 01/04/19 1257 01/04/19 1649 01/04/19 2007 01/05/19 0026 01/05/19 0335  GLUCAP 126* 108* 116* 108* 98    Recent Results (from the past 240 hour(s))  C difficile quick scan w PCR reflex     Status: None   Collection Time: 01/03/19  5:36 PM   Specimen: Stool  Result Value Ref Range Status   C Diff antigen NEGATIVE NEGATIVE Final   C Diff toxin NEGATIVE NEGATIVE Final   C Diff interpretation No C. difficile detected.  Final    Comment: Performed at [facility], [place] [address]  [place], [place] [address]  Gastrointestinal Panel by PCR , Stool     Status: None   Collection Time: 01/03/19  5:36 PM   Specimen: Stool  Result Value Ref Range Status   Campylobacter species NOT DETECTED NOT DETECTED Final   Plesimonas shigelloides NOT DETECTED NOT DETECTED Final   Salmonella species NOT DETECTED NOT DETECTED Final   Yersinia enterocolitica NOT DETECTED NOT DETECTED Final   Vibrio species NOT DETECTED NOT DETECTED Final   Vibrio cholerae NOT DETECTED NOT DETECTED Final   Enteroaggregative E coli (EAEC) NOT DETECTED NOT DETECTED Final   Enteropathogenic E coli (EPEC) NOT DETECTED NOT DETECTED Final   Enterotoxigenic E coli (ETEC) NOT DETECTED NOT DETECTED Final   Shiga like toxin producing E coli (STEC) NOT DETECTED NOT DETECTED Final   Shigella/Enteroinvasive E coli (EIEC) NOT DETECTED NOT DETECTED Final   Cryptosporidium NOT DETECTED NOT DETECTED Final   Cyclospora cayetanensis NOT DETECTED NOT DETECTED Final   Entamoeba histolytica NOT DETECTED NOT DETECTED Final   Giardia lamblia NOT DETECTED NOT DETECTED Final   Adenovirus F40/41 NOT DETECTED NOT DETECTED Final   Astrovirus NOT DETECTED NOT DETECTED Final   Norovirus GI/GII NOT DETECTED NOT DETECTED Final   Rotavirus A NOT DETECTED NOT DETECTED Final   Sapovirus (I, II, IV, and V) NOT DETECTED NOT DETECTED Final    Comment: Performed at [facility], [address] [place], [place] [address]     Radiology Studies: No results found.  Scheduled Meds: . aspirin EC  81 mg Oral Daily  . folic acid  1 mg Oral Daily  . Gerhardt's butt cream   Topical TID  . latanoprost  1 drop Both Eyes QHS  . megestrol  400 mg Per Tube Daily  . multivitamin with minerals  1 tablet Per Tube Daily  . nystatin  5 mL Oral QID  . pantoprazole  20 mg Oral Daily  . sodium chloride flush  3 mL Intravenous Q12H   Continuous Infusions: . sodium chloride    . dextrose 5 % and 0.45% NaCl 100 mL/hr at 01/05/19 0032  . feeding  supplement (OSMOLITE 1.2 CAL) 1,000 mL (01/04/19 1609)     LOS: 16 days    Time spent: >30; more than 20 minutes was spent at bedside/over the phone discussing case with family (daughter).  Lengthy discussion about patient's poor prognosis, multiple morbidities, ongoing poor mental status changes with what appears to be worsening mental status despite improvement in nutrition and free water.  Little Ishikawa, DO [facility] Pager [phone]  If 7PM-7AM, please contact night-coverage [email or web address] Password  [email or web address] 01/05/2019, 8:21 AM   

## 2019-01-05 NOTE — Progress Notes (Addendum)
Spoke with the patient's daughter at the bedside, at length. Explained to patient Dr. Avon Gully has been contacted regarding consulting infectious disease. She states the patient has had issues in the past with antibiotics so she wants to be sure we are treating her with the right antibiotic that is safe for her mother. Patient's daughter also questioned me about the pain medication or ativan that was given to patient. I explained to her Kadiatu had received Morphine, Ativan, and Ultram 9/9-9/10. She states she is unhappy her mother has received this medication and it is too much. She requested the ordering physician's name of these medications and the patient's BP at certain times. She is requesting to speak with the person/doctor over the hospitalist.   Patient's daughter states she believes her daughter is in pain when she is moaning and we need to figure out what it is that is causing her pain and fix it. I explained to her that is likely the reason she received the pain medication. The daughter states she does not want her mother to receive any pain medication or medication that would cause sedation. I have relayed this to the night shift nurses.   Dalene Seltzer, DD made aware of the situation.  Ron, [facility] also made aware.  Lexine Baton, NP with palliative also aware of the situation.

## 2019-01-06 ENCOUNTER — Inpatient Hospital Stay (HOSPITAL_COMMUNITY): Payer: Medicare Other

## 2019-01-06 DIAGNOSIS — R627 Adult failure to thrive: Secondary | ICD-10-CM | POA: Diagnosis not present

## 2019-01-06 DIAGNOSIS — R945 Abnormal results of liver function studies: Secondary | ICD-10-CM | POA: Diagnosis not present

## 2019-01-06 LAB — CBC
HCT: 27.4 % — ABNORMAL LOW (ref 36.0–46.0)
Hemoglobin: 9.6 g/dL — ABNORMAL LOW (ref 12.0–15.0)
MCH: 31.4 pg (ref 26.0–34.0)
MCHC: 35 g/dL (ref 30.0–36.0)
MCV: 89.5 fL (ref 80.0–100.0)
Platelets: 75 10*3/uL — ABNORMAL LOW (ref 150–400)
RBC: 3.06 MIL/uL — ABNORMAL LOW (ref 3.87–5.11)
RDW: 15.8 % — ABNORMAL HIGH (ref 11.5–15.5)
WBC: 8.7 10*3/uL (ref 4.0–10.5)
nRBC: 0 % (ref 0.0–0.2)

## 2019-01-06 LAB — COMPREHENSIVE METABOLIC PANEL
ALT: 65 U/L — ABNORMAL HIGH (ref 0–44)
AST: 184 U/L — ABNORMAL HIGH (ref 15–41)
Albumin: 1.4 g/dL — ABNORMAL LOW (ref 3.5–5.0)
Alkaline Phosphatase: 168 U/L — ABNORMAL HIGH (ref 38–126)
Anion gap: 3 — ABNORMAL LOW (ref 5–15)
BUN: 17 mg/dL (ref 8–23)
CO2: 15 mmol/L — ABNORMAL LOW (ref 22–32)
Calcium: 7.3 mg/dL — ABNORMAL LOW (ref 8.9–10.3)
Chloride: 112 mmol/L — ABNORMAL HIGH (ref 98–111)
Creatinine, Ser: 0.76 mg/dL (ref 0.44–1.00)
GFR calc Af Amer: >60 mL/min (ref >60)
GFR calc non Af Amer: >60 mL/min (ref >60)
Glucose, Bld: 120 mg/dL — ABNORMAL HIGH (ref 70–99)
Potassium: 3.6 mmol/L (ref 3.5–5.1)
Sodium: 130 mmol/L — ABNORMAL LOW (ref 135–145)
Total Bilirubin: 0.6 mg/dL (ref 0.3–1.2)
Total Protein: 3.8 g/dL — ABNORMAL LOW (ref 6.5–8.1)

## 2019-01-06 LAB — ACTH STIMULATION, 3 TIME POINTS: Cortisol, Base: 22.1 ug/dL

## 2019-01-06 LAB — CK TOTAL AND CKMB (NOT AT ARMC)
CK, MB: 6.8 ng/mL — ABNORMAL HIGH (ref 0.5–5.0)
Relative Index: 1.8 (ref 0.0–2.5)
Total CK: 370 U/L — ABNORMAL HIGH (ref 38–234)

## 2019-01-06 LAB — GLUCOSE, CAPILLARY: Glucose-Capillary: 100 mg/dL — ABNORMAL HIGH (ref 70–99)

## 2019-01-06 LAB — HIV ANTIBODY (ROUTINE TESTING W REFLEX): HIV Screen 4th Generation wRfx: NONREACTIVE

## 2019-01-06 MED ORDER — FOLIC ACID 1 MG PO TABS
1.0000 mg | ORAL_TABLET | Freq: Every day | ORAL | Status: DC
  Administered 2019-01-07 – 2019-01-19 (×12): 1 mg
  Filled 2019-01-06 (×13): qty 1

## 2019-01-06 MED ORDER — CEFAZOLIN SODIUM-DEXTROSE 1-4 GM/50ML-% IV SOLN
1.0000 g | Freq: Two times a day (BID) | INTRAVENOUS | Status: DC
  Administered 2019-01-06: 13:00:00 1 g via INTRAVENOUS
  Filled 2019-01-06 (×2): qty 50

## 2019-01-06 MED ORDER — ASPIRIN 81 MG PO CHEW
81.0000 mg | CHEWABLE_TABLET | Freq: Every day | ORAL | Status: DC
  Administered 2019-01-06 – 2019-01-19 (×13): 81 mg
  Filled 2019-01-06 (×14): qty 1

## 2019-01-06 MED ORDER — TRAMADOL 5 MG/ML ORAL SUSPENSION: 50.0000 mg | Freq: Once | ORAL | Status: DC

## 2019-01-06 MED ORDER — ADULT MULTIVITAMIN LIQUID CH
15.0000 mL | Freq: Every day | ORAL | Status: DC
  Administered 2019-01-06 – 2019-01-11 (×6): 15 mL via ORAL
  Filled 2019-01-06 (×6): qty 15

## 2019-01-06 MED ORDER — TRAMADOL HCL 50 MG PO TABS
50.0000 mg | ORAL_TABLET | Freq: Once | ORAL | Status: AC
  Administered 2019-01-06: 18:00:00 50 mg via ORAL
  Filled 2019-01-06: qty 1

## 2019-01-06 MED ORDER — CEPHALEXIN 250 MG/5ML PO SUSR
500.0000 mg | Freq: Two times a day (BID) | ORAL | Status: AC
  Administered 2019-01-06 – 2019-01-08 (×5): 500 mg
  Filled 2019-01-06 (×5): qty 10

## 2019-01-06 MED ORDER — ACETAMINOPHEN 500 MG PO TABS
1000.0000 mg | ORAL_TABLET | Freq: Four times a day (QID) | ORAL | Status: DC
  Administered 2019-01-06 – 2019-01-10 (×15): 1000 mg
  Filled 2019-01-06 (×16): qty 2

## 2019-01-06 MED ORDER — PANTOPRAZOLE SODIUM 40 MG PO TBEC: 40.0000 mg | DELAYED_RELEASE_TABLET | Freq: Every day | ORAL | Status: DC

## 2019-01-06 MED ORDER — IOHEXOL 300 MG/ML  SOLN
100.0000 mL | Freq: Once | INTRAMUSCULAR | Status: AC | PRN
  Administered 2019-01-06: 100 mL via INTRAVENOUS

## 2019-01-06 MED ORDER — IBUPROFEN 600 MG PO TABS
600.0000 mg | ORAL_TABLET | Freq: Once | ORAL | Status: AC
  Administered 2019-01-06: 13:00:00 600 mg
  Filled 2019-01-06: qty 1

## 2019-01-06 MED ORDER — PANTOPRAZOLE SODIUM 40 MG PO PACK
40.0000 mg | PACK | Freq: Every day | ORAL | Status: DC
  Administered 2019-01-06 – 2019-01-19 (×11): 40 mg
  Filled 2019-01-06 (×17): qty 20

## 2019-01-06 MED ORDER — VANCOMYCIN 50 MG/ML ORAL SOLUTION
125.0000 mg | Freq: Two times a day (BID) | ORAL | Status: AC
  Administered 2019-01-06 – 2019-01-08 (×5): 125 mg
  Filled 2019-01-06 (×5): qty 2.5

## 2019-01-06 NOTE — Progress Notes (Signed)
Patient to Imaging for CT of abdomen/pelvis.

## 2019-01-06 NOTE — Progress Notes (Signed)
          Daily Rounding Note  01/06/2019, 8:26 AM  LOS: 17 days   SUBJECTIVE:  Chief complaint:   Elevated LFTs Moaned all night long.  No BMs overnight.  No stools recorded yesterday, 1 this morning.  Tolerating tube feeds at goal rate. No current fevers or tachycardia.  Blood pressure continues soft.   OBJECTIVE:         Vital signs in last 24 hours:    Temp:  [97.8 F (36.6 C)-102.9 F (39.4 C)] 97.8 F (36.6 C) (09/11 0426) Pulse Rate:  [81-105] 81 (09/11 0426) Resp:  [20-24] 20 (09/11 0426) BP: (88-123)/(46-63) 110/55 (09/11 0426) SpO2:  [92 %-99 %] 99 % (09/11 0426) Last BM Date: 01/04/19 Filed Weights   12/26/18 0500 01/03/19 0500 01/04/19 0500  Weight: 61 kg 61 kg 61 kg   General: Looks the same.  Resting quietly with intermittent moaning during exam. Heart: RRR. Chest: No labored breathing.  Rattling at the back of the throat with weak cough. Abdomen: Hypoactive bowel sounds.  Not tender or distended. Extremities: Slight, nonpitting edema developing at the tops of her feet.  Limbs with global muscle wasting.   Neuro/Psych: Did not awaken patient for neurologic exam.  Intake/Output from previous day: 09/10 0701 - 09/11 0700 In: 4095.3 [I.V.:2994.1; NG/GT:1001.3; IV Piggyback:100] Out: -   Intake/Output this shift: No intake/output data recorded.  Lab Results: Recent Labs    01/04/19 0134 01/05/19 0720 01/06/19 0306  WBC 3.0* 5.1 8.7  HGB 11.0* 9.8* 9.6*  HCT 34.5* 27.9* 27.4*  PLT 86* 79* 75*   BMET Recent Labs    01/04/19 0134 01/05/19 0153 01/06/19 0306  NA 130* 129* 130*  K 3.3* 3.3* 3.6  CL 112* 110 112*  CO2 13* 13* 15*  GLUCOSE 112* 118* 120*  BUN '8 11 17  '$ CREATININE 0.75 0.60 0.76  CALCIUM 8.1* 7.6* 7.3*   LFT Recent Labs    01/04/19 0134 01/05/19 0153 01/06/19 0306  PROT 5.3* 4.5* 3.8*  ALBUMIN 1.9* 1.7* 1.4*  AST 247* 216* 184*  ALT 92* 79* 65*  ALKPHOS 165* 172* 168*   BILITOT 0.8 0.6 0.6     ASSESMENT:   *   Elevated LFTs,  AST >> ALT, alk phos.  No clear etiology on AIH labs, viral labs, ultrasound, CT.   MRCP aborted x 2 due to pt agitation.   Alk phos rising, transaminases improving.    *  Weakness, FTT, anorexia, refusing PO, malnutrition Tolerating coretrack TF.     *    Behavioral changes.  Continues moaning and (in pain?) Cosyntropin ACTH stim test began this morning.  *   Pancytopenia.  WBCs normal for the last 2 days.  Slight worsening of thrombocytopenia and anemia.     *   Hypokalemia, resolved.  Hyponatremia improved.   Renal function WNL.  *    Elevated CK and CK-MB.   PLAN    *   Per Dr. Havery Moros.    Azucena Freed  01/06/2019, 8:26 AM Phone [phone]

## 2019-01-06 NOTE — Progress Notes (Signed)
Responded to page from patients nurse to come and provide support to daughter who is having a difficult time excepting mother medical condition. Sydney [name] says that she doesn't feel that she is clear on what is wrong with her mother and therefore can not make the best decisions for her.  I suggested that she have a meeting with her mother medical team and get clarity as well as listen to medical team advice.  Don't think daughter is intentional not being cooperate rather she is an only child and trying desperately to hold on and do what she thinks best for her mother. She indicated that she was willing to let her mother go if she has a clear understanding of what is wrong with her. Provided ministry of presence, emotional and spiritual support.  Will follow as needed.  Jaclynn Major, [place], [facility], Pager [phone]

## 2019-01-06 NOTE — Progress Notes (Signed)
Palliative Care Follow-up Note  Patient and her daughter Sydney [name] are well known to palliative care and I have had multiple conversations with Sydney [name] throughout her mothers hospital stay. On my last meeting with her I talked at length with her along with her trusted pastor about comfort care and about artificial feeding.Sydney [name] has not been able to accept the serious irreversible condition of her mother and is searching for any and everything that could reflect some glimmer of hope that her mother is not dying or that she can get better. I explained in detail the overall big picture in an attempt to shift her focus from single areas of grasping like her mother receiving "unapproved antibiotics" or "improvement in her LFTs". She was on my last visit upset that her mothers "eye drops" had been discontinued and wanted to know what doctor was responsible for that. I provided calm reassurance those could easily be reordered. Sydney [name] also struggles with understanding and providing medical facts and has not reported a consistent timeline- much of this I feel is that she wants to give a history that keeps providers engaged and one that supports hope that her mother can make a full recovery. I discussed what "hope" means and asked her to consider dignity and comfort as a hope as her mother approaches the end of her life. Sydney [name] is terrified of losing her mother-it has been just the two of them for a very long time and this will be extremely difficult. The other issue that Sydney [name] is struggling with is that there is no hard and fast working medical diagnosis that has a clear trajectory and prognosis- this leaves her still searching. I continue to explain that it is a number of conditions that have landed her mother in this condition-depression, malnutrition, recent fall, pancytopenia, recurrent UTI and chronic enteritis.  Sydney [name] is not at the bedside this AM on my visit, her mother is moaning and appears to be in significant  pain. I have reviewed her lab findings, Her renal function is normal and in general her LFTs have trended down from admission- given this I believe scheduled Tylenol for pain would be safe at minimum. We could also consider administration of fluconazole for her thrush.  I will continue to discuss goals of care- on my last visit it was clear Sydney [name] was not ready to transition to comfort care, but I gave her my information and offered to help if and when she was ready. I have significant concerns that her mother is a FULL CODE. I will address this again. I also requested that she bring in her mothers living will documents.  Recommendations:  1. I will contact Sydney [name] today to further discuss goals. 2. Scheduled Tylenol-I think much of her pain is from immobility and degenerative disease. 3. Consider Fluconazole for thrush 4. Consider Mirtazapine 15mg -30mg  via Cortrak-She had refused this on my initial visit. 5. Avoid Benzodiazapines-treat pain as source of agitation. 6. Morphine low dose as second line pain control. 7. Daughter has been explicit that ID should be involved in any and all antibiotic selection for the patient due to her previous stool transplant for  Resistant C Rondo, DO Palliative Medicine [phone]  Time: 35 min Greater than 50%  of this time was spent counseling and coordinating care related to the above assessment and plan.

## 2019-01-06 NOTE — Progress Notes (Signed)
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 CT of the abdomen and pelvis was negative for acute intra-abdominal or pelvic abnormality. She was found to have mildly elevated LFTs along with hyponatremia and pancytopenia. She was given IV fluids. ID and GI were consulted.  Palliative care was subsequently consulted for goals of care discussion.  Patient has remained inpatient for greater than 2 weeks with worsening status daily.  Patient's mental status is now deteriorated to moaning, unable to interact in any meaningful way with the patient at this point.  Patient does have ongoing NG tube with medications and food per daughter's wishes.  Patient does remain full code despite multiple discussions of patient's worsening condition, palliative care has been consulted now 3 times with multiple lengthy sessions with the daughter about patient's worsening condition.  Patient has had extensive work-up as below including imaging, labs all of which essentially have been unremarkable.  Patient currently being treated for questionable UTI given febrile events and mental status changes in hopes that this will improve her status however patient's normal LFTs, poor p.o. intake and likely current depression are ongoing.  Patient is an advanced age, appears severely malnourished at this point, continues to refuse medications and management although daughter continues to request ongoing aggressive treatment, multiple consults as well as ongoing evaluation despite extensive negative work-up as below.  Per discussion with palliative care as well as GI  it appears the patient's daughter is unfortunately unable to cope with the fact that her mother is actively dying.  We continue to follow along closely, offer what support we can, ethics has been consulted given inappropriate requests by daughter for consults and treatment which border on futile.  Additionally patient continues to be somewhat agitated, unclear if patient is in pain or simply agitated due to her worsening mental status, but daughter is requesting we hold off on any further pain medication or anxiolytics.  We would recommend to focus on comfort measures and comfort care at this time, but daughter continues to request more aggressive care daily.  I am not sure any further imaging, lab work or seizures at this time will change our trajectory.  We unfortunately do not have a working diagnosis for patient's rapid decline other than markedly poor p.o. intake in the setting of likely depression other possible UTI given one-time fever and patient's mental status changes.  Assessment & Plan:   Principal Problem:   Adult failure to thrive Active Problems:   Pancytopenia (HCC)   Hyponatremia   Protein calorie malnutrition (HCC)   History of TIA (transient ischemic attack)   Generalized weakness   Pressure injury of skin   Transaminitis   Malnutrition of moderate degree   Evaluation by psychiatric service required    Failure to thrive in an adult, severe protein caloric malnutrition, hypoalbuminemia. POA, worsening - Patient now representing to the hospital with weakness, and failure to thrive with decreased oral intake.   - CT head negative for acute cranial abnormality.   - MR brain no evidence of acute stroke.   - Unclear etiology, but there was thought to be a component of depression and psychiatry was consulted with recommendations  of initiation of antidepressant therapy, but patient nor daughter interested at this time. - PT/OT recommends SNF, patient/daughter declined - Continue  to encourage increased oral intake; patient with very little oral intake during hospitalization - Continue IV fluids/coretrak tube feeds - Continue Megace for appetite stimulation, patient currently refusing - Attempted NG tube placement 9/5 unsuccessful with multiple attempts - Coretrak placement 01/03/2019; starting tube feeds - Overall prognosis has been discussed by multiple providers which is guarded to poor. Palliative care consultation evaluation appreciated.  - Despite aggressive measures, patient's condition continues to further decline as she is refusing most interventions and treatment at this time.  - Despite multiple conversations with her daughter, who continues to request aggressive measures as she states her mother's only wishes are "to live"; while the patient continues refusing all interventions and treatment.   - We have high suspicion that her mother suffers from severe depression with recent loss of her sister 2 months ago, although her daughter continues to think that this Link Snuffer is completely wrong  Acute metabolic encephalopathy, likely secondary to above  - Patient remains altered, without any meaningful interaction, moaning when examined - At this time unclear etiology other than poor p.o. intake and failure to thrive. - Patient did receive a small dose lorazepam earlier in the week, this is been discontinued, likely polypharmacy at this point  ?UTI given encephalopathy and previous fever Yeast infection, groin/oral thrush - Unable to assess for symptoms given patient's mental status -but patient does continue to reach for groin and moan in what appears to be pain - Patient continues to be somewhat difficult/combative, will treat prophylactically as getting a clean urine from the patient is likely going to be acutely difficult - Treat UTI x3 days - Nystatin topical powder to groin, nystatin oral for p.o. thrush  ? Episode of diarrhea, CDiff ruled out - Patient started  having increased frequency of diarrhea on 01/02/2019 with white count level less than 4.0 with a temperature 100.1 - Previous history of C. difficile colitis back in 2018 in which she received a fecal transplant -C. difficile testing negative  Odynophagia with possible oropharyngeal candidiasis - Unable to use Diflucan because of increased LFTs.  Nystatin orally has been ordered but patient refuses all her meds -will administer when able  Elevated LFTs, improving, unclear etiology - Patient presenting with notable elevated transaminitis.   - Hepatitis panel negative. EBV and CMV IgM are negative.   - Right upper quadrant ultrasound only showed gallbladder sludging.   - CT abdomen/pelvis with no evidence of acute intra-abdominal or pelvic abnormality but with diffuse fluid within the colon suggestive of possible enteritis versus diarrheal illness. - KUB on 12/29/2018 with mild diffuse gaseous prominence without obstructive pattern. Suspicion for possible mild shock liver given she was hypotensive on presentation.   - GI also believes that this transaminitis could reflect from skeletal muscle release as well -CK, MB minimally elevated -likely secondary to prolonged bedbound status at this point - Gastroenterology continue to follow -have offered further abdominal imaging - LFTs generally trending down with improved nutrition and free water flushes  Leukopenia, improving Thrombocytopenia, worsening - Evaluated by hematology previously.  - Patient has been started on vitamin B12 parenterally as she had low levels of vitamin B12 during last admission but vitamin B12 level normal this admission.   - Minimally improving at this point with nutrition, unlikely affected by B12 given timing. - Platelets continue to downtrend currently in the 70s - Outpatient follow-up with hematology.  No plans for bone marrow biopsy during this admission.  Hyponatremia, likely hypovolemic, ongoing - Sodium 128 at  admission likely in the setting of poor p.o. intake and dehydration - Somewhat stabilized currently with IV fluid, free water flushes via NG tube and tube feedings  Hypokalemia, Hypomagnesemia in the setting of poor p.o. intake - Improving, follow daily labs  History of TIA  - Patient has not received Plavix since 12/24/2018 as she has refused.  Stopped Plavix for now for possible need for EGD/liver biopsy or any sort of invasive procedure. - Continue aspirin; although patient refusing all medications at this time  Stage II sacral pressure injury: Present on admission - Continue wound care as per wound care consult recommendations.  Generalized deconditioning - PT recommends SNF placement.  - Daughter thinks that patient will get better and will be able to go home and declines SNF placement.  Ethics: - Patient now representing after recent hospitalization, was home for roughly 1-2 days and returned with worsening weakness, severe dehydration, and adult failure to thrive.  Daughter believes that she just will require some nutrition through artificial means to get her back to her previous baseline. She states that her mother was independent with her ADLs and cooking for herself -unclear timeframe.  Daughter states that her current decline only started on 12/28/2018; which is counter to all nursing reports and previous provider reports during her care during this hospitalization.  Daughter seems to be struggling with the notion that her mother is in the active dying process in regards to her adult failure to thrive in which she is refusing most if not all interventions and has not been eating or drinking now for approximately 11 days.  At this point, daughter agrees to proceed with cortrak tube placement to increase her nutrition, which was completed today.   - Given this patient's lack response and repeat hospitalization in less than a few days from previous discharge, patient's prognosis is  extremely grim, and this has been relayed to the patient's daughter on multiple occasions by multiple providers. - Ethics has been consulted, following along, we appreciate insight recommendations on this difficult case   DVT prophylaxis: SCDs Code Status: Full code Family Communication: Discussed plan of care with patient's daughter extensively over the phone and in person. Greater than 30 minutes was spent at bedside/over the phone. Disposition Plan: Continue inpatient, tube feeds; poor/grim prognosis   Consultants:   Gastroenterology, [place]  Infectious disease  Hematology  Palliative care  Procedures: None  Antimicrobials: None  Subjective: Patient somewhat combative overnight requiring Ativan x1, extremely noncompliant with physical exam this morning, unable to properly assess the patient for evaluate patient's inner thigh or groin area for reported rash due to patient's combativeness.  Objective: Vitals:   01/05/19 2131 01/05/19 2312 01/06/19 0147 01/06/19 0426  BP: (!) 88/46 (!) 97/55 (!) 107/58 (!) 110/55  Pulse: (!) 102 98 90 81  Resp: '20 20 20 20  ' Temp: 99.3 F (37.4 C) 98.6 F (37 C) 97.9 F (36.6 C) 97.8 F (36.6 C)  TempSrc: Oral Oral Oral Oral  SpO2: 97% 96% 97% 99%  Weight:      Height:        Intake/Output Summary (Last 24 hours) at 01/06/2019 0807 Last data filed at 01/05/2019 1700 Gross per 24 hour  Intake 453.98 ml  Output --  Net 453.98 ml   Filed Weights   12/26/18 0500 01/03/19 0500 01/04/19 0500  Weight: 61 kg 61 kg 61 kg  Examination:  General: Cachectic elderly appearing female, resting comfortably, when woken patient become somewhat combative, stating only "no"  HEENT:  Normocephalic atraumatic.  Sclerae nonicteric, noninjected.  Extraocular movements intact bilaterally.  Cortrak NG tube in place Neck:  Without mass or deformity.  Trachea is midline. Lungs:  Clear to auscultate bilaterally without rhonchi, wheeze, or  rales. Heart:  Regular rate and rhythm.  Without murmurs, rubs, or gallops. Abdomen:  Soft, nontender, nondistended.  Without guarding or rebound. Extremities: Without cyanosis, clubbing, edema, or obvious deformity. Vascular:  Dorsalis pedis and posterior tibial pulses palpable bilaterally. Skin:  Warm and dry, no erythema, no ulcerations.  Data Reviewed: I have personally reviewed following labs and imaging studies  CBC: Recent Labs  Lab 12/31/18 0532 01/01/19 0359 01/04/19 0134 01/05/19 0720 01/06/19 0306  WBC 2.6* 2.8* 3.0* 5.1 8.7  HGB 12.2 11.1* 11.0* 9.8* 9.6*  HCT 38.6 33.2* 34.5* 27.9* 27.4*  MCV 98.7 93.8 97.7 90.0 89.5  PLT 82* 88* 86* 79* 75*   Basic Metabolic Panel: Recent Labs  Lab 12/31/18 0532 01/01/19 0359 01/04/19 0134 01/05/19 0153 01/06/19 0306  NA 141 141 130* 129* 130*  K 5.0 4.7 3.3* 3.3* 3.6  CL 122* 123* 112* 110 112*  CO2 14* 14* 13* 13* 15*  GLUCOSE 108* 89 112* 118* 120*  BUN 7* 7* '8 11 17  ' CREATININE 0.78 0.70 0.75 0.60 0.76  CALCIUM 8.5* 8.4* 8.1* 7.6* 7.3*  MG  --   --  1.5*  --   --   PHOS  --   --  2.5  --   --    GFR: Estimated Creatinine Clearance: 41.8 mL/min (by C-G formula based on SCr of 0.76 mg/dL).   Liver Function Tests: Recent Labs  Lab 12/31/18 0532 01/01/19 0359 01/04/19 0134 01/05/19 0153 01/06/19 0306  AST 285* 237* 247* 216* 184*  ALT 111* 94* 92* 79* 65*  ALKPHOS 162* 150* 165* 172* 168*  BILITOT 0.9 0.6 0.8 0.6 0.6  PROT 5.6* 5.3* 5.3* 4.5* 3.8*  ALBUMIN 2.2* 2.0* 1.9* 1.7* 1.4*   Cardiac Enzymes: Recent Labs  Lab 01/05/19 0153 01/06/19 0306  CKTOTAL 229 370*  CKMB 5.6* 6.8*   CBG: Recent Labs  Lab 01/04/19 2007 01/05/19 0026 01/05/19 0335 01/05/19 0832 01/05/19 1247  GLUCAP 116* 108* 98 103* 103*    Recent Results (from the past 240 hour(s))  C difficile quick scan w PCR reflex     Status: None   Collection Time: 01/03/19  5:36 PM   Specimen: Stool  Result Value Ref Range Status   C  Diff antigen NEGATIVE NEGATIVE Final   C Diff toxin NEGATIVE NEGATIVE Final   C Diff interpretation No C. difficile detected.  Final    Comment: Performed at [facility], [place] [address] [place], [place] [address]  Gastrointestinal Panel by PCR , Stool     Status: None   Collection Time: 01/03/19  5:36 PM   Specimen: Stool  Result Value Ref Range Status   Campylobacter species NOT DETECTED NOT DETECTED Final   Plesimonas shigelloides NOT DETECTED NOT DETECTED Final   Salmonella species NOT DETECTED NOT DETECTED Final   Yersinia enterocolitica NOT DETECTED NOT DETECTED Final   Vibrio species NOT DETECTED NOT DETECTED Final   Vibrio cholerae NOT DETECTED NOT DETECTED Final   Enteroaggregative E coli (EAEC) NOT DETECTED NOT DETECTED Final   Enteropathogenic E coli (EPEC) NOT DETECTED NOT DETECTED Final   Enterotoxigenic E coli (ETEC) NOT  DETECTED NOT DETECTED Final   Shiga like toxin producing E coli (STEC) NOT DETECTED NOT DETECTED Final   Shigella/Enteroinvasive E coli (EIEC) NOT DETECTED NOT DETECTED Final   Cryptosporidium NOT DETECTED NOT DETECTED Final   Cyclospora cayetanensis NOT DETECTED NOT DETECTED Final   Entamoeba histolytica NOT DETECTED NOT DETECTED Final   Giardia lamblia NOT DETECTED NOT DETECTED Final   Adenovirus F40/41 NOT DETECTED NOT DETECTED Final   Astrovirus NOT DETECTED NOT DETECTED Final   Norovirus GI/GII NOT DETECTED NOT DETECTED Final   Rotavirus A NOT DETECTED NOT DETECTED Final   Sapovirus (I, II, IV, and V) NOT DETECTED NOT DETECTED Final    Comment: Performed at [facility], [address] [place], [place] [address]    Radiology Studies: No results found.  Scheduled Meds:  aspirin EC  81 mg Oral Daily   folic acid  1 mg Oral Daily   Gerhardt's butt cream   Topical TID   latanoprost  1 drop Both Eyes QHS   megestrol  400 mg Per Tube Daily   mirtazapine  30 mg Per NG tube QHS   multivitamin with minerals  1 tablet Per Tube  Daily   nystatin  5 mL Oral QID   nystatin   Topical TID   pantoprazole  20 mg Oral Daily   sodium chloride flush  3 mL Intravenous Q12H   Continuous Infusions:  sodium chloride     acetaminophen 1,000 mg (01/06/19 0549)   cefTRIAXone (ROCEPHIN)  IV Stopped (01/05/19 1412)   dextrose 5 % and 0.45% NaCl 10 mL/hr at 01/05/19 1700   feeding supplement (OSMOLITE 1.2 CAL) 1,000 mL (01/04/19 1609)     LOS: 17 days   Time spent: >30; more than 20 minutes was spent at bedside/over the phone discussing case with family (daughter).  Lengthy discussion about patient's poor prognosis, multiple morbidities, ongoing poor mental status changes with what appears to be worsening mental status despite improvement in nutrition and free water.  Little Ishikawa, DO [facility] Pager [phone]  If 7PM-7AM, please contact night-coverage [email or web address] Password [facility] 01/06/2019, 8:07 AM

## 2019-01-06 NOTE — Progress Notes (Signed)
Sydney [name]  Date of Admission:  12/19/2018      Total days of antibiotics 1  Ceftriaxone given O/N          ASSESSMENT: Sydney [name] is a 85 y.o. 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 She has been admitted over the last 17 days for care related to her elevated LFTs (decreasing, uncertain as to etiology) and overall failure to thrive. Dr. Megan Salon saw her at the request of her daughter earlier in her admission and requested to hold any antibiotics as there was no signs concerning for infection then to explain the reasons for her care.   Over the last 48 hours she has developed new onset high fevers and increased WBC count; while she does not have a true leukocytosis her counts have quadrupled in correlation with her fever. Viral infection seems unlikely given her nearly 3 weeks hospitalization. Blood cultures are negative at final read. Most likely source at this time seems to be a UTI, although it is difficult to truly determine without urinalysis or validating symptoms - she moans in response to any tactile stimulation on exam making it impossible to determine with certainty if her discomfort is due to bladder tenderness of other aspect of abdomen discomfort. Based on previous urine isolates I would start cefazolin 1 gm IV to treat with 3 day stop day. This will offer reduced risk for CDIff and treat common pathogens, including what she has previously cultured out. I explained to her daughter that if she has no improvement over the next 24 hours she should consider allowing her mother to have urine catheterization done to further determine the true need and duration of antibiotics here. GI is following also and recommended repeating abdomen/pelvis CT as next step, which I would agree with.    A CDiff study was done 3 days ago without any report of diarrhea as well as a GI pathogen panel. I would try to be firm with her  daughter that these tests should only be done if there is clinical suspicion and not out of routine (I explained this over the phone to her daughter). I also mentioned that since she is on tube feedings now her stools will look different and to expect that.   I will start prophylactic Vancomycin 125 mg BID VT to use during treatment with antibiotics and beyond for 48 hours.   I informed her daughter that we will be available for her mother's care should something change in her condition. Tough situation, of course given her history. Agree with palliative conversations to assist with her daughter processing her mother's current illness and multiple medical problems.    PLAN: 1. Change ceftriaxone to cefazolin x 2 more days 2. Vancomycin PO 125 mg BID x 5 days 3. Consider CT scan of abd/pelv to eval if fevers ongoing  4. Consider urine sampling if CT scan unrevealing    ADDENDUM: 5:30 p.m. - notified that the patient has lost IV access and having an extremely difficult time to replace. Will in lieu of this treat with cephalexin 500 mg BID x 2 days.     Principal Problem:   Adult failure to thrive Active Problems:   Pancytopenia (HCC)   Hyponatremia   Protein calorie malnutrition (HCC)   History of TIA (transient ischemic attack)   Generalized weakness   Pressure injury of skin   Transaminitis   Malnutrition  of moderate degree   Evaluation by psychiatric service required   . acetaminophen  1,000 mg Per Tube Q6H  . aspirin  81 mg Per Tube Daily  . cephALEXin  500 mg Per Tube Q12H  . [START ON 99991111 folic acid  1 mg Per Tube Daily  . Gerhardt's butt cream   Topical TID  . latanoprost  1 drop Both Eyes QHS  . megestrol  400 mg Per Tube Daily  . mirtazapine  30 mg Per NG tube QHS  . multivitamin  15 mL Oral Daily  . nystatin  5 mL Oral QID  . nystatin   Topical TID  . pantoprazole sodium  40 mg Per Tube Daily  . sodium chloride flush  3 mL Intravenous Q12H  . vancomycin   125 mg Per Tube Q12H    SUBJECTIVE: Unable to participate in interview.   I spoke with Dr. 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 Last saw [name] in 01-2017 for the same and was having no trouble with symptoms at that time.   She was recently admitted to the hospital on 8/24 with generalized weakness, failure to thrive. Dr. Megan Salon saw her at that time at the request of the patient's daughter, Sydney [name], who is very worried about recurrent CDiff infection should she receive antibiotics again. During hospitalization she was found to have elevated LFTs. She was also pancytopenic. Abd CT scan without any findings worrisome for colitis.   She started developing fevers to 46 F on the morning of 9/09 on hospital day 16. Her WBC counts on admission have been 1.3 - 2.8 but since that time have risen to 8.7K with fever overnight to 103 F. There is concern for UTI given she has had them recently; however her daughter refused I&O cath to collect for UA/Culture. The patient is moaning and non-verbal since admission. She is now requiring tube feedings via cortrack. She has not had any signs of diarrhea per discussion with her nurse and review of chart.    Review of Systems: Review of Systems  Unable to perform ROS: Dementia    Allergies  Allergen Reactions  . Demerol [Meperidine] Other (See Comments)    Excessive sweating, cramping  . Other     No Antibiotic without consultation with her primary physician.  . Codeine Rash  . Sulfa Antibiotics Itching and Other (See Comments)    Reaction unknown  . Tomato Itching    OBJECTIVE: Vitals:   01/05/19 2312 01/06/19 0147 01/06/19 0426 01/06/19 1413  BP: (!) 97/55 (!) 107/58 (!) 110/55 118/61  Pulse: 98 90 81 85  Resp: 20 20 20  (!) 22  Temp: 98.6 F (37 C) 97.9 F (36.6 C) 97.8 F (36.6 C) 97.8 F (36.6 C)  TempSrc:  Oral Oral Oral Oral  SpO2: 96% 97% 99% 100%  Weight:      Height:       Body mass index is 23.82 kg/m.  Physical Exam Vitals signs and nursing note reviewed.  Constitutional:      Comments: Alert in bed sitting upright. Moaning out and saying "come get me, come get me". Thin and undernourished   HENT:     Nose:     Comments: cortrak in place w/o skin breakdown     Mouth/Throat:     Mouth: Mucous membranes are dry.  Cardiovascular:     Rate  and Rhythm: Normal rate and regular rhythm.  Pulmonary:     Effort: Pulmonary effort is normal.     Breath sounds: Normal breath sounds.  Abdominal:     General: There is no distension.     Tenderness: There is no abdominal tenderness.  Musculoskeletal:        General: No swelling.  Skin:    General: Skin is warm and dry.     Capillary Refill: Capillary refill takes less than 2 seconds.  Neurological:     Mental Status: She is disoriented.     Lab Results Lab Results  Component Value Date   WBC 8.7 01/06/2019   HGB 9.6 (L) 01/06/2019   HCT 27.4 (L) 01/06/2019   MCV 89.5 01/06/2019   PLT 75 (L) 01/06/2019    Lab Results  Component Value Date   CREATININE 0.76 01/06/2019   BUN 17 01/06/2019   NA 130 (L) 01/06/2019   K 3.6 01/06/2019   CL 112 (H) 01/06/2019   CO2 15 (L) 01/06/2019    Lab Results  Component Value Date   ALT 65 (H) 01/06/2019   AST 184 (H) 01/06/2019   ALKPHOS 168 (H) 01/06/2019   BILITOT 0.6 01/06/2019     Microbiology: Recent Results (from the past 240 hour(s))  C difficile quick scan w PCR reflex     Status: None   Collection Time: 01/03/19  5:36 PM   Specimen: Stool  Result Value Ref Range Status   C Diff antigen NEGATIVE NEGATIVE Final   C Diff toxin NEGATIVE NEGATIVE Final   C Diff interpretation No C. difficile detected.  Final    Comment: Performed at [facility], [address], [place], [place] [address]  Gastrointestinal Panel by PCR , Stool     Status: None   Collection Time:  01/03/19  5:36 PM   Specimen: Stool  Result Value Ref Range Status   Campylobacter species NOT DETECTED NOT DETECTED Final   Plesimonas shigelloides NOT DETECTED NOT DETECTED Final   Salmonella species NOT DETECTED NOT DETECTED Final   Yersinia enterocolitica NOT DETECTED NOT DETECTED Final   Vibrio species NOT DETECTED NOT DETECTED Final   Vibrio cholerae NOT DETECTED NOT DETECTED Final   Enteroaggregative E coli (EAEC) NOT DETECTED NOT DETECTED Final   Enteropathogenic E coli (EPEC) NOT DETECTED NOT DETECTED Final   Enterotoxigenic E coli (ETEC) NOT DETECTED NOT DETECTED Final   Shiga like toxin producing E coli (STEC) NOT DETECTED NOT DETECTED Final   Shigella/Enteroinvasive E coli (EIEC) NOT DETECTED NOT DETECTED Final   Cryptosporidium NOT DETECTED NOT DETECTED Final   Cyclospora cayetanensis NOT DETECTED NOT DETECTED Final   Entamoeba histolytica NOT DETECTED NOT DETECTED Final   Giardia lamblia NOT DETECTED NOT DETECTED Final   Adenovirus F40/41 NOT DETECTED NOT DETECTED Final   Astrovirus NOT DETECTED NOT DETECTED Final   Norovirus GI/GII NOT DETECTED NOT DETECTED Final   Rotavirus A NOT DETECTED NOT DETECTED Final   Sapovirus (I, II, IV, and V) NOT DETECTED NOT DETECTED Final    Comment: Performed at [facility], [address] [place], [place] [address]     Janene Madeira, MSN, NP-C [facility] [name] Pager: [phone] Office: [place]: [phone]

## 2019-01-07 DIAGNOSIS — R945 Abnormal results of liver function studies: Secondary | ICD-10-CM | POA: Diagnosis not present

## 2019-01-07 DIAGNOSIS — R627 Adult failure to thrive: Secondary | ICD-10-CM | POA: Diagnosis not present

## 2019-01-07 LAB — CBC
HCT: 33 % — ABNORMAL LOW (ref 36.0–46.0)
Hemoglobin: 11.6 g/dL — ABNORMAL LOW (ref 12.0–15.0)
MCH: 31.4 pg (ref 26.0–34.0)
MCHC: 35.2 g/dL (ref 30.0–36.0)
MCV: 89.4 fL (ref 80.0–100.0)
Platelets: 88 10*3/uL — ABNORMAL LOW (ref 150–400)
RBC: 3.69 MIL/uL — ABNORMAL LOW (ref 3.87–5.11)
RDW: 16.2 % — ABNORMAL HIGH (ref 11.5–15.5)
WBC: 16.2 10*3/uL — ABNORMAL HIGH (ref 4.0–10.5)
nRBC: 0 % (ref 0.0–0.2)

## 2019-01-07 LAB — COMPREHENSIVE METABOLIC PANEL
ALT: 61 U/L — ABNORMAL HIGH (ref 0–44)
AST: 186 U/L — ABNORMAL HIGH (ref 15–41)
Albumin: 1.6 g/dL — ABNORMAL LOW (ref 3.5–5.0)
Alkaline Phosphatase: 316 U/L — ABNORMAL HIGH (ref 38–126)
Anion gap: 7 (ref 5–15)
BUN: 21 mg/dL (ref 8–23)
CO2: 12 mmol/L — ABNORMAL LOW (ref 22–32)
Calcium: 7.5 mg/dL — ABNORMAL LOW (ref 8.9–10.3)
Chloride: 111 mmol/L (ref 98–111)
Creatinine, Ser: 0.9 mg/dL (ref 0.44–1.00)
GFR calc Af Amer: >60 mL/min (ref >60)
GFR calc non Af Amer: 58 mL/min — ABNORMAL LOW (ref >60)
Glucose, Bld: 104 mg/dL — ABNORMAL HIGH (ref 70–99)
Potassium: 5.1 mmol/L (ref 3.5–5.1)
Sodium: 130 mmol/L — ABNORMAL LOW (ref 135–145)
Total Bilirubin: 1.2 mg/dL (ref 0.3–1.2)
Total Protein: 4.2 g/dL — ABNORMAL LOW (ref 6.5–8.1)

## 2019-01-07 LAB — GLUCOSE, CAPILLARY
Glucose-Capillary: 124 mg/dL — ABNORMAL HIGH (ref 70–99)
Glucose-Capillary: 64 mg/dL — ABNORMAL LOW (ref 70–99)

## 2019-01-07 MED ORDER — DEXTROSE 50 % IV SOLN
INTRAVENOUS | Status: AC
  Administered 2019-01-07: 10:00:00 25 mL
  Filled 2019-01-07: qty 50

## 2019-01-07 NOTE — Progress Notes (Signed)
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 CT of the abdomen and pelvis was negative for acute intra-abdominal or pelvic abnormality. She was found to have mildly elevated LFTs along with hyponatremia and pancytopenia. She was given IV fluids. ID and GI were consulted.  Palliative care was subsequently consulted for goals of care discussion.  Patient has remained inpatient for greater than 2 weeks with worsening status daily.  Patient's mental status is now deteriorated to moaning, unable to interact in any meaningful way with the patient at this point.  Patient does have ongoing NG tube with medications and food per daughter's wishes.  Patient does remain full code despite multiple discussions of patient's worsening condition, palliative care has been consulted now 3 times with multiple lengthy sessions with the daughter about patient's worsening condition.  Patient has had extensive work-up as below including imaging, labs all of which essentially have been generally unremarkable for overt cause of patient's rapid mental and physical decline.  Patient currently being treated for questionable UTI given febrile events and mental status changes in hopes that this will improve her status although no change/improvement yet, patient continues to have markedly poor p.o. intake and likely concurrent depression ongoing.  Patient is an advanced age, appears severely malnourished at this point, continues to refuse medications and management although daughter continues to request ongoing aggressive treatment, multiple consults as well as  ongoing evaluation despite extensive negative work-up as below.  Per discussion with palliative care as well as GI it appears the patient's daughter is unfortunately unable to cope with the fact that her mother is actively dying.  We continue to follow along closely, offer what support we can, ethics has been consulted given inappropriate requests by daughter for consults and treatment which border on futile.  Additionally patient continues to be somewhat agitated, unclear if patient is in pain or simply agitated due to her worsening mental status, but daughter is requesting we hold off on any further pain medication or anxiolytics.  We would recommend to focus on comfort measures and comfort care at this time, but daughter continues to request ongoing aggressive care daily.  I am not sure any further imaging, lab work or seizures at this time will change our trajectory.  We unfortunately do not have a working diagnosis for patient's rapid decline other than markedly poor p.o. intake in the setting of likely depression other than possible UTI given recent fever and patient's mental status changes.  At this time patient's outlook remains grim, patient remains a full code despite discussion with family.  Patient's mental status worsens daily, currently continuing antibiotic coverage with ID following recommendations appreciated.  Recent abdominal imaging remarkable for ascites only, not surprising given patient's very low albumin level again in the setting of poor p.o. intake.  Patient remains high risk for worsening mental status, aspiration, elevated risk of morbidity mortality given advanced age, chronic comorbid conditions and above.  Assessment & Plan:   Principal Problem:   Adult failure to thrive Active Problems:   Pancytopenia (HCC)   Hyponatremia   Protein calorie malnutrition (HCC)   History of TIA (transient ischemic attack)   Generalized weakness  Pressure injury of skin   Transaminitis    Malnutrition of moderate degree   Evaluation by psychiatric service required    Failure to thrive in an adult, severe protein caloric malnutrition, hypoalbuminemia. POA, worsening - Patient now representing to the hospital with weakness, and failure to thrive with decreased oral intake.   - CT head negative for acute cranial abnormality.   - MR brain no evidence of acute stroke.   - Unclear etiology, but there was thought to be a component of depression and psychiatry was consulted with recommendations of initiation of antidepressant therapy, but patient nor daughter interested at this time. - PT/OT recommends SNF, patient/daughter declined - Continue to encourage increased oral intake; patient with very little oral intake during hospitalization - Continue IV fluids/coretrak tube feeds - Continue Megace for appetite stimulation, patient currently refusing - Attempted NG tube placement 9/5 unsuccessful with multiple attempts - Coretrak placement 01/03/2019; starting tube feeds - Overall prognosis has been discussed by multiple providers which is guarded to poor. Palliative care consultation evaluation appreciated.  - Despite aggressive measures, patient's condition continues to further decline and patient is now essentially nonverbal. - Despite multiple conversations with her daughter, who continues to request aggressive measures as she states her mother's only wishes are "to live"; while the patient continues refusing all interventions and treatment.   - We have high suspicion that her mother suffers from severe depression with recent loss of her sister 2 months ago, although her daughter continues to think that this Link Snuffer is completely wrong  Acute metabolic encephalopathy, likely secondary to above, worsening - Patient remains altered, without any meaningful interaction, moaning when examined -poorly interactive, unable to follow commands -speech now unintelligible - At this time unclear  etiology other than poor p.o. intake and failure to thrive. - Patient did receive a small dose lorazepam earlier in the week, this is been discontinued, unlikely polypharmacy at this point  ?UTI given encephalopathy and fever Yeast infection, groin/oral thrush - Unable to assess for symptoms given patient's mental status -but patient does continue to reach for groin and moan in what appears to be pain/discomfort -Patient now with leukocytosis at 16, afebrile for >24h - Patient continues to be somewhat difficult/combative, will treat prophylactically as getting a clean urine from the patient is likely going to be acutely difficult - Treat UTI per ID following, appreciate recommendations - Nystatin topical powder to groin, nystatin oral for p.o. thrush  ? Episode of diarrhea, CDiff ruled out - Patient started having increased frequency of somewhat loose stool while on tube feeds, previously afebrile, remains without leukocytosis -Patient now febrile as above - fever of 102 on 01/05/2019 - Previous history of C. difficile colitis back in 2018 in which she received a fecal transplant -C. difficile testing negative  Odynophagia with possible oropharyngeal candidiasis - Unable to use Diflucan because of increased LFTs.  Nystatin orally has been ordered but patient refuses all her meds -will administer when able  Elevated LFTs, improving, unclear etiology, POA - Patient presenting with notable elevated transaminitis.   - Hepatitis panel negative. EBV and CMV IgM are negative.   - Right upper quadrant ultrasound only showed gallbladder sludging.   - CT abdomen/pelvis with no evidence of acute intra-abdominal or pelvic abnormality but with diffuse fluid within the colon suggestive of possible enteritis versus diarrheal illness. - KUB on 12/29/2018 with mild diffuse gaseous prominence without obstructive pattern. Suspicion for possible mild shock liver given she was hypotensive on presentation.   -  GI  also believes that this transaminitis could reflect from skeletal muscle release as well -CK, MB minimally elevated -likely secondary to prolonged bedbound status at this point - GI following, repeat CT on 01/06/2019 remarkable for ascites - LFTs essentially stable and minimally elevated at this point  Leukopenia,  Resolving -now leukocytosis as above Thrombocytopenia, stable - Evaluated by hematology previously.  - Patient has been started on vitamin B12 parenterally as she had low levels of vitamin B12 during last admission but vitamin B12 level normal this admission.   - Minimally improving at this point with nutrition, unlikely affected by B12 given timing. - Platelets currently in the 80s, improving previously in the 70s - Outpatient follow-up with hematology.  No plans for bone marrow biopsy during this admission.  Hyponatremia, likely hypovolemic, ongoing - Sodium 128 at admission likely in the setting of poor p.o. intake and dehydration - Somewhat stabilized currently with IV fluid, free water flushes via NG tube and tube feedings  Hypokalemia, Hypomagnesemia in the setting of poor p.o. intake - Improving, follow daily labs  History of TIA  - Patient has not received Plavix since 12/24/2018 as she has refused.  Stopped Plavix for now for possible need for EGD/liver biopsy or any sort of invasive procedure. - Continue aspirin; although patient refusing all medications at this time  Stage II sacral pressure injury: Present on admission - Continue wound care as per wound care consult recommendations.  Generalized deconditioning - PT recommends SNF placement  - Daughter thinks that patient will get better and will be able to go home and declines SNF placement.  Ethics: - Patient now representing after recent hospitalization, was home for roughly 1-2 days and returned with worsening weakness, severe dehydration, and adult failure to thrive.  Daughter believes that she just will  require some nutrition through artificial means to get her back to her previous baseline. She states that her mother was independent with her ADLs and cooking for herself -unclear timeframe.  Daughter states that her current decline only started on 12/28/2018; which is counter to all nursing reports and previous provider reports during her care during this hospitalization.  Daughter seems to be struggling with the notion that her mother is in the active dying process in regards to her adult failure to thrive in which she is refusing most if not all interventions and has not been eating or drinking now for approximately 11 days.  At this point, daughter agrees to proceed with cortrak tube placement to increase her nutrition, which was completed today.   - Given this patient's lack response and repeat hospitalization in less than a few days from previous discharge, patient's prognosis is extremely grim, and this has been relayed to the patient's daughter on multiple occasions by multiple providers. - Ethics has been consulted, following along, we appreciate insight recommendations on this difficult case   DVT prophylaxis: SCDs Code Status: Full code Family Communication: Discussed plan of care with patient's daughter extensively over the phone and in person. Greater than 30 minutes was spent at bedside/over the phone. Disposition Plan: Continue inpatient, tube feeds; poor/grim prognosis   Consultants:   Gastroenterology, [place]  Infectious disease  Hematology  Palliative care  Procedures: None  Subjective: No acute issues or events reported overnight, patient continues to cry out and moan and what appears to be pain with no meaningful interaction or discernible  speech.  Objective: Vitals:   01/06/19 2131 01/07/19 0340 01/07/19 0828 01/07/19 1257  BP: 135/83 Marland Kitchen)  143/82 (!) 107/55 (!) 149/65  Pulse: 95 93 79 90  Resp: '16 18 18 18  ' Temp: (!) 97.4 F (36.3 C) 97.7 F (36.5 C) (!) 97.5 F  (36.4 C) 97.7 F (36.5 C)  TempSrc: Oral Oral Axillary Axillary  SpO2: 100% 96% 98% 98%  Weight:      Height:        Intake/Output Summary (Last 24 hours) at 01/07/2019 1401 Last data filed at 01/07/2019 1015 Gross per 24 hour  Intake 576 ml  Output --  Net 576 ml   Filed Weights   12/26/18 0500 01/03/19 0500 01/04/19 0500  Weight: 61 kg 61 kg 61 kg    Examination:  General: Gaunt appearing elderly woman, resting comfortably, when aroused/provoked patient moans unintelligibly reaching for her lower abdomen/groin. Somewhat combative when attempting to evaluate HEENT:  Normocephalic atraumatic.  Sclerae nonicteric, noninjected.  Extraocular movements intact bilaterally.  Oral thrush noted.  Poor dentition. Neck:  Without mass or deformity.  Trachea is midline. Lungs:  Clear to auscultate bilaterally without rhonchi, wheeze, or rales. Heart:  Regular rate and rhythm.  Without murmurs, rubs, or gallops. Abdomen: Soft, nontender, nondistended.  Without guarding or rebound. GU: Notable Candida at labial folds Extremities: Without cyanosis, clubbing, edema, or obvious deformity. Vascular:  Dorsalis pedis and posterior tibial pulses palpable bilaterally. Skin:  Warm and dry, no erythema, no ulcerations.  Data Reviewed: I have personally reviewed following labs and imaging studies  CBC: Recent Labs  Lab 01/01/19 0359 01/04/19 0134 01/05/19 0720 01/06/19 0306 01/07/19 0750  WBC 2.8* 3.0* 5.1 8.7 16.2*  HGB 11.1* 11.0* 9.8* 9.6* 11.6*  HCT 33.2* 34.5* 27.9* 27.4* 33.0*  MCV 93.8 97.7 90.0 89.5 89.4  PLT 88* 86* 79* 75* 88*   Basic Metabolic Panel: Recent Labs  Lab 01/01/19 0359 01/04/19 0134 01/05/19 0153 01/06/19 0306 01/07/19 0750  NA 141 130* 129* 130* 130*  K 4.7 3.3* 3.3* 3.6 5.1  CL 123* 112* 110 112* 111  CO2 14* 13* 13* 15* 12*  GLUCOSE 89 112* 118* 120* 104*  BUN 7* '8 11 17 21  ' CREATININE 0.70 0.75 0.60 0.76 0.90  CALCIUM 8.4* 8.1* 7.6* 7.3* 7.5*  MG  --   1.5*  --   --   --   PHOS  --  2.5  --   --   --    GFR: Estimated Creatinine Clearance: 37.1 mL/min (by C-G formula based on SCr of 0.9 mg/dL).   Liver Function Tests: Recent Labs  Lab 01/01/19 0359 01/04/19 0134 01/05/19 0153 01/06/19 0306 01/07/19 0750  AST 237* 247* 216* 184* 186*  ALT 94* 92* 79* 65* 61*  ALKPHOS 150* 165* 172* 168* 316*  BILITOT 0.6 0.8 0.6 0.6 1.2  PROT 5.3* 5.3* 4.5* 3.8* 4.2*  ALBUMIN 2.0* 1.9* 1.7* 1.4* 1.6*   Cardiac Enzymes: Recent Labs  Lab 01/05/19 0153 01/06/19 0306  CKTOTAL 229 370*  CKMB 5.6* 6.8*   CBG: Recent Labs  Lab 01/05/19 0832 01/05/19 1247 01/06/19 0814 01/07/19 0935 01/07/19 1004  GLUCAP 103* 103* 100* 64* 124*    Recent Results (from the past 240 hour(s))  C difficile quick scan w PCR reflex     Status: None   Collection Time: 01/03/19  5:36 PM   Specimen: Stool  Result Value Ref Range Status   C Diff antigen NEGATIVE NEGATIVE Final   C Diff toxin NEGATIVE NEGATIVE Final   C Diff interpretation No C. difficile detected.  Final  Comment: Performed at [facility], [place] [address], [place], [place] [address]  Gastrointestinal Panel by PCR , Stool     Status: None   Collection Time: 01/03/19  5:36 PM   Specimen: Stool  Result Value Ref Range Status   Campylobacter species NOT DETECTED NOT DETECTED Final   Plesimonas shigelloides NOT DETECTED NOT DETECTED Final   Salmonella species NOT DETECTED NOT DETECTED Final   Yersinia enterocolitica NOT DETECTED NOT DETECTED Final   Vibrio species NOT DETECTED NOT DETECTED Final   Vibrio cholerae NOT DETECTED NOT DETECTED Final   Enteroaggregative E coli (EAEC) NOT DETECTED NOT DETECTED Final   Enteropathogenic E coli (EPEC) NOT DETECTED NOT DETECTED Final   Enterotoxigenic E coli (ETEC) NOT DETECTED NOT DETECTED Final   Shiga like toxin producing E coli (STEC) NOT DETECTED NOT DETECTED Final   Shigella/Enteroinvasive E coli (EIEC) NOT DETECTED NOT DETECTED Final    Cryptosporidium NOT DETECTED NOT DETECTED Final   Cyclospora cayetanensis NOT DETECTED NOT DETECTED Final   Entamoeba histolytica NOT DETECTED NOT DETECTED Final   Giardia lamblia NOT DETECTED NOT DETECTED Final   Adenovirus F40/41 NOT DETECTED NOT DETECTED Final   Astrovirus NOT DETECTED NOT DETECTED Final   Norovirus GI/GII NOT DETECTED NOT DETECTED Final   Rotavirus A NOT DETECTED NOT DETECTED Final   Sapovirus (I, II, IV, and V) NOT DETECTED NOT DETECTED Final    Comment: Performed at [facility], [address], [place], [place] [address]    Radiology Studies: Ct Abdomen Pelvis W Contrast  Result Date: 01/06/2019 CLINICAL DATA:  Abdominal pain EXAM: CT ABDOMEN AND PELVIS WITH CONTRAST TECHNIQUE: Multidetector CT imaging of the abdomen and pelvis was performed using the standard protocol following bolus administration of intravenous contrast. CONTRAST:  148m OMNIPAQUE IOHEXOL 300 MG/ML  SOLN COMPARISON:  CT December 20, 2018 FINDINGS: Lower chest: Moderate bilateral effusions with adjacent areas of passive atelectasis. Mild airways thickening is present. Normal heart size. No pericardial effusion. Hepatobiliary: Stable 11 mm hepatic cyst. No concerning liver abnormality is seen. No calcified gallstones, gallbladder wall thickening or biliary ductal dilatation. Pancreas: Unremarkable. No pancreatic ductal dilatation or surrounding inflammatory changes. Spleen: Diminutive spleen. Otherwise unremarkable. Adrenals/Urinary Tract: Adrenal glands are unremarkable. Kidneys are normal, without renal calculi, focal lesion, or hydronephrosis. Urinary bladder is distended but otherwise unremarkable. Stomach/Bowel: Transesophageal tube tip terminates at the level of the gastric antrum. There is edematous mural thickening of much of the small bowel and more focally pronounced in the cecum and ascending colon. Distal colon assumes a less thickened appearance. The appendix is not well visualized.  Vascular/Lymphatic: Atherosclerotic plaque within the normal caliber aorta. Portal and hepatic veins are patent. Venous engorgement of the gonadal veins. No suspicious or enlarged lymph nodes in the included lymphatic chains. Reproductive: Parametrial calcifications and engorged gonadal veins, as above. Few scattered calcified pelvic fibroids within a retroverted uterus. No discernible adnexal lesions. Other: Moderate volume of free intraperitoneal ascites with diffusely increased attenuation of the mesenteric leaflets and fluid tracking and bilateral inguinal hernias. Extensive circumferential body wall edema is present as well. As well as milder edematous changes in the soft tissues of the imaged extremities. No free intraperitoneal air Musculoskeletal: Degenerative changes present in both wrists. Multilevel degenerative changes are present in the imaged portions of the spine. Disc bulges present at L3-4 and L4-5. Vacuum disc noted at L3-4 as well. IMPRESSION: 1. Moderate volume of free intraperitoneal ascites, bilateral effusions, circumferential body wall and extremity edema  compatible with anasarca. 2. Edematous mural thickening of much of the small bowel, more focally pronounced in the cecum and ascending colon, possibly reflective of the patient's fluid status though cannot exclude underlying enterocolitis. Correlate with symptoms. 3. Venous engorgement of the gonadal veins, which can be seen with pelvic congestion syndrome. 4. Retroverted fibroid uterus. 5. Bilateral fluid-filled inguinal hernias 6. Aortic Atherosclerosis (ICD10-I70.0). Electronically Signed   By: Lovena Le M.D.   On: 01/06/2019 21:13    Scheduled Meds:  acetaminophen  1,000 mg Per Tube Q6H   aspirin  81 mg Per Tube Daily   cephALEXin  500 mg Per Tube Y38O   folic acid  1 mg Per Tube Daily   Gerhardt's butt cream   Topical TID   latanoprost  1 drop Both Eyes QHS   megestrol  400 mg Per Tube Daily   mirtazapine  30 mg  Per NG tube QHS   multivitamin  15 mL Oral Daily   nystatin  5 mL Oral QID   nystatin   Topical TID   pantoprazole sodium  40 mg Per Tube Daily   sodium chloride flush  3 mL Intravenous Q12H   vancomycin  125 mg Per Tube Q12H   Continuous Infusions:  sodium chloride     dextrose 5 % and 0.45% NaCl 10 mL/hr at 01/05/19 1700   feeding supplement (OSMOLITE 1.2 CAL) 1,000 mL (01/06/19 1823)     LOS: 18 days   Time spent: >30  Little Ishikawa, DO [facility] Pager [phone]  If 7PM-7AM, please contact night-coverage [email or web address] Password [facility] 01/07/2019, 2:01 PM

## 2019-01-07 NOTE — Plan of Care (Signed)

## 2019-01-07 NOTE — Progress Notes (Signed)
Hypoglycemic Event  CBG: 64  Treatment: 95mL D50 admin   Symptoms: asymptomatic  Follow-up CBG: Time: 1004 CBG Result: 124  Possible Reasons for Event: unknown  Comments/MD notified: Holli Humbles, MD paged and aware  Will continue to monitor.     Racheal Patches, RN

## 2019-01-07 NOTE — Progress Notes (Signed)
Progress Note   Subjective  No further fevers but WBC rising. No clinical change in patient. CT scan done yesterday.   Objective   Vital signs in last 24 hours: Temp:  [97.4 F (36.3 C)-97.8 F (36.6 C)] 97.7 F (36.5 C) (09/12 1257) Pulse Rate:  [79-95] 90 (09/12 1257) Resp:  [16-22] 18 (09/12 1257) BP: (107-149)/(55-83) 149/65 (09/12 1257) SpO2:  [96 %-100 %] 98 % (09/12 1257) Last BM Date: 01/06/19 General:    AA female with feeding tube in place, moaning at times, not communicating Heart:  Regular rate and rhythm Lungs: Respirations even and unlabored, lungs CTA bilaterally Abdomen:  Soft,  nondistended - patient clutches lower abdomen at times, Extremities:  (+) LE edema.   Intake/Output from previous day: 09/11 0701 - 09/12 0700 In: 576 [NG/GT:426; IV Piggyback:150] Out: -  Intake/Output this shift: No intake/output data recorded.  Lab Results: Recent Labs    01/05/19 0720 01/06/19 0306 01/07/19 0750  WBC 5.1 8.7 16.2*  HGB 9.8* 9.6* 11.6*  HCT 27.9* 27.4* 33.0*  PLT 79* 75* 88*   BMET Recent Labs    01/05/19 0153 01/06/19 0306 01/07/19 0750  NA 129* 130* 130*  K 3.3* 3.6 5.1  CL 110 112* 111  CO2 13* 15* 12*  GLUCOSE 118* 120* 104*  BUN 11 17 21   CREATININE 0.60 0.76 0.90  CALCIUM 7.6* 7.3* 7.5*   LFT Recent Labs    01/07/19 0750  PROT 4.2*  ALBUMIN 1.6*  AST 186*  ALT 61*  ALKPHOS 316*  BILITOT 1.2   PT/INR No results for input(s): LABPROT, INR in the last 72 hours.  Studies/Results: Ct Abdomen Pelvis W Contrast  Result Date: 01/06/2019 CLINICAL DATA:  Abdominal pain EXAM: CT ABDOMEN AND PELVIS WITH CONTRAST TECHNIQUE: Multidetector CT imaging of the abdomen and pelvis was performed using the standard protocol following bolus administration of intravenous contrast. CONTRAST:  161mL OMNIPAQUE IOHEXOL 300 MG/ML  SOLN COMPARISON:  CT December 20, 2018 FINDINGS: Lower chest: Moderate bilateral effusions with adjacent areas of  passive atelectasis. Mild airways thickening is present. Normal heart size. No pericardial effusion. Hepatobiliary: Stable 11 mm hepatic cyst. No concerning liver abnormality is seen. No calcified gallstones, gallbladder wall thickening or biliary ductal dilatation. Pancreas: Unremarkable. No pancreatic ductal dilatation or surrounding inflammatory changes. Spleen: Diminutive spleen. Otherwise unremarkable. Adrenals/Urinary Tract: Adrenal glands are unremarkable. Kidneys are normal, without renal calculi, focal lesion, or hydronephrosis. Urinary bladder is distended but otherwise unremarkable. Stomach/Bowel: Transesophageal tube tip terminates at the level of the gastric antrum. There is edematous mural thickening of much of the small bowel and more focally pronounced in the cecum and ascending colon. Distal colon assumes a less thickened appearance. The appendix is not well visualized. Vascular/Lymphatic: Atherosclerotic plaque within the normal caliber aorta. Portal and hepatic veins are patent. Venous engorgement of the gonadal veins. No suspicious or enlarged lymph nodes in the included lymphatic chains. Reproductive: Parametrial calcifications and engorged gonadal veins, as above. Few scattered calcified pelvic fibroids within a retroverted uterus. No discernible adnexal lesions. Other: Moderate volume of free intraperitoneal ascites with diffusely increased attenuation of the mesenteric leaflets and fluid tracking and bilateral inguinal hernias. Extensive circumferential body wall edema is present as well. As well as milder edematous changes in the soft tissues of the imaged extremities. No free intraperitoneal air Musculoskeletal: Degenerative changes present in both wrists. Multilevel degenerative changes are present in the imaged portions of the spine. Disc bulges present at L3-4  and L4-5. Vacuum disc noted at L3-4 as well. IMPRESSION: 1. Moderate volume of free intraperitoneal ascites, bilateral effusions,  circumferential body wall and extremity edema compatible with anasarca. 2. Edematous mural thickening of much of the small bowel, more focally pronounced in the cecum and ascending colon, possibly reflective of the patient's fluid status though cannot exclude underlying enterocolitis. Correlate with symptoms. 3. Venous engorgement of the gonadal veins, which can be seen with pelvic congestion syndrome. 4. Retroverted fibroid uterus. 5. Bilateral fluid-filled inguinal hernias 6. Aortic Atherosclerosis (ICD10-I70.0). Electronically Signed   By: Lovena Le M.D.   On: 01/06/2019 21:13       Assessment / Plan:    85 y/o female admitted with failure to thrive, mild transaminitis. She has had progressive worsening, refusing medications / oral intake, on tube feeds, worsening nutritional status with severe hypoalbuminemia. More recent rise in WBC and fevers, clinically most concerning for UTI, appreciate ID assistance.  CT scan done yesterday - there is diffuse anasarca from her hypoalbuminemic state, ascites, pleural effusions, bowel wall thickening. I discussed the findings with the daughter at length (> 30 minutes). I think the bowel thickening is likely reflective her her hypoalbuminemia / anasarca. She had a recent negative infectious workup, currently not having diarrhea. I don't think colonoscopy would likely show anything different and she certainly would not tolerate a bowel prep or a colonoscopy in her current state. We discussed the ascites, again likely reflective of malnutrition. It is possible this could be infected / SBP, we discussed that paracentesis would be need to confirm that, versus keeping her on an antibiotic regimen to cover that (she has been on ceftriaxone recently). She would want to avoid paracentesis if possible, will see if ID recommends broadening antibiotics or change given rise in WBC. She is being treated empirically for UTI and on oral vancomycin as well to help prevent  recurrent C Diff.  Daughter asked about possibility for peptic ulcer. She has been on empiric treatment for that with protonix. We discussed role of EGD, she agreed her mother would probably not want that and I don't think likely to change her management given nothing concerning found in that area on CT scan. Would continue empiric PPI regardless.  We again discussed the liver enzyme elevation - AST / ALT continues to improve, AP somewhat increased today. CT did not show any concerning biliary / hepatic pathology. I think this elevation is secondary to underlying systemic process, have suspected AST predominant elevation could be due to skeletal muscle breakdown as CKs have variable been elevated. We discussed role of liver biopsy, again do not think that is likely to change her management now and put her at risk for complications given her anasarca / ascites.  Very difficult situation, daughter suggests multi-D meeting with all specialists and palliative care which we can do in the near future. Patient continues to deteriorate, her prognosis is poor.   Please call if any new questions. Will follow peripherally the rest of the weekend.  Beaconsfield Cellar, MD [facility]

## 2019-01-07 NOTE — Progress Notes (Addendum)
0800: On arrival to shift, pt moaning and noted to have slight gurgling sounds in throat. Avon Gully, MD aware. Tube feeding stopped at this time and to remain off for 2 hours per Avon Gully, MD. No residual at this time. Will continue to monitor.  1000: Tube feeding restarted per Avon Gully, MD. Will continue to monitor.

## 2019-01-07 NOTE — Progress Notes (Signed)
Patient returned to unit from Imaging.

## 2019-01-08 DIAGNOSIS — R945 Abnormal results of liver function studies: Secondary | ICD-10-CM | POA: Diagnosis not present

## 2019-01-08 DIAGNOSIS — R627 Adult failure to thrive: Secondary | ICD-10-CM | POA: Diagnosis not present

## 2019-01-08 LAB — CBC
HCT: 32.2 % — ABNORMAL LOW (ref 36.0–46.0)
Hemoglobin: 11.6 g/dL — ABNORMAL LOW (ref 12.0–15.0)
MCH: 31.3 pg (ref 26.0–34.0)
MCHC: 36 g/dL (ref 30.0–36.0)
MCV: 86.8 fL (ref 80.0–100.0)
Platelets: 93 10*3/uL — ABNORMAL LOW (ref 150–400)
RBC: 3.71 MIL/uL — ABNORMAL LOW (ref 3.87–5.11)
RDW: 15.8 % — ABNORMAL HIGH (ref 11.5–15.5)
WBC: 14.5 10*3/uL — ABNORMAL HIGH (ref 4.0–10.5)
nRBC: 0 % (ref 0.0–0.2)

## 2019-01-08 LAB — COMPREHENSIVE METABOLIC PANEL
ALT: 61 U/L — ABNORMAL HIGH (ref 0–44)
AST: 189 U/L — ABNORMAL HIGH (ref 15–41)
Albumin: 1.8 g/dL — ABNORMAL LOW (ref 3.5–5.0)
Alkaline Phosphatase: 444 U/L — ABNORMAL HIGH (ref 38–126)
Anion gap: 5 (ref 5–15)
BUN: 26 mg/dL — ABNORMAL HIGH (ref 8–23)
CO2: 16 mmol/L — ABNORMAL LOW (ref 22–32)
Calcium: 7.7 mg/dL — ABNORMAL LOW (ref 8.9–10.3)
Chloride: 109 mmol/L (ref 98–111)
Creatinine, Ser: 1.06 mg/dL — ABNORMAL HIGH (ref 0.44–1.00)
GFR calc Af Amer: 55 mL/min — ABNORMAL LOW (ref >60)
GFR calc non Af Amer: 48 mL/min — ABNORMAL LOW (ref >60)
Glucose, Bld: 109 mg/dL — ABNORMAL HIGH (ref 70–99)
Potassium: 4.6 mmol/L (ref 3.5–5.1)
Sodium: 130 mmol/L — ABNORMAL LOW (ref 135–145)
Total Bilirubin: 1.4 mg/dL — ABNORMAL HIGH (ref 0.3–1.2)
Total Protein: 5 g/dL — ABNORMAL LOW (ref 6.5–8.1)

## 2019-01-08 LAB — GLUCOSE, CAPILLARY: Glucose-Capillary: 74 mg/dL (ref 70–99)

## 2019-01-08 LAB — URINALYSIS, ROUTINE W REFLEX MICROSCOPIC
Bilirubin Urine: NEGATIVE
Glucose, UA: NEGATIVE mg/dL
Ketones, ur: NEGATIVE mg/dL
Leukocytes,Ua: NEGATIVE
Nitrite: NEGATIVE
Protein, ur: NEGATIVE mg/dL
Specific Gravity, Urine: 1.018 (ref 1.005–1.030)
pH: 6 (ref 5.0–8.0)

## 2019-01-08 NOTE — Progress Notes (Addendum)
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 CT of the abdomen and pelvis was negative for acute intra-abdominal or pelvic abnormality. She was found to have mildly elevated LFTs along with hyponatremia and pancytopenia. She was given IV fluids. ID and GI were consulted.  Palliative care was subsequently consulted for goals of care discussion.  Patient has remained inpatient for greater than 2 weeks with worsening status daily. Patient's mental status is now deteriorated to moaning, we are now unable to interact in any meaningful way with the patient at this point. Patient does have ongoing NG tube with medications and food per daughter's wishes.  Patient does remain full code despite multiple discussions of patient's worsening condition, palliative care has been consulted now 3 times with multiple lengthy sessions with the daughter about patient's worsening condition.  Patient has had extensive work-up as below including imaging, labs all of which essentially have been generally unremarkable for overt cause of patient's rapid mental and physical decline.  Patient currently being treated for questionable UTI given febrile events and mental status changes in hopes that this will improve her status although no change/improvement yet, patient continues to have markedly poor p.o. intake and likely concurrent depression ongoing.  Patient is an advanced age, appears severely malnourished at this point, continues to refuse medications and management although daughter continues to request ongoing aggressive treatment, multiple consults as  well as ongoing evaluation despite extensive negative work-up as below.  Per discussion with palliative care as well as GI it appears the patient's daughter is unfortunately unable to cope with the fact that her mother is actively dying.  We continue to follow along closely, offer what support we can, ethics has been consulted given inappropriate requests by daughter for consults and treatment which border on futile (requesting at one point for obgyn to evaluate for possible exam to rule out vaginal cancer which we discussed wouldn't be tolerated - patient then asked for blood work to rule out GU cancers).  Additionally patient continues to be somewhat agitated, unclear if patient is in pain or simply agitated due to her worsening mental status, but daughter is requesting we hold off on any further pain medication or anxiolytics.  We would recommend to focus on comfort measures and comfort care at this time, but daughter continues to request further workup.  I am doubtful that any further imaging, lab work or seizures at this time will change our trajectory.  We unfortunately do not have a working diagnosis for patient's rapid decline other than markedly poor p.o. intake in the setting of likely depression other than possible UTI given recent fever and patient's mental status changes. Patient's daughter now requesting 'all physicians involved in her mother's care' to be at a meeting in the next few days to help provide her with information about current care plan. Patient's daughter also fixated on single dose of 0.72m lorazepam and 0.575mof morphine overnight on 01/04/19 and 01/05/19 respectively that she believes has caused her mother's current nonverbal condition despite our explanation that this medication would have likely cleared within a few hours of administration.  At this time patient's outlook remains grim, patient remains a  full code despite discussion with family.  Patient's mental status worsens daily,  currently continuing antibiotic coverage with ID following recommendations appreciated.  Recent abdominal imaging remarkable for ascites only, not surprising given patient's very low albumin level again in the setting of poor p.o. intake.  Patient remains high risk for worsening mental status, aspiration, elevated risk of morbidity mortality given advanced age, chronic comorbid conditions and above.   Assessment & Plan:   Principal Problem:   Adult failure to thrive Active Problems:   Pancytopenia (HCC)   Hyponatremia   Protein calorie malnutrition (HCC)   History of TIA (transient ischemic attack)   Generalized weakness   Pressure injury of skin   Transaminitis   Malnutrition of moderate degree   Evaluation by psychiatric service required   Failure to thrive in an adult, severe protein caloric malnutrition, hypoalbuminemia. POA, worsening - Patient now representing to the hospital with weakness, and failure to thrive with decreased oral intake. - CT head negative for acute cranial abnormality - MR brain no evidence of acute stroke - Unclear etiology, but there was thought to be a component of depression and psychiatry was consulted with recommendations of initiation of antidepressant therapy, but patient nor daughter interested at this time - PT/OT recommends SNF, patient/daughter declined - Continue to encourage increased oral intake; patient with very little oral intake during hospitalization - Continue IV fluids/coretrak tube feeds - Continue Megace for appetite stimulation, patient currently refusing - Attempted NG tube placement 9/5 unsuccessful with multiple attempts - Coretrak placement 01/03/2019; starting tube feeds - Overall prognosis has been discussed by multiple providers which is guarded to poor. Palliative care consultation evaluation appreciated. - Despite aggressive measures, patient's condition continues to further decline and patient is now essentially nonverbal -  Despite multiple conversations with her daughter, who continues to request aggressive measures as she states her mother's only wishes are "to live"; while the patient continues refusing all interventions and treatment - We have high suspicion that her mother suffers from severe depression with recent loss of her sister 2 months ago, although her daughter continues to think that this Link Snuffer is completely wrong  Acute metabolic encephalopathy, likely secondary to above, worsening - Patient remains altered, without any meaningful interaction, moaning when examined  - Poorly interactive, unable to follow commands -speech now unintelligible - At this time unclear etiology other than poor p.o. intake and failure to thrive. - Patient did receive a small dose lorazepam earlier in the week, this is been discontinued, unlikely polypharmacy at this point.  ?UTI given encephalopathy and fever Yeast infection, groin/oral thrush - Unable to assess for symptoms given patient's mental status -but patient does continue to reach for groin and moan in what appears to be pain/discomfort - Patient now with leukocytosis at 16, afebrile for >24h - Patient continues to be somewhat difficult/combative, will treat prophylactically as getting a clean urine from the patient is likely going to be acutely difficult - Treat UTI per ID following, appreciate recommendations - Nystatin topical powder to groin, nystatin oral for p.o. thrush  Elevated LFTs, improving, unclear etiology, POA Concurrent ascites - Patient presenting with notable elevated transaminitis; low albumin - Hepatitis panel negative. EBV and CMV IgM are negative.   - Right upper quadrant ultrasound only showed gallbladder sludging.   - CT abdomen/pelvis with no evidence of acute intra-abdominal or pelvic abnormality but with diffuse fluid within the colon suggestive of possible enteritis versus diarrheal illness. - KUB on 12/29/2018 with mild diffuse gaseous  prominence without obstructive pattern. Suspicion for possible mild shock liver given she was hypotensive on presentation.   - GI also believes that this transaminitis could reflect from skeletal muscle release as well -CK, MB minimally elevated -likely secondary to prolonged bedbound status at this point - GI following, repeat CT on 01/06/2019 remarkable for ascites -AST ALT continue to be somewhat stable although elevated -Alk phos and total bilirubin continue to increase - defer to GI  Leukopenia -->now leukocytosis as above Thrombocytopenia, stable - Evaluated by hematology previously - Patient has been started on vitamin B12 parenterally as she had low levels of vitamin B12 during last admission but vitamin B12 level normal this admission - Minimally improving at this point with nutrition, unlikely affected by B12 given timing - Platelets currently in the 80s, improving previously in the 70s - Outpatient follow-up with hematology. No plans for bone marrow biopsy during this admission  AKI, mild, ongoing -Despite ongoing free water flushes, tube feeds patient's creatinine continues to somewhat worsen -Likely in the setting of failure to thrive as above with marked hypo-albuminemia  Hyponatremia, likely hypovolemic, ongoing - Sodium 128 at admission likely in the setting of poor p.o. intake and dehydration - Somewhat stabilized currently with IV fluid, free water flushes via NG tube and tube feedings  Hypokalemia, Hypomagnesemia in the setting of poor p.o. intake - Improving, follow daily labs  History of TIA  - Patient has not received Plavix since 12/24/2018 as she has refused.  Stopped Plavix for now for possible need for EGD/liver biopsy or any sort of invasive procedure. - Continue aspirin; although patient refusing all medications at this time  Stage II sacral pressure injury: Present on admission - Continue wound care as per wound care consult recommendations.  ? Episode of  diarrhea, CDiff ruled out - Patient started having increased frequency of somewhat loose stool while on tube feeds, previously afebrile, remains without leukocytosis - Patient now febrile as above - fever of 102 on 01/05/2019 - Previous history of C. difficile colitis back in 2018 in which she received a fecal transplant -C. difficile testing negative  Odynophagia with possible oropharyngeal candidiasis - Unable to use Diflucan because of increased LFTs.  Nystatin orally has been ordered but patient refuses all her meds -will administer when able  Generalized deconditioning, POA - PT recommends SNF placement  - Daughter thinks that patient will get better and will be able to go home and declines SNF placement.  Ethics: - Patient now re-presenting after recent hospitalization, was home for roughly 1-2 days and returned with worsening weakness, severe dehydration, and adult failure to thrive.  Daughter believes that she just will require some nutrition through artificial means to get her back to her previous baseline. She states that her mother was independent with her ADLs and cooking for herself prior to admission (unclear timeframe).  Daughter states that her current decline was acute and only started on 12/28/2018; which is contrary to all nursing reports and previous provider reports during her care during this hospitalization.  Daughter seems to be struggling with the notion that her mother is in the active dying process in regards to her adult failure to thrive in which she is refusing most if not all interventions and has not been eating or drinking now for approximately 11 days. At this point, daughter agrees to proceed with cortrak tube placement to increase her nutrition, which was completed today.   - Given this patient's lack response and repeat hospitalization in less  than a few days from previous discharge, patient's prognosis is extremely grim, and this has been relayed to the patient's  daughter on multiple occasions by multiple providers. - Ethics has been consulted, following along, we appreciate insight recommendations on this difficult case  DVT prophylaxis: SCDs Code Status: Full code Family Communication: Discussed plan of care with patient's daughter extensively over the phone - greater than 55 minutes today spent explaining patient's current clinical picture, poor prognosis, multiple negative findings as above and likely worsening prognosis. Disposition Plan: Continue inpatient, tube feeds; grim prognosis - patient family is considering transfer to another facility.  Consultants:   Gastroenterology, [place]  Infectious disease  Hematology  Palliative care  Procedures: None  Subjective: No acute issues or events reported overnight, patient continues to be nonverbal, does not interact in any meaningful way, withdraws to pain, moans at regular intervals in what appears to be pain or discomfort, unable to localize.  Objective:  Vitals:   01/07/19 2053 01/07/19 2248 01/08/19 0249 01/08/19 0440  BP: (!) 94/34 (!) 111/53 98/70 139/69  Pulse: 64 90 (!) 103 (!) 105  Resp: '19 20 20 18  ' Temp: 98.2 F (36.8 C) 98.4 F (36.9 C) 98.2 F (36.8 C) 98.2 F (36.8 C)  TempSrc: Oral Oral Oral Oral  SpO2: 100% 95% 97% 97%  Weight:      Height:        Intake/Output Summary (Last 24 hours) at 01/08/2019 0752 Last data filed at 01/07/2019 1800 Gross per 24 hour  Intake 0 ml  Output --  Net 0 ml   Filed Weights   12/26/18 0500 01/03/19 0500 01/04/19 0500  Weight: 61 kg 61 kg 61 kg   Examination:  General: Gaunt appearing elderly woman, resting comfortably, when aroused/provoked patient moans unintelligibly. HEENT:  Normocephalic atraumatic.  Sclerae nonicteric, noninjected.  Extraocular movements intact bilaterally.  Oral thrush noted.  Poor dentition. Neck:  Without mass or deformity.  Trachea is midline. Lungs:  Clear to auscultate bilaterally without  rhonchi, wheeze, or rales. Heart:  Regular rate and rhythm.  Without murmurs, rubs, or gallops. Abdomen: Soft, nontender, nondistended.  Without guarding or rebound. GU: Notable Candida at labial folds Extremities: Without cyanosis, clubbing, edema, or obvious deformity. Vascular:  Dorsalis pedis and posterior tibial pulses palpable bilaterally. Skin:  Warm and dry, no erythema, no ulcerations.  Data Reviewed: I have personally reviewed following labs and imaging studies  CBC: Recent Labs  Lab 01/04/19 0134 01/05/19 0720 01/06/19 0306 01/07/19 0750 01/08/19 0336  WBC 3.0* 5.1 8.7 16.2* 14.5*  HGB 11.0* 9.8* 9.6* 11.6* 11.6*  HCT 34.5* 27.9* 27.4* 33.0* 32.2*  MCV 97.7 90.0 89.5 89.4 86.8  PLT 86* 79* 75* 88* 93*   Basic Metabolic Panel:  Recent Labs  Lab 01/04/19 0134 01/05/19 0153 01/06/19 0306 01/07/19 0750 01/08/19 0336  NA 130* 129* 130* 130* 130*  K 3.3* 3.3* 3.6 5.1 4.6  CL 112* 110 112* 111 109  CO2 13* 13* 15* 12* 16*  GLUCOSE 112* 118* 120* 104* 109*  BUN '8 11 17 21 ' 26*  CREATININE 0.75 0.60 0.76 0.90 1.06*  CALCIUM 8.1* 7.6* 7.3* 7.5* 7.7*  MG 1.5*  --   --   --   --   PHOS 2.5  --   --   --   --    GFR: Estimated Creatinine Clearance: 31.5 mL/min (A) (by C-G formula based on SCr of 1.06 mg/dL (H)).   Liver Function Tests: Recent Labs  Lab 01/04/19  0134 01/05/19 0153 01/06/19 0306 01/07/19 0750 01/08/19 0336  AST 247* 216* 184* 186* 189*  ALT 92* 79* 65* 61* 61*  ALKPHOS 165* 172* 168* 316* 444*  BILITOT 0.8 0.6 0.6 1.2 1.4*  PROT 5.3* 4.5* 3.8* 4.2* 5.0*  ALBUMIN 1.9* 1.7* 1.4* 1.6* 1.8*   Cardiac Enzymes: Recent Labs  Lab 01/05/19 0153 01/06/19 0306  CKTOTAL 229 370*  CKMB 5.6* 6.8*   CBG: Recent Labs  Lab 01/05/19 1247 01/06/19 0814 01/07/19 0935 01/07/19 1004 01/08/19 0744  GLUCAP 103* 100* 64* 124* 74    Recent Results (from the past 240 hour(s))  C difficile quick scan w PCR reflex     Status: None   Collection Time:  01/03/19  5:36 PM   Specimen: Stool  Result Value Ref Range Status   C Diff antigen NEGATIVE NEGATIVE Final   C Diff toxin NEGATIVE NEGATIVE Final   C Diff interpretation No C. difficile detected.  Final    Comment: Performed at [facility], [place] [address] [place], [place] [address]  Gastrointestinal Panel by PCR , Stool     Status: None   Collection Time: 01/03/19  5:36 PM   Specimen: Stool  Result Value Ref Range Status   Campylobacter species NOT DETECTED NOT DETECTED Final   Plesimonas shigelloides NOT DETECTED NOT DETECTED Final   Salmonella species NOT DETECTED NOT DETECTED Final   Yersinia enterocolitica NOT DETECTED NOT DETECTED Final   Vibrio species NOT DETECTED NOT DETECTED Final   Vibrio cholerae NOT DETECTED NOT DETECTED Final   Enteroaggregative E coli (EAEC) NOT DETECTED NOT DETECTED Final   Enteropathogenic E coli (EPEC) NOT DETECTED NOT DETECTED Final   Enterotoxigenic E coli (ETEC) NOT DETECTED NOT DETECTED Final   Shiga like toxin producing E coli (STEC) NOT DETECTED NOT DETECTED Final   Shigella/Enteroinvasive E coli (EIEC) NOT DETECTED NOT DETECTED Final   Cryptosporidium NOT DETECTED NOT DETECTED Final   Cyclospora cayetanensis NOT DETECTED NOT DETECTED Final   Entamoeba histolytica NOT DETECTED NOT DETECTED Final   Giardia lamblia NOT DETECTED NOT DETECTED Final   Adenovirus F40/41 NOT DETECTED NOT DETECTED Final   Astrovirus NOT DETECTED NOT DETECTED Final   Norovirus GI/GII NOT DETECTED NOT DETECTED Final   Rotavirus A NOT DETECTED NOT DETECTED Final   Sapovirus (I, II, IV, and V) NOT DETECTED NOT DETECTED Final    Comment: Performed at [facility], [address] [place], [place] [address]    Radiology Studies: Ct Abdomen Pelvis W Contrast  Result Date: 01/06/2019 CLINICAL DATA:  Abdominal pain EXAM: CT ABDOMEN AND PELVIS WITH CONTRAST TECHNIQUE: Multidetector CT imaging of the abdomen and pelvis was performed using the standard  protocol following bolus administration of intravenous contrast. CONTRAST:  154m OMNIPAQUE IOHEXOL 300 MG/ML  SOLN COMPARISON:  CT December 20, 2018 FINDINGS: Lower chest: Moderate bilateral effusions with adjacent areas of passive atelectasis. Mild airways thickening is present. Normal heart size. No pericardial effusion. Hepatobiliary: Stable 11 mm hepatic cyst. No concerning liver abnormality is seen. No calcified gallstones, gallbladder wall thickening or biliary ductal dilatation. Pancreas: Unremarkable. No pancreatic ductal dilatation or surrounding inflammatory changes. Spleen: Diminutive spleen. Otherwise unremarkable. Adrenals/Urinary Tract: Adrenal glands are unremarkable. Kidneys are normal, without renal calculi, focal lesion, or hydronephrosis. Urinary bladder is distended but otherwise unremarkable. Stomach/Bowel: Transesophageal tube tip terminates at the level of the gastric antrum. There is edematous mural thickening of much of the small bowel and more focally pronounced in the cecum  and ascending colon. Distal colon assumes a less thickened appearance. The appendix is not well visualized. Vascular/Lymphatic: Atherosclerotic plaque within the normal caliber aorta. Portal and hepatic veins are patent. Venous engorgement of the gonadal veins. No suspicious or enlarged lymph nodes in the included lymphatic chains. Reproductive: Parametrial calcifications and engorged gonadal veins, as above. Few scattered calcified pelvic fibroids within a retroverted uterus. No discernible adnexal lesions. Other: Moderate volume of free intraperitoneal ascites with diffusely increased attenuation of the mesenteric leaflets and fluid tracking and bilateral inguinal hernias. Extensive circumferential body wall edema is present as well. As well as milder edematous changes in the soft tissues of the imaged extremities. No free intraperitoneal air Musculoskeletal: Degenerative changes present in both wrists. Multilevel  degenerative changes are present in the imaged portions of the spine. Disc bulges present at L3-4 and L4-5. Vacuum disc noted at L3-4 as well. IMPRESSION: 1. Moderate volume of free intraperitoneal ascites, bilateral effusions, circumferential body wall and extremity edema compatible with anasarca. 2. Edematous mural thickening of much of the small bowel, more focally pronounced in the cecum and ascending colon, possibly reflective of the patient's fluid status though cannot exclude underlying enterocolitis. Correlate with symptoms. 3. Venous engorgement of the gonadal veins, which can be seen with pelvic congestion syndrome. 4. Retroverted fibroid uterus. 5. Bilateral fluid-filled inguinal hernias 6. Aortic Atherosclerosis (ICD10-I70.0). Electronically Signed   By: Lovena Le M.D.   On: 01/06/2019 21:13    Scheduled Meds:  acetaminophen  1,000 mg Per Tube Q6H   aspirin  81 mg Per Tube Daily   cephALEXin  500 mg Per Tube B55H   folic acid  1 mg Per Tube Daily   Gerhardt's butt cream   Topical TID   latanoprost  1 drop Both Eyes QHS   megestrol  400 mg Per Tube Daily   mirtazapine  30 mg Per NG tube QHS   multivitamin  15 mL Oral Daily   nystatin  5 mL Oral QID   nystatin   Topical TID   pantoprazole sodium  40 mg Per Tube Daily   sodium chloride flush  3 mL Intravenous Q12H   vancomycin  125 mg Per Tube Q12H   Continuous Infusions:  sodium chloride     dextrose 5 % and 0.45% NaCl 10 mL/hr at 01/05/19 1700   feeding supplement (OSMOLITE 1.2 CAL) 1,000 mL (01/07/19 2346)     LOS: 19 days   Time spent: >30  Little Ishikawa, DO [facility] Pager [phone]  If 7PM-7AM, please contact night-coverage [email or web address] Password [facility] 01/08/2019, 7:52 AM

## 2019-01-09 DIAGNOSIS — R945 Abnormal results of liver function studies: Secondary | ICD-10-CM | POA: Diagnosis not present

## 2019-01-09 DIAGNOSIS — K831 Obstruction of bile duct: Secondary | ICD-10-CM

## 2019-01-09 DIAGNOSIS — R627 Adult failure to thrive: Secondary | ICD-10-CM | POA: Diagnosis not present

## 2019-01-09 LAB — CBC
HCT: 33.1 % — ABNORMAL LOW (ref 36.0–46.0)
Hemoglobin: 12.2 g/dL (ref 12.0–15.0)
MCH: 31.5 pg (ref 26.0–34.0)
MCHC: 36.9 g/dL — ABNORMAL HIGH (ref 30.0–36.0)
MCV: 85.5 fL (ref 80.0–100.0)
Platelets: 68 10*3/uL — ABNORMAL LOW (ref 150–400)
RBC: 3.87 MIL/uL (ref 3.87–5.11)
RDW: 15.5 % (ref 11.5–15.5)
WBC: 11.8 10*3/uL — ABNORMAL HIGH (ref 4.0–10.5)
nRBC: 0.6 % — ABNORMAL HIGH (ref 0.0–0.2)

## 2019-01-09 LAB — COMPREHENSIVE METABOLIC PANEL
ALT: 53 U/L — ABNORMAL HIGH (ref 0–44)
AST: 175 U/L — ABNORMAL HIGH (ref 15–41)
Albumin: 1.8 g/dL — ABNORMAL LOW (ref 3.5–5.0)
Alkaline Phosphatase: 555 U/L — ABNORMAL HIGH (ref 38–126)
Anion gap: 5 (ref 5–15)
BUN: 28 mg/dL — ABNORMAL HIGH (ref 8–23)
CO2: 17 mmol/L — ABNORMAL LOW (ref 22–32)
Calcium: 8 mg/dL — ABNORMAL LOW (ref 8.9–10.3)
Chloride: 112 mmol/L — ABNORMAL HIGH (ref 98–111)
Creatinine, Ser: 1.04 mg/dL — ABNORMAL HIGH (ref 0.44–1.00)
GFR calc Af Amer: 56 mL/min — ABNORMAL LOW (ref >60)
GFR calc non Af Amer: 49 mL/min — ABNORMAL LOW (ref >60)
Glucose, Bld: 100 mg/dL — ABNORMAL HIGH (ref 70–99)
Potassium: 4.9 mmol/L (ref 3.5–5.1)
Sodium: 134 mmol/L — ABNORMAL LOW (ref 135–145)
Total Bilirubin: 1.7 mg/dL — ABNORMAL HIGH (ref 0.3–1.2)
Total Protein: 5.3 g/dL — ABNORMAL LOW (ref 6.5–8.1)

## 2019-01-09 LAB — GAMMA GT: GGT: 383 U/L — ABNORMAL HIGH (ref 7–50)

## 2019-01-09 LAB — CK: Total CK: 150 U/L (ref 38–234)

## 2019-01-09 LAB — GLUCOSE, CAPILLARY: Glucose-Capillary: 96 mg/dL (ref 70–99)

## 2019-01-09 NOTE — Progress Notes (Signed)
Progress Note   Subjective  Patient evaluated at 845 this morning.  No evidence of further fevers.  Her leukocytosis is improving as of this morning.  Liver biochemical tests have been reviewed with evidence of a rising alkaline phosphatase over the course the last 3 days.  Bilirubin slightly elevated today to 1.7.  Will order some additional lab including CPK and GGT.  Patient unable to be a part of any conversation.   Objective  Vital signs in last 24 hours: Temp:  [97.9 F (36.6 C)-98.5 F (36.9 C)] 97.9 F (36.6 C) (09/14 2043) Pulse Rate:  [93-112] 102 (09/14 2043) Resp:  [17-20] 17 (09/14 2043) BP: (99-133)/(49-98) 131/72 (09/14 2043) SpO2:  [98 %-100 %] 100 % (09/14 2043) Last BM Date: 01/09/19 General: In no acute distress this morning, core track in place Heart: Without rubs or gallops Lungs: No adventitious breath sounds present Abdomen: Soft, protuberant, normal active bowel sounds present, light palpation elicits no discomfort today, deep stethoscope palpation elicits discomfort throughout the abdomen, no rebound, no volitional guarding, abdominal anasarca present upon palpation Extremities: Lower extremity edema present bilaterally   Intake/Output from previous day: 09/13 0701 - 09/14 0700 In: 450 [NG/GT:450] Out: 575 [Urine:575] Intake/Output this shift: No intake/output data recorded.  Lab Results: Recent Labs    01/07/19 0750 01/08/19 0336 01/09/19 0435  WBC 16.2* 14.5* 11.8*  HGB 11.6* 11.6* 12.2  HCT 33.0* 32.2* 33.1*  PLT 88* 93* 68*   BMET Recent Labs    01/07/19 0750 01/08/19 0336 01/09/19 0435  NA 130* 130* 134*  K 5.1 4.6 4.9  CL 111 109 112*  CO2 12* 16* 17*  GLUCOSE 104* 109* 100*  BUN 21 26* 28*  CREATININE 0.90 1.06* 1.04*  CALCIUM 7.5* 7.7* 8.0*   LFT Recent Labs    01/09/19 0435  PROT 5.3*  ALBUMIN 1.8*  AST 175*  ALT 53*  ALKPHOS 555*  BILITOT 1.7*   PT/INR No results for input(s): LABPROT, INR in the last 72  hours.  Studies/Results: No results found.     Assessment / Plan:   This is a 85 y.o. female with pmh significant for arthritis, glaucoma, hyperlipidemia, osteoporosis, PVD, prior C. difficile infection status post FMT who was admitted with failure to thrive and liver biochemical tests abnormalities.  The difficult situation overall, after review of the patient's chart and my previous partners including Dr. Fuller Plan and Dr. Tarri Glenn and Dr. Havery Moros (who gave me signout yesterday evening) it is not clear that I have an underlying ocular razor as is the patient's issues.  Her most recent set of laboratories in regards to her liver biochemical test pattern suggests cholestasis developing.  I query of these are antibiotic related.  We will send a GGT and CPK to further evaluate things.  Liver biopsy has been previously discussed with the patient's daughter and this was not pursued.  Endoscopic evaluation as well to evaluate the abnormality seen on CT scan were offered but not felt to be high wart and potentially higher risk.  I think there is an underlying infectious process in the last few days which is evidenced by improvement in her labs improving.  Hopeful that things continue to move in the right direction.  Unfortunately her clinical situation is such that she has become severely anasarca.  As result of her hypoalbuminemia.  Not sure that feeding will be enough for this patient to optimize her.  Agree with the primary service continuing to work with  the patient's daughter as best as possible to try and define the overall goals for the patient.  A paracentesis could be considered but has been deferred as well.  At this point I am not sure what else we have to offer if the patient is not being a part of the clinical scenario and if we are potentially putting her at risk of complications by pursuing different therapies.  I will see how her labs progress in the course of the coming days.  Would be happy to  try to be a part of multidisciplinary conference if I am available based on inpatient GI needs.  I was unable to meet the patient's daughter this morning at time of patient evaluation.  We will periodically check in with the primary service this week.  For now continue PPI therapy.  Please call if any new questions.  We will follow peripherally.  Justice Britain, MD [facility] # [phone]

## 2019-01-09 NOTE — Plan of Care (Signed)
  Problem: Pain Managment: Goal: General experience of comfort will improve Outcome: Progressing   

## 2019-01-09 NOTE — TOC Progression Note (Signed)
Transition of Care [facility]) - Progression Note    Patient Details  Name: Sydney [name] MRN: [id number] Date of Birth: Dec 07, 1930  Transition of Care [facility]) CM/SW [place], [place] Phone Number: 01/09/2019, 1:47 PM  Clinical Narrative:    CSW received call from Assistant Director [name] regarding resources for pt advocacy. Spoke with Darrick Penna with Patient Experience as well to better understand pt daughter request. Will discuss with [place] and TOC Supervisor Barbette Or for any potential community resources.    Expected Discharge Plan: [place]    Expected Discharge Plan and Services Expected Discharge Plan: [place] Choice: [place] arrangements for the past 2 months: Single Family Home DME Arranged: N/A HH Arranged: PT, OT, RN [place] Agency: [facility]) Date HH Agency Contacted: 12/21/18 [place]: [place] Representative spoke with at [place]: [place] ([place]) Interventions    Readmission Risk Interventions No flowsheet data found.

## 2019-01-09 NOTE — Ethics Note (Signed)
I was asked by nursing staff on 01/04/2021 review this note with regards to appropriateness of care in addition to whether the patient's care constituents one of medical futility.  I have discussed extensively with the attending physician Dr. Holli Humbles the course of care current treatments in addition to clinical course and clinical decline.---I have additionally looked over salient clinical data.  I do think Medical staff has tried to understand and reasoinably compromise in terms of decisions regarding treatment that is acceptable to both Healthcare proxy and Medical staff  Per our [place] futility policy that I shared with Dr. Avon Gully,  If there continues to be dissent amongst healthcare proxy and medical staff that specific interventions [Feeding tubes/Artifical nutrition] will not change overall poor outcome, and re-courses including palliative care have not yielded some agreement amongst above parties, it can be argued that these interventions are futile.  -->specifically, we always offer CARE and always will care, but interventions not thought to be meaningful/effective and are felt to be FUTILE by medical staff are liable to be discontinued  I am available to meet with the patient's daughter Mrs. [name] later this week if there is a need, and will be in contact with Care team to coordinate and facilitate this process  Please let me know how else I can help-hank you for the opportunity to be involved in this process  Verneita Griffes, MD Ethics

## 2019-01-09 NOTE — Progress Notes (Signed)
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 CT of the abdomen and pelvis was negative for acute intra-abdominal or pelvic abnormality. She was found to have mildly elevated LFTs along with hyponatremia and pancytopenia. She was given IV fluids. ID and GI were consulted.  Palliative care was subsequently consulted for goals of care discussion.  Patient has remained inpatient for nearly 3 weeks with worsening status daily. Patient's mental status remains quite poor, today is the first day in nearly a week the patient has been alert or awake other than moaning/calling out in pain. Patient does have ongoing NG tube with medications and food per daughter's wishes.  Patient does remain full code despite multiple discussions of patient's worsening condition, palliative care has been consulted now 3 times with multiple lengthy sessions with the daughter about patient's worsening condition.  Patient has had extensive work-up as below including imaging, labs all of which essentially have been generally unremarkable for overt cause of patient's mental and physical decline. Patient recently treated for questionable UTI given febrile events and mental status changes in hopes that this will improve her status although very minimal if any improvement, patient continues to have markedly poor p.o. intake and likely concurrent depression ongoing. Patient is an advanced age, appears severely malnourished at this point -not consistent with any acute dietary changes but appears to be more chronic, continues to refuse medications and management although  daughter continues to request ongoing treatment, multiple consults as well as ongoing evaluation despite extensive negative work-up as below. Per discussion with palliative care as well as GI it appears the patient's daughter is unfortunately unable to cope with the fact that her mother is actively dying. We continue to follow along closely, offer what support we can, ethics has been consulted given inappropriate requests by daughter for consults and treatment which border on futile (requesting at one point for obgyn to evaluate for possible exam to rule out vaginal cancer which we discussed wouldn't be tolerated - patient then asked for blood work to rule out GU cancers -which certainly would not have any meaningful impact on her mental status).  Additionally patient continues to be somewhat agitated, unclear if patient is in pain or simply agitated due to her worsening mental status, but daughter is requesting we hold off on any further pain medication or anxiolytics.  We would recommend to focus on comfort measures and comfort care at this time, but daughter continues to request further workup.  I am doubtful that any further imaging, lab work or procedures at this time will change our trajectory.  We unfortunately do not have a working diagnosis for patient's decline other than markedly poor p.o. intake in the setting of likely depression other than possible UTI given recent fever and patient's mental status changes. Patient's daughter now requesting all physicians involved in her mother's care to be at a meeting in the next few days to help provide her with information about current care plan. Patient's daughter also fixated on single dose of 0.60m lorazepam and 0.587mof morphine on night shift on 01/04/19 and 01/05/19 respectively that she believes has caused her mother's current nonverbal condition despite our explanation  that this medication would have likely cleared within a few hours of administration.  At  this time patient's outlook remains grim, patient remains a full code despite discussion with family.  Patient's mental status remains remarkably poor, patient now completed antibiotic coverage for questionable UTI.  Recent abdominal imaging remarkable for ascites only, not surprising given patient's very low albumin level again in the setting of poor p.o. intake.  Patient remains high risk for worsening mental status, aspiration, elevated risk of morbidity mortality given advanced age, chronic comorbid conditions and above.   Assessment & Plan:   Principal Problem:   Adult failure to thrive Active Problems:   Pancytopenia (HCC)   Hyponatremia   Protein calorie malnutrition (HCC)   History of TIA (transient ischemic attack)   Generalized weakness   Pressure injury of skin   Transaminitis   Malnutrition of moderate degree   Evaluation by psychiatric service required   Failure to thrive in an adult, severe protein caloric malnutrition, hypoalbuminemia. POA, worsening - Patient now representing to the hospital with weakness, and failure to thrive with decreased oral intake. - CT head negative for acute cranial abnormality - MR brain no evidence of acute stroke - Unclear etiology, but there was thought to be a component of depression and psychiatry was consulted with recommendations of initiation of antidepressant therapy, but patient nor daughter interested at this time - PT/OT recommends SNF, patient/daughter declined - Continue to encourage increased oral intake; patient with very little oral intake during hospitalization - Continue fluids/coretrak tube feeds - Continue Megace/Remeron for appetite stimulation - NG tube placement 9/5 unsuccessful with multiple attempts - Coretrak placement 01/03/2019; starting tube feeds - Overall prognosis has been discussed by multiple providers which continues to be grim. - Palliative care consultation evaluation appreciated. - Despite aggressive  measures, patient's condition continues to be quite poor - Despite multiple conversations with her daughter, who continues to request aggressive measures as she states her mother's only wishes are "to live"; patient previously refusing all intervention and treatment while oriented - We have high suspicion that her mother suffers from severe depression with recent loss of her sister 2 months ago with extreme ataxia and failure to thrive, although her daughter continues to think that this Link Snuffer is completely wrong  Acute metabolic encephalopathy, likely secondary to above, ongoing - Patient today was able to open her eyes to command otherwise quite limited review of systems or conversational but seems to be at least oriented to name - Poorly interactive, unable to follow commands - At this time unclear etiology other than poor p.o. intake and failure to thrive. - Patient did receive a small dose lorazepam/morphine overnight on 9/9 and 9/10 but otherwise polypharmacy is unlikely  ?UTI given encephalopathy and fever, status post treatment Yeast infection, groin/oral thrush - Unable to assess for symptoms given patient's mental status -no longer reaching for groin or moaning in pain - Leukocytosis downtrending currently 11, remains afebrile - Patient continues to be somewhat difficult/combative, will treat prophylactically as getting a clean urine from the patient is likely going to be acutely difficult - Treat UTI per ID following, appreciate recommendations - Nystatin topical powder to groin, nystatin oral for p.o. thrush  Elevated LFTs, improving, unclear etiology, POA Concurrent ascites - Patient presenting with notable elevated transaminitis; low albumin - Hepatitis panel negative. EBV and CMV IgM are negative.   - Right upper quadrant ultrasound only showed gallbladder sludging.   - CT abdomen/pelvis with no evidence of acute intra-abdominal or  pelvic abnormality but with diffuse fluid  within the colon suggestive of possible enteritis versus diarrheal illness. - KUB on 12/29/2018 with mild diffuse gaseous prominence without obstructive pattern. Suspicion for possible mild shock liver given she was hypotensive on presentation.   - GI also believes that this transaminitis could reflect from skeletal muscle release as well - CK, MB minimally elevated -likely secondary to prolonged bedbound status at this point - GI following, repeat CT on 01/06/2019 remarkable for ascites - AST ALT continue to be somewhat stable although elevated - Alk phos and total bilirubin continue to increase - defer to GI  Leukopenia -->now leukocytosis as above, improving Thrombocytopenia, stable - Evaluated by hematology previously - Patient has been started on vitamin B12 parenterally as she had low levels of vitamin B12 during last admission but vitamin B12 level normal this admission - Minimally improving at this point with nutrition, unlikely affected by B12 given timing - Platelets currently in the 80s, improving previously in the 70s - Outpatient follow-up with hematology. No plans for bone marrow biopsy during this admission  AKI, mild, ongoing -Despite ongoing free water flushes, tube feeds patient's creatinine continues to somewhat worsen -Likely in the setting of failure to thrive as above with marked hypo-albuminemia  Hyponatremia, likely hypovolemic, ongoing - Sodium 128 at admission likely in the setting of poor p.o. intake and dehydration - Somewhat stabilized currently with free water flushes via NG tube and tube feedings  Hypokalemia, Hypomagnesemia in the setting of poor p.o. intake - Improving, follow daily labs  History of TIA  - Patient has not received Plavix since 12/24/2018 as she was refusing at that time. - Continue aspirin; although patient refusing all medications at this time  Stage II sacral pressure injury: Present on admission - Continue wound care as per wound care  consult recommendations.  ? Episode of diarrhea, CDiff ruled out - Patient started having increased frequency of somewhat loose stool while on tube feeds, cdiff testing negative - Patient now febrile as above - fever of 102 on 01/05/2019 - Previous history of C. difficile colitis back in 2018 in which she received a fecal transplant   Odynophagia with possible oropharyngeal candidiasis - Unable to use Diflucan because of increased LFTs.  Nystatin orally has been ordered but patient refuses all her meds -will administer when able  Generalized deconditioning, POA - PT recommends SNF placement  - Daughter thinks that patient will get better and will be able to go home and declines SNF placement.  Ethics: - Patient now re-presenting after recent hospitalization, was home for roughly 1-2 days and returned with worsening weakness, severe dehydration, and adult failure to thrive.  Daughter believes that she just will require some nutrition through artificial means to get her back to her previous baseline. She states that her mother was independent with her ADLs and cooking for herself prior to admission (unclear timeframe).  Daughter states that her current decline was acute and only started on 12/28/2018; which is contrary to all nursing reports and previous provider reports during her care during this hospitalization (patient weighed 45k over a month ago).  Daughter seems to be struggling with the notion that her mother is in the active dying process in regards to her adult failure to thrive in which she is refusing most if not all interventions and has not been eating or drinking now for weeks. - Given this patient's lack response and repeat hospitalization in less than a few days from previous discharge, patient's  prognosis is extremely grim, and this has been relayed to the patient's daughter on multiple occasions by multiple providers. - Ethics has been consulted, following along, we appreciate insight  recommendations on this difficult case  DVT prophylaxis: SCDs Code Status: Full code Family Communication: Discussed plan of care with patient's daughter extensively over the phone - explaining patient's current clinical picture, poor prognosis, multiple negative findings as above. Disposition Plan: Continue inpatient, tube feeds; grim prognosis - patient family is considering transfer to another facility.  Consultants:   Gastroenterology, [place]  Infectious disease  Hematology  Palliative care  Subjective: No acute issues or events reported overnight, poorly verbal - patient does appear more calm/rested this morning. Able to open eyes to command but unclear if this was following commands vs reacting to being awoken.  Objective:  Vitals:   01/08/19 1355 01/08/19 1852 01/08/19 2132 01/09/19 0347  BP: 131/69 127/89 (!) 99/49 (!) 122/98  Pulse: 96 (!) 110 (!) 110 (!) 112  Resp: '20 20 17 17  ' Temp: 98 F (36.7 C) 98.4 F (36.9 C) 98.5 F (36.9 C) 98.5 F (36.9 C)  TempSrc: Axillary Axillary Axillary Oral  SpO2: 98% 97% 100% 100%  Weight:      Height:        Intake/Output Summary (Last 24 hours) at 01/09/2019 0751 Last data filed at 01/09/2019 0458 Gross per 24 hour  Intake 450 ml  Output 575 ml  Net -125 ml   Filed Weights   12/26/18 0500 01/03/19 0500 01/04/19 0500  Weight: 61 kg 61 kg 61 kg   Examination:  General: Gaunt appearing elderly woman, resting comfortably HEENT:  Normocephalic atraumatic.  Sclerae nonicteric, noninjected.  Extraocular movements intact bilaterally.  Oral thrush noted.  Poor dentition. Neck:  Without mass or deformity.  Trachea is midline. Lungs:  Clear to auscultate bilaterally without rhonchi, wheeze, or rales. Heart:  Regular rate and rhythm.  Without murmurs, rubs, or gallops. Abdomen: Soft, nontender, nondistended.  Without guarding or rebound. GU: Notable Candida at labial folds Extremities: Without cyanosis, clubbing, edema, or  obvious deformity. Vascular:  Dorsalis pedis and posterior tibial pulses palpable bilaterally. Skin:  Warm and dry, no erythema, stage 1-2 sacral decub - see wound documented by nurse  Data Reviewed: I have personally reviewed following labs and imaging studies  CBC: Recent Labs  Lab 01/05/19 0720 01/06/19 0306 01/07/19 0750 01/08/19 0336 01/09/19 0435  WBC 5.1 8.7 16.2* 14.5* 11.8*  HGB 9.8* 9.6* 11.6* 11.6* 12.2  HCT 27.9* 27.4* 33.0* 32.2* 33.1*  MCV 90.0 89.5 89.4 86.8 85.5  PLT 79* 75* 88* 93* 68*   Basic Metabolic Panel:  Recent Labs  Lab 01/04/19 0134 01/05/19 0153 01/06/19 0306 01/07/19 0750 01/08/19 0336 01/09/19 0435  NA 130* 129* 130* 130* 130* 134*  K 3.3* 3.3* 3.6 5.1 4.6 4.9  CL 112* 110 112* 111 109 112*  CO2 13* 13* 15* 12* 16* 17*  GLUCOSE 112* 118* 120* 104* 109* 100*  BUN '8 11 17 21 ' 26* 28*  CREATININE 0.75 0.60 0.76 0.90 1.06* 1.04*  CALCIUM 8.1* 7.6* 7.3* 7.5* 7.7* 8.0*  MG 1.5*  --   --   --   --   --   PHOS 2.5  --   --   --   --   --    GFR: Estimated Creatinine Clearance: 32.1 mL/min (A) (by C-G formula based on SCr of 1.04 mg/dL (H)).   Liver Function Tests: Recent Labs  Lab 01/05/19 0153 01/06/19  8270 01/07/19 0750 01/08/19 0336 01/09/19 0435  AST 216* 184* 186* 189* 175*  ALT 79* 65* 61* 61* 53*  ALKPHOS 172* 168* 316* 444* 555*  BILITOT 0.6 0.6 1.2 1.4* 1.7*  PROT 4.5* 3.8* 4.2* 5.0* 5.3*  ALBUMIN 1.7* 1.4* 1.6* 1.8* 1.8*   Cardiac Enzymes: Recent Labs  Lab 01/05/19 0153 01/06/19 0306  CKTOTAL 229 370*  CKMB 5.6* 6.8*   CBG: Recent Labs  Lab 01/05/19 1247 01/06/19 0814 01/07/19 0935 01/07/19 1004 01/08/19 0744  GLUCAP 103* 100* 64* 124* 74    Recent Results (from the past 240 hour(s))  C difficile quick scan w PCR reflex     Status: None   Collection Time: 01/03/19  5:36 PM   Specimen: Stool  Result Value Ref Range Status   C Diff antigen NEGATIVE NEGATIVE Final   C Diff toxin NEGATIVE NEGATIVE Final   C  Diff interpretation No C. difficile detected.  Final    Comment: Performed at [facility], [place] [address], [place], [place] [address]  Gastrointestinal Panel by PCR , Stool     Status: None   Collection Time: 01/03/19  5:36 PM   Specimen: Stool  Result Value Ref Range Status   Campylobacter species NOT DETECTED NOT DETECTED Final   Plesimonas shigelloides NOT DETECTED NOT DETECTED Final   Salmonella species NOT DETECTED NOT DETECTED Final   Yersinia enterocolitica NOT DETECTED NOT DETECTED Final   Vibrio species NOT DETECTED NOT DETECTED Final   Vibrio cholerae NOT DETECTED NOT DETECTED Final   Enteroaggregative E coli (EAEC) NOT DETECTED NOT DETECTED Final   Enteropathogenic E coli (EPEC) NOT DETECTED NOT DETECTED Final   Enterotoxigenic E coli (ETEC) NOT DETECTED NOT DETECTED Final   Shiga like toxin producing E coli (STEC) NOT DETECTED NOT DETECTED Final   Shigella/Enteroinvasive E coli (EIEC) NOT DETECTED NOT DETECTED Final   Cryptosporidium NOT DETECTED NOT DETECTED Final   Cyclospora cayetanensis NOT DETECTED NOT DETECTED Final   Entamoeba histolytica NOT DETECTED NOT DETECTED Final   Giardia lamblia NOT DETECTED NOT DETECTED Final   Adenovirus F40/41 NOT DETECTED NOT DETECTED Final   Astrovirus NOT DETECTED NOT DETECTED Final   Norovirus GI/GII NOT DETECTED NOT DETECTED Final   Rotavirus A NOT DETECTED NOT DETECTED Final   Sapovirus (I, II, IV, and V) NOT DETECTED NOT DETECTED Final    Comment: Performed at [facility], [address] [place], [place] [address]    Radiology Studies: No results found.  Scheduled Meds: . acetaminophen  1,000 mg Per Tube Q6H  . aspirin  81 mg Per Tube Daily  . folic acid  1 mg Per Tube Daily  . Gerhardt's butt cream   Topical TID  . latanoprost  1 drop Both Eyes QHS  . megestrol  400 mg Per Tube Daily  . mirtazapine  30 mg Per NG tube QHS  . multivitamin  15 mL Oral Daily  . nystatin  5 mL Oral QID  . nystatin   Topical  TID  . pantoprazole sodium  40 mg Per Tube Daily  . sodium chloride flush  3 mL Intravenous Q12H   Continuous Infusions: . sodium chloride    . dextrose 5 % and 0.45% NaCl 10 mL/hr at 01/05/19 1700  . feeding supplement (OSMOLITE 1.2 CAL) 1,000 mL (01/08/19 2127)     LOS: 20 days   Time spent: [place], DO [facility] Pager [phone]  If 7PM-7AM, please contact  night-coverage [email or web address] Password [facility] 01/09/2019, 7:51 AM

## 2019-01-09 NOTE — Progress Notes (Addendum)
    [place] for Infectious Disease  Date of Admission:  12/19/2018     Total days of antibiotics 4         BRIEF PROGRESS NOTE  Ms. Isherwood has remained afebrile and has improving leukocytosis. She has now completed antimicrobial therapy for suspected UTI and C. Diff testing is negative. Episode of diarrhea appears to have started with initiation of tube feeds. There does not appear to be any further evidence of infection and will discontinue antibiotic therapy at this time and monitor.    Principal Problem:   Adult failure to thrive Active Problems:   Pancytopenia (HCC)   Hyponatremia   Protein calorie malnutrition (HCC)   History of TIA (transient ischemic attack)   Generalized weakness   Pressure injury of skin   Transaminitis   Malnutrition of moderate degree   Evaluation by psychiatric service required  Attestation: Pt's MS status changes are more likely due to typical waxing and waning of dementia rather than secondary to a UTI. TFs will lead to liquid bowel movements chronically, so best strategy for avoid recurrent CDI episodes would be avoidance of any empiric ABX. No evidence of active infection is present, so will limit PO vancomycin to 1 day of tx following completion of systemic ABX given prophylactically. Will sign off at this time.  Catalina Antigua, MD  Terri Piedra, NP [place] [facility] [phone] Pager  01/09/2019  4:16 PM

## 2019-01-09 NOTE — Discharge Planning (Signed)
Occupational Therapy Discharge Patient Details Name: Sydney [name] MRN: [id number] DOB: 01-Feb-1931 Today's Date: 01/09/2019     Patient discharged from OT services secondary to medical decline, poor prognosis, inability to participate.  Please see latest therapy progress note for current level of functioning and progress toward goals.    Progress and discharge plan discussed with patient and/or caregiver: family not available, per chart, daughter struggling with her mother's decline.        Malka So 01/09/2019, 8:22 AM  Nestor Lewandowsky, OTR/L Acute Rehabilitation Services Pager: [phone] Office: [phone]

## 2019-01-09 NOTE — Progress Notes (Signed)
   01/09/19 0800  OT Visit Information  Last OT Received On 01/09/19  Assistance Needed +2  History of Present Illness Sydney [name] is a 85 y.o. 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  Precautions  Precautions Fall  Pain Assessment  Pain Assessment Faces  Faces Pain Scale 6  Pain Location generalized  Pain Descriptors / Indicators Moaning  Pain Intervention(s) Monitored during session;Repositioned  Cognition  Arousal/Alertness Lethargic  Behavior During Therapy Restless;Agitated;Flat affect  Overall Cognitive Status Impaired/Different from baseline  General Comments pt not communicative, not responding to commands  ADL  General ADL Comments requiring total assist  Bed Mobility  Overal bed mobility Needs Assistance  Bed Mobility Rolling  General bed mobility comments total assist  Transfers  General transfer comment unable  OT - End of Session  Patient left in bed;with call bell/phone within reach  OT Assessment/Plan  OT Plan Discharge plan remains appropriate  OT Visit Diagnosis Muscle weakness (generalized) (M62.81);Unsteadiness on feet (R26.81);Other symptoms and signs involving cognitive function  Follow Up Recommendations SNF;Supervision/Assistance - 24 hour  AM-PAC OT "6 Clicks" Daily Activity Outcome Measure (Version 2)  Help from another person eating meals? 1  Help from another person taking care of personal grooming? 1  Help from another person toileting, which includes using toliet, bedpan, or urinal? 1  Help from another person bathing (including washing, rinsing, drying)? 1  Help from another person to put on and taking off regular upper body clothing? 1  Help from another person to put on and  taking off regular lower body clothing? 1  6 Click Score 6  OT Goal Progression  Progress towards OT goals Not progressing toward goals - comment (pt with medical decline)  OT Time Calculation  OT Start Time (ACUTE ONLY) 0800  OT Stop Time (ACUTE ONLY) 0810  OT Time Calculation (min) 10 min  OT General Charges  $OT Visit 1 Visit  OT Treatments  $Therapeutic Activity 8-22 mins  Nestor Lewandowsky, OTR/L [place] Pager: [phone] Office: [phone]

## 2019-01-10 DIAGNOSIS — R627 Adult failure to thrive: Secondary | ICD-10-CM | POA: Diagnosis not present

## 2019-01-10 DIAGNOSIS — R945 Abnormal results of liver function studies: Secondary | ICD-10-CM | POA: Diagnosis not present

## 2019-01-10 DIAGNOSIS — K831 Obstruction of bile duct: Secondary | ICD-10-CM | POA: Diagnosis not present

## 2019-01-10 LAB — COMPREHENSIVE METABOLIC PANEL
ALT: 44 U/L (ref 0–44)
AST: 147 U/L — ABNORMAL HIGH (ref 15–41)
Albumin: 1.6 g/dL — ABNORMAL LOW (ref 3.5–5.0)
Alkaline Phosphatase: 615 U/L — ABNORMAL HIGH (ref 38–126)
Anion gap: 7 (ref 5–15)
BUN: 29 mg/dL — ABNORMAL HIGH (ref 8–23)
CO2: 16 mmol/L — ABNORMAL LOW (ref 22–32)
Calcium: 8.1 mg/dL — ABNORMAL LOW (ref 8.9–10.3)
Chloride: 114 mmol/L — ABNORMAL HIGH (ref 98–111)
Creatinine, Ser: 0.91 mg/dL (ref 0.44–1.00)
GFR calc Af Amer: >60 mL/min (ref >60)
GFR calc non Af Amer: 57 mL/min — ABNORMAL LOW (ref >60)
Glucose, Bld: 108 mg/dL — ABNORMAL HIGH (ref 70–99)
Potassium: 5.1 mmol/L (ref 3.5–5.1)
Sodium: 137 mmol/L (ref 135–145)
Total Bilirubin: 1.2 mg/dL (ref 0.3–1.2)
Total Protein: 5.1 g/dL — ABNORMAL LOW (ref 6.5–8.1)

## 2019-01-10 LAB — GLUCOSE, CAPILLARY: Glucose-Capillary: 96 mg/dL (ref 70–99)

## 2019-01-10 LAB — CBC
HCT: 30.7 % — ABNORMAL LOW (ref 36.0–46.0)
Hemoglobin: 10.9 g/dL — ABNORMAL LOW (ref 12.0–15.0)
MCH: 31.4 pg (ref 26.0–34.0)
MCHC: 35.5 g/dL (ref 30.0–36.0)
MCV: 88.5 fL (ref 80.0–100.0)
Platelets: 76 10*3/uL — ABNORMAL LOW (ref 150–400)
RBC: 3.47 MIL/uL — ABNORMAL LOW (ref 3.87–5.11)
RDW: 16.1 % — ABNORMAL HIGH (ref 11.5–15.5)
WBC: 7.7 10*3/uL (ref 4.0–10.5)
nRBC: 1.3 % — ABNORMAL HIGH (ref 0.0–0.2)

## 2019-01-10 LAB — PROTIME-INR
INR: 1 (ref 0.8–1.2)
Prothrombin Time: 12.6 seconds (ref 11.4–15.2)

## 2019-01-10 MED ORDER — CHLORHEXIDINE GLUCONATE 0.12 % MT SOLN
15.0000 mL | Freq: Two times a day (BID) | OROMUCOSAL | Status: DC
  Administered 2019-01-11 – 2019-01-29 (×28): 15 mL via OROMUCOSAL
  Filled 2019-01-10 (×33): qty 15

## 2019-01-10 MED ORDER — ORAL CARE MOUTH RINSE
15.0000 mL | Freq: Two times a day (BID) | OROMUCOSAL | Status: DC
  Administered 2019-01-12 – 2019-01-27 (×14): 15 mL via OROMUCOSAL

## 2019-01-10 NOTE — TOC Progression Note (Addendum)
Transition of Care [facility]) - Progression Note    Patient Details  Name: Sydney [name] MRN: [id number] Date of Birth: [birth year]  Transition of [facility] CM/SW [place], [place] Phone Number: 01/10/2019, 10:23 AM  Clinical Narrative:    After discussion with TOC AD and TOC Supervisor there are no current appropriate community resources to serve as "pt advocate". Spoke with [name], Comptroller to encourage staff to offer for family pastor or other support to be invited to meeting should pt daughter desire support.   TOC team remains available once disposition has been clarified.   Expected Discharge Plan: [place]    Expected Discharge Plan and Services Expected Discharge Plan: [place] Choice: [place] arrangements for the past 2 months: Single Family Home            DME Arranged: N/A HH Arranged: PT, OT, RN [place] Agency: [place] ([facility]) Date HH Agency Contacted: 12/21/18 Time [place]: [place] Representative spoke with at [place]: [place] ([place]) Interventions    Readmission Risk Interventions No flowsheet data found.

## 2019-01-10 NOTE — Progress Notes (Addendum)
Progress Note   Subjective  I have reviewed the patient's medical record this morning.  Liver transferases and bilirubin have down trended.  Alkaline phosphatase continues to rise into the 600s.  The patient's white count was reviewed and is down trended nicely without any evidence of any fevers.  Patient remains tachycardic at times but is normotensive otherwise.  [name] was not present at time of evaluation of the patient this morning.  Patient on exam today is unable to communicate or give any salient insight into her current state.  Primary medical service at patient's bedside as well.   Objective  Vital signs in last 24 hours: Temp:  [97.9 F (36.6 C)-98.3 F (36.8 C)] 97.9 F (36.6 C) (09/14 2043) Pulse Rate:  [93-115] 115 (09/15 0500) Resp:  [16-20] 16 (09/15 0500) BP: (113-147)/(57-89) 147/66 (09/15 0500) SpO2:  [98 %-100 %] 100 % (09/14 2043) Last BM Date: 01/09/19 General: Resting in bed with Cortrak in place Heart: Tachycardic Lungs: No wheezing present Abdomen: Soft, protuberant, no discomfort upon palpation  Extremities: Lower extremity edema present bilaterally   Intake/Output from previous day: 09/14 0701 - 09/15 0700 In: 503 [P.O.:5; NG/GT:498] Out: 1100 [Urine:1100] Intake/Output this shift: No intake/output data recorded.  Lab Results: Recent Labs    01/08/19 0336 01/09/19 0435 01/10/19 0114  WBC 14.5* 11.8* 7.7  HGB 11.6* 12.2 10.9*  HCT 32.2* 33.1* 30.7*  PLT 93* 68* 76*   BMET Recent Labs    01/08/19 0336 01/09/19 0435 01/10/19 0114  NA 130* 134* 137  K 4.6 4.9 5.1  CL 109 112* 114*  CO2 16* 17* 16*  GLUCOSE 109* 100* 108*  BUN 26* 28* 29*  CREATININE 1.06* 1.04* 0.91  CALCIUM 7.7* 8.0* 8.1*   LFT Recent Labs    01/10/19 0114  PROT 5.1*  ALBUMIN 1.6*  AST 147*  ALT 44  ALKPHOS 615*  BILITOT 1.2   PT/INR Recent Labs    01/10/19 0844  LABPROT 12.6  INR 1.0    Studies/Results: No results found.      Assessment / Plan:   This is a 85 y.o. female with pmh significant for arthritis, glaucoma, hyperlipidemia, osteoporosis, PVD, prior C. difficile infection status post FMT who was admitted with failure to thrive and liver biochemical tests abnormalities.  There has been no significant clinical change from yesterday into today. The patient's liver biochemical profile continues to make some movements, her transferases and bilirubin have down trended.  Her cholestasis continues to be present with a slight rise in alkaline phosphatase. CPK reviewed from yesterday shows no significant progression with the possibility of prior concern for myositis. I will add on an INR today to see how synthetic function is doing from a liver perspective as it has not been checked in a few weeks. I have reviewed her medication administration record.  The most recent medications that were initiated around the time of her increase in alkaline phosphatase were Megace, Remeron, Keflex, p.o. vancomycin.  Based on evaluation and review from liver tox, Megace is not noted to be a cause of significant liver test abnormalities.  Remeron has been associated with certain patients developing fevers and drug rashes with peripheral eosinophilia and slight transferase elevation rather than cholestasis.  Oral cephalosporins are more often seen in approximately up to 11% of patients to have a cholestatic injury pattern.  P.o. vancomycin has not been associated with significant liver toxicity based on data and liver tox. I suspect that  her rise in alkaline phosphatase is likely a result of the recent initiation of Keflex which has now been stopped.  The cholestasis could also be a result of recent septic physiology and could be inflammatory driven as well.  Liver test abnormalities for a drug-induced liver injury can take days or weeks or months to improve at times.  Her last dose of Keflex was on the 13th.  We will have to see how things go now  that she is off of it. Previously my partners had offered the patient and daughter a liver MRICP as well as potentially a liver biopsy. We could always consider a repeat attempt at a MRI/MRCP to evaluate the biliary tree to ensure nothing has changed.  One could also consider a liver ultrasound to see whether biliary ductal dilation has progressed or worsened.  Hard with the patient's current clinical status for her to be able to be able to perform the test accurately as one has to hold their breath to obtain appropriate visualization. A liver biopsy may show drug-induced liver injury but would never showed the type of drug that may have caused issues and it could just show inflammation nonspecifically as a result of sample error or he could show nothing at all..  In the setting of her known ascites there are complications that could arise from a liver biopsy as well and thus it is not completely clear how much we would gather from here.  After having reviewed the extensive laboratory/serologic work-up that has been completed over the last 3 weeks by my previous partners I am unable to come up with a unifying diagnosis unfortunately. Previous consideration for an endoscopy and colonoscopy for evaluation of enteritis and colitis picture had been entertained but on previous discussion by my partners with the patient's daughter it was felt that this would not be in the patient's own desires and thus this was deferred.  I also agreed that the overall yield of these procedures would likely be low and put the patient at risk of anesthesia effects as well as potential complications.  Her age and comorbidities increase her risk for complications from endoscopic evaluation. As previously discussed a tap of ascites to evaluate for where this is coming from would be something that could be considered however this was deferred as she was already put on antibiotics.  This is most likely a result of her diffuse  hypoalbuminemia but a liver tap could be considered as well but had been deferred. I wish I had a unifying diagnosis for the patient and for the patient's daughter/family but unfortunately I do not.  The previous discussion as noted above has been brought up with the patient's daughter before and most of the work-up has either been declined due to risk/benefit ratio being higher for risk. I appreciate the opportunity to be part of this patient's care as my previous partners have as well.  I would be happy to try to be available for a meeting/discussion based on the timing of a potential meeting and my inpatient schedule/procedure schedule. I will update the patient's primary gastroenterologist, Dr. Carlean Purl as well later this week.   Please call if any new questions.    Justice Britain, MD [facility] # [id number]   PM Addendum This afternoon I had the opportunity to talk with the patient's daughter for approximately 25 minutes on the phone.  We went over the patient's clinical course from her admission to where she is now.  We went over the GI team use work-up in regards to the liver biochemical tests profile changes.  Unfortunately also described the we do not have a unifying diagnosis.  Discussed that procedures such as EGD and colonoscopy carry risks that were previously discussed with her by my partners and it was felt that the risks outweigh the potential benefits of what would be found.  Liver biopsy had also been discussed in the past and felt that information could be gathered but it may not change the overall clinical picture for the patient and that was held off.  She asks about a rectal tube being placed and I felt that unless she was having such significant amounts of diarrhea and could not be cleaned effectively on a scheduled basis I would try to minimize putting when in due to the risks associated with rectal tubes.  I had an opportunity to  speak with Dr. Carlean Purl as well and update him.  I spoke with Dr. Verlon Au and to Dr. Hulen Skains as well this evening.  I will be happy to try to be available for a meeting however due to the inpatient clinical situation I may not be able to be present but I had an opportunity to discuss my concerns.

## 2019-01-10 NOTE — Progress Notes (Signed)
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 CT of the abdomen and pelvis was negative for acute intra-abdominal or pelvic abnormality. She was found to have mildly elevated LFTs along with hyponatremia and pancytopenia. She was given IV fluids. ID and GI were consulted. Palliative care was subsequently consulted for goals of care discussion.  Over the past 48 hours patient's mental status has minimally improved, over the previous week patient has only able to moan/calling out in pain.  Patient now able to shake her head yes or no to simple yes/no questions which seems to be appropriate, otherwise verbal status continues to be limited to week 1 word answers which are quite unintelligible for the most part. Patient does have ongoing NG tube with medications and food per daughter's wishes. Patient has had extensive work-up as below including imaging, labs all of which essentially have been generally unremarkable for overt cause of patient's mental and physical decline. Patient recently treated for questionable UTI given febrile events and mental status changes in hopes that this will improve her status although very minimal if any improvement, patient continues to have markedly poor p.o. intake and likely concurrent depression ongoing. Patient is an advanced age, appears severely malnourished at this point -not consistent with any acute dietary changes but appears to be more chronic, previously refusing all medications and management although daughter continues to request ongoing treatment, multiple consults as well as ongoing  evaluation despite extensive negative work-up as below. Per discussion with palliative care as well as GI it appears the patient's daughter is unfortunately unable to cope with the fact that her mother is actively dying. We continue to follow along closely, offer what support we can, ethics has been consulted given inappropriate requests by daughter for consults and treatment which in my opinion would be futile (requesting at one point for obgyn to evaluate for possible exam to rule out vaginal cancer which we discussed wouldn't likely be tolerated - patient then asked for blood work to rule out GU cancers -which certainly would not have any meaningful impact on her current disposition).  Additionally patient continues to be somewhat agitated, unclear if patient is in pain or simply agitated due to her worsening mental status, but daughter is requesting we hold off on any further pain medication or anxiolytics.  We continue to recommend to focus on comfort measures and comfort care at this time, but daughter continues to request further workup  I am doubtful that any further imaging, lab work or procedures at this time will change our trajectory.  We unfortunately do not have a working diagnosis for patient's decline other than markedly poor p.o. intake in the setting of likely depression other than possible UTI given recent fever and patient's mental status changes, but have now been treated appropriately with minimal change in patient's status.  Patient's daughter also fixated on single dose of 0.47m lorazepam and 0.539mof morphine on night shift on 01/04/19 and 01/05/19 respectively that she believes has caused her mother's current nonverbal condition despite our explanation that this medication would have likely cleared within a few hours of administration.  At this time patient's outlook remains grim, patient remains  a full code despite discussion with family.  Patient's mental status remains remarkably poor,  patient now completed antibiotic coverage for questionable UTI.  Recent repeat abdominal imaging remarkable for ascites only, not surprising given patient's very low albumin level again in the setting of poor p.o. intake.  Patient remains high risk for worsening mental status, aspiration, elevated risk of morbidity mortality given advanced age, chronic comorbid conditions and above.  Assessment & Plan:   Principal Problem:   Adult failure to thrive Active Problems:   Pancytopenia (HCC)   Hyponatremia   Protein calorie malnutrition (HCC)   History of TIA (transient ischemic attack)   Generalized weakness   Pressure injury of skin   Transaminitis   Malnutrition of moderate degree   Evaluation by psychiatric service required   Failure to thrive in an adult, severe protein caloric malnutrition, hypoalbuminemia. POA, worsening - Patient now representing to the hospital with weakness, and failure to thrive with decreased oral intake. - CT head negative for acute cranial abnormality - MR brain no evidence of acute stroke - Unclear etiology, but there was thought to be a component of depression and psychiatry was consulted with recommendations of initiation of antidepressant therapy, but patient nor daughter interested at this time - PT/OT recommends SNF, patient/daughter declined - Continue to encourage increased oral intake; patient with very little oral intake during hospitalization - Continue fluids/coretrak tube feeds - Continue Megace/Remeron for appetite stimulation - NG tube placement 9/5 unsuccessful with multiple attempts - Coretrak placement 01/03/2019; starting tube feeds - Overall prognosis has been discussed by multiple providers which continues to be grim. - Palliative care consultation evaluation appreciated. - Despite aggressive measures, patient's condition continues to be quite poor - Despite multiple conversations with her daughter, who continues to request aggressive  measures as she states her mother's only wishes are "to live"; patient previously refusing all intervention and treatment while oriented - We have high suspicion that her mother suffers from severe depression with recent loss of her sister 2 months ago with extreme ataxia and failure to thrive, although her daughter continues to think that this Link Snuffer is completely wrong  Acute metabolic encephalopathy, likely secondary to above, minimally improving - Patient able to shake her head yes or no to simple questions, seemingly appropriately although verbal status remains quite poor - Poorly interactive, unable to follow commands - At this time unclear etiology other than depression, poor p.o. intake and failure to thrive. - Patient did receive a small dose lorazepam/morphine overnight on 9/9 and 9/10 but otherwise polypharmacy/medication is unlikely  ?UTI given encephalopathy and fever, status post treatment Yeast infection, groin/oral thrush - Unable to assess for symptoms given patient's mental status -no longer reaching for groin or moaning in pain - Leukocytosis downtrending currently 11, remains afebrile - Patient continues to be somewhat difficult/combative, will treat prophylactically as getting a clean urine from the patient is likely going to be acutely difficult - Treat UTI per ID following, appreciate recommendations - Nystatin topical powder to groin, nystatin oral for p.o. thrush  Elevated LFTs, improving, unclear etiology, POA Concurrent ascites - Patient presenting with notable elevated transaminitis; low albumin - Hepatitis panel negative. EBV and CMV IgM are negative.   - Right upper quadrant ultrasound only showed gallbladder sludging.   - CT abdomen/pelvis with no evidence of acute intra-abdominal or pelvic abnormality but with diffuse fluid within the colon suggestive of possible enteritis versus diarrheal illness. - KUB on 12/29/2018 with mild diffuse gaseous prominence without  obstructive  pattern. Suspicion for possible mild shock liver given she was hypotensive on presentation.   - GI also believes that this transaminitis could reflect from skeletal muscle release as well - CK, MB minimally elevated -but improving - GI following, repeat CT on 01/06/2019 remarkable for ascites - AST ALT continue to be somewhat stable although elevated - Alk phos and total bilirubin continue to increase -likely secondary to cephalosporins per discussion with GI  Leukopenia -->now leukocytosis as above, resolved Thrombocytopenia, stable - Evaluated by hematology previously - Patient has been started on vitamin B12 parenterally as she had low levels of vitamin B12 during last admission but vitamin B12 level normal this admission - Minimally improving at this point with nutrition, unlikely affected by B12 given timing - Platelets currently in the 50-60s - No further work-up indicated in the inpatient setting, patient can follow-up in the outpatient setting if indicated  AKI, mild, resolving - Now improving, likely in the setting of failure to thrive as above with marked hypo-albuminemia  Hyponatremia, likely hypovolemic, resolved - Sodium 128 at admission likely in the setting of poor p.o. intake and dehydration - Improving with free water flushes via NG tube and tube feedings  Hypokalemia, Hypomagnesemia in the setting of poor p.o. intake - Improving, follow daily labs  History of TIA  - Patient has not received Plavix since 12/24/2018 as she was refusing at that time. - Continue aspirin; although patient refusing all medications at this time  Stage II sacral pressure injury: Present on admission - Continue wound care as per wound care consult recommendations.  Ongoing episodes of somewhat loose stool in the setting of tube feeds  Cdiff ruled out - Patient started having increased frequency of somewhat loose stool while on tube feeds, cdiff testing negative - consider  decreasing tube feeding rate, increasing calories accordingly, defer to dietary - Patient now febrile as above - fever of 102 on 01/05/2019 x1 - Previous history of C. difficile colitis back in 2018 in which she received a fecal transplant - Patient was covered by vancomycin per ID guidelines being treated for UTI as above  Odynophagia with possible oropharyngeal candidiasis - Unable to use Diflucan because of increased LFTs. Nystatin orally has been ordered but patient previously refusing all meds, continue to dose as allowed. -Speech to reevaluate for possible advancement of diet  Generalized deconditioning/failure to thrive, POA - PT recommends SNF placement - Daughter thinks that patient will get better and will be able to go home and declines SNF placement.  Ethics: - Patient now re-presenting after recent hospitalization, was home for roughly 1-2 days and returned with worsening weakness, severe dehydration, and adult failure to thrive.  Daughter believes that she just will require some nutrition through artificial means to get her back to her previous baseline. She states that her mother was independent with her ADLs and cooking for herself prior to admission (unclear timeframe).  Daughter states that her current decline was acute and only started on 12/28/2018; which is contrary to all nursing reports and previous provider reports during her care during this hospitalization (patient weighed 45kg over a month ago -admitted at 48 kg).  Daughter seems to be struggling with the notion that her mother is in the active dying process in regards to her adult failure to thrive in which she is refusing most if not all interventions and has not been eating or drinking now for weeks. - Given this patient's advanced age, repeat hospitalization in less than a few days  from previous discharge, patient's prognosis is extremely grim, and this has been relayed to the patient's daughter on multiple occasions by  multiple providers. - Ethics has been consulted, following along, we appreciate insight recommendations on this difficult case  DVT prophylaxis: SCDs Code Status: Full code Family Communication: Discussed plan of care with patient's daughter extensively over the phone - explaining patient's current clinical picture, poor prognosis, multiple negative findings as above. Disposition Plan: Continue inpatient, tube feeds; grim prognosis  Consultants:   Gastroenterology, [place]  Infectious disease  Hematology  Palliative care  Subjective: No acute issues or events reported overnight, patient remains poorly verbal - patient does appear more calm/resting comfortably this morning -she is able to answer yes no with head nod/shake somewhat appropriately to questioning today.  Appears to be more verbal with daughter late this afternoon.  Objective:  Vitals:   01/09/19 1418 01/09/19 1500 01/09/19 2043 01/10/19 0500  BP: 126/89 (!) 133/58 131/72 (!) 147/66  Pulse: 95 94 (!) 102 (!) 115  Resp: '18 20 17 16  ' Temp: 98.2 F (36.8 C) 98.3 F (36.8 C) 97.9 F (36.6 C)   TempSrc: Oral Oral Oral   SpO2: 98% 98% 100%   Weight:      Height:        Intake/Output Summary (Last 24 hours) at 01/10/2019 0754 Last data filed at 01/10/2019 0300 Gross per 24 hour  Intake 503 ml  Output 1100 ml  Net -597 ml   Filed Weights   12/26/18 0500 01/03/19 0500 01/04/19 0500  Weight: 61 kg 61 kg 61 kg   Examination:  General: Gaunt appearing elderly woman, resting comfortably HEENT:  Normocephalic atraumatic.  Sclerae nonicteric, noninjected.  Extraocular movements intact bilaterally.  Oral thrush noted.  Poor dentition. Neck:  Without mass or deformity.  Trachea is midline. Lungs:  Clear to auscultate bilaterally without rhonchi, wheeze, or rales. Heart:  Regular rate and rhythm.  Without murmurs, rubs, or gallops. Abdomen: Soft, nontender, nondistended.  Without guarding or rebound. GU: Notable  Candida at labial folds Extremities: Without cyanosis, clubbing, edema, or obvious deformity. Vascular:  Dorsalis pedis and posterior tibial pulses palpable bilaterally. Skin:  Warm and dry, no erythema, stage 1-2 sacral decub - see wound documented by nurse  Data Reviewed: I have personally reviewed following labs and imaging studies  CBC: Recent Labs  Lab 01/06/19 0306 01/07/19 0750 01/08/19 0336 01/09/19 0435 01/10/19 0114  WBC 8.7 16.2* 14.5* 11.8* 7.7  HGB 9.6* 11.6* 11.6* 12.2 10.9*  HCT 27.4* 33.0* 32.2* 33.1* 30.7*  MCV 89.5 89.4 86.8 85.5 88.5  PLT 75* 88* 93* 68* 76*   Basic Metabolic Panel:  Recent Labs  Lab 01/04/19 0134  01/06/19 0306 01/07/19 0750 01/08/19 0336 01/09/19 0435 01/10/19 0114  NA 130*   < > 130* 130* 130* 134* 137  K 3.3*   < > 3.6 5.1 4.6 4.9 5.1  CL 112*   < > 112* 111 109 112* 114*  CO2 13*   < > 15* 12* 16* 17* 16*  GLUCOSE 112*   < > 120* 104* 109* 100* 108*  BUN 8   < > 17 21 26* 28* 29*  CREATININE 0.75   < > 0.76 0.90 1.06* 1.04* 0.91  CALCIUM 8.1*   < > 7.3* 7.5* 7.7* 8.0* 8.1*  MG 1.5*  --   --   --   --   --   --   PHOS 2.5  --   --   --   --   --   --    < > =  values in this interval not displayed.   GFR: Estimated Creatinine Clearance: 36.7 mL/min (by C-G formula based on SCr of 0.91 mg/dL).   Liver Function Tests: Recent Labs  Lab 01/06/19 0306 01/07/19 0750 01/08/19 0336 01/09/19 0435 01/10/19 0114  AST 184* 186* 189* 175* 147*  ALT 65* 61* 61* 53* 44  ALKPHOS 168* 316* 444* 555* 615*  BILITOT 0.6 1.2 1.4* 1.7* 1.2  PROT 3.8* 4.2* 5.0* 5.3* 5.1*  ALBUMIN 1.4* 1.6* 1.8* 1.8* 1.6*   Cardiac Enzymes: Recent Labs  Lab 01/05/19 0153 01/06/19 0306 01/09/19 1016  CKTOTAL 229 370* 150  CKMB 5.6* 6.8*  --    CBG: Recent Labs  Lab 01/06/19 0814 01/07/19 0935 01/07/19 1004 01/08/19 0744 01/09/19 0801  GLUCAP 100* 64* 124* 74 96    Recent Results (from the past 240 hour(s))  C difficile quick scan w PCR  reflex     Status: None   Collection Time: 01/03/19  5:36 PM   Specimen: Stool  Result Value Ref Range Status   C Diff antigen NEGATIVE NEGATIVE Final   C Diff toxin NEGATIVE NEGATIVE Final   C Diff interpretation No C. difficile detected.  Final    Comment: Performed at [facility], [place] [address], [place], [place] [address]  Gastrointestinal Panel by PCR , Stool     Status: None   Collection Time: 01/03/19  5:36 PM   Specimen: Stool  Result Value Ref Range Status   Campylobacter species NOT DETECTED NOT DETECTED Final   Plesimonas shigelloides NOT DETECTED NOT DETECTED Final   Salmonella species NOT DETECTED NOT DETECTED Final   Yersinia enterocolitica NOT DETECTED NOT DETECTED Final   Vibrio species NOT DETECTED NOT DETECTED Final   Vibrio cholerae NOT DETECTED NOT DETECTED Final   Enteroaggregative E coli (EAEC) NOT DETECTED NOT DETECTED Final   Enteropathogenic E coli (EPEC) NOT DETECTED NOT DETECTED Final   Enterotoxigenic E coli (ETEC) NOT DETECTED NOT DETECTED Final   Shiga like toxin producing E coli (STEC) NOT DETECTED NOT DETECTED Final   Shigella/Enteroinvasive E coli (EIEC) NOT DETECTED NOT DETECTED Final   Cryptosporidium NOT DETECTED NOT DETECTED Final   Cyclospora cayetanensis NOT DETECTED NOT DETECTED Final   Entamoeba histolytica NOT DETECTED NOT DETECTED Final   Giardia lamblia NOT DETECTED NOT DETECTED Final   Adenovirus F40/41 NOT DETECTED NOT DETECTED Final   Astrovirus NOT DETECTED NOT DETECTED Final   Norovirus GI/GII NOT DETECTED NOT DETECTED Final   Rotavirus A NOT DETECTED NOT DETECTED Final   Sapovirus (I, II, IV, and V) NOT DETECTED NOT DETECTED Final    Comment: Performed at [facility], [address] [place], [place] [address]    Radiology Studies: No results found.  Scheduled Meds:  acetaminophen  1,000 mg Per Tube Q6H   aspirin  81 mg Per Tube Daily   folic acid  1 mg Per Tube Daily   Gerhardt's butt cream   Topical TID    latanoprost  1 drop Both Eyes QHS   megestrol  400 mg Per Tube Daily   mirtazapine  30 mg Per NG tube QHS   multivitamin  15 mL Oral Daily   nystatin  5 mL Oral QID   nystatin   Topical TID   pantoprazole sodium  40 mg Per Tube Daily   sodium chloride flush  3 mL Intravenous Q12H   Continuous Infusions:  sodium chloride     dextrose 5 % and 0.45% NaCl 10  mL/hr at 01/05/19 1700   feeding supplement (OSMOLITE 1.2 CAL) 1,000 mL (01/08/19 2127)     LOS: 21 days   Time spent: >30  Little Ishikawa, DO [facility] Pager [phone]  If 7PM-7AM, please contact night-coverage [email or web address] Password [facility] 01/10/2019, 7:54 AM

## 2019-01-10 NOTE — Progress Notes (Addendum)
Nutrition Follow-up  DOCUMENTATION CODES:   Severe malnutrition in context of chronic illness  INTERVENTION:   -ContinueOsmolite 1.2@45ml /hr   Tube feeding regimen provides1296kcal (100% of needs),60grams of protein, and 853ml of H2O.   NUTRITION DIAGNOSIS:   Severe Malnutrition related to chronic illness(TIA) as evidenced by energy intake < or equal to 75% for > or equal to 1 month, moderate fat depletion, severe fat depletion, moderate muscle depletion, severe muscle depletion.  Ongoing  GOAL:   Patient will meet greater than or equal to 90% of their needs  Progressing   MONITOR:   PO intake, Supplement acceptance, Diet advancement, Labs, Weight trends, TF tolerance, Skin, I & O's  REASON FOR ASSESSMENT:   Consult Enteral/tube feeding initiation and management  ASSESSMENT:   Sydney [name] is a 85 y.o. 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  Patient is accompanied by her daughter who assists with the history.  Patient has history of recurrent C. difficile colitis, has continued to have loose stools, though this has improved to 4 or 5/day, has had progressively worsening appetite, eating only 3 or 4 bites and unable to even drink half of a nutritional supplement drink.  Patient denies any abdominal pain since the recent hospitalization, no fevers or chills have been noted, and she denies any dysuria though she may have complained of this a few days ago.  She was requiring assistance with ambulation and had home health PT set up at recent discharge, but has become even more weak in general since leaving the hospital and had a near syncopal episode today when she tried to get up from a bedside  commode.  8/27- megace initiated, SLP recommend D3 diet with thin liquids 8/29- nystatin added for oral thrush 9/4- pt daughter agreeable to TF 9/5- NGT placement unsuccessful x 2 (RN bedside placement) 9/7- cortrak tube placed; tip of tube confirmed in stomach  Reviewed I/O's: -597 ml x 24 hours and +15.7 L since 12/27/18  UOP: 1.1 L x 24 hours  Pt appears physically worse compared to RD recollection from admission. Pt did not respond to voice when RD called her name, but grimaced in pain when RD examined her.   Noted Osmolite 1.2 infusing via cortrak tube @ 45 ml/hr, which is meeting 100% of estimated kcal and protein needs. Cortrak tube is a temporary source of alternative means of nutrition/hydration and is not recommended that tube be in place for greater than 30 days. Noted pt with significant edema to lower extremities.   Palliative care following for ongoing goals of care discussions. Ethics has also been consulted and plans to hold a multi-disciplinary team meeting with pt daughter to discuss goals of care and pt's lack of progress.   Labs reviewed: CBGS: 96.  NUTRITION - FOCUSED PHYSICAL EXAM:    Most Recent Value  Orbital Region  Severe depletion  Upper Arm Region  Severe depletion  Thoracic and Lumbar Region  Unable to assess  Buccal Region  Moderate depletion  Temple Region  Severe depletion  Clavicle Bone Region  Moderate depletion  Clavicle and Acromion Bone Region  Unable to assess  Scapular Bone Region  Moderate depletion  Dorsal Hand  Severe depletion  Patellar Region  Moderate depletion  Anterior Thigh Region  Moderate depletion  Posterior  Calf Region  Moderate depletion  Edema (RD Assessment)  Moderate  Hair  Reviewed  Eyes  Reviewed  Mouth  Reviewed  Skin  Reviewed  Nails  Reviewed       Diet Order:   Diet Order            DIET DYS 3 Room service appropriate? Yes; Fluid consistency: Thin  Diet effective now              EDUCATION NEEDS:    Not appropriate for education at this time  Skin:  Skin Assessment: Skin Integrity Issues: Skin Integrity Issues:: Stage II Stage II: sacrum, coccyx  Last BM:  01/10/19  Height:   Ht Readings from Last 1 Encounters:  12/20/18 5\' 3"  (1.6 m)    Weight:   Wt Readings from Last 1 Encounters:  01/04/19 61 kg    Ideal Body Weight:  52.3 kg  BMI:  Body mass index is 23.82 kg/m.  Estimated Nutritional Needs:   Kcal:  1250-1450  Protein:  60-75 grams  Fluid:  > 1.2 L    Zubin Pontillo A. Jimmye Norman, RD, LDN, [place] Registered Dietitian II Certified Diabetes Care and Education Specialist Pager: [phone] After hours Pager: [phone]

## 2019-01-10 NOTE — Ethics Note (Addendum)
I have been in contact with Dr. Avon Gully who tells me that the patient remains essentially about the same--talked to ADON of Unit who informs however patient has interactive [minimally] and has started to verbalize over past several days and might be eating some  In addition the patient's daughter is requesting a poly-consult to discuss plan of care for her mother  I have spoken to Dr. Hulen Skains, Dr. Rush Landmark and Dr. Thurmond Butts it seems like given the slight and incremental improvement to mentation, there isn't as clear-cut a case at this time to enact the futility policy, and it looks there there will be attempt to meet with patient's daughter later this week with all consultants available to discuss reasonable risk benefits and alternatives to Rx  Please let me know how else I can help. Thank you for your time  Verneita Griffes, MD Ethics Committee

## 2019-01-11 DIAGNOSIS — R945 Abnormal results of liver function studies: Secondary | ICD-10-CM | POA: Diagnosis not present

## 2019-01-11 DIAGNOSIS — R627 Adult failure to thrive: Secondary | ICD-10-CM | POA: Diagnosis not present

## 2019-01-11 LAB — COMPREHENSIVE METABOLIC PANEL
ALT: 43 U/L (ref 0–44)
AST: 130 U/L — ABNORMAL HIGH (ref 15–41)
Albumin: 1.7 g/dL — ABNORMAL LOW (ref 3.5–5.0)
Alkaline Phosphatase: 639 U/L — ABNORMAL HIGH (ref 38–126)
Anion gap: 6 (ref 5–15)
BUN: 24 mg/dL — ABNORMAL HIGH (ref 8–23)
CO2: 17 mmol/L — ABNORMAL LOW (ref 22–32)
Calcium: 8.3 mg/dL — ABNORMAL LOW (ref 8.9–10.3)
Chloride: 115 mmol/L — ABNORMAL HIGH (ref 98–111)
Creatinine, Ser: 0.86 mg/dL (ref 0.44–1.00)
GFR calc Af Amer: >60 mL/min (ref >60)
GFR calc non Af Amer: >60 mL/min (ref >60)
Glucose, Bld: 100 mg/dL — ABNORMAL HIGH (ref 70–99)
Potassium: 5.2 mmol/L — ABNORMAL HIGH (ref 3.5–5.1)
Sodium: 138 mmol/L (ref 135–145)
Total Bilirubin: 0.7 mg/dL (ref 0.3–1.2)
Total Protein: 4.9 g/dL — ABNORMAL LOW (ref 6.5–8.1)

## 2019-01-11 LAB — CBC
HCT: 30.2 % — ABNORMAL LOW (ref 36.0–46.0)
Hemoglobin: 10.7 g/dL — ABNORMAL LOW (ref 12.0–15.0)
MCH: 31.3 pg (ref 26.0–34.0)
MCHC: 35.4 g/dL (ref 30.0–36.0)
MCV: 88.3 fL (ref 80.0–100.0)
Platelets: 82 10*3/uL — ABNORMAL LOW (ref 150–400)
RBC: 3.42 MIL/uL — ABNORMAL LOW (ref 3.87–5.11)
RDW: 15.9 % — ABNORMAL HIGH (ref 11.5–15.5)
WBC: 5.8 10*3/uL (ref 4.0–10.5)
nRBC: 1.4 % — ABNORMAL HIGH (ref 0.0–0.2)

## 2019-01-11 LAB — GLUCOSE, CAPILLARY: Glucose-Capillary: 90 mg/dL (ref 70–99)

## 2019-01-11 MED ORDER — ACETAMINOPHEN 325 MG PO TABS
650.0000 mg | ORAL_TABLET | Freq: Four times a day (QID) | ORAL | Status: DC | PRN
  Administered 2019-01-11 – 2019-01-30 (×16): 650 mg via NASOGASTRIC
  Filled 2019-01-11 (×20): qty 2

## 2019-01-11 MED ORDER — ADULT MULTIVITAMIN LIQUID CH
15.0000 mL | Freq: Every day | ORAL | Status: DC
  Administered 2019-01-12 – 2019-01-19 (×6): 15 mL
  Filled 2019-01-11 (×10): qty 15

## 2019-01-11 MED ORDER — ACETAMINOPHEN 650 MG RE SUPP: 650.0000 mg | Freq: Four times a day (QID) | RECTAL | Status: DC | PRN

## 2019-01-11 MED ORDER — ONDANSETRON HCL 4 MG/2ML IJ SOLN: 4.0000 mg | Freq: Four times a day (QID) | INTRAMUSCULAR | Status: DC | PRN

## 2019-01-11 MED ORDER — ONDANSETRON HCL 4 MG PO TABS: 4.0000 mg | ORAL_TABLET | Freq: Four times a day (QID) | ORAL | Status: DC | PRN

## 2019-01-11 NOTE — Care Management Important Message (Signed)
Important Message  Patient Details  Name: Sydney [name] MRN: [id number] Date of Birth: 10-21-1930   Medicare Important Message Given:  Yes     [name] 01/11/2019, 2:34 PM

## 2019-01-11 NOTE — Evaluation (Signed)
Clinical/Bedside Swallow Evaluation Patient Details  Name: Sydney [name] MRN: [id number] Date of Birth: 04-24-1931  Today's Date: 01/11/2019 Time: SLP Start Time (ACUTE ONLY): [id number] SLP Stop Time (ACUTE ONLY): 1051 SLP Time Calculation (min) (ACUTE ONLY): 7 min  Past Medical History:  Past Medical History:  Diagnosis Date  . Anemia    years ago  . Aortic atherosclerosis ([place])   . Arthritis    "left hand" (02/17/2013)  . Bilateral inguinal hernia    INCIDENTAL CT 1/20  . C. difficile diarrhea   . Cataract   . Dehydration 12/29/2018  . Diplopia    CRANIAL 6 NERVE PALSY  . Dizzy   . Dyslipidemia   . Gastritis   . Glaucoma   . High cholesterol    "on RX years ago" (02/17/2013)  . Hypertension   . Leukopenia   . Meningioma ([place]) 05/01/2015  . Osteoporosis 02/2014   T score -2.5  followed by Dr. Lysle Rubens  . Palpitations    PACs, PVCs and short runs of atrial tachycardia on Holter monitoring  . PVD (peripheral vascular disease) ([place])   . Rhinitis   . Syncopal episodes 2003  . TIA (transient ischemic attack) 1990's  . Trigger thumb of left hand   . Weakness 12/29/2018  . Winter itch    Past Surgical History:  Past Surgical History:  Procedure Laterality Date  . ANAL FISTULECTOMY  1970  . COLONOSCOPY    . COLONOSCOPY WITH PROPOFOL N/A 12/02/2016   Procedure: COLONOSCOPY WITH PROPOFOL;  Surgeon: Gatha Mayer, MD;  Location: [facility] ENDOSCOPY;  Service: Endoscopy;  Laterality: N/A;  . FECAL TRANSPLANT N/A 12/02/2016   Procedure: FECAL TRANSPLANT;  Surgeon: Gatha Mayer, MD;  Location: [facility] ENDOSCOPY;  Service: Endoscopy;  Laterality: N/A;  . 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  CT of the abdomen and pelvis was negative for  acute intra-abdominal or pelvic abnormality.  She was found to have mildly elevated LFTs along with hyponatremia and pancytopenia.  Pt is currently on Dys 3 plus supplements.  She has had worsening appetite since 10/31/22; recent death of her sister has led to concerns for depression. MRI brain negative.  Pt with protein cal malnutrition, FTT; prognosis guarded to poor per MD notes. No new chest imaging since 8/27 evaluation.  Palliative and ethics have been consulted, pt remains full code and has NG at present   Assessment / Plan / Recommendation Clinical Impression  Pt drowsy, but responsive on SLP arrival.  Pt did not follow commands for OME.  SLP was unable to assess oral cavity.  Thermal stimulation was applied to lips with ice chip on spoon.  Pt withdrew, shook her head, and grimaced. Pt refused all PO trials including thin liquid by cup and straw and puree despite maximal encouragement from SLP.  Pt became tearful with further attempts for PO trials and asked SLP to leave her alone.  Pt stated she didn't know that she wanted anything to eat or drink.  SLP is unable to make a safe diet at this time and will defer diet orders to team.  Pt has NGT at this time.  She is unlikely to meet nutritional needs via oral intake 2/2 poor particiation.  SLP will follow for continued PO trials. 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  CT of the abdomen and pelvis was negative for acute intra-abdominal or pelvic abnormality.  She was found to have mildly elevated LFTs along with hyponatremia and pancytopenia.  Pt is currently on Dys 3 plus supplements.  She has had worsening appetite since 2022-11-01; recent death of her sister has led to concerns for depression. MRI brain negative.  Pt with protein cal malnutrition, FTT; prognosis guarded to poor per MD notes. No new chest imaging since 8/27 evaluation.  Palliative and ethics have been consulted, pt remains full code and has NG at present Type of Study: Bedside Swallow Evaluation Previous Swallow Assessment: BSE 8/27 Diet Prior to this Study: Dysphagia 3 (soft);Thin liquids Temperature Spikes Noted: No Respiratory Status: Room air History of Recent Intubation: No Behavior/Cognition: Alert;Uncooperative;Doesn't follow directions Oral Cavity Assessment: (Unable to assess) Oral Care Completed by SLP: No Oral Cavity - Dentition: (Unable to assess) Vision: (unable to assess) Self-Feeding Abilities: Total assist(unable to assess) Patient Positioning: Upright in bed Baseline Vocal Quality: (congested) Volitional Cough: Cognitively unable to elicit Volitional Swallow: Unable to elicit    Oral/Motor/Sensory Function Overall Oral Motor/Sensory Function: (Unable to assess)   Ice Chips Ice chips: Impaired Presentation: Spoon Oral Phase Impairments: Other (comment) Other Comments: Would not accept bolus; Withdrew from thermal stimulation to lips   Thin Liquid Thin Liquid: Impaired Oral Phase Impairments: Other (comment) Other Comments: Would not accept by cup, straw, or spoon    Nectar Thick Nectar Thick Liquid: Not tested   Honey Thick Honey Thick Liquid: Not tested   Puree Puree: Impaired Oral Phase Impairments: Other (comment) Other  Comments: Would not accept bolus trials   Solid            Celedonio Savage, MA, [place] Office: [phone] 01/11/2019,11:07 AM

## 2019-01-11 NOTE — Progress Notes (Signed)
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 CT of the abdomen and pelvis was negative for acute intra-abdominal or pelvic abnormality. She was found to have mildly elevated LFTs along with hyponatremia and pancytopenia. She was given IV fluids. ID and GI were consulted. Palliative care was subsequently consulted for goals of care discussion at admission.  Multidisciplinary meeting today with medicine, administration, palliative care, GI, nursing staff, patient, patient's pastor, and patient advocate.  Lengthy discussion about patient's ongoing, albeit minimal, improvement with NG tube feeds status post antibiotics and supportive care.  Daughter remains some frustrated given no clear precipitating diagnosis which makes prognosis/timeline somewhat difficult to define.  Patient's daughter is requesting transfer to a different facility for possible further evaluation and work-up.  We discussed that given our extensive work-up here the only remaining evaluations left are MRCP, endoscopy and possibly liver biopsy, all of these would require likely anesthesia/sedation and risks would likely outweigh benefits at this point given patient's advanced age and weakened condition given poor p.o. intake.  Given the patient previously refused disposition to SNF and pending possible transition to outside facility, our other option for disposition would ultimately be home with daughter to continue tube feeds and supportive care as she sees fit.  At this time patient has no obvious acute illness from which she suffers which has caused her mental  status change.  Given her improvement at this time we would continue to focus on supportive care and NG tube feeds, as further work-up can be facilitated at an outpatient procedure or other facility at this time.  Hopefully over the next 24 to 48 hours patient can be reevaluated by PT and speech therapy with further engagement and recommendations about advancement of diet if safe.  Over the past 72 hours patient's mental status has minimally improved, over the previous week patient has only able to moan/calling out in pain.  Patient now able to shake her head yes or no to simple yes/no questions which seems to be appropriate, otherwise verbal status continues to be limited to weak 1 word answers which are quite unintelligible for the most part. Patient does have ongoing NG tube with medications and food per daughter's wishes. Patient has had extensive work-up as below including imaging, labs all of which essentially have been generally unremarkable for overt cause of patient's mental and physical decline. Patient recently treated for questionable UTI given febrile events and mental status changes in hopes that this will improve her status although very minimal if any improvement, patient continues to have markedly poor p.o. intake and likely concurrent depression ongoing. Patient is an advanced age, appears severely malnourished at this point -not consistent with any acute dietary changes but appears to be more chronic, previously refusing all medications and management although daughter continues to request ongoing treatment, multiple consults as well as ongoing evaluation despite extensive negative work-up as below. Per discussion with palliative care as well as GI it appears the patient's daughter is unfortunately unable to cope with the fact that her mother is actively dying. We continue to follow along closely, offer what support we can, ethics has been consulted given inappropriate requests by daughter for  consults and treatment which in my opinion would be futile (requesting at one point for obgyn to evaluate for possible exam to rule out vaginal cancer which we discussed wouldn't likely be tolerated - patient then asked for blood work to rule out GU cancers -which certainly would not have any meaningful impact on her current disposition).  Additionally patient continues to be somewhat agitated, unclear if patient is in pain or simply agitated due to her worsening mental status, but daughter is requesting we hold off on any further pain medication or anxiolytics.  We continue to recommend to focus on comfort measures and comfort care at this time, but daughter continues to request further workup  I am doubtful that any further imaging, lab work or procedures at this time will change our trajectory.  We unfortunately do not have a working diagnosis for patient's decline other than markedly poor p.o. intake in the setting of likely depression other than possible UTI given recent fever and patient's mental status changes, but have now been treated appropriately with minimal improvement in patient's status.  Patient's daughter also continues to be fixated on single dose of 0.68m lorazepam and 0.547mof morphine on night shift on 01/04/19 and 01/05/19 respectively that she believes has caused her mother's current nonverbal condition despite our explanation that this medication would have likely cleared within a few hours of administration.  At this time patient's outlook remains grim, patient remains a full code despite discussion with family as patient had previously noted " I want everything done" which at this time precludes any further discussion about code/palliative/hospice.  Patient's mental status remains remarkably poor, patient now completed antibiotic coverage for questionable UTI.  Recent repeat abdominal imaging remarkable for ascites only, not surprising given patient's very low albumin level again in the  setting of poor p.o. intake.  Patient remains high risk for worsening mental status, aspiration, elevated risk of morbidity mortality given advanced age, chronic comorbid conditions and above.  Assessment & Plan:   Principal Problem:   Adult failure to thrive Active Problems:   Pancytopenia (HCC)   Hyponatremia   Protein calorie malnutrition (HCC)   History of TIA (transient ischemic attack)   Generalized weakness   Pressure injury of skin   Transaminitis   Malnutrition of moderate degree   Evaluation by psychiatric service required   Failure to thrive in an adult, severe protein caloric malnutrition, hypoalbuminemia. POA, worsening - Patient now representing to the hospital with weakness, and failure to thrive with decreased oral intake. - CT head negative for acute cranial abnormality - MR brain no evidence of acute stroke - Unclear etiology, but there was thought to be a component of depression and psychiatry was consulted with recommendations of initiation of antidepressant therapy, but patient nor daughter interested at this time - PT/OT recommends SNF, patient/daughter declined -Very little oral intake during hospitalization - even prior to mental status changes - Continue fluids/coretrak tube feeds - Continue Megace/Remeron for appetite stimulation - NG tube placement 9/5 unsuccessful with multiple attempts - Coretrak placement 01/03/2019; starting tube feeds - Despite aggressive measures, patient's condition continues to be quite poor - We have high suspicion that her mother suffers from severe depression with recent loss of her sister 2 months ago with extreme ataxia and failure to thrive, although her daughter continues to think that this thLink Snuffers completely wrong  Acute metabolic encephalopathy, likely secondary to above, minimally improving - Patient able to shake her head yes or no to simple questions,  seemingly appropriately although verbal status remains quite poor -  Poorly interactive, unable to follow commands - At this time unclear etiology other than depression, poor p.o. intake and failure to thrive. - Patient did receive a small dose lorazepam/morphine overnight on 9/9 and 9/10 but otherwise polypharmacy/medication is unlikely  ?UTI given encephalopathy and fever, status post treatment; resolved Yeast infection, groin/oral thrush - Unable to assess for symptoms given patient's mental status -no longer reaching for groin or moaning in pain - Leukocytosis downtrending currently 11, remains afebrile - Patient continues to be somewhat difficult/combative, will treat prophylactically as getting a clean urine from the patient is likely going to be acutely difficult - Treat UTI per ID following, appreciate recommendations - Nystatin topical powder to groin, nystatin oral for p.o. thrush  Elevated LFTs, improving, unclear etiology initially ? Hypovolemia/shock, POA Concurrent ascites - Patient presenting with notable elevated transaminitis; low albumin - now stabilizing/improving - Plt low but stable; INR wnl - AST ALT continue to minimally improve although remain somewhat elevated - Alk phos and total bilirubin continue to increase -likely secondary to cephalosporins per discussion with GI - likely to improve over the next few days now that abx have completed. - Hepatitis panel negative. EBV and CMV IgM are negative.   - Right upper quadrant ultrasound only showed gallbladder sludging.   - Initial CT abdomen/pelvis with no evidence of acute intra-abdominal or pelvic abnormality but with diffuse fluid within the colon suggestive of possible enteritis versus diarrheal illness (but patient was having no GI symptoms at that time). - KUB on 12/29/2018 with mild diffuse gaseous prominence without obstructive pattern.  - Suspicion for possible mild shock liver given she was hypotensive on presentation.   - GI also believes that this transaminitis could reflect from  skeletal muscle release as well - CK, MB minimally elevated -but improving - GI following, repeat CT on 01/06/2019 remarkable for ascites  Leukopenia -->now leukocytosis as above, resolved Thrombocytopenia, stable - Evaluated by hematology previously - Patient has been started on vitamin B12 parenterally as she had low levels of vitamin B12 during last admission but vitamin B12 level normal this admission - Minimally improving at this point with nutrition, unlikely affected by B12 given timing - Platelets labile but 50-80 over the past few weeks. - No further work-up indicated in the inpatient setting, patient can follow-up in the outpatient setting if indicated  AKI, mild, resolving - Now improving, likely in the setting of failure to thrive as above with marked hypo-albuminemia and poor PO intake  Hyponatremia, likely hypovolemic, resolved - Sodium 128 at admission likely in the setting of poor p.o. intake and dehydration - Improving with free water flushes via NG tube and tube feedings  Hypokalemia, Hypomagnesemia in the setting of poor p.o. intake - Improving, follow daily labs  History of TIA  - Patient has not received Plavix since 12/24/2018 as she was refusing at that time. - Continue aspirin; although patient refusing all medications at this time  Stage II sacral pressure injury: Present on admission - Continue wound care as per wound care consult recommendations.  Ongoing episodes of somewhat loose stool in the setting of tube feeds  Cdiff ruled out - Patient started having increased frequency of somewhat loose stool while on tube feeds, cdiff testing negative - consider decreasing tube feeding rate, increasing calories accordingly, defer to dietary - Patient now febrile as above - fever of 102 on 01/05/2019 x1 - Previous history of C. difficile colitis back in  2018 in which she received a fecal transplant - Patient was covered by vancomycin per ID guidelines being treated for  UTI as above  Odynophagia with possible oropharyngeal candidiasis - Unable to use Diflucan because of increased LFTs. Nystatin orally has been ordered but patient previously refusing all meds, continue to dose as allowed. -Speech to reevaluate for possible advancement of diet  Generalized deconditioning/failure to thrive, POA - PT recommends SNF placement - Daughter thinks that patient will get better and will be able to go home and declines SNF placement.  Ethics: - Patient now re-presenting after recent hospitalization, was home for roughly 1-2 days and returned with worsening weakness, severe dehydration, and adult failure to thrive.  Daughter believes that she just will require some nutrition through artificial means to get her back to her previous baseline. She states that her mother was independent with her ADLs and cooking for herself prior to admission (unclear timeframe).  Daughter states that her current decline was acute and only started on 12/28/2018; which is contrary to all nursing reports and previous provider reports during her care during this hospitalization (patient weighed 45kg over a month ago -admitted at 48 kg).  Daughter seems to be struggling with the notion that her mother is in the active dying process in regards to her adult failure to thrive in which she is refusing most if not all interventions and has not been eating or drinking now for weeks. - Given this patient's advanced age, repeat hospitalization in less than a few days from previous discharge, patient's prognosis is grim, and this has been relayed to the patient's daughter on multiple occasions by multiple providers. - Ethics has been consulted, following along, we appreciate insight recommendations on this difficult case  DVT prophylaxis: SCDs Code Status: Full code Family Communication: hour+ long meeting today as above with multidisciplinary team Disposition Plan: Continue inpatient, tube feeds; grim prognosis  - possible transfer if we/patient can find an accepting facility  Consultants:   Gastroenterology, Catherine  Infectious disease  Hematology  Palliative care  Subjective: No acute issues or events reported overnight, patient remains poorly verbal - patient does appear more calm/resting comfortably this morning -she is able to answer yes no with head nod/shake somewhat appropriately to questioning today.  Appears to be more verbal with daughter late this afternoon.  Objective:  Vitals:   01/10/19 0500 01/10/19 1451 01/10/19 2023 01/11/19 0432  BP: (!) 147/66 115/68 128/76 134/76  Pulse: (!) 115 94 81 (!) 105  Resp: 16 20  (!) 23  Temp:  97.8 F (36.6 C) 98.8 F (37.1 C) 98.6 F (37 C)  TempSrc:  Oral Oral Oral  SpO2:   95% 99%  Weight:      Height:        Intake/Output Summary (Last 24 hours) at 01/11/2019 0800 Last data filed at 01/11/2019 0710 Gross per 24 hour  Intake 90 ml  Output 750 ml  Net -660 ml   Filed Weights   12/26/18 0500 01/03/19 0500 01/04/19 0500  Weight: 61 kg 61 kg 61 kg   Examination:  General: Gaunt appearing elderly woman, resting comfortably HEENT:  Normocephalic atraumatic.  Sclerae nonicteric, noninjected.  Extraocular movements intact bilaterally.  Oral thrush noted.  Poor dentition. Neck:  Without mass or deformity.  Trachea is midline. Lungs:  Clear to auscultate bilaterally without rhonchi, wheeze, or rales. Heart:  Regular rate and rhythm.  Without murmurs, rubs, or gallops. Abdomen: Soft, nontender, nondistended.  Without guarding or rebound.  GU: Notable Candida at labial folds Extremities: Without cyanosis, clubbing, edema, or obvious deformity. Vascular:  Dorsalis pedis and posterior tibial pulses palpable bilaterally. Skin:  Warm and dry, no erythema, stage 2 sacral decub - see wound documented by nurse  Data Reviewed: I have personally reviewed following labs and imaging studies  CBC: Recent Labs  Lab 01/07/19 0750 01/08/19  0336 01/09/19 0435 01/10/19 0114 01/11/19 0147  WBC 16.2* 14.5* 11.8* 7.7 5.8  HGB 11.6* 11.6* 12.2 10.9* 10.7*  HCT 33.0* 32.2* 33.1* 30.7* 30.2*  MCV 89.4 86.8 85.5 88.5 88.3  PLT 88* 93* 68* 76* 82*   Basic Metabolic Panel:  Recent Labs  Lab 01/07/19 0750 01/08/19 0336 01/09/19 0435 01/10/19 0114 01/11/19 0147  NA 130* 130* 134* 137 138  K 5.1 4.6 4.9 5.1 5.2*  CL 111 109 112* 114* 115*  CO2 12* 16* 17* 16* 17*  GLUCOSE 104* 109* 100* 108* 100*  BUN 21 26* 28* 29* 24*  CREATININE 0.90 1.06* 1.04* 0.91 0.86  CALCIUM 7.5* 7.7* 8.0* 8.1* 8.3*   GFR: Estimated Creatinine Clearance: 38.8 mL/min (by C-G formula based on SCr of 0.86 mg/dL).   Liver Function Tests: Recent Labs  Lab 01/07/19 0750 01/08/19 0336 01/09/19 0435 01/10/19 0114 01/11/19 0147  AST 186* 189* 175* 147* 130*  ALT 61* 61* 53* 44 43  ALKPHOS 316* 444* 555* 615* 639*  BILITOT 1.2 1.4* 1.7* 1.2 0.7  PROT 4.2* 5.0* 5.3* 5.1* 4.9*  ALBUMIN 1.6* 1.8* 1.8* 1.6* 1.7*   Cardiac Enzymes: Recent Labs  Lab 01/05/19 0153 01/06/19 0306 01/09/19 1016  CKTOTAL 229 370* 150  CKMB 5.6* 6.8*  --    CBG: Recent Labs  Lab 01/07/19 0935 01/07/19 1004 01/08/19 0744 01/09/19 0801 01/10/19 0835  GLUCAP 64* 124* 74 96 96    Recent Results (from the past 240 hour(s))  C difficile quick scan w PCR reflex     Status: None   Collection Time: 01/03/19  5:36 PM   Specimen: Stool  Result Value Ref Range Status   C Diff antigen NEGATIVE NEGATIVE Final   C Diff toxin NEGATIVE NEGATIVE Final   C Diff interpretation No C. difficile detected.  Final    Comment: Performed at [facility], [place] [address], [place], [place] [address]  Gastrointestinal Panel by PCR , Stool     Status: None   Collection Time: 01/03/19  5:36 PM   Specimen: Stool  Result Value Ref Range Status   Campylobacter species NOT DETECTED NOT DETECTED Final   Plesimonas shigelloides NOT DETECTED NOT DETECTED Final   Salmonella species  NOT DETECTED NOT DETECTED Final   Yersinia enterocolitica NOT DETECTED NOT DETECTED Final   Vibrio species NOT DETECTED NOT DETECTED Final   Vibrio cholerae NOT DETECTED NOT DETECTED Final   Enteroaggregative E coli (EAEC) NOT DETECTED NOT DETECTED Final   Enteropathogenic E coli (EPEC) NOT DETECTED NOT DETECTED Final   Enterotoxigenic E coli (ETEC) NOT DETECTED NOT DETECTED Final   Shiga like toxin producing E coli (STEC) NOT DETECTED NOT DETECTED Final   Shigella/Enteroinvasive E coli (EIEC) NOT DETECTED NOT DETECTED Final   Cryptosporidium NOT DETECTED NOT DETECTED Final   Cyclospora cayetanensis NOT DETECTED NOT DETECTED Final   Entamoeba histolytica NOT DETECTED NOT DETECTED Final   Giardia lamblia NOT DETECTED NOT DETECTED Final   Adenovirus F40/41 NOT DETECTED NOT DETECTED Final   Astrovirus NOT DETECTED NOT DETECTED Final   Norovirus GI/GII NOT DETECTED NOT DETECTED Final  Rotavirus A NOT DETECTED NOT DETECTED Final   Sapovirus (I, II, IV, and V) NOT DETECTED NOT DETECTED Final    Comment: Performed at [facility], [address] [place], [place] [address]    Radiology Studies: No results found.  Scheduled Meds: . aspirin  81 mg Per Tube Daily  . chlorhexidine  15 mL Mouth Rinse BID  . folic acid  1 mg Per Tube Daily  . Gerhardt's butt cream   Topical TID  . latanoprost  1 drop Both Eyes QHS  . mouth rinse  15 mL Mouth Rinse q12n4p  . megestrol  400 mg Per Tube Daily  . mirtazapine  30 mg Per NG tube QHS  . multivitamin  15 mL Oral Daily  . nystatin  5 mL Oral QID  . nystatin   Topical TID  . pantoprazole sodium  40 mg Per Tube Daily  . sodium chloride flush  3 mL Intravenous Q12H   Continuous Infusions: . sodium chloride    . dextrose 5 % and 0.45% NaCl 10 mL/hr at 01/05/19 1700  . feeding supplement (OSMOLITE 1.2 CAL) 1,000 mL (01/08/19 2127)     LOS: 22 days   Time spent: >30  Little Ishikawa, DO [facility] Pager [phone]  If  7PM-7AM, please contact night-coverage [email or web address] Password [email or web address] 01/11/2019, 8:00 AM

## 2019-01-12 DIAGNOSIS — E43 Unspecified severe protein-calorie malnutrition: Secondary | ICD-10-CM | POA: Diagnosis not present

## 2019-01-12 DIAGNOSIS — E538 Deficiency of other specified B group vitamins: Secondary | ICD-10-CM

## 2019-01-12 DIAGNOSIS — G9341 Metabolic encephalopathy: Secondary | ICD-10-CM | POA: Diagnosis not present

## 2019-01-12 DIAGNOSIS — F320 Major depressive disorder, single episode, mild: Secondary | ICD-10-CM

## 2019-01-12 DIAGNOSIS — K7200 Acute and subacute hepatic failure without coma: Secondary | ICD-10-CM | POA: Diagnosis not present

## 2019-01-12 DIAGNOSIS — F329 Major depressive disorder, single episode, unspecified: Secondary | ICD-10-CM | POA: Diagnosis present

## 2019-01-12 DIAGNOSIS — R945 Abnormal results of liver function studies: Secondary | ICD-10-CM | POA: Diagnosis not present

## 2019-01-12 DIAGNOSIS — L89154 Pressure ulcer of sacral region, stage 4: Secondary | ICD-10-CM | POA: Diagnosis not present

## 2019-01-12 DIAGNOSIS — R627 Adult failure to thrive: Secondary | ICD-10-CM | POA: Diagnosis not present

## 2019-01-12 LAB — CBC
HCT: 29 % — ABNORMAL LOW (ref 36.0–46.0)
Hemoglobin: 10.7 g/dL — ABNORMAL LOW (ref 12.0–15.0)
MCH: 32.4 pg (ref 26.0–34.0)
MCHC: 36.9 g/dL — ABNORMAL HIGH (ref 30.0–36.0)
MCV: 87.9 fL (ref 80.0–100.0)
Platelets: 85 10*3/uL — ABNORMAL LOW (ref 150–400)
RBC: 3.3 MIL/uL — ABNORMAL LOW (ref 3.87–5.11)
RDW: 15.7 % — ABNORMAL HIGH (ref 11.5–15.5)
WBC: 5.5 10*3/uL (ref 4.0–10.5)
nRBC: 0.7 % — ABNORMAL HIGH (ref 0.0–0.2)

## 2019-01-12 LAB — COMPREHENSIVE METABOLIC PANEL
ALT: 60 U/L — ABNORMAL HIGH (ref 0–44)
ALT: 62 U/L — ABNORMAL HIGH (ref 0–44)
AST: 169 U/L — ABNORMAL HIGH (ref 15–41)
AST: 157 U/L — ABNORMAL HIGH (ref 15–41)
Albumin: 1.7 g/dL — ABNORMAL LOW (ref 3.5–5.0)
Albumin: 1.6 g/dL — ABNORMAL LOW (ref 3.5–5.0)
Alkaline Phosphatase: 728 U/L — ABNORMAL HIGH (ref 38–126)
Alkaline Phosphatase: 641 U/L — ABNORMAL HIGH (ref 38–126)
Anion gap: 8 (ref 5–15)
Anion gap: 6 (ref 5–15)
BUN: 24 mg/dL — ABNORMAL HIGH (ref 8–23)
BUN: 25 mg/dL — ABNORMAL HIGH (ref 8–23)
CO2: 16 mmol/L — ABNORMAL LOW (ref 22–32)
CO2: 17 mmol/L — ABNORMAL LOW (ref 22–32)
Calcium: 8.3 mg/dL — ABNORMAL LOW (ref 8.9–10.3)
Calcium: 8.2 mg/dL — ABNORMAL LOW (ref 8.9–10.3)
Chloride: 112 mmol/L — ABNORMAL HIGH (ref 98–111)
Chloride: 113 mmol/L — ABNORMAL HIGH (ref 98–111)
Creatinine, Ser: 0.76 mg/dL (ref 0.44–1.00)
Creatinine, Ser: 0.8 mg/dL (ref 0.44–1.00)
GFR calc Af Amer: >60 mL/min (ref >60)
GFR calc non Af Amer: >60 mL/min (ref >60)
Glucose, Bld: 110 mg/dL — ABNORMAL HIGH (ref 70–99)
Glucose, Bld: 110 mg/dL — ABNORMAL HIGH (ref 70–99)
Potassium: 5 mmol/L (ref 3.5–5.1)
Potassium: 4.4 mmol/L (ref 3.5–5.1)
Sodium: 136 mmol/L (ref 135–145)
Sodium: 136 mmol/L (ref 135–145)
Total Bilirubin: 0.8 mg/dL (ref 0.3–1.2)
Total Bilirubin: 0.9 mg/dL (ref 0.3–1.2)
Total Protein: 5.5 g/dL — ABNORMAL LOW (ref 6.5–8.1)
Total Protein: 5 g/dL — ABNORMAL LOW (ref 6.5–8.1)

## 2019-01-12 LAB — AMMONIA: Ammonia: 22 umol/L (ref 9–35)

## 2019-01-12 LAB — MAGNESIUM: Magnesium: 1.7 mg/dL (ref 1.7–2.4)

## 2019-01-12 LAB — GLUCOSE, CAPILLARY: Glucose-Capillary: 106 mg/dL — ABNORMAL HIGH (ref 70–99)

## 2019-01-12 LAB — TSH: TSH: 3.7 u[IU]/mL (ref 0.350–4.500)

## 2019-01-12 LAB — TROPONIN I (HIGH SENSITIVITY): Troponin I (High Sensitivity): 32 ng/L — ABNORMAL HIGH (ref <18)

## 2019-01-12 MED ORDER — ENOXAPARIN SODIUM 40 MG/0.4ML ~~LOC~~ SOLN
40.0000 mg | SUBCUTANEOUS | Status: DC
  Administered 2019-01-12 – 2019-01-30 (×19): 40 mg via SUBCUTANEOUS
  Filled 2019-01-12 (×18): qty 0.4

## 2019-01-12 NOTE — Progress Notes (Signed)
Patient with no IV access due to patient being combative.

## 2019-01-12 NOTE — Progress Notes (Signed)
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 CT of the abdomen and pelvis was negative for acute intra-abdominal or pelvic abnormality. She was found to have mildly elevated LFTs along with hyponatremia and pancytopenia. She was given IV fluids.ID and GI were consulted. Palliative care was subsequently consulted for goals of care discussion at admission.  Multidisciplinary meeting today with medicine, administration, palliative care, GI, nursing staff, patient, patient's pastor, and patient advocate.  Lengthy discussion about patient's ongoing, albeit minimal, improvement with NG tube feeds status post antibiotics and supportive care.  Daughter remains some frustrated given no clear precipitating diagnosis which makes prognosis/timeline somewhat difficult to define.  Patient's daughter is requesting transfer to a different facility for possible further evaluation and work-up.  We discussed that given our extensive work-up here the only remaining evaluations left are MRCP, endoscopy and possibly liver biopsy, all of these would require likely anesthesia/sedation and risks would likely outweigh benefits at this point given patient's advanced age and weakened condition given poor p.o. intake.  Given the patient previously refused disposition to SNF and pending possible transition to outside facility, our other option for disposition would ultimately be home with daughter to continue tube feeds and supportive care as she sees fit.  At this time patient has no obvious acute illness from which she suffers which has caused her mental status change.  Given her improvement at  this time we would continue to focus on supportive care and NG tube feeds, as further work-up can be facilitated at an outpatient procedure or other facility at this time.  Hopefully over the next 24 to 48 hours patient can be reevaluated by PT and speech therapy with further engagement and recommendations about advancement of diet if safe.  Over the past 72 hours patient's mental status has minimally improved, over the previous week patient has only able to moan/calling out in pain.  Patient now able to shake her head yes or no to simple yes/no questions which seems to be appropriate, otherwise verbal status continues to be limited to weak 1 word answers which are quite unintelligible for the most part. Patient does have ongoing NG tube with medications and food per daughter's wishes. Patient has had extensive work-up as below including imaging, labs all of which essentially have been generally unremarkable for overt cause of patient's mental and physical decline. Patient recently treated for questionable UTI given febrile events and mental status changes in hopes that this will improve her status although very minimal if any improvement, patient continues to have markedly poor p.o. intake and likely concurrent depression ongoing. Patient is an advanced age, appears severely malnourished at this point -not consistent with any acute dietary changes but appears to be more chronic, previously refusing all medications and management although daughter continues to request ongoing treatment, multiple consults as well as ongoing evaluation despite extensive negative work-up as below. Per discussion with palliative care as well as GI it appears the patient's daughter is unfortunately unable to cope with the fact that her mother is actively dying. We continue to follow along closely, offer what support we can, ethics has been consulted given inappropriate requests by daughter for consults and treatment which in my  opinion would  be futile (requesting at one point for obgyn to evaluate for possible exam to rule out vaginal cancer which we discussed wouldn't likely be tolerated - patient then asked for blood work to rule out GU cancers -which certainly would not have any meaningful impact on her current disposition).  Additionally patient continues to be somewhat agitated, unclear if patient is in pain or simply agitated due to her worsening mental status, but daughter is requesting we hold off on any further pain medication or anxiolytics.  We continue to recommend to focus on comfort measures and comfort care at this time, but daughter continues to request further workup  I am doubtful that any further imaging, lab work or procedures at this time will change our trajectory.  We unfortunately do not have a working diagnosis for patient's decline other than markedly poor p.o. intake in the setting of likely depression other than possible UTI given recent fever and patient's mental status changes, but have now been treated appropriately with minimal improvement in patient's status.  Patient's daughter also continues to be fixated on single dose of 0.25mg  lorazepam and 0.5mg  of morphine on night shift on 01/04/19 and 01/05/19 respectively that she believes has caused her mother's current nonverbal condition despite our explanation that this medication would have likely cleared within a few hours of administration.  At this time patient's outlook remains grim, patient remains a full code despite discussion with family as patient had previously noted " I want everything done" which at this time precludes any further discussion about code/palliative/hospice.  Patient's mental status remains remarkably poor, patient now completed antibiotic coverage for questionable UTI.  Recent repeat abdominal imaging remarkable for ascites only, not surprising given patient's very low albumin level again in the setting of poor p.o. intake.  Patient  remains high risk for worsening mental status, aspiration, elevated risk of morbidity mortality given advanced age, chronic comorbid conditions and above.   Subjective: 9/17 alert nods yes and no to questions either cannot or refuses to verbalize answers.  Follows commands but extremely weak (raised arms and wiggle toes to command).  Last 24 hours afebrile   Assessment & Plan:   Principal Problem:   Adult failure to thrive Active Problems:   Pancytopenia (HCC)   Hyponatremia   Protein calorie malnutrition (HCC)   History of TIA (transient ischemic attack)   Generalized weakness   Pressure injury of skin   Transaminitis   Malnutrition of moderate degree   Evaluation by psychiatric service required  Failure to thrive in adult/severe protein calorie malnutrition/hypoalbuminemia - CT head negative for acute abnormality see results below -MRI brain negative acute stroke see results below - unclear etiology but possible component of depression.  See depression below - PT/OT recommends SNF.  Daughter declined - Poor oral intake during hospitalization - 9/8 core track placed.  Tube feedings at goal - Per EMR and hospital staff patient's interaction with myself the best they have seen her since admission.  Hx TIA - Has not received Plavix since 12/24/2018 patient refusing. -Continue aspirin  Acute metabolic encephalopathy -Multifactorial failure to thrive, infection, shock liver, metabolic derangement -0000000 appears to be improving slightly patient shakes her head yes and no to questions follows commands though refuses/cannot verbalize - Minimize sedating medication  Shock liver/elevated LFTs - GI following - Unclear etiology - AST/ALT minimally improved but still elevated - Hepatitis panel negative, EBV and CMV IgM negative - CT abdomen and pelvis negative evidence of acute intra-abdominal/pelvic abnormality.  Diffuse fluid  within the colon suggestive of enteritis vs diarrheal  illness (negative abdominal tenderness, or other GI symptoms) - Alkaline phosphatase, AST, ALT rising.  Most likely not secondary to antibiotics as they were discontinued 9/13.  Will continue to monitor along with GI - RUQ ultrasound showed gallbladder sludge see below - KUB on 12/29/2018 with mild diffuse gaseous prominence without obstructive pattern.  - Suspicion for possible mild shock liver given she was hypotensive on presentation.   - GI also believes that this transaminitis could reflect from skeletal muscle release as well - CK, MB minimally elevated -but improving - GI following, repeat CT on 01/06/2019 remarkable for ascites -Discussed case with GI plan is for them to offer patient possible MRCP vs liver biopsy on 9/18.  Very risky given patient's current condition   Major depressive disorder - Recent loss of sister 2 months ago with extreme ataxia and failure to thrive, daughter continues to think that this therapy is completely wrong. -Psychiatry consulted recommended initiation of antidepressant therapy.  Patient nor daughter interested in initiating treatment.  UTI/yeast infection in groin and oral thrush -Initially started on antibiotics but discontinued after urine culture showed insignificant growth - nystatin topical powder to groin, nystatin oral for p.o. thrush - ID following patient  Leukopenia - Resolved  Thrombocytopenia -Most likely secondary to patient's liver dysfunction.  Currently no signs or symptoms of occult bleed - Started on vitamin B12 parenterally as she has had low levels of vitamin B12 during last admission -No further work-up indicated, patient can follow-up in outpatient setting with indicated  AKI -Resolved  Hyponatremia -Resolved  Hypokalemia -Resolved  Hypomagnesmia  Sacral cubitus ulcer stage II - Continue care per wound care recommendations     Ongoing episodes of somewhat loose stool in the setting of tube feeds  Cdiff ruled  out - Patient started having increased frequency of somewhat loose stool while on tube feeds, cdiff testing negative - consider decreasing tube feeding rate, increasing calories accordingly, defer to dietary - Patient now febrile as above - fever of 102 on 01/05/2019 x1 - Previous history of C. difficile colitis back in 2018 in which she received a fecal transplant - Patient was covered by vancomycin per ID guidelines being treated for UTI as above  Odynophagia with possible oropharyngeal candidiasis - Unable to use Diflucan because of increased LFTs.Nystatin orally has been ordered but patient previously refusing all meds, continue to dose as allowed. -Speech to reevaluate for possible advancement of diet  Generalized deconditioning/failure to thrive, POA - PT recommends SNF placement - Daughter thinks that patient will get better and will be able to go home and declines SNF placement.  Ethics: - Patient now re-presenting after recent hospitalization, was home for roughly 1-2 days and returned with worsening weakness, severe dehydration, and adult failure to thrive.  Daughter believes that she just will require some nutrition through artificial means to get her back to her previous baseline. She states that her mother was independent with her ADLs and cooking for herself prior to admission (unclear timeframe).  Daughter states that her current decline was acute and only started on 12/28/2018; which is contrary to all nursing reports and previous provider reports during her care during this hospitalization (patient weighed 45kg over a month ago -admitted at 48 kg).  Daughter seems to be struggling with the notion that her mother is in the active dying process in regards to her adult failure to thrive in which she is refusing most if not all interventions  and has not been eating or drinking now for weeks. - Given this patient's advanced age, repeat hospitalization in less than a few days from previous  discharge, patient's prognosis is grim, and this has been relayed to the patient's daughter on multiple occasions by multiple providers. - Ethics has been consulted, following along, we appreciate insight recommendations on this difficult case     DVT prophylaxis: Lovenox/SCD Code Status: Full Family Communication: None Disposition Plan: TBD   Consultants:  ID GI Hematology Palliative care   Procedures/Significant Events:     I have personally reviewed and interpreted all radiology studies and my findings are as above.  VENTILATOR SETTINGS:    Cultures   Antimicrobials: Anti-infectives (From admission, onward)   Start     Stop   01/06/19 2200  cephALEXin (KEFLEX) 250 MG/5ML suspension 500 mg     01/08/19 2141   01/06/19 1045  ceFAZolin (ANCEF) IVPB 1 g/50 mL premix  Status:  Discontinued     01/06/19 1734   01/06/19 1000  vancomycin (VANCOCIN) 50 mg/mL oral solution 125 mg     01/08/19 1226   01/05/19 1200  cefTRIAXone (ROCEPHIN) 1 g in sodium chloride 0.9 % 100 mL IVPB  Status:  Discontinued     01/06/19 1037       Devices    LINES / TUBES:  Core track placed 9/8>>>    Continuous Infusions:  sodium chloride     dextrose 5 % and 0.45% NaCl 10 mL/hr at 01/05/19 1700   feeding supplement (OSMOLITE 1.2 CAL) 1,000 mL (01/11/19 2301)     Objective: Vitals:   01/11/19 0432 01/11/19 1900 01/11/19 2219 01/12/19 0404  BP: 134/76  129/88 (!) 162/83  Pulse: (!) 105 100 (!) 102 99  Resp: (!) 23 (!) 24 20 20   Temp: 98.6 F (37 C) 98 F (36.7 C) 98.9 F (37.2 C) 99.3 F (37.4 C)  TempSrc: Oral Oral Oral Oral  SpO2: 99% 100% 98% 98%  Weight:      Height:        Intake/Output Summary (Last 24 hours) at 01/12/2019 [id number] Last data filed at 01/12/2019 [id number] Gross per 24 hour  Intake 1685.75 ml  Output 500 ml  Net 1185.75 ml   Filed Weights   12/26/18 0500 01/03/19 0500 01/04/19 0500  Weight: 61 kg 61 kg 61 kg    Examination:  General: Alert,  nods yes and no to questions, no acute respiratory distress, cachectic Eyes: negative scleral hemorrhage, negative anisocoria, negative icterus ENT: Negative Runny nose, negative gingival bleeding, Neck:  Negative scars, masses, torticollis, lymphadenopathy, JVD Lungs: Clear to auscultation bilaterally without wheezes or crackles Cardiovascular: Regular rate and rhythm without murmur gallop or rub normal S1 and S2 Abdomen: negative abdominal pain, nondistended, positive soft, bowel sounds, no rebound, no ascites, no appreciable mass Extremities: No significant cyanosis, clubbing, or edema bilateral lower extremities Skin: Negative rashes, lesions, ulcers Psychiatric: Unable to evaluate patient will not verbalize answers.   Central nervous system:  Cranial nerves II through XII intact, tongue/uvula midline, all extremities muscle strength 2/5, sensation intact throughout, negative expressive aphasia, negative receptive aphasia.  .     Data Reviewed: Care during the described time interval was provided by me .  I have reviewed this patient's available data, including medical history, events of note, physical examination, and all test results as part of my evaluation.   CBC: Recent Labs  Lab 01/08/19 0336 01/09/19 0435 01/10/19 0114 01/11/19 0147 01/12/19 0239  WBC  14.5* 11.8* 7.7 5.8 5.5  HGB 11.6* 12.2 10.9* 10.7* 10.7*  HCT 32.2* 33.1* 30.7* 30.2* 29.0*  MCV 86.8 85.5 88.5 88.3 87.9  PLT 93* 68* 76* 82* 85*   Basic Metabolic Panel: Recent Labs  Lab 01/08/19 0336 01/09/19 0435 01/10/19 0114 01/11/19 0147 01/12/19 0239  NA 130* 134* 137 138 136  K 4.6 4.9 5.1 5.2* 5.0  CL 109 112* 114* 115* 112*  CO2 16* 17* 16* 17* 16*  GLUCOSE 109* 100* 108* 100* 110*  BUN 26* 28* 29* 24* 24*  CREATININE 1.06* 1.04* 0.91 0.86 0.76  CALCIUM 7.7* 8.0* 8.1* 8.3* 8.3*   GFR: Estimated Creatinine Clearance: 41.8 mL/min (by C-G formula based on SCr of 0.76 mg/dL). Liver Function  Tests: Recent Labs  Lab 01/08/19 0336 01/09/19 0435 01/10/19 0114 01/11/19 0147 01/12/19 0239  AST 189* 175* 147* 130* 169*  ALT 61* 53* 44 43 60*  ALKPHOS 444* 555* 615* 639* 728*  BILITOT 1.4* 1.7* 1.2 0.7 0.8  PROT 5.0* 5.3* 5.1* 4.9* 5.5*  ALBUMIN 1.8* 1.8* 1.6* 1.7* 1.7*   No results for input(s): LIPASE, AMYLASE in the last 168 hours. No results for input(s): AMMONIA in the last 168 hours. Coagulation Profile: Recent Labs  Lab 01/10/19 0844  INR 1.0   Cardiac Enzymes: Recent Labs  Lab 01/06/19 0306 01/09/19 1016  CKTOTAL 370* 150  CKMB 6.8*  --    BNP (last 3 results) No results for input(s): PROBNP in the last 8760 hours. HbA1C: No results for input(s): HGBA1C in the last 72 hours. CBG: Recent Labs  Lab 01/08/19 0744 01/09/19 0801 01/10/19 0835 01/11/19 0808 01/12/19 0751  GLUCAP 74 96 96 90 106*   Lipid Profile: No results for input(s): CHOL, HDL, LDLCALC, TRIG, CHOLHDL, LDLDIRECT in the last 72 hours. Thyroid Function Tests: No results for input(s): TSH, T4TOTAL, FREET4, T3FREE, THYROIDAB in the last 72 hours. Anemia Panel: No results for input(s): VITAMINB12, FOLATE, FERRITIN, TIBC, IRON, RETICCTPCT in the last 72 hours. Urine analysis:    Component Value Date/Time   COLORURINE YELLOW 01/08/2019 2015   APPEARANCEUR HAZY (A) 01/08/2019 2015   LABSPEC 1.018 01/08/2019 2015   PHURINE 6.0 01/08/2019 2015   GLUCOSEU NEGATIVE 01/08/2019 2015   HGBUR SMALL (A) 01/08/2019 2015   BILIRUBINUR NEGATIVE 01/08/2019 2015   KETONESUR NEGATIVE 01/08/2019 2015   PROTEINUR NEGATIVE 01/08/2019 2015   UROBILINOGEN 0.2 07/28/2012 1048   NITRITE NEGATIVE 01/08/2019 2015   LEUKOCYTESUR NEGATIVE 01/08/2019 2015   Sepsis Labs: @LABRCNTIP (procalcitonin:4,lacticidven:4)  ) Recent Results (from the past 240 hour(s))  C difficile quick scan w PCR reflex     Status: None   Collection Time: 01/03/19  5:36 PM   Specimen: Stool  Result Value Ref Range Status   C  Diff antigen NEGATIVE NEGATIVE Final   C Diff toxin NEGATIVE NEGATIVE Final   C Diff interpretation No C. difficile detected.  Final    Comment: Performed at [facility], [place] [address], [place], [place] [address]  Gastrointestinal Panel by PCR , Stool     Status: None   Collection Time: 01/03/19  5:36 PM   Specimen: Stool  Result Value Ref Range Status   Campylobacter species NOT DETECTED NOT DETECTED Final   Plesimonas shigelloides NOT DETECTED NOT DETECTED Final   Salmonella species NOT DETECTED NOT DETECTED Final   Yersinia enterocolitica NOT DETECTED NOT DETECTED Final   Vibrio species NOT DETECTED NOT DETECTED Final   Vibrio cholerae NOT DETECTED NOT DETECTED  Final   Enteroaggregative E coli (EAEC) NOT DETECTED NOT DETECTED Final   Enteropathogenic E coli (EPEC) NOT DETECTED NOT DETECTED Final   Enterotoxigenic E coli (ETEC) NOT DETECTED NOT DETECTED Final   Shiga like toxin producing E coli (STEC) NOT DETECTED NOT DETECTED Final   Shigella/Enteroinvasive E coli (EIEC) NOT DETECTED NOT DETECTED Final   Cryptosporidium NOT DETECTED NOT DETECTED Final   Cyclospora cayetanensis NOT DETECTED NOT DETECTED Final   Entamoeba histolytica NOT DETECTED NOT DETECTED Final   Giardia lamblia NOT DETECTED NOT DETECTED Final   Adenovirus F40/41 NOT DETECTED NOT DETECTED Final   Astrovirus NOT DETECTED NOT DETECTED Final   Norovirus GI/GII NOT DETECTED NOT DETECTED Final   Rotavirus A NOT DETECTED NOT DETECTED Final   Sapovirus (I, II, IV, and V) NOT DETECTED NOT DETECTED Final    Comment: Performed at [facility], [address] [place], [place] [address]         Radiology Studies: No results found.      Scheduled Meds:  aspirin  81 mg Per Tube Daily   chlorhexidine  15 mL Mouth Rinse BID   folic acid  1 mg Per Tube Daily   Gerhardt's butt cream   Topical TID   latanoprost  1 drop Both Eyes QHS   mouth rinse  15 mL Mouth Rinse q12n4p   megestrol  400  mg Per Tube Daily   mirtazapine  30 mg Per NG tube QHS   multivitamin  15 mL Per Tube Daily   nystatin  5 mL Oral QID   nystatin   Topical TID   pantoprazole sodium  40 mg Per Tube Daily   sodium chloride flush  3 mL Intravenous Q12H   Continuous Infusions:  sodium chloride     dextrose 5 % and 0.45% NaCl 10 mL/hr at 01/05/19 1700   feeding supplement (OSMOLITE 1.2 CAL) 1,000 mL (01/11/19 2301)     LOS: 23 days   The patient is critically ill with multiple organ systems failure and requires high complexity decision making for assessment and support, frequent evaluation and titration of therapies, application of advanced monitoring technologies and extensive interpretation of multiple databases. Critical Care Time devoted to patient care services described in this note  Time spent: 40 minutes     Giah Fickett, Geraldo Docker, MD [facility] Pager [phone]  If 7PM-7AM, please contact night-coverage [email or web address] Password [facility] 01/12/2019, 8:28 AM

## 2019-01-12 NOTE — Progress Notes (Signed)
  Speech Language Pathology Treatment: Dysphagia  Patient Details Name: Sydney [name] MRN: [id number] DOB: 07-20-1930 Today's Date: 01/12/2019 Time: [id number] SLP Time Calculation (min) (ACUTE ONLY): 23 min  Assessment / Plan / Recommendation Clinical Impression  Patient's daughter present for session. With encouragement from daughter, pt's oral cavity was inspected. Oral mucosa was pink and intact. She is currently being treated for oral thrush, however she continue to refuse oral PO and complains of a sore throat. Her pain is is 5/10 in her throat. After much encouragement she agreed to take a small sip of water. She was did not initiate a swallow and allowed the water to anteriorly spill from her mouth. On a second attempt she swallowed a very small 1cc amount with a grimace and complaint "it hurts". MD may want to consider continued treatment for Pharyngeal thrush (one that doesn't require swallowing) to address pain in throat. Recommend continuing current diet. Daughter educated about recommendations. Recommend bland foods/liquids as she will tolerate those best. ST will check diet tolerance.    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  CT of the abdomen and pelvis was negative for acute intra-abdominal or pelvic abnormality.  She was found to have mildly elevated LFTs along with hyponatremia and pancytopenia.  Pt is currently on Dys 3 plus supplements.  She has had worsening appetite since 11/13/22; recent death of her sister has led to concerns for depression. MRI brain negative.  Pt with protein cal malnutrition, FTT; prognosis guarded to poor per MD notes. No new chest imaging since 8/27 evaluation.  Palliative and ethics have been consulted, pt remains full code  and has NG at present      [place] with current plan of care       Recommendations  Diet recommendations: Dysphagia 3 (mechanical soft);Thin liquid Liquids provided via: Cup Medication Administration: Via alternative means Supervision: Trained caregiver to feed patient Compensations: Minimize environmental distractions;Slow rate;Small sips/bites Postural Changes and/or Swallow Maneuvers: Seated upright 90 degrees                Oral Care Recommendations: Oral care QID Plan: Continue with current plan of care       Gordon, MA, CCC-SLP 01/12/2019 2:23 PM

## 2019-01-12 NOTE — Consult Note (Signed)
[facility] Nurse wound consult note Patient receiving care in [place]. Consult completed remotely after discussion with primary RN, Reginold Agent and review of record. Reason for Consult: sacral ulcer Wound type: stage 2 Pressure Injury POA: Yes Measurement: see flowsheet Wound bed: pink Drainage (amount, consistency, odor) none Periwound: slightly macerated/white Dressing procedure/placement/frequency:  Place a small piece of calcium alginate Kellie Simmering ([phone]) over the wound on the sacrum/coccyx.  Then top with a foam dressing.  Change every other day. Monitor the wound area(s) for worsening of condition such as: Signs/symptoms of infection,  Increase in size,  Development of or worsening of odor, Development of pain, or increased pain at the affected locations.  Notify the medical team if any of these develop.  Thank you for the consult.  Discussed plan of care with the bedside nurse.  [place] nurse will not follow at this time.  Please re-consult the [place] team if needed.  [name], RN, MSN, CWOCN, CNS-BC, pager [phone]

## 2019-01-12 NOTE — Progress Notes (Addendum)
ANTICOAGULATION CONSULT NOTE - Initial Consult  Pharmacy Consult for Lovenox Indication: VTE prophylaxis  Allergies  Allergen Reactions  . Demerol [Meperidine] Other (See Comments)    Excessive sweating, cramping  . Other     No Antibiotic without consultation with her primary physician.  . Codeine Rash  . Sulfa Antibiotics Itching and Other (See Comments)    Reaction unknown  . Tomato Itching    Patient Measurements: Height: 5\' 3"  (160 cm) Weight: 134 lb 7.7 oz (61 kg) IBW/kg (Calculated) : 52.4  Vital Signs: Temp: 98.5 F (36.9 C) (09/17 1558) Temp Source: Oral (09/17 1558) BP: 128/75 (09/17 1558) Pulse Rate: 99 (09/17 1558)  Labs: Recent Labs    01/10/19 0114 01/10/19 0844 01/11/19 0147 01/12/19 0239  HGB 10.9*  --  10.7* 10.7*  HCT 30.7*  --  30.2* 29.0*  PLT 76*  --  82* 85*  LABPROT  --  12.6  --   --   INR  --  1.0  --   --   CREATININE 0.91  --  0.86 0.76    Estimated Creatinine Clearance: 41.8 mL/min (by C-G formula based on SCr of 0.76 mg/dL).   Medical History: Past Medical History:  Diagnosis Date  . Anemia    years ago  . Aortic atherosclerosis ([place])   . Arthritis    "left hand" (02/17/2013)  . Bilateral inguinal hernia    INCIDENTAL CT 1/20  . C. difficile diarrhea   . Cataract   . Dehydration 12/29/2018  . Diplopia    CRANIAL 6 NERVE PALSY  . Dizzy   . Dyslipidemia   . Gastritis   . Glaucoma   . High cholesterol    "on RX years ago" (02/17/2013)  . Hypertension   . Leukopenia   . Meningioma ([place]) 05/01/2015  . Osteoporosis 02/2014   T score -2.5  followed by Dr. Lysle Rubens  . Palpitations    PACs, PVCs and short runs of atrial tachycardia on Holter monitoring  . PVD (peripheral vascular disease) ([place])   . Rhinitis   . Syncopal episodes 2003  . TIA (transient ischemic attack) 1990's  . Trigger thumb of left hand   . Weakness 12/29/2018  . Winter itch     Assessment: 85 yr old female with failure to thrive/severe protein  calorie malnutrition/ hypoalbuminemia, hx TIA, acute metabolic encephalopathy, shock liver/elevated LFTs, major depressive disorder, UTI/yeast infection in groin, oral thrush, leukopenia, thrombocytopenia (stable, likely related to liver dysfunction), AKI (resolved), hx low Na/K/Mg, sacral decubitus ulcer stage II, ongoing episodes of loose stools, odynophagia, general deconditioning. Pharmacy has been consulted to dose/monitor Lovenox for VTE prophylaxis.  H/H 10.7/29.0, stable; platelets 85K, stable; Scr 0.76, stable; TBW CrCl ~51 ml/min using Scr 0.76 (likely overestimation of renal function, given low muscle mass); INR 1  Goal of Therapy:  Prevention of VTE Monitor platelets by anticoagulation protocol: Yes   Plan:  Lovenox 40 mg SQ daily Monitor renal function, CBC, signs/symptoms of bleeding  Gillermina Hu, PharmD, BCPS, [facility] Clinical Pharmacist 01/12/2019,7:10 PM

## 2019-01-13 ENCOUNTER — Inpatient Hospital Stay (HOSPITAL_COMMUNITY): Payer: Medicare Other

## 2019-01-13 DIAGNOSIS — E538 Deficiency of other specified B group vitamins: Secondary | ICD-10-CM | POA: Diagnosis not present

## 2019-01-13 DIAGNOSIS — L89002 Pressure ulcer of unspecified elbow, stage 2: Secondary | ICD-10-CM

## 2019-01-13 DIAGNOSIS — F321 Major depressive disorder, single episode, moderate: Secondary | ICD-10-CM

## 2019-01-13 DIAGNOSIS — E860 Dehydration: Secondary | ICD-10-CM | POA: Diagnosis not present

## 2019-01-13 DIAGNOSIS — R945 Abnormal results of liver function studies: Secondary | ICD-10-CM | POA: Diagnosis not present

## 2019-01-13 DIAGNOSIS — R627 Adult failure to thrive: Secondary | ICD-10-CM | POA: Diagnosis not present

## 2019-01-13 LAB — BASIC METABOLIC PANEL
Anion gap: 5 (ref 5–15)
BUN: 23 mg/dL (ref 8–23)
CO2: 19 mmol/L — ABNORMAL LOW (ref 22–32)
Calcium: 8.5 mg/dL — ABNORMAL LOW (ref 8.9–10.3)
Chloride: 110 mmol/L (ref 98–111)
Creatinine, Ser: 0.8 mg/dL (ref 0.44–1.00)
GFR calc Af Amer: >60 mL/min (ref >60)
GFR calc non Af Amer: >60 mL/min (ref >60)
Glucose, Bld: 99 mg/dL (ref 70–99)
Potassium: 4 mmol/L (ref 3.5–5.1)
Sodium: 134 mmol/L — ABNORMAL LOW (ref 135–145)

## 2019-01-13 LAB — CBC WITH DIFFERENTIAL/PLATELET
Abs Immature Granulocytes: 0.11 10*3/uL — ABNORMAL HIGH (ref 0.00–0.07)
Basophils Absolute: 0 10*3/uL (ref 0.0–0.1)
Basophils Relative: 0 %
Eosinophils Absolute: 0 10*3/uL (ref 0.0–0.5)
Eosinophils Relative: 0 %
HCT: 30.9 % — ABNORMAL LOW (ref 36.0–46.0)
Hemoglobin: 10.8 g/dL — ABNORMAL LOW (ref 12.0–15.0)
Immature Granulocytes: 2 %
Lymphocytes Relative: 10 %
Lymphs Abs: 0.8 10*3/uL (ref 0.7–4.0)
MCH: 30.9 pg (ref 26.0–34.0)
MCHC: 35 g/dL (ref 30.0–36.0)
MCV: 88.5 fL (ref 80.0–100.0)
Monocytes Absolute: 0.7 10*3/uL (ref 0.1–1.0)
Monocytes Relative: 9 %
Neutro Abs: 6 10*3/uL (ref 1.7–7.7)
Neutrophils Relative %: 79 %
Platelets: 94 10*3/uL — ABNORMAL LOW (ref 150–400)
RBC: 3.49 MIL/uL — ABNORMAL LOW (ref 3.87–5.11)
RDW: 16.6 % — ABNORMAL HIGH (ref 11.5–15.5)
WBC: 7.5 10*3/uL (ref 4.0–10.5)
nRBC: 0.5 % — ABNORMAL HIGH (ref 0.0–0.2)

## 2019-01-13 LAB — COMPREHENSIVE METABOLIC PANEL
ALT: 73 U/L — ABNORMAL HIGH (ref 0–44)
AST: 183 U/L — ABNORMAL HIGH (ref 15–41)
Albumin: 1.9 g/dL — ABNORMAL LOW (ref 3.5–5.0)
Alkaline Phosphatase: 751 U/L — ABNORMAL HIGH (ref 38–126)
Anion gap: 7 (ref 5–15)
BUN: 22 mg/dL (ref 8–23)
CO2: 17 mmol/L — ABNORMAL LOW (ref 22–32)
Calcium: 8.3 mg/dL — ABNORMAL LOW (ref 8.9–10.3)
Chloride: 110 mmol/L (ref 98–111)
Creatinine, Ser: 0.79 mg/dL (ref 0.44–1.00)
GFR calc Af Amer: >60 mL/min (ref >60)
GFR calc non Af Amer: >60 mL/min (ref >60)
Glucose, Bld: 117 mg/dL — ABNORMAL HIGH (ref 70–99)
Potassium: 4.6 mmol/L (ref 3.5–5.1)
Sodium: 134 mmol/L — ABNORMAL LOW (ref 135–145)
Total Bilirubin: 1 mg/dL (ref 0.3–1.2)
Total Protein: 5.7 g/dL — ABNORMAL LOW (ref 6.5–8.1)

## 2019-01-13 LAB — LIPID PANEL
Cholesterol: 95 mg/dL (ref 0–200)
HDL: 14 mg/dL — ABNORMAL LOW (ref >40)
LDL Cholesterol: 59 mg/dL (ref 0–99)
Total CHOL/HDL Ratio: 6.8 RATIO
Triglycerides: 109 mg/dL (ref <150)
VLDL: 22 mg/dL (ref 0–40)

## 2019-01-13 LAB — MAGNESIUM
Magnesium: 1.7 mg/dL (ref 1.7–2.4)
Magnesium: 2.3 mg/dL (ref 1.7–2.4)

## 2019-01-13 LAB — PHOSPHORUS
Phosphorus: 1.5 mg/dL — ABNORMAL LOW (ref 2.5–4.6)
Phosphorus: 2 mg/dL — ABNORMAL LOW (ref 2.5–4.6)

## 2019-01-13 LAB — GLUCOSE, CAPILLARY: Glucose-Capillary: 80 mg/dL (ref 70–99)

## 2019-01-13 MED ORDER — NYSTATIN 100000 UNIT/ML MT SUSP
5.0000 mL | Freq: Every day | OROMUCOSAL | Status: DC
  Administered 2019-01-13 – 2019-01-31 (×42): 500000 [IU] via ORAL
  Filled 2019-01-13 (×53): qty 5

## 2019-01-13 MED ORDER — MAGNESIUM SULFATE 2 GM/50ML IV SOLN
2.0000 g | Freq: Once | INTRAVENOUS | Status: AC
  Administered 2019-01-13: 13:00:00 2 g via INTRAVENOUS
  Filled 2019-01-13: qty 50

## 2019-01-13 MED ORDER — COLLAGENASE 250 UNIT/GM EX OINT
TOPICAL_OINTMENT | Freq: Every day | CUTANEOUS | Status: DC
  Administered 2019-01-13: 12:00:00 via TOPICAL
  Administered 2019-01-14: 1 via TOPICAL
  Administered 2019-01-15 – 2019-01-18 (×4): via TOPICAL
  Administered 2019-01-19: 1 via TOPICAL
  Administered 2019-01-20 – 2019-01-24 (×5): via TOPICAL
  Filled 2019-01-13: qty 30

## 2019-01-13 NOTE — Progress Notes (Signed)
Pts daughter, [name], talking to Dr. Sherral Hammers on phone at [facility] request.

## 2019-01-13 NOTE — Progress Notes (Signed)
Progress Note   Subjective  I have seen and reviewed the patient this morning. Liver biochemical testing shows persistent cholestasis. Most recent INR is normal suggesting no evidence of liver failure. Patient this morning much more interactive and able to answer some questions with yes and no and also say good morning and goodbye.  This is the most interactive I have ever seen her.  She is following simple commands as well. Overnight had concern for feeds coming back up her mouth and her feeding tube was stopped. Chest x-ray pending this morning.   Objective  Vital signs in last 24 hours: Temp:  [98.5 F (36.9 C)-99.6 F (37.6 C)] 99.6 F (37.6 C) (09/18 0357) Pulse Rate:  [99-109] 109 (09/18 0357) Resp:  [20] 20 (09/18 0357) BP: (128-153)/(75-86) 147/86 (09/18 0357) SpO2:  [97 %-99 %] 99 % (09/18 0357) Last BM Date: 01/12/19 General: Resting in bed with Cortrak in place Neuro: She is oriented to place but cannot tell me date or situation, able to follow simple commands and answer most yes/no questions Heart: Non-tachycardic Lungs: No wheezing present Abdomen: Soft, protuberant, no discomfort upon palpation   Extremities: Lower extremity edema present bilaterally    Intake/Output from previous day: 09/17 0701 - 09/18 0700 In: 0962 [NG/GT:1074] Out: 200 [Urine:200] Intake/Output this shift: No intake/output data recorded.  Lab Results: Recent Labs    01/11/19 0147 01/12/19 0239 01/13/19 0256  WBC 5.8 5.5 7.5  HGB 10.7* 10.7* 10.8*  HCT 30.2* 29.0* 30.9*  PLT 82* 85* 94*   BMET Recent Labs    01/12/19 0239 01/12/19 1939 01/13/19 0256  NA 136 136 134*  K 5.0 4.4 4.6  CL 112* 113* 110  CO2 16* 17* 17*  GLUCOSE 110* 110* 117*  BUN 24* 25* 22  CREATININE 0.76 0.80 0.79  CALCIUM 8.3* 8.2* 8.3*   LFT Recent Labs    01/13/19 0256  PROT 5.7*  ALBUMIN 1.9*  AST 183*  ALT 73*  ALKPHOS 751*  BILITOT 1.0   PT/INR No results for input(s): LABPROT,  INR in the last 72 hours.  Studies/Results: Pending    Assessment / Plan:   This is a 85 y.o. female with pmh significant for arthritis, glaucoma, hyperlipidemia, osteoporosis, PVD, prior C. difficile infection status post FMT who was admitted with failure to thrive and liver biochemical tests abnormalities.  From Wednesday to today I have seen a significant progression in the patient's mental status.  I am happy that this is occurring.  The patient's liver biochemical profile continues to show cholestasis.  Last cross-sectional imaging did not suggest any biliary ductal dilation.  However with the progressive cholestasis I do think it would be worthwhile to repeat imaging.  I would like to pursue a liver Doppler ultrasound which will not require any sedation for her to have this done and to grossly know whether the portal venous flow is okay as well as ensure that the bile duct is not dilated.  If there is abnormalities noted on this then we will have to strongly consider the role of an MRI/MRCP.  Liver biopsy may be indicated at some point in time, although he would give information it may not give a definitive diagnosis.  I still suspect that the alk phos elevation and cholestasis is a result of Keflex but will have to see how she does.  To consider a MRI or liver biopsy would likely require the role of sedation for the patient and I am  not sure that with her current clinical status having improved.  I had an opportunity to talk with Dr. Sherral Hammers and also had an opportunity to talk with the patient's daughter for approximately 20 minutes this morning and went over her labs on the computer and my reasoning and thinking.  We are holding on a liver biopsy for now.  If he gets to a point where that is left and only other test then it is a risk that the patient's daughter will have to accept (slight increased risk of issues as a result of ascites unless a transjugular approach is presented and the risks of  sedation the go along with percutaneous or transjugular biopsy.  We will see how the x-ray of the chest and abdomen show if the NG tube/core track needs to be replaced or removed.  Otherwise if patient is doing okay could consider trying to restart tube feeds this afternoon.  Went over with the patient's daughter the role of nutrition to optimize her albumin state in effort of trying to help with third spacing.  I want to consider the use of fluconazole however with her liver test abnormalities I feel a little bit uncomfortable with that since the cholestasis persists and this may cause issues.  Could consider the use of nystatin swish and swallow 15 mL (300,000 units 5 times daily) or clotrimazole atrocious 5 times daily.  I am not sure she is ready for either of those at this point either.  Patient's daughter concerned about the longevity of keeping the NG tube in place for more than 2 weeks and I share her concern about the issues that can develop in regards to esophagitis and sinus issues however I think if she feels uncomfortable with this then we will need to ask our interventional radiology colleagues to consider the role of a PEG for the patient if an NG tube/core track is not accepted by the patient's daughter.  Hopefully the patient continues to wake up and be more responsive and able to be a part of her care and take her medicines and eat but until adequate nutrition I think she needs the core track.   Please call if any new questions.    Justice Britain, MD [facility] Office # [phone]

## 2019-01-13 NOTE — Progress Notes (Signed)
SLP Cancellation Note  Patient Details Name: Sydney [name] MRN: [id number] DOB: Jul 04, 1930   Cancelled treatment:        Pt refusal of PO trials. She turns head, tightly pressed lips together and moans. Daughter is not present. Will continue attempts to see pt.    Charlynne Cousins Mikkel Charrette, MA, CCC-SLP 01/13/2019 9:53 AM

## 2019-01-13 NOTE — Progress Notes (Addendum)
Pt started spitting up cream colored material. Put TF on hold. VSS. Lungs sounds diminished but otherwise clear. Made NP aware.

## 2019-01-13 NOTE — Care Management Important Message (Signed)
Important Message  Patient Details  Name: Sydney [name] MRN: [id number] Date of Birth: Apr 10, 1931   Medicare Important Message Given:  Yes     [name] 01/13/2019, 4:34 PM

## 2019-01-13 NOTE — Consult Note (Signed)
[facility] Nurse wound follow up Patient receiving care in [place].  Assisted with assessment by primary RN, Sharee Pimple, WTA. Wound type: unstageable to coccyx Measurement: 1.3 cm x 1.8 cm.  There is also a pin hole opening just above this wound. Wound bed: 100% yellow Drainage (amount, consistency, odor) none Periwound: darkened, may represent DTPI to surrounding tissue that may merge with the coccyx wound and evolve into an unstageable wound. Dressing procedure/placement/frequency: Apply Santyl to coccyx in a nickel thick layer. Cover with a saline moistened gauze, then foam dressing.  Change daily. Prevaolon boots bilaterally. Keep patient off of coccyx. Use ONLY ONE DermaTherapy pad beneath patient's buttocks. Do NOT use disposable pads. Patient on a low air loss mattress. Monitor the wound area(s) for worsening of condition such as: Signs/symptoms of infection,  Increase in size,  Development of or worsening of odor, Development of pain, or increased pain at the affected locations.  Notify the medical team if any of these develop.  Thank you for the consult.  Discussed plan of care with the bedside nurse.   [name], RN, MSN, CWOCN, CNS-BC, pager [phone]

## 2019-01-13 NOTE — Progress Notes (Signed)
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 CT of the abdomen and pelvis was negative for acute intra-abdominal or pelvic abnormality. She was found to have mildly elevated LFTs along with hyponatremia and pancytopenia. She was given IV fluids.ID and GI were consulted. Palliative care was subsequently consulted for goals of care discussion at admission.  Multidisciplinary meeting today with medicine, administration, palliative care, GI, nursing staff, patient, patient's pastor, and patient advocate.  Lengthy discussion about patient's ongoing, albeit minimal, improvement with NG tube feeds status post antibiotics and supportive care.  Daughter remains some frustrated given no clear precipitating diagnosis which makes prognosis/timeline somewhat difficult to define.  Patient's daughter is requesting transfer to a different facility for possible further evaluation and work-up.  We discussed that given our extensive work-up here the only remaining evaluations left are MRCP, endoscopy and possibly liver biopsy, all of these would require likely anesthesia/sedation and risks would likely outweigh benefits at this point given patient's advanced age and weakened condition given poor p.o. intake.  Given the patient previously refused disposition to SNF and pending possible transition to outside facility, our other option for disposition would ultimately be home with daughter to continue tube feeds and supportive care as she sees fit.  At this time patient has no obvious acute illness from which she suffers which has caused her mental status change.  Given her improvement at  this time we would continue to focus on supportive care and NG tube feeds, as further work-up can be facilitated at an outpatient procedure or other facility at this time.  Hopefully over the next 24 to 48 hours patient can be reevaluated by PT and speech therapy with further engagement and recommendations about advancement of diet if safe.  Over the past 72 hours patient's mental status has minimally improved, over the previous week patient has only able to moan/calling out in pain.  Patient now able to shake her head yes or no to simple yes/no questions which seems to be appropriate, otherwise verbal status continues to be limited to weak 1 word answers which are quite unintelligible for the most part. Patient does have ongoing NG tube with medications and food per daughter's wishes. Patient has had extensive work-up as below including imaging, labs all of which essentially have been generally unremarkable for overt cause of patient's mental and physical decline. Patient recently treated for questionable UTI given febrile events and mental status changes in hopes that this will improve her status although very minimal if any improvement, patient continues to have markedly poor p.o. intake and likely concurrent depression ongoing. Patient is an advanced age, appears severely malnourished at this point -not consistent with any acute dietary changes but appears to be more chronic, previously refusing all medications and management although daughter continues to request ongoing treatment, multiple consults as well as ongoing evaluation despite extensive negative work-up as below. Per discussion with palliative care as well as GI it appears the patient's daughter is unfortunately unable to cope with the fact that her mother is actively dying. We continue to follow along closely, offer what support we can, ethics has been consulted given inappropriate requests by daughter for consults and treatment which in my  opinion would  be futile (requesting at one point for obgyn to evaluate for possible exam to rule out vaginal cancer which we discussed wouldn't likely be tolerated - patient then asked for blood work to rule out GU cancers -which certainly would not have any meaningful impact on her current disposition).  Additionally patient continues to be somewhat agitated, unclear if patient is in pain or simply agitated due to her worsening mental status, but daughter is requesting we hold off on any further pain medication or anxiolytics.  We continue to recommend to focus on comfort measures and comfort care at this time, but daughter continues to request further workup  I am doubtful that any further imaging, lab work or procedures at this time will change our trajectory.  We unfortunately do not have a working diagnosis for patient's decline other than markedly poor p.o. intake in the setting of likely depression other than possible UTI given recent fever and patient's mental status changes, but have now been treated appropriately with minimal improvement in patient's status.  Patient's daughter also continues to be fixated on single dose of 0.25mg  lorazepam and 0.5mg  of morphine on night shift on 01/04/19 and 01/05/19 respectively that she believes has caused her mother's current nonverbal condition despite our explanation that this medication would have likely cleared within a few hours of administration.  At this time patient's outlook remains grim, patient remains a full code despite discussion with family as patient had previously noted " I want everything done" which at this time precludes any further discussion about code/palliative/hospice.  Patient's mental status remains remarkably poor, patient now completed antibiotic coverage for questionable UTI.  Recent repeat abdominal imaging remarkable for ascites only, not surprising given patient's very low albumin level again in the setting of poor p.o. intake.  Patient  remains high risk for worsening mental status, aspiration, elevated risk of morbidity mortality given advanced age, chronic comorbid conditions and above.   Subjective: 9/18 alert will answer yes and no to questions follows some commands.  Attempted to raise her arms when commanded and wiggled her toes upon command.  Still EXTREMELY emaciated and weak   Assessment & Plan:   Principal Problem:   Adult failure to thrive Active Problems:   Pancytopenia (HCC)   Hyponatremia   Protein calorie malnutrition (HCC)   History of TIA (transient ischemic attack)   Generalized weakness   Pressure injury of skin   Transaminitis   Malnutrition of moderate degree   Evaluation by psychiatric service required   MDD (major depressive disorder)  Failure to thrive in adult/severe protein calorie malnutrition/hypoalbuminemia* - CT head negative for acute abnormality see results below -MRI brain negative acute stroke see results below - unclear etiology but possible component of depression.  See depression below - PT/OT recommends SNF.  Daughter declined - Poor oral intake during hospitalization - 9/8 core track placed.  Tube feedings at goal - Per EMR and hospital staff patient's interaction with myself the best they have seen her since admission. -9/18 overnight patient had what sounded like residuals from her core track tube feedings held.  KUB ordered and pending.  Will restart feeds as soon as safe. - Hopefully patient's cognition will continue to improve however if does not at some point will need to consider PEG tube placement (next 7 to 14 days).  If patient to become a SNF candidate will require PEG tube.  Hx TIA - Has not received Plavix since 12/24/2018 patient refusing. -Continue aspirin  Acute metabolic encephalopathy -Multifactorial  failure to thrive, infection, shock liver, metabolic derangement -0000000 appears to be improving slightly patient shakes her head yes and no to questions  follows commands though refuses/cannot verbalize - Minimize sedating medication  Shock liver/elevated LFTs - GI following - Unclear etiology - AST/ALT minimally improved but still elevated - Hepatitis panel negative, EBV and CMV IgM negative - CT abdomen and pelvis negative evidence of acute intra-abdominal/pelvic abnormality.  Diffuse fluid within the colon suggestive of enteritis vs diarrheal illness (negative abdominal tenderness, or other GI symptoms) - Alkaline phosphatase, AST, ALT rising.  Most likely not secondary to antibiotics as they were discontinued 9/13.  Will continue to monitor along with GI - RUQ ultrasound showed gallbladder sludge see below - KUB on 12/29/2018 with mild diffuse gaseous prominence without obstructive pattern.  - Suspicion for possible mild shock liver given she was hypotensive on presentation.   - GI also believes that this transaminitis could reflect from skeletal muscle release as well - CK, MB minimally elevated -but improving - GI following, repeat CT on 01/06/2019 remarkable for ascites -9/18 discussed case with Dr. Justice [facility] GI who spoken to daughter and plan is for liver Doppler ultrasound.  If abnormalities noted on ultrasound will most likely proceed with MRI/MRCP and possibly a liver biopsy.  Major depressive disorder - Recent loss of sister 2 months ago with extreme ataxia and failure to thrive, daughter continues to think that this therapy is completely wrong. -Psychiatry consulted recommended initiation of antidepressant therapy.  Patient nor daughter interested in initiating treatment.  UTI/yeast infection in groin and oral thrush -Initially started on antibiotics but discontinued after urine culture showed insignificant growth - nystatin topical powder to groin - Nystatin swish and swallow 70ml  5 x per day or Clotrimazole atrocious 5 times daily - ID following patient - Hopefully in the next couple days patient's cognition will improve  enough that speech can officially evaluate patient in order to advance diet.  Leukopenia - Resolved  Thrombocytopenia -Most likely secondary to patient's liver dysfunction.  Currently no signs or symptoms of occult bleed - Started on vitamin B12 parenterally as she has had low levels of vitamin B12 during last admission -No further work-up indicated, patient can follow-up in outpatient setting with indicated -9/18 slight improvement  AKI -Resolved  Hyponatremia -Resolved  Hypokalemia -Resolved  Refeeding syndrome - Now that patient receiving nutrition continues to have metabolic derangements consistent with refeeding syndrome will need to watch closely all electrolytes.  Hypomagnesmia - Magnesium goal> 2 -Magnesium IV 2 g  Hypophosphatemia - Sodium phosphate 20 mmol - Recheck BMP, MG, Po4 1500  Sacral cubitus ulcer stage II - Continue care per wound care recommendations  Loose stools - Afebrile , negative leukocytosis, negative abdominal pain.  No indication for C. difficile work-up -NOTE; previous history of C. difficile colitis back in 2018 in which she received a fecal transplant - Patient was covered by vancomycin per ID guidelines being treated for UTI as above - No further reported loose stools per nursing staff.   Ethics: - Patient now re-presenting after recent hospitalization, was home for roughly 1-2 days and returned with worsening weakness, severe dehydration, and adult failure to thrive.  Daughter believes that she just will require some nutrition through artificial means to get her back to her previous baseline. She states that her mother was independent with her ADLs and cooking for herself prior to admission (unclear timeframe).  Daughter states that her current decline was acute and only started on 12/28/2018;  which is contrary to all nursing reports and previous provider reports during her care during this hospitalization (patient weighed 45kg over a month  ago -admitted at 48 kg).  Daughter seems to be struggling with the notion that her mother is in the active dying process in regards to her adult failure to thrive in which she is refusing most if not all interventions and has not been eating or drinking now for weeks. - Given this patient's advanced age, repeat hospitalization in less than a few days from previous discharge, patient's prognosis is grim, and this has been relayed to the patient's daughter on multiple occasions by multiple providers. - Ethics has been consulted, following along, we appreciate insight recommendations on this difficult case     DVT prophylaxis: Lovenox/SCD Code Status: Full Family Communication: 9/18 spoke with daughter at length and discussed plan of care answered all questions Disposition Plan: TBD   Consultants:  ID GI Hematology Palliative care   Procedures/Significant Events:     I have personally reviewed and interpreted all radiology studies and my findings are as above.  VENTILATOR SETTINGS:    Cultures   Antimicrobials: Anti-infectives (From admission, onward)   Start     Stop   01/06/19 2200  cephALEXin (KEFLEX) 250 MG/5ML suspension 500 mg     01/08/19 2141   01/06/19 1045  ceFAZolin (ANCEF) IVPB 1 g/50 mL premix  Status:  Discontinued     01/06/19 1734   01/06/19 1000  vancomycin (VANCOCIN) 50 mg/mL oral solution 125 mg     01/08/19 1226   01/05/19 1200  cefTRIAXone (ROCEPHIN) 1 g in sodium chloride 0.9 % 100 mL IVPB  Status:  Discontinued     01/06/19 1037       Devices    LINES / TUBES:  Core track placed 9/8>>>    Continuous Infusions:  sodium chloride     feeding supplement (OSMOLITE 1.2 CAL) 1,000 mL (01/12/19 2247)     Objective: Vitals:   01/12/19 0404 01/12/19 1558 01/12/19 2043 01/13/19 0357  BP: (!) 162/83 128/75 (!) 153/86 (!) 147/86  Pulse: 99 99 (!) 109 (!) 109  Resp: 20 20 20 20   Temp: 99.3 F (37.4 C) 98.5 F (36.9 C) 99.4 F (37.4 C)  99.6 F (37.6 C)  TempSrc: Oral Oral Oral Oral  SpO2: 98% 99% 97% 99%  Weight:      Height:        Intake/Output Summary (Last 24 hours) at 01/13/2019 0849 Last data filed at 01/13/2019 [id number] Gross per 24 hour  Intake 1074 ml  Output 200 ml  Net 874 ml   Filed Weights   12/26/18 0500 01/03/19 0500 01/04/19 0500  Weight: 61 kg 61 kg 61 kg   Physical Exam:  General: Alert, answers yes and no to questions, follows most commands, no acute respiratory distress, extremely cachectic Eyes: negative scleral hemorrhage, negative anisocoria, negative icterus ENT: Negative Runny nose, negative gingival bleeding, Neck:  Negative scars, masses, torticollis, lymphadenopathy, JVD Lungs: Clear to auscultation bilaterally without wheezes or crackles Cardiovascular: Regular rate and rhythm without murmur gallop or rub normal S1 and S2 Abdomen: negative abdominal pain, nondistended, positive soft, bowel sounds, no rebound, no ascites, no appreciable mass Extremities: No significant cyanosis, clubbing, or edema bilateral lower extremities Skin: Sacral decubitus ulcer stage II (reported per RN did not view) Psychiatric: Unable to fully evaluate secondary to patient not able to participate in complex conversation Central nervous system:  Cranial nerves II through XII intact,  tongue/uvula midline, all extremities muscle strength 2/5, sensation intact throughout, negative expressive aphasia, negative receptive aphasia.   .     Data Reviewed: Care during the described time interval was provided by me .  I have reviewed this patient's available data, including medical history, events of note, physical examination, and all test results as part of my evaluation.   CBC: Recent Labs  Lab 01/09/19 0435 01/10/19 0114 01/11/19 0147 01/12/19 0239 01/13/19 0256  WBC 11.8* 7.7 5.8 5.5 7.5  NEUTROABS  --   --   --   --  6.0  HGB 12.2 10.9* 10.7* 10.7* 10.8*  HCT 33.1* 30.7* 30.2* 29.0* 30.9*  MCV 85.5  88.5 88.3 87.9 88.5  PLT 68* 76* 82* 85* 94*   Basic Metabolic Panel: Recent Labs  Lab 01/10/19 0114 01/11/19 0147 01/12/19 0239 01/12/19 1939 01/13/19 0256  NA 137 138 136 136 134*  K 5.1 5.2* 5.0 4.4 4.6  CL 114* 115* 112* 113* 110  CO2 16* 17* 16* 17* 17*  GLUCOSE 108* 100* 110* 110* 117*  BUN 29* 24* 24* 25* 22  CREATININE 0.91 0.86 0.76 0.80 0.79  CALCIUM 8.1* 8.3* 8.3* 8.2* 8.3*  MG  --   --   --  1.7 1.7  PHOS  --   --   --   --  1.5*   GFR: Estimated Creatinine Clearance: 41.8 mL/min (by C-G formula based on SCr of 0.79 mg/dL). Liver Function Tests: Recent Labs  Lab 01/10/19 0114 01/11/19 0147 01/12/19 0239 01/12/19 1939 01/13/19 0256  AST 147* 130* 169* 157* 183*  ALT 44 43 60* 62* 73*  ALKPHOS 615* 639* 728* 641* 751*  BILITOT 1.2 0.7 0.8 0.9 1.0  PROT 5.1* 4.9* 5.5* 5.0* 5.7*  ALBUMIN 1.6* 1.7* 1.7* 1.6* 1.9*   No results for input(s): LIPASE, AMYLASE in the last 168 hours. Recent Labs  Lab 01/12/19 1939  AMMONIA 22   Coagulation Profile: Recent Labs  Lab 01/10/19 0844  INR 1.0   Cardiac Enzymes: Recent Labs  Lab 01/09/19 1016  CKTOTAL 150   BNP (last 3 results) No results for input(s): PROBNP in the last 8760 hours. HbA1C: No results for input(s): HGBA1C in the last 72 hours. CBG: Recent Labs  Lab 01/09/19 0801 01/10/19 0835 01/11/19 0808 01/12/19 0751 01/13/19 0832  GLUCAP 96 96 90 106* 80   Lipid Profile: Recent Labs    01/13/19 0256  CHOL 95  HDL 14*  LDLCALC 59  TRIG 109  CHOLHDL 6.8   Thyroid Function Tests: Recent Labs    01/12/19 1939  TSH 3.700   Anemia Panel: No results for input(s): VITAMINB12, FOLATE, FERRITIN, TIBC, IRON, RETICCTPCT in the last 72 hours. Urine analysis:    Component Value Date/Time   COLORURINE YELLOW 01/08/2019 2015   APPEARANCEUR HAZY (A) 01/08/2019 2015   LABSPEC 1.018 01/08/2019 2015   PHURINE 6.0 01/08/2019 2015   GLUCOSEU NEGATIVE 01/08/2019 2015   HGBUR SMALL (A) 01/08/2019  2015   BILIRUBINUR NEGATIVE 01/08/2019 2015   KETONESUR NEGATIVE 01/08/2019 2015   PROTEINUR NEGATIVE 01/08/2019 2015   UROBILINOGEN 0.2 09-Oct-202014 1048   NITRITE NEGATIVE 01/08/2019 2015   LEUKOCYTESUR NEGATIVE 01/08/2019 2015   Sepsis Labs: @LABRCNTIP (procalcitonin:4,lacticidven:4)  ) Recent Results (from the past 240 hour(s))  C difficile quick scan w PCR reflex     Status: None   Collection Time: 01/03/19  5:36 PM   Specimen: Stool  Result Value Ref Range Status   C Diff antigen NEGATIVE  NEGATIVE Final   C Diff toxin NEGATIVE NEGATIVE Final   C Diff interpretation No C. difficile detected.  Final    Comment: Performed at [facility], [place] [address], [place], [place] [address]  Gastrointestinal Panel by PCR , Stool     Status: None   Collection Time: 01/03/19  5:36 PM   Specimen: Stool  Result Value Ref Range Status   Campylobacter species NOT DETECTED NOT DETECTED Final   Plesimonas shigelloides NOT DETECTED NOT DETECTED Final   Salmonella species NOT DETECTED NOT DETECTED Final   Yersinia enterocolitica NOT DETECTED NOT DETECTED Final   Vibrio species NOT DETECTED NOT DETECTED Final   Vibrio cholerae NOT DETECTED NOT DETECTED Final   Enteroaggregative E coli (EAEC) NOT DETECTED NOT DETECTED Final   Enteropathogenic E coli (EPEC) NOT DETECTED NOT DETECTED Final   Enterotoxigenic E coli (ETEC) NOT DETECTED NOT DETECTED Final   Shiga like toxin producing E coli (STEC) NOT DETECTED NOT DETECTED Final   Shigella/Enteroinvasive E coli (EIEC) NOT DETECTED NOT DETECTED Final   Cryptosporidium NOT DETECTED NOT DETECTED Final   Cyclospora cayetanensis NOT DETECTED NOT DETECTED Final   Entamoeba histolytica NOT DETECTED NOT DETECTED Final   Giardia lamblia NOT DETECTED NOT DETECTED Final   Adenovirus F40/41 NOT DETECTED NOT DETECTED Final   Astrovirus NOT DETECTED NOT DETECTED Final   Norovirus GI/GII NOT DETECTED NOT DETECTED Final   Rotavirus A NOT DETECTED NOT DETECTED  Final   Sapovirus (I, II, IV, and V) NOT DETECTED NOT DETECTED Final    Comment: Performed at [facility], [address] [place], [place] [address]         Radiology Studies: No results found.      Scheduled Meds:  aspirin  81 mg Per Tube Daily   chlorhexidine  15 mL Mouth Rinse BID   enoxaparin (LOVENOX) injection  40 mg Subcutaneous [id number]   folic acid  1 mg Per Tube Daily   Gerhardt's butt cream   Topical TID   latanoprost  1 drop Both Eyes QHS   mouth rinse  15 mL Mouth Rinse q12n4p   megestrol  400 mg Per Tube Daily   mirtazapine  30 mg Per NG tube QHS   multivitamin  15 mL Per Tube Daily   nystatin  5 mL Oral QID   pantoprazole sodium  40 mg Per Tube Daily   sodium chloride flush  3 mL Intravenous Q12H   Continuous Infusions:  sodium chloride     feeding supplement (OSMOLITE 1.2 CAL) 1,000 mL (01/12/19 2247)     LOS: 24 days   The patient is critically ill with multiple organ systems failure and requires high complexity decision making for assessment and support, frequent evaluation and titration of therapies, application of advanced monitoring technologies and extensive interpretation of multiple databases. Critical Care Time devoted to patient care services described in this note  Time spent: 40 minutes     Jermone Geister, Geraldo Docker, MD [facility] Pager [phone]  If 7PM-7AM, please contact night-coverage [email or web address] Password [facility] 01/13/2019, 8:49 AM

## 2019-01-13 NOTE — Progress Notes (Signed)
Palliative Care Follow-up Note  This note is in follow up to an interdisciplinary meeting that was held on 9/16 which including Hospitalist, GI, Palliative, OPE and medical and nursing executive leadership. This meeting was requested by the patient's daughter who was present along with her pastor and a family friend with a healthcare background.   Patient's daughter Sydney [name] is her 48- her stated goals are as follows:  1. Goals are not comfort or QOL oriented at this point in time, she requests that palliative care and hospice be removed from any further discussions, although education was provided on intent and role of palliative care services.  2. Aggressive work up should continue to determine underlying cause of patient's malnutrition, failure to thrive and rapid decline.  3. Treatment options, new interventions, changes to medications etc..all labs and results should be discussed with patient's HCPOA daily or at the time they are being considered- discussed variability of individual physician interpretations and opinions as well as complexity of medical issues and language.   4. No opioids, sedation or medication should be given without prior approval by her HCPOA.  5. HCPOA requested immediate transfer to a tertiary facility for second opinion and does not believe we have provided her with all of the options. - I personally committed to trying to help facilitate this transfer. - I have contacted [facility] -48-72 hours before transfer can occur- awaiting physician call back. - [facility] on 9/17 declines transfer due to bed availability and Covid-19.  My plan is to await call backs from possible transfer institutions and update Sydney [name] when I have more information.  Interval Clinical Information: I have dicussed the above plan with Dr. Sherral Hammers and updated him on IDT meeting and current trajectory and goals. I will continue to follow at a distance and support Sydney [name] and her mother as needed.  Today  there is improvement in her mental status and FEN probably from TF via cortrak which she has now pulled out- she has very severe thrush which I identified on my initial consultation and was concerned this was a major reason she was not eating, but unfortunately we have been unable to treat this because of her liver enzyme elevation and liver injury/cholestasis. Will need to weigh risk benefits-she was vomiting up TF or cream colored material so it may also be contributing to esophagitis- will defer this to medical team-may need ID to weigh in give her hx of fecal transplant for C Diff.  Lane Hacker, DO Palliative Medicine   Time: 35 min Greater than 50%  of this time was spent counseling and coordinating care related to the above assessment and plan.

## 2019-01-14 DIAGNOSIS — R945 Abnormal results of liver function studies: Secondary | ICD-10-CM | POA: Diagnosis not present

## 2019-01-14 DIAGNOSIS — K831 Obstruction of bile duct: Secondary | ICD-10-CM | POA: Diagnosis not present

## 2019-01-14 DIAGNOSIS — B3781 Candidal esophagitis: Secondary | ICD-10-CM

## 2019-01-14 DIAGNOSIS — R531 Weakness: Secondary | ICD-10-CM | POA: Diagnosis not present

## 2019-01-14 DIAGNOSIS — L89152 Pressure ulcer of sacral region, stage 2: Secondary | ICD-10-CM

## 2019-01-14 DIAGNOSIS — R627 Adult failure to thrive: Secondary | ICD-10-CM | POA: Diagnosis not present

## 2019-01-14 DIAGNOSIS — E860 Dehydration: Secondary | ICD-10-CM | POA: Diagnosis not present

## 2019-01-14 DIAGNOSIS — B370 Candidal stomatitis: Secondary | ICD-10-CM

## 2019-01-14 LAB — MAGNESIUM
Magnesium: 2.2 mg/dL (ref 1.7–2.4)
Magnesium: 1.9 mg/dL (ref 1.7–2.4)

## 2019-01-14 LAB — CBC WITH DIFFERENTIAL/PLATELET
Abs Immature Granulocytes: 0.1 10*3/uL — ABNORMAL HIGH (ref 0.00–0.07)
Basophils Absolute: 0 10*3/uL (ref 0.0–0.1)
Basophils Relative: 0 %
Eosinophils Absolute: 0 10*3/uL (ref 0.0–0.5)
Eosinophils Relative: 0 %
HCT: 29.9 % — ABNORMAL LOW (ref 36.0–46.0)
Hemoglobin: 10.1 g/dL — ABNORMAL LOW (ref 12.0–15.0)
Immature Granulocytes: 1 %
Lymphocytes Relative: 5 %
Lymphs Abs: 0.4 10*3/uL — ABNORMAL LOW (ref 0.7–4.0)
MCH: 31.1 pg (ref 26.0–34.0)
MCHC: 33.8 g/dL (ref 30.0–36.0)
MCV: 92 fL (ref 80.0–100.0)
Monocytes Absolute: 0.8 10*3/uL (ref 0.1–1.0)
Monocytes Relative: 11 %
Neutro Abs: 5.8 10*3/uL (ref 1.7–7.7)
Neutrophils Relative %: 83 %
Platelets: 96 10*3/uL — ABNORMAL LOW (ref 150–400)
RBC: 3.25 MIL/uL — ABNORMAL LOW (ref 3.87–5.11)
RDW: 17.2 % — ABNORMAL HIGH (ref 11.5–15.5)
WBC: 7.1 10*3/uL (ref 4.0–10.5)
nRBC: 0.3 % — ABNORMAL HIGH (ref 0.0–0.2)

## 2019-01-14 LAB — COMPREHENSIVE METABOLIC PANEL
ALT: 75 U/L — ABNORMAL HIGH (ref 0–44)
AST: 177 U/L — ABNORMAL HIGH (ref 15–41)
Albumin: 1.8 g/dL — ABNORMAL LOW (ref 3.5–5.0)
Alkaline Phosphatase: 643 U/L — ABNORMAL HIGH (ref 38–126)
Anion gap: 7 (ref 5–15)
BUN: 26 mg/dL — ABNORMAL HIGH (ref 8–23)
CO2: 17 mmol/L — ABNORMAL LOW (ref 22–32)
Calcium: 8.4 mg/dL — ABNORMAL LOW (ref 8.9–10.3)
Chloride: 110 mmol/L (ref 98–111)
Creatinine, Ser: 0.86 mg/dL (ref 0.44–1.00)
GFR calc Af Amer: >60 mL/min (ref >60)
GFR calc non Af Amer: >60 mL/min (ref >60)
Glucose, Bld: 87 mg/dL (ref 70–99)
Potassium: 4.7 mmol/L (ref 3.5–5.1)
Sodium: 134 mmol/L — ABNORMAL LOW (ref 135–145)
Total Bilirubin: 1 mg/dL (ref 0.3–1.2)
Total Protein: 5.6 g/dL — ABNORMAL LOW (ref 6.5–8.1)

## 2019-01-14 LAB — BASIC METABOLIC PANEL
Anion gap: 4 — ABNORMAL LOW (ref 5–15)
BUN: 24 mg/dL — ABNORMAL HIGH (ref 8–23)
CO2: 19 mmol/L — ABNORMAL LOW (ref 22–32)
Calcium: 8.1 mg/dL — ABNORMAL LOW (ref 8.9–10.3)
Chloride: 113 mmol/L — ABNORMAL HIGH (ref 98–111)
Creatinine, Ser: 0.76 mg/dL (ref 0.44–1.00)
GFR calc Af Amer: >60 mL/min (ref >60)
GFR calc non Af Amer: >60 mL/min (ref >60)
Glucose, Bld: 103 mg/dL — ABNORMAL HIGH (ref 70–99)
Potassium: 4.2 mmol/L (ref 3.5–5.1)
Sodium: 136 mmol/L (ref 135–145)

## 2019-01-14 LAB — PHOSPHORUS
Phosphorus: 1.9 mg/dL — ABNORMAL LOW (ref 2.5–4.6)
Phosphorus: 1.6 mg/dL — ABNORMAL LOW (ref 2.5–4.6)

## 2019-01-14 MED ORDER — SODIUM PHOSPHATES 45 MMOLE/15ML IV SOLN: 40.0000 mmol | Freq: Once | INTRAVENOUS | Status: DC

## 2019-01-14 MED ORDER — SODIUM PHOSPHATES 45 MMOLE/15ML IV SOLN
30.0000 mmol | Freq: Once | INTRAVENOUS | Status: AC
  Administered 2019-01-14: 16:00:00 30 mmol via INTRAVENOUS
  Filled 2019-01-14: qty 10

## 2019-01-14 MED ORDER — MAGNESIUM SULFATE 2 GM/50ML IV SOLN
2.0000 g | Freq: Once | INTRAVENOUS | Status: AC
  Administered 2019-01-14: 22:00:00 2 g via INTRAVENOUS
  Filled 2019-01-14: qty 50

## 2019-01-14 MED ORDER — WHITE PETROLATUM EX OINT
TOPICAL_OINTMENT | CUTANEOUS | Status: AC
  Filled 2019-01-14: qty 28.35

## 2019-01-14 NOTE — Progress Notes (Signed)
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 CT of the abdomen and pelvis was negative for acute intra-abdominal or pelvic abnormality. She was found to have mildly elevated LFTs along with hyponatremia and pancytopenia. She was given IV fluids.ID and GI were consulted. Palliative care was subsequently consulted for goals of care discussion at admission.  Multidisciplinary meeting today with medicine, administration, palliative care, GI, nursing staff, patient, patient's pastor, and patient advocate.  Lengthy discussion about patient's ongoing, albeit minimal, improvement with NG tube feeds status post antibiotics and supportive care.  Daughter remains some frustrated given no clear precipitating diagnosis which makes prognosis/timeline somewhat difficult to define.  Patient's daughter is requesting transfer to a different facility for possible further evaluation and work-up.  We discussed that given our extensive work-up here the only remaining evaluations left are MRCP, endoscopy and possibly liver biopsy, all of these would require likely anesthesia/sedation and risks would likely outweigh benefits at this point given patient's advanced age and weakened condition given poor p.o. intake.  Given the patient previously refused disposition to SNF and pending possible transition to outside facility, our other option for disposition would ultimately be home with daughter to continue tube feeds and supportive care as she sees fit.  At this time patient has no obvious acute illness from which she suffers which has caused her mental status change.  Given her improvement at  this time we would continue to focus on supportive care and NG tube feeds, as further work-up can be facilitated at an outpatient procedure or other facility at this time.  Hopefully over the next 24 to 48 hours patient can be reevaluated by PT and speech therapy with further engagement and recommendations about advancement of diet if safe.  Over the past 72 hours patient's mental status has minimally improved, over the previous week patient has only able to moan/calling out in pain.  Patient now able to shake her head yes or no to simple yes/no questions which seems to be appropriate, otherwise verbal status continues to be limited to weak 1 word answers which are quite unintelligible for the most part. Patient does have ongoing NG tube with medications and food per daughter's wishes. Patient has had extensive work-up as below including imaging, labs all of which essentially have been generally unremarkable for overt cause of patient's mental and physical decline. Patient recently treated for questionable UTI given febrile events and mental status changes in hopes that this will improve her status although very minimal if any improvement, patient continues to have markedly poor p.o. intake and likely concurrent depression ongoing. Patient is an advanced age, appears severely malnourished at this point -not consistent with any acute dietary changes but appears to be more chronic, previously refusing all medications and management although daughter continues to request ongoing treatment, multiple consults as well as ongoing evaluation despite extensive negative work-up as below. Per discussion with palliative care as well as GI it appears the patient's daughter is unfortunately unable to cope with the fact that her mother is actively dying. We continue to follow along closely, offer what support we can, ethics has been consulted given inappropriate requests by daughter for consults and treatment which in my  opinion would  be futile (requesting at one point for obgyn to evaluate for possible exam to rule out vaginal cancer which we discussed wouldn't likely be tolerated - patient then asked for blood work to rule out GU cancers -which certainly would not have any meaningful impact on her current disposition).  Additionally patient continues to be somewhat agitated, unclear if patient is in pain or simply agitated due to her worsening mental status, but daughter is requesting we hold off on any further pain medication or anxiolytics.  We continue to recommend to focus on comfort measures and comfort care at this time, but daughter continues to request further workup  I am doubtful that any further imaging, lab work or procedures at this time will change our trajectory.  We unfortunately do not have a working diagnosis for patient's decline other than markedly poor p.o. intake in the setting of likely depression other than possible UTI given recent fever and patient's mental status changes, but have now been treated appropriately with minimal improvement in patient's status.  Patient's daughter also continues to be fixated on single dose of 0.25mg  lorazepam and 0.5mg  of morphine on night shift on 01/04/19 and 01/05/19 respectively that she believes has caused her mother's current nonverbal condition despite our explanation that this medication would have likely cleared within a few hours of administration.  At this time patient's outlook remains grim, patient remains a full code despite discussion with family as patient had previously noted " I want everything done" which at this time precludes any further discussion about code/palliative/hospice.  Patient's mental status remains remarkably poor, patient now completed antibiotic coverage for questionable UTI.  Recent repeat abdominal imaging remarkable for ascites only, not surprising given patient's very low albumin level again in the setting of poor p.o. intake.  Patient  remains high risk for worsening mental status, aspiration, elevated risk of morbidity mortality given advanced age, chronic comorbid conditions and above.   Subjective: 9/19 alert, returned my greeting when I greeted her.  Follow commands.  Requested that she allow nursing staff to give her p.o. medicine patient agreed.  Remains extremely weak    Assessment & Plan:   Principal Problem:   Adult failure to thrive Active Problems:   Pancytopenia (HCC)   Hyponatremia   Protein calorie malnutrition (HCC)   History of TIA (transient ischemic attack)   Generalized weakness   Pressure injury of skin   Transaminitis   Malnutrition of moderate degree   Evaluation by psychiatric service required   MDD (major depressive disorder)  Failure to thrive in adult/severe protein calorie malnutrition/hypoalbuminemia* - CT head negative for acute abnormality see results below -MRI brain negative acute stroke see results below - unclear etiology but possible component of depression.  See depression below - PT/OT recommends SNF.  Daughter declined - Poor oral intake during hospitalization - 9/8 core track placed.  Tube feedings at goal - Per EMR and hospital staff patient's interaction with myself the best they have seen her since admission. -9/18 overnight patient had what sounded like residuals from her core track tube feedings held.  KUB ordered and pending.  Will restart feeds as soon as safe. - Hopefully patient's cognition will continue to improve however if does not at some point will need to consider PEG tube placement (next 7 to 14 days).  If patient to become a SNF candidate will require PEG tube. 9/19 continue tube feeds, RN staff/daughter to encourage patient to take p.o. nystatin in effort to clear up her candidiasis.  Hx TIA - Has not received Plavix since 12/24/2018 patient refusing. -Continue aspirin  Acute metabolic encephalopathy -Multifactorial failure to thrive, infection, shock  liver, metabolic derangement -0000000 appears to be improving slightly patient shakes her head yes and no to questions follows commands though refuses/cannot verbalize - Minimize sedating medication -9/19 improving daily  Shock liver/elevated LFTs - GI following - Unclear etiology - AST/ALT minimally improved but still elevated - Hepatitis panel negative, EBV and CMV IgM negative - CT abdomen and pelvis negative evidence of acute intra-abdominal/pelvic abnormality.  Diffuse fluid within the colon suggestive of enteritis vs diarrheal illness (negative abdominal tenderness, or other GI symptoms) - Alkaline phosphatase, AST, ALT rising.  Most likely not secondary to antibiotics as they were discontinued 9/13.  Will continue to monitor along with GI - RUQ ultrasound showed gallbladder sludge see below - KUB on 12/29/2018 with mild diffuse gaseous prominence without obstructive pattern.  - Suspicion for possible mild shock liver given she was hypotensive on presentation.   - GI also believes that this transaminitis could reflect from skeletal muscle release as well - CK, MB minimally elevated -but improving - GI following, repeat CT on 01/06/2019 remarkable for ascites -9/18 discussed case with Dr. Justice Britain GI who spoken to daughter and plan is for liver Doppler ultrasound.  If abnormalities noted on ultrasound will most likely proceed with MRI/MRCP and possibly a liver biopsy.   Major depressive disorder - Recent loss of sister 2 months ago with extreme ataxia and failure to thrive, daughter continues to think that this therapy is completely wrong. -Psychiatry consulted recommended initiation of antidepressant therapy.  Patient nor daughter interested in initiating treatment.  UTI/yeast infection in groin and oral thrush -Initially started on antibiotics but discontinued after urine culture showed insignificant growth - nystatin topical powder to groin - Nystatin swish and swallow 35ml  5 x  per day or Clotrimazole atrocious 5 times daily - ID following patient - Hopefully in the next couple days patient's cognition will improve enough that speech can officially evaluate patient in order to advance diet. -  9/19 if patient continues to improve will attempt swallow evaluation on Tuesday  Leukopenia - Resolved  Thrombocytopenia -Most likely secondary to patient's liver dysfunction.  Currently no signs or symptoms of occult bleed - Started on vitamin B12 parenterally as she has had low levels of vitamin B12 during last admission -No further work-up indicated, patient can follow-up in outpatient setting with indicated -9/18 slight improvement  AKI -Resolved  Hyponatremia -Resolved  Hypokalemia -Resolved  Refeeding syndrome - Now that patient receiving nutrition continues to have metabolic derangements consistent with refeeding syndrome will need to watch closely all electrolytes.  Hypomagnesmia - Magnesium goal> 2 -Magnesium IV 2 g  Hypophosphatemia - Sodium phosphate 30 mmol - Recheck BMP, Mg,PO4 at 1500 - 9/19   1500 repeat Mg, PO4 low; sodium phosphate 40 mmol  Sacral cubitus ulcer stage II - Continue care per wound care recommendations  Loose stools - Afebrile , negative leukocytosis, negative abdominal pain.  No indication for C. difficile work-up -NOTE; previous history of C. difficile colitis back in 2018 in which she received a fecal transplant - Patient was covered by vancomycin per ID guidelines being treated for UTI as above - No further reported loose stools per nursing staff.   Ethics: - Patient now re-presenting after recent hospitalization, was home for roughly 1-2 days and returned with worsening weakness, severe dehydration, and adult failure to thrive.  Daughter believes that she  just will require some nutrition through artificial means to get her back to her previous baseline. She states that her mother was independent with her ADLs and  cooking for herself prior to admission (unclear timeframe).  Daughter states that her current decline was acute and only started on 12/28/2018; which is contrary to all nursing reports and previous provider reports during her care during this hospitalization (patient weighed 45kg over a month ago -admitted at 48 kg).  Daughter seems to be struggling with the notion that her mother is in the active dying process in regards to her adult failure to thrive in which she is refusing most if not all interventions and has not been eating or drinking now for weeks. - Given this patient's advanced age, repeat hospitalization in less than a few days from previous discharge, patient's prognosis is grim, and this has been relayed to the patient's daughter on multiple occasions by multiple providers. - Ethics has been consulted, following along, we appreciate insight recommendations on this difficult case     DVT prophylaxis: Lovenox/SCD Code Status: Full Family Communication: 9/19 spoke with daughter at length and discussed plan of care answered all questions Disposition Plan: TBD   Consultants:  ID GI Hematology Palliative care   Procedures/Significant Events:     I have personally reviewed and interpreted all radiology studies and my findings are as above.  VENTILATOR SETTINGS:    Cultures   Antimicrobials: Anti-infectives (From admission, onward)   Start     Stop   01/06/19 2200  cephALEXin (KEFLEX) 250 MG/5ML suspension 500 mg     01/08/19 2141   01/06/19 1045  ceFAZolin (ANCEF) IVPB 1 g/50 mL premix  Status:  Discontinued     01/06/19 1734   01/06/19 1000  vancomycin (VANCOCIN) 50 mg/mL oral solution 125 mg     01/08/19 1226   01/05/19 1200  cefTRIAXone (ROCEPHIN) 1 g in sodium chloride 0.9 % 100 mL IVPB  Status:  Discontinued     01/06/19 1037       Devices    LINES / TUBES:  Core track placed 9/8>>>    Continuous Infusions:  sodium chloride     feeding supplement  (OSMOLITE 1.2 CAL) 1,000 mL (01/12/19 2247)     Objective: Vitals:   01/13/19 1625 01/13/19 2135 01/14/19 0525 01/14/19 0900  BP: 122/75 130/78 139/82   Pulse: (!) 104 (!) 109 98 96  Resp: 16 19 18 19   Temp: 99.1 F (37.3 C) 98.6 F (37 C) 99 F (37.2 C)   TempSrc: Axillary Axillary Oral   SpO2: 97% 96% 96%   Weight:      Height:        Intake/Output Summary (Last 24 hours) at 01/14/2019 0912 Last data filed at 01/14/2019 0700 Gross per 24 hour  Intake 1215 ml  Output --  Net 1215 ml   Filed Weights   12/26/18 0500 01/03/19 0500 01/04/19 0500  Weight: 61 kg 61 kg 61 kg   Physical Exam:  General: Much more alert today, return my greeting, follow commands, no acute respiratory distress Eyes: negative scleral hemorrhage, negative anisocoria, negative icterus ENT: Negative Runny nose, negative gingival bleeding, patient still dehydrated with dry mucosa. Neck:  Negative scars, masses, torticollis, lymphadenopathy, JVD Lungs: Clear to auscultation bilaterally without wheezes or crackles Cardiovascular: Regular rate and rhythm without murmur gallop or rub normal S1 and S2 Abdomen: negative abdominal pain, nondistended, positive soft, bowel sounds, no rebound, no ascites, no appreciable mass Extremities: No  significant cyanosis, clubbing, or edema bilateral lower extremities Skin: Stage II sacral decubitus ulcer Psychiatric: Could not evaluate secondary to continued AMS (though significant improvement) Central nervous system:  Cranial nerves II through XII intact, tongue/uvula midline, all extremities muscle strength 2/5, sensation intact throughout, negative dysarthria, negative expressive aphasia, negative receptive aphasia.   .     Data Reviewed: Care during the described time interval was provided by me .  I have reviewed this patient's available data, including medical history, events of note, physical examination, and all test results as part of my evaluation.    CBC: Recent Labs  Lab 01/10/19 0114 01/11/19 0147 01/12/19 0239 01/13/19 0256 01/14/19 0316  WBC 7.7 5.8 5.5 7.5 7.1  NEUTROABS  --   --   --  6.0 5.8  HGB 10.9* 10.7* 10.7* 10.8* 10.1*  HCT 30.7* 30.2* 29.0* 30.9* 29.9*  MCV 88.5 88.3 87.9 88.5 92.0  PLT 76* 82* 85* 94* 96*   Basic Metabolic Panel: Recent Labs  Lab 01/12/19 0239 01/12/19 1939 01/13/19 0256 01/13/19 1908 01/14/19 0316  NA 136 136 134* 134* 134*  K 5.0 4.4 4.6 4.0 4.7  CL 112* 113* 110 110 110  CO2 16* 17* 17* 19* 17*  GLUCOSE 110* 110* 117* 99 87  BUN 24* 25* 22 23 26*  CREATININE 0.76 0.80 0.79 0.80 0.86  CALCIUM 8.3* 8.2* 8.3* 8.5* 8.4*  MG  --  1.7 1.7 2.3 2.2  PHOS  --   --  1.5* 2.0* 1.9*   GFR: Estimated Creatinine Clearance: 38.8 mL/min (by C-G formula based on SCr of 0.86 mg/dL). Liver Function Tests: Recent Labs  Lab 01/11/19 0147 01/12/19 0239 01/12/19 1939 01/13/19 0256 01/14/19 0316  AST 130* 169* 157* 183* 177*  ALT 43 60* 62* 73* 75*  ALKPHOS 639* 728* 641* 751* 643*  BILITOT 0.7 0.8 0.9 1.0 1.0  PROT 4.9* 5.5* 5.0* 5.7* 5.6*  ALBUMIN 1.7* 1.7* 1.6* 1.9* 1.8*   No results for input(s): LIPASE, AMYLASE in the last 168 hours. Recent Labs  Lab 01/12/19 1939  AMMONIA 22   Coagulation Profile: Recent Labs  Lab 01/10/19 0844  INR 1.0   Cardiac Enzymes: Recent Labs  Lab 01/09/19 1016  CKTOTAL 150   BNP (last 3 results) No results for input(s): PROBNP in the last 8760 hours. HbA1C: No results for input(s): HGBA1C in the last 72 hours. CBG: Recent Labs  Lab 01/09/19 0801 01/10/19 0835 01/11/19 0808 01/12/19 0751 01/13/19 0832  GLUCAP 96 96 90 106* 80   Lipid Profile: Recent Labs    01/13/19 0256  CHOL 95  HDL 14*  LDLCALC 59  TRIG 109  CHOLHDL 6.8   Thyroid Function Tests: Recent Labs    01/12/19 1939  TSH 3.700   Anemia Panel: No results for input(s): VITAMINB12, FOLATE, FERRITIN, TIBC, IRON, RETICCTPCT in the last 72 hours. Urine  analysis:    Component Value Date/Time   COLORURINE YELLOW 01/08/2019 2015   APPEARANCEUR HAZY (A) 01/08/2019 2015   LABSPEC 1.018 01/08/2019 2015   PHURINE 6.0 01/08/2019 2015   GLUCOSEU NEGATIVE 01/08/2019 2015   HGBUR SMALL (A) 01/08/2019 2015   BILIRUBINUR NEGATIVE 01/08/2019 2015   KETONESUR NEGATIVE 01/08/2019 2015   PROTEINUR NEGATIVE 01/08/2019 2015   UROBILINOGEN 0.2 2020-12-2312 1048   NITRITE NEGATIVE 01/08/2019 2015   LEUKOCYTESUR NEGATIVE 01/08/2019 2015   Sepsis Labs: @LABRCNTIP (procalcitonin:4,lacticidven:4)  ) No results found for this or any previous visit (from the past 240 hour(s)).  Radiology Studies: Dg Abd 1 View  Result Date: 01/13/2019 CLINICAL DATA:  Blocked feeding tube. EXAM: ABDOMEN - 1 VIEW COMPARISON:  12/29/2018 FINDINGS: Enteric fusion tube is present with tip over the region of the distal stomach just right of midline in the mid abdomen. Bowel gas pattern is nonobstructive. Remainder of the exam is unchanged. IMPRESSION: Nonobstructive bowel gas pattern. Enteric feeding tube with tip right of midline in the mid abdomen likely over the distal stomach. Electronically Signed   By: Marin Olp M.D.   On: 01/13/2019 12:28   Dg Chest Port 1 View  Result Date: 01/13/2019 CLINICAL DATA:  Patient is spitting up 2 feeds. Possible aspiration. EXAM: PORTABLE CHEST 1 VIEW COMPARISON:  Single-view of the chest 12/16/2018. FINDINGS: Feeding tube courses into the stomach and below the inferior margin of the film. The patient has moderate bilateral pleural effusions. Mild basilar atelectasis noted. There is cardiomegaly without edema. No pneumothorax. No acute or focal bony abnormality. IMPRESSION: Moderate layering pleural effusions with mild bibasilar atelectasis. Cardiomegaly without edema. Electronically Signed   By: Inge Rise M.D.   On: 01/13/2019 09:47   US Liver Doppler  Result Date: 01/13/2019 CLINICAL DATA:  85 year old female with elevated  liver enzymes. Evaluate for portal vein stenosis/occlusion. EXAM: DUPLEX ULTRASOUND OF LIVER TECHNIQUE: Color and duplex Doppler ultrasound was performed to evaluate the hepatic in-flow and out-flow vessels. COMPARISON:  Prior CT abdomen/pelvis 01/06/2019 FINDINGS: Liver: Normal parenchymal echogenicity. Normal hepatic contour without nodularity. No focal lesion, mass or intrahepatic biliary ductal dilatation. Portal Vein Velocities Main:  35 cm/sec with normal hepatopetal flow. Right:  23 cm/sec with normal hepatopetal flow. Left:  27 cm/sec with normal hepatopetal flow. Hepatic Vein Velocities Right:  16 cm/sec Middle:  27 cm/sec Left:  23 cm/sec IVC: Present and patent with normal respiratory phasicity. Hepatic Artery Velocity:  102 cm/sec Splenic Vein Velocity:  31 cm/sec Varices: Absent Ascites: Small volume ascites. Other: Bilateral pleural effusions are noted incidentally. IMPRESSION: 1. Patent and normal appearing portal and hepatic veins. 2. Small volume ascites. 3. Bilateral moderate pleural effusions. 4. Normal sonographic appearance of the liver. Electronically Signed   By: Jacqulynn Cadet M.D.   On: 01/13/2019 16:00        Scheduled Meds:  aspirin  81 mg Per Tube Daily   chlorhexidine  15 mL Mouth Rinse BID   collagenase   Topical Daily   enoxaparin (LOVENOX) injection  40 mg Subcutaneous [id number]   folic acid  1 mg Per Tube Daily   Gerhardt's butt cream   Topical TID   latanoprost  1 drop Both Eyes QHS   mouth rinse  15 mL Mouth Rinse q12n4p   megestrol  400 mg Per Tube Daily   mirtazapine  30 mg Per NG tube QHS   multivitamin  15 mL Per Tube Daily   nystatin  5 mL Oral 5 X Daily   pantoprazole sodium  40 mg Per Tube Daily   sodium chloride flush  3 mL Intravenous Q12H   Continuous Infusions:  sodium chloride     feeding supplement (OSMOLITE 1.2 CAL) 1,000 mL (01/12/19 2247)     LOS: 25 days   The patient is critically ill with multiple organ systems failure  and requires high complexity decision making for assessment and support, frequent evaluation and titration of therapies, application of advanced monitoring technologies and extensive interpretation of multiple databases. Critical Care Time devoted to patient care services described in this note  Time spent: 40  minutes     Gemma Ruan, Geraldo Docker, MD [facility] Pager [phone]  If 7PM-7AM, please contact night-coverage [email or web address] Password [email or web address] 01/14/2019, 9:12 AM

## 2019-01-14 NOTE — Progress Notes (Signed)
Patient's daughter called this RN requesting ice chips for her mom. Sherral Hammers, MD paged regarding request. Patient informed per MD patient is not to consume anything by mouth due to risk of aspiration. Daughter verbalized understanding. Attempt made to perform mouth care to patient for comfort and per order, but patient refused at this time. Will continue to monitor.

## 2019-01-14 NOTE — Progress Notes (Addendum)
Progress Note   Subjective  I have the opportunity to see the patient and review her chart this morning. Liver biochemical test profile remains grossly unchanged from yesterday and today. Patient's mental status continues to improve however and she is able to answer more yes and no questions and give some sense of her symptoms at this time. She denies any abdominal pain. She has tolerated the core track being restarted. Her liver ultrasound is noted to be normal with results as below. She does describe some difficulty and pain with swallowing, does not have oral thrush on her exam.    Objective  Vital signs in last 24 hours: Temp:  [98.6 F (37 C)-99.1 F (37.3 C)] 99 F (37.2 C) (09/19 0525) Pulse Rate:  [96-109] 96 (09/19 0900) Resp:  [16-19] 19 (09/19 0900) BP: (122-139)/(75-82) 139/82 (09/19 0525) SpO2:  [96 %-97 %] 96 % (09/19 0525) Last BM Date: 01/14/19  General: She is being cleaned by our nursing team but is in otherwise no acute distress and resting in bed when she is placed back into position HEENT: Core track in place, dry mucous membranes noted Neuro: Oriented to person and place Heart: Non-tachycardic Lungs: Decreased breath sounds at the bases bilaterally DRE: Perianal exam shows a sacral decubitus ulcer that is clean and no more than 1 inch in size without any evidence of fluctuance or purulence Abdomen: Soft, protuberant, no discomfort upon palpation   Extremities: Lower extremity edema present bilaterally    Intake/Output from previous day: 09/18 0701 - 09/19 0700 In: 1215 [I.V.:110; NG/GT:1105] Out: -  Intake/Output this shift: Total I/O In: 95 [NG/GT:95] Out: -   Lab Results: Recent Labs    01/12/19 0239 01/13/19 0256 01/14/19 0316  WBC 5.5 7.5 7.1  HGB 10.7* 10.8* 10.1*  HCT 29.0* 30.9* 29.9*  PLT 85* 94* 96*   BMET Recent Labs    01/13/19 0256 01/13/19 1908 01/14/19 0316  NA 134* 134* 134*  K 4.6 4.0 4.7  CL 110 110 110  CO2  17* 19* 17*  GLUCOSE 117* 99 87  BUN 22 23 26*  CREATININE 0.79 0.80 0.86  CALCIUM 8.3* 8.5* 8.4*   LFT Recent Labs    01/14/19 0316  PROT 5.6*  ALBUMIN 1.8*  AST 177*  ALT 75*  ALKPHOS 643*  BILITOT 1.0   PT/INR No results for input(s): LABPROT, INR in the last 72 hours.  Studies/Results: Liver ultrasound Doppler IMPRESSION: 1. Patent and normal appearing portal and hepatic veins. 2. Small volume ascites. 3. Bilateral moderate pleural effusions. 4. Normal sonographic appearance of the liver.    Assessment  This is a 85 y.o. female with pmh significant for arthritis, glaucoma, hyperlipidemia, osteoporosis, PVD, prior C. difficile infection status post FMT who was admitted with failure to thrive and liver biochemical tests abnormalities.  Today, the patient seems to be doing well and slightly improved from yesterday as well.  I am happy to continue to see this.  She has had her core track feeds restarted which I think is most important for her.  Her mouth shows no evidence of oral thrush but she continues to describe issues of pain with swallowing.  I think she is more awake at this time.  If the medical service and speech-language pathology feel that the patient is able to either swallow liquid in a more formidable fashion versus have clotrimazole troches then I think we should initiate her on therapy for potential candidiasis of the oropharynx/esophagus.  I may even consider liquid fluconazole into next week depending on how the liver biochemical tests are.  Obviously she has a cholestasis but we can monitor things and use the fluconazole as a potential treatment in the near future if things do not work with the nystatin or clotrimazole troches.  The patient has no evidence of biliary ductal dilation having occurred in the setting of her repeat liver ultrasound and the parenchyma looks to be normal in status.  I still suspect cholestasis from 1 of her recent medications but  thankfully the alk phos has come down slightly today.  We do not need to check liver tests every day. ° ° °Plan  °If no contraindication by medicine and SOP okay: °-Start nystatin 400,000 units swish and swallow 5 times daily or clotrimazole troches 5 times daily °-If patient continues to have issues then consider oral fluconazole liquid formulation 400 mg once and 200 mg daily thereafter °Liver tests do not need to be checked on a daily basis at this time °INR to be checked tomorrow (if vitamin K is necessary will be administered via IV) °Liver biopsy is the only liver related testing that has not been completed as of yet and as the patient's daughter wants to minimize sedation this is on hold °Continue nutrition via core track or if patient's daughter does not want this to be continued then consideration of discussion with interventional radiology for PEG placement can be had °Continue dressings of the sacral decubitus as per nursing ° ° °GI will reevaluate the patient into next week if she remains hospitalized at [facility].  Please call if any new questions.  ° ° ° Mansouraty, MD °[facility] °[facility] °Office # [phone] °

## 2019-01-14 NOTE — Plan of Care (Signed)
  Problem: Elimination: Goal: Will not experience complications related to bowel motility Outcome: Progressing Goal: Will not experience complications related to urinary retention Outcome: Progressing   Problem: Pain Managment: Goal: General experience of comfort will improve Outcome: Progressing   

## 2019-01-15 DIAGNOSIS — R531 Weakness: Secondary | ICD-10-CM | POA: Diagnosis not present

## 2019-01-15 DIAGNOSIS — R945 Abnormal results of liver function studies: Secondary | ICD-10-CM | POA: Diagnosis not present

## 2019-01-15 DIAGNOSIS — E860 Dehydration: Secondary | ICD-10-CM | POA: Diagnosis not present

## 2019-01-15 DIAGNOSIS — R627 Adult failure to thrive: Secondary | ICD-10-CM | POA: Diagnosis not present

## 2019-01-15 LAB — MISC LABCORP TEST (SEND OUT): Labcorp test code: 1612

## 2019-01-15 LAB — MAGNESIUM: Magnesium: 2.3 mg/dL (ref 1.7–2.4)

## 2019-01-15 LAB — CBC WITH DIFFERENTIAL/PLATELET
Abs Immature Granulocytes: 0.09 10*3/uL — ABNORMAL HIGH (ref 0.00–0.07)
Basophils Absolute: 0 10*3/uL (ref 0.0–0.1)
Basophils Relative: 0 %
Eosinophils Absolute: 0 10*3/uL (ref 0.0–0.5)
Eosinophils Relative: 0 %
HCT: 26.6 % — ABNORMAL LOW (ref 36.0–46.0)
Hemoglobin: 9.6 g/dL — ABNORMAL LOW (ref 12.0–15.0)
Immature Granulocytes: 1 %
Lymphocytes Relative: 6 %
Lymphs Abs: 0.4 10*3/uL — ABNORMAL LOW (ref 0.7–4.0)
MCH: 32.4 pg (ref 26.0–34.0)
MCHC: 36.1 g/dL — ABNORMAL HIGH (ref 30.0–36.0)
MCV: 89.9 fL (ref 80.0–100.0)
Monocytes Absolute: 0.5 10*3/uL (ref 0.1–1.0)
Monocytes Relative: 7 %
Neutro Abs: 5.5 10*3/uL (ref 1.7–7.7)
Neutrophils Relative %: 86 %
Platelets: 101 10*3/uL — ABNORMAL LOW (ref 150–400)
RBC: 2.96 MIL/uL — ABNORMAL LOW (ref 3.87–5.11)
RDW: 16.5 % — ABNORMAL HIGH (ref 11.5–15.5)
WBC: 6.4 10*3/uL (ref 4.0–10.5)
nRBC: 0.6 % — ABNORMAL HIGH (ref 0.0–0.2)

## 2019-01-15 LAB — PROTIME-INR
INR: 1 (ref 0.8–1.2)
Prothrombin Time: 13 seconds (ref 11.4–15.2)

## 2019-01-15 LAB — PHOSPHORUS: Phosphorus: 3.8 mg/dL (ref 2.5–4.6)

## 2019-01-15 NOTE — Progress Notes (Signed)
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 CT of the abdomen and pelvis was negative for acute intra-abdominal or pelvic abnormality. She was found to have mildly elevated LFTs along with hyponatremia and pancytopenia. She was given IV fluids.ID and GI were consulted. Palliative care was subsequently consulted for goals of care discussion at admission.  Multidisciplinary meeting today with medicine, administration, palliative care, GI, nursing staff, patient, patient's pastor, and patient advocate.  Lengthy discussion about patient's ongoing, albeit minimal, improvement with NG tube feeds status post antibiotics and supportive care.  Daughter remains some frustrated given no clear precipitating diagnosis which makes prognosis/timeline somewhat difficult to define.  Patient's daughter is requesting transfer to a different facility for possible further evaluation and work-up.  We discussed that given our extensive work-up here the only remaining evaluations left are MRCP, endoscopy and possibly liver biopsy, all of these would require likely anesthesia/sedation and risks would likely outweigh benefits at this point given patient's advanced age and weakened condition given poor p.o. intake.  Given the patient previously refused disposition to SNF and pending possible transition to outside facility, our other option for disposition would ultimately be home with daughter to continue tube feeds and supportive care as she sees fit.  At this time patient has no obvious acute illness from which she suffers which has caused her mental status change.  Given her improvement at  this time we would continue to focus on supportive care and NG tube feeds, as further work-up can be facilitated at an outpatient procedure or other facility at this time.  Hopefully over the next 24 to 48 hours patient can be reevaluated by PT and speech therapy with further engagement and recommendations about advancement of diet if safe.  Over the past 72 hours patient's mental status has minimally improved, over the previous week patient has only able to moan/calling out in pain.  Patient now able to shake her head yes or no to simple yes/no questions which seems to be appropriate, otherwise verbal status continues to be limited to weak 1 word answers which are quite unintelligible for the most part. Patient does have ongoing NG tube with medications and food per daughter's wishes. Patient has had extensive work-up as below including imaging, labs all of which essentially have been generally unremarkable for overt cause of patient's mental and physical decline. Patient recently treated for questionable UTI given febrile events and mental status changes in hopes that this will improve her status although very minimal if any improvement, patient continues to have markedly poor p.o. intake and likely concurrent depression ongoing. Patient is an advanced age, appears severely malnourished at this point -not consistent with any acute dietary changes but appears to be more chronic, previously refusing all medications and management although daughter continues to request ongoing treatment, multiple consults as well as ongoing evaluation despite extensive negative work-up as below. Per discussion with palliative care as well as GI it appears the patient's daughter is unfortunately unable to cope with the fact that her mother is actively dying. We continue to follow along closely, offer what support we can, ethics has been consulted given inappropriate requests by daughter for consults and treatment which in my  opinion would  be futile (requesting at one point for obgyn to evaluate for possible exam to rule out vaginal cancer which we discussed wouldn't likely be tolerated - patient then asked for blood work to rule out GU cancers -which certainly would not have any meaningful impact on her current disposition).  Additionally patient continues to be somewhat agitated, unclear if patient is in pain or simply agitated due to her worsening mental status, but daughter is requesting we hold off on any further pain medication or anxiolytics.  We continue to recommend to focus on comfort measures and comfort care at this time, but daughter continues to request further workup  I am doubtful that any further imaging, lab work or procedures at this time will change our trajectory.  We unfortunately do not have a working diagnosis for patient's decline other than markedly poor p.o. intake in the setting of likely depression other than possible UTI given recent fever and patient's mental status changes, but have now been treated appropriately with minimal improvement in patient's status.  Patient's daughter also continues to be fixated on single dose of 0.25mg  lorazepam and 0.5mg  of morphine on night shift on 01/04/19 and 01/05/19 respectively that she believes has caused her mother's current nonverbal condition despite our explanation that this medication would have likely cleared within a few hours of administration.  At this time patient's outlook remains grim, patient remains a full code despite discussion with family as patient had previously noted " I want everything done" which at this time precludes any further discussion about code/palliative/hospice.  Patient's mental status remains remarkably poor, patient now completed antibiotic coverage for questionable UTI.  Recent repeat abdominal imaging remarkable for ascites only, not surprising given patient's very low albumin level again in the setting of poor p.o. intake.  Patient  remains high risk for worsening mental status, aspiration, elevated risk of morbidity mortality given advanced age, chronic comorbid conditions and above.   Subjective: 9/20 alert, follows all commands.  Performing PT with her daughter when I entered room.  Answered yes and no questions, but per [name] (daughter) asked earlier to speak with her pastor on the phone and prayed with him.  Assessment & Plan:   Principal Problem:   Adult failure to thrive Active Problems:   Pancytopenia (HCC)   Hyponatremia   Protein calorie malnutrition (HCC)   History of TIA (transient ischemic attack)   Generalized weakness   Pressure injury of skin   Transaminitis   Malnutrition of moderate degree   Evaluation by psychiatric service required   MDD (major depressive disorder)  Failure to thrive in adult/severe protein calorie malnutrition/hypoalbuminemia* - CT head negative for acute abnormality see results below -MRI brain negative acute stroke see results below - unclear etiology but possible component of depression.  See depression below - PT/OT recommends SNF.  Daughter declined - Poor oral intake during hospitalization - 9/8 core track placed.  Tube feedings at goal - Per EMR and hospital staff patient's interaction with myself the best they have seen her since admission. -9/18 overnight patient had what sounded like residuals from her core track tube feedings held.  KUB ordered and pending.  Will restart feeds as soon as safe. - Hopefully patient's cognition will continue to improve however if does not at some point will need to consider PEG tube placement (next 7 to 14 days).  If patient to become a SNF candidate will require PEG tube. 9/19 continue tube feeds, RN staff/daughter to encourage patient to take p.o. nystatin  in effort to clear up her candidiasis.  Hx TIA - Has not received Plavix since 12/24/2018 patient refusing. -Continue aspirin  Acute metabolic  encephalopathy -Multifactorial failure to thrive, infection, shock liver, metabolic derangement -0000000 appears to be improving slightly patient shakes her head yes and no to questions follows commands though refuses/cannot verbalize - Minimize sedating medication -9/20 patient answer questions, waved hello and goodbye work with her daughter on physical therapy.  Shock liver/elevated LFTs - GI following - Unclear etiology - AST/ALT minimally improved but still elevated - Hepatitis panel negative, EBV and CMV IgM negative - CT abdomen and pelvis negative evidence of acute intra-abdominal/pelvic abnormality.  Diffuse fluid within the colon suggestive of enteritis vs diarrheal illness (negative abdominal tenderness, or other GI symptoms) - Alkaline phosphatase, AST, ALT rising.  Most likely not secondary to antibiotics as they were discontinued 9/13.  Will continue to monitor along with GI - RUQ ultrasound showed gallbladder sludge see below - KUB on 12/29/2018 with mild diffuse gaseous prominence without obstructive pattern.  - Suspicion for possible mild shock liver given she was hypotensive on presentation.   - GI also believes that this transaminitis could reflect from skeletal muscle release as well - CK, MB minimally elevated -but improving - GI following, repeat CT on 01/06/2019 remarkable for ascites -9/18 discussed case with Dr. Justice Britain GI who spoken to daughter and plan is for liver Doppler ultrasound.  If abnormalities noted on ultrasound will most likely proceed with MRI/MRCP and possibly a liver biopsy.   Major depressive disorder - Recent loss of sister 2 months ago with extreme ataxia and failure to thrive, daughter continues to think that this therapy is completely wrong. -Psychiatry consulted recommended initiation of antidepressant therapy.  Patient nor daughter interested in initiating treatment.  UTI/yeast infection in groin and oral thrush -Initially started on  antibiotics but discontinued after urine culture showed insignificant growth - nystatin topical powder to groin - Nystatin swish and swallow 4ml  5 x per day or Clotrimazole atrocious 5 times daily - ID following patient - Hopefully in the next couple days patient's cognition will improve enough that speech can officially evaluate patient in order to advance diet. -  9/19 if patient continues to improve will attempt swallow evaluation on Tuesday  Leukopenia - Resolved  Thrombocytopenia -Most likely secondary to patient's liver dysfunction.  Currently no signs or symptoms of occult bleed - Started on vitamin B12 parenterally as she has had low levels of vitamin B12 during last admission -No further work-up indicated, patient can follow-up in outpatient setting with indicated -9/18 slight improvement  AKI -Resolved  Hyponatremia -Resolved  Hypokalemia -Resolved  Refeeding syndrome - Now that patient receiving nutrition continues to have metabolic derangements consistent with refeeding syndrome will need to watch closely all electrolytes.  Hypomagnesmia - Magnesium goal> 2  Hypophosphatemia -Resolved  Sacral cubitus ulcer stage II - Continue care per wound care recommendations  Loose stools - Afebrile , negative leukocytosis, negative abdominal pain.  No indication for C. difficile work-up -NOTE; previous history of C. difficile colitis back in 2018 in which she received a fecal transplant - Patient was covered by vancomycin per ID guidelines being treated for UTI as above - No further reported loose stools per nursing staff.   Ethics: - Patient now re-presenting after recent hospitalization, was home for roughly 1-2 days and returned with worsening weakness, severe dehydration, and adult failure to thrive.  Daughter believes that she just will require some nutrition through artificial  means to get her back to her previous baseline. She states that her mother was  independent with her ADLs and cooking for herself prior to admission (unclear timeframe).  Daughter states that her current decline was acute and only started on 12/28/2018; which is contrary to all nursing reports and previous provider reports during her care during this hospitalization (patient weighed 45kg over a month ago -admitted at 48 kg).  Daughter seems to be struggling with the notion that her mother is in the active dying process in regards to her adult failure to thrive in which she is refusing most if not all interventions and has not been eating or drinking now for weeks. - Given this patient's advanced age, repeat hospitalization in less than a few days from previous discharge, patient's prognosis is grim, and this has been relayed to the patient's daughter on multiple occasions by multiple providers. - Ethics has been consulted, following along, we appreciate insight recommendations on this difficult case     DVT prophylaxis: Lovenox/SCD Code Status: Full Family Communication: 9/20 spoke with daughter at length and discussed plan of care answered all questions Disposition Plan: TBD   Consultants:  ID GI Hematology Palliative care   Procedures/Significant Events:     I have personally reviewed and interpreted all radiology studies and my findings are as above.  VENTILATOR SETTINGS:    Cultures   Antimicrobials: Anti-infectives (From admission, onward)   Start     Stop   01/06/19 2200  cephALEXin (KEFLEX) 250 MG/5ML suspension 500 mg     01/08/19 2141   01/06/19 1045  ceFAZolin (ANCEF) IVPB 1 g/50 mL premix  Status:  Discontinued     01/06/19 1734   01/06/19 1000  vancomycin (VANCOCIN) 50 mg/mL oral solution 125 mg     01/08/19 1226   01/05/19 1200  cefTRIAXone (ROCEPHIN) 1 g in sodium chloride 0.9 % 100 mL IVPB  Status:  Discontinued     01/06/19 1037       Devices    LINES / TUBES:  Core track placed 9/8>>>    Continuous Infusions:  sodium  chloride     feeding supplement (OSMOLITE 1.2 CAL) 1,000 mL (01/14/19 1253)     Objective: Vitals:   01/14/19 2335 01/15/19 0500 01/15/19 0518 01/15/19 0522  BP:   128/68 128/68  Pulse: (!) 103  94 94  Resp:    20  Temp:    98.6 F (37 C)  TempSrc:    Oral  SpO2: 98%   97%  Weight:  60 kg    Height:        Intake/Output Summary (Last 24 hours) at 01/15/2019 1037 Last data filed at 01/15/2019 [id number] Gross per 24 hour  Intake 1106.25 ml  Output --  Net 1106.25 ml   Filed Weights   01/03/19 0500 01/04/19 0500 01/15/19 0500  Weight: 61 kg 61 kg 60 kg   Physical Exam:  General: Alert, follows commands, performing PT with daughter no acute respiratory distress, cachectic Eyes: negative scleral hemorrhage, negative anisocoria, negative icterus ENT: Negative Runny nose, negative gingival bleeding, Neck:  Negative scars, masses, torticollis, lymphadenopathy, JVD Lungs: Clear to auscultation bilaterally without wheezes or crackles Cardiovascular: Regular rate and rhythm without murmur gallop or rub normal S1 and S2 Abdomen: negative abdominal pain, nondistended, positive soft, bowel sounds, no rebound, no ascites, no appreciable mass Extremities: No significant cyanosis, clubbing, or edema bilateral lower extremities Skin: Stage II sacral decubitus ulcer Psychiatric: Could not fully evaluate Central  nervous system:  Cranial nerves II through XII intact, tongue/uvula midline, all extremities muscle strength 3-4/5, sensation intact throughout, negative dysarthria, negative expressive aphasia, negative receptive aphasia.  .     Data Reviewed: Care during the described time interval was provided by me .  I have reviewed this patient's available data, including medical history, events of note, physical examination, and all test results as part of my evaluation.   CBC: Recent Labs  Lab 01/11/19 0147 01/12/19 0239 01/13/19 0256 01/14/19 0316 01/15/19 0444  WBC 5.8 5.5 7.5 7.1  6.4  NEUTROABS  --   --  6.0 5.8 5.5  HGB 10.7* 10.7* 10.8* 10.1* 9.6*  HCT 30.2* 29.0* 30.9* 29.9* 26.6*  MCV 88.3 87.9 88.5 92.0 89.9  PLT 82* 85* 94* 96* [id number]*   Basic Metabolic Panel: Recent Labs  Lab 01/12/19 1939 01/13/19 0256 01/13/19 1908 01/14/19 0316 01/14/19 1506 01/15/19 0444  NA 136 134* 134* 134* 136  --   K 4.4 4.6 4.0 4.7 4.2  --   CL 113* 110 110 110 113*  --   CO2 17* 17* 19* 17* 19*  --   GLUCOSE 110* 117* 99 87 103*  --   BUN 25* 22 23 26* 24*  --   CREATININE 0.80 0.79 0.80 0.86 0.76  --   CALCIUM 8.2* 8.3* 8.5* 8.4* 8.1*  --   MG 1.7 1.7 2.3 2.2 1.9 2.3  PHOS  --  1.5* 2.0* 1.9* 1.6* 3.8   GFR: Estimated Creatinine Clearance: 41.8 mL/min (by C-G formula based on SCr of 0.76 mg/dL). Liver Function Tests: Recent Labs  Lab 01/11/19 0147 01/12/19 0239 01/12/19 1939 01/13/19 0256 01/14/19 0316  AST 130* 169* 157* 183* 177*  ALT 43 60* 62* 73* 75*  ALKPHOS 639* 728* 641* 751* 643*  BILITOT 0.7 0.8 0.9 1.0 1.0  PROT 4.9* 5.5* 5.0* 5.7* 5.6*  ALBUMIN 1.7* 1.7* 1.6* 1.9* 1.8*   No results for input(s): LIPASE, AMYLASE in the last 168 hours. Recent Labs  Lab 01/12/19 1939  AMMONIA 22   Coagulation Profile: Recent Labs  Lab 01/10/19 0844 01/15/19 0444  INR 1.0 1.0   Cardiac Enzymes: Recent Labs  Lab 01/09/19 1016  CKTOTAL 150   BNP (last 3 results) No results for input(s): PROBNP in the last 8760 hours. HbA1C: No results for input(s): HGBA1C in the last 72 hours. CBG: Recent Labs  Lab 01/09/19 0801 01/10/19 0835 01/11/19 0808 01/12/19 0751 01/13/19 0832  GLUCAP 96 96 90 106* 80   Lipid Profile: Recent Labs    01/13/19 0256  CHOL 95  HDL 14*  LDLCALC 59  TRIG 109  CHOLHDL 6.8   Thyroid Function Tests: Recent Labs    01/12/19 1939  TSH 3.700   Anemia Panel: No results for input(s): VITAMINB12, FOLATE, FERRITIN, TIBC, IRON, RETICCTPCT in the last 72 hours. Urine analysis:    Component Value Date/Time   COLORURINE  YELLOW 01/08/2019 2015   APPEARANCEUR HAZY (A) 01/08/2019 2015   LABSPEC 1.018 01/08/2019 2015   PHURINE 6.0 01/08/2019 2015   GLUCOSEU NEGATIVE 01/08/2019 2015   HGBUR SMALL (A) 01/08/2019 2015   BILIRUBINUR NEGATIVE 01/08/2019 2015   KETONESUR NEGATIVE 01/08/2019 2015   PROTEINUR NEGATIVE 01/08/2019 2015   UROBILINOGEN 0.2 2020/08/2412 1048   NITRITE NEGATIVE 01/08/2019 2015   LEUKOCYTESUR NEGATIVE 01/08/2019 2015   Sepsis Labs: @LABRCNTIP (procalcitonin:4,lacticidven:4)  ) No results found for this or any previous visit (from the past 240 hour(s)).  Radiology Studies: Dg Abd 1 View  Result Date: 01/13/2019 CLINICAL DATA:  Blocked feeding tube. EXAM: ABDOMEN - 1 VIEW COMPARISON:  12/29/2018 FINDINGS: Enteric fusion tube is present with tip over the region of the distal stomach just right of midline in the mid abdomen. Bowel gas pattern is nonobstructive. Remainder of the exam is unchanged. IMPRESSION: Nonobstructive bowel gas pattern. Enteric feeding tube with tip right of midline in the mid abdomen likely over the distal stomach. Electronically Signed   By: Marin Olp M.D.   On: 01/13/2019 12:28   US Liver Doppler  Result Date: 01/13/2019 CLINICAL DATA:  85 year old female with elevated liver enzymes. Evaluate for portal vein stenosis/occlusion. EXAM: DUPLEX ULTRASOUND OF LIVER TECHNIQUE: Color and duplex Doppler ultrasound was performed to evaluate the hepatic in-flow and out-flow vessels. COMPARISON:  Prior CT abdomen/pelvis 01/06/2019 FINDINGS: Liver: Normal parenchymal echogenicity. Normal hepatic contour without nodularity. No focal lesion, mass or intrahepatic biliary ductal dilatation. Portal Vein Velocities Main:  35 cm/sec with normal hepatopetal flow. Right:  23 cm/sec with normal hepatopetal flow. Left:  27 cm/sec with normal hepatopetal flow. Hepatic Vein Velocities Right:  16 cm/sec Middle:  27 cm/sec Left:  23 cm/sec IVC: Present and patent with normal respiratory  phasicity. Hepatic Artery Velocity:  102 cm/sec Splenic Vein Velocity:  31 cm/sec Varices: Absent Ascites: Small volume ascites. Other: Bilateral pleural effusions are noted incidentally. IMPRESSION: 1. Patent and normal appearing portal and hepatic veins. 2. Small volume ascites. 3. Bilateral moderate pleural effusions. 4. Normal sonographic appearance of the liver. Electronically Signed   By: Jacqulynn Cadet M.D.   On: 01/13/2019 16:00        Scheduled Meds:  aspirin  81 mg Per Tube Daily   chlorhexidine  15 mL Mouth Rinse BID   collagenase   Topical Daily   enoxaparin (LOVENOX) injection  40 mg Subcutaneous [id number]   folic acid  1 mg Per Tube Daily   Gerhardt's butt cream   Topical TID   latanoprost  1 drop Both Eyes QHS   mouth rinse  15 mL Mouth Rinse q12n4p   megestrol  400 mg Per Tube Daily   mirtazapine  30 mg Per NG tube QHS   multivitamin  15 mL Per Tube Daily   nystatin  5 mL Oral 5 X Daily   pantoprazole sodium  40 mg Per Tube Daily   sodium chloride flush  3 mL Intravenous Q12H   Continuous Infusions:  sodium chloride     feeding supplement (OSMOLITE 1.2 CAL) 1,000 mL (01/14/19 1253)     LOS: 26 days   The patient is critically ill with multiple organ systems failure and requires high complexity decision making for assessment and support, frequent evaluation and titration of therapies, application of advanced monitoring technologies and extensive interpretation of multiple databases. Critical Care Time devoted to patient care services described in this note  Time spent: 40 minutes     Tru Rana, Geraldo Docker, MD [facility] Pager [phone]  If 7PM-7AM, please contact night-coverage [email or web address] Password [facility] 01/15/2019, 10:37 AM

## 2019-01-15 NOTE — Progress Notes (Signed)
Patient grasps at perineum when voiding. When asked about pain. Sometimes nods yes to pain. Also c/o burning with urination. Will continue to monitor.

## 2019-01-16 DIAGNOSIS — E538 Deficiency of other specified B group vitamins: Secondary | ICD-10-CM | POA: Diagnosis not present

## 2019-01-16 DIAGNOSIS — E43 Unspecified severe protein-calorie malnutrition: Secondary | ICD-10-CM

## 2019-01-16 DIAGNOSIS — E878 Other disorders of electrolyte and fluid balance, not elsewhere classified: Secondary | ICD-10-CM

## 2019-01-16 DIAGNOSIS — R945 Abnormal results of liver function studies: Secondary | ICD-10-CM | POA: Diagnosis not present

## 2019-01-16 DIAGNOSIS — E860 Dehydration: Secondary | ICD-10-CM | POA: Diagnosis not present

## 2019-01-16 DIAGNOSIS — R627 Adult failure to thrive: Secondary | ICD-10-CM | POA: Diagnosis not present

## 2019-01-16 LAB — MAGNESIUM: Magnesium: 2 mg/dL (ref 1.7–2.4)

## 2019-01-16 LAB — CBC WITH DIFFERENTIAL/PLATELET
Abs Immature Granulocytes: 0.04 10*3/uL (ref 0.00–0.07)
Basophils Absolute: 0 10*3/uL (ref 0.0–0.1)
Basophils Relative: 0 %
Eosinophils Absolute: 0 10*3/uL (ref 0.0–0.5)
Eosinophils Relative: 0 %
HCT: 27 % — ABNORMAL LOW (ref 36.0–46.0)
Hemoglobin: 9.5 g/dL — ABNORMAL LOW (ref 12.0–15.0)
Immature Granulocytes: 1 %
Lymphocytes Relative: 6 %
Lymphs Abs: 0.4 10*3/uL — ABNORMAL LOW (ref 0.7–4.0)
MCH: 32 pg (ref 26.0–34.0)
MCHC: 35.2 g/dL (ref 30.0–36.0)
MCV: 90.9 fL (ref 80.0–100.0)
Monocytes Absolute: 0.5 10*3/uL (ref 0.1–1.0)
Monocytes Relative: 7 %
Neutro Abs: 5.9 10*3/uL (ref 1.7–7.7)
Neutrophils Relative %: 86 %
Platelets: 109 10*3/uL — ABNORMAL LOW (ref 150–400)
RBC: 2.97 MIL/uL — ABNORMAL LOW (ref 3.87–5.11)
RDW: 16.9 % — ABNORMAL HIGH (ref 11.5–15.5)
WBC: 6.8 10*3/uL (ref 4.0–10.5)
nRBC: 0 % (ref 0.0–0.2)

## 2019-01-16 LAB — COMPREHENSIVE METABOLIC PANEL
ALT: 113 U/L — ABNORMAL HIGH (ref 0–44)
AST: 206 U/L — ABNORMAL HIGH (ref 15–41)
Albumin: 1.7 g/dL — ABNORMAL LOW (ref 3.5–5.0)
Alkaline Phosphatase: 506 U/L — ABNORMAL HIGH (ref 38–126)
Anion gap: 6 (ref 5–15)
BUN: 23 mg/dL (ref 8–23)
CO2: 18 mmol/L — ABNORMAL LOW (ref 22–32)
Calcium: 7.6 mg/dL — ABNORMAL LOW (ref 8.9–10.3)
Chloride: 111 mmol/L (ref 98–111)
Creatinine, Ser: 0.73 mg/dL (ref 0.44–1.00)
GFR calc Af Amer: >60 mL/min (ref >60)
GFR calc non Af Amer: >60 mL/min (ref >60)
Glucose, Bld: 106 mg/dL — ABNORMAL HIGH (ref 70–99)
Potassium: 4 mmol/L (ref 3.5–5.1)
Sodium: 135 mmol/L (ref 135–145)
Total Bilirubin: 0.6 mg/dL (ref 0.3–1.2)
Total Protein: 5 g/dL — ABNORMAL LOW (ref 6.5–8.1)

## 2019-01-16 LAB — PHOSPHORUS: Phosphorus: 3.6 mg/dL (ref 2.5–4.6)

## 2019-01-16 NOTE — Progress Notes (Signed)
[facility] Progress Note  Chief Complaint: Elevated liver function tests  History:  I saw Sydney [name] early this morning, and then spoke with her daughter by phone just now. It sounds like she is largely unchanged from yesterday, though certainly more interactive than she had been last week.  This morning she was somnolent, mumbled a few words including doctor.  She did not appear to be in any pain.  Nasoduodenal tube is still in place and running without difficulty.   ROS: Unable to obtain additional review of systems due to patient's mental status.  Objective:   Current Facility-Administered Medications:  .  0.9 %  sodium chloride infusion, 250 mL, Intravenous, PRN, Opyd, Ilene Qua, MD .  acetaminophen (TYLENOL) tablet 650 mg, 650 mg, Per NG tube, Q6H PRN, 650 mg at 01/15/19 2121 **OR** acetaminophen (TYLENOL) suppository 650 mg, 650 mg, Rectal, Q6H PRN, Little Ishikawa, MD .  aspirin chewable tablet 81 mg, 81 mg, Per Tube, Daily, Little Ishikawa, MD, 81 mg at 01/16/19 1159 .  chlorhexidine (PERIDEX) 0.12 % solution 15 mL, 15 mL, Mouth Rinse, BID, Little Ishikawa, MD, 15 mL at 01/16/19 1158 .  collagenase (SANTYL) ointment, , Topical, Daily, Opyd, Timothy S, MD .  enoxaparin (LOVENOX) injection 40 mg, 40 mg, Subcutaneous, Q24H, Allie Bossier, MD, 40 mg at 01/15/19 2020 .  feeding supplement (OSMOLITE 1.2 CAL) liquid 1,000 mL, 1,000 mL, Per Tube, Continuous, [place] Donnamarie Poag, DO, Last Rate: 45 mL/hr at 01/14/19 1253, 1,000 mL at 01/14/19 1253 .  folic acid (FOLVITE) tablet 1 mg, 1 mg, Per Tube, Daily, Little Ishikawa, MD, 1 mg at 01/16/19 1200 .  Gerhardt's butt cream, , Topical, TID, Pahwani, Ravi, MD .  latanoprost (XALATAN) 0.005 % ophthalmic solution 1 drop, 1 drop, Both Eyes, QHS, [place] Donnamarie Poag, DO, 1 drop at 01/15/19 2121 .  magic mouthwash, 5 mL, Oral, QID PRN, Starla Link, Kshitiz, MD, 5 mL at 01/14/19 1543 .  MEDLINE mouth rinse, 15 mL, Mouth Rinse, q12n4p, Little Ishikawa, MD, 15 mL at 01/12/19 1252 .  megestrol (MEGACE) 400 MG/10ML suspension 400 mg, 400 mg, Per Tube, Daily, [place] Donnamarie Poag, DO, 400 mg at 01/16/19 1159 .  mirtazapine (REMERON SOL-TAB) disintegrating tablet 30 mg, 30 mg, Per NG tube, QHS, Little Ishikawa, MD, 30 mg at 01/15/19 2121 .  multivitamin liquid 15 mL, 15 mL, Per Tube, Daily, Little Ishikawa, MD, 15 mL at 01/15/19 0953 .  nystatin (MYCOSTATIN) 100000 UNIT/ML suspension 500,000 Units, 5 mL, Oral, 5 X Daily, Allie Bossier, MD, 500,000 Units at 01/16/19 0535 .  ondansetron (ZOFRAN) tablet 4 mg, 4 mg, Per NG tube, Q6H PRN **OR** ondansetron (ZOFRAN) injection 4 mg, 4 mg, Intravenous, Q6H PRN, Little Ishikawa, MD .  pantoprazole sodium (PROTONIX) 40 mg/20 mL oral suspension 40 mg, 40 mg, Per Tube, Daily, Little Ishikawa, MD, 40 mg at 01/14/19 1005 .  sodium chloride flush (NS) 0.9 % injection 3 mL, 3 mL, Intravenous, Q12H, Opyd, Ilene Qua, MD, 3 mL at 01/15/19 2122 .  sodium chloride flush (NS) 0.9 % injection 3 mL, 3 mL, Intravenous, PRN, Opyd, Ilene Qua, MD  . sodium chloride    . feeding supplement (OSMOLITE 1.2 CAL) 1,000 mL (01/14/19 1253)     Vital signs in last 24 hrs: Vitals:   01/15/19 2022 01/16/19 0533  BP: 127/88 136/78  Pulse: (!) 110 (!) 104  Resp: 20 18  Temp: 100 F (37.8 C) 98.6  F (37 C)  SpO2: 96% 100%    Intake/Output Summary (Last 24 hours) at 01/16/2019 1338 Last data filed at 01/15/2019 1750 Gross per 24 hour  Intake -  Output 150 ml  Net -150 ml     Physical Exam Cachectic, laying in bed, feeding tube in place  HEENT: sclera anicteric, oral mucosa dry , not well visualized.  No obvious thrush  Neck: supple, no thyromegaly, JVD or lymphadenopathy  Cardiac: RRR without murmurs, S1S2 heard, no peripheral edema.  Protective heel cushions in place  Pulm: clear to auscultation bilaterally, normal RR and poor inspiratory effort noted  Abdomen: soft, no apparent  tenderness, with active bowel sounds. No guarding or palpable hepatosplenomegaly  Skin; warm and dry, no jaundice  Recent Labs:  CBC Latest Ref Rng & Units 01/16/2019 01/15/2019 01/14/2019  WBC 4.0 - 10.5 K/uL 6.8 6.4 7.1  Hemoglobin 12.0 - 15.0 g/dL 9.5(L) 9.6(L) 10.1(L)  Hematocrit 36.0 - 46.0 % 27.0(L) 26.6(L) 29.9(L)  Platelets 150 - 400 K/uL 109(L) 101(L) 96(L)    Recent Labs  Lab 01/15/19 0444  INR 1.0   CMP Latest Ref Rng & Units 01/16/2019 01/14/2019 01/14/2019  Glucose 70 - 99 mg/dL 106(H) 103(H) 87  BUN 8 - 23 mg/dL 23 24(H) 26(H)  Creatinine 0.44 - 1.00 mg/dL 0.73 0.76 0.86  Sodium 135 - 145 mmol/L 135 136 134(L)  Potassium 3.5 - 5.1 mmol/L 4.0 4.2 4.7  Chloride 98 - 111 mmol/L 111 113(H) 110  CO2 22 - 32 mmol/L 18(L) 19(L) 17(L)  Calcium 8.9 - 10.3 mg/dL 7.6(L) 8.1(L) 8.4(L)  Total Protein 6.5 - 8.1 g/dL 5.0(L) - 5.6(L)  Total Bilirubin 0.3 - 1.2 mg/dL 0.6 - 1.0  Alkaline Phos 38 - 126 U/L 506(H) - 643(H)  AST 15 - 41 U/L 206(H) - 177(H)  ALT 0 - 44 U/L 113(H) - 75(H)   Albumin 1.7, largely unchanged from last week  Radiologic studies: Most recent right upper quadrant ultrasound with normal Doppler flow  @ASSESSMENTPLANBEGIN @ Assessment:  Elevated LFTs, cause unclear but suspected to initially have been related to sepsis picture, then more pronounced elevation of alkaline phosphatase related to drug-induced liver injury.  Antibiotics including Keflex suspected as possible culprit. Severe chronic generalized illness and protein calorie malnutrition. Recent lab and imaging results reviewed with daughter by phone, multiple questions answered.  It sounds like Sydney [name] is making slow improvement, but her prognosis remains guarded she has a long way to go.  Dr. Rush Landmark had started empiric nystatin therapy for odynophagia.  I suspect a nasoduodenal tube is a large part of the odynophagia.  Without definite yeast infection, I would NOT start fluconazole, given the  current elevation of liver labs.  LFTs can be checked every few days.  Her daughter was concerned about the lack of improvement in albumin, but I told her it would likely be weeks before significant improvement is seen in that lab study.  Plan: We will check back in a couple of days, call sooner as needed.  Total 30-minute time, over half spent in discussion with daughter updating on patient's condition.   Nelida Meuse III Office: [phone]

## 2019-01-16 NOTE — Progress Notes (Signed)
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 CT of the abdomen and pelvis was negative for acute intra-abdominal or pelvic abnormality. She was found to have mildly elevated LFTs along with hyponatremia and pancytopenia. She was given IV fluids.ID and GI were consulted. Palliative care was subsequently consulted for goals of care discussion at admission.  Multidisciplinary meeting today with medicine, administration, palliative care, GI, nursing staff, patient, patient's pastor, and patient advocate.  Lengthy discussion about patient's ongoing, albeit minimal, improvement with NG tube feeds status post antibiotics and supportive care.  Daughter remains some frustrated given no clear precipitating diagnosis which makes prognosis/timeline somewhat difficult to define.  Patient's daughter is requesting transfer to a different facility for possible further evaluation and work-up.  We discussed that given our extensive work-up here the only remaining evaluations left are MRCP, endoscopy and possibly liver biopsy, all of these would require likely anesthesia/sedation and risks would likely outweigh benefits at this point given patient's advanced age and weakened condition given poor p.o. intake.  Given the patient previously refused disposition to SNF and pending possible transition to outside facility, our other option for disposition would ultimately be home with daughter to continue tube feeds and supportive care as she sees fit.  At this time patient has no obvious acute illness from which she suffers which has caused her mental status change.  Given her improvement at  this time we would continue to focus on supportive care and NG tube feeds, as further work-up can be facilitated at an outpatient procedure or other facility at this time.  Hopefully over the next 24 to 48 hours patient can be reevaluated by PT and speech therapy with further engagement and recommendations about advancement of diet if safe.  Over the past 72 hours patient's mental status has minimally improved, over the previous week patient has only able to moan/calling out in pain.  Patient now able to shake her head yes or no to simple yes/no questions which seems to be appropriate, otherwise verbal status continues to be limited to weak 1 word answers which are quite unintelligible for the most part. Patient does have ongoing NG tube with medications and food per daughter's wishes. Patient has had extensive work-up as below including imaging, labs all of which essentially have been generally unremarkable for overt cause of patient's mental and physical decline. Patient recently treated for questionable UTI given febrile events and mental status changes in hopes that this will improve her status although very minimal if any improvement, patient continues to have markedly poor p.o. intake and likely concurrent depression ongoing. Patient is an advanced age, appears severely malnourished at this point -not consistent with any acute dietary changes but appears to be more chronic, previously refusing all medications and management although daughter continues to request ongoing treatment, multiple consults as well as ongoing evaluation despite extensive negative work-up as below. Per discussion with palliative care as well as GI it appears the patient's daughter is unfortunately unable to cope with the fact that her mother is actively dying. We continue to follow along closely, offer what support we can, ethics has been consulted given inappropriate requests by daughter for consults and treatment which in my  opinion would  be futile (requesting at one point for obgyn to evaluate for possible exam to rule out vaginal cancer which we discussed wouldn't likely be tolerated - patient then asked for blood work to rule out GU cancers -which certainly would not have any meaningful impact on her current disposition).  Additionally patient continues to be somewhat agitated, unclear if patient is in pain or simply agitated due to her worsening mental status, but daughter is requesting we hold off on any further pain medication or anxiolytics.  We continue to recommend to focus on comfort measures and comfort care at this time, but daughter continues to request further workup  I am doubtful that any further imaging, lab work or procedures at this time will change our trajectory.  We unfortunately do not have a working diagnosis for patient's decline other than markedly poor p.o. intake in the setting of likely depression other than possible UTI given recent fever and patient's mental status changes, but have now been treated appropriately with minimal improvement in patient's status.  Patient's daughter also continues to be fixated on single dose of 0.25mg  lorazepam and 0.5mg  of morphine on night shift on 01/04/19 and 01/05/19 respectively that she believes has caused her mother's current nonverbal condition despite our explanation that this medication would have likely cleared within a few hours of administration.  At this time patient's outlook remains grim, patient remains a full code despite discussion with family as patient had previously noted " I want everything done" which at this time precludes any further discussion about code/palliative/hospice.  Patient's mental status remains remarkably poor, patient now completed antibiotic coverage for questionable UTI.  Recent repeat abdominal imaging remarkable for ascites only, not surprising given patient's very low albumin level again in the setting of poor p.o. intake.  Patient  remains high risk for worsening mental status, aspiration, elevated risk of morbidity mortality given advanced age, chronic comorbid conditions and above.   Subjective: 9/21 alert, follows all commands.  Answer simple questions.  Allowed examination however mouth tongue and throat.    Assessment & Plan:   Principal Problem:   Adult failure to thrive Active Problems:   Pancytopenia (HCC)   Hyponatremia   Protein calorie malnutrition (HCC)   History of TIA (transient ischemic attack)   Generalized weakness   Pressure injury of skin   Transaminitis   Malnutrition of moderate degree   Evaluation by psychiatric service required   MDD (major depressive disorder)  Failure to thrive in adult/severe protein calorie malnutrition/hypoalbuminemia* - CT head negative for acute abnormality see results below -MRI brain negative acute stroke see results below - unclear etiology but possible component of depression.  See depression below - PT/OT recommends SNF.  Daughter declined - Poor oral intake during hospitalization - 9/8 core track placed.  Tube feedings at goal - Per EMR and hospital staff patient's interaction with myself the best they have seen her since admission. -9/18 overnight patient had what sounded like residuals from her core track tube feedings held.  KUB ordered and pending.  Will restart feeds as soon as safe. - Hopefully patient's cognition will continue to improve however if does not at some point will need to consider PEG tube placement (next 7 to 14 days).  If patient to become a SNF candidate will require PEG tube. 9/19 continue tube feeds, RN staff/daughter to encourage patient to take p.o. nystatin in effort to clear up her candidiasis. -9/21 attempted bedside swallow with myself and Estill Bamberg from speech patient 2-weak to  suck water through a straw.  Will attempt again in the a.m.  Hx TIA - Has not received Plavix since 12/24/2018 patient refusing. -Continue  aspirin  Acute metabolic encephalopathy -Multifactorial failure to thrive, infection, shock liver, metabolic derangement -0000000 appears to be improving slightly patient shakes her head yes and no to questions follows commands though refuses/cannot verbalize - Minimize sedating medication -9/21 patient reached up and shook my hand weakly, follows all commands, attempted swallow study.    Shock liver/elevated LFTs - GI following - Unclear etiology - AST/ALT minimally improved but still elevated - Hepatitis panel negative, EBV and CMV IgM negative - CT abdomen and pelvis negative evidence of acute intra-abdominal/pelvic abnormality.  Diffuse fluid within the colon suggestive of enteritis vs diarrheal illness (negative abdominal tenderness, or other GI symptoms) - Alkaline phosphatase, AST, ALT rising.  Most likely not secondary to antibiotics as they were discontinued 9/13.  Will continue to monitor along with GI - RUQ ultrasound showed gallbladder sludge see below - KUB on 12/29/2018 with mild diffuse gaseous prominence without obstructive pattern.  - Suspicion for possible mild shock liver given she was hypotensive on presentation.   - GI also believes that this transaminitis could reflect from skeletal muscle release as well - CK, MB minimally elevated -but improving - GI following, repeat CT on 01/06/2019 remarkable for ascites -9/18 discussed case with Dr. Justice Britain GI who spoken to daughter and plan is for liver Doppler ultrasound.  If abnormalities noted on ultrasound will most likely proceed with MRI/MRCP and possibly a liver biopsy.   Major depressive disorder - Recent loss of sister 2 months ago with extreme ataxia and failure to thrive, daughter continues to think that this therapy is completely wrong. -Psychiatry consulted recommended initiation of antidepressant therapy.  Patient nor daughter interested in initiating treatment.  UTI/yeast infection in groin and oral  thrush -Initially started on antibiotics but discontinued after urine culture showed insignificant growth - nystatin topical powder to groin - Nystatin swish and swallow 46ml  5 x per day or Clotrimazole atrocious 5 times daily - ID following patient - Hopefully in the next couple days patient's cognition will improve enough that speech can officially evaluate patient in order to advance diet. -  9/21 again patient continue to allow staff to treat her thrush with medication.   Leukopenia - Resolved  Thrombocytopenia -Most likely secondary to patient's liver dysfunction.  Currently no signs or symptoms of occult bleed - Started on vitamin B12 parenterally as she has had low levels of vitamin B12 during last admission -No further work-up indicated, patient can follow-up in outpatient setting with indicated -9/21 continues to improve  AKI -Resolved  Hyponatremia -Resolved  Hypokalemia -Resolved  Refeeding syndrome - Now that patient receiving nutrition continues to have metabolic derangements consistent with refeeding syndrome will need to watch closely all electrolytes.  Hypomagnesmia - Magnesium goal> 2  Hypophosphatemia -Resolved  Sacral cubitus ulcer stage II - Continue care per wound care recommendations  Loose stools - Afebrile , negative leukocytosis, negative abdominal pain.  No indication for C. difficile work-up -NOTE; previous history of C. difficile colitis back in 2018 in which she received a fecal transplant - Patient was covered by vancomycin per ID guidelines being treated for UTI as above - No further reported loose stools per nursing staff.   Ethics: - Patient now re-presenting after recent hospitalization, was home for roughly 1-2 days and returned with worsening weakness, severe dehydration, and adult failure to thrive.  Daughter  believes that she just will require some nutrition through artificial means to get her back to her previous baseline. She  states that her mother was independent with her ADLs and cooking for herself prior to admission (unclear timeframe).  Daughter states that her current decline was acute and only started on 12/28/2018; which is contrary to all nursing reports and previous provider reports during her care during this hospitalization (patient weighed 45kg over a month ago -admitted at 48 kg).  Daughter seems to be struggling with the notion that her mother is in the active dying process in regards to her adult failure to thrive in which she is refusing most if not all interventions and has not been eating or drinking now for weeks. - Given this patient's advanced age, repeat hospitalization in less than a few days from previous discharge, patient's prognosis is grim, and this has been relayed to the patient's daughter on multiple occasions by multiple providers. - Ethics has been consulted, following along, we appreciate insight recommendations on this difficult case     DVT prophylaxis: Lovenox/SCD Code Status: Full Family Communication: 9/20 spoke with daughter at length and discussed plan of care answered all questions Disposition Plan: TBD   Consultants:  ID GI Hematology Palliative care   Procedures/Significant Events:     I have personally reviewed and interpreted all radiology studies and my findings are as above.  VENTILATOR SETTINGS:    Cultures   Antimicrobials: Anti-infectives (From admission, onward)   Start     Stop   01/06/19 2200  cephALEXin (KEFLEX) 250 MG/5ML suspension 500 mg     01/08/19 2141   01/06/19 1045  ceFAZolin (ANCEF) IVPB 1 g/50 mL premix  Status:  Discontinued     01/06/19 1734   01/06/19 1000  vancomycin (VANCOCIN) 50 mg/mL oral solution 125 mg     01/08/19 1226   01/05/19 1200  cefTRIAXone (ROCEPHIN) 1 g in sodium chloride 0.9 % 100 mL IVPB  Status:  Discontinued     01/06/19 1037       Devices    LINES / TUBES:  Core track placed 9/8>>>    Continuous  Infusions:  sodium chloride     feeding supplement (OSMOLITE 1.2 CAL) 1,000 mL (01/14/19 1253)     Objective: Vitals:   01/15/19 0522 01/15/19 1416 01/15/19 2022 01/16/19 0533  BP: 128/68 (!) 142/75 127/88 136/78  Pulse: 94 97 (!) 110 (!) 104  Resp: 20 14 20 18   Temp: 98.6 F (37 C) 98.6 F (37 C) 100 F (37.8 C) 98.6 F (37 C)  TempSrc: Oral Oral Oral Oral  SpO2: 97% 98% 96% 100%  Weight:      Height:        Intake/Output Summary (Last 24 hours) at 01/16/2019 0856 Last data filed at 01/15/2019 1750 Gross per 24 hour  Intake --  Output 150 ml  Net -150 ml   Filed Weights   01/03/19 0500 01/04/19 0500 01/15/19 0500  Weight: 61 kg 61 kg 60 kg   Physical Exam:  General: Alert, follows commands, shook my hand weakly, attempted to cooperate with swallow study, no acute respiratory distress, cachectic Eyes: negative scleral hemorrhage, negative anisocoria, negative icterus ENT: Negative Runny nose, negative gingival bleeding, thrush appears to have resolved on tongue and side of mouth. Neck:  Negative scars, masses, torticollis, lymphadenopathy, JVD Lungs: Clear to auscultation bilaterally without wheezes or crackles Cardiovascular: Regular rate and rhythm without murmur gallop or rub normal S1 and S2 Abdomen:  negative abdominal pain, nondistended, positive soft, bowel sounds, no rebound, no ascites, no appreciable mass Extremities: No significant cyanosis, clubbing, or edema bilateral lower extremities Skin: Stage II sacral decubitus ulcer Psychiatric:  Negative depression, negative anxiety, negative fatigue, negative mania  Central nervous system:  Cranial nerves II through XII intact, tongue/uvula midline, all extremities muscle strength 3-4/5, sensation intact throughout, negative dysarthria, negative expressive aphasia, negative receptive aphasia.      Data Reviewed: Care during the described time interval was provided by me .  I have reviewed this patient's  available data, including medical history, events of note, physical examination, and all test results as part of my evaluation.   CBC: Recent Labs  Lab 01/12/19 0239 01/13/19 0256 01/14/19 0316 01/15/19 0444 01/16/19 0349  WBC 5.5 7.5 7.1 6.4 6.8  NEUTROABS  --  6.0 5.8 5.5 5.9  HGB 10.7* 10.8* 10.1* 9.6* 9.5*  HCT 29.0* 30.9* 29.9* 26.6* 27.0*  MCV 87.9 88.5 92.0 89.9 90.9  PLT 85* 94* 96* 101* 0000000*   Basic Metabolic Panel: Recent Labs  Lab 01/13/19 0256 01/13/19 1908 01/14/19 0316 01/14/19 1506 01/15/19 0444 01/16/19 0349  NA 134* 134* 134* 136  --  135  K 4.6 4.0 4.7 4.2  --  4.0  CL 110 110 110 113*  --  111  CO2 17* 19* 17* 19*  --  18*  GLUCOSE 117* 99 87 103*  --  106*  BUN 22 23 26* 24*  --  23  CREATININE 0.79 0.80 0.86 0.76  --  0.73  CALCIUM 8.3* 8.5* 8.4* 8.1*  --  7.6*  MG 1.7 2.3 2.2 1.9 2.3 2.0  PHOS 1.5* 2.0* 1.9* 1.6* 3.8 3.6   GFR: Estimated Creatinine Clearance: 41.8 mL/min (by C-G formula based on SCr of 0.73 mg/dL). Liver Function Tests: Recent Labs  Lab 01/12/19 0239 01/12/19 1939 01/13/19 0256 01/14/19 0316 01/16/19 0349  AST 169* 157* 183* 177* 206*  ALT 60* 62* 73* 75* 113*  ALKPHOS 728* 641* 751* 643* 506*  BILITOT 0.8 0.9 1.0 1.0 0.6  PROT 5.5* 5.0* 5.7* 5.6* 5.0*  ALBUMIN 1.7* 1.6* 1.9* 1.8* 1.7*   No results for input(s): LIPASE, AMYLASE in the last 168 hours. Recent Labs  Lab 01/12/19 1939  AMMONIA 22   Coagulation Profile: Recent Labs  Lab 01/10/19 0844 01/15/19 0444  INR 1.0 1.0   Cardiac Enzymes: Recent Labs  Lab 01/09/19 1016  CKTOTAL 150   BNP (last 3 results) No results for input(s): PROBNP in the last 8760 hours. HbA1C: No results for input(s): HGBA1C in the last 72 hours. CBG: Recent Labs  Lab 01/10/19 0835 01/11/19 0808 01/12/19 0751 01/13/19 0832  GLUCAP 96 90 106* 80   Lipid Profile: No results for input(s): CHOL, HDL, LDLCALC, TRIG, CHOLHDL, LDLDIRECT in the last 72 hours. Thyroid  Function Tests: No results for input(s): TSH, T4TOTAL, FREET4, T3FREE, THYROIDAB in the last 72 hours. Anemia Panel: No results for input(s): VITAMINB12, FOLATE, FERRITIN, TIBC, IRON, RETICCTPCT in the last 72 hours. Urine analysis:    Component Value Date/Time   COLORURINE YELLOW 01/08/2019 2015   APPEARANCEUR HAZY (A) 01/08/2019 2015   LABSPEC 1.018 01/08/2019 2015   PHURINE 6.0 01/08/2019 2015   GLUCOSEU NEGATIVE 01/08/2019 2015   HGBUR SMALL (A) 01/08/2019 2015   BILIRUBINUR NEGATIVE 01/08/2019 2015   KETONESUR NEGATIVE 01/08/2019 2015   PROTEINUR NEGATIVE 01/08/2019 2015   UROBILINOGEN 0.2 2020-11-3112 1048   NITRITE NEGATIVE 01/08/2019 2015   LEUKOCYTESUR NEGATIVE 01/08/2019  2015   Sepsis Labs: @LABRCNTIP (procalcitonin:4,lacticidven:4)  ) No results found for this or any previous visit (from the past 240 hour(s)).       Radiology Studies: No results found.      Scheduled Meds:  aspirin  81 mg Per Tube Daily   chlorhexidine  15 mL Mouth Rinse BID   collagenase   Topical Daily   enoxaparin (LOVENOX) injection  40 mg Subcutaneous [id number]   folic acid  1 mg Per Tube Daily   Gerhardt's butt cream   Topical TID   latanoprost  1 drop Both Eyes QHS   mouth rinse  15 mL Mouth Rinse q12n4p   megestrol  400 mg Per Tube Daily   mirtazapine  30 mg Per NG tube QHS   multivitamin  15 mL Per Tube Daily   nystatin  5 mL Oral 5 X Daily   pantoprazole sodium  40 mg Per Tube Daily   sodium chloride flush  3 mL Intravenous Q12H   Continuous Infusions:  sodium chloride     feeding supplement (OSMOLITE 1.2 CAL) 1,000 mL (01/14/19 1253)     LOS: 27 days   The patient is critically ill with multiple organ systems failure and requires high complexity decision making for assessment and support, frequent evaluation and titration of therapies, application of advanced monitoring technologies and extensive interpretation of multiple databases. Critical Care Time devoted  to patient care services described in this note  Time spent: 40 minutes     Huzaifa Viney, Geraldo Docker, MD [facility] Pager [phone]  If 7PM-7AM, please contact night-coverage [email or web address] Password [facility] 01/16/2019, 8:56 AM

## 2019-01-16 NOTE — Care Management Important Message (Signed)
Important Message  Patient Details  Name: Sydney [name] MRN: [id number] Date of Birth: 15-Jan-1931   Medicare Important Message Given:  Yes     [name] 01/16/2019, 5:02 PM

## 2019-01-16 NOTE — Consult Note (Signed)
[place] Nurse wound follow up Patient receiving care in [place].  Patient's daughter in room.  Assisted with turning by primary RN, [name] Wound type: unstageable PI to coccyx Measurement: 1.3 cm x 1.6 cm Wound bed: yellow with a tiny central spot that is pink.  Sydney [name] is working for debridement. Drainage (amount, consistency, odor) none Periwound: intact Dressing procedure/placement/frequency: continue Santyl Monitor the wound area(s) for worsening of condition such as: Signs/symptoms of infection,  Increase in size,  Development of or worsening of odor, Development of pain, or increased pain at the affected locations.  Notify the medical team if any of these develop. [name], RN, MSN, CWOCN, CNS-BC, pager [phone]

## 2019-01-16 NOTE — Progress Notes (Signed)
  Speech Language Pathology Treatment: Dysphagia  Patient Details Name: Sydney [name] MRN: [id number] DOB: 1930-07-23 Today's Date: 01/16/2019 Time: [id number] SLP Time Calculation (min) (ACUTE ONLY): 15 min  Assessment / Plan / Recommendation Clinical Impression  SLP has continued attempts to see Ms. Wuertz, who is resistant to eating or drinking.  Today, speech is more intelligible.  She was calling out to her daughter, Sydney [name], upon entering the room.  She declined POs offered because they were too cold.  Room-temperature water was also offered - pt declined, sealing lips and turning away.  Returned briefly after Dr. Sherral Hammers arrived and offered water again.  Pt did allow straw to enter mouth but did not draw/suck or consume any POs despite encouragement.  D/W Dr. Sherral Hammers; will continue efforts. Condition of tongue looks much better than when last evaluated by this clinician (8/27); hopefully comfort will improve and she will begin to take some PO.       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  CT of the abdomen and pelvis was negative for acute intra-abdominal or pelvic abnormality.  She was found to have mildly elevated LFTs along with hyponatremia and pancytopenia.  Pt is currently on Dys 3 plus supplements.  She has had worsening appetite since 2022-10-23; recent death of her sister has led to concerns for depression. MRI brain negative.  Pt with protein cal malnutrition, FTT; prognosis guarded to poor per MD notes. No new chest imaging since 8/27 evaluation.  Palliative and ethics have been consulted, pt remains full code and has NG at present      [place] with current plan of care       Recommendations  Diet recommendations: Dysphagia 3 (mechanical  soft);Thin liquid Liquids provided via: Cup Medication Administration: Via alternative means Supervision: Trained caregiver to feed patient Compensations: Minimize environmental distractions;Slow rate;Small sips/bites Postural Changes and/or Swallow Maneuvers: Seated upright 90 degrees                Oral Care Recommendations: Oral care QID Plan: Continue with current plan of care       GO                Sydney [name] 01/16/2019, 9:27 AM  Estill Bamberg L. Tivis Ringer, [place] Office number [phone] Pager [phone]

## 2019-01-17 ENCOUNTER — Inpatient Hospital Stay (HOSPITAL_COMMUNITY): Payer: Medicare Other

## 2019-01-17 DIAGNOSIS — R627 Adult failure to thrive: Secondary | ICD-10-CM | POA: Diagnosis not present

## 2019-01-17 DIAGNOSIS — E860 Dehydration: Secondary | ICD-10-CM | POA: Diagnosis not present

## 2019-01-17 DIAGNOSIS — R945 Abnormal results of liver function studies: Secondary | ICD-10-CM | POA: Diagnosis not present

## 2019-01-17 DIAGNOSIS — R531 Weakness: Secondary | ICD-10-CM | POA: Diagnosis not present

## 2019-01-17 LAB — CBC WITH DIFFERENTIAL/PLATELET
Abs Immature Granulocytes: 0.06 10*3/uL (ref 0.00–0.07)
Basophils Absolute: 0 10*3/uL (ref 0.0–0.1)
Basophils Relative: 0 %
Eosinophils Absolute: 0 10*3/uL (ref 0.0–0.5)
Eosinophils Relative: 0 %
HCT: 28.1 % — ABNORMAL LOW (ref 36.0–46.0)
Hemoglobin: 9.3 g/dL — ABNORMAL LOW (ref 12.0–15.0)
Immature Granulocytes: 1 %
Lymphocytes Relative: 6 %
Lymphs Abs: 0.4 10*3/uL — ABNORMAL LOW (ref 0.7–4.0)
MCH: 31.7 pg (ref 26.0–34.0)
MCHC: 33.1 g/dL (ref 30.0–36.0)
MCV: 95.9 fL (ref 80.0–100.0)
Monocytes Absolute: 0.4 10*3/uL (ref 0.1–1.0)
Monocytes Relative: 6 %
Neutro Abs: 6.2 10*3/uL (ref 1.7–7.7)
Neutrophils Relative %: 87 %
Platelets: 109 10*3/uL — ABNORMAL LOW (ref 150–400)
RBC: 2.93 MIL/uL — ABNORMAL LOW (ref 3.87–5.11)
RDW: 18.4 % — ABNORMAL HIGH (ref 11.5–15.5)
WBC: 7.1 10*3/uL (ref 4.0–10.5)
nRBC: 0.4 % — ABNORMAL HIGH (ref 0.0–0.2)

## 2019-01-17 LAB — COMPREHENSIVE METABOLIC PANEL
ALT: 143 U/L — ABNORMAL HIGH (ref 0–44)
AST: 253 U/L — ABNORMAL HIGH (ref 15–41)
Albumin: 1.8 g/dL — ABNORMAL LOW (ref 3.5–5.0)
Alkaline Phosphatase: 490 U/L — ABNORMAL HIGH (ref 38–126)
Anion gap: 6 (ref 5–15)
BUN: 23 mg/dL (ref 8–23)
CO2: 18 mmol/L — ABNORMAL LOW (ref 22–32)
Calcium: 7.6 mg/dL — ABNORMAL LOW (ref 8.9–10.3)
Chloride: 114 mmol/L — ABNORMAL HIGH (ref 98–111)
Creatinine, Ser: 0.65 mg/dL (ref 0.44–1.00)
GFR calc Af Amer: >60 mL/min (ref >60)
GFR calc non Af Amer: >60 mL/min (ref >60)
Glucose, Bld: 102 mg/dL — ABNORMAL HIGH (ref 70–99)
Potassium: 4.1 mmol/L (ref 3.5–5.1)
Sodium: 138 mmol/L (ref 135–145)
Total Bilirubin: 0.8 mg/dL (ref 0.3–1.2)
Total Protein: 5.5 g/dL — ABNORMAL LOW (ref 6.5–8.1)

## 2019-01-17 LAB — PHOSPHORUS: Phosphorus: 2.7 mg/dL (ref 2.5–4.6)

## 2019-01-17 LAB — GLUCOSE, CAPILLARY: Glucose-Capillary: 94 mg/dL (ref 70–99)

## 2019-01-17 LAB — MAGNESIUM: Magnesium: 1.9 mg/dL (ref 1.7–2.4)

## 2019-01-17 MED ORDER — IOHEXOL 300 MG/ML  SOLN
50.0000 mL | Freq: Once | INTRAMUSCULAR | Status: AC | PRN
  Administered 2019-01-17: 20 mL

## 2019-01-17 MED ORDER — LIDOCAINE VISCOUS HCL 2 % MT SOLN
15.0000 mL | Freq: Once | OROMUCOSAL | Status: AC
  Administered 2019-01-17: 15:00:00 6 mL via OROMUCOSAL
  Filled 2019-01-17: qty 15

## 2019-01-17 MED ORDER — LIDOCAINE VISCOUS HCL 2 % MT SOLN
OROMUCOSAL | Status: AC
  Administered 2019-01-17: 6 mL via OROMUCOSAL
  Filled 2019-01-17: qty 15

## 2019-01-17 NOTE — TOC Progression Note (Signed)
Transition of Care [facility]) - Progression Note    Patient Details  Name: Sydney [name] MRN: [id number] Date of Birth: 08-31-1930  [facility] CM/SW Contact  Jacalyn Lefevre Edson Snowball, RN Phone Number: 01/17/2019, 1:48 PM  Clinical Narrative:      Spoke to Dr Sherral Hammers. Plan to give patient a few more days on tube feedings and re eval for PEG.   Mateo Flow with [place] updated. Expected Discharge Plan: [place]    Expected Discharge Plan and Services Expected Discharge Plan: [place] Choice: [place] arrangements for the past 2 months: Single Family Home                 DME Arranged: N/A         HH Arranged: PT, OT, RN [place] Agency: [place] ([facility]) Date HH Agency Contacted: 12/21/18 Time [place]: [place] Representative spoke with at [place]: [place] ([place]) Interventions    Readmission Risk Interventions No flowsheet data found.

## 2019-01-17 NOTE — Progress Notes (Signed)
  Speech Language Pathology Treatment: Dysphagia  Patient Details Name: Sydney [name] MRN: [id number] DOB: May 17, 1930 Today's Date: 01/17/2019 Time: [id number] SLP Time Calculation (min) (ACUTE ONLY): 12 min  Assessment / Plan / Recommendation Clinical Impression  Received call from RN that pt's cortrak became dislodged.  Pt alert, amenable to trying some POs this am.  Provided trials of teaspoons of warm tomato soup (pt has expressed dislike of cold temperatures) and room-temperature water.  Pt demonstrated recognition/acceptance of POs, then oral holding of material, no palpable swallow response,  and eventual expectoration of material. This same pattern occurred on 4/4 trials.  Oral cavity was cleaned/suctioned and trials ceased.  Pt's oral cavity looks much better (thrush not apparent on tongue); she does not describe odynophagia. However, there is clearly a reluctance to swallow - whether that is behavioral and related to cognitive changes or physiological in nature is difficult to identify.    Recommend that staff continue to offer POs casually and according to pt's food/drink preferences. D/W RN. SLP will continue our efforts; however, there may be little help we can offer if pt is not accepting food/liquid.   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  CT of the abdomen and pelvis was negative for acute intra-abdominal or pelvic abnormality.  She was found to have mildly elevated LFTs along with hyponatremia and pancytopenia.  Pt is currently on Dys 3 plus supplements.  She has had worsening appetite since 16-Nov-2022; recent death of her sister has led to concerns for depression. MRI brain negative.  Pt with protein cal malnutrition, FTT; prognosis guarded to poor per  MD notes. No new chest imaging since 8/27 evaluation.  Palliative and ethics have been consulted, pt remains full code and has NG at present      [place] with current plan of care       Recommendations  Diet recommendations: Dysphagia 3 (mechanical soft);Thin liquid Liquids provided via: Cup Medication Administration: Crushed with puree Supervision: Trained caregiver to feed patient Compensations: Minimize environmental distractions;Slow rate;Small sips/bites Postural Changes and/or Swallow Maneuvers: Seated upright 90 degrees                Oral Care Recommendations: Oral care QID Plan: Continue with current plan of care       GO              Faythe Heitzenrater L. Tivis Ringer, [place] CCC/SLP Acute Rehabilitation Services Office number [phone] Pager [phone]   Assunta Curtis 01/17/2019, 10:33 AM

## 2019-01-17 NOTE — Progress Notes (Signed)
  Speech Language Pathology Treatment: Dysphagia  Patient Details Name: Sydney [name] MRN: [id number] DOB: 02-Feb-1931 Today's Date: 01/17/2019 Time: [id number] SLP Time Calculation (min) (ACUTE ONLY): 40 min  Assessment / Plan / Recommendation Clinical Impression  Returned to room for re-assessment of swallow per pt's daughter, Sydney [name]'s request.  Collaborated with Sydney [name] to find foods/fluids her mother might prefer - decided on chicken broth and Peach Boost.  Dr. Sherral Hammers present.  Pt positioned upright.  She continued to swish and spit water as earlier today.  When offered Boost from a straw, she actively accepted straw, withdrew liquid, and swallow was palpated.  There were no overt s/s of aspiration.   Discussed cognition and its negative impact on swallowing; defined aspiration and potential to aspirate secretions when there is AMS.  Sydney [name]'s ultimate goal is to take her mother home.  She prefers to replace NG today and is considering a PEG. I reviewed recommendations to continue PO intake per Sydney [name]'s desires.  Difficulty with accepting/expectorating POs is likely related to MS at any given time.  At this time, she appears to be protecting her airway. Family/staff should casually offer POs but avoid bombarding her with commands or persuasion to eat or drink.  Sydney [name] verbalized understanding. Explained that SLP service will sign off at this time.     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  CT of the abdomen and pelvis was negative for acute intra-abdominal or pelvic abnormality.  She was found to have mildly elevated LFTs along with hyponatremia and pancytopenia.  Pt is currently on Dys 3 plus supplements.  She has had worsening appetite since 10/24/2022; recent  death of her sister has led to concerns for depression. MRI brain negative.  Pt with protein cal malnutrition, FTT; prognosis guarded to poor per MD notes. No new chest imaging since 8/27 evaluation.  Palliative and ethics have been consulted, pt remains full code and has NG at present      SLP Plan  Discharge SLP treatment due to (comment) - limited progress/acceptance of POs; currently demonstrating safety with intake; no s/s aspiration.  Refusals to eat are likely cognitively-based.        Recommendations  Diet recommendations: Dysphagia 3 (mechanical soft);Thin liquid Liquids provided via: Cup;Straw Medication Administration: Crushed with puree Supervision: Staff to assist with self feeding Compensations: Minimize environmental distractions;Slow rate;Small sips/bites Postural Changes and/or Swallow Maneuvers: Seated upright 90 degrees                Oral Care Recommendations: Oral care BID Follow up Recommendations: None SLP Visit Diagnosis: Dysphagia, unspecified (R13.10) Plan: Discharge SLP treatment due to (comment)       GO                Sydney [name] 01/17/2019, 1:09 PM  Loretta Doutt L. Tivis Ringer, [place] Office number [phone] Pager [phone]

## 2019-01-17 NOTE — Progress Notes (Addendum)
Walked by patient's room and noticed cortrak was out of the patients nose and laying in the bed. Dr. Sherral Hammers notified, he states we will plan for a swallow evaluation while the cortrak is removed and then plan to replace the cortrak to meet her caloric intake.   [phone]- Paged Amanda for swallow eval. Awaiting response.

## 2019-01-17 NOTE — Progress Notes (Signed)
Nutrition Follow-up  RD working remotely.  DOCUMENTATION CODES:   Severe malnutrition in context of chronic illness  INTERVENTION:   -ContinueOsmolite 1.2'@45ml' /hr   Tube feeding regimen provides1296kcal (100% of needs),60grams of protein, and 866m of H2O.  NUTRITION DIAGNOSIS:   Severe Malnutrition related to chronic illness(TIA) as evidenced by energy intake < or equal to 75% for > or equal to 1 month, moderate fat depletion, severe fat depletion, moderate muscle depletion, severe muscle depletion.  Ongoing  GOAL:   Patient will meet greater than or equal to 90% of their needs  Met with TF  MONITOR:   PO intake, Supplement acceptance, Diet advancement, Labs, Weight trends, TF tolerance, Skin, I & O's  REASON FOR ASSESSMENT:   Consult Enteral/tube feeding initiation and management  ASSESSMENT:   Sydney WELLIVERis a 85y.o. 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  Patient is accompanied by her daughter who assists with the history.  Patient has history of recurrent C. difficile colitis, has continued to have loose stools, though this has improved to 4 or 5/day, has had progressively worsening appetite, eating only 3 or 4 bites and unable to even drink half of a nutritional supplement drink.  Patient denies any abdominal pain since the recent hospitalization, no fevers or chills have been noted, and she denies any dysuria though she may have complained of this a few days ago.  She was requiring assistance with ambulation and had home health PT set up at recent discharge, but has become even more weak in general since leaving the hospital and had a near syncopal episode today when she tried to get up  from a bedside commode.  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   Per chart review, pt is a little more interactive compared to last week. SLP continues efforts, however, pt generally refuses oral care and PO trials. GI has started nyastin for odynophagia. She remains on nutrition support via cortrak tube. Osmolite 1.2 infusing via cortrak tube @ 45 ml/hr, which is meeting 100% of estimated kcal and protein needs. Cortrak tube is a temporary source of alternative means of nutrition/hydration and is not recommended that tube be in place for greater than 30 days.   Per MD notes, PEG is being considered over the next 1-2 weeks if pt's swallow function and PO intake do not progress.   Labs reviewed: CBGS: 94. Mg, K, and Phos WDL.   Diet Order:   Diet Order    None      EDUCATION NEEDS:   Not appropriate for education at this time  Skin:  Skin Assessment: Skin Integrity Issues: Skin Integrity Issues:: Stage II, Unstageable Stage II: sacrum Unstageable: coccyx  Last BM:  01/16/19  Height:   Ht Readings from Last 1 Encounters:  12/20/18 '5\' 3"'  (1.6 m)    Weight:   Wt Readings from  Last 1 Encounters:  01/15/19 60 kg    Ideal Body Weight:  52.3 kg  BMI:  Body mass index is 23.43 kg/m.  Estimated Nutritional Needs:   Kcal:  1250-1450  Protein:  60-75 grams  Fluid:  > 1.2 L    Sydney [name], RD, LDN, [place] Registered Dietitian II Certified Diabetes Care and Education Specialist Pager: [phone] After hours Pager: [phone]

## 2019-01-17 NOTE — Progress Notes (Signed)
Cortrak Tube Team Note:  Consult received to place a Cortrak feeding tube.  A 10 fr tube was placed by diagnostic radiology in the right nare at 115 cm marking. RD secured tube with bridle.   Mariana Single RD, LDN Clinical Nutrition Pager # [phone]

## 2019-01-17 NOTE — Progress Notes (Signed)
Responded to request from patient daughter meet with her to share outcomes of meeting with medical team.  Patient daughter expressed that she was pleased with outcome of meeting and felt that she was heard.  Daughter felt that medical team was dismissing her mother and treating her as if she just another old person ready to die.  She indicated she just wanted clarity and to feel that the medical staff was really trying to help mother get better.  I provided daughter wit prayer, support and guidance.  Chaplain available as needed.  Jaclynn Major, [place], [facility], Pager [phone]

## 2019-01-17 NOTE — Progress Notes (Signed)
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 CT of the abdomen and pelvis was negative for acute intra-abdominal or pelvic abnormality. She was found to have mildly elevated LFTs along with hyponatremia and pancytopenia. She was given IV fluids.ID and GI were consulted. Palliative care was subsequently consulted for goals of care discussion at admission.  Multidisciplinary meeting today with medicine, administration, palliative care, GI, nursing staff, patient, patient's pastor, and patient advocate.  Lengthy discussion about patient's ongoing, albeit minimal, improvement with NG tube feeds status post antibiotics and supportive care.  Daughter remains some frustrated given no clear precipitating diagnosis which makes prognosis/timeline somewhat difficult to define.  Patient's daughter is requesting transfer to a different facility for possible further evaluation and work-up.  We discussed that given our extensive work-up here the only remaining evaluations left are MRCP, endoscopy and possibly liver biopsy, all of these would require likely anesthesia/sedation and risks would likely outweigh benefits at this point given patient's advanced age and weakened condition given poor p.o. intake.  Given the patient previously refused disposition to SNF and pending possible transition to outside facility, our other option for disposition would ultimately be home with daughter to continue tube feeds and supportive care as she sees fit.  At this time patient has no obvious acute illness from which she suffers which has caused her mental status change.  Given her improvement at  this time we would continue to focus on supportive care and NG tube feeds, as further work-up can be facilitated at an outpatient procedure or other facility at this time.  Hopefully over the next 24 to 48 hours patient can be reevaluated by PT and speech therapy with further engagement and recommendations about advancement of diet if safe.  Over the past 72 hours patient's mental status has minimally improved, over the previous week patient has only able to moan/calling out in pain.  Patient now able to shake her head yes or no to simple yes/no questions which seems to be appropriate, otherwise verbal status continues to be limited to weak 1 word answers which are quite unintelligible for the most part. Patient does have ongoing NG tube with medications and food per daughter's wishes. Patient has had extensive work-up as below including imaging, labs all of which essentially have been generally unremarkable for overt cause of patient's mental and physical decline. Patient recently treated for questionable UTI given febrile events and mental status changes in hopes that this will improve her status although very minimal if any improvement, patient continues to have markedly poor p.o. intake and likely concurrent depression ongoing. Patient is an advanced age, appears severely malnourished at this point -not consistent with any acute dietary changes but appears to be more chronic, previously refusing all medications and management although daughter continues to request ongoing treatment, multiple consults as well as ongoing evaluation despite extensive negative work-up as below. Per discussion with palliative care as well as GI it appears the patient's daughter is unfortunately unable to cope with the fact that her mother is actively dying. We continue to follow along closely, offer what support we can, ethics has been consulted given inappropriate requests by daughter for consults and treatment which in my  opinion would  be futile (requesting at one point for obgyn to evaluate for possible exam to rule out vaginal cancer which we discussed wouldn't likely be tolerated - patient then asked for blood work to rule out GU cancers -which certainly would not have any meaningful impact on her current disposition).  Additionally patient continues to be somewhat agitated, unclear if patient is in pain or simply agitated due to her worsening mental status, but daughter is requesting we hold off on any further pain medication or anxiolytics.  We continue to recommend to focus on comfort measures and comfort care at this time, but daughter continues to request further workup  I am doubtful that any further imaging, lab work or procedures at this time will change our trajectory.  We unfortunately do not have a working diagnosis for patient's decline other than markedly poor p.o. intake in the setting of likely depression other than possible UTI given recent fever and patient's mental status changes, but have now been treated appropriately with minimal improvement in patient's status.  Patient's daughter also continues to be fixated on single dose of 0.25mg  lorazepam and 0.5mg  of morphine on night shift on 01/04/19 and 01/05/19 respectively that she believes has caused her mother's current nonverbal condition despite our explanation that this medication would have likely cleared within a few hours of administration.  At this time patient's outlook remains grim, patient remains a full code despite discussion with family as patient had previously noted " I want everything done" which at this time precludes any further discussion about code/palliative/hospice.  Patient's mental status remains remarkably poor, patient now completed antibiotic coverage for questionable UTI.  Recent repeat abdominal imaging remarkable for ascites only, not surprising given patient's very low albumin level again in the setting of poor p.o. intake.  Patient  remains high risk for worsening mental status, aspiration, elevated risk of morbidity mortality given advanced age, chronic comorbid conditions and above.   Subjective: 9/22 alert, follows commands, tells you what she wants and does not want.  Extremely weak but was able to swallow some water and some juice.    Assessment & Plan:   Principal Problem:   Adult failure to thrive Active Problems:   Pancytopenia (HCC)   Hyponatremia   Protein calorie malnutrition (HCC)   History of TIA (transient ischemic attack)   Generalized weakness   Pressure injury of skin   Transaminitis   Malnutrition of moderate degree   Evaluation by psychiatric service required   MDD (major depressive disorder)  Failure to thrive in adult/severe protein calorie malnutrition/hypoalbuminemia* - CT head negative for acute abnormality see results below -MRI brain negative acute stroke see results below - unclear etiology but possible component of depression.  See depression below - PT/OT recommends SNF.  Daughter declined - Poor oral intake during hospitalization - 9/8 core track placed.  Tube feedings at goal - Per EMR and hospital staff patient's interaction with myself the best they have seen her since admission. -9/18 overnight patient had what sounded like residuals from her core track tube feedings held.  KUB ordered and pending.  Will restart feeds as soon as safe. - Hopefully patient's cognition will continue to improve however if does not at some point will need to consider PEG tube placement (next 7 to 14 days).  If patient to become a SNF candidate will require PEG tube. 9/19 continue tube feeds, RN staff/daughter to encourage patient to take p.o. nystatin in effort to clear up her candidiasis. -9/22 bedside swallow more  successful.  Dysphagia 3 diet.  Unfortunately patient so weak will not be able to meet her calorie intake just through p.o feeds. - 9/22 patient accidentally removed core track tube.   IR to reinstall tube today.  Restart tube feeds once reinstalled.  Also encourage patient p.o. intake..   -Spoke with [name] daughter concerning placement of PEG tube, if patient not able to take in sufficient calories by Friday would ask GI to reassess feasibility of placing temporary PEG tube.  Hx TIA - Has not received Plavix since 12/24/2018 patient refusing. -Continue aspirin  Acute metabolic encephalopathy -Multifactorial failure to thrive, infection, shock liver, metabolic derangement -0000000 appears to be improving slightly patient shakes her head yes and no to questions follows commands though refuses/cannot verbalize - Minimize sedating medication -9/22 continues to wax and wane but appears clear every day.  Shock liver/elevated LFTs - GI following - Unclear etiology - AST/ALT minimally improved but still elevated - Hepatitis panel negative, EBV and CMV IgM negative - CT abdomen and pelvis negative evidence of acute intra-abdominal/pelvic abnormality.  Diffuse fluid within the colon suggestive of enteritis vs diarrheal illness (negative abdominal tenderness, or other GI symptoms) - Alkaline phosphatase, AST, ALT rising.  Most likely not secondary to antibiotics as they were discontinued 9/13.  Will continue to monitor along with GI - RUQ ultrasound showed gallbladder sludge see below - KUB on 12/29/2018 with mild diffuse gaseous prominence without obstructive pattern.  - Suspicion for possible mild shock liver given she was hypotensive on presentation.   - GI also believes that this transaminitis could reflect from skeletal muscle release as well - CK, MB minimally elevated -but improving - GI following, repeat CT on 01/06/2019 remarkable for ascites -9/18 discussed case with Dr. Justice Britain GI who spoken to daughter and plan is for liver Doppler ultrasound.  If abnormalities noted on ultrasound will most likely proceed with MRI/MRCP and possibly a liver biopsy. -9/22 GI currently  watch for waiting, family not wanting an aggressive procedure performed (patient would not do well).   Major depressive disorder - Recent loss of sister 2 months ago with extreme ataxia and failure to thrive, daughter continues to think that this therapy is completely wrong. -Psychiatry consulted recommended initiation of antidepressant therapy.  Patient nor daughter interested in initiating treatment.  UTI/yeast infection in groin and oral thrush -Initially started on antibiotics but discontinued after urine culture showed insignificant growth - nystatin topical powder to groin - Nystatin swish and swallow 32ml  5 x per day or Clotrimazole atrocious 5 times daily - ID following patient - Hopefully in the next couple days patient's cognition will improve enough that speech can officially evaluate patient in order to advance diet. -  9/21 again patient continue to allow staff to treat her thrush with medication.   Leukopenia - Resolved  Thrombocytopenia -Most likely secondary to patient's liver dysfunction.  Currently no signs or symptoms of occult bleed - Started on vitamin B12 parenterally as she has had low levels of vitamin B12 during last admission -No further work-up indicated, patient can follow-up in outpatient setting with indicated -9/21 continues to improve  AKI -Resolved  Hyponatremia -Resolved  Hypokalemia -Resolved  Refeeding syndrome - Now that patient receiving nutrition continues to have metabolic derangements consistent with refeeding syndrome will need to watch closely all electrolytes.  Hypomagnesmia - Magnesium goal> 2  Hypophosphatemia -Resolved  Sacral cubitus ulcer stage II - Continue care per wound care recommendations  Loose stools - Afebrile , negative  leukocytosis, negative abdominal pain.  No indication for C. difficile work-up -NOTE; previous history of C. difficile colitis back in 2018 in which she received a fecal transplant - Patient was  covered by vancomycin per ID guidelines being treated for UTI as above - No further reported loose stools per nursing staff.   Ethics: - Patient now re-presenting after recent hospitalization, was home for roughly 1-2 days and returned with worsening weakness, severe dehydration, and adult failure to thrive.  Daughter believes that she just will require some nutrition through artificial means to get her back to her previous baseline. She states that her mother was independent with her ADLs and cooking for herself prior to admission (unclear timeframe).  Daughter states that her current decline was acute and only started on 12/28/2018; which is contrary to all nursing reports and previous provider reports during her care during this hospitalization (patient weighed 45kg over a month ago -admitted at 48 kg).  Daughter seems to be struggling with the notion that her mother is in the active dying process in regards to her adult failure to thrive in which she is refusing most if not all interventions and has not been eating or drinking now for weeks. - Given this patient's advanced age, repeat hospitalization in less than a few days from previous discharge, patient's prognosis is grim, and this has been relayed to the patient's daughter on multiple occasions by multiple providers. - Ethics has been consulted, following along, we appreciate insight recommendations on this difficult case     DVT prophylaxis: Lovenox/SCD Code Status: Full Family Communication: 9/22 spoke with daughter at length and discussed plan of care answered all questions Disposition Plan: TBD   Consultants:  ID GI Hematology Palliative care IR   Procedures/Significant Events:     I have personally reviewed and interpreted all radiology studies and my findings are as above.  VENTILATOR SETTINGS:    Cultures   Antimicrobials: Anti-infectives (From admission, onward)   Start     Stop   01/06/19 2200  cephALEXin  (KEFLEX) 250 MG/5ML suspension 500 mg     01/08/19 2141   01/06/19 1045  ceFAZolin (ANCEF) IVPB 1 g/50 mL premix  Status:  Discontinued     01/06/19 1734   01/06/19 1000  vancomycin (VANCOCIN) 50 mg/mL oral solution 125 mg     01/08/19 1226   01/05/19 1200  cefTRIAXone (ROCEPHIN) 1 g in sodium chloride 0.9 % 100 mL IVPB  Status:  Discontinued     01/06/19 1037       Devices    LINES / TUBES:  Core track placed 9/8>>>    Continuous Infusions:  sodium chloride     feeding supplement (OSMOLITE 1.2 CAL) 1,000 mL (01/16/19 2030)     Objective: Vitals:   01/15/19 2022 01/16/19 0533 01/16/19 2053 01/17/19 0648  BP: 127/88 136/78 118/60 (!) 142/98  Pulse: (!) 110 (!) 104 (!) 101 (!) 110  Resp: 20 18 20 20   Temp: 100 F (37.8 C) 98.6 F (37 C) 99.1 F (37.3 C) 98.6 F (37 C)  TempSrc: Oral Oral Oral Oral  SpO2: 96% 100% 97% 94%  Weight:      Height:       No intake or output data in the 24 hours ending 01/17/19 1121 Filed Weights   01/03/19 0500 01/04/19 0500 01/15/19 0500  Weight: 61 kg 61 kg 60 kg    Physical Exam:  General: Alert, follows commands, able to pass swallow study, no acute  respiratory distress Eyes: negative scleral hemorrhage, negative anisocoria, negative icterus ENT: Negative Runny nose, negative gingival bleeding, Neck:  Negative scars, masses, torticollis, lymphadenopathy, JVD Lungs: Clear to auscultation bilaterally without wheezes or crackles Cardiovascular: Regular rate and rhythm without murmur gallop or rub normal S1 and S2 Abdomen: negative abdominal pain, nondistended, positive soft, bowel sounds, no rebound, no ascites, no appreciable mass Extremities: No significant cyanosis, clubbing, or edema bilateral lower extremities Skin: Stage II sacral decubitus ulcer Psychiatric:  Negative depression, negative anxiety, negative fatigue, negative mania  Central nervous system:  Cranial nerves II through XII intact, tongue/uvula midline, all  extremities muscle strength 3-4 5/5, sensation intact throughout, negative dysarthria, negative expressive aphasia, negative receptive aphasia.       Data Reviewed: Care during the described time interval was provided by me .  I have reviewed this patient's available data, including medical history, events of note, physical examination, and all test results as part of my evaluation.   CBC: Recent Labs  Lab 01/13/19 0256 01/14/19 0316 01/15/19 0444 01/16/19 0349 01/17/19 0502  WBC 7.5 7.1 6.4 6.8 7.1  NEUTROABS 6.0 5.8 5.5 5.9 6.2  HGB 10.8* 10.1* 9.6* 9.5* 9.3*  HCT 30.9* 29.9* 26.6* 27.0* 28.1*  MCV 88.5 92.0 89.9 90.9 95.9  PLT 94* 96* 101* 109* 0000000*   Basic Metabolic Panel: Recent Labs  Lab 01/13/19 1908 01/14/19 0316 01/14/19 1506 01/15/19 0444 01/16/19 0349 01/17/19 0502  NA 134* 134* 136  --  135 138  K 4.0 4.7 4.2  --  4.0 4.1  CL 110 110 113*  --  111 114*  CO2 19* 17* 19*  --  18* 18*  GLUCOSE 99 87 103*  --  106* 102*  BUN 23 26* 24*  --  23 23  CREATININE 0.80 0.86 0.76  --  0.73 0.65  CALCIUM 8.5* 8.4* 8.1*  --  7.6* 7.6*  MG 2.3 2.2 1.9 2.3 2.0 1.9  PHOS 2.0* 1.9* 1.6* 3.8 3.6 2.7   GFR: Estimated Creatinine Clearance: 41.8 mL/min (by C-G formula based on SCr of 0.65 mg/dL). Liver Function Tests: Recent Labs  Lab 01/12/19 1939 01/13/19 0256 01/14/19 0316 01/16/19 0349 01/17/19 0502  AST 157* 183* 177* 206* 253*  ALT 62* 73* 75* 113* 143*  ALKPHOS 641* 751* 643* 506* 490*  BILITOT 0.9 1.0 1.0 0.6 0.8  PROT 5.0* 5.7* 5.6* 5.0* 5.5*  ALBUMIN 1.6* 1.9* 1.8* 1.7* 1.8*   No results for input(s): LIPASE, AMYLASE in the last 168 hours. Recent Labs  Lab 01/12/19 1939  AMMONIA 22   Coagulation Profile: Recent Labs  Lab 01/15/19 0444  INR 1.0   Cardiac Enzymes: No results for input(s): CKTOTAL, CKMB, CKMBINDEX, TROPONINI in the last 168 hours. BNP (last 3 results) No results for input(s): PROBNP in the last 8760 hours. HbA1C: No results  for input(s): HGBA1C in the last 72 hours. CBG: Recent Labs  Lab 01/11/19 0808 01/12/19 0751 01/13/19 0832 01/17/19 0810  GLUCAP 90 106* 80 94   Lipid Profile: No results for input(s): CHOL, HDL, LDLCALC, TRIG, CHOLHDL, LDLDIRECT in the last 72 hours. Thyroid Function Tests: No results for input(s): TSH, T4TOTAL, FREET4, T3FREE, THYROIDAB in the last 72 hours. Anemia Panel: No results for input(s): VITAMINB12, FOLATE, FERRITIN, TIBC, IRON, RETICCTPCT in the last 72 hours. Urine analysis:    Component Value Date/Time   COLORURINE YELLOW 01/08/2019 2015   APPEARANCEUR HAZY (A) 01/08/2019 2015   LABSPEC 1.018 01/08/2019 2015   PHURINE 6.0 01/08/2019 2015  GLUCOSEU NEGATIVE 01/08/2019 2015   HGBUR SMALL (A) 01/08/2019 2015   BILIRUBINUR NEGATIVE 01/08/2019 2015   [place] 01/08/2019 2015   PROTEINUR NEGATIVE 01/08/2019 2015   UROBILINOGEN 0.2 03/10/2013 1048   NITRITE NEGATIVE 01/08/2019 2015   LEUKOCYTESUR NEGATIVE 01/08/2019 2015   Sepsis Labs: @LABRCNTIP (procalcitonin:4,lacticidven:4)  ) No results found for this or any previous visit (from the past 240 hour(s)).       Radiology Studies: No results found.      Scheduled Meds:  aspirin  81 mg Per Tube Daily   chlorhexidine  15 mL Mouth Rinse BID   collagenase   Topical Daily   enoxaparin (LOVENOX) injection  40 mg Subcutaneous [id number]   folic acid  1 mg Per Tube Daily   Gerhardt's butt cream   Topical TID   latanoprost  1 drop Both Eyes QHS   mouth rinse  15 mL Mouth Rinse q12n4p   megestrol  400 mg Per Tube Daily   mirtazapine  30 mg Per NG tube QHS   multivitamin  15 mL Per Tube Daily   nystatin  5 mL Oral 5 X Daily   pantoprazole sodium  40 mg Per Tube Daily   sodium chloride flush  3 mL Intravenous Q12H   Continuous Infusions:  sodium chloride     feeding supplement (OSMOLITE 1.2 CAL) 1,000 mL (01/16/19 2030)     LOS: 28 days   The patient is critically ill with  multiple organ systems failure and requires high complexity decision making for assessment and support, frequent evaluation and titration of therapies, application of advanced monitoring technologies and extensive interpretation of multiple databases. Critical Care Time devoted to patient care services described in this note  Time spent: 40 minutes     Michaell Grider, Geraldo Docker, MD [facility] Pager [phone]  If 7PM-7AM, please contact night-coverage [email or web address] Password [facility] 01/17/2019, 11:21 AM

## 2019-01-17 NOTE — Progress Notes (Signed)
VAST consulted to place IV. Pt without fluids infusing and one PRN IV med which has not been given since ordered. Spoke with pt's nurse, Ria Comment; she stated IV was needed because the physician said so when she called to inform that IV had come out.  Educated Ria Comment about increased risk for infection every time we stick the pt, as well as vein preservation for actual need. Educated it is in the pt's best interest not to place just in case IV's.

## 2019-01-18 DIAGNOSIS — R627 Adult failure to thrive: Secondary | ICD-10-CM | POA: Diagnosis not present

## 2019-01-18 LAB — COMPREHENSIVE METABOLIC PANEL
ALT: 157 U/L — ABNORMAL HIGH (ref 0–44)
AST: 247 U/L — ABNORMAL HIGH (ref 15–41)
Albumin: 1.8 g/dL — ABNORMAL LOW (ref 3.5–5.0)
Alkaline Phosphatase: 464 U/L — ABNORMAL HIGH (ref 38–126)
Anion gap: 6 (ref 5–15)
BUN: 26 mg/dL — ABNORMAL HIGH (ref 8–23)
CO2: 19 mmol/L — ABNORMAL LOW (ref 22–32)
Calcium: 7.7 mg/dL — ABNORMAL LOW (ref 8.9–10.3)
Chloride: 113 mmol/L — ABNORMAL HIGH (ref 98–111)
Creatinine, Ser: 0.71 mg/dL (ref 0.44–1.00)
GFR calc Af Amer: >60 mL/min (ref >60)
GFR calc non Af Amer: >60 mL/min (ref >60)
Glucose, Bld: 117 mg/dL — ABNORMAL HIGH (ref 70–99)
Potassium: 3.6 mmol/L (ref 3.5–5.1)
Sodium: 138 mmol/L (ref 135–145)
Total Bilirubin: 0.6 mg/dL (ref 0.3–1.2)
Total Protein: 5.6 g/dL — ABNORMAL LOW (ref 6.5–8.1)

## 2019-01-18 LAB — GLUCOSE, CAPILLARY: Glucose-Capillary: 107 mg/dL — ABNORMAL HIGH (ref 70–99)

## 2019-01-18 LAB — MAGNESIUM: Magnesium: 1.9 mg/dL (ref 1.7–2.4)

## 2019-01-18 LAB — PHOSPHORUS: Phosphorus: 2.9 mg/dL (ref 2.5–4.6)

## 2019-01-18 NOTE — Progress Notes (Signed)
Upon arrival to patient's room with night shift nurse Aliah for bedside shift report the patient was agitated, crying out and pulling her cortrack; Patient had a firm grip around tube, Aliah and I provided reassurance to patient  that she was safe and we were here to take care of her while trying to remove patient's hand from the tube. Patient instructed not to pull the tube. Patient released the tube and safety mittens were placed onto the patient's hands for safety. Assessed tube which was still clean, dry and intact. More reassurance provided to patient and she began to rest calmly. Will continue to monitor.

## 2019-01-18 NOTE — Progress Notes (Addendum)
Daily Rounding Note  01/18/2019, 8:22 AM  LOS: 29 days   SUBJECTIVE:   Chief complaint:    Elevated LFTs  Agitated and unsuccesful attempt to pull out core track FT overnight.  Mittens placed. Remains agitated currently   OBJECTIVE:         Vital signs in last 24 hours:    Temp:  [98.2 F (36.8 C)-98.9 F (37.2 C)] 98.9 F (37.2 C) (09/23 0524) Pulse Rate:  [97-108] 100 (09/23 0524) Resp:  [20] 20 (09/23 0524) BP: (125-153)/(77-87) 147/79 (09/23 0524) SpO2:  [99 %-100 %] 100 % (09/23 0524) Last BM Date: 01/17/19 Filed Weights   01/03/19 0500 01/04/19 0500 01/15/19 0500  Weight: 61 kg 61 kg 60 kg   General: alert, agitated   Heart: RRR Chest: clear in front.  No dyspnea but wet secretions, weak/ineffective cough Abdomen: soft, BS hypoactive.  NT  Extremities: thin Neuro/Psych:  Speaking but not able to make out what she is saying.  Not following commands.  No tremor.     Intake/Output from previous day: No intake/output data recorded.  Intake/Output this shift: No intake/output data recorded.  Lab Results: Recent Labs    01/16/19 0349 01/17/19 0502  WBC 6.8 7.1  HGB 9.5* 9.3*  HCT 27.0* 28.1*  PLT 109* 109*   BMET Recent Labs    01/16/19 0349 01/17/19 0502 01/18/19 0455  NA 135 138 138  K 4.0 4.1 3.6  CL 111 114* 113*  CO2 18* 18* 19*  GLUCOSE 106* 102* 117*  BUN 23 23 26*  CREATININE 0.73 0.65 0.71  CALCIUM 7.6* 7.6* 7.7*   LFT Recent Labs    01/16/19 0349 01/17/19 0502 01/18/19 0455  PROT 5.0* 5.5* 5.6*  ALBUMIN 1.7* 1.8* 1.8*  AST 206* 253* 247*  ALT 113* 143* 157*  ALKPHOS 506* 490* 464*  BILITOT 0.6 0.8 0.6   PT/INR No results for input(s): LABPROT, INR in the last 72 hours. Hepatitis Panel No results for input(s): HEPBSAG, HCVAB, HEPAIGM, HEPBIGM in the last 72 hours.  Studies/Results: Dg Abd 1 View  Result Date: 01/17/2019 CLINICAL DATA:  85 year old female with a  history of enteric tube placement EXAM: ABDOMEN - 1 VIEW; DG NASO G TUBE PLC W/FL-NO RAD COMPARISON:  January 13, 2019 FINDINGS: Limited plain film demonstrates post pyloric tube terminating in the 3/4 portion of the duodenum. IMPRESSION: Post pyloric feeding tube terminates in the 3/4 portion the duodenum. Electronically Signed   By: Corrie Mckusick D.O.   On: 01/17/2019 15:55   Dg Addison Bailey G Tube Plc W/fl-no Rad  Result Date: 01/17/2019 CLINICAL DATA:  85 year old female with a history of enteric tube placement EXAM: ABDOMEN - 1 VIEW; DG NASO G TUBE PLC W/FL-NO RAD COMPARISON:  January 13, 2019 FINDINGS: Limited plain film demonstrates post pyloric tube terminating in the 3/4 portion of the duodenum. IMPRESSION: Post pyloric feeding tube terminates in the 3/4 portion the duodenum. Electronically Signed   By: Corrie Mckusick D.O.   On: 01/17/2019 15:55    Scheduled Meds: . aspirin  81 mg Per Tube Daily  . chlorhexidine  15 mL Mouth Rinse BID  . collagenase   Topical Daily  . enoxaparin (LOVENOX) injection  40 mg Subcutaneous Q24H  . folic acid  1 mg Per Tube Daily  . Gerhardt's butt cream   Topical TID  . latanoprost  1 drop Both Eyes QHS  . mouth rinse  15 mL Mouth Rinse  [id number]  . megestrol  400 mg Per Tube Daily  . mirtazapine  30 mg Per NG tube QHS  . multivitamin  15 mL Per Tube Daily  . nystatin  5 mL Oral 5 X Daily  . pantoprazole sodium  40 mg Per Tube Daily  . sodium chloride flush  3 mL Intravenous Q12H   Continuous Infusions: . sodium chloride    . feeding supplement (OSMOLITE 1.2 CAL) 1,000 mL (01/16/19 2030)   PRN Meds:.sodium chloride, acetaminophen **OR** acetaminophen, magic mouthwash, ondansetron **OR** ondansetron (ZOFRAN) IV, sodium chloride flush   ASSESMENT:   *   Elevated LFTs.   UInclear cause, ? Initially due to sepsis, followed by DILI from  abx (keflex).    *   Severe PCM, pt not eating in setting of AMS/agitation.  Tolerating core track TFs.  Megace in place.     *   AMS, refusal to take PO, general decline/FTT.  Underlying depression suspected but dtr not in agreement, thus no anti-depressants/anti-psychotics in place.   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      Azucena Freed  01/18/2019, 8:22 AM Phone [phone]    Attending physician's note   I have taken an interval history, reviewed the chart and examined the patient. I agree with the Advanced Practitioner's note, impression and recommendations.   Previous notes/plans/discussion with the family - reviewed.  Elevated LFTs (?etiology, likely d/t DILI(likely keflex), doubt sepsis). AST/ALT trending down, but alk phos still trending up). Severe protein cal malnutrition. Tolerating core track tube feeds. Refusing PO Failure to thrive. Suspect underlying depression/dementia.  Plan: -Continue supportive care. -Trend LFTs every other day. -Due to advanced age and potential for complications, would hold off on liver bx for now. -Suggest palliative care consult. -If daughter is agreeable, consider IR consult for PEG in next few days.  D/w nursing staff.   Carmell Austria, MD [facility] [phone].

## 2019-01-18 NOTE — Care Management Important Message (Signed)
Important Message  Patient Details  Name: Sydney [name] MRN: [id number] Date of Birth: 1931-02-18   Medicare Important Message Given:  Yes     [name] 01/18/2019, 2:28 PM

## 2019-01-18 NOTE — Progress Notes (Signed)
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 CT of the abdomen and pelvis was negative for acute intra-abdominal or pelvic abnormality. She was found to have mildly elevated LFTs along with hyponatremia and pancytopenia. She was given IV fluids.ID and GI were consulted. Palliative care was subsequently consulted for goals of care discussion at admission.  Multidisciplinary meeting previously with medicine, administration, palliative care, GI, nursing staff, patient, patient's pastor, and patient advocate.  Lengthy discussion about patient's ongoing, albeit minimal, improvement with NG tube feeds status post antibiotics and supportive care.  Daughter remains some frustrated given no clear precipitating diagnosis which makes prognosis/timeline somewhat difficult to define.  Patient's daughter is requesting transfer to a different facility for possible further evaluation and work-up.  We discussed that given our extensive work-up here the only remaining evaluations left are MRCP, endoscopy and possibly liver biopsy, all of these would require likely anesthesia/sedation and risks would likely outweigh benefits at this point given patient's advanced age and weakened condition given poor p.o. intake. Given the patient previously refused disposition to SNF and pending possible transition to outside facility, our other option for disposition would ultimately be home with daughter to continue tube feeds and supportive care as she sees fit.  At this time patient has no obvious acute illness from which she suffers which has caused her mental status change.  Given her improvement  at this time we would continue to focus on supportive care and NG tube feeds, as further work-up can be facilitated at an outpatient procedure or other facility at this time.  Hopefully over the next 24 to 48 hours patient can be reevaluated by PT and speech therapy with further engagement and recommendations about advancement of diet if safe.  Patient's mental status continues to improve minimally, previously patient has only able to moan/calling out in pain -now able to answer questions appropriately, comments "I just want to go home". Patient does have ongoing NG tube with medications and food per daughter's wishes. Patient has had extensive work-up as below including imaging, labs all of which essentially have been generally unremarkable for overt cause of patient's mental and physical decline. Patient recently treated for questionable UTI given febrile events and mental status changes in hopes that this will improve her status although very minimal if any improvement, patient continues to have markedly poor p.o. intake and likely concurrent depression ongoing. Patient is an advanced age, appears severely malnourished at this point -not consistent with any acute dietary changes but appears to be more chronic, previously refusing all medications and management although daughter continues to request ongoing treatment, multiple consults as well as ongoing evaluation despite extensive negative work-up as below. Per discussion with palliative care as well as GI it appears the patient's daughter is unfortunately unable to cope with the fact that her mother is actively dying. We continue to follow along closely, offer what support we can, ethics has been consulted given inappropriate requests by daughter for consults and treatment which in my opinion would be futile (requesting at one point for obgyn to evaluate for possible exam to rule out vaginal cancer which we discussed wouldn't likely be tolerated - patient  then asked for blood work to rule  out GU cancers -which certainly would not have any meaningful impact on her current disposition).  Additionally patient continues to be somewhat agitated, unclear if patient is in pain or simply agitated due to her worsening mental status, but daughter is requesting we hold off on any further pain medication or anxiolytics.  We continue to recommend to focus on comfort measures and comfort care at this time, but daughter continues to request further workup  I am doubtful that any further imaging, lab work or procedures at this time will change our trajectory.  We unfortunately do not have a working diagnosis for patient's decline other than markedly poor p.o. intake in the setting of likely depression other than possible UTI given recent fever and patient's mental status changes, but have now been treated appropriately with minimal improvement in patient's status.  Patient's daughter also continues to be fixated on single dose of 0.57m lorazepam and 0.585mof morphine on night shift on 01/04/19 and 01/05/19 respectively that she believes has caused her mother's current nonverbal condition despite our explanation that this medication would have likely cleared within a few hours of administration.  At this time patient's outlook remains grim, patient remains a full code despite discussion with family as patient had previously noted " I want everything done" which at this time precludes any further discussion about code/palliative/hospice.  Patient's mental status remains remarkably poor, patient now completed antibiotic coverage for questionable UTI.  Recent repeat abdominal imaging remarkable for ascites only, not surprising given patient's very low albumin level again in the setting of poor p.o. intake.  Patient remains high risk for worsening mental status, aspiration, elevated risk of morbidity mortality given advanced age, chronic comorbid conditions and  above.   Subjective: Overnight patient continues to attempt to remove NG tube, remains oriented to person, otherwise mental status waxes and wanes.  This morning patient indicated "I want to go home" but otherwise denies any shortness of breath, chest pain, nausea, vomiting, headache, fever, chills.   Assessment & Plan:   Principal Problem:   Adult failure to thrive Active Problems:   Pancytopenia (HCC)   Hyponatremia   Protein calorie malnutrition (HCC)   History of TIA (transient ischemic attack)   Generalized weakness   Pressure injury of skin   Transaminitis   Malnutrition of moderate degree   Evaluation by psychiatric service required   MDD (major depressive disorder)  Failure to thrive in adult/severe protein calorie malnutrition/hypoalbuminemia* - CT head negative for acute abnormality see results below -MRI brain negative acute stroke see results below - unclear etiology but possible component of depression.  See depression below - PT/OT recommends SNF.  Daughter declined - Poor oral intake during hospitalization - 9/8 core track placed.  Tube feedings at goal - Per EMR and hospital staff patient's interaction with myself the best they have seen her since admission. -9/18 overnight patient had what sounded like residuals from her core track tube feedings held.  KUB ordered and pending.  Will restart feeds as soon as safe. - Hopefully patient's cognition will continue to improve however if does not at some point will need to consider PEG tube placement (next 7 to 14 days).  If patient to become a SNF candidate will require PEG tube. 9/19 continue tube feeds, RN staff/daughter to encourage patient to take p.o. nystatin in effort to clear up her candidiasis. -9/22 bedside swallow more successful.  Dysphagia 3 diet.  Unfortunately patient so weak will not be able to meet her calorie  intake just through p.o feeds. - 9/22 patient accidentally removed core track tube.  IR to  reinstall tube today.  Restart tube feeds once reinstalled.  Also encourage patient p.o. intake..   -Spoke with [name] daughter concerning placement of PEG tube, discussed with GI possible need for PEG tube placement by the end of the week with likely ultimate disposition home.  Hx TIA - Has not received Plavix since 12/24/2018 patient refusing. -Continue aspirin  Acute metabolic encephalopathy, improving minimally -Multifactorial failure to thrive, infection, shock liver, metabolic derangement -1/54 appears to be improving slightly patient shakes her head yes and no to questions follows commands though refuses/cannot verbalize - Minimize sedating medication -9/22 continues to wax and wane but appears clear every day.  Shock liver/elevated LFTs - GI following - Unclear etiology - AST/ALT/alk phos remain elevated - Hepatitis panel negative, EBV and CMV IgM negative - CT abdomen and pelvis negative evidence of acute intra-abdominal/pelvic abnormality.  Diffuse fluid within the colon suggestive of enteritis vs diarrheal illness (negative abdominal tenderness, or other GI symptoms) - Alkaline phosphatase, AST, ALT rising.  Most likely not secondary to antibiotics as they were discontinued 9/13.  Will continue to monitor along with GI - RUQ ultrasound showed gallbladder sludge see below - KUB on 12/29/2018 with mild diffuse gaseous prominence without obstructive pattern.  - Suspicion for possible mild shock liver given she was hypotensive on presentation.   - GI also believes that this transaminitis could reflect from skeletal muscle release as well - CK, MB minimally elevated -but improving - GI following, repeat CT on 01/06/2019 remarkable for ascites -9/18 discussed case with Dr. Justice [facility] GI who spoken to daughter and plan is for liver Doppler ultrasound.  If abnormalities noted on ultrasound will most likely proceed with MRI/MRCP and possibly a liver biopsy. -9/22 Family not wanting an  aggressive procedure performed (patient would not do well).   Major depressive disorder - Recent loss of sister 2 months ago with extreme ataxia and failure to thrive, daughter continues to think that this therapy is completely wrong. -Psychiatry consulted recommended initiation of antidepressant therapy.  Patient nor daughter interested in initiating treatment -although patient remains on Remeron  UTI/yeast infection in groin and oral thrush, resolving - Now completed antibiotic course - nystatin topical powder to groin - Nystatin swish and swallow 18m  5 x per day or Clotrimazole 5 times daily - ID following patient - Speech continues to follow along, patient tolerating p.o. quite poorly  Leukopenia - Resolved  Thrombocytopenia -Most likely secondary to patient's liver dysfunction.  Currently no signs or symptoms of occult bleed - Started on vitamin B12 parenterally as she has had low levels of vitamin B12 during last admission -No further work-up indicated, patient can follow-up in outpatient setting with indicated -9/21 continues to improve  AKI -Resolved  Hyponatremia -Resolved  Hypokalemia -Resolved  Refeeding syndrome, resolving - Now that patient receiving nutrition continues to have metabolic derangements - Resolving: Mg/Phos WNL  Sacral cubitus ulcer stage II - Continue care per wound care recommendations  Loose stools, in the setting of tube feeds - Afebrile , negative leukocytosis, negative abdominal pain.  No indication for C. difficile work-up -NOTE; previous history of C. difficile colitis back in 2018 in which she received a fecal transplant - Patient was covered by vancomycin per ID guidelines being treated for UTI as above - Resolving   Ethics: - Patient now re-presenting after recent hospitalization, was home for roughly 1-2 days and returned with  worsening weakness, severe dehydration, and adult failure to thrive.  Daughter believes that she just  will require some nutrition through artificial means to get her back to her previous baseline. She states that her mother was independent with her ADLs and cooking for herself prior to admission (unclear timeframe).  Daughter states that her current decline was acute and only started on 12/28/2018; which is contrary to all nursing reports and previous provider reports during her care during this hospitalization (patient weighed 45kg over a month ago -admitted at 48 kg).  Daughter seems to be struggling with the notion that her mother is in the active dying process in regards to her adult failure to thrive in which she is refusing most if not all interventions and has not been eating or drinking now for weeks. - Given this patient's advanced age, repeat hospitalization in less than a few days from previous discharge, patient's prognosis is grim, and this has been relayed to the patient's daughter on multiple occasions by multiple providers. - Ethics has been consulted, following along, we appreciate insight recommendations on this difficult case  DVT prophylaxis: Lovenox/SCD Code Status: Full Family Communication: 9/23 spoke with daughter at length and discussed plan of care answered all questions Disposition Plan: TBD   Consultants:  ID GI Hematology Palliative care IR  Antimicrobials: Anti-infectives (From admission, onward)   Start     Stop   01/06/19 2200  cephALEXin (KEFLEX) 250 MG/5ML suspension 500 mg     01/08/19 2141   01/06/19 1045  ceFAZolin (ANCEF) IVPB 1 g/50 mL premix  Status:  Discontinued     01/06/19 1734   01/06/19 1000  vancomycin (VANCOCIN) 50 mg/mL oral solution 125 mg     01/08/19 1226   01/05/19 1200  cefTRIAXone (ROCEPHIN) 1 g in sodium chloride 0.9 % 100 mL IVPB  Status:  Discontinued     01/06/19 1037      LINES / TUBES:  Core track placed 9/8>>> accidentally removed 01/17/2019 Core track replaced 9/22>> ongoing  Continuous Infusions:  sodium chloride      feeding supplement (OSMOLITE 1.2 CAL) 1,000 mL (01/16/19 2030)   Objective: Vitals:   01/17/19 0648 01/17/19 1700 01/17/19 2217 01/18/19 0524  BP: (!) 142/98 125/77 (!) 153/87 (!) 147/79  Pulse: (!) 110 97 (!) 108 100  Resp: 20   20  Temp: 98.6 F (37 C) 98.5 F (36.9 C) 98.2 F (36.8 C) 98.9 F (37.2 C)  TempSrc: Oral Oral Oral Oral  SpO2: 94% 99%  100%  Weight:      Height:        Intake/Output Summary (Last 24 hours) at 01/18/2019 1249 Last data filed at 01/18/2019 0800 Gross per 24 hour  Intake 0 ml  Output --  Net 0 ml   Filed Weights   01/03/19 0500 01/04/19 0500 01/15/19 0500  Weight: 61 kg 61 kg 60 kg    Physical Exam:  General: Alert, follows commands, no acute distress  HEENT: Pupils equal round reactive to light without scleral icterus or injection, negative for rhinorrhea, mucous membranes appear dry Neck:  Negative scars, masses, torticollis, lymphadenopathy, JVD Lungs: Clear to auscultation bilaterally without wheezes or crackles Cardiovascular: Regular rate and rhythm without murmur gallop or rub normal S1 and S2 Abdomen: negative abdominal pain, nondistended, positive soft, bowel sounds, no rebound, no ascites, no appreciable mass Extremities: No significant cyanosis, clubbing, or edema bilateral lower extremities Skin: Stage II sacral decubitus ulcer  Data Reviewed: Care during the described  time interval was provided by me .  I have reviewed this patient's available data, including medical history, events of note, physical examination, and all test results as part of my evaluation.   CBC: Recent Labs  Lab 01/13/19 0256 01/14/19 0316 01/15/19 0444 01/16/19 0349 01/17/19 0502  WBC 7.5 7.1 6.4 6.8 7.1  NEUTROABS 6.0 5.8 5.5 5.9 6.2  HGB 10.8* 10.1* 9.6* 9.5* 9.3*  HCT 30.9* 29.9* 26.6* 27.0* 28.1*  MCV 88.5 92.0 89.9 90.9 95.9  PLT 94* 96* 101* 109* 400*   Basic Metabolic Panel: Recent Labs  Lab 01/14/19 0316 01/14/19 1506 01/15/19 0444  01/16/19 0349 01/17/19 0502 01/18/19 0455  NA 134* 136  --  135 138 138  K 4.7 4.2  --  4.0 4.1 3.6  CL 110 113*  --  111 114* 113*  CO2 17* 19*  --  18* 18* 19*  GLUCOSE 87 103*  --  106* 102* 117*  BUN 26* 24*  --  23 23 26*  CREATININE 0.86 0.76  --  0.73 0.65 0.71  CALCIUM 8.4* 8.1*  --  7.6* 7.6* 7.7*  MG 2.2 1.9 2.3 2.0 1.9 1.9  PHOS 1.9* 1.6* 3.8 3.6 2.7 2.9   GFR: Estimated Creatinine Clearance: 41.8 mL/min (by C-G formula based on SCr of 0.71 mg/dL). Liver Function Tests: Recent Labs  Lab 01/13/19 0256 01/14/19 0316 01/16/19 0349 01/17/19 0502 01/18/19 0455  AST 183* 177* 206* 253* 247*  ALT 73* 75* 113* 143* 157*  ALKPHOS 751* 643* 506* 490* 464*  BILITOT 1.0 1.0 0.6 0.8 0.6  PROT 5.7* 5.6* 5.0* 5.5* 5.6*  ALBUMIN 1.9* 1.8* 1.7* 1.8* 1.8*   No results for input(s): LIPASE, AMYLASE in the last 168 hours. Recent Labs  Lab 01/12/19 1939  AMMONIA 22   Coagulation Profile: Recent Labs  Lab 01/15/19 0444  INR 1.0   Cardiac Enzymes: No results for input(s): CKTOTAL, CKMB, CKMBINDEX, TROPONINI in the last 168 hours. BNP (last 3 results) No results for input(s): PROBNP in the last 8760 hours. HbA1C: No results for input(s): HGBA1C in the last 72 hours. CBG: Recent Labs  Lab 01/12/19 0751 01/13/19 0832 01/17/19 0810 01/18/19 0736  GLUCAP 106* 80 94 107*   Lipid Profile: No results for input(s): CHOL, HDL, LDLCALC, TRIG, CHOLHDL, LDLDIRECT in the last 72 hours. Thyroid Function Tests: No results for input(s): TSH, T4TOTAL, FREET4, T3FREE, THYROIDAB in the last 72 hours. Anemia Panel: No results for input(s): VITAMINB12, FOLATE, FERRITIN, TIBC, IRON, RETICCTPCT in the last 72 hours. Urine analysis:    Component Value Date/Time   COLORURINE YELLOW 01/08/2019 2015   APPEARANCEUR HAZY (A) 01/08/2019 2015   LABSPEC 1.018 01/08/2019 2015   PHURINE 6.0 01/08/2019 2015   GLUCOSEU NEGATIVE 01/08/2019 2015   HGBUR SMALL (A) 01/08/2019 2015   BILIRUBINUR  NEGATIVE 01/08/2019 2015   KETONESUR NEGATIVE 01/08/2019 2015   PROTEINUR NEGATIVE 01/08/2019 2015   UROBILINOGEN 0.2 07-01-202014 1048   NITRITE NEGATIVE 01/08/2019 2015   LEUKOCYTESUR NEGATIVE 01/08/2019 2015   No results found for this or any previous visit (from the past 240 hour(s)).   Radiology Studies: Dg Abd 1 View  Result Date: 01/17/2019 CLINICAL DATA:  85 year old female with a history of enteric tube placement EXAM: ABDOMEN - 1 VIEW; DG NASO G TUBE PLC W/FL-NO RAD COMPARISON:  January 13, 2019 FINDINGS: Limited plain film demonstrates post pyloric tube terminating in the 3/4 portion of the duodenum. IMPRESSION: Post pyloric feeding tube terminates in the 3/4 portion  the duodenum. Electronically Signed   By: Corrie Mckusick D.O.   On: 01/17/2019 15:55   Dg Addison Bailey G Tube Plc W/fl-no Rad  Result Date: 01/17/2019 CLINICAL DATA:  85 year old female with a history of enteric tube placement EXAM: ABDOMEN - 1 VIEW; DG NASO G TUBE PLC W/FL-NO RAD COMPARISON:  January 13, 2019 FINDINGS: Limited plain film demonstrates post pyloric tube terminating in the 3/4 portion of the duodenum. IMPRESSION: Post pyloric feeding tube terminates in the 3/4 portion the duodenum. Electronically Signed   By: Corrie Mckusick D.O.   On: 01/17/2019 15:55   Scheduled Meds:  aspirin  81 mg Per Tube Daily   chlorhexidine  15 mL Mouth Rinse BID   collagenase   Topical Daily   enoxaparin (LOVENOX) injection  40 mg Subcutaneous T41D   folic acid  1 mg Per Tube Daily   Gerhardt's butt cream   Topical TID   latanoprost  1 drop Both Eyes QHS   mouth rinse  15 mL Mouth Rinse q12n4p   megestrol  400 mg Per Tube Daily   mirtazapine  30 mg Per NG tube QHS   multivitamin  15 mL Per Tube Daily   nystatin  5 mL Oral 5 X Daily   pantoprazole sodium  40 mg Per Tube Daily   sodium chloride flush  3 mL Intravenous Q12H   Continuous Infusions:  sodium chloride     feeding supplement (OSMOLITE 1.2 CAL) 1,000  mL (01/16/19 2030)     LOS: 29 days   The patient is critically ill with multiple organ systems failure and requires high complexity decision making for assessment and support, frequent evaluation and titration of therapies, application of advanced monitoring technologies and extensive interpretation of multiple databases. Critical Care Time devoted to patient care services described in this note  Time spent: 33 minutes   Little Ishikawa, DO [facility] Pager [phone]  If 7PM-7AM, please contact night-coverage [email or web address] Password [facility] 01/18/2019, 12:49 PM

## 2019-01-19 DIAGNOSIS — R627 Adult failure to thrive: Secondary | ICD-10-CM | POA: Diagnosis not present

## 2019-01-19 LAB — GLUCOSE, CAPILLARY: Glucose-Capillary: 135 mg/dL — ABNORMAL HIGH (ref 70–99)

## 2019-01-19 MED ORDER — NYSTATIN 100000 UNIT/GM EX POWD
Freq: Two times a day (BID) | CUTANEOUS | Status: DC
  Administered 2019-01-19 – 2019-01-24 (×11): via TOPICAL
  Administered 2019-01-25: 10:00:00 1 via TOPICAL
  Administered 2019-01-25 – 2019-01-31 (×11): via TOPICAL
  Filled 2019-01-19: qty 15

## 2019-01-19 NOTE — Progress Notes (Signed)
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 CT of the abdomen and pelvis was negative for acute intra-abdominal or pelvic abnormality. She was found to have mildly elevated LFTs along with hyponatremia and pancytopenia. She was given IV fluids.ID and GI were consulted. Palliative care was subsequently consulted for goals of care discussion at admission.  Multidisciplinary meeting previously with medicine, administration, palliative care, GI, nursing staff, patient, patient's pastor, and patient advocate.  Lengthy discussion about patient's ongoing, albeit minimal, improvement with NG tube feeds status post antibiotics and supportive care.  Daughter remains some frustrated given no clear precipitating diagnosis which makes prognosis/timeline somewhat difficult to define.  Patient's daughter is requesting transfer to a different facility for possible further evaluation and work-up.  We discussed that given our extensive work-up here the only remaining evaluations left are MRCP, endoscopy and possibly liver biopsy, all of these would require likely anesthesia/sedation and risks would likely outweigh benefits at this point given patient's advanced age and weakened condition given poor p.o. intake. Given the patient previously refused disposition to SNF and pending possible transition to outside facility, our other option for disposition would ultimately be home with daughter to continue tube feeds and supportive care as she sees fit.  At this time patient has no obvious acute illness from which she suffers which has caused her mental status change.  Given her improvement  at this time we would continue to focus on supportive care and NG tube feeds, as further work-up can be facilitated at an outpatient procedure or other facility at this time.  Hopefully over the next 24 to 48 hours patient can be reevaluated by PT and speech therapy with further engagement and recommendations about advancement of diet if safe.  Patient's mental status continues to improve minimally, previously patient has only able to moan/calling out in pain -now able to answer questions appropriately, comments "I just want to go home". Patient does have ongoing NG tube with medications and food per daughter's wishes. Patient has had extensive work-up as below including imaging, labs all of which essentially have been generally unremarkable for overt cause of patient's mental and physical decline. Patient recently treated for questionable UTI given febrile events and mental status changes in hopes that this will improve her status although very minimal if any improvement, patient continues to have markedly poor p.o. intake and likely concurrent depression ongoing. Patient is an advanced age, appears severely malnourished at this point -not consistent with any acute dietary changes but appears to be more chronic, previously refusing all medications and management although daughter continues to request ongoing treatment, multiple consults as well as ongoing evaluation despite extensive negative work-up as below. Per discussion with palliative care as well as GI it appears the patient's daughter is unfortunately unable to cope with the fact that her mother is actively dying. We continue to follow along closely, offer what support we can, ethics has been consulted given inappropriate requests by daughter for consults and treatment which in my opinion would be futile (requesting at one point for obgyn to evaluate for possible exam to rule out vaginal cancer which we discussed wouldn't likely be tolerated - patient  then asked for blood work to rule  GU cancers -which certainly would not have any meaningful impact on her current disposition).  Additionally patient continues to be somewhat agitated, unclear if patient is in pain or simply agitated due to her worsening mental status, but daughter is requesting we hold off on any further pain medication or anxiolytics.  We continue to recommend to focus on comfort measures and comfort care at this time, but daughter continues to request further workup  I am doubtful that any further imaging, lab work or procedures at this time will change our trajectory.  We unfortunately do not have a working diagnosis for patient's decline other than markedly poor p.o. intake in the setting of likely depression other than possible UTI given recent fever and patient's mental status changes, but have now been treated appropriately with minimal improvement in patient's status.  Patient's daughter also continues to be fixated on single dose of 0.'25mg'$  lorazepam and 0.'5mg'$  of morphine on night shift on 01/04/19 and 01/05/19 respectively that she believes has caused her mother's current nonverbal condition despite our explanation that this medication would have likely cleared within a few hours of administration.  At this time patient's outlook remains grim, patient remains a full code despite discussion with family as patient had previously noted " I want everything done" which at this time precludes any further discussion about code/palliative/hospice.  Patient's mental status remains remarkably poor, patient now completed antibiotic coverage for questionable UTI.  Recent repeat abdominal imaging remarkable for ascites only, not surprising given patient's very low albumin level again in the setting of poor p.o. intake.  Patient remains high risk for worsening mental status, aspiration, elevated risk of morbidity mortality given advanced age, chronic comorbid conditions and  above.   Subjective: Patient was seen and examined this morning, confused agitated, moaning, unable to engage in conversation. Per nursing staff was agitated overnight continues to be disoriented Per nursing staff mentation waxes and wanes States at times that she would like to go home.  No reported visible shortness of breath, pain, nausea or vomiting.  No recorded fever or chills in last 12 hours.   Assessment & Plan:   Principal Problem:   Adult failure to thrive Active Problems:   Pancytopenia (HCC)   Hyponatremia   Protein calorie malnutrition (HCC)   History of TIA (transient ischemic attack)   Generalized weakness   Pressure injury of skin   Transaminitis   Malnutrition of moderate degree   Evaluation by psychiatric service required   MDD (major depressive disorder)  Failure to thrive in adult/severe protein calorie malnutrition/hypoalbuminemia* - CT head negative for acute abnormality see results below -MRI brain negative acute stroke see results below - unclear etiology but possible component of depression.  See depression below - PT/OT recommends SNF.  Daughter declined - Poor oral intake during hospitalization - 9/8 core track placed.  Tube feedings at goal - Per EMR and hospital staff patient's interaction with myself the best they have seen her since admission. -9/18 overnight patient had what sounded like residuals from her core track tube feedings held.  KUB ordered and pending.  Will restart feeds as soon as safe. - Hopefully patient's cognition will continue to improve however if does not at some point will need to consider PEG tube placement (next 7 to 14 days).  -  If patient to become a SNF candidate will require PEG tube. 9/19 continue tube feeds, RN staff/daughter to encourage patient to take p.o. nystatin in effort to clear up her candidiasis. -9/22  bedside swallow more successful.  Dysphagia 3 diet.  Unfortunately patient so weak will not be able to meet  her calorie intake just through p.o feeds. - 9/22 patient accidentally removed core track tube.  IR to reinstall tube today.  Restart tube feeds once reinstalled.  Also encourage patient p.o. intake..    -Spoke with [name] daughter concerning placement of PEG tube, discussed with GI possible need for PEG tube placement by the end of the week with likely ultimate disposition home .... It looks like GI might have signed off, did not recommending PEG tube  -01/19/2019  - interventional radiologist Dr. Pascal Lux was consulted for possible PEG tube placement today 01/19/2019 --- he has refused the case in detail, including reviewing the images.  Due to patient's comorbidities and poor imaging ascites patient is determined not to be a candidate for PEG tube placement.  Hx TIA - Has not received Plavix since 12/24/2018 patient refusing. -Continue aspirin  Acute metabolic encephalopathy, improving minimally -Multifactorial failure to thrive, infection, shock liver, metabolic derangement -5/70 appears to be improving slightly patient shakes her head yes and no to questions follows commands though refuses/cannot verbalize - Minimize sedating medication -9/22 continues to wax and wane but appears clear every day.  Shock liver/elevated LFTs -Monitoring LFTs - Open GI to continue to follow, appreciate their input  High atraumatic Gonczy- Unclear etiology - AST/ALT/alk phos remain elevated - Hepatitis panel negative, EBV and CMV IgM negative - CT abdomen and pelvis negative evidence of acute intra-abdominal/pelvic abnormality.  Diffuse fluid within the colon suggestive of enteritis vs diarrheal illness (negative abdominal tenderness, or other GI symptoms) - Alkaline phosphatase, AST, ALT rising.  Most likely not secondary to antibiotics as they were discontinued 9/13.  Will continue to monitor along with GI - RUQ ultrasound showed gallbladder sludge see below - KUB on 12/29/2018 with mild diffuse gaseous  prominence without obstructive pattern.  - Suspicion for possible mild shock liver given she was hypotensive on presentation.   - GI also believes that this transaminitis could reflect from skeletal muscle release as well - CK, MB minimally elevated -but improving - GI following, repeat CT on 01/06/2019 remarkable for ascites -9/18 discussed case with Dr. Justice Britain GI who spoken to daughter and plan is for liver Doppler ultrasound.  If abnormalities noted on ultrasound will most likely proceed with MRI/MRCP and possibly a liver biopsy. -9/22 Family not wanting an aggressive procedure performed (patient would not do well).   Major depressive disorder - Recent loss of sister 2 months ago with extreme ataxia and failure to thrive, daughter continues to think that this therapy is completely wrong. -Psychiatry consulted recommended initiation of antidepressant therapy.  Patient nor daughter interested in initiating treatment -although patient remains on Remeron  UTI/yeast infection in groin and oral thrush, resolving - Now completed antibiotic course - nystatin topical powder to groin - Nystatin swish and swallow 50m  5 x per day or Clotrimazole 5 times daily - ID following patient - Speech continues to follow along, patient tolerating p.o. quite poorly  Leukopenia - Resolved  Thrombocytopenia -Most likely secondary to patient's liver dysfunction.  Currently no signs or symptoms of occult bleed - Started on vitamin B12 parenterally as she has had low levels of vitamin B12 during last admission -No further work-up indicated, patient can follow-up in outpatient setting with indicated -9/21 continues to improve  AKI/hyponatremia/hypokalemia -Resolved -Monitoring  Refeeding syndrome, resolving - Now that patient receiving nutrition continues to have metabolic derangements -  Resolving: Mg/Phos WNL  Sacral cubitus ulcer stage II - Continue care per wound care recommendations  Loose  stools, in the setting of tube feeds - Afebrile , negative leukocytosis, negative abdominal pain.  No indication for C. difficile work-up -NOTE; previous history of C. difficile colitis back in 2018 in which she received a fecal transplant - Patient was covered by vancomycin per ID guidelines being treated for UTI as above - Resolving   Ethics: - Patient now re-presenting after recent hospitalization, was home for roughly 1-2 days and returned with worsening weakness, severe dehydration, and adult failure to thrive.  Daughter believes that she just will require some nutrition through artificial means to get her back to her previous baseline. She states that her mother was independent with her ADLs and cooking for herself prior to admission (unclear timeframe).  Daughter states that her current decline was acute and only started on 12/28/2018; which is contrary to all nursing reports and previous provider reports during her care during this hospitalization (patient weighed 45kg over a month ago -admitted at 48 kg).  Daughter seems to be struggling with the notion that her mother is in the active dying process in regards to her adult failure to thrive in which she is refusing most if not all interventions and has not been eating or drinking now for weeks. - Given this patient's advanced age, repeat hospitalization in less than a few days from previous discharge, patient's prognosis is grim, and this has been relayed to the patient's daughter on multiple occasions by multiple providers. - Ethics has been consulted, following along, we appreciate insight recommendations on this difficult case  DVT prophylaxis: Lovenox/SCD Code Status: Full Family Communication: 01/18/19 Dr. Avon Gully has updated patient's daughter apparently continues to request full care to be continued The phone number provided was called no answer yet.  Disposition Plan: TBD   Consultants:  ID GI Hematology Palliative  care IR  Antimicrobials: Anti-infectives (From admission, onward)   Start     Stop   01/06/19 2200  cephALEXin (KEFLEX) 250 MG/5ML suspension 500 mg     01/08/19 2141   01/06/19 1045  ceFAZolin (ANCEF) IVPB 1 g/50 mL premix  Status:  Discontinued     01/06/19 1734   01/06/19 1000  vancomycin (VANCOCIN) 50 mg/mL oral solution 125 mg     01/08/19 1226   01/05/19 1200  cefTRIAXone (ROCEPHIN) 1 g in sodium chloride 0.9 % 100 mL IVPB  Status:  Discontinued     01/06/19 1037      LINES / TUBES:  Core track placed 9/8>>> accidentally removed 01/17/2019 Core track replaced 9/22>> ongoing  Continuous Infusions:  sodium chloride     feeding supplement (OSMOLITE 1.2 CAL) 1,000 mL (01/16/19 2030)   Objective: Vitals:   01/18/19 0524 01/18/19 1429 01/18/19 2107 01/19/19 0639  BP: (!) 147/79 129/77 (!) 145/90 (!) 159/94  Pulse: 100 92 100 (!) 103  Resp: '20 20 18   '$ Temp: 98.9 F (37.2 C) 99 F (37.2 C) 99 F (37.2 C) 98.1 F (36.7 C)  TempSrc: Oral Oral Oral Oral  SpO2: 100% 92% 100% 99%  Weight:      Height:       No intake or output data in the 24 hours ending 01/19/19 1305 Filed Weights   01/03/19 0500 01/04/19 0500 01/15/19 0500  Weight: 61 kg 61 kg 60 kg    Physical Exam: BP (!) 159/94 (BP Location: Left Arm)    Pulse (!) 103  Temp 98.1 F (36.7 C) (Oral)    Resp 18    Ht '5\' 3"'$  (1.6 m)    Wt 60 kg    SpO2 99%    BMI 23.43 kg/m    Physical Exam  Constitution: Cachectic, confused, NG tube in place Psychiatric: Unable to engage in meaningful conversation, confused morning,,   HEENT: Normocephalic, PERRL, Chest:Chest symmetric Cardio vascular:   Tachycardic, S1/S2, RRR, No murmure, No Rubs or Gallops  pulmonary: CTA bilaterally, respirations unlabored, no wheezes / crackles Abdomen: Soft, non-tender, non-distended, bowel sounds,no masses, no organomegaly Muscular skeletal: Limited exam - in bed, able to move all 4 extremities,  Neuro: Limited exam CNII-XII seems to  be intact moving all 4 extremities in bed, withdraws to pain stimuli.   Extremities: No pitting edema lower extremities, +2 pulses  Skin: Dry, warm to touch, negative for any Rashes, No open wounds Wounds: per nursing documentation  Data Reviewed  CBC: Recent Labs  Lab 01/13/19 0256 01/14/19 0316 01/15/19 0444 01/16/19 0349 01/17/19 0502  WBC 7.5 7.1 6.4 6.8 7.1  NEUTROABS 6.0 5.8 5.5 5.9 6.2  HGB 10.8* 10.1* 9.6* 9.5* 9.3*  HCT 30.9* 29.9* 26.6* 27.0* 28.1*  MCV 88.5 92.0 89.9 90.9 95.9  PLT 94* 96* 101* 109* 681*   Basic Metabolic Panel: Recent Labs  Lab 01/14/19 0316 01/14/19 1506 01/15/19 0444 01/16/19 0349 01/17/19 0502 01/18/19 0455  NA 134* 136  --  135 138 138  K 4.7 4.2  --  4.0 4.1 3.6  CL 110 113*  --  111 114* 113*  CO2 17* 19*  --  18* 18* 19*  GLUCOSE 87 103*  --  106* 102* 117*  BUN 26* 24*  --  23 23 26*  CREATININE 0.86 0.76  --  0.73 0.65 0.71  CALCIUM 8.4* 8.1*  --  7.6* 7.6* 7.7*  MG 2.2 1.9 2.3 2.0 1.9 1.9  PHOS 1.9* 1.6* 3.8 3.6 2.7 2.9   GFR: Estimated Creatinine Clearance: 41.8 mL/min (by C-G formula based on SCr of 0.71 mg/dL). Liver Function Tests: Recent Labs  Lab 01/13/19 0256 01/14/19 0316 01/16/19 0349 01/17/19 0502 01/18/19 0455  AST 183* 177* 206* 253* 247*  ALT 73* 75* 113* 143* 157*  ALKPHOS 751* 643* 506* 490* 464*  BILITOT 1.0 1.0 0.6 0.8 0.6  PROT 5.7* 5.6* 5.0* 5.5* 5.6*  ALBUMIN 1.9* 1.8* 1.7* 1.8* 1.8*   No results for input(s): LIPASE, AMYLASE in the last 168 hours. Recent Labs  Lab 01/12/19 1939  AMMONIA 22   Coagulation Profile: Recent Labs  Lab 01/15/19 0444  INR 1.0   Cardiac Enzymes: No results for input(s): CKTOTAL, CKMB, CKMBINDEX, TROPONINI in the last 168 hours. BNP (last 3 results) No results for input(s): PROBNP in the last 8760 hours. HbA1C: No results for input(s): HGBA1C in the last 72 hours. CBG: Recent Labs  Lab 01/13/19 0832 01/17/19 0810 01/18/19 0736 01/19/19 0633  GLUCAP 80  94 107* 135*  Urine analysis:    Component Value Date/Time   COLORURINE YELLOW 01/08/2019 2015   APPEARANCEUR HAZY (A) 01/08/2019 2015   LABSPEC 1.018 01/08/2019 2015   PHURINE 6.0 01/08/2019 2015   GLUCOSEU NEGATIVE 01/08/2019 2015   HGBUR SMALL (A) 01/08/2019 2015   BILIRUBINUR NEGATIVE 01/08/2019 2015   KETONESUR NEGATIVE 01/08/2019 2015   PROTEINUR NEGATIVE 01/08/2019 2015   UROBILINOGEN 0.2 September 29, 202014 1048   NITRITE NEGATIVE 01/08/2019 2015   LEUKOCYTESUR NEGATIVE 01/08/2019 2015   No results found for this or any  previous visit (from the past 240 hour(s)).   Radiology Studies: Dg Abd 1 View  Result Date: 01/17/2019 CLINICAL DATA:  85 year old female with a history of enteric tube placement EXAM: ABDOMEN - 1 VIEW; DG NASO G TUBE PLC W/FL-NO RAD COMPARISON:  January 13, 2019 FINDINGS: Limited plain film demonstrates post pyloric tube terminating in the 3/4 portion of the duodenum. IMPRESSION: Post pyloric feeding tube terminates in the 3/4 portion the duodenum. Electronically Signed   By: Corrie Mckusick D.O.   On: 01/17/2019 15:55   Dg Addison Bailey G Tube Plc W/fl-no Rad  Result Date: 01/17/2019 CLINICAL DATA:  85 year old female with a history of enteric tube placement EXAM: ABDOMEN - 1 VIEW; DG NASO G TUBE PLC W/FL-NO RAD COMPARISON:  January 13, 2019 FINDINGS: Limited plain film demonstrates post pyloric tube terminating in the 3/4 portion of the duodenum. IMPRESSION: Post pyloric feeding tube terminates in the 3/4 portion the duodenum. Electronically Signed   By: Corrie Mckusick D.O.   On: 01/17/2019 15:55   Scheduled Meds:  aspirin  81 mg Per Tube Daily   chlorhexidine  15 mL Mouth Rinse BID   collagenase   Topical Daily   enoxaparin (LOVENOX) injection  40 mg Subcutaneous O32N   folic acid  1 mg Per Tube Daily   Gerhardt's butt cream   Topical TID   latanoprost  1 drop Both Eyes QHS   mouth rinse  15 mL Mouth Rinse q12n4p   megestrol  400 mg Per Tube Daily    mirtazapine  30 mg Per NG tube QHS   multivitamin  15 mL Per Tube Daily   nystatin  5 mL Oral 5 X Daily   nystatin   Topical BID   pantoprazole sodium  40 mg Per Tube Daily   sodium chloride flush  3 mL Intravenous Q12H   Continuous Infusions:  sodium chloride     feeding supplement (OSMOLITE 1.2 CAL) 1,000 mL (01/16/19 2030)     LOS: 30 days   The patient is critically ill with multiple organ systems failure and requires high complexity decision making for assessment and support, frequent evaluation and titration of therapies, application of advanced monitoring technologies and extensive interpretation of multiple databases. Critical Care Time devoted to patient care services described in this note  Time spent: 40 minutes   Deatra James, MD., [facility].  [facility] Pager [phone]  If 7PM-7AM, please contact night-coverage [email or web address] Password [email or web address] 01/19/2019, 1:05 PM

## 2019-01-19 NOTE — Progress Notes (Signed)
Pt has been very agitated the entire night, had given once prn tylenol around 2153 for pain at buttom and been repositioning pt for comfort.

## 2019-01-19 NOTE — Progress Notes (Signed)
I have reviewed hospital internist's note from today and labs.  LFTs remain similar, with mild improvement in Alk phos.  I am in agreement that invasive procedures such as liver biopsy or PEG are unlikely to give a treatable diagnosis or change the trajectory of this patient's condition.  Her prognosis appears grim, and another ethics evaluation is warranted.  As we cannot be of any more help to this patient and her family, we will sign off.

## 2019-01-19 NOTE — Progress Notes (Signed)
Patient daughter made request to "get patient up". [name], NT and I helped patient to the side of the bed; Patient was unable to support herself even with total  assistance from [facility] and I. Patient began calling out excessively. I informed [name] this was not safe for the patient or staff and proceeded to get the patient back to bed safely. Daughter stated "staff were able to do it the other day, they even got her up in the chair". I informed the daughter she has been quite agitated today and appears to be experiencing weakness at this time. Daughter verbalized understanding. Will continue to monitor.

## 2019-01-19 NOTE — Evaluation (Signed)
Physical Therapy Evaluation Patient Details Name: Sydney [name] MRN: [id number] DOB: 1930/06/12 Today's Date: 01/19/2019   History of Present Illness  Sydney [name] is a 85 y.o. 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  Clinical Impression  Pt demonstrates deficits in functional mobility, strength, power, ROM, gait, endurance, cognition, safety awareness. Pt a willing participant in edge of bed activity when provided time. Pt follows commands with verbal cueing and encouragement and does well with Daughter's support in room. Future session may be best with daughter present as she is present most days from 1030AM until visitor hours end. Pt will benefit from acute PT services to reduce assistance requirements for bed mobility, improve sitting tolerance, and initiate transfer training (either functional or with hoyer lift). PT recommendations: Per chart review family is refusing SNF. PT then recommends home with home health PT, a hospital bed, manual wheelchair, and hoyer lift.  Patient suffers from gross weakness and metabolic encephalopathy which impairs their ability to perform daily activities like ambulating in the home.  A walker alone will not resolve the issues with performing activities of daily living. A wheelchair will allow patient to safely perform daily activities.  The patient can self propel in the home or has a caregiver who can provide assistance.        Follow Up Recommendations SNF(dtr refusing SNF per chart. HHPT if D/C home)    Equipment Recommendations  Wheelchair (measurements PT);Wheelchair cushion (measurements PT);Hospital bed(hoyer lifts)    Recommendations for Other Services       Precautions /  Restrictions Precautions Precautions: Fall Restrictions Weight Bearing Restrictions: No      Mobility  Bed Mobility Overal bed mobility: Needs Assistance Bed Mobility: Supine to Sit;Sit to Supine     Supine to sit: Max assist Sit to supine: Total assist   General bed mobility comments: Pt requiring maxA for LE management in sup to sit but modA to pull up with UEs  Transfers                    Ambulation/Gait                Stairs            Wheelchair Mobility    Modified Rankin (Stroke Patients Only)       Balance   Sitting-balance support: Bilateral upper extremity supported;Feet supported Sitting balance-Leahy Scale: Poor(modA with BUE support, totalA without UE support)                                       Pertinent Vitals/Pain Pain Assessment: Faces Faces Pain Scale: Hurts a little bit Pain Location: nose- NG tube Pain Intervention(s): Limited activity within patient's tolerance    Home Living Family/patient expects to be discharged to:: Private residence Living Arrangements: Children Available Help at Discharge: Family;Available 24 hours/day Type of Home: House Home Access: Stairs to enter Entrance Stairs-Rails: Psychiatric nurse of Steps: 4 Home Layout: One level Home Equipment: Grab bars - tub/shower;Walker - 2 wheels      Prior Function Level of Independence: Needs assistance   Gait / Transfers Assistance Needed: prior to past month was independent with no AD  and walked with daughter daily. Daughter reports is now shuffling with RW and supervision. Transferring to bedside commode at start of this admission.  ADL's / Homemaking Assistance Needed: was independent but is no longer cooking because of fatigue and needs some assist with ADL's        Hand Dominance   Dominant Hand: Right    Extremity/Trunk Assessment   Upper Extremity Assessment Upper Extremity Assessment: Overall WFL for  tasks assessed    Lower Extremity Assessment Lower Extremity Assessment: Generalized weakness RLE Deficits / Details: gross strength impairments. ROM significantly limited in knee flexion bilaterally with ~10 degrees flexion present. Pt with either increased tone or fighting against PT during this assessment. RLE Sensation: WNL LLE Sensation: WNL    Cervical / Trunk Assessment Cervical / Trunk Assessment: Kyphotic  Communication   Communication: HOH  Cognition Arousal/Alertness: Awake/alert Behavior During Therapy: Anxious;Impulsive Overall Cognitive Status: Impaired/Different from baseline Area of Impairment: Orientation;Memory;Safety/judgement;Awareness                 Orientation Level: Disoriented to;Place;Time;Situation Current Attention Level: Selective Memory: Decreased short-term memory Following Commands: Follows one step commands with increased time;Follows one step commands consistently Safety/Judgement: Decreased awareness of safety;Decreased awareness of deficits            General Comments      Exercises     Assessment/Plan    PT Assessment Patient needs continued PT services  PT Problem List Decreased strength;Decreased activity tolerance;Decreased balance;Decreased mobility;Decreased knowledge of use of DME       PT Treatment Interventions DME instruction;Functional mobility training;Therapeutic activities;Therapeutic exercise;Balance training;Neuromuscular re-education;Wheelchair mobility training;Patient/family education;Manual techniques    PT Goals (Current goals can be found in the Care Plan section)  Acute Rehab PT Goals Patient Stated Goal: To build some strength and get patient home PT Goal Formulation: With family Time For Goal Achievement: 02/02/19 Potential to Achieve Goals: Poor Additional Goals Additional Goal #1: Pt will mobilize utilizing a manual wheelchair for >30 feet with modA.    Frequency Min 2X/week   Barriers to  discharge        Co-evaluation               AM-PAC PT "6 Clicks" Mobility  Outcome Measure Help needed turning from your back to your side while in a flat bed without using bedrails?: Total Help needed moving from lying on your back to sitting on the side of a flat bed without using bedrails?: Total Help needed moving to and from a bed to a chair (including a wheelchair)?: Total Help needed standing up from a chair using your arms (e.g., wheelchair or bedside chair)?: Total Help needed to walk in hospital room?: Total Help needed climbing 3-5 steps with a railing? : Total 6 Click Score: 6    End of Session Equipment Utilized During Treatment: (none) Activity Tolerance: Patient limited by fatigue Patient left: in bed;with call bell/phone within reach;with family/visitor present(mits left off, daughter present, RN Percell Locus agreeable) Nurse Communication: Mobility status PT Visit Diagnosis: Muscle weakness (generalized) (M62.81);Unsteadiness on feet (R26.81);Adult, failure to thrive (R62.7);Difficulty in walking, not elsewhere classified (R26.2)    Time: 1017-1040 PT Time Calculation (min) (ACUTE ONLY): 23 min   Charges:   PT Evaluation $PT Eval Low Complexity: 1 Low PT Treatments $Therapeutic Activity: 8-22 mins        Zenaida Niece, PT, DPT Acute Rehabilitation Pager: [phone]   Zenaida Niece 01/19/2019, 11:06 AM

## 2019-01-19 NOTE — Progress Notes (Signed)
The patient has been expericencing agitation, calling out and refusing care since the beginning of my shift at 0700. Reassurance has been provided to the patient along with other therapies such as gospel music and soft lighting. [name] made a request that I administer medication to the patient when the patient refused. It was a mouth swab with medication and the patient was refusing to take anything by mouth at the time. I informed the daughter that I am not allowed to force medication upon the patient. If the patient refuses then I can not administer medication against her will. Daughter stated I was not patient enough to deal with geriatrics. I provided reassurance to the daughter that I am taking care of the patient to the best of my ability and I will provide the best care possible to her. [name], NT and I began to change the patient. The daughter requested that I administer powder to the patient's peri area. I informed that daughter that I would get the patient comfortable prior to administration of any medications. The daughter became agitated and verbally aggressive towards this Probation officer and approached me belligerently asking "are you sure we are going to be able to work together today?" I informed the daughter that my priority is to care for the patient in a safe manner, my goal was to make the patient comfortable and the powder could be administered after she was repositioned. The daughter verbalized understanding and I proceeded to readjust the patient and administered the powder as ordered. Will continue to monitor the patient.  1656: Patient continues to be agitated; She states she is tired and just wants to be left alone. Patient is incontinent, so she requires changing frequently. Patient becomes extremely irritable when we change her. Daughter is at bedside. Will continue to monitor.

## 2019-01-19 NOTE — Progress Notes (Signed)
Patient ID: Sydney [name], female   DOB: 04-25-1931, 85 y.o.   MRN: [id number]   Request made for placement of a percutaneous gastrostomy tube.  Given presence of ascites on CT of the abdomen and pelvis performed 01/06/2019, the patient is NOT a candidate for percutaneous G-tube placement given risk of persistent ascitic leak.  Above discussed with Dr. Roger Shelter.  Ronny Bacon, MD Pager #: [phone]

## 2019-01-20 DIAGNOSIS — R627 Adult failure to thrive: Secondary | ICD-10-CM | POA: Diagnosis not present

## 2019-01-20 LAB — GLUCOSE, CAPILLARY
Glucose-Capillary: 57 mg/dL — ABNORMAL LOW (ref 70–99)
Glucose-Capillary: 99 mg/dL (ref 70–99)

## 2019-01-20 MED ORDER — DEXTROSE 50 % IV SOLN
12.5000 g | INTRAVENOUS | Status: AC
  Administered 2019-01-20: 08:00:00 12.5 g via INTRAVENOUS

## 2019-01-20 MED ORDER — DEXTROSE-NACL 5-0.45 % IV SOLN
INTRAVENOUS | Status: AC
  Administered 2019-01-20: 10:00:00 via INTRAVENOUS

## 2019-01-20 MED ORDER — DEXTROSE 50 % IV SOLN
INTRAVENOUS | Status: AC
  Filled 2019-01-20: qty 50

## 2019-01-20 NOTE — Evaluation (Signed)
Occupational Therapy Evaluation Patient Details Name: Sydney [name] MRN: [id number] DOB: 31-May-1930 Today's Date: 01/20/2019    History of Present Illness Sydney [name] is a 85 y.o. 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 Pt pulled out NG tube 9/25   Clinical Impression   Pt admitted with above diagnoses, with several medical complications limiting ability to engage in BADL at desired level of ind. Per chart review, ethics involved and resistance from family in regard to end of life care planning. Dtr was in room at time of eval, stating her mother was ind prior to coming to hospital and "that is what we are getting her back to". At time of eval, pt presents with pronounced cognitive deficits listed below.She is a total A +2 for bed mobility. Further mobility deferred due to pt resistance, posing safety concerns from pt sliding off of bed and yelling "let me lay down". At this time further OT progression is not appropriate given pt prognosis. PT is addressing need for DME in the home (w/c, hoyer, hospital bed). Pt will need 24/7 total assist and DME listed below. OT recommending home with hospice or long term care facility. No further OT needs identified at this time. OT will sign off.     Follow Up Recommendations  Other (comment)(Home with hospice/ long term care facility)    Equipment Recommendations  Hospital bed;Wheelchair (measurements OT);Wheelchair cushion (measurements OT);Other (comment)(hoyer lift)    Recommendations for Other Services       Precautions / Restrictions Precautions Precautions: Fall Restrictions Weight Bearing Restrictions: No      Mobility Bed Mobility Overal bed mobility: Needs  Assistance Bed Mobility: Supine to Sit;Sit to Supine     Supine to sit: Total assist;+2 for physical assistance;+2 for safety/equipment Sit to supine: Total assist;+2 for safety/equipment;+2 for physical assistance   General bed mobility comments: total A for all bed mobility; multimodal cues for sequencing; pt was briefly in sitting EOB however resisting turnk flexion and knee flexion so unsafe to continue; pt returned to supine   Transfers                      Balance Overall balance assessment: Needs assistance Sitting-balance support: Bilateral upper extremity supported;Feet supported Sitting balance-Leahy Scale: Zero                                     ADL either performed or assessed with clinical judgement   ADL Overall ADL's : Needs assistance/impaired Eating/Feeding: NPO Eating/Feeding Details (indicate cue type and reason): had feeding tube but pulled it out prior to session and does not want it reinserted Grooming: Wash/dry face;Sitting;Bed level;Maximal assistance   Upper Body Bathing: Total assistance;Sitting;Bed level   Lower Body Bathing: Total assistance;Sitting/lateral leans;Sit to/from stand;Bed level   Upper Body Dressing : Total assistance;Sitting;Bed level   Lower Body Dressing: Total assistance;Sit to/from stand;Sitting/lateral leans   Toilet Transfer: Total assistance   Toileting- Clothing Manipulation and Hygiene: Total assistance   Tub/ Shower Transfer: Total assistance   Functional mobility during ADLs: Moderate assistance General ADL Comments: requiring total assist     Vision Baseline Vision/History: Wears glasses Patient Visual Report: No change from baseline Vision  Assessment?: No apparent visual deficits     Perception     Praxis      Pertinent Vitals/Pain Pain Assessment: Faces Faces Pain Scale: Hurts a little bit Pain Location: grimacing with bed mobility; back possibly  Pain Descriptors / Indicators:  Grimacing Pain Intervention(s): Monitored during session;Repositioned     Hand Dominance     Extremity/Trunk Assessment Upper Extremity Assessment Upper Extremity Assessment: Generalized weakness   Lower Extremity Assessment Lower Extremity Assessment: Defer to PT evaluation       Communication Communication Communication: HOH   Cognition Arousal/Alertness: Awake/alert Behavior During Therapy: Anxious;Impulsive;Restless Overall Cognitive Status: Impaired/Different from baseline Area of Impairment: Orientation;Memory;Safety/judgement;Awareness                 Orientation Level: Disoriented to;Place;Time;Situation Current Attention Level: Selective Memory: Decreased short-term memory Following Commands: Follows one step commands with increased time;Follows one step commands consistently Safety/Judgement: Decreased awareness of safety;Decreased awareness of deficits Awareness: Emergent Problem Solving: Decreased initiation;Requires tactile cues;Requires verbal cues General Comments: pt with confusion, not following commands. Agreeable to EOB and chair then yelling "let me lay down" with attempts to t/f   General Comments       Exercises     Shoulder Instructions      Home Living Family/patient expects to be discharged to:: Private residence Living Arrangements: Children Available Help at Discharge: Family;Available 24 hours/day Type of Home: House Home Access: Stairs to enter [employer] of Steps: 4 Entrance Stairs-Rails: Right;Left Home Layout: One level     Bathroom Shower/Tub: Tub only   Biochemist, clinical: Standard     Home Equipment: Grab bars - tub/shower;Walker - 2 wheels          Prior Functioning/Environment    Gait / Transfers Assistance Needed: prior to past month was independent with no AD and walked with daughter daily. Daughter reports is now shuffling with RW and supervision. Transferring to bedside commode at start of this  admission. ADL's / Homemaking Assistance Needed: was independent but is no longer cooking because of fatigue and needs some assist with ADL's            OT Problem List: Decreased strength;Decreased activity tolerance;Impaired balance (sitting and/or standing);Decreased knowledge of use of DME or AE;Decreased knowledge of precautions      OT Treatment/Interventions: Self-care/ADL training;DME and/or AE instruction;Balance training;Patient/family education;Therapeutic activities;Therapeutic exercise    OT Goals(Current goals can be found in the care plan section) Acute Rehab OT Goals Patient Stated Goal: per daughter to build some strength and get patient home OT Goal Formulation: With patient/family Time For Goal Achievement: 01/05/19 Potential to Achieve Goals: Poor  OT Frequency: Min 2X/week   Barriers to D/C:            Co-evaluation PT/OT/SLP Co-Evaluation/Treatment: Yes Reason for Co-Treatment: Complexity of the patient's impairments (multi-system involvement);Necessary to address cognition/behavior during functional activity;For patient/therapist safety;To address functional/ADL transfers PT goals addressed during session: Mobility/safety with mobility OT goals addressed during session: ADL's and self-care      AM-PAC OT "6 Clicks" Daily Activity     Outcome Measure Help from another person eating meals?: Total Help from another person taking care of personal grooming?: Total Help from another person toileting, which includes using toliet, bedpan, or urinal?: Total Help from another person bathing (including washing, rinsing, drying)?: Total Help from another person to put on and taking off regular upper body clothing?: Total Help from another person to put on and taking off regular lower body clothing?: Total  6 Click Score: 6   End of Session Nurse Communication: Mobility status  Activity Tolerance: Patient tolerated treatment well Patient left: in bed;with call  bell/phone within reach  OT Visit Diagnosis: Muscle weakness (generalized) (M62.81);Unsteadiness on feet (R26.81);Other symptoms and signs involving cognitive function                Time: 1457-1516 OT Time Calculation (min): 19 min Charges:  OT General Charges $OT Visit: 1 Visit OT Evaluation $OT Eval Moderate Complexity: 1 Mod  Zenovia Jarred, MSOT, OTR/L Behavioral Health OT/ Acute Relief OT [facility] Office: [phone]   Zenovia Jarred 01/20/2019, 4:09 PM

## 2019-01-20 NOTE — Progress Notes (Signed)
Pt verbalized she is having a fit and she doesn't want her cortrak back.

## 2019-01-20 NOTE — Progress Notes (Signed)
Physical Therapy Treatment Patient Details Name: Sydney [name] MRN: [id number] DOB: March 25, 1931 Today's Date: 01/20/2019    History of Present Illness [name] is a 85 y.o. 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 Pt pulled out NG tube 9/25    PT Comments    Patient seen for mobility progression. Pt presents with impaired cognition and unable to follow single step cues during session. Pt requires total A +2 for all bed mobility. Daughter present throughout session. PT will continue to follow acutely as per POC.    Follow Up Recommendations  SNF(dtr refusing SNF per chart. HHPT if D/C home)     Equipment Recommendations  Wheelchair (measurements PT);Wheelchair cushion (measurements PT);Hospital bed; [facility] lift    Recommendations for Other Services       Precautions / Restrictions Precautions Precautions: Fall Restrictions Weight Bearing Restrictions: No    Mobility  Bed Mobility Overal bed mobility: Needs Assistance Bed Mobility: Supine to Sit;Sit to Supine     Supine to sit: Total assist;+2 for physical assistance;+2 for safety/equipment Sit to supine: Total assist;+2 for safety/equipment;+2 for physical assistance   General bed mobility comments: total A for all bed mobility; multimodal cues for sequencing; pt was briefly in sitting EOB however resisting turnk flexion and knee flexion so unsafe to continue; pt returned to supine   Transfers                    Ambulation/Gait                 Stairs             Wheelchair Mobility    Modified Rankin (Stroke Patients Only)       Balance Overall balance assessment: Needs assistance   Sitting balance-Leahy Scale:  Zero                                      Cognition Arousal/Alertness: Awake/alert Behavior During Therapy: Anxious;Impulsive;Restless Overall Cognitive Status: Impaired/Different from baseline                                 General Comments: pt confused and not following commands during session; pt initially agreeable to sitting EOB and then stated she wants to sit up in the chair however pt yelling "let me lay down" while attempting to sit up       Exercises      General Comments        Pertinent Vitals/Pain Pain Assessment: Faces Pain Location: grimacing with bed mobility; back possibly  Pain Descriptors / Indicators: Grimacing Pain Intervention(s): Monitored during session;Repositioned    Home Living                      Prior Function            PT Goals (current goals can now be found in the care plan section) Acute Rehab PT Goals Patient Stated Goal: per daughter to build some strength and get patient home Progress towards PT goals: Progressing toward goals    Frequency    Min 2X/week  PT Plan Current plan remains appropriate    Co-evaluation PT/OT/SLP Co-Evaluation/Treatment: Yes Reason for Co-Treatment: Complexity of the patient's impairments (multi-system involvement);Necessary to address cognition/behavior during functional activity;For patient/therapist safety;To address functional/ADL transfers PT goals addressed during session: Mobility/safety with mobility        AM-PAC PT "6 Clicks" Mobility   Outcome Measure  Help needed turning from your back to your side while in a flat bed without using bedrails?: Total Help needed moving from lying on your back to sitting on the side of a flat bed without using bedrails?: Total Help needed moving to and from a bed to a chair (including a wheelchair)?: Total Help needed standing up from a chair using your arms (e.g., wheelchair or bedside chair)?: Total Help  needed to walk in hospital room?: Total Help needed climbing 3-5 steps with a railing? : Total 6 Click Score: 6    End of Session   Activity Tolerance: Other (comment);Treatment limited secondary to agitation(cognition limiting ability to participate) Patient left: in bed;with call bell/phone within reach;with family/visitor present Nurse Communication: Mobility status PT Visit Diagnosis: Muscle weakness (generalized) (M62.81);Unsteadiness on feet (R26.81);Adult, failure to thrive (R62.7);Difficulty in walking, not elsewhere classified (R26.2)     Time: [id number] PT Time Calculation (min) (ACUTE ONLY): 16 min  Charges:  $Therapeutic Activity: 8-22 mins                     Earney Navy, PTA Acute Rehabilitation Services Pager: [phone] Office: [phone]     Darliss Cheney 01/20/2019, 3:34 PM

## 2019-01-20 NOTE — Progress Notes (Signed)
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 CT of the abdomen and pelvis was negative for acute intra-abdominal or pelvic abnormality. She was found to have mildly elevated LFTs along with hyponatremia and pancytopenia. She was given IV fluids.ID and GI were consulted. Palliative care was subsequently consulted for goals of care discussion at admission.  Multidisciplinary meeting previously with medicine, administration, palliative care, GI, nursing staff, patient, patient's pastor, and patient advocate.  Lengthy discussion about patient's ongoing, albeit minimal, improvement with NG tube feeds status post antibiotics and supportive care.  Daughter remains some frustrated given no clear precipitating diagnosis which makes prognosis/timeline somewhat difficult to define.  Patient's daughter is requesting transfer to a different facility for possible further evaluation and work-up.  We discussed that given our extensive work-up here the only remaining evaluations left are MRCP, endoscopy and possibly liver biopsy, all of these would require likely anesthesia/sedation and risks would likely outweigh benefits at this point given patient's advanced age and weakened condition given poor p.o. intake. Given the patient previously refused disposition to SNF and pending possible transition to outside facility, our other option for disposition would ultimately be home with daughter to continue tube feeds and supportive care as she sees fit.  At this time patient has no obvious acute illness from which she suffers which has caused her mental status change.  Given her improvement  at this time we would continue to focus on supportive care and NG tube feeds, as further work-up can be facilitated at an outpatient procedure or other facility at this time.  Hopefully over the next 24 to 48 hours patient can be reevaluated by PT and speech therapy with further engagement and recommendations about advancement of diet if safe.  Patient's mental status continues to improve minimally, previously patient has only able to moan/calling out in pain -now able to answer questions appropriately, comments "I just want to go home". Patient does have ongoing NG tube with medications and food per daughter's wishes. Patient has had extensive work-up as below including imaging, labs all of which essentially have been generally unremarkable for overt cause of patient's mental and physical decline. Patient recently treated for questionable UTI given febrile events and mental status changes in hopes that this will improve her status although very minimal if any improvement, patient continues to have markedly poor p.o. intake and likely concurrent depression ongoing. Patient is an advanced age, appears severely malnourished at this point -not consistent with any acute dietary changes but appears to be more chronic, previously refusing all medications and management although daughter continues to request ongoing treatment, multiple consults as well as ongoing evaluation despite extensive negative work-up as below. Per discussion with palliative care as well as GI it appears the patient's daughter is unfortunately unable to cope with the fact that her mother is actively dying. We continue to follow along closely, offer what support we can, ethics has been consulted given inappropriate requests by daughter for consults and treatment which in my opinion would be futile (requesting at one point for obgyn to evaluate for possible exam to rule out vaginal cancer which we discussed wouldn't likely be tolerated - patient  then asked for blood work to rule  GU cancers -which certainly would not have any meaningful impact on her current disposition).  Additionally patient continues to be somewhat agitated, unclear if patient is in pain or simply agitated due to her worsening mental status, but daughter is requesting we hold off on any further pain medication or anxiolytics.  We continue to recommend to focus on comfort measures and comfort care at this time, but daughter continues to request further workup  I am doubtful that any further imaging, lab work or procedures at this time will change our trajectory.  We unfortunately do not have a working diagnosis for patient's decline other than markedly poor p.o. intake in the setting of likely depression other than possible UTI given recent fever and patient's mental status changes, but have now been treated appropriately with minimal improvement in patient's status.  Patient's daughter also continues to be fixated on single dose of 0.'25mg'$  lorazepam and 0.'5mg'$  of morphine on night shift on 01/04/19 and 01/05/19 respectively that she believes has caused her mother's current nonverbal condition despite our explanation that this medication would have likely cleared within a few hours of administration.  At this time patient's outlook remains grim, patient remains a full code despite discussion with family as patient had previously noted " I want everything done" which at this time precludes any further discussion about code/palliative/hospice.  Patient's mental status remains remarkably poor, patient now completed antibiotic coverage for questionable UTI.  Recent repeat abdominal imaging remarkable for ascites only, not surprising given patient's very low albumin level again in the setting of poor p.o. intake.  Patient remains high risk for worsening mental status, aspiration, elevated risk of morbidity mortality given advanced age, chronic comorbid conditions and  above.  ---------------------------------------------------------------------------------------------------------------------------  Subjective:  The patient was seen and examined this morning, she has pulled out her NG tube earlier this morning per nursing staff.  She remained awake alert but confused, able to move state her name.  Otherwise she cannot engage in conversation or have a meaningful discussion.  Per nursing staff was agitated overnight continues to be disoriented -She pulled out her NG tube.  Has not been reinserted Patient daughter was informed by nursing staff Per nursing staff mentation waxes and wanes   No reported visible shortness of breath, pain, nausea or vomiting.  No recorded fever or chills in last 12 hours. --------------------------------------------------------------------------------------------------------------------------    Assessment & Plan:   Principal Problem:   Adult failure to thrive Active Problems:   Pancytopenia (HCC)   Hyponatremia   Protein calorie malnutrition (HCC)   History of TIA (transient ischemic attack)   Generalized weakness   Pressure injury of skin   Transaminitis   Malnutrition of moderate degree   Evaluation by psychiatric service required   MDD (major depressive disorder)  Failure to thrive in adult/severe protein calorie malnutrition/hypoalbuminemia* -01/20/2019 patient continued to deteriorate, pulled out her NG tube. Daughter was notified, possibility of reinserting NG tube is considered Encouraging p.o. intake, IV fluids -01/19/2019 -extensive discussion was held with the patient's daughter.  Including plan of care, continue patient deterioration.  She is still optimistic that patient will do well, she was/full care and full code.   - CT head negative for acute abnormality see results below -MRI brain negative acute stroke see results below - unclear etiology but possible component of depression.  See depression  below - PT/OT recommends SNF.  Daughter declined - Poor oral intake during hospitalization - 9/8 core track placed.  Tube feedings at goal - Per  EMR and hospital staff patient's interaction with myself the best they have seen her since admission. -9/18 overnight patient had what sounded like residuals from her core track tube feedings held.  KUB ordered and pending.  Will restart feeds as soon as safe. - Patient continues to decline.  Reevaluated by consultants including IR not a candidate for PEG tube placement.  -  If patient to become a SNF candidate will require PEG tube. - 9/19 continue tube feeds, RN staff/daughter to encourage patient to take p.o. nystatin in effort to clear up her candidiasis. -9/22 bedside swallow more successful.  Dysphagia 3 diet.  Unfortunately patient so weak will not be able to meet her calorie intake just through p.o feeds. - 9/22 patient accidentally removed core track tube.  IR to reinstall tube today.  Restart tube feeds once reinstalled.  Also encourage patient p.o. intake..    -Spoke with [name] daughter concerning placement of PEG tube, discussed with GI possible need for PEG tube placement by the end of the week with likely ultimate disposition home .... It looks like GI might have signed off, did not recommending PEG tube  -01/19/2019  - interventional radiologist Dr. Pascal Lux was consulted for possible PEG tube placement today 01/19/2019 --- he has refused the case in detail, including reviewing the images.  Due to patient's comorbidities and poor imaging ascites patient is determined not to be a candidate for PEG tube placement.  Hx TIA - Has not received Plavix since 12/24/2018 patient refusing. -Continue aspirin  Acute metabolic encephalopathy, improving minimally -Multifactorial failure to thrive, infection, shock liver, metabolic derangement -2/69 appears to be improving slightly patient shakes her head yes and no to questions follows commands though  refuses/cannot verbalize - Minimize sedating medication -9/22 continues to wax and wane but appears clear every day.  Shock liver/elevated LFTs -Stable monitoring LFTs,  - Open GI to continue to follow, appreciate their input  High atraumatic Gonczy- Unclear etiology - AST/ALT/alk phos remain elevated - Hepatitis panel negative, EBV and CMV IgM negative - CT abdomen and pelvis negative evidence of acute intra-abdominal/pelvic abnormality.  Diffuse fluid within the colon suggestive of enteritis vs diarrheal illness (negative abdominal tenderness, or other GI symptoms) - Alkaline phosphatase, AST, ALT rising.  Most likely not secondary to antibiotics as they were discontinued 9/13.  Will continue to monitor along with GI - RUQ ultrasound showed gallbladder sludge see below - KUB on 12/29/2018 with mild diffuse gaseous prominence without obstructive pattern.  - Suspicion for possible mild shock liver given she was hypotensive on presentation.   - GI also believes that this transaminitis could reflect from skeletal muscle release as well - CK, MB minimally elevated -but improving - GI following, repeat CT on 01/06/2019 remarkable for ascites -9/18 discussed case with Dr. Justice Britain GI who spoken to daughter and plan is for liver Doppler ultrasound.  If abnormalities noted on ultrasound will most likely proceed with MRI/MRCP and possibly a liver biopsy. -9/22 Family not wanting an aggressive procedure performed (patient would not do well).   Major depressive disorder - Recent loss of sister 2 months ago with extreme ataxia and failure to thrive, daughter continues to think that this therapy is completely wrong. -Psychiatry consulted recommended initiation of antidepressant therapy.  Patient nor daughter interested in initiating treatment -although patient remains on Remeron  UTI/yeast infection in groin and oral thrush, resolving - Now completed antibiotic course - nystatin topical powder to  groin - Nystatin swish and swallow 46m  5 x per day or Clotrimazole 5 times daily - ID following patient - Speech continues to follow along, patient tolerating p.o. quite poorly  Leukopenia - Resolved  Thrombocytopenia -Most likely secondary to patient's liver dysfunction.  Currently no signs or symptoms of occult bleed - Started on vitamin B12 parenterally as she has had low levels of vitamin B12 during last admission -No further work-up indicated, patient can follow-up in outpatient setting with indicated -9/21 continues to improve  AKI/hyponatremia/hypokalemia -Resolved -Monitoring  Refeeding syndrome, resolving - Now that patient receiving nutrition continues to have metabolic derangements - Resolving: Mg/Phos WNL  Sacral cubitus ulcer stage II - Continue care per wound care recommendations  Loose stools, in the setting of tube feeds - Afebrile , negative leukocytosis, negative abdominal pain.  No indication for C. difficile work-up -NOTE; previous history of C. difficile colitis back in 2018 in which she received a fecal transplant - Patient was covered by vancomycin per ID guidelines being treated for UTI as above - Resolving   Dehydration/hypoglycemia -Due to poor p.o. intake, anticipating D5 half-normal saline IV fluid resuscitation -Encouraging p.o. intake -Anticipating reinsertion of NG tube and continue tube feeds    Ethics: - Patient now re-presenting after recent hospitalization, was home for roughly 1-2 days and returned with worsening weakness, severe dehydration, and adult failure to thrive.  Daughter believes that she just will require some nutrition through artificial means to get her back to her previous baseline. She states that her mother was independent with her ADLs and cooking for herself prior to admission (unclear timeframe).  Daughter states that her current decline was acute and only started on 12/28/2018; which is contrary to all nursing reports  and previous provider reports during her care during this hospitalization (patient weighed 45kg over a month ago -admitted at 48 kg).  Daughter seems to be struggling with the notion that her mother is in the active dying process in regards to her adult failure to thrive in which she is refusing most if not all interventions and has not been eating or drinking now for weeks. - Given this patient's advanced age, repeat hospitalization in less than a few days from previous discharge, patient's prognosis is grim, and this has been relayed to the patient's daughter on multiple occasions by multiple providers. - Ethics has been consulted, following along, we appreciate insight recommendations on this difficult case  DVT prophylaxis: Lovenox/SCD Code Status: Full Family Communication: 01/18/19 Dr. Avon Gully has updated patient's daughter apparently continues to request full care to be continued The phone number provided was called no answer yet.  Disposition Plan: TBD   Consultants:  ID GI Hematology Palliative care IR  Antimicrobials: Anti-infectives (From admission, onward)   Start     Stop   01/06/19 2200  cephALEXin (KEFLEX) 250 MG/5ML suspension 500 mg     01/08/19 2141   01/06/19 1045  ceFAZolin (ANCEF) IVPB 1 g/50 mL premix  Status:  Discontinued     01/06/19 1734   01/06/19 1000  vancomycin (VANCOCIN) 50 mg/mL oral solution 125 mg     01/08/19 1226   01/05/19 1200  cefTRIAXone (ROCEPHIN) 1 g in sodium chloride 0.9 % 100 mL IVPB  Status:  Discontinued     01/06/19 1037      LINES / TUBES:  Core track placed 9/8>>> accidentally removed 01/17/2019 Core track replaced 9/22>> ongoing  Continuous Infusions:  sodium chloride     dextrose 5 % and 0.45% NaCl 125 mL/hr at 01/20/19 1017  feeding supplement (OSMOLITE 1.2 CAL) 1,000 mL (01/19/19 1543)   Objective: Vitals:   01/19/19 0639 01/19/19 1621 01/19/19 2148 01/20/19 0559  BP: (!) 159/94 138/87 133/73 (!) 137/102  Pulse:  (!) 103 100 (!) 105 96  Resp:  20 17   Temp: 98.1 F (36.7 C) 98.6 F (37 C) 98.8 F (37.1 C) (!) 97.5 F (36.4 C)  TempSrc: Oral Axillary Oral Oral  SpO2: 99% 99% 99%   Weight:      Height:        Intake/Output Summary (Last 24 hours) at 01/20/2019 1042 Last data filed at 01/19/2019 2207 Gross per 24 hour  Intake 3 ml  Output --  Net 3 ml   Filed Weights   01/03/19 0500 01/04/19 0500 01/15/19 0500  Weight: 61 kg 61 kg 60 kg    Physical Exam: BP (!) 137/102 (BP Location: Left Arm)    Pulse 96    Temp (!) 97.5 F (36.4 C) (Oral)    Resp 17    Ht '5\' 3"'$  (1.6 m)    Wt 60 kg    SpO2 99%    BMI 23.43 kg/m    Physical Exam   BP (!) 137/102 (BP Location: Left Arm)    Pulse 96    Temp (!) 97.5 F (36.4 C) (Oral)    Resp 17    Ht '5\' 3"'$  (1.6 m)    Wt 60 kg    SpO2 99%    BMI 23.43 kg/m    Physical Exam  Constitution: Severely malnutrition, cachectic, laying in bed, confused but able to mention his name Psychiatric: Agitated verbal but does not make sense,  HEENT: Normocephalic, PERRL, otherwise with in Normal limits  Chest:Chest symmetric Cardio vascular:  S1/S2, RRR, No murmure, No Rubs or Gallops  pulmonary: Clear to auscultation bilaterally, respirations unlabored, negative wheezes / crackles Abdomen: Soft, non-tender, non-distended, bowel sounds,no masses, no organomegaly Muscular skeletal: Limited exam -severe generalized weaknesses,- in bed, able to move all 4 extremities, Normal strength,  Neuro: CNII-XII intact. , normal motor and sensation, reflexes intact  Extremities:  Severe muscle wasting, no pitting edema lower extremities, +2 pulses  Skin: Dry, warm to touch, negative for any Rashes, No open wounds Wounds: per nursing documentation      Data Reviewed  CBC: Recent Labs  Lab 01/14/19 0316 01/15/19 0444 01/16/19 0349 01/17/19 0502  WBC 7.1 6.4 6.8 7.1  NEUTROABS 5.8 5.5 5.9 6.2  HGB 10.1* 9.6* 9.5* 9.3*  HCT 29.9* 26.6* 27.0* 28.1*  MCV 92.0 89.9 90.9  95.9  PLT 96* 101* 109* 621*   Basic Metabolic Panel: Recent Labs  Lab 01/14/19 0316 01/14/19 1506 01/15/19 0444 01/16/19 0349 01/17/19 0502 01/18/19 0455  NA 134* 136  --  135 138 138  K 4.7 4.2  --  4.0 4.1 3.6  CL 110 113*  --  111 114* 113*  CO2 17* 19*  --  18* 18* 19*  GLUCOSE 87 103*  --  106* 102* 117*  BUN 26* 24*  --  23 23 26*  CREATININE 0.86 0.76  --  0.73 0.65 0.71  CALCIUM 8.4* 8.1*  --  7.6* 7.6* 7.7*  MG 2.2 1.9 2.3 2.0 1.9 1.9  PHOS 1.9* 1.6* 3.8 3.6 2.7 2.9   GFR: Estimated Creatinine Clearance: 41.8 mL/min (by C-G formula based on SCr of 0.71 mg/dL). Liver Function Tests: Recent Labs  Lab 01/14/19 0316 01/16/19 0349 01/17/19 0502 01/18/19 0455  AST 177* 206* 253* 247*  ALT 75* 113* 143* 157*  ALKPHOS 643* 506* 490* 464*  BILITOT 1.0 0.6 0.8 0.6  PROT 5.6* 5.0* 5.5* 5.6*  ALBUMIN 1.8* 1.7* 1.8* 1.8*   No results for input(s): LIPASE, AMYLASE in the last 168 hours. No results for input(s): AMMONIA in the last 168 hours. Coagulation Profile: Recent Labs  Lab 01/15/19 0444  INR 1.0   CBG: Recent Labs  Lab 01/17/19 0810 01/18/19 0736 01/19/19 0633 01/20/19 0752 01/20/19 0846  GLUCAP 94 107* 135* 57* 99  Urine analysis:    Component Value Date/Time   COLORURINE YELLOW 01/08/2019 2015   APPEARANCEUR HAZY (A) 01/08/2019 2015   LABSPEC 1.018 01/08/2019 2015   PHURINE 6.0 01/08/2019 2015   GLUCOSEU NEGATIVE 01/08/2019 2015   HGBUR SMALL (A) 01/08/2019 2015   BILIRUBINUR NEGATIVE 01/08/2019 2015   KETONESUR NEGATIVE 01/08/2019 2015   PROTEINUR NEGATIVE 01/08/2019 2015   UROBILINOGEN 0.2 03/05/202014 1048   NITRITE NEGATIVE 01/08/2019 2015   LEUKOCYTESUR NEGATIVE 01/08/2019 2015   No results found for this or any previous visit (from the past 240 hour(s)).   Radiology Studies: No results found. Scheduled Meds:  aspirin  81 mg Per Tube Daily   chlorhexidine  15 mL Mouth Rinse BID   collagenase   Topical Daily   dextrose        enoxaparin (LOVENOX) injection  40 mg Subcutaneous R91M   folic acid  1 mg Per Tube Daily   Gerhardt's butt cream   Topical TID   latanoprost  1 drop Both Eyes QHS   mouth rinse  15 mL Mouth Rinse q12n4p   megestrol  400 mg Per Tube Daily   mirtazapine  30 mg Per NG tube QHS   multivitamin  15 mL Per Tube Daily   nystatin  5 mL Oral 5 X Daily   nystatin   Topical BID   pantoprazole sodium  40 mg Per Tube Daily   sodium chloride flush  3 mL Intravenous Q12H   Continuous Infusions:  sodium chloride     dextrose 5 % and 0.45% NaCl 125 mL/hr at 01/20/19 1017   feeding supplement (OSMOLITE 1.2 CAL) 1,000 mL (01/19/19 1543)     LOS: 31 days   The patient is critically ill with multiple organ systems failure and requires high complexity decision making for assessment and support, frequent evaluation and titration of therapies, application of advanced monitoring technologies and extensive interpretation of multiple databases. Critical Care Time devoted to patient care services described in this note  Time spent: 40 minutes   Deatra James, MD., [facility].  [facility] Pager [phone]  If 7PM-7AM, please contact night-coverage [email or web address] Password [facility] 01/20/2019, 10:42 AM

## 2019-01-20 NOTE — Progress Notes (Signed)
Walked in pt's room and noticed cortrak was out of pt's nose. Notified MD on call, waiting for response.

## 2019-01-20 NOTE — Progress Notes (Signed)
Talked to pts daughter, she verbalized she doesn't want the cortrak back if her mother doesn't want it back.

## 2019-01-21 DIAGNOSIS — R627 Adult failure to thrive: Secondary | ICD-10-CM | POA: Diagnosis not present

## 2019-01-21 LAB — COMPREHENSIVE METABOLIC PANEL
ALT: 102 U/L — ABNORMAL HIGH (ref 0–44)
AST: 110 U/L — ABNORMAL HIGH (ref 15–41)
Albumin: 2.1 g/dL — ABNORMAL LOW (ref 3.5–5.0)
Alkaline Phosphatase: 315 U/L — ABNORMAL HIGH (ref 38–126)
Anion gap: 8 (ref 5–15)
BUN: 25 mg/dL — ABNORMAL HIGH (ref 8–23)
CO2: 18 mmol/L — ABNORMAL LOW (ref 22–32)
Calcium: 7.8 mg/dL — ABNORMAL LOW (ref 8.9–10.3)
Chloride: 113 mmol/L — ABNORMAL HIGH (ref 98–111)
Creatinine, Ser: 0.72 mg/dL (ref 0.44–1.00)
GFR calc Af Amer: >60 mL/min (ref >60)
GFR calc non Af Amer: >60 mL/min (ref >60)
Glucose, Bld: 72 mg/dL (ref 70–99)
Potassium: 3.6 mmol/L (ref 3.5–5.1)
Sodium: 139 mmol/L (ref 135–145)
Total Bilirubin: 0.8 mg/dL (ref 0.3–1.2)
Total Protein: 5.6 g/dL — ABNORMAL LOW (ref 6.5–8.1)

## 2019-01-21 LAB — MAGNESIUM: Magnesium: 1.9 mg/dL (ref 1.7–2.4)

## 2019-01-21 LAB — GLUCOSE, CAPILLARY: Glucose-Capillary: 79 mg/dL (ref 70–99)

## 2019-01-21 LAB — PHOSPHORUS: Phosphorus: 3.4 mg/dL (ref 2.5–4.6)

## 2019-01-21 MED ORDER — MIRTAZAPINE 30 MG PO TBDP
30.0000 mg | ORAL_TABLET | Freq: Every day | ORAL | Status: DC
  Administered 2019-01-22 – 2019-01-30 (×9): 30 mg via ORAL
  Filled 2019-01-21 (×11): qty 1

## 2019-01-21 MED ORDER — PRO-STAT SUGAR FREE PO LIQD
30.0000 mL | Freq: Two times a day (BID) | ORAL | Status: DC
  Administered 2019-01-21 – 2019-01-29 (×11): 30 mL via ORAL
  Filled 2019-01-21 (×17): qty 30

## 2019-01-21 MED ORDER — ASPIRIN EC 81 MG PO TBEC
81.0000 mg | DELAYED_RELEASE_TABLET | Freq: Every day | ORAL | Status: DC
  Administered 2019-01-22 – 2019-01-31 (×6): 81 mg via ORAL
  Filled 2019-01-21 (×11): qty 1

## 2019-01-21 MED ORDER — ADULT MULTIVITAMIN W/MINERALS CH
1.0000 | ORAL_TABLET | Freq: Every day | ORAL | Status: DC
  Administered 2019-01-22 – 2019-01-31 (×7): 1 via ORAL
  Filled 2019-01-21 (×11): qty 1

## 2019-01-21 MED ORDER — PANTOPRAZOLE SODIUM 40 MG PO TBEC
40.0000 mg | DELAYED_RELEASE_TABLET | Freq: Every day | ORAL | Status: DC
  Administered 2019-01-21 – 2019-01-31 (×8): 40 mg via ORAL
  Filled 2019-01-21 (×11): qty 1

## 2019-01-21 MED ORDER — MEGESTROL ACETATE 400 MG/10ML PO SUSP
400.0000 mg | Freq: Three times a day (TID) | ORAL | Status: DC
  Administered 2019-01-25 – 2019-01-30 (×10): 400 mg via ORAL
  Filled 2019-01-21 (×32): qty 10

## 2019-01-21 MED ORDER — ENSURE ENLIVE PO LIQD: 237.0000 mL | Freq: Three times a day (TID) | ORAL | Status: DC

## 2019-01-21 NOTE — Progress Notes (Signed)
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 CT of the abdomen and pelvis was negative for acute intra-abdominal or pelvic abnormality. She was found to have mildly elevated LFTs along with hyponatremia and pancytopenia. She was given IV fluids.ID and GI were consulted. Palliative care was subsequently consulted for goals of care discussion at admission.  Multidisciplinary meeting previously with medicine, administration, palliative care, GI, nursing staff, patient, patient's pastor, and patient advocate.  Lengthy discussion about patient's ongoing, albeit minimal, improvement with NG tube feeds status post antibiotics and supportive care.  Daughter remains some frustrated given no clear precipitating diagnosis which makes prognosis/timeline somewhat difficult to define.  Patient's daughter is requesting transfer to a different facility for possible further evaluation and work-up.  We discussed that given our extensive work-up here the only remaining evaluations left are MRCP, endoscopy and possibly liver biopsy, all of these would require likely anesthesia/sedation and risks would likely outweigh benefits at this point given patient's advanced age and weakened condition given poor p.o. intake. Given the patient previously refused disposition to SNF and pending possible transition to outside facility, our other option for disposition would ultimately be home with daughter to continue tube feeds and supportive care as she sees fit.  At this time patient has no obvious acute illness from which she suffers which has caused her mental status change.  Given her improvement  at this time we would continue to focus on supportive care and NG tube feeds, as further work-up can be facilitated at an outpatient procedure or other facility at this time.  Hopefully over the next 24 to 48 hours patient can be reevaluated by PT and speech therapy with further engagement and recommendations about advancement of diet if safe.  Patient's mental status continues to improve minimally, previously patient has only able to moan/calling out in pain -now able to answer questions appropriately, comments "I just want to go home". Patient does have ongoing NG tube with medications and food per daughter's wishes. Patient has had extensive work-up as below including imaging, labs all of which essentially have been generally unremarkable for overt cause of patient's mental and physical decline. Patient recently treated for questionable UTI given febrile events and mental status changes in hopes that this will improve her status although very minimal if any improvement, patient continues to have markedly poor p.o. intake and likely concurrent depression ongoing. Patient is an advanced age, appears severely malnourished at this point -not consistent with any acute dietary changes but appears to be more chronic, previously refusing all medications and management although daughter continues to request ongoing treatment, multiple consults as well as ongoing evaluation despite extensive negative work-up as below. Per discussion with palliative care as well as GI it appears the patient's daughter is unfortunately unable to cope with the fact that her mother is actively dying. We continue to follow along closely, offer what support we can, ethics has been consulted given inappropriate requests by daughter for consults and treatment which in my opinion would be futile (requesting at one point for obgyn to evaluate for possible exam to rule out vaginal cancer which we discussed wouldn't likely be tolerated - patient  then asked for blood work to rule  GU cancers -which certainly would not have any meaningful impact on her current disposition).  Additionally patient continues to be somewhat agitated, unclear if patient is in pain or simply agitated due to her worsening mental status, but daughter is requesting we hold off on any further pain medication or anxiolytics.  We continue to recommend to focus on comfort measures and comfort care at this time, but daughter continues to request further workup  I am doubtful that any further imaging, lab work or procedures at this time will change our trajectory.  We unfortunately do not have a working diagnosis for patient's decline other than markedly poor p.o. intake in the setting of likely depression other than possible UTI given recent fever and patient's mental status changes, but have now been treated appropriately with minimal improvement in patient's status.  Patient's daughter also continues to be fixated on single dose of 0.14m lorazepam and 0.549mof morphine on night shift on 01/04/19 and 01/05/19 respectively that she believes has caused her mother's current nonverbal condition despite our explanation that this medication would have likely cleared within a few hours of administration.  At this time patient's outlook remains grim, patient remains a full code despite discussion with family as patient had previously noted " I want everything done" which at this time precludes any further discussion about code/palliative/hospice.  Patient's mental status remains remarkably poor, patient now completed antibiotic coverage for questionable UTI.  Recent repeat abdominal imaging remarkable for ascites only, not surprising given patient's very low albumin level again in the setting of poor p.o. intake.  Patient remains high risk for worsening mental status, aspiration, elevated risk of morbidity mortality given advanced age, chronic comorbid conditions and  above.  ---------------------------------------------------------------------------------------------------------------------------  Subjective: The patient was seen and examined this morning stable no acute distress.  To our surprise patient has had to be much more awake alert this morning following command able to pronounce her name and her daughter's name correctly. Not oriented to place and time.  Per nursing staff has improved with encouragement and assist. Yesterday morning patient had removed her NG tube, daughter was notified NG tube has not been reinserted.  Patient/nursing staff/daughter are aware that she is not a PEG tube candidate  No visible shortness of breath, pain, nausea or vomiting.  No recorded fever or chills in last 12 hours. --------------------------------------------------------------------------------------------------------------------------    Assessment & Plan:   Principal Problem:   Adult failure to thrive Active Problems:   Pancytopenia (HCC)   Hyponatremia   Protein calorie malnutrition (HCC)   History of TIA (transient ischemic attack)   Generalized weakness   Pressure injury of skin   Transaminitis   Malnutrition of moderate degree   Evaluation by psychiatric service required   MDD (major depressive disorder)  Failure to thrive in adult/severe protein calorie malnutrition/hypoalbuminemia* -01/21/2019 -for some on specific reason patient is woken up, more cooperative -regimen increased her p.o. intake.  -01/20/2019  pulled out her NG tube, patient has did not tolerate reinsertion of NG tube Daughter was notified, possibility of reinserting NG tube is considered Encouraging p.o. intake, PRN  IV fluids -01/19/2019 -extensive discussion was held with the patient's daughter.  Including plan of care, continue patient deterioration.  She is still optimistic that patient will do well, she was/full care and full code.   - CT head negative for acute  abnormality see results below -MRI brain negative acute stroke see results below - unclear etiology but possible component of depression.  See depression  below - PT/OT recommends SNF.  Daughter declined   Failure to thrive, poor p.o. intake, severely  malnourished, cachexia    - 9/8 core track placed.  Tube feedings at goal - Per EMR and hospital staff patient's interaction with myself the best they have seen her since admission. -9/18 overnight patient had what sounded like residuals from her core track tube feedings held.   KUB ordered and pending.  Will restart feeds as soon as safe. - Has been constantly reassessed by GI interventional radiology for NG tube placement she do not to be a candidate due to ascites, comorbidities.  - 9/19 continue tube feeds, RN staff/daughter to encourage patient to take p.o. nystatin in effort to clear up her candidiasis.  -9/22 bedside swallow more successful.  Dysphagia 3 diet.  Unfortunately patient so weak will not be able to meet her calorie intake just through p.o feeds.  - 9/22 patient accidentally removed core track tube.  IR to reinstall tube today.  Restart tube feeds once reinstalled.  Also encourage patient p.o. intake..    -Spoke with [name] daughter concerning placement of PEG tube, GI has not recommending to pursue with liver biopsy or PEG tube placement as she is a poor candidate due to comorbidities, which may contribute to patient trajectory of poor prognosis  -01/19/2019  - IR, interventional radiologist Dr. Pascal Lux was consulted for possible PEG tube placement IR has reviewed all images, the patient case in detail-given all comorbidities, on a trajectory of poor prognosis, and ascites patient was deemed not a candidate for PEG tube placement  -01/20/19 -the patient has removed her NG tube --was notified, D5 half-normal saline with an amp of D50 I was given for total of 6 hours due to transient hypoglycemia --- improved  01/21/2019 injuries  to remain to be out, patient is noted much more awake alert, following some commands, cooperative with encourage p.o. intake per nursing staff Dietitian consulted supplemental dietary will be placed Dietary supplement Megace is increased in dose     Hx TIA - Has not received Plavix since 12/24/2018 patient refusing. -Continue aspirin  Acute metabolic encephalopathy, improving minimally -Multifactorial failure to thrive, infection, shock liver, metabolic derangement -7/82 appears to be improving slightly patient shakes her head yes and no to questions follows commands though refuses/cannot verbalize - Minimize sedating medication -9/22 continues to wax and wane but appears clear every day.  Shock liver/elevated LFTs -Stable monitoring LFTs,  - Open GI to continue to follow, appreciate their input  High atraumatic Gonczy- Unclear etiology - AST/ALT/alk phos remain elevated - Hepatitis panel negative, EBV and CMV IgM negative - CT abdomen and pelvis negative evidence of acute intra-abdominal/pelvic abnormality.  Diffuse fluid within the colon suggestive of enteritis vs diarrheal illness (negative abdominal tenderness, or other GI symptoms) - Alkaline phosphatase, AST, ALT rising.  Most likely not secondary to antibiotics as they were discontinued 9/13.  Will continue to monitor along with GI - RUQ ultrasound showed gallbladder sludge see below - KUB on 12/29/2018 with mild diffuse gaseous prominence without obstructive pattern.  - Suspicion for possible mild shock liver given she was hypotensive on presentation.   - GI also believes that this transaminitis could reflect from skeletal muscle release as well - CK, MB minimally elevated -but improving - GI following, repeat CT on 01/06/2019 remarkable for ascites -9/18 discussed case with Dr. Justice Britain GI who spoken to daughter and plan is for liver Doppler ultrasound.  If abnormalities noted on ultrasound will most  likely proceed with  MRI/MRCP and possibly a liver biopsy. -9/22 Family not wanting an aggressive procedure performed (patient would not do well).   Major depressive disorder - Recent loss of sister 2 months ago with extreme ataxia and failure to thrive, daughter continues to think that this therapy is completely wrong. -Psychiatry consulted recommended initiation of antidepressant therapy.  Patient nor daughter interested in initiating treatment -although patient remains on Remeron  UTI/yeast infection in groin and oral thrush, resolving - Now completed antibiotic course - nystatin topical powder to groin - Nystatin swish and swallow 58m  5 x per day or Clotrimazole 5 times daily - ID following patient - Speech continues to follow along, patient tolerating p.o. quite poorly  Leukopenia - Resolved  Thrombocytopenia -Most likely secondary to patient's liver dysfunction.  Currently no signs or symptoms of occult bleed - Started on vitamin B12 parenterally as she has had low levels of vitamin B12 during last admission -No further work-up indicated, patient can follow-up in outpatient setting with indicated -9/21 continues to improve  AKI/hyponatremia/hypokalemia -Resolved -Monitoring  Refeeding syndrome, resolving - Now that patient receiving nutrition continues to have metabolic derangements - Resolving: Mg/Phos WNL  Sacral cubitus ulcer stage II - Continue care per wound care recommendations  Loose stools, in the setting of tube feeds - Afebrile , negative leukocytosis, negative abdominal pain.  No indication for C. difficile work-up -NOTE; previous history of C. difficile colitis back in 2018 in which she received a fecal transplant - Patient was covered by vancomycin per ID guidelines being treated for UTI as above - Resolving   Dehydration/hypoglycemia -Due to poor p.o. intake, anticipating D5 half-normal saline IV fluid resuscitation -Encouraging p.o. intake -Anticipating reinsertion of NG  tube and continue tube feeds--if p.o. intake does not improve    Ethics: - Patient now re-presenting after recent hospitalization, was home for roughly 1-2 days and returned with worsening weakness, severe dehydration, and adult failure to thrive.  Daughter believes that she just will require some nutrition through artificial means to get her back to her previous baseline. She states that her mother was independent with her ADLs and cooking for herself prior to admission (unclear timeframe).  Daughter states that her current decline was acute and only started on 12/28/2018; which is contrary to all nursing reports and previous provider reports during her care during this hospitalization (patient weighed 45kg over a month ago -admitted at 48 kg).  Daughter seems to be struggling with the notion that her mother is in the active dying process in regards to her adult failure to thrive in which she is refusing most if not all interventions and has not been eating or drinking now for weeks. - Given this patient's advanced age, repeat hospitalization in less than a few days from previous discharge, patient's prognosis is grim, and this has been relayed to the patient's daughter on multiple occasions by multiple providers. - Ethics has been consulted, following along, we appreciate insight recommendations on this difficult case  DVT prophylaxis: Lovenox/SCD Code Status: Full Family Communication: 01/18/19 Dr. LAvon Gullyhas updated patient's daughter apparently continues to request full care to be continued The phone number provided was called no answer yet.  Disposition Plan: TBD   Consultants:  ID GI Hematology Palliative care IR  Antimicrobials: Anti-infectives (From admission, onward)   Start     Stop   01/06/19 2200  cephALEXin (KEFLEX) 250 MG/5ML suspension 500 mg     01/08/19 2141   01/06/19 1045  ceFAZolin (ANCEF) IVPB 1 g/50 mL premix  Status:  Discontinued     01/06/19 1734   01/06/19  1000  vancomycin (VANCOCIN) 50 mg/mL oral solution 125 mg     01/08/19 1226   01/05/19 1200  cefTRIAXone (ROCEPHIN) 1 g in sodium chloride 0.9 % 100 mL IVPB  Status:  Discontinued     01/06/19 1037      LINES / TUBES:  Core track placed 9/8>>> accidentally removed 01/17/2019 Core track replaced 9/22>> ongoing  Continuous Infusions:  sodium chloride     Objective: Vitals:   01/20/19 1535 01/20/19 2142 01/21/19 0500 01/21/19 0538  BP: 128/75 126/82  (!) 151/83  Pulse: 81 91  94  Resp: _0 Temp: 98.5 F (36.9 C) 98.3 F (36.8 C)  97.9 F (36.6 C)  TempSrc: Oral Oral  Oral  SpO2: 97% 99%  100%  Weight:   60 kg   Height:        Intake/Output Summary (Last 24 hours) at 01/21/2019 1335 Last data filed at 01/21/2019 0530 Gross per 24 hour  Intake 587.47 ml  Output 1 ml  Net 586.47 ml   Filed Weights   01/04/19 0500 01/15/19 0500 01/21/19 0500  Weight: 61 kg 60 kg 60 kg   Physical Exam  BP (!) 151/83 (BP Location: Right Arm)    Pulse 94    Temp 97.9 F (36.6 C) (Oral)    Resp 18    Ht _1  (1.6 m)    Wt 60 kg    SpO2 100%    BMI 23.43 kg/m    Physical Exam  Constitution: Much more awake this morning, able to verbalize her name and her daughter's name. Psychiatric: More in tune with conversation, poor insight, poor judgment HEENT: Excessively dry lips/mouth normocephalic, PERRL, otherwise with in Normal limits  Chest:Chest symmetric Cardio vascular:  S1/S2, RRR, No murmure, No Rubs or Gallops  pulmonary: Clear to auscultation bilaterally, respirations unlabored, negative wheezes / crackles Abdomen: Soft, non-tender, non-distended, bowel sounds,no masses, no organomegaly Muscular skeletal:  Severe cachexia/muscle wasting -generalized weakness  Limited exam - in bed, able to move all 4 extremities,,  Neuro: Limited exam/ CNII-XII intact. , normal motor and sensation, reflexes intact  Extremities:  Severe muscle wasting in the lower extremities and upper extremities,  no pitting edema lower extremities, +2 pulses  Skin: Dry, warm to touch, negative for any Rashes, sacral decub Wounds: Sacral decubitus,  per nursing documentation   Data Reviewed  CBC: Recent Labs  Lab 01/15/19 0444 01/16/19 0349 01/17/19 0502  WBC 6.4 6.8 7.1  NEUTROABS 5.5 5.9 6.2  HGB 9.6* 9.5* 9.3*  HCT 26.6* 27.0* 28.1*  MCV 89.9 90.9 95.9  PLT 101* 109* 062*   Basic Metabolic Panel: Recent Labs  Lab 01/14/19 1506 01/15/19 0444 01/16/19 0349 01/17/19 0502 01/18/19 0455 01/21/19 0534  NA 136  --  135 138 138 139  K 4.2  --  4.0 4.1 3.6 3.6  CL 113*  --  111 114* 113* 113*  CO2 19*  --  18* 18* 19* 18*  GLUCOSE 103*  --  106* 102* 117* 72  BUN 24*  --  23 23 26* 25*  CREATININE 0.76  --  0.73 0.65 0.71 0.72  CALCIUM 8.1*  --  7.6* 7.6* 7.7* 7.8*  MG 1.9 2.3 2.0 1.9 1.9 1.9  PHOS 1.6* 3.8 3.6 2.7 2.9 3.4   GFR: Estimated Creatinine Clearance: 41.8 mL/min (by C-G formula based  on SCr of 0.72 mg/dL). Liver Function Tests: Recent Labs  Lab 01/16/19 0349 01/17/19 0502 01/18/19 0455 01/21/19 0534  AST 206* 253* 247* 110*  ALT 113* 143* 157* 102*  ALKPHOS 506* 490* 464* 315*  BILITOT 0.6 0.8 0.6 0.8  PROT 5.0* 5.5* 5.6* 5.6*  ALBUMIN 1.7* 1.8* 1.8* 2.1*   No results for input(s): LIPASE, AMYLASE in the last 168 hours. No results for input(s): AMMONIA in the last 168 hours. Coagulation Profile: Recent Labs  Lab 01/15/19 0444  INR 1.0   CBG: Recent Labs  Lab 01/18/19 0736 01/19/19 0633 01/20/19 0752 01/20/19 0846 01/21/19 0718  GLUCAP 107* 135* 57* 99 79  Urine analysis:    Component Value Date/Time   COLORURINE YELLOW 01/08/2019 2015   APPEARANCEUR HAZY (A) 01/08/2019 2015   LABSPEC 1.018 01/08/2019 2015   PHURINE 6.0 01/08/2019 2015   GLUCOSEU NEGATIVE 01/08/2019 2015   HGBUR SMALL (A) 01/08/2019 2015   BILIRUBINUR NEGATIVE 01/08/2019 2015   KETONESUR NEGATIVE 01/08/2019 2015   PROTEINUR NEGATIVE 01/08/2019 2015   UROBILINOGEN 0.2  Jun 04, 202014 1048   NITRITE NEGATIVE 01/08/2019 2015   LEUKOCYTESUR NEGATIVE 01/08/2019 2015   No results found for this or any previous visit (from the past 240 hour(s)).   Radiology Studies: No results found. Scheduled Meds:  aspirin EC  81 mg Oral Daily   chlorhexidine  15 mL Mouth Rinse BID   collagenase   Topical Daily   enoxaparin (LOVENOX) injection  40 mg Subcutaneous Q24H   Gerhardt's butt cream   Topical TID   latanoprost  1 drop Both Eyes QHS   mouth rinse  15 mL Mouth Rinse q12n4p   megestrol  400 mg Per Tube Daily   megestrol  400 mg Oral TID   mirtazapine  30 mg Oral QHS   multivitamin with minerals  1 tablet Oral Daily   nystatin  5 mL Oral 5 X Daily   nystatin   Topical BID   pantoprazole  40 mg Oral Daily   sodium chloride flush  3 mL Intravenous Q12H   Continuous Infusions:  sodium chloride       LOS: 32 days   The patient is critically ill with multiple organ systems failure and requires high complexity decision making for assessment and support, frequent evaluation and titration of therapies, application of advanced monitoring technologies and extensive interpretation of multiple databases. Critical Care Time devoted to patient care services described in this note  Time spent: 40 minutes   Deatra James, MD., [facility].  [facility] Pager [phone]  If 7PM-7AM, please contact night-coverage [email or web address] Password [email or web address] 01/21/2019, 1:35 PM

## 2019-01-21 NOTE — Plan of Care (Signed)
  Problem: Education: ?Goal: Knowledge of General Education information will improve ?Description: Including pain rating scale, medication(s)/side effects and non-pharmacologic comfort measures ?Outcome: Progressing ?  ?Problem: Health Behavior/Discharge Planning: ?Goal: Ability to manage health-related needs will improve ?Outcome: Progressing ?  ?Problem: Clinical Measurements: ?Goal: Ability to maintain clinical measurements within normal limits will improve ?Outcome: Progressing ?Goal: Will remain free from infection ?Outcome: Progressing ?Goal: Diagnostic test results will improve ?Outcome: Progressing ?Goal: Respiratory complications will improve ?Outcome: Progressing ?Goal: Cardiovascular complication will be avoided ?Outcome: Progressing ?  ?Problem: Coping: ?Goal: Level of anxiety will decrease ?Outcome: Progressing ?  ?Problem: Elimination: ?Goal: Will not experience complications related to bowel motility ?Outcome: Progressing ?Goal: Will not experience complications related to urinary retention ?Outcome: Progressing ?  ?Problem: Pain Managment: ?Goal: General experience of comfort will improve ?Outcome: Progressing ?  ?

## 2019-01-21 NOTE — Progress Notes (Signed)
Nutrition Follow-up  RD working remotely.  DOCUMENTATION CODES:   Severe malnutrition in context of chronic illness  INTERVENTION:   -Initiate 48 hour calorie count per MD; RD will follow-up on Monday, 01/21/19 for results -Ensure Enlive po TID, each supplement provides 350 kcal and 20 grams of protein -30 ml Prostat BID, each supplement provides 100 kcals and 15 grams protein  NUTRITION DIAGNOSIS:   Severe Malnutrition related to chronic illness(TIA) as evidenced by energy intake < or equal to 75% for > or equal to 1 month, moderate fat depletion, severe fat depletion, moderate muscle depletion, severe muscle depletion.  Ongoing  GOAL:   Patient will meet greater than or equal to 90% of their needs  Progressing   MONITOR:   PO intake, Supplement acceptance, Diet advancement, Labs, Weight trends, TF tolerance, Skin, I & O's  REASON FOR ASSESSMENT:   Consult Assessment of nutrition requirement/status, Calorie Count  ASSESSMENT:   Sydney [name] is a 85 y.o. 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  Patient is accompanied by her daughter who assists with the history.  Patient has history of recurrent C. difficile colitis, has continued to have loose stools, though this has improved to 4 or 5/day, has had progressively worsening appetite, eating only 3 or 4 bites and unable to even drink half of a nutritional supplement drink.  Patient denies any abdominal pain since the recent hospitalization, no fevers or chills have been noted, and she denies any dysuria though she may have complained of this a few days ago.  She was requiring assistance with ambulation and had home health PT set up at recent discharge, but has  become even more weak in general since leaving the hospital and had a near syncopal episode today when she tried to get up from a bedside commode.  8/27- megace initiated, SLP recommend D3 diet with thin liquids 8/29- nystatin added for oral thrush 9/4- pt daughter agreeable to TF 9/5- NGT placement unsuccessful x 2 (RN bedside placement) 9/7- cortrak tube placed; tip of tube confirmed in stomach 9/18- per palliative care notes, pt daughter continues to desire aggressive care  9/22- cortrak tube displaced, reinserted by IR 9/24- per IR, pt not a candidate for PEG  9/25- cortrak removed, per MD no plan to replace currently  Case discussed with SLP yesterday, who reports service has signed off. Pt was refusing to eat or drink for SLP.   IR evaluated pt for potential placement of g-tube. Pt is not a candidate for PEG, secondary to presence of ascites of CT of abdomen and pelvis, which puts pt at risk of persistent ascitic leak.   Pt pulled out cortak tube again on 01/20/19 and daughter and MD have agreed not to replace it.   Pt historically has waxing and waning mentation during hospitalization. Today, pt is more alert and accepting of PO intake. Pt has requested calorie count. No meal completion has been documented since 01/18/19.   Medications reviewed and include MVI and megace.   Labs reviewed: K, Mg, and Phos WDL. CBGS: 57-135.   Diet Order:   Diet Order            DIET DYS 3 Room service appropriate? Yes; Fluid consistency: Thin  Diet effective now  EDUCATION NEEDS:   Not appropriate for education at this time  Skin:  Skin Assessment: Skin Integrity Issues: Skin Integrity Issues:: Stage II, Unstageable Stage II: sacrum Unstageable: coccyx  Last BM:  01/21/19  Height:   Ht Readings from Last 1 Encounters:  12/20/18 5\' 3"  (1.6 m)    Weight:   Wt Readings from Last 1 Encounters:  01/21/19 60 kg    Ideal Body Weight:  52.3 kg  BMI:  Body mass index  is 23.43 kg/m.  Estimated Nutritional Needs:   Kcal:  1250-1450  Protein:  60-75 grams  Fluid:  > 1.2 L    Keandre Linden A. Jimmye Norman, RD, LDN, [place] Registered Dietitian II Certified Diabetes Care and Education Specialist Pager: [phone] After hours Pager: [phone]

## 2019-01-22 DIAGNOSIS — R627 Adult failure to thrive: Secondary | ICD-10-CM | POA: Diagnosis not present

## 2019-01-22 LAB — GLUCOSE, CAPILLARY
Glucose-Capillary: 40 mg/dL — CL (ref 70–99)
Glucose-Capillary: 40 mg/dL — CL (ref 70–99)
Glucose-Capillary: 51 mg/dL — ABNORMAL LOW (ref 70–99)
Glucose-Capillary: 90 mg/dL (ref 70–99)
Glucose-Capillary: 94 mg/dL (ref 70–99)
Glucose-Capillary: 64 mg/dL — ABNORMAL LOW (ref 70–99)
Glucose-Capillary: 81 mg/dL (ref 70–99)

## 2019-01-22 MED ORDER — DEXTROSE 50 % IV SOLN
INTRAVENOUS | Status: AC
  Administered 2019-01-22: 25 mL
  Filled 2019-01-22: qty 50

## 2019-01-22 NOTE — Progress Notes (Signed)
Hypoglycemic Event  CBG: 40 @ 0740  Treatment: 8 oz juice/soda  Symptoms: Shaky  Follow-up CBG: Time: [id number] CBG Result: 40  Treatment: 4 oz juice - this is all pt would take  Follow-up CBG: 51 @ 0915  Treatment: 33ml D50  Follow-up CBG: 90 @ [id number]  Possible Reasons for Event: Inadequate meal intake  Comments/MD notified: MD notified     Sydney [name]

## 2019-01-22 NOTE — Progress Notes (Signed)
PROGRESS NOTE    MARY-ANNE FENDT  [id number] DOB: [birth year] DOA: 12/19/2018 PCP: Yetta Flock, MD      Brief Narrative:  Mrs. Sisler is a 85 y.o. F with recurrent Cdiff s/p stool transplant, TIA, HTN who presented with persistent weakness, dehydration.  History begins June with ring fatigue, improved with oral intake.  By July INR, the patient had progressively less and less energy and so she was extremely weak, her daughter had to cook for her, and even at times had to assist her with ambulation.  She was seen in the ER once with low blood pressure and hyponatremia, improved somewhat with fluids.  Around the time the sacral wound was noted.  Also at that time, her LFTs were noted.  She  Shortly after that she was admitted to the hospital for 2 days for similar symptoms, attributed to urinary tract infection.  After discharge from that stay, she returned with worsening LFTs, leukopenia.     Assessment & Plan:  Transaminitis Present during last hospitalization prior to discharge with Keflex.  Increased here, peaked at 433 at 9/2, now improving. Unclear cause.  GI were consulted again, suspect DILI from Keflex, although the timing does not suggest it.  Biopsy has been deferred. Viral hepatitis, EBV and CMV serologies negative. -Trend LFTs   Acute metabolic encephalopathy Patient remains only able to speak 1-2 words.  Hypoglycemia Recurrent -Start D5 infusion  Failure to thrive -Continue Ensure, Pro-stat -Continue megace -Continue mirtazapine  Hypertension Cerebrovascular disease secondary prevention -Continue aspirin -Hold Plavix  Stage II pressure injury, coccyx POA  Anemia of chronic disease Stable  Leukopenia, resolved  Thrombocytopenia   Other medications -Hold PPI  Fever and leukocytosis 9/10 responded to vancomycin and cephalexin    MDM and disposition: The below labs and imaging reports were reviewed and summarized above.  Medication  management as above.  The patient was admitted with fatigue, weakness, transaminitis.         DVT prophylaxis: Lovenox Code Status: FULL Family Communication: Daughter    Consultants:   GI  Palliative Care  Procedures:   8/20 cT head  8/25 CT abdomen  8/26 MRI brain  8/28 US abdomen limitted  9/11 CT abdomen  9/18 US liver  Antimicrobials:   Vancomycin 9/10 >> 9/12  Cephalexin 9/10 >> 9/12   Culture data:   Urine culture 8/23 -- insignificant growth  Blood culture 8.24 x2 -- NG     Subjective: Feels tire.d  Able to take some PO now, but only certain foods.  More alert, oriented, but still with psychomotor slowing.  Objective: Vitals:   01/21/19 1459 01/21/19 2001 01/22/19 0529 01/22/19 1348  BP: 137/75 132/80 (!) 143/87 136/82  Pulse: 80 86 83 82  Resp:   18 18  Temp: 98.2 F (36.8 C) 98.3 F (36.8 C) 98.5 F (36.9 C) 97.7 F (36.5 C)  TempSrc: Oral Oral Oral Oral  SpO2: (!) 89% 98% 99% 98%  Weight:      Height:       No intake or output data in the 24 hours ending 01/22/19 1621 Filed Weights   01/04/19 0500 01/15/19 0500 01/21/19 0500  Weight: 61 kg 60 kg 60 kg    Examination: General appearance: thin frail adult female, awake but slowed responses distress.   HEENT: Anicteric, conjunctiva pink, lids and lashes normal. No nasal deformity, discharge, epistaxis.  Lips moist, OP dry, dentures in place, no OP lesions, can't see posterior pharynx, hearing seems dimimished.  Skin: Warm and dry.  No jaundice.  No suspicious rashes or lesions. Cardiac: RRR, nl S1-S2, no murmurs appreciated.  Capillary refill is brisk.  JVP not visible.  No LE edema.  Radial pulses 2+ and symmetric. Respiratory: Normal respiratory rate and rhythm.  CTAB without rales or wheezes. Abdomen: Abdomen soft.  Mild LLQ TTP without gaurding, no RUQ tenderness. No ascites, distension, hepatosplenomegaly.   MSK: No deformities or effusions.  Severe diffuse loss of  subcutaneous muscle mass and fat, temporal wasting. Neuro: Awake, responsive slowed.  Does not make eye contact.  EOMI, moves upper extremities, very weakly, but symmetrically, unable to lift legs against gravity.  Speech fluent.     Psych: Sensorium intact and responding to questions, oriented to self, hospital, [facility], daughter .attention diminished, affect blunted judgment and insight appear impaired.    Data Reviewed: I have personally reviewed following labs and imaging studies:  CBC: Recent Labs  Lab 01/16/19 0349 01/17/19 0502  WBC 6.8 7.1  NEUTROABS 5.9 6.2  HGB 9.5* 9.3*  HCT 27.0* 28.1*  MCV 90.9 95.9  PLT 109* 0000000*   Basic Metabolic Panel: Recent Labs  Lab 01/16/19 0349 01/17/19 0502 01/18/19 0455 01/21/19 0534  NA 135 138 138 139  K 4.0 4.1 3.6 3.6  CL 111 114* 113* 113*  CO2 18* 18* 19* 18*  GLUCOSE 106* 102* 117* 72  BUN 23 23 26* 25*  CREATININE 0.73 0.65 0.71 0.72  CALCIUM 7.6* 7.6* 7.7* 7.8*  MG 2.0 1.9 1.9 1.9  PHOS 3.6 2.7 2.9 3.4   GFR: Estimated Creatinine Clearance: 41.8 mL/min (by C-G formula based on SCr of 0.72 mg/dL). Liver Function Tests: Recent Labs  Lab 01/16/19 0349 01/17/19 0502 01/18/19 0455 01/21/19 0534  AST 206* 253* 247* 110*  ALT 113* 143* 157* 102*  ALKPHOS 506* 490* 464* 315*  BILITOT 0.6 0.8 0.6 0.8  PROT 5.0* 5.5* 5.6* 5.6*  ALBUMIN 1.7* 1.8* 1.8* 2.1*   No results for input(s): LIPASE, AMYLASE in the last 168 hours. No results for input(s): AMMONIA in the last 168 hours. Coagulation Profile: No results for input(s): INR, PROTIME in the last 168 hours. Cardiac Enzymes: No results for input(s): CKTOTAL, CKMB, CKMBINDEX, TROPONINI in the last 168 hours. BNP (last 3 results) No results for input(s): PROBNP in the last 8760 hours. HbA1C: No results for input(s): HGBA1C in the last 72 hours. CBG: Recent Labs  Lab 01/22/19 0740 01/22/19 0835 01/22/19 0915 01/22/19 0951 01/22/19 1127  GLUCAP 40* 40* 51* 90  94   Lipid Profile: No results for input(s): CHOL, HDL, LDLCALC, TRIG, CHOLHDL, LDLDIRECT in the last 72 hours. Thyroid Function Tests: No results for input(s): TSH, T4TOTAL, FREET4, T3FREE, THYROIDAB in the last 72 hours. Anemia Panel: No results for input(s): VITAMINB12, FOLATE, FERRITIN, TIBC, IRON, RETICCTPCT in the last 72 hours. Urine analysis:    Component Value Date/Time   COLORURINE YELLOW 01/08/2019 2015   APPEARANCEUR HAZY (A) 01/08/2019 2015   LABSPEC 1.018 01/08/2019 2015   PHURINE 6.0 01/08/2019 2015   GLUCOSEU NEGATIVE 01/08/2019 2015   HGBUR SMALL (A) 01/08/2019 2015   BILIRUBINUR NEGATIVE 01/08/2019 2015   KETONESUR NEGATIVE 01/08/2019 2015   PROTEINUR NEGATIVE 01/08/2019 2015   UROBILINOGEN 0.2 08/26/202014 1048   NITRITE NEGATIVE 01/08/2019 2015   LEUKOCYTESUR NEGATIVE 01/08/2019 2015   Sepsis Labs: @LABRCNTIP (procalcitonin:4,lacticacidven:4)  )No results found for this or any previous visit (from the past 240 hour(s)).       Radiology Studies: No results found.  Scheduled Meds: . aspirin EC  81 mg Oral Daily  . chlorhexidine  15 mL Mouth Rinse BID  . collagenase   Topical Daily  . enoxaparin (LOVENOX) injection  40 mg Subcutaneous Q24H  . feeding supplement (ENSURE ENLIVE)  237 mL Oral TID BM  . feeding supplement (PRO-STAT SUGAR FREE 64)  30 mL Oral BID WC  . Gerhardt's butt cream   Topical TID  . latanoprost  1 drop Both Eyes QHS  . mouth rinse  15 mL Mouth Rinse q12n4p  . megestrol  400 mg Oral TID  . mirtazapine  30 mg Oral QHS  . multivitamin with minerals  1 tablet Oral Daily  . nystatin  5 mL Oral 5 X Daily  . nystatin   Topical BID  . pantoprazole  40 mg Oral Daily  . sodium chloride flush  3 mL Intravenous Q12H   Continuous Infusions: . sodium chloride       LOS: 33 days    Time spent: 35 minutes    Edwin Dada, MD [facility] 01/22/2019, 4:21 PM     Please page through [place]:  [email or web address]  Password [email or web address] If 7PM-7AM, please contact night-coverage

## 2019-01-23 ENCOUNTER — Inpatient Hospital Stay (HOSPITAL_COMMUNITY): Payer: Medicare Other

## 2019-01-23 DIAGNOSIS — R627 Adult failure to thrive: Secondary | ICD-10-CM | POA: Diagnosis not present

## 2019-01-23 LAB — GLUCOSE, CAPILLARY
Glucose-Capillary: 79 mg/dL (ref 70–99)
Glucose-Capillary: 76 mg/dL (ref 70–99)

## 2019-01-23 MED ORDER — WHITE PETROLATUM EX OINT
TOPICAL_OINTMENT | CUTANEOUS | Status: AC
  Filled 2019-01-23: qty 28.35

## 2019-01-23 MED ORDER — BOOST / RESOURCE BREEZE PO LIQD CUSTOM
1.0000 | Freq: Three times a day (TID) | ORAL | Status: DC
  Administered 2019-01-23 – 2019-01-24 (×3): 1 via ORAL

## 2019-01-23 MED ORDER — GUAIFENESIN-DM 100-10 MG/5ML PO SYRP
5.0000 mL | ORAL_SOLUTION | ORAL | Status: DC | PRN
  Administered 2019-01-23 – 2019-01-26 (×3): 5 mL via ORAL
  Filled 2019-01-23 (×5): qty 5

## 2019-01-23 NOTE — Care Management Important Message (Signed)
Important Message  Patient Details  Name: Sydney [name] MRN: [id number] Date of Birth: [birth year]   Medicare Important Message Given:  Yes     [name] 01/23/2019, 3:00 PM

## 2019-01-23 NOTE — Progress Notes (Signed)
PROGRESS NOTE    ROSEALEE RECINOS  FQH:[id number] DOB: [birth year] DOA: 12/19/2018 PCP: [name] Primary GI: Dr. Carlean Purl      Recent history:  Mrs. Farro is a 85 y.o. F with recurrent Cdiff s/p stool transplant, TIA, HTN who presented with persistent, progressive weakness, dehydration for 1 month.   History primarily collected from daughter: prior to June, patient lived independently, managed finances/meds, active in church, walked a few times per week, briskly.  In June, started to complain to daughter of morning fatigue without other complaints (no fever, no abd pain, no joint pains).  Presented to PCP, who recommended she eat more for breakfast (or eat it earlier), which helped for a while.  By July/August, however, symptoms had progressed. Patient had progressively less and less energy and so she was weak all the time, her daughter had to take over most IADLs, including cooking, housework, and even at times had to assist her with ambulation.    This culminated around 8/18 when was evaluated by GI for rectal bleeding (from hemorrhoids), then presented to the ER for dehydration and noted to have new transaminitis.  She was thought to be dehydrated, improved with fluids, was discharged only to return twice more that week with dehydration/fatigue, finally requiring (and agreeing to) admission for dehydration and UTI from 8/21 to 8/23.    After discharge, she got weaker and had minimal intake, until 8/24 when she passed out on the commode, came back to the ER, and was admitted this admission.    Initially, here, she was noted to have leukopenia, transaminitis but no fever or focal signs of infection on exam.  GI were consulted for transaminitis and ID were consulted for the appearance of colitis on CT.    See also versions of history collected from Dr. Tamala Julian 8/21, Dr. Fuller Plan 8/25, Dr. Lindi Adie 8/27.        Assessment & Plan:  Generalized weakness due to severe protein calorie  malnutrition and weight loss due to anorexia/decreased oral intake This was subacute, starting in June or July.  It started as reduced oral intake, ultimately leading to severe weight loss and now failure to thrive.  It is unclear whether thrush contributed to this, although thrush is no longer easily visible on exam.  Cross-sectional imaging has revealed no cancer, endoscopy has been deferred.  There has been a question of psychiatric etiology, given the timing of the patient's sister's death shortly before this all started in May. -Nutrition consult -Consult GI, appreciate cares  -Continue Ensure, Pro-stat -Continue megace -Continue mirtazapine    Odynophagia -Continue Nystatin swish and swallow to the extent able (patient mostly refuses this)  Transaminitis Cholestasis Initially had a transaminitis, ~3x ULN with AST predominance peaking around 400 on 9/2, after which had a notable rise in Alk Phos, which has also resolved.  Specific viral etiologies that have been ruled out include viral hepatitides, CMV, EBV.  Autoimmune serologies have been negative.    Percutaneous biopsy is not possible due to her ascites (due to hypoalbuminemia)  Please see assessments by Dr. Rush Landmark and Dr. Havery Moros on 9/15 and 9/12 -Consult GI -Trend LFTs -Revisit MRCP?  Would this add to picture now that she is clinically improving?  Goals of Care Please review note by Dr. Hilma Favors on 9/18 after interdisciplinary meeting.  Acute metabolic encephalopathy  Hypertension Cerebrovascular disease secondary prevention -Continue aspirin -Hold Plavix  Stage II pressure injury, coccyx POA  Anemia of chronic disease Leukopenia, resolved Thrombocytopenia Hematology were consulted.  Other medications -Hold PPI  Cough New cough. -Cehck CBC -Check CXR         MDM and disposition: The below labs and imaging reports reviewed and summarized above.  Medication management as above.  The  patient was admitted with fatigue, weakness, transaminitis.         DVT prophylaxis: Lovenox Code Status: FULL Family Communication: Daughter    Consultants:   GI  ID  Hematology  Psychiatry  Palliative Care  Procedures:   8/20 cT head  8/25 CT abdomen  8/26 MRI brain  8/28 US abdomen limitted  9/11 CT abdomen  9/18 US liver  Antimicrobials:   PO Vancomycin 9/10 >> 9/12  Cephalexin 9/10 >> 9/12   Culture data:   Urine culture 8/23 -- insignificant growth  Blood culture 8.24 x2 -- NG     Subjective: New cough, no fever.  Mentation improved.  Oral intake very minimal.  No vomiting.      Objective: Vitals:   01/22/19 2000 01/23/19 0546 01/23/19 0951 01/23/19 1425  BP: (!) 143/82 (!) 143/87 139/73 (!) 149/72  Pulse: 82 92 82 88  Resp: 15 16 (!) 22 20  Temp:  98.5 F (36.9 C) 98.2 F (36.8 C) (!) 97.5 F (36.4 C)  TempSrc:  Oral Axillary Axillary  SpO2: 98% 99% 96% 98%  Weight:      Height:        Intake/Output Summary (Last 24 hours) at 01/23/2019 1636 Last data filed at 01/23/2019 2355 Gross per 24 hour  Intake 245 ml  Output 400 ml  Net -155 ml   Filed Weights   01/04/19 0500 01/15/19 0500 01/21/19 0500  Weight: 61 kg 60 kg 60 kg    Examination: General appearance: Frail elderly female, lying in bed, staring ahead of her, answers questions when her daughter asks them now when I ask them, makes eye contact when I approach the bed. HEENT: Anicteric, conjunctival pink, lids and lashes normal.  No nasal deformity, discharge, or epistaxis.  Lips moist today, oropharynx normal, dentures in place, I do not appreciate plaques in the throat. Skin: Skin warm and dry, no suspicious rashes on the face, neck, upper chest, arms, or legs. Cardiac: Tachycardic, regular, no murmurs, JVP not visible, no lower extremity edema. Respiratory: Respiratory effort normal, rate somewhat increased, coughing frequently, I do not appreciate rales  bilaterally, but her air entry is very diminished posterior fields. Abdomen: Soft, no focal tenderness to palpation that I can appreciate, although she is guarding everywhere, no distention or ascites. MSK: Severe diffuse loss of subcutaneous muscle mass in the back.. Neuro: Awake and responsive, psychomotor slowing, sometimes makes eye contact, sometimes answers questions, answers questions easily for her daughter, possibly hard of hearing.  Speech fluent. Psych: Psychomotor slowing is noted.  She is oriented place, but her attention is distracted, affect blunted.    Data Reviewed: I have personally reviewed following labs and imaging studies:  CBC: Recent Labs  Lab 01/17/19 0502  WBC 7.1  NEUTROABS 6.2  HGB 9.3*  HCT 28.1*  MCV 95.9  PLT 732*   Basic Metabolic Panel: Recent Labs  Lab 01/17/19 0502 01/18/19 0455 01/21/19 0534  NA 138 138 139  K 4.1 3.6 3.6  CL 114* 113* 113*  CO2 18* 19* 18*  GLUCOSE 102* 117* 72  BUN 23 26* 25*  CREATININE 0.65 0.71 0.72  CALCIUM 7.6* 7.7* 7.8*  MG 1.9 1.9 1.9  PHOS 2.7 2.9 3.4   GFR: Estimated Creatinine  Clearance: 41.8 mL/min (by C-G formula based on SCr of 0.72 mg/dL). Liver Function Tests: Recent Labs  Lab 01/17/19 0502 01/18/19 0455 01/21/19 0534  AST 253* 247* 110*  ALT 143* 157* 102*  ALKPHOS 490* 464* 315*  BILITOT 0.8 0.6 0.8  PROT 5.5* 5.6* 5.6*  ALBUMIN 1.8* 1.8* 2.1*   No results for input(s): LIPASE, AMYLASE in the last 168 hours. No results for input(s): AMMONIA in the last 168 hours. Coagulation Profile: No results for input(s): INR, PROTIME in the last 168 hours. Cardiac Enzymes: No results for input(s): CKTOTAL, CKMB, CKMBINDEX, TROPONINI in the last 168 hours. BNP (last 3 results) No results for input(s): PROBNP in the last 8760 hours. HbA1C: No results for input(s): HGBA1C in the last 72 hours. CBG: Recent Labs  Lab 01/22/19 1127 01/22/19 1846 01/22/19 1921 01/23/19 0542 01/23/19 0742  GLUCAP  94 64* 81 79 76   Lipid Profile: No results for input(s): CHOL, HDL, LDLCALC, TRIG, CHOLHDL, LDLDIRECT in the last 72 hours. Thyroid Function Tests: No results for input(s): TSH, T4TOTAL, FREET4, T3FREE, THYROIDAB in the last 72 hours. Anemia Panel: No results for input(s): VITAMINB12, FOLATE, FERRITIN, TIBC, IRON, RETICCTPCT in the last 72 hours. Urine analysis:    Component Value Date/Time   COLORURINE YELLOW 01/08/2019 2015   APPEARANCEUR HAZY (A) 01/08/2019 2015   LABSPEC 1.018 01/08/2019 2015   PHURINE 6.0 01/08/2019 2015   GLUCOSEU NEGATIVE 01/08/2019 2015   HGBUR SMALL (A) 01/08/2019 2015   BILIRUBINUR NEGATIVE 01/08/2019 2015   KETONESUR NEGATIVE 01/08/2019 2015   PROTEINUR NEGATIVE 01/08/2019 2015   UROBILINOGEN 0.2 June 01, 202014 1048   NITRITE NEGATIVE 01/08/2019 2015   LEUKOCYTESUR NEGATIVE 01/08/2019 2015   Sepsis Labs: '@LABRCNTIP' (procalcitonin:4,lacticacidven:4)  )No results found for this or any previous visit (from the past 240 hour(s)).       Radiology Studies: No results found.      Scheduled Meds: . aspirin EC  81 mg Oral Daily  . chlorhexidine  15 mL Mouth Rinse BID  . collagenase   Topical Daily  . enoxaparin (LOVENOX) injection  40 mg Subcutaneous Q24H  . feeding supplement  1 Container Oral TID BM  . feeding supplement (PRO-STAT SUGAR FREE 64)  30 mL Oral BID WC  . Gerhardt's butt cream   Topical TID  . latanoprost  1 drop Both Eyes QHS  . mouth rinse  15 mL Mouth Rinse q12n4p  . megestrol  400 mg Oral TID  . mirtazapine  30 mg Oral QHS  . multivitamin with minerals  1 tablet Oral Daily  . nystatin  5 mL Oral 5 X Daily  . nystatin   Topical BID  . pantoprazole  40 mg Oral Daily  . sodium chloride flush  3 mL Intravenous Q12H  . white petrolatum       Continuous Infusions: . sodium chloride       LOS: 34 days    Time spent: 25 minutes    Edwin Dada, MD [facility] 01/23/2019, 4:36 PM     Please page through  [place]:  [email or web address] Password [email or web address] If 7PM-7AM, please contact night-coverage

## 2019-01-23 NOTE — Progress Notes (Signed)
Calorie Count Note  Spoke with daughter. She had just ordered lunch to be delivered at 2pm.  Per daughter there is no plan to replace cortrak or for a g-tube. She reports intake and states this is a big improvement, before she had ate nothing. She states that ensure causes diarrhea.  Discussed tips on increasing kcal/protein after d/c.   - D/C ensure - Boost Breeze po TID, each supplement provides 250 kcal and 9 grams of protein - Continue 30 ml Prostat TID  48 hour calorie count ordered.  Diet: Dysphagia III with Thin liquids  Supplements: Ensure Enlive po BID, each supplement provides 350 kcal and 20 grams of protein   Breakfast: 1 spoonful of oatmeal, 1 spoonful of eggs Supplements: 1/2 boost breeze   Total intake: insignificant   [place], LDN, CNSC [phone] Pager [phone] After Hours Pager

## 2019-01-24 DIAGNOSIS — R627 Adult failure to thrive: Secondary | ICD-10-CM | POA: Diagnosis not present

## 2019-01-24 LAB — HEPATIC FUNCTION PANEL
ALT: 59 U/L — ABNORMAL HIGH (ref 0–44)
AST: 69 U/L — ABNORMAL HIGH (ref 15–41)
Albumin: 1.9 g/dL — ABNORMAL LOW (ref 3.5–5.0)
Alkaline Phosphatase: 211 U/L — ABNORMAL HIGH (ref 38–126)
Bilirubin, Direct: 0.4 mg/dL — ABNORMAL HIGH (ref 0.0–0.2)
Indirect Bilirubin: 0.9 mg/dL (ref 0.3–0.9)
Total Bilirubin: 1.3 mg/dL — ABNORMAL HIGH (ref 0.3–1.2)
Total Protein: 5.5 g/dL — ABNORMAL LOW (ref 6.5–8.1)

## 2019-01-24 LAB — CBC WITH DIFFERENTIAL/PLATELET
Abs Immature Granulocytes: 0.06 10*3/uL (ref 0.00–0.07)
Basophils Absolute: 0 10*3/uL (ref 0.0–0.1)
Basophils Relative: 0 %
Eosinophils Absolute: 0 10*3/uL (ref 0.0–0.5)
Eosinophils Relative: 0 %
HCT: 28.5 % — ABNORMAL LOW (ref 36.0–46.0)
Hemoglobin: 9.2 g/dL — ABNORMAL LOW (ref 12.0–15.0)
Immature Granulocytes: 1 %
Lymphocytes Relative: 7 %
Lymphs Abs: 0.3 10*3/uL — ABNORMAL LOW (ref 0.7–4.0)
MCH: 31.9 pg (ref 26.0–34.0)
MCHC: 32.3 g/dL (ref 30.0–36.0)
MCV: 99 fL (ref 80.0–100.0)
Monocytes Absolute: 0.5 10*3/uL (ref 0.1–1.0)
Monocytes Relative: 11 %
Neutro Abs: 3.6 10*3/uL (ref 1.7–7.7)
Neutrophils Relative %: 81 %
Platelets: 217 10*3/uL (ref 150–400)
RBC: 2.88 MIL/uL — ABNORMAL LOW (ref 3.87–5.11)
RDW: 19.1 % — ABNORMAL HIGH (ref 11.5–15.5)
WBC: 4.5 10*3/uL (ref 4.0–10.5)
nRBC: 0.4 % — ABNORMAL HIGH (ref 0.0–0.2)

## 2019-01-24 LAB — BASIC METABOLIC PANEL
Anion gap: 10 (ref 5–15)
BUN: 22 mg/dL (ref 8–23)
CO2: 17 mmol/L — ABNORMAL LOW (ref 22–32)
Calcium: 7.7 mg/dL — ABNORMAL LOW (ref 8.9–10.3)
Chloride: 113 mmol/L — ABNORMAL HIGH (ref 98–111)
Creatinine, Ser: 0.68 mg/dL (ref 0.44–1.00)
GFR calc Af Amer: >60 mL/min (ref >60)
GFR calc non Af Amer: >60 mL/min (ref >60)
Glucose, Bld: 72 mg/dL (ref 70–99)
Potassium: 3.1 mmol/L — ABNORMAL LOW (ref 3.5–5.1)
Sodium: 140 mmol/L (ref 135–145)

## 2019-01-24 LAB — GLUCOSE, CAPILLARY
Glucose-Capillary: 66 mg/dL — ABNORMAL LOW (ref 70–99)
Glucose-Capillary: 119 mg/dL — ABNORMAL HIGH (ref 70–99)

## 2019-01-24 MED ORDER — BOOST / RESOURCE BREEZE PO LIQD CUSTOM
1.0000 | Freq: Three times a day (TID) | ORAL | Status: DC
  Administered 2019-01-24 – 2019-01-28 (×8): 1 via ORAL
  Administered 2019-01-28: 10:00:00 via ORAL
  Administered 2019-01-29 – 2019-01-30 (×3): 1 via ORAL

## 2019-01-24 MED ORDER — DEXTROSE 50 % IV SOLN
INTRAVENOUS | Status: AC
  Filled 2019-01-24: qty 50

## 2019-01-24 MED ORDER — HYPROMELLOSE (GONIOSCOPIC) 2.5 % OP SOLN
1.0000 [drp] | Freq: Three times a day (TID) | OPHTHALMIC | Status: DC | PRN
  Filled 2019-01-24: qty 15

## 2019-01-24 MED ORDER — DEXTROSE 50 % IV SOLN
12.5000 g | Freq: Once | INTRAVENOUS | Status: AC
  Administered 2019-01-24: 12.5 g via INTRAVENOUS

## 2019-01-24 MED ORDER — POTASSIUM CHLORIDE 10 MEQ/100ML IV SOLN
10.0000 meq | INTRAVENOUS | Status: AC
  Administered 2019-01-24 (×4): 10 meq via INTRAVENOUS
  Filled 2019-01-24 (×4): qty 100

## 2019-01-24 MED ORDER — DEXTROSE 5 % IV SOLN
INTRAVENOUS | Status: AC
  Administered 2019-01-24: 17:00:00 via INTRAVENOUS

## 2019-01-24 NOTE — Plan of Care (Signed)
  Problem: Education: Goal: Knowledge of General Education information will improve Description: Including pain rating scale, medication(s)/side effects and non-pharmacologic comfort measures Outcome: Progressing   Problem: Health Behavior/Discharge Planning: Goal: Ability to manage health-related needs will improve Outcome: Progressing   Problem: Clinical Measurements: Goal: Ability to maintain clinical measurements within normal limits will improve Outcome: Progressing Goal: Will remain free from infection Outcome: Progressing   Problem: Elimination: Goal: Will not experience complications related to bowel motility Outcome: Progressing Goal: Will not experience complications related to urinary retention Outcome: Progressing

## 2019-01-24 NOTE — Progress Notes (Signed)
PROGRESS NOTE    JULISA FLIPPO  OQH:[id number] DOB: Jan 14, 1931 DOA: 12/19/2018 PCP: [name] Primary GI: Dr. Carlean Purl      Recent history:  Mrs. Thiem is a 85 y.o. F with recurrent Cdiff s/p stool transplant, TIA, HTN who presented with persistent, progressive weakness, dehydration for 1 month.  History primarily collected from daughter: prior to June, patient lived independently, managed finances/meds, active in church, walked a few times per week, briskly.  In June, started to complain to daughter of morning fatigue without other complaints (no fever, no abd pain, no joint pains).  Presented to PCP, who recommended she eat more for breakfast (or eat it earlier), which helped for a while.  By July/August, however, symptoms had progressed. Patient had progressively less and less energy and so she was weak all the time, her daughter had to take over most IADLs, including cooking, housework, and even at times had to assist her with ambulation.    This culminated around 8/18 when was evaluated by GI for rectal bleeding (from hemorrhoids), then presented to the ER for dehydration and noted to have new transaminitis.  She was thought to be dehydrated, improved with fluids, was discharged only to return twice more that week with dehydration/fatigue, finally requiring (and agreeing to) admission for dehydration and UTI from 8/21 to 8/23.    After discharge, she got weaker and had minimal intake, until 8/24 when she passed out on the commode, came back to the ER, and was admitted this admission.    Initially, here, she was noted to have leukopenia, transaminitis but no fever or focal signs of infection on exam.  GI were consulted for transaminitis and ID were consulted for the appearance of colitis on CT.    See also versions of history collected from Dr. Tamala Julian 8/21, Dr. Fuller Plan 8/25, Dr. Lindi Adie 8/27.        Assessment & Plan:  This is an 85 y.o. female, very functional at baseline,  history Cdiff now resolved, HTN, no dementia who presented with 1 month new progressive severe weakness, dehydration and weight loss as well as leukopenia/transaminitis.    In the hospital, she remained weak and barely taking anything by mouth.  No positive diagnosis to explain her leukopenia, weight loss and transaminitis has been made, despite input from multiple disciplines, providers.  In addition, her function and cognition worsened for several weeks.  At this time, although her leukopenia resolved, and transaminitis is resolving, her cognition and function have fluctuated only slightly, and she has had so little oral intake after 4 weeks that she is near death.  I expect she will have an imminent death from starvation, possibly sooner from pneumonia due to inability to clear secretions.        Generalized weakness due to severe protein calorie malnutrition and weight loss due to failure to thrive, not otherwise specified This was subacute, starting in June or July.  It started as reduced oral intake, ultimately leading to severe weight loss and now failure to thrive.  It is unclear whether thrush contributed to this, although thrush is no longer easily visible on exam.  Cross-sectional imaging has revealed no cancer, endoscopy has been considered but deferred in discussion with family.  There has been a question of psychiatric etiology, given the timing of the patient's sister's death shortly before this all started in May.  The patient made minimal improvements in mentation/function after 2 weeks of tube feeds by NG, and pulled the tube out twice,  indicating to me a strong personal wish not to receive enteral feeds in this way.  Given this minimal benefit, I do not think TPN would prolong her life in a meaningful way.  The possibility of a PEG tube was explored, but not possible due to her ascites.  Again, given no improvements with enteral feeding by NG, there is no reason to expect  improvement with PEG feeding.  -Continued consultation by Nutritionist, complete calorie count today   -Consult GI, appreciate cares  -Continue Ensure, Pro-stat to the extent she will take -Continue megace, mirtazapine   Odynophagia -Continue Nystatin swish and swallow to the extent able (patient mostly refuses this)  Transaminitis Cholestasis Initially had a transaminitis, ~3x ULN with AST predominance peaking around 400 on 9/2, after which had a notable rise in Alk Phos, which has also resolved.  Specific viral etiologies that have been ruled out include viral hepatitides, CMV, EBV.  Autoimmune serologies have been negative.    Percutaneous biopsy is not possible due to her ascites (due to hypoalbuminemia)  Please see assessments by Dr. Rush Landmark and Dr. Havery Moros on 9/15 and 9/12 -Repeat LFTs tomorrow  Goals of Care Please review note by Dr. Hilma Favors on 9/18 after interdisciplinary meeting.  Palliative care have repeatedly offered support during this illness, and family have asked that they not participate in her care.  Acute metabolic encephalopathy  Hypertension Cerebrovascular disease secondary prevention -Continue aspirin -Hold Plavix  Stage II pressure injury, coccyx POA  Anemia of chronic disease Leukopenia, resolved Thrombocytopenia Hematology were consulted.   Other medications -Hold PPI  Cough CXR personally reviewed, she has no focal infiltrate.  She has bibasilar atelectasis (expected given her debilitation) as well as effusions (expected given her anasarca).  WBC normal. -Robitussin PRN -Monitor fever curve  Eye pain The right eye has some mild serous drainage today.  No redness or purulence. -Hot compresses -Liquifilm PRN         MDM and disposition: The below labs and imaging reports reviewed and summarized above.  Medication management as above.  The patient was admitted with fatigue, weakness, and transaminitis.  She is still unable  to take enough by mouth to meet her nutritional and fluid requirements.  I expect an imminent hospital death.       DVT prophylaxis: Lovenox Code Status: FULL Family Communication: Daughter    Consultants:   GI  ID  Hematology  Psychiatry  Palliative Care  Procedures:   8/20 cT head  8/25 CT abdomen  8/26 MRI brain  8/28 US abdomen limitted  9/11 CT abdomen  9/18 US liver  Antimicrobials:   PO Vancomycin 9/10 >> 9/12  Cephalexin 9/10 >> 9/12   Culture data:   Urine culture 8/23 -- insignificant growth  Blood culture 8.24 x2 -- NG     Subjective: Cough stable, no change.  No new fever.  She is more sleepy today.  She has not vomited.  She has barely taking anything by mouth.  Only half a cup of soup at lunch.  She attempted to work with physical therapy today, but needed maximum assist just for bed mobility, and was unable to tolerate even sitting for a short period of time.    Objective: Vitals:   01/23/19 2054 01/24/19 0352 01/24/19 0500 01/24/19 1501  BP: (!) 158/80 (!) 149/79  136/68  Pulse: 100 (!) 101  96  Resp: '20 20  20  ' Temp: 98.6 F (37 C) 98.9 F (37.2 C)  98.4 F (36.9  C)  TempSrc: Oral Oral  Oral  SpO2: 98% 97%  97%  Weight:   60.4 kg   Height:        Intake/Output Summary (Last 24 hours) at 01/24/2019 1753 Last data filed at 01/24/2019 1700 Gross per 24 hour  Intake 61.85 ml  Output -  Net 61.85 ml   Filed Weights   01/15/19 0500 01/21/19 0500 01/24/19 0500  Weight: 60 kg 60 kg 60.4 kg    Examination: General appearance: Frail elderly female, lying in bed, keeps eyes closed. HEENT: Lips dry  Skin: Dry and tented. Cardiac: Heart rate regular, no murmurs, no lower extremity edema. Respiratory: Respiratory effort normal, no coughing, no rales or wheezes, good air entry into lower fields. Abdomen: Soft, no tenderness, no masses, no guarding. MSK: Severe diffuse loss of subcutaneous muscle mass and fat. Neuro: Keeps  eyes closed, does not make eye contact, psychomotor slowing noted, moves upper extremities symmetrically, but extremely weak. Psych: Attention distracted, affect blunted.    Data Reviewed: I have personally reviewed following labs and imaging studies:  CBC: Recent Labs  Lab 01/24/19 0224  WBC 4.5  NEUTROABS 3.6  HGB 9.2*  HCT 28.5*  MCV 99.0  PLT 098   Basic Metabolic Panel: Recent Labs  Lab 01/18/19 0455 01/21/19 0534 01/24/19 0224  NA 138 139 140  K 3.6 3.6 3.1*  CL 113* 113* 113*  CO2 19* 18* 17*  GLUCOSE 117* 72 72  BUN 26* 25* 22  CREATININE 0.71 0.72 0.68  CALCIUM 7.7* 7.8* 7.7*  MG 1.9 1.9  --   PHOS 2.9 3.4  --    GFR: Estimated Creatinine Clearance: 41.8 mL/min (by C-G formula based on SCr of 0.68 mg/dL). Liver Function Tests: Recent Labs  Lab 01/18/19 0455 01/21/19 0534 01/24/19 0224  AST 247* 110* 69*  ALT 157* 102* 59*  ALKPHOS 464* 315* 211*  BILITOT 0.6 0.8 1.3*  PROT 5.6* 5.6* 5.5*  ALBUMIN 1.8* 2.1* 1.9*   No results for input(s): LIPASE, AMYLASE in the last 168 hours. No results for input(s): AMMONIA in the last 168 hours. Coagulation Profile: No results for input(s): INR, PROTIME in the last 168 hours. Cardiac Enzymes: No results for input(s): CKTOTAL, CKMB, CKMBINDEX, TROPONINI in the last 168 hours. BNP (last 3 results) No results for input(s): PROBNP in the last 8760 hours. HbA1C: No results for input(s): HGBA1C in the last 72 hours. CBG: Recent Labs  Lab 01/22/19 1921 01/23/19 0542 01/23/19 0742 01/24/19 0711 01/24/19 0737  GLUCAP 81 79 76 66* 119*   Lipid Profile: No results for input(s): CHOL, HDL, LDLCALC, TRIG, CHOLHDL, LDLDIRECT in the last 72 hours. Thyroid Function Tests: No results for input(s): TSH, T4TOTAL, FREET4, T3FREE, THYROIDAB in the last 72 hours. Anemia Panel: No results for input(s): VITAMINB12, FOLATE, FERRITIN, TIBC, IRON, RETICCTPCT in the last 72 hours. Urine analysis:    Component Value  Date/Time   COLORURINE YELLOW 01/08/2019 2015   APPEARANCEUR HAZY (A) 01/08/2019 2015   LABSPEC 1.018 01/08/2019 2015   PHURINE 6.0 01/08/2019 2015   GLUCOSEU NEGATIVE 01/08/2019 2015   HGBUR SMALL (A) 01/08/2019 2015   BILIRUBINUR NEGATIVE 01/08/2019 2015   KETONESUR NEGATIVE 01/08/2019 2015   PROTEINUR NEGATIVE 01/08/2019 2015   UROBILINOGEN 0.2 16-Feb-202014 1048   NITRITE NEGATIVE 01/08/2019 2015   LEUKOCYTESUR NEGATIVE 01/08/2019 2015   Sepsis Labs: '@LABRCNTIP' (procalcitonin:4,lacticacidven:4)  )No results found for this or any previous visit (from the past 240 hour(s)).       Radiology  Studies: Dg Chest Port 1 View  Result Date: 01/23/2019 CLINICAL DATA:  Cough. EXAM: PORTABLE CHEST 1 VIEW COMPARISON:  Radiograph 01/13/2019 FINDINGS: Enteric tube has been removed. Lower lung volumes from prior. Patient is rotated. Cardiomegaly is more prominent than on prior exam. Unchanged mediastinal contours with aortic atherosclerosis. Moderate bilateral pleural effusions and associated bibasilar opacities, left greater than right. No evidence of pulmonary edema. No confluent airspace disease. No pneumothorax. IMPRESSION: 1. Moderate bilateral pleural effusions with associated bibasilar opacities, left greater than right, likely atelectasis. 2. Cardiomegaly is more prominent than on prior exam. Aortic Atherosclerosis (ICD10-I70.0). Electronically Signed   By: Keith Rake M.D.   On: 01/23/2019 19:30        Scheduled Meds: . aspirin EC  81 mg Oral Daily  . chlorhexidine  15 mL Mouth Rinse BID  . collagenase   Topical Daily  . dextrose      . enoxaparin (LOVENOX) injection  40 mg Subcutaneous Q24H  . feeding supplement  1 Container Oral TID BM  . feeding supplement (PRO-STAT SUGAR FREE 64)  30 mL Oral BID WC  . Gerhardt's butt cream   Topical TID  . latanoprost  1 drop Both Eyes QHS  . mouth rinse  15 mL Mouth Rinse q12n4p  . megestrol  400 mg Oral TID  . mirtazapine  30 mg Oral  QHS  . multivitamin with minerals  1 tablet Oral Daily  . nystatin  5 mL Oral 5 X Daily  . nystatin   Topical BID  . pantoprazole  40 mg Oral Daily  . sodium chloride flush  3 mL Intravenous Q12H   Continuous Infusions: . sodium chloride    . dextrose 50 mL/hr at 01/24/19 1700  . potassium chloride 10 mEq (01/24/19 1737)     LOS: 35 days    Time spent: 25 minutes    Edwin Dada, MD [facility] 01/24/2019, 5:53 PM     Please page through [place]:  [email or web address] Password [email or web address] If 7PM-7AM, please contact night-coverage

## 2019-01-24 NOTE — Progress Notes (Signed)
Patient refuses to take PRN Robitussin despite asking for it. When this RN attempted to administer it the patient would refuse and say she would take it after she had something on her stomach. This RN offered applesauce and leftover fruit from the patient's dinner tray but patient refused. Patient stated she would just wait for breakfast. This RN informed the patient that breakfast would not be served for a long time and the patient still refused. Will continue to monitor and offer Robitussin as needed.

## 2019-01-24 NOTE — Plan of Care (Signed)
  Problem: Education: Goal: Knowledge of General Education information will improve Description: Including pain rating scale, medication(s)/side effects and non-pharmacologic comfort measures Outcome: Progressing   Problem: Health Behavior/Discharge Planning: Goal: Ability to manage health-related needs will improve Outcome: Progressing   Problem: Clinical Measurements: Goal: Ability to maintain clinical measurements within normal limits will improve Outcome: Progressing Goal: Will remain free from infection Outcome: Progressing Goal: Diagnostic test results will improve Outcome: Progressing   Problem: Activity: Goal: Risk for activity intolerance will decrease Outcome: Progressing   Problem: Coping: Goal: Level of anxiety will decrease Outcome: Progressing   Problem: Elimination: Goal: Will not experience complications related to bowel motility Outcome: Progressing Goal: Will not experience complications related to urinary retention Outcome: Progressing

## 2019-01-24 NOTE — Progress Notes (Signed)
Nutrition Follow-up  DOCUMENTATION CODES:   Severe malnutrition in context of chronic illness  INTERVENTION:   -D/c calorie count -Continue Boost Breeze po TID, each supplement provides 250 kcal and 9 grams of protein -Continue MVI daily -Continue 30 ml Prostat BID, each supplement provides 100 kcals and 15 grams of protein -Magic cup TID with meals, each supplement provides 290 kcal and 9 grams of protein  NUTRITION DIAGNOSIS:   Severe Malnutrition related to chronic illness(TIA) as evidenced by energy intake < or equal to 75% for > or equal to 1 month, moderate fat depletion, severe fat depletion, moderate muscle depletion, severe muscle depletion.  Ongoing  GOAL:   Patient will meet greater than or equal to 90% of their needs  Progressing   MONITOR:   PO intake, Supplement acceptance, Diet advancement, Labs, Weight trends, TF tolerance, Skin, I & O's  REASON FOR ASSESSMENT:   Consult Assessment of nutrition requirement/status, Calorie Count  ASSESSMENT:   Sydney [name] is a 85 y.o. 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  Patient is accompanied by her daughter who assists with the history.  Patient has history of recurrent C. difficile colitis, has continued to have loose stools, though this has improved to 4 or 5/day, has had progressively worsening appetite, eating only 3 or 4 bites and unable to even drink half of a nutritional supplement drink.  Patient denies any abdominal pain since the recent hospitalization, no fevers or chills have been noted, and she denies any dysuria though she may have complained of this a few days ago.  She was requiring assistance with ambulation and had home health PT set  up at recent discharge, but has become even more weak in general since leaving the hospital and had a near syncopal episode today when she tried to get up from a bedside commode.  8/27- megace initiated, SLP recommend D3 diet with thin liquids 8/29- nystatin added for oral thrush 9/4- pt daughter agreeable to TF 9/5- NGT placement unsuccessful x 2 (RN bedside placement) 9/7- cortrak tube placed; tip of tube confirmed in stomach 9/18- per palliative care notes, pt daughter continues to desire aggressive care  9/22- cortrak tube displaced, reinserted by IR 9/24- per IR, pt not a candidate for PEG  9/25- cortrak removed, per MD no plan to replace currently  Reviewed I/O's: +70 ml x 24 hours and +4.3 L since 01/10/19  Calorie count results:  Day 1 Breakfast: 1 spoonful of oatmeal, 1 spoonful of eggs (approximately 22 kcals, 0 grams protein) Lunch: 5% cream of chicken soup and 70% apple juice ( 49 kcals, 0 grams protein) Supplements: 1/2 boost breeze  Total intake: 196 kcal (16% of minimum estimated needs)  5 grams protein (8% of minimum estimated needs)  Case discussed with RN, who reports pt is more alert and eating a little better today, but still minimally. Pt is taking mainly bites and sips at meals (per RN, pt is consuming 5% of meals at best). RD confirmed again that there were no plans to replace cortrak tube at this time and pt is not a candidate for a PEG. Pt remains unable to meet nutritional needs via PO route alone.   Pt refusing physical therapy and some medications.   Per palliative care notes, pt daughter  still requesting aggressive care for pt.   Labs reviewed: K: 3.1, CBGS: 66-119.   Diet Order:   Diet Order            DIET DYS 3 Room service appropriate? Yes; Fluid consistency: Thin  Diet effective now              EDUCATION NEEDS:   Not appropriate for education at this time  Skin:  Skin Assessment: Skin Integrity Issues: Skin Integrity Issues:: Stage II,  Unstageable Stage II: sacrum Unstageable: coccyx  Last BM:  01/23/19  Height:   Ht Readings from Last 1 Encounters:  12/20/18 5\' 3"  (1.6 m)    Weight:   Wt Readings from Last 1 Encounters:  01/24/19 60.4 kg    Ideal Body Weight:  52.3 kg  BMI:  Body mass index is 23.59 kg/m.  Estimated Nutritional Needs:   Kcal:  1250-1450  Protein:  60-75 grams  Fluid:  > 1.2 L    Sydney [name] A. Jimmye Norman, RD, LDN, [place] Registered Dietitian II Certified Diabetes Care and Education Specialist Pager: [phone] After hours Pager: [phone]

## 2019-01-24 NOTE — Progress Notes (Signed)
Physical Therapy Treatment Patient Details Name: Sydney [name] MRN: [id number] DOB: 04-Nov-1930 Today's Date: 01/24/2019    History of Present Illness Sydney [name] is a 85 y.o. 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 Pt pulled out NG tube 9/25    PT Comments    Pt limited by pain in buttocks during session. Pt more agreeable to participation this session, however unable to tolerate prolonged periods of sitting due to sacral wound. Pt requiring maxA for bed mobility at this time. Pt with significantly limited tolerance for activity at this time and will continued to benefit from PT POC to progress upon her deficits and reduce caregiver burden. Recommendations: Home with Home health PT, hospital bed, manual wheelchair, and wheelchair cushion.   Follow Up Recommendations  SNF;Home health PT(dtr refusing SNF per chart)     Equipment Recommendations  Wheelchair (measurements PT);Wheelchair cushion (measurements PT);Hospital bed    Recommendations for Other Services       Precautions / Restrictions Precautions Precautions: Fall Restrictions Weight Bearing Restrictions: No    Mobility  Bed Mobility Overal bed mobility: Needs Assistance Bed Mobility: Supine to Sit;Sit to Supine     Supine to sit: Max assist Sit to supine: Total assist      Transfers                    Ambulation/Gait                 Stairs             Wheelchair Mobility    Modified Rankin (Stroke Patients Only)       Balance Overall balance assessment: Needs assistance Sitting-balance support: Single extremity supported Sitting balance-Leahy Scale: Poor                                       Cognition Arousal/Alertness: Awake/alert Behavior During Therapy: Restless;Impulsive Overall Cognitive Status: Impaired/Different from baseline Area of Impairment: Orientation;Memory;Safety/judgement;Awareness                     Memory: Decreased short-term memory Following Commands: Follows one step commands inconsistently;Follows one step commands with increased time Safety/Judgement: Decreased awareness of safety;Decreased awareness of deficits            Exercises      General Comments        Pertinent Vitals/Pain Pain Assessment: Faces Faces Pain Scale: Hurts even more Pain Location: bottom Pain Descriptors / Indicators: Grimacing Pain Intervention(s): Limited activity within patient's tolerance    Home Living                      Prior Function            PT Goals (current goals can now be found in the care plan section) Acute Rehab PT Goals Patient Stated Goal: per daughter to build some strength and get patient home Progress towards PT goals: Progressing toward goals    Frequency    Min 2X/week      PT Plan Current plan remains appropriate    Co-evaluation              AM-PAC PT "  6 Clicks" Mobility   Outcome Measure  Help needed turning from your back to your side while in a flat bed without using bedrails?: A Lot Help needed moving from lying on your back to sitting on the side of a flat bed without using bedrails?: Total Help needed moving to and from a bed to a chair (including a wheelchair)?: Total Help needed standing up from a chair using your arms (e.g., wheelchair or bedside chair)?: Total Help needed to walk in hospital room?: Total Help needed climbing 3-5 steps with a railing? : Total 6 Click Score: 7    End of Session Equipment Utilized During Treatment: (none) Activity Tolerance: Patient limited by pain Patient left: in bed;with call bell/phone within reach;with family/visitor present Nurse  Communication: Mobility status PT Visit Diagnosis: Muscle weakness (generalized) (M62.81);Unsteadiness on feet (R26.81);Adult, failure to thrive (R62.7);Difficulty in walking, not elsewhere classified (R26.2)     Time: [id number] PT Time Calculation (min) (ACUTE ONLY): 16 min  Charges:  $Therapeutic Activity: 8-22 mins                     Zenaida Niece, PT, DPT Acute Rehabilitation Pager: [phone]    Zenaida Niece 01/24/2019, 2:28 PM

## 2019-01-24 NOTE — Progress Notes (Signed)
PT Cancellation Note  Patient Details Name: Sydney [name] MRN: [id number] DOB: 01-31-1931   Cancelled Treatment:    Reason Eval/Treat Not Completed: Other (comment). PT attempts to initiate mobilization of patient however patient becomes slightly agitated and continuously requests to return to bed to rest despite family and PT encouragement. Pt demonstrating limited participation with PT over past 2 attempts despite reports from daughter that she has been transferring to bedside commode and sitting edge of bed with nursing assistance. PT will continue to follow, however if patient remains a limited participant PT may sign off.   Zenaida Niece 01/24/2019, 12:38 PM

## 2019-01-25 DIAGNOSIS — Z8673 Personal history of transient ischemic attack (TIA), and cerebral infarction without residual deficits: Secondary | ICD-10-CM | POA: Diagnosis not present

## 2019-01-25 DIAGNOSIS — R627 Adult failure to thrive: Secondary | ICD-10-CM | POA: Diagnosis not present

## 2019-01-25 DIAGNOSIS — R531 Weakness: Secondary | ICD-10-CM | POA: Diagnosis not present

## 2019-01-25 DIAGNOSIS — R945 Abnormal results of liver function studies: Secondary | ICD-10-CM | POA: Diagnosis not present

## 2019-01-25 LAB — GLUCOSE, CAPILLARY
Glucose-Capillary: 69 mg/dL — ABNORMAL LOW (ref 70–99)
Glucose-Capillary: 94 mg/dL (ref 70–99)

## 2019-01-25 MED ORDER — DEXTROSE 50 % IV SOLN
INTRAVENOUS | Status: AC
  Administered 2019-01-25: 50 mL
  Filled 2019-01-25: qty 50

## 2019-01-25 MED ORDER — DEXTROSE 50 % IV SOLN
12.5000 g | INTRAVENOUS | Status: AC
  Administered 2019-01-25: 09:00:00 12.5 g via INTRAVENOUS

## 2019-01-25 NOTE — Progress Notes (Signed)
PROGRESS NOTE        PATIENT DETAILS Name: Sydney [name] Age: 85 y.o. Sex: female Date of Birth: [birth year] Admit Date: 12/19/2018 Admitting Physician Vianne Bulls, MD [id number], Carlota Raspberry, MD  Brief Narrative: Patient is a 85 y.o. 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  See below for further details.  Subjective: Daughter at bedside-Per daughter-patient is much more awake-and alert compared to the past few days.  She is also starting to eat a few more bites now.  Assessment/Plan: Progressive generalized weakness associated with failure to thrive syndrome/weight loss: Etiology is uncertain-in spite of significant work-up-no obvious etiology evident.  TSH/a.m. cortisol/blood cultures all unremarkable.  CT of the abdomen without any obvious malignancy.  MRI brain without any obvious etiology.  Endoscopic evaluation contemplated but given patient's tenuous clinical state-and suspicion of low yield for diagnoses-not pursued.  Not sure if a transient hepatitis-in a cholestatic pattern (?  Drug-induced hepatitis) could have caused this much of weakness.  However, with improvement in her LFTs-her mentation/weakness/appetite seems to have slowly improved.  Given that the patient has started to slowly improve-and starting to have some oral intake-do not think we need to pursue PEG tube or TNA at this point.  Continue Megace, mirtazapine-maximize supplements months as much as possible and follow.  Reconsult physical therapy-out of bed to chair.  Encourage use of incentive spirometry.  Acute metabolic encephalopathy: Etiology uncertain-see above-however she is improved this morning-is hard of hearing but able to  answer most of my questions appropriately.  Transaminitis: Mostly in a cholestatic pattern-viral hepatitis serology including CMV/EBV are negative.  Autoimmune hepatitis panel negative.  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  Thankfully LFTs are improved-as noted above-not sure if a drug-induced hepatitis  (?which drug) process can cause this much of encephalopathy (ammonia within normal limits) and severe failure to thrive syndrome.  CVA: Has generalized weakness but nonfocal-continue aspirin-if clinical improvement continues and no further procedures are contemplated-suspect we could resume Plavix soon.  Odynophagia: Improving-continue nystatin.  Thrombocytopenia: Resolved-suspect was secondary to transient hepatitis.  Anemia: Likely secondary to acute illness  HTN: Blood pressure controlled without the use of any antihypertensives.  Deconditioning/debility: Secondary to prolonged hospitalization/acute illness-reconsult PT-probably will require several weeks/months before patient can regain prior functional status.  Nutrition Problem: Nutrition Problem: Severe Malnutrition Etiology: chronic illness(TIA) Signs/Symptoms: energy intake < or equal to 75% for > or equal to 1 month, moderate fat depletion, severe fat depletion, moderate muscle depletion, severe muscle depletion Interventions: Tube feeding (now discontinued)   Diet: Diet Order            DIET DYS 3 Room service appropriate? Yes; Fluid consistency: Thin  Diet effective now               DVT Prophylaxis: Prophylactic Lovenox   Code Status: Full code   Family Communication: Daughter at bedside  Disposition Plan: Remain inpatient-daughter refuses SNF-we will maximize home health services on discharge.  Antimicrobial agents: Anti-infectives (From admission, onward)   Start     Dose/Rate Route Frequency Ordered Stop   01/06/19 2200  cephALEXin (KEFLEX) 250 MG/5ML suspension 500  mg  500 mg Per Tube Every 12 hours 01/06/19 1734 01/08/19 2141   01/06/19 1045  ceFAZolin (ANCEF) IVPB 1 g/50 mL premix  Status:  Discontinued     1 g 100 mL/hr over 30 Minutes Intravenous Every 12 hours 01/06/19 1037 01/06/19 1734   01/06/19 1000  vancomycin (VANCOCIN) 50 mg/mL oral solution 125 mg     125 mg Per Tube Every 12 hours 01/06/19 0849 01/08/19 1226   01/05/19 1200  cefTRIAXone (ROCEPHIN) 1 g in sodium chloride 0.9 % 100 mL IVPB  Status:  Discontinued     1 g 200 mL/hr over 30 Minutes Intravenous Every 24 hours 01/05/19 1125 01/06/19 1037      Procedures: None  CONSULTS:  GI and hematology/oncology  Time spent: 25- minutes-Greater than 50% of this time was spent in counseling, explanation of diagnosis, planning of further management, and coordination of care.  MEDICATIONS: Scheduled Meds:  aspirin EC  81 mg Oral Daily   chlorhexidine  15 mL Mouth Rinse BID   enoxaparin (LOVENOX) injection  40 mg Subcutaneous Q24H   feeding supplement  1 Container Oral TID BM   feeding supplement (PRO-STAT SUGAR FREE 64)  30 mL Oral BID WC   Gerhardt's butt cream   Topical TID   latanoprost  1 drop Both Eyes QHS   mouth rinse  15 mL Mouth Rinse q12n4p   megestrol  400 mg Oral TID   mirtazapine  30 mg Oral QHS   multivitamin with minerals  1 tablet Oral Daily   nystatin  5 mL Oral 5 X Daily   nystatin   Topical BID   pantoprazole  40 mg Oral Daily   sodium chloride flush  3 mL Intravenous Q12H   Continuous Infusions:  sodium chloride     PRN Meds:.sodium chloride, acetaminophen **OR** acetaminophen, guaiFENesin-dextromethorphan, hydroxypropyl methylcellulose / hypromellose, magic mouthwash, [DISCONTINUED] ondansetron **OR** ondansetron (ZOFRAN) IV, sodium chloride flush   PHYSICAL EXAM: Vital signs: Vitals:   01/24/19 2230 01/25/19 0500 01/25/19 0620 01/25/19 1622  BP: 140/76  138/65 125/73  Pulse: 82  78 92  Resp: 20  20 (!) 22  Temp: 98.5 F (36.9  C)  98.6 F (37 C) 98.8 F (37.1 C)  TempSrc: Oral  Oral Oral  SpO2: 98%  98% 95%  Weight:  60.4 kg    Height:       Filed Weights   01/21/19 0500 01/24/19 0500 01/25/19 0500  Weight: 60 kg 60.4 kg 60.4 kg   Body mass index is 23.59 kg/m.   Gen Exam:Alert awake-not in any distress.  Looks very frail and weak HEENT:atraumatic, normocephalic Chest: B/L clear to auscultation anteriorly CVS:S1S2 regular Abdomen:soft non tender, non distended Extremities: Trace edema Neurology: Non focal-but has generalized weakness Skin: no rash  I have personally reviewed following labs and imaging studies  LABORATORY DATA: CBC: Recent Labs  Lab 01/24/19 0224  WBC 4.5  NEUTROABS 3.6  HGB 9.2*  HCT 28.5*  MCV 99.0  PLT [id number]    Basic Metabolic Panel: Recent Labs  Lab 01/21/19 0534 01/24/19 0224  NA 139 140  K 3.6 3.1*  CL 113* 113*  CO2 18* 17*  GLUCOSE 72 72  BUN 25* 22  CREATININE 0.72 0.68  CALCIUM 7.8* 7.7*  MG 1.9  --   PHOS 3.4  --     GFR: Estimated Creatinine Clearance: 41.8 mL/min (by C-G formula based on SCr of 0.68 mg/dL).  Liver Function Tests: Recent Labs  Lab 01/21/19 0534 01/24/19  0224  AST 110* 69*  ALT 102* 59*  ALKPHOS 315* 211*  BILITOT 0.8 1.3*  PROT 5.6* 5.5*  ALBUMIN 2.1* 1.9*   No results for input(s): LIPASE, AMYLASE in the last 168 hours. No results for input(s): AMMONIA in the last 168 hours.  Coagulation Profile: No results for input(s): INR, PROTIME in the last 168 hours.  Cardiac Enzymes: No results for input(s): CKTOTAL, CKMB, CKMBINDEX, TROPONINI in the last 168 hours.  BNP (last 3 results) No results for input(s): PROBNP in the last 8760 hours.  HbA1C: No results for input(s): HGBA1C in the last 72 hours.  CBG: Recent Labs  Lab 01/23/19 0742 01/24/19 0711 01/24/19 0737 01/25/19 0829 01/25/19 0951  GLUCAP 76 66* 119* 69* 94    Lipid Profile: No results for input(s): CHOL, HDL, LDLCALC, TRIG, CHOLHDL, LDLDIRECT  in the last 72 hours.  Thyroid Function Tests: No results for input(s): TSH, T4TOTAL, FREET4, T3FREE, THYROIDAB in the last 72 hours.  Anemia Panel: No results for input(s): VITAMINB12, FOLATE, FERRITIN, TIBC, IRON, RETICCTPCT in the last 72 hours.  Urine analysis:    Component Value Date/Time   COLORURINE YELLOW 01/08/2019 2015   APPEARANCEUR HAZY (A) 01/08/2019 2015   LABSPEC 1.018 01/08/2019 2015   PHURINE 6.0 01/08/2019 2015   GLUCOSEU NEGATIVE 01/08/2019 2015   HGBUR SMALL (A) 01/08/2019 2015   BILIRUBINUR NEGATIVE 01/08/2019 2015   KETONESUR NEGATIVE 01/08/2019 2015   PROTEINUR NEGATIVE 01/08/2019 2015   UROBILINOGEN 0.2 10/21/2012 1048   NITRITE NEGATIVE 01/08/2019 2015   LEUKOCYTESUR NEGATIVE 01/08/2019 2015    Sepsis Labs: Lactic Acid, Venous    Component Value Date/Time   LATICACIDVEN 1.4 12/20/2018 0012    MICROBIOLOGY: No results found for this or any previous visit (from the past 240 hour(s)).  RADIOLOGY STUDIES/RESULTS: Dg Abd 1 View  Result Date: 01/17/2019 CLINICAL DATA:  85 year old female with a history of enteric tube placement EXAM: ABDOMEN - 1 VIEW; DG NASO G TUBE PLC W/FL-NO RAD COMPARISON:  January 13, 2019 FINDINGS: Limited plain film demonstrates post pyloric tube terminating in the 3/4 portion of the duodenum. IMPRESSION: Post pyloric feeding tube terminates in the 3/4 portion the duodenum. Electronically Signed   By: Corrie Mckusick D.O.   On: 01/17/2019 15:55   Dg Abd 1 View  Result Date: 01/13/2019 CLINICAL DATA:  Blocked feeding tube. EXAM: ABDOMEN - 1 VIEW COMPARISON:  12/29/2018 FINDINGS: Enteric fusion tube is present with tip over the region of the distal stomach just right of midline in the mid abdomen. Bowel gas pattern is nonobstructive. Remainder of the exam is unchanged. IMPRESSION: Nonobstructive bowel gas pattern. Enteric feeding tube with tip right of midline in the mid abdomen likely over the distal stomach. Electronically Signed    By: Marin Olp M.D.   On: 01/13/2019 12:28   Dg Abd 1 View  Result Date: 12/29/2018 CLINICAL DATA:  Generalized abdominal pain EXAM: ABDOMEN - 1 VIEW COMPARISON:  12/19/2018 FINDINGS: Small bilateral pleural effusions. Mild diffuse gaseous dilatation of bowel without definitive obstruction. Coarse calcifications in the pelvis as before. IMPRESSION: 1. Small bilateral pleural effusions 2. Mild diffuse gaseous prominence of bowel without obstructive pattern. Findings could be secondary to mild ileus or enteritis. Electronically Signed   By: Donavan Foil M.D.   On: 12/29/2018 20:31   Ct Abdomen Pelvis W Contrast  Result Date: 01/06/2019 CLINICAL DATA:  Abdominal pain EXAM: CT ABDOMEN AND PELVIS WITH CONTRAST TECHNIQUE: Multidetector CT imaging of the abdomen and pelvis was  performed using the standard protocol following bolus administration of intravenous contrast. CONTRAST:  121mL OMNIPAQUE IOHEXOL 300 MG/ML  SOLN COMPARISON:  CT December 20, 2018 FINDINGS: Lower chest: Moderate bilateral effusions with adjacent areas of passive atelectasis. Mild airways thickening is present. Normal heart size. No pericardial effusion. Hepatobiliary: Stable 11 mm hepatic cyst. No concerning liver abnormality is seen. No calcified gallstones, gallbladder wall thickening or biliary ductal dilatation. Pancreas: Unremarkable. No pancreatic ductal dilatation or surrounding inflammatory changes. Spleen: Diminutive spleen. Otherwise unremarkable. Adrenals/Urinary Tract: Adrenal glands are unremarkable. Kidneys are normal, without renal calculi, focal lesion, or hydronephrosis. Urinary bladder is distended but otherwise unremarkable. Stomach/Bowel: Transesophageal tube tip terminates at the level of the gastric antrum. There is edematous mural thickening of much of the small bowel and more focally pronounced in the cecum and ascending colon. Distal colon assumes a less thickened appearance. The appendix is not well visualized.  Vascular/Lymphatic: Atherosclerotic plaque within the normal caliber aorta. Portal and hepatic veins are patent. Venous engorgement of the gonadal veins. No suspicious or enlarged lymph nodes in the included lymphatic chains. Reproductive: Parametrial calcifications and engorged gonadal veins, as above. Few scattered calcified pelvic fibroids within a retroverted uterus. No discernible adnexal lesions. Other: Moderate volume of free intraperitoneal ascites with diffusely increased attenuation of the mesenteric leaflets and fluid tracking and bilateral inguinal hernias. Extensive circumferential body wall edema is present as well. As well as milder edematous changes in the soft tissues of the imaged extremities. No free intraperitoneal air Musculoskeletal: Degenerative changes present in both wrists. Multilevel degenerative changes are present in the imaged portions of the spine. Disc bulges present at L3-4 and L4-5. Vacuum disc noted at L3-4 as well. IMPRESSION: 1. Moderate volume of free intraperitoneal ascites, bilateral effusions, circumferential body wall and extremity edema compatible with anasarca. 2. Edematous mural thickening of much of the small bowel, more focally pronounced in the cecum and ascending colon, possibly reflective of the patient's fluid status though cannot exclude underlying enterocolitis. Correlate with symptoms. 3. Venous engorgement of the gonadal veins, which can be seen with pelvic congestion syndrome. 4. Retroverted fibroid uterus. 5. Bilateral fluid-filled inguinal hernias 6. Aortic Atherosclerosis (ICD10-I70.0). Electronically Signed   By: Lovena Le M.D.   On: 01/06/2019 21:13   Dg Chest Port 1 View  Result Date: 01/23/2019 CLINICAL DATA:  Cough. EXAM: PORTABLE CHEST 1 VIEW COMPARISON:  Radiograph 01/13/2019 FINDINGS: Enteric tube has been removed. Lower lung volumes from prior. Patient is rotated. Cardiomegaly is more prominent than on prior exam. Unchanged mediastinal  contours with aortic atherosclerosis. Moderate bilateral pleural effusions and associated bibasilar opacities, left greater than right. No evidence of pulmonary edema. No confluent airspace disease. No pneumothorax. IMPRESSION: 1. Moderate bilateral pleural effusions with associated bibasilar opacities, left greater than right, likely atelectasis. 2. Cardiomegaly is more prominent than on prior exam. Aortic Atherosclerosis (ICD10-I70.0). Electronically Signed   By: Keith Rake M.D.   On: 01/23/2019 19:30   Dg Chest Port 1 View  Result Date: 01/13/2019 CLINICAL DATA:  Patient is spitting up 2 feeds. Possible aspiration. EXAM: PORTABLE CHEST 1 VIEW COMPARISON:  Single-view of the chest 12/16/2018. FINDINGS: Feeding tube courses into the stomach and below the inferior margin of the film. The patient has moderate bilateral pleural effusions. Mild basilar atelectasis noted. There is cardiomegaly without edema. No pneumothorax. No acute or focal bony abnormality. IMPRESSION: Moderate layering pleural effusions with mild bibasilar atelectasis. Cardiomegaly without edema. Electronically Signed   By: Inge Rise M.D.  On: 01/13/2019 09:47   Dg [place]ison Bailey G Tube Plc W/fl-no Rad  Result Date: 01/17/2019 CLINICAL DATA:  85 year old female with a history of enteric tube placement EXAM: ABDOMEN - 1 VIEW; DG NASO G TUBE PLC W/FL-NO RAD COMPARISON:  January 13, 2019 FINDINGS: Limited plain film demonstrates post pyloric tube terminating in the 3/4 portion of the duodenum. IMPRESSION: Post pyloric feeding tube terminates in the 3/4 portion the duodenum. Electronically Signed   By: Corrie Mckusick D.O.   On: 01/17/2019 15:55   US Liver Doppler  Result Date: 01/13/2019 CLINICAL DATA:  85 year old female with elevated liver enzymes. Evaluate for portal vein stenosis/occlusion. EXAM: DUPLEX ULTRASOUND OF LIVER TECHNIQUE: Color and duplex Doppler ultrasound was performed to evaluate the hepatic in-flow and out-flow  vessels. COMPARISON:  Prior CT abdomen/pelvis 01/06/2019 FINDINGS: Liver: Normal parenchymal echogenicity. Normal hepatic contour without nodularity. No focal lesion, mass or intrahepatic biliary ductal dilatation. Portal Vein Velocities Main:  35 cm/sec with normal hepatopetal flow. Right:  23 cm/sec with normal hepatopetal flow. Left:  27 cm/sec with normal hepatopetal flow. Hepatic Vein Velocities Right:  16 cm/sec Middle:  27 cm/sec Left:  23 cm/sec IVC: Present and patent with normal respiratory phasicity. Hepatic Artery Velocity:  102 cm/sec Splenic Vein Velocity:  31 cm/sec Varices: Absent Ascites: Small volume ascites. Other: Bilateral pleural effusions are noted incidentally. IMPRESSION: 1. Patent and normal appearing portal and hepatic veins. 2. Small volume ascites. 3. Bilateral moderate pleural effusions. 4. Normal sonographic appearance of the liver. Electronically Signed   By: Jacqulynn Cadet M.D.   On: 01/13/2019 16:00     LOS: 36 days   Oren Binet, MD  [facility]  If 7PM-7AM, please contact night-coverage  Please page via [email or web address]  Go to [email or web address] and use [place]'s universal password to access. If you do not have the password, please contact the hospital operator.  Locate the [facility] provider you are looking for under [facility] and page to a number that you can be directly reached. If you still have difficulty reaching the provider, please page the [facility] (Director on Call) for the Hospitalists listed on amion for assistance.  01/25/2019, 4:47 PM

## 2019-01-25 NOTE — TOC Progression Note (Signed)
Transition of Care [facility]) - Progression Note    Patient Details  Name: Sydney [name] MRN: [id number] Date of Birth: July 11, 1930  [facility]) CM/SW [place], [place] Phone Number: 01/25/2019, 10:01 AM  Clinical Narrative:    TOC team continues to follow; currently pt has [address] arranged with [place]. We follow for pt stability and ongoing [place] discussions. Should pt family desire SNF our team can assist with that, at current time pt daughter desires for pt to return home when she is medically ready and we will do our best to assist with home care should that remain the plan.    Expected Discharge Plan: [place] Barriers to Discharge: Continued Medical Work up, Other (comment)(ethics case ([place]))  Expected Discharge Plan and Services Expected Discharge Plan: [place] Acute Care Choice: [place] arrangements for the past 2 months: Single Family Home           DME Arranged: N/A HH Arranged: PT, OT, RN [place] Agency: [place] ([facility]) Date HH Agency Contacted: 12/21/18 Time [place]: 1614 Representative spoke with at [place]: [place] ([place]) Interventions    Readmission Risk Interventions No flowsheet data found.

## 2019-01-25 NOTE — Progress Notes (Signed)
Pt's CBG was 69.  12.5 grams D 50 given IV and dr. Sloan Leiter notified.  Pt refusing to take any PO right now.  Will recheck in 15 min.

## 2019-01-25 NOTE — Progress Notes (Signed)
Pt ate 30% of breakfast this am.

## 2019-01-25 NOTE — Ethics Note (Signed)
Ethics can follow if any further questions re: futility arise  Verneita Griffes, MD Ethics Committee

## 2019-01-25 NOTE — TOC Initial Note (Addendum)
Transition of Care [facility]) - Initial/Assessment Note    Patient Details  Name: Sydney [name] MRN: [id number] Date of Birth: 1930-12-22  Transition of Care [facility]) CM/SW Contact:    Marilu Favre, RN Phone Number: 01/25/2019, 11:30 AM  Clinical Narrative:                  Spoke to Dr Sloan Leiter and patient's [name] . [name] would like to take her mother home at discharge.   Patient has a walker and 3 in 1 at home already.   Angela requesting wheel chair and hospital bed and hoyer lift. Ordered same with Zack with [place].  Patient has [place] prior to hospital admission , however, patient was readmitted before [facility] could make first visit. Angela requesting daily visits for [facility] and HHPT. Explained home health do not make daily visits. [name] wants to pay for daily visits " money is not a problem". [employer] [employer] has private duty . Discussed NCM will call [facility] and ask for a price on daily visits from [facility] and [place]. Also explained to [name] , that home health may not have the staff to provide daily visits. [name] voiced understanding. Spoke with Tommi Rumps with [employer], he will contact his office for price and availability of staff.  At time of discharge [name] does want patient to go home by [facility]. Confirmed face sheet address.  Cory with [employer] [facility] will call [name] directly with cost of [facility] and HHPT daily visits. It would also take some time to arrange visits if [name] agrees to payment.  Expected Discharge Plan: [place] Barriers to Discharge: Continued Medical Work up   Patient Goals and CMS Choice Patient states their goals for this hospitalization and ongoing recovery are:: to go home CMS Medicare.gov Compare Post Acute Care list provided to:: Patient Choice offered to / list presented to : Adult Children  Expected Discharge Plan and Services Expected Discharge Plan: [place] In-house Referral: Ehtics Consult, Nutrition,  Chaplain Discharge Planning Services: CM Consult Post Acute Care Choice: Durable Medical Equipment, Home Health Living arrangements for the past 2 months: [place]                 DME Arranged: Hospital bed, Wheelchair manual DME Agency: [employer] Date DME Agency Contacted: 01/25/19 Time DME Agency Contacted: [phone] Representative spoke with at DME Agency: zack HH Arranged: RN, PT [place] Agency: [place] Date [place]: 01/25/19 Time [place]: 1129 Representative spoke with at [place]: Tommi Rumps checking on cost of daily RN and PT visits  Prior Living Arrangements/Services Living arrangements for the past 2 months: Single Family Home Lives with:: Adult Children Patient language and need for interpreter reviewed:: Yes Do you feel safe going back to the place where you live?: Yes      Need for Family Participation in Patient Care: Yes (Comment) Care giver support system in place?: Yes (comment) Current home services: DME Criminal Activity/Legal Involvement Pertinent to Current Situation/Hospitalization: No - Comment as needed  Activities of Daily Living      Permission Sought/Granted   Permission granted to share information with : Yes, Verbal Permission Granted  Share Information with NAME: [name] [phone]  Permission granted to share info w AGENCY: [facility] and [place] granted to share info w Relationship: daughter     Emotional Assessment Appearance:: Appears stated age Attitude/Demeanor/Rapport: Engaged Affect (typically observed): Accepting Orientation: : Fluctuating Orientation (  Suspected and/or reported Sundowners) Alcohol / Substance Use: Not Applicable Psych Involvement: No (comment)  Admission diagnosis:  Dehydration [E86.0] Elevated LFTs [R94.5] Near syncope [R55] Patient Active Problem List   Diagnosis Date Noted  . MDD (major depressive disorder) 01/12/2019  . Adult failure to thrive 01/02/2019  .  Evaluation by psychiatric service required   . Malnutrition of moderate degree 12/20/2018  . Elevated LFTs   . Near syncope   . General weakness 12/17/2018  . Generalized weakness 12/16/2018  . Pressure injury of skin 12/16/2018  . Transaminitis 12/16/2018  . Ataxia 10/02/2017  . History of TIA (transient ischemic attack) 10/02/2017  . HTN (hypertension) 10/02/2017  . HLD (hyperlipidemia) 10/02/2017  . Palpable mass of neck-?? lymph node 10/02/2017  . Protein-calorie malnutrition, severe 10/20/2016  . Hypertension 10/17/2016  . High cholesterol 10/17/2016  . History of Clostridioides difficile colitis 10/17/2016  . Dehydration with hyponatremia 10/17/2016  . Acute hypokalemia 10/17/2016  . Protein calorie malnutrition ([place]) 10/17/2016  . C. difficile colitis 10/05/2016  . Physical deconditioning 10/05/2016  . Recurrent Clostridium difficile diarrhea 10/05/2016  . Balance problem 10/05/2016  . History of transient ischemic attack (TIA) 10/05/2016  . Dehydration   . Hypokalemia 05/14/2016  . Anemia 05/14/2016  . Hyponatremia 05/14/2016  . Glaucoma 05/14/2016  . Meningioma ([place]) 05/01/2015  . 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 Diplopia 04/08/2015  . Pancytopenia ([place]) 04/08/2015  . Bradycardia 02/17/2013  . Occlusion and stenosis of carotid artery without mention of cerebral infarction 02/17/2013   PCP:  Yetta Flock, MD Pharmacy:   [facility] [place], [place] - [address] [place] [address] Phone: [phone] Fax: [phone]     Social Determinants of Health (SDOH) Interventions    Readmission Risk Interventions No flowsheet data found.

## 2019-01-25 NOTE — Consult Note (Signed)
[place] Nurse wound follow up Patient receiving care in [place].  Assisted with coccyx wound assessment by day and night shift RNs Wound type: Coccyx wound now debrided.  It is a stage 3 PI Measurement: 1.2 cm x 1.2 cm x 0.6cm Wound bed: 98% pink, 2% yellow fibrous stands Drainage (amount, consistency, odor) yellow on existing foam dressing Periwound: intact Dressing procedure/placement/frequency: Place a small piece of Xeroform gauze into the coccyx wound bed. Cover with a foam dressing. Change daily. [name], RN, MSN, CWOCN, CNS-BC, pager [phone]

## 2019-01-25 NOTE — Care Management (Signed)
    Durable Medical Equipment  (From admission, onward)         Start     Ordered   01/25/19 1125  For home use only DME lightweight manual wheelchair with seat cushion  Once    Comments: Patient suffers from Generalized weakness due to severe protein calorie malnutrition and weight loss due to failure to thrive, which impairs their ability to perform daily activities like ambulating  in the home.  A cane will not resolve  issue with performing activities of daily living. A wheelchair will allow patient to safely perform daily activities. Patient is not able to propel themselves in the home using a standard weight wheelchair due to Generalized weakness due to severe protein calorie malnutrition and weight loss due to failure to thrive, . Patient can self propel in the lightweight wheelchair. Length of need lifetime Accessories: elevating leg rests (ELRs), wheel locks, extensions and anti-tippers.  Seat and back cushions   Call daughter Phalon Katsaros [phone] for delivery   01/25/19 1126   01/25/19 1124  For home use only DME Hospital bed  Once    Comments: Call daughter Ladell Houtman [phone] for delivery  Question Answer Comment  Length of Need Lifetime   Patient has (list medical condition): Generalized weakness due to severe protein calorie malnutrition and weight loss due to failure to thrive,   The above medical condition requires: Patient requires the ability to reposition frequently   Head must be elevated greater than: 45 degrees   Bed type Semi-electric   Hoyer Lift Yes   Support Surface: Alternating Pressure Pad and Pump      01/25/19 1124

## 2019-01-26 DIAGNOSIS — R7989 Other specified abnormal findings of blood chemistry: Secondary | ICD-10-CM | POA: Diagnosis not present

## 2019-01-26 DIAGNOSIS — R627 Adult failure to thrive: Secondary | ICD-10-CM | POA: Diagnosis not present

## 2019-01-26 DIAGNOSIS — R531 Weakness: Secondary | ICD-10-CM | POA: Diagnosis not present

## 2019-01-26 DIAGNOSIS — Z8673 Personal history of transient ischemic attack (TIA), and cerebral infarction without residual deficits: Secondary | ICD-10-CM | POA: Diagnosis not present

## 2019-01-26 LAB — COMPREHENSIVE METABOLIC PANEL
ALT: 49 U/L — ABNORMAL HIGH (ref 0–44)
AST: 54 U/L — ABNORMAL HIGH (ref 15–41)
Albumin: 1.9 g/dL — ABNORMAL LOW (ref 3.5–5.0)
Alkaline Phosphatase: 191 U/L — ABNORMAL HIGH (ref 38–126)
Anion gap: 4 — ABNORMAL LOW (ref 5–15)
BUN: 16 mg/dL (ref 8–23)
CO2: 20 mmol/L — ABNORMAL LOW (ref 22–32)
Calcium: 7.7 mg/dL — ABNORMAL LOW (ref 8.9–10.3)
Chloride: 110 mmol/L (ref 98–111)
Creatinine, Ser: 0.73 mg/dL (ref 0.44–1.00)
GFR calc Af Amer: >60 mL/min (ref >60)
GFR calc non Af Amer: >60 mL/min (ref >60)
Glucose, Bld: 84 mg/dL (ref 70–99)
Potassium: 3 mmol/L — ABNORMAL LOW (ref 3.5–5.1)
Sodium: 134 mmol/L — ABNORMAL LOW (ref 135–145)
Total Bilirubin: 0.8 mg/dL (ref 0.3–1.2)
Total Protein: 5.8 g/dL — ABNORMAL LOW (ref 6.5–8.1)

## 2019-01-26 LAB — CBC
HCT: 29.6 % — ABNORMAL LOW (ref 36.0–46.0)
Hemoglobin: 9.8 g/dL — ABNORMAL LOW (ref 12.0–15.0)
MCH: 32.2 pg (ref 26.0–34.0)
MCHC: 33.1 g/dL (ref 30.0–36.0)
MCV: 97.4 fL (ref 80.0–100.0)
Platelets: 248 10*3/uL (ref 150–400)
RBC: 3.04 MIL/uL — ABNORMAL LOW (ref 3.87–5.11)
RDW: 19.1 % — ABNORMAL HIGH (ref 11.5–15.5)
WBC: 4.3 10*3/uL (ref 4.0–10.5)
nRBC: 0.5 % — ABNORMAL HIGH (ref 0.0–0.2)

## 2019-01-26 LAB — PHOSPHORUS: Phosphorus: 2.5 mg/dL (ref 2.5–4.6)

## 2019-01-26 LAB — MAGNESIUM: Magnesium: 1.7 mg/dL (ref 1.7–2.4)

## 2019-01-26 LAB — GLUCOSE, CAPILLARY: Glucose-Capillary: 83 mg/dL (ref 70–99)

## 2019-01-26 MED ORDER — POTASSIUM CHLORIDE CRYS ER 20 MEQ PO TBCR
40.0000 meq | EXTENDED_RELEASE_TABLET | Freq: Once | ORAL | Status: AC
  Administered 2019-01-26: 08:00:00 40 meq via ORAL
  Filled 2019-01-26: qty 2

## 2019-01-26 MED ORDER — MAGNESIUM SULFATE 2 GM/50ML IV SOLN
2.0000 g | Freq: Once | INTRAVENOUS | Status: AC
  Administered 2019-01-26: 08:00:00 2 g via INTRAVENOUS
  Filled 2019-01-26: qty 50

## 2019-01-26 NOTE — TOC Progression Note (Addendum)
Transition of Care [facility]) - Progression Note    Patient Details  Name: Sydney [name] MRN: [id number] Date of Birth: 10-05-1930  Transition of Care [facility]) CM/SW Contact  Jacalyn Lefevre Edson Snowball, RN Phone Number: 01/26/2019, 12:06 PM  Clinical Narrative:     Patient's daughter spoke with Tommi Rumps with [facility] yesterday.   [name] would like to speak to MD regarding recommendations etc. Dr Sloan Leiter aware and will come to room around 1 pm today. Angela aware.   [name] has questions regarding DME ordered for home . Also would like a price on air mattress for home . Called Zack Blank with [employer] he will call [name] directly to answer questions.   [name] wanting to know what days [place] could visit , she is considering hiring [facility] to see her mother on days [facility] will not be visiting. Ernie Hew with [place] to ask Mateo Flow to call [name] directly.   [name] will provide 24 hour , 7 days a week supervision at home. Also explained if she wanted to she could hire personnel care workers to assist at home. [name] aware and understands NCM does not arrange personnel care aides.   Spoke to [place] with [place]. [facility] will not be able to provide private pay services. [name] aware and voiced understanding.  Expected Discharge Plan: [place] Barriers to Discharge: Continued Medical Work up  Expected Discharge Plan and Services Expected Discharge Plan: [place] In-house Referral: Ehtics Consult, Nutrition, Chaplain Discharge Planning Services: CM Consult Post Acute Care Choice: Durable Medical Equipment, Home Health Living arrangements for the past 2 months: [place]                 DME Arranged: Hospital bed, Wheelchair manual DME Agency: [employer] Date DME Agency Contacted: 01/25/19 Time DME Agency Contacted: [phone] Representative spoke with at DME Agency: zack HH Arranged: RN, PT [place] Agency: [place] Date [place]: 01/25/19 Time [place]: 1129 Representative spoke with at [place]: Tommi Rumps checking on cost of daily RN and PT visits   Social Determinants of Health (SDOH) Interventions    Readmission Risk Interventions No flowsheet data found.

## 2019-01-26 NOTE — Plan of Care (Signed)

## 2019-01-26 NOTE — Progress Notes (Signed)
Physical Therapy Treatment Patient Details Name: Sydney [name] MRN: [id number] DOB: 1931-03-07 Today's Date: 01/26/2019    History of Present Illness Sydney [name] is a 85 y.o. 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 Pt pulled out NG tube 9/25    PT Comments    Pt's daughter present throughout session and attempting to encourage pt to participate in therapy session. However, pt continued to state that she had just woken up and needed time to "get myself situated". Attempted to perform PROM/AAROM of bilateral LEs but pt moaning/crying out with the slightest touch of either LE. Pt then stating that her buttocks was sore and therapist suggested repositioning in bed. Pt agreeable. She continues to require heavy physical assistance of two people for bed mobility. Pt would continue to benefit from skilled physical therapy services at this time while admitted and after d/c to address the below listed limitations in order to improve overall safety and independence with functional mobility.    Follow Up Recommendations  SNF     Equipment Recommendations  Wheelchair (measurements PT);Wheelchair cushion (measurements PT);Hospital bed;Other (comment)(Hoyer Lift)    Recommendations for Other Services       Precautions / Restrictions Precautions Precautions: Fall Restrictions Weight Bearing Restrictions: No    Mobility  Bed Mobility Overal bed mobility: Needs Assistance Bed Mobility: Rolling Rolling: +2 for physical assistance;Max assist         General bed mobility comments: pt resisting movement and refusing to attempt sitting upright at EOB; pt agreeable to roll bilaterally for repositioning in an attempt to make  her feel more comfortable and relief her buttocks pain. Pt able to assist with rolling using bilateral UEs on bed rails  Transfers                    Ambulation/Gait                 Stairs             Wheelchair Mobility    Modified Rankin (Stroke Patients Only)       Balance                                            Cognition Arousal/Alertness: Lethargic Behavior During Therapy: Agitated;Restless Overall Cognitive Status: Impaired/Different from baseline Area of Impairment: Orientation;Attention;Memory;Following commands;Safety/judgement;Awareness;Problem solving                 Orientation Level: Disoriented to;Time;Situation Current Attention Level: Sustained Memory: Decreased recall of precautions;Decreased short-term memory Following Commands: Follows one step commands inconsistently Safety/Judgement: Decreased awareness of safety;Decreased awareness of deficits Awareness: Intellectual Problem Solving: Slow processing;Decreased initiation;Difficulty sequencing;Requires verbal cues;Requires tactile cues        Exercises      General Comments        Pertinent Vitals/Pain Pain Assessment: Faces Faces Pain Scale: Hurts whole lot Pain Location: buttocks, bilateral LEs with movement Pain Descriptors / Indicators: Grimacing Pain Intervention(s): Monitored during session;Repositioned    Home Living                      Prior Function  PT Goals (current goals can now be found in the care plan section) Acute Rehab PT Goals PT Goal Formulation: With family Time For Goal Achievement: 02/02/19 Potential to Achieve Goals: Fair Progress towards PT goals: Not progressing toward goals - comment    Frequency    Min 2X/week      PT Plan Current plan remains appropriate    Co-evaluation              AM-PAC PT "6 Clicks" Mobility   Outcome Measure  Help needed turning from your back  to your side while in a flat bed without using bedrails?: Total Help needed moving from lying on your back to sitting on the side of a flat bed without using bedrails?: Total Help needed moving to and from a bed to a chair (including a wheelchair)?: Total Help needed standing up from a chair using your arms (e.g., wheelchair or bedside chair)?: Total Help needed to walk in hospital room?: Total Help needed climbing 3-5 steps with a railing? : Total 6 Click Score: 6    End of Session   Activity Tolerance: Patient limited by pain Patient left: in bed;with call bell/phone within reach;with family/visitor present Nurse Communication: Mobility status PT Visit Diagnosis: Muscle weakness (generalized) (M62.81);Unsteadiness on feet (R26.81);Adult, failure to thrive (R62.7);Difficulty in walking, not elsewhere classified (R26.2)     Time: [id number] PT Time Calculation (min) (ACUTE ONLY): 19 min  Charges:  $Therapeutic Activity: 8-22 mins                     Sherie Don, PT, DPT  Acute Rehabilitation Services Pager [phone] Office [place] 01/26/2019, 3:03 PM

## 2019-01-26 NOTE — Progress Notes (Signed)
PROGRESS NOTE        PATIENT DETAILS Name: Sydney [name] Age: 85 y.o. Sex: female Date of Birth: Dec 26, 1930 Admit Date: 12/19/2018 Admitting Physician Vianne Bulls, MD [id number], Carlota Raspberry, MD  Brief Narrative: Patient is a 85 y.o. 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  See below for further details.  Subjective: Remains awake and alert-Per nursing staff-patient had only 2 bites of her breakfast this morning.  She also refused some of her medications.  Daughter was not in the room this morning-but this afternoon I had a long conversation with the daughter and case management outside of patient's room..  Assessment/Plan: Progressive generalized weakness associated with failure to thrive syndrome/weight loss: Seems to be slowly improving-she had a good day yesterday-daughter did eat quite a bit-however appetite is again poor today.  Etiology of progressive weakness/weight loss/failure to thrive syndrome remains unknown however TSH/a.m. cortisol/blood cultures all unremarkable.  CT of the abdomen without any obvious malignancy.  MRI brain without any obvious etiology.  Endoscopic evaluation was contemplated but given patient's tenuous clinical state-and suspicion of low yield for diagnoses-not pursued.  Not sure if a transient hepatitis-in a cholestatic pattern (?  Drug-induced hepatitis) could have caused this much of weakness.  It seems like with improvement in LFTs-her mentation/weakness/appetite seems to have improved somewhat.  Patient is overall improved-she is much more awake-and alert.  Yesterday was a good day-but today patient's appetite is very poor.  She did have a NG tube placed a few weeks  back which she removed-daughter is not keen on tube feeding at this point.  Plan is to encourage oral intake-supplement meals with nutritional supplements-and when she is stable with oral intake-we will plan on discharging her home.  If her appetite does not improve over the next few days-we will then have to talk about various options with the patient's daughter.  In the meantime-continue Megace, mirtazapine and supportive care.  I have asked nursing staff to assist with feeding at all times.  Acute metabolic encephalopathy: Etiology uncertain-see above-however she is improved this morning-is hard of hearing but able to answer most of my questions appropriately.  Transaminitis: Mostly in a cholestatic pattern-viral hepatitis serology including CMV/EBV are negative.  Autoimmune hepatitis panel negative.  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  Thankfully LFTs are improved-as noted above-not sure if a drug-induced hepatitis  (?which drug) process can cause this much of encephalopathy (ammonia within normal limits) and severe failure to thrive syndrome.  CVA: Has generalized weakness but nonfocal-continue aspirin-if clinical improvement continues and no further procedures are contemplated-suspect we could resume Plavix soon.  Hypokalemia/hypomagnesemia: We will replete today-recheck tomorrow.  Odynophagia: Improving-continue nystatin.  Thrombocytopenia: Resolved-suspect was secondary to transient hepatitis.  Anemia: Likely secondary to acute illness  HTN: Blood pressure controlled without the use of any antihypertensives.  Deconditioning/debility: Secondary to prolonged hospitalization/acute illness-reconsult PT-probably will require several weeks/months before patient can regain prior functional status.  Daughter is not interested in SNF-she wants to take patient home with maximum home health services.  Nutrition Problem: Nutrition Problem: Severe  Malnutrition Etiology: chronic illness(TIA) Signs/Symptoms: energy intake < or equal to 75% for >  or equal to 1 month, moderate fat depletion, severe fat depletion, moderate muscle depletion, severe muscle depletion Interventions: Tube feeding (now discontinued)   Diet: Diet Order            DIET DYS 3 Room service appropriate? Yes; Fluid consistency: Thin  Diet effective now               DVT Prophylaxis: Prophylactic Lovenox   Code Status: Full code   Family Communication: Daughter outside of patient's room on 10/1 with case manager present.  Disposition Plan: Remain inpatient-daughter refuses SNF-we will maximize home health services on discharge.   Antimicrobial agents: Anti-infectives (From admission, onward)   Start     Dose/Rate Route Frequency Ordered Stop   01/06/19 2200  cephALEXin (KEFLEX) 250 MG/5ML suspension 500 mg     500 mg Per Tube Every 12 hours 01/06/19 1734 01/08/19 2141   01/06/19 1045  ceFAZolin (ANCEF) IVPB 1 g/50 mL premix  Status:  Discontinued     1 g 100 mL/hr over 30 Minutes Intravenous Every 12 hours 01/06/19 1037 01/06/19 1734   01/06/19 1000  vancomycin (VANCOCIN) 50 mg/mL oral solution 125 mg     125 mg Per Tube Every 12 hours 01/06/19 0849 01/08/19 1226   01/05/19 1200  cefTRIAXone (ROCEPHIN) 1 g in sodium chloride 0.9 % 100 mL IVPB  Status:  Discontinued     1 g 200 mL/hr over 30 Minutes Intravenous Every 24 hours 01/05/19 1125 01/06/19 1037      Procedures: None  CONSULTS:  GI and hematology/oncology  Time spent: 25- minutes-Greater than 50% of this time was spent in counseling, explanation of diagnosis, planning of further management, and coordination of care.  MEDICATIONS: Scheduled Meds:  aspirin EC  81 mg Oral Daily   chlorhexidine  15 mL Mouth Rinse BID   enoxaparin (LOVENOX) injection  40 mg Subcutaneous Q24H   feeding supplement  1 Container Oral TID BM   feeding supplement (PRO-STAT SUGAR FREE 64)  30 mL Oral  BID WC   Gerhardt's butt cream   Topical TID   latanoprost  1 drop Both Eyes QHS   mouth rinse  15 mL Mouth Rinse q12n4p   megestrol  400 mg Oral TID   mirtazapine  30 mg Oral QHS   multivitamin with minerals  1 tablet Oral Daily   nystatin  5 mL Oral 5 X Daily   nystatin   Topical BID   pantoprazole  40 mg Oral Daily   sodium chloride flush  3 mL Intravenous Q12H   Continuous Infusions:  sodium chloride     PRN Meds:.sodium chloride, acetaminophen **OR** acetaminophen, guaiFENesin-dextromethorphan, hydroxypropyl methylcellulose / hypromellose, magic mouthwash, [DISCONTINUED] ondansetron **OR** ondansetron (ZOFRAN) IV, sodium chloride flush   PHYSICAL EXAM: Vital signs: Vitals:   01/25/19 1622 01/25/19 2059 01/26/19 0547 01/26/19 1407  BP: 125/73 124/68 123/68 139/72  Pulse: 92 92 93 90  Resp: (!) 22 20 20 18   Temp: 98.8 F (37.1 C) 98.2 F (36.8 C) 98.2 F (36.8 C) 98.4 F (36.9 C)  TempSrc: Oral Oral Oral Oral  SpO2: 95% 99% 97% 97%  Weight:      Height:       Filed Weights   01/21/19 0500 01/24/19 0500 01/25/19 0500  Weight: 60 kg 60.4 kg 60.4 kg   Body mass index is 23.59 kg/m.  Gen Exam:Alert awake-not in any distress-appears very frail and weak.  Chronically sick appearing. HEENT:atraumatic, normocephalic Chest: B/L clear to auscultation anteriorly  CVS:S1S2 regular Abdomen:soft non tender, non distended Extremities:no edema Neurology: Non focal-but has generalized weakness  skin: no rash  I have personally reviewed following labs and imaging studies  LABORATORY DATA: CBC: Recent Labs  Lab 01/24/19 0224 01/26/19 0139  WBC 4.5 4.3  NEUTROABS 3.6  --   HGB 9.2* 9.8*  HCT 28.5* 29.6*  MCV 99.0 97.4  PLT 217 [id number]    Basic Metabolic Panel: Recent Labs  Lab 01/21/19 0534 01/24/19 0224 01/26/19 0139  NA 139 140 134*  K 3.6 3.1* 3.0*  CL 113* 113* 110  CO2 18* 17* 20*  GLUCOSE 72 72 84  BUN 25* 22 16  CREATININE 0.72 0.68 0.73   CALCIUM 7.8* 7.7* 7.7*  MG 1.9  --  1.7  PHOS 3.4  --  2.5    GFR: Estimated Creatinine Clearance: 41.8 mL/min (by C-G formula based on SCr of 0.73 mg/dL).  Liver Function Tests: Recent Labs  Lab 01/21/19 0534 01/24/19 0224 01/26/19 0139  AST 110* 69* 54*  ALT 102* 59* 49*  ALKPHOS 315* 211* 191*  BILITOT 0.8 1.3* 0.8  PROT 5.6* 5.5* 5.8*  ALBUMIN 2.1* 1.9* 1.9*   No results for input(s): LIPASE, AMYLASE in the last 168 hours. No results for input(s): AMMONIA in the last 168 hours.  Coagulation Profile: No results for input(s): INR, PROTIME in the last 168 hours.  Cardiac Enzymes: No results for input(s): CKTOTAL, CKMB, CKMBINDEX, TROPONINI in the last 168 hours.  BNP (last 3 results) No results for input(s): PROBNP in the last 8760 hours.  HbA1C: No results for input(s): HGBA1C in the last 72 hours.  CBG: Recent Labs  Lab 01/24/19 0711 01/24/19 0737 01/25/19 0829 01/25/19 0951 01/26/19 0809  GLUCAP 66* 119* 69* 94 83    Lipid Profile: No results for input(s): CHOL, HDL, LDLCALC, TRIG, CHOLHDL, LDLDIRECT in the last 72 hours.  Thyroid Function Tests: No results for input(s): TSH, T4TOTAL, FREET4, T3FREE, THYROIDAB in the last 72 hours.  Anemia Panel: No results for input(s): VITAMINB12, FOLATE, FERRITIN, TIBC, IRON, RETICCTPCT in the last 72 hours.  Urine analysis:    Component Value Date/Time   COLORURINE YELLOW 01/08/2019 2015   APPEARANCEUR HAZY (A) 01/08/2019 2015   LABSPEC 1.018 01/08/2019 2015   PHURINE 6.0 01/08/2019 2015   GLUCOSEU NEGATIVE 01/08/2019 2015   HGBUR SMALL (A) 01/08/2019 2015   BILIRUBINUR NEGATIVE 01/08/2019 2015   KETONESUR NEGATIVE 01/08/2019 2015   PROTEINUR NEGATIVE 01/08/2019 2015   UROBILINOGEN 0.2 03/11/202014 1048   NITRITE NEGATIVE 01/08/2019 2015   LEUKOCYTESUR NEGATIVE 01/08/2019 2015    Sepsis Labs: Lactic Acid, Venous    Component Value Date/Time   LATICACIDVEN 1.4 12/20/2018 0012    MICROBIOLOGY: No  results found for this or any previous visit (from the past 240 hour(s)).  RADIOLOGY STUDIES/RESULTS: Dg Abd 1 View  Result Date: 01/17/2019 CLINICAL DATA:  85 year old female with a history of enteric tube placement EXAM: ABDOMEN - 1 VIEW; DG NASO G TUBE PLC W/FL-NO RAD COMPARISON:  January 13, 2019 FINDINGS: Limited plain film demonstrates post pyloric tube terminating in the 3/4 portion of the duodenum. IMPRESSION: Post pyloric feeding tube terminates in the 3/4 portion the duodenum. Electronically Signed   By: Corrie Mckusick D.O.   On: 01/17/2019 15:55   Dg Abd 1 View  Result Date: 01/13/2019 CLINICAL DATA:  Blocked feeding tube. EXAM: ABDOMEN - 1 VIEW COMPARISON:  12/29/2018 FINDINGS: Enteric fusion tube is present with tip over the region of the  distal stomach just right of midline in the mid abdomen. Bowel gas pattern is nonobstructive. Remainder of the exam is unchanged. IMPRESSION: Nonobstructive bowel gas pattern. Enteric feeding tube with tip right of midline in the mid abdomen likely over the distal stomach. Electronically Signed   By: Marin Olp M.D.   On: 01/13/2019 12:28   Dg Abd 1 View  Result Date: 12/29/2018 CLINICAL DATA:  Generalized abdominal pain EXAM: ABDOMEN - 1 VIEW COMPARISON:  12/19/2018 FINDINGS: Small bilateral pleural effusions. Mild diffuse gaseous dilatation of bowel without definitive obstruction. Coarse calcifications in the pelvis as before. IMPRESSION: 1. Small bilateral pleural effusions 2. Mild diffuse gaseous prominence of bowel without obstructive pattern. Findings could be secondary to mild ileus or enteritis. Electronically Signed   By: Donavan Foil M.D.   On: 12/29/2018 20:31   Ct Abdomen Pelvis W Contrast  Result Date: 01/06/2019 CLINICAL DATA:  Abdominal pain EXAM: CT ABDOMEN AND PELVIS WITH CONTRAST TECHNIQUE: Multidetector CT imaging of the abdomen and pelvis was performed using the standard protocol following bolus administration of intravenous  contrast. CONTRAST:  150mL OMNIPAQUE IOHEXOL 300 MG/ML  SOLN COMPARISON:  CT December 20, 2018 FINDINGS: Lower chest: Moderate bilateral effusions with adjacent areas of passive atelectasis. Mild airways thickening is present. Normal heart size. No pericardial effusion. Hepatobiliary: Stable 11 mm hepatic cyst. No concerning liver abnormality is seen. No calcified gallstones, gallbladder wall thickening or biliary ductal dilatation. Pancreas: Unremarkable. No pancreatic ductal dilatation or surrounding inflammatory changes. Spleen: Diminutive spleen. Otherwise unremarkable. Adrenals/Urinary Tract: Adrenal glands are unremarkable. Kidneys are normal, without renal calculi, focal lesion, or hydronephrosis. Urinary bladder is distended but otherwise unremarkable. Stomach/Bowel: Transesophageal tube tip terminates at the level of the gastric antrum. There is edematous mural thickening of much of the small bowel and more focally pronounced in the cecum and ascending colon. Distal colon assumes a less thickened appearance. The appendix is not well visualized. Vascular/Lymphatic: Atherosclerotic plaque within the normal caliber aorta. Portal and hepatic veins are patent. Venous engorgement of the gonadal veins. No suspicious or enlarged lymph nodes in the included lymphatic chains. Reproductive: Parametrial calcifications and engorged gonadal veins, as above. Few scattered calcified pelvic fibroids within a retroverted uterus. No discernible adnexal lesions. Other: Moderate volume of free intraperitoneal ascites with diffusely increased attenuation of the mesenteric leaflets and fluid tracking and bilateral inguinal hernias. Extensive circumferential body wall edema is present as well. As well as milder edematous changes in the soft tissues of the imaged extremities. No free intraperitoneal air Musculoskeletal: Degenerative changes present in both wrists. Multilevel degenerative changes are present in the imaged portions of  the spine. Disc bulges present at L3-4 and L4-5. Vacuum disc noted at L3-4 as well. IMPRESSION: 1. Moderate volume of free intraperitoneal ascites, bilateral effusions, circumferential body wall and extremity edema compatible with anasarca. 2. Edematous mural thickening of much of the small bowel, more focally pronounced in the cecum and ascending colon, possibly reflective of the patient's fluid status though cannot exclude underlying enterocolitis. Correlate with symptoms. 3. Venous engorgement of the gonadal veins, which can be seen with pelvic congestion syndrome. 4. Retroverted fibroid uterus. 5. Bilateral fluid-filled inguinal hernias 6. Aortic Atherosclerosis (ICD10-I70.0). Electronically Signed   By: Lovena Le M.D.   On: 01/06/2019 21:13   Dg Chest Port 1 View  Result Date: 01/23/2019 CLINICAL DATA:  Cough. EXAM: PORTABLE CHEST 1 VIEW COMPARISON:  Radiograph 01/13/2019 FINDINGS: Enteric tube has been removed. Lower lung volumes from prior. Patient is rotated. Cardiomegaly  is more prominent than on prior exam. Unchanged mediastinal contours with aortic atherosclerosis. Moderate bilateral pleural effusions and associated bibasilar opacities, left greater than right. No evidence of pulmonary edema. No confluent airspace disease. No pneumothorax. IMPRESSION: 1. Moderate bilateral pleural effusions with associated bibasilar opacities, left greater than right, likely atelectasis. 2. Cardiomegaly is more prominent than on prior exam. Aortic Atherosclerosis (ICD10-I70.0). Electronically Signed   By: Keith Rake M.D.   On: 01/23/2019 19:30   Dg Chest Port 1 View  Result Date: 01/13/2019 CLINICAL DATA:  Patient is spitting up 2 feeds. Possible aspiration. EXAM: PORTABLE CHEST 1 VIEW COMPARISON:  Single-view of the chest 12/16/2018. FINDINGS: Feeding tube courses into the stomach and below the inferior margin of the film. The patient has moderate bilateral pleural effusions. Mild basilar atelectasis  noted. There is cardiomegaly without edema. No pneumothorax. No acute or focal bony abnormality. IMPRESSION: Moderate layering pleural effusions with mild bibasilar atelectasis. Cardiomegaly without edema. Electronically Signed   By: Inge Rise M.D.   On: 01/13/2019 09:47   Dg Addison Bailey G Tube Plc W/fl-no Rad  Result Date: 01/17/2019 CLINICAL DATA:  85 year old female with a history of enteric tube placement EXAM: ABDOMEN - 1 VIEW; DG NASO G TUBE PLC W/FL-NO RAD COMPARISON:  January 13, 2019 FINDINGS: Limited plain film demonstrates post pyloric tube terminating in the 3/4 portion of the duodenum. IMPRESSION: Post pyloric feeding tube terminates in the 3/4 portion the duodenum. Electronically Signed   By: Corrie Mckusick D.O.   On: 01/17/2019 15:55   US Liver Doppler  Result Date: 01/13/2019 CLINICAL DATA:  85 year old female with elevated liver enzymes. Evaluate for portal vein stenosis/occlusion. EXAM: DUPLEX ULTRASOUND OF LIVER TECHNIQUE: Color and duplex Doppler ultrasound was performed to evaluate the hepatic in-flow and out-flow vessels. COMPARISON:  Prior CT abdomen/pelvis 01/06/2019 FINDINGS: Liver: Normal parenchymal echogenicity. Normal hepatic contour without nodularity. No focal lesion, mass or intrahepatic biliary ductal dilatation. Portal Vein Velocities Main:  35 cm/sec with normal hepatopetal flow. Right:  23 cm/sec with normal hepatopetal flow. Left:  27 cm/sec with normal hepatopetal flow. Hepatic Vein Velocities Right:  16 cm/sec Middle:  27 cm/sec Left:  23 cm/sec IVC: Present and patent with normal respiratory phasicity. Hepatic Artery Velocity:  102 cm/sec Splenic Vein Velocity:  31 cm/sec Varices: Absent Ascites: Small volume ascites. Other: Bilateral pleural effusions are noted incidentally. IMPRESSION: 1. Patent and normal appearing portal and hepatic veins. 2. Small volume ascites. 3. Bilateral moderate pleural effusions. 4. Normal sonographic appearance of the liver.  Electronically Signed   By: Jacqulynn Cadet M.D.   On: 01/13/2019 16:00     LOS: 37 days   Oren Binet, MD  [facility]  If 7PM-7AM, please contact night-coverage  Please page via [email or web address]  Go to [email or web address] and use [facility]'s universal password to access. If you do not have the password, please contact the hospital operator.  Locate the [facility] provider you are looking for under [facility] and page to a number that you can be directly reached. If you still have difficulty reaching the provider, please page the [facility] (Director on Call) for the Hospitalists listed on amion for assistance.  01/26/2019, 2:30 PM

## 2019-01-27 DIAGNOSIS — R531 Weakness: Secondary | ICD-10-CM | POA: Diagnosis not present

## 2019-01-27 DIAGNOSIS — R627 Adult failure to thrive: Secondary | ICD-10-CM | POA: Diagnosis not present

## 2019-01-27 DIAGNOSIS — R7989 Other specified abnormal findings of blood chemistry: Secondary | ICD-10-CM

## 2019-01-27 DIAGNOSIS — Z8673 Personal history of transient ischemic attack (TIA), and cerebral infarction without residual deficits: Secondary | ICD-10-CM | POA: Diagnosis not present

## 2019-01-27 LAB — HEPATIC FUNCTION PANEL
ALT: 43 U/L (ref 0–44)
AST: 47 U/L — ABNORMAL HIGH (ref 15–41)
Albumin: 1.9 g/dL — ABNORMAL LOW (ref 3.5–5.0)
Alkaline Phosphatase: 186 U/L — ABNORMAL HIGH (ref 38–126)
Bilirubin, Direct: 0.3 mg/dL — ABNORMAL HIGH (ref 0.0–0.2)
Indirect Bilirubin: 0.5 mg/dL (ref 0.3–0.9)
Total Bilirubin: 0.8 mg/dL (ref 0.3–1.2)
Total Protein: 5.4 g/dL — ABNORMAL LOW (ref 6.5–8.1)

## 2019-01-27 LAB — GLUCOSE, CAPILLARY
Glucose-Capillary: 60 mg/dL — ABNORMAL LOW (ref 70–99)
Glucose-Capillary: 108 mg/dL — ABNORMAL HIGH (ref 70–99)

## 2019-01-27 LAB — BASIC METABOLIC PANEL
Anion gap: 9 (ref 5–15)
BUN: 13 mg/dL (ref 8–23)
CO2: 17 mmol/L — ABNORMAL LOW (ref 22–32)
Calcium: 7.7 mg/dL — ABNORMAL LOW (ref 8.9–10.3)
Chloride: 110 mmol/L (ref 98–111)
Creatinine, Ser: 0.7 mg/dL (ref 0.44–1.00)
GFR calc Af Amer: >60 mL/min (ref >60)
GFR calc non Af Amer: >60 mL/min (ref >60)
Glucose, Bld: 69 mg/dL — ABNORMAL LOW (ref 70–99)
Potassium: 3.7 mmol/L (ref 3.5–5.1)
Sodium: 136 mmol/L (ref 135–145)

## 2019-01-27 LAB — MAGNESIUM: Magnesium: 2.1 mg/dL (ref 1.7–2.4)

## 2019-01-27 MED ORDER — DEXTROSE 50 % IV SOLN
INTRAVENOUS | Status: AC
  Administered 2019-01-27: 08:00:00 50 mL
  Filled 2019-01-27: qty 50

## 2019-01-27 NOTE — Progress Notes (Signed)
Pt requested to removed her boots and refused to eat her breakfast.

## 2019-01-27 NOTE — Progress Notes (Signed)
CRITICAL VALUE ALERT  Critical Value:  cbg 60  Date & Time Notied:  01/27/2019 at 0800  Provider Notified: Dr. Sloan Leiter  Orders Received/Actions taken: given d50 rechecked [id number], pt alert and responsive.

## 2019-01-27 NOTE — Evaluation (Addendum)
Occupational Therapy Evaluation Patient Details Name: Sydney [name] MRN: [id number] DOB: May 31, 1930 Today's Date: 01/27/2019    History of Present Illness Sydney [name] is a 85 y.o. 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 Pt pulled out NG tube 9/25   Clinical Impression   PT admitted with extended stay see above. Pt currently with functional limitiations due to the deficits listed below (see OT problem list). Pt with bed level participation but limited due to cognitive deficits and fatigue. Pt progressed to EOB and needs distractions to sustain EOB sitting. Pt will need air matress overlay for d/c and lift with maximized caregivers.  Pt will benefit from skilled OT to increase their independence and safety with adls and balance to allow discharge SNF but daughter declines for home d/c.  Daughter was educated that if patient declines x3 times or does not show participation that therapy will discharge from services. Pt is very weak and unaware of current admission location/ needs.      Follow Up Recommendations  Home health OT    Equipment Recommendations  Wheelchair cushion (measurements OT);Wheelchair (measurements OT);Hospital bed;Other (comment)(lift- air mattress overlay)    Recommendations for Other Services Other (comment)(palliative for home care need for hospice level DME)     Precautions / Restrictions Precautions Precautions: Fall      Mobility Bed Mobility Overal bed mobility: Needs Assistance Bed Mobility: Rolling Rolling: Mod assist         General bed mobility comments: pt long sitting in bed total +2 max (A) and then pivot to eob sitting total +2 mod (A) .   Transfers                  General transfer comment: unable to complete.     Balance                                           ADL either performed or assessed with clinical judgement   ADL Overall ADL's : Needs assistance/impaired Eating/Feeding: Minimal assistance;Bed level Eating/Feeding Details (indicate cue type and reason): daughter helping support cup for gatorade                          Toileting- Clothing Manipulation and Hygiene: Total assistance Toileting - Clothing Manipulation Details (indicate cue type and reason): bed level care for incontinence of stool. pt iwth loose stool and no awareness. pt 's daughter educated to check her everytime she is awake for incontinence to decr for skin break down        General ADL Comments: pt initiated participation with bed mobility motivated due to incontinence and sitting eOB with support     Vision Baseline Vision/History: Wears glasses Wears Glasses: At all times       Perception     Praxis      Pertinent Vitals/Pain Pain Assessment: Faces Faces Pain Scale: Hurts little more Pain Location: buttock Pain Descriptors / Indicators: Grimacing Pain Intervention(s): Monitored during session;Premedicated before session;Repositioned     Hand Dominance Right   Extremity/Trunk Assessment Upper Extremity Assessment Upper Extremity Assessment: Generalized weakness   Lower Extremity Assessment Lower Extremity Assessment:  Defer to PT evaluation   Cervical / Trunk Assessment Cervical / Trunk Assessment: Kyphotic   Communication Communication Communication: HOH   Cognition Arousal/Alertness: Awake/alert Behavior During Therapy: Flat affect Overall Cognitive Status: Impaired/Different from baseline                                 General Comments: pt reports "put me back in the bed " pt unaware she is in the bed because she is in chair position. Pt states "come back in the day time" pt unaware even  looking at the window with day light that its day time. pt oriented sevearl times during sesison that is day time hours. pt reports it is august and after education states its "august" in a way to indicate that she does not believe it to be October currently.    General Comments       Exercises     Shoulder Instructions      Home Living Family/patient expects to be discharged to:: Private residence                                        Prior Functioning/Environment Level of Independence: Needs assistance  Gait / Transfers Assistance Needed: prior to past month was independent with no AD and walked with daughter daily. Daughter reports is now shuffling with RW and supervision. Transferring to bedside commode at start of this admission. ADL's / Homemaking Assistance Needed: was independent but is no longer cooking because of fatigue and needs some assist with ADL's            OT Problem List: Decreased strength;Decreased activity tolerance;Impaired balance (sitting and/or standing);Decreased cognition;Decreased safety awareness;Pain;Cardiopulmonary status limiting activity      OT Treatment/Interventions: Self-care/ADL training;Therapeutic exercise;Therapeutic activities;Balance training;Patient/family education;Cognitive remediation/compensation    OT Goals(Current goals can be found in the care plan section) Acute Rehab OT Goals Patient Stated Goal: daughter wants therapy maximized for recovery  OT Goal Formulation: Patient unable to participate in goal setting Time For Goal Achievement: 02/10/19 Potential to Achieve Goals: Poor  OT Frequency: Min 1X/week   Barriers to D/C:    daughter asking questions about nutrition to give mother and unaware of care needs. encourage staff on having daughter complete basic needs to educate her       Co-evaluation              AM-PAC OT "6 Clicks" Daily Activity     Outcome Measure Help from another person eating  meals?: Total Help from another person taking care of personal grooming?: Total Help from another person toileting, which includes using toliet, bedpan, or urinal?: Total Help from another person bathing (including washing, rinsing, drying)?: Total Help from another person to put on and taking off regular upper body clothing?: Total Help from another person to put on and taking off regular lower body clothing?: Total 6 Click Score: 6   End of Session Nurse Communication: Mobility status;Precautions  Activity Tolerance: Patient tolerated treatment well Patient left: in bed;with call bell/phone within reach  OT Visit Diagnosis: Unsteadiness on feet (R26.81);Muscle weakness (generalized) (M62.81)                Time: 1515-1600 OT Time Calculation (min): 45 min Charges:  OT General Charges $OT Visit: 1 Visit OT Evaluation $OT Eval Moderate Complexity: 1 Mod  OT Treatments $Self Care/Home Management : 8-22 mins   Jeri Modena, OTR/L  Acute Rehabilitation Services Pager: [phone] Office: [phone] .   Jeri Modena 01/27/2019, 4:34 PM

## 2019-01-27 NOTE — Progress Notes (Signed)
Pt refused to take her med, she had bowel movement,changed the pad and her gown, ADLs done.

## 2019-01-27 NOTE — Progress Notes (Addendum)
Nutrition Follow-up  DOCUMENTATION CODES:   Severe malnutrition in context of chronic illness  INTERVENTION:   -Initiate 48 calorie count per MD request; RD will follow-up on Monday, 01/30/19 for results -Continue Boost Breeze po TID, each supplement provides 250 kcal and 9 grams of protein -Continue MVI daily -Continue 30 ml Prostat BID, each supplement provides 100 kcals and 15 grams of protein -Continue Magic cup TID with meals, each supplement provides 290 kcal and 9 grams of protein  NUTRITION DIAGNOSIS:   Severe Malnutrition related to chronic illness(TIA) as evidenced by energy intake < or equal to 75% for > or equal to 1 month, moderate fat depletion, severe fat depletion, moderate muscle depletion, severe muscle depletion.  Ongoing  GOAL:   Patient will meet greater than or equal to 90% of their needs  Progressing   MONITOR:   PO intake, Supplement acceptance, Diet advancement, Labs, Weight trends, TF tolerance, Skin, I & O's  REASON FOR ASSESSMENT:   Consult Calorie Count  ASSESSMENT:   Sydney [name] is a 85 y.o. 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  Patient is accompanied by her daughter who assists with the history.  Patient has history of recurrent C. difficile colitis, has continued to have loose stools, though this has improved to 4 or 5/day, has had progressively worsening appetite, eating only 3 or 4 bites and unable to even drink half of a nutritional supplement drink.  Patient denies any abdominal pain since the recent hospitalization, no fevers or chills have been noted, and she denies any dysuria though she may have complained of this a few days ago.  She was requiring assistance with  ambulation and had home health PT set up at recent discharge, but has become even more weak in general since leaving the hospital and had a near syncopal episode today when she tried to get up from a bedside commode.  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 Pt with very minimal intake during that time period. Pt was consuming bites and sips; RN reported pt was consuming 5% of meals at best. Pt unable to meet nutritional needs via PO route alone during this time period.   RD reviewed meal completion records over the past 48 hours. Pt intake slightly improved, however, remains minimal and inadequate. Pt generally consuming bites and sips at meals, as well as supplements. Estimated calorie intake within the past 48 hours is 812 kcals and 53 grams of protein (assuming pt consumed 100% of Prostat within 48 hour period) is 65% of estimated kcal needs and 88% of estimated protein needs.   Case discussed with RN, who has taken care of  pt in the past during this admission and reports very minimal overall improvement. RN reports that pt refused breakfast and medications this AM. RD remains doubtful that pt will be able to consume adequate nutrition via PO route and will likely require nutrition support if aggressive care is still warranted. Pt and daughter do not desire cortrak  placement and is not a PEG candidate.   Pt daughter still desires full aggressive care at this time. Ethics committee following.   Labs reviewed: CBGS: 60-108.   Diet Order:   Diet Order            DIET DYS 3 Room service appropriate? 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  Estimated Nutritional Needs:   Kcal:  1250-1450  Protein:  60-75 grams  Fluid:  > 1.2 L    [name] A. Jimmye Norman, RD, LDN, [place] Registered Dietitian II Certified Diabetes Care and Education Specialist Pager: [phone] After hours Pager: [phone]

## 2019-01-27 NOTE — Progress Notes (Signed)
PROGRESS NOTE        PATIENT DETAILS Name: Sydney [name] Age: 85 y.o. Sex: female Date of Birth: 12/01/1930 Admit Date: 12/19/2018 Admitting Physician Vianne Bulls, MD [id number], Carlota Raspberry, MD  Brief Narrative: Patient is a 85 y.o. 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  See below for further details.  Subjective: Per daughter-patient did have some amount of food for lunch yesterday.  Per nursing staff-patient did not eat breakfast or agreed to take some of her medications this morning.  She appears the same-very weak and debilitated.  Assessment/Plan: Progressive generalized weakness associated with failure to thrive syndrome/weight loss: Seems to be slowly improving-exhaustive work-up to date remains negative.  Her appetite seems to wax and wane-suspect she will benefit from a calorie count before consideration of discharge.   Etiology of progressive weakness/weight loss/failure to thrive syndrome remains unknown however TSH/a.m. cortisol/blood cultures all unremarkable.  CT of the abdomen without any obvious malignancy.  MRI brain without any obvious etiology.  Endoscopic evaluation was contemplated but given patient's tenuous clinical state-and suspicion of low yield for diagnoses-not pursued.  Not sure if a transient hepatitis-in a cholestatic pattern (?  Drug-induced hepatitis) could have caused this much of weakness.  It seems like with improvement in LFTs-her mentation/appetite seems to have improved somewhat.  Appreciate Dr. Celesta Aver note from today-I will review chart with neurology to see if a diagnosis of myositis could be entertained although I suspect it really does not fit patient's  clinical picture.   In the meantime-continue Megace, mirtazapine and supportive care.  I have asked nursing staff to assist with feeding at all times.  We will order a calorie count-however if patient's oral intake is inadequate-he will need to talk with the patient's daughter-although she has told me she really does not want to pursue either the NG tube or a PEG tube.  Palliative care notes-daughter has refused to meet with the palliative care team.  Acute metabolic encephalopathy: Etiology uncertain-see above-however she is improved this morning-is hard of hearing but able to answer most of my questions appropriately.  Transaminitis: Mostly in a cholestatic pattern-viral hepatitis serology including CMV/EBV are negative.  Autoimmune hepatitis panel negative.  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  Thankfully LFTs are improved-as noted above-not sure if a drug-induced hepatitis  (?which drug) process can cause this much of encephalopathy (ammonia within normal limits) and severe failure to thrive syndrome.  CVA: Has generalized weakness but nonfocal-continue aspirin-if clinical improvement continues and no further procedures are contemplated-suspect we could resume Plavix soon.  Hypokalemia/hypomagnesemia: Repleted  Odynophagia: Improving-continue nystatin.  Thrombocytopenia: Resolved-suspect was secondary to transient hepatitis.  Anemia: Likely secondary to acute illness  HTN: Blood pressure controlled without the use of any antihypertensives.  Deconditioning/debility: Secondary to prolonged hospitalization/acute illness-reconsult PT-probably will require several weeks/months before patient can regain prior functional status.  Daughter is not interested in SNF-she wants to take patient home with maximum home health services.  Nutrition Problem: Nutrition Problem: Severe Malnutrition Etiology: chronic illness(TIA) Signs/Symptoms: energy intake < or equal  to 75% for > or equal to 1 month, moderate fat depletion, severe fat depletion, moderate  muscle depletion, severe muscle depletion Interventions: Tube feeding (now discontinued)  Sacral decubitus ulcer stage II: Continue wound care-we will ask wound care nurse to evaluate.  Diet: Diet Order            DIET DYS 3 Room service appropriate? Yes; Fluid consistency: Thin  Diet effective now               DVT Prophylaxis: Prophylactic Lovenox   Code Status: Full code   Family Communication: Daughter at bedside on 10/2.  Disposition Plan: Remain inpatient-daughter refuses SNF-we will maximize home health services on discharge.   Antimicrobial agents: Anti-infectives (From admission, onward)   Start     Dose/Rate Route Frequency Ordered Stop   01/06/19 2200  cephALEXin (KEFLEX) 250 MG/5ML suspension 500 mg     500 mg Per Tube Every 12 hours 01/06/19 1734 01/08/19 2141   01/06/19 1045  ceFAZolin (ANCEF) IVPB 1 g/50 mL premix  Status:  Discontinued     1 g 100 mL/hr over 30 Minutes Intravenous Every 12 hours 01/06/19 1037 01/06/19 1734   01/06/19 1000  vancomycin (VANCOCIN) 50 mg/mL oral solution 125 mg     125 mg Per Tube Every 12 hours 01/06/19 0849 01/08/19 1226   01/05/19 1200  cefTRIAXone (ROCEPHIN) 1 g in sodium chloride 0.9 % 100 mL IVPB  Status:  Discontinued     1 g 200 mL/hr over 30 Minutes Intravenous Every 24 hours 01/05/19 1125 01/06/19 1037      Procedures: None  CONSULTS:  GI and hematology/oncology  Time spent: 25- minutes-Greater than 50% of this time was spent in counseling, explanation of diagnosis, planning of further management, and coordination of care.  MEDICATIONS: Scheduled Meds:  aspirin EC  81 mg Oral Daily   chlorhexidine  15 mL Mouth Rinse BID   enoxaparin (LOVENOX) injection  40 mg Subcutaneous Q24H   feeding supplement  1 Container Oral TID BM   feeding supplement (PRO-STAT SUGAR FREE 64)  30 mL Oral BID WC   Gerhardt's butt cream    Topical TID   latanoprost  1 drop Both Eyes QHS   mouth rinse  15 mL Mouth Rinse q12n4p   megestrol  400 mg Oral TID   mirtazapine  30 mg Oral QHS   multivitamin with minerals  1 tablet Oral Daily   nystatin  5 mL Oral 5 X Daily   nystatin   Topical BID   pantoprazole  40 mg Oral Daily   sodium chloride flush  3 mL Intravenous Q12H   Continuous Infusions:  sodium chloride     PRN Meds:.sodium chloride, acetaminophen **OR** acetaminophen, guaiFENesin-dextromethorphan, hydroxypropyl methylcellulose / hypromellose, magic mouthwash, [DISCONTINUED] ondansetron **OR** ondansetron (ZOFRAN) IV, sodium chloride flush   PHYSICAL EXAM: Vital signs: Vitals:   01/26/19 1407 01/26/19 2038 01/27/19 0539 01/27/19 1426  BP: 139/72 131/78 (!) 144/90 (!) 142/77  Pulse: 90 98 95 89  Resp: 18  19 17   Temp: 98.4 F (36.9 C) 99.5 F (37.5 C) 98.6 F (37 C) 98.2 F (36.8 C)  TempSrc: Oral Oral  Oral  SpO2: 97% 98% 99% 98%  Weight:      Height:       Filed Weights   01/21/19 0500 01/24/19 0500 01/25/19 0500  Weight: 60 kg 60.4 kg 60.4 kg   Body mass index is 23.59 kg/m.   Gen Exam:Alert awake-not in any distress.  But very chronically sick appearing. HEENT:atraumatic, normocephalic Chest: B/L clear to auscultation anteriorly CVS:S1S2 regular Abdomen:soft  non tender, non distended Extremities:no edema Neurology: Non focal-but with generalized weakness. Skin: no rash  I have personally reviewed following labs and imaging studies  LABORATORY DATA: CBC: Recent Labs  Lab 01/24/19 0224 01/26/19 0139  WBC 4.5 4.3  NEUTROABS 3.6  --   HGB 9.2* 9.8*  HCT 28.5* 29.6*  MCV 99.0 97.4  PLT 217 [id number]    Basic Metabolic Panel: Recent Labs  Lab 01/21/19 0534 01/24/19 0224 01/26/19 0139 01/27/19 0525  NA 139 140 134* 136  K 3.6 3.1* 3.0* 3.7  CL 113* 113* 110 110  CO2 18* 17* 20* 17*  GLUCOSE 72 72 84 69*  BUN 25* 22 16 13   CREATININE 0.72 0.68 0.73 0.70  CALCIUM 7.8*  7.7* 7.7* 7.7*  MG 1.9  --  1.7 2.1  PHOS 3.4  --  2.5  --     GFR: Estimated Creatinine Clearance: 41.8 mL/min (by C-G formula based on SCr of 0.7 mg/dL).  Liver Function Tests: Recent Labs  Lab 01/21/19 0534 01/24/19 0224 01/26/19 0139 01/27/19 0525  AST 110* 69* 54* 47*  ALT 102* 59* 49* 43  ALKPHOS 315* 211* 191* 186*  BILITOT 0.8 1.3* 0.8 0.8  PROT 5.6* 5.5* 5.8* 5.4*  ALBUMIN 2.1* 1.9* 1.9* 1.9*   No results for input(s): LIPASE, AMYLASE in the last 168 hours. No results for input(s): AMMONIA in the last 168 hours.  Coagulation Profile: No results for input(s): INR, PROTIME in the last 168 hours.  Cardiac Enzymes: No results for input(s): CKTOTAL, CKMB, CKMBINDEX, TROPONINI in the last 168 hours.  BNP (last 3 results) No results for input(s): PROBNP in the last 8760 hours.  HbA1C: No results for input(s): HGBA1C in the last 72 hours.  CBG: Recent Labs  Lab 01/25/19 0829 01/25/19 0951 01/26/19 0809 01/27/19 0807 01/27/19 0858  GLUCAP 69* 94 83 60* 108*    Lipid Profile: No results for input(s): CHOL, HDL, LDLCALC, TRIG, CHOLHDL, LDLDIRECT in the last 72 hours.  Thyroid Function Tests: No results for input(s): TSH, T4TOTAL, FREET4, T3FREE, THYROIDAB in the last 72 hours.  Anemia Panel: No results for input(s): VITAMINB12, FOLATE, FERRITIN, TIBC, IRON, RETICCTPCT in the last 72 hours.  Urine analysis:    Component Value Date/Time   COLORURINE YELLOW 01/08/2019 2015   APPEARANCEUR HAZY (A) 01/08/2019 2015   LABSPEC 1.018 01/08/2019 2015   PHURINE 6.0 01/08/2019 2015   GLUCOSEU NEGATIVE 01/08/2019 2015   HGBUR SMALL (A) 01/08/2019 2015   BILIRUBINUR NEGATIVE 01/08/2019 2015   KETONESUR NEGATIVE 01/08/2019 2015   PROTEINUR NEGATIVE 01/08/2019 2015   UROBILINOGEN 0.2 02/11/2013 1048   NITRITE NEGATIVE 01/08/2019 2015   LEUKOCYTESUR NEGATIVE 01/08/2019 2015    Sepsis Labs: Lactic Acid, Venous    Component Value Date/Time   LATICACIDVEN 1.4  12/20/2018 0012    MICROBIOLOGY: No results found for this or any previous visit (from the past 240 hour(s)).  RADIOLOGY STUDIES/RESULTS: Dg Abd 1 View  Result Date: 01/17/2019 CLINICAL DATA:  85 year old female with a history of enteric tube placement EXAM: ABDOMEN - 1 VIEW; DG NASO G TUBE PLC W/FL-NO RAD COMPARISON:  January 13, 2019 FINDINGS: Limited plain film demonstrates post pyloric tube terminating in the 3/4 portion of the duodenum. IMPRESSION: Post pyloric feeding tube terminates in the 3/4 portion the duodenum. Electronically Signed   By: Corrie Mckusick D.O.   On: 01/17/2019 15:55   Dg Abd 1 View  Result Date: 01/13/2019 CLINICAL DATA:  Blocked feeding tube. EXAM: ABDOMEN -  1 VIEW COMPARISON:  12/29/2018 FINDINGS: Enteric fusion tube is present with tip over the region of the distal stomach just right of midline in the mid abdomen. Bowel gas pattern is nonobstructive. Remainder of the exam is unchanged. IMPRESSION: Nonobstructive bowel gas pattern. Enteric feeding tube with tip right of midline in the mid abdomen likely over the distal stomach. Electronically Signed   By: Marin Olp M.D.   On: 01/13/2019 12:28   Dg Abd 1 View  Result Date: 12/29/2018 CLINICAL DATA:  Generalized abdominal pain EXAM: ABDOMEN - 1 VIEW COMPARISON:  12/19/2018 FINDINGS: Small bilateral pleural effusions. Mild diffuse gaseous dilatation of bowel without definitive obstruction. Coarse calcifications in the pelvis as before. IMPRESSION: 1. Small bilateral pleural effusions 2. Mild diffuse gaseous prominence of bowel without obstructive pattern. Findings could be secondary to mild ileus or enteritis. Electronically Signed   By: Donavan Foil M.D.   On: 12/29/2018 20:31   Ct Abdomen Pelvis W Contrast  Result Date: 01/06/2019 CLINICAL DATA:  Abdominal pain EXAM: CT ABDOMEN AND PELVIS WITH CONTRAST TECHNIQUE: Multidetector CT imaging of the abdomen and pelvis was performed using the standard protocol following  bolus administration of intravenous contrast. CONTRAST:  167mL OMNIPAQUE IOHEXOL 300 MG/ML  SOLN COMPARISON:  CT December 20, 2018 FINDINGS: Lower chest: Moderate bilateral effusions with adjacent areas of passive atelectasis. Mild airways thickening is present. Normal heart size. No pericardial effusion. Hepatobiliary: Stable 11 mm hepatic cyst. No concerning liver abnormality is seen. No calcified gallstones, gallbladder wall thickening or biliary ductal dilatation. Pancreas: Unremarkable. No pancreatic ductal dilatation or surrounding inflammatory changes. Spleen: Diminutive spleen. Otherwise unremarkable. Adrenals/Urinary Tract: Adrenal glands are unremarkable. Kidneys are normal, without renal calculi, focal lesion, or hydronephrosis. Urinary bladder is distended but otherwise unremarkable. Stomach/Bowel: Transesophageal tube tip terminates at the level of the gastric antrum. There is edematous mural thickening of much of the small bowel and more focally pronounced in the cecum and ascending colon. Distal colon assumes a less thickened appearance. The appendix is not well visualized. Vascular/Lymphatic: Atherosclerotic plaque within the normal caliber aorta. Portal and hepatic veins are patent. Venous engorgement of the gonadal veins. No suspicious or enlarged lymph nodes in the included lymphatic chains. Reproductive: Parametrial calcifications and engorged gonadal veins, as above. Few scattered calcified pelvic fibroids within a retroverted uterus. No discernible adnexal lesions. Other: Moderate volume of free intraperitoneal ascites with diffusely increased attenuation of the mesenteric leaflets and fluid tracking and bilateral inguinal hernias. Extensive circumferential body wall edema is present as well. As well as milder edematous changes in the soft tissues of the imaged extremities. No free intraperitoneal air Musculoskeletal: Degenerative changes present in both wrists. Multilevel degenerative changes  are present in the imaged portions of the spine. Disc bulges present at L3-4 and L4-5. Vacuum disc noted at L3-4 as well. IMPRESSION: 1. Moderate volume of free intraperitoneal ascites, bilateral effusions, circumferential body wall and extremity edema compatible with anasarca. 2. Edematous mural thickening of much of the small bowel, more focally pronounced in the cecum and ascending colon, possibly reflective of the patient's fluid status though cannot exclude underlying enterocolitis. Correlate with symptoms. 3. Venous engorgement of the gonadal veins, which can be seen with pelvic congestion syndrome. 4. Retroverted fibroid uterus. 5. Bilateral fluid-filled inguinal hernias 6. Aortic Atherosclerosis (ICD10-I70.0). Electronically Signed   By: Lovena Le M.D.   On: 01/06/2019 21:13   Dg Chest Port 1 View  Result Date: 01/23/2019 CLINICAL DATA:  Cough. EXAM: PORTABLE CHEST 1 VIEW COMPARISON:  Radiograph 01/13/2019 FINDINGS: Enteric tube has been removed. Lower lung volumes from prior. Patient is rotated. Cardiomegaly is more prominent than on prior exam. Unchanged mediastinal contours with aortic atherosclerosis. Moderate bilateral pleural effusions and associated bibasilar opacities, left greater than right. No evidence of pulmonary edema. No confluent airspace disease. No pneumothorax. IMPRESSION: 1. Moderate bilateral pleural effusions with associated bibasilar opacities, left greater than right, likely atelectasis. 2. Cardiomegaly is more prominent than on prior exam. Aortic Atherosclerosis (ICD10-I70.0). Electronically Signed   By: Keith Rake M.D.   On: 01/23/2019 19:30   Dg Chest Port 1 View  Result Date: 01/13/2019 CLINICAL DATA:  Patient is spitting up 2 feeds. Possible aspiration. EXAM: PORTABLE CHEST 1 VIEW COMPARISON:  Single-view of the chest 12/16/2018. FINDINGS: Feeding tube courses into the stomach and below the inferior margin of the film. The patient has moderate bilateral pleural  effusions. Mild basilar atelectasis noted. There is cardiomegaly without edema. No pneumothorax. No acute or focal bony abnormality. IMPRESSION: Moderate layering pleural effusions with mild bibasilar atelectasis. Cardiomegaly without edema. Electronically Signed   By: Inge Rise M.D.   On: 01/13/2019 09:47   Dg Addison Bailey G Tube Plc W/fl-no Rad  Result Date: 01/17/2019 CLINICAL DATA:  85 year old female with a history of enteric tube placement EXAM: ABDOMEN - 1 VIEW; DG NASO G TUBE PLC W/FL-NO RAD COMPARISON:  January 13, 2019 FINDINGS: Limited plain film demonstrates post pyloric tube terminating in the 3/4 portion of the duodenum. IMPRESSION: Post pyloric feeding tube terminates in the 3/4 portion the duodenum. Electronically Signed   By: Corrie Mckusick D.O.   On: 01/17/2019 15:55   US Liver Doppler  Result Date: 01/13/2019 CLINICAL DATA:  85 year old female with elevated liver enzymes. Evaluate for portal vein stenosis/occlusion. EXAM: DUPLEX ULTRASOUND OF LIVER TECHNIQUE: Color and duplex Doppler ultrasound was performed to evaluate the hepatic in-flow and out-flow vessels. COMPARISON:  Prior CT abdomen/pelvis 01/06/2019 FINDINGS: Liver: Normal parenchymal echogenicity. Normal hepatic contour without nodularity. No focal lesion, mass or intrahepatic biliary ductal dilatation. Portal Vein Velocities Main:  35 cm/sec with normal hepatopetal flow. Right:  23 cm/sec with normal hepatopetal flow. Left:  27 cm/sec with normal hepatopetal flow. Hepatic Vein Velocities Right:  16 cm/sec Middle:  27 cm/sec Left:  23 cm/sec IVC: Present and patent with normal respiratory phasicity. Hepatic Artery Velocity:  102 cm/sec Splenic Vein Velocity:  31 cm/sec Varices: Absent Ascites: Small volume ascites. Other: Bilateral pleural effusions are noted incidentally. IMPRESSION: 1. Patent and normal appearing portal and hepatic veins. 2. Small volume ascites. 3. Bilateral moderate pleural effusions. 4. Normal sonographic  appearance of the liver. Electronically Signed   By: Jacqulynn Cadet M.D.   On: 01/13/2019 16:00     LOS: 38 days   Oren Binet, MD  [facility]  If 7PM-7AM, please contact night-coverage  Please page via [email or web address]  Go to [email or web address] and use [facility]'s universal password to access. If you do not have the password, please contact the hospital operator.  Locate the [facility] provider you are looking for under [facility] and page to a number that you can be directly reached. If you still have difficulty reaching the provider, please page the [facility] (Director on Call) for the Hospitalists listed on amion for assistance.  01/27/2019, 2:56 PM

## 2019-01-27 NOTE — Progress Notes (Signed)
Palliative Medicine RN Note: Discussed pt in team rounds. Family is adamant they do not want PMT involvement, so we will sign off. If this changes and the daughter clearly agrees to meet with [place], please re-consult [place].  Marjie Skiff Sumiko Ceasar, RN, BSN, [facility] Palliative Medicine Team 01/27/2019 1:40 PM Office [phone]

## 2019-01-27 NOTE — Consult Note (Signed)
WOC consult for sacrum wound requested.  This has already been performed; refer to consult note on 9/30 for assessment and measurements, and topical treatment orders have been provided for bedside nurses to perform daily. Please re-consult if further assistance is needed.  Thank-you,  Julien Girt MSN, [place], [place], [place], [place]

## 2019-01-27 NOTE — Care Management Important Message (Signed)
Important Message  Patient Details  Name: Sydney [name] MRN: [id number] Date of Birth: 1930-11-06   Medicare Important Message Given:  Yes     [name] 01/27/2019, 3:00 PM

## 2019-01-27 NOTE — TOC Progression Note (Signed)
Transition of Care [facility]) - Progression Note    Patient Details  Name: Sydney [name] MRN: [id number] Date of Birth: 1930-08-02  [facility] CM/SW Contact  Shirley Bolle, Edson Snowball, RN Phone Number: 01/27/2019, 4:38 PM  Clinical Narrative:     Was told Zack with [employer] he spoke to [facility] and she wants fully electric bed and air loss mattress. [name] will pay the difference. Zack requesting orders. Orders entered. Called [name]. [name] answered , however she is on the other line and will call NCM back.   Expected Discharge Plan: [place] Barriers to Discharge: Continued Medical Work up  Expected Discharge Plan and Services Expected Discharge Plan: [place] In-house Referral: Ehtics Consult, Nutrition, Chaplain Discharge Planning Services: CM Consult Post Acute Care Choice: Durable Medical Equipment, Home Health Living arrangements for the past 2 months: [place]                 DME Arranged: Hospital bed, Wheelchair manual DME Agency: [employer] Date DME Agency Contacted: 01/25/19 Time DME Agency Contacted: [id number] Representative spoke with at DME Agency: zack HH Arranged: RN, PT [place] Agency: [place] Date [place]: 01/25/19 Time [place]: 1129 Representative spoke with at [place]: Tommi Rumps checking on cost of daily RN and PT visits   Social Determinants of Health (SDOH) Interventions    Readmission Risk Interventions No flowsheet data found.

## 2019-01-27 NOTE — Progress Notes (Addendum)
   Patient Name: Sydney [name] Date of Encounter: 01/27/2019, 1:11 PM    Subjective  In bed - quiet  Per daughter "she is making progress" "Asking for food at times and eating more"  Objective  BP (!) 144/90 (BP Location: Left Arm)   Pulse 95   Temp 98.6 F (37 C)   Resp 19   Ht 5\' 3"  (1.6 m)   Wt 60.4 kg   SpO2 99%   BMI 23.59 kg/m  Elderly chronically ill Eyes and mouth open - does respond to? Masked facies Daughter present Not examined otherwise as IV being placed Lab Results  Component Value Date   ALT 43 01/27/2019   AST 47 (H) 01/27/2019   ALKPHOS 186 (H) 01/27/2019   BILITOT 0.8 01/27/2019      Assessment and Plan  Abnl LFT Altered mental status Depression Severe anorexia w/ weight loss   I don't know what caused her anl LFt's but they are near NL now and extendive w/u neg I do see elevated Ck at some times though was NL at last - I guess it did not seem like a myositis but do not see that she had an aldolase checked.  I would consider evaluating further for a myositis, / parkinson's  I know she has had an exhaustive evaluation but do not see a neurology evaluation and think it is appropriate  Would also consider increasing mirtazapine to 45 mg and consider a second anti-depressant  I chose not to discuss this with patient and daughter yet   Gatha Mayer, MD, [facility] 01/27/2019 1:11 PM Pager [phone]

## 2019-01-28 DIAGNOSIS — R4702 Dysphasia: Secondary | ICD-10-CM

## 2019-01-28 DIAGNOSIS — Z8673 Personal history of transient ischemic attack (TIA), and cerebral infarction without residual deficits: Secondary | ICD-10-CM | POA: Diagnosis not present

## 2019-01-28 DIAGNOSIS — R531 Weakness: Secondary | ICD-10-CM | POA: Diagnosis not present

## 2019-01-28 DIAGNOSIS — R627 Adult failure to thrive: Secondary | ICD-10-CM | POA: Diagnosis not present

## 2019-01-28 DIAGNOSIS — R7989 Other specified abnormal findings of blood chemistry: Secondary | ICD-10-CM | POA: Diagnosis not present

## 2019-01-28 LAB — COMPREHENSIVE METABOLIC PANEL
ALT: 37 U/L (ref 0–44)
AST: 44 U/L — ABNORMAL HIGH (ref 15–41)
Albumin: 1.8 g/dL — ABNORMAL LOW (ref 3.5–5.0)
Alkaline Phosphatase: 168 U/L — ABNORMAL HIGH (ref 38–126)
Anion gap: 7 (ref 5–15)
BUN: 16 mg/dL (ref 8–23)
CO2: 18 mmol/L — ABNORMAL LOW (ref 22–32)
Calcium: 7.5 mg/dL — ABNORMAL LOW (ref 8.9–10.3)
Chloride: 110 mmol/L (ref 98–111)
Creatinine, Ser: 0.74 mg/dL (ref 0.44–1.00)
GFR calc Af Amer: >60 mL/min (ref >60)
GFR calc non Af Amer: >60 mL/min (ref >60)
Glucose, Bld: 87 mg/dL (ref 70–99)
Potassium: 3.2 mmol/L — ABNORMAL LOW (ref 3.5–5.1)
Sodium: 135 mmol/L (ref 135–145)
Total Bilirubin: 0.4 mg/dL (ref 0.3–1.2)
Total Protein: 5.2 g/dL — ABNORMAL LOW (ref 6.5–8.1)

## 2019-01-28 LAB — SEDIMENTATION RATE: Sed Rate: 65 mm/hr — ABNORMAL HIGH (ref 0–22)

## 2019-01-28 LAB — C-REACTIVE PROTEIN: CRP: 2.8 mg/dL — ABNORMAL HIGH (ref <1.0)

## 2019-01-28 LAB — CK: Total CK: 72 U/L (ref 38–234)

## 2019-01-28 LAB — GLUCOSE, CAPILLARY: Glucose-Capillary: 77 mg/dL (ref 70–99)

## 2019-01-28 MED ORDER — TRAMADOL HCL 50 MG PO TABS: 25.0000 mg | ORAL_TABLET | Freq: Four times a day (QID) | ORAL | Status: DC | PRN

## 2019-01-28 MED ORDER — IBUPROFEN 600 MG PO TABS
600.0000 mg | ORAL_TABLET | Freq: Once | ORAL | Status: AC
  Administered 2019-01-28: 01:00:00 600 mg via ORAL

## 2019-01-28 MED ORDER — POTASSIUM CHLORIDE 20 MEQ/15ML (10%) PO SOLN
40.0000 meq | Freq: Once | ORAL | Status: AC
  Administered 2019-01-28: 10:00:00 40 meq via ORAL
  Filled 2019-01-28: qty 30

## 2019-01-28 NOTE — Progress Notes (Signed)
PROGRESS NOTE        PATIENT DETAILS Name: Sydney [name] Age: 85 y.o. Sex: female Date of Birth: 04-14-1931 Admit Date: 12/19/2018 Admitting Physician Vianne Bulls, MD [id number], Carlota Raspberry, MD  Brief Narrative: Patient is a 85 y.o. 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  See below for further details.  Subjective: Major issue overnight-pain at the sacral area where she has a sacral decubitus ulcer.  Per nursing staff she did not eat a whole lot this morning-Per daughter-she had half a cup of soup and a few more bites in the afternoon yesterday.  Have asked nurse to make sure we do a calorie count.  Assessment/Plan: Progressive generalized weakness associated with failure to thrive syndrome/weight loss: Slowly improving-exhaustive work-up remains negative to date.  Main issue at this point is waxing and waning appetite-we are waiting caloric count to see if she can sustain herself with oral intake at this point.    Etiology of progressive weakness/weight loss/failure to thrive syndrome remains unknown however TSH/a.m. cortisol/blood cultures all unremarkable.  CT of the abdomen without any obvious malignancy.  MRI brain without any obvious etiology.  Endoscopic evaluation was contemplated but given patient's tenuous clinical state-and suspicion of low yield for diagnoses-not pursued.  Not sure if a transient hepatitis-in a cholestatic pattern (?  Drug-induced hepatitis) could have caused this much of weakness.  It seems like with improvement in LFTs-her mentation/appetite seems to have improved somewhat.  Appreciate Dr. Celesta Aver note from 10/2-I have consulted neurology today for their opinion (see my  note from yesterday)  In the meantime-continue Megace, mirtazapine and supportive care.  I have asked nursing staff to assist with feeding at all times.  Await calorie count-if patient's oral intake is inadequate-we will need to sit down with the patient's daughter and discussed various options.  Daughter is against placing NG tube/PEG tube-she unfortunately has refused to meet with the palliative care team in the past.   Acute metabolic encephalopathy: Etiology uncertain-see above-however she has improved this morning-is hard of hearing but able to answer most of my questions appropriately.  Transaminitis: Mostly in a cholestatic pattern-viral hepatitis serology including CMV/EBV are negative.  Autoimmune hepatitis panel negative.  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  Thankfully LFTs have almost normalized-as noted above-not sure if a drug-induced hepatitis  (?which drug) process can cause this much of encephalopathy (ammonia within normal limits) and severe failure to thrive syndrome.  CVA: Has generalized weakness but nonfocal-continue aspirin-if clinical improvement continues and no further procedures are contemplated-suspect we could resume Plavix soon.  Hypokalemia/hypomagnesemia: Continue to replete and recheck  Odynophagia: Improving-continue nystatin.  Thrombocytopenia: Resolved-suspect was secondary to transient hepatitis.  Anemia: Likely secondary to acute illness  HTN: Blood pressure controlled without the use of any antihypertensives.  Deconditioning/debility: Secondary to prolonged hospitalization/acute illness-reconsult PT-probably will require several weeks/months before patient can regain prior functional status.  Daughter is not interested in SNF-she wants to take patient home with maximum home health services.  Nutrition Problem: Nutrition Problem: Severe Malnutrition Etiology: chronic illness(TIA) Signs/Symptoms: energy intake < or  equal to 75% for > or equal to 1 month, moderate  fat depletion, severe fat depletion, moderate muscle depletion, severe muscle depletion Interventions: Tube feeding (now discontinued)  Sacral decubitus ulcer stage II: Continue wound care-complains of pain at the sacral decubitus ulcer site-spoke with daughter regarding use of very low-dose narcotics to alleviate some of her pain that is breaking through Tylenol/NSAIDs.  Daughter will let me know.  Diet: Diet Order            DIET DYS 3 Room service appropriate? Yes; Fluid consistency: Thin  Diet effective now               DVT Prophylaxis: Prophylactic Lovenox   Code Status: Full code   Family Communication: Daughter over the phone on 10/3  Disposition Plan: Remain inpatient-daughter refuses SNF-we will maximize home health services on discharge.   Antimicrobial agents: Anti-infectives (From admission, onward)   Start     Dose/Rate Route Frequency Ordered Stop   01/06/19 2200  cephALEXin (KEFLEX) 250 MG/5ML suspension 500 mg     500 mg Per Tube Every 12 hours 01/06/19 1734 01/08/19 2141   01/06/19 1045  ceFAZolin (ANCEF) IVPB 1 g/50 mL premix  Status:  Discontinued     1 g 100 mL/hr over 30 Minutes Intravenous Every 12 hours 01/06/19 1037 01/06/19 1734   01/06/19 1000  vancomycin (VANCOCIN) 50 mg/mL oral solution 125 mg     125 mg Per Tube Every 12 hours 01/06/19 0849 01/08/19 1226   01/05/19 1200  cefTRIAXone (ROCEPHIN) 1 g in sodium chloride 0.9 % 100 mL IVPB  Status:  Discontinued     1 g 200 mL/hr over 30 Minutes Intravenous Every 24 hours 01/05/19 1125 01/06/19 1037      Procedures: None  CONSULTS:  GI and hematology/oncology  Time spent: 25- minutes-Greater than 50% of this time was spent in counseling, explanation of diagnosis, planning of further management, and coordination of care.  MEDICATIONS: Scheduled Meds:  aspirin EC  81 mg Oral Daily   chlorhexidine  15 mL Mouth Rinse BID   enoxaparin  (LOVENOX) injection  40 mg Subcutaneous Q24H   feeding supplement  1 Container Oral TID BM   feeding supplement (PRO-STAT SUGAR FREE 64)  30 mL Oral BID WC   Gerhardt's butt cream   Topical TID   latanoprost  1 drop Both Eyes QHS   mouth rinse  15 mL Mouth Rinse q12n4p   megestrol  400 mg Oral TID   mirtazapine  30 mg Oral QHS   multivitamin with minerals  1 tablet Oral Daily   nystatin  5 mL Oral 5 X Daily   nystatin   Topical BID   pantoprazole  40 mg Oral Daily   sodium chloride flush  3 mL Intravenous Q12H   Continuous Infusions:  sodium chloride     PRN Meds:.sodium chloride, acetaminophen **OR** acetaminophen, guaiFENesin-dextromethorphan, hydroxypropyl methylcellulose / hypromellose, magic mouthwash, [DISCONTINUED] ondansetron **OR** ondansetron (ZOFRAN) IV, sodium chloride flush   PHYSICAL EXAM: Vital signs: Vitals:   01/27/19 0539 01/27/19 1426 01/27/19 2103 01/28/19 0612  BP: (!) 144/90 (!) 142/77 120/68 128/77  Pulse: 95 89 (!) 107 91  Resp: 19 17 18 18   Temp: 98.6 F (37 C) 98.2 F (36.8 C) 97.9 F (36.6 C) (!) 97.3 F (36.3 C)  TempSrc:  Oral Oral Oral  SpO2: 99% 98% 96% 98%  Weight:      Height:       Filed Weights   01/21/19 0500 01/24/19 0500 01/25/19 0500  Weight: 60 kg 60.4 kg  60.4 kg   Body mass index is 23.59 kg/m.   Gen Exam:Alert awake-not in any distress and very chronically ill-appearing and frail looking. HEENT:atraumatic, normocephalic Chest: B/L clear to auscultation anteriorly CVS:S1S2 regular Abdomen:soft non tender, non distended Extremities:no edema Neurology: Non focal-but with generalized weakness. Skin: no rash  I have personally reviewed following labs and imaging studies  LABORATORY DATA: CBC: Recent Labs  Lab 01/24/19 0224 01/26/19 0139  WBC 4.5 4.3  NEUTROABS 3.6  --   HGB 9.2* 9.8*  HCT 28.5* 29.6*  MCV 99.0 97.4  PLT 217 [id number]    Basic Metabolic Panel: Recent Labs  Lab 01/24/19 0224  01/26/19 0139 01/27/19 0525 01/28/19 0330  NA 140 134* 136 135  K 3.1* 3.0* 3.7 3.2*  CL 113* 110 110 110  CO2 17* 20* 17* 18*  GLUCOSE 72 84 69* 87  BUN 22 16 13 16   CREATININE 0.68 0.73 0.70 0.74  CALCIUM 7.7* 7.7* 7.7* 7.5*  MG  --  1.7 2.1  --   PHOS  --  2.5  --   --     GFR: Estimated Creatinine Clearance: 41.8 mL/min (by C-G formula based on SCr of 0.74 mg/dL).  Liver Function Tests: Recent Labs  Lab 01/24/19 0224 01/26/19 0139 01/27/19 0525 01/28/19 0330  AST 69* 54* 47* 44*  ALT 59* 49* 43 37  ALKPHOS 211* 191* 186* 168*  BILITOT 1.3* 0.8 0.8 0.4  PROT 5.5* 5.8* 5.4* 5.2*  ALBUMIN 1.9* 1.9* 1.9* 1.8*   No results for input(s): LIPASE, AMYLASE in the last 168 hours. No results for input(s): AMMONIA in the last 168 hours.  Coagulation Profile: No results for input(s): INR, PROTIME in the last 168 hours.  Cardiac Enzymes: Recent Labs  Lab 01/28/19 0330  CKTOTAL 72    BNP (last 3 results) No results for input(s): PROBNP in the last 8760 hours.  HbA1C: No results for input(s): HGBA1C in the last 72 hours.  CBG: Recent Labs  Lab 01/25/19 0951 01/26/19 0809 01/27/19 0807 01/27/19 0858 01/28/19 0824  GLUCAP 94 83 60* 108* 77    Lipid Profile: No results for input(s): CHOL, HDL, LDLCALC, TRIG, CHOLHDL, LDLDIRECT in the last 72 hours.  Thyroid Function Tests: No results for input(s): TSH, T4TOTAL, FREET4, T3FREE, THYROIDAB in the last 72 hours.  Anemia Panel: No results for input(s): VITAMINB12, FOLATE, FERRITIN, TIBC, IRON, RETICCTPCT in the last 72 hours.  Urine analysis:    Component Value Date/Time   COLORURINE YELLOW 01/08/2019 2015   APPEARANCEUR HAZY (A) 01/08/2019 2015   LABSPEC 1.018 01/08/2019 2015   PHURINE 6.0 01/08/2019 2015   GLUCOSEU NEGATIVE 01/08/2019 2015   HGBUR SMALL (A) 01/08/2019 2015   BILIRUBINUR NEGATIVE 01/08/2019 2015   KETONESUR NEGATIVE 01/08/2019 2015   PROTEINUR NEGATIVE 01/08/2019 2015   UROBILINOGEN  0.2 October 19, 202014 1048   NITRITE NEGATIVE 01/08/2019 2015   LEUKOCYTESUR NEGATIVE 01/08/2019 2015    Sepsis Labs: Lactic Acid, Venous    Component Value Date/Time   LATICACIDVEN 1.4 12/20/2018 0012    MICROBIOLOGY: No results found for this or any previous visit (from the past 240 hour(s)).  RADIOLOGY STUDIES/RESULTS: Dg Abd 1 View  Result Date: 01/17/2019 CLINICAL DATA:  85 year old female with a history of enteric tube placement EXAM: ABDOMEN - 1 VIEW; DG NASO G TUBE PLC W/FL-NO RAD COMPARISON:  January 13, 2019 FINDINGS: Limited plain film demonstrates post pyloric tube terminating in the 3/4 portion of the duodenum. IMPRESSION: Post pyloric feeding tube terminates in  the 3/4 portion the duodenum. Electronically Signed   By: Corrie Mckusick D.O.   On: 01/17/2019 15:55   Dg Abd 1 View  Result Date: 01/13/2019 CLINICAL DATA:  Blocked feeding tube. EXAM: ABDOMEN - 1 VIEW COMPARISON:  12/29/2018 FINDINGS: Enteric fusion tube is present with tip over the region of the distal stomach just right of midline in the mid abdomen. Bowel gas pattern is nonobstructive. Remainder of the exam is unchanged. IMPRESSION: Nonobstructive bowel gas pattern. Enteric feeding tube with tip right of midline in the mid abdomen likely over the distal stomach. Electronically Signed   By: Marin Olp M.D.   On: 01/13/2019 12:28   Dg Abd 1 View  Result Date: 12/29/2018 CLINICAL DATA:  Generalized abdominal pain EXAM: ABDOMEN - 1 VIEW COMPARISON:  12/19/2018 FINDINGS: Small bilateral pleural effusions. Mild diffuse gaseous dilatation of bowel without definitive obstruction. Coarse calcifications in the pelvis as before. IMPRESSION: 1. Small bilateral pleural effusions 2. Mild diffuse gaseous prominence of bowel without obstructive pattern. Findings could be secondary to mild ileus or enteritis. Electronically Signed   By: Donavan Foil M.D.   On: 12/29/2018 20:31   Ct Abdomen Pelvis W Contrast  Result Date:  01/06/2019 CLINICAL DATA:  Abdominal pain EXAM: CT ABDOMEN AND PELVIS WITH CONTRAST TECHNIQUE: Multidetector CT imaging of the abdomen and pelvis was performed using the standard protocol following bolus administration of intravenous contrast. CONTRAST:  164mL OMNIPAQUE IOHEXOL 300 MG/ML  SOLN COMPARISON:  CT December 20, 2018 FINDINGS: Lower chest: Moderate bilateral effusions with adjacent areas of passive atelectasis. Mild airways thickening is present. Normal heart size. No pericardial effusion. Hepatobiliary: Stable 11 mm hepatic cyst. No concerning liver abnormality is seen. No calcified gallstones, gallbladder wall thickening or biliary ductal dilatation. Pancreas: Unremarkable. No pancreatic ductal dilatation or surrounding inflammatory changes. Spleen: Diminutive spleen. Otherwise unremarkable. Adrenals/Urinary Tract: Adrenal glands are unremarkable. Kidneys are normal, without renal calculi, focal lesion, or hydronephrosis. Urinary bladder is distended but otherwise unremarkable. Stomach/Bowel: Transesophageal tube tip terminates at the level of the gastric antrum. There is edematous mural thickening of much of the small bowel and more focally pronounced in the cecum and ascending colon. Distal colon assumes a less thickened appearance. The appendix is not well visualized. Vascular/Lymphatic: Atherosclerotic plaque within the normal caliber aorta. Portal and hepatic veins are patent. Venous engorgement of the gonadal veins. No suspicious or enlarged lymph nodes in the included lymphatic chains. Reproductive: Parametrial calcifications and engorged gonadal veins, as above. Few scattered calcified pelvic fibroids within a retroverted uterus. No discernible adnexal lesions. Other: Moderate volume of free intraperitoneal ascites with diffusely increased attenuation of the mesenteric leaflets and fluid tracking and bilateral inguinal hernias. Extensive circumferential body wall edema is present as well. As well  as milder edematous changes in the soft tissues of the imaged extremities. No free intraperitoneal air Musculoskeletal: Degenerative changes present in both wrists. Multilevel degenerative changes are present in the imaged portions of the spine. Disc bulges present at L3-4 and L4-5. Vacuum disc noted at L3-4 as well. IMPRESSION: 1. Moderate volume of free intraperitoneal ascites, bilateral effusions, circumferential body wall and extremity edema compatible with anasarca. 2. Edematous mural thickening of much of the small bowel, more focally pronounced in the cecum and ascending colon, possibly reflective of the patient's fluid status though cannot exclude underlying enterocolitis. Correlate with symptoms. 3. Venous engorgement of the gonadal veins, which can be seen with pelvic congestion syndrome. 4. Retroverted fibroid uterus. 5. Bilateral fluid-filled inguinal hernias 6.  Aortic Atherosclerosis (ICD10-I70.0). Electronically Signed   By: Lovena Le M.D.   On: 01/06/2019 21:13   Dg Chest Port 1 View  Result Date: 01/23/2019 CLINICAL DATA:  Cough. EXAM: PORTABLE CHEST 1 VIEW COMPARISON:  Radiograph 01/13/2019 FINDINGS: Enteric tube has been removed. Lower lung volumes from prior. Patient is rotated. Cardiomegaly is more prominent than on prior exam. Unchanged mediastinal contours with aortic atherosclerosis. Moderate bilateral pleural effusions and associated bibasilar opacities, left greater than right. No evidence of pulmonary edema. No confluent airspace disease. No pneumothorax. IMPRESSION: 1. Moderate bilateral pleural effusions with associated bibasilar opacities, left greater than right, likely atelectasis. 2. Cardiomegaly is more prominent than on prior exam. Aortic Atherosclerosis (ICD10-I70.0). Electronically Signed   By: Keith Rake M.D.   On: 01/23/2019 19:30   Dg Chest Port 1 View  Result Date: 01/13/2019 CLINICAL DATA:  Patient is spitting up 2 feeds. Possible aspiration. EXAM: PORTABLE  CHEST 1 VIEW COMPARISON:  Single-view of the chest 12/16/2018. FINDINGS: Feeding tube courses into the stomach and below the inferior margin of the film. The patient has moderate bilateral pleural effusions. Mild basilar atelectasis noted. There is cardiomegaly without edema. No pneumothorax. No acute or focal bony abnormality. IMPRESSION: Moderate layering pleural effusions with mild bibasilar atelectasis. Cardiomegaly without edema. Electronically Signed   By: Inge Rise M.D.   On: 01/13/2019 09:47   Dg Addison Bailey G Tube Plc W/fl-no Rad  Result Date: 01/17/2019 CLINICAL DATA:  85 year old female with a history of enteric tube placement EXAM: ABDOMEN - 1 VIEW; DG NASO G TUBE PLC W/FL-NO RAD COMPARISON:  January 13, 2019 FINDINGS: Limited plain film demonstrates post pyloric tube terminating in the 3/4 portion of the duodenum. IMPRESSION: Post pyloric feeding tube terminates in the 3/4 portion the duodenum. Electronically Signed   By: Corrie Mckusick D.O.   On: 01/17/2019 15:55   US Liver Doppler  Result Date: 01/13/2019 CLINICAL DATA:  85 year old female with elevated liver enzymes. Evaluate for portal vein stenosis/occlusion. EXAM: DUPLEX ULTRASOUND OF LIVER TECHNIQUE: Color and duplex Doppler ultrasound was performed to evaluate the hepatic in-flow and out-flow vessels. COMPARISON:  Prior CT abdomen/pelvis 01/06/2019 FINDINGS: Liver: Normal parenchymal echogenicity. Normal hepatic contour without nodularity. No focal lesion, mass or intrahepatic biliary ductal dilatation. Portal Vein Velocities Main:  35 cm/sec with normal hepatopetal flow. Right:  23 cm/sec with normal hepatopetal flow. Left:  27 cm/sec with normal hepatopetal flow. Hepatic Vein Velocities Right:  16 cm/sec Middle:  27 cm/sec Left:  23 cm/sec IVC: Present and patent with normal respiratory phasicity. Hepatic Artery Velocity:  102 cm/sec Splenic Vein Velocity:  31 cm/sec Varices: Absent Ascites: Small volume ascites. Other: Bilateral  pleural effusions are noted incidentally. IMPRESSION: 1. Patent and normal appearing portal and hepatic veins. 2. Small volume ascites. 3. Bilateral moderate pleural effusions. 4. Normal sonographic appearance of the liver. Electronically Signed   By: Jacqulynn Cadet M.D.   On: 01/13/2019 16:00     LOS: 39 days   Oren Binet, MD  [facility]  If 7PM-7AM, please contact night-coverage  Please page via [email or web address]  Go to [email or web address] and use [place]'s universal password to access. If you do not have the password, please contact the hospital operator.  Locate the [facility] provider you are looking for under [facility] and page to a number that you can be directly reached. If you still have difficulty reaching the provider, please page the [facility] (Director on Call) for the Hospitalists listed on amion for assistance.  01/28/2019, 11:26 AM

## 2019-01-28 NOTE — Progress Notes (Signed)
Patient has been turned frequently at less than 2 hours interval per daughter's request

## 2019-01-28 NOTE — Consult Note (Addendum)
NEURO HOSPITALIST CONSULT NOTE   Requesting physician: Dr. Sloan Leiter   Reason for Consult:gait problem   History obtained from:  Patient  / Chart    HPI:                                                                                                                                          Sydney [name] is an 85 y.o. 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 She has also had  worsening liver enzymes.    She stated that she came in because she had weakness that progressed to the point that she was on the ground and could not get up. She denies falling. She denies one side being weaker than the other or having been sick prior to getting weak.  She stated that she had been getting weak for about a month. ( though I now question how oriented to time she is at this point).  Hospital course: Admitted: 12/19/2018 12/21/2018 MRI: no acute abnormality; chronic ischemic microangiopathy CRP ( 10/3): 2.8 CK: WNL potassium (3.2) albumin (1.8), AST (44), Alk phos (168), C. Diff antigen (9/8): negative, UA ( 9/13): no UTI , TSH : WNL, ammonia: WNL , RPR: non-reactive, Hepatitis A and B negative  Past Medical History:  Diagnosis Date  . Anemia    years ago  . Aortic atherosclerosis ([place])   . Arthritis    "left hand" (02/17/2013)  . Bilateral inguinal hernia    INCIDENTAL CT 1/20  . C. difficile diarrhea   . Cataract   . Dehydration 12/29/2018  . Diplopia    CRANIAL 6 NERVE PALSY  . Dizzy   . Dyslipidemia   . Gastritis   . Glaucoma   . High cholesterol    "on RX years ago" (02/17/2013)  . Hypertension   . Leukopenia   . Meningioma ([place]) 05/01/2015  . Osteoporosis 02/2014   T score -2.5  followed by Dr. Lysle Rubens  . Palpitations    PACs, PVCs and short runs of atrial tachycardia on Holter monitoring  . PVD (peripheral vascular disease) ([place])   .  Rhinitis   . Syncopal episodes 2003  . TIA (transient ischemic attack) 1990's  . Trigger thumb of left hand   . Weakness 12/29/2018  . Winter itch     Past Surgical History:  Procedure Laterality Date  . ANAL FISTULECTOMY  1970  . COLONOSCOPY    . COLONOSCOPY WITH PROPOFOL N/A 12/02/2016   Procedure: COLONOSCOPY WITH PROPOFOL;  Surgeon: Gatha Mayer, MD;  Location: [facility] ENDOSCOPY;  Service: Endoscopy;  Laterality: N/A;  . FECAL TRANSPLANT N/A 12/02/2016   Procedure: FECAL TRANSPLANT;  Surgeon: Gatha Mayer, MD;  Location[facility] ENDOSCOPY;  Service: Endoscopy;  Laterality: N/A;  . TONSILLECTOMY      Family History  Problem Relation Age of Onset  . Hypertension Father   . Heart attack Father   . Stroke Brother   . Stroke Maternal Grandmother        Social History:  reports that she has never smoked. She has never used smokeless tobacco. She reports that she does not drink alcohol or use drugs.  Allergies  Allergen Reactions  . Demerol [Meperidine] Other (See Comments)    Excessive sweating, cramping  . Other     No Antibiotic without consultation with her primary physician.  . Codeine Rash  . Sulfa Antibiotics Itching and Other (See Comments)    Reaction unknown  . Tomato Itching    MEDICATIONS:                                                                                                                     Scheduled: . aspirin EC  81 mg Oral Daily  . chlorhexidine  15 mL Mouth Rinse BID  . enoxaparin (LOVENOX) injection  40 mg Subcutaneous Q24H  . feeding supplement  1 Container Oral TID BM  . feeding supplement (PRO-STAT SUGAR FREE 64)  30 mL Oral BID WC  . Gerhardt's butt cream   Topical TID  . latanoprost  1 drop Both Eyes QHS  . mouth rinse  15 mL Mouth Rinse q12n4p  . megestrol  400 mg Oral TID  . mirtazapine  30 mg Oral QHS  . multivitamin with minerals  1 tablet Oral Daily  . nystatin  5 mL Oral 5 X Daily  . nystatin   Topical BID  . pantoprazole  40 mg  Oral Daily  . potassium chloride  40 mEq Oral Once  . sodium chloride flush  3 mL Intravenous Q12H   Continuous: . sodium chloride     POE:UMPNTI chloride, acetaminophen **OR** acetaminophen, guaiFENesin-dextromethorphan, hydroxypropyl methylcellulose / hypromellose, magic mouthwash, [DISCONTINUED] ondansetron **OR** ondansetron (ZOFRAN) IV, sodium chloride flush   ROS:                                                                                                                                       ROS was performed and is negative except as noted in HPI   Blood pressure 128/77, pulse 91, temperature (!) 97.3  F (36.3 C), temperature source Oral, resp. rate 18, height '5\' 3"'  (1.6 m), weight 60.4 kg, SpO2 98 %.   General Examination:                                                                                                       Physical Exam  HEENT-  Normocephalic, no lesions, without obvious abnormality.  Normal external eye and conjunctiva.   Cardiovascular- , pulses palpable throughout   Lungs-no excessive working breathing.  Saturations within normal limits Abdomen- All 4 quadrants palpated and nontender Extremities- Warm, dry and intact Musculoskeletal-no joint tenderness, deformity or swelling Skin-warm and dry, no hyperpigmentation, vitiligo, or suspicious lesions  Neurological Examination Mental Status: Alert, oriented name/age/ year thought it was august. thought content appropriate.  Speech fluent without evidence of aphasia.  Able to follow  commands without difficulty. Cranial Nerves: II: Visual fields grossly normal,  III,IV, VI: ptosis not present, extra-ocular motions intact bilaterally pupils equal, round, reactive to light and accommodation V,VII: smile symmetric, facial light touch sensation normal bilaterally VIII: hearing normal bilaterally IX,X: uvula rises symmetrically XI: bilateral shoulder shrug XII: midline tongue extension Motor: She has mild  proximal weakness in the arms, better in the legs.  She has a spastic paresis extensors > flexors with 3-4/5 strength in the bilateral LE.  Sensory: cool temp and light touch intact throughout, bilaterally. Proprioception intact. vibratory sense impaired at the toes.  Deep Tendon Reflexes: 2+ and symmetric biceps and patella Plantars: Right: downgoing   Left: downgoing Gait: deferred   Lab Results: Basic Metabolic Panel: Recent Labs  Lab 01/24/19 0224 01/26/19 0139 01/27/19 0525 01/28/19 0330  NA 140 134* 136 135  K 3.1* 3.0* 3.7 3.2*  CL 113* 110 110 110  CO2 17* 20* 17* 18*  GLUCOSE 72 84 69* 87  BUN '22 16 13 16  ' CREATININE 0.68 0.73 0.70 0.74  CALCIUM 7.7* 7.7* 7.7* 7.5*  MG  --  1.7 2.1  --   PHOS  --  2.5  --   --     CBC: Recent Labs  Lab 01/24/19 0224 01/26/19 0139  WBC 4.5 4.3  NEUTROABS 3.6  --   HGB 9.2* 9.8*  HCT 28.5* 29.6*  MCV 99.0 97.4  PLT 217 248    Cardiac Enzymes: Recent Labs  Lab 01/28/19 0330  CKTOTAL 72     Imaging: No recent imaging. MRI reviewed   Laurey Morale, MSN, NP-C [facility] [phone]   01/28/2019, 8:34 AM  Attending neurologist's note to follow with recommendations   I have seen the patient reviewed the above note.  After discussing with the daughter, it sounds like the gait dysfunction was rather abrupt.  She states that it occurred on "August 20" and has been a fairly static deficit since that time.  On exam she appears to have some spasticity of her lower extremities, though she is not markedly hyperreflexic.  Assessment: 85 year old female with lower extremity weakness.  She appears to have some spasticity and I am concerned that this represents spinal cord injury, either  through infarct, compression, or metabolic myelopathy.  I would favor imaging her C-spine and T-spine.  I think myopathy is much less likely.  Recommendations:  1) MRI cervical spine and thoracic spine 2) further  recommendations following the above imaging.  Roland Rack, MD [facility] [phone]  If 7pm- 7am, please page neurology on call as listed in [place].

## 2019-01-28 NOTE — Progress Notes (Addendum)
Pt has required very frequent turning and repositioning on an average of every 30-45 minutes.  Pt has yelled out all night in pain.  @0040  Pt verbalized being in pain and repositioning pt was no longer helping. Too early to give pt another dose of tylenol (which was minimally effective) so MD called for additional pain medication.  Unable to request any narcotic pain relief as pt daughter is very sure and reminded RN that she does not want her mother to receive such medications.  MD ordered a one time dose of ibuprofen 600mg . Given @ 0112.  Pt still required frequent repositioning and continued yelling loudly for help.  Pt may have slept a total of 1 hour in 2 30 min intervals.  Pt pain is not effectively being managed at this time due to restrictions placed by pt [name]).  Additional alternatives may be needed for pt at this time.  Will continue to monitor for safety.   @0600  pt [name] called and spoke with RN to check on pt.  [name] asked if pt had a good night and RN explained pt state as described above.  RN asked daughter the reason she did not want her mother to have any narcotic medications. Daughter explained that she believed pt was over-sedated with Ativan, Morphine and Tramadol.  RN voiced understanding and agreed that concern was needed however we are now operating in another extreme.  Pt is now not receiving any adequate treatment for her pain.  RN explained that perhaps she could agree for pt to receive only one stronger pain medication that is given every 8 hours. Or maybe allow pt to have a medication such as oxycodone at night to aid is comfort while trying to sleep.  Several options were discussed with daughter.  RN encouraged daughter to discuss options further with MD so that pt pain is effectively being managed.  Daughter voiced understanding and was thankful for the information and options given.

## 2019-01-29 ENCOUNTER — Inpatient Hospital Stay (HOSPITAL_COMMUNITY): Payer: Medicare Other

## 2019-01-29 DIAGNOSIS — R4702 Dysphasia: Secondary | ICD-10-CM | POA: Diagnosis not present

## 2019-01-29 DIAGNOSIS — R627 Adult failure to thrive: Secondary | ICD-10-CM | POA: Diagnosis not present

## 2019-01-29 LAB — COMPREHENSIVE METABOLIC PANEL
ALT: 34 U/L (ref 0–44)
AST: 51 U/L — ABNORMAL HIGH (ref 15–41)
Albumin: 1.9 g/dL — ABNORMAL LOW (ref 3.5–5.0)
Alkaline Phosphatase: 159 U/L — ABNORMAL HIGH (ref 38–126)
Anion gap: 8 (ref 5–15)
BUN: 14 mg/dL (ref 8–23)
CO2: 14 mmol/L — ABNORMAL LOW (ref 22–32)
Calcium: 7.6 mg/dL — ABNORMAL LOW (ref 8.9–10.3)
Chloride: 113 mmol/L — ABNORMAL HIGH (ref 98–111)
Creatinine, Ser: 0.75 mg/dL (ref 0.44–1.00)
GFR calc Af Amer: >60 mL/min (ref >60)
GFR calc non Af Amer: >60 mL/min (ref >60)
Glucose, Bld: 98 mg/dL (ref 70–99)
Potassium: 4.1 mmol/L (ref 3.5–5.1)
Sodium: 135 mmol/L (ref 135–145)
Total Bilirubin: 1.1 mg/dL (ref 0.3–1.2)
Total Protein: 5.8 g/dL — ABNORMAL LOW (ref 6.5–8.1)

## 2019-01-29 LAB — CBC
HCT: 33.3 % — ABNORMAL LOW (ref 36.0–46.0)
Hemoglobin: 10.6 g/dL — ABNORMAL LOW (ref 12.0–15.0)
MCH: 31.9 pg (ref 26.0–34.0)
MCHC: 31.8 g/dL (ref 30.0–36.0)
MCV: 100.3 fL — ABNORMAL HIGH (ref 80.0–100.0)
Platelets: 257 10*3/uL (ref 150–400)
RBC: 3.32 MIL/uL — ABNORMAL LOW (ref 3.87–5.11)
RDW: 18.6 % — ABNORMAL HIGH (ref 11.5–15.5)
WBC: 7.3 10*3/uL (ref 4.0–10.5)
nRBC: 0 % (ref 0.0–0.2)

## 2019-01-29 LAB — GLUCOSE, CAPILLARY
Glucose-Capillary: 110 mg/dL — ABNORMAL HIGH (ref 70–99)
Glucose-Capillary: 61 mg/dL — ABNORMAL LOW (ref 70–99)
Glucose-Capillary: 69 mg/dL — ABNORMAL LOW (ref 70–99)
Glucose-Capillary: 107 mg/dL — ABNORMAL HIGH (ref 70–99)

## 2019-01-29 LAB — MAGNESIUM: Magnesium: 2.1 mg/dL (ref 1.7–2.4)

## 2019-01-29 MED ORDER — DEXTROSE 50 % IV SOLN
INTRAVENOUS | Status: AC
  Filled 2019-01-29: qty 50

## 2019-01-29 MED ORDER — DEXTROSE 50 % IV SOLN
12.5000 g | INTRAVENOUS | Status: AC
  Administered 2019-01-29: 12:00:00 12.5 g via INTRAVENOUS

## 2019-01-29 MED ORDER — DEXTROSE 50 % IV SOLN
12.5000 g | INTRAVENOUS | Status: AC
  Administered 2019-01-29: 12.5 g via INTRAVENOUS

## 2019-01-29 NOTE — Progress Notes (Signed)
Subjective: No significant changes  Exam: Vitals:   01/28/19 2106 01/29/19 0529  BP: 120/72 (!) 146/87  Pulse: 97 (!) 108  Resp: 18 17  Temp: 98.4 F (36.9 C) 98.1 F (36.7 C)  SpO2: 99% 98%   Gen: In bed, NAD Resp: non-labored breathing, no acute distress Abd: soft, nt  Neuro: MS: Awake, alert CN: Pupils equal round reactive, extraocular movements intact, visual fields full Motor: She has 4+/5 strength in the deltoids bilaterally, 4+/5 elbow extension flexion, 5/5 distally.  In the lower extremities, she appears to have extensor >flexor weakness with some spasticity Sensory: Intact to light touch, temperature, no evidence of spinal level.  She does have decreased vibration distally DTR: 2+ at the biceps and knees, decreased at the ankles Toes are downgoing  Pertinent Labs: B12 435 a month ago ESR, CRP are elevated  Impression: 85 year old female with gait dysfunction that started relatively acutely in late August.  From her description and exam, I think the most likely etiology is some type of spinal cord insult, and given the history of a sudden change, I think spinal cord infarct is fairly likely.  I think that myopathy/myositis is much less likely, though an MRI of the thigh to assess for areas of inflammation might be useful.  Recommendations: 1) MRI C-spine, T-spine, thigh  Roland Rack, MD [facility] [phone]  If 7pm- 7am, please page neurology on call as listed in [place].

## 2019-01-29 NOTE — Progress Notes (Signed)
Pt refused AM lab draw.  PT states "No just leave me alone.  Let me be".  Charge nurse notified.

## 2019-01-29 NOTE — Progress Notes (Signed)
Pt's CBG 61.  D50 12.5 mg given.  MD notified via secure chat.  Will continue to monitor

## 2019-01-29 NOTE — Progress Notes (Signed)
Spoke with [name] (daughter) gave updates on pt.  Daughter aware that pt refused AM lab draw.  All questions answered at this time.

## 2019-01-29 NOTE — Plan of Care (Signed)
  Problem: Education: Goal: Knowledge of General Education information will improve Description: Including pain rating scale, medication(s)/side effects and non-pharmacologic comfort measures Outcome: Not Progressing   Problem: Health Behavior/Discharge Planning: Goal: Ability to manage health-related needs will improve Outcome: Not Progressing   Problem: Clinical Measurements: Goal: Ability to maintain clinical measurements within normal limits will improve Outcome: Not Progressing Goal: Will remain free from infection Outcome: Progressing Goal: Diagnostic test results will improve Outcome: Not Progressing Goal: Respiratory complications will improve Outcome: Progressing Goal: Cardiovascular complication will be avoided Outcome: Progressing

## 2019-01-29 NOTE — Progress Notes (Signed)
PROGRESS NOTE        PATIENT DETAILS Name: Sydney [name] Age: 85 y.o. Sex: female Date of Birth: Jul 05, 1930 Admit Date: 12/19/2018 Admitting Physician Vianne Bulls, MD [id number], Carlota Raspberry, MD  Brief Narrative: Patient is a 85 y.o. 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  See below for further details.  Subjective: Very minimal oral intake over the past few days per nursing staff.  Daughter not at bedside this morning-awaiting MRI of the T-spine/C-spine and left thigh.  Is awake and alert-and is asking if she can go home (has been doing that for the past few days)  Assessment/Plan: Progressive generalized weakness associated with failure to thrive syndrome/weight loss: Slowly improving-exhaustive work-up remains negative to date.  Main issue at this point is waxing and waning appetite-nursing staff-very minimal oral intake with them-daughter reports some more oral intake with her.  Not certain if she can sustain herself with oral intake at this point.  We are waiting caloric count to see if she can sustain herself with oral intake at this point.    Etiology of progressive weakness/weight loss/failure to thrive syndrome remains unknown however TSH/a.m. cortisol/blood cultures all unremarkable.  CT of the abdomen without any obvious malignancy.  MRI brain without any obvious etiology.  Endoscopic evaluation was contemplated but given patient's tenuous clinical state-and suspicion of low yield for diagnoses-not pursued.  Not sure if a transient hepatitis-in a cholestatic pattern (?  Drug-induced hepatitis) could have caused this much of weakness.  It seems like with improvement in LFTs-her  mentation/appetite seems to have improved somewhat.  Appreciate repeat GI note on 10/2 and neurology note on 10/3-we are awaiting MRI of the T-spine/C-spine and left thigh-some concern for subtle spinal cord injury (example subtle infarct)-that could account for some of the weakness but would not account for elevated LFTs and encephalopathy.  Feels like myositis is unlikely but we will get a MRI of the thigh to see if there is enhancement of the thigh muscles.  In the meantime-continue Megace, mirtazapine and supportive care.  I have asked nursing staff to assist with feeding at all times.  Unfortunately oral intake is very minimal-we will continue to engage with family-do not see any good viable long-term options at this point-daughter has already told me that she does not want a NG tube or PEG tube inserted.  Overall prognosis is obviously pretty poor-unfortunately daughter has refused to sit down with the palliative care team.  I will continue to engage with patient's daughter-we will await MRI studies-although even if some significant findings are found on these imaging studies-I doubt given her poor overall health-it may not end up changing outcome/management.  Acute metabolic encephalopathy: Etiology uncertain-see above-however she has improved this morning-is hard of hearing but able to answer most of my questions appropriately.  Transaminitis: Mostly in a cholestatic pattern-viral hepatitis serology including CMV/EBV are negative.  Autoimmune hepatitis panel negative.  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  Thankfully LFTs have almost normalized-as noted above-not sure if a drug-induced hepatitis  (?which drug) process can cause this much of encephalopathy (ammonia within normal limits) and severe failure to thrive syndrome.  CVA: Has generalized weakness but nonfocal-continue aspirin-if clinical improvement continues and no further procedures are  contemplated-suspect we could resume Plavix soon.  Hypokalemia/hypomagnesemia: Continue to replete and recheck  Odynophagia: Improving-continue nystatin.  Thrombocytopenia: Resolved-suspect was secondary to transient hepatitis.  Anemia: Likely secondary to acute illness  HTN: Blood pressure controlled without the use of any antihypertensives.  Deconditioning/debility: Secondary to prolonged hospitalization/acute illness-reconsult PT-probably will require several weeks/months before patient can regain prior functional status.  Daughter is not interested in SNF-she wants to take patient home with maximum home health services.  Nutrition Problem: Nutrition Problem: Severe Malnutrition Etiology: chronic illness(TIA) Signs/Symptoms: energy intake < or equal to 75% for > or equal to 1 month, moderate fat depletion, severe fat depletion, moderate muscle depletion, severe muscle depletion Interventions: Tube feeding (now discontinued)  Sacral decubitus ulcer stage II: Continue local wound care-continues to have intermittent sacral pain-after extensive discussion with the daughter-we have resumed her tramadol on 10/3.  Reviewed medications-does not look like she got it overnight.  Diet: Diet Order            DIET DYS 3 Room service appropriate? Yes; Fluid consistency: Thin  Diet effective now               DVT Prophylaxis: Prophylactic Lovenox   Code Status: Full code   Family Communication: Daughter over the phone on 10/3-we will await arrival of daughter-and speak to her later today.  Disposition Plan: Remain inpatient-daughter refuses SNF-we will maximize home health services on discharge.   Antimicrobial agents: Anti-infectives (From admission, onward)   Start     Dose/Rate Route Frequency Ordered Stop   01/06/19 2200  cephALEXin (KEFLEX) 250 MG/5ML suspension 500 mg     500 mg Per Tube Every 12 hours 01/06/19 1734 01/08/19 2141   01/06/19 1045  ceFAZolin (ANCEF) IVPB 1  g/50 mL premix  Status:  Discontinued     1 g 100 mL/hr over 30 Minutes Intravenous Every 12 hours 01/06/19 1037 01/06/19 1734   01/06/19 1000  vancomycin (VANCOCIN) 50 mg/mL oral solution 125 mg     125 mg Per Tube Every 12 hours 01/06/19 0849 01/08/19 1226   01/05/19 1200  cefTRIAXone (ROCEPHIN) 1 g in sodium chloride 0.9 % 100 mL IVPB  Status:  Discontinued     1 g 200 mL/hr over 30 Minutes Intravenous Every 24 hours 01/05/19 1125 01/06/19 1037      Procedures: None  CONSULTS:  GI and hematology/oncology  Time spent: 25- minutes-Greater than 50% of this time was spent in counseling, explanation of diagnosis, planning of further management, and coordination of care.  MEDICATIONS: Scheduled Meds:  aspirin EC  81 mg Oral Daily   chlorhexidine  15 mL Mouth Rinse BID   dextrose       enoxaparin (LOVENOX) injection  40 mg Subcutaneous Q24H   feeding supplement  1 Container Oral TID BM   feeding supplement (PRO-STAT SUGAR FREE 64)  30 mL Oral BID WC   Gerhardt's butt cream   Topical TID   latanoprost  1 drop Both Eyes QHS   mouth rinse  15 mL Mouth Rinse q12n4p   megestrol  400 mg Oral TID   mirtazapine  30 mg Oral QHS   multivitamin with minerals  1 tablet Oral Daily   nystatin  5 mL Oral 5 X Daily   nystatin   Topical BID   pantoprazole  40 mg Oral Daily   sodium chloride flush  3 mL Intravenous Q12H  Continuous Infusions:  sodium chloride     PRN Meds:.sodium chloride, acetaminophen **OR** acetaminophen, guaiFENesin-dextromethorphan, hydroxypropyl methylcellulose / hypromellose, magic mouthwash, [DISCONTINUED] ondansetron **OR** ondansetron (ZOFRAN) IV, sodium chloride flush, traMADol   PHYSICAL EXAM: Vital signs: Vitals:   01/28/19 1100 01/28/19 1332 01/28/19 2106 01/29/19 0529  BP: 122/76 129/77 120/72 (!) 146/87  Pulse: 90 96 97 (!) 108  Resp: 17 20 18 17   Temp: 97.8 F (36.6 C) 97.9 F (36.6 C) 98.4 F (36.9 C) 98.1 F (36.7 C)  TempSrc:  Oral Oral Oral Oral  SpO2: 98% 98% 99% 98%  Weight:      Height:       Filed Weights   01/21/19 0500 01/24/19 0500 01/25/19 0500  Weight: 60 kg 60.4 kg 60.4 kg   Body mass index is 23.59 kg/m.  Gen Exam:Alert awake-not in any distress but very frail and chronically sick appearing. HEENT:atraumatic, normocephalic Chest: B/L clear to auscultation anteriorly CVS:S1S2 regular Abdomen:soft non tender, non distended Extremities:no edema Neurology: Has generalized weakness but appears nonfocal Skin: no rash I have personally reviewed following labs and imaging studies  LABORATORY DATA: CBC: Recent Labs  Lab 01/24/19 0224 01/26/19 0139  WBC 4.5 4.3  NEUTROABS 3.6  --   HGB 9.2* 9.8*  HCT 28.5* 29.6*  MCV 99.0 97.4  PLT 217 [id number]    Basic Metabolic Panel: Recent Labs  Lab 01/24/19 0224 01/26/19 0139 01/27/19 0525 01/28/19 0330 01/29/19 0855  NA 140 134* 136 135 135  K 3.1* 3.0* 3.7 3.2* 4.1  CL 113* 110 110 110 113*  CO2 17* 20* 17* 18* 14*  GLUCOSE 72 84 69* 87 98  BUN 22 16 13 16 14   CREATININE 0.68 0.73 0.70 0.74 0.75  CALCIUM 7.7* 7.7* 7.7* 7.5* 7.6*  MG  --  1.7 2.1  --  2.1  PHOS  --  2.5  --   --   --     GFR: Estimated Creatinine Clearance: 41.8 mL/min (by C-G formula based on SCr of 0.75 mg/dL).  Liver Function Tests: Recent Labs  Lab 01/24/19 0224 01/26/19 0139 01/27/19 0525 01/28/19 0330 01/29/19 0855  AST 69* 54* 47* 44* 51*  ALT 59* 49* 43 37 34  ALKPHOS 211* 191* 186* 168* 159*  BILITOT 1.3* 0.8 0.8 0.4 1.1  PROT 5.5* 5.8* 5.4* 5.2* 5.8*  ALBUMIN 1.9* 1.9* 1.9* 1.8* 1.9*   No results for input(s): LIPASE, AMYLASE in the last 168 hours. No results for input(s): AMMONIA in the last 168 hours.  Coagulation Profile: No results for input(s): INR, PROTIME in the last 168 hours.  Cardiac Enzymes: Recent Labs  Lab 01/28/19 0330  CKTOTAL 72    BNP (last 3 results) No results for input(s): PROBNP in the last 8760 hours.  HbA1C: No  results for input(s): HGBA1C in the last 72 hours.  CBG: Recent Labs  Lab 01/27/19 0807 01/27/19 0858 01/28/19 0824 01/29/19 0748 01/29/19 0813  GLUCAP 60* 108* 77 61* 110*    Lipid Profile: No results for input(s): CHOL, HDL, LDLCALC, TRIG, CHOLHDL, LDLDIRECT in the last 72 hours.  Thyroid Function Tests: No results for input(s): TSH, T4TOTAL, FREET4, T3FREE, THYROIDAB in the last 72 hours.  Anemia Panel: No results for input(s): VITAMINB12, FOLATE, FERRITIN, TIBC, IRON, RETICCTPCT in the last 72 hours.  Urine analysis:    Component Value Date/Time   COLORURINE YELLOW 01/08/2019 2015   APPEARANCEUR HAZY (A) 01/08/2019 2015   LABSPEC 1.018 01/08/2019 2015   PHURINE 6.0 01/08/2019 2015  GLUCOSEU NEGATIVE 01/08/2019 2015   HGBUR SMALL (A) 01/08/2019 2015   BILIRUBINUR NEGATIVE 01/08/2019 2015   KETONESUR NEGATIVE 01/08/2019 2015   PROTEINUR NEGATIVE 01/08/2019 2015   UROBILINOGEN 0.2 January 02, 202014 1048   NITRITE NEGATIVE 01/08/2019 2015   LEUKOCYTESUR NEGATIVE 01/08/2019 2015    Sepsis Labs: Lactic Acid, Venous    Component Value Date/Time   LATICACIDVEN 1.4 12/20/2018 0012    MICROBIOLOGY: No results found for this or any previous visit (from the past 240 hour(s)).  RADIOLOGY STUDIES/RESULTS: Dg Abd 1 View  Result Date: 01/17/2019 CLINICAL DATA:  85 year old female with a history of enteric tube placement EXAM: ABDOMEN - 1 VIEW; DG NASO G TUBE PLC W/FL-NO RAD COMPARISON:  January 13, 2019 FINDINGS: Limited plain film demonstrates post pyloric tube terminating in the 3/4 portion of the duodenum. IMPRESSION: Post pyloric feeding tube terminates in the 3/4 portion the duodenum. Electronically Signed   By: Corrie Mckusick D.O.   On: 01/17/2019 15:55   Dg Abd 1 View  Result Date: 01/13/2019 CLINICAL DATA:  Blocked feeding tube. EXAM: ABDOMEN - 1 VIEW COMPARISON:  12/29/2018 FINDINGS: Enteric fusion tube is present with tip over the region of the distal stomach just  right of midline in the mid abdomen. Bowel gas pattern is nonobstructive. Remainder of the exam is unchanged. IMPRESSION: Nonobstructive bowel gas pattern. Enteric feeding tube with tip right of midline in the mid abdomen likely over the distal stomach. Electronically Signed   By: Marin Olp M.D.   On: 01/13/2019 12:28   Ct Abdomen Pelvis W Contrast  Result Date: 01/06/2019 CLINICAL DATA:  Abdominal pain EXAM: CT ABDOMEN AND PELVIS WITH CONTRAST TECHNIQUE: Multidetector CT imaging of the abdomen and pelvis was performed using the standard protocol following bolus administration of intravenous contrast. CONTRAST:  170mL OMNIPAQUE IOHEXOL 300 MG/ML  SOLN COMPARISON:  CT December 20, 2018 FINDINGS: Lower chest: Moderate bilateral effusions with adjacent areas of passive atelectasis. Mild airways thickening is present. Normal heart size. No pericardial effusion. Hepatobiliary: Stable 11 mm hepatic cyst. No concerning liver abnormality is seen. No calcified gallstones, gallbladder wall thickening or biliary ductal dilatation. Pancreas: Unremarkable. No pancreatic ductal dilatation or surrounding inflammatory changes. Spleen: Diminutive spleen. Otherwise unremarkable. Adrenals/Urinary Tract: Adrenal glands are unremarkable. Kidneys are normal, without renal calculi, focal lesion, or hydronephrosis. Urinary bladder is distended but otherwise unremarkable. Stomach/Bowel: Transesophageal tube tip terminates at the level of the gastric antrum. There is edematous mural thickening of much of the small bowel and more focally pronounced in the cecum and ascending colon. Distal colon assumes a less thickened appearance. The appendix is not well visualized. Vascular/Lymphatic: Atherosclerotic plaque within the normal caliber aorta. Portal and hepatic veins are patent. Venous engorgement of the gonadal veins. No suspicious or enlarged lymph nodes in the included lymphatic chains. Reproductive: Parametrial calcifications and  engorged gonadal veins, as above. Few scattered calcified pelvic fibroids within a retroverted uterus. No discernible adnexal lesions. Other: Moderate volume of free intraperitoneal ascites with diffusely increased attenuation of the mesenteric leaflets and fluid tracking and bilateral inguinal hernias. Extensive circumferential body wall edema is present as well. As well as milder edematous changes in the soft tissues of the imaged extremities. No free intraperitoneal air Musculoskeletal: Degenerative changes present in both wrists. Multilevel degenerative changes are present in the imaged portions of the spine. Disc bulges present at L3-4 and L4-5. Vacuum disc noted at L3-4 as well. IMPRESSION: 1. Moderate volume of free intraperitoneal ascites, bilateral effusions, circumferential body wall and  extremity edema compatible with anasarca. 2. Edematous mural thickening of much of the small bowel, more focally pronounced in the cecum and ascending colon, possibly reflective of the patient's fluid status though cannot exclude underlying enterocolitis. Correlate with symptoms. 3. Venous engorgement of the gonadal veins, which can be seen with pelvic congestion syndrome. 4. Retroverted fibroid uterus. 5. Bilateral fluid-filled inguinal hernias 6. Aortic Atherosclerosis (ICD10-I70.0). Electronically Signed   By: Lovena Le M.D.   On: 01/06/2019 21:13   Dg Chest Port 1 View  Result Date: 01/23/2019 CLINICAL DATA:  Cough. EXAM: PORTABLE CHEST 1 VIEW COMPARISON:  Radiograph 01/13/2019 FINDINGS: Enteric tube has been removed. Lower lung volumes from prior. Patient is rotated. Cardiomegaly is more prominent than on prior exam. Unchanged mediastinal contours with aortic atherosclerosis. Moderate bilateral pleural effusions and associated bibasilar opacities, left greater than right. No evidence of pulmonary edema. No confluent airspace disease. No pneumothorax. IMPRESSION: 1. Moderate bilateral pleural effusions with  associated bibasilar opacities, left greater than right, likely atelectasis. 2. Cardiomegaly is more prominent than on prior exam. Aortic Atherosclerosis (ICD10-I70.0). Electronically Signed   By: Keith Rake M.D.   On: 01/23/2019 19:30   Dg Chest Port 1 View  Result Date: 01/13/2019 CLINICAL DATA:  Patient is spitting up 2 feeds. Possible aspiration. EXAM: PORTABLE CHEST 1 VIEW COMPARISON:  Single-view of the chest 12/16/2018. FINDINGS: Feeding tube courses into the stomach and below the inferior margin of the film. The patient has moderate bilateral pleural effusions. Mild basilar atelectasis noted. There is cardiomegaly without edema. No pneumothorax. No acute or focal bony abnormality. IMPRESSION: Moderate layering pleural effusions with mild bibasilar atelectasis. Cardiomegaly without edema. Electronically Signed   By: Inge Rise M.D.   On: 01/13/2019 09:47   Dg Addison Bailey G Tube Plc W/fl-no Rad  Result Date: 01/17/2019 CLINICAL DATA:  84 year old female with a history of enteric tube placement EXAM: ABDOMEN - 1 VIEW; DG NASO G TUBE PLC W/FL-NO RAD COMPARISON:  January 13, 2019 FINDINGS: Limited plain film demonstrates post pyloric tube terminating in the 3/4 portion of the duodenum. IMPRESSION: Post pyloric feeding tube terminates in the 3/4 portion the duodenum. Electronically Signed   By: Corrie Mckusick D.O.   On: 01/17/2019 15:55   US Liver Doppler  Result Date: 01/13/2019 CLINICAL DATA:  85 year old female with elevated liver enzymes. Evaluate for portal vein stenosis/occlusion. EXAM: DUPLEX ULTRASOUND OF LIVER TECHNIQUE: Color and duplex Doppler ultrasound was performed to evaluate the hepatic in-flow and out-flow vessels. COMPARISON:  Prior CT abdomen/pelvis 01/06/2019 FINDINGS: Liver: Normal parenchymal echogenicity. Normal hepatic contour without nodularity. No focal lesion, mass or intrahepatic biliary ductal dilatation. Portal Vein Velocities Main:  35 cm/sec with normal hepatopetal  flow. Right:  23 cm/sec with normal hepatopetal flow. Left:  27 cm/sec with normal hepatopetal flow. Hepatic Vein Velocities Right:  16 cm/sec Middle:  27 cm/sec Left:  23 cm/sec IVC: Present and patent with normal respiratory phasicity. Hepatic Artery Velocity:  102 cm/sec Splenic Vein Velocity:  31 cm/sec Varices: Absent Ascites: Small volume ascites. Other: Bilateral pleural effusions are noted incidentally. IMPRESSION: 1. Patent and normal appearing portal and hepatic veins. 2. Small volume ascites. 3. Bilateral moderate pleural effusions. 4. Normal sonographic appearance of the liver. Electronically Signed   By: Jacqulynn Cadet M.D.   On: 01/13/2019 16:00     LOS: 40 days   Oren Binet, MD  [facility]  If 7PM-7AM, please contact night-coverage  Please page via [email or web address]  Go to [email or web address] and use [facility]'s universal password to access. If you do not have the password, please contact the hospital operator.  Locate the [facility] provider you are looking for under [facility] and page to a number that you can be directly reached. If you still have difficulty reaching the provider, please page the [facility] (Director on Call) for the Hospitalists listed on amion for assistance.  01/29/2019, 11:03 AM

## 2019-01-30 ENCOUNTER — Telehealth: Payer: Self-pay | Admitting: Internal Medicine

## 2019-01-30 ENCOUNTER — Telehealth: Payer: Self-pay

## 2019-01-30 ENCOUNTER — Inpatient Hospital Stay (HOSPITAL_COMMUNITY): Payer: Medicare Other

## 2019-01-30 DIAGNOSIS — R29898 Other symptoms and signs involving the musculoskeletal system: Secondary | ICD-10-CM | POA: Diagnosis not present

## 2019-01-30 DIAGNOSIS — R627 Adult failure to thrive: Secondary | ICD-10-CM | POA: Diagnosis not present

## 2019-01-30 LAB — GLUCOSE, CAPILLARY
Glucose-Capillary: 67 mg/dL — ABNORMAL LOW (ref 70–99)
Glucose-Capillary: 110 mg/dL — ABNORMAL HIGH (ref 70–99)

## 2019-01-30 LAB — ALDOLASE: Aldolase: 5.6 U/L (ref 3.3–10.3)

## 2019-01-30 MED ORDER — DEXTROSE 50 % IV SOLN
12.5000 g | INTRAVENOUS | Status: AC
  Administered 2019-01-30: 09:00:00 12.5 g via INTRAVENOUS

## 2019-01-30 MED ORDER — LOPERAMIDE HCL 2 MG PO CAPS
2.0000 mg | ORAL_CAPSULE | ORAL | Status: DC | PRN
  Filled 2019-01-30: qty 1

## 2019-01-30 MED ORDER — DEXTROSE 50 % IV SOLN
INTRAVENOUS | Status: AC
  Filled 2019-01-30: qty 50

## 2019-01-30 NOTE — Telephone Encounter (Signed)
I last saw him Ms. Boyadjian on 12/20/2018 so I cannot comment on her current condition.  I note that testing for C. difficile was negative 65-month ago.  I would encourage her daughter to speak with Dr. Sloan Leiter, her attending physician.

## 2019-01-30 NOTE — TOC Progression Note (Signed)
Transition of Care [facility]) - Progression Note    Patient Details  Name: NADA MEKEEL MRN: [id number] Date of Birth: 1931/02/07  [facility]) CM/SW Contact  Denman Pichardo, Edson Snowball, RN Phone Number: 01/30/2019, 2:46 PM  Clinical Narrative:    Damaris Schooner to MD earlier this am and with nurse , possible discharge  Zack with [facility] will call daughter for delivery DME. Valerie with [facility] aware.   Will complete PTAR paperwork and leave in shadow chart. Nurse aware  Expected Discharge Plan: [place] Barriers to Discharge: Continued Medical Work up  Expected Discharge Plan and Services Expected Discharge Plan: [place] In-house Referral: Ehtics Consult, Nutrition, Chaplain Discharge Planning Services: CM Consult Post Acute Care Choice: Durable Medical Equipment, Home Health Living arrangements for the past 2 months: [place]                 DME Arranged: Hospital bed, Wheelchair manual DME Agency: [employer] Date DME Agency Contacted: 01/25/19 Time DME Agency Contacted: [id number] Representative spoke with at DME Agency: zack HH Arranged: RN, PT [place] Agency: [place] Date [place]: 01/25/19 Time [place]: [id number] Representative spoke with at [place]: Tommi Rumps checking on cost of daily RN and PT visits   Social Determinants of Health (SDOH) Interventions    Readmission Risk Interventions No flowsheet data found.

## 2019-01-30 NOTE — Progress Notes (Addendum)
Nutrition Follow-up  DOCUMENTATION CODES:   Severe malnutrition in context of chronic illness  INTERVENTION:   -D/c calorie count (pt meeting 16% of estimated calorie needs, which is unchanged from previous calorie count completed 01/21/19-01/24/19) -Continue 30 ml Prostat BID, each supplement provides 100 kcals and 15 grams protein -Continue Boost Breeze po TID, each supplement provides 250 kcal and 9 grams of protein -Continue MVI with minerals daily -RD will continue to provide meals and snacks based upon pt preferences -If pt/family continues to desire aggressive care, will need to consider alternative means of nutrition/hydration due to prolonged suboptimal oral intake  NUTRITION DIAGNOSIS:   Severe Malnutrition related to chronic illness(TIA) as evidenced by energy intake < or equal to 75% for > or equal to 1 month, moderate fat depletion, severe fat depletion, moderate muscle depletion, severe muscle depletion.  Ongoing  GOAL:   Patient will meet greater than or equal to 90% of their needs  Unmet  MONITOR:   PO intake, Supplement acceptance, Diet advancement, Labs, Weight trends, Skin, I & O's  REASON FOR ASSESSMENT:   Consult Calorie Count  ASSESSMENT:   Sydney [name] is a 85 y.o. 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  Patient is accompanied by her daughter who assists with the history.  Patient has history of recurrent C. difficile colitis, has continued to have loose stools, though this has improved to 4 or 5/day, has had progressively worsening appetite, eating only 3 or 4 bites and unable to even drink half of a nutritional supplement drink.  Patient denies any abdominal pain since the recent  hospitalization, no fevers or chills have been noted, and she denies any dysuria though she may have complained of this a few days ago.  She was requiring assistance with ambulation and had home health PT set up at recent discharge, but has become even more weak in general since leaving the hospital and had a near syncopal episode today when she tried to get up from a bedside commode.  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 RN has not observed any difference in oral intake- pt continues to take only bites and sips at meals, which is amounting to insignificant intake. Per RN, pt consuming 5% of meals at best. Per RN caring for pt on Friday (01/27/19) when this calorie count was initiated, pt refused her breakfast and medications that morning.   Daughter not present in room at time of visit. Spoke with pt at bedside, who reports she wants "to get my shoes on and walk out of here". RN and RD attempted  to re-direct pt. Both RN and RD asked pt on separate occasions to lift up her legs off of the bed, which pt was unable to do. Also observed RN attempting to reposition pt in bed, which pt was unable to assist. Pt also requested sips of water, however, pt then refused several times when RD brought water cup  and offered to assist her. RD also offered other liquids off tray table (juice and gatorade), which pt also refused.   Calorie count (01/27/19-01/28/19)  Day 1 Breakfast: refused Lunch: 144 kcals, 7 grams protein Dinner: - Supplements: refused Prostat; sips of Boost Breeze (50 kcals, 2 grams protein)  Total intake: 194 kcal (16% of minimum estimated needs)  9 grams protein (15% of minimum estimated needs)  Day 2 Breakfast: - Lunch: 61 kcals, 3 grams protein Dinner: - Supplements: 1 Prostat supplement (100 kcals, 15 grams protein); sips of Boost Breeze (50 kcals, 2 grams protein)  Total intake: 211 kcal (17% of minimum estimated needs)  20 grAMS protein (33% of minimum estimated needs)  Average Total intake: 203 kcal (16% of minimum estimated needs)  15 grams protein (25% of minimum estimated needs)  Calorie count was completed on 01/21/19-01/24/19; this calorie count indicated that pt was consuming mainly bites and sips as well. Results confirmed pt meeting only 16% of estimated kcal needs and 8% of estimated protein needs.   RD re-evaluated nutrition-focused physical exam. Pt with severe fat and muscle depletions in all areas today (01/30/19). Exam has worsened from 01/10/19 (when pt progressed to criteria of severe malnutrition). Of note, pt met criteria for moderate malnutrition on admission (12/20/18). Nutritional status has declined significantly since admission secondary to prolonged periods of suboptimal PO intake. Per [facility] note from this AM, pt's coccyx wound has also worsened. Pt will need significant nutrition support to help support wound healing and improved nutritional status if aggressive care is still warranted.   Per previous discussions, pt and pt daughter do not desire to replace cortrak tube (which is only supported for temporary means of alternative nutrition/hydration). Pt is not a PEG candidate.   Per MD notes, plan to continue to engage daughter. Plan to await MRI  studies per neurology. Pt daughter has refused palliative care involvement.   Labs reviewed: CBGS: 67-110.   NUTRITION - FOCUSED PHYSICAL EXAM:    Most Recent Value  Orbital Region  Severe depletion  Upper Arm Region  Severe depletion  Thoracic and Lumbar Region  Severe depletion  Buccal Region  Severe depletion  Temple Region  Severe depletion  Clavicle Bone Region  Severe depletion  Clavicle and Acromion Bone Region  Severe depletion  Scapular Bone Region  Severe depletion  Dorsal Hand  Severe depletion  Patellar Region  Severe depletion  Anterior Thigh Region  Severe depletion  Posterior Calf Region  Severe depletion  Edema (RD Assessment)  None  Hair  Reviewed  Eyes  Reviewed  Mouth  Reviewed  Skin  Reviewed  Nails  Reviewed       Diet Order:   Diet Order            DIET DYS 3 Room service appropriate? Yes; Fluid consistency: Thin  Diet effective now              EDUCATION NEEDS:   Not appropriate for education at this time  Skin:  Skin Assessment: Skin Integrity Issues: Skin Integrity Issues:: Stage IV Stage II: sacrum Stage III: - Stage IV: coccyx- worsening per [facility] note on  01/30/19 Unstageable: -  Last BM:  01/30/19  Height:   Ht Readings from Last 1 Encounters:  12/20/18 _0  (1.6 m)    Weight:   Wt Readings from Last 1 Encounters:  01/25/19 60.4 kg    Ideal Body Weight:  52.3 kg  BMI:  Body mass index is 23.59 kg/m.  Estimated Nutritional Needs:   Kcal:  1250-1450  Protein:  60-75 grams  Fluid:  > 1.2 L    Dalicia Kisner A. Jimmye Norman, RD, LDN, [place] Registered Dietitian II Certified Diabetes Care and Education Specialist Pager: [phone] After hours Pager: [phone]

## 2019-01-30 NOTE — Progress Notes (Signed)
MRI of the femur/thigh-incomplete study without contrast concerning for non specific soft tissue edema. Would recommend muscle biopsy for tissue diagnosis if muscle inflammatory process is considered as a differential because treatment with steroids should only be pursued if there is a confirmed diagnosis given side effects of steroids, patients comorbidities and advanced age. -- Amie Portland, MD [facility] Pager: [phone] If 7pm to 7am, please call on call as listed on [facility]ION.

## 2019-01-30 NOTE — Consult Note (Addendum)
[place] Nurse wound consult note Patient is receiving care in [place].  Assisted with turning, repositioning for wound assessment by primary RN and NT. Reason for Consult: f/u coccyx wound Wound type: deteriorating coccyx wound, stage 4 with undermining circumferentially. Pressure Injury POA: Yes Measurement: 2 cm x 1.8 cm x 1.8 cm with the greatest undermining at 6 o'clock of 2.1 cm. Wound bed: mostly a gaping hole with a base of some yellow slough Drainage (amount, consistency, odor) brown on existing dressing Periwound: loose, impacted by diarrheal stools.  I understand from the primary RN report, the proposition of a fecal containment system, Flexiseal, was discussed with the daughter yesterday, and she refused. This is my overall assessment of the wound and the situation:  The wound has worsened and will continue to worsen, and it can be expected that the wound will likely cause her more pain as it worsens.  The patient is nutritionally depleted.  She appears to be in the final stages of life.  It is reported that the daughter will not accept this idea because the patient does not have a "diagnosis" to support that she is end of life.  If this type of conversation has not been had with the daughter, it could be helpful to do so. Dressing procedure/placement/frequency:  Continue the current therapy.  The care of the wound should be considered NOT from an attempt to heal it, but rather to maintain it to the greatest extent possible and to limit the pain associated with the wound care.  Home with Hospice could be a helpful plan of approach.  Thank you for the consult.  Discussed plan of care with the patient and bedside nurse.    [name], RN, MSN, CWOCN, CNS-BC, pager [phone]

## 2019-01-30 NOTE — Telephone Encounter (Signed)
FYI

## 2019-01-30 NOTE — Telephone Encounter (Signed)
Patient's daughter called office today stating her mother is currently admitted at [facility].  She states that her mother has been experiencing diarrhea recently, and is concerned with frequency of diarrhea. Daughter states her mother has not been on antibiotics in two weeks, and is worried the diarrhea could be something. Patient is expected to be discharged later today or tomorrow according to her daughter. Patient's daughter would like to make sure it is okay for her mother to be discharged while experiencing diarrhea. Will route message to Dr. Megan Salon and Janene Madeira, NP who saw patient in hospital.  Aundria Rud, [place]

## 2019-01-30 NOTE — Progress Notes (Signed)
PROGRESS NOTE        PATIENT DETAILS Name: Sydney [name] Age: 85 y.o. Sex: female Date of Birth: Sep 02, 1930 Admit Date: 12/19/2018 Admitting Physician Vianne Bulls, MD PCP:No primary care provider on file.  Brief Narrative: Patient is a 85 y.o. 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  See below for further details.  Subjective: Appears very uncomfortable-claims she has pain in her sacral wound.  Per RN staff-only a few bites of food this morning.  Per daughter-she had only a few bites yesterday.  She could not tolerate lying in the MRI for a long period of time-hence only MRI of the thoracic and cervical spine was done.  She is due for MRI of the left thigh today.  Patient continues to ask for discharge-she has been asking for discharge every day for the past 6 days that I have been taking care of her.  Patient told me (RN at bedside as well) that she is afraid of eating as-she is had some intermittent loose stools.  Assessment/Plan: Progressive generalized weakness associated with failure to thrive syndrome/weight loss: Slowly improving-exhaustive work-up remains negative to date.  Main issue at this point is patient's poor appetite.  Not sure if she can sustain herself with oral intake at this point-daughter does not want to consider NG tube placement or PEG tube placement.  She is reluctant to take patient home with hospice care.  I do not know if there are any other options regarding her appetite at this point.   Etiology of progressive weakness/weight loss/failure to thrive syndrome remains unknown however TSH/a.m. cortisol/blood cultures all unremarkable.  CT of the abdomen without any obvious  malignancy.  MRI brain without any obvious etiology.  Endoscopic evaluation was contemplated but given patient's tenuous clinical state-and suspicion of low yield for diagnoses-not pursued.  Not sure if a transient hepatitis-in a cholestatic pattern (?  Drug-induced hepatitis) could have caused this much of weakness.  It seems like with improvement in LFTs-her mentation/appetite (albeit transiently) seems to have improved somewhat.  Appreciate repeat GI note on 10/2 and neurology note on 10/3-due to concern of spinal cord injury-assess MRI of the T and C-spine was done-this does not show any acute abnormalities that could cause her to have progressive weakness.  Due to some concern for myositis-MRI of the thigh has been ordered-it is currently pending.  Aldolase levels pending as well.  Unfortunately-I am not sure if we have one uniform diagnosis that could explain her symptomatology.   For now continue with Megace, mirtazapine and other supportive care.  I have asked nursing staff to continue to assist with feedings-and to encourage nutritional supplements.  Will await MRI of the thigh results-but suspect her overall prognosis is pretty poor at this point.  Unfortunately patient's daughter refused to sit down and engage with the palliative care team.  I spoke with the patient's daughter-she does not want to consider going home with hospice at this point.  If the MRI of the left thigh is without any major abnormalities-and if she continues to exhibit poor appetite/FTT-do not know if there is anything else that we could do different at this point.  I suspect she has met maximal benefit from a hospital stay (especially if the MRI thigh is negative)-  I have suggested to the daughter that it may be reasonable to get her home to see if her appetite/overall spirit improve-when she is back home-knowing fully well that if she were to worsen-get dehydrated-she may need to come back to the hospital.  Her overall  prognosis was obviously poor.    Acute metabolic encephalopathy: Etiology uncertain-see above-however she has improved this morning-is hard of hearing but able to answer most of my questions appropriately.  Transaminitis: Mostly in a cholestatic pattern-viral hepatitis serology including CMV/EBV are negative.  Autoimmune hepatitis panel negative.  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  Thankfully LFTs have almost normalized-as noted above-not sure if a drug-induced hepatitis  (?which drug) process can cause this much of encephalopathy (ammonia within normal limits) and severe failure to thrive syndrome.  CVA: Has generalized weakness but nonfocal-continue aspirin-if clinical improvement continues and no further procedures are contemplated-suspect we could resume Plavix soon.  Hypokalemia/hypomagnesemia: Continue to replete and recheck  Odynophagia: Improving-continue nystatin.  Thrombocytopenia: Resolved-suspect was secondary to transient hepatitis.  Anemia: Likely secondary to acute illness  HTN: Blood pressure controlled without the use of any antihypertensives.  History of C. difficile colitis s/p stool transplantation: C. difficile PCR negative on 9/8-patient continues to have intermittent loose stools-we will try Imodium as needed.  Sacral decubitus ulcer stage 4: Continue local wound care-appreciate wound care RN input.  She continues to have discomfort at the sacral site-tramadol has been ordered as needed-but daughter is not keen on giving the patient tramadol due to sedation concerns.  If patient appears very uncomfortable-and pain is severe-we may need to engage the family further.   Deconditioning/debility: Secondary to prolonged hospitalization/acute illness-reconsult PT-probably will require several weeks/months before patient can regain prior functional status.  Daughter is not interested in SNF-she wants to take patient home with maximum home  health services.  Nutrition Problem: Nutrition Problem: Severe Malnutrition Etiology: chronic illness(TIA) Signs/Symptoms: energy intake < or equal to 75% for > or equal to 1 month, moderate fat depletion, severe fat depletion, moderate muscle depletion, severe muscle depletion Interventions: Tube feeding (now discontinued)   Diet: Diet Order            DIET DYS 3 Room service appropriate? Yes; Fluid consistency: Thin  Diet effective now               DVT Prophylaxis: Prophylactic Lovenox   Code Status: Full code   Family Communication: Daughter at bedside on 10/5  Disposition Plan: Remain inpatient-daughter refuses SNF-we will maximize home health services on discharge.   Antimicrobial agents: Anti-infectives (From admission, onward)   Start     Dose/Rate Route Frequency Ordered Stop   01/06/19 2200  cephALEXin (KEFLEX) 250 MG/5ML suspension 500 mg     500 mg Per Tube Every 12 hours 01/06/19 1734 01/08/19 2141   01/06/19 1045  ceFAZolin (ANCEF) IVPB 1 g/50 mL premix  Status:  Discontinued     1 g 100 mL/hr over 30 Minutes Intravenous Every 12 hours 01/06/19 1037 01/06/19 1734   01/06/19 1000  vancomycin (VANCOCIN) 50 mg/mL oral solution 125 mg     125 mg Per Tube Every 12 hours 01/06/19 0849 01/08/19 1226   01/05/19 1200  cefTRIAXone (ROCEPHIN) 1 g in sodium chloride 0.9 % 100 mL IVPB  Status:  Discontinued     1 g 200 mL/hr over 30 Minutes  Intravenous Every 24 hours 01/05/19 1125 01/06/19 1037      Procedures: None  CONSULTS:  GI and hematology/oncology  Time spent: 25- minutes-Greater than 50% of this time was spent in counseling, explanation of diagnosis, planning of further management, and coordination of care.  MEDICATIONS: Scheduled Meds:  aspirin EC  81 mg Oral Daily   chlorhexidine  15 mL Mouth Rinse BID   dextrose       enoxaparin (LOVENOX) injection  40 mg Subcutaneous Q24H   feeding supplement  1 Container Oral TID BM   feeding  supplement (PRO-STAT SUGAR FREE 64)  30 mL Oral BID WC   Gerhardt's butt cream   Topical TID   latanoprost  1 drop Both Eyes QHS   mouth rinse  15 mL Mouth Rinse q12n4p   megestrol  400 mg Oral TID   mirtazapine  30 mg Oral QHS   multivitamin with minerals  1 tablet Oral Daily   nystatin  5 mL Oral 5 X Daily   nystatin   Topical BID   pantoprazole  40 mg Oral Daily   sodium chloride flush  3 mL Intravenous Q12H   Continuous Infusions:  sodium chloride     PRN Meds:.sodium chloride, acetaminophen **OR** acetaminophen, guaiFENesin-dextromethorphan, hydroxypropyl methylcellulose / hypromellose, magic mouthwash, [DISCONTINUED] ondansetron **OR** ondansetron (ZOFRAN) IV, sodium chloride flush, traMADol   PHYSICAL EXAM: Vital signs: Vitals:   01/29/19 1300 01/29/19 1600 01/29/19 2148 01/30/19 0523  BP: 135/80 120/70 131/84 (!) 145/79  Pulse: 95 91 96 100  Resp: (!) 24 (!) 21 20 (!) 22  Temp: 98.1 F (36.7 C) 99.5 F (37.5 C) 98.1 F (36.7 C) 98.9 F (37.2 C)  TempSrc: Oral Oral Oral Oral  SpO2:   100% 98%  Weight:      Height:       Filed Weights   01/21/19 0500 01/24/19 0500 01/25/19 0500  Weight: 60 kg 60.4 kg 60.4 kg   Body mass index is 23.59 kg/m.  Gen Exam:Alert awake-mild distress due to pain at the sacral site.  She appears very frail and very weak. HEENT:atraumatic, normocephalic Chest: B/L clear to auscultation anteriorly CVS:S1S2 regular Abdomen:soft non tender, non distended Extremities: Trace edema Neurology: She has significant generalized weakness-but is nonfocal. Skin: no rash I have personally reviewed following labs and imaging studies  LABORATORY DATA: CBC: Recent Labs  Lab 01/24/19 0224 01/26/19 0139 01/29/19 1503  WBC 4.5 4.3 7.3  NEUTROABS 3.6  --   --   HGB 9.2* 9.8* 10.6*  HCT 28.5* 29.6* 33.3*  MCV 99.0 97.4 100.3*  PLT 217 248 740    Basic Metabolic Panel: Recent Labs  Lab 01/24/19 0224 01/26/19 0139 01/27/19 0525  01/28/19 0330 01/29/19 0855  NA 140 134* 136 135 135  K 3.1* 3.0* 3.7 3.2* 4.1  CL 113* 110 110 110 113*  CO2 17* 20* 17* 18* 14*  GLUCOSE 72 84 69* 87 98  BUN '22 16 13 16 14  ' CREATININE 0.68 0.73 0.70 0.74 0.75  CALCIUM 7.7* 7.7* 7.7* 7.5* 7.6*  MG  --  1.7 2.1  --  2.1  PHOS  --  2.5  --   --   --     GFR: Estimated Creatinine Clearance: 41.8 mL/min (by C-G formula based on SCr of 0.75 mg/dL).  Liver Function Tests: Recent Labs  Lab 01/24/19 0224 01/26/19 0139 01/27/19 0525 01/28/19 0330 01/29/19 0855  AST 69* 54* 47* 44* 51*  ALT 59* 49* 43 37 34  ALKPHOS 211* 191* 186* 168* 159*  BILITOT 1.3* 0.8 0.8 0.4 1.1  PROT 5.5* 5.8* 5.4* 5.2* 5.8*  ALBUMIN 1.9* 1.9* 1.9* 1.8* 1.9*   No results for input(s): LIPASE, AMYLASE in the last 168 hours. No results for input(s): AMMONIA in the last 168 hours.  Coagulation Profile: No results for input(s): INR, PROTIME in the last 168 hours.  Cardiac Enzymes: Recent Labs  Lab 01/28/19 0330  CKTOTAL 72    BNP (last 3 results) No results for input(s): PROBNP in the last 8760 hours.  HbA1C: No results for input(s): HGBA1C in the last 72 hours.  CBG: Recent Labs  Lab 01/29/19 0813 01/29/19 1208 01/29/19 1339 01/30/19 0811 01/30/19 0910  GLUCAP 110* 69* 107* 67* 110*    Lipid Profile: No results for input(s): CHOL, HDL, LDLCALC, TRIG, CHOLHDL, LDLDIRECT in the last 72 hours.  Thyroid Function Tests: No results for input(s): TSH, T4TOTAL, FREET4, T3FREE, THYROIDAB in the last 72 hours.  Anemia Panel: No results for input(s): VITAMINB12, FOLATE, FERRITIN, TIBC, IRON, RETICCTPCT in the last 72 hours.  Urine analysis:    Component Value Date/Time   COLORURINE YELLOW 01/08/2019 2015   APPEARANCEUR HAZY (A) 01/08/2019 2015   LABSPEC 1.018 01/08/2019 2015   PHURINE 6.0 01/08/2019 2015   GLUCOSEU NEGATIVE 01/08/2019 2015   HGBUR SMALL (A) 01/08/2019 2015   BILIRUBINUR NEGATIVE 01/08/2019 2015   KETONESUR NEGATIVE  01/08/2019 2015   PROTEINUR NEGATIVE 01/08/2019 2015   UROBILINOGEN 0.2 04-12-202014 1048   NITRITE NEGATIVE 01/08/2019 2015   LEUKOCYTESUR NEGATIVE 01/08/2019 2015    Sepsis Labs: Lactic Acid, Venous    Component Value Date/Time   LATICACIDVEN 1.4 12/20/2018 0012    MICROBIOLOGY: No results found for this or any previous visit (from the past 240 hour(s)).  RADIOLOGY STUDIES/RESULTS: Dg Abd 1 View  Result Date: 01/17/2019 CLINICAL DATA:  85 year old female with a history of enteric tube placement EXAM: ABDOMEN - 1 VIEW; DG NASO G TUBE PLC W/FL-NO RAD COMPARISON:  January 13, 2019 FINDINGS: Limited plain film demonstrates post pyloric tube terminating in the 3/4 portion of the duodenum. IMPRESSION: Post pyloric feeding tube terminates in the 3/4 portion the duodenum. Electronically Signed   By: Corrie Mckusick D.O.   On: 01/17/2019 15:55   Dg Abd 1 View  Result Date: 01/13/2019 CLINICAL DATA:  Blocked feeding tube. EXAM: ABDOMEN - 1 VIEW COMPARISON:  12/29/2018 FINDINGS: Enteric fusion tube is present with tip over the region of the distal stomach just right of midline in the mid abdomen. Bowel gas pattern is nonobstructive. Remainder of the exam is unchanged. IMPRESSION: Nonobstructive bowel gas pattern. Enteric feeding tube with tip right of midline in the mid abdomen likely over the distal stomach. Electronically Signed   By: Marin Olp M.D.   On: 01/13/2019 12:28   Mr Cervical Spine Wo Contrast  Result Date: 01/29/2019 CLINICAL DATA:  85 year old female with ataxia. Unexplained lower extremity weakness, spasticity. EXAM: MRI CERVICAL SPINE WITHOUT CONTRAST TECHNIQUE: Multiplanar, multisequence MR imaging of the cervical spine was performed. No intravenous contrast was administered. COMPARISON:  Brain MRI 12/21/2018.  Neck CT 10/13/2017. FINDINGS: Alignment: Preserved cervical lordosis similar to the 2019 neck CT. No spondylolisthesis. Vertebrae: No marrow edema or evidence of acute  osseous abnormality. Visualized bone marrow signal is within normal limits. Cord: Suboptimal spinal cord detail due to motion artifact despite repeated imaging attempts, but no convincing cervical or upper thoracic spinal cord signal abnormality. Fairly capacious spinal canal. Posterior Fossa, vertebral arteries,  paraspinal tissues: Stable cervicomedullary junction and visible posterior fossa. Preserved major vascular flow voids in the neck, the left vertebral artery appears mildly dominant. Disc levels: Mild for age cervical spine degeneration with no cervical or upper thoracic spinal stenosis. There is i generally mild cervical neural foraminal stenosis despite some levels of facet hypertrophy. There is up to moderate stenosis at the left C4 nerve level. IMPRESSION: Essentially negative for age MRI appearance of the cervical spine. No spinal stenosis, and no cord signal abnormality identified although cord detail is suboptimal due to motion. Electronically Signed   By: Genevie Ann M.D.   On: 01/29/2019 15:12   Mr Thoracic Spine Wo Contrast  Result Date: 01/29/2019 CLINICAL DATA:  85 year old female with ataxia. Unexplained lower extremity weakness, spasticity. EXAM: MRI THORACIC SPINE WITHOUT CONTRAST TECHNIQUE: Multiplanar, multisequence MR imaging of the thoracic spine was performed. No intravenous contrast was administered. COMPARISON:  Cervical spine MRI today reported separately. FINDINGS: The examination had to be discontinued prior to completion due to patient pain. Only 1 set of axial images was obtained, and the study is intermittently degraded by motion. Limited cervical spine imaging:  Reported separately today. Thoracic spine segmentation:  Appears to be normal. Alignment: Mildly exaggerated thoracic kyphosis. No spondylolisthesis. Vertebrae: No marrow edema or evidence of acute osseous abnormality. Visualized bone marrow signal is within normal limits. Cord: No definite thoracic spinal cord signal  abnormality is identified. The conus appears normal at T11-T12. There is questionable volume loss of the thoracic cord, somewhat generalized (e.g. So at the T2-T3 level series 22, image 8, and again at the T8-T9 level series 22, image 25). As in the cervical spine, the spinal canal is capacious. Paraspinal and other soft tissues: Small to moderate layering left left greater than right pleural effusions. Posterior paraspinal soft tissues appear within normal limits. Disc levels: Mild for age thoracic spine degeneration similar to that in the cervical spine. No spinal or foraminal stenosis. IMPRESSION: 1. Questionable generalized thoracic spinal cord volume loss, but no definite focal cord signal abnormality. Cord detail is suboptimal due to motion. 2. Mild for age thoracic spine degeneration and capacious spinal canal. 3. Left greater than right layering pleural effusions. Electronically Signed   By: Genevie Ann M.D.   On: 01/29/2019 15:25   Ct Abdomen Pelvis W Contrast  Result Date: 01/06/2019 CLINICAL DATA:  Abdominal pain EXAM: CT ABDOMEN AND PELVIS WITH CONTRAST TECHNIQUE: Multidetector CT imaging of the abdomen and pelvis was performed using the standard protocol following bolus administration of intravenous contrast. CONTRAST:  156m OMNIPAQUE IOHEXOL 300 MG/ML  SOLN COMPARISON:  CT December 20, 2018 FINDINGS: Lower chest: Moderate bilateral effusions with adjacent areas of passive atelectasis. Mild airways thickening is present. Normal heart size. No pericardial effusion. Hepatobiliary: Stable 11 mm hepatic cyst. No concerning liver abnormality is seen. No calcified gallstones, gallbladder wall thickening or biliary ductal dilatation. Pancreas: Unremarkable. No pancreatic ductal dilatation or surrounding inflammatory changes. Spleen: Diminutive spleen. Otherwise unremarkable. Adrenals/Urinary Tract: Adrenal glands are unremarkable. Kidneys are normal, without renal calculi, focal lesion, or hydronephrosis.  Urinary bladder is distended but otherwise unremarkable. Stomach/Bowel: Transesophageal tube tip terminates at the level of the gastric antrum. There is edematous mural thickening of much of the small bowel and more focally pronounced in the cecum and ascending colon. Distal colon assumes a less thickened appearance. The appendix is not well visualized. Vascular/Lymphatic: Atherosclerotic plaque within the normal caliber aorta. Portal and hepatic veins are patent. Venous engorgement of the gonadal veins.  No suspicious or enlarged lymph nodes in the included lymphatic chains. Reproductive: Parametrial calcifications and engorged gonadal veins, as above. Few scattered calcified pelvic fibroids within a retroverted uterus. No discernible adnexal lesions. Other: Moderate volume of free intraperitoneal ascites with diffusely increased attenuation of the mesenteric leaflets and fluid tracking and bilateral inguinal hernias. Extensive circumferential body wall edema is present as well. As well as milder edematous changes in the soft tissues of the imaged extremities. No free intraperitoneal air Musculoskeletal: Degenerative changes present in both wrists. Multilevel degenerative changes are present in the imaged portions of the spine. Disc bulges present at L3-4 and L4-5. Vacuum disc noted at L3-4 as well. IMPRESSION: 1. Moderate volume of free intraperitoneal ascites, bilateral effusions, circumferential body wall and extremity edema compatible with anasarca. 2. Edematous mural thickening of much of the small bowel, more focally pronounced in the cecum and ascending colon, possibly reflective of the patient's fluid status though cannot exclude underlying enterocolitis. Correlate with symptoms. 3. Venous engorgement of the gonadal veins, which can be seen with pelvic congestion syndrome. 4. Retroverted fibroid uterus. 5. Bilateral fluid-filled inguinal hernias 6. Aortic Atherosclerosis (ICD10-I70.0). Electronically Signed    By: Lovena Le M.D.   On: 01/06/2019 21:13   Dg Chest Port 1 View  Result Date: 01/23/2019 CLINICAL DATA:  Cough. EXAM: PORTABLE CHEST 1 VIEW COMPARISON:  Radiograph 01/13/2019 FINDINGS: Enteric tube has been removed. Lower lung volumes from prior. Patient is rotated. Cardiomegaly is more prominent than on prior exam. Unchanged mediastinal contours with aortic atherosclerosis. Moderate bilateral pleural effusions and associated bibasilar opacities, left greater than right. No evidence of pulmonary edema. No confluent airspace disease. No pneumothorax. IMPRESSION: 1. Moderate bilateral pleural effusions with associated bibasilar opacities, left greater than right, likely atelectasis. 2. Cardiomegaly is more prominent than on prior exam. Aortic Atherosclerosis (ICD10-I70.0). Electronically Signed   By: Keith Rake M.D.   On: 01/23/2019 19:30   Dg Chest Port 1 View  Result Date: 01/13/2019 CLINICAL DATA:  Patient is spitting up 2 feeds. Possible aspiration. EXAM: PORTABLE CHEST 1 VIEW COMPARISON:  Single-view of the chest 12/16/2018. FINDINGS: Feeding tube courses into the stomach and below the inferior margin of the film. The patient has moderate bilateral pleural effusions. Mild basilar atelectasis noted. There is cardiomegaly without edema. No pneumothorax. No acute or focal bony abnormality. IMPRESSION: Moderate layering pleural effusions with mild bibasilar atelectasis. Cardiomegaly without edema. Electronically Signed   By: Inge Rise M.D.   On: 01/13/2019 09:47   Dg Addison Bailey G Tube Plc W/fl-no Rad  Result Date: 01/17/2019 CLINICAL DATA:  85 year old female with a history of enteric tube placement EXAM: ABDOMEN - 1 VIEW; DG NASO G TUBE PLC W/FL-NO RAD COMPARISON:  January 13, 2019 FINDINGS: Limited plain film demonstrates post pyloric tube terminating in the 3/4 portion of the duodenum. IMPRESSION: Post pyloric feeding tube terminates in the 3/4 portion the duodenum. Electronically Signed    By: Corrie Mckusick D.O.   On: 01/17/2019 15:55   US Liver Doppler  Result Date: 01/13/2019 CLINICAL DATA:  85 year old female with elevated liver enzymes. Evaluate for portal vein stenosis/occlusion. EXAM: DUPLEX ULTRASOUND OF LIVER TECHNIQUE: Color and duplex Doppler ultrasound was performed to evaluate the hepatic in-flow and out-flow vessels. COMPARISON:  Prior CT abdomen/pelvis 01/06/2019 FINDINGS: Liver: Normal parenchymal echogenicity. Normal hepatic contour without nodularity. No focal lesion, mass or intrahepatic biliary ductal dilatation. Portal Vein Velocities Main:  35 cm/sec with normal hepatopetal flow. Right:  23 cm/sec with normal hepatopetal flow.  Left:  27 cm/sec with normal hepatopetal flow. Hepatic Vein Velocities Right:  16 cm/sec Middle:  27 cm/sec Left:  23 cm/sec IVC: Present and patent with normal respiratory phasicity. Hepatic Artery Velocity:  102 cm/sec Splenic Vein Velocity:  31 cm/sec Varices: Absent Ascites: Small volume ascites. Other: Bilateral pleural effusions are noted incidentally. IMPRESSION: 1. Patent and normal appearing portal and hepatic veins. 2. Small volume ascites. 3. Bilateral moderate pleural effusions. 4. Normal sonographic appearance of the liver. Electronically Signed   By: Jacqulynn Cadet M.D.   On: 01/13/2019 16:00     LOS: 41 days   Oren Binet, MD  [facility]  If 7PM-7AM, please contact night-coverage  Please page via [email or web address]  Go to [email or web address] and use [place]'s universal password to access. If you do not have the password, please contact the hospital operator.  Locate the [facility] provider you are looking for under [facility] and page to a number that you can be directly reached. If you still have difficulty reaching the provider, please page the [facility] (Director on Call) for the Hospitalists listed on amion for assistance.  01/30/2019, 12:03 PM

## 2019-01-30 NOTE — Plan of Care (Signed)
  Problem: Pain Managment: Goal: General experience of comfort will improve Outcome: Progressing   Problem: Safety: Goal: Ability to remain free from injury will improve Outcome: Progressing   Problem: Skin Integrity: Goal: Risk for impaired skin integrity will decrease Outcome: Progressing   

## 2019-01-30 NOTE — Plan of Care (Signed)
  Problem: Health Behavior/Discharge Planning: Goal: Ability to manage health-related needs will improve Outcome: Progressing   

## 2019-01-30 NOTE — Progress Notes (Addendum)
Neurology Progress Note   S:// Seen and examined.  No acute changes.   O:// Current vital signs: BP (!) 145/79 (BP Location: Left Arm)   Pulse 100   Temp 98.9 F (37.2 C) (Oral)   Resp (!) 22   Ht '5\' 3"'  (1.6 m)   Wt 60.4 kg   SpO2 98%   BMI 23.59 kg/m  Vital signs in last 24 hours: Temp:  [98.1 F (36.7 C)-99.5 F (37.5 C)] 98.9 F (37.2 C) (10/05 0523) Pulse Rate:  [91-107] 100 (10/05 0523) Resp:  [20-26] 22 (10/05 0523) BP: (120-145)/(64-84) 145/79 (10/05 0523) SpO2:  [98 %-100 %] 98 % (10/05 0523) Neurological exam Mental status: Awake alert.  Unaware of today's date or year.  Extremely hard of hearing.  Follows verbal commands-simple commands consistently, complex commands with some difficulty.  Extremely poor attention concentration. Speech and language: Mildly dysarthric, poor attention concentration precludes a good exam but she is able to name simple objects and repeat. Cranial nerves: Pupils equal round react to light, extraocular movements appear intact, visual fields are full, face appears symmetric. Motor exam: Both upper extremities 4+/5 proximally and 5/5 distally.  Lower extremity with increased tone bilaterally with extensor more than flexor weakness-as previously reported exam-no change. Sensory: Intact light touch on both legs with decreased vibratory sense in a stocking type distribution.  No sensory level. DTRs: 2+ at biceps, 2+ brachioradialis, 2+ on the knees, 1+ on ankles bilaterally. Toes are downgoing bilaterally.   Medications  Current Facility-Administered Medications:  .  0.9 %  sodium chloride infusion, 250 mL, Intravenous, PRN, Opyd, Ilene Qua, MD .  acetaminophen (TYLENOL) tablet 650 mg, 650 mg, Per NG tube, Q6H PRN, 650 mg at 01/29/19 1510 **OR** acetaminophen (TYLENOL) suppository 650 mg, 650 mg, Rectal, Q6H PRN, Little Ishikawa, MD .  aspirin EC tablet 81 mg, 81 mg, Oral, Daily, Shahmehdi, Seyed A, MD, 81 mg at 01/29/19 1021 .   chlorhexidine (PERIDEX) 0.12 % solution 15 mL, 15 mL, Mouth Rinse, BID, Little Ishikawa, MD, 15 mL at 01/29/19 2327 .  dextrose 50 % solution, , , ,  .  enoxaparin (LOVENOX) injection 40 mg, 40 mg, Subcutaneous, Q24H, Allie Bossier, MD, 40 mg at 01/29/19 2325 .  feeding supplement (BOOST / RESOURCE BREEZE) liquid 1 Container, 1 Container, Oral, TID BM, Danford, Suann Larry, MD, 1 Container at 01/29/19 2328 .  feeding supplement (PRO-STAT SUGAR FREE 64) liquid 30 mL, 30 mL, Oral, BID WC, Shahmehdi, Seyed A, MD, 30 mL at 01/29/19 1028 .  Gerhardt's butt cream, , Topical, TID, Pahwani, Ravi, MD .  guaiFENesin-dextromethorphan (ROBITUSSIN DM) 100-10 MG/5ML syrup 5 mL, 5 mL, Oral, Q4H PRN, Danford, Suann Larry, MD, 5 mL at 01/26/19 0602 .  hydroxypropyl methylcellulose / hypromellose (ISOPTO TEARS / GONIOVISC) 2.5 % ophthalmic solution 1 drop, 1 drop, Both Eyes, TID PRN, Danford, Christopher P, MD .  latanoprost (XALATAN) 0.005 % ophthalmic solution 1 drop, 1 drop, Both Eyes, QHS, [place] Donnamarie Poag, DO, 1 drop at 01/29/19 2323 .  magic mouthwash, 5 mL, Oral, QID PRN, Starla Link, Kshitiz, MD, 5 mL at 01/14/19 1543 .  MEDLINE mouth rinse, 15 mL, Mouth Rinse, q12n4p, Little Ishikawa, MD, 15 mL at 01/27/19 1646 .  megestrol (MEGACE) 400 MG/10ML suspension 400 mg, 400 mg, Oral, TID, Shahmehdi, Seyed A, MD, 400 mg at 01/29/19 2324 .  mirtazapine (REMERON SOL-TAB) disintegrating tablet 30 mg, 30 mg, Oral, QHS, Shahmehdi, Seyed A, MD, 30 mg at 01/29/19 2325 .  multivitamin with minerals tablet 1 tablet, 1 tablet, Oral, Daily, Shahmehdi, Seyed A, MD, 1 tablet at 01/29/19 1027 .  nystatin (MYCOSTATIN) 100000 UNIT/ML suspension 500,000 Units, 5 mL, Oral, 5 X Daily, Allie Bossier, MD, 500,000 Units at 01/29/19 2329 .  nystatin (MYCOSTATIN/NYSTOP) topical powder, , Topical, BID, Shahmehdi, Seyed A, MD .  [DISCONTINUED] ondansetron (ZOFRAN) tablet 4 mg, 4 mg, Per NG tube, Q6H PRN **OR** ondansetron (ZOFRAN)  injection 4 mg, 4 mg, Intravenous, Q6H PRN, Little Ishikawa, MD .  pantoprazole (PROTONIX) EC tablet 40 mg, 40 mg, Oral, Daily, Shahmehdi, Seyed A, MD, 40 mg at 01/29/19 1024 .  sodium chloride flush (NS) 0.9 % injection 3 mL, 3 mL, Intravenous, Q12H, Opyd, Ilene Qua, MD, 3 mL at 01/27/19 0829 .  sodium chloride flush (NS) 0.9 % injection 3 mL, 3 mL, Intravenous, PRN, Opyd, Ilene Qua, MD .  traMADol Veatrice Bourbon) tablet 25 mg, 25 mg, Oral, Q6H PRN, Jonetta Osgood, MD Labs CBC    Component Value Date/Time   WBC 7.3 01/29/2019 1503   RBC 3.32 (L) 01/29/2019 1503   HGB 10.6 (L) 01/29/2019 1503   HGB 10.7 (L) 05/01/2015 1423   HCT 33.3 (L) 01/29/2019 1503   HCT 39.3 10/03/2017 0827   PLT 257 01/29/2019 1503   PLT 321 05/01/2015 1423   MCV 100.3 (H) 01/29/2019 1503   MCV 92 05/01/2015 1423   MCH 31.9 01/29/2019 1503   MCHC 31.8 01/29/2019 1503   RDW 18.6 (H) 01/29/2019 1503   RDW 13.0 05/01/2015 1423   LYMPHSABS 0.3 (L) 01/24/2019 0224   LYMPHSABS 1.0 05/01/2015 1423   MONOABS 0.5 01/24/2019 0224   EOSABS 0.0 01/24/2019 0224   EOSABS 0.0 05/01/2015 1423   BASOSABS 0.0 01/24/2019 0224   BASOSABS 0.0 05/01/2015 1423    CMP     Component Value Date/Time   NA 135 01/29/2019 0855   K 4.1 01/29/2019 0855   CL 113 (H) 01/29/2019 0855   CO2 14 (L) 01/29/2019 0855   GLUCOSE 98 01/29/2019 0855   BUN 14 01/29/2019 0855   CREATININE 0.75 01/29/2019 0855   CREATININE 0.67 01/03/2014 1111   CALCIUM 7.6 (L) 01/29/2019 0855   PROT 5.8 (L) 01/29/2019 0855   ALBUMIN 1.9 (L) 01/29/2019 0855   AST 51 (H) 01/29/2019 0855   ALT 34 01/29/2019 0855   ALKPHOS 159 (H) 01/29/2019 0855   BILITOT 1.1 01/29/2019 0855   GFRNONAA >60 01/29/2019 0855   GFRAA >60 01/29/2019 0855  B12 435 ESR 65 CRP 2.8  Imaging I have reviewed images in epic and the results pertinent to this consultation are: MRI examination of the cervical and thoracic spine with general volume loss of the thoracic spinal  cord volume but no discernible lesion to explain the current symptoms.  Motion riddled thoracic spine MRI limited a complete evaluation.  Assessment: 85 year old woman with gait dysfunction that started acutely in August.  Based on the exam with lower extremity increased tone and more extensive weakness then flexor, suspicion for a spinal cord insult but no imaging evidence of stroke or transverse myelitis. Other differentials include inflammatory processes such as myopathy/myositis for which an MRI of the thigh with contrast is pending to assess for any areas of inflammation. Given her overall frail situation, elevated ESR and CRP and advanced age, polymyalgia rheumatica can also be considered in differentials but the focal lower extremity weakness much more significant the upper extremity weakness does not bode well with the diagnosis of a  polymyalgia rheumatica-like picture.  Impression: Evaluate for lower extremity weakness  Recommendations: -Pending exam-MRI thigh with contrast for evidence of inflammation. Aldolase pending -If that is positive for any evidence of inflammation, would recommend a muscle biopsy to get a definitive diagnosis. -Since her symptoms have now been going on for about 4 to 6 weeks, she would be a good candidate to get an outpatient EMG as well as nerve conduction studies to see if we can get an electrographic aid to the diagnosis. -If it turns out that the etiology is inflammatory, given the increased acute phase reactants, would consider using steroids.  -- Amie Portland, MD [facility] Pager: [phone] If 7pm to 7am, please call on call as listed on AMION.

## 2019-01-31 DIAGNOSIS — F0390 Unspecified dementia without behavioral disturbance: Secondary | ICD-10-CM

## 2019-01-31 DIAGNOSIS — R195 Other fecal abnormalities: Secondary | ICD-10-CM

## 2019-01-31 DIAGNOSIS — R627 Adult failure to thrive: Secondary | ICD-10-CM | POA: Diagnosis not present

## 2019-01-31 DIAGNOSIS — R7989 Other specified abnormal findings of blood chemistry: Secondary | ICD-10-CM | POA: Diagnosis not present

## 2019-01-31 DIAGNOSIS — N183 Chronic kidney disease, stage 3 unspecified: Secondary | ICD-10-CM

## 2019-01-31 DIAGNOSIS — R109 Unspecified abdominal pain: Secondary | ICD-10-CM

## 2019-01-31 DIAGNOSIS — L89159 Pressure ulcer of sacral region, unspecified stage: Secondary | ICD-10-CM | POA: Diagnosis not present

## 2019-01-31 DIAGNOSIS — E538 Deficiency of other specified B group vitamins: Secondary | ICD-10-CM

## 2019-01-31 DIAGNOSIS — E440 Moderate protein-calorie malnutrition: Secondary | ICD-10-CM | POA: Diagnosis not present

## 2019-01-31 DIAGNOSIS — E871 Hypo-osmolality and hyponatremia: Secondary | ICD-10-CM | POA: Diagnosis not present

## 2019-01-31 DIAGNOSIS — Z6823 Body mass index (BMI) 23.0-23.9, adult: Secondary | ICD-10-CM

## 2019-01-31 LAB — COMPREHENSIVE METABOLIC PANEL
ALT: 27 U/L (ref 0–44)
AST: 39 U/L (ref 15–41)
Albumin: 2 g/dL — ABNORMAL LOW (ref 3.5–5.0)
Alkaline Phosphatase: 161 U/L — ABNORMAL HIGH (ref 38–126)
Anion gap: 10 (ref 5–15)
BUN: 14 mg/dL (ref 8–23)
CO2: 15 mmol/L — ABNORMAL LOW (ref 22–32)
Calcium: 7.9 mg/dL — ABNORMAL LOW (ref 8.9–10.3)
Chloride: 108 mmol/L (ref 98–111)
Creatinine, Ser: 0.81 mg/dL (ref 0.44–1.00)
GFR calc Af Amer: >60 mL/min (ref >60)
GFR calc non Af Amer: >60 mL/min (ref >60)
Glucose, Bld: 75 mg/dL (ref 70–99)
Potassium: 3.6 mmol/L (ref 3.5–5.1)
Sodium: 133 mmol/L — ABNORMAL LOW (ref 135–145)
Total Bilirubin: 1 mg/dL (ref 0.3–1.2)
Total Protein: 5.9 g/dL — ABNORMAL LOW (ref 6.5–8.1)

## 2019-01-31 LAB — GLUCOSE, CAPILLARY
Glucose-Capillary: 57 mg/dL — ABNORMAL LOW (ref 70–99)
Glucose-Capillary: 107 mg/dL — ABNORMAL HIGH (ref 70–99)

## 2019-01-31 MED ORDER — DEXTROSE 50 % IV SOLN
INTRAVENOUS | Status: AC
  Administered 2019-01-31: 50 mL
  Filled 2019-01-31: qty 50

## 2019-01-31 MED ORDER — TRAMADOL HCL 50 MG PO TABS: 25.0000 mg | ORAL_TABLET | Freq: Two times a day (BID) | ORAL | 0 refills | Status: AC | PRN

## 2019-01-31 MED ORDER — PRO-STAT SUGAR FREE PO LIQD: 30.0000 mL | Freq: Two times a day (BID) | ORAL | 0 refills | Status: AC

## 2019-01-31 MED ORDER — MIRTAZAPINE 45 MG PO TBDP: 45.0000 mg | ORAL_TABLET | Freq: Every day | ORAL | 0 refills | Status: AC

## 2019-01-31 MED ORDER — LOPERAMIDE HCL 2 MG PO CAPS: 2.0000 mg | ORAL_CAPSULE | ORAL | 0 refills | Status: AC | PRN

## 2019-01-31 MED ORDER — MEGESTROL ACETATE 400 MG/10ML PO SUSP: 400.0000 mg | Freq: Three times a day (TID) | ORAL | 0 refills | Status: AC

## 2019-01-31 MED ORDER — GERHARDT'S BUTT CREAM: 1.0000 "application " | TOPICAL_CREAM | Freq: Three times a day (TID) | CUTANEOUS | 0 refills | Status: AC

## 2019-01-31 MED ORDER — NYSTATIN 100000 UNIT/ML MT SUSP: 5.0000 mL | Freq: Four times a day (QID) | OROMUCOSAL | 0 refills | Status: DC | PRN

## 2019-01-31 MED ORDER — NYSTATIN 100000 UNIT/GM EX POWD: Freq: Two times a day (BID) | CUTANEOUS | 0 refills | Status: AC

## 2019-01-31 MED ORDER — ADULT MULTIVITAMIN W/MINERALS CH: 1.0000 | ORAL_TABLET | Freq: Every day | ORAL | 0 refills | Status: AC

## 2019-01-31 NOTE — Progress Notes (Signed)
Physical Therapy Treatment Patient Details Name: Sydney [name] MRN: [id number] DOB: 1931-03-09 Today's Date: 01/31/2019    History of Present Illness Sydney [name] is a 85 y.o. 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 Pt pulled out NG tube 9/25    PT Comments    Patient received in bed, daughter present and assisted with mobility with session as well. Attempted PROM to B LEs however patient fully resisting all motion to B LEs, unable to convince her to relax and allow PROM or stretching. Then performed supine to sit with MaxA and extended time due to pain; tolerated sitting at EOB approximately 90 seconds before needing to be put back due to pain, required MaxAx2 for sit to supine and totalAx2 for repositioning in bed. She was left in bed with all needs met and daughter present this morning. Daughter reports that they are likely discharging soon, states that hospital bed, hoyer lift, and WC were delivered yesterday.     Follow Up Recommendations  SNF     Equipment Recommendations  Wheelchair (measurements PT);Wheelchair cushion (measurements PT);Hospital bed;Other (comment)(hoyer lift)    Recommendations for Other Services       Precautions / Restrictions Precautions Precautions: Fall Precaution Comments: had near syncopal episode on [facility] at home before coming in, extremely sore bottom Restrictions Weight Bearing Restrictions: No    Mobility  Bed Mobility Overal bed mobility: Needs Assistance Bed Mobility: Supine to Sit;Sit to Supine     Supine to sit: Max assist;HOB elevated Sit to supine: Max assist;+2 for physical assistance   General bed mobility comments: daughter present and assisted with bed  mobility; tolerated sitting at EOB for approximately 90 seconds with ModA for balance  Transfers                 General transfer comment: unable to complete due to pain  Ambulation/Gait             General Gait Details: unable due to pain   Stairs             Wheelchair Mobility    Modified Rankin (Stroke Patients Only)       Balance Overall balance assessment: Needs assistance Sitting-balance support: Single extremity supported Sitting balance-Leahy Scale: Poor Sitting balance - Comments: Min-ModA due to pain, L lateral lean and posterior lean Postural control: Left lateral lean;Posterior lean                                  Cognition Arousal/Alertness: Awake/alert Behavior During Therapy: Flat affect;Anxious Overall Cognitive Status: Impaired/Different from baseline Area of Impairment: Orientation;Attention;Memory;Following commands;Safety/judgement;Awareness;Problem solving                 Orientation Level: Disoriented to;Time;Situation Current Attention Level: Sustained Memory: Decreased recall of precautions;Decreased short-term memory Following Commands: Follows one step commands inconsistently Safety/Judgement: Decreased awareness of safety;Decreased awareness of deficits Awareness: Intellectual Problem Solving: Slow processing;Decreased initiation;Difficulty sequencing;Requires verbal cues;Requires tactile cues        Exercises      General Comments        Pertinent Vitals/Pain Pain Assessment: Faces Faces Pain Scale: Hurts whole lot Pain Location: buttock with movement Pain Descriptors / Indicators: Grimacing;Guarding;Moaning Pain Intervention(s): Limited activity  within patient's tolerance;Monitored during session;Repositioned    Home Living                      Prior Function            PT Goals (current goals can now be found in the care plan section) Acute Rehab PT Goals Patient Stated  Goal: daughter wants therapy maximized for recovery  PT Goal Formulation: With family Time For Goal Achievement: 02/02/19 Potential to Achieve Goals: Fair Additional Goals Additional Goal #1: Pt will mobilize utilizing a manual wheelchair for >30 feet with modA. Progress towards PT goals: Not progressing toward goals - comment(limited by pain)    Frequency    Min 2X/week      PT Plan Current plan remains appropriate    Co-evaluation              AM-PAC PT "6 Clicks" Mobility   Outcome Measure  Help needed turning from your back to your side while in a flat bed without using bedrails?: Total Help needed moving from lying on your back to sitting on the side of a flat bed without using bedrails?: Total Help needed moving to and from a bed to a chair (including a wheelchair)?: Total Help needed standing up from a chair using your arms (e.g., wheelchair or bedside chair)?: Total Help needed to walk in hospital room?: Total Help needed climbing 3-5 steps with a railing? : Total 6 Click Score: 6    End of Session Equipment Utilized During Treatment: Other (comment)(none) Activity Tolerance: Patient limited by pain Patient left: in bed;with call bell/phone within reach;with family/visitor present   PT Visit Diagnosis: Muscle weakness (generalized) (M62.81);Unsteadiness on feet (R26.81);Adult, failure to thrive (R62.7);Difficulty in walking, not elsewhere classified (R26.2)     Time: 3567-0141 PT Time Calculation (min) (ACUTE ONLY): 18 min  Charges:  $Therapeutic Activity: 8-22 mins                     Deniece Ree PT, DPT, CBIS  Supplemental Physical Therapist [place]    Pager [phone] Acute Rehab Office [phone]

## 2019-01-31 NOTE — Progress Notes (Signed)
Pt waiting for ptar to pick up for discharge home this pm. Sydney [name] over discharge teaching with daughter with no concerns voiced

## 2019-01-31 NOTE — Discharge Summary (Addendum)
PATIENT DETAILS Name: Sydney [name] Age: 85 y.o. Sex: female Date of Birth: 1930/06/27 MRN: [id number]. Admitting Physician: Vianne Bulls, MD PCP:No primary care provider on file.  Admit Date: 12/19/2018 Discharge date: 01/31/2019  Recommendations for Outpatient Follow-up:  1. Follow up with PCP in 1-2 weeks 2. Please obtain BMP/CBC in one week 3. Please ensure outpatient follow-up with neurology 4. Suspect at some point will require hospice involvement-especially if patient continues to experience failure to thrive syndrome   Admitted From:  Home   Disposition: Home with home health services (daughter refused SNF over hospice care at home)   Home Health: Yes  Equipment/Devices: None  Discharge Condition: Guarded-poor prognosis  CODE STATUS: FULL CODE  Diet recommendation:  Diet Order            Diet - low sodium heart healthy        DIET DYS 3 Room service appropriate? Yes; Fluid consistency: Thin  Diet effective now               Brief Summary: See H&P, Labs, Consult and Test reports for all details in brief,Patient is a 85 y.o. 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  During this hospital stay-she developed encephalopathy, worsening liver enzymes.  Despite a extensive/exhaustive work-up-no etiology evident for failure to thrive syndrome nor definite etiology found for elevated liver enzymes.  Was seen by numerous subspecialists including ID, GI, oncology, neurology.  With supportive care-she is somewhat improved-with resolution of encephalopathy-but continued to have significant failure to thrive syndrome and loss of appetite.  Palliative care/hospice involvement was considered-but patient's daughter was resistant to these efforts.  Even though her encephalopathy improved-she  continued to have very poor appetite-unfortunately-it is not felt that patient has met maximal benefit from this hospital stay-she has been requesting to go home for the past 6 days-plan is to see if change in environment-being at home will improve her spirits and appetite-as apart from supportive care-we have not been doing any active treatment over the past few days.  See below for further details.  Brief Hospital Course: Progressive generalized weakness associated with failure to thrive syndrome/weight loss:Etiology of progressive weakness/weight loss/failure to thrive syndrome remains unknown however TSH/a.m. cortisol/blood cultures all unremarkable.  CT of the abdomen without any obvious malignancy.  MRI brain without any obvious etiology.  Endoscopic evaluation was contemplated but given patient's tenuous clinical state-and suspicion of low yield for diagnoses-not pursued.  Not sure if a transient hepatitis-in a cholestatic pattern (?  Drug-induced hepatitis) could have caused this much of weakness.  After neurology evaluation-there was concern for spinal cord injury-hence a C-spine and T-spine MRI was done-this was negative for any acute abnormalities.  Due to concern for possible underlying myositis-a MRI of the left thigh with contrast was attempted-however patient was not able to lie still due to pain in the sacral area-and a limited MRI without contrast was performed.  MRI of the left thigh-was a nonspecific-showed diffuse soft tissue and muscle edema-which potentially could be from severe hypoalbuminemia.  Subsequently a muscle biopsy was contemplated-but after discussion with general surgery-patient and daughter elected not to pursue muscle biopsy given the inherent risks of anesthesia in this frail deconditioned patient.  Subsequently a discussion was held whether or not we should just empirically start steroids for treatment of possible underlying myositis-risks/benefits of this approach  was held  with patient's daughter by myself and neurology MD.  After extensive discussion-it was decided to hold off on starting empiric steroids.  Patient was also seen by psychiatry during this hospital stay-was started on Remeron.  She was also started on Megace to see if her appetite would improve.  As noted in my prior notes-patient still remains with very poor appetite (in spite of being on Megace and Remeron)-appears incredibly frail, debilitated and deconditioned.  However in terms of her mentation-she has improved-and is fairly awake and alert.  During the past 1 week that this MD has taken care of this patient-she is requesting that she go home.  Main issue at this point is that her appetite is still very poor-daughter has refused to reinsert NG tube (was placed during the early part of this hospital stay-but patient pulled out) or even place a PEG tube.  Unfortunately-since we do not know the etiology of the underlying disease process-apart from supportive care really no other active treatment can be offered at this point.  Patient's daughter refused SNF placement-she has also refused to entertain patient going home with hospice care (explained to me this morning that she does not think patient has a terminal diagnosis-and she is still focused on restoring her health).    At this point-after a very prolonged/extended hospital stay-exhaustive work-up-it is now felt that the patient has met maximal benefit from this hospital stay-since patient has been repeatedly asking for discharge-it is worth trying to see if discharging her to home environment will elevate her spirits and "perk" her up.  I have spoken extensively with the patient's daughter over the past few days-explained that in this regard scenario-I do not see her improving even if she stays in the hospital for another few days.  Patient's daughter is willing to try to take her home and see if patient improves-she will provide 24/7 care with  assistance from home health care services.  Even this morning, I did suggest that involving hospice at home-however patient's daughter told me that she and patient are both focusing on restoration of her health.  I did inform her that if patient continued to deteriorate-she had the option of involving hospice at this point.  Obviously patient's prognosis is felt to be very poor.  Please note-ethics committee was involved during this hospital stay.  This MD sought guidance from  Dr. Andria Frames from ethics committee over the phone this morning-regarding above issues/disposition plan-Dr. Andria Frames agreed and was supportive with the above outlined approach-since we really have no further work-up/treatment to offer while hospitalized.  Acute metabolic encephalopathy: Etiology uncertain-see above-however she has improved this morning-is hard of hearing but able to answer most of my questions appropriately.  Transaminitis: Mostly in a cholestatic pattern-viral hepatitis serology including CMV/EBV are negative.  Autoimmune hepatitis panel negative.  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  Thankfully LFTs have almost normalized-as noted above-not sure if a drug-induced hepatitis (?which drug) process can cause this much of encephalopathy (ammonia within normal limits) and severe failure to thrive syndrome.  CVA: Has generalized weakness but nonfocal-continue aspirin-if clinical improvement continues and no further procedures are contemplated-suspect we could resume Plavix.  Hypokalemia/hypomagnesemia: Repleted-continue to check periodically in the outpatient setting.  Odynophagia: Improving-treated with nystatin.  Thrombocytopenia: Resolved-suspect was secondary to transient hepatitis.  Anemia: Likely secondary to acute illness-follow periodically.  HTN: Blood pressure controlled without the use of any antihypertensives.  History of C. difficile colitis s/p stool  transplantation:  C. difficile PCR negative on 9/8-patient continues to have intermittent loose stools-spoke with infectious disease on the day of discharge-recommendations are to continue with as needed Imodium.  Since patient does not have fever-no abdominal pain-there is no suggestion of recurrent C. difficile colitis-no further work-up is recommended.  Sacral decubitus ulcer stage 4: Continue local wound care-appreciate wound care RN input.  She continues to have discomfort at the sacral site-tramadol has been ordered as needed-but daughter is not keen on giving the patient tramadol due to sedation concerns.    Deconditioning/debility: Secondary to prolonged hospitalization/acute illness-and underlying failure to thrive syndrome.  PT evaluated the patient-recommendations were SNF-however patient's daughter refused-and is taking her home with maximal home health services.  Nutrition Problem: Nutrition Problem: Severe Malnutrition Etiology: chronic illness(TIA) Signs/Symptoms: energy intake < or equal to 75% for > or equal to 1 month, moderate fat depletion, severe fat depletion, moderate muscle depletion, severe muscle depletion Interventions: Tube feeding (now discontinued)  Procedures/Studies: None  Discharge Diagnoses:  Principal Problem:   Adult failure to thrive Active Problems:   Pancytopenia ([place])   Hyponatremia   Protein calorie malnutrition ([facility])   History of TIA (transient ischemic attack)   Generalized weakness   Pressure injury of skin   Transaminitis   Malnutrition of moderate degree   Evaluation by psychiatric service required   MDD (major depressive disorder)   Abdominal pain   B12 deficiency   Loose stools   Discharge Instructions:  Activity:  As tolerated with Full fall precautions use walker/cane & assistance as needed  Discharge Instructions    Ambulatory referral to Neurology   Complete by: As directed    An appointment is requested in  approximately: 4 weeks   Call MD for:  extreme fatigue   Complete by: As directed    Call MD for:  persistant dizziness or light-headedness   Complete by: As directed    Call MD for:  persistant nausea and vomiting   Complete by: As directed    Call MD for:  redness, tenderness, or signs of infection (pain, swelling, redness, odor or green/yellow discharge around incision site)   Complete by: As directed    Call MD for:  severe uncontrolled pain   Complete by: As directed    Call MD for:  temperature >100.4   Complete by: As directed    Diet - low sodium heart healthy   Complete by: As directed    Dysphagia 3 diet   Discharge instructions   Complete by: As directed    Follow with Primary MD in 1-2 weeks  Follow-up with neurology if patient improves further-the office should be calling you soon with a follow-up appointment.  Please get a complete blood count and chemistry panel checked by your Primary MD at your next visit, and again as instructed by your Primary MD.  Get Medicines reviewed and adjusted: Please take all your medications with you for your next visit with your Primary MD  Laboratory/radiological data: Please request your Primary MD to go over all hospital tests and procedure/radiological results at the follow up, please ask your Primary MD to get all Hospital records sent to his/her office.  In some cases, they will be blood work, cultures and biopsy results pending at the time of your discharge. Please request that your primary care M.D. follows up on these results.  Also Note the following: If you experience worsening of your admission symptoms, develop shortness of breath, life threatening emergency, suicidal or homicidal thoughts  you must seek medical attention immediately by calling 911 or calling your MD immediately  if symptoms less severe.  You must read complete instructions/literature along with all the possible adverse reactions/side effects for all the  Medicines you take and that have been prescribed to you. Take any new Medicines after you have completely understood and accpet all the possible adverse reactions/side effects.   Do not drive when taking Pain medications or sleeping medications (Benzodaizepines)  Do not take more than prescribed Pain, Sleep and Anxiety Medications. It is not advisable to combine anxiety,sleep and pain medications without talking with your primary care practitioner  Special Instructions: If you have smoked or chewed Tobacco  in the last 2 yrs please stop smoking, stop any regular Alcohol  and or any Recreational drug use.  Wear Seat belts while driving.  Please note: You were cared for by a hospitalist during your hospital stay. Once you are discharged, your primary care physician will handle any further medical issues. Please note that NO REFILLS for any discharge medications will be authorized once you are discharged, as it is imperative that you return to your primary care physician (or establish a relationship with a primary care physician if you do not have one) for your post hospital discharge needs so that they can reassess your need for medications and monitor your lab values.   Discharge wound care:   Complete by: As directed    Place a small piece of Xeroform gauze into the coccyx wound bed. Cover with a foam dressing. Change daily.   Increase activity slowly   Complete by: As directed      Allergies as of 01/31/2019      Reactions   Demerol [meperidine] Other (See Comments)   Excessive sweating, cramping   Other    No Antibiotic without consultation with her primary physician.   Codeine Rash   Sulfa Antibiotics Itching, Other (See Comments)   Reaction unknown   Tomato Itching      Medication List    STOP taking these medications   cephALEXin 500 MG capsule Commonly known as: KEFLEX   loratadine 10 MG tablet Commonly known as: CLARITIN     TAKE these medications   cholecalciferol  1000 units tablet Commonly known as: VITAMIN D Take 1,000 Units by mouth daily.   clopidogrel 75 MG tablet Commonly known as: PLAVIX Take 75 mg by mouth daily.   Ecotrin Low Strength 81 MG EC tablet Generic drug: aspirin Take 81 mg by mouth daily. Swallow whole.   feeding supplement (PRO-STAT SUGAR FREE 64) Liqd Take 30 mLs by mouth 2 (two) times daily with a meal.   folic acid 1 MG tablet Commonly known as: FOLVITE Take 1 mg by mouth daily.   Gerhardt's butt cream Crea Apply 1 application topically 3 (three) times daily. Apply barrier cream to buttocks/sacrum TID and PRN with each incontinent cleaning session   latanoprost 0.005 % ophthalmic solution Commonly known as: XALATAN Place 1 drop into both eyes at bedtime.   loperamide 2 MG capsule Commonly known as: IMODIUM Take 1 capsule (2 mg total) by mouth as needed for diarrhea or loose stools.   megestrol 400 MG/10ML suspension Commonly known as: MEGACE Take 10 mLs (400 mg total) by mouth 3 (three) times daily.   mirtazapine 45 MG disintegrating tablet Commonly known as: REMERON SOL-TAB Take 1 tablet (45 mg total) by mouth at bedtime.   multivitamin with minerals Tabs tablet Take 1 tablet by mouth daily. Start  taking on: February 01, 2019   nystatin 100000 UNIT/ML suspension Commonly known as: MYCOSTATIN Take 5 mLs (500,000 Units total) by mouth 4 (four) times daily as needed for up to 10 days (thrush).   nystatin powder Commonly known as: MYCOSTATIN/NYSTOP Apply topically 2 (two) times daily. Apply to affected area on the skin folds   pantoprazole 20 MG tablet Commonly known as: PROTONIX Take 20 mg by mouth daily.   traMADol 50 MG tablet Commonly known as: ULTRAM Take 0.5 tablets (25 mg total) by mouth every 12 (twelve) hours as needed for moderate pain.            Durable Medical Equipment  (From admission, onward)         Start     Ordered   01/30/19 1124  For home use only DME Hospital bed  Once     Comments: Fully electrical  Question Answer Comment  Length of Need Lifetime   Patient has (list medical condition): Progressive generalized weakness associated with failure to thrive syndrome/weight loss   The above medical condition requires: Patient requires the ability to reposition frequently   Head must be elevated greater than: 45 degrees   Bed type Semi-electric   Hoyer Lift Yes   Support Surface: Low Air loss Mattress      01/30/19 1124   01/25/19 1125  For home use only DME lightweight manual wheelchair with seat cushion  Once    Comments: Patient suffers from Generalized weakness due to severe protein calorie malnutrition and weight loss due to failure to thrive, which impairs their ability to perform daily activities like ambulating  in the home.  A cane will not resolve  issue with performing activities of daily living. A wheelchair will allow patient to safely perform daily activities. Patient is not able to propel themselves in the home using a standard weight wheelchair due to Generalized weakness due to severe protein calorie malnutrition and weight loss due to failure to thrive, . Patient can self propel in the lightweight wheelchair. Length of need lifetime Accessories: elevating leg rests (ELRs), wheel locks, extensions and anti-tippers.  Seat and back cushions   Call daughter Ivey Nembhard [phone] for delivery   01/25/19 1126   01/25/19 1124  For home use only DME Hospital bed  Once    Comments: Call daughter Anaston Koehn [phone] for delivery  Question Answer Comment  Length of Need Lifetime   Patient has (list medical condition): Generalized weakness due to severe protein calorie malnutrition and weight loss due to failure to thrive,   The above medical condition requires: Patient requires the ability to reposition frequently   Head must be elevated greater than: 45 degrees   Bed type Semi-electric   Hoyer Lift Yes   Support Surface: Alternating  Pressure Pad and Pump      01/25/19 1124           Discharge Care Instructions  (From admission, onward)         Start     Ordered   01/31/19 0000  Discharge wound care:     01/31/19 1417         Follow-up Information    Primary care MD. Schedule an appointment as soon as possible for a visit in 2 week(s).        [facility] Follow up.   Why: Office should be calling you for a follow-up appointment. Contact information: [address]  [address] [place] [address] [phone]       Gatha Mayer, MD. Schedule an appointment as soon as possible for a visit.   Specialty: Gastroenterology Why: as needed Contact information: [address] [address] [phone]          Allergies  Allergen Reactions   Demerol [Meperidine] Other (See Comments)    Excessive sweating, cramping   Other     No Antibiotic without consultation with her primary physician.   Codeine Rash   Sulfa Antibiotics Itching and Other (See Comments)    Reaction unknown   Tomato Itching    Consultations:  GI, ID, neurology, oncology, palliative care, ethics committee, general surgery   Other Procedures/Studies: Dg Abd 1 View  Result Date: 01/17/2019 CLINICAL DATA:  85 year old female with a history of enteric tube placement EXAM: ABDOMEN - 1 VIEW; DG NASO G TUBE PLC W/FL-NO RAD COMPARISON:  January 13, 2019 FINDINGS: Limited plain film demonstrates post pyloric tube terminating in the 3/4 portion of the duodenum. IMPRESSION: Post pyloric feeding tube terminates in the 3/4 portion the duodenum. Electronically Signed   By: Corrie Mckusick D.O.   On: 01/17/2019 15:55   Dg Abd 1 View  Result Date: 01/13/2019 CLINICAL DATA:  Blocked feeding tube. EXAM: ABDOMEN - 1 VIEW COMPARISON:  12/29/2018 FINDINGS: Enteric fusion tube is present with tip over the region of the distal stomach just right of midline in the mid abdomen. Bowel  gas pattern is nonobstructive. Remainder of the exam is unchanged. IMPRESSION: Nonobstructive bowel gas pattern. Enteric feeding tube with tip right of midline in the mid abdomen likely over the distal stomach. Electronically Signed   By: Marin Olp M.D.   On: 01/13/2019 12:28   Mr Cervical Spine Wo Contrast  Result Date: 01/29/2019 CLINICAL DATA:  85 year old female with ataxia. Unexplained lower extremity weakness, spasticity. EXAM: MRI CERVICAL SPINE WITHOUT CONTRAST TECHNIQUE: Multiplanar, multisequence MR imaging of the cervical spine was performed. No intravenous contrast was administered. COMPARISON:  Brain MRI 12/21/2018.  Neck CT 10/13/2017. FINDINGS: Alignment: Preserved cervical lordosis similar to the 2019 neck CT. No spondylolisthesis. Vertebrae: No marrow edema or evidence of acute osseous abnormality. Visualized bone marrow signal is within normal limits. Cord: Suboptimal spinal cord detail due to motion artifact despite repeated imaging attempts, but no convincing cervical or upper thoracic spinal cord signal abnormality. Fairly capacious spinal canal. Posterior Fossa, vertebral arteries, paraspinal tissues: Stable cervicomedullary junction and visible posterior fossa. Preserved major vascular flow voids in the neck, the left vertebral artery appears mildly dominant. Disc levels: Mild for age cervical spine degeneration with no cervical or upper thoracic spinal stenosis. There is i generally mild cervical neural foraminal stenosis despite some levels of facet hypertrophy. There is up to moderate stenosis at the left C4 nerve level. IMPRESSION: Essentially negative for age MRI appearance of the cervical spine. No spinal stenosis, and no cord signal abnormality identified although cord detail is suboptimal due to motion. Electronically Signed   By: Genevie Ann M.D.   On: 01/29/2019 15:12   Mr Thoracic Spine Wo Contrast  Result Date: 01/29/2019 CLINICAL DATA:  84 year old female with ataxia.  Unexplained lower extremity weakness, spasticity. EXAM: MRI THORACIC SPINE WITHOUT CONTRAST TECHNIQUE: Multiplanar, multisequence MR imaging of the thoracic spine was performed. No intravenous contrast was administered. COMPARISON:  Cervical spine MRI today reported separately. FINDINGS: The examination had to be discontinued prior to completion due to patient pain. Only 1 set of axial images  was obtained, and the study is intermittently degraded by motion. Limited cervical spine imaging:  Reported separately today. Thoracic spine segmentation:  Appears to be normal. Alignment: Mildly exaggerated thoracic kyphosis. No spondylolisthesis. Vertebrae: No marrow edema or evidence of acute osseous abnormality. Visualized bone marrow signal is within normal limits. Cord: No definite thoracic spinal cord signal abnormality is identified. The conus appears normal at T11-T12. There is questionable volume loss of the thoracic cord, somewhat generalized (e.g. So at the T2-T3 level series 22, image 8, and again at the T8-T9 level series 22, image 25). As in the cervical spine, the spinal canal is capacious. Paraspinal and other soft tissues: Small to moderate layering left left greater than right pleural effusions. Posterior paraspinal soft tissues appear within normal limits. Disc levels: Mild for age thoracic spine degeneration similar to that in the cervical spine. No spinal or foraminal stenosis. IMPRESSION: 1. Questionable generalized thoracic spinal cord volume loss, but no definite focal cord signal abnormality. Cord detail is suboptimal due to motion. 2. Mild for age thoracic spine degeneration and capacious spinal canal. 3. Left greater than right layering pleural effusions. Electronically Signed   By: Genevie Ann M.D.   On: 01/29/2019 15:25   Ct Abdomen Pelvis W Contrast  Result Date: 01/06/2019 CLINICAL DATA:  Abdominal pain EXAM: CT ABDOMEN AND PELVIS WITH CONTRAST TECHNIQUE: Multidetector CT imaging of the abdomen  and pelvis was performed using the standard protocol following bolus administration of intravenous contrast. CONTRAST:  149m OMNIPAQUE IOHEXOL 300 MG/ML  SOLN COMPARISON:  CT December 20, 2018 FINDINGS: Lower chest: Moderate bilateral effusions with adjacent areas of passive atelectasis. Mild airways thickening is present. Normal heart size. No pericardial effusion. Hepatobiliary: Stable 11 mm hepatic cyst. No concerning liver abnormality is seen. No calcified gallstones, gallbladder wall thickening or biliary ductal dilatation. Pancreas: Unremarkable. No pancreatic ductal dilatation or surrounding inflammatory changes. Spleen: Diminutive spleen. Otherwise unremarkable. Adrenals/Urinary Tract: Adrenal glands are unremarkable. Kidneys are normal, without renal calculi, focal lesion, or hydronephrosis. Urinary bladder is distended but otherwise unremarkable. Stomach/Bowel: Transesophageal tube tip terminates at the level of the gastric antrum. There is edematous mural thickening of much of the small bowel and more focally pronounced in the cecum and ascending colon. Distal colon assumes a less thickened appearance. The appendix is not well visualized. Vascular/Lymphatic: Atherosclerotic plaque within the normal caliber aorta. Portal and hepatic veins are patent. Venous engorgement of the gonadal veins. No suspicious or enlarged lymph nodes in the included lymphatic chains. Reproductive: Parametrial calcifications and engorged gonadal veins, as above. Few scattered calcified pelvic fibroids within a retroverted uterus. No discernible adnexal lesions. Other: Moderate volume of free intraperitoneal ascites with diffusely increased attenuation of the mesenteric leaflets and fluid tracking and bilateral inguinal hernias. Extensive circumferential body wall edema is present as well. As well as milder edematous changes in the soft tissues of the imaged extremities. No free intraperitoneal air Musculoskeletal: Degenerative  changes present in both wrists. Multilevel degenerative changes are present in the imaged portions of the spine. Disc bulges present at L3-4 and L4-5. Vacuum disc noted at L3-4 as well. IMPRESSION: 1. Moderate volume of free intraperitoneal ascites, bilateral effusions, circumferential body wall and extremity edema compatible with anasarca. 2. Edematous mural thickening of much of the small bowel, more focally pronounced in the cecum and ascending colon, possibly reflective of the patient's fluid status though cannot exclude underlying enterocolitis. Correlate with symptoms. 3. Venous engorgement of the gonadal veins, which can be seen with pelvic congestion  syndrome. 4. Retroverted fibroid uterus. 5. Bilateral fluid-filled inguinal hernias 6. Aortic Atherosclerosis (ICD10-I70.0). Electronically Signed   By: Lovena Le M.D.   On: 01/06/2019 21:13   Mr Femur Left Wo Contrast  Result Date: 01/30/2019 CLINICAL DATA:  Myelopathy, acute or progressive. Evaluate for myositis. EXAM: MR OF THE LEFT FEMUR WITHOUT CONTRAST TECHNIQUE: Coronal T1 and inversion recovery imaging of both thighs was performed. The patient refused additional imaging. No intravenous contrast was administered. COMPARISON:  Pelvic CT 01/06/2019. FINDINGS: Bones/Joint/Cartilage There is no evidence of acute fracture, dislocation or femoral head avascular necrosis. There are mild degenerative changes of both hips, similar to previous CT. Mild degenerative changes are also present in both knees, greater on the right. No large joint effusions. Ligaments Not relevant for examination/indication. Muscles and Tendons There is generalized soft tissue edema with mild T2 hyperintensity throughout the subcutaneous fat and muscles of both thighs. No focal fluid collection or muscular atrophy identified. No gross tendon abnormalities on coronal imaging. Soft tissues As above, generalized soft tissue edema throughout both thighs. No focal fluid collections are  identified. IMPRESSION: 1. Limited study, consisting of only coronal imaging of both thighs. 2. Nonspecific generalized soft tissue edema involving the muscles and subcutaneous fat of both thighs. Cannot exclude myositis. No focal fluid collection or muscular atrophy identified. 3. No acute osseous findings. Mild degenerative changes of both hips and knees. Electronically Signed   By: Richardean Sale M.D.   On: 01/30/2019 14:24   Dg Chest Port 1 View  Result Date: 01/23/2019 CLINICAL DATA:  Cough. EXAM: PORTABLE CHEST 1 VIEW COMPARISON:  Radiograph 01/13/2019 FINDINGS: Enteric tube has been removed. Lower lung volumes from prior. Patient is rotated. Cardiomegaly is more prominent than on prior exam. Unchanged mediastinal contours with aortic atherosclerosis. Moderate bilateral pleural effusions and associated bibasilar opacities, left greater than right. No evidence of pulmonary edema. No confluent airspace disease. No pneumothorax. IMPRESSION: 1. Moderate bilateral pleural effusions with associated bibasilar opacities, left greater than right, likely atelectasis. 2. Cardiomegaly is more prominent than on prior exam. Aortic Atherosclerosis (ICD10-I70.0). Electronically Signed   By: Keith Rake M.D.   On: 01/23/2019 19:30   Dg Chest Port 1 View  Result Date: 01/13/2019 CLINICAL DATA:  Patient is spitting up 2 feeds. Possible aspiration. EXAM: PORTABLE CHEST 1 VIEW COMPARISON:  Single-view of the chest 12/16/2018. FINDINGS: Feeding tube courses into the stomach and below the inferior margin of the film. The patient has moderate bilateral pleural effusions. Mild basilar atelectasis noted. There is cardiomegaly without edema. No pneumothorax. No acute or focal bony abnormality. IMPRESSION: Moderate layering pleural effusions with mild bibasilar atelectasis. Cardiomegaly without edema. Electronically Signed   By: Inge Rise M.D.   On: 01/13/2019 09:47   Dg Addison Bailey G Tube Plc W/fl-no Rad  Result Date:  01/17/2019 CLINICAL DATA:  85 year old female with a history of enteric tube placement EXAM: ABDOMEN - 1 VIEW; DG NASO G TUBE PLC W/FL-NO RAD COMPARISON:  January 13, 2019 FINDINGS: Limited plain film demonstrates post pyloric tube terminating in the 3/4 portion of the duodenum. IMPRESSION: Post pyloric feeding tube terminates in the 3/4 portion the duodenum. Electronically Signed   By: Corrie Mckusick D.O.   On: 01/17/2019 15:55   US Liver Doppler  Result Date: 01/13/2019 CLINICAL DATA:  85 year old female with elevated liver enzymes. Evaluate for portal vein stenosis/occlusion. EXAM: DUPLEX ULTRASOUND OF LIVER TECHNIQUE: Color and duplex Doppler ultrasound was performed to evaluate the hepatic in-flow and out-flow vessels. COMPARISON:  Prior CT abdomen/pelvis 01/06/2019 FINDINGS: Liver: Normal parenchymal echogenicity. Normal hepatic contour without nodularity. No focal lesion, mass or intrahepatic biliary ductal dilatation. Portal Vein Velocities Main:  35 cm/sec with normal hepatopetal flow. Right:  23 cm/sec with normal hepatopetal flow. Left:  27 cm/sec with normal hepatopetal flow. Hepatic Vein Velocities Right:  16 cm/sec Middle:  27 cm/sec Left:  23 cm/sec IVC: Present and patent with normal respiratory phasicity. Hepatic Artery Velocity:  102 cm/sec Splenic Vein Velocity:  31 cm/sec Varices: Absent Ascites: Small volume ascites. Other: Bilateral pleural effusions are noted incidentally. IMPRESSION: 1. Patent and normal appearing portal and hepatic veins. 2. Small volume ascites. 3. Bilateral moderate pleural effusions. 4. Normal sonographic appearance of the liver. Electronically Signed   By: Jacqulynn Cadet M.D.   On: 01/13/2019 16:00     TODAY-DAY OF DISCHARGE:  Subjective:   Sydney [name] today remains essentially unchanged for the past several days.  She has been requesting to go home  Objective:   Blood pressure (!) 147/85, pulse 100, temperature 97.7 F (36.5 C), temperature source  Oral, resp. rate 20, height '5\' 3"'  (1.6 m), weight 60.4 kg, SpO2 99 %. No intake or output data in the 24 hours ending 01/31/19 1521 Filed Weights   01/21/19 0500 01/24/19 0500 01/25/19 0500  Weight: 60 kg 60.4 kg 60.4 kg    Exam: Awake Alert, No new F.N deficits, Normal affect-but appears incredibly frail and debilitated [place].AT,PERRAL Supple Neck,No JVD, No cervical lymphadenopathy appriciated.  Symmetrical Chest wall movement, Good air movement bilaterally, CTAB RRR,No Gallops,Rubs or new Murmurs, No Parasternal Heave +ve B.Sounds, Abd Soft, Non tender, No organomegaly appriciated, No rebound -guarding or rigidity. No Cyanosis, Clubbing or edema, No new Rash or bruise   PERTINENT RADIOLOGIC STUDIES: Dg Abd 1 View  Result Date: 01/17/2019 CLINICAL DATA:  85 year old female with a history of enteric tube placement EXAM: ABDOMEN - 1 VIEW; DG NASO G TUBE PLC W/FL-NO RAD COMPARISON:  January 13, 2019 FINDINGS: Limited plain film demonstrates post pyloric tube terminating in the 3/4 portion of the duodenum. IMPRESSION: Post pyloric feeding tube terminates in the 3/4 portion the duodenum. Electronically Signed   By: Corrie Mckusick D.O.   On: 01/17/2019 15:55   Dg Abd 1 View  Result Date: 01/13/2019 CLINICAL DATA:  Blocked feeding tube. EXAM: ABDOMEN - 1 VIEW COMPARISON:  12/29/2018 FINDINGS: Enteric fusion tube is present with tip over the region of the distal stomach just right of midline in the mid abdomen. Bowel gas pattern is nonobstructive. Remainder of the exam is unchanged. IMPRESSION: Nonobstructive bowel gas pattern. Enteric feeding tube with tip right of midline in the mid abdomen likely over the distal stomach. Electronically Signed   By: Marin Olp M.D.   On: 01/13/2019 12:28   Mr Cervical Spine Wo Contrast  Result Date: 01/29/2019 CLINICAL DATA:  85 year old female with ataxia. Unexplained lower extremity weakness, spasticity. EXAM: MRI CERVICAL SPINE WITHOUT CONTRAST TECHNIQUE:  Multiplanar, multisequence MR imaging of the cervical spine was performed. No intravenous contrast was administered. COMPARISON:  Brain MRI 12/21/2018.  Neck CT 10/13/2017. FINDINGS: Alignment: Preserved cervical lordosis similar to the 2019 neck CT. No spondylolisthesis. Vertebrae: No marrow edema or evidence of acute osseous abnormality. Visualized bone marrow signal is within normal limits. Cord: Suboptimal spinal cord detail due to motion artifact despite repeated imaging attempts, but no convincing cervical or upper thoracic spinal cord signal abnormality. Fairly capacious spinal canal. Posterior Fossa, vertebral arteries, paraspinal tissues: Stable cervicomedullary junction and  visible posterior fossa. Preserved major vascular flow voids in the neck, the left vertebral artery appears mildly dominant. Disc levels: Mild for age cervical spine degeneration with no cervical or upper thoracic spinal stenosis. There is i generally mild cervical neural foraminal stenosis despite some levels of facet hypertrophy. There is up to moderate stenosis at the left C4 nerve level. IMPRESSION: Essentially negative for age MRI appearance of the cervical spine. No spinal stenosis, and no cord signal abnormality identified although cord detail is suboptimal due to motion. Electronically Signed   By: Genevie Ann M.D.   On: 01/29/2019 15:12   Mr Thoracic Spine Wo Contrast  Result Date: 01/29/2019 CLINICAL DATA:  85 year old female with ataxia. Unexplained lower extremity weakness, spasticity. EXAM: MRI THORACIC SPINE WITHOUT CONTRAST TECHNIQUE: Multiplanar, multisequence MR imaging of the thoracic spine was performed. No intravenous contrast was administered. COMPARISON:  Cervical spine MRI today reported separately. FINDINGS: The examination had to be discontinued prior to completion due to patient pain. Only 1 set of axial images was obtained, and the study is intermittently degraded by motion. Limited cervical spine imaging:   Reported separately today. Thoracic spine segmentation:  Appears to be normal. Alignment: Mildly exaggerated thoracic kyphosis. No spondylolisthesis. Vertebrae: No marrow edema or evidence of acute osseous abnormality. Visualized bone marrow signal is within normal limits. Cord: No definite thoracic spinal cord signal abnormality is identified. The conus appears normal at T11-T12. There is questionable volume loss of the thoracic cord, somewhat generalized (e.g. So at the T2-T3 level series 22, image 8, and again at the T8-T9 level series 22, image 25). As in the cervical spine, the spinal canal is capacious. Paraspinal and other soft tissues: Small to moderate layering left left greater than right pleural effusions. Posterior paraspinal soft tissues appear within normal limits. Disc levels: Mild for age thoracic spine degeneration similar to that in the cervical spine. No spinal or foraminal stenosis. IMPRESSION: 1. Questionable generalized thoracic spinal cord volume loss, but no definite focal cord signal abnormality. Cord detail is suboptimal due to motion. 2. Mild for age thoracic spine degeneration and capacious spinal canal. 3. Left greater than right layering pleural effusions. Electronically Signed   By: Genevie Ann M.D.   On: 01/29/2019 15:25   Ct Abdomen Pelvis W Contrast  Result Date: 01/06/2019 CLINICAL DATA:  Abdominal pain EXAM: CT ABDOMEN AND PELVIS WITH CONTRAST TECHNIQUE: Multidetector CT imaging of the abdomen and pelvis was performed using the standard protocol following bolus administration of intravenous contrast. CONTRAST:  123m OMNIPAQUE IOHEXOL 300 MG/ML  SOLN COMPARISON:  CT December 20, 2018 FINDINGS: Lower chest: Moderate bilateral effusions with adjacent areas of passive atelectasis. Mild airways thickening is present. Normal heart size. No pericardial effusion. Hepatobiliary: Stable 11 mm hepatic cyst. No concerning liver abnormality is seen. No calcified gallstones, gallbladder wall  thickening or biliary ductal dilatation. Pancreas: Unremarkable. No pancreatic ductal dilatation or surrounding inflammatory changes. Spleen: Diminutive spleen. Otherwise unremarkable. Adrenals/Urinary Tract: Adrenal glands are unremarkable. Kidneys are normal, without renal calculi, focal lesion, or hydronephrosis. Urinary bladder is distended but otherwise unremarkable. Stomach/Bowel: Transesophageal tube tip terminates at the level of the gastric antrum. There is edematous mural thickening of much of the small bowel and more focally pronounced in the cecum and ascending colon. Distal colon assumes a less thickened appearance. The appendix is not well visualized. Vascular/Lymphatic: Atherosclerotic plaque within the normal caliber aorta. Portal and hepatic veins are patent. Venous engorgement of the gonadal veins. No suspicious or enlarged lymph nodes  in the included lymphatic chains. Reproductive: Parametrial calcifications and engorged gonadal veins, as above. Few scattered calcified pelvic fibroids within a retroverted uterus. No discernible adnexal lesions. Other: Moderate volume of free intraperitoneal ascites with diffusely increased attenuation of the mesenteric leaflets and fluid tracking and bilateral inguinal hernias. Extensive circumferential body wall edema is present as well. As well as milder edematous changes in the soft tissues of the imaged extremities. No free intraperitoneal air Musculoskeletal: Degenerative changes present in both wrists. Multilevel degenerative changes are present in the imaged portions of the spine. Disc bulges present at L3-4 and L4-5. Vacuum disc noted at L3-4 as well. IMPRESSION: 1. Moderate volume of free intraperitoneal ascites, bilateral effusions, circumferential body wall and extremity edema compatible with anasarca. 2. Edematous mural thickening of much of the small bowel, more focally pronounced in the cecum and ascending colon, possibly reflective of the patient's  fluid status though cannot exclude underlying enterocolitis. Correlate with symptoms. 3. Venous engorgement of the gonadal veins, which can be seen with pelvic congestion syndrome. 4. Retroverted fibroid uterus. 5. Bilateral fluid-filled inguinal hernias 6. Aortic Atherosclerosis (ICD10-I70.0). Electronically Signed   By: Lovena Le M.D.   On: 01/06/2019 21:13   Mr Femur Left Wo Contrast  Result Date: 01/30/2019 CLINICAL DATA:  Myelopathy, acute or progressive. Evaluate for myositis. EXAM: MR OF THE LEFT FEMUR WITHOUT CONTRAST TECHNIQUE: Coronal T1 and inversion recovery imaging of both thighs was performed. The patient refused additional imaging. No intravenous contrast was administered. COMPARISON:  Pelvic CT 01/06/2019. FINDINGS: Bones/Joint/Cartilage There is no evidence of acute fracture, dislocation or femoral head avascular necrosis. There are mild degenerative changes of both hips, similar to previous CT. Mild degenerative changes are also present in both knees, greater on the right. No large joint effusions. Ligaments Not relevant for examination/indication. Muscles and Tendons There is generalized soft tissue edema with mild T2 hyperintensity throughout the subcutaneous fat and muscles of both thighs. No focal fluid collection or muscular atrophy identified. No gross tendon abnormalities on coronal imaging. Soft tissues As above, generalized soft tissue edema throughout both thighs. No focal fluid collections are identified. IMPRESSION: 1. Limited study, consisting of only coronal imaging of both thighs. 2. Nonspecific generalized soft tissue edema involving the muscles and subcutaneous fat of both thighs. Cannot exclude myositis. No focal fluid collection or muscular atrophy identified. 3. No acute osseous findings. Mild degenerative changes of both hips and knees. Electronically Signed   By: Richardean Sale M.D.   On: 01/30/2019 14:24   Dg Chest Port 1 View  Result Date: 01/23/2019 CLINICAL  DATA:  Cough. EXAM: PORTABLE CHEST 1 VIEW COMPARISON:  Radiograph 01/13/2019 FINDINGS: Enteric tube has been removed. Lower lung volumes from prior. Patient is rotated. Cardiomegaly is more prominent than on prior exam. Unchanged mediastinal contours with aortic atherosclerosis. Moderate bilateral pleural effusions and associated bibasilar opacities, left greater than right. No evidence of pulmonary edema. No confluent airspace disease. No pneumothorax. IMPRESSION: 1. Moderate bilateral pleural effusions with associated bibasilar opacities, left greater than right, likely atelectasis. 2. Cardiomegaly is more prominent than on prior exam. Aortic Atherosclerosis (ICD10-I70.0). Electronically Signed   By: Keith Rake M.D.   On: 01/23/2019 19:30   Dg Chest Port 1 View  Result Date: 01/13/2019 CLINICAL DATA:  Patient is spitting up 2 feeds. Possible aspiration. EXAM: PORTABLE CHEST 1 VIEW COMPARISON:  Single-view of the chest 12/16/2018. FINDINGS: Feeding tube courses into the stomach and below the inferior margin of the film. The patient has moderate  bilateral pleural effusions. Mild basilar atelectasis noted. There is cardiomegaly without edema. No pneumothorax. No acute or focal bony abnormality. IMPRESSION: Moderate layering pleural effusions with mild bibasilar atelectasis. Cardiomegaly without edema. Electronically Signed   By: Inge Rise M.D.   On: 01/13/2019 09:47   Dg Addison Bailey G Tube Plc W/fl-no Rad  Result Date: 01/17/2019 CLINICAL DATA:  85 year old female with a history of enteric tube placement EXAM: ABDOMEN - 1 VIEW; DG NASO G TUBE PLC W/FL-NO RAD COMPARISON:  January 13, 2019 FINDINGS: Limited plain film demonstrates post pyloric tube terminating in the 3/4 portion of the duodenum. IMPRESSION: Post pyloric feeding tube terminates in the 3/4 portion the duodenum. Electronically Signed   By: Corrie Mckusick D.O.   On: 01/17/2019 15:55   US Liver Doppler  Result Date: 01/13/2019 CLINICAL  DATA:  85 year old female with elevated liver enzymes. Evaluate for portal vein stenosis/occlusion. EXAM: DUPLEX ULTRASOUND OF LIVER TECHNIQUE: Color and duplex Doppler ultrasound was performed to evaluate the hepatic in-flow and out-flow vessels. COMPARISON:  Prior CT abdomen/pelvis 01/06/2019 FINDINGS: Liver: Normal parenchymal echogenicity. Normal hepatic contour without nodularity. No focal lesion, mass or intrahepatic biliary ductal dilatation. Portal Vein Velocities Main:  35 cm/sec with normal hepatopetal flow. Right:  23 cm/sec with normal hepatopetal flow. Left:  27 cm/sec with normal hepatopetal flow. Hepatic Vein Velocities Right:  16 cm/sec Middle:  27 cm/sec Left:  23 cm/sec IVC: Present and patent with normal respiratory phasicity. Hepatic Artery Velocity:  102 cm/sec Splenic Vein Velocity:  31 cm/sec Varices: Absent Ascites: Small volume ascites. Other: Bilateral pleural effusions are noted incidentally. IMPRESSION: 1. Patent and normal appearing portal and hepatic veins. 2. Small volume ascites. 3. Bilateral moderate pleural effusions. 4. Normal sonographic appearance of the liver. Electronically Signed   By: Jacqulynn Cadet M.D.   On: 01/13/2019 16:00     PERTINENT LAB RESULTS: CBC: Recent Labs    01/29/19 1503  WBC 7.3  HGB 10.6*  HCT 33.3*  PLT 257   CMET CMP     Component Value Date/Time   NA 133 (L) 01/31/2019 0355   K 3.6 01/31/2019 0355   CL 108 01/31/2019 0355   CO2 15 (L) 01/31/2019 0355   GLUCOSE 75 01/31/2019 0355   BUN 14 01/31/2019 0355   CREATININE 0.81 01/31/2019 0355   CREATININE 0.67 01/03/2014 1111   CALCIUM 7.9 (L) 01/31/2019 0355   PROT 5.9 (L) 01/31/2019 0355   ALBUMIN 2.0 (L) 01/31/2019 0355   AST 39 01/31/2019 0355   ALT 27 01/31/2019 0355   ALKPHOS 161 (H) 01/31/2019 0355   BILITOT 1.0 01/31/2019 0355   GFRNONAA >60 01/31/2019 0355   GFRAA >60 01/31/2019 0355    GFR Estimated Creatinine Clearance: 41.2 mL/min (by C-G formula based on SCr  of 0.81 mg/dL). No results for input(s): LIPASE, AMYLASE in the last 72 hours. No results for input(s): CKTOTAL, CKMB, CKMBINDEX, TROPONINI in the last 72 hours. Invalid input(s): POCBNP No results for input(s): DDIMER in the last 72 hours. No results for input(s): HGBA1C in the last 72 hours. No results for input(s): CHOL, HDL, LDLCALC, TRIG, CHOLHDL, LDLDIRECT in the last 72 hours. No results for input(s): TSH, T4TOTAL, T3FREE, THYROIDAB in the last 72 hours.  Invalid input(s): FREET3 No results for input(s): VITAMINB12, FOLATE, FERRITIN, TIBC, IRON, RETICCTPCT in the last 72 hours. Coags: No results for input(s): INR in the last 72 hours.  Invalid input(s): PT Microbiology: No results found for this or any previous visit (from  the past 240 hour(s)).  FURTHER DISCHARGE INSTRUCTIONS:  Get Medicines reviewed and adjusted: Please take all your medications with you for your next visit with your Primary MD  Laboratory/radiological data: Please request your Primary MD to go over all hospital tests and procedure/radiological results at the follow up, please ask your Primary MD to get all Hospital records sent to his/her office.  In some cases, they will be blood work, cultures and biopsy results pending at the time of your discharge. Please request that your primary care M.D. goes through all the records of your hospital data and follows up on these results.  Also Note the following: If you experience worsening of your admission symptoms, develop shortness of breath, life threatening emergency, suicidal or homicidal thoughts you must seek medical attention immediately by calling 911 or calling your MD immediately  if symptoms less severe.  You must read complete instructions/literature along with all the possible adverse reactions/side effects for all the Medicines you take and that have been prescribed to you. Take any new Medicines after you have completely understood and accpet all the  possible adverse reactions/side effects.   Do not drive when taking Pain medications or sleeping medications (Benzodaizepines)  Do not take more than prescribed Pain, Sleep and Anxiety Medications. It is not advisable to combine anxiety,sleep and pain medications without talking with your primary care practitioner  Special Instructions: If you have smoked or chewed Tobacco  in the last 2 yrs please stop smoking, stop any regular Alcohol  and or any Recreational drug use.  Wear Seat belts while driving.  Please note: You were cared for by a hospitalist during your hospital stay. Once you are discharged, your primary care physician will handle any further medical issues. Please note that NO REFILLS for any discharge medications will be authorized once you are discharged, as it is imperative that you return to your primary care physician (or establish a relationship with a primary care physician if you do not have one) for your post hospital discharge needs so that they can reassess your need for medications and monitor your lab values.  Total Time spent coordinating discharge including counseling, education and face to face time equals 65 minutes.  Signed: Nickol Collister 01/31/2019 3:21 PM

## 2019-01-31 NOTE — TOC Progression Note (Addendum)
Transition of Care [facility]) - Progression Note    Patient Details  Name: Sydney [name] MRN: [id number] Date of Birth: 1931/02/14  Transition of Care [facility]) CM/SW Contact  Jacalyn Lefevre Edson Snowball, RN Phone Number: 01/31/2019, 11:29 AM  Clinical Narrative:     Spoke to patient's daughter Sydney [name]. Hospital bed, air mattress, hoyer lift and wheel chair were delivered last evening.   Sydney [name] asking if her mother is discharged today , when would [facility] start of care date be. Called Valerie with [facility], she will call branch and find out start of care date and call Sydney [name] directly. Sydney [name] aware and voiced understanding.   Sydney [name] called PT can see patient tomorrow, RN can see patient Friday. Dr Sloan Leiter aware. Sydney [name] will call Sydney [name] directly. Sydney [name] called Sydney [name].  Sydney [name] aware. Bedside nurse Suezanne Jacquet will teach Sydney [name] wound care and provide some dressings for home.   PTAR paperwork placed in shadow chart. Nurse can call when patient ready for discharge.  Expected Discharge Plan: [place] Barriers to Discharge: Continued Medical Work up  Expected Discharge Plan and Services Expected Discharge Plan: [place] In-house Referral: Ehtics Consult, Nutrition, Chaplain Discharge Planning Services: CM Consult Post Acute Care Choice: Durable Medical Equipment, Home Health Living arrangements for the past 2 months: [place]                 DME Arranged: Hospital bed, Wheelchair manual DME Agency: [employer] Date DME Agency Contacted: 01/25/19 Time DME Agency Contacted: 1128 Representative spoke with at DME Agency: zack HH Arranged: RN, PT [place] Agency: [place] Date [place]: 01/25/19 Time [place]: 1129 Representative spoke with at [place]: Tommi Rumps checking on cost of daily RN and PT visits   Social Determinants of Health (SDOH) Interventions    Readmission Risk Interventions Readmission Risk Prevention Plan 01/30/2019   Transportation Screening Complete  Medication Review Press photographer) Complete  PCP or Specialist appointment within 3-5 days of discharge Complete  HRI or [place] Complete  SW Recovery Care/Counseling Consult Complete  Palliative Care Screening Patient [place] Patient Refused  Some recent data might be hidden

## 2019-01-31 NOTE — Progress Notes (Signed)
Pt 's daughter educated on sacral dressing changes and given written instructions on how to change the dressing . She voiced understanding the instructions given. Dressing supplies also provided

## 2019-01-31 NOTE — Progress Notes (Addendum)
[facility] for Infectious Disease  Date of Admission:  12/19/2018        ASSESSMENT: Sydney [name] is a 85 y.o. 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 She has been admitted over the last 42 days with extensive work up to explain her FTT and other problems that have arisen since admission. We treated her 2 weeks ago for fever presumed to be due to UTI with good response. Currently surgery does not recommend, nor does the patient want to pursue, a surgical biopsy d/t anesthesia risk.   She has a non-tender abdomen, no fevers and no leukocytosis. Her bowel movements are described to be the same quality as prior to admission but increased frequency over the last 48 hours. I see no evidence of concern for repeat CDiff infection at this time.   I am concerned given the stools with her sacral wound and overall poor nutritional status. As of now there seems to be no evidence of infection but Sydney [name] is at high risk. I asked her daughter to consider trying Imodium to control frequency of stools. Would start with one dose a day to see how she responds with increasing per written instructions. When her sacral wound gets infected she will be in a tough spot for sure since it is extremely unlikely that this will heal given her current state.  She has had an air mattress delivered today and is also planning on going home with [place]. Appreciate Sydney [name] care.    PLAN: Recommend Imodium PRN for stool frequency  Stop Pantoprazole (increases CDiff risk)  Good wound care to prevent infection No indications for repeat CDiff testing    Principal Problem:   Adult failure to thrive Active Problems:   Pancytopenia ([place])   Hyponatremia   Protein calorie malnutrition (HCC)   History of TIA (transient ischemic attack)   Generalized weakness   Pressure injury of skin   Transaminitis   Malnutrition of moderate degree   Evaluation by  psychiatric service required   MDD (major depressive disorder)   . aspirin EC  81 mg Oral Daily  . chlorhexidine  15 mL Mouth Rinse BID  . enoxaparin (LOVENOX) injection  40 mg Subcutaneous Q24H  . feeding supplement  1 Container Oral TID BM  . feeding supplement (PRO-STAT SUGAR FREE 64)  30 mL Oral BID WC  . Gerhardt's butt cream   Topical TID  . latanoprost  1 drop Both Eyes QHS  . mouth rinse  15 mL Mouth Rinse q12n4p  . megestrol  400 mg Oral TID  . mirtazapine  30 mg Oral QHS  . multivitamin with minerals  1 tablet Oral Daily  . nystatin  5 mL Oral 5 X Daily  . nystatin   Topical BID  . pantoprazole  40 mg Oral Daily  . sodium chloride flush  3 mL Intravenous Q12H    SUBJECTIVE: Unable to participate in interview. Obtained via chart review and discussion with Sydney [name].   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 Last saw Sydney [name] in 01-2017 for the same and was having no trouble with symptoms at that time.   She has been admitted to [facility] since 8/24 with generalized weakness, failure to thrive. Sydney [name] saw her at that time at the request of the patient's daughter, Sydney [name], who is very worried about  recurrent CDiff infection should she receive antibiotics again. Stool CDiff testing was - on 01/03/19.  She developed fevers 101 F on the morning of 9/09 on hospital day 16 with a rise in WBC count. She was treated for presumed UTI (her daughter refused an I&O cath to confirm) with 4 days of antibiotics (ceftriaxone --> PO cephalexin) with good response. We also prophylaxed her for CDI with BID Vancomycin to be cautious. She did well and had no evidence of recurrent CDI at that time and ID team signed off.   She is now looking at discharging home to her daughter's house for care after an extensive work up with multiple specialties. She was previously being fed via cortrak enteric tube but this has since been D/C'd  per daughter's request. She has very poor intake of any oral foods and only sips on occasional nutritional supplements from what I can tell in the chart. Her daughter is concerned because she has developed diarrhea and worried about taking her home.  The patient has had no fevers, no leukocytosis and has no abdominal pain. Her LFTs have normalized. Her renal function has remained stable stage 3. There is now a question whether she may have an inflammatory process contributing with possible myositis of the thigh identified on MRI, neurology is following. General surgery has been consulted for muscle biopsy.   In speaking to her daughter directly she states that her mom normally prior to this admission has soft/thick toothpaste consistency BMs 3-4 daily. She has had only Gatorade, applesauce to take pills and occasional Boost Breeze    Review of Systems: Review of Systems  Unable to perform ROS: Dementia    Allergies  Allergen Reactions  . Demerol [Meperidine] Other (See Comments)    Excessive sweating, cramping  . Other     No Antibiotic without consultation with her primary physician.  . Codeine Rash  . Sulfa Antibiotics Itching and Other (See Comments)    Reaction unknown  . Tomato Itching    OBJECTIVE: Vitals:   01/29/19 2148 01/30/19 0523 01/30/19 2054 01/31/19 0536  BP: 131/84 (!) 145/79 135/80 (!) 147/85  Pulse: 96 100 100 100  Resp: 20 (!) 22 (!) 22 20  Temp: 98.1 F (36.7 C) 98.9 F (37.2 C) 98.1 F (36.7 C) 97.7 F (36.5 C)  TempSrc: Oral Oral Oral Oral  SpO2: 100% 98% 99% 99%  Weight:      Height:       Body mass index is 23.59 kg/m.  Physical Exam Vitals signs and nursing note reviewed.  Constitutional:      Comments: Cachectic. Resting in bed comfortably.   HENT:     Mouth/Throat:     Mouth: Mucous membranes are dry.  Cardiovascular:     Rate and Rhythm: Normal rate and regular rhythm.  Pulmonary:     Effort: Pulmonary effort is normal.     Breath  sounds: Normal breath sounds.  Abdominal:     General: There is no distension.     Tenderness: There is no abdominal tenderness.  Musculoskeletal:        General: No swelling.  Skin:    General: Skin is warm and dry.     Capillary Refill: Capillary refill takes less than 2 seconds.  Neurological:     Mental Status: She is disoriented.    Lab Results Lab Results  Component Value Date   WBC 7.3 01/29/2019   HGB 10.6 (L) 01/29/2019   HCT 33.3 (L) 01/29/2019  MCV 100.3 (H) 01/29/2019   PLT 257 01/29/2019    Lab Results  Component Value Date   CREATININE 0.81 01/31/2019   BUN 14 01/31/2019   NA 133 (L) 01/31/2019   K 3.6 01/31/2019   CL 108 01/31/2019   CO2 15 (L) 01/31/2019    Lab Results  Component Value Date   ALT 27 01/31/2019   AST 39 01/31/2019   ALKPHOS 161 (H) 01/31/2019   BILITOT 1.0 01/31/2019     Microbiology: No results found for this or any previous visit (from the past 240 hour(s)).   Janene Madeira, MSN, NP-C [facility] for Infectious Disease [email or web address] Pager: [phone] Office: [phone] [place]: [phone]

## 2019-01-31 NOTE — Progress Notes (Signed)
BRIEF NOTE  I was called by the primary hospitalist Dr.Ghimire with discussion about further management. Patient's daughter has been unwilling to proceed with a muscle biopsy. MRI of the muscle is incomplete without contrast and does not give a good indication of a muscular inflammatory process. Normal CK and aldolase also go against myositis although there are certain percentage of patients that might have a normal CK and aldolase while they have myositis. I discussed all of this with the daughter in detail. I also discussed the possibility of trial of steroids but cautioned against the large number of side effects especially in a frail 85 year old woman who is already sick with failure to thrive, prior history of C. difficile with fecal transplant, sacral decubitus ulcers and problems with UTIs. The daughter understands that steroids without a definitive diagnosis of myositis would not be a good idea and would not like to pursue that. Nonspecific elevation of ESR and CRP does not go in favor of polymyalgia rheumatica given that she has a sacral decubitus ulcer which can explain the elevated acute phase reactants.  In conclusion, the patient will go home with the daughter per her choice, will try to get her nutritional status improved and follow-up with outpatient neurology to continue to work on etiology for the lower extremity weakness.  Given the negative imaging studies for stroke-cerebral as well as spinal, it is unlikely that she had a stroke but I suspect that the systemic process might have led to weakness.  I answered all the questions of the daughter to the best of my ability  I discussed the case in detail with Dr. Sloan Leiter  -- Amie [place], MD [facility] Pager: [phone] If 7pm to 7am, please call on call as listed on AMION.

## 2019-01-31 NOTE — Progress Notes (Signed)
I spoke with the daughter about the details and risks of general anesthesia. The daughter did speak to the patient about surgery. The daughter stated that she and her mother would not like to proceed with any surgery at this time.   We will sign off. Please page [place] with any further needs for this patient.    Jackson Latino, [facility] Surgery Pager [phone]

## 2019-01-31 NOTE — Progress Notes (Signed)
I had a long conversation with patient's daughter yesterday regarding various options for dx of myositis. I am really not sure whether myositis fits her overall clinical picture-but question has been raised which has led to w/u including a non specific finding on the MRI left thigh. Neurology recommended that we touch base with [facility] to see if a muscle bx can be obtained. I have given handout/info on Myositis to daughter from 28.   We discussed the inherent risk of anesthesia in obtaining a biopsy is this frail patient-we discussed treating her empirically with steroids for a few weeks to see if she would improve-we also discussed not doing either or biopsy or steroids-and just letting things be. We spent close to 40 min talking through various options. Daughter is well aware that patients fraily/poor health, poor nutritional status might be too high of a surgical risk to perform a muscle bx under gen anesthesia.   The daughter told me that she will touch base with the patient (per daughter she will let the patient make the decision) and let us know. Per my conversation with the patient his morning-she has not yet talked with her daughter yet, as yesterday was a rough day with her. Per my very prelim talk with the patient-she is not keen on pursuing surgical intervention. Will await arrival of daughter later this morning.

## 2019-01-31 NOTE — Progress Notes (Signed)
Patient and daughter have had a chance to meet with the surgical team.  They have elected not to pursue a muscle biopsy.  Patient essentially appears the same-slowly continues to deteriorate with severe failure to thrive syndrome-has very poor appetite and does get hypoglycemic episodes mostly in the morning.  Only issue is time is patient's daughter wanting to have 1 last conversation with neurology-she is still debating whether to empirically start on steroids (without muscle biopsy) or just continue with current supportive care.  Patient has been asking for discharge almost every day-since we are really not doing any active medical treatment-and no further work-up is planned for failure to thrive syndrome.  I think it is reasonable to try to see if a change in scenario/environment (patient being back home) will "perk" her up a bit more so that her appetite and spirit will improve.  Patient's daughter and this MD have had discussions regarding this for the past 6 days.  Daughter is very well aware that at some point if she continues to deteriorate-she will likely be terminal and probably would benefit from hospice care.  Daughter specifically tells me-that she not want to pursue hospice at this time as she would try to get her home and restore her health.  At this point I feel that it is a reasonable to try to see if her spirit/diet/FTT improve once she is back to her home environment.  I had a discussion with Dr. Andria Frames over the phone-ethics committee-regarding above-he does agree and is supportive with this approach since we really have no further work-up/treatment to offer at this time when hospitalized.   Note-daughter is very well aware that if the patient deteriorates-she has the option of getting the patient back to the hospital.   We will plan discharge later today-please see discharge summary for details.

## 2019-01-31 NOTE — Telephone Encounter (Signed)
I spoke with Dr. Sloan Leiter - will see her to get an update on things since Dr. Jules Schick and my last encounter with her to help.

## 2019-01-31 NOTE — Progress Notes (Signed)
Pt picked up by ptar this evening

## 2019-02-01 MED FILL — GERHARDT'S BUTT CREAM BULK: 20 days supply | Qty: 60 | Fill #0

## 2019-02-02 ENCOUNTER — Emergency Department (HOSPITAL_COMMUNITY): Payer: Medicare Other

## 2019-02-02 ENCOUNTER — Inpatient Hospital Stay (HOSPITAL_COMMUNITY)
Admission: EM | Admit: 2019-02-02 | Discharge: 2019-02-13 | DRG: 592 | Disposition: A | Discharge status: Home Health | Payer: Medicare Other | Attending: Internal Medicine | Admitting: Internal Medicine

## 2019-02-02 ENCOUNTER — Other Ambulatory Visit: Payer: Self-pay

## 2019-02-02 ENCOUNTER — Encounter: Payer: Self-pay | Admitting: Gynecology

## 2019-02-02 ENCOUNTER — Encounter (HOSPITAL_COMMUNITY): Payer: Self-pay | Admitting: Radiology

## 2019-02-02 DIAGNOSIS — B370 Candidal stomatitis: Secondary | ICD-10-CM | POA: Diagnosis present

## 2019-02-02 DIAGNOSIS — Z79818 Long term (current) use of other agents affecting estrogen receptors and estrogen levels: Secondary | ICD-10-CM

## 2019-02-02 DIAGNOSIS — F330 Major depressive disorder, recurrent, mild: Secondary | ICD-10-CM | POA: Diagnosis present

## 2019-02-02 DIAGNOSIS — N1330 Unspecified hydronephrosis: Secondary | ICD-10-CM | POA: Diagnosis present

## 2019-02-02 DIAGNOSIS — B9561 Methicillin susceptible Staphylococcus aureus infection as the cause of diseases classified elsewhere: Secondary | ICD-10-CM | POA: Diagnosis present

## 2019-02-02 DIAGNOSIS — R0989 Other specified symptoms and signs involving the circulatory and respiratory systems: Secondary | ICD-10-CM

## 2019-02-02 DIAGNOSIS — Z8249 Family history of ischemic heart disease and other diseases of the circulatory system: Secondary | ICD-10-CM

## 2019-02-02 DIAGNOSIS — L8993 Pressure ulcer of unspecified site, stage 3: Secondary | ICD-10-CM | POA: Diagnosis present

## 2019-02-02 DIAGNOSIS — Z881 Allergy status to other antibiotic agents status: Secondary | ICD-10-CM | POA: Diagnosis not present

## 2019-02-02 DIAGNOSIS — Z86011 Personal history of benign neoplasm of the brain: Secondary | ICD-10-CM

## 2019-02-02 DIAGNOSIS — Z8673 Personal history of transient ischemic attack (TIA), and cerebral infarction without residual deficits: Secondary | ICD-10-CM

## 2019-02-02 DIAGNOSIS — Z515 Encounter for palliative care: Secondary | ICD-10-CM | POA: Diagnosis not present

## 2019-02-02 DIAGNOSIS — L98429 Non-pressure chronic ulcer of back with unspecified severity: Secondary | ICD-10-CM | POA: Diagnosis present

## 2019-02-02 DIAGNOSIS — H9190 Unspecified hearing loss, unspecified ear: Secondary | ICD-10-CM | POA: Diagnosis present

## 2019-02-02 DIAGNOSIS — Z681 Body mass index (BMI) 19 or less, adult: Secondary | ICD-10-CM | POA: Diagnosis not present

## 2019-02-02 DIAGNOSIS — R748 Abnormal levels of other serum enzymes: Secondary | ICD-10-CM | POA: Diagnosis present

## 2019-02-02 DIAGNOSIS — R627 Adult failure to thrive: Secondary | ICD-10-CM | POA: Diagnosis present

## 2019-02-02 DIAGNOSIS — Z8719 Personal history of other diseases of the digestive system: Secondary | ICD-10-CM | POA: Diagnosis not present

## 2019-02-02 DIAGNOSIS — I739 Peripheral vascular disease, unspecified: Secondary | ICD-10-CM | POA: Diagnosis present

## 2019-02-02 DIAGNOSIS — J9601 Acute respiratory failure with hypoxia: Secondary | ICD-10-CM | POA: Diagnosis present

## 2019-02-02 DIAGNOSIS — E871 Hypo-osmolality and hyponatremia: Secondary | ICD-10-CM | POA: Diagnosis not present

## 2019-02-02 DIAGNOSIS — E876 Hypokalemia: Secondary | ICD-10-CM

## 2019-02-02 DIAGNOSIS — R451 Restlessness and agitation: Secondary | ICD-10-CM | POA: Diagnosis not present

## 2019-02-02 DIAGNOSIS — R338 Other retention of urine: Secondary | ICD-10-CM | POA: Diagnosis not present

## 2019-02-02 DIAGNOSIS — Z7189 Other specified counseling: Secondary | ICD-10-CM | POA: Diagnosis not present

## 2019-02-02 DIAGNOSIS — E860 Dehydration: Secondary | ICD-10-CM | POA: Diagnosis present

## 2019-02-02 DIAGNOSIS — Z79899 Other long term (current) drug therapy: Secondary | ICD-10-CM

## 2019-02-02 DIAGNOSIS — R0602 Shortness of breath: Secondary | ICD-10-CM | POA: Diagnosis not present

## 2019-02-02 DIAGNOSIS — F329 Major depressive disorder, single episode, unspecified: Secondary | ICD-10-CM | POA: Diagnosis present

## 2019-02-02 DIAGNOSIS — E872 Acidosis: Secondary | ICD-10-CM | POA: Diagnosis present

## 2019-02-02 DIAGNOSIS — I700 Atherosclerosis of aorta: Secondary | ICD-10-CM | POA: Diagnosis present

## 2019-02-02 DIAGNOSIS — Z8619 Personal history of other infectious and parasitic diseases: Secondary | ICD-10-CM

## 2019-02-02 DIAGNOSIS — L89154 Pressure ulcer of sacral region, stage 4: Secondary | ICD-10-CM | POA: Diagnosis present

## 2019-02-02 DIAGNOSIS — I5032 Chronic diastolic (congestive) heart failure: Secondary | ICD-10-CM | POA: Diagnosis present

## 2019-02-02 DIAGNOSIS — R64 Cachexia: Secondary | ICD-10-CM | POA: Diagnosis present

## 2019-02-02 DIAGNOSIS — E46 Unspecified protein-calorie malnutrition: Secondary | ICD-10-CM | POA: Diagnosis present

## 2019-02-02 DIAGNOSIS — D649 Anemia, unspecified: Secondary | ICD-10-CM | POA: Diagnosis present

## 2019-02-02 DIAGNOSIS — F0390 Unspecified dementia without behavioral disturbance: Secondary | ICD-10-CM | POA: Diagnosis present

## 2019-02-02 DIAGNOSIS — Z885 Allergy status to narcotic agent status: Secondary | ICD-10-CM | POA: Diagnosis not present

## 2019-02-02 DIAGNOSIS — L089 Local infection of the skin and subcutaneous tissue, unspecified: Secondary | ICD-10-CM | POA: Diagnosis not present

## 2019-02-02 DIAGNOSIS — E162 Hypoglycemia, unspecified: Secondary | ICD-10-CM | POA: Diagnosis not present

## 2019-02-02 DIAGNOSIS — L02212 Cutaneous abscess of back [any part, except buttock]: Secondary | ICD-10-CM | POA: Diagnosis present

## 2019-02-02 DIAGNOSIS — I340 Nonrheumatic mitral (valve) insufficiency: Secondary | ICD-10-CM | POA: Diagnosis not present

## 2019-02-02 DIAGNOSIS — G459 Transient cerebral ischemic attack, unspecified: Secondary | ICD-10-CM | POA: Diagnosis present

## 2019-02-02 DIAGNOSIS — G92 Toxic encephalopathy: Secondary | ICD-10-CM | POA: Diagnosis present

## 2019-02-02 DIAGNOSIS — R531 Weakness: Secondary | ICD-10-CM

## 2019-02-02 DIAGNOSIS — Z7982 Long term (current) use of aspirin: Secondary | ICD-10-CM

## 2019-02-02 DIAGNOSIS — Z91018 Allergy to other foods: Secondary | ICD-10-CM | POA: Diagnosis not present

## 2019-02-02 DIAGNOSIS — I110 Hypertensive heart disease with heart failure: Secondary | ICD-10-CM | POA: Diagnosis present

## 2019-02-02 DIAGNOSIS — L89159 Pressure ulcer of sacral region, unspecified stage: Secondary | ICD-10-CM

## 2019-02-02 DIAGNOSIS — Z20828 Contact with and (suspected) exposure to other viral communicable diseases: Secondary | ICD-10-CM | POA: Diagnosis present

## 2019-02-02 DIAGNOSIS — Z7902 Long term (current) use of antithrombotics/antiplatelets: Secondary | ICD-10-CM

## 2019-02-02 DIAGNOSIS — B954 Other streptococcus as the cause of diseases classified elsewhere: Secondary | ICD-10-CM | POA: Diagnosis present

## 2019-02-02 DIAGNOSIS — E43 Unspecified severe protein-calorie malnutrition: Secondary | ICD-10-CM | POA: Diagnosis present

## 2019-02-02 DIAGNOSIS — N179 Acute kidney failure, unspecified: Secondary | ICD-10-CM | POA: Diagnosis present

## 2019-02-02 DIAGNOSIS — L8990 Pressure ulcer of unspecified site, unspecified stage: Secondary | ICD-10-CM | POA: Diagnosis not present

## 2019-02-02 DIAGNOSIS — R5381 Other malaise: Secondary | ICD-10-CM | POA: Diagnosis not present

## 2019-02-02 DIAGNOSIS — R062 Wheezing: Secondary | ICD-10-CM | POA: Diagnosis present

## 2019-02-02 DIAGNOSIS — R4702 Dysphasia: Secondary | ICD-10-CM | POA: Diagnosis present

## 2019-02-02 DIAGNOSIS — Z823 Family history of stroke: Secondary | ICD-10-CM

## 2019-02-02 DIAGNOSIS — I361 Nonrheumatic tricuspid (valve) insufficiency: Secondary | ICD-10-CM | POA: Diagnosis not present

## 2019-02-02 DIAGNOSIS — R270 Ataxia, unspecified: Secondary | ICD-10-CM | POA: Diagnosis present

## 2019-02-02 DIAGNOSIS — F320 Major depressive disorder, single episode, mild: Secondary | ICD-10-CM | POA: Diagnosis not present

## 2019-02-02 DIAGNOSIS — T8149XA Infection following a procedure, other surgical site, initial encounter: Secondary | ICD-10-CM | POA: Diagnosis not present

## 2019-02-02 DIAGNOSIS — Z882 Allergy status to sulfonamides status: Secondary | ICD-10-CM

## 2019-02-02 DIAGNOSIS — M810 Age-related osteoporosis without current pathological fracture: Secondary | ICD-10-CM | POA: Diagnosis present

## 2019-02-02 DIAGNOSIS — D72829 Elevated white blood cell count, unspecified: Secondary | ICD-10-CM | POA: Diagnosis not present

## 2019-02-02 LAB — CBC WITH DIFFERENTIAL/PLATELET
Abs Immature Granulocytes: 0.17 10*3/uL — ABNORMAL HIGH (ref 0.00–0.07)
Basophils Absolute: 0 10*3/uL (ref 0.0–0.1)
Basophils Relative: 0 %
Eosinophils Absolute: 0 10*3/uL (ref 0.0–0.5)
Eosinophils Relative: 0 %
HCT: 30.3 % — ABNORMAL LOW (ref 36.0–46.0)
Hemoglobin: 9.5 g/dL — ABNORMAL LOW (ref 12.0–15.0)
Immature Granulocytes: 1 %
Lymphocytes Relative: 4 %
Lymphs Abs: 0.5 10*3/uL — ABNORMAL LOW (ref 0.7–4.0)
MCH: 31.1 pg (ref 26.0–34.0)
MCHC: 31.4 g/dL (ref 30.0–36.0)
MCV: 99.3 fL (ref 80.0–100.0)
Monocytes Absolute: 0.8 10*3/uL (ref 0.1–1.0)
Monocytes Relative: 6 %
Neutro Abs: 12.2 10*3/uL — ABNORMAL HIGH (ref 1.7–7.7)
Neutrophils Relative %: 89 %
Platelets: 245 10*3/uL (ref 150–400)
RBC: 3.05 MIL/uL — ABNORMAL LOW (ref 3.87–5.11)
RDW: 18.1 % — ABNORMAL HIGH (ref 11.5–15.5)
WBC: 13.7 10*3/uL — ABNORMAL HIGH (ref 4.0–10.5)
nRBC: 0 % (ref 0.0–0.2)

## 2019-02-02 LAB — COMPREHENSIVE METABOLIC PANEL
ALT: 26 U/L (ref 0–44)
AST: 41 U/L (ref 15–41)
Albumin: 2.3 g/dL — ABNORMAL LOW (ref 3.5–5.0)
Alkaline Phosphatase: 158 U/L — ABNORMAL HIGH (ref 38–126)
Anion gap: 10 (ref 5–15)
BUN: 18 mg/dL (ref 8–23)
CO2: 13 mmol/L — ABNORMAL LOW (ref 22–32)
Calcium: 7.8 mg/dL — ABNORMAL LOW (ref 8.9–10.3)
Chloride: 114 mmol/L — ABNORMAL HIGH (ref 98–111)
Creatinine, Ser: 0.82 mg/dL (ref 0.44–1.00)
GFR calc Af Amer: >60 mL/min (ref >60)
GFR calc non Af Amer: >60 mL/min (ref >60)
Glucose, Bld: 85 mg/dL (ref 70–99)
Potassium: 3.2 mmol/L — ABNORMAL LOW (ref 3.5–5.1)
Sodium: 137 mmol/L (ref 135–145)
Total Bilirubin: 1 mg/dL (ref 0.3–1.2)
Total Protein: 6.5 g/dL (ref 6.5–8.1)

## 2019-02-02 LAB — LACTIC ACID, PLASMA: Lactic Acid, Venous: 1.5 mmol/L (ref 0.5–1.9)

## 2019-02-02 MED ORDER — IOHEXOL 300 MG/ML  SOLN
75.0000 mL | Freq: Once | INTRAMUSCULAR | Status: AC | PRN
  Administered 2019-02-02: 17:00:00 75 mL via INTRAVENOUS

## 2019-02-02 MED ORDER — ACETAMINOPHEN 650 MG RE SUPP
650.0000 mg | Freq: Four times a day (QID) | RECTAL | Status: DC | PRN
  Administered 2019-02-05 – 2019-02-13 (×9): 650 mg via RECTAL
  Filled 2019-02-02 (×9): qty 1

## 2019-02-02 MED ORDER — SODIUM CHLORIDE 0.9 % IV BOLUS
500.0000 mL | Freq: Once | INTRAVENOUS | Status: AC
  Administered 2019-02-02: 19:00:00 500 mL via INTRAVENOUS

## 2019-02-02 MED ORDER — ENOXAPARIN SODIUM 40 MG/0.4ML ~~LOC~~ SOLN
40.0000 mg | SUBCUTANEOUS | Status: DC
  Administered 2019-02-02: 40 mg via SUBCUTANEOUS
  Filled 2019-02-02 (×2): qty 0.4

## 2019-02-02 MED ORDER — ACETAMINOPHEN 325 MG PO TABS
650.0000 mg | ORAL_TABLET | Freq: Four times a day (QID) | ORAL | Status: DC | PRN
  Administered 2019-02-03: 20:00:00 650 mg via ORAL
  Filled 2019-02-02 (×3): qty 2

## 2019-02-02 MED ORDER — ACETAMINOPHEN 325 MG PO TABS: 650.0000 mg | ORAL_TABLET | Freq: Once | ORAL | Status: DC

## 2019-02-02 MED ORDER — NYSTATIN 100000 UNIT/ML MT SUSP
5.0000 mL | Freq: Four times a day (QID) | OROMUCOSAL | Status: DC
  Administered 2019-02-03 – 2019-02-13 (×18): 500000 [IU] via ORAL
  Filled 2019-02-02 (×26): qty 5

## 2019-02-02 NOTE — ED Notes (Signed)
Pt daughter advises that she cannot have morphine, tramadol, or ativan. Daughter says that she must be contacted with anything for pain stronger than tylenol.

## 2019-02-02 NOTE — ED Notes (Signed)
Patient taken up by Transport

## 2019-02-02 NOTE — TOC Initial Note (Signed)
Transition of Care [facility]) - Initial/Assessment Note    Patient Details  Name: Sydney [name] MRN: [id number] Date of Birth: 08-13-1930  Transition of Care [facility]) CM/SW Contact:    Erenest Rasher, RN Phone Number: [phone] 02/02/2019, 2:49 PM  Clinical Narrative:                 Spoke to pt and daughter, [name] at bedside. Recent hospital stay and ED visit. Pt having some shortness of breath. Pt may need oxygen at dc. Pt active with [place]. Has all needed DME at home. Notified [place] of ED visit. TOC CM will continue to follow for dc needs.   Expected Discharge Plan: [place] Barriers to Discharge: Continued Medical Work up   Patient Goals and CMS Choice Patient states their goals for this hospitalization and ongoing recovery are:: prefer she goes home CMS Medicare.gov Compare Post Acute Care list provided to:: Patient Represenative (must comment)([name] -daughter) Choice offered to / list presented to : Adult Children  Expected Discharge Plan and Services Expected Discharge Plan: [place] In-house Referral: Clinical Social Work Discharge Planning Services: CM Consult Post Acute Care Choice: Durable Medical Equipment, Home Health Living arrangements for the past 2 months: Single Family Home                           HH Arranged: RN, PT, Nurse's Aide [place] Agency: [place] ([place]) Date [place]: 02/02/19 Time [place]: 1448 Representative spoke with at [place]: [place]  Prior Living Arrangements/Services Living arrangements for the past 2 months: [place] with:: Adult Children Patient language and need for interpreter reviewed:: Yes        Need for Family Participation in Patient Care: Yes (Comment) Care giver support system in place?: Yes (comment) Current home services: DME, Home RN, Homehealth aide, Home OT, Home PT(rolling walker,  hospital bed, air mattress, hoyer lift, wheelchair) Criminal Activity/Legal Involvement Pertinent to Current Situation/Hospitalization: No - Comment as needed  Activities of Daily Living      Permission Sought/Granted Permission sought to share information with : Case Manager, PCP, Family Supports Permission granted to share information with : Yes, Verbal Permission Granted  Share Information with NAME: Laneya Kriesel  Permission granted to share info w AGENCY: [place] granted to share info w Relationship: daughter  Permission granted to share info w Contact Information: [phone]  Emotional Assessment Appearance:: Appears stated age Attitude/Demeanor/Rapport: Engaged Affect (typically observed): Accepting Orientation: : Oriented to Self, Oriented to Place, Oriented to Situation   Psych Involvement: No (comment)  Admission diagnosis:  code sepsis Patient Active Problem List   Diagnosis Date Noted  . Abdominal pain   . B12 deficiency   . Loose stools   . MDD (major depressive disorder) 01/12/2019  . Adult failure to thrive 01/02/2019  . Evaluation by psychiatric service required   . Malnutrition of moderate degree 12/20/2018  . Elevated LFTs   . Near syncope   . General weakness 12/17/2018  . Generalized weakness 12/16/2018  . Pressure injury of skin 12/16/2018  . Transaminitis 12/16/2018  . Ataxia 10/02/2017  . History of TIA (transient ischemic attack) 10/02/2017  . HTN (hypertension) 10/02/2017  . HLD (hyperlipidemia) 10/02/2017  . Palpable mass of neck-?? lymph node 10/02/2017  . Protein-calorie malnutrition, severe 10/20/2016  . Hypertension 10/17/2016  .  High cholesterol 10/17/2016  . History of Clostridioides difficile colitis 10/17/2016  . Dehydration with hyponatremia 10/17/2016  . Acute hypokalemia 10/17/2016  . Protein calorie malnutrition ([place]) 10/17/2016  . C. difficile colitis 10/05/2016  . Physical deconditioning 10/05/2016   . Recurrent Clostridium difficile diarrhea 10/05/2016  . Balance problem 10/05/2016  . History of transient ischemic attack (TIA) 10/05/2016  . Dehydration   . Hypokalemia 05/14/2016  . Anemia 05/14/2016  . Hyponatremia 05/14/2016  . Glaucoma 05/14/2016  . Meningioma ([place]) 05/01/2015  . 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 Diplopia 04/08/2015  . Pancytopenia ([place]) 04/08/2015  . Bradycardia 02/17/2013  . Occlusion and stenosis of carotid artery without mention of cerebral infarction 02/17/2013   PCP:  [name], MD Pharmacy:   [facility] [place] [address] [place] [address] Phone: [phone] Fax: [phone]     Social Determinants of Health (SDOH) Interventions    Readmission Risk Interventions Readmission Risk Prevention Plan 01/30/2019  Transportation Screening Complete  Medication Review ([place]) Complete  PCP or Specialist appointment within 3-5 days of discharge Complete  HRI or [place] Complete  SW Recovery Care/Counseling Consult Complete  Palliative Care Screening Patient [place] Patient Refused  Some recent data might be hidden

## 2019-02-02 NOTE — ED Notes (Signed)
Dr. Sedonia Small given pt's sacral wound dimensions from home health nurse.

## 2019-02-02 NOTE — ED Notes (Signed)
ED TO INPATIENT HANDOFF REPORT  ED Nurse Name and Phone #: Fredonia Highland [phone]  S Name/Age/Gender Gay Filler 85 y.o. female Room/Bed: WA21/WA21  Code Status   Code Status: Full Code  Home/SNF/Other Home Patient oriented to: self, place, time and situation Is this baseline? Yes   Triage Complete: Triage complete  Chief Complaint code sepsis  Triage Note Per EMS: Pt from home, pt's home-health nurse called out for "breathing troubles".  Pt tachypnic on arrival.  Tachycardic around 106.  Pt's temp 99.  Pt has decubitus ulcer on sacral area. Pt has had 1.5 L NS.  18 gauge each IV Et CO2 20     Allergies Allergies  Allergen Reactions  . Demerol [Meperidine] Other (See Comments)    Excessive sweating, cramping  . Other     No Antibiotic without consultation with her primary physician.  . Codeine Rash  . Sulfa Antibiotics Itching and Other (See Comments)    Reaction unknown  . Tomato Itching    Level of Care/Admitting Diagnosis ED Disposition    ED Disposition Condition Comment   Admit  Hospital Area: [facility] [[id number]]  Level of Care: Med-Surg [16]  Covid Evaluation: Asymptomatic Screening Protocol (No Symptoms)  Diagnosis: Sacral ulcer [facility]) [id number]  Admitting Physician: Orene Desanctis [id number]  Attending Physician: Orene Desanctis [id number]  Estimated length of stay: past midnight tomorrow  Certification:: I certify this patient will need inpatient services for at least 2 midnights  PT Class (Do Not Modify): Inpatient [101]  PT Acc Code (Do Not Modify): Private [1]       B Medical/Surgery History Past Medical History:  Diagnosis Date  . Anemia    years ago  . Aortic atherosclerosis ([place])   . Arthritis    "left hand" (02/17/2013)  . Bilateral inguinal hernia    INCIDENTAL CT 1/20  . C. difficile diarrhea   . Cataract   . Dehydration 12/29/2018  . Diplopia    CRANIAL 6 NERVE PALSY  . Dizzy   . Dyslipidemia   . Gastritis    . Glaucoma   . High cholesterol    "on RX years ago" (02/17/2013)  . Hypertension   . Leukopenia   . Meningioma ([place]) 05/01/2015  . Osteoporosis 02/2014   T score -2.5  followed by Dr. Lysle Rubens  . Palpitations    PACs, PVCs and short runs of atrial tachycardia on Holter monitoring  . PVD (peripheral vascular disease) ([place])   . Rhinitis   . Syncopal episodes 2003  . TIA (transient ischemic attack) 1990's  . Trigger thumb of left hand   . Weakness 12/29/2018  . Winter itch    Past Surgical History:  Procedure Laterality Date  . ANAL FISTULECTOMY  1970  . COLONOSCOPY    . COLONOSCOPY WITH PROPOFOL N/A 12/02/2016   Procedure: COLONOSCOPY WITH PROPOFOL;  Surgeon: Gatha Mayer, MD;  Location: [facility] ENDOSCOPY;  Service: Endoscopy;  Laterality: N/A;  . FECAL TRANSPLANT N/A 12/02/2016   Procedure: FECAL TRANSPLANT;  Surgeon: Gatha Mayer, MD;  Location: [facility] ENDOSCOPY;  Service: Endoscopy;  Laterality: N/A;  . TONSILLECTOMY       A IV Location/Drains/Wounds Patient Lines/Drains/Airways Status   Active Line/Drains/Airways    Name:   Placement date:   Placement time:   Site:   Days:   Peripheral IV 02/02/19 Left Antecubital   02/02/19    -    Antecubital   less than 1   Peripheral IV 02/02/19  Right Antecubital   02/02/19    -    Antecubital   less than 1          Intake/Output Last 24 hours  Intake/Output Summary (Last 24 hours) at 02/02/2019 2116 Last data filed at 02/02/2019 2037 Gross per 24 hour  Intake 500 ml  Output -  Net 500 ml    Labs/Imaging Results for orders placed or performed during the hospital encounter of 02/02/19 (from the past 48 hour(s))  Comprehensive metabolic panel     Status: Abnormal   Collection Time: 02/02/19  3:38 PM  Result Value Ref Range   Sodium 137 135 - 145 mmol/L   Potassium 3.2 (L) 3.5 - 5.1 mmol/L   Chloride 114 (H) 98 - 111 mmol/L   CO2 13 (L) 22 - 32 mmol/L   Glucose, Bld 85 70 - 99 mg/dL   BUN 18 8 - 23 mg/dL   Creatinine, Ser 0.82  0.44 - 1.00 mg/dL   Calcium 7.8 (L) 8.9 - 10.3 mg/dL   Total Protein 6.5 6.5 - 8.1 g/dL   Albumin 2.3 (L) 3.5 - 5.0 g/dL   AST 41 15 - 41 U/L   ALT 26 0 - 44 U/L   Alkaline Phosphatase 158 (H) 38 - 126 U/L   Total Bilirubin 1.0 0.3 - 1.2 mg/dL   GFR calc non Af Amer >60 >60 mL/min   GFR calc Af Amer >60 >60 mL/min   Anion gap 10 5 - 15    Comment: Performed at [facility], [place] [address] [place], [place] [address]  CBC with Differential     Status: Abnormal   Collection Time: 02/02/19  3:38 PM  Result Value Ref Range   WBC 13.7 (H) 4.0 - 10.5 K/uL   RBC 3.05 (L) 3.87 - 5.11 MIL/uL   Hemoglobin 9.5 (L) 12.0 - 15.0 g/dL   HCT 30.3 (L) 36.0 - 46.0 %   MCV 99.3 80.0 - 100.0 fL   MCH 31.1 26.0 - 34.0 pg   MCHC 31.4 30.0 - 36.0 g/dL   RDW 18.1 (H) 11.5 - 15.5 %   Platelets 245 150 - 400 K/uL   nRBC 0.0 0.0 - 0.2 %   Neutrophils Relative % 89 %   Neutro Abs 12.2 (H) 1.7 - 7.7 K/uL   Lymphocytes Relative 4 %   Lymphs Abs 0.5 (L) 0.7 - 4.0 K/uL   Monocytes Relative 6 %   Monocytes Absolute 0.8 0.1 - 1.0 K/uL   Eosinophils Relative 0 %   Eosinophils Absolute 0.0 0.0 - 0.5 K/uL   Basophils Relative 0 %   Basophils Absolute 0.0 0.0 - 0.1 K/uL   Immature Granulocytes 1 %   Abs Immature Granulocytes 0.17 (H) 0.00 - 0.07 K/uL    Comment: Performed at [facility], [place] [address], [place], [place] [address]  Lactic acid, plasma     Status: None   Collection Time: 02/02/19  8:05 PM  Result Value Ref Range   Lactic Acid, Venous 1.5 0.5 - 1.9 mmol/L    Comment: Performed at [facility], [place] [address] [place],  [address]   Ct Pelvis W Contrast  Result Date: 02/02/2019 CLINICAL DATA:  Sacral decubitus ulcer. EXAM: CT PELVIS WITH CONTRAST TECHNIQUE: Multidetector CT imaging of the pelvis was performed using the standard protocol following the bolus administration of intravenous contrast. CONTRAST:  71mL OMNIPAQUE IOHEXOL 300  MG/ML  SOLN COMPARISON:  05/02/2018 FINDINGS: Urinary Tract:  No abnormality visualized. Bowel:  Unremarkable visualized pelvic bowel loops. Vascular/Lymphatic: Atheromatous arterial calcifications without aneurysm. No enlarged lymph nodes. Reproductive: Small uterus containing multiple small, densely calcified fibroids. No adnexal mass. Other: Small superficial soft tissue defect posteriorly to the left of the coccyx. This does not extend to the bone and there is no bone destruction or periosteal reaction. Slightly more inferiorly, there is a very small fluid and gas collection, measuring 1.0 x 0.4 cm on image number 35 series 2 and 1.9 cm in length on coronal image number 78. Also noted is diffuse subcutaneous edema. The previously demonstrated bilateral inguinal hernias are no longer seen. Musculoskeletal: No bone destruction or periosteal reaction. Lower lumbar spine degenerative changes. IMPRESSION: 1. Small soft tissue ulcer posteriorly to the left of the coccyx with a 1.9 x 1.0 x 0.4 cm abscess slightly more inferiorly. 2. No evidence of osteomyelitis. 3. Diffuse subcutaneous edema. Electronically Signed   By: Claudie Revering M.D.   On: 02/02/2019 17:13   Dg Chest Port 1 View  Result Date: 02/02/2019 CLINICAL DATA:  Cough and tachypnea with tachycardia. EXAM: PORTABLE CHEST 1 VIEW COMPARISON:  01/23/2019 FINDINGS: Patient slightly rotated to the left. Lungs are adequately inflated demonstrate hazy bilateral perihilar opacification likely mild interstitial edema. Suggestion of small amount of bilateral pleural fluid. Left effusion improved compared to previous exams. Mild stable cardiomegaly. Remainder of the exam is unchanged. IMPRESSION: Evidence of mild interstitial edema and small bilateral pleural effusions with significant interval improvement in the left effusion. Central lung infection is possible but less likely. Mild stable cardiomegaly. Electronically Signed   By: Marin Olp M.D.   On:  02/02/2019 16:04    Pending Labs Unresulted Labs (From admission, onward)    Start     Ordered   02/03/19 [id number]  Basic metabolic panel  Tomorrow morning,   R     02/02/19 2048   02/03/19 0500  CBC  Tomorrow morning,   R     02/02/19 2048   02/02/19 1843  SARS CORONAVIRUS 2 (TAT 6-24 HRS) Nasopharyngeal Nasopharyngeal Swab  (Asymptomatic/Tier 2 Patients Labs)  Once,   STAT    Question Answer Comment  Is this test for diagnosis or screening Screening   Symptomatic for COVID-19 as defined by CDC No   Hospitalized for COVID-19 No   Admitted to ICU for COVID-19 No   Previously tested for COVID-19 Yes   Resident in a congregate (group) care setting No   Employed in healthcare setting No   Pregnant No      02/02/19 1842          Vitals/Pain Today's Vitals   02/02/19 2000 02/02/19 2024 02/02/19 2030 02/02/19 2100  BP: (!) 150/77 (!) 150/77 119/90 (!) 143/98  Pulse: (!) 104 (!) 103 (!) 109 (!) 115  Resp: (!) 30 (!) 33 18 (!) 29  Temp:      TempSrc:      SpO2: 100% 99% 100% 99%  Weight:      Height:        Isolation Precautions No active isolations  Medications Medications  enoxaparin (LOVENOX) injection 40 mg (has no administration in time range)  acetaminophen (TYLENOL) tablet 650 mg (has no administration in time range)    Or  acetaminophen (TYLENOL) suppository 650 mg (has no administration in time range)  nystatin (MYCOSTATIN) 100000 UNIT/ML suspension 500,000 Units (has no administration in time range)  iohexol (OMNIPAQUE) 300 MG/ML solution 75 mL (75 mLs Intravenous Contrast Given 02/02/19  1632)  sodium chloride 0.9 % bolus 500 mL (0 mLs Intravenous Stopped 02/02/19 2037)    Mobility non-ambulatory High fall risk   Focused Assessments Gen Med   R Recommendations: See Admitting Provider Note  Report given to: Adrianna RN  Additional Notes:

## 2019-02-02 NOTE — ED Triage Notes (Signed)
Per EMS: Pt from home, pt's home-health nurse called out for "breathing troubles".  Pt tachypnic on arrival.  Tachycardic around 106.  Pt's temp 99.  Pt has decubitus ulcer on sacral area. Pt has had 1.5 L NS.  18 gauge each IV Et CO2 20

## 2019-02-02 NOTE — ED Provider Notes (Signed)
[facility] DEPT Provider Note   CSN: [id number] Arrival date & time: 02/02/19  1234     History   Chief Complaint Chief Complaint  Patient presents with  . Cough  . Skin Ulcer    HPI EVON SIAM is a 85 y.o. female with multiple medical problems as below, presenting to the emergency department with her daughter with complaint of cough and sacral ulcer.  Patient just had a lengthy admission on 12/19/2018 and discharged on 01/31/2019.  Patient was ultimately determined to have failure to thrive and was discharged with offered her for hospice care though patient's family elected for home health nurse.  Patient's daughter states her home health nurse was concerned for infected sacral ulcer as well as for aspiration.  Patient's daughter states she has had what sounds like phlegm he has been unable to clear though this is been persistent even through her hospital stay and not really changed.  States she noticed she started coughing a little bit more last night, however no obvious episodes of choking.  She states her sacral wound looks the same as it did to her while patient was admitted in the hospital.  She denies any other new changes in patient's wellbeing since discharge.  Patient denies any complaints other than requesting a meal and something to drink.     The history is provided by the patient, a relative and medical records.    Past Medical History:  Diagnosis Date  . Anemia    years ago  . Aortic atherosclerosis ([place])   . Arthritis    "left hand" (02/17/2013)  . Bilateral inguinal hernia    INCIDENTAL CT 1/20  . C. difficile diarrhea   . Cataract   . Dehydration 12/29/2018  . Diplopia    CRANIAL 6 NERVE PALSY  . Dizzy   . Dyslipidemia   . Gastritis   . Glaucoma   . High cholesterol    "on RX years ago" (02/17/2013)  . Hypertension   . Leukopenia   . Meningioma ([place]) 05/01/2015  . Osteoporosis 02/2014   T score -2.5  followed by Dr.  Lysle Rubens  . Palpitations    PACs, PVCs and short runs of atrial tachycardia on Holter monitoring  . PVD (peripheral vascular disease) ([place])   . Rhinitis   . Syncopal episodes 2003  . TIA (transient ischemic attack) 1990's  . Trigger thumb of left hand   . Weakness 12/29/2018  . Winter itch     Patient Active Problem List   Diagnosis Date Noted  . Abdominal pain   . B12 deficiency   . Loose stools   . MDD (major depressive disorder) 01/12/2019  . Adult failure to thrive 01/02/2019  . Evaluation by psychiatric service required   . Malnutrition of moderate degree 12/20/2018  . Elevated LFTs   . Near syncope   . General weakness 12/17/2018  . Generalized weakness 12/16/2018  . Pressure injury of skin 12/16/2018  . Transaminitis 12/16/2018  . Ataxia 10/02/2017  . History of TIA (transient ischemic attack) 10/02/2017  . HTN (hypertension) 10/02/2017  . HLD (hyperlipidemia) 10/02/2017  . Palpable mass of neck-?? lymph node 10/02/2017  . Protein-calorie malnutrition, severe 10/20/2016  . Hypertension 10/17/2016  . High cholesterol 10/17/2016  . History of Clostridioides difficile colitis 10/17/2016  . Dehydration with hyponatremia 10/17/2016  . Acute hypokalemia 10/17/2016  . Protein calorie malnutrition ([place]) 10/17/2016  . C. difficile colitis 10/05/2016  . Physical deconditioning 10/05/2016  . Recurrent  Clostridium difficile diarrhea 10/05/2016  . Balance problem 10/05/2016  . History of transient ischemic attack (TIA) 10/05/2016  . Dehydration   . Hypokalemia 05/14/2016  . Anemia 05/14/2016  . Hyponatremia 05/14/2016  . Glaucoma 05/14/2016  . Meningioma ([place]) 05/01/2015  . 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 Diplopia 04/08/2015  . Pancytopenia ([place]) 04/08/2015  . Bradycardia 02/17/2013  . Occlusion and stenosis of carotid artery without mention of cerebral infarction 02/17/2013    Past Surgical History:  Procedure Laterality Date  . ANAL FISTULECTOMY  1970  .  COLONOSCOPY    . COLONOSCOPY WITH PROPOFOL N/A 12/02/2016   Procedure: COLONOSCOPY WITH PROPOFOL;  Surgeon: Gatha Mayer, MD;  [facility];  Service: Endoscopy;  Laterality: N/A;  . FECAL TRANSPLANT N/A 12/02/2016   Procedure: FECAL TRANSPLANT;  Surgeon: Gatha Mayer, MD;  Location: [facility] ENDOSCOPY;  Service: Endoscopy;  Laterality: N/A;  . TONSILLECTOMY       OB History    Gravida  1   Para  1   Term  1   Preterm      AB      Living  1     SAB      TAB      Ectopic      Multiple      Live Births               Home Medications    Prior to Admission medications   Medication Sig Start Date End Date Taking? Authorizing Provider  aspirin (ECOTRIN LOW STRENGTH) 81 MG EC tablet Take 81 mg by mouth daily. Swallow whole.   Yes [provider]  cholecalciferol (VITAMIN D) 1000 units tablet Take 1,000 Units by mouth daily.   Yes [provider]  clopidogrel (PLAVIX) 75 MG tablet Take 75 mg by mouth daily.   Yes [provider]  folic acid (FOLVITE) 1 MG tablet Take 1 mg by mouth daily.   Yes [provider]  Hydrocortisone (GERHARDT'S BUTT CREAM) CREA Apply 1 application topically 3 (three) times daily. Apply barrier cream to buttocks/sacrum TID and PRN with each incontinent cleaning session 01/31/19 03/02/19 Yes Ghimire, Henreitta Leber, MD  latanoprost (XALATAN) 0.005 % ophthalmic solution Place 1 drop into both eyes at bedtime.    Yes [provider]  loperamide (IMODIUM) 2 MG capsule Take 1 capsule (2 mg total) by mouth as needed for diarrhea or loose stools. 01/31/19  Yes Ghimire, Henreitta Leber, MD  megestrol (MEGACE) 400 MG/10ML suspension Take 10 mLs (400 mg total) by mouth 3 (three) times daily. 01/31/19 03/02/19 Yes Ghimire, Henreitta Leber, MD  mirtazapine (REMERON SOL-TAB) 45 MG disintegrating tablet Take 1 tablet (45 mg total) by mouth at bedtime. 01/31/19  Yes Ghimire, Henreitta Leber, MD  Multiple Vitamin (MULTIVITAMIN WITH MINERALS)  TABS tablet Take 1 tablet by mouth daily. 02/01/19  Yes Ghimire, Henreitta Leber, MD  nystatin (MYCOSTATIN) 100000 UNIT/ML suspension Take 5 mLs (500,000 Units total) by mouth 4 (four) times daily as needed for up to 10 days (thrush). 01/31/19 02/10/19 Yes Ghimire, Henreitta Leber, MD  nystatin (MYCOSTATIN/NYSTOP) powder Apply topically 2 (two) times daily. Apply to affected area on the skin folds 01/31/19  Yes Ghimire, Henreitta Leber, MD  pantoprazole (PROTONIX) 20 MG tablet Take 20 mg by mouth daily. 12/09/18  Yes [provider]  traMADol (ULTRAM) 50 MG tablet Take 0.5 tablets (25 mg total) by mouth every 12 (twelve) hours as needed for moderate pain. 01/31/19  Yes  Ghimire, Henreitta Leber, MD  Amino Acids-Protein Hydrolys (FEEDING SUPPLEMENT, PRO-STAT SUGAR FREE 64,) LIQD Take 30 mLs by mouth 2 (two) times daily with a meal. Patient not taking: Reported on 02/02/2019 01/31/19 03/02/19  Jonetta Osgood, MD    Family History Family History  Problem Relation Age of Onset  . Hypertension Father   . Heart attack Father   . Stroke Brother   . Stroke Maternal Grandmother     Social History Social History   Tobacco Use  . Smoking status: Never Smoker  . Smokeless tobacco: Never Used  Substance Use Topics  . Alcohol use: No  . Drug use: No     Allergies   Demerol [meperidine], Other, Codeine, Sulfa antibiotics, and Tomato   Review of Systems Review of Systems  Constitutional: Negative for fever.  Respiratory: Positive for cough.   Skin: Positive for wound.  All other systems reviewed and are negative.    Physical Exam Updated Vital Signs BP 129/74   Pulse 93   Temp 98.4 F (36.9 C) (Oral)   Resp (!) 22   Ht 5\' 6"  (1.676 m)   Wt 52.2 kg   SpO2 96%   BMI 18.56 kg/m   Physical Exam Vitals signs and nursing note reviewed.  Constitutional:      Appearance: She is well-developed.     Comments: Chronically-ill appearing, thin female  HENT:     Head: Normocephalic and atraumatic.   Eyes:     Conjunctiva/sclera: Conjunctivae normal.  Neck:     Musculoskeletal: Normal range of motion.  Cardiovascular:     Rate and Rhythm: Regular rhythm. Tachycardia present.  Pulmonary:     Effort: Pulmonary effort is normal.     Comments: rhoncherous breath sounds that clear somewhat with coughing. Abdominal:     General: Bowel sounds are normal.     Palpations: Abdomen is soft.     Tenderness: There is no abdominal tenderness.  Skin:    General: Skin is warm.     Comments: There is a quarter sized sacral ulcer with some packing material present.  There is no large surrounding erythema or warmth though area significantly tender.  Neurological:     Mental Status: She is alert.  Psychiatric:        Behavior: Behavior normal.      ED Treatments / Results  Labs (all labs ordered are listed, but only abnormal results are displayed) Labs Reviewed  COMPREHENSIVE METABOLIC PANEL - Abnormal; Notable for the following components:      Result Value   Potassium 3.2 (*)    Chloride 114 (*)    CO2 13 (*)    Calcium 7.8 (*)    Albumin 2.3 (*)    Alkaline Phosphatase 158 (*)    All other components within normal limits  CBC WITH DIFFERENTIAL/PLATELET - Abnormal; Notable for the following components:   WBC 13.7 (*)    RBC 3.05 (*)    Hemoglobin 9.5 (*)    HCT 30.3 (*)    RDW 18.1 (*)    Neutro Abs 12.2 (*)    Lymphs Abs 0.5 (*)    Abs Immature Granulocytes 0.17 (*)    All other components within normal limits  SARS CORONAVIRUS 2 (TAT 6-24 HRS)  LACTIC ACID, PLASMA  LACTIC ACID, PLASMA    EKG None  Radiology Ct Pelvis W Contrast  Result Date: 02/02/2019 CLINICAL DATA:  Sacral decubitus ulcer. EXAM: CT PELVIS WITH CONTRAST TECHNIQUE: Multidetector CT imaging of the  pelvis was performed using the standard protocol following the bolus administration of intravenous contrast. CONTRAST:  24mL OMNIPAQUE IOHEXOL 300 MG/ML  SOLN COMPARISON:  05/02/2018 FINDINGS: Urinary Tract:   No abnormality visualized. Bowel:  Unremarkable visualized pelvic bowel loops. Vascular/Lymphatic: Atheromatous arterial calcifications without aneurysm. No enlarged lymph nodes. Reproductive: Small uterus containing multiple small, densely calcified fibroids. No adnexal mass. Other: Small superficial soft tissue defect posteriorly to the left of the coccyx. This does not extend to the bone and there is no bone destruction or periosteal reaction. Slightly more inferiorly, there is a very small fluid and gas collection, measuring 1.0 x 0.4 cm on image number 35 series 2 and 1.9 cm in length on coronal image number 78. Also noted is diffuse subcutaneous edema. The previously demonstrated bilateral inguinal hernias are no longer seen. Musculoskeletal: No bone destruction or periosteal reaction. Lower lumbar spine degenerative changes. IMPRESSION: 1. Small soft tissue ulcer posteriorly to the left of the coccyx with a 1.9 x 1.0 x 0.4 cm abscess slightly more inferiorly. 2. No evidence of osteomyelitis. 3. Diffuse subcutaneous edema. Electronically Signed   By: Claudie Revering M.D.   On: 02/02/2019 17:13   Dg Chest Port 1 View  Result Date: 02/02/2019 CLINICAL DATA:  Cough and tachypnea with tachycardia. EXAM: PORTABLE CHEST 1 VIEW COMPARISON:  01/23/2019 FINDINGS: Patient slightly rotated to the left. Lungs are adequately inflated demonstrate hazy bilateral perihilar opacification likely mild interstitial edema. Suggestion of small amount of bilateral pleural fluid. Left effusion improved compared to previous exams. Mild stable cardiomegaly. Remainder of the exam is unchanged. IMPRESSION: Evidence of mild interstitial edema and small bilateral pleural effusions with significant interval improvement in the left effusion. Central lung infection is possible but less likely. Mild stable cardiomegaly. Electronically Signed   By: Marin Olp M.D.   On: 02/02/2019 16:04    Procedures Procedures (including critical care  time)  Medications Ordered in ED Medications  sodium chloride 0.9 % bolus 500 mL (has no administration in time range)  iohexol (OMNIPAQUE) 300 MG/ML solution 75 mL (75 mLs Intravenous Contrast Given 02/02/19 1632)     Initial Impression / Assessment and Plan / ED Course  I have reviewed the triage vital signs and the nursing notes.  Pertinent labs & imaging results that were available during my care of the patient were reviewed by me and considered in my medical decision making (see chart for details).  Clinical Course as of Feb 02 1843  Thu Feb 02, 2019  1427 Removed O2, pt remaining at 97% RA   [JR]  1437 Pt HR elevated after she was moved for examination of her sacral ulcer which caused some pain. Pt has HR around 100 during entire evaluation until she had some pain.  Pulse Rate(!): 123 [JR]    Clinical Course User Index [JR] Eduardo Wurth, [place] N, PA-C       Patient with multiple comorbidities, recently discharged from the hospital on 01/31/2019 after an extended stay failure to thrive, ultimately discharged home with offer for hospice care though was declined and went home with home health.  She presents today to the ED with concerns for worsening sacral ulcer.  she is noted to be tachycardic in the ED though afebrile.  Regarding patient's cough patient's daughter states this is relatively unchanged and her baseline.  Patient is on oxygen on arrival to the ED, I removed the supplemental oxygen during evaluation she remained at 97% O2 saturation on room air.  Patient discussed with  and evaluated by Dr. Sedonia Small.  Labs and CT pelvis with contrast ordered for further evaluation.  Chest x-ray shows interval improvement since previous.  However, there is a new leukocytosis of 13.7, up from 7.3 four days ago.  Her metabolic panel does appear to be at her baseline.  CT scan with abscess inferior to the ulcer.  This was discussed with on-call surgeon, Dr. Marlou Starks.  He will consult on the patient  during medical admission in the morning, though does not feel an emergent drainage today is indicated at this time.  A long discussion with patient's daughter about plan of care.  She elects for admission at this time for further management.  She declines any antibiotics until infectious diseases consulted given patient's extensive history of C. difficile.  Patient is followed by Dr. Graylon Good.  Hospitalist consulted for admission, Dr. Flossie Buffy accepting.  Final Clinical Impressions(s) / ED Diagnoses   Final diagnoses:  Pressure injury of skin of sacral region, unspecified injury stage    ED Discharge Orders    None       Briteny Fulghum, [place] N, PA-C 02/02/19 1844    Maudie Flakes, MD 02/04/19 1054

## 2019-02-02 NOTE — H&P (Addendum)
History and Physical    Sydney [name] KGM:[id number] DOB: 1931-04-24 DOA: 02/02/2019  PCP: [name], MD  Patient coming from: Home  I have personally briefly reviewed patient's old medical records in [place]  Chief Complaint: Wheezing and sacral wound  HPI: Sydney [name] is a 85 y.o. 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  Daughter who is her HCPOA provides most of the history.  Patient was recently hospitalized from 12/19/2018 and discharged 2 days ago on 10/6.  She was admitted for progressive weakness, malaise, fatigue and significant failure to thrive.  She also developed encephalopathy and worsening liver enzymes.  She had an extensive work-up and was seen by numerous subspecialists including ID, GI, oncology and neurology without any significant improvement in her failure to thrive syndrome.  She was discharged home with home health given daughter was resistant to SNF and hospice care.  Since discharge, daughter reports that she has not had any acute changes.  Her appetite continues to be poor.  Daughter was helping her to get supplemental home health nursing and had a nurse come evaluate her.  The nurse was concerned about wheezing on her respiratory exam and her sacral wound prompting her to come into the ED.  Daughter was unsure whether she was hypoxic at home.  Denies any fever, cough or shortness of breath.  Denies any chest pain.  Denies any nausea, vomiting or abdominal pain.  Patient has chronic diarrhea from her history of C. difficile with stool transplant and daughter has noticed increased frequency of her bowel movements. Daughter would like her to get her sacral wound care for and her goal was for her mother to be able to ambulate on her own. She does not want her mother to receive any narcotics or opioids  specifically morphine and tramadol since she felt it sedated her mother during prior admission.  Also does not want Ativan. Daughter states that she strongly wants [place] to consult infectious disease and GI prior to starting any antibiotics given her history.  ED Course: She was afebrile and had elevated blood pressure up to 164/100.  Also tachycardic up to 120s. CBC showed leukocytosis of 13.7 and hemoglobin of 9.5 which is around her baseline.  CMP showed potassium of 3.2, mildly elevated alkaline phosphatase 158 which is around her baseline.  Chest x-ray showed evidence of mild interstitial edema and small bilateral pleural effusion with significant interval improvement in the left effusion.  Central lung infection is possible but less likely.  Mild stable cardiomegaly. CT pelvis showed small soft tissue ulcer posterior to the left of the coccyx with a 1.9 x 1 x 0.4 cm abscess slightly more inferior.  No evidence of osteomyelitis.  Diffuse subcutaneous edema.   Review of Systems:  Constitutional:  No Fever ENT/Mouth: + sore throat, No Rhinorrhea Eyes: No Eye Pain, No Vision Changes Cardiovascular: No Chest Pain, no SOB Respiratory: No Cough, No Sputum, No Wheezing, no Dyspnea  Gastrointestinal: No Nausea, No Vomiting, +Diarrhea, No Constipation, No Pain Genitourinary: no Urinary Incontinence, No Urgency, No Flank Pain Musculoskeletal: No Arthralgias, No Myalgias Skin: No Skin Lesions, No Pruritus, Neuro: + Weakness, No Numbness,  No Loss of Consciousness, No Syncope Psych: No Anxiety/Panic, No Depression, + decrease appetite Heme/Lymph: No Bruising, No Bleeding  Past Medical History:  Diagnosis Date  . Anemia    years ago  .  Aortic atherosclerosis ([place])   . Arthritis    "left hand" (02/17/2013)  . Bilateral inguinal hernia    INCIDENTAL CT 1/20  . C. difficile diarrhea   . Cataract   . Dehydration 12/29/2018  . Diplopia    CRANIAL 6 NERVE PALSY  . Dizzy   . Dyslipidemia   .  Gastritis   . Glaucoma   . High cholesterol    "on RX years ago" (02/17/2013)  . Hypertension   . Leukopenia   . Meningioma ([place]) 05/01/2015  . Osteoporosis 02/2014   T score -2.5  followed by Dr. Lysle Rubens  . Palpitations    PACs, PVCs and short runs of atrial tachycardia on Holter monitoring  . PVD (peripheral vascular disease) ([place])   . Rhinitis   . Syncopal episodes 2003  . TIA (transient ischemic attack) 1990's  . Trigger thumb of left hand   . Weakness 12/29/2018  . Winter itch     Past Surgical History:  Procedure Laterality Date  . ANAL FISTULECTOMY  1970  . COLONOSCOPY    . COLONOSCOPY WITH PROPOFOL N/A 12/02/2016   Procedure: COLONOSCOPY WITH PROPOFOL;  Surgeon: Gatha Mayer, MD;  Location: [facility] ENDOSCOPY;  Service: Endoscopy;  Laterality: N/A;  . FECAL TRANSPLANT N/A 12/02/2016   Procedure: FECAL TRANSPLANT;  Surgeon: Gatha Mayer, MD;  Location: [facility] ENDOSCOPY;  Service: Endoscopy;  Laterality: N/A;  . TONSILLECTOMY       reports that she has never smoked. She has never used smokeless tobacco. She reports that she does not drink alcohol or use drugs.  Allergies  Allergen Reactions  . Demerol [Meperidine] Other (See Comments)    Excessive sweating, cramping  . Other     No Antibiotic without consultation with her primary physician.  . Codeine Rash  . Sulfa Antibiotics Itching and Other (See Comments)    Reaction unknown  . Tomato Itching    Family History  Problem Relation Age of Onset  . Hypertension Father   . Heart attack Father   . Stroke Brother   . Stroke Maternal Grandmother    Family history reviewed and not pertinent  Prior to Admission medications   Medication Sig Start Date End Date Taking? Authorizing Provider  aspirin (ECOTRIN LOW STRENGTH) 81 MG EC tablet Take 81 mg by mouth daily. Swallow whole.   Yes [provider]  cholecalciferol (VITAMIN D) 1000 units tablet Take 1,000 Units by mouth daily.   Yes [provider]   clopidogrel (PLAVIX) 75 MG tablet Take 75 mg by mouth daily.   Yes [provider]  folic acid (FOLVITE) 1 MG tablet Take 1 mg by mouth daily.   Yes [provider]  Hydrocortisone (GERHARDT'S BUTT CREAM) CREA Apply 1 application topically 3 (three) times daily. Apply barrier cream to buttocks/sacrum TID and PRN with each incontinent cleaning session 01/31/19 03/02/19 Yes Ghimire, Henreitta Leber, MD  latanoprost (XALATAN) 0.005 % ophthalmic solution Place 1 drop into both eyes at bedtime.    Yes [provider]  loperamide (IMODIUM) 2 MG capsule Take 1 capsule (2 mg total) by mouth as needed for diarrhea or loose stools. 01/31/19  Yes Ghimire, Henreitta Leber, MD  megestrol (MEGACE) 400 MG/10ML suspension Take 10 mLs (400 mg total) by mouth 3 (three) times daily. 01/31/19 03/02/19 Yes Ghimire, Henreitta Leber, MD  mirtazapine (REMERON SOL-TAB) 45 MG disintegrating tablet Take 1 tablet (45 mg total) by mouth at bedtime. 01/31/19  Yes Ghimire, Henreitta Leber, MD  Multiple Vitamin (  MULTIVITAMIN WITH MINERALS) TABS tablet Take 1 tablet by mouth daily. 02/01/19  Yes Ghimire, Henreitta Leber, MD  nystatin (MYCOSTATIN) 100000 UNIT/ML suspension Take 5 mLs (500,000 Units total) by mouth 4 (four) times daily as needed for up to 10 days (thrush). 01/31/19 02/10/19 Yes Ghimire, Henreitta Leber, MD  nystatin (MYCOSTATIN/NYSTOP) powder Apply topically 2 (two) times daily. Apply to affected area on the skin folds 01/31/19  Yes Ghimire, Henreitta Leber, MD  pantoprazole (PROTONIX) 20 MG tablet Take 20 mg by mouth daily. 12/09/18  Yes [provider]  traMADol (ULTRAM) 50 MG tablet Take 0.5 tablets (25 mg total) by mouth every 12 (twelve) hours as needed for moderate pain. 01/31/19  Yes Ghimire, Henreitta Leber, MD  Amino Acids-Protein Hydrolys (FEEDING SUPPLEMENT, PRO-STAT SUGAR FREE 64,) LIQD Take 30 mLs by mouth 2 (two) times daily with a meal. Patient not taking: Reported on 02/02/2019 01/31/19 03/02/19  Jonetta Osgood, MD     Physical Exam: Vitals:   02/02/19 1830 02/02/19 1845 02/02/19 1933 02/02/19 2024  BP: 133/70 133/70 (!) 146/89 (!) 150/77  Pulse: 91 (!) 104 (!) 113 (!) 103  Resp: (!) 21 (!) 22 (!) 31 (!) 33  Temp:      TempSrc:      SpO2: 100% 100% 98% 99%  Weight:      Height:        Constitutional: thin, cachectic elderly female sitting up at 40 degrees incline in bed and was coughing after attempting to eat chips.   Vitals:   02/02/19 1830 02/02/19 1845 02/02/19 1933 02/02/19 2024  BP: 133/70 133/70 (!) 146/89 (!) 150/77  Pulse: 91 (!) 104 (!) 113 (!) 103  Resp: (!) 21 (!) 22 (!) 31 (!) 33  Temp:      TempSrc:      SpO2: 100% 100% 98% 99%  Weight:      Height:       Eyes: PERRL, lids and conjunctivae normal ENMT: Significantly dry mucous membrane with scaly skin on the lips.  White film on tongue.  Patient is hard of hearing. Neck: normal, supple, no masses Respiratory: clear to auscultation bilaterally, no wheezing, no crackles. Normal respiratory effort. No accessory muscle use.  Cardiovascular: Regular rate and rhythm, no murmurs / rubs / gallops. No extremity edema. 2+ pedal pulses.  Abdomen: no tenderness, no masses palpated.  Bowel sounds positive.  Musculoskeletal: no clubbing / cyanosis. No joint deformity upper and lower extremities. Good ROM, no contractures. Normal muscle tone.  Skin: 3cm x 2cm deep sacral wound with guaze packing; no significant erythema or edema, no bleeding or drainage.  Neurologic: CN 2-12 grossly intact. Sensation intact, DTR normal. Strength 2/5 in lower extremity. Psychiatric: Normal judgment and insight. Alert and oriented x 3. Normal mood.    Labs on Admission: I have personally reviewed following labs and imaging studies  CBC: Recent Labs  Lab 01/29/19 1503 02/02/19 1538  WBC 7.3 13.7*  NEUTROABS  --  12.2*  HGB 10.6* 9.5*  HCT 33.3* 30.3*  MCV 100.3* 99.3  PLT 257 119   Basic Metabolic Panel: Recent Labs  Lab 01/27/19 0525 01/28/19  0330 01/29/19 0855 01/31/19 0355 02/02/19 1538  NA 136 135 135 133* 137  K 3.7 3.2* 4.1 3.6 3.2*  CL 110 110 113* 108 114*  CO2 17* 18* 14* 15* 13*  GLUCOSE 69* 87 98 75 85  BUN _0 CREATININE 0.70 0.74 0.75 0.81 0.82  CALCIUM 7.7* 7.5* 7.6*  7.9* 7.8*  MG 2.1  --  2.1  --   --    GFR: Estimated Creatinine Clearance: 40.6 mL/min (by C-G formula based on SCr of 0.82 mg/dL). Liver Function Tests: Recent Labs  Lab 01/27/19 0525 01/28/19 0330 01/29/19 0855 01/31/19 0355 02/02/19 1538  AST 47* 44* 51* 39 41  ALT 43 37 34 27 26  ALKPHOS 186* 168* 159* 161* 158*  BILITOT 0.8 0.4 1.1 1.0 1.0  PROT 5.4* 5.2* 5.8* 5.9* 6.5  ALBUMIN 1.9* 1.8* 1.9* 2.0* 2.3*   No results for input(s): LIPASE, AMYLASE in the last 168 hours. No results for input(s): AMMONIA in the last 168 hours. Coagulation Profile: No results for input(s): INR, PROTIME in the last 168 hours. Cardiac Enzymes: Recent Labs  Lab 01/28/19 0330  CKTOTAL 72   BNP (last 3 results) No results for input(s): PROBNP in the last 8760 hours. HbA1C: No results for input(s): HGBA1C in the last 72 hours. CBG: Recent Labs  Lab 01/29/19 1339 01/30/19 0811 01/30/19 0910 01/31/19 0800 01/31/19 1116  GLUCAP 107* 67* 110* 57* 107*   Lipid Profile: No results for input(s): CHOL, HDL, LDLCALC, TRIG, CHOLHDL, LDLDIRECT in the last 72 hours. Thyroid Function Tests: No results for input(s): TSH, T4TOTAL, FREET4, T3FREE, THYROIDAB in the last 72 hours. Anemia Panel: No results for input(s): VITAMINB12, FOLATE, FERRITIN, TIBC, IRON, RETICCTPCT in the last 72 hours. Urine analysis:    Component Value Date/Time   COLORURINE YELLOW 01/08/2019 2015   APPEARANCEUR HAZY (A) 01/08/2019 2015   LABSPEC 1.018 01/08/2019 2015   PHURINE 6.0 01/08/2019 2015   GLUCOSEU NEGATIVE 01/08/2019 2015   HGBUR SMALL (A) 01/08/2019 2015   BILIRUBINUR NEGATIVE 01/08/2019 2015   KETONESUR NEGATIVE 01/08/2019 2015   PROTEINUR NEGATIVE  01/08/2019 2015   UROBILINOGEN 0.2 02-13-2013 1048   NITRITE NEGATIVE 01/08/2019 2015   LEUKOCYTESUR NEGATIVE 01/08/2019 2015    Radiological Exams on Admission: Ct Pelvis W Contrast  Result Date: 02/02/2019 CLINICAL DATA:  Sacral decubitus ulcer. EXAM: CT PELVIS WITH CONTRAST TECHNIQUE: Multidetector CT imaging of the pelvis was performed using the standard protocol following the bolus administration of intravenous contrast. CONTRAST:  54m OMNIPAQUE IOHEXOL 300 MG/ML  SOLN COMPARISON:  05/02/2018 FINDINGS: Urinary Tract:  No abnormality visualized. Bowel:  Unremarkable visualized pelvic bowel loops. Vascular/Lymphatic: Atheromatous arterial calcifications without aneurysm. No enlarged lymph nodes. Reproductive: Small uterus containing multiple small, densely calcified fibroids. No adnexal mass. Other: Small superficial soft tissue defect posteriorly to the left of the coccyx. This does not extend to the bone and there is no bone destruction or periosteal reaction. Slightly more inferiorly, there is a very small fluid and gas collection, measuring 1.0 x 0.4 cm on image number 35 series 2 and 1.9 cm in length on coronal image number 78. Also noted is diffuse subcutaneous edema. The previously demonstrated bilateral inguinal hernias are no longer seen. Musculoskeletal: No bone destruction or periosteal reaction. Lower lumbar spine degenerative changes. IMPRESSION: 1. Small soft tissue ulcer posteriorly to the left of the coccyx with a 1.9 x 1.0 x 0.4 cm abscess slightly more inferiorly. 2. No evidence of osteomyelitis. 3. Diffuse subcutaneous edema. Electronically Signed   By: SClaudie ReveringM.D.   On: 02/02/2019 17:13   Dg Chest Port 1 View  Result Date: 02/02/2019 CLINICAL DATA:  Cough and tachypnea with tachycardia. EXAM: PORTABLE CHEST 1 VIEW COMPARISON:  01/23/2019 FINDINGS: Patient slightly rotated to the left. Lungs are adequately inflated demonstrate hazy bilateral perihilar opacification likely  mild interstitial edema. Suggestion of small amount of bilateral pleural fluid. Left effusion improved compared to previous exams. Mild stable cardiomegaly. Remainder of the exam is unchanged. IMPRESSION: Evidence of mild interstitial edema and small bilateral pleural effusions with significant interval improvement in the left effusion. Central lung infection is possible but less likely. Mild stable cardiomegaly. Electronically Signed   By: Marin Olp M.D.   On: 02/02/2019 16:04    EKG: Independently reviewed.   Assessment/Plan  Failure to thrive/deconditioning/dehydration -Patient had extensive work-up without any findings with numerous specialist at last admission. Reportedely felt that patient has met maximal benefit during last hospital stay. Daughter declined NG or PEG tube and refused hospice at discharge. Ethics committee was noted to be involved. See last d/c note for full details.  - pt is tachycardiac but likely secondary to dehydration. No fever. Does not feel she warrants any antibiotics at this time especially with complicated C.diff history.  - consult dietician - SLP for swallow study- pt high risk for aspiration  -PT/OT - continue Megace  Hypokalemia  - replete with one time IV potassium -follow BMP  Sacral decubitus ulcer stage IV now with abscess -Continue local wound care-appreciate input from wound care RN -Daughter does not want her to receive any narcotics or opioids for pain. -will continue with Tylenol as needed -surgery evaluated and will proceed with incision and drainage in the morning  Oral thrush -Nystatin suspension  Anemia Hemoglobin of 9.5. Stable with no signs of bleeding. Continue to monitor CBC   HTN -stable without antihypertensive  History of C. difficile colitis s/p stool transplantation -Continues to have loose stool and daughter endorse increased frequency in the past few days.  However, she does not have a fever or abdominal pain to  suggest recurrent C. difficile colitis -Infectious disease recommendation at last admission was to continue Imodium as needed  History of TIA -Continue aspirin and Plavix     DVT prophylaxis:SCDs Code Status: Full- daughter declined further discussion regarding code status Family Communication: Plan discussed with patient and daughter at bedside   Daughter would like updates Sydney [name] [phone]  disposition Plan: Home with at least 2 midnight stays  Consults called:  Admission status: inpatient   Caitland Porchia T Miliani Deike DO [facility]    If 7PM-7AM, please contact night-coverage [email or web address] Password [facility]  02/02/2019, 8:51 PM

## 2019-02-03 ENCOUNTER — Encounter (HOSPITAL_COMMUNITY): Payer: Self-pay | Admitting: *Deleted

## 2019-02-03 ENCOUNTER — Inpatient Hospital Stay (HOSPITAL_COMMUNITY): Payer: Medicare Other

## 2019-02-03 DIAGNOSIS — R5381 Other malaise: Secondary | ICD-10-CM

## 2019-02-03 DIAGNOSIS — Z885 Allergy status to narcotic agent status: Secondary | ICD-10-CM

## 2019-02-03 DIAGNOSIS — Z91018 Allergy to other foods: Secondary | ICD-10-CM

## 2019-02-03 DIAGNOSIS — R062 Wheezing: Secondary | ICD-10-CM

## 2019-02-03 DIAGNOSIS — L89159 Pressure ulcer of sacral region, unspecified stage: Secondary | ICD-10-CM

## 2019-02-03 DIAGNOSIS — R0602 Shortness of breath: Secondary | ICD-10-CM

## 2019-02-03 DIAGNOSIS — Z881 Allergy status to other antibiotic agents status: Secondary | ICD-10-CM

## 2019-02-03 DIAGNOSIS — Z8719 Personal history of other diseases of the digestive system: Secondary | ICD-10-CM

## 2019-02-03 LAB — BASIC METABOLIC PANEL
Anion gap: 14 (ref 5–15)
BUN: 18 mg/dL (ref 8–23)
CO2: 13 mmol/L — ABNORMAL LOW (ref 22–32)
Calcium: 8.1 mg/dL — ABNORMAL LOW (ref 8.9–10.3)
Chloride: 113 mmol/L — ABNORMAL HIGH (ref 98–111)
Creatinine, Ser: 0.83 mg/dL (ref 0.44–1.00)
GFR calc Af Amer: >60 mL/min (ref >60)
GFR calc non Af Amer: >60 mL/min (ref >60)
Glucose, Bld: 67 mg/dL — ABNORMAL LOW (ref 70–99)
Potassium: 3.4 mmol/L — ABNORMAL LOW (ref 3.5–5.1)
Sodium: 140 mmol/L (ref 135–145)

## 2019-02-03 LAB — HEPATIC FUNCTION PANEL
ALT: 25 U/L (ref 0–44)
AST: 39 U/L (ref 15–41)
Albumin: 2.3 g/dL — ABNORMAL LOW (ref 3.5–5.0)
Alkaline Phosphatase: 144 U/L — ABNORMAL HIGH (ref 38–126)
Bilirubin, Direct: 0.4 mg/dL — ABNORMAL HIGH (ref 0.0–0.2)
Indirect Bilirubin: 0.5 mg/dL (ref 0.3–0.9)
Total Bilirubin: 0.9 mg/dL (ref 0.3–1.2)
Total Protein: 6.3 g/dL — ABNORMAL LOW (ref 6.5–8.1)

## 2019-02-03 LAB — CBC
HCT: 35.8 % — ABNORMAL LOW (ref 36.0–46.0)
Hemoglobin: 11.7 g/dL — ABNORMAL LOW (ref 12.0–15.0)
MCH: 31.5 pg (ref 26.0–34.0)
MCHC: 32.7 g/dL (ref 30.0–36.0)
MCV: 96.2 fL (ref 80.0–100.0)
Platelets: 193 10*3/uL (ref 150–400)
RBC: 3.72 MIL/uL — ABNORMAL LOW (ref 3.87–5.11)
RDW: 17.8 % — ABNORMAL HIGH (ref 11.5–15.5)
WBC: 17.8 10*3/uL — ABNORMAL HIGH (ref 4.0–10.5)
nRBC: 0 % (ref 0.0–0.2)

## 2019-02-03 LAB — PHOSPHORUS: Phosphorus: 3.4 mg/dL (ref 2.5–4.6)

## 2019-02-03 LAB — SARS CORONAVIRUS 2 (TAT 6-24 HRS): SARS Coronavirus 2: NEGATIVE

## 2019-02-03 LAB — MAGNESIUM: Magnesium: 1.9 mg/dL (ref 1.7–2.4)

## 2019-02-03 MED ORDER — GERHARDT'S BUTT CREAM
1.0000 "application " | TOPICAL_CREAM | Freq: Three times a day (TID) | CUTANEOUS | Status: DC
  Administered 2019-02-03 – 2019-02-13 (×21): 1 via TOPICAL
  Filled 2019-02-03: qty 1

## 2019-02-03 MED ORDER — METHYLPREDNISOLONE SODIUM SUCC 40 MG IJ SOLR
40.0000 mg | Freq: Once | INTRAMUSCULAR | Status: AC
  Administered 2019-02-03: 16:00:00 40 mg via INTRAVENOUS
  Filled 2019-02-03: qty 1

## 2019-02-03 MED ORDER — POTASSIUM CHLORIDE 10 MEQ/100ML IV SOLN
10.0000 meq | Freq: Once | INTRAVENOUS | Status: AC
  Administered 2019-02-03: 02:00:00 10 meq via INTRAVENOUS
  Filled 2019-02-03: qty 100

## 2019-02-03 MED ORDER — GUAIFENESIN ER 600 MG PO TB12
1200.0000 mg | ORAL_TABLET | Freq: Two times a day (BID) | ORAL | Status: DC
  Administered 2019-02-05 – 2019-02-10 (×5): 1200 mg via ORAL
  Filled 2019-02-03 (×12): qty 2

## 2019-02-03 MED ORDER — MEGESTROL ACETATE 400 MG/10ML PO SUSP
400.0000 mg | Freq: Three times a day (TID) | ORAL | Status: DC
  Administered 2019-02-07 – 2019-02-10 (×5): 400 mg via ORAL
  Filled 2019-02-03 (×33): qty 10

## 2019-02-03 MED ORDER — CEFAZOLIN SODIUM-DEXTROSE 1-4 GM/50ML-% IV SOLN
1.0000 g | Freq: Three times a day (TID) | INTRAVENOUS | Status: DC
  Administered 2019-02-03 – 2019-02-06 (×8): 1 g via INTRAVENOUS
  Filled 2019-02-03 (×12): qty 50

## 2019-02-03 MED ORDER — ADULT MULTIVITAMIN W/MINERALS CH
1.0000 | ORAL_TABLET | Freq: Every day | ORAL | Status: DC
  Administered 2019-02-08: 10:00:00 1 via ORAL
  Filled 2019-02-03 (×4): qty 1

## 2019-02-03 MED ORDER — ENSURE ENLIVE PO LIQD
237.0000 mL | Freq: Two times a day (BID) | ORAL | Status: DC
  Administered 2019-02-03 – 2019-02-13 (×4): 237 mL via ORAL

## 2019-02-03 MED ORDER — ACETAMINOPHEN 650 MG RE SUPP
650.0000 mg | Freq: Once | RECTAL | Status: AC
  Administered 2019-02-04: 01:00:00 650 mg via RECTAL
  Filled 2019-02-03: qty 1

## 2019-02-03 MED ORDER — FOLIC ACID 1 MG PO TABS
1.0000 mg | ORAL_TABLET | Freq: Every day | ORAL | Status: DC
  Administered 2019-02-05 – 2019-02-08 (×2): 1 mg via ORAL
  Filled 2019-02-03 (×6): qty 1

## 2019-02-03 MED ORDER — LATANOPROST 0.005 % OP SOLN
1.0000 [drp] | Freq: Every day | OPHTHALMIC | Status: DC
  Administered 2019-02-07 – 2019-02-12 (×5): 1 [drp] via OPHTHALMIC
  Filled 2019-02-03: qty 2.5

## 2019-02-03 MED ORDER — JUVEN PO PACK
1.0000 | PACK | Freq: Two times a day (BID) | ORAL | Status: DC
  Administered 2019-02-07 – 2019-02-10 (×4): 1 via ORAL
  Filled 2019-02-03 (×22): qty 1

## 2019-02-03 MED ORDER — ASPIRIN EC 81 MG PO TBEC
81.0000 mg | DELAYED_RELEASE_TABLET | Freq: Every day | ORAL | Status: DC
  Administered 2019-02-05 – 2019-02-08 (×2): 81 mg via ORAL
  Filled 2019-02-03 (×6): qty 1

## 2019-02-03 MED ORDER — FUROSEMIDE 10 MG/ML IJ SOLN
20.0000 mg | Freq: Once | INTRAMUSCULAR | Status: AC
  Administered 2019-02-03: 20:00:00 20 mg via INTRAVENOUS
  Filled 2019-02-03: qty 2

## 2019-02-03 MED ORDER — SODIUM CHLORIDE 0.9 % IV SOLN
INTRAVENOUS | Status: DC
  Administered 2019-02-03: 02:00:00 via INTRAVENOUS

## 2019-02-03 MED ORDER — POTASSIUM CHLORIDE 10 MEQ/100ML IV SOLN
INTRAVENOUS | Status: AC
  Administered 2019-02-03: 10 meq
  Filled 2019-02-03: qty 100

## 2019-02-03 MED ORDER — DEXTROSE 5 % IV SOLN: INTRAVENOUS | Status: DC

## 2019-02-03 MED ORDER — SODIUM BICARBONATE-DEXTROSE 150-5 MEQ/L-% IV SOLN
150.0000 meq | INTRAVENOUS | Status: DC
  Administered 2019-02-03: 150 meq via INTRAVENOUS
  Filled 2019-02-03 (×2): qty 1000

## 2019-02-03 MED ORDER — FUROSEMIDE 10 MG/ML IJ SOLN
20.0000 mg | Freq: Once | INTRAMUSCULAR | Status: AC
  Administered 2019-02-03: 20 mg via INTRAVENOUS
  Filled 2019-02-03: qty 2

## 2019-02-03 MED ORDER — LEVALBUTEROL HCL 0.63 MG/3ML IN NEBU
0.6300 mg | INHALATION_SOLUTION | Freq: Four times a day (QID) | RESPIRATORY_TRACT | Status: DC
  Administered 2019-02-04: 14:00:00 0.63 mg via RESPIRATORY_TRACT
  Filled 2019-02-03 (×5): qty 3

## 2019-02-03 MED ORDER — PANTOPRAZOLE SODIUM 20 MG PO TBEC
20.0000 mg | DELAYED_RELEASE_TABLET | Freq: Every day | ORAL | Status: DC
  Administered 2019-02-08: 20 mg via ORAL
  Filled 2019-02-03 (×6): qty 1

## 2019-02-03 MED ORDER — MIRTAZAPINE 30 MG PO TBDP
45.0000 mg | ORAL_TABLET | Freq: Every day | ORAL | Status: DC
  Filled 2019-02-03 (×6): qty 1

## 2019-02-03 MED ORDER — POTASSIUM CHLORIDE 10 MEQ/100ML IV SOLN
INTRAVENOUS | Status: AC
  Administered 2019-02-03: 19:00:00
  Filled 2019-02-03: qty 100

## 2019-02-03 MED ORDER — IPRATROPIUM BROMIDE 0.02 % IN SOLN
0.5000 mg | Freq: Four times a day (QID) | RESPIRATORY_TRACT | Status: DC
  Administered 2019-02-04: 14:00:00 0.5 mg via RESPIRATORY_TRACT
  Filled 2019-02-03 (×2): qty 2.5

## 2019-02-03 MED ORDER — SODIUM BICARBONATE-DEXTROSE 150-5 MEQ/L-% IV SOLN
150.0000 meq | INTRAVENOUS | Status: DC
  Administered 2019-02-03 – 2019-02-04 (×2): 150 meq via INTRAVENOUS
  Filled 2019-02-03 (×2): qty 1000

## 2019-02-03 MED ORDER — LOPERAMIDE HCL 2 MG PO CAPS: 2.0000 mg | ORAL_CAPSULE | ORAL | Status: DC | PRN

## 2019-02-03 MED ORDER — NYSTATIN 100000 UNIT/GM EX CREA
TOPICAL_CREAM | Freq: Two times a day (BID) | CUTANEOUS | Status: DC
  Administered 2019-02-03 – 2019-02-08 (×5): via TOPICAL
  Administered 2019-02-09: 1 via TOPICAL
  Administered 2019-02-09 – 2019-02-10 (×3): via TOPICAL
  Filled 2019-02-03 (×2): qty 15

## 2019-02-03 MED ORDER — CLOPIDOGREL BISULFATE 75 MG PO TABS
75.0000 mg | ORAL_TABLET | Freq: Every day | ORAL | Status: DC
  Administered 2019-02-05 – 2019-02-13 (×3): 75 mg via ORAL
  Filled 2019-02-03 (×7): qty 1

## 2019-02-03 MED ORDER — POTASSIUM CHLORIDE 10 MEQ/100ML IV SOLN
10.0000 meq | INTRAVENOUS | Status: AC
  Administered 2019-02-03 (×3): 10 meq via INTRAVENOUS
  Filled 2019-02-03: qty 100

## 2019-02-03 NOTE — Progress Notes (Signed)
Pharmacy Antibiotic Note  Sydney [name] is a 85 y.o. female admitted on 02/02/2019 with sacral ulcer.  Pharmacy has been consulted for ancef dosing. Pt with sacral abscess, no associated osteomyelitis.   Plan: Ancef 1 gm IV q8h Pharmacy to sign off  Height: 5\' 6"  (167.6 cm) Weight: 115 lb (52.2 kg) IBW/kg (Calculated) : 59.3  Temp (24hrs), Avg:98.4 F (36.9 C), Min:98.1 F (36.7 C), Max:98.9 F (37.2 C)  Recent Labs  Lab 01/28/19 0330 01/29/19 0855 01/29/19 1503 01/31/19 0355 02/02/19 1538 02/02/19 2005 02/03/19 0257  WBC  --   --  7.3  --  13.7*  --  17.8*  CREATININE 0.74 0.75  --  0.81 0.82  --  0.83  LATICACIDVEN  --   --   --   --   --  1.5  --     Estimated Creatinine Clearance: 40.1 mL/min (by C-G formula based on SCr of 0.83 mg/dL).    Allergies  Allergen Reactions  . Demerol [Meperidine] Other (See Comments)    Excessive sweating, cramping  . Other     No Antibiotic without consultation with her primary physician.  . Codeine Rash  . Sulfa Antibiotics Itching and Other (See Comments)    Reaction unknown  . Tomato Itching    Thank you for allowing pharmacy to be a part of this patient's care.  Eudelia Bunch, Pharm.D [phone] 02/03/2019 2:50 PM

## 2019-02-03 NOTE — Progress Notes (Signed)
Patient transferred to 1420. Report Given to Nurse [employer]

## 2019-02-03 NOTE — Progress Notes (Signed)
Patient refusing IV team RN to start an IV after first attempt. RN talked to patient and she is saying she is tired, needs to eat, and we can stick her tomorrow. RN will follow up and re-consult when patient ready for IV start.

## 2019-02-03 NOTE — Progress Notes (Signed)
Dr. Alfredia Ferguson notified of patient's MEWS yellow. Patient LOC. Patient is alert oriented to person and pain. No new orders at this time

## 2019-02-03 NOTE — Consult Note (Addendum)
[facility]       Reason for Consult: abscess    Referring Physician: Dr. Alfredia Ferguson  Active Problems:   Sacral ulcer ([place])   . aspirin EC  81 mg Oral Daily  . clopidogrel  75 mg Oral Daily  . folic acid  1 mg Oral Daily  . Gerhardt's butt cream  1 application Topical TID  . guaiFENesin  1,200 mg Oral BID  . ipratropium  0.5 mg Nebulization Q6H  . latanoprost  1 drop Both Eyes QHS  . levalbuterol  0.63 mg Nebulization Q6H  . megestrol  400 mg Oral TID  . mirtazapine  45 mg Oral QHS  . nystatin  5 mL Oral QID  . nystatin cream   Topical BID  . pantoprazole  20 mg Oral Daily    Recommendations:  cefazolin  Assessment: She has a history of C diff and fecal transplant and now with a sacral abscess.  No associated osteomyelitis.  I will start her on empiric cefazolin pending culture growth already sent by surgery.  She will not need a prolonged course nor IV at discharge.  But with poor po and potential swallowing issues, will use IV therapy for now.   Antibiotics: none  HPI: Sydney [name] is a 85 y.o. female chronically ill and history of C diff, FTT with sacral decubitus ulcers here with wheezing and concerns with her wound.  She was just discharged 10/6 after a prolonged hospitalization and back with above complaints.  Her WBC is 17.8, no fever.  CT with abscess and draining on its own.  Seen by surgery and hydrotherapy and wound care ordered.  The patients daughter is at the bedside.     Review of Systems:  Constitutional: negative for fevers and chills Gastrointestinal: negative for nausea Hematologic/lymphatic: negative for lymphadenopathy All other systems reviewed and are negative    Past Medical History:  Diagnosis Date  . Anemia    years ago  . Aortic atherosclerosis ([place])   . Arthritis    "left hand" (02/17/2013)  . Bilateral inguinal hernia    INCIDENTAL CT 1/20  . C. difficile diarrhea   . Cataract   . Dehydration  12/29/2018  . Diplopia    CRANIAL 6 NERVE PALSY  . Dizzy   . Dyslipidemia   . Gastritis   . Glaucoma   . High cholesterol    "on RX years ago" (02/17/2013)  . Hypertension   . Leukopenia   . Meningioma ([place]) 05/01/2015  . Osteoporosis 02/2014   T score -2.5  followed by Dr. Lysle Rubens  . Palpitations    PACs, PVCs and short runs of atrial tachycardia on Holter monitoring  . PVD (peripheral vascular disease) ([place])   . Rhinitis   . Syncopal episodes 2003  . TIA (transient ischemic attack) 1990's  . Trigger thumb of left hand   . Weakness 12/29/2018  . Winter itch     Social History   Tobacco Use  . Smoking status: Never Smoker  . Smokeless tobacco: Never Used  Substance Use Topics  . Alcohol use: No  . Drug use: No    Family History  Problem Relation Age of Onset  . Hypertension Father   . Heart attack Father   . Stroke Brother   . Stroke Maternal Grandmother     Allergies  Allergen Reactions  . Demerol [Meperidine] Other (See Comments)    Excessive sweating, cramping  . Other  No Antibiotic without consultation with her primary physician.  . Codeine Rash  . Sulfa Antibiotics Itching and Other (See Comments)    Reaction unknown  . Tomato Itching    Physical Exam: Constitutional: in no apparent distress, frail, chronically ill appearing  Vitals:   02/02/19 2151 02/03/19 0613  BP: 139/81 137/80  Pulse: (!) 108 97  Resp: 18 15  Temp: 98.1 F (36.7 C) 98.9 F (37.2 C)  SpO2: 99% 98%   EYES: anicteric Cardiovascular: Cor RRR Respiratory: CTA B; normal respiratory effort GI: Bowel sounds are normal Musculoskeletal: no pedal edema noted Skin: negatives: no rash Hematologic: no cervical lad  Lab Results  Component Value Date   WBC 17.8 (H) 02/03/2019   HGB 11.7 (L) 02/03/2019   HCT 35.8 (L) 02/03/2019   MCV 96.2 02/03/2019   PLT 193 02/03/2019    Lab Results  Component Value Date   CREATININE 0.83 02/03/2019   BUN 18 02/03/2019   NA 140  02/03/2019   K 3.4 (L) 02/03/2019   CL 113 (H) 02/03/2019   CO2 13 (L) 02/03/2019    Lab Results  Component Value Date   ALT 25 02/03/2019   AST 39 02/03/2019   ALKPHOS 144 (H) 02/03/2019     Microbiology: Recent Results (from the past 240 hour(s))  SARS CORONAVIRUS 2 (TAT 6-24 HRS) Nasopharyngeal Nasopharyngeal Swab     Status: None   Collection Time: 02/02/19  6:43 PM   Specimen: Nasopharyngeal Swab  Result Value Ref Range Status   SARS Coronavirus 2 NEGATIVE NEGATIVE Final    Comment: (NOTE) SARS-CoV-2 target nucleic acids are NOT DETECTED. The SARS-CoV-2 RNA is generally detectable in upper and lower respiratory specimens during the acute phase of infection. Negative results do not preclude SARS-CoV-2 infection, do not rule out co-infections with other pathogens, and should not be used as the sole basis for treatment or other patient management decisions. Negative results must be combined with clinical observations, patient history, and epidemiological information. The expected result is Negative. Fact Sheet for Patients: SugarRoll.be Fact Sheet for Healthcare Providers: https://www.woods-mathews.com/ This test is not yet approved or cleared by the [place] FDA and  has been authorized for detection and/or diagnosis of SARS-CoV-2 by FDA under an Emergency Use Authorization (EUA). This EUA will remain  in effect (meaning this test can be used) for the duration of the COVID-19 declaration under Section 56 4(b)(1) of the Act, 21 U.S.C. section 360bbb-3(b)(1), unless the authorization is terminated or revoked sooner. Performed at [facility], [place] [address], [place], [place] [address]     Lanard Arguijo W Malory Spurr, [place] [facility] [email or web address] 02/03/2019, 1:47 PM

## 2019-02-03 NOTE — Progress Notes (Addendum)
Patient arrived to unit. Daughter at bedside. Pt assessed. Wound assessed under foam. Skin dry over all. Pt has congested cough. RT arrived to assist. Pt refused flutter valve at this time. Daughter request RT to come back at a later time. Flutter valve education provided. MEWS red due to respirations and elevated HR. Will reassess.Hospitalist Provider updated.

## 2019-02-03 NOTE — Progress Notes (Signed)
Initial Nutrition Assessment  DOCUMENTATION CODES:   Severe malnutrition in context of chronic illness  INTERVENTION:  - will order Ensure Enlive BID, each supplement provides 350 kcal and 20 grams of protein. - will order Magic Cup BID with meals, each supplement provides 290 kcal and 9 grams of protein  - will order Juven BID, each packet provides 95 calories, 2.5 grams of protein (collagen), and 9.8 grams of carbohydrate (3 grams sugar); also contains 7 grams of L-arginine and L-glutamine, 300 mg vitamin C, 15 mg vitamin E, 1.2 mcg vitamin B-12, 9.5 mg zinc, 200 mg calcium, and 1.5 g  Calcium Beta-hydroxy-Beta-methylbutyrate to support wound healing.  - floor staff to assist with feeding as needed if daughter is not present.    NUTRITION DIAGNOSIS:   Severe Malnutrition related to chronic illness(TIA, prolonged hospitalization) as evidenced by severe fat depletion, severe muscle depletion, moderate fat depletion, moderate muscle depletion.  GOAL:   Patient will meet greater than or equal to 90% of their needs  MONITOR:   PO intake, Supplement acceptance, Labs, Weight trends, Other (Comment)(GOC)  REASON FOR ASSESSMENT:   Consult Assessment of nutrition requirement/status, Poor PO  ASSESSMENT:   85 y.o. female with medical history significant of C. difficile colitis s/p stool transplant, HTN, FTT, and TIA. She presented to the ED on 10/8 due to concerns of wheezing and sacral wound. She was hospitalized 8/24-10/6 due to progressive weakness, malaise, fatigue, and significant FTT. She developed encephalopathy during hospitalization. During that admission, she was seen by ID, GI, Oncology, and Neurology but had no significant improvements in FTT. She was discharged home with home health as daughter was reluctant about SNF and hospice care. In the ED, CXR showed evidence of mild interstitial edema and small bilateral pleural effusion with significant interval improvement in the left  effusion.  Central lung infection is possible but less likely. Mild, stable cardiomegaly. CT pelvis showed small soft tissue ulcer posterior to the left of the coccyx with an abscess but no evidence of osteomyelitis.  Per flow sheet, patient consumed 0% of breakfast and lunch today. She was previously at [facility] for ~1.5 months and then was discharged home with home health on 10/6 and returned to the ED on 10/8. Patient unable to provide much relevant history. Call placed to her daughter, [name], who is planning to call RD back later today.   This RD able to briefly talk with RD who primarily saw patient during extended hospitalization at [facility]. RD reported that patient was eating very minimally during hospitalization and had nutritional decline which was evidenced by worsening muscle and fat wasting during hospitalization. A Cortrak was placed and subsequently needed to be replaced several times due to patient pulling out tube. During hospitalization, IR had deemed patient not to be a PEG candidate. Throughout previous hospitalization and currently, daughter prefers for patient to remain full code and for aggressive interventions to be done, if needed.   Per chart review, current weight is 115 lb. Weight on 8/14 was 100 lb and weight on 9/30 was 133 lb. Suspect that part of weight gain was d/t fluid given severity of muscle and fat wasting noted during NFPE.  Megace currently ordered. This medication was started on 8/27 at [facility].   WOC saw patient today and does not plan to follow. Stated wound is 3 cm x 2 cm x 0.3 cm and tunneling that extends 3 cm. SLP attempted to see patient earlier today but unable to d/t PT/OT  working with patient at that time; will monitor for recommendations. Surgery team saw patient earlier today and there is no plan for further surgery/debridement to wound at this time.   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 Medications reviewed; 1 mg folvite/day, 20 mg IV lasix x1 dose 10/9, PRN imodium, 400 mg megace TID, 40 mg IV ssolu-medrol x1 dose 10/9, 5 ml mycostatin QID, 20 mg oral protonix/day, 10 mEq IV KCl x1 run 10/9. IVF; D5-150 mEq sodium bicarb @ 50 ml/hr (204 kcal).      NUTRITION - FOCUSED PHYSICAL EXAM:    Most Recent Value  Orbital Region  Severe depletion  Upper Arm Region  Severe depletion  Thoracic and Lumbar Region  Unable to assess  Buccal Region  Severe depletion  Temple Region  Severe depletion  Clavicle Bone Region  Severe depletion  Clavicle and Acromion Bone Region  Severe depletion  Scapular Bone Region  Unable to assess  Dorsal Hand  Severe depletion  Patellar Region  Severe depletion  Anterior Thigh Region  Severe depletion  Posterior Calf Region  Severe depletion  Edema (RD Assessment)  None  Hair  Reviewed  Eyes  Reviewed  Mouth  Reviewed  Skin  Reviewed  Nails  Reviewed       Diet Order:   Diet Order            Diet NPO time specified  Diet effective midnight        DIET DYS 3 Room service appropriate? Yes; Fluid consistency: Thin  Diet effective now              EDUCATION NEEDS:   Not appropriate for education at this time  Skin:  Skin Integrity Issues:: Unstageable Stage IV: L buttocks  Last BM:  10/8  Height:   Ht Readings from Last 1 Encounters:  02/02/19 '5\' 6"'  (1.676 m)    Weight:   Wt Readings from Last 1 Encounters:  02/02/19 52.2 kg    Ideal Body Weight:  59.1 kg  BMI:  Body  mass index is 18.56 kg/m.  Estimated Nutritional Needs:   Kcal:  1800-2000 kcal  Protein:  85-95 grams  Fluid:  >/= 1.8 L/day     Jarome Matin, MS, RD, LDN, [facility] Inpatient Clinical Dietitian Pager # [phone] After hours/weekend pager # [phone]

## 2019-02-03 NOTE — Consult Note (Signed)
[facility] Nurse wound consult note Reason for Consult:Nonhealing abscess to sacrum.  Full thickness injury.  Draining purulence at this time. Has been evaluated by surgical services and infectious disease.  Wound type:infectious Pressure Injury POA: Yes Measurement: 3 cm x 2 cm x x 0.3 cm with tunneling extends 3 cm  Wound [id number] pink with purulence present Drainage (amount, consistency, odor) moderate purulence  History C diff with fecal transplant Periwound:intact Dressing procedure/placement/frequency: Cleanse sacral wound with NS and pat dry.  Fill wound depth with iodoform gauze. Cover with silicone foam.  Change daily.  Will not follow at this time.  Please re-consult if needed.  Domenic Moras MSN, RN, FNP-BC CWON Wound, Ostomy, Continence Nurse Pager [phone]

## 2019-02-03 NOTE — Progress Notes (Signed)
Notified Dr. Alfredia Ferguson of patient's heart rate and oxygen saturations. Oxygen applied @ 2L via n/c. Patient's oxygen saturations came up to 100. Respiratory therapist also paged

## 2019-02-03 NOTE — Consult Note (Signed)
Sydney [name] 1930-10-31  [id number].    Requesting MD: Dr. Alfredia Ferguson Chief Complaint/Reason for Consult: Sacral Wound   HPI:  Sydney [name] is 85 y.o. female who we were consulted for to evaluate for a sacral wound. Patient was recently discharged from [facility] on 10/6 after a 43 day admission for failure to thrive.  History obtained chart review and patient's daughter who is at bedside. Patient is not able to hold a conversation with me to answer questions. During prior admission, no etiology was found for patient's failure to thrive.  It appears palliative care/hospice was involved in discussions for SNF versus home with hospice care but was deferred.  Patient was discharged with home health.  Apparently patient presented to the ED yesterday because her home health nurse was concerned for infected sacral wound as well as possible aspiration.  In the ED patient was found to have elevated WBC that is now 17.8 today.  A CT of the sacrum was done that showed a small soft tissue ulcer posteriorly to the left of the coccyx with a 1.9 x 1.0 x 0.4 cm abscess inferiorly.  General surgery was consulted for evaluation.  Patient's daughter wished for no antibiotics were started until ID was consulted.  The hospitalist has consulted ID.  The wound was evaluated by WOCN during last admission.  It appears at that time the wound was 2 cm x 1.8 cm x 1.8 cm with the greatest undermining at 6:00 with 2.1 cm.  At that time there was some brown drainage.  Patient has been having ongoing diarrhea.  She has a history of C. difficile in the past for stool replacement several years ago.  Daughter states that the sacral wound was present for a few months prior to admission back in August.  She notes it is continued to drain a foul odor over the last several days but she is unsure what it looks like.   ROS: ROS answered by patients daughter Review of Systems  Constitutional: Negative for chills and fever.    Respiratory: Positive for shortness of breath.   Cardiovascular: Negative for chest pain.  Gastrointestinal: Positive for diarrhea. Negative for abdominal pain, nausea and vomiting.  Genitourinary:       Sacral wound  All other systems reviewed and are negative.   Family History  Problem Relation Age of Onset   Hypertension Father    Heart attack Father    Stroke Brother    Stroke Maternal Grandmother     Past Medical History:  Diagnosis Date   Anemia    years ago   Aortic atherosclerosis ([place])    Arthritis    "left hand" (02/17/2013)   Bilateral inguinal hernia    INCIDENTAL CT 1/20   C. difficile diarrhea    Cataract    Dehydration 12/29/2018   Diplopia    CRANIAL 6 NERVE PALSY   Dizzy    Dyslipidemia    Gastritis    Glaucoma    High cholesterol    "on RX years ago" (02/17/2013)   Hypertension    Leukopenia    Meningioma ([place]) 05/01/2015   Osteoporosis 02/2014   T score -2.5  followed by Dr. Lysle Rubens   Palpitations    PACs, PVCs and short runs of atrial tachycardia on Holter monitoring   PVD (peripheral vascular disease) ([place])    Rhinitis    Syncopal episodes 2003   TIA (transient ischemic attack) 1990's   Trigger thumb of left  hand    Weakness 12/29/2018   Winter itch     Past Surgical History:  Procedure Laterality Date   ANAL FISTULECTOMY  1970   COLONOSCOPY     COLONOSCOPY WITH PROPOFOL N/A 12/02/2016   Procedure: COLONOSCOPY WITH PROPOFOL;  Surgeon: Gatha Mayer, MD;  Location: [facility];  Service: Endoscopy;  Laterality: N/A;   FECAL TRANSPLANT N/A 12/02/2016   Procedure: FECAL TRANSPLANT;  Surgeon: Gatha Mayer, MD;  Location[facility] ENDOSCOPY;  Service: Endoscopy;  Laterality: N/A;   TONSILLECTOMY      Social History:  reports that she has never smoked. She has never used smokeless tobacco. She reports that she does not drink alcohol or use drugs.  Allergies:  Allergies  Allergen Reactions   Demerol  [Meperidine] Other (See Comments)    Excessive sweating, cramping   Other     No Antibiotic without consultation with her primary physician.   Codeine Rash   Sulfa Antibiotics Itching and Other (See Comments)    Reaction unknown   Tomato Itching    Medications Prior to Admission  Medication Sig Dispense Refill   aspirin (ECOTRIN LOW STRENGTH) 81 MG EC tablet Take 81 mg by mouth daily. Swallow whole.     cholecalciferol (VITAMIN D) 1000 units tablet Take 1,000 Units by mouth daily.     clopidogrel (PLAVIX) 75 MG tablet Take 75 mg by mouth daily.     folic acid (FOLVITE) 1 MG tablet Take 1 mg by mouth daily.     Hydrocortisone (GERHARDT'S BUTT CREAM) CREA Apply 1 application topically 3 (three) times daily. Apply barrier cream to buttocks/sacrum TID and PRN with each incontinent cleaning session 90 each 0   latanoprost (XALATAN) 0.005 % ophthalmic solution Place 1 drop into both eyes at bedtime.      loperamide (IMODIUM) 2 MG capsule Take 1 capsule (2 mg total) by mouth as needed for diarrhea or loose stools. 30 capsule 0   megestrol (MEGACE) 400 MG/10ML suspension Take 10 mLs (400 mg total) by mouth 3 (three) times daily. 900 mL 0   mirtazapine (REMERON SOL-TAB) 45 MG disintegrating tablet Take 1 tablet (45 mg total) by mouth at bedtime. 30 tablet 0   Multiple Vitamin (MULTIVITAMIN WITH MINERALS) TABS tablet Take 1 tablet by mouth daily. 30 tablet 0   nystatin (MYCOSTATIN) 100000 UNIT/ML suspension Take 5 mLs (500,000 Units total) by mouth 4 (four) times daily as needed for up to 10 days (thrush). 200 mL 0   nystatin (MYCOSTATIN/NYSTOP) powder Apply topically 2 (two) times daily. Apply to affected area on the skin folds 15 g 0   pantoprazole (PROTONIX) 20 MG tablet Take 20 mg by mouth daily.     traMADol (ULTRAM) 50 MG tablet Take 0.5 tablets (25 mg total) by mouth every 12 (twelve) hours as needed for moderate pain. 10 tablet 0   Amino Acids-Protein Hydrolys (FEEDING  SUPPLEMENT, PRO-STAT SUGAR FREE 64,) LIQD Take 30 mLs by mouth 2 (two) times daily with a meal. (Patient not taking: Reported on 02/02/2019) 1800 mL 0     Physical Exam: Blood pressure 137/80, pulse 97, temperature 98.9 F (37.2 C), temperature source Oral, resp. rate 15, height 5\' 6"  (1.676 m), weight 52.2 kg, SpO2 98 %. General: pleasant, frail, appearing female lying on her left side who appears uncomfortable HEENT: head is normocephalic, atraumatic.  Sclera are noninjected. Ears and nose without any masses or lesions.  Mouth is dry Heart: regular, rate, and rhythm.  Lungs: Diffuse expiratory  wheezing with crackles at the bases b/l Abd: soft, NT, ND, +BS, no masses, hernias, or organomegaly MS: Some contractures of extremities. LE pedal edema b/l GU: Chaperone present, RN Navreet. Patient with 2cm x 2cm opening over the sacrum with a 25mm opening approximately 2cm inferior to larger wound. Please see picture below. The wound tracks ~3.8cm inferiorly and the two openings communicate. There is active drainage of purulence with some loculations that were broken up on the inferior aspect of the wound and cultures were obtained. The wound tracks 1cm laterally and superiorly. The periwound appears red and irritated. Patient with Skin: warm and dry with no masses, lesions, or rashes other than mentioned in GU section Psych: Appropriate affect.      Results for orders placed or performed during the hospital encounter of 02/02/19 (from the past 48 hour(s))  Comprehensive metabolic panel     Status: Abnormal   Collection Time: 02/02/19  3:38 PM  Result Value Ref Range   Sodium 137 135 - 145 mmol/L   Potassium 3.2 (L) 3.5 - 5.1 mmol/L   Chloride 114 (H) 98 - 111 mmol/L   CO2 13 (L) 22 - 32 mmol/L   Glucose, Bld 85 70 - 99 mg/dL   BUN 18 8 - 23 mg/dL   Creatinine, Ser 0.82 0.44 - 1.00 mg/dL   Calcium 7.8 (L) 8.9 - 10.3 mg/dL   Total Protein 6.5 6.5 - 8.1 g/dL   Albumin 2.3 (L) 3.5 - 5.0 g/dL     AST 41 15 - 41 U/L   ALT 26 0 - 44 U/L   Alkaline Phosphatase 158 (H) 38 - 126 U/L   Total Bilirubin 1.0 0.3 - 1.2 mg/dL   GFR calc non Af Amer >60 >60 mL/min   GFR calc Af Amer >60 >60 mL/min   Anion gap 10 5 - 15    Comment: Performed at [facility], [place] [address], [place], [place] [address]  CBC with Differential     Status: Abnormal   Collection Time: 02/02/19  3:38 PM  Result Value Ref Range   WBC 13.7 (H) 4.0 - 10.5 K/uL   RBC 3.05 (L) 3.87 - 5.11 MIL/uL   Hemoglobin 9.5 (L) 12.0 - 15.0 g/dL   HCT 30.3 (L) 36.0 - 46.0 %   MCV 99.3 80.0 - 100.0 fL   MCH 31.1 26.0 - 34.0 pg   MCHC 31.4 30.0 - 36.0 g/dL   RDW 18.1 (H) 11.5 - 15.5 %   Platelets 245 150 - 400 K/uL   nRBC 0.0 0.0 - 0.2 %   Neutrophils Relative % 89 %   Neutro Abs 12.2 (H) 1.7 - 7.7 K/uL   Lymphocytes Relative 4 %   Lymphs Abs 0.5 (L) 0.7 - 4.0 K/uL   Monocytes Relative 6 %   Monocytes Absolute 0.8 0.1 - 1.0 K/uL   Eosinophils Relative 0 %   Eosinophils Absolute 0.0 0.0 - 0.5 K/uL   Basophils Relative 0 %   Basophils Absolute 0.0 0.0 - 0.1 K/uL   Immature Granulocytes 1 %   Abs Immature Granulocytes 0.17 (H) 0.00 - 0.07 K/uL    Comment: Performed at [facility], [place] [address], [place], [place] [address]  SARS CORONAVIRUS 2 (TAT 6-24 HRS) Nasopharyngeal Nasopharyngeal Swab     Status: None   Collection Time: 02/02/19  6:43 PM   Specimen: Nasopharyngeal Swab  Result Value Ref Range   SARS Coronavirus 2 NEGATIVE NEGATIVE  Comment: (NOTE) SARS-CoV-2 target nucleic acids are NOT DETECTED. The SARS-CoV-2 RNA is generally detectable in upper and lower respiratory specimens during the acute phase of infection. Negative results do not preclude SARS-CoV-2 infection, do not rule out co-infections with other pathogens, and should not be used as the sole basis for treatment or other patient management decisions. Negative results must be combined with clinical  observations, patient history, and epidemiological information. The expected result is Negative. Fact Sheet for Patients: SugarRoll.be Fact Sheet for Healthcare Providers: https://www.woods-mathews.com/ This test is not yet approved or cleared by the [place] FDA and  has been authorized for detection and/or diagnosis of SARS-CoV-2 by FDA under an Emergency Use Authorization (EUA). This EUA will remain  in effect (meaning this test can be used) for the duration of the COVID-19 declaration under Section 56 4(b)(1) of the Act, 21 U.S.C. section 360bbb-3(b)(1), unless the authorization is terminated or revoked sooner. Performed at [facility], [place] [address] [place], [place] [address]   Lactic acid, plasma     Status: None   Collection Time: 02/02/19  8:05 PM  Result Value Ref Range   Lactic Acid, Venous 1.5 0.5 - 1.9 mmol/L    Comment: Performed at [facility], [place] [address], [place], [place] [id number]  Basic metabolic panel     Status: Abnormal   Collection Time: 02/03/19  2:57 AM  Result Value Ref Range   Sodium 140 135 - 145 mmol/L   Potassium 3.4 (L) 3.5 - 5.1 mmol/L   Chloride 113 (H) 98 - 111 mmol/L   CO2 13 (L) 22 - 32 mmol/L   Glucose, Bld 67 (L) 70 - 99 mg/dL   BUN 18 8 - 23 mg/dL   Creatinine, Ser 0.83 0.44 - 1.00 mg/dL   Calcium 8.1 (L) 8.9 - 10.3 mg/dL   GFR calc non Af Amer >60 >60 mL/min   GFR calc Af Amer >60 >60 mL/min   Anion gap 14 5 - 15    Comment: Performed at [facility], [place] [address], [place], [place] [address]  CBC     Status: Abnormal   Collection Time: 02/03/19  2:57 AM  Result Value Ref Range   WBC 17.8 (H) 4.0 - 10.5 K/uL   RBC 3.72 (L) 3.87 - 5.11 MIL/uL   Hemoglobin 11.7 (L) 12.0 - 15.0 g/dL   HCT 35.8 (L) 36.0 - 46.0 %   MCV 96.2 80.0 - 100.0 fL   MCH 31.5 26.0 - 34.0 pg   MCHC 32.7 30.0 - 36.0 g/dL   RDW 17.8 (H) 11.5 - 15.5 %   Platelets 193  150 - 400 K/uL   nRBC 0.0 0.0 - 0.2 %    Comment: Performed at [facility], [place] [address] [place], [place] [address]  Hepatic function panel     Status: Abnormal   Collection Time: 02/03/19  2:57 AM  Result Value Ref Range   Total Protein 6.3 (L) 6.5 - 8.1 g/dL   Albumin 2.3 (L) 3.5 - 5.0 g/dL   AST 39 15 - 41 U/L   ALT 25 0 - 44 U/L   Alkaline Phosphatase 144 (H) 38 - 126 U/L   Total Bilirubin 0.9 0.3 - 1.2 mg/dL   Bilirubin, Direct 0.4 (H) 0.0 - 0.2 mg/dL   Indirect Bilirubin 0.5 0.3 - 0.9 mg/dL    Comment: Performed at [facility], [place] [address] [place],  [address]  Magnesium  Status: None   Collection Time: 02/03/19  2:57 AM  Result Value Ref Range   Magnesium 1.9 1.7 - 2.4 mg/dL    Comment: Performed at [facility], [place] [address], [place], [place] [address]  Phosphorus     Status: None   Collection Time: 02/03/19  2:57 AM  Result Value Ref Range   Phosphorus 3.4 2.5 - 4.6 mg/dL    Comment: Performed at [facility], [place] [address], [place], [place] [address]   Ct Pelvis W Contrast  Result Date: 02/02/2019 CLINICAL DATA:  Sacral decubitus ulcer. EXAM: CT PELVIS WITH CONTRAST TECHNIQUE: Multidetector CT imaging of the pelvis was performed using the standard protocol following the bolus administration of intravenous contrast. CONTRAST:  34mL OMNIPAQUE IOHEXOL 300 MG/ML  SOLN COMPARISON:  05/02/2018 FINDINGS: Urinary Tract:  No abnormality visualized. Bowel:  Unremarkable visualized pelvic bowel loops. Vascular/Lymphatic: Atheromatous arterial calcifications without aneurysm. No enlarged lymph nodes. Reproductive: Small uterus containing multiple small, densely calcified fibroids. No adnexal mass. Other: Small superficial soft tissue defect posteriorly to the left of the coccyx. This does not extend to the bone and there is no bone destruction or periosteal reaction. Slightly more inferiorly,  there is a very small fluid and gas collection, measuring 1.0 x 0.4 cm on image number 35 series 2 and 1.9 cm in length on coronal image number 78. Also noted is diffuse subcutaneous edema. The previously demonstrated bilateral inguinal hernias are no longer seen. Musculoskeletal: No bone destruction or periosteal reaction. Lower lumbar spine degenerative changes. IMPRESSION: 1. Small soft tissue ulcer posteriorly to the left of the coccyx with a 1.9 x 1.0 x 0.4 cm abscess slightly more inferiorly. 2. No evidence of osteomyelitis. 3. Diffuse subcutaneous edema. Electronically Signed   By: Claudie Revering M.D.   On: 02/02/2019 17:13   Dg Chest Port 1 View  Result Date: 02/02/2019 CLINICAL DATA:  Cough and tachypnea with tachycardia. EXAM: PORTABLE CHEST 1 VIEW COMPARISON:  01/23/2019 FINDINGS: Patient slightly rotated to the left. Lungs are adequately inflated demonstrate hazy bilateral perihilar opacification likely mild interstitial edema. Suggestion of small amount of bilateral pleural fluid. Left effusion improved compared to previous exams. Mild stable cardiomegaly. Remainder of the exam is unchanged. IMPRESSION: Evidence of mild interstitial edema and small bilateral pleural effusions with significant interval improvement in the left effusion. Central lung infection is possible but less likely. Mild stable cardiomegaly. Electronically Signed   By: Marin Olp M.D.   On: 02/02/2019 16:04   Anti-infectives (From admission, onward)   None      Assessment/Plan Sacral Wound with abscess - CT of the sacrum was done that showed a small soft tissue ulcer posteriorly to the left of the coccyx with a 1.9 x 1.0 x 0.4 cm abscess inferiorly.  The area of concern is already spontaneously draining.  I was able to probe the area with a Q-tip and break up a few loculations as well as obtain a wound culture. Will defer abx to ID.  The wound does communicate with a small, 3 mm wound approximately 2 cm inferior to the  larger sacral wound.  There is currently no indication for further surgery.  Given the patient's decline in mobility I do not feel that opening this wound larger would be beneficial to her.  It is already spontaneously draining adequately.  I believe wound care q shift with packing, geomat for chair, frequent turning in the bed and air mattress for pressure displacement would be  beneficial. Will cover periwound redness with nystatin cream. Will ask for Hydrotherapy to see sacral wound as well. Will also consult WOCN for their expertise in wound care. Appreciate both services assistance.  We will continue to follow.  Jillyn Ledger, [facility] Surgery 02/03/2019, 11:31 AM Pager: [phone]

## 2019-02-03 NOTE — Evaluation (Signed)
SLP Cancellation Note  Patient Details Name: Sydney [name] MRN: [id number] DOB: 1930-09-23   Cancelled treatment:       Reason Eval/Treat Not Completed: (pt working with pt/ot at this time, will continue efforts)   [name] 02/03/2019, 11:58 AM   Luanna Salk, [place] [facility] SLP Acute Rehab Services Pager [phone] Office [phone]

## 2019-02-03 NOTE — Progress Notes (Signed)
Attempted to give pt evening medication. Pt requested RN to wait on medication. Second attempt made to give evening medication and pain meds offered. Pt states she is in pain, but requests to be left alone to bathe. RN explained to pt that we would assist w/ a bath. Pt increasingly agitated and anxious tells RN to leave her alone to let her "get herself together." Will offer bath and pain medication once pt is more at ease.

## 2019-02-03 NOTE — Progress Notes (Signed)
   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  So I told her that should proceed with antibiotics. Will defer to [facility] and/or ID for choice.  Gatha Mayer, MD, [facility] 02/03/2019 12:46 PM Pager [phone]

## 2019-02-03 NOTE — Evaluation (Signed)
Occupational Therapy Evaluation Patient Details Name: Sydney [name] MRN: [id number] DOB: 05-25-1930 Today's Date: 02/03/2019    History of Present Illness Sydney [name] is a 85 y.o. 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   Clinical Impression   Pt seen for OT evaluation this date. Prior to last hospital admission 11/2018, pt was Independent with most ADLs (requiring assist with higher level ADLs such as bathing). However, upon d/c was requiring total assist for OOB t/f's and d/c'ed home with [facility] therapy, dtr's assistance, ad lift for OOB which was unable to be initiated prior to re-admission.  Pt lives with dtr in [facility] with 4 STE.  Currently pt demonstrates impairments in general fxl activity tolerance, pain in sacral area, and overall strength of trunk and limbs, requiring MAX A x2 for bed mobility, MOD A for self feeding and G+H tasks, MAX A for UB dressing and TOTAL A for most LB ADLs. Pt would benefit from skilled OT to address noted impairments and functional limitations (see below for any additional details) in order to maximize safety and independence while minimizing falls risk and caregiver burden.  Rec HHOT at d/c to optimize positioning and general tolerance efforts in the home environment as able.     Follow Up Recommendations  Home health OT    Equipment Recommendations  None recommended by OT(assuming all recommendations from previous hospitalizations met at home)    Recommendations for Other Services       Precautions / Restrictions Precautions Precautions: Fall Precaution Comments: sacral wound Restrictions Weight Bearing Restrictions: No      Mobility Bed Mobility Overal bed mobility: Needs Assistance Bed Mobility: Rolling Rolling: Max assist;+2 for physical assistance         General bed mobility comments: Max A  with hand over hand guidance towaqrd bed rail for lateral rolling for repositioning efforts.  Transfers                 General transfer comment: transfers deferred at this time d/t pain and decreased tolerance.    Balance       Sitting balance - Comments: no balance tasks completed at time of eval d/t deferral of bed mobility (sup<>sit) secondary to c/o pain and low tolerance.       Standing balance comment: deferred at time of eval. See above                           ADL either performed or assessed with clinical judgement   ADL Overall ADL's : Needs assistance/impaired Eating/Feeding: Minimal assistance;Bed level Eating/Feeding Details (indicate cue type and reason): dtr assisted with cup of water for hand to mouth. Grooming: Wash/dry face;Maximal assistance;Bed level Grooming Details (indicate cue type and reason): based on clinical observation Upper Body Bathing: Maximal assistance;Bed level Upper Body Bathing Details (indicate cue type and reason): based on clinical observation d/t limited use of UEs at this time secondary to pain Lower Body Bathing: Total assistance;Bed level Lower Body Bathing Details (indicate cue type and reason): based on clinical observation d/t limited  tolerance for fxl reaching to complete any self care. Upper Body Dressing : Maximal assistance;Bed level Upper Body Dressing Details (indicate cue type and reason): based on clinical observation d/t limited use of UEs at this time secondary to pain Lower Body Dressing: Total assistance;Bed level Lower Body  Dressing Details (indicate cue type and reason): Pt unable to tolerate participation in adjusting socks for better fit prior to TOTAL A bed mobility. Toilet Transfer: Total assistance Toilet Transfer Details (indicate cue type and reason): Pt requiring bed level assist for toileting at this time Toileting- Clothing Manipulation and Hygiene: Total assistance Toileting - Clothing  Manipulation Details (indicate cue type and reason): bed level incontinence care reported and appropriate based on clinical observation during attempts for bed mobility. Tub/ Shower Transfer: Total assistance Tub/Shower Transfer Details (indicate cue type and reason): not completed on eval, limited ability to engage pt in even bed level mobility secondary to pain Functional mobility during ADLs: (functional mobility unable to be assessed at time of eval d/t very limited tolerance for general activity on any level secondary to pain)       Vision Baseline Vision/History: Wears glasses Wears Glasses: At all times Patient Visual Report: No change from baseline Additional Comments: limited eye opening for majority of session. Glasses not donned during session     Perception     Praxis      Pertinent Vitals/Pain Pain Assessment: Faces Faces Pain Scale: Hurts whole lot Pain Location: buttocks with any attempts to mobilize Pain Descriptors / Indicators: Grimacing;Moaning;Tender Pain Intervention(s): Monitored during session;Repositioned;Limited activity within patient's tolerance     Hand Dominance Right   Extremity/Trunk Assessment Upper Extremity Assessment Upper Extremity Assessment: Generalized weakness;RUE deficits/detail;LUE deficits/detail RUE Deficits / Details: grossly <3/5, limited ability to formally assess d/t pt with c/o pain with any touch LUE Deficits / Details: grossly <3/5, limited ability to formally assess d/t pt with c/o pain with any touch   Lower Extremity Assessment Lower Extremity Assessment: Defer to PT evaluation;Generalized weakness RLE Deficits / Details: gross strength impairments.  ROM limited by pain and pt resistant to having therapist touch her.       Communication Communication Communication: HOH   Cognition Arousal/Alertness: Awake/alert Behavior During Therapy: Anxious Overall Cognitive Status: Impaired/Different from baseline Area of  Impairment: Orientation;Attention;Memory;Following commands;Awareness;Problem solving                 Orientation Level: Disoriented to;Situation;Time Current Attention Level: Selective Memory: Decreased short-term memory Following Commands: Follows one step commands inconsistently Safety/Judgement: Decreased awareness of safety;Decreased awareness of deficits Awareness: Intellectual Problem Solving: Slow processing;Decreased initiation;Difficulty sequencing;Requires verbal cues;Requires tactile cues General Comments: Pt requests, "leave me alone for the night" after repositioning efforts by PT/OT when asked to engage in incentive spirometry. Despite eval taking place in AM. Limited eye opening.   General Comments  Due to low tolerance secondary to pain, incentive spirometry education compelted with daughter who is present at bedside to encourage later in the day as pt declines to participate at this time citing fatigue.    Exercises Other Exercises Other Exercises: education re: incentive spirometry use, benefits of OOB activity-PNA prevention, decrease risk for further skin breakdown.   Shoulder Instructions      Home Living Family/patient expects to be discharged to:: Private residence Living Arrangements: Children Available Help at Discharge: Family;Available 24 hours/day Type of Home: House Home Access: Stairs to enter [employer] of Steps: 4 Entrance Stairs-Rails: Right;Left Home Layout: One level     Bathroom Shower/Tub: Tub only   Biochemist, clinical: Standard     Home Equipment: Grab bars - tub/shower;Walker - 2 wheels;Hospital bed;Other (comment)   Additional Comments: dtr is currently working from home but prior to [employer], dtr worked out of town and pt was home 12+ hrs a day  on her own      Prior Functioning/Environment Level of Independence: Needs assistance  Gait / Transfers Assistance Needed: Prior to 47 hospitalization starting 11/2018, pt walked  with no AD and dtr's assist. At [facility] she was requiring MAX to TOT A of 2. ADL's / Homemaking Assistance Needed: Pt was independent pre-morbid, but was no longer cooking d/t fatigue and required some assisst from dtr with higher level ADLs. On most recent d/c from acute setting, pt home with lift for t/f in/OOB, but unable to initiate with [facility] therapy prior to re-admission. Requiring Near total dependence for repositioning/bed mobility and self care.            OT Problem List: Decreased strength;Decreased activity tolerance;Decreased cognition;Decreased safety awareness;Pain;Cardiopulmonary status limiting activity;Impaired balance (sitting and/or standing)      OT Treatment/Interventions: Self-care/ADL training;Therapeutic exercise;Therapeutic activities;Balance training;Patient/family education;Cognitive remediation/compensation    OT Goals(Current goals can be found in the care plan section) Acute Rehab OT Goals Patient Stated Goal: For her mom to feel better and get stronger OT Goal Formulation: Patient unable to participate in goal setting Time For Goal Achievement: 02/10/19 Potential to Achieve Goals: Poor  OT Frequency: Min 1X/week   Barriers to D/C:            Co-evaluation PT/OT/SLP Co-Evaluation/Treatment: Yes Reason for Co-Treatment: Complexity of the patient's impairments (multi-system involvement);For patient/therapist safety PT goals addressed during session: Mobility/safety with mobility OT goals addressed during session: ADL's and self-care      AM-PAC OT "6 Clicks" Daily Activity     Outcome Measure Help from another person eating meals?: A Lot Help from another person taking care of personal grooming?: A Lot Help from another person toileting, which includes using toliet, bedpan, or urinal?: Total Help from another person bathing (including washing, rinsing, drying)?: Total Help from another person to put on and taking off regular upper body clothing?:  Total Help from another person to put on and taking off regular lower body clothing?: Total 6 Click Score: 8   End of Session    Activity Tolerance: Patient limited by fatigue;Patient limited by pain Patient left: in bed;with call bell/phone within reach;with bed alarm set;with family/visitor present  OT Visit Diagnosis: Muscle weakness (generalized) (M62.81);Pain Pain - part of body: (buttocks)                Time: 8421-0312 OT Time Calculation (min): 16 min Charges:  OT General Charges $OT Visit: 1 Visit OT Evaluation $OT Eval Moderate Complexity: 1 Mod OT Treatments $Therapeutic Activity: 8-22 mins  Gerrianne Scale, MS, OTR/L Pager [phone] 02/03/19, 1:17 PM

## 2019-02-03 NOTE — Consult Note (Signed)
NAME:  Sydney [name], MRN:  [id number], DOB:  Nov 09, 1930, LOS: 1 ADMISSION DATE:  02/02/2019, CONSULTATION DATE:  10/9 REFERRING MD: Alfredia Ferguson   , CHIEF COMPLAINT:  Hypoxia    Brief History   85 year old female patient with chronic failure to thrive readmitted on 10/9 with weakness,  poor p.o. intake & non-healing sacral wound .  Has been seen by multiple subspecialists, ultimately it is felt she simply has worsening failure to thrive and has not progressed.  She had an isolated episode of hypoxia on 10/9 requiring supplemental oxygen and therefore pulmonary was consulted.  History of present illness   This is a 85 year old female who was w/ cc: wheezing and sacral wound. Was just discharged 10/6 after being admitted for progressive weakness, malaise, fatigue and worsening Mental status. She was discharged after seeing multiple sub specialists and was eventually discharged to home w/out sig improvement. She was re-admitted 2 days after dc w/ poor po intake. On admission her HR was 120, wbc ct 13.7, CXR w/ small bilateral effusions. PCCM.  On 10/9 she had an isolated pulse ox reading of 84% on room air w/ wheezing. PCCM called to assess further   Past Medical History  Dysphagia and aspiration, Anemia, AS, C diff. W/ recent fecal transplant. Decub ulcer w/ abscess. FTT, TIA,  Significant Hospital Events   10/8 readmitted for failure to thrive and poor p.o. intake  Consults:  Infectious disease consulted for recent C. difficile infection GI consulted for recent C. difficile infection Pulmonary consulted for cough and hypoxia  Procedures:    Significant Diagnostic Tests:    Micro Data:  Sacral wound consult 10/9: Abundant gram-positive cocci in pairs abundant gram variable rods  Antimicrobials:  Ancef 10/8  Interim history/subjective:  Lying in bed reports significant discomfort having difficulty finding any comfortable position  Objective   Blood pressure (Abnormal) 146/79, pulse  (Abnormal) 103, temperature 98.1 F (36.7 C), temperature source Oral, resp. rate 18, height 5\' 6"  (1.676 m), weight 52.2 kg, SpO2 (Abnormal) 84 %.        Intake/Output Summary (Last 24 hours) at 02/03/2019 1451 Last data filed at 02/03/2019 1400 Gross per 24 hour  Intake 1018.81 ml  Output 400 ml  Net 618.81 ml   Filed Weights   02/02/19 1345  Weight: 52.2 kg    Examination: General: Frail, cachectic, debilitated 85 year old female anxious and confused at times HENT: Dried mucus in the posterior pharynx, marked upper airway rhonchi, poor cough mechanics, temporal wasting, sclera nonicteric.  No jugular venous distention. Lungs: Diffuse rhonchi, tachypneic at times Cardiovascular: Regular rhythm Abdomen: Soft nontender Extremities: No significant edema, sacral wound dressing intact Neuro: Confused, yelling out in pain.  Moves extremities.  Very hard of hearing. 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  Her intermittent hypoxia is almost certainly a consequence of intermittent mucous plugging with her poor cough mechanics.  She is absolutely not  a candidate for bronchoscopy or aggressive pulmonary interventions nor does her family seek this.  Unfortunately unless we improve her underlying  nutritional status I do not see much hope for improvement.  Family has declined alternative feeding and unfortunately the patient has very poor p.o. intake.  She would need significant improvement in strength and nutritional status to better support her pulmonary mechanics, she is certainly at risk for aspiration pneumonia however she does not currently appear to be infected from a pulmonary standpoint.  Plan/recommendations Continue supplemental oxygen Add flutter valve Get oral sxn to bedside  Aspiration precautions No role for CT imaging Defer antibiotics to infectious disease, think focusing on sacral wound is where focus should be at this point Unfortunately the daughter has adamantly declined hospice and palliative care on last hospitalization which is where she would most likely benefit most   Labs   CBC: Recent Labs  Lab 01/29/19 1503 02/02/19 1538 02/03/19 0257  WBC 7.3 13.7* 17.8*  NEUTROABS  --  12.2*  --   HGB 10.6* 9.5* 11.7*  HCT 33.3* 30.3* 35.8*  MCV 100.3* 99.3 96.2  PLT 257 245 0000000    Basic Metabolic Panel: Recent Labs  Lab 01/28/19 0330 01/29/19 0855 01/31/19 0355 02/02/19 1538 02/03/19 0257  NA 135 135 133* 137 140  K 3.2* 4.1 3.6 3.2* 3.4*  CL 110 113* 108 114* 113*  CO2 18* 14* 15* 13* 13*  GLUCOSE 87 98 75 85 67*  BUN 16 14 14 18 18   CREATININE 0.74 0.75 0.81 0.82 0.83  CALCIUM 7.5* 7.6* 7.9* 7.8* 8.1*  MG  --  2.1  --   --  1.9  PHOS  --   --   --   --  3.4   GFR: Estimated Creatinine Clearance: 40.1 mL/min (by C-G formula based on SCr of 0.83 mg/dL). Recent Labs  Lab 01/29/19 1503 02/02/19 1538 02/02/19 2005 02/03/19 0257  WBC 7.3 13.7*  --  17.8*  LATICACIDVEN  --   --  1.5  --     Liver Function Tests: Recent Labs  Lab 01/28/19 0330 01/29/19 0855 01/31/19 0355 02/02/19 1538 02/03/19 0257  AST 44* 51* 39 41 39  ALT 37 34 27 26 25   ALKPHOS 168* 159* 161* 158* 144*  BILITOT 0.4 1.1 1.0 1.0 0.9  PROT 5.2* 5.8* 5.9* 6.5 6.3*    ALBUMIN 1.8* 1.9* 2.0* 2.3* 2.3*   No results for input(s): LIPASE, AMYLASE in the last 168 hours. No results for input(s): AMMONIA in the last 168 hours.  ABG    Component Value Date/Time   HCO3 15.5 (L) 10/13/2016 0257   TCO2 25 05/23/2017 1017   ACIDBASEDEF 11.0 (H) 10/13/2016 0257   O2SAT 60.0 10/13/2016 0257     Coagulation Profile: No results for input(s): INR, PROTIME in the last 168 hours.  Cardiac Enzymes: Recent Labs  Lab 01/28/19 0330  CKTOTAL 72    HbA1C: Hgb A1c MFr Bld  Date/Time Value Ref Range Status  10/03/2017 04:48 AM 5.8 (H) 4.8 - 5.6 % Final    Comment:    (NOTE) Pre diabetes:          5.7%-6.4% Diabetes:              >6.4% Glycemic control for   <7.0% adults with diabetes   04/09/2015 07:50 AM 5.8 (H) 4.8 - 5.6 % Final    Comment:    (NOTE)         Pre-diabetes: 5.7 - 6.4  Diabetes: >6.4         Glycemic control for adults with diabetes: <7.0     CBG: Recent Labs  Lab 01/29/19 1339 01/30/19 0811 01/30/19 0910 01/31/19 0800 01/31/19 1116  GLUCAP 107* 67* 110* 7* 107*    Review of Systems:   Unable   Past Medical History  She,  has a past medical history of Anemia, Aortic atherosclerosis ([place]), Arthritis, Bilateral inguinal hernia, C. difficile diarrhea, Cataract, Dehydration (12/29/2018), Diplopia, Dizzy, Dyslipidemia, Gastritis, Glaucoma, High cholesterol, Hypertension, Leukopenia, Meningioma ([place]) (05/01/2015), Osteoporosis (02/2014), Palpitations, PVD (peripheral vascular disease) ([place]), Rhinitis, Syncopal episodes (2003), TIA (transient ischemic attack) (1990's), Trigger thumb of left hand, Weakness (12/29/2018), and Winter itch.   Surgical History    Past Surgical History:  Procedure Laterality Date   ANAL FISTULECTOMY  1970   COLONOSCOPY     COLONOSCOPY WITH PROPOFOL N/A 12/02/2016   Procedure: COLONOSCOPY WITH PROPOFOL;  Surgeon: Gatha Mayer, MD;  Location: [place];  Service: Endoscopy;  Laterality: N/A;    FECAL TRANSPLANT N/A 12/02/2016   Procedure: FECAL TRANSPLANT;  Surgeon: Gatha Mayer, MD;  Location: [facility] ENDOSCOPY;  Service: Endoscopy;  Laterality: N/A;   TONSILLECTOMY       Social History   reports that she has never smoked. She has never used smokeless tobacco. She reports that she does not drink alcohol or use drugs.   Family History   Her family history includes Heart attack in her father; Hypertension in her father; Stroke in her brother and maternal grandmother.   Allergies Allergies  Allergen Reactions   Demerol [Meperidine] Other (See Comments)    Excessive sweating, cramping   Other     No Antibiotic without consultation with her primary physician.   Codeine Rash   Sulfa Antibiotics Itching and Other (See Comments)    Reaction unknown   Tomato Itching     Home Medications  Prior to Admission medications   Medication Sig Start Date End Date Taking? Authorizing Provider  aspirin (ECOTRIN LOW STRENGTH) 81 MG EC tablet Take 81 mg by mouth daily. Swallow whole.   Yes [provider]  cholecalciferol (VITAMIN D) 1000 units tablet Take 1,000 Units by mouth daily.   Yes [provider]  clopidogrel (PLAVIX) 75 MG tablet Take 75 mg by mouth daily.   Yes [provider]  folic acid (FOLVITE) 1 MG tablet Take 1 mg by mouth daily.   Yes [provider]  Hydrocortisone (GERHARDT'S BUTT CREAM) CREA Apply 1 application topically 3 (three) times daily. Apply barrier cream to buttocks/sacrum TID and PRN with each incontinent cleaning session 01/31/19 03/02/19 Yes Ghimire, Henreitta Leber, MD  latanoprost (XALATAN) 0.005 % ophthalmic solution Place 1 drop into both eyes at bedtime.    Yes [provider]  loperamide (IMODIUM) 2 MG capsule Take 1 capsule (2 mg total) by mouth as needed for diarrhea or loose stools. 01/31/19  Yes Ghimire, Henreitta Leber, MD  megestrol (MEGACE) 400 MG/10ML suspension Take 10 mLs (400 mg total) by mouth 3 (three)  times daily. 01/31/19 03/02/19 Yes Ghimire, Henreitta Leber, MD  mirtazapine (REMERON SOL-TAB) 45 MG disintegrating tablet Take 1 tablet (45 mg total) by mouth at bedtime. 01/31/19  Yes Ghimire, Henreitta Leber, MD  Multiple Vitamin (MULTIVITAMIN WITH MINERALS) TABS tablet Take 1 tablet by mouth daily. 02/01/19  Yes Ghimire, Henreitta Leber, MD  nystatin (MYCOSTATIN) 100000 UNIT/ML suspension Take 5 mLs (500,000 Units total) by mouth 4 (four) times daily as needed for up to  10 days (thrush). 01/31/19 02/10/19 Yes Ghimire, Henreitta Leber, MD  nystatin (MYCOSTATIN/NYSTOP) powder Apply topically 2 (two) times daily. Apply to affected area on the skin folds 01/31/19  Yes Ghimire, Henreitta Leber, MD  pantoprazole (PROTONIX) 20 MG tablet Take 20 mg by mouth daily. 12/09/18  Yes [provider]  traMADol (ULTRAM) 50 MG tablet Take 0.5 tablets (25 mg total) by mouth every 12 (twelve) hours as needed for moderate pain. 01/31/19  Yes Ghimire, Henreitta Leber, MD  Amino Acids-Protein Hydrolys (FEEDING SUPPLEMENT, PRO-STAT SUGAR FREE 64,) LIQD Take 30 mLs by mouth 2 (two) times daily with a meal. Patient not taking: Reported on 02/02/2019 01/31/19 03/02/19  Jonetta Osgood, MD     Critical care time: NA   Erick Colace ACNP-BC [place] Pager # [phone] OR # [phone] if no answer

## 2019-02-03 NOTE — Progress Notes (Addendum)
RN & NT returned to patients room to reposition. Pt and daughter refused for pt to be repositioned. Pt appears to be upset and tearful. Pain medication suggested to assist with discomfort. Pt and daughter refused.  Pt's daughter request that no medications for pain other than tylenol. Education provided regarding the importance of turning patient every two hours to assist with healing.

## 2019-02-03 NOTE — Consult Note (Signed)
   [facility] CM Inpatient Consult   02/03/2019  Sydney [name] 02-08-1931 [id number]  Referral received from ED [facility] for possible [facility] Care Management services as patient is in the [facility] under Medicare insurance plan. Patient has high readmission risk score, 24%, 30 day unplanned readmission, and 3 inpatient admissions within the past 6 months.  [name] was admitted to inpatient bed last night. Will follow for progression and disposition plans.  Of note, [facility] Care Management services does not replace or interfere with any services that are arranged by inpatient case management or social work.  [name], MSN, [facility] Liaison Nurse Mobile Phone [phone]  Toll free office [phone]

## 2019-02-03 NOTE — Progress Notes (Signed)
PROGRESS NOTE    Sydney [name]  TML:[id number] DOB: 07-12-1930 DOA: 02/02/2019 PCP: [name], MD  Brief Narrative:  HPI per Dr. Orene Desanctis on 02/02/2019 Sydney [name] is a 85 y.o. 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  Daughter who is her HCPOA provides most of the history.  Patient was recently hospitalized from 12/19/2018 and discharged 2 days ago on 10/6.  She was admitted for progressive weakness, malaise, fatigue and significant failure to thrive.  She also developed encephalopathy and worsening liver enzymes.  She had an extensive work-up and was seen by numerous subspecialists including ID, GI, oncology and neurology without any significant improvement in her failure to thrive syndrome.  She was discharged home with home health given daughter was resistant to SNF and hospice care.  Since discharge, daughter reports that she has not had any acute changes.  Her appetite continues to be poor.  Daughter was helping her to get supplemental home health nursing and had a nurse come evaluate her.  The nurse was concerned about wheezing on her respiratory exam and her sacral wound prompting her to come into the ED.  Daughter was unsure whether she was hypoxic at home.  Denies any fever, cough or shortness of breath.  Denies any chest pain.  Denies any nausea, vomiting or abdominal pain.  Patient has chronic diarrhea from her history of C. difficile with stool transplant and daughter has noticed increased frequency of her bowel movements. Daughter would like her to get her sacral wound care for and her goal was for her mother to be able to ambulate on her own. She does not want her mother to receive any narcotics or opioids specifically morphine and tramadol since she felt it sedated her mother during prior admission.  Also does not want Ativan.  Daughter states that she strongly wants [place] to consult infectious disease and GI prior to starting any antibiotics given her history.  ED Course: She was afebrile and had elevated blood pressure up to 164/100.  Also tachycardic up to 120s. CBC showed leukocytosis of 13.7 and hemoglobin of 9.5 which is around her baseline.  CMP showed potassium of 3.2, mildly elevated alkaline phosphatase 158 which is around her baseline.  Chest x-ray showed evidence of mild interstitial edema and small bilateral pleural effusion with significant interval improvement in the left effusion.  Central lung infection is possible but less likely.  Mild stable cardiomegaly. CT pelvis showed small soft tissue ulcer posterior to the left of the coccyx with a 1.9 x 1 x 0.4 cm abscess slightly more inferior.  No evidence of osteomyelitis.  Diffuse subcutaneous edema.  **Interim History General surgery was consulted for further evaluation of her sacral wound and abscess.  Patient's daughter would not let me put her on any antibiotics unless I spoke with ID so ID was consulted and as a courtesy GI was consulted as well and Dr. Carlean Purl saw the patient briefly and talk with her daughter as a Manufacturing engineer.  Antibiotics were started with IV cefazolin and cultures were sent as her wound abscess was spontaneously draining.  Patient started having some respiratory distress likely due to intermittent mucous plugging because of her failure to thrive and ability to cough up her secretions as well as mild volume overload so Pulmonary was consulted for further evaluation she is transferred to telemetry for continuous pulse oximetry  monitoring.    Flutter valve is recommended however patient daughter refused and repositioning refused.  Patient's daughter is only requesting the patient be given Tylenol.  Ethics was consulted for further evaluation as guidance was sought and [employer] spoke to me at length and recommended discharging the patient home as  soon as possible pending culture results and having a Education officer, museum come out to evaluate no living situation.  Patient is incredibly frail debilitated and has severe failure to thrive and has had significant poor p.o. intake.  She is visibly in pain despite this but daughter is refusing medications for the patient.  An appropriate approach would be to transition the patient to comfort measures however patient daughter declined I restart the palliative care team this morning and I spoke with Dr. Hilma Favors at length who states that her team will not follow currently given Daughter's refusal last hospitalization.   Assessment & Plan:   Active Problems:   Sacral ulcer ([place])  Failure to thrive/deconditioning/dehydration with Hypoglycemia from poor po intake -Patient had extensive work-up without any findings with numerous specialist at last admission. Reportedly felt that patient has met maximal benefit during last hospital stay and was discharged and Ethics involved.  -Daughter declined NG or PEG tube and refused hospice at discharge. Ethics committee was noted to be involved. See last d/c note for full details.  -Pt is tachycardiac but likely secondary to dehydration. No fever but Leukocytosis is worsening -Consult dietician - SLP for swallow study- pt high risk for aspiration  -PT/OT recommending Home Health as daughter refusing SNF -Continue Megace -Started IVF hydration with IV Sodium Bicarbonate at 50 mL/hr Given concern for volume overload  Acute Respiratory Failure with Hypoxia and Cough in setting of known diastolic dysfunction along with -CXR was read as"Enlarged cardiac silhouette. Calcific atherosclerotic disease of the aorta. Bilateral moderate in size pleural effusions. Worsening aeration of the lungs with focal airspace opacity in the right upper lobe. Mild pulmonary edema. Osseous structures are without acute abnormality. Soft tissues are grossly normal." by the Radiologist -Given Lasix  at 20 mg IV and will repeat again this evening -Pulmonary feels Pleural effusions are small and that she has vascular congestion that is probably contributing to her intermittent Hypoxemia but Dr. Lamonte Sakai believes that the cause of this is likely related to some degree of diastolic dysfunction but more so malnutrition and poor oncotic pressures as well as possibly noncardiac inflammatory edema due to ongoing infection or intermittent aspiration.  He suspects that the biggest contributor to her recent acute change in oxygen need relation to her impaired airway protection, mucus and secretion management given her malnourishment nonhealing wound status and progressive weakness; I have personally reviewed her chest x-ray and I am in agreement with Dr. Lamonte Sakai -Given Acidosis c/w Gentle IVF at 15 mL's per hour -Flutter Valve, Incentive Spirometry, and Guaifenesin -Temporizing measures with suctioning and pulmonary hygiene -Continue with speech therapy and aspiration precautions -Pulmonary feels no definitive role for CT imaging and do not feel that that to make any remarkable change and improvement on his nutritional status is improved, strength building and physical therapy along with adequate management of her underlying infection and Dr. Corky Sox expects progressive weakening, worsening airway protection and aspiration -Pulmonary has recommended a comfort based approach and the daughter has adamantly refused -We will diurese and continue the watch patient's respiratory status along with gentle fluid hydration given her poor p.o. intake -If necessary we will discharge the patient with oxygen at home -Case  was discussed with [place] who feels that the most appropriate course of action would be to find out what culture she is going to treat her with p.o. antibiotics and discharge her home with oxygen if necessary knowing that she will likely come back to the hospital given the daughter's opposition to hospice  and comfort based measures  -Patient should really transition to comfort care measures at this time however patient daughter is adamant and opposed and will stabilize the patient and after he can be discharged home once cultures are resulted  Metabolic Acidosis -In the setting of dehydration and poor p.o. intake Patient CO2 of 13, anion gap was 14 and chloride levels 113 -IV fluids as above -Continue monitor and trend repeat CMP in a.m.  Acute Urinary Retention -I and O Cath -Give Lasix x2 -Renal U/S -Continue to Monitor Strict I's and O's  Sacral Decubitus Ulcer Stage4 with a Tract and Now Abscess -Continue local wound care-appreciate wound care RN input and Surgical Evaluation -She continues to have discomfort at the sacral site and Daughter refuses any medication but Tylenol -Cx's sent -Started Empiric Abx with Cefazolin per ID Recc's -Initial Gram Stain showed "RARE WBC PRESENT,BOTH PMN AND MONONUCLEAR  ABUNDANT GRAM POSITIVE COCCI IN PAIRS  ABUNDANT GRAM VARIABLE RODS" with Cx's Pending  -Hydrotherapy recommended by General Surgery  Deconditioning/Debility: -Secondary to prolonged hospitalization/acute illness-and underlying failure to thrive syndrome.   -PT evaluated the patient recommending Home Health as patient's daughter will not take her to SNF  Severe Malnutrition in the Context of Chronic Illness -Nutritionist consulted for further evaluation and recommendations -Nutritionist recommended ordering Ensure Enlive BID, Magic Cup BID, and Juven BID and recommending Floor staff to assist with feeding as needed if daughter is not present   Hypokalemia  -K+ was 3.4 -Replete with IV KCl -Checked Mag Level and was 1.9 -Continue to Monitor and Replete as Necessary -Repeat CMP in AM   Oral thrush -Nystatin suspension  Normocytic Anemia -Hemoglobin of 9.5 on admission and now Hgb/Hct is 11.7/35.8. Stable with no signs of bleeding. -Continue to monitor CBC  HTN  -stable without antihypertensive  History of C. difficile colitis s/p stool transplantation -Continues to have loose stool and daughter endorse increased frequency in the past few days.  However, she does not have a fever or abdominal pain to suggest recurrent C. difficile colitis -Infectious disease recommendation at last admission was to continue Imodium as needed -GI has no hesitation using Abx per Dr. Celesta Aver note  History of TIA -Continue aspirin and Plavix   DVT prophylaxis: (Lovenox/Heparin/SCD's/anticoagulated/None (if comfort care) Code Status: FULL CODE Family Communication: Discussed with Daughter at Bedside Disposition Plan: Anticipate D/C Home in the next 24-48 hours pending Cx results  Consultants:   General Surgery  Infectious Diseases  Pulmonary  Discussed with Gastroenterology Dr. Carlean Purl  Discussed with Palliative Care Dr. Chrys Racer -Spoke with [place] Primer   Procedures: None   Antimicrobials:  Anti-infectives (From admission, onward)   Start     Dose/Rate Route Frequency Ordered Stop   02/03/19 1500  ceFAZolin (ANCEF) IVPB 1 g/50 mL premix     1 g 100 mL/hr over 30 Minutes Intravenous Every 8 hours 02/03/19 1444       Subjective: Patient seen and examined at bedside and she was intermittently confused but she is very frail and cachectic and appeared to be in some distress with pain especially when being turned.  Patient's daughter does not want anything but Tylenol  for her mother.  Patient has been intermittently confused and has been pulling out her IVs and went into respiratory distress today was transferred to the telemetry unit.  Pulmonary was consulted.  Patient and patient's daughter has been refusing some interventions that have been recommended to them.  Ethics was called again.  It appears patient's daughter has not come to terms with patient's mortality and refuses to believe that she is dying.  She continues to refuse the comfort  measures approach.  Objective: Vitals:   02/02/19 2118 02/02/19 2151 02/03/19 0613 02/03/19 1350  BP:  139/81 137/80 (!) 146/79  Pulse: (!) 106 (!) 108 97 (!) 103  Resp: (!) '25 18 15 18  '$ Temp:  98.1 F (36.7 C) 98.9 F (37.2 C) 98.1 F (36.7 C)  TempSrc:  Oral Oral Oral  SpO2: 100% 99% 98% (!) 84%  Weight:      Height:        Intake/Output Summary (Last 24 hours) at 02/03/2019 1537 Last data filed at 02/03/2019 1400 Gross per 24 hour  Intake 1018.81 ml  Output 400 ml  Net 618.81 ml   Filed Weights   02/02/19 1345  Weight: 52.2 kg   Examination: Physical Exam:  Constitutional: Extremely thin and cachectic frail elderly African-American female and some distress appears uncomfortable Eyes: Lids and conjunctivae normal, sclerae anicteric  ENMT: External Ears, Nose appear normal. Grossly normal hearing. Mucous membranes are dry Neck: Appears normal, supple, no cervical masses, normal ROM, no appreciable thyromegaly; no JVD Respiratory: Diminihsed to auscultation bilaterally with coarse breath sounds and some mild wheezing; no wheezing, rales, rhonchi or crackles. Normal respiratory effort and patient is not tachypenic. Patient unable to clear her cough Cardiovascular: Mildly tachycardic rate but regular rhythm. no murmurs / rubs / gallops. S1 and S2 auscultated. No LE extremity edema.  Abdomen: Soft, non-tender, non-distended. Bowel sounds positive x4.  GU: Deferred. Musculoskeletal: No clubbing / cyanosis of digits/nails. No joint deformity upper and lower extremities.  Skin: Has a sacral decubitus ulcer with a communicating tract that is spontaneously draining  Neurologic: CN 2-12 grossly intact with no focal deficits. Romberg sign and cerebellar reflexes not assessed.  Psychiatric: Impaired judgment and insight. Awake and agitated slightly due to pain.   Data Reviewed: I have personally reviewed following labs and imaging studies  CBC: Recent Labs  Lab 01/29/19 1503  02/02/19 1538 02/03/19 0257  WBC 7.3 13.7* 17.8*  NEUTROABS  --  12.2*  --   HGB 10.6* 9.5* 11.7*  HCT 33.3* 30.3* 35.8*  MCV 100.3* 99.3 96.2  PLT 257 245 542   Basic Metabolic Panel: Recent Labs  Lab 01/28/19 0330 01/29/19 0855 01/31/19 0355 02/02/19 1538 02/03/19 0257  NA 135 135 133* 137 140  K 3.2* 4.1 3.6 3.2* 3.4*  CL 110 113* 108 114* 113*  CO2 18* 14* 15* 13* 13*  GLUCOSE 87 98 75 85 67*  BUN '16 14 14 18 18  '$ CREATININE 0.74 0.75 0.81 0.82 0.83  CALCIUM 7.5* 7.6* 7.9* 7.8* 8.1*  MG  --  2.1  --   --  1.9  PHOS  --   --   --   --  3.4   GFR: Estimated Creatinine Clearance: 40.1 mL/min (by C-G formula based on SCr of 0.83 mg/dL). Liver Function Tests: Recent Labs  Lab 01/28/19 0330 01/29/19 0855 01/31/19 0355 02/02/19 1538 02/03/19 0257  AST 44* 51* 39 41 39  ALT 37 34 '27 26 25  '$ ALKPHOS 168* 159*  161* 158* 144*  BILITOT 0.4 1.1 1.0 1.0 0.9  PROT 5.2* 5.8* 5.9* 6.5 6.3*  ALBUMIN 1.8* 1.9* 2.0* 2.3* 2.3*   No results for input(s): LIPASE, AMYLASE in the last 168 hours. No results for input(s): AMMONIA in the last 168 hours. Coagulation Profile: No results for input(s): INR, PROTIME in the last 168 hours. Cardiac Enzymes: Recent Labs  Lab 01/28/19 0330  CKTOTAL 72   BNP (last 3 results) No results for input(s): PROBNP in the last 8760 hours. HbA1C: No results for input(s): HGBA1C in the last 72 hours. CBG: Recent Labs  Lab 01/29/19 1339 01/30/19 0811 01/30/19 0910 01/31/19 0800 01/31/19 1116  GLUCAP 107* 67* 110* 57* 107*   Lipid Profile: No results for input(s): CHOL, HDL, LDLCALC, TRIG, CHOLHDL, LDLDIRECT in the last 72 hours. Thyroid Function Tests: No results for input(s): TSH, T4TOTAL, FREET4, T3FREE, THYROIDAB in the last 72 hours. Anemia Panel: No results for input(s): VITAMINB12, FOLATE, FERRITIN, TIBC, IRON, RETICCTPCT in the last 72 hours. Sepsis Labs: Recent Labs  Lab 02/02/19 2005  LATICACIDVEN 1.5    Recent Results  (from the past 240 hour(s))  SARS CORONAVIRUS 2 (TAT 6-24 HRS) Nasopharyngeal Nasopharyngeal Swab     Status: None   Collection Time: 02/02/19  6:43 PM   Specimen: Nasopharyngeal Swab  Result Value Ref Range Status   SARS Coronavirus 2 NEGATIVE NEGATIVE Final    Comment: (NOTE) SARS-CoV-2 target nucleic acids are NOT DETECTED. The SARS-CoV-2 RNA is generally detectable in upper and lower respiratory specimens during the acute phase of infection. Negative results do not preclude SARS-CoV-2 infection, do not rule out co-infections with other pathogens, and should not be used as the sole basis for treatment or other patient management decisions. Negative results must be combined with clinical observations, patient history, and epidemiological information. The expected result is Negative. Fact Sheet for Patients: SugarRoll.be Fact Sheet for Healthcare Providers: https://www.woods-mathews.com/ This test is not yet approved or cleared by the [place] FDA and  has been authorized for detection and/or diagnosis of SARS-CoV-2 by FDA under an Emergency Use Authorization (EUA). This EUA will remain  in effect (meaning this test can be used) for the duration of the COVID-19 declaration under Section 56 4(b)(1) of the Act, 21 U.S.C. section 360bbb-3(b)(1), unless the authorization is terminated or revoked sooner. Performed at [facility], [place] [address] [place], [place] [address]   Aerobic Culture (superficial specimen)     Status: None (Preliminary result)   Collection Time: 02/03/19 11:46 AM   Specimen: Sacral; Wound  Result Value Ref Range Status   Specimen Description   Final    SACRAL Performed at [facility] [address], [place], [place] [address]    Special Requests   Final    NONE Performed at [facility], [place] [address] [place], [place] [address]    Gram Stain   Final    RARE WBC  PRESENT,BOTH PMN AND MONONUCLEAR ABUNDANT GRAM POSITIVE COCCI IN PAIRS ABUNDANT GRAM VARIABLE ROD Performed at [facility], [place] [address], [place], [place] [address]    Culture PENDING  Incomplete   Report Status PENDING  Incomplete     RN Pressure Injury Documentation: Pressure Injury 02/02/19 Buttocks Left Unstageable - Full thickness tissue loss in which the base of the ulcer is covered by slough (yellow, tan, gray, green or brown) and/or eschar (tan, brown or black) in the wound bed. (Active)  02/02/19 2213  Location:  Buttocks  Location Orientation: Left  Staging: Unstageable - Full thickness tissue loss in which the base of the ulcer is covered by slough (yellow, tan, gray, green or brown) and/or eschar (tan, brown or black) in the wound bed.  Wound Description (Comments):   Present on Admission: Yes   Radiology Studies: Ct Pelvis W Contrast  Result Date: 02/02/2019 CLINICAL DATA:  Sacral decubitus ulcer. EXAM: CT PELVIS WITH CONTRAST TECHNIQUE: Multidetector CT imaging of the pelvis was performed using the standard protocol following the bolus administration of intravenous contrast. CONTRAST:  90m OMNIPAQUE IOHEXOL 300 MG/ML  SOLN COMPARISON:  05/02/2018 FINDINGS: Urinary Tract:  No abnormality visualized. Bowel:  Unremarkable visualized pelvic bowel loops. Vascular/Lymphatic: Atheromatous arterial calcifications without aneurysm. No enlarged lymph nodes. Reproductive: Small uterus containing multiple small, densely calcified fibroids. No adnexal mass. Other: Small superficial soft tissue defect posteriorly to the left of the coccyx. This does not extend to the bone and there is no bone destruction or periosteal reaction. Slightly more inferiorly, there is a very small fluid and gas collection, measuring 1.0 x 0.4 cm on image number 35 series 2 and 1.9 cm in length on coronal image number 78. Also noted is diffuse subcutaneous edema. The previously demonstrated bilateral inguinal  hernias are no longer seen. Musculoskeletal: No bone destruction or periosteal reaction. Lower lumbar spine degenerative changes. IMPRESSION: 1. Small soft tissue ulcer posteriorly to the left of the coccyx with a 1.9 x 1.0 x 0.4 cm abscess slightly more inferiorly. 2. No evidence of osteomyelitis. 3. Diffuse subcutaneous edema. Electronically Signed   By: SClaudie ReveringM.D.   On: 02/02/2019 17:13   Dg Chest Port 1 View  Result Date: 02/02/2019 CLINICAL DATA:  Cough and tachypnea with tachycardia. EXAM: PORTABLE CHEST 1 VIEW COMPARISON:  01/23/2019 FINDINGS: Patient slightly rotated to the left. Lungs are adequately inflated demonstrate hazy bilateral perihilar opacification likely mild interstitial edema. Suggestion of small amount of bilateral pleural fluid. Left effusion improved compared to previous exams. Mild stable cardiomegaly. Remainder of the exam is unchanged. IMPRESSION: Evidence of mild interstitial edema and small bilateral pleural effusions with significant interval improvement in the left effusion. Central lung infection is possible but less likely. Mild stable cardiomegaly. Electronically Signed   By: DMarin OlpM.D.   On: 02/02/2019 16:04   Scheduled Meds: . aspirin EC  81 mg Oral Daily  . clopidogrel  75 mg Oral Daily  . feeding supplement (ENSURE ENLIVE)  237 mL Oral BID BM  . folic acid  1 mg Oral Daily  . Gerhardt's butt cream  1 application Topical TID  . guaiFENesin  1,200 mg Oral BID  . ipratropium  0.5 mg Nebulization Q6H  . latanoprost  1 drop Both Eyes QHS  . levalbuterol  0.63 mg Nebulization Q6H  . megestrol  400 mg Oral TID  . mirtazapine  45 mg Oral QHS  . multivitamin with minerals  1 tablet Oral Daily  . nutrition supplement (JUVEN)  1 packet Oral BID BM  . nystatin  5 mL Oral QID  . nystatin cream   Topical BID  . pantoprazole  20 mg Oral Daily   Continuous Infusions: .  ceFAZolin (ANCEF) IV    . potassium chloride    . sodium bicarbonate 150 mEq in  dextrose 5% 1000 mL 150 mEq (02/03/19 1533)    LOS: 1 day   OKerney Elbe DO [facility] PAGER is on [place] If 7PM-7AM, please contact night-coverage [email or web address] Password [email or web address]  02/03/2019, 3:37 PM

## 2019-02-03 NOTE — Evaluation (Signed)
Physical Therapy Evaluation Patient Details Name: Sydney [name] MRN: [id number] DOB: [birth year] Today's Date: 02/03/2019   History of Present Illness  Sydney [name] is a 85 y.o. 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 She had been home for a few days after lengthy hospitalization at [facility] where daugther declined SNF, palliative, and hospice.  Clinical Impression  Pt admitted with above diagnosis.  Pt currently with functional limitations due to the deficits listed below (see PT Problem List). Pt will benefit from skilled PT to increase their independence and safety with mobility to allow discharge to the venue listed below.  Pt with pain at eval and restless.  Daughter requesting nothing stronger Tylenol per nursing. Pt's daughter declined SNF at last hospital stay and therefore recommend home with family and HHPT.  They have a lift, hospital bed, and air mattress.     Follow Up Recommendations Supervision/Assistance - 24 hour;Home health PT    Equipment Recommendations  None recommended by PT    Recommendations for Other Services       Precautions / Restrictions Precautions Precautions: Fall Precaution Comments: sacral wound Restrictions Weight Bearing Restrictions: No      Mobility  Bed Mobility Overal bed mobility: Needs Assistance Bed Mobility: Rolling Rolling: Max assist         General bed mobility comments: Max A of 2 with rolling to the L with pt attempting to A with use of hand rail with pillow placement for semi sidelying.  TOT A of 2 to re-position  Transfers                    Ambulation/Gait                Stairs            Wheelchair Mobility    Modified Rankin (Stroke Patients Only)       Balance                                             Pertinent  Vitals/Pain Pain Assessment: Faces Faces Pain Scale: Hurts whole lot Pain Location: buttocks with any attempts to mobilize Pain Descriptors / Indicators: Grimacing;Moaning;Tender Pain Intervention(s): Monitored during session;Repositioned;Limited activity within patient's tolerance    Home Living Family/patient expects to be discharged to:: Private residence Living Arrangements: Children Available Help at Discharge: Family;Available 24 hours/day Type of Home: House Home Access: Stairs to enter Entrance Stairs-Rails: Psychiatric nurse of Steps: 4 Home Layout: One level Home Equipment: Grab bars - tub/shower;Walker - 2 wheels;Hospital bed;Other (comment) Additional Comments: dtr is currently working from home but prior to [employer], dtr worked out of town and pt was home 12+ hrs a day on her own    Prior Function Level of Independence: Needs assistance   Gait / Transfers Assistance Needed: Prior to 35 hospitalization starting 11/2018, pt walked with no AD and dtr's assist. At [facility] she was requiring MAX to TOT A of 2.  ADL's / Homemaking Assistance Needed: Pt was independent pre-morbid, but was no longer cooking d/t fatigue and required some assisst from dtr with higher level ADLs. On most recent d/c from acute setting, pt home with lift for t/f in/OOB, but unable to initiate with [facility] therapy prior  to re-admission. Requiring Near total dependence for repositioning/bed mobility and self care.        Hand Dominance   Dominant Hand: Right    Extremity/Trunk Assessment   Upper Extremity Assessment Upper Extremity Assessment: Generalized weakness;RUE deficits/detail;LUE deficits/detail RUE Deficits / Details: grossly <3/5, limited ability to formally assess d/t pt with c/o pain with any touch LUE Deficits / Details: grossly <3/5, limited ability to formally assess d/t pt with c/o pain with any touch    Lower Extremity Assessment Lower Extremity Assessment: Defer to  PT evaluation;Generalized weakness RLE Deficits / Details: gross strength impairments.  ROM limited by pain and pt resistant to having therapist touch her.       Communication   Communication: HOH  Cognition Arousal/Alertness: Awake/alert Behavior During Therapy: Anxious Overall Cognitive Status: Impaired/Different from baseline Area of Impairment: Orientation;Attention;Memory;Following commands;Awareness;Problem solving                 Orientation Level: Disoriented to;Situation;Time Current Attention Level: Selective Memory: Decreased short-term memory Following Commands: Follows one step commands inconsistently Safety/Judgement: Decreased awareness of safety;Decreased awareness of deficits Awareness: Intellectual Problem Solving: Slow processing;Decreased initiation;Difficulty sequencing;Requires verbal cues;Requires tactile cues General Comments: Pt requests, "leave me alone for the night" after repositioning efforts by PT/OT when asked to engage in incentive spirometry. Despite eval taking place in AM. Limited eye opening.      General Comments General comments (skin integrity, edema, etc.): Pt's daughter present throughout session.  Encouraged pt to cough due to congestion.  Gave incentive spirometer and pt refused to attempt.  Educated daughter on how to use.    Exercises     Assessment/Plan    PT Assessment Patient needs continued PT services  PT Problem List Decreased strength;Decreased activity tolerance;Decreased balance;Decreased mobility;Decreased knowledge of use of DME       PT Treatment Interventions DME instruction;Functional mobility training;Balance training;Neuromuscular re-education;Therapeutic exercise;Therapeutic activities    PT Goals (Current goals can be found in the Care Plan section)  Acute Rehab PT Goals Patient Stated Goal: for her mom to get stronger PT Goal Formulation: With family Time For Goal Achievement: 02/17/19 Potential to  Achieve Goals: Fair    Frequency Min 2X/week   Barriers to discharge        Co-evaluation PT/OT/SLP Co-Evaluation/Treatment: Yes Reason for Co-Treatment: Complexity of the patient's impairments (multi-system involvement);For patient/therapist safety PT goals addressed during session: Mobility/safety with mobility OT goals addressed during session: ADL's and self-care       AM-PAC PT "6 Clicks" Mobility  Outcome Measure Help needed turning from your back to your side while in a flat bed without using bedrails?: Total Help needed moving from lying on your back to sitting on the side of a flat bed without using bedrails?: Total Help needed moving to and from a bed to a chair (including a wheelchair)?: Total Help needed standing up from a chair using your arms (e.g., wheelchair or bedside chair)?: Total Help needed to walk in hospital room?: Total Help needed climbing 3-5 steps with a railing? : Total 6 Click Score: 6    End of Session   Activity Tolerance: Patient limited by pain Patient left: in bed;with call bell/phone within reach;with bed alarm set;with family/visitor present Nurse Communication: Mobility status PT Visit Diagnosis: Muscle weakness (generalized) (M62.81);Adult, failure to thrive (R62.7);Other abnormalities of gait and mobility (R26.89)    Time: [id number] PT Time Calculation (min) (ACUTE ONLY): 16 min   Charges:   PT Evaluation $PT Eval Low Complexity: 1  [place] Tamala Julian, [place] Pager [phone] 02/03/2019   Galen Manila 02/03/2019, 1:02 PM

## 2019-02-03 NOTE — Progress Notes (Signed)
Pt. Says that she will take treatment then she says that she wont.  She says that she does not want to be "messed" with and why cant we just leave her alone.  Pt. Does not want to take any treatments, period.  Daughter at bedside and she says her mother is tired and that the staff is always in the room.  RN aware that patient does not want treatments.

## 2019-02-03 NOTE — TOC Progression Note (Signed)
Transition of Care [facility]) - Progression Note    Patient Details  Name: SANAYA LILLIS MRN: [id number] Date of Birth: 11-11-1930  [facility] CM/SW Contact  Leeroy Cha, RN Phone Number: 02/03/2019, 2:51 PM  Clinical Narrative:    Orders reviewed for consult to case management.  Equipment only -not stateed what/ no orders for home 02   Expected Discharge Plan: [place] Barriers to Discharge: Continued Medical Work up  Expected Discharge Plan and Services Expected Discharge Plan: [place] In-house Referral: Clinical Social Work Discharge Planning Services: CM Consult Post Acute Care Choice: Durable Medical Equipment, Home Health Living arrangements for the past 2 months: Single Family Home                           HH Arranged: RN, PT, Nurse's Aide [place] Agency: [place] ([facility]) Date HH Agency Contacted: 02/02/19 Time [place]: [phone] Representative spoke with at [place]: [place] ([place]) Interventions    Readmission Risk Interventions Readmission Risk Prevention Plan 01/30/2019  Transportation Screening Complete  Medication Review Press photographer) Complete  PCP or Specialist appointment within 3-5 days of discharge Complete  HRI or [place] Complete  SW Recovery Care/Counseling Consult Complete  Palliative Care Screening Patient [place] Patient Refused  Some recent data might be hidden

## 2019-02-04 ENCOUNTER — Inpatient Hospital Stay (HOSPITAL_COMMUNITY): Payer: Medicare Other

## 2019-02-04 DIAGNOSIS — I361 Nonrheumatic tricuspid (valve) insufficiency: Secondary | ICD-10-CM

## 2019-02-04 DIAGNOSIS — I340 Nonrheumatic mitral (valve) insufficiency: Secondary | ICD-10-CM

## 2019-02-04 DIAGNOSIS — G459 Transient cerebral ischemic attack, unspecified: Secondary | ICD-10-CM | POA: Diagnosis present

## 2019-02-04 DIAGNOSIS — Z515 Encounter for palliative care: Secondary | ICD-10-CM

## 2019-02-04 DIAGNOSIS — I739 Peripheral vascular disease, unspecified: Secondary | ICD-10-CM | POA: Diagnosis present

## 2019-02-04 DIAGNOSIS — Z7189 Other specified counseling: Secondary | ICD-10-CM

## 2019-02-04 LAB — CBC WITH DIFFERENTIAL/PLATELET
Abs Immature Granulocytes: 0.22 10*3/uL — ABNORMAL HIGH (ref 0.00–0.07)
Basophils Absolute: 0 10*3/uL (ref 0.0–0.1)
Basophils Relative: 0 %
Eosinophils Absolute: 0 10*3/uL (ref 0.0–0.5)
Eosinophils Relative: 0 %
HCT: 29.7 % — ABNORMAL LOW (ref 36.0–46.0)
Hemoglobin: 9.7 g/dL — ABNORMAL LOW (ref 12.0–15.0)
Immature Granulocytes: 1 %
Lymphocytes Relative: 3 %
Lymphs Abs: 0.6 10*3/uL — ABNORMAL LOW (ref 0.7–4.0)
MCH: 31.2 pg (ref 26.0–34.0)
MCHC: 32.7 g/dL (ref 30.0–36.0)
MCV: 95.5 fL (ref 80.0–100.0)
Monocytes Absolute: 1 10*3/uL (ref 0.1–1.0)
Monocytes Relative: 4 %
Neutro Abs: 21.9 10*3/uL — ABNORMAL HIGH (ref 1.7–7.7)
Neutrophils Relative %: 92 %
Platelets: 233 10*3/uL (ref 150–400)
RBC: 3.11 MIL/uL — ABNORMAL LOW (ref 3.87–5.11)
RDW: 17.5 % — ABNORMAL HIGH (ref 11.5–15.5)
WBC: 23.8 10*3/uL — ABNORMAL HIGH (ref 4.0–10.5)
nRBC: 0 % (ref 0.0–0.2)

## 2019-02-04 LAB — COMPREHENSIVE METABOLIC PANEL
ALT: 24 U/L (ref 0–44)
AST: 35 U/L (ref 15–41)
Albumin: 2.4 g/dL — ABNORMAL LOW (ref 3.5–5.0)
Alkaline Phosphatase: 132 U/L — ABNORMAL HIGH (ref 38–126)
Anion gap: 13 (ref 5–15)
BUN: 20 mg/dL (ref 8–23)
CO2: 19 mmol/L — ABNORMAL LOW (ref 22–32)
Calcium: 8.1 mg/dL — ABNORMAL LOW (ref 8.9–10.3)
Chloride: 110 mmol/L (ref 98–111)
Creatinine, Ser: 0.95 mg/dL (ref 0.44–1.00)
GFR calc Af Amer: >60 mL/min (ref >60)
GFR calc non Af Amer: 54 mL/min — ABNORMAL LOW (ref >60)
Glucose, Bld: 146 mg/dL — ABNORMAL HIGH (ref 70–99)
Potassium: 3.4 mmol/L — ABNORMAL LOW (ref 3.5–5.1)
Sodium: 142 mmol/L (ref 135–145)
Total Bilirubin: 1 mg/dL (ref 0.3–1.2)
Total Protein: 6.5 g/dL (ref 6.5–8.1)

## 2019-02-04 LAB — GLUCOSE, CAPILLARY
Glucose-Capillary: 141 mg/dL — ABNORMAL HIGH (ref 70–99)
Glucose-Capillary: 144 mg/dL — ABNORMAL HIGH (ref 70–99)
Glucose-Capillary: 132 mg/dL — ABNORMAL HIGH (ref 70–99)
Glucose-Capillary: 136 mg/dL — ABNORMAL HIGH (ref 70–99)
Glucose-Capillary: 119 mg/dL — ABNORMAL HIGH (ref 70–99)
Glucose-Capillary: 108 mg/dL — ABNORMAL HIGH (ref 70–99)

## 2019-02-04 LAB — PHOSPHORUS: Phosphorus: 3 mg/dL (ref 2.5–4.6)

## 2019-02-04 LAB — ECHOCARDIOGRAM COMPLETE
Height: 66 in
Weight: 1840 oz

## 2019-02-04 LAB — MAGNESIUM: Magnesium: 1.9 mg/dL (ref 1.7–2.4)

## 2019-02-04 MED ORDER — LEVALBUTEROL HCL 0.63 MG/3ML IN NEBU
0.6300 mg | INHALATION_SOLUTION | Freq: Three times a day (TID) | RESPIRATORY_TRACT | Status: DC
  Administered 2019-02-04 – 2019-02-06 (×5): 0.63 mg via RESPIRATORY_TRACT
  Filled 2019-02-04 (×5): qty 3

## 2019-02-04 MED ORDER — FUROSEMIDE 10 MG/ML IJ SOLN
40.0000 mg | Freq: Once | INTRAMUSCULAR | Status: AC
  Administered 2019-02-04: 40 mg via INTRAVENOUS
  Filled 2019-02-04: qty 4

## 2019-02-04 MED ORDER — TRAMADOL HCL 50 MG PO TABS
50.0000 mg | ORAL_TABLET | Freq: Three times a day (TID) | ORAL | Status: DC | PRN
  Filled 2019-02-04: qty 1

## 2019-02-04 MED ORDER — POTASSIUM CHLORIDE 10 MEQ/100ML IV SOLN
10.0000 meq | INTRAVENOUS | Status: AC
  Administered 2019-02-04 (×3): 10 meq via INTRAVENOUS
  Filled 2019-02-04 (×3): qty 100

## 2019-02-04 MED ORDER — IPRATROPIUM BROMIDE 0.02 % IN SOLN
0.5000 mg | Freq: Three times a day (TID) | RESPIRATORY_TRACT | Status: DC
  Administered 2019-02-04 – 2019-02-06 (×5): 0.5 mg via RESPIRATORY_TRACT
  Filled 2019-02-04 (×5): qty 2.5

## 2019-02-04 MED ORDER — METRONIDAZOLE IN NACL 5-0.79 MG/ML-% IV SOLN
500.0000 mg | Freq: Three times a day (TID) | INTRAVENOUS | Status: DC
  Administered 2019-02-04 – 2019-02-07 (×9): 500 mg via INTRAVENOUS
  Filled 2019-02-04 (×9): qty 100

## 2019-02-04 NOTE — Progress Notes (Signed)
SLP Note  Patient Details Name: Sydney [name] MRN: [id number] DOB: July 23, 1930   RN contacted SLP for stat swallow eval per MD. SLP is currently at [place], will be by to [facility] in the pm. However this pt is well known to SLP service and has been recently evaluated on 9/22 with the finding of intermittent oral holding and spitting of PO, but also instances of adequate swallowing of thin liquids and soft solids. It is reasonable to allow daughter to attempt to feed her mother soft foods and liquids until assessment can be completed. Primary problems are cognitive in nature and result in very poor inconsistent intake and nutrition. RN will order a mech soft diet and thin liquids and allow pts daughter to feed her as she wishes. SLP will f/u as soon as possible.   Virgal [place], Katherene Ponto 02/04/2019, 11:54 AM

## 2019-02-04 NOTE — Progress Notes (Signed)
ROZLYN CHUBA RL:[id number] 1930/12/02  CARE TEAM:  PCP: [name], MD  Outpatient Care Team: Patient Care Team: [name], MD as PCP - General (Internal Medicine)  Inpatient Treatment Team: Treatment Team: Attending Provider: Kerney Elbe, DO; Consulting Physician: Edison Pace, Md, MD; Technician: Abbe Amsterdam, NT; Rounding Team: Threasa Beards, MD; Utilization Review: Orlean Bradford, RN; Registered Nurse: Hermina Staggers, RN; Speech Language Pathologist: Suzan Slick; Physical Therapist: Ernesta Amble, PT; Registered Nurse: Marletta Lor, RN; Case Manager: Dessa Phi, RN; Technician: Karie Schwalbe, NT   Problem List:   Active Problems:   Protein calorie malnutrition ([place])   Failure to thrive in adult   Sacral ulcer ([place])   SOB (shortness of breath)   Wheezing       Request called by nurse as well as hospitalist to discuss wound care with the daughter on her 85 year old mother.  Daughter wanting to speak with a surgeon today, wondering when hydrotherapy and other treatments will happen.  I entered the room with the patient, her daughter, and nursing team at bedside.  Patient on air mattress.  A little bit restless and fidgety but mostly consolable.  Long discussion explaining the reasoning for wound care.  Noted the wound has been assessed by surgery as well as wound ostomy care.  The wound is getting packed.  The nurses confirmed that.  Noted the reasoning for gauze packing to gently debride the wound.  The wound is draining.  There is no hard indication of shock nor Fournier's gangrene to warrant aggressive operative surgical intervention.  She has a chronic sacral decubitus.  There is some yellow drainage consistent with fat necrosis in the region and some tissue necrosis.  Usually this can be managed with packing.  We had ordered hydrotherapy in the hopes to washout and perhaps more aggressively but gently debride the wound and clean it  up more quickly.  My understanding is they will come by this afternoon.  Usually try and do gentle bedside hydrotherapy irrigation and occasional debridement and packing.  Daughter had many questions about this.  Explained it numerous different ways to try and satisfy that answer.  Ultimately I wanted to defer to the actual hydrotherapy team to explain how they would manage this.  I noted sometimes it helpful to premedicate with pain medications if they are uncomfortable.  Often the patient feels nothing as the debridement and washing it is in a sensate necrotic tissue.  Sometimes people are uncomfortable during it.  It is a trial and error that the teams work to gradually improve depending on how the patient responds.  My instinct is that the patient may benefit from some premedication since she seems rather agitated and restless, often declining or wishing to delay treatments or interventions.  Patient's daughter against pain control beyond Tylenol, especially worried about using any narcotics as it may be too sedating.  I noted we try not to be too aggressive in the elderly, but we wish to try and minimize pain and discomfort as possible.  Coordinating care with nursing so they can be there to help assess and treat pain or other issues with [place].  I had noted that the wound has been managed and packed.  Noted wound ostomy nurse had come by to see it yesterday.  Daughter says there was no actual examination although notes state otherwise.  We will see with a couple rounds of hydrotherapy & regular wound packing to help clean  this area up.  There is no strong indication for operative surgery.  That have been declined last week by the daughter and patient.  I noted usually surgery follows up 1-2x a week on these chronic wounds.  No strong need for daily evaluations.  Patient's daughter was comfortable with reassessment Monday.  Patient's daughter hopeful to get her mother out soon once the decubitus wound is cleared  up.    I did caution the patient's daughter that this wound will never heal.  The sacral decubitus is permanent at this point.  Decubitus is a reflection of her mother's failure to thrive (not walking and not eating) despite what seems to be aggressive nursing, therapy, multispecialty evaluations and interventions.  Recent 6-week hospitalization with aggressive multispecialty care and evaluations.  Certainly the daughter is trying to be an aggressive advocate for her mother.  Daughter did not want to hear that her mother is declining, and she wished to remain positive only.  Daughter expressed her belief that her mother got poor care at [facility] and the decubitus happened as a result.  I did caution that with her numerous health issues and advanced age. the patient had very little reserve.  Unfortunately, her inability to get out of bed nor eat is a harbinger of further decline in the future.   She is having regular hospitalizations I agree with palliative care and pulmonary critical care of this assessment.  Certainly we will try and help clean up her sacral wound & keep it from being life-threatening.  The patient is not a candidate for any flap closure or any other wound closure intervention.  The best that can be done is to try and keep the area clean with regular wound care.  I allowed the patient's daughter to express her beliefs and ask further questions.  Patient's daughter expressed understanding and appreciation.  Surgery will follow peripherally  Objective:  Vital signs:  Vitals:   02/03/19 1700 02/03/19 1825 02/04/19 0440 02/04/19 0851  BP: (!) 150/80 134/80 137/73 136/79  Pulse: (!) 102 (!) 106 (!) 117 93  Resp: 20  20   Temp:   99.3 F (37.4 C) 98.7 F (37.1 C)  TempSrc:   Oral Axillary  SpO2:  100% 94% 93%  Weight:      Height:        Last BM Date: 02/02/19  Intake/Output   Yesterday:  10/09 0701 - 10/10 0700 In: 380.5 [I.V.:229.3; IV Piggyback:151.2] Out: 900  [Urine:900] This shift:  Total I/O In: -  Out: 500 [Urine:500]    Physical Exam:  General: Pt awake.  Somewhat restless and occasionally agitated/interrupting.  But mostly consolable. Eyes: PERRL, normal EOM.  Sclera clear.  No icterus Neuro: CN II-XII intact w/o focal sensory/motor deficits. Ext:  No deformity.  No mjr edema.  No cyanosis Skin: No petechiae / purpura  Results:   Cultures: Recent Results (from the past 720 hour(s))  SARS CORONAVIRUS 2 (TAT 6-24 HRS) Nasopharyngeal Nasopharyngeal Swab     Status: None   Collection Time: 02/02/19  6:43 PM   Specimen: Nasopharyngeal Swab  Result Value Ref Range Status   SARS Coronavirus 2 NEGATIVE NEGATIVE Final    Comment: (NOTE) SARS-CoV-2 target nucleic acids are NOT DETECTED. The SARS-CoV-2 RNA is generally detectable in upper and lower respiratory specimens during the acute phase of infection. Negative results do not preclude SARS-CoV-2 infection, do not rule out co-infections with other pathogens, and should not be used as the sole basis  for treatment or other patient management decisions. Negative results must be combined with clinical observations, patient history, and epidemiological information. The expected result is Negative. Fact Sheet for Patients: SugarRoll.be Fact Sheet for Healthcare Providers: https://www.woods-mathews.com/ This test is not yet approved or cleared by the [place] FDA and  has been authorized for detection and/or diagnosis of SARS-CoV-2 by FDA under an Emergency Use Authorization (EUA). This EUA will remain  in effect (meaning this test can be used) for the duration of the COVID-19 declaration under Section 56 4(b)(1) of the Act, 21 U.S.C. section 360bbb-3(b)(1), unless the authorization is terminated or revoked sooner. Performed at [facility], [place] [address], [place], [place] [address]   Aerobic Culture (superficial specimen)      Status: None (Preliminary result)   Collection Time: 02/03/19 11:46 AM   Specimen: Sacral; Wound  Result Value Ref Range Status   Specimen Description   Final    SACRAL Performed at [place] [address] [place], [place] [address]    Special Requests   Final    NONE Performed at [facility], [place] [address], [place], [place] [address]    Gram Stain   Final    RARE WBC PRESENT,BOTH PMN AND MONONUCLEAR ABUNDANT GRAM POSITIVE COCCI IN PAIRS ABUNDANT GRAM VARIABLE ROD    Culture   Final    CULTURE REINCUBATED FOR BETTER GROWTH Performed at [facility], [place] [address] [place], [place] [address]    Report Status PENDING  Incomplete    Labs: Results for orders placed or performed during the hospital encounter of 02/02/19 (from the past 48 hour(s))  Comprehensive metabolic panel     Status: Abnormal   Collection Time: 02/02/19  3:38 PM  Result Value Ref Range   Sodium 137 135 - 145 mmol/L   Potassium 3.2 (L) 3.5 - 5.1 mmol/L   Chloride 114 (H) 98 - 111 mmol/L   CO2 13 (L) 22 - 32 mmol/L   Glucose, Bld 85 70 - 99 mg/dL   BUN 18 8 - 23 mg/dL   Creatinine, Ser 0.82 0.44 - 1.00 mg/dL   Calcium 7.8 (L) 8.9 - 10.3 mg/dL   Total Protein 6.5 6.5 - 8.1 g/dL   Albumin 2.3 (L) 3.5 - 5.0 g/dL   AST 41 15 - 41 U/L   ALT 26 0 - 44 U/L   Alkaline Phosphatase 158 (H) 38 - 126 U/L   Total Bilirubin 1.0 0.3 - 1.2 mg/dL   GFR calc non Af Amer >60 >60 mL/min   GFR calc Af Amer >60 >60 mL/min   Anion gap 10 5 - 15    Comment: Performed at [facility], [place] [address], [place],  [address]  CBC with Differential     Status: Abnormal   Collection Time: 02/02/19  3:38 PM  Result Value Ref Range   WBC 13.7 (H) 4.0 - 10.5 K/uL   RBC 3.05 (L) 3.87 - 5.11 MIL/uL   Hemoglobin 9.5 (L) 12.0 - 15.0 g/dL   HCT 30.3 (L) 36.0 - 46.0 %   MCV 99.3 80.0 - 100.0 fL   MCH 31.1 26.0 - 34.0 pg   MCHC 31.4 30.0 - 36.0 g/dL   RDW  18.1 (H) 11.5 - 15.5 %   Platelets 245 150 - 400 K/uL   nRBC 0.0 0.0 - 0.2 %   Neutrophils Relative % 89 %   Neutro Abs 12.2 (H) 1.7 -  7.7 K/uL   Lymphocytes Relative 4 %   Lymphs Abs 0.5 (L) 0.7 - 4.0 K/uL   Monocytes Relative 6 %   Monocytes Absolute 0.8 0.1 - 1.0 K/uL   Eosinophils Relative 0 %   Eosinophils Absolute 0.0 0.0 - 0.5 K/uL   Basophils Relative 0 %   Basophils Absolute 0.0 0.0 - 0.1 K/uL   Immature Granulocytes 1 %   Abs Immature Granulocytes 0.17 (H) 0.00 - 0.07 K/uL    Comment: Performed at [facility], [place] [address], [place], [place] [address]  SARS CORONAVIRUS 2 (TAT 6-24 HRS) Nasopharyngeal Nasopharyngeal Swab     Status: None   Collection Time: 02/02/19  6:43 PM   Specimen: Nasopharyngeal Swab  Result Value Ref Range   SARS Coronavirus 2 NEGATIVE NEGATIVE    Comment: (NOTE) SARS-CoV-2 target nucleic acids are NOT DETECTED. The SARS-CoV-2 RNA is generally detectable in upper and lower respiratory specimens during the acute phase of infection. Negative results do not preclude SARS-CoV-2 infection, do not rule out co-infections with other pathogens, and should not be used as the sole basis for treatment or other patient management decisions. Negative results must be combined with clinical observations, patient history, and epidemiological information. The expected result is Negative. Fact Sheet for Patients: SugarRoll.be Fact Sheet for Healthcare Providers: https://www.woods-mathews.com/ This test is not yet approved or cleared by the [place] FDA and  has been authorized for detection and/or diagnosis of SARS-CoV-2 by FDA under an Emergency Use Authorization (EUA). This EUA will remain  in effect (meaning this test can be used) for the duration of the COVID-19 declaration under Section 56 4(b)(1) of the Act, 21 U.S.C. section 360bbb-3(b)(1), unless the authorization is terminated or revoked  sooner. Performed at [facility], [place] [address], [place], [place] [address]   Lactic acid, plasma     Status: None   Collection Time: 02/02/19  8:05 PM  Result Value Ref Range   Lactic Acid, Venous 1.5 0.5 - 1.9 mmol/L    Comment: Performed at [facility], [place] [address] [place], [place] [id number]  Basic metabolic panel     Status: Abnormal   Collection Time: 02/03/19  2:57 AM  Result Value Ref Range   Sodium 140 135 - 145 mmol/L   Potassium 3.4 (L) 3.5 - 5.1 mmol/L   Chloride 113 (H) 98 - 111 mmol/L   CO2 13 (L) 22 - 32 mmol/L   Glucose, Bld 67 (L) 70 - 99 mg/dL   BUN 18 8 - 23 mg/dL   Creatinine, Ser 0.83 0.44 - 1.00 mg/dL   Calcium 8.1 (L) 8.9 - 10.3 mg/dL   GFR calc non Af Amer >60 >60 mL/min   GFR calc Af Amer >60 >60 mL/min   Anion gap 14 5 - 15    Comment: Performed at [facility], [place] [address] [place],  [address]  CBC     Status: Abnormal   Collection Time: 02/03/19  2:57 AM  Result Value Ref Range   WBC 17.8 (H) 4.0 - 10.5 K/uL   RBC 3.72 (L) 3.87 - 5.11 MIL/uL   Hemoglobin 11.7 (L) 12.0 - 15.0 g/dL   HCT 35.8 (L) 36.0 - 46.0 %   MCV 96.2 80.0 - 100.0 fL   MCH 31.5 26.0 - 34.0 pg   MCHC 32.7 30.0 - 36.0 g/dL   RDW 17.8 (H) 11.5 - 15.5 %   Platelets 193 150 - 400 K/uL  nRBC 0.0 0.0 - 0.2 %    Comment: Performed at [facility], [place] [address] [place], [place] [address]  Hepatic function panel     Status: Abnormal   Collection Time: 02/03/19  2:57 AM  Result Value Ref Range   Total Protein 6.3 (L) 6.5 - 8.1 g/dL   Albumin 2.3 (L) 3.5 - 5.0 g/dL   AST 39 15 - 41 U/L   ALT 25 0 - 44 U/L   Alkaline Phosphatase 144 (H) 38 - 126 U/L   Total Bilirubin 0.9 0.3 - 1.2 mg/dL   Bilirubin, Direct 0.4 (H) 0.0 - 0.2 mg/dL   Indirect Bilirubin 0.5 0.3 - 0.9 mg/dL    Comment: Performed at [facility], [place] [address], [place], [place] [address]  Magnesium     Status: None    Collection Time: 02/03/19  2:57 AM  Result Value Ref Range   Magnesium 1.9 1.7 - 2.4 mg/dL    Comment: Performed at [facility], [place] [address], [place], [place] [address]  Phosphorus     Status: None   Collection Time: 02/03/19  2:57 AM  Result Value Ref Range   Phosphorus 3.4 2.5 - 4.6 mg/dL    Comment: Performed at [facility], [place] [address] [place], [place] [address]  Aerobic Culture (superficial specimen)     Status: None (Preliminary result)   Collection Time: 02/03/19 11:46 AM   Specimen: Sacral; Wound  Result Value Ref Range   Specimen Description      SACRAL Performed at [place] [address] [place], [place] [address]    Special Requests      NONE Performed at [facility], [place] [address], [place], [place] [address]    Gram Stain      RARE WBC PRESENT,BOTH PMN AND MONONUCLEAR ABUNDANT GRAM POSITIVE COCCI IN PAIRS ABUNDANT GRAM VARIABLE ROD    Culture      CULTURE REINCUBATED FOR BETTER GROWTH Performed at [facility], [place] [address] [place], [place] [address]    Report Status PENDING   Glucose, capillary     Status: Abnormal   Collection Time: 02/04/19  1:13 AM  Result Value Ref Range   Glucose-Capillary 141 (H) 70 - 99 mg/dL  Glucose, capillary     Status: Abnormal   Collection Time: 02/04/19  4:36 AM  Result Value Ref Range   Glucose-Capillary 144 (H) 70 - 99 mg/dL  Magnesium     Status: None   Collection Time: 02/04/19  5:40 AM  Result Value Ref Range   Magnesium 1.9 1.7 - 2.4 mg/dL    Comment: Performed at [facility], [place] [address], [place], [place] [address]  Phosphorus     Status: None   Collection Time: 02/04/19  5:40 AM  Result Value Ref Range   Phosphorus 3.0 2.5 - 4.6 mg/dL    Comment: Performed at [facility], [place] [address], [place], [place] [address]  CBC with Differential/Platelet     Status: Abnormal    Collection Time: 02/04/19  5:40 AM  Result Value Ref Range   WBC 23.8 (H) 4.0 - 10.5 K/uL   RBC 3.11 (L) 3.87 - 5.11 MIL/uL   Hemoglobin 9.7 (L) 12.0 - 15.0 g/dL   HCT 29.7 (L) 36.0 - 46.0 %   MCV 95.5 80.0 - 100.0 fL   MCH 31.2 26.0 - 34.0 pg   MCHC 32.7  30.0 - 36.0 g/dL   RDW 17.5 (H) 11.5 - 15.5 %   Platelets 233 150 - 400 K/uL   nRBC 0.0 0.0 - 0.2 %   Neutrophils Relative % 92 %   Neutro Abs 21.9 (H) 1.7 - 7.7 K/uL   Lymphocytes Relative 3 %   Lymphs Abs 0.6 (L) 0.7 - 4.0 K/uL   Monocytes Relative 4 %   Monocytes Absolute 1.0 0.1 - 1.0 K/uL   Eosinophils Relative 0 %   Eosinophils Absolute 0.0 0.0 - 0.5 K/uL   Basophils Relative 0 %   Basophils Absolute 0.0 0.0 - 0.1 K/uL   Immature Granulocytes 1 %   Abs Immature Granulocytes 0.22 (H) 0.00 - 0.07 K/uL    Comment: Performed at [facility], [place] [address], [place], [place] [address]  Comprehensive metabolic panel     Status: Abnormal   Collection Time: 02/04/19  5:40 AM  Result Value Ref Range   Sodium 142 135 - 145 mmol/L   Potassium 3.4 (L) 3.5 - 5.1 mmol/L   Chloride 110 98 - 111 mmol/L   CO2 19 (L) 22 - 32 mmol/L   Glucose, Bld 146 (H) 70 - 99 mg/dL   BUN 20 8 - 23 mg/dL   Creatinine, Ser 0.95 0.44 - 1.00 mg/dL   Calcium 8.1 (L) 8.9 - 10.3 mg/dL   Total Protein 6.5 6.5 - 8.1 g/dL   Albumin 2.4 (L) 3.5 - 5.0 g/dL   AST 35 15 - 41 U/L   ALT 24 0 - 44 U/L   Alkaline Phosphatase 132 (H) 38 - 126 U/L   Total Bilirubin 1.0 0.3 - 1.2 mg/dL   GFR calc non Af Amer 54 (L) >60 mL/min   GFR calc Af Amer >60 >60 mL/min   Anion gap 13 5 - 15    Comment: Performed at [facility], [place] [address] [place], [place] [address]  Glucose, capillary     Status: Abnormal   Collection Time: 02/04/19  7:24 AM  Result Value Ref Range   Glucose-Capillary 132 (H) 70 - 99 mg/dL  Glucose, capillary     Status: Abnormal   Collection Time: 02/04/19 11:27 AM  Result Value Ref Range   Glucose-Capillary  136 (H) 70 - 99 mg/dL    Imaging / Studies: Ct Pelvis W Contrast  Result Date: 02/02/2019 CLINICAL DATA:  Sacral decubitus ulcer. EXAM: CT PELVIS WITH CONTRAST TECHNIQUE: Multidetector CT imaging of the pelvis was performed using the standard protocol following the bolus administration of intravenous contrast. CONTRAST:  36mL OMNIPAQUE IOHEXOL 300 MG/ML  SOLN COMPARISON:  05/02/2018 FINDINGS: Urinary Tract:  No abnormality visualized. Bowel:  Unremarkable visualized pelvic bowel loops. Vascular/Lymphatic: Atheromatous arterial calcifications without aneurysm. No enlarged lymph nodes. Reproductive: Small uterus containing multiple small, densely calcified fibroids. No adnexal mass. Other: Small superficial soft tissue defect posteriorly to the left of the coccyx. This does not extend to the bone and there is no bone destruction or periosteal reaction. Slightly more inferiorly, there is a very small fluid and gas collection, measuring 1.0 x 0.4 cm on image number 35 series 2 and 1.9 cm in length on coronal image number 78. Also noted is diffuse subcutaneous edema. The previously demonstrated bilateral inguinal hernias are no longer seen. Musculoskeletal: No bone destruction or periosteal reaction. Lower lumbar spine degenerative changes. IMPRESSION: 1. Small soft tissue ulcer posteriorly to the left of the coccyx with a 1.9 x 1.0 x 0.4 cm abscess slightly  more inferiorly. 2. No evidence of osteomyelitis. 3. Diffuse subcutaneous edema. Electronically Signed   By: Claudie Revering M.D.   On: 02/02/2019 17:13   US Renal  Result Date: 02/03/2019 CLINICAL DATA:  Urinary retention EXAM: RENAL / URINARY TRACT ULTRASOUND COMPLETE COMPARISON:  CT dated 01/06/2019 FINDINGS: Right Kidney: Renal measurements: 8.8 x 4.2 x 3.4 cm = volume: 64 mL. There is mild hydronephrosis. Left Kidney: Renal measurements: 8.8 x 3.9 x 2.7 cm = volume: 48.7 mL. There is mild hydronephrosis. Bladder: Both ureteral jets were noted. There is  free fluid in the patient's pelvis. IMPRESSION: 1. Mild bilateral hydronephrosis. 2. Both ureteral jets were visualized. 3. Free fluid in the pelvis. Electronically Signed   By: Constance Holster M.D.   On: 02/03/2019 18:15   Dg Chest Port 1 View  Result Date: 02/04/2019 CLINICAL DATA:  Shortness of breath this morning. EXAM: PORTABLE CHEST 1 VIEW COMPARISON:  02/03/2019 FINDINGS: Stable enlarged cardiac silhouette and dense left lower lobe airspace opacity. No significant change in overall amount of patchy opacity in the right upper lobe and both perihilar regions. Stable small right pleural effusion and probable small left pleural effusion. Diffuse osteopenia. IMPRESSION: 1. No significant change in dense left lower lobe atelectasis or pneumonia. 2. No significant change in probable pneumonia in the right upper lobe and both perihilar regions. Alveolar pulmonary edema is less likely in the absence of pulmonary vascular congestion and interstitial pulmonary edema. 3. Stable small right pleural effusion and probable small left pleural effusion. 4. Stable cardiomegaly. Electronically Signed   By: Claudie Revering M.D.   On: 02/04/2019 11:28   Dg Chest Port 1 View  Result Date: 02/03/2019 CLINICAL DATA:  Wheezing. EXAM: PORTABLE CHEST 1 VIEW COMPARISON:  February 02, 2019 FINDINGS: Enlarged cardiac silhouette. Calcific atherosclerotic disease of the aorta. Bilateral moderate in size pleural effusions. Worsening aeration of the lungs with focal airspace opacity in the right upper lobe. Mild pulmonary edema. Osseous structures are without acute abnormality. Soft tissues are grossly normal. IMPRESSION: 1. Bilateral moderate in size pleural effusions. 2. Worsening aeration of the lungs with focal airspace opacity in the right upper lobe. 3. Mild pulmonary edema. 4. Calcific atherosclerotic disease of the aorta. Electronically Signed   By: Fidela Salisbury M.D.   On: 02/03/2019 17:38   Dg Chest Port 1  View  Result Date: 02/02/2019 CLINICAL DATA:  Cough and tachypnea with tachycardia. EXAM: PORTABLE CHEST 1 VIEW COMPARISON:  01/23/2019 FINDINGS: Patient slightly rotated to the left. Lungs are adequately inflated demonstrate hazy bilateral perihilar opacification likely mild interstitial edema. Suggestion of small amount of bilateral pleural fluid. Left effusion improved compared to previous exams. Mild stable cardiomegaly. Remainder of the exam is unchanged. IMPRESSION: Evidence of mild interstitial edema and small bilateral pleural effusions with significant interval improvement in the left effusion. Central lung infection is possible but less likely. Mild stable cardiomegaly. Electronically Signed   By: Marin Olp M.D.   On: 02/02/2019 16:04    Medications / Allergies: per chart  Antibiotics: Anti-infectives (From admission, onward)   Start     Dose/Rate Route Frequency Ordered Stop   02/03/19 1500  ceFAZolin (ANCEF) IVPB 1 g/50 mL premix     1 g 100 mL/hr over 30 Minutes Intravenous Every 8 hours 02/03/19 1444          Note: Portions of this report may have been transcribed using voice recognition software. Every effort was made to ensure accuracy; however, inadvertent computerized transcription errors  may be present.   Any transcriptional errors that result from this process are unintentional.     Adin Hector, MD, FACS, MASCRS Gastrointestinal and Minimally Invasive Surgery    [address], [place], [place] [address] [phone] Main / Paging [phone] Fax

## 2019-02-04 NOTE — Progress Notes (Signed)
PT Hydro therapy Eval   02/04/19 1956  Subjective Assessment  Subjective Pt's dtr present gave most history, mainly intersted in what hydro therapy and pulsed lavage was. Explained at length on process. Pt agreed and dtr agreed.  Patient and Family Stated Goals per dtr "We want mama to get better and be able to walk and this wound will heal"  Date of Onset  (unsure , pt's dtr per chart states that it was present prior to August 2020 hospitalization)  Prior Treatments dressing changes, WOC consult and surgical consult  Evaluation and Treatment  Evaluation and Treatment Procedures Explained to Patient/Family Yes  Evaluation and Treatment Procedures agreed to  Pressure Injury 02/02/19 Buttocks Left Unstageable - Full thickness tissue loss in which the base of the ulcer is covered by slough (yellow, tan, gray, green or brown) and/or eschar (tan, brown or black) in the wound bed.  Date First Assessed/Time First Assessed: 02/02/19 2213   Location: Buttocks  Location Orientation: Left  Staging: Unstageable - Full thickness tissue loss in which the base of the ulcer is covered by slough (yellow, tan, gray, green or brown) and/or e...  Dressing Type Foam - Lift dressing to assess site every shift  Dressing Changed  Dressing Change Frequency Daily  State of Healing Non-healing  Site / Wound Assessment Dusky;Brown  % Wound base Red or Granulating 0%  % Wound base Yellow/Fibrinous Exudate 20%  % Wound base Black/Eschar 20%  % Wound base Other/Granulation Tissue (Comment) 60% (unknown due to tunnelling and under tissue )  Peri-wound Assessment Erythema (non-blanchable);Purple (per-wound extends another 3-4 centimeters in all areas)  Wound Length (cm) 3 cm  Wound Width (cm) 3 cm  Wound Depth (cm) 1 cm  Wound Surface Area (cm^2) 9 cm^2  Wound Volume (cm^3) 9 cm^3  Tunneling (cm) Yes, 2 cm in all areas except for  6:00 to 9:00 with gfreatness tunelling down to sacrum and 3rd small hole all connected    Undermining (cm) 2-3 cm all areas   Margins Unattached edges (unapproximated)  Drainage Amount Minimal  Drainage Description Odor;Purulent  Treatment Debridement (Selective);Cleansed;Hydrotherapy (Pulse lavage);Packing (Impregnated strip) (packing with idodeform as instructed by [facility] nurse )  Hydrotherapy  Pulsed lavage therapy - wound location sacrum   Pulsed Lavage with Suction (psi) 8 psi (mostly 4 at times due to pain )  Pulsed Lavage with Suction - Normal Saline Used 1000 mL  Pulsed Lavage Tip Tip with splash shield  Selective Debridement  Selective Debridement - Location sacrum   Selective Debridement - Tools Used Forceps  Selective Debridement - Tissue Removed stringy sloth  Wound Therapy - Assess/Plan/Recommendations  Wound Therapy - Clinical Statement Pt with significant decline since August . Per pt dtr, pt was independent prior to August-Oct hospitalization at [facility]. However pt has become very weak, resistance to movment in extension pattern, and difficult to mobilize. Due to poor moblility and nutrition this wound will be difficult to heal. Pt's dtr with postiive spririts hoping for her mother to walk again and wound to heal. Need education for pressure relief, mobility and nutrition to assist in pulse lavage, debridement and dressing changes for healing.   Wound Therapy - Functional Problem List decreasaed mobility and nutrition   Factors Delaying/Impairing Wound Healing Immobility;Multiple medical problems  Hydrotherapy Plan Debridement;Dressing change;Patient/family education;Pulsatile lavage with suction  Wound Therapy - Frequency 6X / week  Wound Therapy - Current Recommendations Case manager/social work;PT;OT  Wound Therapy - Follow Up Recommendations Skilled nursing facility (or home depending  on pt's family being able to give total ca)  Wound Therapy Goals - Improve the function of patient's integumentary system by progressing the wound(s) through the phases of wound  healing by:  Decrease Necrotic Tissue to 50  Decrease Necrotic Tissue - Progress Goal set today  Increase Granulation Tissue to 50  Increase Granulation Tissue - Progress Goal set today  Improve Drainage Characteristics Min (with decreased odor )  Improve Drainage Characteristics - Progress Goal set today  Patient/Family will be able to  help position patient for relief on sacrum area   Patient/Family Instruction Goal - Progress Goal set today  Goals/treatment plan/discharge plan were made with and agreed upon by patient/family Yes (with patient's dtr )  Time For Goal Achievement 2 weeks  Wound Therapy - Potential for Goals Fair   Clide Dales, PT Acute Rehabilitation Services Pager: [phone] Office: [phone] 02/04/2019

## 2019-02-04 NOTE — Progress Notes (Signed)
  Echocardiogram 2D Echocardiogram has been performed.  Sydney [name] 02/04/2019, 9:25 AM

## 2019-02-04 NOTE — Progress Notes (Signed)
SLP Cancellation Note  Patient Details Name: Sydney [name] MRN: [id number] DOB: 06-14-1930   Cancelled treatment:       Reason Eval/Treat Not Completed: Other (comment). Visited pt at bedside after hydrotherapy. Daughter repoted pt was very fatigued. SLP observed daughter offer pt sips of peach breeze with additional supplement added, but pt was very anxious. She would not allow total assist feeding and could not feed herself due to anxiety. SHe is noted to have a congested weak cough without mobilization of secretions. Mouth and lips are severely dry. Intake is expected to be poor and insufficient for any type of progress. Discussed value of keeping pts mouth hydrated, for successful swallowing and ability to mobilize and manage secretions. We also discussed risk of aspiration and basic precuations. Daughter may continue to feed pt. Will continue efforts to attempt swallow evaluation in the next 1-2 days.   Herbie Baltimore, MA CCC-SLP  Acute Rehabilitation Services Pager [phone] Office [phone]   Lynann Beaver 02/04/2019, 4:12 PM

## 2019-02-04 NOTE — Consult Note (Signed)
Consultation Note Date: 02/04/2019   Patient Name: Sydney [name]  DOB: 06/28/1930  MRN: [id number]  Age / Sex: 85 y.o., female  PCP: [name], MD Referring Physician: Kerney Elbe, DO  Reason for Consultation: Establishing goals of care  HPI/Patient Profile: 85 y.o. 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  Of note, palliative medicine team had seen Sydney [name] last admission and her daughter requested no further follow-up for our team nor any further discussions of hospice.  Since admission, multiple subspecialties have been involved in her care, including surgery, ID, GI, and pulmonary.  Palliative consulted for goals of care.  Clinical Assessment and Goals of Care: I saw and examined [name].  She was lying in bed and complained of pain, but otherwise was not really able to tell me much about her condition or clinical course of the past several months.  I called and was able to reach her daughter, Sydney [name].  I introduced palliative care as specialized medical care for people living with serious illness. It focuses on providing relief from the symptoms and stress of a serious illness. The goal is to improve quality of life for both the patient and the family.  Sydney [name] had been clear during past hospitalization that she did not want want palliative care to be part of her mother's care moving forward.  Immediately, Sydney [name] was clear to note that her goal for her mother is, "restoration."  She states that if any proposed discussion or intervention is not focused on her mother regaining her prior functional status that she is not interested in discussing.  I briefly discussed with her my concern about  decline in her mother's nutrition, cognition, and functional status.  She expressed understanding this, but remains steadfast in her hope that her mother will continue to improve.  She is very clear that she is not interested in hospice services at this time, and states that she does not think the palliative care is the right focus for her to continue to work towards " restoration."  I talked with Sydney [name] about the differences between palliative care and hospice, and I assured her that my goal was to support her and her mother rather than any other agenda.  I let her know that with a plan to discharge soon, her mother may be well served by outpatient palliative care following them.  Her mother appears to be [facility] ACO eligible.  With this, she should be able to receive palliative care as an outpatient through care connections with [facility] [place].  I placed a letter with the website for care connections in her mother's room for her to pick up.  My recommendation was to look further at the information on their website to see if she believes this would be of benefit to her mom.  SUMMARY OF RECOMMENDATIONS   -  Full scope treatment -Patient's daughter is fully invested in plan to continue with any and all offered interventions. -She is clear that she does not want to discuss hospice support for her mother and really does not want further involvement from palliative care at this time, either.  She does not desire further follow-up from our team this hospitalization.  I have provided my contact information and she will call if she is open to further meetings. -I placed a note with information regarding outpatient palliative care services through [facility] [place].  As she is [facility] eligible, she should be able to receive care through care connections program if so desired. - No further palliative specific recommendations at this time.  Please call if there are needs with which we can be of  assistance.  Code Status/Advance Care Planning:  Full code  Prognosis:   At this time, goal remains for continued aggressive interventions.  If her goals were to change to a focus on comfort (they are clearly not this at this time), her prognosis with a focus on comfort would be less than 6 months and she would be eligible for hospice services at any time moving forward if family desired to forgo further aggressive therapies and elect her hospice benefits..  Discharge Planning: Home with Home Health      Primary Diagnoses: Present on Admission:  Sacral ulcer ([place])   I have reviewed the medical record, interviewed the patient and family, and examined the patient. The following aspects are pertinent.  Past Medical History:  Diagnosis Date   Anemia    years ago   Aortic atherosclerosis ([place])    Arthritis    "left hand" (02/17/2013)   Bilateral inguinal hernia    INCIDENTAL CT 1/20   C. difficile diarrhea    Cataract    Dehydration 12/29/2018   Diplopia    CRANIAL 6 NERVE PALSY   Dizzy    Dyslipidemia    Gastritis    Glaucoma    High cholesterol    "on RX years ago" (02/17/2013)   Hypertension    Leukopenia    Meningioma ([place]) 05/01/2015   Osteoporosis 02/2014   T score -2.5  followed by Dr. Lysle Rubens   Palpitations    PACs, PVCs and short runs of atrial tachycardia on Holter monitoring   PVD (peripheral vascular disease) ([place])    Rhinitis    Syncopal episodes 2003   TIA (transient ischemic attack) 1990's   Trigger thumb of left hand    Weakness 12/29/2018   Winter itch    Social History   Socioeconomic History   Marital status: Widowed    Spouse name: Not on file   Number of children: 1   Years of education: 71   Highest education level: Not on file  Occupational History   Occupation: retired  Scientist, product/process development strain: Not on file   Food insecurity    Worry: Not on file    Inability: Not on Energy manager needs    Medical: Not on file    Non-medical: Not on file  Tobacco Use   Smoking status: Never Smoker   Smokeless tobacco: Never Used  Substance and Sexual Activity   Alcohol use: No   Drug use: No   Sexual activity: Not Currently    Comment: 1st intercourse 85 yo-Fewer than 5 partners  Lifestyle   Physical activity    Days per week: Not on file    Minutes  per session: Not on file   Stress: Not on file  Relationships   Social connections    Talks on phone: Not on file    Gets together: Not on file    Attends religious service: Not on file    Active member of club or organization: Not on file    Attends meetings of clubs or organizations: Not on file    Relationship status: Not on file  Other Topics Concern   Not on file  Social History Narrative   Patient does not drink caffeine.   Patient is right handed.    Family History  Problem Relation Age of Onset   Hypertension Father    Heart attack Father    Stroke Brother    Stroke Maternal Grandmother    Scheduled Meds:  aspirin EC  81 mg Oral Daily   clopidogrel  75 mg Oral Daily   feeding supplement (ENSURE ENLIVE)  237 mL Oral BID BM   folic acid  1 mg Oral Daily   Gerhardt's butt cream  1 application Topical TID   guaiFENesin  1,200 mg Oral BID   ipratropium  0.5 mg Nebulization Q6H   latanoprost  1 drop Both Eyes QHS   levalbuterol  0.63 mg Nebulization Q6H   megestrol  400 mg Oral TID   mirtazapine  45 mg Oral QHS   multivitamin with minerals  1 tablet Oral Daily   nutrition supplement (JUVEN)  1 packet Oral BID BM   nystatin  5 mL Oral QID   nystatin cream   Topical BID   pantoprazole  20 mg Oral Daily   Continuous Infusions:   ceFAZolin (ANCEF) IV 1 g (02/04/19 0737)   potassium chloride 10 mEq (02/04/19 1040)   PRN Meds:.acetaminophen **OR** acetaminophen, loperamide Medications Prior to Admission:  Prior to Admission medications   Medication Sig Start  Date End Date Taking? Authorizing Provider  aspirin (ECOTRIN LOW STRENGTH) 81 MG EC tablet Take 81 mg by mouth daily. Swallow whole.   Yes [provider]  cholecalciferol (VITAMIN D) 1000 units tablet Take 1,000 Units by mouth daily.   Yes [provider]  clopidogrel (PLAVIX) 75 MG tablet Take 75 mg by mouth daily.   Yes [provider]  folic acid (FOLVITE) 1 MG tablet Take 1 mg by mouth daily.   Yes [provider]  Hydrocortisone (GERHARDT'S BUTT CREAM) CREA Apply 1 application topically 3 (three) times daily. Apply barrier cream to buttocks/sacrum TID and PRN with each incontinent cleaning session 01/31/19 03/02/19 Yes Ghimire, Henreitta Leber, MD  latanoprost (XALATAN) 0.005 % ophthalmic solution Place 1 drop into both eyes at bedtime.    Yes [provider]  loperamide (IMODIUM) 2 MG capsule Take 1 capsule (2 mg total) by mouth as needed for diarrhea or loose stools. 01/31/19  Yes Ghimire, Henreitta Leber, MD  megestrol (MEGACE) 400 MG/10ML suspension Take 10 mLs (400 mg total) by mouth 3 (three) times daily. 01/31/19 03/02/19 Yes Ghimire, Henreitta Leber, MD  mirtazapine (REMERON SOL-TAB) 45 MG disintegrating tablet Take 1 tablet (45 mg total) by mouth at bedtime. 01/31/19  Yes Ghimire, Henreitta Leber, MD  Multiple Vitamin (MULTIVITAMIN WITH MINERALS) TABS tablet Take 1 tablet by mouth daily. 02/01/19  Yes Ghimire, Henreitta Leber, MD  nystatin (MYCOSTATIN) 100000 UNIT/ML suspension Take 5 mLs (500,000 Units total) by mouth 4 (four) times daily as needed for up to 10 days (thrush). 01/31/19 02/10/19 Yes Ghimire, Henreitta Leber, MD  nystatin (MYCOSTATIN/NYSTOP) powder Apply  topically 2 (two) times daily. Apply to affected area on the skin folds 01/31/19  Yes Ghimire, Henreitta Leber, MD  pantoprazole (PROTONIX) 20 MG tablet Take 20 mg by mouth daily. 12/09/18  Yes [provider]  traMADol (ULTRAM) 50 MG tablet Take 0.5 tablets (25 mg total) by mouth every 12 (twelve) hours as needed for  moderate pain. 01/31/19  Yes Ghimire, Henreitta Leber, MD  Amino Acids-Protein Hydrolys (FEEDING SUPPLEMENT, PRO-STAT SUGAR FREE 64,) LIQD Take 30 mLs by mouth 2 (two) times daily with a meal. Patient not taking: Reported on 02/02/2019 01/31/19 03/02/19  Jonetta Osgood, MD   Allergies  Allergen Reactions   Demerol [Meperidine] Other (See Comments)    Excessive sweating, cramping   Other     No Antibiotic without consultation with her primary physician.   Codeine Rash   Sulfa Antibiotics Itching and Other (See Comments)    Reaction unknown   Tomato Itching   Review of Systems Unable to assess  Physical Exam  General: Alert, frail, appears uncomfortable.  Heart: Regular rate and rhythm. No murmur appreciated. Lungs: Decreased air movement, Rhonchi, weak cough Abdomen: Soft, nontender, nondistended, positive bowel sounds.  Ext: No significant edema Skin: Warm and dry, wounds not examined  Vital Signs: BP 136/79 (BP Location: Left Arm)    Pulse 93    Temp 98.7 F (37.1 C) (Axillary)    Resp 20    Ht 5\' 6"  (1.676 m)    Wt 52.2 kg    SpO2 93%    BMI 18.56 kg/m  Pain Scale: PAINAD   Pain Score: Asleep   SpO2: SpO2: 93 % O2 Device:SpO2: 93 % O2 Flow Rate: .O2 Flow Rate (L/min): 3 L/min  IO: Intake/output summary:   Intake/Output Summary (Last 24 hours) at 02/04/2019 1121 Last data filed at 02/04/2019 0451 Gross per 24 hour  Intake 380.49 ml  Output 900 ml  Net -519.51 ml    LBM: Last BM Date: 02/02/19 Baseline Weight: Weight: 52.2 kg Most recent weight: Weight: 52.2 kg     Palliative Assessment/Data:     Time In: 1000 Time Out: 1100 Time Total: 60 Greater than 50%  of this time was spent counseling and coordinating care related to the above assessment and plan.  Signed by: Micheline Rough, MD   Please contact Palliative Medicine Team phone at [phone] for questions and concerns.  For individual provider: See Shea Evans

## 2019-02-04 NOTE — Progress Notes (Signed)
PROGRESS NOTE    Sydney [name]  TML:[id number] DOB: 07-12-1930 DOA: 02/02/2019 PCP: [name], MD  Brief Narrative:  HPI per Dr. Orene Desanctis on 02/02/2019 Sydney [name] is a 85 y.o. 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  Daughter who is her HCPOA provides most of the history.  Patient was recently hospitalized from 12/19/2018 and discharged 2 days ago on 10/6.  She was admitted for progressive weakness, malaise, fatigue and significant failure to thrive.  She also developed encephalopathy and worsening liver enzymes.  She had an extensive work-up and was seen by numerous subspecialists including ID, GI, oncology and neurology without any significant improvement in her failure to thrive syndrome.  She was discharged home with home health given daughter was resistant to SNF and hospice care.  Since discharge, daughter reports that she has not had any acute changes.  Her appetite continues to be poor.  Daughter was helping her to get supplemental home health nursing and had a nurse come evaluate her.  The nurse was concerned about wheezing on her respiratory exam and her sacral wound prompting her to come into the ED.  Daughter was unsure whether she was hypoxic at home.  Denies any fever, cough or shortness of breath.  Denies any chest pain.  Denies any nausea, vomiting or abdominal pain.  Patient has chronic diarrhea from her history of C. difficile with stool transplant and daughter has noticed increased frequency of her bowel movements. Daughter would like her to get her sacral wound care for and her goal was for her mother to be able to ambulate on her own. She does not want her mother to receive any narcotics or opioids specifically morphine and tramadol since she felt it sedated her mother during prior admission.  Also does not want Ativan.  Daughter states that she strongly wants [place] to consult infectious disease and GI prior to starting any antibiotics given her history.  ED Course: She was afebrile and had elevated blood pressure up to 164/100.  Also tachycardic up to 120s. CBC showed leukocytosis of 13.7 and hemoglobin of 9.5 which is around her baseline.  CMP showed potassium of 3.2, mildly elevated alkaline phosphatase 158 which is around her baseline.  Chest x-ray showed evidence of mild interstitial edema and small bilateral pleural effusion with significant interval improvement in the left effusion.  Central lung infection is possible but less likely.  Mild stable cardiomegaly. CT pelvis showed small soft tissue ulcer posterior to the left of the coccyx with a 1.9 x 1 x 0.4 cm abscess slightly more inferior.  No evidence of osteomyelitis.  Diffuse subcutaneous edema.  **Interim History General surgery was consulted for further evaluation of her sacral wound and abscess.  Patient's daughter would not let me put her on any antibiotics unless I spoke with ID so ID was consulted and as a courtesy GI was consulted as well and Dr. Carlean Purl saw the patient briefly and talk with her daughter as a Manufacturing engineer.  Antibiotics were started with IV cefazolin and cultures were sent as her wound abscess was spontaneously draining.  Patient started having some respiratory distress likely due to intermittent mucous plugging because of her failure to thrive and ability to cough up her secretions as well as mild volume overload so Pulmonary was consulted for further evaluation she is transferred to telemetry for continuous pulse oximetry  monitoring.    Flutter valve is recommended however patient daughter refused and repositioning refused.  Patient's daughter is only requesting the patient be given Tylenol.  Ethics was consulted for further evaluation as guidance was sought and [employer] spoke to me at length and recommended discharging the patient home as  soon as possible pending culture results and having a Education officer, museum come out to evaluate no living situation.  Patient is incredibly frail debilitated and has severe failure to thrive and has had significant poor p.o. intake.  She is visibly in pain despite this but daughter is refusing medications for the patient.  An appropriate approach would be to transition the patient to comfort measures however patient daughter declined.  I reconsulted palliative care for further evaluation and recommendations and Dr. Domingo Cocking met with the patient and the daughter and they are to continue full scope of treatment currently.  Patient's daughter did not want to discuss hospice support for mother and did not want any further involvement from a palliative care team either but Dr. Domingo Cocking provided contact information and provided a note regarding the outpatient palliative services to the hospital at [facility].  Patient's daughter continues to have unrealistic goals and has not come to terms with the patient's mortality and feels that the patient can get better and walk and have her wound care healed and get back to her normal activities of daily living despite her continued failure to thrive and high risk for aspiration.    General surgery evaluated and had a lengthy discussion with the patient's daughter that her sacral wound and Dr. gross felt that the wound will never heal given her nutritional status and failure to thrive despite nursing interventions multidisciplinary evaluations.  The daughter continues not to want to hear bad news and refuses to believe that her mother is this sick and wanted to focus on her mother's "restoration".  I brought up the fact that the patient had a history of heart failure and the patient's daughter refused to believe this even though this is documented back in her echocardiogram in 2016.  Patient's daughter refused to believe that she had a diagnosis of heart failure and felt that there is  nothing wrong with the patient's heart even though I described that she could get volume overloaded given her poor ventricular relaxation due to her diastolic heart failure.  She continues to believe that the patient came to bounce back from her significant weakness and get to a prior level where she was independent of her ADLs.  Assessment & Plan:   Principal Problem:   Failure to thrive in adult Active Problems:   Protein calorie malnutrition (HCC)   Ataxia   MDD (major depressive disorder)   Sacral ulcer (HCC)   SOB (shortness of breath)   Wheezing   TIA (transient ischemic attack)   PVD (peripheral vascular disease) (HCC)  Failure to thrive/deconditioning/dehydration with Hypoglycemia from poor po intake, worsening -Patient had extensive work-up without any findings with numerous specialist at last admission. Reportedly felt that patient has met maximal benefit during last hospital stay and was discharged and Ethics involved.  -Daughter declined NG or PEG tube (not a candidate after discussion with GI) and refused hospice at discharge. Ethics committee was noted to be involved. See last d/c note for full details.  -Pt was tachycardiac but likely secondary to dehydration. No fever but Leukocytosis is worsening and could be in the setting of IV steroid demargination possible aspiration -Consult dietician - SLP for  swallow study- pt high risk for aspiration will allow for dysphagia 3 diet and have the daughter only feed the patient; speech therapy evaluated and started the patient was very fatigued and anxious and with her mouth and lips being severely dry intake was expected to be poor and insufficient fo any type of progress.  Speech therapy will continue efforts to attempt swallow evaluation in next 1 to 2 days and will continue as I have the patient's daughter feed the patient with a dysphagia 3 diet with basic precautions and risk of aspirations were discussed given that the patient has  been very poor cough reflex and cannot really mobilize her secretions -Wound care for her sacral wound -PT/OT recommending Home Health as daughter refusing SNF -Continue Megace -Started IVF hydration with IV Sodium Bicarbonate at 50 mL/hr Given concern for volume overload but this was now stopped and given a dose of IV Lasix given her likely volume overload  Acute Respiratory Failure with Hypoxia and Cough in setting of known diastolic dysfunction along with poor mobilization of secretions from failure to thrive and generalized weakness -CXR was read as"Enlarged cardiac silhouette. Calcific atherosclerotic disease of the aorta. Bilateral moderate in size pleural effusions. Worsening aeration of the lungs with focal airspace opacity in the right upper lobe. Mild pulmonary edema. Osseous structures are without acute abnormality. Soft tissues are grossly normal." by the Radiologist -Given IV Lasix 20 mg x 2 yesterday and will give IV Lasix 40 g x 1 today -Pulmonary feels Pleural effusions are small and that she has vascular congestion that is probably contributing to her intermittent Hypoxemia but Dr. Lamonte Sakai believes that the cause of this is likely related to some degree of diastolic dysfunction but more so malnutrition and poor oncotic pressures as well as possibly noncardiac inflammatory edema due to ongoing infection or intermittent aspiration.  He suspects that the biggest contributor to her recent acute change in oxygen need relation to her impaired airway protection, mucus and secretion management given her malnourishment nonhealing wound status and progressive weakness; I have personally reviewed her chest x-ray and I am in agreement with Dr. Lamonte Sakai -Given Acidosis c/w Gentle IVF at 72 mL's per hour this is now stopped and patient was given a dose of IV Lasix -Flutter Valve, Incentive Spirometry, and Guaifenesin -Temporizing measures with suctioning and pulmonary hygiene -Continue with speech  therapy and aspiration precautions and will allow for dysphagia 3 diet with the daughter knowing that she can likely aspirate -Pulmonary feels no definitive role for CT imaging and do not feel that that to make any remarkable change and improvement on his nutritional status is improved, strength building and physical therapy along with adequate management of her underlying infection and Dr. Corky Sox expects progressive weakening, worsening airway protection and aspiration -Pulmonary has recommended a comfort based approach and the daughter has adamantly refused -We will diurese and continue the watch patient's respiratory status along with gentle fluid hydration given her poor p.o. intake -Repeat echocardiogram today -If necessary we will discharge the patient with oxygen at home; she was on 4 L of oxygen via nasal cannula today -Case was discussed with [place] who feels that the most appropriate course of action would be to find out what culture she is going to treat her with p.o. antibiotics and discharge her home with oxygen if necessary knowing that she will likely come back to the hospital given the daughter's opposition to hospice and comfort based measures  -Patient should really transition to  comfort care measures at this time however patient daughter is adamant and opposed and will stabilize the patient and after he can be discharged home once cultures are resulted -Repeat chest x-ray today showed "No significant change in dense left lower lobe atelectasis or pneumonia. No significant change in probable pneumonia in the right upper lobe and both perihilar regions. Alveolar pulmonary edema is less likely in the absence of pulmonary vascular congestion and interstitial pulmonary edema. Stable small right pleural effusion and probable small left pleural effusion. Stable cardiomegaly." -Continue with supplemental oxygen via nasal cannula and wean O2 as tolerated -Started the patient on IV  Flagyl for aspiration based on ID recommendations and will continue for now -To monitor respiratory status carefully  Metabolic Acidosis -In the setting of dehydration and poor p.o. intake Patient CO2 of 13, anion gap was 14 and chloride levels 113 -Now CO2 is 19, anion gap is 13, and chloride is 100. -IV fluids as above now stopped -Continue monitor and trend repeat CMP in a.m.  Acute Urinary Retention -I and O Cath today however she may require a Foley catheter as she is required several and will cath -Give Lasix x2 today and will repeat today at 40 mg -Renal U/S done and showed mild hydronephrosis and I spoke with urology Dr. Lovena Neighbours who recommends continuing current plan of care and no further intervention with stenting necessary -Continue to Monitor Strict I's and O's -If patient continues to need INO cath overnight will require Foley catheter placement  Sacral Decubitus Ulcer Stage4 with a Tract and Now Abscess -Continue local wound care-appreciate wound care RN input and Surgical Evaluation -She continues to have discomfort at the sacral site and Daughter refuses any medication but Tylenol -Cx's sent and they have been reintubated for further growth -Started Empiric Abx with Cefazolin per ID Recc's -Initial Gram Stain showed "RARE WBC PRESENT,BOTH PMN AND MONONUCLEAR  ABUNDANT GRAM POSITIVE COCCI IN PAIRS  ABUNDANT [place]" with Cx's Pending  -Hydrotherapy recommended by General Surgery and this is to be done today -Dr. gross had a extremely long discussion with the patient's daughter about the wound and her prognosis and daughter did not want to hear any bad news and only wanted to focus on the positive" speak life" over the patient  Deconditioning/Debility -Secondary to prolonged hospitalization/acute illness-and underlying failure to thrive syndrome.   -PT evaluated the patient recommending Home Health as patient's daughter will not take her to SNF -Prognosis is  extremely poor patient is going to become weaker and more debilitated with her poor p.o. intake and she has a very high risk of inpatient decompensation and worsening  Severe Malnutrition in the Context of Chronic Illness -Nutritionist consulted for further evaluation and recommendations -Nutritionist recommended ordering Ensure Enlive BID, Magic Cup BID, and Juven BID and recommending Floor staff to assist with feeding as needed if daughter is not present   Hypokalemia  -K+ was 3.4 -Replete with IV KCl again today -Checked Mag Level and was 1.9 -Continue to Monitor and Replete as Necessary -Repeat CMP in AM   Oral thrush -Nystatin suspension continue  Normocytic Anemia -Hemoglobin of 9.5 on admission and now Hgb/Hct is 11.7/35.8 but repeat this morning was 9.7/29.7 stable with no signs of bleeding. -Continue to monitor CBC  HTN -stable without antihypertensive  History of C. difficile colitis s/p stool transplantation -Continues to have loose stool and daughter endorse increased frequency in the past few days.  However, she does not have a fever or  abdominal pain to suggest recurrent C. difficile colitis -Infectious disease recommendation at last admission was to continue Imodium as needed -GI has no hesitation using Abx per Dr. Celesta Aver note and she is now on ceftriaxone and IV Flagyl  History of TIA -Continue aspirin and Plavix   Leukocytosis -Worsening and likely in the setting of steroid demargination but also could be from aspiration and possible aspiration pneumonia next-patient is on IV cefazolin and we will add IV Flagyl based on ID recommendation -Continue monitor and trend WBC -Repeat CBC in a.m. and continue to monitor for signs and symptoms of infection  DVT prophylaxis: SCDs Code Status: FULL CODE Family Communication: Discussed with Daughter at Bedside Disposition Plan: Anticipate D/C Home in the next 24-48 hours pending Cx results  Consultants:    General Surgery Dr. Johney Maine  Infectious Diseases  Pulmonary  Discussed with Gastroenterology Dr. Carlean Purl  Discussed with Palliative Care Dr. Hilma Favors; formal consultation with Dr. Harrison Mons -Spoke with Idolina Primer   Procedures: None   Antimicrobials:  Anti-infectives (From admission, onward)   Start     Dose/Rate Route Frequency Ordered Stop   02/04/19 1400  metroNIDAZOLE (FLAGYL) IVPB 500 mg     500 mg 100 mL/hr over 60 Minutes Intravenous Every 8 hours 02/04/19 1343     02/03/19 1500  ceFAZolin (ANCEF) IVPB 1 g/50 mL premix     1 g 100 mL/hr over 30 Minutes Intravenous Every 8 hours 02/03/19 1444       Subjective: Patient seen and examined at bedside and she has been intermittently confused but had periods of lucidity.  Did not like being turned and continued to have pain.  I spoke with the daughter over the telephone as she continues to have the expectation and believes that her mother will become well enough to walk again and have her sacral ulcer healed despite multiple physicians telling her otherwise.  She dismissed the primary care physician today and did not want to speak anything about hospice or have palliative care see the patient again.  Patient had very minimal p.o. intake yesterday and had some soup from what the daughter states and we have allowed for dysphagia 3 diet with the daughter knowing the risk of taking highly aspirate given her a weak respiratory mechanics.  Patient continued to have some pain but daughter only requested Tylenol be given again today.  Dr. gross of general surgery evaluated and had a very lengthy discussion about the patient's failure to thrive and sacral ulcer and patient is to start hydrotherapy today.  Objective: Vitals:   02/04/19 1405 02/04/19 1430 02/04/19 2010 02/04/19 2040  BP:   126/72   Pulse:   92   Resp:   20   Temp:   98.9 F (37.2 C)   TempSrc:   Oral   SpO2: 100% 96% 97% 96%  Weight:      Height:         Intake/Output Summary (Last 24 hours) at 02/04/2019 2050 Last data filed at 02/04/2019 1436 Gross per 24 hour  Intake -  Output 1500 ml  Net -1500 ml   Filed Weights   02/02/19 1345  Weight: 52.2 kg   Examination: Physical Exam:  Constitutional: Thin and cachectic frail elderly African-American female who appears agitated and uncomfortable does not like to be turned Eyes: Lids and conjunctivae normal, sclerae anicteric  ENMT: External Ears, Nose appear normal. Grossly normal hearing. Mucous membranes are extremely dry Neck: Appears normal, supple, no cervical masses,  normal ROM, no appreciable thyromegaly; no JVD Respiratory: Diminished to auscultation bilaterally with coarse breath sounds and some trace wheezing.  Patient unable to cough or clear her cough, no wheezing, rales, rhonchi or crackles. Normal respiratory effort and patient is not tachypenic. No accessory muscle use.  Wearing 4 L of supplemental oxygen via nasal cannula Cardiovascular: RRR, no murmurs / rubs / gallops. S1 and S2 auscultated.  No appreciable lower extremity edema. Abdomen: Soft, non-tender, non-distended. Bowel sounds positive.  GU: Deferred. Musculoskeletal: No clubbing / cyanosis of digits/nails. No joint deformity upper and lower extremities.  Skin: As a sacral decubitus ulcer with a communicating tract that is covered today but I did not unbandaged as the nurses had just packed it Neurologic: CN 2-12 grossly intact with no focal deficits. Romberg sign and cerebellar reflexes not assessed.  Psychiatric: Slightly impaired judgment and insight. Alert and oriented x 2.  Mildly agitated mood and appropriate affect.   Data Reviewed: I have personally reviewed following labs and imaging studies  CBC: Recent Labs  Lab 01/29/19 1503 02/02/19 1538 02/03/19 0257 02/04/19 0540  WBC 7.3 13.7* 17.8* 23.8*  NEUTROABS  --  12.2*  --  21.9*  HGB 10.6* 9.5* 11.7* 9.7*  HCT 33.3* 30.3* 35.8* 29.7*  MCV 100.3*  99.3 96.2 95.5  PLT 257 245 193 790   Basic Metabolic Panel: Recent Labs  Lab 01/29/19 0855 01/31/19 0355 02/02/19 1538 02/03/19 0257 02/04/19 0540  NA 135 133* 137 140 142  K 4.1 3.6 3.2* 3.4* 3.4*  CL 113* 108 114* 113* 110  CO2 14* 15* 13* 13* 19*  GLUCOSE 98 75 85 67* 146*  BUN _0 CREATININE 0.75 0.81 0.82 0.83 0.95  CALCIUM 7.6* 7.9* 7.8* 8.1* 8.1*  MG 2.1  --   --  1.9 1.9  PHOS  --   --   --  3.4 3.0   GFR: Estimated Creatinine Clearance: 35 mL/min (by C-G formula based on SCr of 0.95 mg/dL). Liver Function Tests: Recent Labs  Lab 01/29/19 0855 01/31/19 0355 02/02/19 1538 02/03/19 0257 02/04/19 0540  AST 51* 39 41 39 35  ALT 34 _1 ALKPHOS 159* 161* 158* 144* 132*  BILITOT 1.1 1.0 1.0 0.9 1.0  PROT 5.8* 5.9* 6.5 6.3* 6.5  ALBUMIN 1.9* 2.0* 2.3* 2.3* 2.4*   No results for input(s): LIPASE, AMYLASE in the last 168 hours. No results for input(s): AMMONIA in the last 168 hours. Coagulation Profile: No results for input(s): INR, PROTIME in the last 168 hours. Cardiac Enzymes: No results for input(s): CKTOTAL, CKMB, CKMBINDEX, TROPONINI in the last 168 hours. BNP (last 3 results) No results for input(s): PROBNP in the last 8760 hours. HbA1C: No results for input(s): HGBA1C in the last 72 hours. CBG: Recent Labs  Lab 02/04/19 0113 02/04/19 0436 02/04/19 0724 02/04/19 1127 02/04/19 1602  GLUCAP 141* 144* 132* 136* 119*   Lipid Profile: No results for input(s): CHOL, HDL, LDLCALC, TRIG, CHOLHDL, LDLDIRECT in the last 72 hours. Thyroid Function Tests: No results for input(s): TSH, T4TOTAL, FREET4, T3FREE, THYROIDAB in the last 72 hours. Anemia Panel: No results for input(s): VITAMINB12, FOLATE, FERRITIN, TIBC, IRON, RETICCTPCT in the last 72 hours. Sepsis Labs: Recent Labs  Lab 02/02/19 2005  LATICACIDVEN 1.5    Recent Results (from the past 240 hour(s))  SARS CORONAVIRUS 2 (TAT 6-24 HRS) Nasopharyngeal Nasopharyngeal Swab      Status: None   Collection Time: 02/02/19  6:43  PM   Specimen: Nasopharyngeal Swab  Result Value Ref Range Status   SARS Coronavirus 2 NEGATIVE NEGATIVE Final    Comment: (NOTE) SARS-CoV-2 target nucleic acids are NOT DETECTED. The SARS-CoV-2 RNA is generally detectable in upper and lower respiratory specimens during the acute phase of infection. Negative results do not preclude SARS-CoV-2 infection, do not rule out co-infections with other pathogens, and should not be used as the sole basis for treatment or other patient management decisions. Negative results must be combined with clinical observations, patient history, and epidemiological information. The expected result is Negative. Fact Sheet for Patients: SugarRoll.be Fact Sheet for Healthcare Providers: https://www.woods-mathews.com/ This test is not yet approved or cleared by the [place] FDA and  has been authorized for detection and/or diagnosis of SARS-CoV-2 by FDA under an Emergency Use Authorization (EUA). This EUA will remain  in effect (meaning this test can be used) for the duration of the COVID-19 declaration under Section 56 4(b)(1) of the Act, 21 U.S.C. section 360bbb-3(b)(1), unless the authorization is terminated or revoked sooner. Performed at [facility], [place] [address], [place], [place] [address]   Aerobic Culture (superficial specimen)     Status: None (Preliminary result)   Collection Time: 02/03/19 11:46 AM   Specimen: Sacral; Wound  Result Value Ref Range Status   Specimen Description   Final    SACRAL Performed at [place] [address], [place], [place] [address]    Special Requests   Final    NONE Performed at [facility], [place] [address] [place], [place] [address]    Gram Stain   Final    RARE WBC PRESENT,BOTH PMN AND MONONUCLEAR ABUNDANT GRAM POSITIVE COCCI IN PAIRS ABUNDANT GRAM VARIABLE ROD     Culture   Final    ABUNDANT VIRIDANS STREPTOCOCCUS FEW STAPHYLOCOCCUS AUREUS CULTURE REINCUBATED FOR BETTER GROWTH Performed at [facility], [place] [address], [place], [place] [address]    Report Status PENDING  Incomplete     RN Pressure Injury Documentation: Pressure Injury 02/02/19 Buttocks Left Unstageable - Full thickness tissue loss in which the base of the ulcer is covered by slough (yellow, tan, gray, green or brown) and/or eschar (tan, brown or black) in the wound bed. (Active)  02/02/19 2213  Location: Buttocks  Location Orientation: Left  Staging: Unstageable - Full thickness tissue loss in which the base of the ulcer is covered by slough (yellow, tan, gray, green or brown) and/or eschar (tan, brown or black) in the wound bed.  Wound Description (Comments):   Present on Admission: Yes   Radiology Studies: US Renal  Result Date: 02/03/2019 CLINICAL DATA:  Urinary retention EXAM: RENAL / URINARY TRACT ULTRASOUND COMPLETE COMPARISON:  CT dated 01/06/2019 FINDINGS: Right Kidney: Renal measurements: 8.8 x 4.2 x 3.4 cm = volume: 64 mL. There is mild hydronephrosis. Left Kidney: Renal measurements: 8.8 x 3.9 x 2.7 cm = volume: 48.7 mL. There is mild hydronephrosis. Bladder: Both ureteral jets were noted. There is free fluid in the patient's pelvis. IMPRESSION: 1. Mild bilateral hydronephrosis. 2. Both ureteral jets were visualized. 3. Free fluid in the pelvis. Electronically Signed   By: Constance Holster M.D.   On: 02/03/2019 18:15   Dg Chest Port 1 View  Result Date: 02/04/2019 CLINICAL DATA:  Shortness of breath this morning. EXAM: PORTABLE CHEST 1 VIEW COMPARISON:  02/03/2019 FINDINGS: Stable enlarged cardiac silhouette and dense left lower lobe airspace opacity. No significant change in overall  amount of patchy opacity in the right upper lobe and both perihilar regions. Stable small right pleural effusion and probable small left pleural effusion. Diffuse osteopenia.  IMPRESSION: 1. No significant change in dense left lower lobe atelectasis or pneumonia. 2. No significant change in probable pneumonia in the right upper lobe and both perihilar regions. Alveolar pulmonary edema is less likely in the absence of pulmonary vascular congestion and interstitial pulmonary edema. 3. Stable small right pleural effusion and probable small left pleural effusion. 4. Stable cardiomegaly. Electronically Signed   By: Claudie Revering M.D.   On: 02/04/2019 11:28   Dg Chest Port 1 View  Result Date: 02/03/2019 CLINICAL DATA:  Wheezing. EXAM: PORTABLE CHEST 1 VIEW COMPARISON:  February 02, 2019 FINDINGS: Enlarged cardiac silhouette. Calcific atherosclerotic disease of the aorta. Bilateral moderate in size pleural effusions. Worsening aeration of the lungs with focal airspace opacity in the right upper lobe. Mild pulmonary edema. Osseous structures are without acute abnormality. Soft tissues are grossly normal. IMPRESSION: 1. Bilateral moderate in size pleural effusions. 2. Worsening aeration of the lungs with focal airspace opacity in the right upper lobe. 3. Mild pulmonary edema. 4. Calcific atherosclerotic disease of the aorta. Electronically Signed   By: Fidela Salisbury M.D.   On: 02/03/2019 17:38   Scheduled Meds: . aspirin EC  81 mg Oral Daily  . clopidogrel  75 mg Oral Daily  . feeding supplement (ENSURE ENLIVE)  237 mL Oral BID BM  . folic acid  1 mg Oral Daily  . Gerhardt's butt cream  1 application Topical TID  . guaiFENesin  1,200 mg Oral BID  . ipratropium  0.5 mg Nebulization TID  . latanoprost  1 drop Both Eyes QHS  . levalbuterol  0.63 mg Nebulization TID  . megestrol  400 mg Oral TID  . mirtazapine  45 mg Oral QHS  . multivitamin with minerals  1 tablet Oral Daily  . nutrition supplement (JUVEN)  1 packet Oral BID BM  . nystatin  5 mL Oral QID  . nystatin cream   Topical BID  . pantoprazole  20 mg Oral Daily   Continuous Infusions: .  ceFAZolin (ANCEF) IV 1 g  (02/04/19 1346)  . metronidazole 500 mg (02/04/19 1432)    LOS: 2 days   Kerney Elbe, DO [facility] PAGER is on [place]  If 7PM-7AM, please contact night-coverage [email or web address] Password [email or web address] 02/04/2019, 8:50 PM

## 2019-02-05 DIAGNOSIS — L089 Local infection of the skin and subcutaneous tissue, unspecified: Secondary | ICD-10-CM

## 2019-02-05 DIAGNOSIS — L8990 Pressure ulcer of unspecified site, unspecified stage: Secondary | ICD-10-CM

## 2019-02-05 DIAGNOSIS — D72829 Elevated white blood cell count, unspecified: Secondary | ICD-10-CM

## 2019-02-05 DIAGNOSIS — B954 Other streptococcus as the cause of diseases classified elsewhere: Secondary | ICD-10-CM

## 2019-02-05 DIAGNOSIS — B9561 Methicillin susceptible Staphylococcus aureus infection as the cause of diseases classified elsewhere: Secondary | ICD-10-CM

## 2019-02-05 LAB — CBC WITH DIFFERENTIAL/PLATELET
Abs Immature Granulocytes: 0.4 10*3/uL — ABNORMAL HIGH (ref 0.00–0.07)
Basophils Absolute: 0 10*3/uL (ref 0.0–0.1)
Basophils Relative: 0 %
Eosinophils Absolute: 0 10*3/uL (ref 0.0–0.5)
Eosinophils Relative: 0 %
HCT: 27.2 % — ABNORMAL LOW (ref 36.0–46.0)
Hemoglobin: 8.9 g/dL — ABNORMAL LOW (ref 12.0–15.0)
Immature Granulocytes: 2 %
Lymphocytes Relative: 5 %
Lymphs Abs: 1 10*3/uL (ref 0.7–4.0)
MCH: 31.3 pg (ref 26.0–34.0)
MCHC: 32.7 g/dL (ref 30.0–36.0)
MCV: 95.8 fL (ref 80.0–100.0)
Monocytes Absolute: 1.1 10*3/uL — ABNORMAL HIGH (ref 0.1–1.0)
Monocytes Relative: 5 %
Neutro Abs: 19.9 10*3/uL — ABNORMAL HIGH (ref 1.7–7.7)
Neutrophils Relative %: 88 %
Platelets: 212 10*3/uL (ref 150–400)
RBC: 2.84 MIL/uL — ABNORMAL LOW (ref 3.87–5.11)
RDW: 17.4 % — ABNORMAL HIGH (ref 11.5–15.5)
WBC: 22.5 10*3/uL — ABNORMAL HIGH (ref 4.0–10.5)
nRBC: 0 % (ref 0.0–0.2)

## 2019-02-05 LAB — COMPREHENSIVE METABOLIC PANEL
ALT: 14 U/L (ref 0–44)
AST: 35 U/L (ref 15–41)
Albumin: 2.3 g/dL — ABNORMAL LOW (ref 3.5–5.0)
Alkaline Phosphatase: 122 U/L (ref 38–126)
Anion gap: 13 (ref 5–15)
BUN: 22 mg/dL (ref 8–23)
CO2: 19 mmol/L — ABNORMAL LOW (ref 22–32)
Calcium: 8 mg/dL — ABNORMAL LOW (ref 8.9–10.3)
Chloride: 110 mmol/L (ref 98–111)
Creatinine, Ser: 1.08 mg/dL — ABNORMAL HIGH (ref 0.44–1.00)
GFR calc Af Amer: 54 mL/min — ABNORMAL LOW (ref >60)
GFR calc non Af Amer: 46 mL/min — ABNORMAL LOW (ref >60)
Glucose, Bld: 95 mg/dL (ref 70–99)
Potassium: 3.3 mmol/L — ABNORMAL LOW (ref 3.5–5.1)
Sodium: 142 mmol/L (ref 135–145)
Total Bilirubin: 1.1 mg/dL (ref 0.3–1.2)
Total Protein: 6.3 g/dL — ABNORMAL LOW (ref 6.5–8.1)

## 2019-02-05 LAB — GLUCOSE, CAPILLARY
Glucose-Capillary: 100 mg/dL — ABNORMAL HIGH (ref 70–99)
Glucose-Capillary: 113 mg/dL — ABNORMAL HIGH (ref 70–99)
Glucose-Capillary: 119 mg/dL — ABNORMAL HIGH (ref 70–99)
Glucose-Capillary: 79 mg/dL (ref 70–99)
Glucose-Capillary: 82 mg/dL (ref 70–99)
Glucose-Capillary: 86 mg/dL (ref 70–99)
Glucose-Capillary: 88 mg/dL (ref 70–99)

## 2019-02-05 LAB — PHOSPHORUS: Phosphorus: 2.8 mg/dL (ref 2.5–4.6)

## 2019-02-05 LAB — MAGNESIUM: Magnesium: 1.8 mg/dL (ref 1.7–2.4)

## 2019-02-05 MED ORDER — POTASSIUM CHLORIDE 10 MEQ/100ML IV SOLN
10.0000 meq | INTRAVENOUS | Status: AC
  Administered 2019-02-05 (×2): 10 meq via INTRAVENOUS
  Filled 2019-02-05 (×2): qty 100

## 2019-02-05 MED ORDER — SODIUM BICARBONATE-DEXTROSE 150-5 MEQ/L-% IV SOLN
150.0000 meq | INTRAVENOUS | Status: AC
  Administered 2019-02-05: 150 meq via INTRAVENOUS
  Filled 2019-02-05: qty 1000

## 2019-02-05 MED ORDER — POTASSIUM CHLORIDE 10 MEQ/100ML IV SOLN
10.0000 meq | Freq: Once | INTRAVENOUS | Status: AC
  Administered 2019-02-05: 10 meq via INTRAVENOUS
  Filled 2019-02-05: qty 100

## 2019-02-05 MED ORDER — CHLORHEXIDINE GLUCONATE CLOTH 2 % EX PADS
6.0000 | MEDICATED_PAD | Freq: Every day | CUTANEOUS | Status: DC
  Administered 2019-02-06 – 2019-02-13 (×7): 6 via TOPICAL

## 2019-02-05 MED ORDER — MAGNESIUM SULFATE IN D5W 1-5 GM/100ML-% IV SOLN
1.0000 g | Freq: Once | INTRAVENOUS | Status: AC
  Administered 2019-02-05: 1 g via INTRAVENOUS
  Filled 2019-02-05: qty 100

## 2019-02-05 NOTE — Progress Notes (Signed)
PROGRESS NOTE    Sydney [name]  JJK:[id number] DOB: 1930/07/21 DOA: 02/02/2019 PCP: [name], MD  Brief Narrative:  HPI per Dr. Orene Desanctis on 02/02/2019 Sydney [name] is a 85 y.o. 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  Daughter who is her HCPOA provides most of the history.  Patient was recently hospitalized from 12/19/2018 and discharged 2 days ago on 10/6.  She was admitted for progressive weakness, malaise, fatigue and significant failure to thrive.  She also developed encephalopathy and worsening liver enzymes.  She had an extensive work-up and was seen by numerous subspecialists including ID, GI, oncology and neurology without any significant improvement in her failure to thrive syndrome.  She was discharged home with home health given daughter was resistant to SNF and hospice care.  Since discharge, daughter reports that she has not had any acute changes.  Her appetite continues to be poor.  Daughter was helping her to get supplemental home health nursing and had a nurse come evaluate her.  The nurse was concerned about wheezing on her respiratory exam and her sacral wound prompting her to come into the ED.  Daughter was unsure whether she was hypoxic at home.  Denies any fever, cough or shortness of breath.  Denies any chest pain.  Denies any nausea, vomiting or abdominal pain.  Patient has chronic diarrhea from her history of C. difficile with stool transplant and daughter has noticed increased frequency of her bowel movements. Daughter would like her to get her sacral wound care for and her goal was for her mother to be able to ambulate on her own. She does not want her mother to receive any narcotics or opioids specifically morphine and tramadol since she felt it sedated her mother during prior admission.  Also does not want  Ativan. Daughter states that she strongly wants [place] to consult infectious disease and GI prior to starting any antibiotics given her history.  ED Course: She was afebrile and had elevated blood pressure up to 164/100.  Also tachycardic up to 120s. CBC showed leukocytosis of 13.7 and hemoglobin of 9.5 which is around her baseline.  CMP showed potassium of 3.2, mildly elevated alkaline phosphatase 158 which is around her baseline.  Chest x-ray showed evidence of mild interstitial edema and small bilateral pleural effusion with significant interval improvement in the left effusion.  Central lung infection is possible but less likely.  Mild stable cardiomegaly. CT pelvis showed small soft tissue ulcer posterior to the left of the coccyx with a 1.9 x 1 x 0.4 cm abscess slightly more inferior.  No evidence of osteomyelitis.  Diffuse subcutaneous edema.  **Interim History General surgery was consulted for further evaluation of her sacral wound and abscess.  Patient's daughter would not let me put her on any antibiotics unless I spoke with ID so ID was consulted and as a courtesy GI was consulted as well and Dr. Carlean Purl saw the patient briefly and talk with her daughter as a Manufacturing engineer.  Antibiotics were started with IV cefazolin and cultures were sent as her wound abscess was spontaneously draining.  Patient started having some respiratory distress likely due to intermittent mucous plugging because of her failure to thrive and ability to cough up her secretions as well as mild volume overload so Pulmonary was consulted for further evaluation she is transferred to telemetry for continuous pulse  monitoring.    Flutter valve is recommended however patient daughter refused and repositioning refused.  Patient's daughter is only requesting the patient be given Tylenol.  Ethics was consulted for further evaluation as guidance was sought and [employer] spoke to me at length and recommended discharging the patient  home as soon as possible pending culture results and having a Education officer, museum come out to evaluate no living situation.  Patient is incredibly frail debilitated and has severe failure to thrive and has had significant poor p.o. intake.  She is visibly in pain despite this but daughter is refusing medications for the patient.  An appropriate approach would be to transition the patient to comfort measures however patient daughter declined.  I reconsulted palliative care for further evaluation and recommendations and Dr. Domingo Cocking met with the patient and the daughter and they are to continue full scope of treatment currently.  Patient's daughter did not want to discuss hospice support for mother and did not want any further involvement from a palliative care team either but Dr. Domingo Cocking provided contact information and provided a note regarding the outpatient palliative services to the hospital at [facility].  Patient's daughter continues to have unrealistic goals and has not come to terms with the patient's mortality and feels that the patient can get better and walk and have her wound care healed and get back to her normal activities of daily living despite her continued failure to thrive and high risk for aspiration.    General surgery evaluated and had a lengthy discussion with the patient's daughter that her sacral wound and Dr. gross felt that the wound will never heal given her nutritional status and failure to thrive despite nursing interventions multidisciplinary evaluations.  The daughter continues not to want to hear bad news and refuses to believe that her mother is this sick and wanted to focus on her mother's "restoration".  I brought up the fact that the patient had a history of heart failure and the patient's daughter refused to believe this even though this is documented back in her echocardiogram in 2016.  Patient's daughter refused to believe that she had a diagnosis of heart failure and felt that there  is nothing wrong with the patient's heart even though I described that she could get volume overloaded given her poor ventricular relaxation due to her diastolic heart failure.  She continues to believe that the patient came to bounce back from her significant weakness and get to a prior level where she was independent of her ADLs.  ID recommending was ceftezole and and changing to oral therapy when sensitivities are identified and he recommends 5 more days of treatment.  He also recommends Flagyl for another day.  Leukocytosis slightly trending down because patient continues to have acute urinary control possible catheter and will resume IV fluids today with gentle IV fluids at 50 mL's per hour given her worsening renal function  Assessment & Plan:   Principal Problem:   Failure to thrive in adult Active Problems:   Protein calorie malnutrition (HCC)   Ataxia   MDD (major depressive disorder)   Sacral ulcer (HCC)   SOB (shortness of breath)   Wheezing   TIA (transient ischemic attack)   PVD (peripheral vascular disease) ([place])  Failure to thrive/deconditioning/dehydration with Hypoglycemia from poor po intake, worsening -Patient had extensive work-up without any findings with numerous specialist at last admission. Reportedly felt that patient has met maximal benefit during last hospital stay and was discharged and Ethics  involved.  -Daughter declined NG or PEG tube (not a candidate after discussion with GI) and refused hospice at discharge. Ethics committee was noted to be involved. See last d/c note for full details.  -Pt was tachycardiac but likely secondary to dehydration. No fever but Leukocytosis is worsening and could be in the setting of IV steroid demargination possible aspiration -Consult dietician - SLP for swallow study- pt high risk for aspiration will allow for dysphagia 3 diet and have the daughter only feed the patient; speech therapy evaluated and started the patient was very  fatigued and anxious and with her mouth and lips being severely dry intake was expected to be poor and insufficient fo any type of progress.  Speech therapy will continue efforts to attempt swallow evaluation in next 1 to 2 days and will continue as I have the patient's daughter feed the patient with a dysphagia 3 diet with basic precautions and risk of aspirations were discussed given that the patient has been very poor cough reflex and cannot really mobilize her secretions -Wound care for her sacral wound -PT/OT recommending Home Health as daughter refusing SNF -Continue Megace -Started IVF hydration with IV Sodium Bicarbonate at 50 mL/hr again; given a dose of IV Lasix yesterday and likely over diuresed  Acute Respiratory Failure with Hypoxia and Cough in setting of known diastolic dysfunction along with poor mobilization of secretions from failure to thrive and generalized weakness -CXR was read as"Enlarged cardiac silhouette. Calcific atherosclerotic disease of the aorta. Bilateral moderate in size pleural effusions. Worsening aeration of the lungs with focal airspace opacity in the right upper lobe. Mild pulmonary edema. Osseous structures are without acute abnormality. Soft tissues are grossly normal." by the Radiologist -Given IV Lasix 20 mg x 2 yesterday and will give IV Lasix 40 g x 1 today -Pulmonary feels Pleural effusions are small and that she has vascular congestion that is probably contributing to her intermittent Hypoxemia but Dr. Lamonte Sakai believes that the cause of this is likely related to some degree of diastolic dysfunction but more so malnutrition and poor oncotic pressures as well as possibly noncardiac inflammatory edema due to ongoing infection or intermittent aspiration.  He suspects that the biggest contributor to her recent acute change in oxygen need relation to her impaired airway protection, mucus and secretion management given her malnourishment nonhealing wound status and  progressive weakness; I have personally reviewed her chest x-ray and I am in agreement with Dr. Lamonte Sakai -Given Acidosis c/w Gentle IVF at 10 mL's per hour this is now stopped and patient was given a dose of IV Lasix -Flutter Valve, Incentive Spirometry, and Guaifenesin -Temporizing measures with suctioning and pulmonary hygiene -Continue with speech therapy and aspiration precautions and will allow for dysphagia 3 diet with the daughter knowing that she can likely aspirate -Pulmonary feels no definitive role for CT imaging and do not feel that that to make any remarkable change and improvement on his nutritional status is improved, strength building and physical therapy along with adequate management of her underlying infection and Dr. Corky Sox expects progressive weakening, worsening airway protection and aspiration -Pulmonary has recommended a comfort based approach and the daughter has adamantly refused -We will hold diuresis today and continue IV saline bicarbonate at 50 mL's per hour for 20 hours -Repeat echocardiogram today showed an EF of 50 to 55% with concentric left ventricle hypertrophy and spectral Doppler parameters show left ventricular diastolic upper parameters are indeterminate pattern of left ventricular diastolic filling -If necessary we will  discharge the patient with oxygen at home; she was on 4 L of oxygen via nasal cannula yesterday and today was on 2 L -Case was discussed with [place] who feels that the most appropriate course of action would be to find out what culture she is going to treat her with p.o. antibiotics and discharge her home with oxygen if necessary knowing that she will likely come back to the hospital given the daughter's opposition to hospice and comfort based measures  -Patient should really transition to comfort care measures at this time however patient daughter is adamant and opposed and will stabilize the patient and after he can be discharged home once  cultures are resulted -Repeat chest x-ray today showed "No significant change in dense left lower lobe atelectasis or pneumonia. No significant change in probable pneumonia in the right upper lobe and both perihilar regions. Alveolar pulmonary edema is less likely in the absence of pulmonary vascular congestion and interstitial pulmonary edema. Stable small right pleural effusion and probable small left pleural effusion. Stable cardiomegaly." -Continue with supplemental oxygen via nasal cannula and wean O2 as tolerated -Started the patient on IV Flagyl for aspiration based on ID recommendations and will continue therapy for 3 days and will continue for now -Continue to monitor respiratory status carefully -Strict I's and O's and daily weights and patient is -1.92 L since admission  Metabolic Acidosis -In the setting of dehydration and poor p.o. intake Patient CO2 of 13, anion gap was 14 and chloride levels 113 -Now CO2 is 19, anion gap is 13, and chloride is 110. -IV fluids as above slightly resumed again -Continue monitor and trend repeat CMP in a.m.  Acute Urinary Retention -I and O Cath today however she may require a Foley catheter as she is required several and will cath -Give Lasix x2 the day before yesterday and will repeat yesterday at 40 mg -Renal U/S done and showed mild hydronephrosis and I spoke with urology Dr. Lovena Neighbours who recommends continuing current plan of care and no further intervention with stenting necessary -Continue to Monitor Strict I's and O's -Place Foley catheter given continue urinary retention  Sacral Decubitus Ulcer Stage4 with a Tract and Now Abscess -Continue local wound care-appreciate wound care RN input and Surgical Evaluation -She continues to have discomfort at the sacral site and Daughter refuses any medication but Tylenol -Cx's sent and they have been reintubated for further growth -Started Empiric Abx with Cefazolin per ID Recc's -Initial Gram Stain  showed "RARE WBC PRESENT,BOTH PMN AND MONONUCLEAR  ABUNDANT GRAM POSITIVE COCCI IN PAIRS  ABUNDANT Ord" with Cx's showing staph aureus mainly Strep Viridans with sensitivities pending -Hydrotherapy recommended by General Surgery and this is to be done today -Dr. gross had a extremely long discussion with the patient's daughter about the wound and her prognosis and daughter did not want to hear any bad news and only wanted to focus on the positive" speak life" over the patient  Deconditioning/Debility -Secondary to prolonged hospitalization/acute illness-and underlying failure to thrive syndrome.   -PT evaluated the patient recommending Home Health as patient's daughter will not take her to SNF -Prognosis is extremely poor patient is going to become weaker and more debilitated with her poor p.o. intake and she has a very high risk of inpatient decompensation and worsening  Severe Malnutrition in the Context of Chronic Illness -Nutritionist consulted for further evaluation and recommendations -Nutritionist recommended ordering Ensure Enlive BID, Magic Cup BID, and Juven BID and recommending  Floor staff to assist with feeding as needed if daughter is not present   Hypokalemia  -K+ was 3.4 -Replete with IV KCl again today -Checked Mag Level and was 1.9 -Continue to Monitor and Replete as Necessary -Repeat CMP in AM   Oral thrush -Nystatin suspension continue  Normocytic Anemia -Hemoglobin of 9.5 on admission and now Hgb/Hct is 11.7/35.8 the day before yesterday repeat this morning was 8.9/27 point stable with no signs of bleeding. -Continue to monitor CBC  HTN -stable without antihypertensive  History of C. difficile colitis s/p stool transplantation -Continues to have loose stool and daughter endorse increased frequency in the past few days.  However, she does not have a fever or abdominal pain to suggest recurrent C. difficile colitis -Infectious disease  recommendation at last admission was to continue Imodium as needed -GI has no hesitation using Abx per Dr. Celesta Aver note and she is now on ceftriaxone and IV Flagyl  History of TIA -Continue aspirin and Plavix   Leukocytosis -Worsened and likely in the setting of steroid demargination but also could be from aspiration and possible aspiration pneumonia as well and her sacral wound  -patient is on IV cefazolin and we will add IV Flagyl based on ID recommendation -Continue monitor and trend WBC -Repeat CBC in a.m. and continue to monitor for signs and symptoms of infection  Renal Insufficiency/AKI -Mild and likely worsened in setting of IV Lasix -We will hold further Lasix doses and resume IV fluid hydration with gentle IV sodium bicarbonate 50 mL's per hour -Encourage p.o. intake Can avoid nephrotoxic medications, contrast dyes, hypotension if possible -Continue monitor and trend renal function -Repeat CMP in a.m.  DVT prophylaxis: SCDs Code Status: FULL CODE Family Communication: Discussed with Daughter in 13  Disposition Plan: Anticipate D/C Home in the next 24-48 hours pending Cx results and improvement   Consultants:   General Surgery Dr. Johney Maine  Infectious Diseases  Pulmonary  Discussed with Gastroenterology Dr. Carlean Purl  Discussed with Palliative Care Dr. Hilma Favors; formal consultation with Dr. Harrison Mons -Spoke with Idolina Primer  Procedures: Hydrotherapy   Antimicrobials:  Anti-infectives (From admission, onward)   Start     Dose/Rate Route Frequency Ordered Stop   02/04/19 1400  metroNIDAZOLE (FLAGYL) IVPB 500 mg     500 mg 100 mL/hr over 60 Minutes Intravenous Every 8 hours 02/04/19 1343     02/03/19 1500  ceFAZolin (ANCEF) IVPB 1 g/50 mL premix     1 g 100 mL/hr over 30 Minutes Intravenous Every 8 hours 02/03/19 1444       Subjective: Patient seen and examined at bedside.  Continues to hold onto her urine so Foley cath was placed.  She would not  complain of any pain today.  States that she was "thirsty".  No nausea or vomiting.  Daughter was not at bedside but I called her in the hallway and updated her.  We will start gentle IV fluid hydration today per day.  No other concerns or plans at this time and nursing reports no acute changes.  Objective: Vitals:   02/05/19 0426 02/05/19 0758 02/05/19 1200 02/05/19 1557  BP: 139/77  140/70   Pulse: 92  96   Resp: 20  (!) 22   Temp: 98.4 F (36.9 C)  99.3 F (37.4 C)   TempSrc: Axillary  Rectal   SpO2: 90% 94% 96% 95%  Weight:      Height:        Intake/Output Summary (Last 24 hours)  at 02/05/2019 1953 Last data filed at 02/05/2019 1330 Gross per 24 hour  Intake 0 ml  Output 775 ml  Net -775 ml   Filed Weights   02/02/19 1345  Weight: 52.2 kg   Examination: Physical Exam:  Constitutional: Extremely thin and cachectic frail elderly African-American female who is in no acute distress but does appear a little uncomfortable,  Eyes: Lids and conjunctivae normal, sclerae anicteric  ENMT: External Ears, Nose appear normal. Grossly normal hearing. Mucous membranes are moist. Neck: Appears normal, supple, no cervical masses, normal ROM, no appreciable thyromegaly; no JVD Respiratory: Diminished to auscultation bilaterally, no wheezing, rales, rhonchi or crackles. Normal respiratory effort and patient is not tachypenic. No accessory muscle use.  He is wearing 2 L of supplemental oxygen via nasal cannula and had unlabored breathing Cardiovascular: RRR, no murmurs / rubs / gallops. S1 and S2 auscultated. No extremity edema.   Abdomen: Soft, non-tender, non-distended. Bowel sounds positive x4.  GU: Deferred. Musculoskeletal: No clubbing / cyanosis of digits/nails. No joint deformity upper and lower extremities.  Skin: The sacral decubitus ulcer with a communicating tract is now covered  Neurologic: CN 2-12 grossly intact with no focal deficits. Romberg sign cerebellar reflexes not  assessed.  Psychiatric: Slightly impaired judgment and insight. Alert and oriented x 2 Normal mood and appropriate affect.   Data Reviewed: I have personally reviewed following labs and imaging studies  CBC: Recent Labs  Lab 02/02/19 1538 02/03/19 0257 02/04/19 0540 02/05/19 0525  WBC 13.7* 17.8* 23.8* 22.5*  NEUTROABS 12.2*  --  21.9* 19.9*  HGB 9.5* 11.7* 9.7* 8.9*  HCT 30.3* 35.8* 29.7* 27.2*  MCV 99.3 96.2 95.5 95.8  PLT 245 193 233 017   Basic Metabolic Panel: Recent Labs  Lab 01/31/19 0355 02/02/19 1538 02/03/19 0257 02/04/19 0540 02/05/19 0525  NA 133* 137 140 142 142  K 3.6 3.2* 3.4* 3.4* 3.3*  CL 108 114* 113* 110 110  CO2 15* 13* 13* 19* 19*  GLUCOSE 75 85 67* 146* 95  BUN '14 18 18 20 22  '$ CREATININE 0.81 0.82 0.83 0.95 1.08*  CALCIUM 7.9* 7.8* 8.1* 8.1* 8.0*  MG  --   --  1.9 1.9 1.8  PHOS  --   --  3.4 3.0 2.8   GFR: Estimated Creatinine Clearance: 30.8 mL/min (A) (by C-G formula based on SCr of 1.08 mg/dL (H)). Liver Function Tests: Recent Labs  Lab 01/31/19 0355 02/02/19 1538 02/03/19 0257 02/04/19 0540 02/05/19 0525  AST 39 41 39 35 35  ALT '27 26 25 24 14  '$ ALKPHOS 161* 158* 144* 132* 122  BILITOT 1.0 1.0 0.9 1.0 1.1  PROT 5.9* 6.5 6.3* 6.5 6.3*  ALBUMIN 2.0* 2.3* 2.3* 2.4* 2.3*   No results for input(s): LIPASE, AMYLASE in the last 168 hours. No results for input(s): AMMONIA in the last 168 hours. Coagulation Profile: No results for input(s): INR, PROTIME in the last 168 hours. Cardiac Enzymes: No results for input(s): CKTOTAL, CKMB, CKMBINDEX, TROPONINI in the last 168 hours. BNP (last 3 results) No results for input(s): PROBNP in the last 8760 hours. HbA1C: No results for input(s): HGBA1C in the last 72 hours. CBG: Recent Labs  Lab 02/05/19 0017 02/05/19 0431 02/05/19 0751 02/05/19 1206 02/05/19 1653  GLUCAP 88 79 86 82 100*   Lipid Profile: No results for input(s): CHOL, HDL, LDLCALC, TRIG, CHOLHDL, LDLDIRECT in the last 72  hours. Thyroid Function Tests: No results for input(s): TSH, T4TOTAL, FREET4, T3FREE, THYROIDAB in the  last 72 hours. Anemia Panel: No results for input(s): VITAMINB12, FOLATE, FERRITIN, TIBC, IRON, RETICCTPCT in the last 72 hours. Sepsis Labs: Recent Labs  Lab 02/02/19 2005  LATICACIDVEN 1.5    Recent Results (from the past 240 hour(s))  SARS CORONAVIRUS 2 (TAT 6-24 HRS) Nasopharyngeal Nasopharyngeal Swab     Status: None   Collection Time: 02/02/19  6:43 PM   Specimen: Nasopharyngeal Swab  Result Value Ref Range Status   SARS Coronavirus 2 NEGATIVE NEGATIVE Final    Comment: (NOTE) SARS-CoV-2 target nucleic acids are NOT DETECTED. The SARS-CoV-2 RNA is generally detectable in upper and lower respiratory specimens during the acute phase of infection. Negative results do not preclude SARS-CoV-2 infection, do not rule out co-infections with other pathogens, and should not be used as the sole basis for treatment or other patient management decisions. Negative results must be combined with clinical observations, patient history, and epidemiological information. The expected result is Negative. Fact Sheet for Patients: SugarRoll.be Fact Sheet for Healthcare Providers: https://www.woods-mathews.com/ This test is not yet approved or cleared by the [place] FDA and  has been authorized for detection and/or diagnosis of SARS-CoV-2 by FDA under an Emergency Use Authorization (EUA). This EUA will remain  in effect (meaning this test can be used) for the duration of the COVID-19 declaration under Section 56 4(b)(1) of the Act, 21 U.S.C. section 360bbb-3(b)(1), unless the authorization is terminated or revoked sooner. Performed at [facility], [place] [address], [place], [place] [address]   Aerobic Culture (superficial specimen)     Status: None (Preliminary result)   Collection Time: 02/03/19 11:46 AM   Specimen: Sacral; Wound    Result Value Ref Range Status   Specimen Description   Final    SACRAL Performed at [facility] [address] [place], [place] [address]    Special Requests   Final    NONE Performed at [facility], [place] [address] [place], [place] [address]    Gram Stain   Final    RARE WBC PRESENT,BOTH PMN AND MONONUCLEAR ABUNDANT GRAM POSITIVE COCCI IN PAIRS ABUNDANT GRAM VARIABLE ROD    Culture   Final    ABUNDANT VIRIDANS STREPTOCOCCUS FEW STAPHYLOCOCCUS AUREUS SUSCEPTIBILITIES TO FOLLOW Performed at [facility], [place] [address], [place], [place] [address]    Report Status PENDING  Incomplete     RN Pressure Injury Documentation: Pressure Injury 02/02/19 Buttocks Left Unstageable - Full thickness tissue loss in which the base of the ulcer is covered by slough (yellow, tan, gray, green or brown) and/or eschar (tan, brown or black) in the wound bed. (Active)  02/02/19 2213  Location: Buttocks  Location Orientation: Left  Staging: Unstageable - Full thickness tissue loss in which the base of the ulcer is covered by slough (yellow, tan, gray, green or brown) and/or eschar (tan, brown or black) in the wound bed.  Wound Description (Comments):   Present on Admission: Yes   Radiology Studies: Dg Chest Port 1 View  Result Date: 02/04/2019 CLINICAL DATA:  Shortness of breath this morning. EXAM: PORTABLE CHEST 1 VIEW COMPARISON:  02/03/2019 FINDINGS: Stable enlarged cardiac silhouette and dense left lower lobe airspace opacity. No significant change in overall amount of patchy opacity in the right upper lobe and both perihilar regions. Stable small right pleural effusion and probable small left pleural effusion. Diffuse osteopenia. IMPRESSION: 1. No significant change in dense left lower lobe atelectasis or pneumonia. 2. No significant change in  probable pneumonia in the right upper lobe and both perihilar regions. Alveolar pulmonary edema is less  likely in the absence of pulmonary vascular congestion and interstitial pulmonary edema. 3. Stable small right pleural effusion and probable small left pleural effusion. 4. Stable cardiomegaly. Electronically Signed   By: Claudie Revering M.D.   On: 02/04/2019 11:28   Scheduled Meds:  aspirin EC  81 mg Oral Daily   clopidogrel  75 mg Oral Daily   feeding supplement (ENSURE ENLIVE)  237 mL Oral BID BM   folic acid  1 mg Oral Daily   Gerhardt's butt cream  1 application Topical TID   guaiFENesin  1,200 mg Oral BID   ipratropium  0.5 mg Nebulization TID   latanoprost  1 drop Both Eyes QHS   levalbuterol  0.63 mg Nebulization TID   megestrol  400 mg Oral TID   mirtazapine  45 mg Oral QHS   multivitamin with minerals  1 tablet Oral Daily   nutrition supplement (JUVEN)  1 packet Oral BID BM   nystatin  5 mL Oral QID   nystatin cream   Topical BID   pantoprazole  20 mg Oral Daily   Continuous Infusions:   ceFAZolin (ANCEF) IV 1 g (02/05/19 0504)   metronidazole 500 mg (02/05/19 1719)   sodium bicarbonate 150 mEq in dextrose 5% 1000 mL 150 mEq (02/05/19 0958)    LOS: 3 days   Kerney Elbe, DO [facility] PAGER is on AMION  If 7PM-7AM, please contact night-coverage [email or web address] Password [facility] 02/05/2019, 7:53 PM

## 2019-02-05 NOTE — Progress Notes (Signed)
[place] for Infectious Disease   Reason for visit: Follow up on wound infection  Interval History: culture with Staph aureus and mainly Strep viridans.  On cefazolin.  Also Flagyl for ? Aspiration.  No new complaints.   Physical Exam: Constitutional:  Vitals:   02/05/19 0426 02/05/19 0758  BP: 139/77   Pulse: 92   Resp: 20   Temp: 98.4 F (36.9 C)   SpO2: 90% 94%   patient appears in NAD Eyes: anicteric Respiratory: Normal respiratory effort; CTA B Cardiovascular: RRR GI: soft, nt, nd  Review of Systems: Constitutional: negative for fevers and chills Gastrointestinal: negative for nausea and diarrhea Integument/breast: negative for rash  Lab Results  Component Value Date   WBC 22.5 (H) 02/05/2019   HGB 8.9 (L) 02/05/2019   HCT 27.2 (L) 02/05/2019   MCV 95.8 02/05/2019   PLT 212 02/05/2019    Lab Results  Component Value Date   CREATININE 1.08 (H) 02/05/2019   BUN 22 02/05/2019   NA 142 02/05/2019   K 3.3 (L) 02/05/2019   CL 110 02/05/2019   CO2 19 (L) 02/05/2019    Lab Results  Component Value Date   ALT 14 02/05/2019   AST 35 02/05/2019   ALKPHOS 122 02/05/2019     Microbiology: Recent Results (from the past 240 hour(s))  SARS CORONAVIRUS 2 (TAT 6-24 HRS) Nasopharyngeal Nasopharyngeal Swab     Status: None   Collection Time: 02/02/19  6:43 PM   Specimen: Nasopharyngeal Swab  Result Value Ref Range Status   SARS Coronavirus 2 NEGATIVE NEGATIVE Final    Comment: (NOTE) SARS-CoV-2 target nucleic acids are NOT DETECTED. The SARS-CoV-2 RNA is generally detectable in upper and lower respiratory specimens during the acute phase of infection. Negative results do not preclude SARS-CoV-2 infection, do not rule out co-infections with other pathogens, and should not be used as the sole basis for treatment or other patient management decisions. Negative results must be combined with clinical observations, patient history, and epidemiological  information. The expected result is Negative. Fact Sheet for Patients: SugarRoll.be Fact Sheet for Healthcare Providers: https://www.woods-mathews.com/ This test is not yet approved or cleared by the [place] FDA and  has been authorized for detection and/or diagnosis of SARS-CoV-2 by FDA under an Emergency Use Authorization (EUA). This EUA will remain  in effect (meaning this test can be used) for the duration of the COVID-19 declaration under Section 56 4(b)(1) of the Act, 21 U.S.C. section 360bbb-3(b)(1), unless the authorization is terminated or revoked sooner. Performed at [facility], [place] [address] [place], [place] [address]   Aerobic Culture (superficial specimen)     Status: None (Preliminary result)   Collection Time: 02/03/19 11:46 AM   Specimen: Sacral; Wound  Result Value Ref Range Status   Specimen Description   Final    SACRAL Performed at [place] [address], [place], [place] [address]    Special Requests   Final    NONE Performed at [facility], [place] [address], [place], [place] [address]    Gram Stain   Final    RARE WBC PRESENT,BOTH PMN AND MONONUCLEAR ABUNDANT GRAM POSITIVE COCCI IN PAIRS ABUNDANT GRAM VARIABLE ROD    Culture   Final    ABUNDANT VIRIDANS STREPTOCOCCUS FEW STAPHYLOCOCCUS AUREUS SUSCEPTIBILITIES TO FOLLOW Performed at [facility], [place] [address], [place],  [address]    Report Status PENDING  Incomplete    Impression/Plan:  1. Wound infection - from decubitus ulcer.  Growth of bacteria.  Getting hydrotherapy which will be the main treatment.   Continue with cefazolin. Will change to oral therapy once sensitivities identified. Will need about 5 days of treatment more.   2.  Wound - discussed with the daughter at length that the wound will likely not heal and won't heal unless she becomes ambulatory again, has extensive  improvement in her nutrition for months, wound care and no other setbacks/infections.  The daughter expressed her desire to focus on the positive that there is a chance of recovery of her skin integument along with her functioning.    3.  Possible aspiration - requiring some nasal cannula.  I will continue with flagyl for another day.   4. Leukocytosis - from wound and hydrotherapy.  Stable and will continue to monitor.

## 2019-02-06 ENCOUNTER — Inpatient Hospital Stay (HOSPITAL_COMMUNITY): Payer: Medicare Other

## 2019-02-06 DIAGNOSIS — T8149XA Infection following a procedure, other surgical site, initial encounter: Secondary | ICD-10-CM

## 2019-02-06 LAB — AEROBIC CULTURE W GRAM STAIN (SUPERFICIAL SPECIMEN)

## 2019-02-06 LAB — COMPREHENSIVE METABOLIC PANEL
ALT: 8 U/L (ref 0–44)
AST: 30 U/L (ref 15–41)
Albumin: 2 g/dL — ABNORMAL LOW (ref 3.5–5.0)
Alkaline Phosphatase: 112 U/L (ref 38–126)
Anion gap: 10 (ref 5–15)
BUN: 18 mg/dL (ref 8–23)
CO2: 23 mmol/L (ref 22–32)
Calcium: 7.9 mg/dL — ABNORMAL LOW (ref 8.9–10.3)
Chloride: 109 mmol/L (ref 98–111)
Creatinine, Ser: 0.8 mg/dL (ref 0.44–1.00)
GFR calc Af Amer: >60 mL/min (ref >60)
GFR calc non Af Amer: >60 mL/min (ref >60)
Glucose, Bld: 100 mg/dL — ABNORMAL HIGH (ref 70–99)
Potassium: 3.1 mmol/L — ABNORMAL LOW (ref 3.5–5.1)
Sodium: 142 mmol/L (ref 135–145)
Total Bilirubin: 1 mg/dL (ref 0.3–1.2)
Total Protein: 5.6 g/dL — ABNORMAL LOW (ref 6.5–8.1)

## 2019-02-06 LAB — CBC WITH DIFFERENTIAL/PLATELET
Abs Immature Granulocytes: 0.2 10*3/uL — ABNORMAL HIGH (ref 0.00–0.07)
Basophils Absolute: 0 10*3/uL (ref 0.0–0.1)
Basophils Relative: 0 %
Eosinophils Absolute: 0 10*3/uL (ref 0.0–0.5)
Eosinophils Relative: 0 %
HCT: 27.5 % — ABNORMAL LOW (ref 36.0–46.0)
Hemoglobin: 8.9 g/dL — ABNORMAL LOW (ref 12.0–15.0)
Immature Granulocytes: 1 %
Lymphocytes Relative: 3 %
Lymphs Abs: 0.4 10*3/uL — ABNORMAL LOW (ref 0.7–4.0)
MCH: 31.9 pg (ref 26.0–34.0)
MCHC: 32.4 g/dL (ref 30.0–36.0)
MCV: 98.6 fL (ref 80.0–100.0)
Monocytes Absolute: 0.7 10*3/uL (ref 0.1–1.0)
Monocytes Relative: 5 %
Neutro Abs: 13.8 10*3/uL — ABNORMAL HIGH (ref 1.7–7.7)
Neutrophils Relative %: 91 %
Platelets: 204 10*3/uL (ref 150–400)
RBC: 2.79 MIL/uL — ABNORMAL LOW (ref 3.87–5.11)
RDW: 17.5 % — ABNORMAL HIGH (ref 11.5–15.5)
WBC: 15.1 10*3/uL — ABNORMAL HIGH (ref 4.0–10.5)
nRBC: 0.1 % (ref 0.0–0.2)

## 2019-02-06 LAB — GLUCOSE, CAPILLARY
Glucose-Capillary: 86 mg/dL (ref 70–99)
Glucose-Capillary: 98 mg/dL (ref 70–99)
Glucose-Capillary: 79 mg/dL (ref 70–99)
Glucose-Capillary: 72 mg/dL (ref 70–99)
Glucose-Capillary: 63 mg/dL — ABNORMAL LOW (ref 70–99)
Glucose-Capillary: 116 mg/dL — ABNORMAL HIGH (ref 70–99)

## 2019-02-06 LAB — PHOSPHORUS: Phosphorus: 2.4 mg/dL — ABNORMAL LOW (ref 2.5–4.6)

## 2019-02-06 LAB — MAGNESIUM: Magnesium: 2 mg/dL (ref 1.7–2.4)

## 2019-02-06 MED ORDER — KETOROLAC TROMETHAMINE 15 MG/ML IJ SOLN
15.0000 mg | Freq: Once | INTRAMUSCULAR | Status: AC
  Administered 2019-02-06: 12:00:00 15 mg via INTRAVENOUS
  Filled 2019-02-06: qty 1

## 2019-02-06 MED ORDER — POTASSIUM CHLORIDE 10 MEQ/100ML IV SOLN
10.0000 meq | INTRAVENOUS | Status: AC
  Administered 2019-02-06 (×4): 10 meq via INTRAVENOUS
  Filled 2019-02-06 (×4): qty 100

## 2019-02-06 MED ORDER — DEXTROSE 50 % IV SOLN
12.5000 g | INTRAVENOUS | Status: AC
  Administered 2019-02-06: 12.5 g via INTRAVENOUS

## 2019-02-06 MED ORDER — K PHOS MONO-SOD PHOS DI & MONO 155-852-130 MG PO TABS
500.0000 mg | ORAL_TABLET | Freq: Once | ORAL | Status: DC
  Filled 2019-02-06: qty 2

## 2019-02-06 MED ORDER — CEPHALEXIN 500 MG PO CAPS
500.0000 mg | ORAL_CAPSULE | Freq: Four times a day (QID) | ORAL | Status: DC
  Administered 2019-02-07 – 2019-02-08 (×4): 500 mg via ORAL
  Filled 2019-02-06 (×7): qty 1

## 2019-02-06 MED ORDER — IPRATROPIUM BROMIDE 0.02 % IN SOLN: 0.5000 mg | Freq: Three times a day (TID) | RESPIRATORY_TRACT | Status: DC | PRN

## 2019-02-06 MED ORDER — DEXTROSE 50 % IV SOLN
INTRAVENOUS | Status: AC
  Filled 2019-02-06: qty 50

## 2019-02-06 MED ORDER — COLLAGENASE 250 UNIT/GM EX OINT
TOPICAL_OINTMENT | Freq: Every day | CUTANEOUS | Status: DC
  Administered 2019-02-07 – 2019-02-08 (×2): via TOPICAL
  Administered 2019-02-09: 1 via TOPICAL
  Administered 2019-02-10 – 2019-02-13 (×3): via TOPICAL
  Filled 2019-02-06 (×2): qty 30

## 2019-02-06 MED ORDER — LEVALBUTEROL HCL 0.63 MG/3ML IN NEBU: 0.6300 mg | INHALATION_SOLUTION | Freq: Three times a day (TID) | RESPIRATORY_TRACT | Status: DC | PRN

## 2019-02-06 MED ORDER — ACETAMINOPHEN 10 MG/ML IV SOLN
1000.0000 mg | Freq: Once | INTRAVENOUS | Status: AC
  Administered 2019-02-06: 1000 mg via INTRAVENOUS
  Filled 2019-02-06: qty 100

## 2019-02-06 MED ORDER — KETOROLAC TROMETHAMINE 15 MG/ML IJ SOLN: 15.0000 mg | Freq: Three times a day (TID) | INTRAMUSCULAR | Status: AC | PRN

## 2019-02-06 NOTE — Care Management Important Message (Signed)
Important Message  Patient Details IM Letter given to Rhea Pink SW to present to the Patient Name: Sydney [name] MRN: [id number] Date of Birth: [birth year]   Medicare Important Message Given:  Yes     Kerin Salen 02/06/2019, 1:31 PM

## 2019-02-06 NOTE — Progress Notes (Signed)
Pt's daughter asked that tramadol be given for pain, med at bedside in applesauce and pt refused to take, wasted and 2nd verified bt T Dark RN

## 2019-02-06 NOTE — Progress Notes (Signed)
SLP Cancellation Note  Patient Details Name: Sydney [name] MRN: [id number] DOB: 03/19/1931   Cancelled treatment:       Reason Eval/Treat Not Completed: Other (comment). Per RN, Pt is refusing oral care and po presentations. Pt poorly responsive and uncooperative, and is therefore inappropriate for swallow evaluation at this time. Will continue efforts.  Celia B. Quentin Ore, [facility], [place] Speech Language Pathologist Office: [phone] Pager: [phone]  Shonna Chock 02/06/2019, 11:04 AM

## 2019-02-06 NOTE — Progress Notes (Signed)
Pt will not let RT do breathing tx. Pt covering her head up with bed covers and yelling leave me alone. No distress noted at this time.

## 2019-02-06 NOTE — Progress Notes (Signed)
Patient ID: Sydney [name], female   DOB: [birth year], 85 y.o.   MRN: [id number]       Subjective: Patient yells out in pain when she gets moved.    Objective: Vital signs in last 24 hours: Temp:  [97.7 F (36.5 C)-99 F (37.2 C)] 98.7 F (37.1 C) (10/12 1343) Pulse Rate:  [67-93] 67 (10/12 1343) Resp:  [20-22] 20 (10/12 1343) BP: (126-135)/(67-81) 129/67 (10/12 1343) SpO2:  [95 %-100 %] 100 % (10/12 1343) Last BM Date: 01/30/19(per RN report )  Intake/Output from previous day: 10/11 0701 - 10/12 0700 In: 1458.3 [I.V.:685.4; IV Piggyback:772.9] Out: 275 [Urine:275] Intake/Output this shift: Total I/O In: 0  Out: 350 [Urine:350]  PE: Skin: wound has some granulation tissue present on the left side of the wound.  Over her sacrum is some necrotic fibrin that is very adherent to the bone.  There is about 1.5-2cm of undermining around the wound and tracking of the wound down towards her rectum.  No acute purulent drainage c/w infection.  Lab Results:  Recent Labs    02/05/19 0525 02/06/19 0459  WBC 22.5* 15.1*  HGB 8.9* 8.9*  HCT 27.2* 27.5*  PLT 212 204   BMET Recent Labs    02/05/19 0525 02/06/19 0459  NA 142 142  K 3.3* 3.1*  CL 110 109  CO2 19* 23  GLUCOSE 95 100*  BUN 22 18  CREATININE 1.08* 0.80  CALCIUM 8.0* 7.9*   PT/INR No results for input(s): LABPROT, INR in the last 72 hours. CMP     Component Value Date/Time   NA 142 02/06/2019 0459   K 3.1 (L) 02/06/2019 0459   CL 109 02/06/2019 0459   CO2 23 02/06/2019 0459   GLUCOSE 100 (H) 02/06/2019 0459   BUN 18 02/06/2019 0459   CREATININE 0.80 02/06/2019 0459   CREATININE 0.67 01/03/2014 1111   CALCIUM 7.9 (L) 02/06/2019 0459   PROT 5.6 (L) 02/06/2019 0459   ALBUMIN 2.0 (L) 02/06/2019 0459   AST 30 02/06/2019 0459   ALT 8 02/06/2019 0459   ALKPHOS 112 02/06/2019 0459   BILITOT 1.0 02/06/2019 0459   GFRNONAA >60 02/06/2019 0459   GFRAA >60 02/06/2019 0459   Lipase     Component Value  Date/Time   LIPASE 123 (H) 12/20/2018 0012       Studies/Results: No results found.  Anti-infectives: Anti-infectives (From admission, onward)   Start     Dose/Rate Route Frequency Ordered Stop   02/04/19 1400  metroNIDAZOLE (FLAGYL) IVPB 500 mg     500 mg 100 mL/hr over 60 Minutes Intravenous Every 8 hours 02/04/19 1343     02/03/19 1500  ceFAZolin (ANCEF) IVPB 1 g/50 mL premix     1 g 100 mL/hr over 30 Minutes Intravenous Every 8 hours 02/03/19 1444         Assessment/Plan FTT SPCM  Stage 4 sacral decubitus ulcer -cont PT hydrotherapy for now -add daily santyl -DC iodoform packing and start WD dressing changes to the wound BID -will re-eval on Wednesday or Thursday to see what kind of progress is being made.    LOS: 4 days    Henreitta Cea , [facility] 02/06/2019, 2:51 PM Pager: [phone]

## 2019-02-06 NOTE — Progress Notes (Signed)
Attempted to get pt for MRI at 902pm. Pt was moving all in the bed and swatting [place] away. She stated she wanted her mommy in heaven. Spoke with RN and she said pt daughter has to be called before giving any meds and she was just medicated around 8. Will attempt to call tomorrow and see if pt can be still. The last 3 MRI were not successful due to moving and in pain.

## 2019-02-06 NOTE — Progress Notes (Signed)
[name] for Infectious Disease   Reason for visit: follow up on wound infection  Interval History: culture growth with MSSA and Strep viridans.  AFebrile, WBC down to 15.1.  Getting hydrotherapy now.    Physical Exam: Constitutional:  Vitals:   02/06/19 0720 02/06/19 1343  BP:  129/67  Pulse:  67  Resp:  20  Temp:  98.7 F (37.1 C)  SpO2: 98% 100%   patient appears in NAD Skin: no rashes Eyes: anicteric Neuro: alert MS: no edema  Review of Systems: Constitutional: negative for fevers and chills Gastrointestinal: negative for nausea and diarrhea Integument/breast: negative for rash  Lab Results  Component Value Date   WBC 15.1 (H) 02/06/2019   HGB 8.9 (L) 02/06/2019   HCT 27.5 (L) 02/06/2019   MCV 98.6 02/06/2019   PLT 204 02/06/2019    Lab Results  Component Value Date   CREATININE 0.80 02/06/2019   BUN 18 02/06/2019   NA 142 02/06/2019   K 3.1 (L) 02/06/2019   CL 109 02/06/2019   CO2 23 02/06/2019    Lab Results  Component Value Date   ALT 8 02/06/2019   AST 30 02/06/2019   ALKPHOS 112 02/06/2019     Microbiology: Recent Results (from the past 240 hour(s))  SARS CORONAVIRUS 2 (TAT 6-24 HRS) Nasopharyngeal Nasopharyngeal Swab     Status: None   Collection Time: 02/02/19  6:43 PM   Specimen: Nasopharyngeal Swab  Result Value Ref Range Status   SARS Coronavirus 2 NEGATIVE NEGATIVE Final    Comment: (NOTE) SARS-CoV-2 target nucleic acids are NOT DETECTED. The SARS-CoV-2 RNA is generally detectable in upper and lower respiratory specimens during the acute phase of infection. Negative results do not preclude SARS-CoV-2 infection, do not rule out co-infections with other pathogens, and should not be used as the sole basis for treatment or other patient management decisions. Negative results must be combined with clinical observations, patient history, and epidemiological information. The expected result is Negative. Fact Sheet for Patients:  SugarRoll.be Fact Sheet for Healthcare Providers: https://www.woods-mathews.com/ This test is not yet approved or cleared by the [place] FDA and  has been authorized for detection and/or diagnosis of SARS-CoV-2 by FDA under an Emergency Use Authorization (EUA). This EUA will remain  in effect (meaning this test can be used) for the duration of the COVID-19 declaration under Section 56 4(b)(1) of the Act, 21 U.S.C. section 360bbb-3(b)(1), unless the authorization is terminated or revoked sooner. Performed at [facility], [place] [address] [place], [place] [address]   Aerobic Culture (superficial specimen)     Status: None   Collection Time: 02/03/19 11:46 AM   Specimen: Sacral; Wound  Result Value Ref Range Status   Specimen Description   Final    SACRAL Performed at [place] [address], [place], [place] [address]    Special Requests   Final    NONE Performed at [facility], [place] [address], [place], [place] [address]    Gram Stain   Final    RARE WBC PRESENT,BOTH PMN AND MONONUCLEAR ABUNDANT GRAM POSITIVE COCCI IN PAIRS ABUNDANT GRAM VARIABLE ROD Performed at [facility], [place] [address] [place], [place] [address]    Culture   Final    ABUNDANT STREPTOCOCCUS ANGINOSIS FEW STAPHYLOCOCCUS AUREUS    Report Status 02/06/2019 FINAL  Final   Organism ID, Bacteria STREPTOCOCCUS ANGINOSIS  Final   Organism ID, Bacteria STAPHYLOCOCCUS AUREUS  Final      Susceptibility   Staphylococcus aureus - MIC*    CIPROFLOXACIN <=0.5 SENSITIVE Sensitive     ERYTHROMYCIN <=0.25 SENSITIVE Sensitive     GENTAMICIN <=0.5 SENSITIVE Sensitive     OXACILLIN 0.5 SENSITIVE Sensitive     TETRACYCLINE <=1 SENSITIVE Sensitive     VANCOMYCIN <=0.5 SENSITIVE Sensitive     TRIMETH/SULFA <=10 SENSITIVE Sensitive     CLINDAMYCIN <=0.25 SENSITIVE Sensitive     RIFAMPIN <=0.5 SENSITIVE Sensitive      Inducible Clindamycin NEGATIVE Sensitive     * FEW STAPHYLOCOCCUS AUREUS   Streptococcus anginosis - MIC*    PENICILLIN <=0.06 SENSITIVE Sensitive     ERYTHROMYCIN <=0.12 SENSITIVE Sensitive     LEVOFLOXACIN 0.5 SENSITIVE Sensitive     VANCOMYCIN 0.5 SENSITIVE Sensitive     * ABUNDANT STREPTOCOCCUS ANGINOSIS    Impression/Plan:  1. Wound infection - growth noted and will changes to oral Keflex.  Can treat for 7 more days.  Continue hydrotherapy.   2.  Wound - poor healing.  Will need much improved nutrition.    3.  Leukocytosis - Improved some but anticipate it could go up again with ongoing hydrotherapy.    I will sign off, call with any questions.

## 2019-02-06 NOTE — Progress Notes (Signed)
PT hydrotherapy note:   02/06/19 1600  Subjective Assessment  Subjective per dtr patient not the same today, moaning out in sleep, and not opening her eyes or talking as much yesterday adn today. Pt nodded her head she was comfortable after session before leaving   Patient and Family Stated Goals per dtr "We want mama to get better and be able to walk and this wound will heal"  Evaluation and Treatment  Evaluation and Treatment Procedures Explained to Patient/Family Yes  Evaluation and Treatment Procedures agreed to  Pressure Injury 02/02/19 Buttocks Left Unstageable - Full thickness tissue loss in which the base of the ulcer is covered by slough (yellow, tan, gray, green or brown) and/or eschar (tan, brown or black) in the wound bed.  Date First Assessed/Time First Assessed: 02/02/19 2213   Location: Buttocks  Location Orientation: Left  Staging: Unstageable - Full thickness tissue loss in which the base of the ulcer is covered by slough (yellow, tan, gray, green or brown) and/or e...  Dressing Type NEW !!!   Foam - Lift dressing to assess site every shift;Moist to dry;Gauze (Comment) 4x4 with santyl for packing ( open in up thin and gently pack w/Qtip)  Dressing Changed  Dressing Change Frequency Twice a day (we do 1x and nursing performs the  second time )  State of Healing Non-healing  Site / Wound Assessment Yellow;Painful;Purple;Dusky  % Wound base Red or Granulating 10%  % Wound base Yellow/Fibrinous Exudate 40%  % Wound base Other/Granulation Tissue (Comment)  (unable to visual all tissues due to undermining)  Drainage Amount Moderate  Drainage Description Purulent;No odor  Treatment Cleansed;Debridement (Selective);Packing (Saline gauze);Off loading;Hydrotherapy (Pulse lavage)  Hydrotherapy  Pulsed lavage therapy - wound location sacrum   Pulsed Lavage with Suction (psi) 8 psi (varied between 4-8 for pt tolerance )  Pulsed Lavage with Suction - Normal Saline Used 1000 mL   Pulsed Lavage Tip Tip with splash shield  Selective Debridement  Selective Debridement - Location sacrum   Selective Debridement - Tools Used Forceps;Scissors  Selective Debridement - Tissue Removed stringy sloth  Wound Therapy - Assess/Plan/Recommendations    Wound Therapy - Clinical Statement Pt's dtr reports pt not the same yesterday and today. Not as verbal or alert and sleeping but moaning nad reaching out in in what the dtr thinks is pain periodically. Pt did tolerate session well today , was in sidelying for about 1 hour for PA Claiborne Billings present)  and MD ( Sydney [name]) assessment and discussion with dtr, as well as our treatment. Wound still withundermining unknown and 2 smaller openings near rectum seem to be thinning in skin to appear as though they may open soon which wil make the opening larger. Pt with pain especially with pressure along all boney prominence  ( which is a lot in this area) .    Wound Therapy - Functional Problem List decreased mobility and nutrition   Factors Delaying/Impairing Wound Healing Immobility;Multiple medical problems  Hydrotherapy Plan Debridement;Dressing change;Patient/family education;Pulsatile lavage with suction  Wound Therapy - Frequency 6X / week  Wound Therapy - Current Recommendations Case manager/social work;PT;OT  Wound Therapy - Follow Up Recommendations Skilled nursing facility (Dtr would like to take pt home with wound care and [facility] )  Sydney [name], Sydney [name] Pager: [phone] Office: [phone] 02/06/2019

## 2019-02-06 NOTE — Progress Notes (Addendum)
PROGRESS NOTE    Sydney [name]  JJK:[id number] DOB: 1930/07/21 DOA: 02/02/2019 PCP: [name], MD  Brief Narrative:  HPI per Dr. Orene Desanctis on 02/02/2019 Sydney [name] is a 85 y.o. 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  Daughter who is her HCPOA provides most of the history.  Patient was recently hospitalized from 12/19/2018 and discharged 2 days ago on 10/6.  She was admitted for progressive weakness, malaise, fatigue and significant failure to thrive.  She also developed encephalopathy and worsening liver enzymes.  She had an extensive work-up and was seen by numerous subspecialists including ID, GI, oncology and neurology without any significant improvement in her failure to thrive syndrome.  She was discharged home with home health given daughter was resistant to SNF and hospice care.  Since discharge, daughter reports that she has not had any acute changes.  Her appetite continues to be poor.  Daughter was helping her to get supplemental home health nursing and had a nurse come evaluate her.  The nurse was concerned about wheezing on her respiratory exam and her sacral wound prompting her to come into the ED.  Daughter was unsure whether she was hypoxic at home.  Denies any fever, cough or shortness of breath.  Denies any chest pain.  Denies any nausea, vomiting or abdominal pain.  Patient has chronic diarrhea from her history of C. difficile with stool transplant and daughter has noticed increased frequency of her bowel movements. Daughter would like her to get her sacral wound care for and her goal was for her mother to be able to ambulate on her own. She does not want her mother to receive any narcotics or opioids specifically morphine and tramadol since she felt it sedated her mother during prior admission.  Also does not want  Ativan. Daughter states that she strongly wants [place] to consult infectious disease and GI prior to starting any antibiotics given her history.  ED Course: She was afebrile and had elevated blood pressure up to 164/100.  Also tachycardic up to 120s. CBC showed leukocytosis of 13.7 and hemoglobin of 9.5 which is around her baseline.  CMP showed potassium of 3.2, mildly elevated alkaline phosphatase 158 which is around her baseline.  Chest x-ray showed evidence of mild interstitial edema and small bilateral pleural effusion with significant interval improvement in the left effusion.  Central lung infection is possible but less likely.  Mild stable cardiomegaly. CT pelvis showed small soft tissue ulcer posterior to the left of the coccyx with a 1.9 x 1 x 0.4 cm abscess slightly more inferior.  No evidence of osteomyelitis.  Diffuse subcutaneous edema.  **Interim History General surgery was consulted for further evaluation of her sacral wound and abscess.  Patient's daughter would not let me put her on any antibiotics unless I spoke with ID so ID was consulted and as a courtesy GI was consulted as well and Dr. Carlean Purl saw the patient briefly and talk with her daughter as a Manufacturing engineer.  Antibiotics were started with IV cefazolin and cultures were sent as her wound abscess was spontaneously draining.  Patient started having some respiratory distress likely due to intermittent mucous plugging because of her failure to thrive and ability to cough up her secretions as well as mild volume overload so Pulmonary was consulted for further evaluation she is transferred to telemetry for continuous pulse  oximetry monitoring.    Flutter valve is recommended however patient daughter refused and repositioning refused.  Patient's daughter is only requesting the patient be given Tylenol.  Ethics was consulted for further evaluation as guidance was sought and [employer] spoke to me at length and recommended discharging the patient  home as soon as possible pending culture results and having a Education officer, museum come out to evaluate no living situation.  Patient is incredibly frail debilitated and has severe failure to thrive and has had significant poor p.o. intake.  She is visibly in pain despite this but daughter is refusing medications for the patient.  An appropriate approach would be to transition the patient to comfort measures however patient daughter declined.  I reconsulted palliative care for further evaluation and recommendations and Dr. Domingo Cocking met with the patient and the daughter and they are to continue full scope of treatment currently.  Patient's daughter did not want to discuss hospice support for mother and did not want any further involvement from a palliative care team either but Dr. Domingo Cocking provided contact information and provided a note regarding the outpatient palliative services to the hospital at [facility].  Patient's daughter continues to have unrealistic goals and has not come to terms with the patient's mortality and feels that the patient can get better and walk and have her wound care healed and get back to her normal activities of daily living despite her continued failure to thrive and high risk for aspiration.    General surgery evaluated and had a lengthy discussion with the patient's daughter that her sacral wound and Dr. gross felt that the wound will never heal given her nutritional status and failure to thrive despite nursing interventions multidisciplinary evaluations.  The daughter continues not to want to hear bad news and refuses to believe that her mother is this sick and wanted to focus on her mother's "restoration".  I brought up the fact that the patient had a history of heart failure and the patient's daughter refused to believe this even though this is documented back in her echocardiogram in 2016.  Patient's daughter refused to believe that she had a diagnosis of heart failure and felt that there  is nothing wrong with the patient's heart even though I described that she could get volume overloaded given her poor ventricular relaxation due to her diastolic heart failure.  She continues to believe that the patient came to bounce back from her significant weakness and get to a prior level where she was independent of her ADLs.  ID recommending was ceftezole and and changing to oral therapy when sensitivities are identified and because she grew out MSSA and Strep Viridans Dr. Linus Salmons recommended changing to oral Keflex and recommended 7 more days of treatment.  Flagyl was discontinued   Leukocytosis now trending down because patient continues to have acute urinary retention so Catheter was placed yesterday. Gentle IVF Hydration was resumed and now stopped this AM.  She received another hydrotherapy treatment and daughter requested that she be given some fentanyl IV so IV Toradol was given x1.  Patient was resting and became agitated and awoken by the patient's daughter as she kept probing her and trying to wake her up.  Patient's daughter became extremely concerned that the patient was not acting "normal" and became extremely anxious due to her mother's " acute change in mental status".  Patient was assessed and she had no acute changes from baseline and no focal deficits however because the patient's daughter  persistently stated that she had a change from her baseline and MRI was ordered and the case was discussed with neurology who will evaluate but Dr. Lorraine Lax feels like we should hold off on the EEG for now as she is just significantly worked up and last hospitalization.  Assessment & Plan:   Principal Problem:   Failure to thrive in adult Active Problems:   Protein calorie malnutrition (HCC)   Ataxia   MDD (major depressive disorder)   Sacral ulcer (HCC)   SOB (shortness of breath)   Wheezing   TIA (transient ischemic attack)   PVD (peripheral vascular disease) (HCC)  Failure to  thrive/deconditioning/dehydration with Hypoglycemia from poor po intake, worsening -Patient had extensive work-up without any findings with numerous specialist at last admission. Reportedly felt that patient has met maximal benefit during last hospital stay and was discharged and Ethics involved.  -Daughter declined NG or PEG tube (not a candidate after discussion with GI) and refused hospice at discharge. Ethics committee was noted to be involved. See last d/c note for full details.  -Pt was tachycardiac but likely secondary to dehydration. No fever but Leukocytosis is worsening and could be in the setting of IV steroid demargination possible aspiration -Consult dietician - SLP for swallow study- pt high risk for aspiration will allow for dysphagia 3 diet and have the daughter only feed the patient; speech therapy evaluated and started the patient was very fatigued and anxious and with her mouth and lips being severely dry intake was expected to be poor and insufficient fo any type of progress.  Speech therapy will continue efforts to attempt swallow evaluation in next 1 to 2 days and will continue as I have the patient's daughter feed the patient with a dysphagia 3 diet with basic precautions and risk of aspirations were discussed given that the patient has been very poor cough reflex and cannot really mobilize her secretions; SLP evaluation is still pending -Wound care for her sacral wound -PT/OT recommending Home Health as daughter refusing SNF -Continue Megace -Stopped IVF hydration with IV Sodium Bicarbonate at 50 mL/hr again; given a dose of IV Lasix yesterday and likely over diuresed  Acute Respiratory Failure with Hypoxia and Cough in setting of known diastolic dysfunction along with poor mobilization of secretions from failure to thrive and generalized weakness -CXR was read as"Enlarged cardiac silhouette. Calcific atherosclerotic disease of the aorta. Bilateral moderate in size pleural  effusions. Worsening aeration of the lungs with focal airspace opacity in the right upper lobe. Mild pulmonary edema. Osseous structures are without acute abnormality. Soft tissues are grossly normal." by the Radiologist -Lasix was stopped and fluids were started and now stopped this morning -Pulmonary feels Pleural effusions are small and that she has vascular congestion that is probably contributing to her intermittent Hypoxemia but Dr. Lamonte Sakai believes that the cause of this is likely related to some degree of diastolic dysfunction but more so malnutrition and poor oncotic pressures as well as possibly noncardiac inflammatory edema due to ongoing infection or intermittent aspiration.  He suspects that the biggest contributor to her recent acute change in oxygen need relation to her impaired airway protection, mucus and secretion management given her malnourishment nonhealing wound status and progressive weakness; I have personally reviewed her chest x-ray and I am in agreement with Dr. Lamonte Sakai -Flutter Valve, Incentive Spirometry, and Guaifenesin -Temporizing measures with suctioning and pulmonary hygiene -Continue with speech therapy and aspiration precautions and will allow for dysphagia 3 diet with the daughter knowing  that she can likely aspirate -Pulmonary feels no definitive role for CT imaging and do not feel that that to make any remarkable change and improvement on his nutritional status is improved, strength building and physical therapy along with adequate management of her underlying infection and Dr. Corky Sox expects progressive weakening, worsening airway protection and aspiration -Pulmonary has recommended a comfort based approach and the daughter has adamantly refused -We will hold further doses of IV fluid and IV diuretics for now -Repeat echocardiogram today showed an EF of 50 to 55% with concentric left ventricle hypertrophy and spectral Doppler parameters show left ventricular diastolic  upper parameters are indeterminate pattern of left ventricular diastolic filling -If necessary we will discharge the patient with oxygen at home; she was on 4 L of oxygen via nasal cannula yesterday and today was on 2 L -Case was discussed with Ethics [employer] who feels that the most appropriate course of action would be to find out what culture she is going to treat her with p.o. antibiotics and discharge her home with oxygen if necessary knowing that she will likely come back to the hospital given the daughter's opposition to hospice and comfort based measures; Ethics physician Dr. Madison Hickman to evaluate tomorrow  -Patient should really transition to comfort care measures at this time however patient daughter is adamant and opposed and will stabilize the patient and after he can be discharged home once cultures are resulted -Repeat chest x-ray 10/10 showed "No significant change in dense left lower lobe atelectasis or pneumonia. No significant change in probable pneumonia in the right upper lobe and both perihilar regions. Alveolar pulmonary edema is less likely in the absence of pulmonary vascular congestion and interstitial pulmonary edema. Stable small right pleural effusion and probable small left pleural effusion.Stable cardiomegaly." -Continue with supplemental oxygen via nasal cannula and wean O2 as tolerated -Started the patient on IV Flagyl for aspiration based on ID recommendations and he was continued for 3 days and stopped today  -Continue to monitor respiratory status carefully -Strict I's and O's and daily weights  Metabolic Acidosis, improved and stable -In the setting of dehydration and poor p.o. intake Patient CO2 of 13, anion gap was 14 and chloride levels 113 -Now CO2 is 23, anion gap is 10 and chloride level 109 -IV fluids now discontinued -Continue monitor and trend repeat CMP in a.m.  Acute Urinary Retention -I and O Cath today however she may require a Foley catheter  as she is required several and will cath -Status post IV Lasix but will hold further diuresis -Renal U/S done and showed mild hydronephrosis and I spoke with urology Dr. Lovena Neighbours who recommends continuing current plan of care and no further intervention with stenting necessary -Continue to Monitor Strict I's and O's -Place Foley catheter given continue urinary retention -Remove Foley next few days and do a trial of void  Sacral Decubitus Ulcer Stage4 with a Tract and Now Abscess -Continue local wound care-appreciate wound care RN input and Surgical Evaluation -She continues to have discomfort at the sacral site and Daughter refuses any medication but Tylenol -Cx's sent and they have been reintubated for further growth -Started Empiric Abx with Cefazolin per ID Recc's -Initial Gram Stain showed "RARE WBC PRESENT,BOTH PMN AND MONONUCLEAR  ABUNDANT GRAM POSITIVE COCCI IN PAIRS  ABUNDANT GRAM VARIABLE RODS" with Cx's showing staph aureus mainly Strep Viridans and antibiotics were changed to p.o. Keflex by Dr. Linus Salmons -Hydrotherapy recommended by General Surgery and this is to be done  today -Dr. Johney Maine had a extremely long discussion with the patient's daughter about the wound and her prognosis and daughter did not want to hear any bad news and only wanted to focus on the positive" speak life" over the patient  Deconditioning/Debility -Secondary to prolonged hospitalization/acute illness-and underlying failure to thrive syndrome.   -PT evaluated the patient recommending Home Health as patient's daughter will not take her to SNF -Prognosis is extremely poor patient is going to become weaker and more debilitated with her poor p.o. intake and she has a very high risk of inpatient decompensation and worsening  Severe Malnutrition in the Context of Chronic Illness -Nutritionist consulted for further evaluation and recommendations -Nutritionist recommended ordering Ensure Enlive BID, Magic Cup BID, and  Juven BID and recommending Floor staff to assist with feeding as needed if daughter is not present   Hypokalemia  -K+ was 3.1 -Replete with IV KCl again today -Checked Mag Level and was 1.9 -Continue to Monitor and Replete as Necessary -Repeat CMP in AM   Hypo-phosphatemia -Mild at 2.4 -Replete with p.o. K-Phos Neutral 500 mg x1 -Continue monitor and replete as necessary -Repeat phosphorus level in a.m.  Oral Thrush -Nystatin suspension continue  Normocytic Anemia -Hemoglobin of 9.5 on admission and now Hgb/Hct essentially stable at 8.9/27.5 -Continue to monitor CBC  HTN -stable without antihypertensive  History of C. difficile colitis s/p stool transplantation -Continues to have loose stool and daughter endorse increased frequency in the past few days.  However, she does not have a fever or abdominal pain to suggest recurrent C. difficile colitis -Infectious disease recommendation at last admission was to continue Imodium as needed -GI has no hesitation using Abx per Dr. Celesta Aver note and she is now on ceftriaxone and IV Flagyl which has now been stopped and changed to p.o. Keflex; Flagyl now discontinued  History of TIA -Continue aspirin and Plavix   Leukocytosis, now improving -Worsened and likely in the setting of steroid demargination but also could be from aspiration and possible aspiration pneumonia as well and her sacral wound  -patient was on IV cefazolin which was changed to p.o. Keflex and we added IV Flagyl based on ID recommendation but this is now been stopped at [facility] discretion -Continue monitor and trend WBC and has now trended down to 15.1 -Repeat CBC in a.m. and continue to monitor for signs and symptoms of infection  Renal Insufficiency/AKI improved -Mild and likely worsened in setting of IV Lasix -Patient will be started on D5 W at 50 mL's per hour we will give a dose of IV Lasix will continue D5W given that she continues to be  hypoglycemic -Patient's BUN/creatinine is now 18/0.80 Can avoid nephrotoxic medications, contrast dyes, hypotension if possible -Continue monitor and trend renal function -Repeat CMP in a.m.  Lethargy and daughter's concern for change in mental status -No focal deficits were identified and patient was awake and alert and responsive and talking to me however daughter is insistent that she had a acute change and now actually complaining of pain -Nursing reports no acute changes on their shift and patient has been essentially the same throughout the whole hospitalization -Will obtain an MRI  And have Neurology Evaluate    DVT prophylaxis: SCDs Code Status: FULL CODE Family Communication: No family at bedside during examination  Disposition Plan: Anticipate D/C Home in the next 24-48 hours pending Cx results and improvement   Consultants:   General Surgery Dr. Johney Maine  Infectious Diseases  Pulmonary  Discussed with Gastroenterology  Dr. Carlean Purl  Discussed with Palliative Care Dr. Hilma Favors; formal consultation with Dr. Harrison Mons -Spoke with Idolina Primer  Neurology   Procedures: Hydrotherapy   Antimicrobials:  Anti-infectives (From admission, onward)   Start     Dose/Rate Route Frequency Ordered Stop   02/06/19 1800  cephALEXin (KEFLEX) capsule 500 mg     500 mg Oral Every 6 hours 02/06/19 1500     02/04/19 1400  metroNIDAZOLE (FLAGYL) IVPB 500 mg     500 mg 100 mL/hr over 60 Minutes Intravenous Every 8 hours 02/04/19 1343     02/03/19 1500  ceFAZolin (ANCEF) IVPB 1 g/50 mL premix  Status:  Discontinued     1 g 100 mL/hr over 30 Minutes Intravenous Every 8 hours 02/03/19 1444 02/06/19 1500     Subjective: Patient seen and examined at bedside patient was resting and startle when I woke her up from sleep and became slightly agitated.  Denied any pain to me currently and has been urinating without a Foley catheter.  No daughter was at bedside today however later on afternoon  her daughter felt that there was "something wrong with her mother" and that she was "not acting normal."  Nursing reported no change in mental status and patient was wanting to rest but daughter kept agitating her mother.  Patient daughter asked "mama to speak to me" and the patient stated "I cannot speak" and is worried the daughter even more.  There is no acute changes noted and no focal neurological deficits could be appreciated.  Patient daughter then also refused pain medication for the patient.  No other concerns or complaints at this time  Objective: Vitals:   02/06/19 0432 02/06/19 0715 02/06/19 0720 02/06/19 1343  BP: 126/76   129/67  Pulse: 93   67  Resp: (!) 22   20  Temp: 97.7 F (36.5 C)   98.7 F (37.1 C)  TempSrc:    Axillary  SpO2:  98% 98% 100%  Weight:      Height:        Intake/Output Summary (Last 24 hours) at 02/06/2019 1848 Last data filed at 02/06/2019 1632 Gross per 24 hour  Intake 1758.27 ml  Output 350 ml  Net 1408.27 ml   Filed Weights   02/02/19 1345  Weight: 52.2 kg   Examination: Physical Exam:  Constitutional: Extremely thin and cachectic frail elderly AAF in NAD and appears calm but a little uncomfortable Eyes: Lids and conjunctivae normal, sclerae anicteric  ENMT: External Ears, Nose appear normal. Grossly normal hearing. Mucous membranes are dry somewhat Neck: Appears normal, supple, no cervical masses, normal ROM, no appreciable thyromegaly; no JVD Respiratory: Diminished to auscultation bilaterally with coarse breath sounds and rhonchi worse on the left compared to the right;,. Normal respiratory effort and patient is not tachypenic. No accessory muscle use. Wearing 2 liters of supplemental O2 via [place] Cardiovascular: RRR, no murmurs / rubs / gallops. S1 and S2 auscultated.  Abdomen: Soft, non-tender, non-distended.  Bowel sounds positive and slightly hyperactive.  GU: Deferred. Musculoskeletal: No clubbing / cyanosis of digits/nails. No joint  deformities on the upper and lower extremities.  Skin: Has a Sacral Decubitus ulcer. No induration; Warm and dry.  Neurologic: CN 2-12 grossly intact with no focal deficits.  Romberg sign and cerebellar reflexes not assessed.  Psychiatric: Impaired judgment and insight. Alert and awake. Calm mood and appropriate affect.    Data Reviewed: I have personally reviewed following labs and imaging studies  CBC: Recent  Labs  Lab 02/02/19 1538 02/03/19 0257 02/04/19 0540 02/05/19 0525 02/06/19 0459  WBC 13.7* 17.8* 23.8* 22.5* 15.1*  NEUTROABS 12.2*  --  21.9* 19.9* 13.8*  HGB 9.5* 11.7* 9.7* 8.9* 8.9*  HCT 30.3* 35.8* 29.7* 27.2* 27.5*  MCV 99.3 96.2 95.5 95.8 98.6  PLT 245 193 233 212 962   Basic Metabolic Panel: Recent Labs  Lab 02/02/19 1538 02/03/19 0257 02/04/19 0540 02/05/19 0525 02/06/19 0459  NA 137 140 142 142 142  K 3.2* 3.4* 3.4* 3.3* 3.1*  CL 114* 113* 110 110 109  CO2 13* 13* 19* 19* 23  GLUCOSE 85 67* 146* 95 100*  BUN _0 CREATININE 0.82 0.83 0.95 1.08* 0.80  CALCIUM 7.8* 8.1* 8.1* 8.0* 7.9*  MG  --  1.9 1.9 1.8 2.0  PHOS  --  3.4 3.0 2.8 2.4*   GFR: Estimated Creatinine Clearance: 41.6 mL/min (by C-G formula based on SCr of 0.8 mg/dL). Liver Function Tests: Recent Labs  Lab 02/02/19 1538 02/03/19 0257 02/04/19 0540 02/05/19 0525 02/06/19 0459  AST 41 39 35 35 30  ALT _1 ALKPHOS 158* 144* 132* 122 112  BILITOT 1.0 0.9 1.0 1.1 1.0  PROT 6.5 6.3* 6.5 6.3* 5.6*  ALBUMIN 2.3* 2.3* 2.4* 2.3* 2.0*   No results for input(s): LIPASE, AMYLASE in the last 168 hours. No results for input(s): AMMONIA in the last 168 hours. Coagulation Profile: No results for input(s): INR, PROTIME in the last 168 hours. Cardiac Enzymes: No results for input(s): CKTOTAL, CKMB, CKMBINDEX, TROPONINI in the last 168 hours. BNP (last 3 results) No results for input(s): PROBNP in the last 8760 hours. HbA1C: No results for input(s): HGBA1C in the last 72  hours. CBG: Recent Labs  Lab 02/05/19 2358 02/06/19 0504 02/06/19 0800 02/06/19 1225 02/06/19 1608  GLUCAP 113* 86 98 79 72   Lipid Profile: No results for input(s): CHOL, HDL, LDLCALC, TRIG, CHOLHDL, LDLDIRECT in the last 72 hours. Thyroid Function Tests: No results for input(s): TSH, T4TOTAL, FREET4, T3FREE, THYROIDAB in the last 72 hours. Anemia Panel: No results for input(s): VITAMINB12, FOLATE, FERRITIN, TIBC, IRON, RETICCTPCT in the last 72 hours. Sepsis Labs: Recent Labs  Lab 02/02/19 2005  LATICACIDVEN 1.5    Recent Results (from the past 240 hour(s))  SARS CORONAVIRUS 2 (TAT 6-24 HRS) Nasopharyngeal Nasopharyngeal Swab     Status: None   Collection Time: 02/02/19  6:43 PM   Specimen: Nasopharyngeal Swab  Result Value Ref Range Status   SARS Coronavirus 2 NEGATIVE NEGATIVE Final    Comment: (NOTE) SARS-CoV-2 target nucleic acids are NOT DETECTED. The SARS-CoV-2 RNA is generally detectable in upper and lower respiratory specimens during the acute phase of infection. Negative results do not preclude SARS-CoV-2 infection, do not rule out co-infections with other pathogens, and should not be used as the sole basis for treatment or other patient management decisions. Negative results must be combined with clinical observations, patient history, and epidemiological information. The expected result is Negative. Fact Sheet for Patients: SugarRoll.be Fact Sheet for Healthcare Providers: https://www.woods-mathews.com/ This test is not yet approved or cleared by the [place] FDA and  has been authorized for detection and/or diagnosis of SARS-CoV-2 by FDA under an Emergency Use Authorization (EUA). This EUA will remain  in effect (meaning this test can be used) for the duration of the COVID-19 declaration under Section 56 4(b)(1) of the Act, 21 U.S.C. section 360bbb-3(b)(1), unless the authorization is terminated  or revoked  sooner. Performed at [facility], [place] [address] [place], [place] [address]   Aerobic Culture (superficial specimen)     Status: None   Collection Time: 02/03/19 11:46 AM   Specimen: Sacral; Wound  Result Value Ref Range Status   Specimen Description   Final    SACRAL Performed at [place] [address], [place], [place] [address]    Special Requests   Final    NONE Performed at [facility], [place] [address], [place], [place] [address]    Gram Stain   Final    RARE WBC PRESENT,BOTH PMN AND MONONUCLEAR ABUNDANT GRAM POSITIVE COCCI IN PAIRS ABUNDANT GRAM VARIABLE ROD Performed at [facility], [place] [address] [place], [place] [address]    Culture   Final    ABUNDANT STREPTOCOCCUS ANGINOSIS FEW STAPHYLOCOCCUS AUREUS    Report Status 02/06/2019 FINAL  Final   Organism ID, Bacteria STREPTOCOCCUS ANGINOSIS  Final   Organism ID, Bacteria STAPHYLOCOCCUS AUREUS  Final      Susceptibility   Staphylococcus aureus - MIC*    CIPROFLOXACIN <=0.5 SENSITIVE Sensitive     ERYTHROMYCIN <=0.25 SENSITIVE Sensitive     GENTAMICIN <=0.5 SENSITIVE Sensitive     OXACILLIN 0.5 SENSITIVE Sensitive     TETRACYCLINE <=1 SENSITIVE Sensitive     VANCOMYCIN <=0.5 SENSITIVE Sensitive     TRIMETH/SULFA <=10 SENSITIVE Sensitive     CLINDAMYCIN <=0.25 SENSITIVE Sensitive     RIFAMPIN <=0.5 SENSITIVE Sensitive     Inducible Clindamycin NEGATIVE Sensitive     * FEW STAPHYLOCOCCUS AUREUS   Streptococcus anginosis - MIC*    PENICILLIN <=0.06 SENSITIVE Sensitive     ERYTHROMYCIN <=0.12 SENSITIVE Sensitive     LEVOFLOXACIN 0.5 SENSITIVE Sensitive     VANCOMYCIN 0.5 SENSITIVE Sensitive     * ABUNDANT STREPTOCOCCUS ANGINOSIS     RN Pressure Injury Documentation: Pressure Injury 02/02/19 Buttocks Left Unstageable - Full thickness tissue loss in which the base of the ulcer is covered by slough (yellow, tan, gray, green or brown) and/or eschar (tan,  brown or black) in the wound bed. (Active)  02/02/19 2213  Location: Buttocks  Location Orientation: Left  Staging: Unstageable - Full thickness tissue loss in which the base of the ulcer is covered by slough (yellow, tan, gray, green or brown) and/or eschar (tan, brown or black) in the wound bed.  Wound Description (Comments):   Present on Admission: Yes   Radiology Studies: No results found. Scheduled Meds:  aspirin EC  81 mg Oral Daily   cephALEXin  500 mg Oral Q6H   Chlorhexidine Gluconate Cloth  6 each Topical Daily   clopidogrel  75 mg Oral Daily   collagenase   Topical Daily   feeding supplement (ENSURE ENLIVE)  237 mL Oral BID BM   folic acid  1 mg Oral Daily   Gerhardt's butt cream  1 application Topical TID   guaiFENesin  1,200 mg Oral BID   latanoprost  1 drop Both Eyes QHS   megestrol  400 mg Oral TID   mirtazapine  45 mg Oral QHS   multivitamin with minerals  1 tablet Oral Daily   nutrition supplement (JUVEN)  1 packet Oral BID BM   nystatin  5 mL Oral QID   nystatin cream   Topical BID   pantoprazole  20 mg Oral Daily   phosphorus  500 mg Oral Once   Continuous Infusions:  acetaminophen Stopped (  02/06/19 1738)   metronidazole 500 mg (02/06/19 1506)    LOS: 4 days   Kerney Elbe, DO [facility] PAGER is on [place]  If 7PM-7AM, please contact night-coverage [email or web address] Password [email or web address] 02/06/2019, 6:48 PM

## 2019-02-07 ENCOUNTER — Inpatient Hospital Stay (HOSPITAL_COMMUNITY): Payer: Medicare Other

## 2019-02-07 LAB — GLUCOSE, CAPILLARY
Glucose-Capillary: 105 mg/dL — ABNORMAL HIGH (ref 70–99)
Glucose-Capillary: 123 mg/dL — ABNORMAL HIGH (ref 70–99)
Glucose-Capillary: 125 mg/dL — ABNORMAL HIGH (ref 70–99)
Glucose-Capillary: 86 mg/dL (ref 70–99)
Glucose-Capillary: 89 mg/dL (ref 70–99)
Glucose-Capillary: 90 mg/dL (ref 70–99)

## 2019-02-07 LAB — CBC WITH DIFFERENTIAL/PLATELET
Abs Immature Granulocytes: 0.28 10*3/uL — ABNORMAL HIGH (ref 0.00–0.07)
Basophils Absolute: 0 10*3/uL (ref 0.0–0.1)
Basophils Relative: 0 %
Eosinophils Absolute: 0 10*3/uL (ref 0.0–0.5)
Eosinophils Relative: 0 %
HCT: 27.2 % — ABNORMAL LOW (ref 36.0–46.0)
Hemoglobin: 8.9 g/dL — ABNORMAL LOW (ref 12.0–15.0)
Immature Granulocytes: 2 %
Lymphocytes Relative: 4 %
Lymphs Abs: 0.6 10*3/uL — ABNORMAL LOW (ref 0.7–4.0)
MCH: 31.7 pg (ref 26.0–34.0)
MCHC: 32.7 g/dL (ref 30.0–36.0)
MCV: 96.8 fL (ref 80.0–100.0)
Monocytes Absolute: 0.6 10*3/uL (ref 0.1–1.0)
Monocytes Relative: 4 %
Neutro Abs: 12.4 10*3/uL — ABNORMAL HIGH (ref 1.7–7.7)
Neutrophils Relative %: 90 %
Platelets: 187 10*3/uL (ref 150–400)
RBC: 2.81 MIL/uL — ABNORMAL LOW (ref 3.87–5.11)
RDW: 17.8 % — ABNORMAL HIGH (ref 11.5–15.5)
WBC: 13.9 10*3/uL — ABNORMAL HIGH (ref 4.0–10.5)
nRBC: 0.2 % (ref 0.0–0.2)

## 2019-02-07 LAB — COMPREHENSIVE METABOLIC PANEL
ALT: 5 U/L (ref 0–44)
AST: 23 U/L (ref 15–41)
Albumin: 2 g/dL — ABNORMAL LOW (ref 3.5–5.0)
Alkaline Phosphatase: 111 U/L (ref 38–126)
Anion gap: 9 (ref 5–15)
BUN: 21 mg/dL (ref 8–23)
CO2: 22 mmol/L (ref 22–32)
Calcium: 7.8 mg/dL — ABNORMAL LOW (ref 8.9–10.3)
Chloride: 110 mmol/L (ref 98–111)
Creatinine, Ser: 0.83 mg/dL (ref 0.44–1.00)
GFR calc Af Amer: >60 mL/min (ref >60)
GFR calc non Af Amer: >60 mL/min (ref >60)
Glucose, Bld: 100 mg/dL — ABNORMAL HIGH (ref 70–99)
Potassium: 3 mmol/L — ABNORMAL LOW (ref 3.5–5.1)
Sodium: 141 mmol/L (ref 135–145)
Total Bilirubin: 0.9 mg/dL (ref 0.3–1.2)
Total Protein: 5.6 g/dL — ABNORMAL LOW (ref 6.5–8.1)

## 2019-02-07 LAB — MAGNESIUM: Magnesium: 1.9 mg/dL (ref 1.7–2.4)

## 2019-02-07 LAB — PHOSPHORUS: Phosphorus: 2.3 mg/dL — ABNORMAL LOW (ref 2.5–4.6)

## 2019-02-07 MED ORDER — POTASSIUM PHOSPHATES 15 MMOLE/5ML IV SOLN
20.0000 mmol | Freq: Once | INTRAVENOUS | Status: AC
  Administered 2019-02-07: 09:00:00 20 mmol via INTRAVENOUS
  Filled 2019-02-07: qty 6.67

## 2019-02-07 MED ORDER — POTASSIUM CHLORIDE 10 MEQ/100ML IV SOLN
10.0000 meq | INTRAVENOUS | Status: AC
  Administered 2019-02-07 (×2): 10 meq via INTRAVENOUS
  Filled 2019-02-07 (×2): qty 100

## 2019-02-07 MED ORDER — SERTRALINE HCL 25 MG PO TABS
25.0000 mg | ORAL_TABLET | Freq: Every day | ORAL | Status: DC
  Administered 2019-02-07 – 2019-02-08 (×2): 25 mg via ORAL
  Filled 2019-02-07 (×5): qty 1

## 2019-02-07 MED ORDER — DEXTROSE 5 % IV SOLN
INTRAVENOUS | Status: DC
  Administered 2019-02-07 – 2019-02-13 (×4): via INTRAVENOUS

## 2019-02-07 MED ORDER — POTASSIUM CHLORIDE 10 MEQ/100ML IV SOLN
10.0000 meq | INTRAVENOUS | Status: AC
  Administered 2019-02-07 (×2): 10 meq via INTRAVENOUS
  Filled 2019-02-07 (×2): qty 100

## 2019-02-07 MED ORDER — KETOROLAC TROMETHAMINE 15 MG/ML IJ SOLN
15.0000 mg | Freq: Three times a day (TID) | INTRAMUSCULAR | Status: AC | PRN
  Administered 2019-02-07 – 2019-02-08 (×3): 15 mg via INTRAVENOUS
  Filled 2019-02-07 (×3): qty 1

## 2019-02-07 MED ORDER — FUROSEMIDE 10 MG/ML IJ SOLN
40.0000 mg | Freq: Once | INTRAMUSCULAR | Status: AC
  Administered 2019-02-07: 12:00:00 40 mg via INTRAVENOUS
  Filled 2019-02-07: qty 4

## 2019-02-07 NOTE — Consult Note (Addendum)
Neurology Consultation  Reason for Consult: Failure to thrive Referring Physician: Alfredia Ferguson   History is obtained from: Chart  HPI: Sydney [name] is a 85 y.o. female with past medical history weakness, TIA, syncope, palpitations, meningioma, leukopenia, hypertension, hypercholesterolemia, glaucoma, dyslipidemia, C. difficile and at this point failure to thrive.  Daughter is her 61.  Per chart patient was recently hospitalized from 12/19/2018 and discharged 10/6.  Patient was readmitted for progressive weakness, malaise, fatigue and significant failure to thrive.  She was also noted developed encephalopathy and worsening liver enzymes.  She has had an extensive work-up in the past and seen by numerous specialties including ID, GI, oncology and neurology.  During last hospitalization she was discharged home with home health given daughter was resistant to send patient to SNF and/or hospice care.  Per chart daughter had reported that she had no acute changes and continued to have poor appetite.  Home health nurse had visited house and was concerned about wheezing on respiratory exam along with sacral wound which then prompted her to come back to the ED.  In previous hospitalization patient had had chronic diarrhea from her C. difficile with stool transplant however daughter had noticed increased frequency of her bowel movements while at home.  It was noted that the daughter did not want patient to receive any narcotics/opiates especially morphine and/or tramadol due to sedation.  She also would not want her mom to have Ativan.  While in hospital general surgery was consulted for sacral wound and abscess, along with ID.  Patient was started on antibiotics for her wound abscess.  Pulmonary was consulted secondary to patient having respiratory distress due to intermittent mucous plugging due to failure to thrive and inability to cough up her secretions.  Flutter valve was recommended however patient's  daughter refused and also refused repositionin ethics was consulted for further evaluation during hospital stay.  It is noted and also easily visible that patient is frail debilitated and has severe failure to thrive along with significant poor p.o. intake.  Patient does not yes that she is visibly in pain despite daughter is refusing medications for the patient's pain.  A discussion about transition to patient transitioning to comfort measures was made to daughter however daughter declined.  Palliative care also discussed recommendations however daughter wants to continue full scope of treatment.  Patient's daughter did not want to discuss hospice and also any involvement with palliative care.  General surgery had long discussion with the daughter and Dr. Johney Maine felt the wound would never heal given her nutritional status and failure to thrive despite nursing interventions and multidisciplinary evaluations.  Patient is daughter expressed that she felt that her mother was not acting "normal "and was very anxious about the "acute change in mental status ".  Due to this patient's daughter persistently stated that she would want a MRI.  MRI was ordered however patient refused.  Currently patient is in her bed, tachypneic, satting at 91% O2.  Shakes her head stating she is uncomfortable and short of breath.  She is awake enough to follow minimal commands.  She is nonverbal at this time.  Chart review: Neurology was consulted on 01/28/2019 for gait problems.  At that time patient was able to state that she had gait weakness to the point and progressed that she was on the ground and could not get up.  She stated that the weakness progressed over a month.  At that point in time CRP was 2.8, CK was within  normal limits, potassium was 3.2 albumin 1.8, AST 44, alk phos 168, ammonia within normal limits.  Myopathy was felt to be less likely and request for imaging of C-spine and T-spine were made.  B12 a month ago was  noted to be 435 and ESR, CRP returned elevated.  MRI showed cervical and thoracic spine with general volume loss of the thoracic spine cord volume but no discernible lesion to explain the current symptoms.  MRI of cervical spine was essentially negative with no spinal stenosis and no cord signal.  With ESR and CRP being elevated and advanced age, polymyalgia rheumatica could be considered but the focal lower extremity weakness is more significant upper extremity weakness which does not bode well and the diagnosis of polymyalgia rheumatica.  At that time it was recommended that she get an EMG and nerve conduction study as an outpatient.  It was discussed with Dr. [name] that patient possibly could get a muscle biopsy however patient's daughter has been unwilling to proceed with a muscle biopsy.  At that time patient's daughter's choice was to bring her home.  Per neurology it was thought that this was a systemic process that led to weakness.  Important labs during this hospitalization include:   Work up that has been done: As above    ROS:  Unable to obtain due to altered mental status.   Past Medical History:  Diagnosis Date  . Anemia    years ago  . Aortic atherosclerosis ([place])   . Arthritis    "left hand" (02/17/2013)  . Bilateral inguinal hernia    INCIDENTAL CT 1/20  . C. difficile diarrhea   . Cataract   . Dehydration 12/29/2018  . Diplopia    CRANIAL 6 NERVE PALSY  . Dizzy   . Dyslipidemia   . Gastritis   . Glaucoma   . High cholesterol    "on RX years ago" (02/17/2013)  . Hypertension   . Leukopenia   . Meningioma ([place]) 05/01/2015  . Osteoporosis 02/2014   T score -2.5  followed by Dr. Lysle Rubens  . Palpitations    PACs, PVCs and short runs of atrial tachycardia on Holter monitoring  . PVD (peripheral vascular disease) ([place])   . Rhinitis   . Syncopal episodes 2003  . TIA (transient ischemic attack) 1990's  . Trigger thumb of left hand   . Weakness 12/29/2018  . Winter itch       Family History  Problem Relation Age of Onset  . Hypertension Father   . Heart attack Father   . Stroke Brother   . Stroke Maternal Grandmother    Social History:   reports that she has never smoked. She has never used smokeless tobacco. She reports that she does not drink alcohol or use drugs.  Medications  Current Facility-Administered Medications:  .  acetaminophen (TYLENOL) tablet 650 mg, 650 mg, Oral, Q6H PRN, 650 mg at 02/03/19 2019 **OR** acetaminophen (TYLENOL) suppository 650 mg, 650 mg, Rectal, Q6H PRN, Tu, Ching T, DO, 650 mg at 02/06/19 1015 .  aspirin EC tablet 81 mg, 81 mg, Oral, Daily, Tu, Ching T, DO, 81 mg at 02/05/19 1417 .  cephALEXin (KEFLEX) capsule 500 mg, 500 mg, Oral, Q6H, Comer, [name], MD .  Chlorhexidine Gluconate Cloth 2 % PADS 6 each, 6 each, Topical, Daily, Raiford Noble [place], 6 each at 02/06/19 1023 .  clopidogrel (PLAVIX) tablet 75 mg, 75 mg, Oral, Daily, Tu, Ching T, DO, 75 mg at 02/05/19 1417 .  collagenase (SANTYL) ointment, , Topical, Daily, Saverio Danker, PA-C .  dextrose 5 % solution, , Intravenous, Continuous, Schorr, Rhetta Mura, NP, Last Rate: 50 mL/hr at 02/07/19 0159 .  feeding supplement (ENSURE ENLIVE) (ENSURE ENLIVE) liquid 237 mL, 237 mL, Oral, BID BM, Sheikh, Omair Latif, DO, 237 mL at 02/03/19 1810 .  folic acid (FOLVITE) tablet 1 mg, 1 mg, Oral, Daily, Tu, Ching T, DO, 1 mg at 02/05/19 1417 .  Gerhardt's butt cream 1 application, 1 application, Topical, TID, Tu, Ching T, DO, 1 application at 94/70/96 1023 .  guaiFENesin (MUCINEX) 12 hr tablet 1,200 mg, 1,200 mg, Oral, BID, Raiford Noble Latif, DO, 1,200 mg at 02/05/19 1415 .  ipratropium (ATROVENT) nebulizer solution 0.5 mg, 0.5 mg, Nebulization, Q8H PRN, Sheikh, Omair Latif, DO .  ketorolac (TORADOL) 15 MG/ML injection 15 mg, 15 mg, Intravenous, Q8H PRN, Sheikh, Omair Latif, DO .  latanoprost (XALATAN) 0.005 % ophthalmic solution 1 drop, 1 drop, Both Eyes, QHS, Tu, Ching T,  DO .  levalbuterol (XOPENEX) nebulizer solution 0.63 mg, 0.63 mg, Nebulization, Q8H PRN, Sheikh, Omair Latif, DO .  loperamide (IMODIUM) capsule 2 mg, 2 mg, Oral, PRN, Tu, Ching T, DO .  megestrol (MEGACE) 400 MG/10ML suspension 400 mg, 400 mg, Oral, TID, Tu, Ching T, DO .  metroNIDAZOLE (FLAGYL) IVPB 500 mg, 500 mg, Intravenous, Q8H, Sheikh, Omair Runnells, DO, Last Rate: 100 mL/hr at 02/07/19 0641, 500 mg at 02/07/19 0641 .  mirtazapine (REMERON SOL-TAB) disintegrating tablet 45 mg, 45 mg, Oral, QHS, Tu, Ching T, DO .  multivitamin with minerals tablet 1 tablet, 1 tablet, Oral, Daily, Sheikh, Omair Latif, DO .  nutrition supplement (JUVEN) (JUVEN) powder packet 1 packet, 1 packet, Oral, BID BM, Sheikh, Omair Latif, DO .  nystatin (MYCOSTATIN) 100000 UNIT/ML suspension 500,000 Units, 5 mL, Oral, QID, Tu, Ching T, DO, 500,000 Units at 02/05/19 1429 .  nystatin cream (MYCOSTATIN), , Topical, BID, Maczis, Barth Kirks, PA-C .  pantoprazole (PROTONIX) EC tablet 20 mg, 20 mg, Oral, Daily, Tu, Ching T, DO .  phosphorus (K PHOS NEUTRAL) tablet 500 mg, 500 mg, Oral, Once, Sheikh, Omair Latif, DO .  potassium chloride 10 mEq in 100 mL IVPB, 10 mEq, Intravenous, Q1 Hr x 4, Sheikh, Omair Latif, DO .  potassium PHOSPHATE 20 mmol in dextrose 5 % 500 mL infusion, 20 mmol, Intravenous, Once, Sheikh, Omair Latif, DO .  traMADol Veatrice Bourbon) tablet 50 mg, 50 mg, Oral, Q8H PRN, Kerney Elbe, DO   Exam: Current vital signs: BP (!) 150/90 (BP Location: Left Arm)   Pulse 98   Temp 98 F (36.7 C) (Oral)   Resp (!) 24   Ht '5\' 6"'  (1.676 m)   Wt 52.2 kg   SpO2 97%   BMI 18.56 kg/m  Vital signs in last 24 hours: Temp:  [98 F (36.7 C)-99.7 F (37.6 C)] 98 F (36.7 C) (10/13 0420) Pulse Rate:  [67-98] 98 (10/13 0420) Resp:  [20-24] 24 (10/13 0420) BP: (129-150)/(67-90) 150/90 (10/13 0420) SpO2:  [92 %-100 %] 97 % (10/13 0420)  Physical Exam  Constitutional: Cachectic Psych: Appears to be in pain Eyes:  No scleral injection HENT: No OP obstrucion Head: Normocephalic.  Cardiovascular: Tachycardic Respiratory: labored breathing GI: Soft.  No distension. There is no tenderness.  Skin: WDI  Neuro: Mental Status: Patient is awake but nonvocal.  She is able to follow commands such as raising her arms, touching her nose, counting fingers, nodding her head that she is in pain  and having difficulty breathing. Cranial Nerves: II: Visual Fields are full-able to use her fingers to tell me how many fingers are in her visual fields III,IV, VI: Able to follow my finger left and right.  Attempting to look at pupils was very difficult and unable as patient would clench her eyes V: Facial sensation is symmetric to temperature VII: Facial movement is symmetric.  VIII: hearing is intact to voice X: Unable to visualize XI: Shoulder shrug is symmetric. XII: tongue is midline without atrophy or fasciculations.  Motor: Moves bilateral upper extremities antigravity.  Would not lift leg screen but did wiggle toes.  When holding arms outstretched she has a noticeable tremor Sensory: States that sensation is equal in both legs and arms Deep Tendon Reflexes: 1+ DTRs in the upper extremities no DTRs in ankle and knee jerk Plantars: Downgoing bilaterally Cerebellar: FNF  intact bilaterally  Labs I have reviewed labs in epic and the results pertinent to this consultation are:   CBC    Component Value Date/Time   WBC 13.9 (H) 02/07/2019 0509   RBC 2.81 (L) 02/07/2019 0509   HGB 8.9 (L) 02/07/2019 0509   HGB 10.7 (L) 05/01/2015 1423   HCT 27.2 (L) 02/07/2019 0509   HCT 39.3 10/03/2017 0827   PLT 187 02/07/2019 0509   PLT 321 05/01/2015 1423   MCV 96.8 02/07/2019 0509   MCV 92 05/01/2015 1423   MCH 31.7 02/07/2019 0509   MCHC 32.7 02/07/2019 0509   RDW 17.8 (H) 02/07/2019 0509   RDW 13.0 05/01/2015 1423   LYMPHSABS 0.6 (L) 02/07/2019 0509   LYMPHSABS 1.0 05/01/2015 1423   MONOABS 0.6 02/07/2019  0509   EOSABS 0.0 02/07/2019 0509   EOSABS 0.0 05/01/2015 1423   BASOSABS 0.0 02/07/2019 0509   BASOSABS 0.0 05/01/2015 1423    CMP     Component Value Date/Time   NA 141 02/07/2019 0509   K 3.0 (L) 02/07/2019 0509   CL 110 02/07/2019 0509   CO2 22 02/07/2019 0509   GLUCOSE 100 (H) 02/07/2019 0509   BUN 21 02/07/2019 0509   CREATININE 0.83 02/07/2019 0509   CREATININE 0.67 01/03/2014 1111   CALCIUM 7.8 (L) 02/07/2019 0509   PROT 5.6 (L) 02/07/2019 0509   ALBUMIN 2.0 (L) 02/07/2019 0509   AST 23 02/07/2019 0509   ALT 5 02/07/2019 0509   ALKPHOS 111 02/07/2019 0509   BILITOT 0.9 02/07/2019 0509   GFRNONAA >60 02/07/2019 0509   GFRAA >60 02/07/2019 0509    Lipid Panel     Component Value Date/Time   CHOL 95 01/13/2019 0256   TRIG 109 01/13/2019 0256   HDL 14 (L) 01/13/2019 0256   CHOLHDL 6.8 01/13/2019 0256   VLDL 22 01/13/2019 0256   LDLCALC 59 01/13/2019 0256     Imaging I have reviewed the images obtained:  MRI examination of the brain-was ordered but patient refused  Etta Quill PA-C [facility] [phone]  M-F  (9:00 am- 5:00 PM)  02/07/2019, 8:50 AM     Assessment: This is a very frail, cachectic female who has thorough work-up as above.  Now refusing medications and further treatments.  Do not see any focal lateralizing neurologic abnormalities.  Given her heart failure there may be a possibility that she showered emboli however I think this is low on the differential.  Patient refusing MRI thus cannot evaluate.  Do not feel any further neurological diagnostics would be beneficial.  Encephalopathy, altered speech  Recommendations   NEUROHOSPITALIST  ADDENDUM Performed a face to face diagnostic evaluation.   I have reviewed the contents of history and physical exam as documented by PA/ARNP/Resident and agree with above documentation.  I have discussed and formulated the above plan as documented. Edits to the note have been made as  needed.  3-year female with prolonged hospitalization for the last 3 months for sacral wound, also C. difficile infection other medical conditions include hypertension, hyperlipidemia.  Patient had acute mental status change according to the daughter where her speech was slower and she had some "trouble getting words out" although it appeared that her speech was worse slurred and slower.  Patient is on narcotics for pain.  On assessment, patient is more awake today and follows commands and answer simple questions.  .  Able to repeat without any difficulty.  No facial droop, handgrip strength bilaterally equal.  Neurology previously consult for lower extremity weakness, there was some consideration for muscle biopsy due to elevated ESR CRP to rule out myositis (possibly confounded in the setting of sacral decubitus ulcer) however daughter refused.  CK and aldolase was normal however.  Reflexes on my exam are diminished and there is muscle wasting, and my suspicion is that she is likely deconditioned due to prolonged hospitalization. However her overall affect appear to be slow and she seemed to be depressed.  Patient is on Remeron.  Reviewed her results Y85, TSH, folic acid performed in the last 3 months have been normal limits.  Recommendations Consider switching from Remeron to alternate antidepressant such as Zoloft 74m daily.  MRI brain was ordered by hospitalist rule out strokefor mental status change.  However patient refused.  Spoke with daughter, My suspicion is is low likelihood of stroke and would defer decision to pursue daughter and patient.  If performed we will follow-up results.   Neurology will be available as needed    SKarena AddisonAroor MD [facility] [phone]  If 7pm to 7am, please call on call as listed on AMION.

## 2019-02-07 NOTE — Ethics Note (Signed)
Asked to meet with daughter by attending Dr. Alfredia Ferguson.  Reason for consult: Full medical team is said to believe that a meaningful recovery is unlikely.  Specifically, I was told that "the decubitus will never heal."  Team has suggested a palliative/comfort/hospice approach.  Daughter has refused and insists on continued attempts at curative treatment.  Patient is receiving antibiotics and hydrotherapy at this point.  There is no current treatment that the team wants to stops citing futility. Palliative care has been consulted during the last hospitalization and were not well received.  My focus going into the meeting was another attempt at collaborative decision making.  [name] was caught off guard by my appearance.  Per the daughter, no one had told her that ethics would be consulted and the reason for the consult. The daughter also called a good friend, Linton Rump, to listen in on the meeting and to speak in support.  I believe I was able to overcome that initial setback and came to the following understanding.     1. The daughter is fully committed to continued curative treatments.  She describes her mom as "a Nurse, adult."  She does not agree with the medical team's assessment of an irreversible downward trend.  She has seen progress - commenting several times that the patient had perked up considerably when she "finally got the nutrition she needed" (temporary feeding tube last hospitalization.) "Mom mom told me that she wants to keep fighting to live."  "I will do anything I can to prolong her life."    Bottom line: I doubt that the daughter will change her desire for continued attempts at curative treatment any time in the foreseeable future. 2.  The daughter has considerable distrust of [place] - particularly of [facility].  "That is why we are in [place] right now."  The story the daughter tells is that her mom was doing fine prior to a hospitalization at [facility] in August  2020.  That her mom was eating, walking an talking prior to that hospitalization.  She was just weak.  "[place] caused this skin ulcer."  "It feels like you don't want to treat her.  You just want to make her comfortable and let her pass."     Ethics Recommendation to the care team: 1. First, work with nursing staff to liberalize visitation. I believe her dementia and likely terminal condition are reasons to allow addition visitors for this patient.  I know the family would appreciate this leeway.    2. Recommend proactive oral care.  "Of course, she won't eat if her mouth is sore with thrush."  The daughter is concerned that her mom is not getting sufficient care and attention.  This would be tangible evidence for her of the team's caring. 3. Similarly, nutrition is very important to her and I know the team would like to avoid another feeding tube or discussion around the feeding tube.  Having a staff member to feed her with each meal should be a priority.   4. In the same vein, PT is also very important to the daughter.  Again, it is a visible sign of [place] wanting to treat and not neglecting the patient. 5. For the care team, it is highly unlikely the daughter will ever switch to a comfort care approach.  As such, if the care team feels strongly about not offering or discontinuing an active treatment, they should follow the [place], which is  really a careful process of communicating the reasons behind the medical decisions. Madison Hickman, MD Chair, [facility] [phone]

## 2019-02-07 NOTE — Progress Notes (Signed)
Patient has expressed to this RN today that she "wants to go to heaven and see her Mom". She has had very poor po intake when offered, including meds. Eulas Post, RN

## 2019-02-07 NOTE — Progress Notes (Addendum)
PROGRESS NOTE    Sydney [name]  JJK:[id number] DOB: 1930/07/21 DOA: 02/02/2019 PCP: [name], MD  Brief Narrative:  HPI per Dr. Orene Desanctis on 02/02/2019 Sydney [name] is a 85 y.o. 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  Daughter who is her HCPOA provides most of the history.  Patient was recently hospitalized from 12/19/2018 and discharged 2 days ago on 10/6.  She was admitted for progressive weakness, malaise, fatigue and significant failure to thrive.  She also developed encephalopathy and worsening liver enzymes.  She had an extensive work-up and was seen by numerous subspecialists including ID, GI, oncology and neurology without any significant improvement in her failure to thrive syndrome.  She was discharged home with home health given daughter was resistant to SNF and hospice care.  Since discharge, daughter reports that she has not had any acute changes.  Her appetite continues to be poor.  Daughter was helping her to get supplemental home health nursing and had a nurse come evaluate her.  The nurse was concerned about wheezing on her respiratory exam and her sacral wound prompting her to come into the ED.  Daughter was unsure whether she was hypoxic at home.  Denies any fever, cough or shortness of breath.  Denies any chest pain.  Denies any nausea, vomiting or abdominal pain.  Patient has chronic diarrhea from her history of C. difficile with stool transplant and daughter has noticed increased frequency of her bowel movements. Daughter would like her to get her sacral wound care for and her goal was for her mother to be able to ambulate on her own. She does not want her mother to receive any narcotics or opioids specifically morphine and tramadol since she felt it sedated her mother during prior admission.  Also does not want  Ativan. Daughter states that she strongly wants [place] to consult infectious disease and GI prior to starting any antibiotics given her history.  ED Course: She was afebrile and had elevated blood pressure up to 164/100.  Also tachycardic up to 120s. CBC showed leukocytosis of 13.7 and hemoglobin of 9.5 which is around her baseline.  CMP showed potassium of 3.2, mildly elevated alkaline phosphatase 158 which is around her baseline.  Chest x-ray showed evidence of mild interstitial edema and small bilateral pleural effusion with significant interval improvement in the left effusion.  Central lung infection is possible but less likely.  Mild stable cardiomegaly. CT pelvis showed small soft tissue ulcer posterior to the left of the coccyx with a 1.9 x 1 x 0.4 cm abscess slightly more inferior.  No evidence of osteomyelitis.  Diffuse subcutaneous edema.  **Interim History General surgery was consulted for further evaluation of her sacral wound and abscess.  Patient's daughter would not let me put her on any antibiotics unless I spoke with ID so ID was consulted and as a courtesy GI was consulted as well and Dr. Carlean Purl saw the patient briefly and talk with her daughter as a Manufacturing engineer.  Antibiotics were started with IV cefazolin and cultures were sent as her wound abscess was spontaneously draining.  Patient started having some respiratory distress likely due to intermittent mucous plugging because of her failure to thrive and ability to cough up her secretions as well as mild volume overload so Pulmonary was consulted for further evaluation she is transferred to telemetry for continuous pulse  monitoring.    Flutter valve is recommended however patient daughter refused and repositioning refused.  Patient's daughter is only requesting the patient be given Tylenol.  Ethics was consulted for further evaluation as guidance was sought and [employer] spoke to me at length and recommended discharging the patient  home as soon as possible pending culture results and having a Education officer, museum come out to evaluate no living situation.  Patient is incredibly frail debilitated and has severe failure to thrive and has had significant poor p.o. intake.  She is visibly in pain despite this but daughter is refusing medications for the patient.  An appropriate approach would be to transition the patient to comfort measures however patient daughter declined.  I reconsulted palliative care for further evaluation and recommendations and Dr. Domingo Cocking met with the patient and the daughter and they are to continue full scope of treatment currently.  Patient's daughter did not want to discuss hospice support for mother and did not want any further involvement from a palliative care team either but Dr. Domingo Cocking provided contact information and provided a note regarding the outpatient palliative services to the hospital at [facility].  Patient's daughter continues to have unrealistic goals and has not come to terms with the patient's mortality and feels that the patient can get better and walk and have her wound care healed and get back to her normal activities of daily living despite her continued failure to thrive and high risk for aspiration.    General surgery evaluated and had a lengthy discussion with the patient's daughter that her sacral wound and Dr. gross felt that the wound will never heal given her nutritional status and failure to thrive despite nursing interventions multidisciplinary evaluations.  The daughter continues not to want to hear bad news and refuses to believe that her mother is this sick and wanted to focus on her mother's "restoration".  I brought up the fact that the patient had a history of heart failure and the patient's daughter refused to believe this even though this is documented back in her echocardiogram in 2016.  Patient's daughter refused to believe that she had a diagnosis of heart failure and felt that there  is nothing wrong with the patient's heart even though I described that she could get volume overloaded given her poor ventricular relaxation due to her diastolic heart failure.  She continues to believe that the patient came to bounce back from her significant weakness and get to a prior level where she was independent of her ADLs.  02/06/12 ID recommending was ceftezole and and changing to oral therapy when sensitivities are identified and because she grew out MSSA and Strep Viridans Dr. Linus Salmons recommended changing to oral Keflex and recommended 7 more days of treatment.  Flagyl was discontinued   Leukocytosis now trending down because patient continues to have acute urinary retention so Catheter was placed yesterday. Gentle IVF Hydration was resumed and now stopped this AM.  She received another hydrotherapy treatment and daughter requested that she be given some fentanyl IV so IV Toradol was given x1.  Patient was resting and became agitated and awoken by the patient's daughter as she kept probing her and trying to wake her up.  Patient's daughter became extremely concerned that the patient was not acting "normal" and became extremely anxious due to her mother's " acute change in mental status".  Patient was assessed and she had no acute changes from baseline and no focal deficits however because the patient's daughter  persistently stated that she had a change from her baseline and MRI was ordered and the case was discussed with neurology who will evaluate but Dr. Lorraine Lax feels like we should hold off on the EEG for now as she is just significantly worked up and last hospitalization.  02/07/12 Neurology evaluated and feels that the patient has no focal deficits but that she is actually depressed and recommended changing mirtazapine to sertraline which we have done.  Neurology also recommended a psychiatric evaluation on my discussion with Dr. Lorraine Lax will reconsult psychiatry.  Ethics physician Dr. Madison Hickman  met with the patient and her daughter had a lengthy discussion.  Other reasons ethics was called was felt that the patient is suffering on account of her daughter and that the daughter is prolonging the patient suffering for her own benefit.  Dr. Andria Frames recommended to potentially liberalize visitation for the patient as well as recommending proactive oral care and recommending having a staff to help feed the patient at each meal to be a priority.  He also recommended continuing PT and felt that the patient's daughter is 100% against the comfort based approach even though multiple specialists have recommended the patient should be on hospice care given her suffering and worsening.  Patient continues to refuse p.o. intake and IV fluids were restarted yesterday with D5 W at 50 mL's per hour to keep of the patient's blood sugar and a dose of IV Lasix was given this morning given her rhonchorous breath sounds.  Speech therapy continues to make efforts to do a speech evaluation however they are not been successful in the last night the patient refused her MRI if MRI is pursued and done neurology will follow the results.   Assessment & Plan:   Principal Problem:   Failure to thrive in adult Active Problems:   Protein calorie malnutrition (HCC)   Ataxia   MDD (major depressive disorder)   Sacral ulcer (HCC)   SOB (shortness of breath)   Wheezing   TIA (transient ischemic attack)   PVD (peripheral vascular disease) (HCC)  Failure to thrive/deconditioning/dehydration with Hypoglycemia from poor po intake, worsening -Patient had extensive work-up without any findings with numerous specialist at last admission. Reportedly felt that patient has met maximal benefit during last hospital stay and was discharged and Ethics involved.  -Daughter declined NG or PEG tube (not a candidate after discussion with GI) and refused hospice at discharge. Ethics committee was noted to be involved. See last d/c note for full  details.  -Pt was tachycardiac but likely secondary to dehydration. No fever but Leukocytosis is worsening and could be in the setting of IV steroid demargination possible aspiration -Consult dietician - SLP for swallow study- pt high risk for aspiration will allow for dysphagia 3 diet and have the daughter only feed the patient; speech therapy evaluated and started the patient was very fatigued and anxious and with her mouth and lips being severely dry intake was expected to be poor and insufficient fo any type of progress.  Speech therapy will continue efforts to attempt swallow evaluation in next 1 to 2 days and will continue as I have the patient's daughter feed the patient with a dysphagia 3 diet with basic precautions and risk of aspirations were discussed given that the patient has been very poor cough reflex and cannot really mobilize her secretions; SLP evaluation is still pending -Wound care for her sacral wound -PT/OT recommending Home Health as daughter refusing SNF -Continue Megace -IV fluid hydration  was started with D5 yesterday at 54 mils per hour and will resume and continue for the day and however patient did receive a dose of IV Lasix given her rhonchorous breath sounds and concern for volume overload; we will repeat a chest x-ray -Patient continues to have poor p.o. intake and I would avoid a PEG tube in this patient given her failure to thrive as it would likely not make much of a difference in her long-term outcome -We can broach the subject of a Cortrak Again temporarily but we will evaluate how the patient does orally after a speech evaluation -Continue with oral care per protocol  Acute Respiratory Failure with Hypoxia and Cough in setting of known diastolic dysfunction along with poor mobilization of secretions from failure to thrive and generalized weakness -CXR was read as"Enlarged cardiac silhouette. Calcific atherosclerotic disease of the aorta. Bilateral moderate in size  pleural effusions. Worsening aeration of the lungs with focal airspace opacity in the right upper lobe. Mild pulmonary edema. Osseous structures are without acute abnormality. Soft tissues are grossly normal." by the Radiologist -Lasix was stopped and fluids were started and now stopped this morning -Pulmonary feels Pleural effusions are small and that she has vascular congestion that is probably contributing to her intermittent Hypoxemia but Dr. Lamonte Sakai believes that the cause of this is likely related to some degree of diastolic dysfunction but more so malnutrition and poor oncotic pressures as well as possibly noncardiac inflammatory edema due to ongoing infection or intermittent aspiration.  He suspects that the biggest contributor to her recent acute change in oxygen need relation to her impaired airway protection, mucus and secretion management given her malnourishment nonhealing wound status and progressive weakness; I have personally reviewed her chest x-ray and I am in agreement with Dr. Lamonte Sakai -Flutter Valve, Incentive Spirometry, and Guaifenesin -Temporizing measures with suctioning and pulmonary hygiene -Continue with speech therapy and aspiration precautions and will allow for dysphagia 3 diet with the daughter knowing that she can likely aspirate -Pulmonary feels no definitive role for CT imaging and do not feel that that to make any remarkable change and improvement on his nutritional status is improved, strength building and physical therapy along with adequate management of her underlying infection and Dr. Corky Sox expects progressive weakening, worsening airway protection and aspiration -Pulmonary has recommended a comfort based approach and the daughter has adamantly refused -Patient will be started on IV fluid hydration with 50 mL's of D5W given her hypoglycemia and she was also given a dose of IV Lasix this morning given her suspected volume overload and rhonchorous sounds and  crackles -Repeat echocardiogram today showed an EF of 50 to 55% with concentric left ventricle hypertrophy and spectral Doppler parameters show left ventricular diastolic upper parameters are indeterminate pattern of left ventricular diastolic filling -If necessary we will discharge the patient with oxygen at home; she was on 4 L of oxygen via nasal cannula yesterday and today was on 2 L -Case was discussed with [place] who feels that the most appropriate course of action would be to find out what culture she is going to treat her with p.o. antibiotics and discharge her home with oxygen if necessary knowing that she will likely come back to the hospital given the daughter's opposition to hospice and comfort based measures; Ethics physician Dr. Madison Hickman evaluated today and I appreciate his evaluation please see his note for further documentation -Patient should really transition to comfort care measures at this time however patient daughter  is adamant and opposed and will stabilize the patient and after he can be discharged home once cultures are resulted -Repeat chest x-ray today showed "Persistent consolidation/atelectasis at the left lung base. Clearing of the left perihilar infiltrate. Slightly more extensive but less dense infiltrate in the right upper lobe.Small right pleural effusion, slightly increased." -I really feel that the patient is continually aspirating and will continue to aspirate even if we place a PEG tube and I would avoid a PEG tube in this patient -Continue with supplemental oxygen via nasal cannula and wean O2 as tolerated -Started the patient on IV Flagyl for aspiration based on ID recommendations and he was continued for 3 days and stopped yesterday -Continue to monitor respiratory status carefully -Strict I's and O's and daily weights and patient is -355 mL since admission  Metabolic Acidosis, improved and stable -In the setting of dehydration and poor p.o.  intake Patient CO2 of 13, anion gap was 14 and chloride levels 113 -Now CO2 is 22 , anion gap is 9 and chloride level 110 -IV fluids now discontinued -Continue monitor and trend repeat CMP in a.m.  Acute Urinary Retention -I and O Cath today however she may require a Foley catheter as she is required several and will cath -Status post IV Lasix but will hold further diuresis -Renal U/S done and showed mild hydronephrosis and I spoke with urology Dr. Lovena Neighbours who recommends continuing current plan of care and no further intervention with stenting necessary -Continue to Monitor Strict I's and O's -Place Foley catheter given continue urinary retention -Remove Foley next few days and do a trial of void  Sacral Decubitus Ulcer Stage4 with a Tract and Now Abscess -Continue local wound care-appreciate wound care RN input and Surgical Evaluation -She continues to have discomfort at the sacral site and Daughter refuses any medication but Tylenol -Cx's sent and they have been reintubated for further growth -Started Empiric Abx with Cefazolin per ID Recc's -Initial Gram Stain showed "RARE WBC PRESENT,BOTH PMN AND MONONUCLEAR  ABUNDANT GRAM POSITIVE COCCI IN PAIRS  ABUNDANT GRAM VARIABLE RODS" with Cx's showing staph aureus mainly Strep Viridans and antibiotics were changed to p.o. Keflex by Dr. Linus Salmons -Hydrotherapy recommended by General Surgery and this is to be done today -Dr. Johney Maine had a extremely long discussion with the patient's daughter about the wound and her prognosis and daughter did not want to hear any bad news and only wanted to focus on the positive" speak life" over the patient -Patient continues to get hydrotherapy and p.o. antibiotics were initiated if patient is able to take p.o. intake by mouth however today she was refusing to eat and only took a few small sips  Deconditioning/Debility/Critical illness myopathy -Secondary to prolonged hospitalization/acute illness-and underlying  failure to thrive syndrome.   -PT evaluated the patient recommending Home Health as patient's daughter will not take her to SNF -Prognosis is extremely poor patient is going to become weaker and more debilitated with her poor p.o. intake and she has a very high risk of inpatient decompensation and worsening -Ethics consulted for further evaluation and ethics note as noted and appreciate Dr. Lowella Bandy evaluation -Neurology evaluated as well and had previously seen her for her lower extremity weakness and there was consideration of a muscle biopsy due to elevated ESR and CRP to rule out myositis however daughter refused last time.  CK and aldolase was normal however -She continues to have muscle wasting and neurology still continues to feel that she is deconditioned due  to prolonged hospitalization  Severe Malnutrition in the Context of Chronic Illness -Nutritionist consulted for further evaluation and recommendations -Nutritionist recommended ordering Ensure Enlive BID, Magic Cup BID, and Juven BID and recommending Floor staff to assist with feeding as needed if daughter is not present   Hypokalemia  -K+ was 3.0 -Replete with IV KCl again today -Checked Mag Level and was 1.9 -Continue to Monitor and Replete as Necessary -Repeat CMP in AM   Hypo-phosphatemia -Mild at 2.3 -Replete with p.o. K-Phos Neutral 500 mg  -Continue monitor and replete as necessary -Repeat phosphorus level in a.m.  Oral Thrush -Nystatin suspension continue -May need to treat with IV fluconazole   Normocytic Anemia -Hemoglobin of 9.5 on admission and now Hgb/Hct essentially stable at 8.9/27.2 -Continue to monitor CBC  HTN -stable without antihypertensive  History of C. difficile colitis s/p stool transplantation -Continues to have loose stool and daughter endorse increased frequency in the past few days.  However, she does not have a fever or abdominal pain to suggest recurrent C. difficile  colitis -Infectious disease recommendation at last admission was to continue Imodium as needed -GI has no hesitation using Abx per Dr. Celesta Aver note and she is now on ceftriaxone and IV Flagyl which has now been stopped and changed to p.o. Keflex; Flagyl now discontinued  History of TIA -Continue aspirin and Plavix   Leukocytosis, now improving -Worsened and likely in the setting of steroid demargination but also could be from aspiration and possible aspiration pneumonia as well and her sacral wound  -patient was on IV cefazolin which was changed to p.o. Keflex and we added IV Flagyl based on ID recommendation but this is now been stopped at [facility] discretion -Continue monitor and trend WBC and has now trended down to 13.9 -Repeat CBC in a.m. and continue to monitor for signs and symptoms of infection  Renal Insufficiency/AKI improved -Mild and likely worsened in setting of IV Lasix -Patient will be started on D5 W at 50 mL's per hour we will give a dose of IV Lasix will continue D5W given that she continues to be hypoglycemic -Patient's BUN/creatinine is now 21/0.3 Can avoid nephrotoxic medications, contrast dyes, hypotension if possible -Continue monitor and trend renal function -Repeat CMP in a.m.  Lethargy and daughter's concern for change in mental status -No focal deficits were identified and patient was awake and alert and responsive and talking to me however daughter is insistent that she had a acute change and now actually complaining of pain -Nursing reports no acute changes on their shift and patient has been essentially the same throughout the whole hospitalization -Attempted to obtain an MRI however the patient refused last night and neurology evaluated and felt that she had no acute focal deficits; she is able to follow-up commands and answer simple questions and able to repeat sentences without issue; when I examined the patient today she waved me over and stated that she  wanted to "see her mother in heaven." -Patient's MRIs been held currently but if necessary will continue to attempt and neurology will evaluate the MRI at that time and Dr. Arrie Eastern suspicion is that there is a low likelihood of stroke and would defer pursuing the MRI to the patient's daughter and the patient  Depression? -Patient has been taking Remeron 45 mg p.o. nightly and will stop and change to sertraline 25 mg p.o. daily at the recommendations of neurology -We will also obtain a psychiatric evaluation  DVT prophylaxis: SCDs Code Status: FULL CODE Family  Communication: No family at bedside during examination but spoke with daughter via Telephone Disposition Plan: Anticipate D/C Home in the next 24-48 hours pending Cx results and improvement   Consultants:   General Surgery Dr. Johney Maine  Infectious Diseases  Pulmonary  Discussed with Gastroenterology Dr. Carlean Purl  Discussed with Palliative Care Dr. Hilma Favors; formal consultation with Dr. Harrison Mons -Spoke with Idolina Primer and Dr. Madison Hickman saw the patient today in formal consultation  Neurology  Psychiatry  Procedures: Hydrotherapy   Antimicrobials:  Anti-infectives (From admission, onward)   Start     Dose/Rate Route Frequency Ordered Stop   02/06/19 1800  cephALEXin (KEFLEX) capsule 500 mg     500 mg Oral Every 6 hours 02/06/19 1500     02/04/19 1400  metroNIDAZOLE (FLAGYL) IVPB 500 mg  Status:  Discontinued     500 mg 100 mL/hr over 60 Minutes Intravenous Every 8 hours 02/04/19 1343 02/07/19 1114   02/03/19 1500  ceFAZolin (ANCEF) IVPB 1 g/50 mL premix  Status:  Discontinued     1 g 100 mL/hr over 30 Minutes Intravenous Every 8 hours 02/03/19 1444 02/06/19 1500     Subjective: Patient seen and examined at bedside and she was not talking to me much this morning and did answer questions yes and no and right before I left she waved me over and spoke to me and stated "I want to see my mother in heaven".  Neurology  evaluated the patient and he does not feel that she has any acute focal deficits either and recommended changing Remeron to sertraline.  Ethics was consulted and they met with the family specifically the daughter and the daughter's friend over the telephone.  Patient continues to have extremely poor p.o. intake and only had a few sips of her drink and refused to eat.  MRI was attempted last night and unfortunately not able to be done given patient's cooperation and combativeness.  Speech therapy also attempted to evaluate the patient again however could not be done as she received a hydrotherapy session today.  Patient's daughter requested the patient be given an IV Toradol given the patient's pain.  Daughter was updated over the telephone and there other concerns or complaints at this time.  Objective: Vitals:   02/07/19 0420 02/07/19 0900 02/07/19 1609 02/07/19 1752  BP: (!) 150/90  124/77   Pulse: 98  92   Resp: (!) 24     Temp: 98 F (36.7 C) 98.6 F (37 C) 98.4 F (36.9 C)   TempSrc: Oral Axillary Oral   SpO2: 97%  98% 94%  Weight:      Height:        Intake/Output Summary (Last 24 hours) at 02/07/2019 1921 Last data filed at 02/07/2019 5409 Gross per 24 hour  Intake 310 ml  Output 150 ml  Net 160 ml   Filed Weights   02/02/19 1345  Weight: 52.2 kg   Examination: Physical Exam:  Constitutional: Extremely thin and cachectic frail elderly AAF in NAD and appears calm but a little uncomfortable Eyes: Lids and conjunctivae normal, sclerae anicteric  ENMT: External Ears, Nose appear normal. Grossly normal hearing. Mucous membranes are dry somewhat Neck: Appears normal, supple, no cervical masses, normal ROM, no appreciable thyromegaly; no JVD Respiratory: Diminishedto auscultation bilaterally with coarse breath sounds and rhonchi worse on the left compared to the right;,. Normal respiratory effort and patient is not tachypenic. No accessory muscle use. Wearing 2 liters of  supplemental O2 via [place]  Cardiovascular: RRR, no murmurs / rubs / gallops. S1 and S2 auscultated. 1+ LE  pitting edema. Abdomen: Soft, non-tender, non-distended.  Bowel sounds positive and slightly hyperactive.  GU: Deferred. Musculoskeletal: No clubbing / cyanosis of digits/nails. Has muscle wasting and no joint deformities on the upper and lower extremities.  Skin: Has a Sacral Decubitus ulcer. No induration; Warm and dry.  Neurologic: CN 2-12 grossly intact with no focal deficits.  Romberg sign and cerebellar reflexes not assessed.  Psychiatric: Impaired judgment and insight. Alert and awake. Calm mood and appropriate affect.    Data Reviewed: I have personally reviewed following labs and imaging studies  CBC: Recent Labs  Lab 02/02/19 1538 02/03/19 0257 02/04/19 0540 02/05/19 0525 02/06/19 0459 02/07/19 0509  WBC 13.7* 17.8* 23.8* 22.5* 15.1* 13.9*  NEUTROABS 12.2*  --  21.9* 19.9* 13.8* 12.4*  HGB 9.5* 11.7* 9.7* 8.9* 8.9* 8.9*  HCT 30.3* 35.8* 29.7* 27.2* 27.5* 27.2*  MCV 99.3 96.2 95.5 95.8 98.6 96.8  PLT 245 193 233 212 204 627   Basic Metabolic Panel: Recent Labs  Lab 02/03/19 0257 02/04/19 0540 02/05/19 0525 02/06/19 0459 02/07/19 0509  NA 140 142 142 142 141  K 3.4* 3.4* 3.3* 3.1* 3.0*  CL 113* 110 110 109 110  CO2 13* 19* 19* 23 22  GLUCOSE 67* 146* 95 100* 100*  BUN _0 CREATININE 0.83 0.95 1.08* 0.80 0.83  CALCIUM 8.1* 8.1* 8.0* 7.9* 7.8*  MG 1.9 1.9 1.8 2.0 1.9  PHOS 3.4 3.0 2.8 2.4* 2.3*   GFR: Estimated Creatinine Clearance: 40.1 mL/min (by C-G formula based on SCr of 0.83 mg/dL). Liver Function Tests: Recent Labs  Lab 02/03/19 0257 02/04/19 0540 02/05/19 0525 02/06/19 0459 02/07/19 0509  AST 39 35 35 30 23  ALT _1 ALKPHOS 144* 132* 122 112 111  BILITOT 0.9 1.0 1.1 1.0 0.9  PROT 6.3* 6.5 6.3* 5.6* 5.6*  ALBUMIN 2.3* 2.4* 2.3* 2.0* 2.0*   No results for input(s): LIPASE, AMYLASE in the last 168 hours. No results for  input(s): AMMONIA in the last 168 hours. Coagulation Profile: No results for input(s): INR, PROTIME in the last 168 hours. Cardiac Enzymes: No results for input(s): CKTOTAL, CKMB, CKMBINDEX, TROPONINI in the last 168 hours. BNP (last 3 results) No results for input(s): PROBNP in the last 8760 hours. HbA1C: No results for input(s): HGBA1C in the last 72 hours. CBG: Recent Labs  Lab 02/07/19 0049 02/07/19 0417 02/07/19 0741 02/07/19 1157 02/07/19 1700  GLUCAP 90 89 86 105* 123*   Lipid Profile: No results for input(s): CHOL, HDL, LDLCALC, TRIG, CHOLHDL, LDLDIRECT in the last 72 hours. Thyroid Function Tests: No results for input(s): TSH, T4TOTAL, FREET4, T3FREE, THYROIDAB in the last 72 hours. Anemia Panel: No results for input(s): VITAMINB12, FOLATE, FERRITIN, TIBC, IRON, RETICCTPCT in the last 72 hours. Sepsis Labs: Recent Labs  Lab 02/02/19 2005  LATICACIDVEN 1.5    Recent Results (from the past 240 hour(s))  SARS CORONAVIRUS 2 (TAT 6-24 HRS) Nasopharyngeal Nasopharyngeal Swab     Status: None   Collection Time: 02/02/19  6:43 PM   Specimen: Nasopharyngeal Swab  Result Value Ref Range Status   SARS Coronavirus 2 NEGATIVE NEGATIVE Final    Comment: (NOTE) SARS-CoV-2 target nucleic acids are NOT DETECTED. The SARS-CoV-2 RNA is generally detectable in upper and lower respiratory specimens during the acute phase of infection. Negative results do not preclude SARS-CoV-2 infection, do not rule out co-infections  with other pathogens, and should not be used as the sole basis for treatment or other patient management decisions. Negative results must be combined with clinical observations, patient history, and epidemiological information. The expected result is Negative. Fact Sheet for Patients: SugarRoll.be Fact Sheet for Healthcare Providers: https://www.woods-mathews.com/ This test is not yet approved or cleared by the [place] FDA and  has been authorized for detection and/or diagnosis of SARS-CoV-2 by FDA under an Emergency Use Authorization (EUA). This EUA will remain  in effect (meaning this test can be used) for the duration of the COVID-19 declaration under Section 56 4(b)(1) of the Act, 21 U.S.C. section 360bbb-3(b)(1), unless the authorization is terminated or revoked sooner. Performed at [facility], [place] [address] [place], [place] [address]   Aerobic Culture (superficial specimen)     Status: None   Collection Time: 02/03/19 11:46 AM   Specimen: Sacral; Wound  Result Value Ref Range Status   Specimen Description   Final    SACRAL Performed at [place] [address], [place], [place] [address]    Special Requests   Final    NONE Performed at [facility], [place] [address], [place], [place] [address]    Gram Stain   Final    RARE WBC PRESENT,BOTH PMN AND MONONUCLEAR ABUNDANT GRAM POSITIVE COCCI IN PAIRS ABUNDANT GRAM VARIABLE ROD Performed at [facility], [place] [address] [place], [place] [address]    Culture   Final    ABUNDANT STREPTOCOCCUS ANGINOSIS FEW STAPHYLOCOCCUS AUREUS    Report Status 02/06/2019 FINAL  Final   Organism ID, Bacteria STREPTOCOCCUS ANGINOSIS  Final   Organism ID, Bacteria STAPHYLOCOCCUS AUREUS  Final      Susceptibility   Staphylococcus aureus - MIC*    CIPROFLOXACIN <=0.5 SENSITIVE Sensitive     ERYTHROMYCIN <=0.25 SENSITIVE Sensitive     GENTAMICIN <=0.5 SENSITIVE Sensitive     OXACILLIN 0.5 SENSITIVE Sensitive     TETRACYCLINE <=1 SENSITIVE Sensitive     VANCOMYCIN <=0.5 SENSITIVE Sensitive     TRIMETH/SULFA <=10 SENSITIVE Sensitive     CLINDAMYCIN <=0.25 SENSITIVE Sensitive     RIFAMPIN <=0.5 SENSITIVE Sensitive     Inducible Clindamycin NEGATIVE Sensitive     * FEW STAPHYLOCOCCUS AUREUS   Streptococcus anginosis - MIC*    PENICILLIN <=0.06 SENSITIVE Sensitive     ERYTHROMYCIN <=0.12  SENSITIVE Sensitive     LEVOFLOXACIN 0.5 SENSITIVE Sensitive     VANCOMYCIN 0.5 SENSITIVE Sensitive     * ABUNDANT STREPTOCOCCUS ANGINOSIS     RN Pressure Injury Documentation: Pressure Injury 02/02/19 Buttocks Left Unstageable - Full thickness tissue loss in which the base of the ulcer is covered by slough (yellow, tan, gray, green or brown) and/or eschar (tan, brown or black) in the wound bed. (Active)  02/02/19 2213  Location: Buttocks  Location Orientation: Left  Staging: Unstageable - Full thickness tissue loss in which the base of the ulcer is covered by slough (yellow, tan, gray, green or brown) and/or eschar (tan, brown or black) in the wound bed.  Wound Description (Comments):   Present on Admission: Yes   Radiology Studies: Dg Chest Port 1 View  Result Date: 02/07/2019 CLINICAL DATA:  Shortness of breath. Rhonchi. EXAM: PORTABLE CHEST 1 VIEW COMPARISON:  Chest x-ray 02/04/2019 and CT scan of the abdomen dated 01/06/2019 FINDINGS: Stable borderline cardiomegaly. Persistent consolidation/atelectasis/effusion at the left lung base. Clearing of the faint left perihilar infiltrate. Small right  pleural effusion is slightly more prominent. The hazy infiltrate in the right upper lobe is slightly more extensive but less dense and the right perihilar infiltrate has almost resolved. Aortic atherosclerosis.  No acute bone abnormality. IMPRESSION: 1. Persistent consolidation/atelectasis at the left lung base. 2. Clearing of the left perihilar infiltrate. 3. Slightly more extensive but less dense infiltrate in the right upper lobe. 4. Small right pleural effusion, slightly increased. 5.  Aortic Atherosclerosis (ICD10-I70.0). Electronically Signed   By: Lorriane Shire M.D.   On: 02/07/2019 15:31   Scheduled Meds:  aspirin EC  81 mg Oral Daily   cephALEXin  500 mg Oral Q6H   Chlorhexidine Gluconate Cloth  6 each Topical Daily   clopidogrel  75 mg Oral Daily   collagenase   Topical Daily    feeding supplement (ENSURE ENLIVE)  237 mL Oral BID BM   folic acid  1 mg Oral Daily   Gerhardt's butt cream  1 application Topical TID   guaiFENesin  1,200 mg Oral BID   latanoprost  1 drop Both Eyes QHS   megestrol  400 mg Oral TID   multivitamin with minerals  1 tablet Oral Daily   nutrition supplement (JUVEN)  1 packet Oral BID BM   nystatin  5 mL Oral QID   nystatin cream   Topical BID   pantoprazole  20 mg Oral Daily   phosphorus  500 mg Oral Once   sertraline  25 mg Oral Daily   Continuous Infusions:  dextrose 30 mL/hr at 02/07/19 1609    LOS: 5 days   Kerney Elbe, DO [facility] PAGER is on AMION  If 7PM-7AM, please contact night-coverage [email or web address] Password [email or web address] 02/07/2019, 7:21 PM

## 2019-02-07 NOTE — Progress Notes (Signed)
PT Hydrotherapy   02/07/19 1800  Subjective Assessment  Subjective Pt is more alert with eyes open today but still not verbalizing any answers just head nods  Patient and Family Stated Goals per dtr "We want mama to get better and be able to walk and this wound will heal"  Evaluation and Treatment  Evaluation and Treatment Procedures Explained to Patient/Family Yes  Evaluation and Treatment Procedures agreed to  Pressure Injury 02/02/19 Buttocks Left Unstageable - Full thickness tissue loss in which the base of the ulcer is covered by slough (yellow, tan, gray, green or brown) and/or eschar (tan, brown or black) in the wound bed.  Date First Assessed/Time First Assessed: 02/02/19 2213   Location: Buttocks  Location Orientation: Left  Staging: Unstageable - Full thickness tissue loss in which the base of the ulcer is covered by slough (yellow, tan, gray, green or brown) and/or e...  Dressing Type Foam - Lift dressing to assess site every shift;Moist to dry (moiust to dry with santyle on opened 4x4 for packing )  Dressing Changed  Dressing Change Frequency Twice a day  State of Healing Non-healing  Site / Wound Assessment Granulation tissue;Purple;Yellow;Painful  % Wound base Red or Granulating 15%  % Wound base Yellow/Fibrinous Exudate 40%  % Wound base Black/Eschar  (the rest is unknown in undermining areas )  Drainage Amount Minimal  Drainage Description Purulent  Treatment Debridement (Selective);Cleansed;Hydrotherapy (Pulse lavage);Packing (Saline gauze) (with santyl)  Hydrotherapy  Pulsed lavage therapy - wound location sacrum   Pulsed Lavage with Suction (psi) 8 psi  Pulsed Lavage with Suction - Normal Saline Used 1000 mL  Pulsed Lavage Tip Tip with splash shield  Selective Debridement  Selective Debridement - Location sacrum   Selective Debridement - Tools Used Forceps;Scissors  Selective Debridement - Tissue Removed stringy sloth  Wound Therapy - Assess/Plan/Recommendations   Wound Therapy - Clinical Statement pt with less brown drainage today, less to no odor, and more granulaition in areas that I can see. However still looks like the larger area may open up to the inferior 2 smaller holes.   Wound Therapy - Functional Problem List decreasaed mobility and nutrition   Factors Delaying/Impairing Wound Healing Immobility;Multiple medical problems  Hydrotherapy Plan Debridement;Dressing change;Patient/family education;Pulsatile lavage with suction  Wound Therapy - Frequency 6X / week  Wound Therapy - Current Recommendations Case manager/social work;PT;OT  Wound Therapy - Follow Up Recommendations Skilled nursing facility (dtr wants to take pt home )  Clide Dales, PT Acute Rehabilitation Services Pager: [phone] Office: [phone] 02/07/2019

## 2019-02-07 NOTE — Progress Notes (Signed)
Physical Therapy Treatment Patient Details Name: Sydney [name] MRN: [id number] DOB: 08-06-1930 Today's Date: 02/07/2019    History of Present Illness MATIANA MARTINCIC is a 85 y.o. 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    PT Comments    Pt wanting to sit EOB when I entered. Pt with eye open today which is better than last 2 days. Responding to commands and questions with head nods, but not verbal language.  Began with supine LE bed exercises with PROM/AAROM . Have to give patient intructions and comfort level in order for her not to resist in full extension/adduction pattern for knee flexion, and LE movement etc. Then supine to sit maxA , and sat EOB for 5-6 minutes with Supervision to MinA for balance, however it was painful at times on her sacral wound even though on air mattress. Return on sidelying position for comfort with dtr present during session.     Follow Up Recommendations  Supervision/Assistance - 24 hour;Home health PT(dtr wanting to take pt home not SNF at this time)     Equipment Recommendations  None recommended by PT    Recommendations for Other Services       Precautions / Restrictions Precautions Precaution Comments: sacral wound, very painful for patient Restrictions Weight Bearing Restrictions: No    Mobility  Bed Mobility Overal bed mobility: Needs Assistance Bed Mobility: Supine to Sit;Sit to Supine Rolling: Mod assist   Supine to sit: Max assist Sit to supine: Max assist   General bed mobility comments: Max A with hand over hand guidance towaqrd bed rail for lateral rolling however with time pt does assist 50% of time with upper Extremities on  hand rials once youplace them there. . Supine to sit MaxA to get there. once there , sat EOB for 5-6 minutes with minA to Supervision for sitting balance. Pt  grimacing at times due to pain in sacral area sitting. Sat on inflated mattress to minmimize pain for patient.  Transfers                    Ambulation/Gait                 Stairs             Wheelchair Mobility    Modified Rankin (Stroke Patients Only)       Balance                                            Cognition Arousal/Alertness: Lethargic(awake during session more today than yesterday's hydro session. Able to open eyes and respond to questions with head knods. Would not opne mouth to say any words today.)                                            Exercises General Exercises - Lower Extremity Ankle Circles/Pumps: AAROM;Both;10 reps;Supine(with static DF stretch and cross friction mild at achilles for stretch.) Heel Slides: AAROM;Both;10 reps;Supine    General Comments        Pertinent Vitals/Pain Pain Assessment: Faces Faces Pain Scale: Hurts whole lot Pain Location: buttocks with any attempts to mobilize  Pain Descriptors / Indicators: Grimacing;Moaning;Tender Pain Intervention(s): Limited activity within patient's tolerance;Repositioned    Home Living                      Prior Function            PT Goals (current goals can now be found in the care plan section) Acute Rehab PT Goals Patient Stated Goal: For her mom to feel better and get stronger PT Goal Formulation: Patient unable to participate in goal setting Time For Goal Achievement: 02/17/19 Potential to Achieve Goals: Fair Progress towards PT goals: Not progressing toward goals - comment(pt with poor nutrition and limited alert levels to participate in mobility throughout the day for healing purposes)    Frequency    Min 2X/week      PT Plan Current plan remains appropriate    Co-evaluation              AM-PAC PT "6 Clicks" Mobility   Outcome Measure  Help needed turning from your back to your side while in  a flat bed without using bedrails?: Total Help needed moving from lying on your back to sitting on the side of a flat bed without using bedrails?: Total Help needed moving to and from a bed to a chair (including a wheelchair)?: Total Help needed standing up from a chair using your arms (e.g., wheelchair or bedside chair)?: Total Help needed to walk in hospital room?: Total Help needed climbing 3-5 steps with a railing? : Total 6 Click Score: 6    End of Session   Activity Tolerance: Patient limited by pain;Patient tolerated treatment well Patient left: in bed;with call bell/phone within reach;with family/visitor present(dtr) Nurse Communication: Mobility status PT Visit Diagnosis: Muscle weakness (generalized) (M62.81);Adult, failure to thrive (R62.7);Other abnormalities of gait and mobility (R26.89)     Time: [id number] PT Time Calculation (min) (ACUTE ONLY): 38 min  Charges:  $Therapeutic Activity: 23-37 mins                     Clide Dales, PT Acute Rehabilitation Services Pager: [phone] Office: [phone] 02/07/2019    Clide Dales 02/07/2019, 6:10 PM

## 2019-02-07 NOTE — Evaluation (Signed)
SLP Cancellation Note  Patient Details Name: Sydney [name] MRN: [id number] DOB: 02/12/1931   Cancelled treatment:       Reason Eval/Treat Not Completed: Patient at procedure or test/unavailable(pt undergoing hydrotherapy at this time, will continue efforts)   [name] 02/07/2019, 2:41 PM   Luanna Salk, [facility] SLP Acute Rehab Services Pager [phone] Office [phone]

## 2019-02-08 DIAGNOSIS — F320 Major depressive disorder, single episode, mild: Secondary | ICD-10-CM

## 2019-02-08 DIAGNOSIS — R627 Adult failure to thrive: Secondary | ICD-10-CM

## 2019-02-08 LAB — GLUCOSE, CAPILLARY
Glucose-Capillary: 100 mg/dL — ABNORMAL HIGH (ref 70–99)
Glucose-Capillary: 102 mg/dL — ABNORMAL HIGH (ref 70–99)
Glucose-Capillary: 106 mg/dL — ABNORMAL HIGH (ref 70–99)
Glucose-Capillary: 110 mg/dL — ABNORMAL HIGH (ref 70–99)
Glucose-Capillary: 90 mg/dL (ref 70–99)
Glucose-Capillary: 98 mg/dL (ref 70–99)

## 2019-02-08 LAB — COMPREHENSIVE METABOLIC PANEL
ALT: 6 U/L (ref 0–44)
AST: 21 U/L (ref 15–41)
Albumin: 2.1 g/dL — ABNORMAL LOW (ref 3.5–5.0)
Alkaline Phosphatase: 123 U/L (ref 38–126)
Anion gap: 7 (ref 5–15)
BUN: 23 mg/dL (ref 8–23)
CO2: 22 mmol/L (ref 22–32)
Calcium: 7.7 mg/dL — ABNORMAL LOW (ref 8.9–10.3)
Chloride: 109 mmol/L (ref 98–111)
Creatinine, Ser: 0.99 mg/dL (ref 0.44–1.00)
GFR calc Af Amer: 60 mL/min — ABNORMAL LOW (ref >60)
GFR calc non Af Amer: 52 mL/min — ABNORMAL LOW (ref >60)
Glucose, Bld: 111 mg/dL — ABNORMAL HIGH (ref 70–99)
Potassium: 3.3 mmol/L — ABNORMAL LOW (ref 3.5–5.1)
Sodium: 138 mmol/L (ref 135–145)
Total Bilirubin: 1.1 mg/dL (ref 0.3–1.2)
Total Protein: 5.8 g/dL — ABNORMAL LOW (ref 6.5–8.1)

## 2019-02-08 LAB — CBC WITH DIFFERENTIAL/PLATELET
Abs Immature Granulocytes: 0.19 10*3/uL — ABNORMAL HIGH (ref 0.00–0.07)
Basophils Absolute: 0 10*3/uL (ref 0.0–0.1)
Basophils Relative: 0 %
Eosinophils Absolute: 0 10*3/uL (ref 0.0–0.5)
Eosinophils Relative: 0 %
HCT: 27 % — ABNORMAL LOW (ref 36.0–46.0)
Hemoglobin: 8.7 g/dL — ABNORMAL LOW (ref 12.0–15.0)
Immature Granulocytes: 2 %
Lymphocytes Relative: 4 %
Lymphs Abs: 0.4 10*3/uL — ABNORMAL LOW (ref 0.7–4.0)
MCH: 31.6 pg (ref 26.0–34.0)
MCHC: 32.2 g/dL (ref 30.0–36.0)
MCV: 98.2 fL (ref 80.0–100.0)
Monocytes Absolute: 0.6 10*3/uL (ref 0.1–1.0)
Monocytes Relative: 5 %
Neutro Abs: 10 10*3/uL — ABNORMAL HIGH (ref 1.7–7.7)
Neutrophils Relative %: 89 %
Platelets: 194 10*3/uL (ref 150–400)
RBC: 2.75 MIL/uL — ABNORMAL LOW (ref 3.87–5.11)
RDW: 18.4 % — ABNORMAL HIGH (ref 11.5–15.5)
WBC: 11.2 10*3/uL — ABNORMAL HIGH (ref 4.0–10.5)
nRBC: 0.4 % — ABNORMAL HIGH (ref 0.0–0.2)

## 2019-02-08 LAB — MAGNESIUM: Magnesium: 1.8 mg/dL (ref 1.7–2.4)

## 2019-02-08 LAB — PHOSPHORUS: Phosphorus: 2.7 mg/dL (ref 2.5–4.6)

## 2019-02-08 NOTE — Evaluation (Signed)
SLP Cancellation Note  Patient Details Name: Sydney [name] MRN: [id number] DOB: Mar 09, 1931   Cancelled treatment:       Reason Eval/Treat Not Completed: Patient at procedure or test/unavailable(pt working with OT at this time, will continue efforts; pt and daughter known to SLP group and aspiration precautions and education has previously been discussed extensively during last admission gleaned from chart review; intake remains poor)  Luanna Salk, [facility] SLP [place] Pager [phone] Office [phone]  [name] 02/08/2019, 5:44 PM

## 2019-02-08 NOTE — Progress Notes (Signed)
    [facility] for Infectious Disease   Reason for visit: Follow up on ducubitus ulcer with infection  Interval History: stopped in hallway by daughter who reports 4 loose stools recently after changing to oral antibiotic. Also concern of being on ppi with history of C diff.    Physical Exam: Constitutional:  Vitals:   02/08/19 0525 02/08/19 1331  BP: (!) 148/113 133/76  Pulse: 91 84  Resp: 20 16  Temp: 98.7 F (37.1 C) 98.4 F (36.9 C)  SpO2: 94% 100%   patient appears in NAD  Impression/Plan: Stable infected wound.  Keflex 5 days total.  Avoid ppi if able to reduce C diff risk.  Stop antibiotics if diarrhea worsens.

## 2019-02-08 NOTE — Progress Notes (Signed)
Occupational Therapy Treatment Patient Details Name: Sydney [name] MRN: [id number] DOB: [birth year] Today's Date: 02/08/2019    History of present illness Sydney [name] is a 85 y.o. 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   OT comments  Pt seen for OT tx this date to address ADL mobility and ADLs as tolerable. Pt with low, but improving tolerance for therapy participation. While pt politely declines participation in UB therex, pt is agreeable to sitting EOB. Pt requires MAX A for sup<>sit transition to manage UB/LB and tolerates static sitting primarily at Supv level for ~1.5 minutes. Pt's daughter present throughout and offers encouragement throughout bed mobility. Attempted to engage pt in taking a drink while seated EOB to address sitting ADLs, but pt cannot tolerance and requests to return to supine. Pt in bed with call bell with dtr present at end of session. D/c dispo remains appropriate at this time.    Follow Up Recommendations  Home health OT    Equipment Recommendations  None recommended by OT    Recommendations for Other Services      Precautions / Restrictions Precautions Precautions: Fall Precaution Comments: sacral wound, very painful for patient       Mobility Bed Mobility Overal bed mobility: Needs Assistance Bed Mobility: Supine to Sit;Sit to Supine Rolling: Mod assist   Supine to sit: Max assist Sit to supine: Max assist   General bed mobility comments: MAX A for hand over hand guidance toward bedrail, requires MOD/MAX verbal cues and assist to "walk" LEs toward EOB in prep for transition. Once seated EOB, tolerates 1.5 minutes before requesting to lay back down. Dtr present throughout session, attempts to encourage pt to sit longer and offers general encouragement. Supv to MIN A intermittently for static sitting  balance, some grimacing secondary to pain in sacral area.  Transfers                 General transfer comment: transfers deferred at this time d/t pain and decreased tolerance.    Balance Overall balance assessment: Needs assistance Sitting-balance support: Single extremity supported Sitting balance-Leahy Scale: Poor Sitting balance - Comments: static sitting mostly F with UEs utilzlized for support, intermittent MIN A required warranting P rating       Standing balance comment: deferred                           ADL either performed or assessed with clinical judgement   ADL                                               Vision Baseline Vision/History: Wears glasses Wears Glasses: At all times Patient Visual Report: No change from baseline     Perception     Praxis      Cognition Arousal/Alertness: Lethargic Behavior During Therapy: WFL for tasks assessed/performed Overall Cognitive Status: Impaired/Different from baseline(pt with some improved eye opening and visual attention throughout session, but still generally lethargic and requiring MOD verbal/tactile cues to attend for meaningful participation)  Exercises Other Exercises Other Exercises: OT offers to complete light, in-bed UE therex with pt this session, but pt declines participation in therex. Other Exercises: OT engages pt in 10 ankle pumps bilaterally while teaching dtr how to assist and encourage with this task to reduce LE edema. Dtr demos understanding. Other Exercises: OT facilitates gentle LE retrograde effleurage to reduce LE edema and engages dtr in education. Dtr demos understanding.   Shoulder Instructions       General Comments      Pertinent Vitals/ Pain       Pain Assessment: Faces Faces Pain Scale: Hurts whole lot Pain Location: buttocks with any attempts to mobilize Pain Descriptors / Indicators:  Grimacing;Moaning;Tender Pain Intervention(s): Limited activity within patient's tolerance;Monitored during session;Repositioned  Home Living                                          Prior Functioning/Environment              Frequency  Min 2X/week        Progress Toward Goals  OT Goals(current goals can now be found in the care plan section)  Progress towards OT goals: Progressing toward goals  Acute Rehab OT Goals Patient Stated Goal: For her mom to feel better and get stronger OT Goal Formulation: Patient unable to participate in goal setting Time For Goal Achievement: 02/17/19 Potential to Achieve Goals: [place] Discharge plan remains appropriate    Co-evaluation                 AM-PAC OT "6 Clicks" Daily Activity     Outcome Measure   Help from another person eating meals?: A Lot Help from another person taking care of personal grooming?: A Lot Help from another person toileting, which includes using toliet, bedpan, or urinal?: Total Help from another person bathing (including washing, rinsing, drying)?: Total Help from another person to put on and taking off regular upper body clothing?: Total Help from another person to put on and taking off regular lower body clothing?: Total 6 Click Score: 8    End of Session    OT Visit Diagnosis: Muscle weakness (generalized) (M62.81);Pain   Activity Tolerance Patient limited by pain;Patient limited by lethargy   Patient Left in bed;with call bell/phone within reach;with bed alarm set;with family/visitor present   Nurse Communication (notified IV beeping, promptly addresses)        Time: [id number] OT Time Calculation (min): 23 min  Charges: OT General Charges $OT Visit: 1 Visit OT Treatments $Therapeutic Activity: 23-37 mins     Gerrianne Scale, MS, OTR/L Pager [phone] 02/08/19, 1:09 PM

## 2019-02-08 NOTE — Care Management Important Message (Signed)
Important Message  Patient Details  Name: Sydney [name] MRN: [id number] Date of Birth: 1930-06-05   Medicare Important Message Given:  Yes. CMA printed out IM for the Case Management Nurse or CSW to give to the patient.      Bryanne Riquelme 02/08/2019, 8:18 AM

## 2019-02-08 NOTE — Progress Notes (Signed)
Nutrition Follow-up  DOCUMENTATION CODES:   Severe malnutrition in context of chronic illness  INTERVENTION:  - continue Ensure Enlive, Juven, and Magic Cup BID. - continue to encourage PO intakes.  - daughter vs floor staff to feed patient.    NUTRITION DIAGNOSIS:   Severe Malnutrition related to chronic illness(TIA, prolonged hospitalization) as evidenced by severe fat depletion, severe muscle depletion, moderate fat depletion, moderate muscle depletion. -ongoing  GOAL:   Patient will meet greater than or equal to 90% of their needs -unmet  MONITOR:   PO intake, Supplement acceptance, Labs, Weight trends, Other (Comment)(GOC)  ASSESSMENT:   85 y.o. female with medical history significant of C. difficile colitis s/p stool transplant, HTN, FTT, and TIA. She presented to the ED on 10/8 due to concerns of wheezing and sacral wound. She was hospitalized 8/24-10/6 due to progressive weakness, malaise, fatigue, and significant FTT. She developed encephalopathy during hospitalization. During that admission, she was seen by ID, GI, Oncology, and Neurology but had no significant improvements in FTT. She was discharged home with home health as daughter was reluctant about SNF and hospice care. In the ED, CXR showed evidence of mild interstitial edema and small bilateral pleural effusion with significant interval improvement in the left effusion.  Central lung infection is possible but less likely. Mild, stable cardiomegaly. CT pelvis showed small soft tissue ulcer posterior to the left of the coccyx with an abscess but no evidence of osteomyelitis.  Weight today -4.6 kg/10 lb compared to admission (10/8) weight. Per flow sheet, patient is a/o to self only. Most recently documented PO intakes are 0% of breakfast and lunch on 10/11, 0% of breakfast on 10/12 and 10/14. Per review of orders, she has accepted Ensure and Juven ~20% of the time offered.   Per notes: - Ethics Committee was consulted  and recommended discharge home as soon as possible and to have social worker report to the home; last seen by Ethics Committee member on 10/13 - Palliative Care consulted and daughter indicates patient to remain full code - daughter continues to have unrealistic goals and has not accepted patient's mortality  - Surgeon's have talked with patient's daughter about wounds not healing/not being able to heal d/t very poor nutrition status - daughter has refused NGT and GI has previously determined patient is not a candidate for PEG - very poor cough reflex/ability to clear secretions--multiple instances of mucus plugging--daughter has been made aware that patient may aspirate - daughter has refused SNF so PT/OT recommending home with Home Health - concern for volume overload given hx of CHF - comfort-based approach has been recommended by several disciplines but daughter continues to adamantly refuse - metabolic acidosis--improved/stabilized - acute urinary retention--possible need for Foley d/t needing several in-and-out caths - 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 Medications reviewed; 1 mg folvite/day, PRN imodium, 400 mg megace TID, daily multivitamin with minerals, 5 ml mycostatin QID, 20 mg oral protonix/day, 500 mg KPhos x1 dose 10/12, 10 mEq IV KCl x2 runs 10/13, 20 mmol IV KPhos x1 run 10/13. IVF; D5 @ 30 ml/hr (122 kcal).  Diet Order:   Diet Order            DIET DYS 3 Room  service appropriate? Yes; Fluid consistency: Thin  Diet effective now              EDUCATION NEEDS:   Not appropriate for education at this time  Skin:  Skin Integrity Issues:: Unstageable Stage IV: L buttocks  Last BM:  10/13  Height:   Ht Readings from Last 1 Encounters:  02/02/19 5\' 6"  (1.676 m)    Weight:   Wt Readings from Last 1 Encounters:  02/08/19 47.6 kg    Ideal Body Weight:  59.1 kg  BMI:  Body mass index is 16.95 kg/m.  Estimated Nutritional Needs:   Kcal:  1800-2000 kcal  Protein:  85-95 grams  Fluid:  >/= 1.8 L/day     Jarome Matin, MS, RD, LDN, [facility] Inpatient Clinical Dietitian Pager # [phone] After hours/weekend pager # [phone]

## 2019-02-08 NOTE — Progress Notes (Signed)
PT HYDRO THERAPY NOTE  Pt's opening has merged with the smaller opening that ws inferior to the wound. Pt still with pain with movement and hydrotherapy, we treat patient as gentle as possible and reposition after as well. This time she wanted to remain on her backside due to frustrated with "rolling, rolling rolling". Mattress placed on medium firmness and alternating flow.    02/08/19 1600  Subjective Assessment  Subjective Pt is more alert with eyes open and spoke a few words. " all yall want to do is roll/roll/roll me"  Patient and Family Stated Goals per dtr "We want mama to get better and be able to walk and this wound will heal"  Pressure Injury 02/02/19 Buttocks Left Unstageable - Full thickness tissue loss in which the base of the ulcer is covered by slough (yellow, tan, gray, green or brown) and/or eschar (tan, brown or black) in the wound bed.  Date First Assessed/Time First Assessed: 02/02/19 2213   Location: Buttocks  Location Orientation: Left  Staging: Unstageable - Full thickness tissue loss in which the base of the ulcer is covered by slough (yellow, tan, gray, green or brown) and/or e...  Dressing Type Foam - Lift dressing to assess site every shift  Dressing Changed  Dressing Change Frequency Twice a day  State of Healing Non-healing  Site / Wound Assessment Purple;Painful;Granulation tissue;Dusky;Yellow  % Wound base Red or Granulating 15%  % Wound base Yellow/Fibrinous Exudate 40%  % Wound base Black/Eschar  (unable to visualize other areas due to undermining and tunne)  Margins Unattached edges (unapproximated)  Drainage Amount Minimal  Drainage Description Purulent  Treatment Cleansed;Debridement (Selective);Hydrotherapy (Pulse lavage);Packing (Saline gauze) (with santyl)  Hydrotherapy  Pulsed lavage therapy - wound location sacrum   Pulsed Lavage with Suction (psi) 8 psi  Pulsed Lavage with Suction - Normal Saline Used 1000 mL  Pulsed Lavage Tip Tip with splash  shield  Selective Debridement  Selective Debridement - Location sacrum   Selective Debridement - Tools Used Forceps;Scissors  Selective Debridement - Tissue Removed stringy sloth  Wound Therapy - Assess/Plan/Recommendations  Wound Therapy - Clinical Statement pt with less brown drainage today, less to no odor, and more granulaition in areas that I can see. However still looks like the larger area may open up to the inferior 2 smaller holes.   Wound Therapy - Functional Problem List decreasaed mobility and nutrition   Factors Delaying/Impairing Wound Healing Immobility;Multiple medical problems  Hydrotherapy Plan Debridement;Dressing change;Patient/family education;Pulsatile lavage with suction  Wound Therapy - Frequency 6X / week  Wound Therapy - Current Recommendations Case manager/social work;PT;OT   Clide Dales, PT Acute Rehabilitation Services Pager: [phone] Office: [phone] 02/08/2019

## 2019-02-08 NOTE — Progress Notes (Signed)
PROGRESS NOTE                                                                                                                                                                                                             Patient Demographics:    Sydney [name], is a 85 y.o. female, DOB - 1930/10/16, OEV:[id number]  Admit date - 02/02/2019   Admitting Physician Orene Desanctis, DO  Outpatient Primary MD for the patient is [name], MD  LOS - 6    Chief Complaint  Patient presents with   Cough   Skin Ulcer       Brief Narrative   Please refer to Dr. Teena Irani detailed summary from 10/13, in brief 85 year old female with C. difficile colitis status post transplant, hypertension, TIA, failure to thrive brought in by daughter with concern for sacral wound.  She had prolonged hospitalization from 8/24-10/6 for progressive generalized weakness, failure to thrive, weight loss.  Multiple subspecialties were involved in regards to her care.  Palliative care with goal for hospice was recommended but her daughter who is involved in her care has persistently refused. Patient had persistently poor appetite and daughter has consistently refused placement of NG tube or even a PEG tube.  Patient was discharged home after multiple discussion with the daughter that she would not show significant improvement.  Ethics committee was also involved during the previous hospital stay. She was discharged home with home health as daughter was resistant to SNF and hospice care.  Daughter brought her back to the hospital in 2 days reporting poor appetite and home health nurse concerned about her wheezing and sacral wound. During this hospitalization multiple subspecialties have been involved including surgery for her sacral wound (have started hydrotherapy), PCCM for pleural effusions (recommend this is likely from her vascular congestions).  Also involved  were ID for infected decubitus ulcer.  Neurology has seen patient previously for her critical illness myopathy.  Recommended for muscle biopsy but daughter had refused. Ethics committee was involved again.       Subjective:   Patient continues to have poor p.o. intake.  Reportedly had 3 episodes of loose bowel movement today.   Assessment  & Plan :    Principal Problem:   Failure to thrive in adult Extensive work-up done previous hospitalization with multiple specialists involved.  Was  discharged after extensive hospital stay after being felt that patient had met maximum benefit along with involvement of ethics committee. Patient has severe deconditioning with suspicion for critical illness myopathy during last hospitalization.  Neurology had evaluated with consideration for muscle biopsy due to elevated ESR and CRP to rule out myositis but daughter refused.  CK and aldolase was normal.    Daughter has declined NG or PEG tube (she is not a candidate with any benefit).  Daughter has refused option for hospice as well. Patient intermittently tachycardic possibly with dehydration and severe deconditioning. Nutrition and PT following.  Seen by SLP and on dysphagia level 3 diet.  Requesting nursing involvement closely regarding her feeding. PT recommends home health upon discharge.  Continue Megace.  IV hydration as tolerated.  Acute respiratory failure with hypoxia Combination of volume overload with underlying diastolic dysfunction, severe malnutrition and possible intermittent aspiration. Continue flutter valve, incentive spirometry and antitussive. Dysphagia level 3 diet.  Pulmonary recommended comfort based approach (which daughter has been refusing) Continue supplemental oxygen.  Was given IV Flagyl with concern for aspiration as per ID recommendation and antibiotic stopped on 10/12. Continue I/O.  Sacral decubitus ulcer stage IV with abscess Appreciate surgery and wound care  evaluation. Patient continues to have discomfort over the sacral area and needs stronger pain medication however daughter refuses and only wants Tylenol. Was on empiric IV cefazolin, antibiotic switched to oral Keflex based on wound culture (GPC and gram variable rods). Started on hydrotherapy as per surgery recommendation.  We will follow-up with surgery regarding duration of hydrotherapy  Hypokalemia Replenished  Hypophosphatemia Being replenished  Oral thrush Nystatin suspension  Normocytic anemia Stable.  Continue to monitor  Still C. difficile colitis surgical stool transplantation Noted to have increased frequency of loose stools past few days.  Antibiotic currently narrowed to oral Keflex.  Will discontinue PPI  Acute kidney injury Secondary to dehydration and infection.  Currently resolved.  Acute toxic metabolic encephalopathy Patient seems alert and awake although poorly communicative.  Does not need acute imaging at present  History of TIA Continue aspirin and Plavix   Severe protein calorie malnutrition Nutrition following.  Continue supplement and encourage p.o. intake.  Major depressive disorder, recurrent, mild Seen by psych and recommend to continue Zoloft  Code Status : Full code.  Discussed goals of care again with the daughter at bedside.  Once her mother's medical condition to be restored back to her "normal"  Family Communication  : Daughter at bedside  Disposition Plan  : Difficult effusion due to daughter's insistence to treatment options.  Will discuss with surgery regarding duration of hydrotherapy and plan on discharge home with home health  Barriers For Discharge : Active symptoms  Consults  : Surgery, ID, ethics  Procedures  : Hydrotherapy  DVT Prophylaxis  :  Lovenox -  Lab Results  Component Value Date   PLT 194 02/08/2019    Antibiotics  :  Anti-infectives (From admission, onward)   Start     Dose/Rate Route Frequency Ordered  Stop   02/06/19 1800  cephALEXin (KEFLEX) capsule 500 mg     500 mg Oral Every 6 hours 02/06/19 1500     02/04/19 1400  metroNIDAZOLE (FLAGYL) IVPB 500 mg  Status:  Discontinued     500 mg 100 mL/hr over 60 Minutes Intravenous Every 8 hours 02/04/19 1343 02/07/19 1114   02/03/19 1500  ceFAZolin (ANCEF) IVPB 1 g/50 mL premix  Status:  Discontinued     1  g 100 mL/hr over 30 Minutes Intravenous Every 8 hours 02/03/19 1444 02/06/19 1500        Objective:   Vitals:   02/08/19 0222 02/08/19 0523 02/08/19 0525 02/08/19 1331  BP: 135/84  (!) 148/113 133/76  Pulse: 97  91 84  Resp: '20  20 16  ' Temp: 98 F (36.7 C)  98.7 F (37.1 C) 98.4 F (36.9 C)  TempSrc: Oral  Oral Axillary  SpO2: 100%  94% 100%  Weight:  47.6 kg    Height:        Wt Readings from Last 3 Encounters:  02/08/19 47.6 kg  01/25/19 60.4 kg  12/16/18 45.5 kg     Intake/Output Summary (Last 24 hours) at 02/08/2019 1445 Last data filed at 02/08/2019 0947 Gross per 24 hour  Intake 0 ml  Output --  Net 0 ml     Physical Exam  Gen: Elderly frail female, fatigued, not in distress HEENT: Moist mucosa, supple neck Chest: clear b/l, no added sounds CVS: N S1&S2, no murmurs, rubs or gallop GI: soft, NT, ND, BS+, Foley in place draining dark urine Musculoskeletal: warm, no edema, sacral decubitus ulcer CNS: Alert and awake, poorly communicative    Data Review:    CBC Recent Labs  Lab 02/04/19 0540 02/05/19 0525 02/06/19 0459 02/07/19 0509 02/08/19 0543  WBC 23.8* 22.5* 15.1* 13.9* 11.2*  HGB 9.7* 8.9* 8.9* 8.9* 8.7*  HCT 29.7* 27.2* 27.5* 27.2* 27.0*  PLT 233 212 204 187 194  MCV 95.5 95.8 98.6 96.8 98.2  MCH 31.2 31.3 31.9 31.7 31.6  MCHC 32.7 32.7 32.4 32.7 32.2  RDW 17.5* 17.4* 17.5* 17.8* 18.4*  LYMPHSABS 0.6* 1.0 0.4* 0.6* 0.4*  MONOABS 1.0 1.1* 0.7 0.6 0.6  EOSABS 0.0 0.0 0.0 0.0 0.0  BASOSABS 0.0 0.0 0.0 0.0 0.0    Chemistries  Recent Labs  Lab 02/04/19 0540 02/05/19 0525  02/06/19 0459 02/07/19 0509 02/08/19 0543  NA 142 142 142 141 138  K 3.4* 3.3* 3.1* 3.0* 3.3*  CL 110 110 109 110 109  CO2 19* 19* '23 22 22  ' GLUCOSE 146* 95 100* 100* 111*  BUN '20 22 18 21 23  ' CREATININE 0.95 1.08* 0.80 0.83 0.99  CALCIUM 8.1* 8.0* 7.9* 7.8* 7.7*  MG 1.9 1.8 2.0 1.9 1.8  AST 35 35 '30 23 21  ' ALT '24 14 8 5 6  ' ALKPHOS 132* 122 112 111 123  BILITOT 1.0 1.1 1.0 0.9 1.1   ------------------------------------------------------------------------------------------------------------------ No results for input(s): CHOL, HDL, LDLCALC, TRIG, CHOLHDL, LDLDIRECT in the last 72 hours.  Lab Results  Component Value Date   HGBA1C 5.8 (H) 10/03/2017   ------------------------------------------------------------------------------------------------------------------ No results for input(s): TSH, T4TOTAL, T3FREE, THYROIDAB in the last 72 hours.  Invalid input(s): FREET3 ------------------------------------------------------------------------------------------------------------------ No results for input(s): VITAMINB12, FOLATE, FERRITIN, TIBC, IRON, RETICCTPCT in the last 72 hours.  Coagulation profile No results for input(s): INR, PROTIME in the last 168 hours.  No results for input(s): DDIMER in the last 72 hours.  Cardiac Enzymes No results for input(s): CKMB, TROPONINI, MYOGLOBIN in the last 168 hours.  Invalid input(s): CK ------------------------------------------------------------------------------------------------------------------ No results found for: BNP  Inpatient Medications  Scheduled Meds:  aspirin EC  81 mg Oral Daily   cephALEXin  500 mg Oral Q6H   Chlorhexidine Gluconate Cloth  6 each Topical Daily   clopidogrel  75 mg Oral Daily   collagenase   Topical Daily   feeding supplement (ENSURE ENLIVE)  237 mL Oral BID BM  folic acid  1 mg Oral Daily   Gerhardt's butt cream  1 application Topical TID   guaiFENesin  1,200 mg Oral BID    latanoprost  1 drop Both Eyes QHS   megestrol  400 mg Oral TID   multivitamin with minerals  1 tablet Oral Daily   nutrition supplement (JUVEN)  1 packet Oral BID BM   nystatin  5 mL Oral QID   nystatin cream   Topical BID   pantoprazole  20 mg Oral Daily   phosphorus  500 mg Oral Once   sertraline  25 mg Oral Daily   Continuous Infusions:  dextrose 30 mL/hr at 02/08/19 1312   PRN Meds:.acetaminophen **OR** acetaminophen, ipratropium, ketorolac, levalbuterol, loperamide, traMADol  Micro Results Recent Results (from the past 240 hour(s))  SARS CORONAVIRUS 2 (TAT 6-24 HRS) Nasopharyngeal Nasopharyngeal Swab     Status: None   Collection Time: 02/02/19  6:43 PM   Specimen: Nasopharyngeal Swab  Result Value Ref Range Status   SARS Coronavirus 2 NEGATIVE NEGATIVE Final    Comment: (NOTE) SARS-CoV-2 target nucleic acids are NOT DETECTED. The SARS-CoV-2 RNA is generally detectable in upper and lower respiratory specimens during the acute phase of infection. Negative results do not preclude SARS-CoV-2 infection, do not rule out co-infections with other pathogens, and should not be used as the sole basis for treatment or other patient management decisions. Negative results must be combined with clinical observations, patient history, and epidemiological information. The expected result is Negative. Fact Sheet for Patients: SugarRoll.be Fact Sheet for Healthcare Providers: https://www.woods-mathews.com/ This test is not yet approved or cleared by the [place] FDA and  has been authorized for detection and/or diagnosis of SARS-CoV-2 by FDA under an Emergency Use Authorization (EUA). This EUA will remain  in effect (meaning this test can be used) for the duration of the COVID-19 declaration under Section 56 4(b)(1) of the Act, 21 U.S.C. section 360bbb-3(b)(1), unless the authorization is terminated or revoked sooner. Performed at  [facility], [place] [address] [place], [place] [address]   Aerobic Culture (superficial specimen)     Status: None   Collection Time: 02/03/19 11:46 AM   Specimen: Sacral; Wound  Result Value Ref Range Status   Specimen Description   Final    SACRAL Performed at [facility] [address], [place], [place] [address]    Special Requests   Final    NONE Performed at [facility], [place] [address] [place], [place] [address]    Gram Stain   Final    RARE WBC PRESENT,BOTH PMN AND MONONUCLEAR ABUNDANT GRAM POSITIVE COCCI IN PAIRS ABUNDANT GRAM VARIABLE ROD Performed at [facility], [place] [address] [place],  [address]    Culture   Final    ABUNDANT STREPTOCOCCUS ANGINOSIS FEW STAPHYLOCOCCUS AUREUS    Report Status 02/06/2019 FINAL  Final   Organism ID, Bacteria STREPTOCOCCUS ANGINOSIS  Final   Organism ID, Bacteria STAPHYLOCOCCUS AUREUS  Final      Susceptibility   Staphylococcus aureus - MIC*    CIPROFLOXACIN <=0.5 SENSITIVE Sensitive     ERYTHROMYCIN <=0.25 SENSITIVE Sensitive     GENTAMICIN <=0.5 SENSITIVE Sensitive     OXACILLIN 0.5 SENSITIVE Sensitive     TETRACYCLINE <=1 SENSITIVE Sensitive     VANCOMYCIN <=0.5 SENSITIVE Sensitive     TRIMETH/SULFA <=10 SENSITIVE Sensitive     CLINDAMYCIN <=0.25 SENSITIVE Sensitive     RIFAMPIN <=0.5 SENSITIVE Sensitive  Inducible Clindamycin NEGATIVE Sensitive     * FEW STAPHYLOCOCCUS AUREUS   Streptococcus anginosis - MIC*    PENICILLIN <=0.06 SENSITIVE Sensitive     ERYTHROMYCIN <=0.12 SENSITIVE Sensitive     LEVOFLOXACIN 0.5 SENSITIVE Sensitive     VANCOMYCIN 0.5 SENSITIVE Sensitive     * ABUNDANT STREPTOCOCCUS ANGINOSIS    Radiology Reports Dg Abd 1 View  Result Date: 01/17/2019 CLINICAL DATA:  85 year old female with a history of enteric tube placement EXAM: ABDOMEN - 1 VIEW; DG NASO G TUBE PLC W/FL-NO RAD COMPARISON:  January 13, 2019 FINDINGS: Limited plain  film demonstrates post pyloric tube terminating in the 3/4 portion of the duodenum. IMPRESSION: Post pyloric feeding tube terminates in the 3/4 portion the duodenum. Electronically Signed   By: Corrie Mckusick D.O.   On: 01/17/2019 15:55   Dg Abd 1 View  Result Date: 01/13/2019 CLINICAL DATA:  Blocked feeding tube. EXAM: ABDOMEN - 1 VIEW COMPARISON:  12/29/2018 FINDINGS: Enteric fusion tube is present with tip over the region of the distal stomach just right of midline in the mid abdomen. Bowel gas pattern is nonobstructive. Remainder of the exam is unchanged. IMPRESSION: Nonobstructive bowel gas pattern. Enteric feeding tube with tip right of midline in the mid abdomen likely over the distal stomach. Electronically Signed   By: Marin Olp M.D.   On: 01/13/2019 12:28   Ct Pelvis W Contrast  Result Date: 02/02/2019 CLINICAL DATA:  Sacral decubitus ulcer. EXAM: CT PELVIS WITH CONTRAST TECHNIQUE: Multidetector CT imaging of the pelvis was performed using the standard protocol following the bolus administration of intravenous contrast. CONTRAST:  42m OMNIPAQUE IOHEXOL 300 MG/ML  SOLN COMPARISON:  05/02/2018 FINDINGS: Urinary Tract:  No abnormality visualized. Bowel:  Unremarkable visualized pelvic bowel loops. Vascular/Lymphatic: Atheromatous arterial calcifications without aneurysm. No enlarged lymph nodes. Reproductive: Small uterus containing multiple small, densely calcified fibroids. No adnexal mass. Other: Small superficial soft tissue defect posteriorly to the left of the coccyx. This does not extend to the bone and there is no bone destruction or periosteal reaction. Slightly more inferiorly, there is a very small fluid and gas collection, measuring 1.0 x 0.4 cm on image number 35 series 2 and 1.9 cm in length on coronal image number 78. Also noted is diffuse subcutaneous edema. The previously demonstrated bilateral inguinal hernias are no longer seen. Musculoskeletal: No bone destruction or periosteal  reaction. Lower lumbar spine degenerative changes. IMPRESSION: 1. Small soft tissue ulcer posteriorly to the left of the coccyx with a 1.9 x 1.0 x 0.4 cm abscess slightly more inferiorly. 2. No evidence of osteomyelitis. 3. Diffuse subcutaneous edema. Electronically Signed   By: SClaudie ReveringM.D.   On: 02/02/2019 17:13   Mr Cervical Spine Wo Contrast  Result Date: 01/29/2019 CLINICAL DATA:  85year old female with ataxia. Unexplained lower extremity weakness, spasticity. EXAM: MRI CERVICAL SPINE WITHOUT CONTRAST TECHNIQUE: Multiplanar, multisequence MR imaging of the cervical spine was performed. No intravenous contrast was administered. COMPARISON:  Brain MRI 12/21/2018.  Neck CT 10/13/2017. FINDINGS: Alignment: Preserved cervical lordosis similar to the 2019 neck CT. No spondylolisthesis. Vertebrae: No marrow edema or evidence of acute osseous abnormality. Visualized bone marrow signal is within normal limits. Cord: Suboptimal spinal cord detail due to motion artifact despite repeated imaging attempts, but no convincing cervical or upper thoracic spinal cord signal abnormality. Fairly capacious spinal canal. Posterior Fossa, vertebral arteries, paraspinal tissues: Stable cervicomedullary junction and visible posterior fossa. Preserved major vascular flow voids in the neck, the left  vertebral artery appears mildly dominant. Disc levels: Mild for age cervical spine degeneration with no cervical or upper thoracic spinal stenosis. There is i generally mild cervical neural foraminal stenosis despite some levels of facet hypertrophy. There is up to moderate stenosis at the left C4 nerve level. IMPRESSION: Essentially negative for age MRI appearance of the cervical spine. No spinal stenosis, and no cord signal abnormality identified although cord detail is suboptimal due to motion. Electronically Signed   By: Genevie Ann M.D.   On: 01/29/2019 15:12   Mr Thoracic Spine Wo Contrast  Result Date: 01/29/2019 CLINICAL  DATA:  85 year old female with ataxia. Unexplained lower extremity weakness, spasticity. EXAM: MRI THORACIC SPINE WITHOUT CONTRAST TECHNIQUE: Multiplanar, multisequence MR imaging of the thoracic spine was performed. No intravenous contrast was administered. COMPARISON:  Cervical spine MRI today reported separately. FINDINGS: The examination had to be discontinued prior to completion due to patient pain. Only 1 set of axial images was obtained, and the study is intermittently degraded by motion. Limited cervical spine imaging:  Reported separately today. Thoracic spine segmentation:  Appears to be normal. Alignment: Mildly exaggerated thoracic kyphosis. No spondylolisthesis. Vertebrae: No marrow edema or evidence of acute osseous abnormality. Visualized bone marrow signal is within normal limits. Cord: No definite thoracic spinal cord signal abnormality is identified. The conus appears normal at T11-T12. There is questionable volume loss of the thoracic cord, somewhat generalized (e.g. So at the T2-T3 level series 22, image 8, and again at the T8-T9 level series 22, image 25). As in the cervical spine, the spinal canal is capacious. Paraspinal and other soft tissues: Small to moderate layering left left greater than right pleural effusions. Posterior paraspinal soft tissues appear within normal limits. Disc levels: Mild for age thoracic spine degeneration similar to that in the cervical spine. No spinal or foraminal stenosis. IMPRESSION: 1. Questionable generalized thoracic spinal cord volume loss, but no definite focal cord signal abnormality. Cord detail is suboptimal due to motion. 2. Mild for age thoracic spine degeneration and capacious spinal canal. 3. Left greater than right layering pleural effusions. Electronically Signed   By: Genevie Ann M.D.   On: 01/29/2019 15:25   US Renal  Result Date: 02/03/2019 CLINICAL DATA:  Urinary retention EXAM: RENAL / URINARY TRACT ULTRASOUND COMPLETE COMPARISON:  CT dated  01/06/2019 FINDINGS: Right Kidney: Renal measurements: 8.8 x 4.2 x 3.4 cm = volume: 64 mL. There is mild hydronephrosis. Left Kidney: Renal measurements: 8.8 x 3.9 x 2.7 cm = volume: 48.7 mL. There is mild hydronephrosis. Bladder: Both ureteral jets were noted. There is free fluid in the patient's pelvis. IMPRESSION: 1. Mild bilateral hydronephrosis. 2. Both ureteral jets were visualized. 3. Free fluid in the pelvis. Electronically Signed   By: Constance Holster M.D.   On: 02/03/2019 18:15   Mr Femur Left Wo Contrast  Result Date: 01/30/2019 CLINICAL DATA:  Myelopathy, acute or progressive. Evaluate for myositis. EXAM: MR OF THE LEFT FEMUR WITHOUT CONTRAST TECHNIQUE: Coronal T1 and inversion recovery imaging of both thighs was performed. The patient refused additional imaging. No intravenous contrast was administered. COMPARISON:  Pelvic CT 01/06/2019. FINDINGS: Bones/Joint/Cartilage There is no evidence of acute fracture, dislocation or femoral head avascular necrosis. There are mild degenerative changes of both hips, similar to previous CT. Mild degenerative changes are also present in both knees, greater on the right. No large joint effusions. Ligaments Not relevant for examination/indication. Muscles and Tendons There is generalized soft tissue edema with mild T2 hyperintensity throughout the  subcutaneous fat and muscles of both thighs. No focal fluid collection or muscular atrophy identified. No gross tendon abnormalities on coronal imaging. Soft tissues As above, generalized soft tissue edema throughout both thighs. No focal fluid collections are identified. IMPRESSION: 1. Limited study, consisting of only coronal imaging of both thighs. 2. Nonspecific generalized soft tissue edema involving the muscles and subcutaneous fat of both thighs. Cannot exclude myositis. No focal fluid collection or muscular atrophy identified. 3. No acute osseous findings. Mild degenerative changes of both hips and knees.  Electronically Signed   By: Richardean Sale M.D.   On: 01/30/2019 14:24   Dg Chest Port 1 View  Result Date: 02/07/2019 CLINICAL DATA:  Shortness of breath. Rhonchi. EXAM: PORTABLE CHEST 1 VIEW COMPARISON:  Chest x-ray 02/04/2019 and CT scan of the abdomen dated 01/06/2019 FINDINGS: Stable borderline cardiomegaly. Persistent consolidation/atelectasis/effusion at the left lung base. Clearing of the faint left perihilar infiltrate. Small right pleural effusion is slightly more prominent. The hazy infiltrate in the right upper lobe is slightly more extensive but less dense and the right perihilar infiltrate has almost resolved. Aortic atherosclerosis.  No acute bone abnormality. IMPRESSION: 1. Persistent consolidation/atelectasis at the left lung base. 2. Clearing of the left perihilar infiltrate. 3. Slightly more extensive but less dense infiltrate in the right upper lobe. 4. Small right pleural effusion, slightly increased. 5.  Aortic Atherosclerosis (ICD10-I70.0). Electronically Signed   By: Lorriane Shire M.D.   On: 02/07/2019 15:31   Dg Chest Port 1 View  Result Date: 02/04/2019 CLINICAL DATA:  Shortness of breath this morning. EXAM: PORTABLE CHEST 1 VIEW COMPARISON:  02/03/2019 FINDINGS: Stable enlarged cardiac silhouette and dense left lower lobe airspace opacity. No significant change in overall amount of patchy opacity in the right upper lobe and both perihilar regions. Stable small right pleural effusion and probable small left pleural effusion. Diffuse osteopenia. IMPRESSION: 1. No significant change in dense left lower lobe atelectasis or pneumonia. 2. No significant change in probable pneumonia in the right upper lobe and both perihilar regions. Alveolar pulmonary edema is less likely in the absence of pulmonary vascular congestion and interstitial pulmonary edema. 3. Stable small right pleural effusion and probable small left pleural effusion. 4. Stable cardiomegaly. Electronically Signed   By:  Claudie Revering M.D.   On: 02/04/2019 11:28   Dg Chest Port 1 View  Result Date: 02/03/2019 CLINICAL DATA:  Wheezing. EXAM: PORTABLE CHEST 1 VIEW COMPARISON:  February 02, 2019 FINDINGS: Enlarged cardiac silhouette. Calcific atherosclerotic disease of the aorta. Bilateral moderate in size pleural effusions. Worsening aeration of the lungs with focal airspace opacity in the right upper lobe. Mild pulmonary edema. Osseous structures are without acute abnormality. Soft tissues are grossly normal. IMPRESSION: 1. Bilateral moderate in size pleural effusions. 2. Worsening aeration of the lungs with focal airspace opacity in the right upper lobe. 3. Mild pulmonary edema. 4. Calcific atherosclerotic disease of the aorta. Electronically Signed   By: Fidela Salisbury M.D.   On: 02/03/2019 17:38   Dg Chest Port 1 View  Result Date: 02/02/2019 CLINICAL DATA:  Cough and tachypnea with tachycardia. EXAM: PORTABLE CHEST 1 VIEW COMPARISON:  01/23/2019 FINDINGS: Patient slightly rotated to the left. Lungs are adequately inflated demonstrate hazy bilateral perihilar opacification likely mild interstitial edema. Suggestion of small amount of bilateral pleural fluid. Left effusion improved compared to previous exams. Mild stable cardiomegaly. Remainder of the exam is unchanged. IMPRESSION: Evidence of mild interstitial edema and small bilateral pleural effusions with significant interval  improvement in the left effusion. Central lung infection is possible but less likely. Mild stable cardiomegaly. Electronically Signed   By: Marin Olp M.D.   On: 02/02/2019 16:04   Dg Chest Port 1 View  Result Date: 01/23/2019 CLINICAL DATA:  Cough. EXAM: PORTABLE CHEST 1 VIEW COMPARISON:  Radiograph 01/13/2019 FINDINGS: Enteric tube has been removed. Lower lung volumes from prior. Patient is rotated. Cardiomegaly is more prominent than on prior exam. Unchanged mediastinal contours with aortic atherosclerosis. Moderate bilateral pleural  effusions and associated bibasilar opacities, left greater than right. No evidence of pulmonary edema. No confluent airspace disease. No pneumothorax. IMPRESSION: 1. Moderate bilateral pleural effusions with associated bibasilar opacities, left greater than right, likely atelectasis. 2. Cardiomegaly is more prominent than on prior exam. Aortic Atherosclerosis (ICD10-I70.0). Electronically Signed   By: Keith Rake M.D.   On: 01/23/2019 19:30   Dg Chest Port 1 View  Result Date: 01/13/2019 CLINICAL DATA:  Patient is spitting up 2 feeds. Possible aspiration. EXAM: PORTABLE CHEST 1 VIEW COMPARISON:  Single-view of the chest 12/16/2018. FINDINGS: Feeding tube courses into the stomach and below the inferior margin of the film. The patient has moderate bilateral pleural effusions. Mild basilar atelectasis noted. There is cardiomegaly without edema. No pneumothorax. No acute or focal bony abnormality. IMPRESSION: Moderate layering pleural effusions with mild bibasilar atelectasis. Cardiomegaly without edema. Electronically Signed   By: Inge Rise M.D.   On: 01/13/2019 09:47   Dg Addison Bailey G Tube Plc W/fl-no Rad  Result Date: 01/17/2019 CLINICAL DATA:  85 year old female with a history of enteric tube placement EXAM: ABDOMEN - 1 VIEW; DG NASO G TUBE PLC W/FL-NO RAD COMPARISON:  January 13, 2019 FINDINGS: Limited plain film demonstrates post pyloric tube terminating in the 3/4 portion of the duodenum. IMPRESSION: Post pyloric feeding tube terminates in the 3/4 portion the duodenum. Electronically Signed   By: Corrie Mckusick D.O.   On: 01/17/2019 15:55   US Liver Doppler  Result Date: 01/13/2019 CLINICAL DATA:  85 year old female with elevated liver enzymes. Evaluate for portal vein stenosis/occlusion. EXAM: DUPLEX ULTRASOUND OF LIVER TECHNIQUE: Color and duplex Doppler ultrasound was performed to evaluate the hepatic in-flow and out-flow vessels. COMPARISON:  Prior CT abdomen/pelvis 01/06/2019 FINDINGS:  Liver: Normal parenchymal echogenicity. Normal hepatic contour without nodularity. No focal lesion, mass or intrahepatic biliary ductal dilatation. Portal Vein Velocities Main:  35 cm/sec with normal hepatopetal flow. Right:  23 cm/sec with normal hepatopetal flow. Left:  27 cm/sec with normal hepatopetal flow. Hepatic Vein Velocities Right:  16 cm/sec Middle:  27 cm/sec Left:  23 cm/sec IVC: Present and patent with normal respiratory phasicity. Hepatic Artery Velocity:  102 cm/sec Splenic Vein Velocity:  31 cm/sec Varices: Absent Ascites: Small volume ascites. Other: Bilateral pleural effusions are noted incidentally. IMPRESSION: 1. Patent and normal appearing portal and hepatic veins. 2. Small volume ascites. 3. Bilateral moderate pleural effusions. 4. Normal sonographic appearance of the liver. Electronically Signed   By: Jacqulynn Cadet M.D.   On: 01/13/2019 16:00    Time Spent in minutes  25   Aundrea Higginbotham M.D on 02/08/2019 at 2:45 PM  Between 7am to 7pm - Pager - [phone]  After 7pm go to [email or web address] - password [facility]  [facility] -  Office  [phone]

## 2019-02-08 NOTE — Consult Note (Signed)
[facility] Consult   Reason for Consult:  Agitation  Referring Physician:  Dr Alfredia Ferguson Patient Identification: Sydney [name] MRN:  [id number] Principal Diagnosis: Failure to thrive in adult Diagnosis:  Principal Problem:   Failure to thrive in adult Active Problems:   Protein calorie malnutrition ([place])   Ataxia   MDD (major depressive disorder)   Sacral ulcer ([place])   SOB (shortness of breath)   Wheezing   TIA (transient ischemic attack)   PVD (peripheral vascular disease) ([place])   Total Time spent with patient: 1 hour  Subjective:   Sydney [name] is a 85 y.o. female patient admitted with failure to thrive.  HPI: 85 year old female admitted for failure to thrive and complications.  Patient seen and evaluated in person by this provider for agitation.  She was calm and cooperative on assessment with her daughter at the bedside.  Daughter and this provider spoke outside of her room.  Her daughter reports that her mother is "a sweetie pie" and does not see agitation.  She does report some restlessness with pain but she nor her mother want any pain medication adjustments.  Daughter only sees this when she is turned versus allowing her to self turn.  Daughter lives with her mother and has cared for her for a while.  She has no concerns regarding agitation or anxiety nor does her mother.  Both reports some depression related to her current health issues, recommend continuing sertraline 25 mg daily.  Daughter is a patient advocate for what her mother wants and her mother does not want any medication changes at this time in regards to pain or agitation.  Past Psychiatric History: depression  Risk to Self:  None Risk to Others:  None Prior Inpatient Therapy:  None Prior Outpatient Therapy:  None  Past Medical History:  Past Medical History:  Diagnosis Date  . Anemia    years ago  . Aortic atherosclerosis ([place])   . Arthritis    "left hand" (02/17/2013)  . Bilateral  inguinal hernia    INCIDENTAL CT 1/20  . C. difficile diarrhea   . Cataract   . Dehydration 12/29/2018  . Diplopia    CRANIAL 6 NERVE PALSY  . Dizzy   . Dyslipidemia   . Gastritis   . Glaucoma   . High cholesterol    "on RX years ago" (02/17/2013)  . Hypertension   . Leukopenia   . Meningioma ([place]) 05/01/2015  . Osteoporosis 02/2014   T score -2.5  followed by Dr. Lysle Rubens  . Palpitations    PACs, PVCs and short runs of atrial tachycardia on Holter monitoring  . PVD (peripheral vascular disease) ([place])   . Rhinitis   . Syncopal episodes 2003  . TIA (transient ischemic attack) 1990's  . Trigger thumb of left hand   . Weakness 12/29/2018  . Winter itch     Past Surgical History:  Procedure Laterality Date  . ANAL FISTULECTOMY  1970  . COLONOSCOPY    . COLONOSCOPY WITH PROPOFOL N/A 12/02/2016   Procedure: COLONOSCOPY WITH PROPOFOL;  Surgeon: Gatha Mayer, MD;  Location: [facility] ENDOSCOPY;  Service: Endoscopy;  Laterality: N/A;  . FECAL TRANSPLANT N/A 12/02/2016   Procedure: FECAL TRANSPLANT;  Surgeon: Gatha Mayer, MD;  Location: [facility] ENDOSCOPY;  Service: Endoscopy;  Laterality: N/A;  . TONSILLECTOMY     Family History:  Family History  Problem Relation Age of Onset  . Hypertension Father   . Heart attack Father   .  Stroke Brother   . Stroke Maternal Grandmother    Family Psychiatric  History: None Social History:  Social History   Substance and Sexual Activity  Alcohol Use No     Social History   Substance and Sexual Activity  Drug Use No    Social History   Socioeconomic History  . Marital status: Widowed    Spouse name: Not on file  . Number of children: 1  . Years of education: 28  . Highest education level: Not on file  Occupational History  . Occupation: retired  Scientific laboratory technician  . Financial resource strain: Not on file  . Food insecurity    Worry: Not on file    Inability: Not on file  . Transportation needs    Medical: Not on file    Non-medical: Not  on file  Tobacco Use  . Smoking status: Never Smoker  . Smokeless tobacco: Never Used  Substance and Sexual Activity  . Alcohol use: No  . Drug use: No  . Sexual activity: Not Currently    Comment: 1st intercourse 85 yo-Fewer than 5 partners  Lifestyle  . Physical activity    Days per week: Not on file    Minutes per session: Not on file  . Stress: Not on file  Relationships  . Social Herbalist on phone: Not on file    Gets together: Not on file    Attends religious service: Not on file    Active member of club or organization: Not on file    Attends meetings of clubs or organizations: Not on file    Relationship status: Not on file  Other Topics Concern  . Not on file  Social History Narrative   Patient does not drink caffeine.   Patient is right handed.    Additional Social History:    Allergies:   Allergies  Allergen Reactions  . Demerol [Meperidine] Other (See Comments)    Excessive sweating, cramping  . Other     No Antibiotic without consultation with her primary physician.  . Codeine Rash  . Sulfa Antibiotics Itching and Other (See Comments)    Reaction unknown  . Tomato Itching    Labs:  Results for orders placed or performed during the hospital encounter of 02/02/19 (from the past 48 hour(s))  Glucose, capillary     Status: None   Collection Time: 02/06/19 12:25 PM  Result Value Ref Range   Glucose-Capillary 79 70 - 99 mg/dL  Glucose, capillary     Status: None   Collection Time: 02/06/19  4:08 PM  Result Value Ref Range   Glucose-Capillary 72 70 - 99 mg/dL  Glucose, capillary     Status: Abnormal   Collection Time: 02/06/19  8:55 PM  Result Value Ref Range   Glucose-Capillary 63 (L) 70 - 99 mg/dL  Glucose, capillary     Status: Abnormal   Collection Time: 02/06/19 10:36 PM  Result Value Ref Range   Glucose-Capillary 116 (H) 70 - 99 mg/dL  Glucose, capillary     Status: None   Collection Time: 02/07/19 12:49 AM  Result Value Ref  Range   Glucose-Capillary 90 70 - 99 mg/dL  Glucose, capillary     Status: None   Collection Time: 02/07/19  4:17 AM  Result Value Ref Range   Glucose-Capillary 89 70 - 99 mg/dL  Magnesium     Status: None   Collection Time: 02/07/19  5:09 AM  Result Value Ref Range  Magnesium 1.9 1.7 - 2.4 mg/dL    Comment: Performed at [facility], [place] [address], [place], [place] [address]  Phosphorus     Status: Abnormal   Collection Time: 02/07/19  5:09 AM  Result Value Ref Range   Phosphorus 2.3 (L) 2.5 - 4.6 mg/dL    Comment: Performed at [facility], [place] [address] [place], [place] [address]  CBC with Differential/Platelet     Status: Abnormal   Collection Time: 02/07/19  5:09 AM  Result Value Ref Range   WBC 13.9 (H) 4.0 - 10.5 K/uL   RBC 2.81 (L) 3.87 - 5.11 MIL/uL   Hemoglobin 8.9 (L) 12.0 - 15.0 g/dL   HCT 27.2 (L) 36.0 - 46.0 %   MCV 96.8 80.0 - 100.0 fL   MCH 31.7 26.0 - 34.0 pg   MCHC 32.7 30.0 - 36.0 g/dL   RDW 17.8 (H) 11.5 - 15.5 %   Platelets 187 150 - 400 K/uL   nRBC 0.2 0.0 - 0.2 %   Neutrophils Relative % 90 %   Neutro Abs 12.4 (H) 1.7 - 7.7 K/uL   Lymphocytes Relative 4 %   Lymphs Abs 0.6 (L) 0.7 - 4.0 K/uL   Monocytes Relative 4 %   Monocytes Absolute 0.6 0.1 - 1.0 K/uL   Eosinophils Relative 0 %   Eosinophils Absolute 0.0 0.0 - 0.5 K/uL   Basophils Relative 0 %   Basophils Absolute 0.0 0.0 - 0.1 K/uL   Immature Granulocytes 2 %   Abs Immature Granulocytes 0.28 (H) 0.00 - 0.07 K/uL    Comment: Performed at [facility], [place] [address] [place], [place] [address]  Comprehensive metabolic panel     Status: Abnormal   Collection Time: 02/07/19  5:09 AM  Result Value Ref Range   Sodium 141 135 - 145 mmol/L   Potassium 3.0 (L) 3.5 - 5.1 mmol/L   Chloride 110 98 - 111 mmol/L   CO2 22 22 - 32 mmol/L   Glucose, Bld 100 (H) 70 - 99 mg/dL   BUN 21 8 - 23 mg/dL   Creatinine, Ser 0.83 0.44 - 1.00 mg/dL    Calcium 7.8 (L) 8.9 - 10.3 mg/dL   Total Protein 5.6 (L) 6.5 - 8.1 g/dL   Albumin 2.0 (L) 3.5 - 5.0 g/dL   AST 23 15 - 41 U/L   ALT 5 0 - 44 U/L   Alkaline Phosphatase 111 38 - 126 U/L   Total Bilirubin 0.9 0.3 - 1.2 mg/dL   GFR calc non Af Amer >60 >60 mL/min   GFR calc Af Amer >60 >60 mL/min   Anion gap 9 5 - 15    Comment: Performed at [facility], [place] [address], [place], [place] [address]  Glucose, capillary     Status: None   Collection Time: 02/07/19  7:41 AM  Result Value Ref Range   Glucose-Capillary 86 70 - 99 mg/dL  Glucose, capillary     Status: Abnormal   Collection Time: 02/07/19 11:57 AM  Result Value Ref Range   Glucose-Capillary 105 (H) 70 - 99 mg/dL  Glucose, capillary     Status: Abnormal   Collection Time: 02/07/19  5:00 PM  Result Value Ref Range   Glucose-Capillary 123 (H) 70 - 99 mg/dL  Glucose, capillary     Status: Abnormal   Collection Time: 02/07/19  9:37 PM  Result Value Ref Range   Glucose-Capillary 125 (H) 70 -  99 mg/dL  Glucose, capillary     Status: Abnormal   Collection Time: 02/08/19 12:50 AM  Result Value Ref Range   Glucose-Capillary 106 (H) 70 - 99 mg/dL  Magnesium     Status: None   Collection Time: 02/08/19  5:43 AM  Result Value Ref Range   Magnesium 1.8 1.7 - 2.4 mg/dL    Comment: Performed at [facility], [place] [address], [place], [place] [address]  Phosphorus     Status: None   Collection Time: 02/08/19  5:43 AM  Result Value Ref Range   Phosphorus 2.7 2.5 - 4.6 mg/dL    Comment: Performed at [facility], [place] [address] [place], [place] [address]  CBC with Differential/Platelet     Status: Abnormal   Collection Time: 02/08/19  5:43 AM  Result Value Ref Range   WBC 11.2 (H) 4.0 - 10.5 K/uL   RBC 2.75 (L) 3.87 - 5.11 MIL/uL   Hemoglobin 8.7 (L) 12.0 - 15.0 g/dL   HCT 27.0 (L) 36.0 - 46.0 %   MCV 98.2 80.0 - 100.0 fL   MCH 31.6 26.0 - 34.0 pg   MCHC 32.2 30.0 - 36.0  g/dL   RDW 18.4 (H) 11.5 - 15.5 %   Platelets 194 150 - 400 K/uL   nRBC 0.4 (H) 0.0 - 0.2 %   Neutrophils Relative % 89 %   Neutro Abs 10.0 (H) 1.7 - 7.7 K/uL   Lymphocytes Relative 4 %   Lymphs Abs 0.4 (L) 0.7 - 4.0 K/uL   Monocytes Relative 5 %   Monocytes Absolute 0.6 0.1 - 1.0 K/uL   Eosinophils Relative 0 %   Eosinophils Absolute 0.0 0.0 - 0.5 K/uL   Basophils Relative 0 %   Basophils Absolute 0.0 0.0 - 0.1 K/uL   Immature Granulocytes 2 %   Abs Immature Granulocytes 0.19 (H) 0.00 - 0.07 K/uL    Comment: Performed at [facility], [place] [address], [place], [place] [address]  Comprehensive metabolic panel     Status: Abnormal   Collection Time: 02/08/19  5:43 AM  Result Value Ref Range   Sodium 138 135 - 145 mmol/L   Potassium 3.3 (L) 3.5 - 5.1 mmol/L   Chloride 109 98 - 111 mmol/L   CO2 22 22 - 32 mmol/L   Glucose, Bld 111 (H) 70 - 99 mg/dL   BUN 23 8 - 23 mg/dL   Creatinine, Ser 0.99 0.44 - 1.00 mg/dL   Calcium 7.7 (L) 8.9 - 10.3 mg/dL   Total Protein 5.8 (L) 6.5 - 8.1 g/dL   Albumin 2.1 (L) 3.5 - 5.0 g/dL   AST 21 15 - 41 U/L   ALT 6 0 - 44 U/L   Alkaline Phosphatase 123 38 - 126 U/L   Total Bilirubin 1.1 0.3 - 1.2 mg/dL   GFR calc non Af Amer 52 (L) >60 mL/min   GFR calc Af Amer 60 (L) >60 mL/min   Anion gap 7 5 - 15    Comment: Performed at [facility], [place] [address], [place], [place] [address]  Glucose, capillary     Status: Abnormal   Collection Time: 02/08/19  8:09 AM  Result Value Ref Range   Glucose-Capillary 100 (H) 70 - 99 mg/dL    Current Facility-Administered Medications  Medication Dose Route Frequency Provider Last Rate Last Dose  . acetaminophen (TYLENOL) tablet 650 mg  650 mg Oral Q6H PRN Tu, Ching T,  DO   650 mg at 02/03/19 2019   Or  . acetaminophen (TYLENOL) suppository 650 mg  650 mg Rectal Q6H PRN Tu, Ching T, DO   650 mg at 02/07/19 1604  . aspirin EC tablet 81 mg  81 mg Oral Daily Tu, Ching T, DO   81  mg at 02/08/19 0945  . cephALEXin (KEFLEX) capsule 500 mg  500 mg Oral Q6H Thayer Headings, MD   500 mg at 02/07/19 2335  . Chlorhexidine Gluconate Cloth 2 % PADS 6 each  6 each Topical Daily Raiford Noble [place], [place]   6 each at 02/08/19 [id number]  . clopidogrel (PLAVIX) tablet 75 mg  75 mg Oral Daily Tu, Ching T, DO   75 mg at 02/08/19 0945  . collagenase (SANTYL) ointment   Topical Daily Saverio Danker, PA-C      . dextrose 5 % solution   Intravenous Continuous Raiford Noble [place], DO 30 mL/hr at 02/07/19 1609    . feeding supplement (ENSURE ENLIVE) (ENSURE ENLIVE) liquid 237 mL  237 mL Oral BID BM Sheikh, Omair Latif, DO   237 mL at 02/07/19 1600  . folic acid (FOLVITE) tablet 1 mg  1 mg Oral Daily Tu, Ching T, DO   1 mg at 02/08/19 0945  . Gerhardt's butt cream 1 application  1 application Topical TID Tu, Ching T, DO   1 application at [id number]  . guaiFENesin (MUCINEX) 12 hr tablet 1,200 mg  1,200 mg Oral BID Raiford Noble [place], DO   1,200 mg at 02/08/19 0944  . ipratropium (ATROVENT) nebulizer solution 0.5 mg  0.5 mg Nebulization Q8H PRN Sheikh, Georgina Quint Latif, DO      . ketorolac (TORADOL) 15 MG/ML injection 15 mg  15 mg Intravenous Q8H PRN Raiford Noble [place], DO   15 mg at 02/08/19 0132  . latanoprost (XALATAN) 0.005 % ophthalmic solution 1 drop  1 drop Both Eyes QHS Tu, Ching T, DO   1 drop at 02/07/19 2337  . levalbuterol (XOPENEX) nebulizer solution 0.63 mg  0.63 mg Nebulization Q8H PRN Sheikh, Omair Latif, DO      . loperamide (IMODIUM) capsule 2 mg  2 mg Oral PRN Tu, Ching T, DO      . megestrol (MEGACE) 400 MG/10ML suspension 400 mg  400 mg Oral TID Tu, Ching T, DO   400 mg at 02/08/19 0944  . multivitamin with minerals tablet 1 tablet  1 tablet Oral Daily Raiford Noble [place], [place]   1 tablet at 02/08/19 [id number]  . nutrition supplement (JUVEN) (JUVEN) powder packet 1 packet  1 packet Oral BID BM Raiford Noble [place], DO   1 packet at 02/07/19 2040  . nystatin (MYCOSTATIN) 100000 UNIT/ML  suspension 500,000 Units  5 mL Oral QID Tu, Ching T, DO   500,000 Units at 02/08/19 0945  . nystatin cream (MYCOSTATIN)   Topical BID Maczis, Barth [place], PA-C      . pantoprazole (PROTONIX) EC tablet 20 mg  20 mg Oral Daily Tu, Ching T, DO   20 mg at 02/08/19 0944  . phosphorus (K PHOS NEUTRAL) tablet 500 mg  500 mg Oral Once Sheikh, Omair Latif, DO      . sertraline (ZOLOFT) tablet 25 mg  25 mg Oral Daily Raiford Noble [place], DO   25 mg at 02/08/19 0945  . traMADol (ULTRAM) tablet 50 mg  50 mg Oral Q8H PRN Kerney Elbe, DO        Musculoskeletal: Strength &  Muscle Tone: decreased Gait & Station: Did not witness Patient leans: N/A  Psychiatric Specialty Exam: Physical Exam  Nursing note and vitals reviewed. Constitutional: She appears well-developed.  HENT:  Head: Normocephalic.  Neck: Normal range of motion.  Respiratory: Effort normal.  Neurological: She is alert.  Psychiatric: Her speech is normal and behavior is normal. Judgment normal. Her affect is blunt. Cognition and memory are normal. She exhibits a depressed mood.    Review of Systems  Constitutional: Positive for malaise/fatigue.  Neurological: Positive for weakness.  Psychiatric/Behavioral: Positive for depression.  All other systems reviewed and are negative.   Blood pressure (!) 148/113, pulse 91, temperature 98.7 F (37.1 C), temperature source Oral, resp. rate 20, height 5\' 6"  (1.676 m), weight 47.6 kg, SpO2 94 %.Body mass index is 16.95 kg/m.  General Appearance: Casual  Eye Contact:  Good  Speech:  Slow  Volume:  Decreased  Mood:  Depressed  Affect:  Blunt  Thought Process:  Coherent  Orientation:  Other:  Person and place  Thought Content:  Logical  Suicidal Thoughts:  No  Homicidal Thoughts:  No  Memory:  Immediate;   Fair Recent;   Fair Remote;   Fair  Judgement:  Impaired  Insight:  Fair  Psychomotor Activity:  Decreased  Concentration:  Concentration: Fair and Attention Span: Fair   Recall:  [employer] of Knowledge:  Fair  Language:  Fair  Akathisia:  No  Handed:  Right  AIMS (if indicated):     Assets:  Leisure Time Resilience Social Support  ADL's:  Impaired  Cognition:  Impaired,  Mild  Sleep:        Treatment Plan Summary: Major depressive disorder, recurrent, mild: -Continue Zoloft 25 mg daily  Agitation- please discuss with daughter if this continues to be an issue  Disposition: No evidence of imminent risk to self or others at present.   Patient does not meet criteria for psychiatric inpatient admission.  Waylan Boga, NP 02/08/2019 12:02 PM

## 2019-02-09 LAB — GLUCOSE, CAPILLARY
Glucose-Capillary: 102 mg/dL — ABNORMAL HIGH (ref 70–99)
Glucose-Capillary: 118 mg/dL — ABNORMAL HIGH (ref 70–99)
Glucose-Capillary: 123 mg/dL — ABNORMAL HIGH (ref 70–99)
Glucose-Capillary: 67 mg/dL — ABNORMAL LOW (ref 70–99)
Glucose-Capillary: 75 mg/dL (ref 70–99)
Glucose-Capillary: 92 mg/dL (ref 70–99)
Glucose-Capillary: 94 mg/dL (ref 70–99)

## 2019-02-09 MED ORDER — KETOROLAC TROMETHAMINE 15 MG/ML IJ SOLN: 15.0000 mg | Freq: Four times a day (QID) | INTRAMUSCULAR | Status: DC

## 2019-02-09 MED ORDER — SODIUM CHLORIDE 0.9 % IV BOLUS
500.0000 mL | Freq: Once | INTRAVENOUS | Status: AC
  Administered 2019-02-09: 500 mL via INTRAVENOUS

## 2019-02-09 MED ORDER — CEPHALEXIN 500 MG PO CAPS
500.0000 mg | ORAL_CAPSULE | Freq: Two times a day (BID) | ORAL | Status: DC
  Administered 2019-02-09 – 2019-02-13 (×8): 500 mg via ORAL
  Filled 2019-02-09 (×9): qty 1

## 2019-02-09 MED ORDER — DEXTROSE 50 % IV SOLN
12.5000 g | INTRAVENOUS | Status: AC
  Administered 2019-02-09: 09:00:00 12.5 g via INTRAVENOUS
  Filled 2019-02-09: qty 50

## 2019-02-09 MED ORDER — MORPHINE SULFATE (PF) 2 MG/ML IV SOLN: 2.0000 mg | Freq: Once | INTRAVENOUS | Status: DC

## 2019-02-09 MED ORDER — KETOROLAC TROMETHAMINE 15 MG/ML IJ SOLN
15.0000 mg | Freq: Four times a day (QID) | INTRAMUSCULAR | Status: DC | PRN
  Administered 2019-02-09 – 2019-02-13 (×10): 15 mg via INTRAVENOUS
  Filled 2019-02-09 (×11): qty 1

## 2019-02-09 NOTE — Progress Notes (Signed)
[name] (pt's daughter) called RN for an update on the patient. [name] asked RN to pass along to Day shift that she would prefer no Narcotics be given to the patient. Night shift RN passed this information along to the Day shift RN during hand-off report.

## 2019-02-09 NOTE — Plan of Care (Signed)
  Problem: Clinical Measurements: Goal: Respiratory complications will improve Outcome: Progressing Goal: Cardiovascular complication will be avoided Outcome: Progressing   Problem: Elimination: Goal: Will not experience complications related to bowel motility Outcome: Progressing Goal: Will not experience complications related to urinary retention Outcome: Progressing   Problem: Pain Managment: Goal: General experience of comfort will improve Outcome: Progressing   Problem: Safety: Goal: Ability to remain free from injury will improve Outcome: Progressing   

## 2019-02-09 NOTE — Progress Notes (Signed)
PROGRESS NOTE                                                                                                                                                                                                             Patient Demographics:    Sydney [name], is a 85 y.o. female, DOB - 06-Jul-1930, FUX:[id number]  Admit date - 02/02/2019   Admitting Physician Orene Desanctis, DO  Outpatient Primary MD for the patient is [name], MD  LOS - 7    Chief Complaint  Patient presents with   Cough   Skin Ulcer       Brief Narrative   Please refer to hospitalist Dr. Teena Irani detailed summary from 10/13, in brief 85 year old female with C. difficile colitis status post transplant, hypertension, TIA, failure to thrive brought in by daughter with concern for sacral wound.  She had prolonged hospitalization from 8/24-10/6 for progressive generalized weakness, failure to thrive, weight loss.  Multiple subspecialties were involved in regards to her care.  Palliative care with goal for hospice was recommended but her daughter who is involved in her care has persistently refused. Patient had persistently poor appetite and daughter has consistently refused placement of NG tube or even a PEG tube.  Patient was discharged home after multiple discussion with the daughter that she would not show significant improvement.  Ethics committee was also involved during the previous hospital stay. She was discharged home with home health as daughter was resistant to SNF and hospice care.  Daughter brought her back to the hospital in 2 days reporting poor appetite and home health nurse concerned about her wheezing and sacral wound. During this hospitalization multiple subspecialties have been involved including surgery for her sacral wound (have started hydrotherapy), PCCM for pleural effusions (recommend this is likely from her vascular congestions).  Also  involved were ID for infected decubitus ulcer.  Neurology has seen patient previously for her critical illness myopathy.  Recommended for muscle biopsy but daughter had refused. Ethics committee was involved again.       Subjective:   Patient refusing meds, poor p.o. intake and also refused hydrotherapy today.   Assessment  & Plan :    Principal Problem:   Failure to thrive in adult Extensive work-up done previous hospitalization with multiple specialists involved.  Was discharged after extensive hospital stay  after being felt that patient had met maximum benefit along with involvement of ethics committee. Patient has severe deconditioning with suspicion for critical illness myopathy during last hospitalization.  Neurology had evaluated with consideration for muscle biopsy due to elevated ESR and CRP to rule out myositis but daughter refused.  CK and aldolase was normal.    Daughter has declined NG or PEG tube (she is not a candidate with any benefit).  Daughter has refused option for hospice as well. Patient intermittently tachycardic possibly with dehydration and severe deconditioning. Nutrition and PT following.  Seen by SLP and on dysphagia level 3 diet.  Requesting nursing involvement closely regarding her feeding. PT recommends home health upon discharge.  Continue Megace.  IV hydration as tolerated.  Acute respiratory failure with hypoxia Combination of volume overload with underlying diastolic dysfunction, severe malnutrition and possible intermittent aspiration. Continue flutter valve, incentive spirometry and antitussive. Dysphagia level 3 diet.  Pulmonary recommended comfort based approach (which daughter has been refusing) Continue supplemental oxygen.  Was given IV Flagyl with concern for aspiration as per ID recommendation and antibiotic stopped on 10/12. Continue I/O.  Sacral decubitus ulcer stage IV with abscess Appreciate surgery and wound care evaluation. Patient  continues to have discomfort over the sacral area and needs stronger pain medication however daughter refuses and only wants Tylenol. Was on empiric IV cefazolin, antibiotic switched to oral Keflex based on wound culture (GPC and gram variable rods). Started on hydrotherapy as per surgery recommendation.  Refused hydrotherapy today.  Surgery to reevaluate tomorrow if she agrees.  Hypokalemia Replenished  Hypophosphatemia Being replenished  Oral thrush Nystatin suspension  Normocytic anemia Stable.  Continue to monitor  Still C. difficile colitis surgical stool transplantation Noted to have increased frequency of loose stools past few days.  Antibiotic currently narrowed to oral Keflex.  Will discontinue PPI  Acute kidney injury Secondary to dehydration and infection.  Currently resolved.  Acute toxic metabolic encephalopathy Patient seems alert and awake although poorly communicative.  Does not need acute imaging at present  History of TIA Continue aspirin and Plavix   Severe protein calorie malnutrition Nutrition following.  Continue supplement and encourage p.o. intake.  Has extremely poor p.o. intake and continues to refuse food frequently.  Major depressive disorder, recurrent, mild Seen by psych and recommend to continue Zoloft  Diarrhea on 10/14 PPI discontinued.  Improved symptoms today  Code Status : Full code.  Discussed goals of care again with the daughter at bedside on 10/14.  Once her mother's medical condition to be restored back to her "normal"  Family Communication  : None at bedside today.  Daughter involved in care.  Disposition Plan  : Difficult effusion due to daughter's insistence to treatment options.  Will discuss with surgery regarding duration of hydrotherapy and plan on discharge home with home health  Barriers For Discharge : Active symptoms  Consults  : Surgery, ID, ethics  Procedures  : Hydrotherapy  DVT Prophylaxis  :  Lovenox -  Lab  Results  Component Value Date   PLT 194 02/08/2019    Antibiotics  :  Anti-infectives (From admission, onward)   Start     Dose/Rate Route Frequency Ordered Stop   02/09/19 1200  cephALEXin (KEFLEX) capsule 500 mg     500 mg Oral Every 12 hours 02/09/19 0952     02/06/19 1800  cephALEXin (KEFLEX) capsule 500 mg  Status:  Discontinued     500 mg Oral Every 6 hours 02/06/19 1500 02/09/19  3335   02/04/19 1400  metroNIDAZOLE (FLAGYL) IVPB 500 mg  Status:  Discontinued     500 mg 100 mL/hr over 60 Minutes Intravenous Every 8 hours 02/04/19 1343 02/07/19 1114   02/03/19 1500  ceFAZolin (ANCEF) IVPB 1 g/50 mL premix  Status:  Discontinued     1 g 100 mL/hr over 30 Minutes Intravenous Every 8 hours 02/03/19 1444 02/06/19 1500        Objective:   Vitals:   02/08/19 1331 02/08/19 2054 02/09/19 0521 02/09/19 0538  BP: 133/76 135/72 135/78   Pulse: 84 96 (!) 103   Resp: '16 20 20   ' Temp: 98.4 F (36.9 C) 98.1 F (36.7 C) 98 F (36.7 C)   TempSrc: Axillary Oral Oral   SpO2: 100% 100% 100%   Weight:    46.9 kg  Height:        Wt Readings from Last 3 Encounters:  02/09/19 46.9 kg  01/25/19 60.4 kg  12/16/18 45.5 kg     Intake/Output Summary (Last 24 hours) at 02/09/2019 1419 Last data filed at 02/09/2019 0520 Gross per 24 hour  Intake 1709.76 ml  Output 375 ml  Net 1334.76 ml    Physical exam Frail elderly female not in distress, poorly communicative HEENT: Temporal wasting, moist mucosa, supple neck Chest: Clear CVs: Normal S1-S2, no murmurs GI: Soft, nondistended or nontender, Foley in place, sacral decubitus ulcer Musculoskeletal: Warm, no edema      Data Review:    CBC Recent Labs  Lab 02/04/19 0540 02/05/19 0525 02/06/19 0459 02/07/19 0509 02/08/19 0543  WBC 23.8* 22.5* 15.1* 13.9* 11.2*  HGB 9.7* 8.9* 8.9* 8.9* 8.7*  HCT 29.7* 27.2* 27.5* 27.2* 27.0*  PLT 233 212 204 187 194  MCV 95.5 95.8 98.6 96.8 98.2  MCH 31.2 31.3 31.9 31.7 31.6  MCHC 32.7  32.7 32.4 32.7 32.2  RDW 17.5* 17.4* 17.5* 17.8* 18.4*  LYMPHSABS 0.6* 1.0 0.4* 0.6* 0.4*  MONOABS 1.0 1.1* 0.7 0.6 0.6  EOSABS 0.0 0.0 0.0 0.0 0.0  BASOSABS 0.0 0.0 0.0 0.0 0.0    Chemistries  Recent Labs  Lab 02/04/19 0540 02/05/19 0525 02/06/19 0459 02/07/19 0509 02/08/19 0543  NA 142 142 142 141 138  K 3.4* 3.3* 3.1* 3.0* 3.3*  CL 110 110 109 110 109  CO2 19* 19* '23 22 22  ' GLUCOSE 146* 95 100* 100* 111*  BUN '20 22 18 21 23  ' CREATININE 0.95 1.08* 0.80 0.83 0.99  CALCIUM 8.1* 8.0* 7.9* 7.8* 7.7*  MG 1.9 1.8 2.0 1.9 1.8  AST 35 35 '30 23 21  ' ALT '24 14 8 5 6  ' ALKPHOS 132* 122 112 111 123  BILITOT 1.0 1.1 1.0 0.9 1.1   ------------------------------------------------------------------------------------------------------------------ No results for input(s): CHOL, HDL, LDLCALC, TRIG, CHOLHDL, LDLDIRECT in the last 72 hours.  Lab Results  Component Value Date   HGBA1C 5.8 (H) 10/03/2017   ------------------------------------------------------------------------------------------------------------------ No results for input(s): TSH, T4TOTAL, T3FREE, THYROIDAB in the last 72 hours.  Invalid input(s): FREET3 ------------------------------------------------------------------------------------------------------------------ No results for input(s): VITAMINB12, FOLATE, FERRITIN, TIBC, IRON, RETICCTPCT in the last 72 hours.  Coagulation profile No results for input(s): INR, PROTIME in the last 168 hours.  No results for input(s): DDIMER in the last 72 hours.  Cardiac Enzymes No results for input(s): CKMB, TROPONINI, MYOGLOBIN in the last 168 hours.  Invalid input(s): CK ------------------------------------------------------------------------------------------------------------------ No results found for: BNP  Inpatient Medications  Scheduled Meds:  aspirin EC  81 mg Oral Daily   cephALEXin  500 mg Oral Q12H   Chlorhexidine Gluconate Cloth  6 each Topical Daily    clopidogrel  75 mg Oral Daily   collagenase   Topical Daily   feeding supplement (ENSURE ENLIVE)  237 mL Oral BID BM   folic acid  1 mg Oral Daily   Gerhardt's butt cream  1 application Topical TID   guaiFENesin  1,200 mg Oral BID   latanoprost  1 drop Both Eyes QHS   megestrol  400 mg Oral TID    morphine injection  2 mg Intravenous Once   multivitamin with minerals  1 tablet Oral Daily   nutrition supplement (JUVEN)  1 packet Oral BID BM   nystatin  5 mL Oral QID   nystatin cream   Topical BID   phosphorus  500 mg Oral Once   sertraline  25 mg Oral Daily   Continuous Infusions:  dextrose Stopped (02/09/19 0333)   PRN Meds:.acetaminophen **OR** acetaminophen, ipratropium, ketorolac, levalbuterol, loperamide, traMADol  Micro Results Recent Results (from the past 240 hour(s))  SARS CORONAVIRUS 2 (TAT 6-24 HRS) Nasopharyngeal Nasopharyngeal Swab     Status: None   Collection Time: 02/02/19  6:43 PM   Specimen: Nasopharyngeal Swab  Result Value Ref Range Status   SARS Coronavirus 2 NEGATIVE NEGATIVE Final    Comment: (NOTE) SARS-CoV-2 target nucleic acids are NOT DETECTED. The SARS-CoV-2 RNA is generally detectable in upper and lower respiratory specimens during the acute phase of infection. Negative results do not preclude SARS-CoV-2 infection, do not rule out co-infections with other pathogens, and should not be used as the sole basis for treatment or other patient management decisions. Negative results must be combined with clinical observations, patient history, and epidemiological information. The expected result is Negative. Fact Sheet for Patients: SugarRoll.be Fact Sheet for Healthcare Providers: https://www.woods-mathews.com/ This test is not yet approved or cleared by the [place] FDA and  has been authorized for detection and/or diagnosis of SARS-CoV-2 by FDA under an Emergency Use Authorization (EUA).  This EUA will remain  in effect (meaning this test can be used) for the duration of the COVID-19 declaration under Section 56 4(b)(1) of the Act, 21 U.S.C. section 360bbb-3(b)(1), unless the authorization is terminated or revoked sooner. Performed at [facility], [place] [address], [place], [place] [address]   Aerobic Culture (superficial specimen)     Status: None   Collection Time: 02/03/19 11:46 AM   Specimen: Sacral; Wound  Result Value Ref Range Status   Specimen Description   Final    SACRAL Performed at [facility] [address], [place], [place] [address]    Special Requests   Final    NONE Performed at [facility], [place] [address] [place], [place] [address]    Gram Stain   Final    RARE WBC PRESENT,BOTH PMN AND MONONUCLEAR ABUNDANT GRAM POSITIVE COCCI IN PAIRS ABUNDANT GRAM VARIABLE ROD Performed at [facility], [place] [address], [place], [place] [address]    Culture   Final    ABUNDANT STREPTOCOCCUS ANGINOSIS FEW STAPHYLOCOCCUS AUREUS    Report Status 02/06/2019 FINAL  Final   Organism ID, Bacteria STREPTOCOCCUS ANGINOSIS  Final   Organism ID, Bacteria STAPHYLOCOCCUS AUREUS  Final      Susceptibility   Staphylococcus aureus - MIC*    CIPROFLOXACIN <=0.5 SENSITIVE Sensitive     ERYTHROMYCIN <=0.25 SENSITIVE Sensitive     GENTAMICIN <=0.5 SENSITIVE Sensitive     OXACILLIN 0.5  SENSITIVE Sensitive     TETRACYCLINE <=1 SENSITIVE Sensitive     VANCOMYCIN <=0.5 SENSITIVE Sensitive     TRIMETH/SULFA <=10 SENSITIVE Sensitive     CLINDAMYCIN <=0.25 SENSITIVE Sensitive     RIFAMPIN <=0.5 SENSITIVE Sensitive     Inducible Clindamycin NEGATIVE Sensitive     * FEW STAPHYLOCOCCUS AUREUS   Streptococcus anginosis - MIC*    PENICILLIN <=0.06 SENSITIVE Sensitive     ERYTHROMYCIN <=0.12 SENSITIVE Sensitive     LEVOFLOXACIN 0.5 SENSITIVE Sensitive     VANCOMYCIN 0.5 SENSITIVE Sensitive     * ABUNDANT STREPTOCOCCUS ANGINOSIS     Radiology Reports Dg Abd 1 View  Result Date: 01/17/2019 CLINICAL DATA:  85 year old female with a history of enteric tube placement EXAM: ABDOMEN - 1 VIEW; DG NASO G TUBE PLC W/FL-NO RAD COMPARISON:  January 13, 2019 FINDINGS: Limited plain film demonstrates post pyloric tube terminating in the 3/4 portion of the duodenum. IMPRESSION: Post pyloric feeding tube terminates in the 3/4 portion the duodenum. Electronically Signed   By: Corrie Mckusick D.O.   On: 01/17/2019 15:55   Dg Abd 1 View  Result Date: 01/13/2019 CLINICAL DATA:  Blocked feeding tube. EXAM: ABDOMEN - 1 VIEW COMPARISON:  12/29/2018 FINDINGS: Enteric fusion tube is present with tip over the region of the distal stomach just right of midline in the mid abdomen. Bowel gas pattern is nonobstructive. Remainder of the exam is unchanged. IMPRESSION: Nonobstructive bowel gas pattern. Enteric feeding tube with tip right of midline in the mid abdomen likely over the distal stomach. Electronically Signed   By: Marin Olp M.D.   On: 01/13/2019 12:28   Ct Pelvis W Contrast  Result Date: 02/02/2019 CLINICAL DATA:  Sacral decubitus ulcer. EXAM: CT PELVIS WITH CONTRAST TECHNIQUE: Multidetector CT imaging of the pelvis was performed using the standard protocol following the bolus administration of intravenous contrast. CONTRAST:  89m OMNIPAQUE IOHEXOL 300 MG/ML  SOLN COMPARISON:  05/02/2018 FINDINGS: Urinary Tract:  No abnormality visualized. Bowel:  Unremarkable visualized pelvic bowel loops. Vascular/Lymphatic: Atheromatous arterial calcifications without aneurysm. No enlarged lymph nodes. Reproductive: Small uterus containing multiple small, densely calcified fibroids. No adnexal mass. Other: Small superficial soft tissue defect posteriorly to the left of the coccyx. This does not extend to the bone and there is no bone destruction or periosteal reaction. Slightly more inferiorly, there is a very small fluid and gas collection, measuring 1.0  x 0.4 cm on image number 35 series 2 and 1.9 cm in length on coronal image number 78. Also noted is diffuse subcutaneous edema. The previously demonstrated bilateral inguinal hernias are no longer seen. Musculoskeletal: No bone destruction or periosteal reaction. Lower lumbar spine degenerative changes. IMPRESSION: 1. Small soft tissue ulcer posteriorly to the left of the coccyx with a 1.9 x 1.0 x 0.4 cm abscess slightly more inferiorly. 2. No evidence of osteomyelitis. 3. Diffuse subcutaneous edema. Electronically Signed   By: SClaudie ReveringM.D.   On: 02/02/2019 17:13   Mr Cervical Spine Wo Contrast  Result Date: 01/29/2019 CLINICAL DATA:  85year old female with ataxia. Unexplained lower extremity weakness, spasticity. EXAM: MRI CERVICAL SPINE WITHOUT CONTRAST TECHNIQUE: Multiplanar, multisequence MR imaging of the cervical spine was performed. No intravenous contrast was administered. COMPARISON:  Brain MRI 12/21/2018.  Neck CT 10/13/2017. FINDINGS: Alignment: Preserved cervical lordosis similar to the 2019 neck CT. No spondylolisthesis. Vertebrae: No marrow edema or evidence of acute osseous abnormality. Visualized bone marrow signal is within normal limits. Cord: Suboptimal spinal cord detail  due to motion artifact despite repeated imaging attempts, but no convincing cervical or upper thoracic spinal cord signal abnormality. Fairly capacious spinal canal. Posterior Fossa, vertebral arteries, paraspinal tissues: Stable cervicomedullary junction and visible posterior fossa. Preserved major vascular flow voids in the neck, the left vertebral artery appears mildly dominant. Disc levels: Mild for age cervical spine degeneration with no cervical or upper thoracic spinal stenosis. There is i generally mild cervical neural foraminal stenosis despite some levels of facet hypertrophy. There is up to moderate stenosis at the left C4 nerve level. IMPRESSION: Essentially negative for age MRI appearance of the cervical  spine. No spinal stenosis, and no cord signal abnormality identified although cord detail is suboptimal due to motion. Electronically Signed   By: Genevie Ann M.D.   On: 01/29/2019 15:12   Mr Thoracic Spine Wo Contrast  Result Date: 01/29/2019 CLINICAL DATA:  85 year old female with ataxia. Unexplained lower extremity weakness, spasticity. EXAM: MRI THORACIC SPINE WITHOUT CONTRAST TECHNIQUE: Multiplanar, multisequence MR imaging of the thoracic spine was performed. No intravenous contrast was administered. COMPARISON:  Cervical spine MRI today reported separately. FINDINGS: The examination had to be discontinued prior to completion due to patient pain. Only 1 set of axial images was obtained, and the study is intermittently degraded by motion. Limited cervical spine imaging:  Reported separately today. Thoracic spine segmentation:  Appears to be normal. Alignment: Mildly exaggerated thoracic kyphosis. No spondylolisthesis. Vertebrae: No marrow edema or evidence of acute osseous abnormality. Visualized bone marrow signal is within normal limits. Cord: No definite thoracic spinal cord signal abnormality is identified. The conus appears normal at T11-T12. There is questionable volume loss of the thoracic cord, somewhat generalized (e.g. So at the T2-T3 level series 22, image 8, and again at the T8-T9 level series 22, image 25). As in the cervical spine, the spinal canal is capacious. Paraspinal and other soft tissues: Small to moderate layering left left greater than right pleural effusions. Posterior paraspinal soft tissues appear within normal limits. Disc levels: Mild for age thoracic spine degeneration similar to that in the cervical spine. No spinal or foraminal stenosis. IMPRESSION: 1. Questionable generalized thoracic spinal cord volume loss, but no definite focal cord signal abnormality. Cord detail is suboptimal due to motion. 2. Mild for age thoracic spine degeneration and capacious spinal canal. 3. Left  greater than right layering pleural effusions. Electronically Signed   By: Genevie Ann M.D.   On: 01/29/2019 15:25   US Renal  Result Date: 02/03/2019 CLINICAL DATA:  Urinary retention EXAM: RENAL / URINARY TRACT ULTRASOUND COMPLETE COMPARISON:  CT dated 01/06/2019 FINDINGS: Right Kidney: Renal measurements: 8.8 x 4.2 x 3.4 cm = volume: 64 mL. There is mild hydronephrosis. Left Kidney: Renal measurements: 8.8 x 3.9 x 2.7 cm = volume: 48.7 mL. There is mild hydronephrosis. Bladder: Both ureteral jets were noted. There is free fluid in the patient's pelvis. IMPRESSION: 1. Mild bilateral hydronephrosis. 2. Both ureteral jets were visualized. 3. Free fluid in the pelvis. Electronically Signed   By: Constance Holster M.D.   On: 02/03/2019 18:15   Mr Femur Left Wo Contrast  Result Date: 01/30/2019 CLINICAL DATA:  Myelopathy, acute or progressive. Evaluate for myositis. EXAM: MR OF THE LEFT FEMUR WITHOUT CONTRAST TECHNIQUE: Coronal T1 and inversion recovery imaging of both thighs was performed. The patient refused additional imaging. No intravenous contrast was administered. COMPARISON:  Pelvic CT 01/06/2019. FINDINGS: Bones/Joint/Cartilage There is no evidence of acute fracture, dislocation or femoral head avascular necrosis. There are mild  degenerative changes of both hips, similar to previous CT. Mild degenerative changes are also present in both knees, greater on the right. No large joint effusions. Ligaments Not relevant for examination/indication. Muscles and Tendons There is generalized soft tissue edema with mild T2 hyperintensity throughout the subcutaneous fat and muscles of both thighs. No focal fluid collection or muscular atrophy identified. No gross tendon abnormalities on coronal imaging. Soft tissues As above, generalized soft tissue edema throughout both thighs. No focal fluid collections are identified. IMPRESSION: 1. Limited study, consisting of only coronal imaging of both thighs. 2. Nonspecific  generalized soft tissue edema involving the muscles and subcutaneous fat of both thighs. Cannot exclude myositis. No focal fluid collection or muscular atrophy identified. 3. No acute osseous findings. Mild degenerative changes of both hips and knees. Electronically Signed   By: Richardean Sale M.D.   On: 01/30/2019 14:24   Dg Chest Port 1 View  Result Date: 02/07/2019 CLINICAL DATA:  Shortness of breath. Rhonchi. EXAM: PORTABLE CHEST 1 VIEW COMPARISON:  Chest x-ray 02/04/2019 and CT scan of the abdomen dated 01/06/2019 FINDINGS: Stable borderline cardiomegaly. Persistent consolidation/atelectasis/effusion at the left lung base. Clearing of the faint left perihilar infiltrate. Small right pleural effusion is slightly more prominent. The hazy infiltrate in the right upper lobe is slightly more extensive but less dense and the right perihilar infiltrate has almost resolved. Aortic atherosclerosis.  No acute bone abnormality. IMPRESSION: 1. Persistent consolidation/atelectasis at the left lung base. 2. Clearing of the left perihilar infiltrate. 3. Slightly more extensive but less dense infiltrate in the right upper lobe. 4. Small right pleural effusion, slightly increased. 5.  Aortic Atherosclerosis (ICD10-I70.0). Electronically Signed   By: Lorriane Shire M.D.   On: 02/07/2019 15:31   Dg Chest Port 1 View  Result Date: 02/04/2019 CLINICAL DATA:  Shortness of breath this morning. EXAM: PORTABLE CHEST 1 VIEW COMPARISON:  02/03/2019 FINDINGS: Stable enlarged cardiac silhouette and dense left lower lobe airspace opacity. No significant change in overall amount of patchy opacity in the right upper lobe and both perihilar regions. Stable small right pleural effusion and probable small left pleural effusion. Diffuse osteopenia. IMPRESSION: 1. No significant change in dense left lower lobe atelectasis or pneumonia. 2. No significant change in probable pneumonia in the right upper lobe and both perihilar regions.  Alveolar pulmonary edema is less likely in the absence of pulmonary vascular congestion and interstitial pulmonary edema. 3. Stable small right pleural effusion and probable small left pleural effusion. 4. Stable cardiomegaly. Electronically Signed   By: Claudie Revering M.D.   On: 02/04/2019 11:28   Dg Chest Port 1 View  Result Date: 02/03/2019 CLINICAL DATA:  Wheezing. EXAM: PORTABLE CHEST 1 VIEW COMPARISON:  February 02, 2019 FINDINGS: Enlarged cardiac silhouette. Calcific atherosclerotic disease of the aorta. Bilateral moderate in size pleural effusions. Worsening aeration of the lungs with focal airspace opacity in the right upper lobe. Mild pulmonary edema. Osseous structures are without acute abnormality. Soft tissues are grossly normal. IMPRESSION: 1. Bilateral moderate in size pleural effusions. 2. Worsening aeration of the lungs with focal airspace opacity in the right upper lobe. 3. Mild pulmonary edema. 4. Calcific atherosclerotic disease of the aorta. Electronically Signed   By: Fidela Salisbury M.D.   On: 02/03/2019 17:38   Dg Chest Port 1 View  Result Date: 02/02/2019 CLINICAL DATA:  Cough and tachypnea with tachycardia. EXAM: PORTABLE CHEST 1 VIEW COMPARISON:  01/23/2019 FINDINGS: Patient slightly rotated to the left. Lungs are adequately inflated demonstrate  hazy bilateral perihilar opacification likely mild interstitial edema. Suggestion of small amount of bilateral pleural fluid. Left effusion improved compared to previous exams. Mild stable cardiomegaly. Remainder of the exam is unchanged. IMPRESSION: Evidence of mild interstitial edema and small bilateral pleural effusions with significant interval improvement in the left effusion. Central lung infection is possible but less likely. Mild stable cardiomegaly. Electronically Signed   By: Marin Olp M.D.   On: 02/02/2019 16:04   Dg Chest Port 1 View  Result Date: 01/23/2019 CLINICAL DATA:  Cough. EXAM: PORTABLE CHEST 1 VIEW COMPARISON:   Radiograph 01/13/2019 FINDINGS: Enteric tube has been removed. Lower lung volumes from prior. Patient is rotated. Cardiomegaly is more prominent than on prior exam. Unchanged mediastinal contours with aortic atherosclerosis. Moderate bilateral pleural effusions and associated bibasilar opacities, left greater than right. No evidence of pulmonary edema. No confluent airspace disease. No pneumothorax. IMPRESSION: 1. Moderate bilateral pleural effusions with associated bibasilar opacities, left greater than right, likely atelectasis. 2. Cardiomegaly is more prominent than on prior exam. Aortic Atherosclerosis (ICD10-I70.0). Electronically Signed   By: Keith Rake M.D.   On: 01/23/2019 19:30   Dg Chest Port 1 View  Result Date: 01/13/2019 CLINICAL DATA:  Patient is spitting up 2 feeds. Possible aspiration. EXAM: PORTABLE CHEST 1 VIEW COMPARISON:  Single-view of the chest 12/16/2018. FINDINGS: Feeding tube courses into the stomach and below the inferior margin of the film. The patient has moderate bilateral pleural effusions. Mild basilar atelectasis noted. There is cardiomegaly without edema. No pneumothorax. No acute or focal bony abnormality. IMPRESSION: Moderate layering pleural effusions with mild bibasilar atelectasis. Cardiomegaly without edema. Electronically Signed   By: Inge Rise M.D.   On: 01/13/2019 09:47   Dg Addison Bailey G Tube Plc W/fl-no Rad  Result Date: 01/17/2019 CLINICAL DATA:  85 year old female with a history of enteric tube placement EXAM: ABDOMEN - 1 VIEW; DG NASO G TUBE PLC W/FL-NO RAD COMPARISON:  January 13, 2019 FINDINGS: Limited plain film demonstrates post pyloric tube terminating in the 3/4 portion of the duodenum. IMPRESSION: Post pyloric feeding tube terminates in the 3/4 portion the duodenum. Electronically Signed   By: Corrie Mckusick D.O.   On: 01/17/2019 15:55   US Liver Doppler  Result Date: 01/13/2019 CLINICAL DATA:  85 year old female with elevated liver enzymes.  Evaluate for portal vein stenosis/occlusion. EXAM: DUPLEX ULTRASOUND OF LIVER TECHNIQUE: Color and duplex Doppler ultrasound was performed to evaluate the hepatic in-flow and out-flow vessels. COMPARISON:  Prior CT abdomen/pelvis 01/06/2019 FINDINGS: Liver: Normal parenchymal echogenicity. Normal hepatic contour without nodularity. No focal lesion, mass or intrahepatic biliary ductal dilatation. Portal Vein Velocities Main:  35 cm/sec with normal hepatopetal flow. Right:  23 cm/sec with normal hepatopetal flow. Left:  27 cm/sec with normal hepatopetal flow. Hepatic Vein Velocities Right:  16 cm/sec Middle:  27 cm/sec Left:  23 cm/sec IVC: Present and patent with normal respiratory phasicity. Hepatic Artery Velocity:  102 cm/sec Splenic Vein Velocity:  31 cm/sec Varices: Absent Ascites: Small volume ascites. Other: Bilateral pleural effusions are noted incidentally. IMPRESSION: 1. Patent and normal appearing portal and hepatic veins. 2. Small volume ascites. 3. Bilateral moderate pleural effusions. 4. Normal sonographic appearance of the liver. Electronically Signed   By: Jacqulynn Cadet M.D.   On: 01/13/2019 16:00    Time Spent in minutes  25   Nishtha Raider M.D on 02/09/2019 at 2:19 PM  Between 7am to 7pm - Pager - [phone]  After 7pm go to [email or web address] - password [facility]  [facility] -  Office  [phone]

## 2019-02-09 NOTE — Evaluation (Signed)
Clinical/Bedside Swallow Evaluation Patient Details  Name: Sydney [name] MRN: [id number] Date of Birth: May 27, 1930  Today's Date: 02/09/2019 Time: SLP Start Time (ACUTE ONLY): 0759 SLP Stop Time (ACUTE ONLY): 0821 SLP Time Calculation (min) (ACUTE ONLY): 22 min  Past Medical History:  Past Medical History:  Diagnosis Date  . Anemia    years ago  . Aortic atherosclerosis ([place])   . Arthritis    "left hand" (02/17/2013)  . Bilateral inguinal hernia    INCIDENTAL CT 1/20  . C. difficile diarrhea   . Cataract   . Dehydration 12/29/2018  . Diplopia    CRANIAL 6 NERVE PALSY  . Dizzy   . Dyslipidemia   . Gastritis   . Glaucoma   . High cholesterol    "on RX years ago" (02/17/2013)  . Hypertension   . Leukopenia   . Meningioma ([place]) 05/01/2015  . Osteoporosis 02/2014   T score -2.5  followed by Dr. Lysle Rubens  . Palpitations    PACs, PVCs and short runs of atrial tachycardia on Holter monitoring  . PVD (peripheral vascular disease) ([place])   . Rhinitis   . Syncopal episodes 2003  . TIA (transient ischemic attack) 1990's  . Trigger thumb of left hand   . Weakness 12/29/2018  . Winter itch    Past Surgical History:  Past Surgical History:  Procedure Laterality Date  . ANAL FISTULECTOMY  1970  . COLONOSCOPY    . COLONOSCOPY WITH PROPOFOL N/A 12/02/2016   Procedure: COLONOSCOPY WITH PROPOFOL;  Surgeon: Gatha Mayer, MD;  Location: [facility] ENDOSCOPY;  Service: Endoscopy;  Laterality: N/A;  . FECAL TRANSPLANT N/A 12/02/2016   Procedure: FECAL TRANSPLANT;  Surgeon: Gatha Mayer, MD;  Location: [facility] ENDOSCOPY;  Service: Endoscopy;  Laterality: N/A;  . 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  She has had several recent hospital admissions for FTT and a sacral wound.  CXR during this admit showed infiltrate.  Ethics consulted with plan for  treatment and pt  remains full code.  No PEG planned and pt on a dys3/thin diet.   Assessment / Plan / Recommendation Clinical Impression  Pt did not follow directions for oral motor exam but her voice was clear without indication of secretion retention.  She did have dentures (SLP could only view upper dentition).  When advised to her low blood sugar, she accepted a single bolus of orange juice.  Clinical indication of oral holding/delayed swallow but no cough or indication of airway compromise.  Pt refused all other offering of po including pancakes with syrup and juice - with pt stating "wait a minute" and "I need to get up and dressed".  Pt's daughter has extensively been educated to dysphagia mitigation strategies for this pt. SLP recommends to continue offering dys3/thin diet for maximal intake but she will likely not meet nutritional needs.  SlP posted swallow precaution sign and will sign off. 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  She has had several recent hospital admissions for FTT and a sacral wound.  CXR during this admit showed infiltrate.  Ethics consulted with plan for treatment and pt  remains full code.  No PEG planned and pt on a dys3/thin diet. Type of Study: Bedside Swallow  Evaluation Previous Swallow Assessment: BSE 8/27 Diet Prior to this Study: Dysphagia 3 (soft);Thin liquids Temperature Spikes Noted: No Respiratory Status: Room air History of Recent Intubation: No Behavior/Cognition: Alert;Uncooperative;Doesn't follow directions;Impulsive Oral Cavity Assessment: Within Functional Limits Oral Care Completed by SLP: No Oral Cavity - Dentition: Dentures, bottom;Dentures, top Vision: (pt able to hold her own cup) Patient Positioning: Postural control adequate for testing Baseline Vocal Quality: Normal Volitional Cough: Cognitively unable to elicit Volitional Swallow: Unable to elicit    Oral/Motor/Sensory Function     Ice Chips Ice chips: Not tested Other Comments: Would not accept bolus; Withdrew from thermal stimulation to lips   Thin Liquid Thin Liquid: Impaired Presentation: Straw Oral Phase Functional Implications: Oral holding Other Comments: suspect oral holding but no indications of aspiration; pt repeated stated "wait a minute" when SlP offered further po and declined all offerings    Nectar Thick Nectar Thick Liquid: Not tested   Honey Thick Honey Thick Liquid: Not tested   Puree Puree: Not tested   Solid     Other Comments: pt would not accept solid bolus *bite of pancake with syrup* despite multiple offerings      [name] 02/09/2019,9:36 AM    Luanna Salk, MS [place] Pager [phone] Office [phone]

## 2019-02-09 NOTE — Progress Notes (Signed)
Patient verbal today and refuses hydrotherapy or any treatment to her wound at this time.  Will reattempt wound check tomorrow if patient agreeable.  Sydney [name] 1:57 PM 02/09/2019

## 2019-02-09 NOTE — Progress Notes (Signed)
Pt refusing megace and nystatin mid day and afternoon. Also refusing tylenol for pain. Offered to ask for stronger pain medication from MD, but daughter strongly refused.

## 2019-02-09 NOTE — Progress Notes (Signed)
PHARMACY NOTE:  ANTIMICROBIAL RENAL DOSAGE ADJUSTMENT  Current antimicrobial regimen includes a mismatch between antimicrobial dosage and estimated renal function.  As per policy approved by the Pharmacy & Therapeutics and Medical Executive Committees, the antimicrobial dosage will be adjusted accordingly.  Current antimicrobial dosage:  Cephalexin 500mg  PO QID  Indication: sacral decubitis  Renal Function:  Estimated Creatinine Clearance: 30.2 mL/min (by C-G formula based on SCr of 0.99 mg/dL). []      On intermittent HD, scheduled: []      On CRRT    Antimicrobial dosage has been changed to:  Cephalexin 500mg  BID  Additional comments: she is not reliably accepting/taking cephalexin, at most taking 2 doses/day   Thank you for allowing pharmacy to be a part of this patient's care.  Doreene Eland, PharmD, BCPS.   Work Cell: [phone] 02/09/2019 9:54 AM

## 2019-02-09 NOTE — Progress Notes (Signed)
PT Cancellation Note and Hydro therapy note  Patient Details Name: Sydney [name] MRN: [id number] DOB: [birth year]   Cancelled Treatment:     Pt responding to questions and treatment today verbally ( unlike other days). Pt refusing hydrotherapy, dressing change and any movement at this time, she made it very clear, "not today".  Will reattempt mobility and hydrotherapy tomorrow if patient allows.   Clide Dales 02/09/2019, 6:49 PM  Clide Dales, PT Acute Rehabilitation Services Pager: [phone] Office: [phone] 02/09/2019

## 2019-02-09 NOTE — Progress Notes (Addendum)
Pt's output from 1927-0300 was 136mL, amber in color. RN notified Lamar Blinks, NP. RN will continue to monitor.   Orders received: 593mL NS Bolus

## 2019-02-09 NOTE — Progress Notes (Signed)
Pt has refused all PO medications tonight, including her PO antibiotic. RN educated the patient and made 3 more attempts. Patient continues to tighten her lips and refuse to take medication.  RN notified Lamar Blinks, NP.

## 2019-02-10 LAB — BASIC METABOLIC PANEL
Anion gap: 6 (ref 5–15)
BUN: 21 mg/dL (ref 8–23)
CO2: 23 mmol/L (ref 22–32)
Calcium: 7.8 mg/dL — ABNORMAL LOW (ref 8.9–10.3)
Chloride: 108 mmol/L (ref 98–111)
Creatinine, Ser: 0.76 mg/dL (ref 0.44–1.00)
GFR calc Af Amer: >60 mL/min (ref >60)
GFR calc non Af Amer: >60 mL/min (ref >60)
Glucose, Bld: 91 mg/dL (ref 70–99)
Potassium: 3.1 mmol/L — ABNORMAL LOW (ref 3.5–5.1)
Sodium: 137 mmol/L (ref 135–145)

## 2019-02-10 LAB — CBC
HCT: 26 % — ABNORMAL LOW (ref 36.0–46.0)
Hemoglobin: 8.3 g/dL — ABNORMAL LOW (ref 12.0–15.0)
MCH: 32 pg (ref 26.0–34.0)
MCHC: 31.9 g/dL (ref 30.0–36.0)
MCV: 100.4 fL — ABNORMAL HIGH (ref 80.0–100.0)
Platelets: 176 10*3/uL (ref 150–400)
RBC: 2.59 MIL/uL — ABNORMAL LOW (ref 3.87–5.11)
RDW: 18.9 % — ABNORMAL HIGH (ref 11.5–15.5)
WBC: 9.6 10*3/uL (ref 4.0–10.5)
nRBC: 0 % (ref 0.0–0.2)

## 2019-02-10 LAB — GLUCOSE, CAPILLARY
Glucose-Capillary: 82 mg/dL (ref 70–99)
Glucose-Capillary: 87 mg/dL (ref 70–99)
Glucose-Capillary: 96 mg/dL (ref 70–99)
Glucose-Capillary: 98 mg/dL (ref 70–99)

## 2019-02-10 NOTE — Plan of Care (Signed)
  Problem: Education: Goal: Knowledge of General Education information will improve Description: Including pain rating scale, medication(s)/side effects and non-pharmacologic comfort measures Outcome: Progressing   Problem: Health Behavior/Discharge Planning: Goal: Ability to manage health-related needs will improve Outcome: Progressing   Problem: Clinical Measurements: Goal: Will remain free from infection Outcome: Progressing   Problem: Activity: Goal: Risk for activity intolerance will decrease Outcome: Progressing   Problem: Pain Managment: Goal: General experience of comfort will improve Outcome: Progressing   Problem: Nutrition: Goal: Adequate nutrition will be maintained Outcome: Not Progressing

## 2019-02-10 NOTE — Progress Notes (Signed)
Patient ID: Sydney [name], female   DOB: 11/29/1930, 85 y.o.   MRN: [id number]       Subjective: Patient feels better today but having pain at her backside.  Objective: Vital signs in last 24 hours: Temp:  [98.6 F (37 C)-98.8 F (37.1 C)] 98.6 F (37 C) (10/16 0512) Pulse Rate:  [89-125] 92 (10/16 1128) Resp:  [16-20] 20 (10/16 0512) BP: (134-140)/(75-83) 140/83 (10/16 0512) SpO2:  [95 %-100 %] 100 % (10/16 0512) Last BM Date: 02/09/19  Intake/Output from previous day: 10/15 0701 - 10/16 0700 In: 733.2 [I.V.:733.2] Out: 370 [Urine:370] Intake/Output this shift: Total I/O In: 119.9 [I.V.:119.9] Out: -   PE: Skin: wound has cleaned up significantly and is mostly pink healthy tissue.  Her sacral bone is palpable at the central portion of her wound.  There is undermining of the wound, but this has cleaned up as well.    Lab Results:  Recent Labs    02/08/19 0543 02/10/19 0813  WBC 11.2* 9.6  HGB 8.7* 8.3*  HCT 27.0* 26.0*  PLT 194 176   BMET Recent Labs    02/08/19 0543 02/10/19 0813  NA 138 137  K 3.3* 3.1*  CL 109 108  CO2 22 23  GLUCOSE 111* 91  BUN 23 21  CREATININE 0.99 0.76  CALCIUM 7.7* 7.8*   PT/INR No results for input(s): LABPROT, INR in the last 72 hours. CMP     Component Value Date/Time   NA 137 02/10/2019 0813   K 3.1 (L) 02/10/2019 0813   CL 108 02/10/2019 0813   CO2 23 02/10/2019 0813   GLUCOSE 91 02/10/2019 0813   BUN 21 02/10/2019 0813   CREATININE 0.76 02/10/2019 0813   CREATININE 0.67 01/03/2014 1111   CALCIUM 7.8 (L) 02/10/2019 0813   PROT 5.8 (L) 02/08/2019 0543   ALBUMIN 2.1 (L) 02/08/2019 0543   AST 21 02/08/2019 0543   ALT 6 02/08/2019 0543   ALKPHOS 123 02/08/2019 0543   BILITOT 1.1 02/08/2019 0543   GFRNONAA >60 02/10/2019 0813   GFRAA >60 02/10/2019 0813   Lipase     Component Value Date/Time   LIPASE 123 (H) 12/20/2018 0012       Studies/Results: No results found.  Anti-infectives: Anti-infectives  (From admission, onward)   Start     Dose/Rate Route Frequency Ordered Stop   02/09/19 1200  cephALEXin (KEFLEX) capsule 500 mg     500 mg Oral Every 12 hours 02/09/19 0952     02/06/19 1800  cephALEXin (KEFLEX) capsule 500 mg  Status:  Discontinued     500 mg Oral Every 6 hours 02/06/19 1500 02/09/19 0952   02/04/19 1400  metroNIDAZOLE (FLAGYL) IVPB 500 mg  Status:  Discontinued     500 mg 100 mL/hr over 60 Minutes Intravenous Every 8 hours 02/04/19 1343 02/07/19 1114   02/03/19 1500  ceFAZolin (ANCEF) IVPB 1 g/50 mL premix  Status:  Discontinued     1 g 100 mL/hr over 30 Minutes Intravenous Every 8 hours 02/03/19 1444 02/06/19 1500       Assessment/Plan FTT SPCM  Stage 4 sacral decubitus ulcer -has cleaned up significantly since Monday with the assistance of hydrotherapy and santyl.  PT hydrotherapy has done a wonderful job with this patient! -patient is surgically stable for DC home with [facility] for dressing changes -she will need to follow up with the wound center as an outpatient for further wound care. -surgery will sign off.  Discussed case with  Dr. Clementeen Graham   LOS: 8 days    Henreitta Cea , [facility] Surgery 02/10/2019, 1:37 PM Please see Amion for pager number during day hours 7:00am-4:30pm

## 2019-02-10 NOTE — Progress Notes (Signed)
Pt's daughter Sydney [name] called RN for an update on the pt.

## 2019-02-10 NOTE — Progress Notes (Signed)
PT Hydrotherapy Note   Wound Therapy - Clinical Statement Pt's wound opening has increased slightly in size due to the larger opening merged with the smaller 2 inferior opening from earlier this week. However  this allows great visibility of the enter wound bed , and has allowed for greater debribement and dressing changes with santyl and packing. Pt still with great pain with mobilit rolling and postioning from one side to another, however tolerates session fairly well with premedication of mild pain medicine. Pt with greatly improved granulation tissue , less bioburden of necrotic tissue as well. Educated daughter with beginning basics of dressing changes, and position of her mother. Had her on the side of dressing change to watch and learn what I was doing today. She will need further education for dressing changes, as well as how to assist with cleaning , bathing, and mobilizing in bed her mother. Nursing aware to help educate and include her in care some moving forward.   Was in with surgery at bedside as well, and they feel great improvement and no further hydrotherapy needed at this time , just BID dressing changes by nursing with education to with the daughter.   Will sigh off for Hydro therapy at this time.          02/10/19 1700  Subjective Assessment  Subjective Pt was still more alert today talking and corresponding to our questions. Pt still prefers to not be moved much.   Patient and Family Stated Goals per dtr" mama you need this so it can heal and we can go home"   Pressure Injury 02/02/19 Buttocks Left Unstageable - Full thickness tissue loss in which the base of the ulcer is covered by slough (yellow, tan, gray, green or brown) and/or eschar (tan, brown or black) in the wound bed.  Date First Assessed/Time First Assessed: 02/02/19 2213   Location: Buttocks  Location Orientation: Left  Staging: Unstageable - Full thickness tissue loss in which the base of the ulcer is  covered by slough (yellow, tan, gray, green or brown) and/or e...  Dressing Type Moist to dry;Other (Comment);Foam - Lift dressing to assess site every shift  Dressing Changed  Dressing Change Frequency Twice a day  State of Healing Non-healing  Site / Wound Assessment Granulation tissue;Painful;Pink;Yellow  % Wound base Red or Granulating 50%  % Wound base Other/Granulation Tissue (Comment) 50%  Wound Length (cm) 4 cm  Wound Width (cm) 4 cm  Wound Surface Area (cm^2) 16 cm^2  Undermining (cm) 2-3 (9:00-11:00 with greatest length )  Margins Unattached edges (unapproximated)  Drainage Amount Minimal  Drainage Description Purulent  Treatment Debridement (Selective);Hydrotherapy (Pulse lavage);Packing (Saline gauze) (with santyl)  Hydrotherapy  Pulsed lavage therapy - wound location sacrum   Pulsed Lavage with Suction (psi) 8 psi  Pulsed Lavage with Suction - Normal Saline Used 1000 mL  Pulsed Lavage Tip Tip with splash shield  Selective Debridement  Selective Debridement - Location sacrum   Selective Debridement - Tools Used Forceps;Scissors  Selective Debridement - Tissue Removed stringy sloth  Wound Therapy - Assess/Plan/Recommendations     Wound Therapy - Functional Problem List decreasaed mobility and nutrition   Factors Delaying/Impairing Wound Healing Immobility;Incontinence;Multiple medical problems (poor intake / nutirition )  Hydrotherapy Plan Other (comment) (in today with surgery and their plan is the DC hydrotherapy )  Wound Therapy Goals - Improve the function of patient's integumentary system by progressing the wound(s) through the phases of wound healing by:  Decrease Necrotic Tissue to 50  Decrease Necrotic Tissue - Progress Met  Increase Granulation Tissue to 50  Increase Granulation Tissue - Progress Met  Improve Drainage Characteristics Min  Improve Drainage Characteristics - Progress Met  Patient/Family will be able to  help position patient for relief on  sacrum area   Patient/Family Instruction Goal - Progress Progressing toward goal (continue this with nursing tonight and tomorrow )   Sydney [name], [place] Pager: [phone] Office: [phone] 02/10/2019

## 2019-02-10 NOTE — Progress Notes (Signed)
OT Cancellation Note  Patient Details Name: Sydney [name] MRN: [id number] DOB: June 07, 1930   Cancelled Treatment:    Reason Eval/Treat Not Completed: Pain limiting ability to participate;Fatigue/lethargy limiting ability to participate When OT presented for tx, pt's daughter and CNA reporting that nursing just got pt repositioned comfortably relative to sore sacral area. In addition, pt states "let me rest awhile." Will f/u for therapy on future date as tolerable for pt.    Sharren Bridge  Pager [phone] 02/10/2019, 4:10 PM

## 2019-02-10 NOTE — Plan of Care (Signed)
  Problem: Nutrition: Goal: Adequate nutrition will be maintained Outcome: Progressing   Problem: Coping: Goal: Level of anxiety will decrease Outcome: Progressing   Problem: Pain Managment: Goal: General experience of comfort will improve Outcome: Progressing   

## 2019-02-10 NOTE — Progress Notes (Signed)
Encouraged daughter to watch/assist with dressing change and oral care for patient since she is primary caregiver at home. Daughter strongly refused to learn and said it "wasn't her job".

## 2019-02-10 NOTE — TOC Progression Note (Signed)
Transition of Care [facility]) - Progression Note    Patient Details  Name: Sydney [name] MRN: [id number] Date of Birth: [birth year]  [facility] CM/SW [place], LCSW Phone Number: 02/10/2019, 12:30 PM  Clinical Narrative:   CSW spoke with patients [name] outside of patients room per her request. [name] stated she would like resources on private duty nursing so patient could have someone in the home. CSW gave [name] information and encouraged her to reach out to different agency. [name] aware that once patient is medically stable that [facility] will be following patient for HHPT and Nursing. [name] requested that nursing comes out first before PT does. CSW stated she will reach out to agency and make them aware of preference      Expected Discharge Plan: [place] Barriers to Discharge: Continued Medical Work up  Expected Discharge Plan and Services Expected Discharge Plan: [place] In-house Referral: Clinical Social Work Discharge Planning Services: CM Consult Post Acute Care Choice: Durable Medical Equipment, Home Health Living arrangements for the past 2 months: Single Family Home                           HH Arranged: RN, PT, Nurse's Aide [place] Agency: [place] ([facility]) Date HH Agency Contacted: 02/02/19 Time [place]: 1448 Representative spoke with at [place]: [place]) Interventions    Readmission Risk Interventions Readmission Risk Prevention Plan 01/30/2019  Transportation Screening Complete  Medication Review Press photographer) Complete  PCP or Specialist appointment within 3-5 days of discharge Complete  HRI or [place] Complete  SW Recovery Care/Counseling Consult Complete  Palliative Care Screening Patient [place] Patient Refused  Some recent data might be hidden

## 2019-02-10 NOTE — Progress Notes (Addendum)
PROGRESS NOTE                                                                                                                                                                                                             Patient Demographics:    Sydney [name], is a 85 y.o. female, DOB - 24-Dec-1930, DJM:[id number]  Admit date - 02/02/2019   Admitting Physician Orene Desanctis, DO  Outpatient Primary MD for the patient is [name], MD  LOS - 8    Chief Complaint  Patient presents with   Cough   Skin Ulcer       Brief Narrative   Please refer to hospitalist Dr. Teena Irani detailed summary from 10/13, in brief 85 year old female with C. difficile colitis status post transplant, hypertension, TIA, failure to thrive brought in by daughter with concern for sacral wound.  She had prolonged hospitalization from 8/24-10/6 for progressive generalized weakness, failure to thrive, weight loss.  Multiple subspecialties were involved in regards to her care.  Palliative care with goal for hospice was recommended but her daughter who is involved in her care has persistently refused. Patient had persistently poor appetite and daughter has consistently refused placement of NG tube or even a PEG tube.  Patient was discharged home after multiple discussion with the daughter that she would not show significant improvement.  Ethics committee was also involved during the previous hospital stay. She was discharged home with home health as daughter was resistant to SNF and hospice care.  Daughter brought her back to the hospital in 2 days reporting poor appetite and home health nurse concerned about her wheezing and sacral wound. During this hospitalization multiple subspecialties have been involved including surgery for her sacral wound (have started hydrotherapy), PCCM for pleural effusions (recommend this is likely from her vascular congestions).  Also  involved were ID for infected decubitus ulcer.  Neurology has seen patient previously for her critical illness myopathy.  Recommended for muscle biopsy but daughter had refused. Ethics committee was involved again.       Subjective:   Patient refusing meds again this morning.  Was tachycardic to 180s-200 when she was repositioned.  Return back to normal sinus rhythm shortly after pain medication given.   Assessment  & Plan :    Principal Problem:   Failure to thrive in adult  Extensive work-up done previous hospitalization with multiple specialists involved.  Was discharged after extensive hospital stay after being felt that patient had met maximum benefit along with involvement of ethics committee. Patient has severe deconditioning with suspicion for critical illness myopathy during last hospitalization.  Neurology had evaluated with consideration for muscle biopsy due to elevated ESR and CRP to rule out myositis but daughter refused.  CK and aldolase was normal.    Daughter has declined NG or PEG tube (she is not a candidate with any benefit).  Daughter has refused option for hospice as well. Patient intermittently tachycardic possibly with dehydration and severe deconditioning. Nutrition and PT following.  Seen by SLP and on dysphagia level 3 diet.  Requesting nursing involvement closely regarding her feeding. PT recommends home health upon discharge.  Continue Megace.  IV hydration as tolerated.  Acute respiratory failure with hypoxia Combination of volume overload with underlying diastolic dysfunction, severe malnutrition and possible intermittent aspiration. Continue flutter valve, incentive spirometry and antitussive. Dysphagia level 3 diet.  Pulmonary recommended comfort based approach (which daughter has been refusing) Continue supplemental oxygen.  Was given IV Flagyl with concern for aspiration as per ID recommendation and antibiotic stopped on 10/12. Continue I/O.  Sacral  decubitus ulcer stage IV with abscess Appreciate surgery and wound care evaluation. Patient continues to have discomfort over the sacral area and needs stronger pain medication however daughter refuses and only wants Tylenol. Was on empiric IV cefazolin, antibiotic switched to oral Keflex based on wound culture (GPC and gram variable rods). Started on hydrotherapy as per surgery recommendation.  Has been refusing hydrotherapy.  Discussed with surgery if patient continues to refuse and no role for prolonged hydrotherapy would consider discontinuing with dressing changes at home.  Hypokalemia Replenished  Hypophosphatemia Being replenished  Oral thrush Nystatin suspension  Normocytic anemia Stable.  Continue to monitor  Still C. difficile colitis surgical stool transplantation Noted to have increased frequency of loose stools past few days.  Antibiotic currently narrowed to oral Keflex.  Will discontinue PPI  Acute kidney injury Secondary to dehydration and infection.  Currently resolved.  Acute toxic metabolic encephalopathy Patient seems alert and awake although poorly communicative.  Does not need acute imaging at present  History of TIA Continue aspirin and Plavix   Severe protein calorie malnutrition Nutrition following.  Continue supplement and encourage p.o. intake.  Has extremely poor p.o. intake and continues to refuse food frequently.  Major depressive disorder, recurrent, mild Seen by psych and recommend to continue Zoloft  Diarrhea on 10/14 PPI discontinued.  Improved symptoms today  Code Status : Full code.  Discussed goals of care again with the daughter at bedside on 10/14.  Once her mother's medical condition to be restored back to her "normal"  Family Communication  : We will discuss with daughter.  If no role for hydrotherapy would on discharging patient home.  Disposition Plan  : Difficult effusion due to daughter's insistence to treatment options.  Will  discuss with surgery regarding duration of hydrotherapy and plan on discharge home with home health  Barriers For Discharge : Active symptoms  Consults  : Surgery, ID, ethics  Procedures  : Hydrotherapy  DVT Prophylaxis  :  Lovenox -  Lab Results  Component Value Date   PLT 176 02/10/2019    Antibiotics  :  Anti-infectives (From admission, onward)   Start     Dose/Rate Route Frequency Ordered Stop   02/09/19 1200  cephALEXin (KEFLEX) capsule 500 mg  500 mg Oral Every 12 hours 02/09/19 0952     02/06/19 1800  cephALEXin (KEFLEX) capsule 500 mg  Status:  Discontinued     500 mg Oral Every 6 hours 02/06/19 1500 02/09/19 0952   02/04/19 1400  metroNIDAZOLE (FLAGYL) IVPB 500 mg  Status:  Discontinued     500 mg 100 mL/hr over 60 Minutes Intravenous Every 8 hours 02/04/19 1343 02/07/19 1114   02/03/19 1500  ceFAZolin (ANCEF) IVPB 1 g/50 mL premix  Status:  Discontinued     1 g 100 mL/hr over 30 Minutes Intravenous Every 8 hours 02/03/19 1444 02/06/19 1500        Objective:   Vitals:   02/09/19 2041 02/10/19 0512 02/10/19 0900 02/10/19 1128  BP: 134/75 140/83    Pulse: 89 93 (!) 125 92  Resp: 18 20    Temp: 98.8 F (37.1 C) 98.6 F (37 C)    TempSrc: Oral Oral    SpO2: 95% 100%    Weight:      Height:        Wt Readings from Last 3 Encounters:  02/09/19 46.9 kg  01/25/19 60.4 kg  12/16/18 45.5 kg     Intake/Output Summary (Last 24 hours) at 02/10/2019 1222 Last data filed at 02/10/2019 1000 Gross per 24 hour  Intake 853.15 ml  Output 370 ml  Net 483.15 ml   Physical exam Elderly female, frail, not in distress, HEENT: Moist mucosa, supple neck Chest: Clear CVs: S1-S2 tachycardic GI: Soft, nondistended, nontender, Foley +, sacral decubitus ulcer Musculoskeletal: Warm, no edema     Data Review:    CBC Recent Labs  Lab 02/04/19 0540 02/05/19 0525 02/06/19 0459 02/07/19 0509 02/08/19 0543 02/10/19 0813  WBC 23.8* 22.5* 15.1* 13.9* 11.2* 9.6    HGB 9.7* 8.9* 8.9* 8.9* 8.7* 8.3*  HCT 29.7* 27.2* 27.5* 27.2* 27.0* 26.0*  PLT 233 212 204 187 194 176  MCV 95.5 95.8 98.6 96.8 98.2 100.4*  MCH 31.2 31.3 31.9 31.7 31.6 32.0  MCHC 32.7 32.7 32.4 32.7 32.2 31.9  RDW 17.5* 17.4* 17.5* 17.8* 18.4* 18.9*  LYMPHSABS 0.6* 1.0 0.4* 0.6* 0.4*  --   MONOABS 1.0 1.1* 0.7 0.6 0.6  --   EOSABS 0.0 0.0 0.0 0.0 0.0  --   BASOSABS 0.0 0.0 0.0 0.0 0.0  --     Chemistries  Recent Labs  Lab 02/04/19 0540 02/05/19 0525 02/06/19 0459 02/07/19 0509 02/08/19 0543 02/10/19 0813  NA 142 142 142 141 138 137  K 3.4* 3.3* 3.1* 3.0* 3.3* 3.1*  CL 110 110 109 110 109 108  CO2 19* 19* '23 22 22 23  ' GLUCOSE 146* 95 100* 100* 111* 91  BUN '20 22 18 21 23 21  ' CREATININE 0.95 1.08* 0.80 0.83 0.99 0.76  CALCIUM 8.1* 8.0* 7.9* 7.8* 7.7* 7.8*  MG 1.9 1.8 2.0 1.9 1.8  --   AST 35 35 '30 23 21  ' --   ALT '24 14 8 5 6  ' --   ALKPHOS 132* 122 112 111 123  --   BILITOT 1.0 1.1 1.0 0.9 1.1  --    ------------------------------------------------------------------------------------------------------------------ No results for input(s): CHOL, HDL, LDLCALC, TRIG, CHOLHDL, LDLDIRECT in the last 72 hours.  Lab Results  Component Value Date   HGBA1C 5.8 (H) 10/03/2017   ------------------------------------------------------------------------------------------------------------------ No results for input(s): TSH, T4TOTAL, T3FREE, THYROIDAB in the last 72 hours.  Invalid input(s): FREET3 ------------------------------------------------------------------------------------------------------------------ No results for input(s): VITAMINB12, FOLATE, FERRITIN, TIBC, IRON, RETICCTPCT in  the last 72 hours.  Coagulation profile No results for input(s): INR, PROTIME in the last 168 hours.  No results for input(s): DDIMER in the last 72 hours.  Cardiac Enzymes No results for input(s): CKMB, TROPONINI, MYOGLOBIN in the last 168 hours.  Invalid input(s):  CK ------------------------------------------------------------------------------------------------------------------ No results found for: BNP  Inpatient Medications  Scheduled Meds:  aspirin EC  81 mg Oral Daily   cephALEXin  500 mg Oral Q12H   Chlorhexidine Gluconate Cloth  6 each Topical Daily   clopidogrel  75 mg Oral Daily   collagenase   Topical Daily   feeding supplement (ENSURE ENLIVE)  237 mL Oral BID BM   folic acid  1 mg Oral Daily   Gerhardt's butt cream  1 application Topical TID   guaiFENesin  1,200 mg Oral BID   latanoprost  1 drop Both Eyes QHS   megestrol  400 mg Oral TID    morphine injection  2 mg Intravenous Once   multivitamin with minerals  1 tablet Oral Daily   nutrition supplement (JUVEN)  1 packet Oral BID BM   nystatin  5 mL Oral QID   nystatin cream   Topical BID   phosphorus  500 mg Oral Once   sertraline  25 mg Oral Daily   Continuous Infusions:  dextrose 30 mL/hr at 02/10/19 0647   PRN Meds:.acetaminophen **OR** acetaminophen, ipratropium, ketorolac, levalbuterol, loperamide, traMADol  Micro Results Recent Results (from the past 240 hour(s))  SARS CORONAVIRUS 2 (TAT 6-24 HRS) Nasopharyngeal Nasopharyngeal Swab     Status: None   Collection Time: 02/02/19  6:43 PM   Specimen: Nasopharyngeal Swab  Result Value Ref Range Status   SARS Coronavirus 2 NEGATIVE NEGATIVE Final    Comment: (NOTE) SARS-CoV-2 target nucleic acids are NOT DETECTED. The SARS-CoV-2 RNA is generally detectable in upper and lower respiratory specimens during the acute phase of infection. Negative results do not preclude SARS-CoV-2 infection, do not rule out co-infections with other pathogens, and should not be used as the sole basis for treatment or other patient management decisions. Negative results must be combined with clinical observations, patient history, and epidemiological information. The expected result is Negative. Fact Sheet for  Patients: SugarRoll.be Fact Sheet for Healthcare Providers: https://www.woods-mathews.com/ This test is not yet approved or cleared by the [place] FDA and  has been authorized for detection and/or diagnosis of SARS-CoV-2 by FDA under an Emergency Use Authorization (EUA). This EUA will remain  in effect (meaning this test can be used) for the duration of the COVID-19 declaration under Section 56 4(b)(1) of the Act, 21 U.S.C. section 360bbb-3(b)(1), unless the authorization is terminated or revoked sooner. Performed at [facility], [place] [address] [place], [place] [address]   Aerobic Culture (superficial specimen)     Status: None   Collection Time: 02/03/19 11:46 AM   Specimen: Sacral; Wound  Result Value Ref Range Status   Specimen Description   Final    SACRAL Performed at [place] [address] [place], [place] [address]    Special Requests   Final    NONE Performed at [facility], [place] [address] [place], [place] [address]    Gram Stain   Final    RARE WBC PRESENT,BOTH PMN AND MONONUCLEAR ABUNDANT GRAM POSITIVE COCCI IN PAIRS ABUNDANT GRAM VARIABLE ROD Performed at [facility], [place] [address] [place], [place] [address]    Culture   Final  ABUNDANT STREPTOCOCCUS ANGINOSIS FEW STAPHYLOCOCCUS AUREUS    Report Status 02/06/2019 FINAL  Final   Organism ID, Bacteria STREPTOCOCCUS ANGINOSIS  Final   Organism ID, Bacteria STAPHYLOCOCCUS AUREUS  Final      Susceptibility   Staphylococcus aureus - MIC*    CIPROFLOXACIN <=0.5 SENSITIVE Sensitive     ERYTHROMYCIN <=0.25 SENSITIVE Sensitive     GENTAMICIN <=0.5 SENSITIVE Sensitive     OXACILLIN 0.5 SENSITIVE Sensitive     TETRACYCLINE <=1 SENSITIVE Sensitive     VANCOMYCIN <=0.5 SENSITIVE Sensitive     TRIMETH/SULFA <=10 SENSITIVE Sensitive     CLINDAMYCIN <=0.25 SENSITIVE Sensitive     RIFAMPIN <=0.5 SENSITIVE  Sensitive     Inducible Clindamycin NEGATIVE Sensitive     * FEW STAPHYLOCOCCUS AUREUS   Streptococcus anginosis - MIC*    PENICILLIN <=0.06 SENSITIVE Sensitive     ERYTHROMYCIN <=0.12 SENSITIVE Sensitive     LEVOFLOXACIN 0.5 SENSITIVE Sensitive     VANCOMYCIN 0.5 SENSITIVE Sensitive     * ABUNDANT STREPTOCOCCUS ANGINOSIS    Radiology Reports Dg Abd 1 View  Result Date: 01/17/2019 CLINICAL DATA:  85 year old female with a history of enteric tube placement EXAM: ABDOMEN - 1 VIEW; DG NASO G TUBE PLC W/FL-NO RAD COMPARISON:  January 13, 2019 FINDINGS: Limited plain film demonstrates post pyloric tube terminating in the 3/4 portion of the duodenum. IMPRESSION: Post pyloric feeding tube terminates in the 3/4 portion the duodenum. Electronically Signed   By: Corrie Mckusick D.O.   On: 01/17/2019 15:55   Dg Abd 1 View  Result Date: 01/13/2019 CLINICAL DATA:  Blocked feeding tube. EXAM: ABDOMEN - 1 VIEW COMPARISON:  12/29/2018 FINDINGS: Enteric fusion tube is present with tip over the region of the distal stomach just right of midline in the mid abdomen. Bowel gas pattern is nonobstructive. Remainder of the exam is unchanged. IMPRESSION: Nonobstructive bowel gas pattern. Enteric feeding tube with tip right of midline in the mid abdomen likely over the distal stomach. Electronically Signed   By: Marin Olp M.D.   On: 01/13/2019 12:28   Ct Pelvis W Contrast  Result Date: 02/02/2019 CLINICAL DATA:  Sacral decubitus ulcer. EXAM: CT PELVIS WITH CONTRAST TECHNIQUE: Multidetector CT imaging of the pelvis was performed using the standard protocol following the bolus administration of intravenous contrast. CONTRAST:  50m OMNIPAQUE IOHEXOL 300 MG/ML  SOLN COMPARISON:  05/02/2018 FINDINGS: Urinary Tract:  No abnormality visualized. Bowel:  Unremarkable visualized pelvic bowel loops. Vascular/Lymphatic: Atheromatous arterial calcifications without aneurysm. No enlarged lymph nodes. Reproductive: Small uterus  containing multiple small, densely calcified fibroids. No adnexal mass. Other: Small superficial soft tissue defect posteriorly to the left of the coccyx. This does not extend to the bone and there is no bone destruction or periosteal reaction. Slightly more inferiorly, there is a very small fluid and gas collection, measuring 1.0 x 0.4 cm on image number 35 series 2 and 1.9 cm in length on coronal image number 78. Also noted is diffuse subcutaneous edema. The previously demonstrated bilateral inguinal hernias are no longer seen. Musculoskeletal: No bone destruction or periosteal reaction. Lower lumbar spine degenerative changes. IMPRESSION: 1. Small soft tissue ulcer posteriorly to the left of the coccyx with a 1.9 x 1.0 x 0.4 cm abscess slightly more inferiorly. 2. No evidence of osteomyelitis. 3. Diffuse subcutaneous edema. Electronically Signed   By: SClaudie ReveringM.D.   On: 02/02/2019 17:13   Mr Cervical Spine Wo Contrast  Result Date: 01/29/2019 CLINICAL DATA:  85year old female  with ataxia. Unexplained lower extremity weakness, spasticity. EXAM: MRI CERVICAL SPINE WITHOUT CONTRAST TECHNIQUE: Multiplanar, multisequence MR imaging of the cervical spine was performed. No intravenous contrast was administered. COMPARISON:  Brain MRI 12/21/2018.  Neck CT 10/13/2017. FINDINGS: Alignment: Preserved cervical lordosis similar to the 2019 neck CT. No spondylolisthesis. Vertebrae: No marrow edema or evidence of acute osseous abnormality. Visualized bone marrow signal is within normal limits. Cord: Suboptimal spinal cord detail due to motion artifact despite repeated imaging attempts, but no convincing cervical or upper thoracic spinal cord signal abnormality. Fairly capacious spinal canal. Posterior Fossa, vertebral arteries, paraspinal tissues: Stable cervicomedullary junction and visible posterior fossa. Preserved major vascular flow voids in the neck, the left vertebral artery appears mildly dominant. Disc  levels: Mild for age cervical spine degeneration with no cervical or upper thoracic spinal stenosis. There is i generally mild cervical neural foraminal stenosis despite some levels of facet hypertrophy. There is up to moderate stenosis at the left C4 nerve level. IMPRESSION: Essentially negative for age MRI appearance of the cervical spine. No spinal stenosis, and no cord signal abnormality identified although cord detail is suboptimal due to motion. Electronically Signed   By: Genevie Ann M.D.   On: 01/29/2019 15:12   Mr Thoracic Spine Wo Contrast  Result Date: 01/29/2019 CLINICAL DATA:  85 year old female with ataxia. Unexplained lower extremity weakness, spasticity. EXAM: MRI THORACIC SPINE WITHOUT CONTRAST TECHNIQUE: Multiplanar, multisequence MR imaging of the thoracic spine was performed. No intravenous contrast was administered. COMPARISON:  Cervical spine MRI today reported separately. FINDINGS: The examination had to be discontinued prior to completion due to patient pain. Only 1 set of axial images was obtained, and the study is intermittently degraded by motion. Limited cervical spine imaging:  Reported separately today. Thoracic spine segmentation:  Appears to be normal. Alignment: Mildly exaggerated thoracic kyphosis. No spondylolisthesis. Vertebrae: No marrow edema or evidence of acute osseous abnormality. Visualized bone marrow signal is within normal limits. Cord: No definite thoracic spinal cord signal abnormality is identified. The conus appears normal at T11-T12. There is questionable volume loss of the thoracic cord, somewhat generalized (e.g. So at the T2-T3 level series 22, image 8, and again at the T8-T9 level series 22, image 25). As in the cervical spine, the spinal canal is capacious. Paraspinal and other soft tissues: Small to moderate layering left left greater than right pleural effusions. Posterior paraspinal soft tissues appear within normal limits. Disc levels: Mild for age thoracic  spine degeneration similar to that in the cervical spine. No spinal or foraminal stenosis. IMPRESSION: 1. Questionable generalized thoracic spinal cord volume loss, but no definite focal cord signal abnormality. Cord detail is suboptimal due to motion. 2. Mild for age thoracic spine degeneration and capacious spinal canal. 3. Left greater than right layering pleural effusions. Electronically Signed   By: Genevie Ann M.D.   On: 01/29/2019 15:25   US Renal  Result Date: 02/03/2019 CLINICAL DATA:  Urinary retention EXAM: RENAL / URINARY TRACT ULTRASOUND COMPLETE COMPARISON:  CT dated 01/06/2019 FINDINGS: Right Kidney: Renal measurements: 8.8 x 4.2 x 3.4 cm = volume: 64 mL. There is mild hydronephrosis. Left Kidney: Renal measurements: 8.8 x 3.9 x 2.7 cm = volume: 48.7 mL. There is mild hydronephrosis. Bladder: Both ureteral jets were noted. There is free fluid in the patient's pelvis. IMPRESSION: 1. Mild bilateral hydronephrosis. 2. Both ureteral jets were visualized. 3. Free fluid in the pelvis. Electronically Signed   By: Constance Holster M.D.   On: 02/03/2019 18:15  Mr Femur Left Wo Contrast  Result Date: 01/30/2019 CLINICAL DATA:  Myelopathy, acute or progressive. Evaluate for myositis. EXAM: MR OF THE LEFT FEMUR WITHOUT CONTRAST TECHNIQUE: Coronal T1 and inversion recovery imaging of both thighs was performed. The patient refused additional imaging. No intravenous contrast was administered. COMPARISON:  Pelvic CT 01/06/2019. FINDINGS: Bones/Joint/Cartilage There is no evidence of acute fracture, dislocation or femoral head avascular necrosis. There are mild degenerative changes of both hips, similar to previous CT. Mild degenerative changes are also present in both knees, greater on the right. No large joint effusions. Ligaments Not relevant for examination/indication. Muscles and Tendons There is generalized soft tissue edema with mild T2 hyperintensity throughout the subcutaneous fat and muscles of both  thighs. No focal fluid collection or muscular atrophy identified. No gross tendon abnormalities on coronal imaging. Soft tissues As above, generalized soft tissue edema throughout both thighs. No focal fluid collections are identified. IMPRESSION: 1. Limited study, consisting of only coronal imaging of both thighs. 2. Nonspecific generalized soft tissue edema involving the muscles and subcutaneous fat of both thighs. Cannot exclude myositis. No focal fluid collection or muscular atrophy identified. 3. No acute osseous findings. Mild degenerative changes of both hips and knees. Electronically Signed   By: Richardean Sale M.D.   On: 01/30/2019 14:24   Dg Chest Port 1 View  Result Date: 02/07/2019 CLINICAL DATA:  Shortness of breath. Rhonchi. EXAM: PORTABLE CHEST 1 VIEW COMPARISON:  Chest x-ray 02/04/2019 and CT scan of the abdomen dated 01/06/2019 FINDINGS: Stable borderline cardiomegaly. Persistent consolidation/atelectasis/effusion at the left lung base. Clearing of the faint left perihilar infiltrate. Small right pleural effusion is slightly more prominent. The hazy infiltrate in the right upper lobe is slightly more extensive but less dense and the right perihilar infiltrate has almost resolved. Aortic atherosclerosis.  No acute bone abnormality. IMPRESSION: 1. Persistent consolidation/atelectasis at the left lung base. 2. Clearing of the left perihilar infiltrate. 3. Slightly more extensive but less dense infiltrate in the right upper lobe. 4. Small right pleural effusion, slightly increased. 5.  Aortic Atherosclerosis (ICD10-I70.0). Electronically Signed   By: Lorriane Shire M.D.   On: 02/07/2019 15:31   Dg Chest Port 1 View  Result Date: 02/04/2019 CLINICAL DATA:  Shortness of breath this morning. EXAM: PORTABLE CHEST 1 VIEW COMPARISON:  02/03/2019 FINDINGS: Stable enlarged cardiac silhouette and dense left lower lobe airspace opacity. No significant change in overall amount of patchy opacity in the  right upper lobe and both perihilar regions. Stable small right pleural effusion and probable small left pleural effusion. Diffuse osteopenia. IMPRESSION: 1. No significant change in dense left lower lobe atelectasis or pneumonia. 2. No significant change in probable pneumonia in the right upper lobe and both perihilar regions. Alveolar pulmonary edema is less likely in the absence of pulmonary vascular congestion and interstitial pulmonary edema. 3. Stable small right pleural effusion and probable small left pleural effusion. 4. Stable cardiomegaly. Electronically Signed   By: Claudie Revering M.D.   On: 02/04/2019 11:28   Dg Chest Port 1 View  Result Date: 02/03/2019 CLINICAL DATA:  Wheezing. EXAM: PORTABLE CHEST 1 VIEW COMPARISON:  February 02, 2019 FINDINGS: Enlarged cardiac silhouette. Calcific atherosclerotic disease of the aorta. Bilateral moderate in size pleural effusions. Worsening aeration of the lungs with focal airspace opacity in the right upper lobe. Mild pulmonary edema. Osseous structures are without acute abnormality. Soft tissues are grossly normal. IMPRESSION: 1. Bilateral moderate in size pleural effusions. 2. Worsening aeration of the lungs with  focal airspace opacity in the right upper lobe. 3. Mild pulmonary edema. 4. Calcific atherosclerotic disease of the aorta. Electronically Signed   By: Fidela Salisbury M.D.   On: 02/03/2019 17:38   Dg Chest Port 1 View  Result Date: 02/02/2019 CLINICAL DATA:  Cough and tachypnea with tachycardia. EXAM: PORTABLE CHEST 1 VIEW COMPARISON:  01/23/2019 FINDINGS: Patient slightly rotated to the left. Lungs are adequately inflated demonstrate hazy bilateral perihilar opacification likely mild interstitial edema. Suggestion of small amount of bilateral pleural fluid. Left effusion improved compared to previous exams. Mild stable cardiomegaly. Remainder of the exam is unchanged. IMPRESSION: Evidence of mild interstitial edema and small bilateral pleural  effusions with significant interval improvement in the left effusion. Central lung infection is possible but less likely. Mild stable cardiomegaly. Electronically Signed   By: Marin Olp M.D.   On: 02/02/2019 16:04   Dg Chest Port 1 View  Result Date: 01/23/2019 CLINICAL DATA:  Cough. EXAM: PORTABLE CHEST 1 VIEW COMPARISON:  Radiograph 01/13/2019 FINDINGS: Enteric tube has been removed. Lower lung volumes from prior. Patient is rotated. Cardiomegaly is more prominent than on prior exam. Unchanged mediastinal contours with aortic atherosclerosis. Moderate bilateral pleural effusions and associated bibasilar opacities, left greater than right. No evidence of pulmonary edema. No confluent airspace disease. No pneumothorax. IMPRESSION: 1. Moderate bilateral pleural effusions with associated bibasilar opacities, left greater than right, likely atelectasis. 2. Cardiomegaly is more prominent than on prior exam. Aortic Atherosclerosis (ICD10-I70.0). Electronically Signed   By: Keith Rake M.D.   On: 01/23/2019 19:30   Dg Chest Port 1 View  Result Date: 01/13/2019 CLINICAL DATA:  Patient is spitting up 2 feeds. Possible aspiration. EXAM: PORTABLE CHEST 1 VIEW COMPARISON:  Single-view of the chest 12/16/2018. FINDINGS: Feeding tube courses into the stomach and below the inferior margin of the film. The patient has moderate bilateral pleural effusions. Mild basilar atelectasis noted. There is cardiomegaly without edema. No pneumothorax. No acute or focal bony abnormality. IMPRESSION: Moderate layering pleural effusions with mild bibasilar atelectasis. Cardiomegaly without edema. Electronically Signed   By: Inge Rise M.D.   On: 01/13/2019 09:47   Dg Addison Bailey G Tube Plc W/fl-no Rad  Result Date: 01/17/2019 CLINICAL DATA:  85 year old female with a history of enteric tube placement EXAM: ABDOMEN - 1 VIEW; DG NASO G TUBE PLC W/FL-NO RAD COMPARISON:  January 13, 2019 FINDINGS: Limited plain film  demonstrates post pyloric tube terminating in the 3/4 portion of the duodenum. IMPRESSION: Post pyloric feeding tube terminates in the 3/4 portion the duodenum. Electronically Signed   By: Corrie Mckusick D.O.   On: 01/17/2019 15:55   US Liver Doppler  Result Date: 01/13/2019 CLINICAL DATA:  85 year old female with elevated liver enzymes. Evaluate for portal vein stenosis/occlusion. EXAM: DUPLEX ULTRASOUND OF LIVER TECHNIQUE: Color and duplex Doppler ultrasound was performed to evaluate the hepatic in-flow and out-flow vessels. COMPARISON:  Prior CT abdomen/pelvis 01/06/2019 FINDINGS: Liver: Normal parenchymal echogenicity. Normal hepatic contour without nodularity. No focal lesion, mass or intrahepatic biliary ductal dilatation. Portal Vein Velocities Main:  35 cm/sec with normal hepatopetal flow. Right:  23 cm/sec with normal hepatopetal flow. Left:  27 cm/sec with normal hepatopetal flow. Hepatic Vein Velocities Right:  16 cm/sec Middle:  27 cm/sec Left:  23 cm/sec IVC: Present and patent with normal respiratory phasicity. Hepatic Artery Velocity:  102 cm/sec Splenic Vein Velocity:  31 cm/sec Varices: Absent Ascites: Small volume ascites. Other: Bilateral pleural effusions are noted incidentally. IMPRESSION: 1. Patent and normal appearing  portal and hepatic veins. 2. Small volume ascites. 3. Bilateral moderate pleural effusions. 4. Normal sonographic appearance of the liver. Electronically Signed   By: Jacqulynn Cadet M.D.   On: 01/13/2019 16:00    Time Spent in minutes  25   Iyahna Obriant M.D on 02/10/2019 at 12:22 PM  Between 7am to 7pm - Pager - [phone]  After 7pm go to [email or web address] - password [facility]  [facility] -  Office  [phone]

## 2019-02-10 NOTE — Progress Notes (Signed)
Pt refused morning medications after encouragement from this RN and pt's daughter.  Was able to encourage pt to take antibiotic with small amount of applesauce. Dr. Clementeen Graham made aware that pt is refusing meds. Stacey Drain

## 2019-02-11 DIAGNOSIS — R338 Other retention of urine: Secondary | ICD-10-CM

## 2019-02-11 LAB — BASIC METABOLIC PANEL
Anion gap: 9 (ref 5–15)
BUN: 23 mg/dL (ref 8–23)
CO2: 21 mmol/L — ABNORMAL LOW (ref 22–32)
Calcium: 8 mg/dL — ABNORMAL LOW (ref 8.9–10.3)
Chloride: 107 mmol/L (ref 98–111)
Creatinine, Ser: 0.87 mg/dL (ref 0.44–1.00)
GFR calc Af Amer: >60 mL/min (ref >60)
GFR calc non Af Amer: >60 mL/min (ref >60)
Glucose, Bld: 106 mg/dL — ABNORMAL HIGH (ref 70–99)
Potassium: 3.2 mmol/L — ABNORMAL LOW (ref 3.5–5.1)
Sodium: 137 mmol/L (ref 135–145)

## 2019-02-11 LAB — MAGNESIUM: Magnesium: 1.9 mg/dL (ref 1.7–2.4)

## 2019-02-11 LAB — GLUCOSE, CAPILLARY: Glucose-Capillary: 101 mg/dL — ABNORMAL HIGH (ref 70–99)

## 2019-02-11 MED ORDER — METOPROLOL TARTRATE 5 MG/5ML IV SOLN
5.0000 mg | INTRAVENOUS | Status: AC | PRN
  Administered 2019-02-11 – 2019-02-12 (×2): 5 mg via INTRAVENOUS
  Filled 2019-02-11: qty 5

## 2019-02-11 MED ORDER — LIP MEDEX EX OINT
TOPICAL_OINTMENT | CUTANEOUS | Status: AC
  Administered 2019-02-11: 10:00:00
  Filled 2019-02-11: qty 7

## 2019-02-11 MED ORDER — METOPROLOL TARTRATE 5 MG/5ML IV SOLN
INTRAVENOUS | Status: AC
  Filled 2019-02-11: qty 5

## 2019-02-11 NOTE — Progress Notes (Signed)
PROGRESS NOTE                                                                                                                                                                                                             Patient Demographics:    Sydney [name], is a 85 y.o. female, DOB - 1930/10/24, ZOX:[id number]  Admit date - 02/02/2019   Admitting Physician Orene Desanctis, DO  Outpatient Primary MD for the patient is [name], MD  LOS - 9    Chief Complaint  Patient presents with   Cough   Skin Ulcer       Brief Narrative   Please refer to hospitalist Dr. Teena Irani detailed summary from 10/13, in brief 85 year old female with C. difficile colitis status post transplant, hypertension, TIA, failure to thrive brought in by daughter with concern for sacral wound.  She had prolonged hospitalization from 8/24-10/6 for progressive generalized weakness, failure to thrive, weight loss.  Multiple subspecialties were involved in regards to her care.  Palliative care with goal for hospice was recommended but her daughter who is involved in her care has persistently refused. Patient had persistently poor appetite and daughter has consistently refused placement of NG tube or even a PEG tube.  Patient was discharged home after multiple discussion with the daughter that she would not show significant improvement.  Ethics committee was also involved during the previous hospital stay. She was discharged home with home health as daughter was resistant to SNF and hospice care.  Daughter brought her back to the hospital in 2 days reporting poor appetite and home health nurse concerned about her wheezing and sacral wound. During this hospitalization multiple subspecialties have been involved including surgery for her sacral wound (have started hydrotherapy), PCCM for pleural effusions (recommend this is likely from her vascular congestions).  Also  involved were ID for infected decubitus ulcer.  Neurology has seen patient previously for her critical illness myopathy.  Recommended for muscle biopsy but daughter had refused. Ethics committee was involved again.       Subjective:   Still has poor p.o. intake.   tachycardic with pain and when turned around.  Foley removed yesterday afternoon.  Reportedly have not noticed patient voiding.   Assessment  & Plan :    Principal Problem:   Failure to thrive in adult Extensive work-up done previous hospitalization with multiple specialists involved.  Was discharged after extensive hospital stay after being felt that patient had met maximum benefit along with involvement of ethics committee. Patient has severe deconditioning with suspicion for critical illness myopathy during last hospitalization.  Neurology had evaluated with consideration for muscle biopsy due to elevated ESR and CRP to rule out myositis but daughter refused.  CK and aldolase was normal.    Daughter has declined NG or PEG tube (she is not a candidate with any benefit).  Daughter has refused option for hospice as well. Patient intermittently tachycardic possibly with dehydration and severe deconditioning. Nutrition and PT following.  Seen by SLP and on dysphagia level 3 diet.  Requesting nursing involvement closely regarding her feeding. PT recommends home health upon discharge.  Continue Megace.  IV hydration as tolerated.  Acute respiratory failure with hypoxia Combination of volume overload with underlying diastolic dysfunction, severe malnutrition and possible intermittent aspiration. Continue flutter valve, incentive spirometry and antitussive. Dysphagia level 3 diet.  Pulmonary recommended comfort based approach (which daughter has been refusing) Continue supplemental oxygen.  Was given IV Flagyl with concern for aspiration as per ID recommendation and antibiotic stopped on 10/12. Continue I/O.  Sacral decubitus ulcer  stage IV with abscess Appreciate surgery and wound care evaluation. Patient continues to have discomfort over the sacral area and needs stronger pain medication however daughter refuses and only wants Tylenol. Was on empiric IV cefazolin, antibiotic switched to oral Keflex based on wound culture (GPC and gram variable rods). Discussed with surgery.  Wound has cleaned up significantly with hydrotherapy and Santyl.  Cleared for home health dressing changes.  Plan to teach daughter for dressing changes at home.  Home health RN arranged. Foley removed.  Will monitor for self voiding.  O2 sat stable on room air and does not need home O2.   Hypokalemia Replenished  Hypophosphatemia Being replenished  Oral thrush Nystatin suspension  Normocytic anemia Stable.  Continue to monitor  Still C. difficile colitis surgical stool transplantation Noted to have increased frequency of loose stools past few days.  Antibiotic currently narrowed to oral Keflex.  Will discontinue PPI  Acute kidney injury Secondary to dehydration and infection.  Currently resolved.  Acute toxic metabolic encephalopathy Patient seems alert and awake although poorly communicative.  Does not need acute imaging at present  History of TIA Continue aspirin and Plavix   Severe protein calorie malnutrition Nutrition following.  Continue supplement and encourage p.o. intake.  Has extremely poor p.o. intake and continues to refuse food frequently.  Major depressive disorder, recurrent, mild Seen by psych and recommend to continue Zoloft  Diarrhea on 10/14 PPI discontinued.  Resolved.  Code Status : Full code.  Discussed goals of care again with the daughter at bedside on 10/14.  Once her mother's medical condition to be restored back to her "normal"  Family Communication  : Daughter involved in care.    Disposition Plan  : Home possibly on 10/19 if underlying condition stable   Barriers For Discharge : Active  symptoms  Consults  : Surgery, ID, ethics  Procedures  : Hydrotherapy  DVT Prophylaxis  :  Lovenox -  Lab Results  Component Value Date   PLT 176 02/10/2019    Antibiotics  :  Anti-infectives (From admission, onward)  Start     Dose/Rate Route Frequency Ordered Stop   02/09/19 1200  cephALEXin (KEFLEX) capsule 500 mg     500 mg Oral Every 12 hours 02/09/19 0952     02/06/19 1800  cephALEXin (KEFLEX) capsule 500 mg  Status:  Discontinued     500 mg Oral Every 6 hours 02/06/19 1500 02/09/19 0952   02/04/19 1400  metroNIDAZOLE (FLAGYL) IVPB 500 mg  Status:  Discontinued     500 mg 100 mL/hr over 60 Minutes Intravenous Every 8 hours 02/04/19 1343 02/07/19 1114   02/03/19 1500  ceFAZolin (ANCEF) IVPB 1 g/50 mL premix  Status:  Discontinued     1 g 100 mL/hr over 30 Minutes Intravenous Every 8 hours 02/03/19 1444 02/06/19 1500        Objective:   Vitals:   02/10/19 2223 02/11/19 0409 02/11/19 0439 02/11/19 0528  BP: (!) 145/85 (!) 145/93  133/81  Pulse: (!) 104 (!) 117  (!) 102  Resp: _0 Temp: 98.6 F (37 C) 99.3 F (37.4 C) 100 F (37.8 C) 98.8 F (37.1 C)  TempSrc: Axillary Axillary Rectal Oral  SpO2: 92% 93%  94%  Weight:      Height:        Wt Readings from Last 3 Encounters:  02/09/19 46.9 kg  01/25/19 60.4 kg  12/16/18 45.5 kg     Intake/Output Summary (Last 24 hours) at 02/11/2019 1056 Last data filed at 02/11/2019 0600 Gross per 24 hour  Intake 599.49 ml  Output 100 ml  Net 499.49 ml   Physical exam Frail elderly female HEENT: Moist mucosa, supple neck Chest: Clear CVs: Normal S1-S2 GI: Soft, nondistended, nontender, sacral decubitus ulcer Musculoskeletal: Warm, no edema     Data Review:    CBC Recent Labs  Lab 02/05/19 0525 02/06/19 0459 02/07/19 0509 02/08/19 0543 02/10/19 0813  WBC 22.5* 15.1* 13.9* 11.2* 9.6  HGB 8.9* 8.9* 8.9* 8.7* 8.3*  HCT 27.2* 27.5* 27.2* 27.0* 26.0*  PLT 212 204 187 194 176  MCV 95.8 98.6 96.8  98.2 100.4*  MCH 31.3 31.9 31.7 31.6 32.0  MCHC 32.7 32.4 32.7 32.2 31.9  RDW 17.4* 17.5* 17.8* 18.4* 18.9*  LYMPHSABS 1.0 0.4* 0.6* 0.4*  --   MONOABS 1.1* 0.7 0.6 0.6  --   EOSABS 0.0 0.0 0.0 0.0  --   BASOSABS 0.0 0.0 0.0 0.0  --     Chemistries  Recent Labs  Lab 02/05/19 0525 02/06/19 0459 02/07/19 0509 02/08/19 0543 02/10/19 0813 02/11/19 0746  NA 142 142 141 138 137 137  K 3.3* 3.1* 3.0* 3.3* 3.1* 3.2*  CL 110 109 110 109 108 107  CO2 19* _1 21*  GLUCOSE 95 100* 100* 111* 91 106*  BUN _2 CREATININE 1.08* 0.80 0.83 0.99 0.76 0.87  CALCIUM 8.0* 7.9* 7.8* 7.7* 7.8* 8.0*  MG 1.8 2.0 1.9 1.8  --  1.9  AST 35 _3 --   --   ALT _4 --   --   ALKPHOS 122 112 111 123  --   --   BILITOT 1.1 1.0 0.9 1.1  --   --    ------------------------------------------------------------------------------------------------------------------ No results for input(s): CHOL, HDL, LDLCALC, TRIG, CHOLHDL, LDLDIRECT in the last 72 hours.  Lab Results  Component Value Date   HGBA1C 5.8 (H) 10/03/2017   ------------------------------------------------------------------------------------------------------------------ No results for input(s): TSH, T4TOTAL, T3FREE,  THYROIDAB in the last 72 hours.  Invalid input(s): FREET3 ------------------------------------------------------------------------------------------------------------------ No results for input(s): VITAMINB12, FOLATE, FERRITIN, TIBC, IRON, RETICCTPCT in the last 72 hours.  Coagulation profile No results for input(s): INR, PROTIME in the last 168 hours.  No results for input(s): DDIMER in the last 72 hours.  Cardiac Enzymes No results for input(s): CKMB, TROPONINI, MYOGLOBIN in the last 168 hours.  Invalid input(s): CK ------------------------------------------------------------------------------------------------------------------ No results found for: BNP  Inpatient  Medications  Scheduled Meds:  aspirin EC  81 mg Oral Daily   cephALEXin  500 mg Oral Q12H   Chlorhexidine Gluconate Cloth  6 each Topical Daily   clopidogrel  75 mg Oral Daily   collagenase   Topical Daily   feeding supplement (ENSURE ENLIVE)  237 mL Oral BID BM   folic acid  1 mg Oral Daily   Gerhardt's butt cream  1 application Topical TID   guaiFENesin  1,200 mg Oral BID   latanoprost  1 drop Both Eyes QHS   lip balm       megestrol  400 mg Oral TID    morphine injection  2 mg Intravenous Once   multivitamin with minerals  1 tablet Oral Daily   nutrition supplement (JUVEN)  1 packet Oral BID BM   nystatin  5 mL Oral QID   nystatin cream   Topical BID   phosphorus  500 mg Oral Once   sertraline  25 mg Oral Daily   Continuous Infusions:  dextrose 30 mL/hr at 02/10/19 0647   PRN Meds:.acetaminophen **OR** acetaminophen, ipratropium, ketorolac, levalbuterol, loperamide, metoprolol tartrate, traMADol  Micro Results Recent Results (from the past 240 hour(s))  SARS CORONAVIRUS 2 (TAT 6-24 HRS) Nasopharyngeal Nasopharyngeal Swab     Status: None   Collection Time: 02/02/19  6:43 PM   Specimen: Nasopharyngeal Swab  Result Value Ref Range Status   SARS Coronavirus 2 NEGATIVE NEGATIVE Final    Comment: (NOTE) SARS-CoV-2 target nucleic acids are NOT DETECTED. The SARS-CoV-2 RNA is generally detectable in upper and lower respiratory specimens during the acute phase of infection. Negative results do not preclude SARS-CoV-2 infection, do not rule out co-infections with other pathogens, and should not be used as the sole basis for treatment or other patient management decisions. Negative results must be combined with clinical observations, patient history, and epidemiological information. The expected result is Negative. Fact Sheet for Patients: SugarRoll.be Fact Sheet for Healthcare  Providers: https://www.woods-mathews.com/ This test is not yet approved or cleared by the [place] FDA and  has been authorized for detection and/or diagnosis of SARS-CoV-2 by FDA under an Emergency Use Authorization (EUA). This EUA will remain  in effect (meaning this test can be used) for the duration of the COVID-19 declaration under Section 56 4(b)(1) of the Act, 21 U.S.C. section 360bbb-3(b)(1), unless the authorization is terminated or revoked sooner. Performed at [facility], [place] [address] [place], [place] [address]   Aerobic Culture (superficial specimen)     Status: None   Collection Time: 02/03/19 11:46 AM   Specimen: Sacral; Wound  Result Value Ref Range Status   Specimen Description   Final    SACRAL Performed at [facility] [address], [place], [place] [address]    Special Requests   Final    NONE Performed at [facility], [place] [address], [place], [place] [address]    Gram Stain   Final    RARE WBC PRESENT,BOTH PMN AND MONONUCLEAR ABUNDANT  GRAM POSITIVE COCCI IN PAIRS ABUNDANT GRAM VARIABLE ROD Performed at [facility], [place] [address] [place], [place] [address]    Culture   Final    ABUNDANT STREPTOCOCCUS ANGINOSIS FEW STAPHYLOCOCCUS AUREUS    Report Status 02/06/2019 FINAL  Final   Organism ID, Bacteria STREPTOCOCCUS ANGINOSIS  Final   Organism ID, Bacteria STAPHYLOCOCCUS AUREUS  Final      Susceptibility   Staphylococcus aureus - MIC*    CIPROFLOXACIN <=0.5 SENSITIVE Sensitive     ERYTHROMYCIN <=0.25 SENSITIVE Sensitive     GENTAMICIN <=0.5 SENSITIVE Sensitive     OXACILLIN 0.5 SENSITIVE Sensitive     TETRACYCLINE <=1 SENSITIVE Sensitive     VANCOMYCIN <=0.5 SENSITIVE Sensitive     TRIMETH/SULFA <=10 SENSITIVE Sensitive     CLINDAMYCIN <=0.25 SENSITIVE Sensitive     RIFAMPIN <=0.5 SENSITIVE Sensitive     Inducible Clindamycin NEGATIVE Sensitive     * FEW STAPHYLOCOCCUS  AUREUS   Streptococcus anginosis - MIC*    PENICILLIN <=0.06 SENSITIVE Sensitive     ERYTHROMYCIN <=0.12 SENSITIVE Sensitive     LEVOFLOXACIN 0.5 SENSITIVE Sensitive     VANCOMYCIN 0.5 SENSITIVE Sensitive     * ABUNDANT STREPTOCOCCUS ANGINOSIS    Radiology Reports Dg Abd 1 View  Result Date: 01/17/2019 CLINICAL DATA:  85 year old female with a history of enteric tube placement EXAM: ABDOMEN - 1 VIEW; DG NASO G TUBE PLC W/FL-NO RAD COMPARISON:  January 13, 2019 FINDINGS: Limited plain film demonstrates post pyloric tube terminating in the 3/4 portion of the duodenum. IMPRESSION: Post pyloric feeding tube terminates in the 3/4 portion the duodenum. Electronically Signed   By: Corrie Mckusick D.O.   On: 01/17/2019 15:55   Dg Abd 1 View  Result Date: 01/13/2019 CLINICAL DATA:  Blocked feeding tube. EXAM: ABDOMEN - 1 VIEW COMPARISON:  12/29/2018 FINDINGS: Enteric fusion tube is present with tip over the region of the distal stomach just right of midline in the mid abdomen. Bowel gas pattern is nonobstructive. Remainder of the exam is unchanged. IMPRESSION: Nonobstructive bowel gas pattern. Enteric feeding tube with tip right of midline in the mid abdomen likely over the distal stomach. Electronically Signed   By: Marin Olp M.D.   On: 01/13/2019 12:28   Ct Pelvis W Contrast  Result Date: 02/02/2019 CLINICAL DATA:  Sacral decubitus ulcer. EXAM: CT PELVIS WITH CONTRAST TECHNIQUE: Multidetector CT imaging of the pelvis was performed using the standard protocol following the bolus administration of intravenous contrast. CONTRAST:  58m OMNIPAQUE IOHEXOL 300 MG/ML  SOLN COMPARISON:  05/02/2018 FINDINGS: Urinary Tract:  No abnormality visualized. Bowel:  Unremarkable visualized pelvic bowel loops. Vascular/Lymphatic: Atheromatous arterial calcifications without aneurysm. No enlarged lymph nodes. Reproductive: Small uterus containing multiple small, densely calcified fibroids. No adnexal mass. Other:  Small superficial soft tissue defect posteriorly to the left of the coccyx. This does not extend to the bone and there is no bone destruction or periosteal reaction. Slightly more inferiorly, there is a very small fluid and gas collection, measuring 1.0 x 0.4 cm on image number 35 series 2 and 1.9 cm in length on coronal image number 78. Also noted is diffuse subcutaneous edema. The previously demonstrated bilateral inguinal hernias are no longer seen. Musculoskeletal: No bone destruction or periosteal reaction. Lower lumbar spine degenerative changes. IMPRESSION: 1. Small soft tissue ulcer posteriorly to the left of the coccyx with a 1.9 x 1.0 x 0.4 cm abscess slightly more inferiorly. 2. No evidence of osteomyelitis. 3. Diffuse  subcutaneous edema. Electronically Signed   By: Claudie Revering M.D.   On: 02/02/2019 17:13   Mr Cervical Spine Wo Contrast  Result Date: 01/29/2019 CLINICAL DATA:  85 year old female with ataxia. Unexplained lower extremity weakness, spasticity. EXAM: MRI CERVICAL SPINE WITHOUT CONTRAST TECHNIQUE: Multiplanar, multisequence MR imaging of the cervical spine was performed. No intravenous contrast was administered. COMPARISON:  Brain MRI 12/21/2018.  Neck CT 10/13/2017. FINDINGS: Alignment: Preserved cervical lordosis similar to the 2019 neck CT. No spondylolisthesis. Vertebrae: No marrow edema or evidence of acute osseous abnormality. Visualized bone marrow signal is within normal limits. Cord: Suboptimal spinal cord detail due to motion artifact despite repeated imaging attempts, but no convincing cervical or upper thoracic spinal cord signal abnormality. Fairly capacious spinal canal. Posterior Fossa, vertebral arteries, paraspinal tissues: Stable cervicomedullary junction and visible posterior fossa. Preserved major vascular flow voids in the neck, the left vertebral artery appears mildly dominant. Disc levels: Mild for age cervical spine degeneration with no cervical or upper thoracic  spinal stenosis. There is i generally mild cervical neural foraminal stenosis despite some levels of facet hypertrophy. There is up to moderate stenosis at the left C4 nerve level. IMPRESSION: Essentially negative for age MRI appearance of the cervical spine. No spinal stenosis, and no cord signal abnormality identified although cord detail is suboptimal due to motion. Electronically Signed   By: Genevie Ann M.D.   On: 01/29/2019 15:12   Mr Thoracic Spine Wo Contrast  Result Date: 01/29/2019 CLINICAL DATA:  85 year old female with ataxia. Unexplained lower extremity weakness, spasticity. EXAM: MRI THORACIC SPINE WITHOUT CONTRAST TECHNIQUE: Multiplanar, multisequence MR imaging of the thoracic spine was performed. No intravenous contrast was administered. COMPARISON:  Cervical spine MRI today reported separately. FINDINGS: The examination had to be discontinued prior to completion due to patient pain. Only 1 set of axial images was obtained, and the study is intermittently degraded by motion. Limited cervical spine imaging:  Reported separately today. Thoracic spine segmentation:  Appears to be normal. Alignment: Mildly exaggerated thoracic kyphosis. No spondylolisthesis. Vertebrae: No marrow edema or evidence of acute osseous abnormality. Visualized bone marrow signal is within normal limits. Cord: No definite thoracic spinal cord signal abnormality is identified. The conus appears normal at T11-T12. There is questionable volume loss of the thoracic cord, somewhat generalized (e.g. So at the T2-T3 level series 22, image 8, and again at the T8-T9 level series 22, image 25). As in the cervical spine, the spinal canal is capacious. Paraspinal and other soft tissues: Small to moderate layering left left greater than right pleural effusions. Posterior paraspinal soft tissues appear within normal limits. Disc levels: Mild for age thoracic spine degeneration similar to that in the cervical spine. No spinal or foraminal  stenosis. IMPRESSION: 1. Questionable generalized thoracic spinal cord volume loss, but no definite focal cord signal abnormality. Cord detail is suboptimal due to motion. 2. Mild for age thoracic spine degeneration and capacious spinal canal. 3. Left greater than right layering pleural effusions. Electronically Signed   By: Genevie Ann M.D.   On: 01/29/2019 15:25   US Renal  Result Date: 02/03/2019 CLINICAL DATA:  Urinary retention EXAM: RENAL / URINARY TRACT ULTRASOUND COMPLETE COMPARISON:  CT dated 01/06/2019 FINDINGS: Right Kidney: Renal measurements: 8.8 x 4.2 x 3.4 cm = volume: 64 mL. There is mild hydronephrosis. Left Kidney: Renal measurements: 8.8 x 3.9 x 2.7 cm = volume: 48.7 mL. There is mild hydronephrosis. Bladder: Both ureteral jets were noted. There is free fluid in the patient's  pelvis. IMPRESSION: 1. Mild bilateral hydronephrosis. 2. Both ureteral jets were visualized. 3. Free fluid in the pelvis. Electronically Signed   By: Constance Holster M.D.   On: 02/03/2019 18:15   Mr Femur Left Wo Contrast  Result Date: 01/30/2019 CLINICAL DATA:  Myelopathy, acute or progressive. Evaluate for myositis. EXAM: MR OF THE LEFT FEMUR WITHOUT CONTRAST TECHNIQUE: Coronal T1 and inversion recovery imaging of both thighs was performed. The patient refused additional imaging. No intravenous contrast was administered. COMPARISON:  Pelvic CT 01/06/2019. FINDINGS: Bones/Joint/Cartilage There is no evidence of acute fracture, dislocation or femoral head avascular necrosis. There are mild degenerative changes of both hips, similar to previous CT. Mild degenerative changes are also present in both knees, greater on the right. No large joint effusions. Ligaments Not relevant for examination/indication. Muscles and Tendons There is generalized soft tissue edema with mild T2 hyperintensity throughout the subcutaneous fat and muscles of both thighs. No focal fluid collection or muscular atrophy identified. No gross tendon  abnormalities on coronal imaging. Soft tissues As above, generalized soft tissue edema throughout both thighs. No focal fluid collections are identified. IMPRESSION: 1. Limited study, consisting of only coronal imaging of both thighs. 2. Nonspecific generalized soft tissue edema involving the muscles and subcutaneous fat of both thighs. Cannot exclude myositis. No focal fluid collection or muscular atrophy identified. 3. No acute osseous findings. Mild degenerative changes of both hips and knees. Electronically Signed   By: Richardean Sale M.D.   On: 01/30/2019 14:24   Dg Chest Port 1 View  Result Date: 02/07/2019 CLINICAL DATA:  Shortness of breath. Rhonchi. EXAM: PORTABLE CHEST 1 VIEW COMPARISON:  Chest x-ray 02/04/2019 and CT scan of the abdomen dated 01/06/2019 FINDINGS: Stable borderline cardiomegaly. Persistent consolidation/atelectasis/effusion at the left lung base. Clearing of the faint left perihilar infiltrate. Small right pleural effusion is slightly more prominent. The hazy infiltrate in the right upper lobe is slightly more extensive but less dense and the right perihilar infiltrate has almost resolved. Aortic atherosclerosis.  No acute bone abnormality. IMPRESSION: 1. Persistent consolidation/atelectasis at the left lung base. 2. Clearing of the left perihilar infiltrate. 3. Slightly more extensive but less dense infiltrate in the right upper lobe. 4. Small right pleural effusion, slightly increased. 5.  Aortic Atherosclerosis (ICD10-I70.0). Electronically Signed   By: Lorriane Shire M.D.   On: 02/07/2019 15:31   Dg Chest Port 1 View  Result Date: 02/04/2019 CLINICAL DATA:  Shortness of breath this morning. EXAM: PORTABLE CHEST 1 VIEW COMPARISON:  02/03/2019 FINDINGS: Stable enlarged cardiac silhouette and dense left lower lobe airspace opacity. No significant change in overall amount of patchy opacity in the right upper lobe and both perihilar regions. Stable small right pleural effusion  and probable small left pleural effusion. Diffuse osteopenia. IMPRESSION: 1. No significant change in dense left lower lobe atelectasis or pneumonia. 2. No significant change in probable pneumonia in the right upper lobe and both perihilar regions. Alveolar pulmonary edema is less likely in the absence of pulmonary vascular congestion and interstitial pulmonary edema. 3. Stable small right pleural effusion and probable small left pleural effusion. 4. Stable cardiomegaly. Electronically Signed   By: Claudie Revering M.D.   On: 02/04/2019 11:28   Dg Chest Port 1 View  Result Date: 02/03/2019 CLINICAL DATA:  Wheezing. EXAM: PORTABLE CHEST 1 VIEW COMPARISON:  February 02, 2019 FINDINGS: Enlarged cardiac silhouette. Calcific atherosclerotic disease of the aorta. Bilateral moderate in size pleural effusions. Worsening aeration of the lungs with focal airspace opacity  in the right upper lobe. Mild pulmonary edema. Osseous structures are without acute abnormality. Soft tissues are grossly normal. IMPRESSION: 1. Bilateral moderate in size pleural effusions. 2. Worsening aeration of the lungs with focal airspace opacity in the right upper lobe. 3. Mild pulmonary edema. 4. Calcific atherosclerotic disease of the aorta. Electronically Signed   By: Fidela Salisbury M.D.   On: 02/03/2019 17:38   Dg Chest Port 1 View  Result Date: 02/02/2019 CLINICAL DATA:  Cough and tachypnea with tachycardia. EXAM: PORTABLE CHEST 1 VIEW COMPARISON:  01/23/2019 FINDINGS: Patient slightly rotated to the left. Lungs are adequately inflated demonstrate hazy bilateral perihilar opacification likely mild interstitial edema. Suggestion of small amount of bilateral pleural fluid. Left effusion improved compared to previous exams. Mild stable cardiomegaly. Remainder of the exam is unchanged. IMPRESSION: Evidence of mild interstitial edema and small bilateral pleural effusions with significant interval improvement in the left effusion. Central lung  infection is possible but less likely. Mild stable cardiomegaly. Electronically Signed   By: Marin Olp M.D.   On: 02/02/2019 16:04   Dg Chest Port 1 View  Result Date: 01/23/2019 CLINICAL DATA:  Cough. EXAM: PORTABLE CHEST 1 VIEW COMPARISON:  Radiograph 01/13/2019 FINDINGS: Enteric tube has been removed. Lower lung volumes from prior. Patient is rotated. Cardiomegaly is more prominent than on prior exam. Unchanged mediastinal contours with aortic atherosclerosis. Moderate bilateral pleural effusions and associated bibasilar opacities, left greater than right. No evidence of pulmonary edema. No confluent airspace disease. No pneumothorax. IMPRESSION: 1. Moderate bilateral pleural effusions with associated bibasilar opacities, left greater than right, likely atelectasis. 2. Cardiomegaly is more prominent than on prior exam. Aortic Atherosclerosis (ICD10-I70.0). Electronically Signed   By: Keith Rake M.D.   On: 01/23/2019 19:30   Dg Chest Port 1 View  Result Date: 01/13/2019 CLINICAL DATA:  Patient is spitting up 2 feeds. Possible aspiration. EXAM: PORTABLE CHEST 1 VIEW COMPARISON:  Single-view of the chest 12/16/2018. FINDINGS: Feeding tube courses into the stomach and below the inferior margin of the film. The patient has moderate bilateral pleural effusions. Mild basilar atelectasis noted. There is cardiomegaly without edema. No pneumothorax. No acute or focal bony abnormality. IMPRESSION: Moderate layering pleural effusions with mild bibasilar atelectasis. Cardiomegaly without edema. Electronically Signed   By: Inge Rise M.D.   On: 01/13/2019 09:47   Dg Addison Bailey G Tube Plc W/fl-no Rad  Result Date: 01/17/2019 CLINICAL DATA:  85 year old female with a history of enteric tube placement EXAM: ABDOMEN - 1 VIEW; DG NASO G TUBE PLC W/FL-NO RAD COMPARISON:  January 13, 2019 FINDINGS: Limited plain film demonstrates post pyloric tube terminating in the 3/4 portion of the duodenum. IMPRESSION:  Post pyloric feeding tube terminates in the 3/4 portion the duodenum. Electronically Signed   By: Corrie Mckusick D.O.   On: 01/17/2019 15:55   US Liver Doppler  Result Date: 01/13/2019 CLINICAL DATA:  85 year old female with elevated liver enzymes. Evaluate for portal vein stenosis/occlusion. EXAM: DUPLEX ULTRASOUND OF LIVER TECHNIQUE: Color and duplex Doppler ultrasound was performed to evaluate the hepatic in-flow and out-flow vessels. COMPARISON:  Prior CT abdomen/pelvis 01/06/2019 FINDINGS: Liver: Normal parenchymal echogenicity. Normal hepatic contour without nodularity. No focal lesion, mass or intrahepatic biliary ductal dilatation. Portal Vein Velocities Main:  35 cm/sec with normal hepatopetal flow. Right:  23 cm/sec with normal hepatopetal flow. Left:  27 cm/sec with normal hepatopetal flow. Hepatic Vein Velocities Right:  16 cm/sec Middle:  27 cm/sec Left:  23 cm/sec IVC: Present and patent with  normal respiratory phasicity. Hepatic Artery Velocity:  102 cm/sec Splenic Vein Velocity:  31 cm/sec Varices: Absent Ascites: Small volume ascites. Other: Bilateral pleural effusions are noted incidentally. IMPRESSION: 1. Patent and normal appearing portal and hepatic veins. 2. Small volume ascites. 3. Bilateral moderate pleural effusions. 4. Normal sonographic appearance of the liver. Electronically Signed   By: Jacqulynn Cadet M.D.   On: 01/13/2019 16:00    Time Spent in minutes  25   Miyeko Mahlum M.D on 02/11/2019 at 10:56 AM  Between 7am to 7pm - Pager - [phone]  After 7pm go to [email or web address] - password [facility]  [facility] -  Office  [phone]

## 2019-02-11 NOTE — Plan of Care (Signed)
Continue current POC 

## 2019-02-11 NOTE — Progress Notes (Signed)
Patient with HR in 150s-170s. EKG confirmed A-fib with rvr. On call provider paged. Awaiting new orders. Primary RN made aware. Will continue to monitor pt and carry out any new orders. Sydney [name] I

## 2019-02-12 LAB — GLUCOSE, CAPILLARY
Glucose-Capillary: 88 mg/dL (ref 70–99)
Glucose-Capillary: 92 mg/dL (ref 70–99)
Glucose-Capillary: 108 mg/dL — ABNORMAL HIGH (ref 70–99)

## 2019-02-12 MED ORDER — METOPROLOL TARTRATE 5 MG/5ML IV SOLN: 5.0000 mg | Freq: Once | INTRAVENOUS | Status: AC

## 2019-02-12 NOTE — Progress Notes (Addendum)
Patient dressing change completed and patient turned in bed. Notified by Central Telemetry pt HR up to 202. Patient states she can feel her heart beating really fast; VS completed with HR 155, RR 34, BP 139/80. Sats maintained at 94-96%. Most recent WBC 9.6. Dr. Flonnie Overman Dhungel notified; new order to give 5mg  IV Lopressor and monitor HR. Also advised to give pain medication if patient in pain. MD did not need EKG completed. Patient has had no UOP overnight or yet this morning; minimal PO intake. MD aware of this as well. 5mg  IV Lopressor given with HR initially down to mid 80s. HR now sustaining in low 100s. Will continue to monitor.

## 2019-02-12 NOTE — Progress Notes (Addendum)
Patient reposition at this time; with no UOP this shift; MD was aware of this earlier during rounds but message sent at this time to update status. MEWS now yellow with last vital signs: temp 97.8, HR 122, RR 24, BP 142/91. Patient appears to be uncomfortable at this time but is refusing pain medication. Patient and daughter advised that at next reposition time, it will be time for her next dressing change and encouraged pain medication prior to that. No needs expressed at this time. Awaiting callback from MD with new orders if necessary. Will continue to monitor.   Update: message received from MD stating we would hold off inserting foley at this time and reassess in the morning.

## 2019-02-12 NOTE — Plan of Care (Signed)
Patient lying in bed this morning; alert and responsive. Refusing medications and pain medication at this time. No needs expressed. Will continue to monitor.

## 2019-02-12 NOTE — Progress Notes (Signed)
PROGRESS NOTE                                                                                                                                                                                                             Patient Demographics:    Sydney [name], is a 85 y.o. female, DOB - July 26, 1930, ZOX:[id number]  Admit date - 02/02/2019   Admitting Physician Orene Desanctis, DO  Outpatient Primary MD for the patient is [name], MD  LOS - 36    Chief Complaint  Patient presents with   Cough   Skin Ulcer       Brief Narrative   Please refer to hospitalist Dr. Teena Irani detailed summary from 10/13, in brief 85 year old female with C. difficile colitis status post transplant, hypertension, TIA, failure to thrive brought in by daughter with concern for sacral wound.  She had prolonged hospitalization from 8/24-10/6 for progressive generalized weakness, failure to thrive, weight loss.  Multiple subspecialties were involved in regards to her care.  Palliative care with goal for hospice was recommended but her daughter who is involved in her care has persistently refused. Patient had persistently poor appetite and daughter has consistently refused placement of NG tube or even a PEG tube.  Patient was discharged home after multiple discussion with the daughter that she would not show significant improvement.  Ethics committee was also involved during the previous hospital stay. She was discharged home with home health as daughter was resistant to SNF and hospice care.  Daughter brought her back to the hospital in 2 days reporting poor appetite and home health nurse concerned about her wheezing and sacral wound. During this hospitalization multiple subspecialties have been involved including surgery for her sacral wound (have started hydrotherapy), PCCM for pleural effusions (recommend this is likely from her vascular congestions).  Also  involved were ID for infected decubitus ulcer.  Neurology has seen patient previously for her critical illness myopathy.  Recommended for muscle biopsy but daughter had refused. Ethics committee was involved again.       Subjective:   No change from yesterday.  Gets tachycardic when being turned for dressing changes.   Assessment  & Plan :    Principal Problem:   Failure to thrive in adult Extensive work-up done previous hospitalization with multiple specialists involved.  Was discharged after extensive  hospital stay after being felt that patient had met maximum benefit along with involvement of ethics committee. Patient has severe deconditioning with suspicion for critical illness myopathy during last hospitalization.  Neurology had evaluated with consideration for muscle biopsy due to elevated ESR and CRP to rule out myositis but daughter refused.  CK and aldolase was normal.    Daughter has declined NG or PEG tube (she is not a candidate with any benefit).  Daughter has refused option for hospice as well. Patient intermittently tachycardic possibly with dehydration and severe deconditioning. Nutrition and PT following.  Seen by SLP and on dysphagia level 3 diet.  Requesting nursing involvement closely regarding her feeding. PT recommends home health upon discharge.  Continue Megace.  IV hydration as tolerated.  Acute respiratory failure with hypoxia Combination of volume overload with underlying diastolic dysfunction, severe malnutrition and possible intermittent aspiration. Continue flutter valve, incentive spirometry and antitussive. Dysphagia level 3 diet.  Pulmonary recommended comfort based approach (which daughter has been refusing) Continue supplemental oxygen.  Was given IV Flagyl with concern for aspiration as per ID recommendation and antibiotic stopped on 10/12. Continue I/O.  Sacral decubitus ulcer stage IV with abscess Appreciate surgery and wound care  evaluation. Patient continues to have discomfort over the sacral area and needs stronger pain medication however daughter refuses and only wants Tylenol. Was on empiric IV cefazolin, antibiotic switched to oral Keflex based on wound culture (GPC and gram variable rods). Discussed with surgery.  Wound has cleaned up significantly with hydrotherapy and Santyl.  Cleared for home health dressing changes.  Plan to teach daughter for dressing changes at home.  Home health RN arranged. Foley removed.  Will monitor for self voiding.  O2 sat stable on room air and does not need home O2.  Acute urinary retention Appears that patient has not voided by self.  Bladder scan showing <200 cc urine.  Will likely need Foley to be placed back in and discharged home on it.  Hypokalemia Replenished  Hypophosphatemia Being replenished  Oral thrush Nystatin suspension  Normocytic anemia Stable.  Continue to monitor  Still C. difficile colitis surgical stool transplantation Noted to have increased frequency of loose stools past few days.  Antibiotic currently narrowed to oral Keflex.  Will discontinue PPI  Acute kidney injury Secondary to dehydration and infection.  Currently resolved.  Acute toxic metabolic encephalopathy Patient seems alert and awake although poorly communicative.  Does not need acute imaging at present  History of TIA Continue aspirin and Plavix   Severe protein calorie malnutrition Nutrition following.  Continue supplement and encourage p.o. intake.  Has extremely poor p.o. intake and continues to refuse food frequently.  Major depressive disorder, recurrent, mild Seen by psych and recommend to continue Zoloft  Diarrhea on 10/14 PPI discontinued.  Resolved.  Code Status : Full code.  Discussed goals of care again with the daughter at bedside on 10/14.  Once her mother's medical condition to be restored back to her "normal"  Family Communication  : Daughter involved in care.     Disposition Plan  : Home possibly on 10/19 if underlying condition stable   Barriers For Discharge : Active symptoms  Consults  : Surgery, ID, ethics  Procedures  : Hydrotherapy  DVT Prophylaxis  :  Lovenox -  Lab Results  Component Value Date   PLT 176 02/10/2019    Antibiotics  :  Anti-infectives (From admission, onward)   Start     Dose/Rate Route Frequency Ordered Stop  02/09/19 1200  cephALEXin (KEFLEX) capsule 500 mg     500 mg Oral Every 12 hours 02/09/19 0952     02/06/19 1800  cephALEXin (KEFLEX) capsule 500 mg  Status:  Discontinued     500 mg Oral Every 6 hours 02/06/19 1500 02/09/19 0952   02/04/19 1400  metroNIDAZOLE (FLAGYL) IVPB 500 mg  Status:  Discontinued     500 mg 100 mL/hr over 60 Minutes Intravenous Every 8 hours 02/04/19 1343 02/07/19 1114   02/03/19 1500  ceFAZolin (ANCEF) IVPB 1 g/50 mL premix  Status:  Discontinued     1 g 100 mL/hr over 30 Minutes Intravenous Every 8 hours 02/03/19 1444 02/06/19 1500        Objective:   Vitals:   02/11/19 2201 02/12/19 0453 02/12/19 1004 02/12/19 1030  BP: 116/83 134/81 139/80 (!) 140/92  Pulse: (!) 110 (!) 103 (!) 155 (!) 110  Resp: 18 16 (!) 34 (!) 36  Temp: 97.8 F (36.6 C) 99.1 F (37.3 C)  98.4 F (36.9 C)  TempSrc: Oral Oral Oral Oral  SpO2: 98% 92% 94% 93%  Weight:      Height:        Wt Readings from Last 3 Encounters:  02/09/19 46.9 kg  01/25/19 60.4 kg  12/16/18 45.5 kg     Intake/Output Summary (Last 24 hours) at 02/12/2019 1056 Last data filed at 02/11/2019 1828 Gross per 24 hour  Intake --  Output 4 ml  Net -4 ml   Physical exam Elderly female, frail HEENT: Moist mucosa, Chest: Clear CVs: S1-S2 tachycardic, GI: Soft, nondistended, nontender, Musculoskeletal: Warm, no edema     Data Review:    CBC Recent Labs  Lab 02/06/19 0459 02/07/19 0509 02/08/19 0543 02/10/19 0813  WBC 15.1* 13.9* 11.2* 9.6  HGB 8.9* 8.9* 8.7* 8.3*  HCT 27.5* 27.2* 27.0* 26.0*  PLT  204 187 194 176  MCV 98.6 96.8 98.2 100.4*  MCH 31.9 31.7 31.6 32.0  MCHC 32.4 32.7 32.2 31.9  RDW 17.5* 17.8* 18.4* 18.9*  LYMPHSABS 0.4* 0.6* 0.4*  --   MONOABS 0.7 0.6 0.6  --   EOSABS 0.0 0.0 0.0  --   BASOSABS 0.0 0.0 0.0  --     Chemistries  Recent Labs  Lab 02/06/19 0459 02/07/19 0509 02/08/19 0543 02/10/19 0813 02/11/19 0746  NA 142 141 138 137 137  K 3.1* 3.0* 3.3* 3.1* 3.2*  CL 109 110 109 108 107  CO2 _0 21*  GLUCOSE 100* 100* 111* 91 106*  BUN _1 CREATININE 0.80 0.83 0.99 0.76 0.87  CALCIUM 7.9* 7.8* 7.7* 7.8* 8.0*  MG 2.0 1.9 1.8  --  1.9  AST _2 --   --   ALT _3 --   --   ALKPHOS 112 111 123  --   --   BILITOT 1.0 0.9 1.1  --   --    ------------------------------------------------------------------------------------------------------------------ No results for input(s): CHOL, HDL, LDLCALC, TRIG, CHOLHDL, LDLDIRECT in the last 72 hours.  Lab Results  Component Value Date   HGBA1C 5.8 (H) 10/03/2017   ------------------------------------------------------------------------------------------------------------------ No results for input(s): TSH, T4TOTAL, T3FREE, THYROIDAB in the last 72 hours.  Invalid input(s): FREET3 ------------------------------------------------------------------------------------------------------------------ No results for input(s): VITAMINB12, FOLATE, FERRITIN, TIBC, IRON, RETICCTPCT in the last 72 hours.  Coagulation profile No results for input(s): INR, PROTIME in the last 168 hours.  No results for input(s): DDIMER in the  last 72 hours.  Cardiac Enzymes No results for input(s): CKMB, TROPONINI, MYOGLOBIN in the last 168 hours.  Invalid input(s): CK ------------------------------------------------------------------------------------------------------------------ No results found for: BNP  Inpatient Medications  Scheduled Meds:  aspirin EC  81 mg Oral Daily   cephALEXin  500 mg Oral  Q12H   Chlorhexidine Gluconate Cloth  6 each Topical Daily   clopidogrel  75 mg Oral Daily   collagenase   Topical Daily   feeding supplement (ENSURE ENLIVE)  237 mL Oral BID BM   folic acid  1 mg Oral Daily   Gerhardt's butt cream  1 application Topical TID   guaiFENesin  1,200 mg Oral BID   latanoprost  1 drop Both Eyes QHS   megestrol  400 mg Oral TID   metoprolol tartrate  5 mg Intravenous Once    morphine injection  2 mg Intravenous Once   multivitamin with minerals  1 tablet Oral Daily   nutrition supplement (JUVEN)  1 packet Oral BID BM   nystatin  5 mL Oral QID   nystatin cream   Topical BID   phosphorus  500 mg Oral Once   sertraline  25 mg Oral Daily   Continuous Infusions:  dextrose 30 mL/hr at 02/10/19 0647   PRN Meds:.acetaminophen **OR** acetaminophen, ipratropium, ketorolac, levalbuterol, loperamide, traMADol  Micro Results Recent Results (from the past 240 hour(s))  SARS CORONAVIRUS 2 (TAT 6-24 HRS) Nasopharyngeal Nasopharyngeal Swab     Status: None   Collection Time: 02/02/19  6:43 PM   Specimen: Nasopharyngeal Swab  Result Value Ref Range Status   SARS Coronavirus 2 NEGATIVE NEGATIVE Final    Comment: (NOTE) SARS-CoV-2 target nucleic acids are NOT DETECTED. The SARS-CoV-2 RNA is generally detectable in upper and lower respiratory specimens during the acute phase of infection. Negative results do not preclude SARS-CoV-2 infection, do not rule out co-infections with other pathogens, and should not be used as the sole basis for treatment or other patient management decisions. Negative results must be combined with clinical observations, patient history, and epidemiological information. The expected result is Negative. Fact Sheet for Patients: SugarRoll.be Fact Sheet for Healthcare Providers: https://www.woods-mathews.com/ This test is not yet approved or cleared by the [place] FDA and  has  been authorized for detection and/or diagnosis of SARS-CoV-2 by FDA under an Emergency Use Authorization (EUA). This EUA will remain  in effect (meaning this test can be used) for the duration of the COVID-19 declaration under Section 56 4(b)(1) of the Act, 21 U.S.C. section 360bbb-3(b)(1), unless the authorization is terminated or revoked sooner. Performed at [facility], [place] [address], [place], [place] [address]   Aerobic Culture (superficial specimen)     Status: None   Collection Time: 02/03/19 11:46 AM   Specimen: Sacral; Wound  Result Value Ref Range Status   Specimen Description   Final    SACRAL Performed at [facility] [address], [place], [place] [address]    Special Requests   Final    NONE Performed at [facility], [place] [address], [place], [place] [address]    Gram Stain   Final    RARE WBC PRESENT,BOTH PMN AND MONONUCLEAR ABUNDANT GRAM POSITIVE COCCI IN PAIRS ABUNDANT GRAM VARIABLE ROD Performed at [facility], [place] [address], [place],  [address]    Culture   Final    ABUNDANT STREPTOCOCCUS ANGINOSIS FEW STAPHYLOCOCCUS AUREUS    Report Status 02/06/2019 FINAL  Final  Organism ID, Bacteria STREPTOCOCCUS ANGINOSIS  Final   Organism ID, Bacteria STAPHYLOCOCCUS AUREUS  Final      Susceptibility   Staphylococcus aureus - MIC*    CIPROFLOXACIN <=0.5 SENSITIVE Sensitive     ERYTHROMYCIN <=0.25 SENSITIVE Sensitive     GENTAMICIN <=0.5 SENSITIVE Sensitive     OXACILLIN 0.5 SENSITIVE Sensitive     TETRACYCLINE <=1 SENSITIVE Sensitive     VANCOMYCIN <=0.5 SENSITIVE Sensitive     TRIMETH/SULFA <=10 SENSITIVE Sensitive     CLINDAMYCIN <=0.25 SENSITIVE Sensitive     RIFAMPIN <=0.5 SENSITIVE Sensitive     Inducible Clindamycin NEGATIVE Sensitive     * FEW STAPHYLOCOCCUS AUREUS   Streptococcus anginosis - MIC*    PENICILLIN <=0.06 SENSITIVE Sensitive     ERYTHROMYCIN <=0.12 SENSITIVE Sensitive      LEVOFLOXACIN 0.5 SENSITIVE Sensitive     VANCOMYCIN 0.5 SENSITIVE Sensitive     * ABUNDANT STREPTOCOCCUS ANGINOSIS    Radiology Reports Dg Abd 1 View  Result Date: 01/17/2019 CLINICAL DATA:  85 year old female with a history of enteric tube placement EXAM: ABDOMEN - 1 VIEW; DG NASO G TUBE PLC W/FL-NO RAD COMPARISON:  January 13, 2019 FINDINGS: Limited plain film demonstrates post pyloric tube terminating in the 3/4 portion of the duodenum. IMPRESSION: Post pyloric feeding tube terminates in the 3/4 portion the duodenum. Electronically Signed   By: Corrie Mckusick D.O.   On: 01/17/2019 15:55   Ct Pelvis W Contrast  Result Date: 02/02/2019 CLINICAL DATA:  Sacral decubitus ulcer. EXAM: CT PELVIS WITH CONTRAST TECHNIQUE: Multidetector CT imaging of the pelvis was performed using the standard protocol following the bolus administration of intravenous contrast. CONTRAST:  58m OMNIPAQUE IOHEXOL 300 MG/ML  SOLN COMPARISON:  05/02/2018 FINDINGS: Urinary Tract:  No abnormality visualized. Bowel:  Unremarkable visualized pelvic bowel loops. Vascular/Lymphatic: Atheromatous arterial calcifications without aneurysm. No enlarged lymph nodes. Reproductive: Small uterus containing multiple small, densely calcified fibroids. No adnexal mass. Other: Small superficial soft tissue defect posteriorly to the left of the coccyx. This does not extend to the bone and there is no bone destruction or periosteal reaction. Slightly more inferiorly, there is a very small fluid and gas collection, measuring 1.0 x 0.4 cm on image number 35 series 2 and 1.9 cm in length on coronal image number 78. Also noted is diffuse subcutaneous edema. The previously demonstrated bilateral inguinal hernias are no longer seen. Musculoskeletal: No bone destruction or periosteal reaction. Lower lumbar spine degenerative changes. IMPRESSION: 1. Small soft tissue ulcer posteriorly to the left of the coccyx with a 1.9 x 1.0 x 0.4 cm abscess slightly more  inferiorly. 2. No evidence of osteomyelitis. 3. Diffuse subcutaneous edema. Electronically Signed   By: SClaudie ReveringM.D.   On: 02/02/2019 17:13   Mr Cervical Spine Wo Contrast  Result Date: 01/29/2019 CLINICAL DATA:  85year old female with ataxia. Unexplained lower extremity weakness, spasticity. EXAM: MRI CERVICAL SPINE WITHOUT CONTRAST TECHNIQUE: Multiplanar, multisequence MR imaging of the cervical spine was performed. No intravenous contrast was administered. COMPARISON:  Brain MRI 12/21/2018.  Neck CT 10/13/2017. FINDINGS: Alignment: Preserved cervical lordosis similar to the 2019 neck CT. No spondylolisthesis. Vertebrae: No marrow edema or evidence of acute osseous abnormality. Visualized bone marrow signal is within normal limits. Cord: Suboptimal spinal cord detail due to motion artifact despite repeated imaging attempts, but no convincing cervical or upper thoracic spinal cord signal abnormality. Fairly capacious spinal canal. Posterior Fossa, vertebral arteries, paraspinal tissues: Stable cervicomedullary junction and visible posterior fossa. Preserved  major vascular flow voids in the neck, the left vertebral artery appears mildly dominant. Disc levels: Mild for age cervical spine degeneration with no cervical or upper thoracic spinal stenosis. There is i generally mild cervical neural foraminal stenosis despite some levels of facet hypertrophy. There is up to moderate stenosis at the left C4 nerve level. IMPRESSION: Essentially negative for age MRI appearance of the cervical spine. No spinal stenosis, and no cord signal abnormality identified although cord detail is suboptimal due to motion. Electronically Signed   By: Genevie Ann M.D.   On: 01/29/2019 15:12   Mr Thoracic Spine Wo Contrast  Result Date: 01/29/2019 CLINICAL DATA:  85 year old female with ataxia. Unexplained lower extremity weakness, spasticity. EXAM: MRI THORACIC SPINE WITHOUT CONTRAST TECHNIQUE: Multiplanar, multisequence MR imaging  of the thoracic spine was performed. No intravenous contrast was administered. COMPARISON:  Cervical spine MRI today reported separately. FINDINGS: The examination had to be discontinued prior to completion due to patient pain. Only 1 set of axial images was obtained, and the study is intermittently degraded by motion. Limited cervical spine imaging:  Reported separately today. Thoracic spine segmentation:  Appears to be normal. Alignment: Mildly exaggerated thoracic kyphosis. No spondylolisthesis. Vertebrae: No marrow edema or evidence of acute osseous abnormality. Visualized bone marrow signal is within normal limits. Cord: No definite thoracic spinal cord signal abnormality is identified. The conus appears normal at T11-T12. There is questionable volume loss of the thoracic cord, somewhat generalized (e.g. So at the T2-T3 level series 22, image 8, and again at the T8-T9 level series 22, image 25). As in the cervical spine, the spinal canal is capacious. Paraspinal and other soft tissues: Small to moderate layering left left greater than right pleural effusions. Posterior paraspinal soft tissues appear within normal limits. Disc levels: Mild for age thoracic spine degeneration similar to that in the cervical spine. No spinal or foraminal stenosis. IMPRESSION: 1. Questionable generalized thoracic spinal cord volume loss, but no definite focal cord signal abnormality. Cord detail is suboptimal due to motion. 2. Mild for age thoracic spine degeneration and capacious spinal canal. 3. Left greater than right layering pleural effusions. Electronically Signed   By: Genevie Ann M.D.   On: 01/29/2019 15:25   US Renal  Result Date: 02/03/2019 CLINICAL DATA:  Urinary retention EXAM: RENAL / URINARY TRACT ULTRASOUND COMPLETE COMPARISON:  CT dated 01/06/2019 FINDINGS: Right Kidney: Renal measurements: 8.8 x 4.2 x 3.4 cm = volume: 64 mL. There is mild hydronephrosis. Left Kidney: Renal measurements: 8.8 x 3.9 x 2.7 cm = volume:  48.7 mL. There is mild hydronephrosis. Bladder: Both ureteral jets were noted. There is free fluid in the patient's pelvis. IMPRESSION: 1. Mild bilateral hydronephrosis. 2. Both ureteral jets were visualized. 3. Free fluid in the pelvis. Electronically Signed   By: Constance Holster M.D.   On: 02/03/2019 18:15   Mr Femur Left Wo Contrast  Result Date: 01/30/2019 CLINICAL DATA:  Myelopathy, acute or progressive. Evaluate for myositis. EXAM: MR OF THE LEFT FEMUR WITHOUT CONTRAST TECHNIQUE: Coronal T1 and inversion recovery imaging of both thighs was performed. The patient refused additional imaging. No intravenous contrast was administered. COMPARISON:  Pelvic CT 01/06/2019. FINDINGS: Bones/Joint/Cartilage There is no evidence of acute fracture, dislocation or femoral head avascular necrosis. There are mild degenerative changes of both hips, similar to previous CT. Mild degenerative changes are also present in both knees, greater on the right. No large joint effusions. Ligaments Not relevant for examination/indication. Muscles and Tendons There is generalized  soft tissue edema with mild T2 hyperintensity throughout the subcutaneous fat and muscles of both thighs. No focal fluid collection or muscular atrophy identified. No gross tendon abnormalities on coronal imaging. Soft tissues As above, generalized soft tissue edema throughout both thighs. No focal fluid collections are identified. IMPRESSION: 1. Limited study, consisting of only coronal imaging of both thighs. 2. Nonspecific generalized soft tissue edema involving the muscles and subcutaneous fat of both thighs. Cannot exclude myositis. No focal fluid collection or muscular atrophy identified. 3. No acute osseous findings. Mild degenerative changes of both hips and knees. Electronically Signed   By: Richardean Sale M.D.   On: 01/30/2019 14:24   Dg Chest Port 1 View  Result Date: 02/07/2019 CLINICAL DATA:  Shortness of breath. Rhonchi. EXAM: PORTABLE  CHEST 1 VIEW COMPARISON:  Chest x-ray 02/04/2019 and CT scan of the abdomen dated 01/06/2019 FINDINGS: Stable borderline cardiomegaly. Persistent consolidation/atelectasis/effusion at the left lung base. Clearing of the faint left perihilar infiltrate. Small right pleural effusion is slightly more prominent. The hazy infiltrate in the right upper lobe is slightly more extensive but less dense and the right perihilar infiltrate has almost resolved. Aortic atherosclerosis.  No acute bone abnormality. IMPRESSION: 1. Persistent consolidation/atelectasis at the left lung base. 2. Clearing of the left perihilar infiltrate. 3. Slightly more extensive but less dense infiltrate in the right upper lobe. 4. Small right pleural effusion, slightly increased. 5.  Aortic Atherosclerosis (ICD10-I70.0). Electronically Signed   By: Lorriane Shire M.D.   On: 02/07/2019 15:31   Dg Chest Port 1 View  Result Date: 02/04/2019 CLINICAL DATA:  Shortness of breath this morning. EXAM: PORTABLE CHEST 1 VIEW COMPARISON:  02/03/2019 FINDINGS: Stable enlarged cardiac silhouette and dense left lower lobe airspace opacity. No significant change in overall amount of patchy opacity in the right upper lobe and both perihilar regions. Stable small right pleural effusion and probable small left pleural effusion. Diffuse osteopenia. IMPRESSION: 1. No significant change in dense left lower lobe atelectasis or pneumonia. 2. No significant change in probable pneumonia in the right upper lobe and both perihilar regions. Alveolar pulmonary edema is less likely in the absence of pulmonary vascular congestion and interstitial pulmonary edema. 3. Stable small right pleural effusion and probable small left pleural effusion. 4. Stable cardiomegaly. Electronically Signed   By: Claudie Revering M.D.   On: 02/04/2019 11:28   Dg Chest Port 1 View  Result Date: 02/03/2019 CLINICAL DATA:  Wheezing. EXAM: PORTABLE CHEST 1 VIEW COMPARISON:  February 02, 2019 FINDINGS:  Enlarged cardiac silhouette. Calcific atherosclerotic disease of the aorta. Bilateral moderate in size pleural effusions. Worsening aeration of the lungs with focal airspace opacity in the right upper lobe. Mild pulmonary edema. Osseous structures are without acute abnormality. Soft tissues are grossly normal. IMPRESSION: 1. Bilateral moderate in size pleural effusions. 2. Worsening aeration of the lungs with focal airspace opacity in the right upper lobe. 3. Mild pulmonary edema. 4. Calcific atherosclerotic disease of the aorta. Electronically Signed   By: Fidela Salisbury M.D.   On: 02/03/2019 17:38   Dg Chest Port 1 View  Result Date: 02/02/2019 CLINICAL DATA:  Cough and tachypnea with tachycardia. EXAM: PORTABLE CHEST 1 VIEW COMPARISON:  01/23/2019 FINDINGS: Patient slightly rotated to the left. Lungs are adequately inflated demonstrate hazy bilateral perihilar opacification likely mild interstitial edema. Suggestion of small amount of bilateral pleural fluid. Left effusion improved compared to previous exams. Mild stable cardiomegaly. Remainder of the exam is unchanged. IMPRESSION: Evidence of mild interstitial  edema and small bilateral pleural effusions with significant interval improvement in the left effusion. Central lung infection is possible but less likely. Mild stable cardiomegaly. Electronically Signed   By: Marin Olp M.D.   On: 02/02/2019 16:04   Dg Chest Port 1 View  Result Date: 01/23/2019 CLINICAL DATA:  Cough. EXAM: PORTABLE CHEST 1 VIEW COMPARISON:  Radiograph 01/13/2019 FINDINGS: Enteric tube has been removed. Lower lung volumes from prior. Patient is rotated. Cardiomegaly is more prominent than on prior exam. Unchanged mediastinal contours with aortic atherosclerosis. Moderate bilateral pleural effusions and associated bibasilar opacities, left greater than right. No evidence of pulmonary edema. No confluent airspace disease. No pneumothorax. IMPRESSION: 1. Moderate bilateral  pleural effusions with associated bibasilar opacities, left greater than right, likely atelectasis. 2. Cardiomegaly is more prominent than on prior exam. Aortic Atherosclerosis (ICD10-I70.0). Electronically Signed   By: Keith Rake M.D.   On: 01/23/2019 19:30   Dg Addison Bailey G Tube Plc W/fl-no Rad  Result Date: 01/17/2019 CLINICAL DATA:  85 year old female with a history of enteric tube placement EXAM: ABDOMEN - 1 VIEW; DG NASO G TUBE PLC W/FL-NO RAD COMPARISON:  January 13, 2019 FINDINGS: Limited plain film demonstrates post pyloric tube terminating in the 3/4 portion of the duodenum. IMPRESSION: Post pyloric feeding tube terminates in the 3/4 portion the duodenum. Electronically Signed   By: Corrie Mckusick D.O.   On: 01/17/2019 15:55   US Liver Doppler  Result Date: 01/13/2019 CLINICAL DATA:  85 year old female with elevated liver enzymes. Evaluate for portal vein stenosis/occlusion. EXAM: DUPLEX ULTRASOUND OF LIVER TECHNIQUE: Color and duplex Doppler ultrasound was performed to evaluate the hepatic in-flow and out-flow vessels. COMPARISON:  Prior CT abdomen/pelvis 01/06/2019 FINDINGS: Liver: Normal parenchymal echogenicity. Normal hepatic contour without nodularity. No focal lesion, mass or intrahepatic biliary ductal dilatation. Portal Vein Velocities Main:  35 cm/sec with normal hepatopetal flow. Right:  23 cm/sec with normal hepatopetal flow. Left:  27 cm/sec with normal hepatopetal flow. Hepatic Vein Velocities Right:  16 cm/sec Middle:  27 cm/sec Left:  23 cm/sec IVC: Present and patent with normal respiratory phasicity. Hepatic Artery Velocity:  102 cm/sec Splenic Vein Velocity:  31 cm/sec Varices: Absent Ascites: Small volume ascites. Other: Bilateral pleural effusions are noted incidentally. IMPRESSION: 1. Patent and normal appearing portal and hepatic veins. 2. Small volume ascites. 3. Bilateral moderate pleural effusions. 4. Normal sonographic appearance of the liver. Electronically Signed   By:  Jacqulynn Cadet M.D.   On: 01/13/2019 16:00    Time Spent in minutes  25   Tobie Hellen M.D on 02/12/2019 at 10:56 AM  Between 7am to 7pm - Pager - [phone]  After 7pm go to [email or web address] - password [facility]  [facility] -  Office  [phone]

## 2019-02-13 DIAGNOSIS — R531 Weakness: Secondary | ICD-10-CM

## 2019-02-13 DIAGNOSIS — L98429 Non-pressure chronic ulcer of back with unspecified severity: Secondary | ICD-10-CM

## 2019-02-13 DIAGNOSIS — E871 Hypo-osmolality and hyponatremia: Secondary | ICD-10-CM

## 2019-02-13 DIAGNOSIS — L8993 Pressure ulcer of unspecified site, stage 3: Secondary | ICD-10-CM | POA: Diagnosis present

## 2019-02-13 DIAGNOSIS — E860 Dehydration: Secondary | ICD-10-CM

## 2019-02-13 DIAGNOSIS — E43 Unspecified severe protein-calorie malnutrition: Secondary | ICD-10-CM

## 2019-02-13 LAB — GLUCOSE, CAPILLARY
Glucose-Capillary: 106 mg/dL — ABNORMAL HIGH (ref 70–99)
Glucose-Capillary: 107 mg/dL — ABNORMAL HIGH (ref 70–99)
Glucose-Capillary: 110 mg/dL — ABNORMAL HIGH (ref 70–99)

## 2019-02-13 MED ORDER — ACETAMINOPHEN 650 MG RE SUPP: 650.0000 mg | Freq: Four times a day (QID) | RECTAL | 0 refills | Status: AC | PRN

## 2019-02-13 MED ORDER — JUVEN PO PACK: 1.0000 | PACK | Freq: Two times a day (BID) | ORAL | 0 refills | Status: DC

## 2019-02-13 MED ORDER — CEPHALEXIN 500 MG PO CAPS: 500.0000 mg | ORAL_CAPSULE | Freq: Two times a day (BID) | ORAL | 0 refills | Status: DC

## 2019-02-13 MED ORDER — JUVEN PO PACK: 1.0000 | PACK | Freq: Two times a day (BID) | ORAL | 0 refills | Status: AC

## 2019-02-13 MED ORDER — CEPHALEXIN 500 MG PO CAPS: 500.0000 mg | ORAL_CAPSULE | Freq: Two times a day (BID) | ORAL | 0 refills | Status: AC

## 2019-02-13 MED ORDER — ENSURE ENLIVE PO LIQD: 237.0000 mL | Freq: Two times a day (BID) | ORAL | 12 refills | Status: AC

## 2019-02-13 NOTE — Progress Notes (Signed)
[facility] called for transport. Stacey Drain

## 2019-02-13 NOTE — Progress Notes (Signed)
Pt's daughter requested "step by step" instructions of dressing changes, which had already been provided.  Requested that this RN review.  This RN reviewed and the following instructions were provided for at home dressing changes:  1.  Gently remove outer foam dressing. 2. Remove packing inside wound.  3.Cleanse wound with saline.  4. Apply Santyl to wound. (verbally instructed daughter how to apply) 5. Cut a small piece of kerlix to pack the wound, moisten with saline prior to packing.  (demonstrated to daughter how to pack wound)  6. Cover packed wound with gauze to cover.  7. Apply foam dressing.   This RN explained to daughter that dressing change techniques  can vary from RN to RN, however provider (MD) will order what dressing changes will be needed.   Daughter verbalized multiple times to this RN that she had never performed a dressing change before.  This RN provided support and tried to answer all questions.   Stacey Drain

## 2019-02-13 NOTE — TOC Transition Note (Signed)
Transition of Care [facility]) - CM/SW Discharge Note   Patient Details  Name: Sydney [name] MRN: [id number] Date of Birth: 1930-11-30  Transition of Care (TOC) CM/SW Contact:  Joaquin Courts, RN Phone Number: 02/13/2019, 11:31 AM   Clinical Narrative:    [facility] notified of planned dc home today. Agency to provide [facility] PT, OT, and RN services.   Final next level of care: [place] Barriers to Discharge: No Barriers Identified   Patient Goals and CMS Choice Patient states their goals for this hospitalization and ongoing recovery are:: prefer she goes home CMS Medicare.gov Compare Post Acute Care list provided to:: Patient Represenative (must comment)([name] -daughter) Choice offered to / list presented to : Adult Children  Discharge Placement                       Discharge Plan and Services In-house Referral: Clinical Social Work Discharge Planning Services: CM Consult Post Acute Care Choice: Durable Medical Equipment, Home Health                    HH Arranged: RN, PT, Nurse's Aide [facility] Agency: [place] ([facility]) Date [place]: 02/02/19 Time [place]: [phone] Representative spoke with at [place]: [place] (SDOH) Interventions     Readmission Risk Interventions Readmission Risk Prevention Plan 02/13/2019 01/30/2019  Transportation Screening Complete Complete  PCP or Specialist Appt within 3-5 Days Complete -  [place] or [place] Complete -  Social Work Consult for [place] Planning/Counseling Complete -  Palliative Care Screening Not Applicable -  Medication Review Press photographer) Complete Complete  PCP or Specialist appointment within 3-5 days of discharge - Complete  [place] or [place] - Complete  SW Recovery Care/Counseling Consult - Complete  [place] - Patient Refused   Some recent data might be hidden

## 2019-02-13 NOTE — Progress Notes (Signed)
    Home health agencies that serve [phone].        [place] Quality of Patient Care Rating Patient Survey Summary Rating  [facility] [phone] 3 out of 5 stars 5 out of [place] [phone] 3 out of 5 stars 4 out of [place] [phone] 4  out of 5 stars 3 out of [place] [phone] 4 out of 5 stars 4 out of [place] [phone] 4 out of 5 stars 4 out of 5 stars  [facility] [place] [phone] 3  out of 5 stars 4 out of [place] [phone] 3 out of 5 stars 4 out of 5 stars  [facility] [phone] 4 out of 5 stars [facility] [place] [phone] 3 out of 5 stars 4 out of 5 stars  [facility] [phone] 3  out of 5 stars 3 out of [place] [phone] 3  out of 5 stars 4 out of [place] [phone] 3  out of 5 stars 3 out of [place] [phone] 4  out of 5 stars 3 out of [place] [phone] 4  out of 5 stars 2 out of [place] number Footnote as displayed on [place]  1 This agency provides services under a federal waiver program to non-traditional, chronic long term population.  2 This agency provides services to a special needs population.  3 Not Available.  4 The number of patient episodes for this measure is too small to report.  5 This measure currently does not have data or provider has been certified/recertified for less than 6 months.  6 The national average for this measure is not provided because of state-to-state differences in data collection.  7 Medicare is not displaying rates for this measure  for any home health agency, because of an issue with the data.  8 There were problems with the data and they are being corrected.  9 Zero, or very few, patients met the survey's rules for inclusion. The scores shown, if any, reflect a very small number of surveys and may not accurately tell how an agency is doing.  10 Survey results are based on less than 12 months of data.  11 Fewer than 70 patients completed the survey. Use the scores shown, if any, with caution as the number of surveys may be too low to accurately tell how an agency is doing.  12 No survey results are available for this period.  13 Data suppressed by CMS for one or more quarters.

## 2019-02-13 NOTE — Plan of Care (Signed)
  Problem: Education: ?Goal: Knowledge of General Education information will improve ?Description: Including pain rating scale, medication(s)/side effects and non-pharmacologic comfort measures ?Outcome: Progressing ?  ?Problem: Health Behavior/Discharge Planning: ?Goal: Ability to manage health-related needs will improve ?Outcome: Progressing ?  ?Problem: Clinical Measurements: ?Goal: Ability to maintain clinical measurements within normal limits will improve ?Outcome: Progressing ?Goal: Will remain free from infection ?Outcome: Progressing ?  ?Problem: Activity: ?Goal: Risk for activity intolerance will decrease ?Outcome: Progressing ?  ?Problem: Pain Managment: ?Goal: General experience of comfort will improve ?Outcome: Progressing ?  ?

## 2019-02-13 NOTE — Progress Notes (Signed)
[facility] to evaluate if patient would benefit from their services.

## 2019-02-13 NOTE — Discharge Instructions (Signed)
Normal saline wet to dry dressing changes to sacral wound 1-2x a day Foam - Lift dressing to assess site every shift;Moist to dry;Gauze (Comment) 4x4 with santyl for packing ( open in up thin and gently pack w/Qtip)

## 2019-02-13 NOTE — Discharge Summary (Signed)
Physician Discharge Summary  Sydney [name] IOX:[id number] DOB: Dec 17, 1930 DOA: 02/02/2019  PCP: [name], MD  Admit date: 02/02/2019 Discharge date: 02/13/2019  Admitted From: Home Disposition: Home  Recommendations for Outpatient Follow-up:  1. Follow-up with PCP and wound care clinic 2. Patient will complete 2 weeks of antibiotics after 10/22  Home Health: RN, PT, OT Equipment/Devices: Has hospital bed, dressing supplies  Discharge Condition: Guarded CODE STATUS: Full code Diet recommendation: Dysphagia level 3   Discharge Diagnoses:  Principal Problem:   Failure to thrive in adult   Active Problems:   Sacral ulcer ([place])   Dehydration with hyponatremia   Protein calorie malnutrition ([place])   Ataxia   General weakness   MDD (major depressive disorder)   SOB (shortness of breath)   Wheezing   TIA (transient ischemic attack)   PVD (peripheral vascular disease) ([facility])   Pressure ulcer with full thickness skin loss involving damage or necrosis of subcutaneous tissue ([place])   Brief narrative/HPI Brief Narrative   Please refer to hospitalist Dr. Teena Irani detailed summary from 10/13, in brief 85 year old female with C. difficile colitis status post transplant, hypertension, TIA, failure to thrive brought in by daughter with concern for sacral wound.  She had prolonged hospitalization from 8/24-10/6 for progressive generalized weakness, failure to thrive, weight loss.  Multiple subspecialties were involved in regards to her care.  Palliative care with goal for hospice was recommended but her daughter who is involved in her care has persistently refused. Patient had persistently poor appetite and daughter has consistently refused placement of NG tube or even a PEG tube.  Patient was discharged home after multiple discussion with the daughter that she would not show significant improvement.  Ethics committee was also involved during the previous hospital stay. She was discharged  home with home health as daughter was resistant to SNF and hospice care.  Daughter brought her back to the hospital in 2 days reporting poor appetite and home health nurse concerned about her wheezing and sacral wound. During this hospitalization multiple subspecialties have been involved including surgery for her sacral wound (have started hydrotherapy), PCCM for pleural effusions (recommend this is likely from her vascular congestions).  Also involved were ID for infected decubitus ulcer.  Neurology has seen patient previously for her critical illness myopathy.  Recommended for muscle biopsy but daughter had refused. Ethics committee was involved again.  Principal Problem:   Failure to thrive in adult Extensive work-up done previous hospitalization with multiple specialists involved.  Was discharged after extensive hospital stay after being felt that patient had met maximum benefit along with involvement of ethics committee. Patient has severe deconditioning with suspicion for critical illness myopathy during last hospitalization.  Neurology had evaluated with consideration for muscle biopsy due to elevated ESR and CRP to rule out myositis but daughter refused.  CK and aldolase was normal.    Daughter has declined NG or PEG tube (she is not a candidate with any benefit).  Daughter has refused option for hospice as well. Patient intermittently tachycardic possibly with dehydration and severe deconditioning. Nutrition and PT following.  Seen by SLP and on dysphagia level 3 diet.  Requesting nursing involvement closely regarding her feeding. PT recommends home health upon discharge.  Continue Megace.   Patient has been seen by palliative care and ethics committee as well.  She has extremely poor functional status with progressive decline in function.  Daughter has repeatedly refused palliative care and hospice which patient is eligible and wished for full  scope of treatment.  Acute respiratory  failure with hypoxia Combination of volume overload with underlying diastolic dysfunction, severe malnutrition and possible intermittent aspiration. Given incentive spirometry, antitussives and flutter valve.  Also received IV Flagyl for 5 days with concern for aspiration. Patient maintaining O2 sat on room air now.   Sacral decubitus ulcer stage IV with abscess Appreciate surgery and wound care evaluation. Patient continues to have discomfort over the sacral area and needs stronger pain medication however daughter refuses and only wants Tylenol. Was on empiric IV cefazolin, antibiotic switched to oral Keflex based on wound culture (GPC and gram variable rods).  We will treat her for total 2 weeks (completed on 10/22).  ID consulted appreciated. Per surgery the wound has cleaned up significantly with hydrotherapy and Santyl.  Cleared for home health dressing changes.    Daughter has been taught about dressing changes at home (twice daily, see instructions above)    Urinary retention Patient had Foley placed during hospital stay.  Foley removed and has been voiding without difficulty.  Hypokalemia/hypophosphatemia Replenished   Oral thrush Received nystatin suspension.  Normocytic anemia Stable.  Continue to monitor  Still C. difficile colitis surgical stool transplantation Noted to have increased frequency of loose stools past few days.  Antibiotic currently narrowed to oral Keflex.    PPI discontinued.  Acute kidney injury Secondary to dehydration and infection.  Resolved  Acute toxic metabolic encephalopathy Currently resolved.  Alert and awake although poorly communicative.    History of TIA Continue aspirin and Plavix   Severe protein calorie malnutrition Nutrition following.    Added supplement upon discharge.  Extremely poor p.o. intake  Major depressive disorder, recurrent, mild Seen by psych and recommend to continue Zoloft.  Patient has not been taking  the meds and I have discontinued it    Diarrhea on 10/14 PPI discontinued.  Resolved.    Family Communication  : Daughter involved in care.    Disposition Plan  : Home.  Patient is very likely to return to the hospital with a new infection, dehydration, respiratory or renal compromise.  Consults  : Surgery, ID, ethics  Procedures  : Hydrotherapy  Discharge Instructions   Allergies as of 02/13/2019      Reactions   Demerol [meperidine] Other (See Comments)   Excessive sweating, cramping   Other    No Antibiotic without consultation with her primary physician.   Codeine Rash   Sulfa Antibiotics Itching, Other (See Comments)   Reaction unknown   Tomato Itching      Medication List    STOP taking these medications   nystatin 100000 UNIT/ML suspension Commonly known as: MYCOSTATIN     TAKE these medications   cephALEXin 500 MG capsule Commonly known as: KEFLEX Take 1 capsule (500 mg total) by mouth every 12 (twelve) hours for 3 days.   cholecalciferol 1000 units tablet Commonly known as: VITAMIN D Take 1,000 Units by mouth daily.   clopidogrel 75 MG tablet Commonly known as: PLAVIX Take 75 mg by mouth daily.   Ecotrin Low Strength 81 MG EC tablet Generic drug: aspirin Take 81 mg by mouth daily. Swallow whole.   feeding supplement (ENSURE ENLIVE) Liqd Take 237 mLs by mouth 2 (two) times daily between meals.   nutrition supplement (JUVEN) Pack Take 1 packet by mouth 2 (two) times daily between meals.   feeding supplement (PRO-STAT SUGAR FREE 64) Liqd Take 30 mLs by mouth 2 (two) times daily with a meal.   folic  acid 1 MG tablet Commonly known as: FOLVITE Take 1 mg by mouth daily.   Gerhardt's butt cream Crea Apply 1 application topically 3 (three) times daily. Apply barrier cream to buttocks/sacrum TID and PRN with each incontinent cleaning session   latanoprost 0.005 % ophthalmic solution Commonly known as: XALATAN Place 1 drop into both eyes at  bedtime.   loperamide 2 MG capsule Commonly known as: IMODIUM Take 1 capsule (2 mg total) by mouth as needed for diarrhea or loose stools.   megestrol 400 MG/10ML suspension Commonly known as: MEGACE Take 10 mLs (400 mg total) by mouth 3 (three) times daily.   mirtazapine 45 MG disintegrating tablet Commonly known as: REMERON SOL-TAB Take 1 tablet (45 mg total) by mouth at bedtime.   multivitamin with minerals Tabs tablet Take 1 tablet by mouth daily.   nystatin powder Commonly known as: MYCOSTATIN/NYSTOP Apply topically 2 (two) times daily. Apply to affected area on the skin folds   pantoprazole 20 MG tablet Commonly known as: PROTONIX Take 20 mg by mouth daily.   traMADol 50 MG tablet Commonly known as: ULTRAM Take 0.5 tablets (25 mg total) by mouth every 12 (twelve) hours as needed for moderate pain.      Follow-up Information    [facility]             . Schedule an appointment as soon as possible for a visit in 2 week(s).   Why: Call to schedule an appointment for a wound check Contact information: [address] [phone]       [name], MD Follow up in 1 week(s).   Specialty: Internal Medicine Contact information: [address] [place] [address] [phone]          Allergies  Allergen Reactions  . Demerol [Meperidine] Other (See Comments)    Excessive sweating, cramping  . Other     No Antibiotic without consultation with her primary physician.  . Codeine Rash  . Sulfa Antibiotics Itching and Other (See Comments)    Reaction unknown  . Tomato Itching       Procedures/Studies: Dg Abd 1 View  Result Date: 01/17/2019 CLINICAL DATA:  85 year old female with a history of enteric tube placement EXAM: ABDOMEN - 1 VIEW; DG NASO G TUBE PLC W/FL-NO RAD COMPARISON:  January 13, 2019 FINDINGS: Limited plain film demonstrates post pyloric  tube terminating in the 3/4 portion of the duodenum. IMPRESSION: Post pyloric feeding tube terminates in the 3/4 portion the duodenum. Electronically Signed   By: Corrie Mckusick D.O.   On: 01/17/2019 15:55   Ct Pelvis W Contrast  Result Date: 02/02/2019 CLINICAL DATA:  Sacral decubitus ulcer. EXAM: CT PELVIS WITH CONTRAST TECHNIQUE: Multidetector CT imaging of the pelvis was performed using the standard protocol following the bolus administration of intravenous contrast. CONTRAST:  64m OMNIPAQUE IOHEXOL 300 MG/ML  SOLN COMPARISON:  05/02/2018 FINDINGS: Urinary Tract:  No abnormality visualized. Bowel:  Unremarkable visualized pelvic bowel loops. Vascular/Lymphatic: Atheromatous arterial calcifications without aneurysm. No enlarged lymph nodes. Reproductive: Small uterus containing multiple small, densely calcified fibroids. No adnexal mass. Other: Small superficial soft tissue defect posteriorly to the left of the coccyx. This does not extend to the bone and there is no bone destruction or periosteal reaction. Slightly more inferiorly, there is a very small fluid and gas collection, measuring 1.0 x 0.4 cm on image number 35 series 2 and 1.9 cm  in length on coronal image number 78. Also noted is diffuse subcutaneous edema. The previously demonstrated bilateral inguinal hernias are no longer seen. Musculoskeletal: No bone destruction or periosteal reaction. Lower lumbar spine degenerative changes. IMPRESSION: 1. Small soft tissue ulcer posteriorly to the left of the coccyx with a 1.9 x 1.0 x 0.4 cm abscess slightly more inferiorly. 2. No evidence of osteomyelitis. 3. Diffuse subcutaneous edema. Electronically Signed   By: Claudie Revering M.D.   On: 02/02/2019 17:13   Mr Cervical Spine Wo Contrast  Result Date: 01/29/2019 CLINICAL DATA:  85 year old female with ataxia. Unexplained lower extremity weakness, spasticity. EXAM: MRI CERVICAL SPINE WITHOUT CONTRAST TECHNIQUE: Multiplanar, multisequence MR imaging of the  cervical spine was performed. No intravenous contrast was administered. COMPARISON:  Brain MRI 12/21/2018.  Neck CT 10/13/2017. FINDINGS: Alignment: Preserved cervical lordosis similar to the 2019 neck CT. No spondylolisthesis. Vertebrae: No marrow edema or evidence of acute osseous abnormality. Visualized bone marrow signal is within normal limits. Cord: Suboptimal spinal cord detail due to motion artifact despite repeated imaging attempts, but no convincing cervical or upper thoracic spinal cord signal abnormality. Fairly capacious spinal canal. Posterior Fossa, vertebral arteries, paraspinal tissues: Stable cervicomedullary junction and visible posterior fossa. Preserved major vascular flow voids in the neck, the left vertebral artery appears mildly dominant. Disc levels: Mild for age cervical spine degeneration with no cervical or upper thoracic spinal stenosis. There is i generally mild cervical neural foraminal stenosis despite some levels of facet hypertrophy. There is up to moderate stenosis at the left C4 nerve level. IMPRESSION: Essentially negative for age MRI appearance of the cervical spine. No spinal stenosis, and no cord signal abnormality identified although cord detail is suboptimal due to motion. Electronically Signed   By: Genevie Ann M.D.   On: 01/29/2019 15:12   Mr Thoracic Spine Wo Contrast  Result Date: 01/29/2019 CLINICAL DATA:  85 year old female with ataxia. Unexplained lower extremity weakness, spasticity. EXAM: MRI THORACIC SPINE WITHOUT CONTRAST TECHNIQUE: Multiplanar, multisequence MR imaging of the thoracic spine was performed. No intravenous contrast was administered. COMPARISON:  Cervical spine MRI today reported separately. FINDINGS: The examination had to be discontinued prior to completion due to patient pain. Only 1 set of axial images was obtained, and the study is intermittently degraded by motion. Limited cervical spine imaging:  Reported separately today. Thoracic spine  segmentation:  Appears to be normal. Alignment: Mildly exaggerated thoracic kyphosis. No spondylolisthesis. Vertebrae: No marrow edema or evidence of acute osseous abnormality. Visualized bone marrow signal is within normal limits. Cord: No definite thoracic spinal cord signal abnormality is identified. The conus appears normal at T11-T12. There is questionable volume loss of the thoracic cord, somewhat generalized (e.g. So at the T2-T3 level series 22, image 8, and again at the T8-T9 level series 22, image 25). As in the cervical spine, the spinal canal is capacious. Paraspinal and other soft tissues: Small to moderate layering left left greater than right pleural effusions. Posterior paraspinal soft tissues appear within normal limits. Disc levels: Mild for age thoracic spine degeneration similar to that in the cervical spine. No spinal or foraminal stenosis. IMPRESSION: 1. Questionable generalized thoracic spinal cord volume loss, but no definite focal cord signal abnormality. Cord detail is suboptimal due to motion. 2. Mild for age thoracic spine degeneration and capacious spinal canal. 3. Left greater than right layering pleural effusions. Electronically Signed   By: Genevie Ann M.D.   On: 01/29/2019 15:25   US Renal  Result Date: 02/03/2019  CLINICAL DATA:  Urinary retention EXAM: RENAL / URINARY TRACT ULTRASOUND COMPLETE COMPARISON:  CT dated 01/06/2019 FINDINGS: Right Kidney: Renal measurements: 8.8 x 4.2 x 3.4 cm = volume: 64 mL. There is mild hydronephrosis. Left Kidney: Renal measurements: 8.8 x 3.9 x 2.7 cm = volume: 48.7 mL. There is mild hydronephrosis. Bladder: Both ureteral jets were noted. There is free fluid in the patient's pelvis. IMPRESSION: 1. Mild bilateral hydronephrosis. 2. Both ureteral jets were visualized. 3. Free fluid in the pelvis. Electronically Signed   By: Constance Holster M.D.   On: 02/03/2019 18:15   Mr Femur Left Wo Contrast  Result Date: 01/30/2019 CLINICAL DATA:   Myelopathy, acute or progressive. Evaluate for myositis. EXAM: MR OF THE LEFT FEMUR WITHOUT CONTRAST TECHNIQUE: Coronal T1 and inversion recovery imaging of both thighs was performed. The patient refused additional imaging. No intravenous contrast was administered. COMPARISON:  Pelvic CT 01/06/2019. FINDINGS: Bones/Joint/Cartilage There is no evidence of acute fracture, dislocation or femoral head avascular necrosis. There are mild degenerative changes of both hips, similar to previous CT. Mild degenerative changes are also present in both knees, greater on the right. No large joint effusions. Ligaments Not relevant for examination/indication. Muscles and Tendons There is generalized soft tissue edema with mild T2 hyperintensity throughout the subcutaneous fat and muscles of both thighs. No focal fluid collection or muscular atrophy identified. No gross tendon abnormalities on coronal imaging. Soft tissues As above, generalized soft tissue edema throughout both thighs. No focal fluid collections are identified. IMPRESSION: 1. Limited study, consisting of only coronal imaging of both thighs. 2. Nonspecific generalized soft tissue edema involving the muscles and subcutaneous fat of both thighs. Cannot exclude myositis. No focal fluid collection or muscular atrophy identified. 3. No acute osseous findings. Mild degenerative changes of both hips and knees. Electronically Signed   By: Richardean Sale M.D.   On: 01/30/2019 14:24   Dg Chest Port 1 View  Result Date: 02/07/2019 CLINICAL DATA:  Shortness of breath. Rhonchi. EXAM: PORTABLE CHEST 1 VIEW COMPARISON:  Chest x-ray 02/04/2019 and CT scan of the abdomen dated 01/06/2019 FINDINGS: Stable borderline cardiomegaly. Persistent consolidation/atelectasis/effusion at the left lung base. Clearing of the faint left perihilar infiltrate. Small right pleural effusion is slightly more prominent. The hazy infiltrate in the right upper lobe is slightly more extensive but  less dense and the right perihilar infiltrate has almost resolved. Aortic atherosclerosis.  No acute bone abnormality. IMPRESSION: 1. Persistent consolidation/atelectasis at the left lung base. 2. Clearing of the left perihilar infiltrate. 3. Slightly more extensive but less dense infiltrate in the right upper lobe. 4. Small right pleural effusion, slightly increased. 5.  Aortic Atherosclerosis (ICD10-I70.0). Electronically Signed   By: Lorriane Shire M.D.   On: 02/07/2019 15:31   Dg Chest Port 1 View  Result Date: 02/04/2019 CLINICAL DATA:  Shortness of breath this morning. EXAM: PORTABLE CHEST 1 VIEW COMPARISON:  02/03/2019 FINDINGS: Stable enlarged cardiac silhouette and dense left lower lobe airspace opacity. No significant change in overall amount of patchy opacity in the right upper lobe and both perihilar regions. Stable small right pleural effusion and probable small left pleural effusion. Diffuse osteopenia. IMPRESSION: 1. No significant change in dense left lower lobe atelectasis or pneumonia. 2. No significant change in probable pneumonia in the right upper lobe and both perihilar regions. Alveolar pulmonary edema is less likely in the absence of pulmonary vascular congestion and interstitial pulmonary edema. 3. Stable small right pleural effusion and probable small left pleural effusion.  4. Stable cardiomegaly. Electronically Signed   By: Claudie Revering M.D.   On: 02/04/2019 11:28   Dg Chest Port 1 View  Result Date: 02/03/2019 CLINICAL DATA:  Wheezing. EXAM: PORTABLE CHEST 1 VIEW COMPARISON:  February 02, 2019 FINDINGS: Enlarged cardiac silhouette. Calcific atherosclerotic disease of the aorta. Bilateral moderate in size pleural effusions. Worsening aeration of the lungs with focal airspace opacity in the right upper lobe. Mild pulmonary edema. Osseous structures are without acute abnormality. Soft tissues are grossly normal. IMPRESSION: 1. Bilateral moderate in size pleural effusions. 2. Worsening  aeration of the lungs with focal airspace opacity in the right upper lobe. 3. Mild pulmonary edema. 4. Calcific atherosclerotic disease of the aorta. Electronically Signed   By: Fidela Salisbury M.D.   On: 02/03/2019 17:38   Dg Chest Port 1 View  Result Date: 02/02/2019 CLINICAL DATA:  Cough and tachypnea with tachycardia. EXAM: PORTABLE CHEST 1 VIEW COMPARISON:  01/23/2019 FINDINGS: Patient slightly rotated to the left. Lungs are adequately inflated demonstrate hazy bilateral perihilar opacification likely mild interstitial edema. Suggestion of small amount of bilateral pleural fluid. Left effusion improved compared to previous exams. Mild stable cardiomegaly. Remainder of the exam is unchanged. IMPRESSION: Evidence of mild interstitial edema and small bilateral pleural effusions with significant interval improvement in the left effusion. Central lung infection is possible but less likely. Mild stable cardiomegaly. Electronically Signed   By: Marin Olp M.D.   On: 02/02/2019 16:04   Dg Chest Port 1 View  Result Date: 01/23/2019 CLINICAL DATA:  Cough. EXAM: PORTABLE CHEST 1 VIEW COMPARISON:  Radiograph 01/13/2019 FINDINGS: Enteric tube has been removed. Lower lung volumes from prior. Patient is rotated. Cardiomegaly is more prominent than on prior exam. Unchanged mediastinal contours with aortic atherosclerosis. Moderate bilateral pleural effusions and associated bibasilar opacities, left greater than right. No evidence of pulmonary edema. No confluent airspace disease. No pneumothorax. IMPRESSION: 1. Moderate bilateral pleural effusions with associated bibasilar opacities, left greater than right, likely atelectasis. 2. Cardiomegaly is more prominent than on prior exam. Aortic Atherosclerosis (ICD10-I70.0). Electronically Signed   By: Keith Rake M.D.   On: 01/23/2019 19:30   Dg [place]ison Bailey G Tube Plc W/fl-no Rad  Result Date: 01/17/2019 CLINICAL DATA:  85 year old female with a history of enteric  tube placement EXAM: ABDOMEN - 1 VIEW; DG NASO G TUBE PLC W/FL-NO RAD COMPARISON:  January 13, 2019 FINDINGS: Limited plain film demonstrates post pyloric tube terminating in the 3/4 portion of the duodenum. IMPRESSION: Post pyloric feeding tube terminates in the 3/4 portion the duodenum. Electronically Signed   By: Corrie Mckusick D.O.   On: 01/17/2019 15:55       Subjective: No overnight events.  Continues to have poor p.o. intake.  Discharge Exam: Vitals:   02/13/19 0518 02/13/19 0734  BP: (!) 144/90   Pulse: (!) 121 (!) 106  Resp: 18   Temp: 98 F (36.7 C)   SpO2: 95%    Vitals:   02/12/19 2116 02/13/19 0104 02/13/19 0518 02/13/19 0734  BP: (!) 131/91 (!) 139/92 (!) 144/90   Pulse: (!) 120 (!) 118 (!) 121 (!) 106  Resp: '16 16 18   ' Temp: 98.1 F (36.7 C) 98.6 F (37 C) 98 F (36.7 C)   TempSrc: Oral Oral Oral   SpO2: 94% 93% 95%   Weight:      Height:       Elderly female, frail HEENT: Moist mucosa, Chest: Clear CVs: S1-S2 tachycardic, GI: Soft, nondistended, nontender, Musculoskeletal:  Warm, no edema CNS: Alert and awake, poorly communicative     The results of significant diagnostics from this hospitalization (including imaging, microbiology, ancillary and laboratory) are listed below for reference.     Microbiology: Recent Results (from the past 240 hour(s))  Aerobic Culture (superficial specimen)     Status: None   Collection Time: 02/03/19 11:46 AM   Specimen: Sacral; Wound  Result Value Ref Range Status   Specimen Description   Final    SACRAL Performed at [facility] [address] [place], [place] [address]    Special Requests   Final    NONE Performed at [facility], [place] [address] [place], [place] [address]    Gram Stain   Final    RARE WBC PRESENT,BOTH PMN AND MONONUCLEAR ABUNDANT GRAM POSITIVE COCCI IN PAIRS ABUNDANT GRAM VARIABLE ROD Performed at [facility], [place] [address]  [place], [place] [address]    Culture   Final    ABUNDANT STREPTOCOCCUS ANGINOSIS FEW STAPHYLOCOCCUS AUREUS    Report Status 02/06/2019 FINAL  Final   Organism ID, Bacteria STREPTOCOCCUS ANGINOSIS  Final   Organism ID, Bacteria STAPHYLOCOCCUS AUREUS  Final      Susceptibility   Staphylococcus aureus - MIC*    CIPROFLOXACIN <=0.5 SENSITIVE Sensitive     ERYTHROMYCIN <=0.25 SENSITIVE Sensitive     GENTAMICIN <=0.5 SENSITIVE Sensitive     OXACILLIN 0.5 SENSITIVE Sensitive     TETRACYCLINE <=1 SENSITIVE Sensitive     VANCOMYCIN <=0.5 SENSITIVE Sensitive     TRIMETH/SULFA <=10 SENSITIVE Sensitive     CLINDAMYCIN <=0.25 SENSITIVE Sensitive     RIFAMPIN <=0.5 SENSITIVE Sensitive     Inducible Clindamycin NEGATIVE Sensitive     * FEW STAPHYLOCOCCUS AUREUS   Streptococcus anginosis - MIC*    PENICILLIN <=0.06 SENSITIVE Sensitive     ERYTHROMYCIN <=0.12 SENSITIVE Sensitive     LEVOFLOXACIN 0.5 SENSITIVE Sensitive     VANCOMYCIN 0.5 SENSITIVE Sensitive     * ABUNDANT STREPTOCOCCUS ANGINOSIS     Labs: BNP (last 3 results) No results for input(s): BNP in the last 8760 hours. Basic Metabolic Panel: Recent Labs  Lab 02/07/19 0509 02/08/19 0543 02/10/19 0813 02/11/19 0746  NA 141 138 137 137  K 3.0* 3.3* 3.1* 3.2*  CL 110 109 108 107  CO2 '22 22 23 ' 21*  GLUCOSE 100* 111* 91 106*  BUN '21 23 21 23  ' CREATININE 0.83 0.99 0.76 0.87  CALCIUM 7.8* 7.7* 7.8* 8.0*  MG 1.9 1.8  --  1.9  PHOS 2.3* 2.7  --   --    Liver Function Tests: Recent Labs  Lab 02/07/19 0509 02/08/19 0543  AST 23 21  ALT 5 6  ALKPHOS 111 123  BILITOT 0.9 1.1  PROT 5.6* 5.8*  ALBUMIN 2.0* 2.1*   No results for input(s): LIPASE, AMYLASE in the last 168 hours. No results for input(s): AMMONIA in the last 168 hours. CBC: Recent Labs  Lab 02/07/19 0509 02/08/19 0543 02/10/19 0813  WBC 13.9* 11.2* 9.6  NEUTROABS 12.4* 10.0*  --   HGB 8.9* 8.7* 8.3*  HCT 27.2* 27.0* 26.0*  MCV 96.8 98.2 100.4*  PLT 187 194  176   Cardiac Enzymes: No results for input(s): CKTOTAL, CKMB, CKMBINDEX, TROPONINI in the last 168 hours. BNP: Invalid input(s): POCBNP CBG: Recent Labs  Lab 02/12/19 0551 02/12/19 1150 02/12/19 1723 02/13/19 0030 02/13/19 0543  GLUCAP 88 92 108* 106* 110*   D-Dimer  No results for input(s): DDIMER in the last 72 hours. Hgb A1c No results for input(s): HGBA1C in the last 72 hours. Lipid Profile No results for input(s): CHOL, HDL, LDLCALC, TRIG, CHOLHDL, LDLDIRECT in the last 72 hours. Thyroid function studies No results for input(s): TSH, T4TOTAL, T3FREE, THYROIDAB in the last 72 hours.  Invalid input(s): FREET3 Anemia work up No results for input(s): VITAMINB12, FOLATE, FERRITIN, TIBC, IRON, RETICCTPCT in the last 72 hours. Urinalysis    Component Value Date/Time   COLORURINE YELLOW 01/08/2019 2015   APPEARANCEUR HAZY (A) 01/08/2019 2015   LABSPEC 1.018 01/08/2019 2015   PHURINE 6.0 01/08/2019 2015   GLUCOSEU NEGATIVE 01/08/2019 2015   HGBUR SMALL (A) 01/08/2019 2015   BILIRUBINUR NEGATIVE 01/08/2019 2015   KETONESUR NEGATIVE 01/08/2019 2015   PROTEINUR NEGATIVE 01/08/2019 2015   UROBILINOGEN 0.2 14-Jan-202014 1048   NITRITE NEGATIVE 01/08/2019 2015   LEUKOCYTESUR NEGATIVE 01/08/2019 2015   Sepsis Labs Invalid input(s): PROCALCITONIN,  WBC,  LACTICIDVEN Microbiology Recent Results (from the past 240 hour(s))  Aerobic Culture (superficial specimen)     Status: None   Collection Time: 02/03/19 11:46 AM   Specimen: Sacral; Wound  Result Value Ref Range Status   Specimen Description   Final    SACRAL Performed at [facility], [place] [address], [place], [place] [address]    Special Requests   Final    NONE Performed at [facility], [place] [address], [place], [place] [address]    Gram Stain   Final    RARE WBC PRESENT,BOTH PMN AND MONONUCLEAR ABUNDANT GRAM POSITIVE COCCI IN PAIRS ABUNDANT GRAM VARIABLE ROD Performed at [facility], [place] [address], [place], [place] [address]    Culture   Final    ABUNDANT STREPTOCOCCUS ANGINOSIS FEW STAPHYLOCOCCUS AUREUS    Report Status 02/06/2019 FINAL  Final   Organism ID, Bacteria STREPTOCOCCUS ANGINOSIS  Final   Organism ID, Bacteria STAPHYLOCOCCUS AUREUS  Final      Susceptibility   Staphylococcus aureus - MIC*    CIPROFLOXACIN <=0.5 SENSITIVE Sensitive     ERYTHROMYCIN <=0.25 SENSITIVE Sensitive     GENTAMICIN <=0.5 SENSITIVE Sensitive     OXACILLIN 0.5 SENSITIVE Sensitive     TETRACYCLINE <=1 SENSITIVE Sensitive     VANCOMYCIN <=0.5 SENSITIVE Sensitive     TRIMETH/SULFA <=10 SENSITIVE Sensitive     CLINDAMYCIN <=0.25 SENSITIVE Sensitive     RIFAMPIN <=0.5 SENSITIVE Sensitive     Inducible Clindamycin NEGATIVE Sensitive     * FEW STAPHYLOCOCCUS AUREUS   Streptococcus anginosis - MIC*    PENICILLIN <=0.06 SENSITIVE Sensitive     ERYTHROMYCIN <=0.12 SENSITIVE Sensitive     LEVOFLOXACIN 0.5 SENSITIVE Sensitive     VANCOMYCIN 0.5 SENSITIVE Sensitive     * ABUNDANT STREPTOCOCCUS ANGINOSIS     Time coordinating discharge: 35 minutes  SIGNED:   Louellen Molder, MD  [facility] 02/13/2019, 10:20 AM Pager   If 7PM-7AM, please contact night-coverage [email or web address] Password [email or web address]

## 2019-02-13 NOTE — Progress Notes (Signed)
Physical Therapy Treatment Patient Details Name: Sydney [name] MRN: [id number] DOB: 1930-11-30 Today's Date: 02/13/2019    History of Present Illness [name] is a 85 y.o. 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    PT Comments    Pt scheduled to d/c today. Daughter would like PT to not touch patient. Focused session on caregiver education for daughter. Instructed in positioning to protect skin integrity at bony prominences, importance of avoiding shearing forces, how to A with re-positioning in bed, and elevation of LE for edema management. Daughter states what she really wants to learn is mobility with goal of getting pt back to walking.  Explained that at this time it seems that patient's pain is a limiting factor. PT explained ankle pumps, knee and hip ROM to pt tolerance, although not performed on pt.  Con't to recommend HH.   Follow Up Recommendations  Home health PT;Supervision/Assistance - 24 hour     Equipment Recommendations  None recommended by PT    Recommendations for Other Services       Precautions / Restrictions Precautions Precautions: Fall    Mobility  Bed Mobility                  Transfers                    Ambulation/Gait                 Stairs             Wheelchair Mobility    Modified Rankin (Stroke Patients Only)       Balance                                            Cognition Arousal/Alertness: Awake/alert                                            Exercises      General Comments General comments (skin integrity, edema, etc.): Education for caregiver was the focus of this session. Please see above for details.      Pertinent Vitals/Pain      Home Living                      Prior Function            PT Goals (current goals can now be found in the care plan section) Acute Rehab PT Goals Potential to Achieve Goals: Fair Progress towards PT goals: Not progressing toward goals - comment(education with daughter today)    Frequency    Min 2X/week      PT Plan Current plan remains appropriate    Co-evaluation              AM-PAC PT "6 Clicks" Mobility   Outcome Measure  Help needed turning from your back to your side while in a flat bed without using bedrails?: Total Help needed moving from lying on your back to sitting on the side of a flat bed without using bedrails?: Total Help needed moving to and from a bed to a chair (including a  wheelchair)?: Total Help needed standing up from a chair using your arms (e.g., wheelchair or bedside chair)?: Total Help needed to walk in hospital room?: Total Help needed climbing 3-5 steps with a railing? : Total 6 Click Score: 6    End of Session     Patient left: in bed;with call bell/phone within reach;with family/visitor present   PT Visit Diagnosis: Muscle weakness (generalized) (M62.81);Adult, failure to thrive (R62.7);Other abnormalities of gait and mobility (R26.89)     Time: [id number] PT Time Calculation (min) (ACUTE ONLY): 13 min  Charges:  $Therapeutic Activity: 8-22 mins                     Tyrece Vanterpool L. Tamala Julian, [place] Pager [phone] 02/13/2019    Galen Manila 02/13/2019, 1:25 PM

## 2019-02-13 NOTE — TOC Transition Note (Signed)
Transition of Care [facility]) - CM/SW Discharge Note   Patient Details  Name: Sydney [name] MRN: [id number] Date of Birth: [birth year]  Transition of Care (TOC) CM/SW Contact:  Joaquin Courts, RN Phone Number: 02/13/2019, 1:57 PM   Clinical Narrative:    CM spoke with patient's daughter who states that she will not take her mother home until she has an appointment time scheduled with [facility]. CM explained that CM cannot schedule an appointment time as CM does not represent [facility]. CM reached out to rep for [facility] and informed her of request for appointment time. Rep states she will email scheduling department but may not be able to get an appointment time scheduled today as times are scheduled by the nurse who is currently out performing patient visits and may not see the email until end of day. [facility] rep to call and speak with the daughter about this.    Final next level of care: [place] Barriers to Discharge: No Barriers Identified   Patient Goals and CMS Choice Patient states their goals for this hospitalization and ongoing recovery are:: prefer she goes home CMS Medicare.gov Compare Post Acute Care list provided to:: Patient Represenative (must comment)([name] -daughter) Choice offered to / list presented to : Adult Children  Discharge Placement                       Discharge Plan and Services In-house Referral: Clinical Social Work Discharge Planning Services: CM Consult Post Acute Care Choice: Durable Medical Equipment, Home Health                    HH Arranged: RN, PT, Nurse's Aide [facility] Agency: [place] ([facility]) Date [place]: 02/02/19 Time [place]: [phone] Representative spoke with at [place]: Startup (SDOH) Interventions     Readmission Risk Interventions Readmission Risk Prevention Plan 02/13/2019 01/30/2019  Transportation  Screening Complete Complete  PCP or Specialist Appt within 3-5 Days Complete -  [place] or [place] Complete -  Social Work Consult for [place] Planning/Counseling Complete -  Palliative Care Screening Not Applicable -  Medication Review Press photographer) Complete Complete  PCP or Specialist appointment within 3-5 days of discharge - Complete  [place] or [place] - Complete  SW Recovery Care/Counseling Consult - Complete  [place] - Patient Refused  Some recent data might be hidden

## 2019-02-13 NOTE — Progress Notes (Signed)
Patient had one episode of incontinent urine output which saturated the bed pad.

## 2019-02-13 NOTE — Care Management Important Message (Signed)
Important Message  Patient Details IM Letter given to Nancy Marus RN to present to the Patient Name: Sydney [name] MRN: [id number] Date of Birth: 05-09-1930   Medicare Important Message Given:  Yes     Kerin Salen 02/13/2019, 9:40 AM

## 2019-02-14 MED FILL — GERHARDT'S BUTT CREAM BULK: 20 days supply | Qty: 60 | Fill #0

## 2019-02-15 ENCOUNTER — Inpatient Hospital Stay (HOSPITAL_COMMUNITY)
Admission: EM | Admit: 2019-02-15 | Discharge: 2019-02-26 | DRG: 177 | Disposition: E | Payer: Medicare Other | Attending: Family Medicine | Admitting: Family Medicine

## 2019-02-15 ENCOUNTER — Emergency Department (HOSPITAL_COMMUNITY): Payer: Medicare Other

## 2019-02-15 ENCOUNTER — Other Ambulatory Visit: Payer: Self-pay

## 2019-02-15 ENCOUNTER — Encounter (HOSPITAL_BASED_OUTPATIENT_CLINIC_OR_DEPARTMENT_OTHER): Payer: Self-pay

## 2019-02-15 ENCOUNTER — Encounter (HOSPITAL_COMMUNITY): Payer: Self-pay | Admitting: Emergency Medicine

## 2019-02-15 ENCOUNTER — Encounter (HOSPITAL_BASED_OUTPATIENT_CLINIC_OR_DEPARTMENT_OTHER): Payer: Medicare Other | Admitting: Physician Assistant

## 2019-02-15 DIAGNOSIS — G8191 Hemiplegia, unspecified affecting right dominant side: Secondary | ICD-10-CM | POA: Diagnosis present

## 2019-02-15 DIAGNOSIS — D329 Benign neoplasm of meninges, unspecified: Secondary | ICD-10-CM | POA: Diagnosis present

## 2019-02-15 DIAGNOSIS — I63412 Cerebral infarction due to embolism of left middle cerebral artery: Secondary | ICD-10-CM | POA: Diagnosis present

## 2019-02-15 DIAGNOSIS — G9341 Metabolic encephalopathy: Secondary | ICD-10-CM | POA: Diagnosis present

## 2019-02-15 DIAGNOSIS — Z9889 Other specified postprocedural states: Secondary | ICD-10-CM | POA: Diagnosis not present

## 2019-02-15 DIAGNOSIS — D638 Anemia in other chronic diseases classified elsewhere: Secondary | ICD-10-CM | POA: Diagnosis present

## 2019-02-15 DIAGNOSIS — E8770 Fluid overload, unspecified: Secondary | ICD-10-CM | POA: Diagnosis present

## 2019-02-15 DIAGNOSIS — I110 Hypertensive heart disease with heart failure: Secondary | ICD-10-CM | POA: Diagnosis present

## 2019-02-15 DIAGNOSIS — I471 Supraventricular tachycardia, unspecified: Secondary | ICD-10-CM | POA: Diagnosis present

## 2019-02-15 DIAGNOSIS — L98429 Non-pressure chronic ulcer of back with unspecified severity: Secondary | ICD-10-CM | POA: Diagnosis not present

## 2019-02-15 DIAGNOSIS — J189 Pneumonia, unspecified organism: Secondary | ICD-10-CM | POA: Diagnosis present

## 2019-02-15 DIAGNOSIS — Z8619 Personal history of other infectious and parasitic diseases: Secondary | ICD-10-CM | POA: Diagnosis not present

## 2019-02-15 DIAGNOSIS — I5043 Acute on chronic combined systolic (congestive) and diastolic (congestive) heart failure: Secondary | ICD-10-CM | POA: Diagnosis present

## 2019-02-15 DIAGNOSIS — R0902 Hypoxemia: Secondary | ICD-10-CM | POA: Insufficient documentation

## 2019-02-15 DIAGNOSIS — N179 Acute kidney failure, unspecified: Secondary | ICD-10-CM | POA: Diagnosis present

## 2019-02-15 DIAGNOSIS — I63512 Cerebral infarction due to unspecified occlusion or stenosis of left middle cerebral artery: Secondary | ICD-10-CM | POA: Diagnosis not present

## 2019-02-15 DIAGNOSIS — J9809 Other diseases of bronchus, not elsewhere classified: Secondary | ICD-10-CM | POA: Diagnosis present

## 2019-02-15 DIAGNOSIS — E872 Acidosis, unspecified: Secondary | ICD-10-CM | POA: Diagnosis present

## 2019-02-15 DIAGNOSIS — E43 Unspecified severe protein-calorie malnutrition: Secondary | ICD-10-CM | POA: Diagnosis present

## 2019-02-15 DIAGNOSIS — B965 Pseudomonas (aeruginosa) (mallei) (pseudomallei) as the cause of diseases classified elsewhere: Secondary | ICD-10-CM | POA: Diagnosis present

## 2019-02-15 DIAGNOSIS — J690 Pneumonitis due to inhalation of food and vomit: Secondary | ICD-10-CM | POA: Diagnosis present

## 2019-02-15 DIAGNOSIS — L89154 Pressure ulcer of sacral region, stage 4: Secondary | ICD-10-CM | POA: Diagnosis present

## 2019-02-15 DIAGNOSIS — Z20828 Contact with and (suspected) exposure to other viral communicable diseases: Secondary | ICD-10-CM | POA: Diagnosis present

## 2019-02-15 DIAGNOSIS — I639 Cerebral infarction, unspecified: Secondary | ICD-10-CM

## 2019-02-15 DIAGNOSIS — Z681 Body mass index (BMI) 19 or less, adult: Secondary | ICD-10-CM | POA: Diagnosis not present

## 2019-02-15 DIAGNOSIS — I340 Nonrheumatic mitral (valve) insufficiency: Secondary | ICD-10-CM | POA: Diagnosis not present

## 2019-02-15 DIAGNOSIS — R627 Adult failure to thrive: Secondary | ICD-10-CM | POA: Diagnosis present

## 2019-02-15 DIAGNOSIS — J90 Pleural effusion, not elsewhere classified: Principal | ICD-10-CM | POA: Diagnosis present

## 2019-02-15 DIAGNOSIS — Z79899 Other long term (current) drug therapy: Secondary | ICD-10-CM

## 2019-02-15 DIAGNOSIS — T17990A Other foreign object in respiratory tract, part unspecified in causing asphyxiation, initial encounter: Secondary | ICD-10-CM | POA: Diagnosis not present

## 2019-02-15 DIAGNOSIS — M810 Age-related osteoporosis without current pathological fracture: Secondary | ICD-10-CM | POA: Diagnosis present

## 2019-02-15 DIAGNOSIS — Y95 Nosocomial condition: Secondary | ICD-10-CM | POA: Diagnosis present

## 2019-02-15 DIAGNOSIS — R64 Cachexia: Secondary | ICD-10-CM | POA: Diagnosis present

## 2019-02-15 DIAGNOSIS — I469 Cardiac arrest, cause unspecified: Secondary | ICD-10-CM | POA: Diagnosis not present

## 2019-02-15 DIAGNOSIS — Z66 Do not resuscitate: Secondary | ICD-10-CM | POA: Diagnosis not present

## 2019-02-15 DIAGNOSIS — Z7982 Long term (current) use of aspirin: Secondary | ICD-10-CM

## 2019-02-15 DIAGNOSIS — I351 Nonrheumatic aortic (valve) insufficiency: Secondary | ICD-10-CM | POA: Diagnosis not present

## 2019-02-15 DIAGNOSIS — N189 Chronic kidney disease, unspecified: Secondary | ICD-10-CM | POA: Diagnosis present

## 2019-02-15 DIAGNOSIS — G9340 Encephalopathy, unspecified: Secondary | ICD-10-CM | POA: Diagnosis not present

## 2019-02-15 DIAGNOSIS — I429 Cardiomyopathy, unspecified: Secondary | ICD-10-CM | POA: Diagnosis present

## 2019-02-15 DIAGNOSIS — R339 Retention of urine, unspecified: Secondary | ICD-10-CM | POA: Diagnosis not present

## 2019-02-15 DIAGNOSIS — E870 Hyperosmolality and hypernatremia: Secondary | ICD-10-CM | POA: Diagnosis present

## 2019-02-15 DIAGNOSIS — R5381 Other malaise: Secondary | ICD-10-CM | POA: Diagnosis present

## 2019-02-15 DIAGNOSIS — Z4659 Encounter for fitting and adjustment of other gastrointestinal appliance and device: Secondary | ICD-10-CM

## 2019-02-15 DIAGNOSIS — E876 Hypokalemia: Secondary | ICD-10-CM | POA: Diagnosis present

## 2019-02-15 DIAGNOSIS — R188 Other ascites: Secondary | ICD-10-CM | POA: Diagnosis present

## 2019-02-15 DIAGNOSIS — R1310 Dysphagia, unspecified: Secondary | ICD-10-CM | POA: Diagnosis present

## 2019-02-15 DIAGNOSIS — E162 Hypoglycemia, unspecified: Secondary | ICD-10-CM | POA: Diagnosis present

## 2019-02-15 DIAGNOSIS — Z0189 Encounter for other specified special examinations: Secondary | ICD-10-CM

## 2019-02-15 DIAGNOSIS — E860 Dehydration: Secondary | ICD-10-CM | POA: Diagnosis present

## 2019-02-15 DIAGNOSIS — J9601 Acute respiratory failure with hypoxia: Secondary | ICD-10-CM | POA: Diagnosis present

## 2019-02-15 DIAGNOSIS — R4789 Other speech disturbances: Secondary | ICD-10-CM | POA: Diagnosis not present

## 2019-02-15 DIAGNOSIS — L89329 Pressure ulcer of left buttock, unspecified stage: Secondary | ICD-10-CM | POA: Diagnosis present

## 2019-02-15 DIAGNOSIS — Z978 Presence of other specified devices: Secondary | ICD-10-CM

## 2019-02-15 DIAGNOSIS — E785 Hyperlipidemia, unspecified: Secondary | ICD-10-CM | POA: Diagnosis present

## 2019-02-15 DIAGNOSIS — J9811 Atelectasis: Secondary | ICD-10-CM | POA: Diagnosis present

## 2019-02-15 DIAGNOSIS — Z8673 Personal history of transient ischemic attack (TIA), and cerebral infarction without residual deficits: Secondary | ICD-10-CM

## 2019-02-15 DIAGNOSIS — R918 Other nonspecific abnormal finding of lung field: Secondary | ICD-10-CM | POA: Diagnosis not present

## 2019-02-15 DIAGNOSIS — Z515 Encounter for palliative care: Secondary | ICD-10-CM | POA: Diagnosis present

## 2019-02-15 DIAGNOSIS — I4891 Unspecified atrial fibrillation: Secondary | ICD-10-CM | POA: Diagnosis not present

## 2019-02-15 DIAGNOSIS — Z7902 Long term (current) use of antithrombotics/antiplatelets: Secondary | ICD-10-CM

## 2019-02-15 DIAGNOSIS — R0602 Shortness of breath: Secondary | ICD-10-CM

## 2019-02-15 DIAGNOSIS — D649 Anemia, unspecified: Secondary | ICD-10-CM | POA: Diagnosis present

## 2019-02-15 DIAGNOSIS — Z539 Procedure and treatment not carried out, unspecified reason: Secondary | ICD-10-CM | POA: Diagnosis not present

## 2019-02-15 DIAGNOSIS — I130 Hypertensive heart and chronic kidney disease with heart failure and stage 1 through stage 4 chronic kidney disease, or unspecified chronic kidney disease: Secondary | ICD-10-CM | POA: Diagnosis present

## 2019-02-15 DIAGNOSIS — Z7189 Other specified counseling: Secondary | ICD-10-CM | POA: Diagnosis not present

## 2019-02-15 DIAGNOSIS — Z86011 Personal history of benign neoplasm of the brain: Secondary | ICD-10-CM

## 2019-02-15 DIAGNOSIS — Z8249 Family history of ischemic heart disease and other diseases of the circulatory system: Secondary | ICD-10-CM

## 2019-02-15 DIAGNOSIS — H409 Unspecified glaucoma: Secondary | ICD-10-CM | POA: Diagnosis present

## 2019-02-15 DIAGNOSIS — G309 Alzheimer's disease, unspecified: Secondary | ICD-10-CM | POA: Diagnosis present

## 2019-02-15 DIAGNOSIS — N390 Urinary tract infection, site not specified: Secondary | ICD-10-CM | POA: Diagnosis present

## 2019-02-15 DIAGNOSIS — I739 Peripheral vascular disease, unspecified: Secondary | ICD-10-CM | POA: Diagnosis present

## 2019-02-15 DIAGNOSIS — Z8719 Personal history of other diseases of the digestive system: Secondary | ICD-10-CM | POA: Diagnosis not present

## 2019-02-15 DIAGNOSIS — E7800 Pure hypercholesterolemia, unspecified: Secondary | ICD-10-CM | POA: Diagnosis present

## 2019-02-15 DIAGNOSIS — R4182 Altered mental status, unspecified: Secondary | ICD-10-CM | POA: Diagnosis not present

## 2019-02-15 DIAGNOSIS — R569 Unspecified convulsions: Secondary | ICD-10-CM | POA: Diagnosis present

## 2019-02-15 DIAGNOSIS — Z823 Family history of stroke: Secondary | ICD-10-CM

## 2019-02-15 DIAGNOSIS — F0280 Dementia in other diseases classified elsewhere without behavioral disturbance: Secondary | ICD-10-CM | POA: Diagnosis present

## 2019-02-15 DIAGNOSIS — R4701 Aphasia: Secondary | ICD-10-CM | POA: Diagnosis present

## 2019-02-15 DIAGNOSIS — T17500A Unspecified foreign body in bronchus causing asphyxiation, initial encounter: Secondary | ICD-10-CM | POA: Diagnosis present

## 2019-02-15 DIAGNOSIS — Z7401 Bed confinement status: Secondary | ICD-10-CM

## 2019-02-15 LAB — BASIC METABOLIC PANEL
Anion gap: 13 (ref 5–15)
BUN: 31 mg/dL — ABNORMAL HIGH (ref 8–23)
CO2: 18 mmol/L — ABNORMAL LOW (ref 22–32)
Calcium: 8.1 mg/dL — ABNORMAL LOW (ref 8.9–10.3)
Chloride: 103 mmol/L (ref 98–111)
Creatinine, Ser: 1.22 mg/dL — ABNORMAL HIGH (ref 0.44–1.00)
GFR calc Af Amer: 46 mL/min — ABNORMAL LOW (ref >60)
GFR calc non Af Amer: 40 mL/min — ABNORMAL LOW (ref >60)
Glucose, Bld: 72 mg/dL (ref 70–99)
Potassium: 3.7 mmol/L (ref 3.5–5.1)
Sodium: 134 mmol/L — ABNORMAL LOW (ref 135–145)

## 2019-02-15 LAB — BLOOD GAS, ARTERIAL
Acid-base deficit: 4.1 mmol/L — ABNORMAL HIGH (ref 0.0–2.0)
Bicarbonate: 19 mmol/L — ABNORMAL LOW (ref 20.0–28.0)
FIO2: 21
O2 Saturation: 92.2 %
Patient temperature: 98.6
pCO2 arterial: 29.6 mmHg — ABNORMAL LOW (ref 32.0–48.0)
pH, Arterial: 7.422 (ref 7.350–7.450)
pO2, Arterial: 66.5 mmHg — ABNORMAL LOW (ref 83.0–108.0)

## 2019-02-15 LAB — CBC
HCT: 33.3 % — ABNORMAL LOW (ref 36.0–46.0)
Hemoglobin: 10.5 g/dL — ABNORMAL LOW (ref 12.0–15.0)
MCH: 32.4 pg (ref 26.0–34.0)
MCHC: 31.5 g/dL (ref 30.0–36.0)
MCV: 102.8 fL — ABNORMAL HIGH (ref 80.0–100.0)
Platelets: 197 10*3/uL (ref 150–400)
RBC: 3.24 MIL/uL — ABNORMAL LOW (ref 3.87–5.11)
RDW: 21.2 % — ABNORMAL HIGH (ref 11.5–15.5)
WBC: 14.6 10*3/uL — ABNORMAL HIGH (ref 4.0–10.5)
nRBC: 0.2 % (ref 0.0–0.2)

## 2019-02-15 LAB — SARS CORONAVIRUS 2 (TAT 6-24 HRS): SARS Coronavirus 2: NEGATIVE

## 2019-02-15 MED ORDER — LATANOPROST 0.005 % OP SOLN
1.0000 [drp] | Freq: Every day | OPHTHALMIC | Status: DC
  Administered 2019-02-16 – 2019-02-22 (×7): 1 [drp] via OPHTHALMIC
  Filled 2019-02-15 (×2): qty 2.5

## 2019-02-15 MED ORDER — CLOPIDOGREL BISULFATE 75 MG PO TABS
75.0000 mg | ORAL_TABLET | Freq: Every day | ORAL | Status: DC
  Filled 2019-02-15 (×3): qty 1

## 2019-02-15 MED ORDER — ACETAMINOPHEN 500 MG PO TABS
1000.0000 mg | ORAL_TABLET | Freq: Three times a day (TID) | ORAL | Status: DC
  Filled 2019-02-15 (×3): qty 2

## 2019-02-15 MED ORDER — ASPIRIN EC 81 MG PO TBEC
81.0000 mg | DELAYED_RELEASE_TABLET | Freq: Every day | ORAL | Status: DC
  Filled 2019-02-15 (×3): qty 1

## 2019-02-15 MED ORDER — METRONIDAZOLE IN NACL 5-0.79 MG/ML-% IV SOLN
500.0000 mg | Freq: Three times a day (TID) | INTRAVENOUS | Status: DC
  Administered 2019-02-16 – 2019-02-20 (×14): 500 mg via INTRAVENOUS
  Filled 2019-02-15 (×15): qty 100

## 2019-02-15 MED ORDER — VANCOMYCIN 50 MG/ML ORAL SOLUTION: 125.0000 mg | Freq: Every day | ORAL | Status: DC

## 2019-02-15 MED ORDER — ENOXAPARIN SODIUM 30 MG/0.3ML ~~LOC~~ SOLN
30.0000 mg | SUBCUTANEOUS | Status: DC
  Administered 2019-02-18 – 2019-02-22 (×5): 30 mg via SUBCUTANEOUS
  Filled 2019-02-15 (×8): qty 0.3

## 2019-02-15 MED ORDER — LIDOCAINE HCL 1 % IJ SOLN
INTRAMUSCULAR | Status: AC
  Administered 2019-02-15: 13:00:00
  Filled 2019-02-15: qty 20

## 2019-02-15 MED ORDER — TRAMADOL HCL 50 MG PO TABS: 25.0000 mg | ORAL_TABLET | Freq: Two times a day (BID) | ORAL | Status: DC | PRN

## 2019-02-15 MED ORDER — MEGESTROL ACETATE 400 MG/10ML PO SUSP: 400.0000 mg | Freq: Three times a day (TID) | ORAL | Status: DC

## 2019-02-15 MED ORDER — MEGESTROL ACETATE 400 MG/10ML PO SUSP
400.0000 mg | Freq: Two times a day (BID) | ORAL | Status: DC
  Filled 2019-02-15 (×7): qty 10

## 2019-02-15 MED ORDER — MIRTAZAPINE 30 MG PO TBDP
45.0000 mg | ORAL_TABLET | Freq: Every day | ORAL | Status: DC
  Filled 2019-02-15: qty 1

## 2019-02-15 MED ORDER — MIRTAZAPINE 30 MG PO TBDP
45.0000 mg | ORAL_TABLET | Freq: Every day | ORAL | Status: DC
  Administered 2019-02-16: 45 mg via ORAL
  Filled 2019-02-15 (×4): qty 1

## 2019-02-15 NOTE — ED Triage Notes (Signed)
Patient arrived by EMS from home. Pt had low O2 saturation at wound care center.   Patient was placed on 6L [place] by [facility] staff.   PTAR staff stated SpO2 was 85% on RA.   Pt was recently discharged from hospital. Pt was admitted for sacral wound.

## 2019-02-15 NOTE — ED Notes (Signed)
Dr. Tomi Bamberger at bedside with RT to obtain ABG.

## 2019-02-15 NOTE — ED Notes (Signed)
Was unable to get pt's oxygen. Would not pick up on her forehead, ear, toe, or fingers. Transport had a difficult time getting oxygen to read as well as wound care center (per [facility])    Pure wick has been placed. Suction set to 57mmHg. Pt and family member has been informed. Pt was also cleaned upon arrival.

## 2019-02-15 NOTE — ED Provider Notes (Signed)
[facility] DEPT Provider Note   CSN: [id number] Arrival date & time: 02/06/2019  1019     History   Chief Complaint Chief Complaint  Patient presents with  . Low oxygen saturation    HPI Sydney [name] is a 85 y.o. female.     HPI Patient presented to the emergency room for evaluation of possible low oxygen saturation.  Patient was admitted to the hospital on October 8 for treatment of a decubitus ulcer.  Patient was discharged 2 days ago.  According to family members, Patient did have some issues with her oxygen level while she was in the hospital.  She was not discharged however on any supplemental oxygen.  According to discharge summary the patient did have pleural effusions felt to be related to vascular congestion.  Patient had an appointment at the wound care center today.  They had difficulty obtaining her oxygen saturation.  They recommended she come to the emergency room for evaluation.  Daughter states the patient has not been coughing.  Patient is not complaining of shortness of breath. Past Medical History:  Diagnosis Date  . Anemia    years ago  . Aortic atherosclerosis ([place])   . Arthritis    "left hand" (02/17/2013)  . Bilateral inguinal hernia    INCIDENTAL CT 1/20  . C. difficile diarrhea   . Cataract   . Dehydration 12/29/2018  . Diplopia    CRANIAL 6 NERVE PALSY  . Dizzy   . Dyslipidemia   . Gastritis   . Glaucoma   . High cholesterol    "on RX years ago" (02/17/2013)  . Hypertension   . Leukopenia   . Meningioma ([place]) 05/01/2015  . Osteoporosis 02/2014   T score -2.5  followed by Dr. Lysle Rubens  . Palpitations    PACs, PVCs and short runs of atrial tachycardia on Holter monitoring  . PVD (peripheral vascular disease) ([place])   . Rhinitis   . Syncopal episodes 2003  . TIA (transient ischemic attack) 1990's  . Trigger thumb of left hand   . Weakness 12/29/2018  . Winter itch     Patient Active Problem List   Diagnosis  Date Noted  . Pressure ulcer with full thickness skin loss involving damage or necrosis of subcutaneous tissue ([place]) 02/13/2019  . TIA (transient ischemic attack)   . PVD (peripheral vascular disease) ([place])   . SOB (shortness of breath)   . Wheezing   . Sacral ulcer ([place]) 02/02/2019  . Abdominal pain   . B12 deficiency   . Loose stools   . MDD (major depressive disorder) 01/12/2019  . Failure to thrive in adult 01/02/2019  . Evaluation by psychiatric service required   . Malnutrition of moderate degree 12/20/2018  . Elevated LFTs   . Near syncope   . General weakness 12/17/2018  . Generalized weakness 12/16/2018  . Pressure injury of skin 12/16/2018  . Transaminitis 12/16/2018  . Ataxia 10/02/2017  . History of TIA (transient ischemic attack) 10/02/2017  . HTN (hypertension) 10/02/2017  . HLD (hyperlipidemia) 10/02/2017  . Palpable mass of neck-?? lymph node 10/02/2017  . Protein-calorie malnutrition, severe 10/20/2016  . Hypertension 10/17/2016  . High cholesterol 10/17/2016  . History of Clostridioides difficile colitis 10/17/2016  . Dehydration with hyponatremia 10/17/2016  . Acute hypokalemia 10/17/2016  . Protein calorie malnutrition ([place]) 10/17/2016  . C. difficile colitis 10/05/2016  . Physical deconditioning 10/05/2016  . Recurrent Clostridium difficile diarrhea 10/05/2016  . Balance problem 10/05/2016  .  History of transient ischemic attack (TIA) 10/05/2016  . Dehydration   . Hypokalemia 05/14/2016  . Anemia 05/14/2016  . Hyponatremia 05/14/2016  . Glaucoma 05/14/2016  . Meningioma ([place]) 05/01/2015  . 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 Diplopia 04/08/2015  . Pancytopenia ([place]) 04/08/2015  . Bradycardia 02/17/2013  . Occlusion and stenosis of carotid artery without mention of cerebral infarction 02/17/2013    Past Surgical History:  Procedure Laterality Date  . ANAL FISTULECTOMY  1970  . COLONOSCOPY    . COLONOSCOPY WITH PROPOFOL N/A 12/02/2016    Procedure: COLONOSCOPY WITH PROPOFOL;  Surgeon: Gatha Mayer, MD;  Location: [facility] ENDOSCOPY;  Service: Endoscopy;  Laterality: N/A;  . 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 TONSILLECTOMY       OB History    Gravida  1   Para  1   Term  1   Preterm      AB      Living  1     SAB      TAB      Ectopic      Multiple      Live Births               Home Medications    Prior to Admission medications   Medication Sig Start Date End Date Taking? Authorizing Provider  acetaminophen (TYLENOL) 650 MG suppository Place 1 suppository (650 mg total) rectally every 6 (six) hours as needed for mild pain (or Fever >/= 101). 02/13/19   Dhungel, Flonnie Overman, MD  Amino Acids-Protein Hydrolys (FEEDING SUPPLEMENT, PRO-STAT SUGAR FREE 64,) LIQD Take 30 mLs by mouth 2 (two) times daily with a meal. Patient not taking: Reported on 02/02/2019 01/31/19 03/02/19  Jonetta Osgood, MD  aspirin (ECOTRIN LOW STRENGTH) 81 MG EC tablet Take 81 mg by mouth daily. Swallow whole.    [provider]  cephALEXin (KEFLEX) 500 MG capsule Take 1 capsule (500 mg total) by mouth every 12 (twelve) hours for 3 days. 02/13/19 02/16/19  Dhungel, Flonnie Overman, MD  cholecalciferol (VITAMIN D) 1000 units tablet Take 1,000 Units by mouth daily.    [provider]  clopidogrel (PLAVIX) 75 MG tablet Take 75 mg by mouth daily.    [provider]  feeding supplement, ENSURE ENLIVE, (ENSURE ENLIVE) LIQD Take 237 mLs by mouth 2 (two) times daily between meals. 02/13/19   Dhungel, Flonnie Overman, MD  folic acid (FOLVITE) 1 MG tablet Take 1 mg by mouth daily.    [provider]  Hydrocortisone (GERHARDT'S BUTT CREAM) CREA Apply 1 application topically 3 (three) times daily. Apply barrier cream to buttocks/sacrum TID and PRN with each incontinent cleaning session 01/31/19 03/02/19  Jonetta Osgood, MD  latanoprost (XALATAN) 0.005 % ophthalmic solution Place 1 drop into both eyes at bedtime.     [provider]  loperamide (IMODIUM) 2 MG capsule Take 1 capsule (2 mg total) by mouth as needed for diarrhea or loose stools. 01/31/19   Ghimire, Henreitta Leber, MD  megestrol (MEGACE) 400 MG/10ML suspension Take 10 mLs (400 mg total) by mouth 3 (three) times daily. 01/31/19 03/02/19  Ghimire, Henreitta Leber, MD  mirtazapine (REMERON SOL-TAB) 45 MG disintegrating tablet Take 1 tablet (45 mg total) by mouth at bedtime. 01/31/19   Ghimire, Henreitta Leber, MD  Multiple Vitamin (MULTIVITAMIN WITH MINERALS) TABS tablet Take 1 tablet by mouth daily.  02/01/19   Ghimire, Henreitta Leber, MD  nutrition supplement, JUVEN, (JUVEN) PACK Take 1 packet by mouth 2 (two) times daily between meals. 02/13/19   Dhungel, Flonnie Overman, MD  nystatin (MYCOSTATIN/NYSTOP) powder Apply topically 2 (two) times daily. Apply to affected area on the skin folds 01/31/19   Ghimire, Henreitta Leber, MD  pantoprazole (PROTONIX) 20 MG tablet Take 20 mg by mouth daily. 12/09/18   [provider]  traMADol (ULTRAM) 50 MG tablet Take 0.5 tablets (25 mg total) by mouth every 12 (twelve) hours as needed for moderate pain. 01/31/19   Ghimire, Henreitta Leber, MD    Family History Family History  Problem Relation Age of Onset  . Hypertension Father   . Heart attack Father   . Stroke Brother   . Stroke Maternal Grandmother     Social History Social History   Tobacco Use  . Smoking status: Never Smoker  . Smokeless tobacco: Never Used  Substance Use Topics  . Alcohol use: No  . Drug use: No     Allergies   Demerol [meperidine], Other, Codeine, Sulfa antibiotics, and Tomato   Review of Systems Review of Systems  All other systems reviewed and are negative.    Physical Exam Updated Vital Signs BP 133/75   Pulse 84   Temp 98.8 F (37.1 C) (Rectal)   Resp 17   Ht 1.6 m (5\' 3" )   Wt 46.9 kg   BMI 18.32 kg/m   Physical Exam  Vitals signs and nursing note reviewed.  Constitutional:      General: She is not in acute distress.    Appearance: She is well-developed. She is not toxic-appearing or diaphoretic.     Comments: Elderly, frail  HENT:     Head: Normocephalic and atraumatic.     Right Ear: External ear normal.     Left Ear: External ear normal.  Eyes:     General: No scleral icterus.       Right eye: No discharge.        Left eye: No discharge.     Conjunctiva/sclera: Conjunctivae normal.  Neck:     Musculoskeletal: Neck supple.     Trachea: No tracheal deviation.  Cardiovascular:     Rate and Rhythm: Normal rate and regular rhythm.  Pulmonary:     Effort: Pulmonary effort is normal. No respiratory distress.     Breath sounds: Normal breath sounds. No stridor. No wheezing or rales.  Abdominal:     General: Bowel sounds are normal. There is no distension.     Palpations: Abdomen is soft.     Tenderness: There is no abdominal tenderness. There is no guarding or rebound.  Musculoskeletal:        General: No tenderness.  Skin:    General: Skin is warm and dry.     Findings: No rash.  Neurological:     Mental Status: She is alert.     Cranial Nerves: No cranial nerve deficit (no facial droop ).     Sensory: No sensory deficit.     Motor: No abnormal muscle tone or seizure activity.     Comments: Patient is awake but the daughter is answering all questions,      ED Treatments / Results  Labs (all labs ordered are listed, but only abnormal results are displayed) Labs Reviewed  CBC - Abnormal; Notable for the following components:      Result Value   WBC 14.6 (*)    RBC 3.24 (*)  Hemoglobin 10.5 (*)    HCT 33.3 (*)    MCV 102.8 (*)    RDW 21.2 (*)    All other components within normal limits  BASIC METABOLIC PANEL - Abnormal; Notable for the following components:   Sodium 134 (*)    CO2 18 (*)    BUN 31 (*)    Creatinine, Ser 1.22 (*)    Calcium 8.1 (*)    GFR calc non Af Amer 40  (*)    GFR calc Af Amer 46 (*)    All other components within normal limits  BLOOD GAS, ARTERIAL - Abnormal; Notable for the following components:   pCO2 arterial 29.6 (*)    pO2, Arterial 66.5 (*)    Bicarbonate 19.0 (*)    Acid-base deficit 4.1 (*)    All other components within normal limits    EKG None  Radiology Dg Chest Portable 1 View  Result Date: 02/24/2019 CLINICAL DATA:  Shortness of breath, weakness and low oxygen saturation. EXAM: PORTABLE CHEST 1 VIEW COMPARISON:  Chest x-ray 02/07/2019 FINDINGS: The heart is mildly enlarged but stable. Stable tortuosity and calcification of the thoracic aorta. Persistent vague right upper lobe opacity but the rest of the right lung demonstrates improved aeration when compared to prior studies. There is diffuse hazy opacity of the left lung which could be due to progressive left lower lobe atelectasis and left pleural effusion. The bony structures are unremarkable. IMPRESSION: 1. Persistent vague right upper lobe lung opacity, likely persistent infiltrate. 2. Loss of volume in the left hemithorax possibly due to progressive left lower lobe atelectasis and/or pleural effusion. CT may be helpful for further evaluation. Electronically Signed   By: Marijo Sanes M.D.   On: 02/14/2019 12:30    Procedures ARTERIAL BLOOD GAS  Date/Time: 02/12/2019 12:17 PM Performed by: Dorie Rank, MD Authorized by: Dorie Rank, MD   Consent:    Consent obtained:  Verbal   Consent given by:  Patient and healthcare agent   Risks discussed:  Bleeding, pain, nerve damage and infection   Alternatives discussed:  No treatment Anesthesia (see MAR for exact dosages):    Anesthesia method:  Local infiltration   Local anesthetic:  Lidocaine 1% w/o epi Procedure details:    Location:  R femoral   Number of attempts:  2 Post-procedure details:    Dressing applied: yes     Bleeding:  Manual pressure applied   Circulation, movement, and sensation:  Normal    Patient tolerance of procedure:  Tolerated well, no immediate complications  .Critical Care Performed by: Dorie Rank, MD Authorized by: Dorie Rank, MD   Critical care provider statement:    Critical care time (minutes):  30   Critical care was time spent personally by me on the following activities:  Discussions with consultants, evaluation of patient's response to treatment, examination of patient, ordering and performing treatments and interventions, ordering and review of laboratory studies, ordering and review of radiographic studies, pulse oximetry, re-evaluation of patient's condition, obtaining history from patient or surrogate and review of old charts   (including critical care time)  Medications Ordered in ED Medications  lidocaine (XYLOCAINE) 1 % (with pres) injection (  Given by Other 02/16/2019 1236)     Initial Impression / Assessment and Plan / ED Course  I have reviewed the triage vital signs and the nursing notes.  Pertinent labs & imaging results that were available during my care of the patient were reviewed by me and considered  in my medical decision making (see chart for details).  Clinical Course as of Feb 14 1305  Wed Feb 15, 2019  1107 Unable to pick up the O2 sat.  Will check an abg   [JK]  1154 I was notified that respiratory was unable to get an ABG.   [id number] Patient's ABG does show hypoxia with her PO2 decreased at 66.  We are still unable to obtain a pulse ox.   [JK]  1251 Blood cell count does show an increasing white blood cell count.  Her anemia is stable.   [id number] Bicarb is decreased with increasing BUN and creatinine.   [JK]  [id number] Chest x-ray shows loss of volume and may left hemithorax and persistent infiltrate.   [id number] Discussed findings with daughter.  They agree for admission.   [JK]    Clinical Course User Index [JK] Dorie Rank, MD     Patient was sent to the ED for evaluation of hypoxia.  Patient was just discharged from  the hospital 2 days ago.  At the wound care center she was noted to be hypoxic.We were unable to pick up an oxygen saturation in the ED. patient's PaO2 was 66.  Patient has decreasing bicarb and an elevation white blood cell count.  Chest x-ray shows worsening findings. Effusion, ?mass , infection? Plan on CT scan for further evaluation.  Patient will need supplemental oxygen.  I will consult the medical service for admission.  Final Clinical Impressions(s) / ED Diagnoses   Final diagnoses:  Pleural effusion  Hypoxia      Dorie Rank, MD 02/08/2019 1306

## 2019-02-15 NOTE — Progress Notes (Signed)
Abg attempted brachially x 2 by 2 RTs for a total of 4 times. MD notified that pt would need a femoral stick for abg. ABG kits and requisition left with rn at bedside.

## 2019-02-15 NOTE — ED Notes (Signed)
Lab called and informed that the ABG was on it's way to them.  Freda Munro from lab expressed acknowledgement.

## 2019-02-15 NOTE — H&P (Signed)
History and Physical    Sydney [name] [id number] DOB: [birth year] DOA: 02/04/2019  PCP: [name], MD Patient coming from: Home patient lives at home with her daughter.  Chief Complaint: Low oxygen saturation  HPI: GESSICA [name] is a 85 y.o. 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  Patient recently admitted to the hospital for decubitus ulcer discharged on Monday, 02/13/2019 to follow-up at the wound clinic.  Patient's daughter took her to the wound clinic today and there she was found to be hypoxic and was sent to the ER.  In the ER ABG was done with a PO2 of 60.  Patient does not have oxygen at home.  They were unable to get a pulse ox in the ER on arrival.  History was obtained from the daughter.  She denied any fever chills cough nausea vomiting  and has had no sick contacts.  She has chronic diarrhea. Daughter reports patient walked prior to admission to the hospital and wants everything done so that she can get back to her baseline and start walking again. She does not want her mother to get any sedative drugs or Ativan or narcotics.  Patient had multiple hospital admissions.  She was seen by physical therapy recommended SNF but daughter took her home instead. CT pelvis last admission shows soft tissue ulcer left coccyx with no evidence of osteomyelitis.  Patient has had prolonged hospitalization from 8/24 to 01/31/2019 for failure to thrive generalized weakness and weight loss.  She was seen by multiple subspecialist.  Daughter refused palliative care consult.  Ethics committee was involved in the past hospital stay.  Consulted last admission for pleural effusion recommended this was probably due to vascular congestion.  Seen by ID for infectious decubitus ulcer.  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  Recommended possible biopsy but daughter refused.  ED Course: CT of the chest shows large bilateral pleural effusion left greater than right.  Complete atelectasis of the left lower lobe noted with large amount of abnormal soft tissue density seen in the left mainstem bronchus and lower lobe bronchi concerning for aspirated material or mucous plugging.  Bronchoscopy is recommended for further evaluation.  Mild interstitial densities noted in the right upper lobe concerning for focal inflammation or atelectasis. Vital signs 133/75, 84, afebrile 98.8. Review of Systems: As per HPI otherwise all other systems reviewed and are negative  Ambulatory Status: Daughter reports her mother used to walk prior to admission to hospital currently she is mostly confined to bed or wheelchair.    Past Medical History:  Diagnosis Date  . Anemia    years ago  . Aortic atherosclerosis ([place])   . Arthritis    "left hand" (02/17/2013)  . Bilateral inguinal hernia    INCIDENTAL CT 1/20  . C. difficile diarrhea   . Cataract   . Dehydration 12/29/2018  . Diplopia    CRANIAL 6 NERVE PALSY  . Dizzy   . Dyslipidemia   . Gastritis   . Glaucoma   . High cholesterol    "on RX years ago" (02/17/2013)  . Hypertension   . Leukopenia   . Meningioma ([place]) 05/01/2015  . Osteoporosis 02/2014   T score -2.5  followed by Dr. Lysle Rubens  . Palpitations    PACs, PVCs and short runs of atrial tachycardia on Holter  monitoring  . PVD (peripheral vascular disease) ([place])   . Rhinitis   . Syncopal episodes 2003  . TIA (transient ischemic attack) 1990's  . Trigger thumb of left hand   . Weakness 12/29/2018  . Winter itch     Past Surgical History:  Procedure Laterality Date  . ANAL FISTULECTOMY  1970  . COLONOSCOPY    . COLONOSCOPY WITH PROPOFOL N/A 12/02/2016   Procedure: COLONOSCOPY WITH PROPOFOL;  Surgeon: Gatha Mayer, MD;  Location: [facility] ENDOSCOPY;  Service: Endoscopy;  Laterality: N/A;   . FECAL TRANSPLANT N/A 12/02/2016   Procedure: FECAL TRANSPLANT;  Surgeon: Gatha Mayer, MD;  Location: [facility] ENDOSCOPY;  Service: Endoscopy;  Laterality: N/A;  . TONSILLECTOMY      Social History   Socioeconomic History  . Marital status: Widowed    Spouse name: Not on file  . Number of children: 1  . Years of education: 61  . Highest education level: Not on file  Occupational History  . Occupation: retired  Scientific laboratory technician  . Financial resource strain: Not on file  . Food insecurity    Worry: Not on file    Inability: Not on file  . Transportation needs    Medical: Not on file    Non-medical: Not on file  Tobacco Use  . Smoking status: Never Smoker  . Smokeless tobacco: Never Used  Substance and Sexual Activity  . Alcohol use: No  . Drug use: No  . Sexual activity: Not Currently    Comment: 1st intercourse 85 yo-Fewer than 5 partners  Lifestyle  . Physical activity    Days per week: Not on file    Minutes per session: Not on file  . Stress: Not on file  Relationships  . Social Herbalist on phone: Not on file    Gets together: Not on file    Attends religious service: Not on file    Active member of club or organization: Not on file    Attends meetings of clubs or organizations: Not on file    Relationship status: Not on file  . Intimate partner violence    Fear of current or ex partner: Not on file    Emotionally abused: Not on file    Physically abused: Not on file    Forced sexual activity: Not on file  Other Topics Concern  . Not on file  Social History Narrative   Patient does not drink caffeine.   Patient is right handed.     Allergies  Allergen Reactions  . Demerol [Meperidine] Other (See Comments)    Excessive sweating, cramping  . Other     No Antibiotic without consultation with her primary physician.  . Codeine Rash  . Sulfa Antibiotics Itching and Other (See Comments)    Reaction unknown  . Tomato Itching    Family History   Problem Relation Age of Onset  . Hypertension Father   . Heart attack Father   . Stroke Brother   . Stroke Maternal Grandmother     Prior to Admission medications   Medication Sig Start Date End Date Taking? Authorizing Provider  aspirin (ECOTRIN LOW STRENGTH) 81 MG EC tablet Take 81 mg by mouth daily. Swallow whole.   Yes [provider]  cephALEXin (KEFLEX) 500 MG capsule Take 1 capsule (500 mg total) by mouth every 12 (twelve) hours for 3 days. 02/13/19 02/16/19 Yes Dhungel, Nishant, MD  cholecalciferol (VITAMIN D) 1000 units tablet Take 1,000 Units  by mouth daily.   Yes [provider]  clopidogrel (PLAVIX) 75 MG tablet Take 75 mg by mouth daily.   Yes [provider]  feeding supplement, ENSURE ENLIVE, (ENSURE ENLIVE) LIQD Take 237 mLs by mouth 2 (two) times daily between meals. 02/13/19  Yes Dhungel, Nishant, MD  folic acid (FOLVITE) 1 MG tablet Take 1 mg by mouth daily.   Yes [provider]  Hydrocortisone (GERHARDT'S BUTT CREAM) CREA Apply 1 application topically 3 (three) times daily. Apply barrier cream to buttocks/sacrum TID and PRN with each incontinent cleaning session 01/31/19 03/02/19 Yes Ghimire, Henreitta Leber, MD  latanoprost (XALATAN) 0.005 % ophthalmic solution Place 1 drop into both eyes at bedtime.    Yes [provider]  megestrol (MEGACE) 400 MG/10ML suspension Take 10 mLs (400 mg total) by mouth 3 (three) times daily. 01/31/19 03/02/19 Yes Ghimire, Henreitta Leber, MD  mirtazapine (REMERON SOL-TAB) 45 MG disintegrating tablet Take 1 tablet (45 mg total) by mouth at bedtime. 01/31/19  Yes Ghimire, Henreitta Leber, MD  Multiple Vitamin (MULTIVITAMIN WITH MINERALS) TABS tablet Take 1 tablet by mouth daily. 02/01/19  Yes Ghimire, Henreitta Leber, MD  nutrition supplement, JUVEN, (JUVEN) PACK Take 1 packet by mouth 2 (two) times daily between meals. 02/13/19  Yes Dhungel, Nishant, MD  nystatin (MYCOSTATIN/NYSTOP) powder Apply topically 2 (two) times daily.  Apply to affected area on the skin folds 01/31/19  Yes Ghimire, Henreitta Leber, MD  acetaminophen (TYLENOL) 650 MG suppository Place 1 suppository (650 mg total) rectally every 6 (six) hours as needed for mild pain (or Fever >/= 101). Patient not taking: Reported on 02/03/2019 02/13/19   Dhungel, Flonnie Overman, MD  Amino Acids-Protein Hydrolys (FEEDING SUPPLEMENT, PRO-STAT SUGAR FREE 64,) LIQD Take 30 mLs by mouth 2 (two) times daily with a meal. Patient not taking: Reported on 02/02/2019 01/31/19 03/02/19  Jonetta Osgood, MD  loperamide (IMODIUM) 2 MG capsule Take 1 capsule (2 mg total) by mouth as needed for diarrhea or loose stools. Patient not taking: Reported on 02/17/2019 01/31/19   Jonetta Osgood, MD  pantoprazole (PROTONIX) 20 MG tablet Take 20 mg by mouth daily. 12/09/18   [provider]  traMADol (ULTRAM) 50 MG tablet Take 0.5 tablets (25 mg total) by mouth every 12 (twelve) hours as needed for moderate pain. 01/31/19   Ghimire, Henreitta Leber, MD    Physical Exam: Patient resting in bed chronically ill appearing, does not follow any commands or answer any questions appropriately.  When called her name she looked to the right and had a frown on her forehead which the daughter said was due to pain from the sacral decubitus.  Vitals:   02/11/2019 1050 02/20/2019 1052 02/14/2019 1137  BP: 138/78  133/75  Pulse: 94  84  Resp: (!) 22  17  Temp: 98.8 F (37.1 C)    TempSrc: Rectal    Weight:  46.9 kg   Height:  5\' 3"  (1.6 m)      . General: Chronically ill-appearing elderly female . Eyes: PERRL, EOMI, normal lids, iris . ENT: grossly normal hearing, lips & tongue, mmm . Neck: no LAD, masses or thyromegaly . Cardiovascular: RRR, no m/r/g. No LE edema.  Marland Kitchen Respiratory: Diminished breath sounds at the bases bilaterally, no w/r/r. Normal respiratory effort. . Abdomen:  soft, ntnd, NABS . Skin:  no rash or induration seen on limited exam . Musculoskeletal: grossly normal tone BUE/BLE, good ROM,  no bony abnormality . Neurologic: CN 2-12 grossly intact, moves all  extremities, not able to follow any commands.  Labs on Admission: I have personally reviewed following labs and imaging studies  CBC: Recent Labs  Lab 02/10/19 0813 01/28/2019 1103  WBC 9.6 14.6*  HGB 8.3* 10.5*  HCT 26.0* 33.3*  MCV 100.4* 102.8*  PLT 176 [id number]   Basic Metabolic Panel: Recent Labs  Lab 02/10/19 0813 02/11/19 0746 02/02/2019 1103  NA 137 137 134*  K 3.1* 3.2* 3.7  CL 108 107 103  CO2 23 21* 18*  GLUCOSE 91 106* 72  BUN 21 23 31*  CREATININE 0.76 0.87 1.22*  CALCIUM 7.8* 8.0* 8.1*  MG  --  1.9  --    GFR: Estimated Creatinine Clearance: 24.5 mL/min (A) (by C-G formula based on SCr of 1.22 mg/dL (H)). Liver Function Tests: No results for input(s): AST, ALT, ALKPHOS, BILITOT, PROT, ALBUMIN in the last 168 hours. No results for input(s): LIPASE, AMYLASE in the last 168 hours. No results for input(s): AMMONIA in the last 168 hours. Coagulation Profile: No results for input(s): INR, PROTIME in the last 168 hours. Cardiac Enzymes: No results for input(s): CKTOTAL, CKMB, CKMBINDEX, TROPONINI in the last 168 hours. BNP (last 3 results) No results for input(s): PROBNP in the last 8760 hours. HbA1C: No results for input(s): HGBA1C in the last 72 hours. CBG: Recent Labs  Lab 02/12/19 1150 02/12/19 1723 02/13/19 0030 02/13/19 0543 02/13/19 1216  GLUCAP 92 108* 106* 110* 107*   Lipid Profile: No results for input(s): CHOL, HDL, LDLCALC, TRIG, CHOLHDL, LDLDIRECT in the last 72 hours. Thyroid Function Tests: No results for input(s): TSH, T4TOTAL, FREET4, T3FREE, THYROIDAB in the last 72 hours. Anemia Panel: No results for input(s): VITAMINB12, FOLATE, FERRITIN, TIBC, IRON, RETICCTPCT in the last 72 hours. Urine analysis:    Component Value Date/Time   COLORURINE YELLOW 01/08/2019 2015   APPEARANCEUR HAZY (A) 01/08/2019 2015   LABSPEC 1.018 01/08/2019 2015   PHURINE 6.0 01/08/2019 2015    GLUCOSEU NEGATIVE 01/08/2019 2015   HGBUR SMALL (A) 01/08/2019 2015   BILIRUBINUR NEGATIVE 01/08/2019 2015   KETONESUR NEGATIVE 01/08/2019 2015   PROTEINUR NEGATIVE 01/08/2019 2015   UROBILINOGEN 0.2 21-Jan-202014 1048   NITRITE NEGATIVE 01/08/2019 2015   LEUKOCYTESUR NEGATIVE 01/08/2019 2015    Creatinine Clearance: Estimated Creatinine Clearance: 24.5 mL/min (A) (by C-G formula based on SCr of 1.22 mg/dL (H)).  Sepsis Labs: @LABRCNTIP (procalcitonin:4,lacticidven:4) )No results found for this or any previous visit (from the past 240 hour(s)).   Radiological Exams on Admission: Ct Chest Wo Contrast  Result Date: 02/10/2019 CLINICAL DATA:  Hypoxia, pleural effusion. EXAM: CT CHEST WITHOUT CONTRAST TECHNIQUE: Multidetector CT imaging of the chest was performed following the standard protocol without IV contrast. COMPARISON:  Radiograph of same day. FINDINGS: Cardiovascular: Atherosclerosis of thoracic aorta is noted without aneurysm formation. Normal cardiac size. No pericardial effusion. Mediastinum/Nodes: No enlarged mediastinal or axillary lymph nodes. Thyroid gland, trachea, and esophagus demonstrate no significant findings. Lungs/Pleura: No pneumothorax is noted. Large bilateral pleural effusions are noted, left greater than right. There is complete atelectasis of the left lower lobe with a large amount of abnormal soft tissue density seen in the left mainstem bronchus and lower lobe bronchi. This is concerning for aspiration or mucous plugging. Bronchoscopy is recommended. Right upper lobe interstitial opacity is noted concerning for focal inflammation or atelectasis. Upper Abdomen: No acute abnormality. Musculoskeletal: No chest wall mass or suspicious bone lesions identified. IMPRESSION: Large bilateral pleural effusions are noted, left greater than right. Complete atelectasis of left  lower lobe is noted with a large amount of abnormal soft tissue density seen in the left mainstem bronchus  and lower lobe bronchi, concerning for aspirated material or mucous plugging. Bronchoscopy is recommended for further evaluation. Mild interstitial density is noted in right upper lobe concerning for focal inflammation or atelectasis. Aortic Atherosclerosis (ICD10-I70.0). Electronically Signed   By: Marijo Conception M.D.   On: 01/28/2019 15:10   Dg Chest Portable 1 View  Result Date: 02/01/2019 CLINICAL DATA:  Shortness of breath, weakness and low oxygen saturation. EXAM: PORTABLE CHEST 1 VIEW COMPARISON:  Chest x-ray 02/07/2019 FINDINGS: The heart is mildly enlarged but stable. Stable tortuosity and calcification of the thoracic aorta. Persistent vague right upper lobe opacity but the rest of the right lung demonstrates improved aeration when compared to prior studies. There is diffuse hazy opacity of the left lung which could be due to progressive left lower lobe atelectasis and left pleural effusion. The bony structures are unremarkable. IMPRESSION: 1. Persistent vague right upper lobe lung opacity, likely persistent infiltrate. 2. Loss of volume in the left hemithorax possibly due to progressive left lower lobe atelectasis and/or pleural effusion. CT may be helpful for further evaluation. Electronically Signed   By: Marijo Sanes M.D.   On: 02/10/2019 12:30    Assessment/Plan Active Problems:   HCAP (healthcare-associated pneumonia)    #1 acute hypoxic respiratory failure secondary to bilateral pleural effusion left greater than right, atelectasis of the left lower lobes concern for mucous plug versus aspiration.  Patient has no symptoms other than hypoxia.  Patient has diastolic dysfunction severe malnutrition and possible aspiration.  No fever chills cough shortness of breath or dyspnea on exertion. ABG 7.4 2/29/66 on 6 L of oxygen.  Unable to obtain pulse ox in the ER.  She has leukocytosis 14.6.  I will place her on Flagyl for now for possible aspiration.  I discussed with Dr. Johnnye Sima if you  need consult please reconsult him tomorrow.  I will hold off on any other antibiotics.  He did recommend that if patient needs broad-spectrum antibiotics to start her on vancomycin 125 mg daily. Consulted IR for thoracentesis.  Discussed with daughter who wants to have everything done. I am holding off on Lasix as patient appears more dehydrated with AKI. Covid pending.   #2  AKI patient does appear dry however with bilateral pleural effusion I will hold off on IV fluids.  #3 history of C. difficile colitis with stool transplant it looks like her last C. difficile colitis was in 2018.  She has no worsening of her chronic diarrhea at this time.  Off on restarting her PPI.  #4 severe protein calorie malnutrition with failure to thrive daughter wants everything done but does not want PEG tube or NG tube or feeding any kind of feeding tube.  Continue megace and Remeron.  #5 sacral decubitus ulcer present on admission she had hydrotherapy and Santyl last admission will consult wound care.  #6 anemia of chronic disease no evidence of active bleeding noted follow-up CBC.  #7 history of TIA continue aspirin and Plavix  #8 essential hypertension stable  #9 glaucoma continue Xalatan eyedrops  #10 decreased appetite continue Remeron  Severity of Illness: The appropriate patient status for this patient is INPATIENT. Inpatient status is judged to be reasonable and necessary in order to provide the required intensity of service to ensure the patient's safety. The patient's presenting symptoms, physical exam findings, and initial radiographic and laboratory data in the context  of their chronic comorbidities is felt to place them at high risk for further clinical deterioration. Furthermore, it is not anticipated that the patient will be medically stable for discharge from the hospital within 2 midnights of admission. The following factors support the patient status of inpatient.   " The patient's  presenting symptoms include hypoxia. " The worrisome physical exam findings include hypoxia, bilateral pleural effusion " The initial radiographic and laboratory data are worrisome because of elevated creatinine, large pleural effusion left greater than right possible aspiration versus mucous plug " The chronic co-morbidities include failure to thrive severe protein calorie malnutrition C. difficile colitis, decubitus ulcer   * I certify that at the point of admission it is my clinical judgment that the patient will require inpatient hospital care spanning beyond 2 midnights from the point of admission due to high intensity of service, high risk for further deterioration and high frequency of surveillance required.*   RN Pressure Injury Documentation: Pressure Injury 02/02/19 Buttocks Left Unstageable - Full thickness tissue loss in which the base of the ulcer is covered by slough (yellow, tan, gray, green or brown) and/or eschar (tan, brown or black) in the wound bed. (Active)  02/02/19 2213  Location: Buttocks  Location Orientation: Left  Staging: Unstageable - Full thickness tissue loss in which the base of the ulcer is covered by slough (yellow, tan, gray, green or brown) and/or eschar (tan, brown or black) in the wound bed.  Wound Description (Comments):   Present on Admission: Yes     Estimated body mass index is 18.32 kg/m as calculated from the following:   Height as of this encounter: 5\' 3"  (1.6 m).   Weight as of this encounter: 46.9 kg.   DVT prophylaxis: Lovenox Code Status: Full code per her daughter Family Communication: Discussed with her daughter Milea Verni Disposition Plan: Pending clinical improvement Consults called: Discussed with Dr. Johnnye Sima if need formal consult reconsult him in the morning Admission status: Inpatient   Georgette Shell MD [facility]  If 7PM-7AM, please contact night-coverage [email or web address] Password [facility]  02/22/2019, 4:35  PM

## 2019-02-15 NOTE — Progress Notes (Signed)
ABG obtained by Dr. Tomi Bamberger; blood sent to lab and lab notified.

## 2019-02-16 ENCOUNTER — Inpatient Hospital Stay (HOSPITAL_COMMUNITY): Payer: Medicare Other

## 2019-02-16 DIAGNOSIS — J90 Pleural effusion, not elsewhere classified: Secondary | ICD-10-CM

## 2019-02-16 LAB — COMPREHENSIVE METABOLIC PANEL
ALT: 9 U/L (ref 0–44)
AST: 31 U/L (ref 15–41)
Albumin: 2.4 g/dL — ABNORMAL LOW (ref 3.5–5.0)
Alkaline Phosphatase: 102 U/L (ref 38–126)
Anion gap: 15 (ref 5–15)
BUN: 33 mg/dL — ABNORMAL HIGH (ref 8–23)
CO2: 18 mmol/L — ABNORMAL LOW (ref 22–32)
Calcium: 8.3 mg/dL — ABNORMAL LOW (ref 8.9–10.3)
Chloride: 105 mmol/L (ref 98–111)
Creatinine, Ser: 1.24 mg/dL — ABNORMAL HIGH (ref 0.44–1.00)
GFR calc Af Amer: 46 mL/min — ABNORMAL LOW (ref >60)
GFR calc non Af Amer: 39 mL/min — ABNORMAL LOW (ref >60)
Glucose, Bld: 49 mg/dL — ABNORMAL LOW (ref 70–99)
Potassium: 4.6 mmol/L (ref 3.5–5.1)
Sodium: 138 mmol/L (ref 135–145)
Total Bilirubin: 1.8 mg/dL — ABNORMAL HIGH (ref 0.3–1.2)
Total Protein: 6.3 g/dL — ABNORMAL LOW (ref 6.5–8.1)

## 2019-02-16 LAB — CBC
HCT: 33.8 % — ABNORMAL LOW (ref 36.0–46.0)
Hemoglobin: 10.9 g/dL — ABNORMAL LOW (ref 12.0–15.0)
MCH: 32.5 pg (ref 26.0–34.0)
MCHC: 32.2 g/dL (ref 30.0–36.0)
MCV: 100.9 fL — ABNORMAL HIGH (ref 80.0–100.0)
Platelets: 171 10*3/uL (ref 150–400)
RBC: 3.35 MIL/uL — ABNORMAL LOW (ref 3.87–5.11)
RDW: 20.9 % — ABNORMAL HIGH (ref 11.5–15.5)
WBC: 15.1 10*3/uL — ABNORMAL HIGH (ref 4.0–10.5)
nRBC: 0.2 % (ref 0.0–0.2)

## 2019-02-16 LAB — BLOOD GAS, ARTERIAL
Acid-base deficit: 6 mmol/L — ABNORMAL HIGH (ref 0.0–2.0)
Bicarbonate: 17.9 mmol/L — ABNORMAL LOW (ref 20.0–28.0)
O2 Saturation: 97.3 %
Patient temperature: 98.6
pCO2 arterial: 31.6 mmHg — ABNORMAL LOW (ref 32.0–48.0)
pH, Arterial: 7.373 (ref 7.350–7.450)
pO2, Arterial: 102 mmHg (ref 83.0–108.0)

## 2019-02-16 MED ORDER — ENSURE ENLIVE PO LIQD: 237.0000 mL | Freq: Two times a day (BID) | ORAL | Status: DC

## 2019-02-16 MED ORDER — PRO-STAT SUGAR FREE PO LIQD
30.0000 mL | Freq: Two times a day (BID) | ORAL | Status: DC
  Filled 2019-02-16: qty 30

## 2019-02-16 MED ORDER — ADULT MULTIVITAMIN W/MINERALS CH
1.0000 | ORAL_TABLET | Freq: Every day | ORAL | Status: DC
  Administered 2019-02-16: 1 via ORAL
  Filled 2019-02-16 (×2): qty 1

## 2019-02-16 MED ORDER — JUVEN PO PACK
1.0000 | PACK | Freq: Two times a day (BID) | ORAL | Status: DC
  Filled 2019-02-16 (×3): qty 1

## 2019-02-16 MED ORDER — FOLIC ACID 1 MG PO TABS
1.0000 mg | ORAL_TABLET | Freq: Every day | ORAL | Status: DC
  Administered 2019-02-16: 1 mg via ORAL
  Filled 2019-02-16: qty 1

## 2019-02-16 MED ORDER — ACETAMINOPHEN 650 MG RE SUPP
650.0000 mg | Freq: Four times a day (QID) | RECTAL | Status: DC | PRN
  Administered 2019-02-16 – 2019-02-17 (×3): 650 mg via RECTAL
  Filled 2019-02-16 (×5): qty 1

## 2019-02-16 MED ORDER — LIDOCAINE HCL 1 % IJ SOLN
INTRAMUSCULAR | Status: AC
  Filled 2019-02-16: qty 20

## 2019-02-16 MED ORDER — CEPHALEXIN 500 MG PO CAPS
500.0000 mg | ORAL_CAPSULE | Freq: Two times a day (BID) | ORAL | Status: AC
  Administered 2019-02-16 (×2): 500 mg via ORAL
  Filled 2019-02-16 (×2): qty 1

## 2019-02-16 NOTE — Progress Notes (Signed)
Sacral Wound dressing changed. Wound measured 5x5 and underlying 3. Minimal drainage noted to wound. Wound cleansed with NS and packed with wet NS Kerlix covered by wet gauze and Avelyn dressing.

## 2019-02-16 NOTE — Progress Notes (Signed)
Attempted to work with PT on Flutter device- PT daughter states she is in pain at this time after PT states she cannot do it at this time. Previous RT attempted to complete chest vest and was denied by daughter due to scheduled thoracentesis.

## 2019-02-16 NOTE — Progress Notes (Signed)
Patient's [name] notified the RN of the patient's condition.

## 2019-02-16 NOTE — Progress Notes (Signed)
Called to pt bedside due to poor pulseox reading.  Abg drawn on 4L nasal cannula, with the following results:  Results for Sydney [name], Sydney [name] (MRN [id number]) as of 02/16/2019 06:04  Ref. Range 02/16/2019 05:48  pH, Arterial Latest Ref Range: 7.350 - 7.450  7.373  pCO2 arterial Latest Ref Range: 32.0 - 48.0 mmHg 31.6 (L)  pO2, Arterial Latest Ref Range: 83.0 - 108.0 mmHg 102  Acid-base deficit Latest Ref Range: 0.0 - 2.0 mmol/L 6.0 (H)  Bicarbonate Latest Ref Range: 20.0 - 28.0 mmol/L 17.9 (L)  O2 Saturation Latest Units: % 97.3  Patient temperature Unknown 98.6  Allens test (pass/fail) Latest Ref Range: PASS  PASS

## 2019-02-16 NOTE — Progress Notes (Signed)
IR consulted by Dr. Rodena Piety for possible image-guided thoracentesis.  Consent obtained by patient's daughter, Sydney [name], via telephone.   Patient was brought down for procedure and limited chest [place] was preformed which revealed a moderate size pleural effusion amenable to percutaneous drainage. However, during entire interaction patient was screaming and begging me not to preform procedure. In addition, she would not stay still laying in bed long enough to safely preform procedure today. Therefore, procedure did not occur. Recommend patient return to floor and attempt at thoracentesis again 02/17/2019 if patient more stable/agreeable for procedure to safely occur. Informed patient's daughter, Sydney [name], of above. Dr. Posey Pronto made aware and aware to place new order if thoracentesis still requested for 10/23. Dr. Kathlene Cote made aware, limited chest [place] sent to him for further review.  IR available in future if needed.   Bea Graff Louk, PA-C 02/16/2019, 12:19 PM

## 2019-02-16 NOTE — Progress Notes (Signed)
Initial Nutrition Assessment  DOCUMENTATION CODES:   Severe malnutrition in context of chronic illness, Underweight  INTERVENTION:   -Ensure Enlive po BID, each supplement provides 350 kcal and 20 grams of protein -Juven Fruit Punch BID, each serving provides 95kcal and 2.5g of protein (amino acids glutamine and arginine) -Prostat liquid protein PO 30 ml BID with meals, each supplement provides 100 kcal, 15 grams protein.  NUTRITION DIAGNOSIS:   Severe Malnutrition related to chronic illness(TIA, nonhealing sacral wound) as evidenced by severe fat depletion, severe muscle depletion, energy intake < or equal to 75% for > or equal to 1 month.  GOAL:   Patient will meet greater than or equal to 90% of their needs  MONITOR:   PO intake, Supplement acceptance, Labs, Weight trends, Skin, I & O's  REASON FOR ASSESSMENT:   Malnutrition Screening Tool    ASSESSMENT:   85 y.o. 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  Previous admissions: 12/19/18-01/31/19 02/02/19- 02/13/19  **RD working remotely**  Patient now readmitted for acute hypoxic respiratory failure. Pt continues with chronic diarrhea and per WOC note, stage 3 nonhealing sacral pressure injury. Family agreed for pt to have thoracentesis procedure today but pt adamantly refused. Family has stated in previous admissions that they do not want nutrition support started with patient. IR deemed that pt is not a candidate for PEG.  Pt has Ensure, Juven and Prostat supplements ordered for aid in wound healing. Pt is on a dysphagia 3 diet but no PO has been documented at this time. Pt did not eat well during previous admissions. Suspect this will continue.   Per weight records, pt weighs 103 lbs at admission, the same weight that was recorded on 10/15.  Uncertain if this is an accurate weight.   Labs reviewed. Medications: Folic acid tablet, Megace suspension, Remeron sol-tab, Multivitamin with minerals daily   NUTRITION - FOCUSED PHYSICAL EXAM:  Last NFPE performed 10/9, severe fat and muscle depletions documented. Suspect given no improvements in nutrition status and weight that depletions have not improved.  Diet Order:   Diet Order            DIET DYS 3 Room service appropriate? Yes; Fluid consistency: Thin  Diet effective now              EDUCATION NEEDS:   Not appropriate for education at this time  Skin:  Skin Integrity Issues:: Stage III Stage III: sacrum per WOC note -nonhealing  Last BM:  10/17 -most recently documented in chart  Height:   Ht Readings from Last 1 Encounters:  02/13/2019 5\' 3"  (1.6 m)    Weight:   Wt Readings from Last 1 Encounters:  02/25/2019 46.9 kg    Ideal Body Weight:  52.3 kg  BMI:  Body mass index is 18.32 kg/m.  Estimated Nutritional Needs:   Kcal:  1800-2000  Protein:  85-95g  Fluid:  1.8L/day   Clayton Bibles, MS, RD, LDN Inpatient Clinical Dietitian Pager: [phone] After Hours Pager: [phone]

## 2019-02-16 NOTE — Consult Note (Signed)
[facility] Nurse wound consult note Reason for Consult:Nonhealing wound to sacrum.  Began as an abscess and now has doubled in size due to debility and pressure.  Daughter cares for her at home.  Protein malnutrition noted.  Low oxygen saturation and failure to thrive are current reasons for hospitalizations.   Wound type:nonhealing full thickness wound.  Began as a tunneling abscess, is larger now and is consistent with stage 3 pressure injury Pressure Injury POA: Yes Measurement: 5 cm x 5 cm x 3 cm (tunneling present at 3 o'clock) Wound [id number] pink nongranulating with 10% fibrin present Drainage (amount, consistency, odor) minimal serosanguinous Periwound:intact Dressing procedure/placement/frequency: Cleanse sacral wound with NS and pat dry.  Fill wound tunneling and wound bed with small piece Aquacel Ag. Cover with dry gauze and ABD pad. Change TUes/Thurs/Sat and PRN soilage.  Will not follow at this time.  Please re-consult if needed.  Domenic Moras MSN, RN, FNP-BC CWON Wound, Ostomy, Continence Nurse Pager [phone]

## 2019-02-16 NOTE — Progress Notes (Signed)
[facility] Progress Note  Patient: Sydney [name] [id number]   PCP: [name], MD DOB: 1930-12-13   DOA: 02/13/2019   DOS: 02/16/2019   Date of Service: the patient was seen and examined on 02/16/2019  Chief Complaint  Patient presents with  . Low oxygen saturation   Brief hospital course: Sydney [name] is a 85 y.o. 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  Patient recently admitted to the hospital for decubitus ulcer discharged on Monday, 02/13/2019 to follow-up at the wound clinic.  Patient's daughter took her to the wound clinic today and there she was found to be hypoxic and was sent to the ER.  In the ER ABG was done with a PO2 of 60.  Patient does not have oxygen at home.  They were unable to get a pulse ox in the ER on arrival.  History was obtained from the daughter.  She denied any fever chills cough nausea vomiting  and has had no sick contacts.  She has chronic diarrhea. Daughter reports patient walked prior to admission to the hospital and wants everything done so that she can get back to her baseline and start walking again. She does not want her mother to get any sedative drugs or Ativan or narcotics.  Patient had multiple hospital admissions.  She was seen by physical therapy recommended SNF but daughter took her home instead. CT pelvis last admission shows soft tissue ulcer left coccyx with no evidence of osteomyelitis.  Patient has had prolonged hospitalization from 8/24 to 01/31/2019 for failure to thrive generalized weakness and weight loss.  She was seen by multiple subspecialist.  Daughter refused palliative care consult.  Ethics committee was involved in the past hospital stay.  Consulted last admission for pleural effusion recommended this was probably due to vascular  congestion.  Seen by ID for infectious decubitus ulcer.  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  Recommended possible biopsy but daughter refused.  Currently further plan is continue supportive care and further work-up for pleural effusion.  Subjective: Patient responds to the daughter when asked question but was minimally conversive with me.  Denies any acute complaint but moans in pain whenever she is trying to move in the bed.  No shortness of breath reported no cough reported.  No acute events overnight reported.  Assessment and Plan: #1 acute hypoxic respiratory failure secondary to bilateral pleural effusion left greater than right, atelectasis of the left lower lobes concern for mucous plug versus aspiration.  Patient has no symptoms other than hypoxia.  Patient has diastolic dysfunction severe malnutrition and possible aspiration.  No fever chills cough shortness of breath or dyspnea on exertion. ABG 7.4 2/29/66 on 6 L of oxygen.   She has leukocytosis 14.6.  Admitting provider discussed with Dr. Johnnye Sima from ID and started the patient on IV Flagyl for possible aspiration. I will finish patient's current course of oral Keflex which she was deemed to finish today. hold off on any other antibiotics.   He did recommend that if patient needs broad-spectrum antibiotics to start her on vancomycin 125 mg daily. Consulted IR for thoracentesis.  Discussed with daughter who wants to have everything done.  Patient was not able to tolerate thoracentesis on 02/16/2019 and plan is to consider Lasix albumin combination with some IV fluids tomorrow versus sedation and  thoracentesis tomorrow.  Daughter will let us know about what she decides tomorrow morning on 02/17/2019.  #2  AKI Prerenal etiology baseline serum creatinine 0.8. Current serum creatinine 1.2. CrCl is 24. However with bilateral pleural effusion I will hold off on IV fluids.  #3 history of C. difficile colitis with  stool transplant it looks like her last C. difficile colitis was in 2018.  She has no worsening of her chronic diarrhea at this time.    Holding off on restarting her PPI.  #4 severe protein calorie malnutrition with failure to thrive daughter wants everything done but does not want PEG tube or NG tube or feeding any kind of feeding tube.  Continue megace and Remeron.  #5 sacral decubitus ulcer present on admission she had hydrotherapy and Santyl last admission will consult wound care.  #6 anemia of chronic disease no evidence of active bleeding noted follow-up CBC.  #7 history of TIA continue aspirin and Plavix  #8 essential hypertension stable  #9 glaucoma continue Xalatan eyedrops  #10 decreased appetite continue Remeron   Goals of care discussion Had an extensive discussion with patient's daughter twice 1 personally face-to-face and once on the phone. Explained to daughter regarding patient's poor prognosis. Patient has poor wound healing with stage IV sacral decubitus ulcer, C. difficile colitis requiring fecal transplant.  Evidence of failure to thrive with progressive deconditioning and eventually bedbound condition.  Also prior history of TIA chronic diastolic CHF. This time presents with worsening bilateral pleural effusion requiring oxygen as well as mucous plugging/food debris in her left bronchus suggesting poor cough clearance secondary to deconditioning. Patient also has evidence of clinical dehydration with acute kidney injury. Explained to daughter that this is all evidence of progressive decline.  Explained to her that while we are waiting for improvement in her strength and ability as well as oral intake patient will succumb to all these other comorbidities. I explained that the patient is suffering from pain as well as suffering from difficulty breathing secondary to her pleural effusion and we are not providing her any assistance which is not appropriate. Daughter  explained to the patient is actually not asking for any pain medication daughter explained that she is following her mother's wishes when she wants to continue aggressive care.  Daughter also explains that her mother has a lot of willpower and daughter believes it restoration of health.  While she understand the patient is very weak to tolerate chest physical therapy or even flutter valve and also understands that using sedation for thoracentesis may not be the best option for the patient she wants to pursue these options if possible. When offered her regarding focusing more comfort with palliative care approach versus comfort care approach with hospice she states that going home and doing nothing/no physical therapy, no wound care and just giving pain medication is not acceptable to her. I explained to her that at this point whether we do any aggressive therapy or whether we do not do any aggressive therapy the end result will be same given patient's progression of illness.  Daughter mentions that the patient enjoys time at home, was more awake more perky and was eating more at home as well.  Was also working with physical therapy more at home.  I explained that given the patient's progression patient is more likely to spend more time in the hospital than at home if the goal is to treat what is treatable and remain aggressive. I also explained to the daughter  regarding the difference between hospice and DNR with the concept of treat what is treatable. She tells me that she needs to process all this information and appreciated my time and effort in explaining.   Body mass index is 18.32 kg/m.  Nutrition Problem: Severe Malnutrition Etiology: chronic illness(TIA, nonhealing sacral wound) Interventions: Interventions: Ensure Enlive (each supplement provides 350kcal and 20 grams of protein), Juven, Prostat   Left buttock Pressure ulcer, POA.  Wound care consulted.  Pressure Injury 02/07/2019 Buttocks Left  Unstageable - Full thickness tissue loss in which the base of the ulcer is covered by slough (yellow, tan, gray, green or brown) and/or eschar (tan, brown or black) in the wound bed. (Active)  02/01/2019 2300  Location: Buttocks  Location Orientation: Left  Staging: Unstageable - Full thickness tissue loss in which the base of the ulcer is covered by slough (yellow, tan, gray, green or brown) and/or eschar (tan, brown or black) in the wound bed.  Wound Description (Comments):   Present on Admission: Yes     Diet: Dysphagia type3 thin liquid DVT Prophylaxis: Subcutaneous Lovenox  Advance goals of care discussion: Full code  Family Communication: family was present at bedside, at the time of interview. The pt provided permission to discuss medical plan with the family. Opportunity was given to ask question and all questions were answered satisfactorily.   Disposition:  Discharge to be determined.  Consultants: none Procedures: none  Scheduled Meds: . acetaminophen  1,000 mg Oral TID  . aspirin EC  81 mg Oral Daily  . cephALEXin  500 mg Oral Q12H  . clopidogrel  75 mg Oral Daily  . enoxaparin (LOVENOX) injection  30 mg Subcutaneous Q24H  . feeding supplement (ENSURE ENLIVE)  237 mL Oral BID BM  . feeding supplement (PRO-STAT SUGAR FREE 64)  30 mL Oral BID WC  . folic acid  1 mg Oral Daily  . latanoprost  1 drop Both Eyes QHS  . lidocaine      . megestrol  400 mg Oral BID  . mirtazapine  45 mg Oral QHS  . multivitamin with minerals  1 tablet Oral Daily  . nutrition supplement (JUVEN)  1 packet Oral BID BM   Continuous Infusions: . metronidazole 500 mg (02/16/19 1704)   PRN Meds: acetaminophen, traMADol Antibiotics: Anti-infectives (From admission, onward)   Start     Dose/Rate Route Frequency Ordered Stop   02/16/19 1500  cephALEXin (KEFLEX) capsule 500 mg     500 mg Oral Every 12 hours 02/16/19 1358 02/17/19 0959   01/28/2019 1800  metroNIDAZOLE (FLAGYL) IVPB 500 mg     500  mg 100 mL/hr over 60 Minutes Intravenous Every 8 hours 01/28/2019 1723     02/21/2019 1715  vancomycin (VANCOCIN) 50 mg/mL oral solution 125 mg  Status:  Discontinued     125 mg Oral Daily 02/11/2019 1710 02/10/2019 1728       Objective: Physical Exam: Vitals:   02/24/2019 1800 02/19/2019 1946 02/16/19 0417 02/16/19 0848  BP: 128/74 140/79 127/71 120/72  Pulse:  94 79 88  Resp: 15 (!) 22 16 16   Temp:  98.1 F (36.7 C) 98 F (36.7 C) 97.6 F (36.4 C)  TempSrc:  Oral Oral Oral  SpO2:  99% (!) 74%   Weight:      Height:        Intake/Output Summary (Last 24 hours) at 02/16/2019 1922 Last data filed at 02/16/2019 0400 Gross per 24 hour  Intake 36.27 ml  Output -  Net 36.27 ml   Filed Weights   01/26/2019 1052  Weight: 46.9 kg   General: alert and oriented to place and person. Appear in mild distress, affect flat in affect Eyes: PERRL, Conjunctiva normal ENT: Oral Mucosa shows Dry erythematous skin and mucosa  Neck: difficult to assess  JVD, no Abnormal Mass Or lumps Cardiovascular: S1 and S2 Present, aortic systolic Murmur, peripheral pulses symmetrical Respiratory: good respiratory effort, Bilateral Air entry equal and Decreased, no signs of accessory muscle use, bilateral Crackles, no wheezes Abdomen: Bowel Sound present, Soft and difficult to assess  tenderness, no hernia Skin: no rashes  Extremities: no Pedal edema, no calf tenderness Neurologic: without any new focal findings Gait not checked due to patient safety concerns  Data Reviewed: I have personally reviewed and interpreted daily labs, tele strips, imagings as discussed above. I reviewed all nursing notes, pharmacy notes, vitals, pertinent old records I have discussed plan of care as described above with RN and patient/family.  CBC: Recent Labs  Lab 02/10/19 0813 02/03/2019 1103 02/16/19 0505  WBC 9.6 14.6* 15.1*  HGB 8.3* 10.5* 10.9*  HCT 26.0* 33.3* 33.8*  MCV 100.4* 102.8* 100.9*  PLT 176 197 [id number]   Basic  Metabolic Panel: Recent Labs  Lab 02/10/19 0813 02/11/19 0746 02/04/2019 1103 02/16/19 0505  NA 137 137 134* 138  K 3.1* 3.2* 3.7 4.6  CL 108 107 103 105  CO2 23 21* 18* 18*  GLUCOSE 91 106* 72 49*  BUN 21 23 31* 33*  CREATININE 0.76 0.87 1.22* 1.24*  CALCIUM 7.8* 8.0* 8.1* 8.3*  MG  --  1.9  --   --     Liver Function Tests: Recent Labs  Lab 02/16/19 0505  AST 31  ALT 9  ALKPHOS 102  BILITOT 1.8*  PROT 6.3*  ALBUMIN 2.4*   No results for input(s): LIPASE, AMYLASE in the last 168 hours. No results for input(s): AMMONIA in the last 168 hours. Coagulation Profile: No results for input(s): INR, PROTIME in the last 168 hours. Cardiac Enzymes: No results for input(s): CKTOTAL, CKMB, CKMBINDEX, TROPONINI in the last 168 hours. BNP (last 3 results) No results for input(s): PROBNP in the last 8760 hours. CBG: Recent Labs  Lab 02/12/19 1150 02/12/19 1723 02/13/19 0030 02/13/19 0543 02/13/19 1216  GLUCAP 92 108* 106* 110* 107*   Studies: [place] Chest (pleural Effusion)  Result Date: 02/16/2019 CLINICAL DATA:  Pleural effusions. EXAM: CHEST ULTRASOUND COMPARISON:  CT of the chest on 02/10/2019 FINDINGS: The patient was brought down for a planned left thoracentesis. Ultrasound demonstrates a moderate left pleural effusion. While preparing to perform the procedure, the patient became combative, agitated and refused to have the procedure performed. It was not safe to proceed with attempted thoracentesis today. IMPRESSION: Aborted left thoracentesis procedure due to lack of patient cooperation and refusal to have the procedure performed. Electronically Signed   By: Aletta Edouard M.D.   On: 02/16/2019 12:55     Time spent: 35 minutes  Author: Berle Mull, MD [facility] 02/16/2019 7:22 PM  To reach On-call, see care teams to locate the attending and reach out to them via [email or web address]. If 7PM-7AM, please contact night-coverage If you still have difficulty reaching  the attending provider, please page the [facility] (Director on Call) for [facility] on amion for assistance.

## 2019-02-16 NOTE — TOC Initial Note (Signed)
Transition of Care [facility]) - Initial/Assessment Note    Patient Details  Name: Sydney [name] MRN: [id number] Date of Birth: Jun 07, 1930  Transition of Care [facility]) CM/SW Contact:    Dessa Phi, RN Phone Number: 02/16/2019, 2:08 PM  Clinical Narrative: Active w/[facility] following. Full code. High risk for readmission-recc palliative cons-if family agree.                Expected Discharge Plan: [place] Barriers to Discharge: Continued Medical Work up   Patient Goals and CMS Choice   CMS Medicare.gov Compare Post Acute Care list provided to:: Patient Represenative (must comment)(dtr Angela [phone]) Choice offered to / list presented to : Adult Children  Expected Discharge Plan and Services Expected Discharge Plan: [place]   Discharge Planning Services: CM Consult Post Acute Care Choice: [place] arrangements for the past 2 months: Single Family Home Expected Discharge Date: (unknown)                                    Prior Living Arrangements/Services Living arrangements for the past 2 months: Single Family Home Lives with:: Adult Children Patient language and need for interpreter reviewed:: Yes Do you feel safe going back to the place where you live?: Yes      Need for Family Participation in Patient Care: Yes (Comment) Care giver support system in place?: Yes (comment) Current home services: Home OT, Home PT, Home RN([facility] active) Criminal Activity/Legal Involvement Pertinent to Current Situation/Hospitalization: No - Comment as needed  Activities of Daily Living Home Assistive Devices/Equipment: Hospital bed, Other (Comment), [employer], Dentures (specify type), Eyeglasses, Grab bars in shower, Walker (specify type)(air mattress, front wheeled walker, upper denture) ADL Screening (condition at time of admission) Patient's cognitive ability adequate to safely complete daily activities?: No Is the patient deaf  or have difficulty hearing?: No Does the patient have difficulty seeing, even when wearing glasses/contacts?: Yes Does the patient have difficulty concentrating, remembering, or making decisions?: Yes Patient able to express need for assistance with ADLs?: No Does the patient have difficulty dressing or bathing?: Yes Independently performs ADLs?: No Communication: Independent Dressing (OT): Needs assistance Is this a change from baseline?: Pre-admission baseline Grooming: Needs assistance Is this a change from baseline?: Pre-admission baseline Feeding: Needs assistance Is this a change from baseline?: Pre-admission baseline Bathing: Needs assistance Is this a change from baseline?: Pre-admission baseline Toileting: Needs assistance Is this a change from baseline?: Pre-admission baseline In/Out Bed: Needs assistance Is this a change from baseline?: Pre-admission baseline Walks in Home: Needs assistance Is this a change from baseline?: Pre-admission baseline Does the patient have difficulty walking or climbing stairs?: Yes(secondary to weakness) Weakness of Legs: Both Weakness of Arms/Hands: None  Permission Sought/Granted Permission sought to share information with : Case Manager Permission granted to share information with : Yes, Verbal Permission Granted              Emotional Assessment Appearance:: Appears stated age Attitude/Demeanor/Rapport: Gracious Affect (typically observed): Accepting Orientation: : Oriented to Self, Oriented to Place, Oriented to  Time, Oriented to Situation Alcohol / Substance Use: Not Applicable Psych Involvement: No (comment)  Admission diagnosis:  Hypoxemia [R09.02] Pleural effusion [J90] Hypoxia [R09.02] Patient Active Problem List   Diagnosis Date Noted  . HCAP (healthcare-associated pneumonia) 02/05/2019  . Pleural effusion   . Hypoxia   . Hypoxemia   .  Pressure ulcer with full thickness skin loss involving damage or necrosis of  subcutaneous tissue ([place]) 02/13/2019  . TIA (transient ischemic attack)   . PVD (peripheral vascular disease) ([place])   . SOB (shortness of breath)   . Wheezing   . Sacral ulcer ([place]) 02/02/2019  . Abdominal pain   . B12 deficiency   . Loose stools   . MDD (major depressive disorder) 01/12/2019  . Failure to thrive in adult 01/02/2019  . Evaluation by psychiatric service required   . Malnutrition of moderate degree 12/20/2018  . Elevated LFTs   . Near syncope   . General weakness 12/17/2018  . Generalized weakness 12/16/2018  . Pressure injury of skin 12/16/2018  . Transaminitis 12/16/2018  . Ataxia 10/02/2017  . History of TIA (transient ischemic attack) 10/02/2017  . HTN (hypertension) 10/02/2017  . HLD (hyperlipidemia) 10/02/2017  . Palpable mass of neck-?? lymph node 10/02/2017  . Protein-calorie malnutrition, severe 10/20/2016  . Hypertension 10/17/2016  . High cholesterol 10/17/2016  . History of Clostridioides difficile colitis 10/17/2016  . Dehydration with hyponatremia 10/17/2016  . Acute hypokalemia 10/17/2016  . Protein calorie malnutrition ([place]) 10/17/2016  . C. difficile colitis 10/05/2016  . Physical deconditioning 10/05/2016  . Recurrent Clostridium difficile diarrhea 10/05/2016  . Balance problem 10/05/2016  . History of transient ischemic attack (TIA) 10/05/2016  . Dehydration   . Hypokalemia 05/14/2016  . Anemia 05/14/2016  . Hyponatremia 05/14/2016  . Glaucoma 05/14/2016  . Meningioma ([place]) 05/01/2015  . 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 Diplopia 04/08/2015  . Pancytopenia ([place]) 04/08/2015  . Bradycardia 02/17/2013  . Occlusion and stenosis of carotid artery without mention of cerebral infarction 02/17/2013   PCP:  [name], MD Pharmacy:   [facility] [place], [place] - [place] [facility] [place] [address] [place] [address] Phone: [phone] Fax: [phone]     Social  Determinants of Health (SDOH) Interventions    Readmission Risk Interventions Readmission Risk Prevention Plan 02/13/2019 01/30/2019  Transportation Screening Complete Complete  PCP or Specialist Appt within 3-5 Days Complete -  HRI or Home Care Consult Complete -  Social Work Consult for [place] Planning/Counseling Complete -  Palliative Care Screening Not Applicable -  Medication Review Press photographer) Complete Complete  PCP or Specialist appointment within 3-5 days of discharge - Complete  HRI or [place] - Complete  SW Recovery Care/Counseling Consult - Complete  [place] - Patient Refused  Some recent data might be hidden

## 2019-02-17 ENCOUNTER — Inpatient Hospital Stay (HOSPITAL_COMMUNITY): Payer: Medicare Other

## 2019-02-17 ENCOUNTER — Inpatient Hospital Stay (HOSPITAL_COMMUNITY)
Admission: EM | Admit: 2019-02-17 | Discharge: 2019-02-17 | Disposition: A | Discharge status: Home / Self Care | Payer: Medicare Other | Source: Home / Self Care | Attending: Internal Medicine | Admitting: Internal Medicine

## 2019-02-17 ENCOUNTER — Encounter (HOSPITAL_COMMUNITY): Payer: Self-pay | Admitting: Radiology

## 2019-02-17 DIAGNOSIS — J189 Pneumonia, unspecified organism: Secondary | ICD-10-CM | POA: Diagnosis not present

## 2019-02-17 DIAGNOSIS — I351 Nonrheumatic aortic (valve) insufficiency: Secondary | ICD-10-CM

## 2019-02-17 DIAGNOSIS — R4182 Altered mental status, unspecified: Secondary | ICD-10-CM | POA: Diagnosis not present

## 2019-02-17 DIAGNOSIS — G9340 Encephalopathy, unspecified: Secondary | ICD-10-CM

## 2019-02-17 DIAGNOSIS — I340 Nonrheumatic mitral (valve) insufficiency: Secondary | ICD-10-CM | POA: Diagnosis not present

## 2019-02-17 DIAGNOSIS — J90 Pleural effusion, not elsewhere classified: Secondary | ICD-10-CM | POA: Diagnosis not present

## 2019-02-17 LAB — GLUCOSE, CAPILLARY
Glucose-Capillary: 175 mg/dL — ABNORMAL HIGH (ref 70–99)
Glucose-Capillary: 50 mg/dL — ABNORMAL LOW (ref 70–99)
Glucose-Capillary: 87 mg/dL (ref 70–99)
Glucose-Capillary: 107 mg/dL — ABNORMAL HIGH (ref 70–99)

## 2019-02-17 LAB — COMPREHENSIVE METABOLIC PANEL
ALT: 7 U/L (ref 0–44)
ALT: 6 U/L (ref 0–44)
AST: 22 U/L (ref 15–41)
AST: 23 U/L (ref 15–41)
Albumin: 2.3 g/dL — ABNORMAL LOW (ref 3.5–5.0)
Albumin: 2.4 g/dL — ABNORMAL LOW (ref 3.5–5.0)
Alkaline Phosphatase: 92 U/L (ref 38–126)
Alkaline Phosphatase: 89 U/L (ref 38–126)
Anion gap: 12 (ref 5–15)
Anion gap: 11 (ref 5–15)
BUN: 32 mg/dL — ABNORMAL HIGH (ref 8–23)
BUN: 30 mg/dL — ABNORMAL HIGH (ref 8–23)
CO2: 20 mmol/L — ABNORMAL LOW (ref 22–32)
CO2: 17 mmol/L — ABNORMAL LOW (ref 22–32)
Calcium: 8.4 mg/dL — ABNORMAL LOW (ref 8.9–10.3)
Calcium: 8 mg/dL — ABNORMAL LOW (ref 8.9–10.3)
Chloride: 109 mmol/L (ref 98–111)
Chloride: 110 mmol/L (ref 98–111)
Creatinine, Ser: 1.2 mg/dL — ABNORMAL HIGH (ref 0.44–1.00)
Creatinine, Ser: 1.16 mg/dL — ABNORMAL HIGH (ref 0.44–1.00)
GFR calc Af Amer: 47 mL/min — ABNORMAL LOW (ref >60)
GFR calc Af Amer: 49 mL/min — ABNORMAL LOW (ref >60)
GFR calc non Af Amer: 41 mL/min — ABNORMAL LOW (ref >60)
GFR calc non Af Amer: 43 mL/min — ABNORMAL LOW (ref >60)
Glucose, Bld: 73 mg/dL (ref 70–99)
Glucose, Bld: 148 mg/dL — ABNORMAL HIGH (ref 70–99)
Potassium: 3.2 mmol/L — ABNORMAL LOW (ref 3.5–5.1)
Potassium: 3.3 mmol/L — ABNORMAL LOW (ref 3.5–5.1)
Sodium: 141 mmol/L (ref 135–145)
Sodium: 138 mmol/L (ref 135–145)
Total Bilirubin: 1.1 mg/dL (ref 0.3–1.2)
Total Bilirubin: 1.3 mg/dL — ABNORMAL HIGH (ref 0.3–1.2)
Total Protein: 5.8 g/dL — ABNORMAL LOW (ref 6.5–8.1)
Total Protein: 6.1 g/dL — ABNORMAL LOW (ref 6.5–8.1)

## 2019-02-17 LAB — MAGNESIUM
Magnesium: 1.9 mg/dL (ref 1.7–2.4)
Magnesium: 2.1 mg/dL (ref 1.7–2.4)

## 2019-02-17 LAB — BLOOD GAS, ARTERIAL
Acid-base deficit: 4.6 mmol/L — ABNORMAL HIGH (ref 0.0–2.0)
Bicarbonate: 18.8 mmol/L — ABNORMAL LOW (ref 20.0–28.0)
Drawn by: 25788
O2 Saturation: 97.2 %
Patient temperature: 97.5
pCO2 arterial: 28.9 mmHg — ABNORMAL LOW (ref 32.0–48.0)
pH, Arterial: 7.426 (ref 7.350–7.450)
pO2, Arterial: 87.4 mmHg (ref 83.0–108.0)

## 2019-02-17 LAB — URINALYSIS, ROUTINE W REFLEX MICROSCOPIC
Bilirubin Urine: NEGATIVE
Glucose, UA: NEGATIVE mg/dL
Ketones, ur: 5 mg/dL — AB
Nitrite: NEGATIVE
Protein, ur: 30 mg/dL — AB
Specific Gravity, Urine: 1.017 (ref 1.005–1.030)
pH: 5 (ref 5.0–8.0)

## 2019-02-17 LAB — CBC WITH DIFFERENTIAL/PLATELET
Abs Immature Granulocytes: 0.07 10*3/uL (ref 0.00–0.07)
Abs Immature Granulocytes: 0.08 10*3/uL — ABNORMAL HIGH (ref 0.00–0.07)
Basophils Absolute: 0 10*3/uL (ref 0.0–0.1)
Basophils Absolute: 0 10*3/uL (ref 0.0–0.1)
Basophils Relative: 0 %
Basophils Relative: 0 %
Eosinophils Absolute: 0 10*3/uL (ref 0.0–0.5)
Eosinophils Absolute: 0 10*3/uL (ref 0.0–0.5)
Eosinophils Relative: 0 %
Eosinophils Relative: 0 %
HCT: 28.9 % — ABNORMAL LOW (ref 36.0–46.0)
HCT: 30.8 % — ABNORMAL LOW (ref 36.0–46.0)
Hemoglobin: 9.1 g/dL — ABNORMAL LOW (ref 12.0–15.0)
Hemoglobin: 9.3 g/dL — ABNORMAL LOW (ref 12.0–15.0)
Immature Granulocytes: 1 %
Immature Granulocytes: 1 %
Lymphocytes Relative: 2 %
Lymphocytes Relative: 1 %
Lymphs Abs: 0.3 10*3/uL — ABNORMAL LOW (ref 0.7–4.0)
Lymphs Abs: 0.2 10*3/uL — ABNORMAL LOW (ref 0.7–4.0)
MCH: 32.2 pg (ref 26.0–34.0)
MCH: 32.3 pg (ref 26.0–34.0)
MCHC: 31.5 g/dL (ref 30.0–36.0)
MCHC: 30.2 g/dL (ref 30.0–36.0)
MCV: 102.1 fL — ABNORMAL HIGH (ref 80.0–100.0)
MCV: 106.9 fL — ABNORMAL HIGH (ref 80.0–100.0)
Monocytes Absolute: 0.7 10*3/uL (ref 0.1–1.0)
Monocytes Absolute: 0.6 10*3/uL (ref 0.1–1.0)
Monocytes Relative: 6 %
Monocytes Relative: 4 %
Neutro Abs: 10.4 10*3/uL — ABNORMAL HIGH (ref 1.7–7.7)
Neutro Abs: 12.2 10*3/uL — ABNORMAL HIGH (ref 1.7–7.7)
Neutrophils Relative %: 91 %
Neutrophils Relative %: 94 %
Platelets: 152 10*3/uL (ref 150–400)
Platelets: 143 10*3/uL — ABNORMAL LOW (ref 150–400)
RBC: 2.83 MIL/uL — ABNORMAL LOW (ref 3.87–5.11)
RBC: 2.88 MIL/uL — ABNORMAL LOW (ref 3.87–5.11)
RDW: 20.9 % — ABNORMAL HIGH (ref 11.5–15.5)
RDW: 21.6 % — ABNORMAL HIGH (ref 11.5–15.5)
WBC: 11.4 10*3/uL — ABNORMAL HIGH (ref 4.0–10.5)
WBC: 13 10*3/uL — ABNORMAL HIGH (ref 4.0–10.5)
nRBC: 0.2 % (ref 0.0–0.2)
nRBC: 0.2 % (ref 0.0–0.2)

## 2019-02-17 LAB — LACTIC ACID, PLASMA
Lactic Acid, Venous: 1.9 mmol/L (ref 0.5–1.9)
Lactic Acid, Venous: 2.4 mmol/L (ref 0.5–1.9)

## 2019-02-17 LAB — AMMONIA: Ammonia: 15 umol/L (ref 9–35)

## 2019-02-17 LAB — ECHOCARDIOGRAM LIMITED
Height: 63 in
Weight: 1654.33 oz

## 2019-02-17 LAB — MRSA PCR SCREENING: MRSA by PCR: NEGATIVE

## 2019-02-17 MED ORDER — LORAZEPAM 2 MG/ML IJ SOLN: 1.0000 mg | INTRAMUSCULAR | Status: DC | PRN

## 2019-02-17 MED ORDER — DEXTROSE 50 % IV SOLN
1.0000 | Freq: Once | INTRAVENOUS | Status: AC
  Administered 2019-02-17: 50 mL via INTRAVENOUS
  Filled 2019-02-17: qty 50

## 2019-02-17 MED ORDER — POTASSIUM CHLORIDE 10 MEQ/100ML IV SOLN
10.0000 meq | INTRAVENOUS | Status: AC
  Administered 2019-02-17 (×2): 10 meq via INTRAVENOUS
  Filled 2019-02-17 (×2): qty 100

## 2019-02-17 MED ORDER — ASPIRIN 300 MG RE SUPP
300.0000 mg | Freq: Every day | RECTAL | Status: DC
  Administered 2019-02-17 – 2019-02-22 (×6): 300 mg via RECTAL
  Filled 2019-02-17 (×6): qty 1

## 2019-02-17 MED ORDER — SODIUM CHLORIDE (PF) 0.9 % IJ SOLN
INTRAMUSCULAR | Status: AC
  Filled 2019-02-17: qty 50

## 2019-02-17 MED ORDER — ORAL CARE MOUTH RINSE
15.0000 mL | Freq: Two times a day (BID) | OROMUCOSAL | Status: DC
  Administered 2019-02-18 – 2019-02-22 (×8): 15 mL via OROMUCOSAL

## 2019-02-17 MED ORDER — CHLORHEXIDINE GLUCONATE CLOTH 2 % EX PADS
6.0000 | MEDICATED_PAD | Freq: Every day | CUTANEOUS | Status: DC
  Administered 2019-02-17 – 2019-02-22 (×6): 6 via TOPICAL

## 2019-02-17 MED ORDER — DEXTROSE-NACL 5-0.9 % IV SOLN
INTRAVENOUS | Status: DC
  Administered 2019-02-17 – 2019-02-18 (×2): via INTRAVENOUS

## 2019-02-17 MED ORDER — DILTIAZEM HCL 25 MG/5ML IV SOLN
10.0000 mg | Freq: Once | INTRAVENOUS | Status: AC
  Administered 2019-02-17: 10 mg via INTRAVENOUS
  Filled 2019-02-17: qty 5

## 2019-02-17 MED ORDER — IOHEXOL 350 MG/ML SOLN
100.0000 mL | Freq: Once | INTRAVENOUS | Status: AC | PRN
  Administered 2019-02-17: 75 mL via INTRAVENOUS

## 2019-02-17 MED ORDER — POTASSIUM CHLORIDE 10 MEQ/100ML IV SOLN
10.0000 meq | INTRAVENOUS | Status: DC
  Administered 2019-02-17 (×2): 10 meq via INTRAVENOUS
  Filled 2019-02-17: qty 100

## 2019-02-17 MED ORDER — DILTIAZEM HCL-DEXTROSE 125-5 MG/125ML-% IV SOLN (PREMIX)
5.0000 mg/h | INTRAVENOUS | Status: DC
  Administered 2019-02-17: 5 mg/h via INTRAVENOUS
  Filled 2019-02-17: qty 125

## 2019-02-17 MED ORDER — SODIUM CHLORIDE 0.9 % IV BOLUS
500.0000 mL | Freq: Once | INTRAVENOUS | Status: AC
  Administered 2019-02-17: 500 mL via INTRAVENOUS

## 2019-02-17 MED ORDER — POTASSIUM CHLORIDE 10 MEQ/100ML IV SOLN
10.0000 meq | INTRAVENOUS | Status: DC
  Administered 2019-02-17: 10 meq via INTRAVENOUS
  Filled 2019-02-17 (×2): qty 100

## 2019-02-17 MED ORDER — STROKE: EARLY STAGES OF RECOVERY BOOK
Freq: Once | Status: AC
  Administered 2019-02-19: 22:00:00
  Filled 2019-02-17 (×2): qty 1

## 2019-02-17 MED ORDER — CHLORHEXIDINE GLUCONATE 0.12 % MT SOLN
15.0000 mL | Freq: Two times a day (BID) | OROMUCOSAL | Status: DC
  Administered 2019-02-18 – 2019-02-22 (×9): 15 mL via OROMUCOSAL
  Filled 2019-02-17 (×9): qty 15

## 2019-02-17 MED ORDER — SODIUM CHLORIDE 0.9 % IV SOLN
INTRAVENOUS | Status: DC | PRN
  Administered 2019-02-17: 250 mL via INTRAVENOUS

## 2019-02-17 NOTE — Progress Notes (Signed)
Attempted echo. Room was full of people trying to do various STAT orders. RN said to come back later.

## 2019-02-17 NOTE — Progress Notes (Signed)
RN received called for CCMD to notify that patient's HR/Rhythm is Afib with RVR in the 160s sustaining. Notified Dr. Posey Pronto regarding these findings. Received orders for Cardizem bolus and transfer to SDU. Shortly after patient began to cease responding to verbal stimuli, attempted sternal rub, and noxious stimuli with very minimal response, patient would open right eye, but made no sounds. Minimally moaning with rectal temperature check. CBG check was 50. Verbal order received for 1 amp of D50. Charge nurse, Janett Billow D,RN notified Rapid response RN Christian of changes in patient. [name], was at bedside for this event.

## 2019-02-17 NOTE — Progress Notes (Signed)
RN performed a Bladder Scan which yielded 375 ml.Patient has not voided since 1900. PCP was notified.

## 2019-02-17 NOTE — Progress Notes (Signed)
Urinalysis and Urine Culture were sent to the Lab.

## 2019-02-17 NOTE — Progress Notes (Addendum)
Patient ID: Sydney [name], female   DOB: Mar 13, 1931, 85 y.o.   MRN: [id number]  ETHICS CONSULT NOTE:  I was contacted by Dr. Posey Pronto, patient's attending physician as of today 02/17/19.  Patient is already known to the ethics committee and Dr. Madison Hickman has been in contact with the medical team already and with the patient's daughter (see note from 02/07/19).    Per Dr. Serita Grit report, there is some concern given the patient's poor prognosis, likelihood of need for intensive medical intervention (possibly very soon), and family/surrogate decision maker desire that "everything be done" which is in conflict with the opinions/feelings of some of the medical staff, including attending physician who is in favor of comfort/palliative care, or at the very least a do-not-resuscitate order.   The daughter is the patient's legal surrogate decision-maker.  She has been making all decisions up to this point, patient is unable to participate in a meaningful discussion at this time, per my understanding.   There are a few things to consider in this case:  1.  Is this a case of medical futility/terminal illness, in which case [facility] Futility Policy procedures might be considered. This would essentially mean that the attending physician, if they feel it is appropriate, may go against the wishes of the patient/family for aggressive treatments including CPR/intubation if that physician dose not feel intensive interventions/resuscitative measures would be helpful or that such measures would cause more undue suffering. (see policy here: https://[place].http://www.thompson.biz/.aspx?sourcedoc=%7B4678AB26-[email or web address]-[email or web address]%7D&file=Medical%20Futility%20Policy%20(DRAFT%201)%20(2).doc&action=view&DefaultItemOpen=1)   2.  What evidence is there, if any, for what the patient would have wanted in this situation?  It sounds like the daughter has made reference to a conversation where at  some point her mother said that she "would want everything done to be kept alive."  It may be worth discussing with the daughter what sort of quality of life her mother was aiming for, what her goals might have been.  If there is true little chance of meaningful recovery of good quality of life as the patient might have defined this, then perhaps we can focus on the quality of life her mother would have wanted rather than just life itself.  It may also be helpful to involve other family members in the conversation, see if the patient had any religious leaders etc. that she may have spoken to about this issue who might also be able to offer some guidance.  3.  It also sounds like the daughter is making some confusing decisions with regard to certain medical interventions, and may not have a good understanding of risks versus benefits.  If the daughter is insistent on full code and curative treatment and other aggressive measures, it does not make sense for her to pick and choose which treatments are being implemented, see below. The medical team needs to be really clear in terms of risk versus benefit and also try to get a clear idea of what the daughter's goals are for her mom.  Few examples: It sounds like there is clear understanding that patient's nutrition status is a problem, but the daughter has been refusing tube feeding due to concern for aspiration.  It sounds like there is probably need for thoracentesis to drain pleural effusion, but the daughter has been reluctant to pursue this procedure given that the patient would need to be sedated for it.  It sounds like sacral ulcer dressing changes because this patient a great deal of pain and the attending physician was of the opinion that changing dressings every  other day would be sufficient, but the daughter is requesting dressing changes 3 times per day. Basically, "live-saving" procedures are not being done, and patient is also likely experiencing  unnecessary pain. I think reevaluating what goals are in terms of pain, quality of life, chance for meaningful recovery, what these things might mean to the patient f she could communicate with [place]...          ADDENDUM from conversation with Dr. Posey Pronto 02/19/19   Sounds like patient has taken a turn for the worse, recent stroke. Daughter is pursuing transfer to tertiary academic facility for care. No local stepdown beds are available. There is still some very real concern for what to do if this patient "codes." Dr. Posey Pronto is not entirely comfortable with initiating the Futility Policy as noted above.   I advised that, if there really is evidence that the patient "would want everything done," then certainly keeping her a full code sounds like the only option 1) if the physicians in charge are not planning to treat this patient as a medically futile case and 2) if the family wants full code.    It needs to be made VERY clear to the daughter that there is serious risk associated with CPR/intubation, including significant pain/suffering and unlikelihood of successful resuscitation to meaningful quality of life, even if resuscitation is successful in restoring vital functions. Again, would the patient want to experience this suffering if her life was not going to be ultimately improved? Would it be worth it to her? Does it align with her values for herself? Even if we would not make the same decisions fr ourselves in a similar situation, we must respect the patient's wishes unless we are going to definitely declare that her case is medically futile and therefore offer no more attempts at cure and no more treatments that will only prolong her dying.    I'm happy to set up a meeting. I think that approaching the daughter with the idea that the Ethics Committee consult is here to help facilitate a discussion between patient/family/medical team to make sure we are all hearing each other, understanding each  other, and doing what's best for the patient. We are NOT here to "make decisions" for anyone, but we are here as a resource to ensure the best possible decisions are being made on behalf of the patient.         . . . . .     I have asked Dr. Posey Pronto to update me on the patient's situation as needed.  I am happy to set up a meeting with the healthcare team/patient/family and in the interest of continuity I may be able to involve Dr. Andria Frames as well, if he is available.  If there are any further questions, Dr. Posey Pronto has my cell phone number, or the ethics pager can be reached at [place], [place]

## 2019-02-17 NOTE — Consult Note (Signed)
[facility] TeleNeurology Consult Services   Date of Service:   02/17/2019 11:08:25  Impression:     .  Encephalopathy  Comments/Sign-Out: Patient appears encephalopathic. She is not trying to answer questions and is inattentive. She was hypoglycemic and hypotensive when found. Possibly a seizure but no clear focal deficits concerning for stroke. Recommend seizure work up.  Mechanism of Stroke: Not Clear  Metrics: Last Known Well: 02/17/2019 09:00:00 [facility] Notification Time: 02/17/2019 11:08:25 Stamp Time: 02/17/2019 11:08:25 Time First Login Attempt: 02/17/2019 11:14:53 Video Start Time: 02/17/2019 11:14:53  Symptoms: altered mental status NIHSS Start Assessment Time: 02/17/2019 11:23:20 Patient is not a candidate for Alteplase/Activase. Patient was not deemed candidate for Alteplase/Activase thrombolytics because of No focal deficits consistent with stroke, appears more encephalopathic. Weight Noted by Staff: 46.9 kg Video End Time: 02/17/2019 11:32:52  CT head showed no acute hemorrhage or acute core infarct.  Clinical Presentation is not Suggestive of Large Vessel Occlusive Disease  Our recommendations are outlined below.  Recommendations:     .  Activate Stroke Protocol Admission/Order Set     .  Stroke/Telemetry Floor     .  Neuro Checks     .  Bedside Swallow Eval     .  DVT Prophylaxis     .  IV Fluids, Normal Saline     .  Head of Bed 30 Degrees     .  Euglycemia and Avoid Hyperthermia (PRN Acetaminophen)     .  Initiate Aspirin 81 MG Daily     .  Initiate Plavix 75 MG Daily  Routine Consultation with [facility] for Follow up Care  Sign Out:     .  Discussed with Emergency Department Provider    ------------------------------------------------------------------------------  History of Present Illness: Patient is a 85 year old Female.  Inpatient stroke alert was called for symptoms of altered mental status  85  yo F with history of TIA, and htn admitted on 10/21 with pleural effusion and hypoxia, last normal at 9:00 then when her nurse came in she was unresponsive. She could not open her left eye and left arm tremor. She was not responding to rapid response nurse. Glucose was 50 initially, then 250 after received D50. Patient is not able to answer questions. BP 98/63   Past Medical History:     . Hypertension  Anticoagulant use:  No  Antiplatelet use: aspirin 81mg  and plavix 75mg   Examination: BP(98/63), Pulse(102), Blood Glucose(50 then 250) 1A: Level of Consciousness - Arouses to minor stimulation + 1 1B: Ask Month and Age - Could Not Answer Either Question Correctly + 2 1C: Blink Eyes & Squeeze Hands - Performs 0 Tasks + 2 2: Test Horizontal Extraocular Movements - Normal + 0 3: Test Visual Fields - No Visual Loss + 0 4: Test Facial Palsy (Use Grimace if Obtunded) - Normal symmetry + 0 5A: Test Left Arm Motor Drift - Some Effort Against Gravity + 2 5B: Test Right Arm Motor Drift - Some Effort Against Gravity + 2 6A: Test Left Leg Motor Drift - No Effort Against Gravity + 3 6B: Test Right Leg Motor Drift - No Effort Against Gravity + 3 7: Test Limb Ataxia (FNF/Heel-Shin) - No Ataxia + 0 8: Test Sensation - Normal; No sensory loss + 0 9: Test Language/Aphasia - Mute/Global Aphasia: No Usable Speech/Auditory Comprehension + 3 10: Test Dysarthria - Mute/Anarthric + 2 11: Test Extinction/Inattention - No abnormality + 0  NIHSS Score: 20  Patient/Family was informed the  Neurology Consult would happen via TeleHealth consult by way of interactive audio and video telecommunications and consented to receiving care in this manner.   Due to the immediate potential for life-threatening deterioration due to underlying acute neurologic illness, I spent 30 minutes providing critical care. This time includes time for face to face visit via telemedicine, review of medical records, imaging studies and  discussion of findings with providers, the patient and/or family.   Dr Carolin Sicks   [facility] [phone]   Case [id number]

## 2019-02-17 NOTE — Care Management Important Message (Signed)
Important Message  Patient Details IM Letter given to Dessa Phi RN to present to the Patient Name: Sydney [name] MRN: [id number] Date of Birth: 11/06/1930   Medicare Important Message Given:  Yes     Kerin Salen 02/17/2019, 11:11 AM

## 2019-02-17 NOTE — Progress Notes (Signed)
Patient transported to ICU 1225 via bed for continued medical care. Patient belongings sent with daughter, in the waiting room.

## 2019-02-17 NOTE — Progress Notes (Signed)
PT Note  Patient Details Name: Sydney [name] MRN: [id number] DOB: 20-May-1930       Noted order in chart for PT. Chart reviewed and familiar with this patient from last visit. Went to see patient , however daughter speaking to MD in hallway. With tests and events that have occurred today will hold on PT eval today .   Clide Dales 02/17/2019, 5:32 PM

## 2019-02-17 NOTE — Progress Notes (Signed)
[facility] Progress Note  Patient: Sydney [name] [id number]   PCP: [name], MD DOB: [birth year]   DOA: 02/14/2019   DOS: 02/17/2019   Date of Service: the patient was seen and examined on 02/17/2019  Chief Complaint  Patient presents with   Low oxygen saturation   Brief hospital course: Patient presented with complaints of hypoxia on 02/10/2019.  Chest x-ray was performed on 02/04/2019 which showed large bilateral pleural effusion left more than right, Evidence of worsening bilateral pleural effusion as well as possible infiltrate. CT scan of the chest without contrast was performed which showed complete atelectasis of the left lower lobe and large amount of abnormal density in the left mainstem bronchus and lower lobe bronchi concerning for aspirated material or mucous plugging. Patient was requiring 3-4 L of oxygen to maintain 100% saturation on admission.  Initial plan was to perform thoracentesis and treat aspiration pneumonia.  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 On 02/16/2019 patient was unable to tolerate thoracentesis safely and therefore the procedure was aborted.  Order for flutter valve and chest vest for patient's mucous plugging although chest vest was refused by family and patient was not successfully able to perform flutter valve.  Given patient's stability of oxygenation as well as multiple comorbidities decision was made that the patient is not a bronchoscopy candidate.  On 02/17/2019 patient went into A. fib with RVR, patient was given IV Cardizem bolus with rate control followed by started on Cardizem infusion. Around 9 AM patient was last seen normal.  Around 10:19 AM I was called at bedside to evaluate the patient as she was not responding to painful stimuli.  At the time of my evaluation patient was lethargic unable to follow any commands.  Code stroke  was called.  CT head was performed.  Patient was evaluated by teleneurology.  Patient was hypoglycemic with blood sugar in 50s and was given an amp of D50 with eventual improvement in her blood sugar at 175 without any improvement in patient's mentation.  After evaluation by teleneurology it was felt that the patient may have suffered from seizure and is currently postictal and since there was no focal finding on exam it was felt that the patient is suffering from metabolic encephalopathy. Stat EEG was performed which was not showing any acute seizures and no seizure focus as well and showed metabolic encephalopathy. Metabolic work-up was unremarkable other than hyperglycemia. ABG showed no hypercarbia and no significant hypoxia or acidosis or alkalosis.  Stat MRI brain was performed which was positive for acute stroke in the left MCA territory. Discussed with Dr. Cheral Marker in neurology, based on the MRI, it appears that the patient may have completed her infarct and therefore recommended transfer to [facility] and further stroke work-up as well as aspirin.  Not a candidate for aggressive intervention per neurology.  Given the size of the stroke anticoagulation was not recommended for A. fib. PT OT and speech therapy were already consulted on this patient. Echocardiogram was performed which shows 40 to 45% EF without any significant valvular abnormality. Patient remains n.p.o. For hypoglycemia patient was given IV D5 normal saline. A. fib with RVR was treated with IV Cardizem but patient converted to normal sinus rhythm therefore Cardizem infusion was on hold. CCM evaluated the patient as well at bedside.  At this point family requested patient to be transferred to [facility] as first priority and [facility] as second priority before considering transfer to [place] currently does not have capacity for stepdown level patient care and therefore recommended to  call back tomorrow. [facility] is currently evaluating the patient for transfer.  Currently further plan is continue further stroke work-up and supportive care.  Continue to engage with the family regarding goals of care.  Subjective: Early in the morning and my evaluation patient was repeating this worse "leave me alone".  I asked her if she was in pain she gave me incoherent answer.  I asked her if she wants pain medication, she said no. I asked her if her breathing was heavier and she gave me incoherent answer again. Overnight patient had some retention of the urine requiring in and out catheterization x1. As mentioned above on repeat evaluation around 9 AM patient was at her baseline but had RVR. On further repeat evaluation around 10:20AM patient was nonverbal not following any commands not withdrawing to painful stimuli.  Withdrawal to painful stimuli improved while the patient was in the CT scan but nonverbal and not following any commands status remain throughout the day unchanged.  Spontaneously moving both right and left extremities. Patient moans every time when I change her position, per daughter this is due to her sacral ulcer.  Assessment and Plan: 1.  Acute left MCA infarct Code stroke was called, patient was felt suffering from metabolic encephalopathy as she was nonfocal on the initial evaluation and due to hypoglycemia seizure was high in the differential. MRI brain confirms left MCA infarct. Suspect this is secondary to A. fib as etiology. Unable to anticoagulate the patient at this point given size of the infarct. Patient likely appear to have completed her stroke therefore not a candidate for any aggressive intervention per neurology. CTA head and neck were initially not performed due to patient's poor creatinine clearance of 25 and acute kidney injury on chronic kidney disease as well as risk for further worsening of her renal function. After discussion with the family as well as  neurologist CTA head and neck was ordered after their agreement. Aspirin 300 suppository given. N.p.o. PT OT and speech evaluation. Patient to be transferred to [facility] for stroke work-up. Family currently wants to pursue aggressive care but would like to have thorough discussion of risk and benefit before any decisions are made.  2.  Acute hypoxic respiratory failure Bilateral pleural effusion left greater than right. Left lower lobe atelectasis Mucous plugging versus aspiration of food debris's on left main bronchus.  Presented with hypoxia identified at wound care center when patient presented with follow-up. Per daughter there were no acute events or complaints after her discharge from the hospital. Per daughter patient was eating well at home. CT scan shows evidence of worsening bilateral pleural effusion thought to be secondary to diastolic dysfunction as well as reduced oncotic pressure. Admitting provider discussed with infectious disease Dr. Johnnye Sima and started the patient on IV Flagyl for possible aspiration coverage. 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 If the patient's condition worsens further in the setting of sepsis recommendation is to start the patient on oral vancomycin 125 mg daily while the patient remains on broad-spectrum antibiotics. IR was consulted for thoracentesis although patient was white initially agreeable on the table was refusing to go through the procedure and was screaming and begging not to perform the procedure, moving a lot around therefore the procedure was aborted.  Decision was made to repeat attempt next day with IV sedation if the patient's daughter agrees for it. Given current acute stroke as well as A. fib with RVR currently holding off on further procedure unless absolutely needed. Patient's oxygenation remained stable on 3 L at 100%. Monitor.  For mucous  plugging/food debris's patient was deemed not a candidate for bronchoscopic evaluation given her multiple comorbidities.  Chest vest was ordered but daughter felt that it would be too strong for the patient therefore was discontinued. Flutter valve was ordered but the patient is unable to perform consistently and is unlikely to perform consistently given her acute stroke right now with poor responsiveness. Can attempt to striker bed while the patient is in the progressive care unit. Patient remains n.p.o. for now.  3.  A. fib with RVR CHA2DS2-VASc Score 4/5 No prior history of A. fib identified in the chart. Likely in the setting of her respiratory illness. Currently back in normal sinus rhythm. Initially given 10 mg dose of IV Cardizem bolus followed by 5 mg infusion with titration upwards. Converted to normal sinus rhythm spontaneously. Continue to maintain in the stepdown unit.  Use as needed Lopressor in case of RVR although avoid hypotension in patient with stroke. Unlikely that the acute A. fib has caused her acute CVA.  Although the patient has not been able to take her aspirin or Plavix due to her refusal to take this medicine as well as refusal to use Lovenox. May need amiodarone infusion if remains persistently tachycardic. Now that the echocardiogram is showing low EF Cardizem is not an option.  4.  Chronic diastolic CHF with acute systolic worsening. Patient did not have any chest pain early in the morning when I evaluated her when she was in A. fib with RVR as well as prior to that. EKG shows T wave inversions in the lateral leads but otherwise no significant ischemic findings. At present continue to treat conservatively. Patient does have evidence of third spacing given her large pleural effusion but due to her poor p.o. intake patient is currently on IV fluids therefore monitor for volume overload. Continue to monitor for now. Not a candidate for anticoagulation given her acute  stroke, not a candidate for intervention given her multiple comorbidities.  5.  Acute kidney injury Based on renal function of serum creatinine 0.7-0.8. Presents with a serum creatinine of 1.2-1.24.  Currently 1.16. Evidence of dehydration clinically. Patient is currently receiving IV fluids although at risk for volume overload and therefore monitor closely. Patient is also at risk for worsening of renal function given the need for CT scan on contrast but currently family agrees for the procedure and neurology has requested the procedure.  6.  Hypokalemia. Replaced aggressively.  7.  Metabolic acidosis. Likely in the setting of acute kidney injury. Currently receiving IV hydration monitor. ABG shows no evidence of respiratory acidosis.  8.  Failure to thrive, poor p.o. intake, severe protein calorie malnutrition Patient has progressive decline over last few months. Multiple hospitalization, 8/21, 8/24-10/6, 10/8, and now 10/21. Extensive evaluation performed by multiple specialists. Patient has poor p.o. intake.  GI evaluation was performed but no clear etiology was identified.  Core track was attempted in the past.  GI felt that the patient is not a candidate for PEG tube placement.  IR felt that performing PEG tube in the presence of ascites is high risk. After that initial discussion patient was felt not a candidate for any feeding tube placement and the family  has also decided not to pursue that. Patient is at risk for aspiration given her dementia and deconditioning and multiple speech evaluation has been performed. Patient was allowed dysphagia 3 diet with thin liquids. Presents this admission with a possible aspiration event. Severely dehydrated at the time of my evaluation with cachexia. Patient has sacral ulcer which is nonhealing despite aggressive intervention including hydrotherapy. Patient is refusing Ensure, prostat, Juven during this hospital stay. Now that the patient is  suffering from an acute left MCA moderate-sized stroke her p.o. intake is going to be worse than before. Sacral ulcer wound care is causing significant pain to the patient but patient is also refusing pain medication as well as family prefers not to use them. Given the lack of adequate nutrition I suspect her condition will worsen with or without the presence of the stroke down the road. Patient is suffering from acute kidney injury from poor p.o. intake as well as hypoglycemia. Currently receiving IV D5 normal saline. Multiple evaluation from ethics committee as well as palliative care has been performed in the past as well. Family wants to pursue aggressive care at present. Continue to engage with family regarding goals of care.  9.  Goals of care discussion. I spent extensive amount of time discussing with the family and answering all the questions and concerns as well as explaining regarding various options of care that the patient should and should not get. Patient has acute stroke leaving her with significant deficit. Patient has worsening bilateral pleural effusion but unable to tolerate thoracentesis or diuretics and at risk for damage from either procedures. Patient also has acute kidney injury from p.o. intake and her p.o. intake is not improving despite aggressive care. Given her aspiration risk do not think that the patient will benefit from any type of feeding tube placement. Patient has acute urinary retention and is now requiring Foley catheter. During the stroke work-up echocardiogram now shows EF of 40% with diffuse wall motion abnormality without any valvular abnormality. Patient also has evidence of aspiration based on the CT scan with left mainstem bronchus filled with mucous plug versus food debris. Patient has nonhealing sacral ulcer present since August progressively worsening and without any adequate nutrition this wound is unlikely to heal and will lead to further  deconditioning. Her acute stroke is going to make the wound worse as well due to lack of mobility and poor p.o. intake. Patient is suffering from pain from the sacral ulcer as well as having difficulty breathing from bilateral pleural effusion. With all these comorbidities patient's prognosis is significantly guarded and in the setting of acute stroke as well as A. fib with RVR and now with new low EF prognosis is highly limited. I am concerned that the patient may have life-threatening event within the next week. PCCM was consulted and I agree with their recommendation that in the event of cardiac arrest patient's chances of survival is highly unlikely and in the event of survival meaningful recovery is near impossible given her acute stroke.  Patient should be DNR but family currently wants to pursue aggressive care and full code.  Explained to the family that DNR does not mean do not treat and we can still pursue other medical interventions within the limits of DNR. Explained to the family that given multiple organ failure patient is an active but slow process of dying and would benefit from transitioning to comfort care/hospice. Palliative care was consulted although in the past daughter who is the POA has  clearly refused involvement of palliative care or any discussion in that regard. Ethics committee was also consulted for further assistance.  At this point given patient's multiple comorbidities I advise that the patient should not undergo CPR or intubation as there is no medical benefit of this procedures to the patient.  I also advised transition to comfort care as the patient is clearly suffering from pain from sacral ulcer which is not being treated right now.  10.  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 Currently no diarrhea. Monitor. Avoid PPI avoid other medication that can cause C. difficile or increase the risk. Currently on IV Flagyl for aspiration  pneumonia. If need to broaden the coverage per ID recommend to add oral vancomycin.  11.  Left buttock decubitus ulcer. POA. Continue wound care per wound care recommendation. Family wants to pursue more frequent dressing changes, reconsulted wound care.  12.  History of glaucoma. Continue eyedrops per  13.  Severe protein calorie malnutrition. Decreased appetite but Patient is on Remeron and Megace which the patient is refusing during his hospital stay and is now unable to take due to her acute stroke. Not a candidate for PEG tube Currently at high risk for aspiration. Continue to pursue goals of care discussion.  Pressure Injury 02/21/2019 Buttocks Left Unstageable - Full thickness tissue loss in which the base of the ulcer is covered by slough (yellow, tan, gray, green or brown) and/or eschar (tan, brown or black) in the wound bed. (Active)  02/04/2019 2300  Location: Buttocks  Location Orientation: Left  Staging: Unstageable - Full thickness tissue loss in which the base of the ulcer is covered by slough (yellow, tan, gray, green or brown) and/or eschar (tan, brown or black) in the wound bed.  Wound Description (Comments):   Present on Admission: Yes   Diet: NPO pending stroke eval DVT Prophylaxis: Subcutaneous Lovenox currently refusing  Advance goals of care discussion: Full code  Family Communication: family was present at bedside, at the time of interview. The pt provided permission to discuss medical plan with the family. Opportunity was given to ask question and all questions were answered satisfactorily.   Disposition:  Discharge to be determined.  Consultants: Palliative care PCCM, neurology, telemetry neurology, phone consultation with ID, ethics committee, consultation with [facility] neurology Procedures: Echocardiogram, EEG  Scheduled Meds:   stroke: mapping our early stages of recovery book   Does not apply Once   acetaminophen  1,000 mg Oral TID   aspirin  300 mg  Rectal Daily   chlorhexidine  15 mL Mouth Rinse BID   Chlorhexidine Gluconate Cloth  6 each Topical Daily   clopidogrel  75 mg Oral Daily   enoxaparin (LOVENOX) injection  30 mg Subcutaneous Q24H   feeding supplement (ENSURE ENLIVE)  237 mL Oral BID BM   feeding supplement (PRO-STAT SUGAR FREE 64)  30 mL Oral BID WC   folic acid  1 mg Oral Daily   latanoprost  1 drop Both Eyes QHS   mouth rinse  15 mL Mouth Rinse q12n4p   megestrol  400 mg Oral BID   mirtazapine  45 mg Oral QHS   multivitamin with minerals  1 tablet Oral Daily   nutrition supplement (JUVEN)  1 packet Oral BID BM   Continuous Infusions:  sodium chloride Stopped (02/17/19 1441)   dextrose 5 % and 0.9% NaCl Stopped (02/17/19 1722)   diltiazem (CARDIZEM) infusion Stopped (02/17/19 1441)   metronidazole 500 mg (02/17/19  1836)   potassium chloride 10 mEq (02/17/19 1827)   PRN Meds: sodium chloride, acetaminophen, LORazepam, traMADol Antibiotics: Anti-infectives (From admission, onward)   Start     Dose/Rate Route Frequency Ordered Stop   02/16/19 1500  cephALEXin (KEFLEX) capsule 500 mg     500 mg Oral Every 12 hours 02/16/19 1358 02/16/19 2220   02/04/2019 1800  metroNIDAZOLE (FLAGYL) IVPB 500 mg     500 mg 100 mL/hr over 60 Minutes Intravenous Every 8 hours 02/02/2019 1723     02/20/2019 1715  vancomycin (VANCOCIN) 50 mg/mL oral solution 125 mg  Status:  Discontinued     125 mg Oral Daily 02/14/2019 1710 02/08/2019 1728     Objective: Physical Exam: Vitals:   02/17/19 1500 02/17/19 1600 02/17/19 1800 02/17/19 1900  BP: 113/84 (!) 127/48 (!) 102/44 112/90  Pulse:   68 87  Resp:  18 16 18   Temp:  (!) 97.5 F (36.4 C)    TempSrc:  Axillary    SpO2:  100% 100% 100%  Weight:      Height:        Intake/Output Summary (Last 24 hours) at 02/17/2019 1904 Last data filed at 02/17/2019 1800 Gross per 24 hour  Intake 248.48 ml  Output 550 ml  Net -301.52 ml   Filed Weights   02/14/2019 1052  Weight:  46.9 kg   General: lethargic and not oriented to time, place, and person. Appear in moderate distress, affect unresponsive Eyes: Right pupil reactive, left eye ptosis, Conjunctiva normal ENT: Oral Mucosa Clear, dry  Neck: difficult to assess  JVD, no Abnormal Mass Or lumps Cardiovascular: S1 and S2 Present, no Murmur, peripheral pulses symmetrical Respiratory: increased respiratory effort, Bilateral Air entry equal and Decreased, no signs of accessory muscle use, bilateral basal crackles, no wheezes Abdomen: Bowel Sound present, Soft and no tenderness, no hernia Skin: no rashes, left buttock ulcer Extremities: no Pedal edema, difficult to assess calf tenderness Neurologic: Motor strength reduced at right upper, left upper, right lower and left lower extremity, Sensation grossly normal to painful stimuli bilaterally, Reflex difficult to assess, Babinski response equivocal and Resting tremor noted Bilaterally Gait not checked due to patient safety concerns  Data Reviewed: I have personally reviewed and interpreted daily labs, tele strips, imagings as discussed above. I reviewed all nursing notes, pharmacy notes, vitals, pertinent old records I have discussed plan of care as described above with RN and patient/family.  CBC: Recent Labs  Lab 01/30/2019 1103 02/16/19 0505 02/17/19 0519 02/17/19 1318  WBC 14.6* 15.1* 11.4* 13.0*  NEUTROABS  --   --  10.4* 12.2*  HGB 10.5* 10.9* 9.1* 9.3*  HCT 33.3* 33.8* 28.9* 30.8*  MCV 102.8* 100.9* 102.1* 106.9*  PLT 197 171 152 [id number]*   Basic Metabolic Panel: Recent Labs  Lab 02/11/19 0746 02/16/2019 1103 02/16/19 0505 02/17/19 0519 02/17/19 1318  NA 137 134* 138 141 138  K 3.2* 3.7 4.6 3.2* 3.3*  CL 107 103 105 109 110  CO2 21* 18* 18* 20* 17*  GLUCOSE 106* 72 49* 73 148*  BUN 23 31* 33* 32* 30*  CREATININE 0.87 1.22* 1.24* 1.20* 1.16*  CALCIUM 8.0* 8.1* 8.3* 8.4* 8.0*  MG 1.9  --   --  1.9 2.1   Liver Function Tests: Recent Labs  Lab  02/16/19 0505 02/17/19 0519 02/17/19 1318  AST 31 22 23   ALT 9 7 6   ALKPHOS 102 92 89  BILITOT 1.8* 1.1 1.3*  PROT 6.3* 5.8* 6.1*  ALBUMIN 2.4* 2.3* 2.4*   No results for input(s): LIPASE, AMYLASE in the last 168 hours. Recent Labs  Lab 02/17/19 1318  AMMONIA 15   Coagulation Profile: No results for input(s): INR, PROTIME in the last 168 hours. Cardiac Enzymes: No results for input(s): CKTOTAL, CKMB, CKMBINDEX, TROPONINI in the last 168 hours. BNP (last 3 results) No results for input(s): PROBNP in the last 8760 hours. CBG: Recent Labs  Lab 02/13/19 0030 02/13/19 0543 02/13/19 1216 02/17/19 1030 02/17/19 1044  GLUCAP 106* 110* 107* 50* 175*   Studies: Mr Brain Wo Contrast  Addendum Date: 02/17/2019   ADDENDUM REPORT: 02/17/2019 16:31 ADDENDUM: Redemonstrated 11 mm left parafalcine partially calcified dural-based mass overlying the left frontal lobe, consistent with small incidental meningioma. Electronically Signed   By: Kellie Simmering DO   On: 02/17/2019 16:31   Result Date: 02/17/2019 CLINICAL DATA:  Encephalopathy. Additional history provided: 85 year old female presents with altered mental status, left arm tremor. EXAM: MRI HEAD WITHOUT CONTRAST TECHNIQUE: Multiplanar, multiecho pulse sequences of the brain and surrounding structures were obtained without intravenous contrast. COMPARISON:  Noncontrast head CT 04/19/2019, brain MRI 12/21/2018 FINDINGS: Brain: Multiple sequences are motion degraded, limiting evaluation. There is a moderate-size region of cortical/subcortical restricted diffusion centered within the left frontal operculum also involving portions of the left insula and left temporal lobe. Findings are consistent with acute left MCA vascular territory infarct. No evidence of intracranial mass. No midline shift or extra-axial fluid collection. No chronic intracranial blood products. Mild scattered and confluent T2/FLAIR hyperintensity within the cerebral white  matter is nonspecific, but consistent with chronic small vessel ischemic disease. Moderate generalized parenchymal atrophy. Vascular: Flow voids maintained within the proximal large arterial vessels. Skull and upper cervical spine: No focal marrow lesion. Sinuses/Orbits: Visualized orbits demonstrate no acute abnormality. Small left sphenoid sinus air-fluid level. Bilateral mastoid effusions (greater on the right). These results were called by telephone at the time of interpretation on 02/17/2019 at 3:59 pm to provider [facility] Bryona Foxworthy , who verbally acknowledged these results. IMPRESSION: 1. Motion degraded examination 2. Findings consistent with acute left MCA vascular territory infarct. 3. Generalized parenchymal atrophy and chronic small vessel ischemic disease. 4. Bilateral mastoid effusions. Electronically Signed: By: Kellie Simmering DO On: 02/17/2019 16:05   Dg Chest Port 1 View  Result Date: 02/17/2019 CLINICAL DATA:  Hypoglycemia.  Hypotension.  Shortness of breath. EXAM: PORTABLE CHEST 1 VIEW COMPARISON:  Chest x-ray 02/14/2019. FINDINGS: Mediastinum hilar structures normal. Heart size normal. Bilateral pleural effusions, left side greater than right. Underlying pulmonary infiltrates particularly on the left cannot be excluded. Cavitation left lung base cannot be excluded. Bibasilar atelectasis. No pneumothorax. Degenerative change and scoliosis thoracic spine. IMPRESSION: 1. Bilateral pleural effusions, left side greater than right. Underlying pulmonary infiltrates particularly on the left cannot be excluded. Cavitation in the left lung base cannot be excluded. 2.  Bibasilar atelectasis. Electronically Signed   By: Marcello Moores  Register   On: 02/17/2019 13:49   Ct Head Code Stroke Wo Contrast`  Result Date: 02/17/2019 CLINICAL DATA:  Code stroke. Altered level of consciousness. Unresponsive with left-sided droop and facial asymmetry EXAM: CT HEAD WITHOUT CONTRAST TECHNIQUE: Contiguous axial images were  obtained from the base of the skull through the vertex without intravenous contrast. COMPARISON:  Brain MRI 12/21/2018 FINDINGS: Brain: No evidence of acute infarction, hemorrhage, hydrocephalus, extra-axial collection or mass effect. Generalized atrophy with notable progression since 2014. Chronic small vessel ischemia in the cerebral white matter. Calcified left paramedian upper falcine mass  measuring 11 mm, consistent with incidental meningioma. This mass has developed since 2014. Vascular: No hyperdense vessel.  Atherosclerotic calcification Skull: Negative Sinuses/Orbits: Right mastoid and left sphenoid fluid levels with negative nasopharynx. Other: These results were communicated to Dr Posey Pronto at 11:26 amon 10/23/2020by text page via the [facility] messaging system. ASPECTS [facility] Stroke Program Early CT Score) - Ganglionic level infarction (caudate, lentiform nuclei, internal capsule, insula, M1-M3 cortex): 7 - Supraganglionic infarction (M4-M6 cortex): 3 Total score (0-10 with 10 being normal): 10 IMPRESSION: 1. No acute finding.ASPECTS is 10. 2. Atrophy with notable progression from 2014. In the levin mm calcified falcine meningioma has also developed since that time. Electronically Signed   By: Monte Fantasia M.D.   On: 02/17/2019 11:28     Time spent: The patient is critically ill with multiple organ systems failure and requires high complexity decision making for assessment and support, frequent evaluation and titration of therapies. Critical Care Time devoted to patient care services described in this note is 35 minutes   Author: Berle Mull, MD [facility] 02/17/2019 7:04 PM  To reach On-call, see care teams to locate the attending and reach out to them via [email or web address]. If 7PM-7AM, please contact night-coverage If you still have difficulty reaching the attending provider, please page the [facility] (Director on Call) for [facility] on amion for assistance.

## 2019-02-17 NOTE — Progress Notes (Signed)
Patient transported to CT by bed

## 2019-02-17 NOTE — Consult Note (Signed)
NAME:  Sydney [name], MRN:  [id number], DOB:  07-29-1930, LOS: 2 ADMISSION DATE:  02/03/2019, CONSULTATION DATE:  02/17/19 REFERRING MD:  Dr. Posey Pronto / [facility] , CHIEF COMPLAINT:  AMS   Brief History   85 y/o F with adult failure to thrive, readmitted 10/21 from the wound care center with low oxygen saturations.    History of present illness   85 y/o F who presented to [facility] on 10/21 via EMS from a wound care center with low oxygen saturations.    The patient was admitted from 12/19/18 to 01/31/19 with adult failure to thrive.  Chart review shows she initially presented with progressive weakness, malaise, fatigue.  Hospital course notable for worsening encephalopathy & LFT's. She was evaluated by GI, ID, Oncology, Psychiatry and Neurology with no identifiable cause of failure to thrive (negative cultures, cortisol, TSH, CT ABD, CT C-Spine, CT T-spine, MRI Brain, MRI w/o of L thigh showed non-specific swelling thought related to hypoalbuminemia).  She had little appetite during that admission. EGD was deferred due to low yield for diagnosis.  There was thought that she may have had a transient drug induced hepatitis but was not proven.  She was started on Remeron and Megace in the hopes it would improve her appetite. Her daughter refused palliative care during hospitalization.  Additionally, she refused placement of short term feeding tube or PEG.  The daughter also refused SNF placement.  The hospital ethics committee was involved in the case and recommended supportive care.    She was admitted again from 02/02/19 to 02/13/19 after being home for two days with reports of poor PO intake.  The daughter again refused feeding tube placement.  She was seen by SLP and recommended a D3 diet.  She was evaluated by CCS and recommended for hydrotherapy for wound care.  She was treated for possible aspiration PNA with IV flagyl, nystatin for oral thrush, IVF for AKI.  PPI was stopped in the setting of diarrhea.  The  daughter refused SNF placement / hospice care at that time.    The patient was readmitted on 10/21 after being home for two days from the wound care center with reports of low oxygen saturations.  Initial evaluation in ER included an ABG with PO2 of 60.   CT of the chest was completed which showed large bilateral pleural effusions L>R with complete atelectasis of the left lower lobe concerning for aspirated material vs mucus plugging.  She was admitted per [facility] for hypoxic respiratory failure, AKI, severe protein calorie malnutrition, ongoing care of sacral decubitus.  Primary service ordered thoracentesis and it was attempted on 10/22 without success. She developed atrial fibrillation with RVR on 10/23 and was transferred to SDU.    PCCM consulted for evaluation.   Past Medical History  C-Difficile Colitis s/p stool transplantation HTN  Stage IV Sacral Ulcer - followed by wound care TIA Adult Failure to Thrive PVD Meningioma  HLD Glaucoma  Bilateral Inguinal hernia  Aortic Atherosclerosis  Anemia   Significant Hospital Events   10/21 Re-admit  Consults:  PCCM   Procedures:    Significant Diagnostic Tests:  Thora Attempt 10/22 >> unable to tolerate EEG 10/23 >> mild to moderate diffuse encephalopathy, non-specific to etiology. No seizures or epileptiform discharges were seen   Micro Data:  COVID 10/21 >>  MRSA PCR 10/23 >> negative  UC 10/23 >>   Antimicrobials:  Keflex 10/22 x2 doses   Interim history/subjective:  Afebrile.   Objective  Blood pressure 117/61, pulse (!) 102, temperature (!) 97.3 F (36.3 C), temperature source Rectal, resp. rate 14, height 5\' 3"  (1.6 m), weight 46.9 kg, SpO2 100 %.        Intake/Output Summary (Last 24 hours) at 02/17/2019 1331 Last data filed at 02/17/2019 0441 Gross per 24 hour  Intake 0 ml  Output 1050 ml  Net -1050 ml   Filed Weights   02/22/2019 1052  Weight: 46.9 kg    Examination: General: Cachectic woman, laying in  bed with a comfortable respiratory pattern HEENT: Oropharynx is dry.  She will not let me do an eye exam, intentionally closes her eyes tightly Neuro: Mild tremor.  She will groan, turn her head towards stimulation.  She will not talk to me.  She will push away with both upper extremities with good strength during attempted exam.  Does not track, does not follow commands CV: Regular, distant, no murmur PULM: Clear bilaterally.  I do not hear any upper airway noises or stridor.  No upper airway secretions GI: Soft, nondistended, possible mild diffuse tenderness based on grimace, positive bowel sounds Extremities: warm/dry, 1+ lower extremity edema Skin: no rashes or lesions  Resolved Hospital Problem list      Assessment & Plan:   Acute Hypoxemic Respiratory Failure  Bilateral Pleural Effusions Adult Failure to Thrive Chronic aspiration with evidence for aspirated material versus poorly cleared secretions in her left mainstem on CT chest Malnutrition with associated ketosis Nonhealing stage IV ulcer Atrial fibrillation  I was able to evaluate Ms. Betker on her most recent admission and today.  She has had some progressive decline since I saw her on 02/03/2019.  Today she is poorly responsive, only grimaces for me on exam and with any interaction.  I believe we are seeing progression of weakness, poor nutrition, overall failure to thrive.  She has a nonhealing wound, poor swallowing coordination and strength.  It was felt that she would not want PEG tube and this has been declined.  Again, I do not expect her to stabilize, improve, be able to protect her airway, or clear secretions adequately given the current scenario.  I believe she is progressing just as I would have predicted with more weakness, worsening airway protection, intermittent aspiration that will ultimately result in pneumonia.  Would defer any procedures including thoracentesis (pleural effusion would quickly reaccumulate given  her nutritional status), and especially intubation, mechanical ventilation, ACLS/CPR.  She would not benefit from or survive mechanical ventilation.  It would not change her quality of life, would minimally impact her length of life.  Likewise she would not survive CPR.  I would consider both mechanical ventilation, CPR to be futile care in this case.  I therefore propose that Ms. Vicencio be spared these hurtful interventions, advocate DNR status on her behalf.  I would support simple medical interventions, IV fluids, antibiotics if feasible.  Ultimately I believe she will decline further and expire from these processes. A transition to comfort care would be very appropriate here.    Best practice:  Diet: per primary  DVT prophylaxis: lovenox  GI prophylaxis: n/a  Glucose control: n/a  Mobility: bedrest  Code Status: Full Code  Family Communication: Daughter updated per Dr. Lamonte Sakai at bedside 10/23.  Disposition: Per primary   Labs   CBC: Recent Labs  Lab 02/24/2019 1103 02/16/19 0505 02/17/19 0519 02/17/19 1318  WBC 14.6* 15.1* 11.4* 13.0*  NEUTROABS  --   --  10.4* PENDING  HGB 10.5* 10.9*  9.1* 9.3*  HCT 33.3* 33.8* 28.9* 30.8*  MCV 102.8* 100.9* 102.1* 106.9*  PLT 197 171 152 143*    Basic Metabolic Panel: Recent Labs  Lab 02/11/19 0746 02/07/2019 1103 02/16/19 0505 02/17/19 0519  NA 137 134* 138 141  K 3.2* 3.7 4.6 3.2*  CL 107 103 105 109  CO2 21* 18* 18* 20*  GLUCOSE 106* 72 49* 73  BUN 23 31* 33* 32*  CREATININE 0.87 1.22* 1.24* 1.20*  CALCIUM 8.0* 8.1* 8.3* 8.4*  MG 1.9  --   --  1.9   GFR: Estimated Creatinine Clearance: 24.9 mL/min (A) (by C-G formula based on SCr of 1.2 mg/dL (H)). Recent Labs  Lab 01/31/2019 1103 02/16/19 0505 02/17/19 0519 02/17/19 1318  WBC 14.6* 15.1* 11.4* 13.0*  LATICACIDVEN  --   --  1.9  --     Liver Function Tests: Recent Labs  Lab 02/16/19 0505 02/17/19 0519  AST 31 22  ALT 9 7  ALKPHOS 102 92  BILITOT 1.8* 1.1  PROT  6.3* 5.8*  ALBUMIN 2.4* 2.3*   No results for input(s): LIPASE, AMYLASE in the last 168 hours. No results for input(s): AMMONIA in the last 168 hours.  ABG    Component Value Date/Time   PHART 7.426 02/17/2019 1051   PCO2ART 28.9 (L) 02/17/2019 1051   PO2ART 87.4 02/17/2019 1051   HCO3 18.8 (L) 02/17/2019 1051   TCO2 25 05/23/2017 1017   ACIDBASEDEF 4.6 (H) 02/17/2019 1051   O2SAT 97.2 02/17/2019 1051     Coagulation Profile: No results for input(s): INR, PROTIME in the last 168 hours.  Cardiac Enzymes: No results for input(s): CKTOTAL, CKMB, CKMBINDEX, TROPONINI in the last 168 hours.  HbA1C: Hgb A1c MFr Bld  Date/Time Value Ref Range Status  10/03/2017 04:48 AM 5.8 (H) 4.8 - 5.6 % Final    Comment:    (NOTE) Pre diabetes:          5.7%-6.4% Diabetes:              >6.4% Glycemic control for   <7.0% adults with diabetes   04/09/2015 07:50 AM 5.8 (H) 4.8 - 5.6 % Final    Comment:    (NOTE)         Pre-diabetes: 5.7 - 6.4         Diabetes: >6.4         Glycemic control for adults with diabetes: <7.0     CBG: Recent Labs  Lab 02/13/19 0030 02/13/19 0543 02/13/19 1216 02/17/19 1030 02/17/19 1044  GLUCAP 106* 110* 107* 50* 175*    Review of Systems:   Unable to complete as patient is altered.    Past Medical History  She,  has a past medical history of Anemia, Aortic atherosclerosis ([place]), Arthritis, Bilateral inguinal hernia, C. difficile diarrhea, Cataract, Dehydration (12/29/2018), Diplopia, Dizzy, Dyslipidemia, Gastritis, Glaucoma, High cholesterol, Hypertension, Leukopenia, Meningioma ([place]) (05/01/2015), Osteoporosis (02/2014), Palpitations, PVD (peripheral vascular disease) ([place]), Rhinitis, Syncopal episodes (2003), TIA (transient ischemic attack) (1990's), Trigger thumb of left hand, Weakness (12/29/2018), and Winter itch.   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 She has never used smokeless tobacco. She reports that she does not drink alcohol or use drugs.   Family History   Her family history includes Heart attack in her father; Hypertension in her father; Stroke in her brother and maternal grandmother.   Allergies Allergies  Allergen Reactions   Demerol [Meperidine] Other (See Comments)    Excessive sweating, cramping   Other     No Antibiotic without consultation with her primary physician.   Codeine Rash   Sulfa Antibiotics Itching and Other (See Comments)    Reaction unknown   Tomato Itching     Home Medications  Prior to Admission medications   Medication Sig Start Date End Date Taking? Authorizing Provider  aspirin (ECOTRIN LOW STRENGTH) 81 MG EC tablet Take 81 mg by mouth daily. Swallow whole.   Yes [provider]  cholecalciferol (VITAMIN D) 1000 units tablet Take 1,000 Units by mouth daily.   Yes [provider]  clopidogrel (PLAVIX) 75 MG tablet Take 75 mg by mouth daily.   Yes [provider]  feeding supplement, ENSURE ENLIVE, (ENSURE ENLIVE) LIQD Take 237 mLs by mouth 2 (two) times daily between meals. 02/13/19  Yes Dhungel, Nishant, MD  folic acid (FOLVITE) 1 MG tablet Take 1 mg by mouth daily.   Yes [provider]  Hydrocortisone (GERHARDT'S BUTT CREAM) CREA Apply 1 application topically 3 (three) times daily. Apply barrier cream to buttocks/sacrum TID and PRN with each incontinent cleaning session 01/31/19 03/02/19 Yes Ghimire, Henreitta Leber, MD  latanoprost (XALATAN) 0.005 % ophthalmic  solution Place 1 drop into both eyes at bedtime.    Yes [provider]  megestrol (MEGACE) 400 MG/10ML suspension Take 10 mLs (400 mg total) by mouth 3 (three) times daily. 01/31/19 03/02/19 Yes Ghimire, Henreitta Leber, MD  mirtazapine (REMERON SOL-TAB) 45 MG disintegrating tablet Take 1 tablet (45 mg total) by mouth at bedtime. 01/31/19  Yes Ghimire, Henreitta Leber, MD  Multiple Vitamin (MULTIVITAMIN WITH MINERALS) TABS tablet Take 1 tablet by mouth daily. 02/01/19  Yes Ghimire, Henreitta Leber, MD  nutrition supplement, JUVEN, (JUVEN) PACK Take 1 packet by mouth 2 (two) times daily between meals. 02/13/19  Yes Dhungel, Nishant, MD  nystatin (MYCOSTATIN/NYSTOP) powder Apply topically 2 (two) times daily. Apply to affected area on the skin folds 01/31/19  Yes Ghimire, Henreitta Leber, MD  acetaminophen (TYLENOL) 650 MG suppository Place 1 suppository (650 mg total) rectally every 6 (six) hours as needed for mild pain (or Fever >/= 101). Patient not taking: Reported on 02/14/2019 02/13/19   Dhungel, Flonnie Overman, MD  Amino Acids-Protein Hydrolys (FEEDING SUPPLEMENT, PRO-STAT SUGAR FREE 64,) LIQD Take 30 mLs by mouth 2 (two) times daily with a meal. Patient not taking: Reported on 02/02/2019 01/31/19 03/02/19  Jonetta Osgood, MD  loperamide (IMODIUM) 2 MG capsule Take 1 capsule (2 mg total) by mouth as needed for diarrhea or loose stools. Patient not taking: Reported on 02/13/2019 01/31/19   Jonetta Osgood, MD  pantoprazole (PROTONIX) 20 MG tablet Take 20 mg by mouth daily. 12/09/18   [provider]  traMADol (ULTRAM) 50 MG tablet Take 0.5 tablets (25 mg total) by mouth every 12 (twelve) hours as needed for moderate pain. 01/31/19   Ghimire, Henreitta Leber, MD     Baltazar Apo, MD, PhD 02/17/2019, 3:18 PM [facility] [phone] or if no answer [phone]

## 2019-02-17 NOTE — Progress Notes (Signed)
SLP Cancellation Note  Patient Details Name: Sydney [name] MRN: [id number] DOB: 1930-12-12   Cancelled treatment:       Reason Eval/Treat Not Completed: Fatigue/lethargy limiting ability to participate;Other (comment) Pt and daughter well known to [facility] services. Aspiration precautions and education have been discussed extensively during last admission. Pt cooperation and participation is highly variable. RN reports pt not cooperative at this time, and does not feel BSE is safe at this time. Will hold for now and recheck for appropriateness over the weekend. Recommend Palliative Care consult.   Celia B. Quentin Ore, [facility] [place] Speech Language Pathologist Office: [phone] Pager: [phone]  Shonna Chock 02/17/2019, 2:00 PM

## 2019-02-17 NOTE — Procedures (Signed)
Patient Name: Sydney [name]  MRN: [id number]  Epilepsy Attending: Lora Havens  Referring Physician/Provider: Dr. Berle Mull Date: 02/17/2019 Duration: 26.04 minutes  Patient history: 85 year old female who presented with altered mental status, left arm tremor.  EEG to evaluate for seizure.  Level of alertness: Awake/confused  AEDs during EEG study: None  Technical aspects: This EEG study was done with scalp electrodes positioned according to the 10-20 International system of electrode placement. Electrical activity was acquired at a sampling rate of 500Hz  and reviewed with a high frequency filter of 70Hz  and a low frequency filter of 1Hz . EEG data were recorded continuously and digitally stored.   Description: EEG showed continuous generalized 2 to 5 Hz theta-delta slowing.  No clear posterior dominant rhythm was seen. Hyperventilation and photic stimulation were not performed.  Of note, the study was technically difficult due to significant myogenic artifact.  Abnormality -Continuous slow, generalized  IMPRESSION: This technically difficult study is suggestive of mild to moderate diffuse encephalopathy, nonspecific to etiology. No seizures or epileptiform discharges were seen throughout the recording.  Please consider repeating study if concern for interictal activity or seizure persists.  Karas Pickerill Barbra Sarks

## 2019-02-17 NOTE — Progress Notes (Signed)
EEG complete - results pending 

## 2019-02-17 NOTE — Significant Event (Signed)
Rapid Response Event Note  Overview: Time Called: [place] Time: 1030 Event Type: Neurologic, Cardiac Called by 4th floor charge, Janett Billow, patient was in [place] and having loss of consciousness. Upon arrival, patient was not able to follow commands. Patient appeared to not be able to open left eye lid at all. Patient was able to localize pain more on the right but neglecting the left.  Initial Focused Assessment: Neuro: Unable to follow commands, only moaning to pain, not able to open left eyelid. Patient localizing more the right side of body then the left side. CBG was initially 50 at 1030 but treated with D50. CBG Recheck was 175. Cardiac: Patient in afib RVR 120s-130s. Placed on cardizem gtt. See BP in vital signs. Pulmonary: Patient did not appear to be in any respiratory distress, patient on 3LNC, O2 Sats 96-100%.   Interventions: Code Stroke Called, Tele Neuro activated at 1058.  -Obtained ABG due to AMS,  -Placed on continuous monitoring Transferred to CT, for STAT CT of the head.  Transferred to ICU/SD for closer monitoring  Tele Neuro MD: [id number], see note at [place] (if not transferred):        Raun Routh C

## 2019-02-17 NOTE — Progress Notes (Signed)
  Echocardiogram 2D Echocardiogram has been performed.  Sydney [name] 02/17/2019, 4:26 PM

## 2019-02-18 ENCOUNTER — Encounter (HOSPITAL_COMMUNITY): Payer: Medicare Other

## 2019-02-18 DIAGNOSIS — Z515 Encounter for palliative care: Secondary | ICD-10-CM | POA: Diagnosis not present

## 2019-02-18 DIAGNOSIS — I429 Cardiomyopathy, unspecified: Secondary | ICD-10-CM

## 2019-02-18 DIAGNOSIS — E872 Acidosis, unspecified: Secondary | ICD-10-CM | POA: Diagnosis present

## 2019-02-18 DIAGNOSIS — J9809 Other diseases of bronchus, not elsewhere classified: Secondary | ICD-10-CM | POA: Diagnosis present

## 2019-02-18 DIAGNOSIS — R0902 Hypoxemia: Secondary | ICD-10-CM

## 2019-02-18 DIAGNOSIS — I63512 Cerebral infarction due to unspecified occlusion or stenosis of left middle cerebral artery: Secondary | ICD-10-CM

## 2019-02-18 DIAGNOSIS — I4891 Unspecified atrial fibrillation: Secondary | ICD-10-CM

## 2019-02-18 DIAGNOSIS — D649 Anemia, unspecified: Secondary | ICD-10-CM | POA: Diagnosis not present

## 2019-02-18 DIAGNOSIS — R627 Adult failure to thrive: Secondary | ICD-10-CM | POA: Diagnosis not present

## 2019-02-18 DIAGNOSIS — I471 Supraventricular tachycardia: Secondary | ICD-10-CM | POA: Diagnosis present

## 2019-02-18 DIAGNOSIS — J90 Pleural effusion, not elsewhere classified: Secondary | ICD-10-CM | POA: Diagnosis not present

## 2019-02-18 DIAGNOSIS — Z7189 Other specified counseling: Secondary | ICD-10-CM

## 2019-02-18 DIAGNOSIS — T17500A Unspecified foreign body in bronchus causing asphyxiation, initial encounter: Secondary | ICD-10-CM | POA: Diagnosis present

## 2019-02-18 DIAGNOSIS — J189 Pneumonia, unspecified organism: Secondary | ICD-10-CM | POA: Diagnosis not present

## 2019-02-18 LAB — CBC
HCT: 26 % — ABNORMAL LOW (ref 36.0–46.0)
Hemoglobin: 8.1 g/dL — ABNORMAL LOW (ref 12.0–15.0)
MCH: 32.8 pg (ref 26.0–34.0)
MCHC: 31.2 g/dL (ref 30.0–36.0)
MCV: 105.3 fL — ABNORMAL HIGH (ref 80.0–100.0)
Platelets: 134 10*3/uL — ABNORMAL LOW (ref 150–400)
RBC: 2.47 MIL/uL — ABNORMAL LOW (ref 3.87–5.11)
RDW: 21.5 % — ABNORMAL HIGH (ref 11.5–15.5)
WBC: 14.2 10*3/uL — ABNORMAL HIGH (ref 4.0–10.5)
nRBC: 0.2 % (ref 0.0–0.2)

## 2019-02-18 LAB — GLUCOSE, CAPILLARY
Glucose-Capillary: 90 mg/dL (ref 70–99)
Glucose-Capillary: 115 mg/dL — ABNORMAL HIGH (ref 70–99)
Glucose-Capillary: 101 mg/dL — ABNORMAL HIGH (ref 70–99)
Glucose-Capillary: 112 mg/dL — ABNORMAL HIGH (ref 70–99)
Glucose-Capillary: 115 mg/dL — ABNORMAL HIGH (ref 70–99)
Glucose-Capillary: 96 mg/dL (ref 70–99)

## 2019-02-18 LAB — BASIC METABOLIC PANEL
Anion gap: 9 (ref 5–15)
BUN: 27 mg/dL — ABNORMAL HIGH (ref 8–23)
CO2: 17 mmol/L — ABNORMAL LOW (ref 22–32)
Calcium: 7.7 mg/dL — ABNORMAL LOW (ref 8.9–10.3)
Chloride: 113 mmol/L — ABNORMAL HIGH (ref 98–111)
Creatinine, Ser: 0.96 mg/dL (ref 0.44–1.00)
GFR calc Af Amer: >60 mL/min (ref >60)
GFR calc non Af Amer: 54 mL/min — ABNORMAL LOW (ref >60)
Glucose, Bld: 118 mg/dL — ABNORMAL HIGH (ref 70–99)
Potassium: 4.4 mmol/L (ref 3.5–5.1)
Sodium: 139 mmol/L (ref 135–145)

## 2019-02-18 LAB — LIPID PANEL
Cholesterol: 124 mg/dL (ref 0–200)
HDL: 20 mg/dL — ABNORMAL LOW (ref >40)
LDL Cholesterol: 79 mg/dL (ref 0–99)
Total CHOL/HDL Ratio: 6.2 RATIO
Triglycerides: 123 mg/dL (ref <150)
VLDL: 25 mg/dL (ref 0–40)

## 2019-02-18 LAB — LACTIC ACID, PLASMA: Lactic Acid, Venous: 2 mmol/L (ref 0.5–1.9)

## 2019-02-18 LAB — HEMOGLOBIN A1C
Hgb A1c MFr Bld: 5 % (ref 4.8–5.6)
Mean Plasma Glucose: 96.8 mg/dL

## 2019-02-18 LAB — T4, FREE: Free T4: 1.15 ng/dL — ABNORMAL HIGH (ref 0.61–1.12)

## 2019-02-18 LAB — PROTIME-INR
INR: 1.4 — ABNORMAL HIGH (ref 0.8–1.2)
Prothrombin Time: 16.5 seconds — ABNORMAL HIGH (ref 11.4–15.2)

## 2019-02-18 LAB — TSH: TSH: 1.333 u[IU]/mL (ref 0.350–4.500)

## 2019-02-18 LAB — MAGNESIUM: Magnesium: 1.8 mg/dL (ref 1.7–2.4)

## 2019-02-18 MED ORDER — SODIUM CHLORIDE 0.9 % IV SOLN
2.0000 g | INTRAVENOUS | Status: DC
  Administered 2019-02-18 – 2019-02-19 (×2): 2 g via INTRAVENOUS
  Filled 2019-02-18 (×2): qty 2

## 2019-02-18 MED ORDER — DEXTROSE IN LACTATED RINGERS 5 % IV SOLN
INTRAVENOUS | Status: DC
  Administered 2019-02-18 – 2019-02-19 (×2): via INTRAVENOUS

## 2019-02-18 NOTE — Progress Notes (Signed)
Prior to pt's daughter leaving the hospital tonight, this RN informed the daughter that she was going to be transferred to [facility] after she had a CTA of head and neck performed per [facility]'s request. This RN had received a call from Dr. Posey Pronto saying that pt would be going to [facility] however needed the CTA first. However after returning from Radiology, this RN spoke with Sonia Baller from the [facility] transition center. It was explained that Neurology at [facility] had refused to accept the pt and that Dr. Posey Pronto was now in communication with General Medicine MDs at [facility] about admitting the pt instead. At this time, daughter was called and updated about the pt's unclear transfer to [facility] vs. staying within the [facility] system and being transferred to [facility] once bed was available. Daughter was upset to hear that a transfer to [facility] wasn't actually confirmed and said, "a big mistake has been made because you told me that she was accepted at [facility]." This RN acknowledged that there was miscommunication however attempted to reassure the daughter that the pt is not being neglected and she is still receiving appropriate care with [place] here at [facility]. This RN agreed to keep the pt's daughter informed if new information about pt transferring became available.

## 2019-02-18 NOTE — Progress Notes (Signed)
Pharmacy Antibiotic Note  Sydney [name] is a 85 y.o. female admitted on 02/18/2019 with UTI  Pharmacy has been consulted for cefepime dosing.  Plan: Cefepime 2gm IV q24h for CrCl ~ 31.5mls/min Follow renal function and cultures  Height: 5\' 3"  (160 cm) Weight: 103 lb 6.3 oz (46.9 kg) IBW/kg (Calculated) : 52.4  Temp (24hrs), Avg:97.7 F (36.5 C), Min:97.3 F (36.3 C), Max:98.3 F (36.8 C)  Recent Labs  Lab 02/07/2019 1103 02/16/19 0505 02/17/19 0519 02/17/19 1318 02/18/19 0222  WBC 14.6* 15.1* 11.4* 13.0* 14.2*  CREATININE 1.22* 1.24* 1.20* 1.16* 0.96  LATICACIDVEN  --   --  1.9 2.4* 2.0*    Estimated Creatinine Clearance: 31.1 mL/min (by C-G formula based on SCr of 0.96 mg/dL).    Allergies  Allergen Reactions  . Demerol [Meperidine] Other (See Comments)    Excessive sweating, cramping  . Other     No Antibiotic without consultation with her primary physician.  . Codeine Rash  . Sulfa Antibiotics Itching and Other (See Comments)    Reaction unknown  . Tomato Itching     Thank you for allowing pharmacy to be a part of this patient's care.  Dolly Rias RPh 02/18/2019, 10:22 AM Pager [phone]

## 2019-02-18 NOTE — Consult Note (Signed)
Consultation Note Date: 02/18/2019   Patient Name: Sydney [name]  DOB: 04/05/1931  MRN: [id number]  Age / Sex: 85 y.o., female  PCP: [name], MD Referring Physician: Lavina Hamman, MD  Reason for Consultation: Establishing goals of care  HPI/Patient Profile: 85 y.o. 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  [name] and development of A. fib with RVR.  Of note, palliative medicine team has seen Sydney [name] multiple times and each time her daughter has been clear in her desire not to have any discussions of hospice or comfort care.  In the past admissions, there has been a limited amount of interaction with family per family preference not to have our team as part of care plan.  Palliative consulted for goals of care.  Clinical Assessment and Goals of Care: I saw and examined Sydney [name] this morning.  She was lying in bed was not interactive.  In the past, she has been limited in her ability to participate in conversation, but she was able to acknowledge my presence and speak a few words to me.  I called and was able to reach her daughter, [name], later this morning.  I let [name] know that we had spoken in the past and reintroduced palliative care as specialized medical care for people living with serious illness for goal of improving quality of life for both the patient and the family.  At this point, [name] noted that she was getting another phone call and needed to go to answer it.  She did state that she may try to call me later today.  As  noted, [name] has been clear in her desire not to have palliative care as part of the team caring for her mother during past admissions.  SUMMARY OF RECOMMENDATIONS   - Family desires full scope treatment.  As noted by multiple other providers, I am concerned that Sydney [name] will continue to decline regardless of interventions moving forward.  I agree that undergoing CPR or mechanical ventilation would not offer medical benefit to Sydney [name] in the event of cardiac or respiratory arrest.  Plan to continue conversation with patient's daughter regarding this if she calls back and is willing to engage in conversation. -Patient's daughter has been fully invested in plan to continue with any and all offered interventions.  While I did not get to discuss this with her today as she had to answer another call, it seems that she is consistent in this desire in conversation with other members of the care team.  Her goal per discussion with other members of her care team is to get her mother transferred to another institution.   - No further palliative specific recommendations at this time.  I left my  card with my contact information in her mother's room.  Please call if there are other specific needs with which we can be of assistance.  Code Status/Advance Care Planning:  Full code  Prognosis:   At this time, goal remains for continued aggressive interventions.  If her goals were to change to a focus on comfort, she would be eligible for hospice services at any time moving forward if family desired to forgo further aggressive therapies and elect her hospice benefits..  Discharge Planning: To Be Determined      Primary Diagnoses: Present on Admission: . HCAP (healthcare-associated pneumonia) . Acute ischemic left MCA stroke ([place]) . SVT (supraventricular tachycardia) ([place]) . Metabolic acidosis . Meningioma ([place]) . Hypokalemia . Anemia . Glaucoma . Physical deconditioning . History of  Clostridioides difficile colitis . Sacral ulcer ([place]) . Pleural effusion . Protein-calorie malnutrition, severe . Mucus plugging of bronchi . Failure to thrive in adult   I have reviewed the medical record, interviewed the patient and family, and examined the patient. The following aspects are pertinent.  Past Medical History:  Diagnosis Date  . Anemia    years ago  . Aortic atherosclerosis ([place])   . Arthritis    "left hand" (02/17/2013)  . Bilateral inguinal hernia    INCIDENTAL CT 1/20  . C. difficile diarrhea   . Cataract   . Dehydration 12/29/2018  . Diplopia    CRANIAL 6 NERVE PALSY  . Dyslipidemia   . Gastritis   . Glaucoma   . Hypertension   . Leukopenia   . Meningioma ([place]) 05/01/2015  . Osteoporosis 02/2014   T score -2.5  followed by Dr. Lysle Rubens  . Palpitations    PACs, PVCs and short runs of atrial tachycardia on Holter monitoring  . PVD (peripheral vascular disease) ([place])   . Rhinitis   . Syncopal episodes 2003  . TIA (transient ischemic attack) 1990's  . Trigger thumb of left hand    Social History   Socioeconomic History  . Marital status: Widowed    Spouse name: Not on file  . Number of children: 1  . Years of education: 38  . Highest education level: Not on file  Occupational History  . Occupation: retired  Scientific laboratory technician  . Financial resource strain: Not on file  . Food insecurity    Worry: Not on file    Inability: Not on file  . Transportation needs    Medical: Not on file    Non-medical: Not on file  Tobacco Use  . Smoking status: Never Smoker  . Smokeless tobacco: Never Used  Substance and Sexual Activity  . Alcohol use: No  . Drug use: No  . Sexual activity: Not Currently    Comment: 1st intercourse 85 yo-Fewer than 5 partners  Lifestyle  . Physical activity    Days per week: Not on file    Minutes per session: Not on file  . Stress: Not on file  Relationships  . Social Herbalist on phone: Not on file    Gets  together: Not on file    Attends religious service: Not on file    Active member of club or organization: Not on file    Attends meetings of clubs or organizations: Not on file    Relationship status: Not on file  Other Topics Concern  . Not on file  Social History Narrative   Patient does not drink caffeine.   Patient is right handed.  Family History  Problem Relation Age of Onset  . Hypertension Father   . Heart attack Father   . Stroke Brother   . Stroke Maternal Grandmother    Scheduled Meds: .  stroke: mapping our early stages of recovery book   Does not apply Once  . aspirin  300 mg Rectal Daily  . chlorhexidine  15 mL Mouth Rinse BID  . Chlorhexidine Gluconate Cloth  6 each Topical Daily  . clopidogrel  75 mg Oral Daily  . enoxaparin (LOVENOX) injection  30 mg Subcutaneous Q24H  . feeding supplement (ENSURE ENLIVE)  237 mL Oral BID BM  . feeding supplement (PRO-STAT SUGAR FREE 64)  30 mL Oral BID WC  . latanoprost  1 drop Both Eyes QHS  . mouth rinse  15 mL Mouth Rinse q12n4p  . megestrol  400 mg Oral BID  . mirtazapine  45 mg Oral QHS  . multivitamin with minerals  1 tablet Oral Daily  . nutrition supplement (JUVEN)  1 packet Oral BID BM   Continuous Infusions: . sodium chloride Stopped (02/17/19 1441)  . ceFEPime (MAXIPIME) IV Stopped (02/18/19 1157)  . dextrose 5% lactated ringers 50 mL/hr at 02/18/19 0906  . metronidazole Stopped (02/18/19 1026)   PRN Meds:.sodium chloride, acetaminophen, LORazepam, traMADol   Allergies  Allergen Reactions  . Demerol [Meperidine] Other (See Comments)    Excessive sweating, cramping  . Other     No Antibiotic without consultation with her primary physician.  . Codeine Rash  . Sulfa Antibiotics Itching and Other (See Comments)    Reaction unknown  . Tomato Itching   Review of Systems Unable to assess  Physical Exam  General: Does not respond to gently verbal or tactile stimulation, frail, appears comfortable.   Heart: Regular rate and rhythm. No murmur appreciated. Lungs: Decreased air movement, Scattered rhonchi Abdomen: Soft  Ext: No significant edema Skin: Warm and dry, wounds not examined  Vital Signs: BP (!) 103/55   Pulse 81   Temp 98 F (36.7 C) (Axillary)   Resp 15   Ht 5\' 3"  (1.6 m)   Wt 46.9 kg   SpO2 100%   BMI 18.32 kg/m  Pain Scale: PAINAD   Pain Score: 6    SpO2: SpO2: 100 % O2 Device:SpO2: 100 % O2 Flow Rate: .O2 Flow Rate (L/min): 3 L/min  IO: Intake/output summary:   Intake/Output Summary (Last 24 hours) at 02/18/2019 1421 Last data filed at 02/18/2019 0700 Gross per 24 hour  Intake 1276.85 ml  Output 350 ml  Net 926.85 ml    LBM: Last BM Date: (PTA Pt unsure) Baseline Weight: Weight: 46.9 kg Most recent weight: Weight: 46.9 kg     Palliative Assessment/Data:   Flowsheet Rows     Most Recent Value  Intake Tab  Referral Department  Hospitalist  Unit at Time of Referral  ICU  Palliative Care Primary Diagnosis  Neurology  Date Notified  02/17/19  Palliative Care Type  Return patient Palliative Care  Reason for referral  Clarify Goals of Care  Date of Admission  02/17/19  Date first seen by Palliative Care  02/18/19  # of days Palliative referral response time  1 Day(s)  # of days IP prior to Palliative referral  0  Clinical Assessment  Palliative Performance Scale Score  20%  Psychosocial & Spiritual Assessment  Palliative Care Outcomes  Patient/Family meeting held?  No      Time Total: 45 minutes Greater than 50%  of this  time was spent counseling and coordinating care related to the above assessment and plan.  Signed by: Micheline Rough, MD   Please contact Palliative Medicine Team phone at [phone] for questions and concerns.  For individual provider: See Shea Evans

## 2019-02-18 NOTE — Progress Notes (Addendum)
ID PROGRESS NOTE   Ms Sydney [name] is 85yo F remote hx of cdifficile requiring fecal transplant, hx of TIA, FTT, repeated hospitalizations of late, non healing sacral wound, htn, new onset acute L-MCA infarct in the setting of afib with RVR, now admitted for worsening acute stroke. There was concern that she has due to altered mental status which she has remained afebrile. Due to AMS (though difficult to tell if all due to stroke vs other source of infection) she had blood cx and urine cx and chest cxr done. ua shows pyuria with PsA isolated in in urine cx. Concern that her presentation is worsening due to untreated infection, primary team asked to weigh in. cxr shows bilateral effusion no new infiltrates  Plan: - can start cefepime, to see if any improvement in sx. Would do short course. If no visible improvement in 48-72 hr would d/c abtx - difficult to tell if her mental status changes are from superimposed infection, suspect it is all due to new stroke - currently on IV metronidazole, - continue to monitor for improvement - will see her tomorrow and discuss treatment plan with her daughter - agree with continued palliative care discussions  Caren Griffins B. [facility] Infectious [facility] [phone]

## 2019-02-18 NOTE — Progress Notes (Signed)
I have reviewed Dr Agustina Caroli ote as well as neuro notes and today's cards eval by Dr Lucianne Lei, examined the pt and discussed very limited options with nursing.   Although she remains full code, I agree with Dr Lamonte Sakai that more aggressive measures than what we are presently offering would simply prolong her suffering with no chance of a meaningful recovery at this point and therefore would constitute medical futility which we are under no legal or moral obligatio to either offer this or to perform during this admit.   If she does pass during this admit, one of the [facility] doctors can immediately stop the code (should it be called) on the basis of medical futility.   Total time spent with pt = 25 minutes   Christinia Gully, MD Pulmonary and [place] [phone] After 5:30 PM or weekends, use [facility] [phone]

## 2019-02-18 NOTE — Progress Notes (Signed)
SLP Cancellation Note  Patient Details Name: Sydney [name] MRN: [id number] DOB: 08-11-1930   Cancelled treatment:       Reason Eval/Treat Not Completed: Fatigue/lethargy limiting ability to participate; despite max encouragement and multimodal cues, pt unable to participate d/t lethargy/decreased mentation for SLE/BSE completion; adamantly refused oral care.   Elvina Sidle, M.S., CCC-SLP 02/18/2019, 3:46 PM

## 2019-02-18 NOTE — Progress Notes (Signed)
Pt with nonsustained run of SVT in the rate of 160s. Pt asymptomatic and vitals signs are stable. Pt is now showing NSR rates in the 70s. MD on call notified at this time. Will continue to monitor.

## 2019-02-18 NOTE — Progress Notes (Signed)
I spoke to Dr. Posey Pronto on the phone as he was requesting advice since patient did not have a bed to transfer to [facility].   I reviewed her imaging personally.  There is an acute left MCA distribution ischemic infarct and she was found to be in atrial fibrillation.  I recommended holding off on anticoagulation for a couple of weeks and repeating CT Brain at that time to ensure no natural hemorrhagic transformation before initiating anticoagulation.  I also recommended against the ASA + Plavix combination that is on her orders.  She is not swallowing pills regardless.  I told him to stop the Plavix and change ASA to rectal 300 mg qd.     Rogue Jury, MS, MD

## 2019-02-18 NOTE — Progress Notes (Addendum)
[facility] Progress Note  Patient: Sydney [name] [id number]   PCP: [name], MD DOB: 1930-05-08   DOA: 02/01/2019   DOS: 02/18/2019   Date of Service: the patient was seen and examined on 02/18/2019  Chief Complaint  Patient presents with   Low oxygen saturation   Brief hospital course: Patient presented with complaints of hypoxia on 02/11/2019.  Chest x-ray was performed on 02/13/2019 which showed large bilateral pleural effusion left more than right, Evidence of worsening bilateral pleural effusion as well as possible infiltrate. CT scan of the chest without contrast was performed which showed complete atelectasis of the left lower lobe and large amount of abnormal density in the left mainstem bronchus and lower lobe bronchi concerning for aspirated material or mucous plugging. Patient was requiring 3-4 L of oxygen to maintain 100% saturation on admission.  Initial plan was to perform thoracentesis and treat aspiration pneumonia.  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 On 02/16/2019 patient was unable to tolerate thoracentesis safely and therefore the procedure was aborted.  Order for flutter valve and chest vest for patient's mucous plugging although chest vest was refused by family and patient was not successfully able to perform flutter valve.  Given patient's stability of oxygenation as well as multiple comorbidities decision was made that the patient is not a bronchoscopy candidate.  On 02/17/2019 patient went into A. fib with RVR, patient was given IV Cardizem bolus with rate control followed by started on Cardizem infusion. Around 9 AM patient was last seen normal.  Around 10:19 AM I was called at bedside to evaluate the patient as she was not responding to painful stimuli.  At the time of my evaluation patient was lethargic unable to follow any commands.  Code stroke  was called.  CT head was performed.  Patient was evaluated by teleneurology.  Patient was hypoglycemic with blood sugar in 50s and was given an amp of D50 with eventual improvement in her blood sugar at 175 without any improvement in patient's mentation.  After evaluation by teleneurology it was felt that the patient may have suffered from seizure and is currently postictal and since there was no focal finding on exam it was felt that the patient is suffering from metabolic encephalopathy. Stat EEG was performed which was not showing any acute seizures and no seizure focus as well and showed metabolic encephalopathy. Metabolic work-up was unremarkable other than hyperglycemia. ABG showed no hypercarbia and no significant hypoxia or acidosis or alkalosis.  Stat MRI brain was performed which was positive for acute stroke in the left MCA territory. Discussed with Dr. Cheral Marker in neurology, based on the MRI, it appears that the patient may have completed her infarct and therefore recommended transfer to [facility] and further stroke work-up as well as aspirin.  Not a candidate for aggressive intervention per neurology.  Given the size of the stroke anticoagulation was not recommended for A. fib. PT OT and speech therapy were already consulted on this patient. Echocardiogram was performed which shows 40 to 45% EF without any significant valvular abnormality. Patient remains n.p.o. For hypoglycemia patient was given IV D5 normal saline. A. fib with RVR was treated with IV Cardizem but patient converted to normal sinus rhythm therefore Cardizem infusion was on hold. CCM evaluated the patient as well at bedside.  At this point family requested patient to be transferred to [facility] as first priority and [facility]  [facility] as second priority before considering transfer to [place] currently does not have capacity for stepdown level patient care and therefore recommended to  call back tomorrow. [facility] is currently evaluating the patient for transfer.  02/18/2019.  No significant change in patient's condition.  Cardiology was consulted for A. fib with RVR as well as new systolic dysfunction.  [facility] to [facility] with [facility], [facility], and [facility] for potential transfer.  [facility] or [facility] does not have any bed available right now for stepdown or intermediate level care.  [facility] has accepted the patient for transfer under neurology service although they do also do not have any beds available right now and anticipate no availability in the next few days.  Discussed with daughter again regarding goals of care and wants to continue full code ask her about nutrition and she has not made any decisions yet.  Currently further plan is continue further stroke work-up and supportive care.  Continue to engage with the family regarding goals of care.  Subjective: Patient is nonverbal.  Unable to follow any commands.  Withdraws to painful stimuli.  Purposefully trying to push when being disturbed with her right upper extremity.  No acute events overnight other than a brief SVT event on telemetry.  Assessment and Plan: 1.  Acute left MCA infarct Code stroke was called, patient was felt suffering from metabolic encephalopathy as she was nonfocal on the initial evaluation and due to hypoglycemia seizure was high in the differential. MRI brain confirms left MCA infarct. Suspect this is secondary to A. fib as etiology. Unable to anticoagulate the patient at this point given size of the infarct. Patient likely appear to have completed her stroke therefore not a candidate for any aggressive intervention per neurology. CTA head and neck were initially not performed due to patient's poor creatinine clearance of 25 and acute kidney injury on chronic kidney disease as well as risk for further worsening of her  renal function. After discussion with the family as well as neurologist CTA head and neck was ordered after their agreement. Aspirin 300 suppository given. N.p.o. PT OT and speech evaluation. Patient to be transferred to [facility] for stroke work-up. Family currently wants to pursue aggressive care but would like to have thorough discussion of risk and benefit before any change in plan of care.  2.  Acute hypoxic respiratory failure Bilateral pleural effusion left greater than right. Left lower lobe atelectasis Mucous plugging versus aspiration of food debris's on left main bronchus. Presented with hypoxia identified at wound care center when patient presented with follow-up. Per daughter there were no acute events or complaints after her discharge from the hospital. Per daughter patient was eating well at home. CT scan shows evidence of worsening bilateral pleural effusion thought to be secondary to diastolic dysfunction as well as reduced oncotic pressure. Admitting provider discussed with infectious disease Dr. Johnnye Sima and started the patient on IV Flagyl for possible aspiration coverage. 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 IR was consulted for thoracentesis although patient was white initially agreeable on the table was refusing to go through the procedure and was screaming and begging not to perform the procedure, moving a lot around therefore the procedure was aborted. Decision was made to repeat attempt next day with IV sedation if the patient's daughter agrees for it. Given current  acute stroke as well as A. fib with RVR currently holding off on further procedure unless absolutely needed. Patient's oxygenation remained stable on 3 L at 100%. Monitor.  For mucous plugging/food debris's patient was deemed not a candidate for bronchoscopic evaluation given her multiple comorbidities.  Chest vest was  ordered but daughter felt that it would be too strong for the patient therefore was discontinued. Flutter valve was ordered but the patient is unable to perform consistently and is unlikely to perform consistently given her acute stroke right now with poor responsiveness. Patient remains n.p.o. for now.  3.  A. fib with RVR/now sinus rhythm CHA2DS2-VASc Score 4/5 No prior history of A. fib identified in the chart. Likely in the setting of her respiratory illness. Currently back in normal sinus rhythm. Initially given 10 mg dose of IV Cardizem bolus followed by 5 mg infusion with titration upwards. Converted to normal sinus rhythm spontaneously. Continue to maintain in the stepdown unit.  Use as needed Lopressor in case of RVR although avoid hypotension in patient with stroke. Unlikely that the acute A. fib has caused her acute CVA.  Although the patient has not been able to take her aspirin or Plavix due to her refusal to take this medicine as well as refusal to use Lovenox. May need amiodarone infusion if remains persistently tachycardic. Now that the echocardiogram is showing low EF Cardizem is not an option. Use IV amiodarone for RVR and if the blood pressure allows IV Lopressor for urgent rate control. Per neurology no anticoagulation for 2 weeks.  Repeat CT head to rule out hemorrhagic transformation before initiation of anticoagulation at the end of 2 weeks.  4.  Chronic diastolic CHF with acute systolic worsening. Patient did not have any chest pain early in the morning when I evaluated her when she was in A. fib with RVR as well as prior to that. EKG shows T wave inversions in the lateral leads but otherwise no significant ischemic findings. At present continue to treat conservatively. Patient does have evidence of third spacing given her large pleural effusion but due to her poor p.o. intake patient is currently on IV fluids therefore monitor for volume overload. Continue to monitor  for now. Not a candidate for anticoagulation given her acute stroke, not a candidate for intervention given her multiple comorbidities. Cardiology consulted, currently signed off.  Recommend to continue aspirin, no further work-up recommended.  5.  Acute kidney injury Based on renal function of serum creatinine 0.7-0.8. Presents with a serum creatinine of 1.2-1.24.  Currently 1.16. Evidence of dehydration clinically. Patient is currently receiving IV fluids although at risk for volume overload and therefore monitor closely. Patient is also at risk for worsening of renal function given the need for CT scan on contrast but currently family agrees for the procedure and neurology has requested the procedure. Renal function somewhat better after receiving IV hydration continue to monitor for volume overload.  6.  Hypokalemia. Replaced aggressively.  7.  Metabolic acidosis. Likely in the setting of acute kidney injury. Currently receiving IV hydration monitor. ABG shows no evidence of respiratory acidosis.  8.  Failure to thrive, poor p.o. intake, severe protein calorie malnutrition Patient has progressive decline over last few months. Multiple hospitalization, 8/21, 8/24-10/6, 10/8, and now 10/21. Extensive evaluation performed by multiple specialists. Patient has poor p.o. intake.  GI evaluation was performed but no clear etiology was identified.  Core track was attempted in the past.  GI felt that the patient is not  a candidate for PEG tube placement.  IR felt that performing PEG tube in the presence of ascites is high risk. After that initial discussion patient was felt not a candidate for any feeding tube placement and the family has also decided not to pursue that. Patient is at risk for aspiration given her dementia and deconditioning and multiple speech evaluation has been performed. Patient was allowed dysphagia 3 diet with thin liquids. Presents this admission with a possible  aspiration event. Severely dehydrated at the time of my evaluation with cachexia. Patient has sacral ulcer which is nonhealing despite aggressive intervention including hydrotherapy. Patient is refusing Ensure, prostat, Juven during this hospital stay. Now that the patient is suffering from an acute left MCA moderate-sized stroke her p.o. intake is going to be worse than before. Sacral ulcer wound care is causing significant pain to the patient but patient is also refusing pain medication as well as family prefers not to use them. Given the lack of adequate nutrition I suspect her condition will worsen with or without the presence of the stroke down the road. Patient is suffering from acute kidney injury from poor p.o. intake as well as hypoglycemia. Currently receiving IV D5 normal saline. Multiple evaluation from ethics committee as well as palliative care has been performed in the past as well. Family wants to pursue aggressive care at present. Continue to engage with family regarding goals of care.  9.  Goals of care discussion. I spent extensive amount of time discussing with the family and answering all the questions and concerns as well as explaining regarding various options of care that the patient should and should not get. Patient has acute stroke leaving her with significant deficit. Patient has worsening bilateral pleural effusion but unable to tolerate thoracentesis or diuretics and at risk for damage from either procedures. Patient also has acute kidney injury from p.o. intake and her p.o. intake is not improving despite aggressive care. Given her aspiration risk do not think that the patient will benefit from any type of feeding tube placement. Patient has acute urinary retention and is now requiring Foley catheter. During the stroke work-up echocardiogram now shows EF of 40% with diffuse wall motion abnormality without any valvular abnormality. Patient also has evidence of  aspiration based on the CT scan with left mainstem bronchus filled with mucous plug versus food debris. Patient has nonhealing sacral ulcer present since August progressively worsening and without any adequate nutrition this wound is unlikely to heal and will lead to further deconditioning. Her acute stroke is going to make the wound worse as well due to lack of mobility and poor p.o. intake. Patient is suffering from pain from the sacral ulcer as well as having difficulty breathing from bilateral pleural effusion. With all these comorbidities patient's prognosis is significantly guarded and in the setting of acute stroke as well as A. fib with RVR and now with new low EF prognosis is highly limited. I am concerned that the patient may have life-threatening event within the next week. PCCM was consulted and I agree with their recommendation that in the event of cardiac arrest patient's chances of survival is highly unlikely and in the event of survival meaningful recovery is near impossible given her acute stroke.  Patient should be DNR but family currently wants to pursue aggressive care and full code.  Explained to the family that DNR does not mean do not treat and we can still pursue other medical interventions within the limits of DNR. Explained  to the family that given multiple organ failure patient is an active but slow process of dying and would benefit from transitioning to comfort care/hospice. Palliative care was consulted although in the past daughter who is the POA has clearly refused involvement of palliative care or any discussion in that regard. Ethics committee was also consulted for further assistance.  At this point given patient's multiple comorbidities I advise that the patient should not undergo CPR or intubation as there is no medical benefit of this procedures to the patient.  I also advised transition to comfort care as the patient is clearly suffering from pain from sacral ulcer  which is not being treated right now.  Discussed with daughter on 02/18/2019 regarding lack of nutrition and chances of poor p.o. intake going forward in the setting of acute stroke and ask her if she would like to pursue with core track placement versus continue with IV D5 LR given that both presents risk, 1 can make aspiration worse and 1 can make heart failure worse. Currently family has not made any decision regarding any intervention for nutrition and wants to continue with fluids. Also discussed regarding CODE STATUS, daughter wants to continue full code.  Explained to her that in the setting of now new acute stroke as well as systolic dysfunction chances of patient surviving a cardiac arrest is next to impossible and recommend DNR/DNI.  Again explained that DNR/DNI does not mean do not treat.  10.  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 Currently no diarrhea. Monitor. Avoid PPI avoid other medication that can cause C. difficile or increase the risk. Currently on IV Flagyl for aspiration pneumonia. Currently patient is on IV cefepime for Pseudomonas UTI.  We will continue it for a total of 5 days.  Unable to provide oral vancomycin due to patient's acute stroke and dysphagia therefore we will continue with IV Flagyl.  11.  Left buttock decubitus ulcer. POA. Continue wound care per wound care recommendation. Family wants to pursue more frequent dressing changes, reconsulted wound care.  12.  History of glaucoma. Continue eyedrops per  13.  Severe protein calorie malnutrition. Decreased appetite but Patient is on Remeron and Megace which the patient is refusing during his hospital stay and is now unable to take due to her acute stroke. Not a candidate for PEG tube based on GI and IR evaluation in the past. Currently at high risk for aspiration. Continue to pursue goals of care discussion.  14.  Pseudomonas UTI. Discussed with Dr. Graylon Good on  phone. Currently recommending empirically treating the patient with IV cefepime for short duration. Unable to provide oral vancomycin given patient's acute stroke and dysphagia therefore we will continue with IV Flagyl.  Pressure Injury 02/09/2019 Buttocks Left Unstageable - Full thickness tissue loss in which the base of the ulcer is covered by slough (yellow, tan, gray, green or brown) and/or eschar (tan, brown or black) in the wound bed. (Active)  02/18/2019 2300  Location: Buttocks  Location Orientation: Left  Staging: Unstageable - Full thickness tissue loss in which the base of the ulcer is covered by slough (yellow, tan, gray, green or brown) and/or eschar (tan, brown or black) in the wound bed.  Wound Description (Comments):   Present on Admission: Yes   Diet: NPO pending stroke eval DVT Prophylaxis: Subcutaneous Lovenox currently refusing  Advance goals of care discussion: Full code  Family Communication: family was present at bedside, at the time of interview. The pt  provided permission to discuss medical plan with the family. Opportunity was given to ask question and all questions were answered satisfactorily.   Disposition:  Discharge to be determined.  Consultants: Palliative care PCCM, neurology, telemetry neurology, phone consultation with ID, ethics committee, consultation with [facility] neurology Procedures: Echocardiogram, EEG  Scheduled Meds:   stroke: mapping our early stages of recovery book   Does not apply Once   aspirin  300 mg Rectal Daily   chlorhexidine  15 mL Mouth Rinse BID   Chlorhexidine Gluconate Cloth  6 each Topical Daily   enoxaparin (LOVENOX) injection  30 mg Subcutaneous Q24H   feeding supplement (ENSURE ENLIVE)  237 mL Oral BID BM   feeding supplement (PRO-STAT SUGAR FREE 64)  30 mL Oral BID WC   latanoprost  1 drop Both Eyes QHS   mouth rinse  15 mL Mouth Rinse q12n4p   nutrition supplement (JUVEN)  1 packet Oral BID BM   Continuous  Infusions:  sodium chloride Stopped (02/17/19 1441)   ceFEPime (MAXIPIME) IV Stopped (02/18/19 1157)   dextrose 5% lactated ringers 50 mL/hr at 02/18/19 1619   metronidazole Stopped (02/18/19 1026)   PRN Meds: sodium chloride, acetaminophen, LORazepam Antibiotics: Anti-infectives (From admission, onward)   Start     Dose/Rate Route Frequency Ordered Stop   02/18/19 1100  ceFEPIme (MAXIPIME) 2 g in sodium chloride 0.9 % 100 mL IVPB     2 g 200 mL/hr over 30 Minutes Intravenous Every 24 hours 02/18/19 1022     02/16/19 1500  cephALEXin (KEFLEX) capsule 500 mg     500 mg Oral Every 12 hours 02/16/19 1358 02/16/19 2220   01/28/2019 1800  metroNIDAZOLE (FLAGYL) IVPB 500 mg     500 mg 100 mL/hr over 60 Minutes Intravenous Every 8 hours 02/18/2019 1723     02/22/2019 1715  vancomycin (VANCOCIN) 50 mg/mL oral solution 125 mg  Status:  Discontinued     125 mg Oral Daily 02/09/2019 1710 02/09/2019 1728     Objective: Physical Exam: Vitals:   02/18/19 1300 02/18/19 1400 02/18/19 1500 02/18/19 1600  BP: (!) 121/59 122/60 120/63 (!) 125/55  Pulse: 88 76 78 72  Resp: 18 16 17 14   Temp:      TempSrc:      SpO2:      Weight:      Height:        Intake/Output Summary (Last 24 hours) at 02/18/2019 1623 Last data filed at 02/18/2019 1619 Gross per 24 hour  Intake 1845.55 ml  Output 500 ml  Net 1345.55 ml   Filed Weights   01/28/2019 1052  Weight: 46.9 kg   General: Lethargic but responds to painful stimuli and occasionally verbal stimuli, not able to follow any commands and not oriented to time, place, and person. Appear in moderate distress, affect unresponsive Eyes: PERRL, Conjunctiva normal ENT: Oral Mucosa Clear, dry  Neck: difficult to assess  JVD, no Abnormal Mass Or lumps Cardiovascular: S1 and S2 Present, no Murmur, peripheral pulses symmetrical Respiratory: increased respiratory effort, Bilateral Air entry equal and Decreased, no signs of accessory muscle use, bilateral basal  Crackles, no wheezes Abdomen: Bowel Sound present, Soft and difficult to assess tenderness, no hernia Skin: no rashes  Extremities: no Pedal edema, no calf tenderness Neurologic: Motor strength reduced at right upper, left upper, right lower and left lower extremity, Sensation grossly normal to painful stimuli, Reflex difficult to assess and Resting tremor noted bilateraly Gait not checked due to patient safety  concerns   Data Reviewed: I have personally reviewed and interpreted daily labs, tele strips, imagings as discussed above. I reviewed all nursing notes, pharmacy notes, vitals, pertinent old records I have discussed plan of care as described above with RN and patient/family.  CBC: Recent Labs  Lab 01/29/2019 1103 02/16/19 0505 02/17/19 0519 02/17/19 1318 02/18/19 0222  WBC 14.6* 15.1* 11.4* 13.0* 14.2*  NEUTROABS  --   --  10.4* 12.2*  --   HGB 10.5* 10.9* 9.1* 9.3* 8.1*  HCT 33.3* 33.8* 28.9* 30.8* 26.0*  MCV 102.8* 100.9* 102.1* 106.9* 105.3*  PLT 197 171 152 143* [id number]*   Basic Metabolic Panel: Recent Labs  Lab 02/22/2019 1103 02/16/19 0505 02/17/19 0519 02/17/19 1318 02/18/19 0222  NA 134* 138 141 138 139  K 3.7 4.6 3.2* 3.3* 4.4  CL 103 105 109 110 113*  CO2 18* 18* 20* 17* 17*  GLUCOSE 72 49* 73 148* 118*  BUN 31* 33* 32* 30* 27*  CREATININE 1.22* 1.24* 1.20* 1.16* 0.96  CALCIUM 8.1* 8.3* 8.4* 8.0* 7.7*  MG  --   --  1.9 2.1 1.8   Liver Function Tests: Recent Labs  Lab 02/16/19 0505 02/17/19 0519 02/17/19 1318  AST 31 22 23   ALT 9 7 6   ALKPHOS 102 92 89  BILITOT 1.8* 1.1 1.3*  PROT 6.3* 5.8* 6.1*  ALBUMIN 2.4* 2.3* 2.4*   No results for input(s): LIPASE, AMYLASE in the last 168 hours. Recent Labs  Lab 02/17/19 1318  AMMONIA 15   Coagulation Profile: Recent Labs  Lab 02/18/19 0222  INR 1.4*   Cardiac Enzymes: No results for input(s): CKTOTAL, CKMB, CKMBINDEX, TROPONINI in the last 168 hours. BNP (last 3 results) No results for input(s):  PROBNP in the last 8760 hours. CBG: Recent Labs  Lab 02/17/19 1943 02/17/19 2317 02/18/19 0330 02/18/19 0739 02/18/19 1220  GLUCAP 87 107* 90 115* 101*   Studies: Ct Angio Head W Or Wo Contrast  Result Date: 02/17/2019 CLINICAL DATA:  Initial evaluation for acute stroke. EXAM: CT ANGIOGRAPHY HEAD AND NECK TECHNIQUE: Multidetector CT imaging of the head and neck was performed using the standard protocol during bolus administration of intravenous contrast. Multiplanar CT image reconstructions and MIPs were obtained to evaluate the vascular anatomy. Carotid stenosis measurements (when applicable) are obtained utilizing NASCET criteria, using the distal internal carotid diameter as the denominator. CONTRAST:  40mL OMNIPAQUE IOHEXOL 350 MG/ML SOLN COMPARISON:  Prior CT and MRI from earlier same day. FINDINGS: CTA NECK FINDINGS Aortic arch: Visualized aortic arch of normal caliber with normal 3 vessel morphology. Mild atheromatous plaque within the arch and about the origin of the great vessels without hemodynamically significant stenosis. Visualized subclavian arteries widely patent. Right carotid system: Right common and internal carotid arteries widely patent without stenosis, dissection or occlusion. Mild atheromatous plaque about the right bifurcation without significant narrowing. Left carotid system: Left common carotid artery widely patent from its origin to the bifurcation. Bulky eccentric calcified plaque at the origin of the left ICA with no more than mild 25% stenosis. Left ICA widely patent distally to the skull base without stenosis, dissection or occlusion. Vertebral arteries: Both vertebral arteries arise from the subclavian arteries. Right vertebral artery slightly dominant. Vertebral arteries widely patent within the neck without stenosis, dissection or occlusion. Skeleton: No acute osseous finding. No discrete lytic or blastic osseous lesions. Degenerative disc bulging noted at C6-7  without significant stenosis. Other neck: No other acute soft tissue abnormality within the neck. No  mass lesion or adenopathy. Upper chest: Large left with moderate right layering pleural effusions with associated atelectasis partially visualized. Oblique linear densities within the anterior right upper lobe most consistent with atelectasis and/or scarring. Visualized upper chest demonstrates no other acute finding. Review of the MIP images confirms the above findings CTA HEAD FINDINGS Anterior circulation: Petrous segments widely patent bilaterally. Mild scattered atheromatous plaque within the carotid siphons without significant stenosis. A1 segments, anterior communicating artery common anterior cerebral arteries widely patent. M1 segments widely patent bilaterally. Normal MCA bifurcations. On the left, there is abrupt occlusion of a proximal left M3 branch (series 6, image 463), in keeping with the left MCA territory infarct. Left MCA branches otherwise perfused. Distal right MCA branches well perfused without obstruction. Posterior circulation: Vertebral arteries widely patent to the vertebrobasilar junction without stenosis. Both picas patent. Basilar widely patent to its distal aspect without stenosis. Superior cerebral arteries patent bilaterally. Right PCA supplied via the basilar. Left PCA supplied via a hypoplastic left P1 segment as well as a robust left posterior communicating artery. PCAs widely patent to their distal aspects without stenosis. Venous sinuses: Patent. Calcified left parafalcine meningioma again noted. No invasion of the adjacent superior sagittal sinus. Anatomic variants: None significant. Review of the MIP images confirms the above findings IMPRESSION: 1. Acute proximal left M3 occlusion, consistent with the known left MCA territory infarct. 2. Mild scattered atherosclerotic change elsewhere throughout the major arterial vasculature of the head and neck. No other hemodynamically  significant or correctable stenosis. 3. Large left greater than right layering pleural effusions, partially visualized. Electronically Signed   By: Jeannine Boga M.D.   On: 02/17/2019 22:44   Ct Angio Neck W Or Wo Contrast  Result Date: 02/17/2019 CLINICAL DATA:  Initial evaluation for acute stroke. EXAM: CT ANGIOGRAPHY HEAD AND NECK TECHNIQUE: Multidetector CT imaging of the head and neck was performed using the standard protocol during bolus administration of intravenous contrast. Multiplanar CT image reconstructions and MIPs were obtained to evaluate the vascular anatomy. Carotid stenosis measurements (when applicable) are obtained utilizing NASCET criteria, using the distal internal carotid diameter as the denominator. CONTRAST:  13mL OMNIPAQUE IOHEXOL 350 MG/ML SOLN COMPARISON:  Prior CT and MRI from earlier same day. FINDINGS: CTA NECK FINDINGS Aortic arch: Visualized aortic arch of normal caliber with normal 3 vessel morphology. Mild atheromatous plaque within the arch and about the origin of the great vessels without hemodynamically significant stenosis. Visualized subclavian arteries widely patent. Right carotid system: Right common and internal carotid arteries widely patent without stenosis, dissection or occlusion. Mild atheromatous plaque about the right bifurcation without significant narrowing. Left carotid system: Left common carotid artery widely patent from its origin to the bifurcation. Bulky eccentric calcified plaque at the origin of the left ICA with no more than mild 25% stenosis. Left ICA widely patent distally to the skull base without stenosis, dissection or occlusion. Vertebral arteries: Both vertebral arteries arise from the subclavian arteries. Right vertebral artery slightly dominant. Vertebral arteries widely patent within the neck without stenosis, dissection or occlusion. Skeleton: No acute osseous finding. No discrete lytic or blastic osseous lesions. Degenerative disc  bulging noted at C6-7 without significant stenosis. Other neck: No other acute soft tissue abnormality within the neck. No mass lesion or adenopathy. Upper chest: Large left with moderate right layering pleural effusions with associated atelectasis partially visualized. Oblique linear densities within the anterior right upper lobe most consistent with atelectasis and/or scarring. Visualized upper chest demonstrates no other acute finding. Review  of the MIP images confirms the above findings CTA HEAD FINDINGS Anterior circulation: Petrous segments widely patent bilaterally. Mild scattered atheromatous plaque within the carotid siphons without significant stenosis. A1 segments, anterior communicating artery common anterior cerebral arteries widely patent. M1 segments widely patent bilaterally. Normal MCA bifurcations. On the left, there is abrupt occlusion of a proximal left M3 branch (series 6, image 463), in keeping with the left MCA territory infarct. Left MCA branches otherwise perfused. Distal right MCA branches well perfused without obstruction. Posterior circulation: Vertebral arteries widely patent to the vertebrobasilar junction without stenosis. Both picas patent. Basilar widely patent to its distal aspect without stenosis. Superior cerebral arteries patent bilaterally. Right PCA supplied via the basilar. Left PCA supplied via a hypoplastic left P1 segment as well as a robust left posterior communicating artery. PCAs widely patent to their distal aspects without stenosis. Venous sinuses: Patent. Calcified left parafalcine meningioma again noted. No invasion of the adjacent superior sagittal sinus. Anatomic variants: None significant. Review of the MIP images confirms the above findings IMPRESSION: 1. Acute proximal left M3 occlusion, consistent with the known left MCA territory infarct. 2. Mild scattered atherosclerotic change elsewhere throughout the major arterial vasculature of the head and neck. No other  hemodynamically significant or correctable stenosis. 3. Large left greater than right layering pleural effusions, partially visualized. Electronically Signed   By: Jeannine Boga M.D.   On: 02/17/2019 22:44     Time spent: 35 minutes   Author: Berle Mull, MD [facility] 02/18/2019 4:23 PM  To reach On-call, see care teams to locate the attending and reach out to them via [email or web address]. If 7PM-7AM, please contact night-coverage If you still have difficulty reaching the attending provider, please page the [facility] (Director on Call) for [facility] on amion for assistance.

## 2019-02-18 NOTE — Consult Note (Signed)
Cardiology Consultation:   Patient ID: Sydney [name] MRN: [id number]; DOB: Nov 02, 1930  Admit date: 02/08/2019 Date of Consult: 02/18/2019  Primary Care Provider: [name], MD Primary Cardiologist: Quay Burow, MD  Primary Electrophysiologist:  None    Patient Profile:   Sydney [name] is a 85 y.o. female with a hx of atrial tachycardia who is being seen today for the evaluation of rapid atrial fibrillation and cardiomyopathy at the request of Dr. Posey Pronto.  History of Present Illness:   Sydney [name] is an 85 year old woman with multiple medical issues.  She last saw Dr. Quay Burow in December 2015.  At that time there was mention of palpitations, TIA history, and atrial tachycardia.  Much of history is obtained from the EMR as the patient is unable to provide any at this time.  I also spoke with Dr. Posey Pronto.  She presented to the hospital on 02/08/2019 with shortness of breath and was found to be hypoxic with radiographic demonstration of large bilateral pleural effusions and possible infiltrate.  There were concerns for aspiration pneumonia as well.  She has a history of C. difficile colitis as well.  On 02/17/2019 she went into rapid atrial fibrillation and was given IV diltiazem and subsequently converted to sinus rhythm.  On the morning of 02/17/2019, she was unable to follow commands and was not responding to painful stimuli and a code stroke was called.  MRI showed acute stroke in left MCA territory.  She is not deemed to be a candidate for aggressive interventions as per neurology.  Given the size of her stroke, neurology recommended against systemic anticoagulation with respect to her atrial fibrillation.  Echocardiogram was performed which shows mild to moderately reduced left ventricular systolic function, LVEF 40 to 45%.    Heart Pathway Score:     Past Medical History:  Diagnosis Date   Anemia    years ago   Aortic atherosclerosis ([place])    Arthritis    "left hand" (02/17/2013)   Bilateral inguinal hernia    INCIDENTAL CT 1/20   C. difficile diarrhea    Cataract    Dehydration 12/29/2018   Diplopia    CRANIAL 6 NERVE PALSY   Dyslipidemia    Gastritis    Glaucoma    Hypertension    Leukopenia    Meningioma ([place]) 05/01/2015   Osteoporosis 02/2014   T score -2.5  followed by Dr. 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 Authorizing Provider  aspirin (ECOTRIN LOW STRENGTH) 81 MG EC tablet Take 81 mg by mouth daily. Swallow whole.   Yes [provider]  cholecalciferol (VITAMIN D) 1000 units tablet Take 1,000 Units  by mouth daily.   Yes [provider]  clopidogrel (PLAVIX) 75 MG tablet Take 75 mg by mouth daily.   Yes [provider]  feeding supplement, ENSURE ENLIVE, (ENSURE ENLIVE) LIQD Take 237 mLs by mouth 2 (two) times daily between meals. 02/13/19  Yes Dhungel, Nishant, MD  folic acid (FOLVITE) 1 MG tablet Take 1 mg by mouth daily.   Yes [provider]  Hydrocortisone (GERHARDT'S BUTT CREAM) CREA Apply 1 application topically 3 (three) times daily. Apply barrier cream to buttocks/sacrum TID and PRN with each incontinent  cleaning session 01/31/19 03/02/19 Yes Ghimire, Henreitta Leber, MD  latanoprost (XALATAN) 0.005 % ophthalmic solution Place 1 drop into both eyes at bedtime.    Yes [provider]  megestrol (MEGACE) 400 MG/10ML suspension Take 10 mLs (400 mg total) by mouth 3 (three) times daily. 01/31/19 03/02/19 Yes Ghimire, Henreitta Leber, MD  mirtazapine (REMERON SOL-TAB) 45 MG disintegrating tablet Take 1 tablet (45 mg total) by mouth at bedtime. 01/31/19  Yes Ghimire, Henreitta Leber, MD  Multiple Vitamin (MULTIVITAMIN WITH MINERALS) TABS tablet Take 1 tablet by mouth daily. 02/01/19  Yes Ghimire, Henreitta Leber, MD  nutrition supplement, JUVEN, (JUVEN) PACK Take 1 packet by mouth 2 (two) times daily between meals. 02/13/19  Yes Dhungel, Nishant, MD  nystatin (MYCOSTATIN/NYSTOP) powder Apply topically 2 (two) times daily. Apply to affected area on the skin folds 01/31/19  Yes Ghimire, Henreitta Leber, MD  acetaminophen (TYLENOL) 650 MG suppository Place 1 suppository (650 mg total) rectally every 6 (six) hours as needed for mild pain (or Fever >/= 101). Patient not taking: Reported on 01/31/2019 02/13/19   Dhungel, Flonnie Overman, MD  Amino Acids-Protein Hydrolys (FEEDING SUPPLEMENT, PRO-STAT SUGAR FREE 64,) LIQD Take 30 mLs by mouth 2 (two) times daily with a meal. Patient not taking: Reported on 02/02/2019 01/31/19 03/02/19  Jonetta Osgood, MD  loperamide (IMODIUM) 2 MG capsule Take 1 capsule (2 mg total) by mouth as needed for diarrhea or loose stools. Patient not taking: Reported on 02/02/2019 01/31/19   Jonetta Osgood, MD  pantoprazole (PROTONIX) 20 MG tablet Take 20 mg by mouth daily. 12/09/18   [provider]  traMADol (ULTRAM) 50 MG tablet Take 0.5 tablets (25 mg total) by mouth every 12 (twelve) hours as needed for moderate pain. 01/31/19   Ghimire, Henreitta Leber, MD    Inpatient Medications: Scheduled Meds:   stroke: mapping our early stages of recovery book   Does not apply Once   aspirin  300 mg Rectal Daily    chlorhexidine  15 mL Mouth Rinse BID   Chlorhexidine Gluconate Cloth  6 each Topical Daily   clopidogrel  75 mg Oral Daily   enoxaparin (LOVENOX) injection  30 mg Subcutaneous Q24H   feeding supplement (ENSURE ENLIVE)  237 mL Oral BID BM   feeding supplement (PRO-STAT SUGAR FREE 64)  30 mL Oral BID WC   latanoprost  1 drop Both Eyes QHS   mouth rinse  15 mL Mouth Rinse q12n4p   megestrol  400 mg Oral BID   mirtazapine  45 mg Oral QHS   multivitamin with minerals  1 tablet Oral Daily   nutrition supplement (JUVEN)  1 packet Oral BID BM   Continuous Infusions:  sodium chloride Stopped (02/17/19 1441)   dextrose 5% lactated ringers 50 mL/hr at 02/18/19 0906   metronidazole Stopped (02/18/19 0229)   PRN Meds: sodium chloride, acetaminophen, LORazepam, traMADol  Allergies:    Allergies  Allergen Reactions  Demerol [Meperidine] Other (See Comments)    Excessive sweating, cramping   Other     No Antibiotic without consultation with her primary physician.   Codeine Rash   Sulfa Antibiotics Itching and Other (See Comments)    Reaction unknown   Tomato Itching    Social History:   Social History   Socioeconomic History   Marital status: Widowed    Spouse name: Not on file   Number of children: 1   Years of education: 57   Highest education level: Not on file  Occupational History   Occupation: retired  Scientist, product/process development strain: Not on file   Food insecurity    Worry: Not on file    Inability: Not on Lexicographer needs    Medical: Not on file    Non-medical: Not on file  Tobacco Use   Smoking status: Never Smoker   Smokeless tobacco: Never Used  Substance and Sexual Activity   Alcohol use: No   Drug use: No   Sexual activity: Not Currently    Comment: 1st intercourse 85 yo-Fewer than 5 partners  Lifestyle   Physical activity    Days per week: Not on file    Minutes per session: Not on file   Stress: Not  on file  Relationships   Social connections    Talks on phone: Not on file    Gets together: Not on file    Attends religious service: Not on file    Active member of club or organization: Not on file    Attends meetings of clubs or organizations: Not on file    Relationship status: Not on file   Intimate partner violence    Fear of current or ex partner: Not on file    Emotionally abused: Not on file    Physically abused: Not on file    Forced sexual activity: Not on file  Other Topics Concern   Not on file  Social History Narrative   Patient does not drink caffeine.   Patient is right handed.     Family History:    Family History  Problem Relation Age of Onset   Hypertension Father    Heart attack Father    Stroke Brother    Stroke Maternal Grandmother      ROS:  Please see the history of present illness.    All other ROS reviewed and negative.     Physical Exam/Data:   Vitals:   02/18/19 0600 02/18/19 0700 02/18/19 0800 02/18/19 0814  BP: (!) 110/49 120/63 (!) 136/56   Pulse: 60 79 73   Resp: 13 16 14    Temp:    98 F (36.7 C)  TempSrc:    Axillary  SpO2: 100%     Weight:      Height:        Intake/Output Summary (Last 24 hours) at 02/18/2019 0915 Last data filed at 02/18/2019 0700 Gross per 24 hour  Intake 1276.85 ml  Output 350 ml  Net 926.85 ml   Last 3 Weights 02/14/2019 02/09/2019 02/08/2019  Weight (lbs) 103 lb 6.3 oz 103 lb 6.4 oz 105 lb  Weight (kg) 46.9 kg 46.902 kg 47.628 kg     Body mass index is 18.32 kg/m.  General: Frail appearing, appears chronically ill, lethargic, not oriented to time place or person HEENT: Conjunctiva appear normal Lymph: no adenopathy Neck: Unable to assess JVD Endocrine:  No thryomegaly Cardiac:  normal S1, S2;  RRR; no murmur  Lungs: Diminished breath sounds Abd: soft, nontender, no hepatomegaly  Ext: no edema Musculoskeletal:  No deformities Skin: warm and dry   EKG:  The EKG was personally  reviewed and demonstrates: ECG on 02/17/2019 demonstrates sinus rhythm with lateral T wave inversions Telemetry:  Telemetry was personally reviewed and demonstrates: Sinus rhythm with short atrial run yesterday evening  Relevant CV Studies:  Echocardiogram 02/17/2019:  1. Left ventricular ejection fraction, by visual estimation, is 40 to 45%. The left ventricle has mildly decreased function. Normal left ventricular size. There is no left ventricular hypertrophy.  2. Left ventricular diastolic function could not be evaluated pattern of LV diastolic filling.  3. Global right ventricle has normal systolic function.The right ventricular size is normal. No increase in right ventricular wall thickness.  4. Left atrial size was normal.  5. Right atrial size was normal.  6. Large pleural effusion in the left lateral region.  7. The mitral valve is normal in structure. Mild to moderate mitral valve regurgitation. No evidence of mitral stenosis.  8. The tricuspid valve is normal in structure. Tricuspid valve regurgitation was not visualized by color flow Doppler.  9. The aortic valve was not well visualized Aortic valve regurgitation is mild by color flow Doppler. Moderate aortic valve sclerosis/calcification without any evidence of aortic stenosis. 10. The pulmonic valve was normal in structure. Pulmonic valve regurgitation is not visualized by color flow Doppler. 11. TR signal is inadequate for assessing pulmonary artery systolic pressure. 12. The inferior vena cava is normal in size with greater than 50% respiratory variability, suggesting right atrial pressure of 3 mmHg.  Laboratory Data:  High Sensitivity Troponin:  No results for input(s): TROPONINIHS in the last 720 hours.   Chemistry Recent Labs  Lab 02/17/19 0519 02/17/19 1318 02/18/19 0222  NA 141 138 139  K 3.2* 3.3* 4.4  CL 109 110 113*  CO2 20* 17* 17*  GLUCOSE 73 148* 118*  BUN 32* 30* 27*  CREATININE 1.20* 1.16* 0.96  CALCIUM  8.4* 8.0* 7.7*  GFRNONAA 41* 43* 54*  GFRAA 47* 49* >60  ANIONGAP 12 11 9     Recent Labs  Lab 02/16/19 0505 02/17/19 0519 02/17/19 1318  PROT 6.3* 5.8* 6.1*  ALBUMIN 2.4* 2.3* 2.4*  AST 31 22 23   ALT 9 7 6   ALKPHOS 102 92 89  BILITOT 1.8* 1.1 1.3*   Hematology Recent Labs  Lab 02/17/19 0519 02/17/19 1318 02/18/19 0222  WBC 11.4* 13.0* 14.2*  RBC 2.83* 2.88* 2.47*  HGB 9.1* 9.3* 8.1*  HCT 28.9* 30.8* 26.0*  MCV 102.1* 106.9* 105.3*  MCH 32.2 32.3 32.8  MCHC 31.5 30.2 31.2  RDW 20.9* 21.6* 21.5*  PLT 152 143* 134*   BNPNo results for input(s): BNP, PROBNP in the last 168 hours.  DDimer No results for input(s): DDIMER in the last 168 hours.   Radiology/Studies:  Ct Angio Head W Or Wo Contrast  Result Date: 02/17/2019 CLINICAL DATA:  Initial evaluation for acute stroke. EXAM: CT ANGIOGRAPHY HEAD AND NECK TECHNIQUE: Multidetector CT imaging of the head and neck was performed using the standard protocol during bolus administration of intravenous contrast. Multiplanar CT image reconstructions and MIPs were obtained to evaluate the vascular anatomy. Carotid stenosis measurements (when applicable) are obtained utilizing NASCET criteria, using the distal internal carotid diameter as the denominator. CONTRAST:  48mL OMNIPAQUE IOHEXOL 350 MG/ML SOLN COMPARISON:  Prior CT and MRI from earlier same day. FINDINGS: CTA NECK FINDINGS Aortic arch: Visualized aortic  arch of normal caliber with normal 3 vessel morphology. Mild atheromatous plaque within the arch and about the origin of the great vessels without hemodynamically significant stenosis. Visualized subclavian arteries widely patent. Right carotid system: Right common and internal carotid arteries widely patent without stenosis, dissection or occlusion. Mild atheromatous plaque about the right bifurcation without significant narrowing. Left carotid system: Left common carotid artery widely patent from its origin to the bifurcation.  Bulky eccentric calcified plaque at the origin of the left ICA with no more than mild 25% stenosis. Left ICA widely patent distally to the skull base without stenosis, dissection or occlusion. Vertebral arteries: Both vertebral arteries arise from the subclavian arteries. Right vertebral artery slightly dominant. Vertebral arteries widely patent within the neck without stenosis, dissection or occlusion. Skeleton: No acute osseous finding. No discrete lytic or blastic osseous lesions. Degenerative disc bulging noted at C6-7 without significant stenosis. Other neck: No other acute soft tissue abnormality within the neck. No mass lesion or adenopathy. Upper chest: Large left with moderate right layering pleural effusions with associated atelectasis partially visualized. Oblique linear densities within the anterior right upper lobe most consistent with atelectasis and/or scarring. Visualized upper chest demonstrates no other acute finding. Review of the MIP images confirms the above findings CTA HEAD FINDINGS Anterior circulation: Petrous segments widely patent bilaterally. Mild scattered atheromatous plaque within the carotid siphons without significant stenosis. A1 segments, anterior communicating artery common anterior cerebral arteries widely patent. M1 segments widely patent bilaterally. Normal MCA bifurcations. On the left, there is abrupt occlusion of a proximal left M3 branch (series 6, image 463), in keeping with the left MCA territory infarct. Left MCA branches otherwise perfused. Distal right MCA branches well perfused without obstruction. Posterior circulation: Vertebral arteries widely patent to the vertebrobasilar junction without stenosis. Both picas patent. Basilar widely patent to its distal aspect without stenosis. Superior cerebral arteries patent bilaterally. Right PCA supplied via the basilar. Left PCA supplied via a hypoplastic left P1 segment as well as a robust left posterior communicating  artery. PCAs widely patent to their distal aspects without stenosis. Venous sinuses: Patent. Calcified left parafalcine meningioma again noted. No invasion of the adjacent superior sagittal sinus. Anatomic variants: None significant. Review of the MIP images confirms the above findings IMPRESSION: 1. Acute proximal left M3 occlusion, consistent with the known left MCA territory infarct. 2. Mild scattered atherosclerotic change elsewhere throughout the major arterial vasculature of the head and neck. No other hemodynamically significant or correctable stenosis. 3. Large left greater than right layering pleural effusions, partially visualized. Electronically Signed   By: Jeannine Boga M.D.   On: 02/17/2019 22:44   Ct Angio Neck W Or Wo Contrast  Result Date: 02/17/2019 CLINICAL DATA:  Initial evaluation for acute stroke. EXAM: CT ANGIOGRAPHY HEAD AND NECK TECHNIQUE: Multidetector CT imaging of the head and neck was performed using the standard protocol during bolus administration of intravenous contrast. Multiplanar CT image reconstructions and MIPs were obtained to evaluate the vascular anatomy. Carotid stenosis measurements (when applicable) are obtained utilizing NASCET criteria, using the distal internal carotid diameter as the denominator. CONTRAST:  15mL OMNIPAQUE IOHEXOL 350 MG/ML SOLN COMPARISON:  Prior CT and MRI from earlier same day. FINDINGS: CTA NECK FINDINGS Aortic arch: Visualized aortic arch of normal caliber with normal 3 vessel morphology. Mild atheromatous plaque within the arch and about the origin of the great vessels without hemodynamically significant stenosis. Visualized subclavian arteries widely patent. Right carotid system: Right common and internal carotid arteries widely patent  without stenosis, dissection or occlusion. Mild atheromatous plaque about the right bifurcation without significant narrowing. Left carotid system: Left common carotid artery widely patent from its  origin to the bifurcation. Bulky eccentric calcified plaque at the origin of the left ICA with no more than mild 25% stenosis. Left ICA widely patent distally to the skull base without stenosis, dissection or occlusion. Vertebral arteries: Both vertebral arteries arise from the subclavian arteries. Right vertebral artery slightly dominant. Vertebral arteries widely patent within the neck without stenosis, dissection or occlusion. Skeleton: No acute osseous finding. No discrete lytic or blastic osseous lesions. Degenerative disc bulging noted at C6-7 without significant stenosis. Other neck: No other acute soft tissue abnormality within the neck. No mass lesion or adenopathy. Upper chest: Large left with moderate right layering pleural effusions with associated atelectasis partially visualized. Oblique linear densities within the anterior right upper lobe most consistent with atelectasis and/or scarring. Visualized upper chest demonstrates no other acute finding. Review of the MIP images confirms the above findings CTA HEAD FINDINGS Anterior circulation: Petrous segments widely patent bilaterally. Mild scattered atheromatous plaque within the carotid siphons without significant stenosis. A1 segments, anterior communicating artery common anterior cerebral arteries widely patent. M1 segments widely patent bilaterally. Normal MCA bifurcations. On the left, there is abrupt occlusion of a proximal left M3 branch (series 6, image 463), in keeping with the left MCA territory infarct. Left MCA branches otherwise perfused. Distal right MCA branches well perfused without obstruction. Posterior circulation: Vertebral arteries widely patent to the vertebrobasilar junction without stenosis. Both picas patent. Basilar widely patent to its distal aspect without stenosis. Superior cerebral arteries patent bilaterally. Right PCA supplied via the basilar. Left PCA supplied via a hypoplastic left P1 segment as well as a robust left  posterior communicating artery. PCAs widely patent to their distal aspects without stenosis. Venous sinuses: Patent. Calcified left parafalcine meningioma again noted. No invasion of the adjacent superior sagittal sinus. Anatomic variants: None significant. Review of the MIP images confirms the above findings IMPRESSION: 1. Acute proximal left M3 occlusion, consistent with the known left MCA territory infarct. 2. Mild scattered atherosclerotic change elsewhere throughout the major arterial vasculature of the head and neck. No other hemodynamically significant or correctable stenosis. 3. Large left greater than right layering pleural effusions, partially visualized. Electronically Signed   By: Jeannine Boga M.D.   On: 02/17/2019 22:44   Ct Chest Wo Contrast  Result Date: 02/11/2019 CLINICAL DATA:  Hypoxia, pleural effusion. EXAM: CT CHEST WITHOUT CONTRAST TECHNIQUE: Multidetector CT imaging of the chest was performed following the standard protocol without IV contrast. COMPARISON:  Radiograph of same day. FINDINGS: Cardiovascular: Atherosclerosis of thoracic aorta is noted without aneurysm formation. Normal cardiac size. No pericardial effusion. Mediastinum/Nodes: No enlarged mediastinal or axillary lymph nodes. Thyroid gland, trachea, and esophagus demonstrate no significant findings. Lungs/Pleura: No pneumothorax is noted. Large bilateral pleural effusions are noted, left greater than right. There is complete atelectasis of the left lower lobe with a large amount of abnormal soft tissue density seen in the left mainstem bronchus and lower lobe bronchi. This is concerning for aspiration or mucous plugging. Bronchoscopy is recommended. Right upper lobe interstitial opacity is noted concerning for focal inflammation or atelectasis. Upper Abdomen: No acute abnormality. Musculoskeletal: No chest wall mass or suspicious bone lesions identified. IMPRESSION: Large bilateral pleural effusions are noted, left  greater than right. Complete atelectasis of left lower lobe is noted with a large amount of abnormal soft tissue density seen in the left  mainstem bronchus and lower lobe bronchi, concerning for aspirated material or mucous plugging. Bronchoscopy is recommended for further evaluation. Mild interstitial density is noted in right upper lobe concerning for focal inflammation or atelectasis. Aortic Atherosclerosis (ICD10-I70.0). Electronically Signed   By: Marijo Conception M.D.   On: 02/11/2019 15:10   Mr Brain Wo Contrast  Addendum Date: 02/17/2019   ADDENDUM REPORT: 02/17/2019 16:31 ADDENDUM: Redemonstrated 11 mm left parafalcine partially calcified dural-based mass overlying the left frontal lobe, consistent with small incidental meningioma. Electronically Signed   By: Kellie Simmering DO   On: 02/17/2019 16:31   Result Date: 02/17/2019 CLINICAL DATA:  Encephalopathy. Additional history provided: 85 year old female presents with altered mental status, left arm tremor. EXAM: MRI HEAD WITHOUT CONTRAST TECHNIQUE: Multiplanar, multiecho pulse sequences of the brain and surrounding structures were obtained without intravenous contrast. COMPARISON:  Noncontrast head CT 04/19/2019, brain MRI 12/21/2018 FINDINGS: Brain: Multiple sequences are motion degraded, limiting evaluation. There is a moderate-size region of cortical/subcortical restricted diffusion centered within the left frontal operculum also involving portions of the left insula and left temporal lobe. Findings are consistent with acute left MCA vascular territory infarct. No evidence of intracranial mass. No midline shift or extra-axial fluid collection. No chronic intracranial blood products. Mild scattered and confluent T2/FLAIR hyperintensity within the cerebral white matter is nonspecific, but consistent with chronic small vessel ischemic disease. Moderate generalized parenchymal atrophy. Vascular: Flow voids maintained within the proximal large arterial  vessels. Skull and upper cervical spine: No focal marrow lesion. Sinuses/Orbits: Visualized orbits demonstrate no acute abnormality. Small left sphenoid sinus air-fluid level. Bilateral mastoid effusions (greater on the right). These results were called by telephone at the time of interpretation on 02/17/2019 at 3:59 pm to provider [facility] PATEL , who verbally acknowledged these results. IMPRESSION: 1. Motion degraded examination 2. Findings consistent with acute left MCA vascular territory infarct. 3. Generalized parenchymal atrophy and chronic small vessel ischemic disease. 4. Bilateral mastoid effusions. Electronically Signed: By: Kellie Simmering DO On: 02/17/2019 16:05   [place] Chest (pleural Effusion)  Result Date: 02/16/2019 CLINICAL DATA:  Pleural effusions. EXAM: CHEST ULTRASOUND COMPARISON:  CT of the chest on 02/04/2019 FINDINGS: The patient was brought down for a planned left thoracentesis. Ultrasound demonstrates a moderate left pleural effusion. While preparing to perform the procedure, the patient became combative, agitated and refused to have the procedure performed. It was not safe to proceed with attempted thoracentesis today. IMPRESSION: Aborted left thoracentesis procedure due to lack of patient cooperation and refusal to have the procedure performed. Electronically Signed   By: Aletta Edouard M.D.   On: 02/16/2019 12:55   Dg Chest Port 1 View  Result Date: 02/17/2019 CLINICAL DATA:  Hypoglycemia.  Hypotension.  Shortness of breath. EXAM: PORTABLE CHEST 1 VIEW COMPARISON:  Chest x-ray 02/14/2019. FINDINGS: Mediastinum hilar structures normal. Heart size normal. Bilateral pleural effusions, left side greater than right. Underlying pulmonary infiltrates particularly on the left cannot be excluded. Cavitation left lung base cannot be excluded. Bibasilar atelectasis. No pneumothorax. Degenerative change and scoliosis thoracic spine. IMPRESSION: 1. Bilateral pleural effusions, left side greater than  right. Underlying pulmonary infiltrates particularly on the left cannot be excluded. Cavitation in the left lung base cannot be excluded. 2.  Bibasilar atelectasis. Electronically Signed   By: Marcello Moores  Register   On: 02/17/2019 13:49   Dg Chest Portable 1 View  Result Date: 02/07/2019 CLINICAL DATA:  Shortness of breath, weakness and low oxygen saturation. EXAM: PORTABLE CHEST 1 VIEW COMPARISON:  Chest x-ray 02/07/2019 FINDINGS: The heart is mildly enlarged but stable. Stable tortuosity and calcification of the thoracic aorta. Persistent vague right upper lobe opacity but the rest of the right lung demonstrates improved aeration when compared to prior studies. There is diffuse hazy opacity of the left lung which could be due to progressive left lower lobe atelectasis and left pleural effusion. The bony structures are unremarkable. IMPRESSION: 1. Persistent vague right upper lobe lung opacity, likely persistent infiltrate. 2. Loss of volume in the left hemithorax possibly due to progressive left lower lobe atelectasis and/or pleural effusion. CT may be helpful for further evaluation. Electronically Signed   By: Marijo Sanes M.D.   On: 02/20/2019 12:30   Ct Head Code Stroke Wo Contrast`  Result Date: 02/17/2019 CLINICAL DATA:  Code stroke. Altered level of consciousness. Unresponsive with left-sided droop and facial asymmetry EXAM: CT HEAD WITHOUT CONTRAST TECHNIQUE: Contiguous axial images were obtained from the base of the skull through the vertex without intravenous contrast. COMPARISON:  Brain MRI 12/21/2018 FINDINGS: Brain: No evidence of acute infarction, hemorrhage, hydrocephalus, extra-axial collection or mass effect. Generalized atrophy with notable progression since 2014. Chronic small vessel ischemia in the cerebral white matter. Calcified left paramedian upper falcine mass measuring 11 mm, consistent with incidental meningioma. This mass has developed since 2014. Vascular: No hyperdense vessel.   Atherosclerotic calcification Skull: Negative Sinuses/Orbits: Right mastoid and left sphenoid fluid levels with negative nasopharynx. Other: These results were communicated to Dr Posey Pronto at 11:26 amon 10/23/2020by text page via the [facility] messaging system. ASPECTS [facility] Stroke Program Early CT Score) - Ganglionic level infarction (caudate, lentiform nuclei, internal capsule, insula, M1-M3 cortex): 7 - Supraganglionic infarction (M4-M6 cortex): 3 Total score (0-10 with 10 being normal): 10 IMPRESSION: 1. No acute finding.ASPECTS is 10. 2. Atrophy with notable progression from 2014. In the levin mm calcified falcine meningioma has also developed since that time. Electronically Signed   By: Monte Fantasia M.D.   On: 02/17/2019 11:28    Assessment and Plan:   1.  Rapid atrial fibrillation: She converted to sinus rhythm with IV diltiazem.  LVEF 40 to 45% so calcium channel blockers will have to be avoided.  Given size of CVA neurology has recommended against systemic anticoagulation.  Her hemoglobin is also 8.1.  She is on rectal aspirin.  If she has recurrence of atrial fibrillation I would recommend IV amiodarone.  Blood pressure is normal today.  I would use IV Lopressor as needed.  2.  Cardiomyopathy: LVEF 40 to 45% echocardiogram on 02/17/2019, previously with normal LV systolic function on [id number].  Given the extent of her comorbidities I would not recommend an ischemic evaluation.  She is on rectal aspirin.  3.  Acute CVA: MRI on 02/17/2019 indicative of acute left MCA infarct with generalized parenchymal atrophy and chronic small vessel ischemic disease.  Currently on rectal aspirin.  This does not appear to have been caused by atrial fibrillation.  Disposition: I would recommend conservative measures with no further cardiac work-up planned at this time.  [facility] will sign off.   Medication Recommendations:  As above Other recommendations (labs, testing, etc):  None Follow up as an  outpatient:  As needed  For questions or updates, please contact [place] Please consult [email or web address] for contact info under     Signed, Kate Sable, MD  02/18/2019 9:15 AM

## 2019-02-19 ENCOUNTER — Inpatient Hospital Stay (HOSPITAL_COMMUNITY): Payer: Medicare Other

## 2019-02-19 DIAGNOSIS — J90 Pleural effusion, not elsewhere classified: Secondary | ICD-10-CM | POA: Diagnosis not present

## 2019-02-19 DIAGNOSIS — I63512 Cerebral infarction due to unspecified occlusion or stenosis of left middle cerebral artery: Secondary | ICD-10-CM | POA: Diagnosis not present

## 2019-02-19 LAB — BASIC METABOLIC PANEL
Anion gap: 6 (ref 5–15)
BUN: 23 mg/dL (ref 8–23)
CO2: 20 mmol/L — ABNORMAL LOW (ref 22–32)
Calcium: 8 mg/dL — ABNORMAL LOW (ref 8.9–10.3)
Chloride: 117 mmol/L — ABNORMAL HIGH (ref 98–111)
Creatinine, Ser: 1.07 mg/dL — ABNORMAL HIGH (ref 0.44–1.00)
GFR calc Af Amer: 54 mL/min — ABNORMAL LOW (ref >60)
GFR calc non Af Amer: 47 mL/min — ABNORMAL LOW (ref >60)
Glucose, Bld: 118 mg/dL — ABNORMAL HIGH (ref 70–99)
Potassium: 4.7 mmol/L (ref 3.5–5.1)
Sodium: 143 mmol/L (ref 135–145)

## 2019-02-19 LAB — CBC
HCT: 28 % — ABNORMAL LOW (ref 36.0–46.0)
Hemoglobin: 8.6 g/dL — ABNORMAL LOW (ref 12.0–15.0)
MCH: 32.6 pg (ref 26.0–34.0)
MCHC: 30.7 g/dL (ref 30.0–36.0)
MCV: 106.1 fL — ABNORMAL HIGH (ref 80.0–100.0)
Platelets: 131 10*3/uL — ABNORMAL LOW (ref 150–400)
RBC: 2.64 MIL/uL — ABNORMAL LOW (ref 3.87–5.11)
RDW: 22.1 % — ABNORMAL HIGH (ref 11.5–15.5)
WBC: 12.3 10*3/uL — ABNORMAL HIGH (ref 4.0–10.5)
nRBC: 0.2 % (ref 0.0–0.2)

## 2019-02-19 LAB — URINE CULTURE: Culture: >=100000 — AB

## 2019-02-19 LAB — GLUCOSE, CAPILLARY
Glucose-Capillary: 120 mg/dL — ABNORMAL HIGH (ref 70–99)
Glucose-Capillary: 106 mg/dL — ABNORMAL HIGH (ref 70–99)
Glucose-Capillary: 102 mg/dL — ABNORMAL HIGH (ref 70–99)

## 2019-02-19 MED ORDER — LEVALBUTEROL HCL 0.63 MG/3ML IN NEBU
0.6300 mg | INHALATION_SOLUTION | Freq: Four times a day (QID) | RESPIRATORY_TRACT | Status: DC
  Administered 2019-02-19 (×2): 0.63 mg via RESPIRATORY_TRACT
  Filled 2019-02-19 (×2): qty 3

## 2019-02-19 MED ORDER — PIPERACILLIN-TAZOBACTAM 3.375 G IVPB
3.3750 g | Freq: Three times a day (TID) | INTRAVENOUS | Status: DC
  Administered 2019-02-20 (×2): 3.375 g via INTRAVENOUS
  Filled 2019-02-19 (×4): qty 50

## 2019-02-19 MED ORDER — FUROSEMIDE 10 MG/ML IJ SOLN
40.0000 mg | Freq: Once | INTRAMUSCULAR | Status: AC
  Administered 2019-02-19: 40 mg via INTRAVENOUS
  Filled 2019-02-19: qty 4

## 2019-02-19 MED ORDER — PIPERACILLIN-TAZOBACTAM 3.375 G IVPB 30 MIN
3.3750 g | Freq: Once | INTRAVENOUS | Status: AC
  Administered 2019-02-19: 3.375 g via INTRAVENOUS
  Filled 2019-02-19: qty 50

## 2019-02-19 MED ORDER — FUROSEMIDE 10 MG/ML IJ SOLN
20.0000 mg | Freq: Once | INTRAMUSCULAR | Status: AC
  Administered 2019-02-19: 20 mg via INTRAVENOUS
  Filled 2019-02-19: qty 2

## 2019-02-19 MED ORDER — LIDOCAINE VISCOUS HCL 2 % MT SOLN
OROMUCOSAL | Status: AC
  Administered 2019-02-19: 19:00:00
  Filled 2019-02-19: qty 15

## 2019-02-19 MED ORDER — ALBUMIN HUMAN 25 % IV SOLN
INTRAVENOUS | Status: AC
  Filled 2019-02-19: qty 50

## 2019-02-19 MED ORDER — OSMOLITE 1.2 CAL PO LIQD
1000.0000 mL | ORAL | Status: DC
  Administered 2019-02-20: 1000 mL
  Filled 2019-02-19 (×4): qty 1000

## 2019-02-19 MED ORDER — METOPROLOL TARTRATE 5 MG/5ML IV SOLN
5.0000 mg | INTRAVENOUS | Status: AC | PRN
  Administered 2019-02-19 – 2019-02-21 (×2): 5 mg via INTRAVENOUS
  Filled 2019-02-19 (×2): qty 5

## 2019-02-19 MED ORDER — ALBUMIN HUMAN 25 % IV SOLN
25.0000 g | Freq: Once | INTRAVENOUS | Status: AC
  Administered 2019-02-19: 25 g via INTRAVENOUS
  Filled 2019-02-19: qty 50

## 2019-02-19 NOTE — Progress Notes (Signed)
Patient to transfer to [facility] [phone].  Report given to receiving nurse Jana Half RN, all questions answered at this time.  Pt. VSS with no s/s of distress noted at this time.

## 2019-02-19 NOTE — Consult Note (Signed)
Reason for Consult: Stroke/altered mental status Referring Physician: Dr. Elesa Massed Sydney [name] is an 85 y.o. female.  HPI: Patient was transferred from [facility] hospital. She has a complicated history and a long history of failure to thrive.  On 10/21 she was admitted there for hypoxia and found to have bilateral pleural effusions and infiltrate.  Thoracentesis could not be performed due to intolerance and she was deemed not to be a good candidate for bronchoscopy.  On 10/23 she was found to be atrial fibrillation and started on cardizem gtt.  Shortly thereafter she became unresponsive and glucose was in the 50's and ?seizure was suspected.  EEG showed moderate to severe generalized slowing and no seizures or epileptiform discharges.  Subsequent MRI Brain showed an ischemic infarct in the left insula, frontal operculum, and part of temporal lobe.  It also shows severe generalized atrophy and mild periventricular small vessel ischemic disease. TTE showed EF 40-45%.  She converted to NSR on her own.   I was called about anticoagulation and I recommended against it in the acute setting and said to defer to about 2 weeks from now if she has no hemorrhagic transformation on her own.  She has had a sacral decubitus ulcer as well.  CXR today showed worsening pneumonia and pleural effusions.  CTA Brain and Neck are normal except for left M3 occlusion.  There is a small left frontal meningioma without significant mass effect.  Labs show A1c 5, LDL 79, CMP negative, WBC 12k, Hg 8, MCV 106, Plts 131.    Past Medical History:  Diagnosis Date  . Anemia    years ago  . Aortic atherosclerosis ([place])   . Arthritis    "left hand" (02/17/2013)  . Bilateral inguinal hernia    INCIDENTAL CT 1/20  . C. difficile diarrhea   . Cataract   . Dehydration 12/29/2018  . Diplopia    CRANIAL 6 NERVE PALSY  . Dyslipidemia   . Gastritis   . Glaucoma   . Hypertension   . Leukopenia   . Meningioma ([place]) 05/01/2015  .  Osteoporosis 02/2014   T score -2.5  followed by Dr. Lysle Rubens  . Palpitations    PACs, PVCs and short runs of atrial tachycardia on Holter monitoring  . PVD (peripheral vascular disease) ([place])   . Rhinitis   . Syncopal episodes 2003  . TIA (transient ischemic attack) 1990's  . Trigger thumb of left hand     Past Surgical History:  Procedure Laterality Date  . ANAL FISTULECTOMY  1970  . COLONOSCOPY    . COLONOSCOPY WITH PROPOFOL N/A 12/02/2016   Procedure: COLONOSCOPY WITH PROPOFOL;  Surgeon: Gatha Mayer, MD;  Location: [facility] ENDOSCOPY;  Service: Endoscopy;  Laterality: N/A;  . 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 TONSILLECTOMY      Family History  Problem Relation Age of Onset  . Hypertension Father   . Heart attack Father   . Stroke Brother   . Stroke Maternal Grandmother     Social History:  reports that she has never smoked. She has never used smokeless tobacco. She reports that she does not drink alcohol or use drugs.  Allergies:  Allergies  Allergen Reactions  . Demerol [Meperidine] Other (See Comments)    Excessive sweating, cramping  . Other     No Antibiotic without consultation with her primary physician.  Mar[place] Kitchen  Codeine Rash  . Sulfa Antibiotics Itching and Other (See Comments)    Reaction unknown  . Tomato Itching    Prior to Admission medications   Medication Sig Start Date End Date Taking? Authorizing Provider  aspirin (ECOTRIN LOW STRENGTH) 81 MG EC tablet Take 81 mg by mouth daily. Swallow whole.   Yes [provider]  cholecalciferol (VITAMIN D) 1000 units tablet Take 1,000 Units by mouth daily.   Yes [provider]  clopidogrel (PLAVIX) 75 MG tablet Take 75 mg by mouth daily.   Yes [provider]  feeding supplement, ENSURE ENLIVE, (ENSURE ENLIVE) LIQD Take 237 mLs by mouth 2 (two) times daily between meals.  02/13/19  Yes Dhungel, Nishant, MD  folic acid (FOLVITE) 1 MG tablet Take 1 mg by mouth daily.   Yes [provider]  Hydrocortisone (GERHARDT'S BUTT CREAM) CREA Apply 1 application topically 3 (three) times daily. Apply barrier cream to buttocks/sacrum TID and PRN with each incontinent cleaning session 01/31/19 03/02/19 Yes Ghimire, Henreitta Leber, MD  latanoprost (XALATAN) 0.005 % ophthalmic solution Place 1 drop into both eyes at bedtime.    Yes [provider]  megestrol (MEGACE) 400 MG/10ML suspension Take 10 mLs (400 mg total) by mouth 3 (three) times daily. 01/31/19 03/02/19 Yes Ghimire, Henreitta Leber, MD  mirtazapine (REMERON SOL-TAB) 45 MG disintegrating tablet Take 1 tablet (45 mg total) by mouth at bedtime. 01/31/19  Yes Ghimire, Henreitta Leber, MD  Multiple Vitamin (MULTIVITAMIN WITH MINERALS) TABS tablet Take 1 tablet by mouth daily. 02/01/19  Yes Ghimire, Henreitta Leber, MD  nutrition supplement, JUVEN, (JUVEN) PACK Take 1 packet by mouth 2 (two) times daily between meals. 02/13/19  Yes Dhungel, Nishant, MD  nystatin (MYCOSTATIN/NYSTOP) powder Apply topically 2 (two) times daily. Apply to affected area on the skin folds 01/31/19  Yes Ghimire, Henreitta Leber, MD  acetaminophen (TYLENOL) 650 MG suppository Place 1 suppository (650 mg total) rectally every 6 (six) hours as needed for mild pain (or Fever >/= 101). Patient not taking: Reported on 02/08/2019 02/13/19   Dhungel, Flonnie Overman, MD  Amino Acids-Protein Hydrolys (FEEDING SUPPLEMENT, PRO-STAT SUGAR FREE 64,) LIQD Take 30 mLs by mouth 2 (two) times daily with a meal. Patient not taking: Reported on 02/02/2019 01/31/19 03/02/19  Jonetta Osgood, MD  loperamide (IMODIUM) 2 MG capsule Take 1 capsule (2 mg total) by mouth as needed for diarrhea or loose stools. Patient not taking: Reported on 01/27/2019 01/31/19   Jonetta Osgood, MD  pantoprazole (PROTONIX) 20 MG tablet Take 20 mg by mouth daily. 12/09/18   [provider]  traMADol (ULTRAM)  50 MG tablet Take 0.5 tablets (25 mg total) by mouth every 12 (twelve) hours as needed for moderate pain. 01/31/19   Ghimire, Henreitta Leber, MD    Medications:  Scheduled: .  stroke: mapping our early stages of recovery book   Does not apply Once  . aspirin  300 mg Rectal Daily  . chlorhexidine  15 mL Mouth Rinse BID  . Chlorhexidine Gluconate Cloth  6 each Topical Daily  . enoxaparin (LOVENOX) injection  30 mg Subcutaneous Q24H  . latanoprost  1 drop Both Eyes QHS  . levalbuterol  0.63 mg Nebulization Q6H  . mouth rinse  15 mL Mouth Rinse q12n4p    Results for orders placed or performed during the hospital encounter of 02/17/2019 (from the past 48 hour(s))  Glucose, capillary     Status: None   Collection Time: 02/17/19  7:43 PM  Result  Value Ref Range   Glucose-Capillary 87 70 - 99 mg/dL   Comment 1 Notify RN    Comment 2 Document in Chart   Glucose, capillary     Status: Abnormal   Collection Time: 02/17/19 11:17 PM  Result Value Ref Range   Glucose-Capillary 107 (H) 70 - 99 mg/dL   Comment 1 Notify RN    Comment 2 Document in Chart   Hemoglobin A1c     Status: None   Collection Time: 02/18/19  2:22 AM  Result Value Ref Range   Hgb A1c MFr Bld 5.0 4.8 - 5.6 %    Comment: (NOTE) Pre diabetes:          5.7%-6.4% Diabetes:              >6.4% Glycemic control for   <7.0% adults with diabetes    Mean Plasma Glucose 96.8 mg/dL    Comment: Performed at [facility], [place] [address], [place], [place] [address]  Lipid panel     Status: Abnormal   Collection Time: 02/18/19  2:22 AM  Result Value Ref Range   Cholesterol 124 0 - 200 mg/dL   Triglycerides 123 <150 mg/dL   HDL 20 (L) >40 mg/dL   Total CHOL/HDL Ratio 6.2 RATIO   VLDL 25 0 - 40 mg/dL   LDL Cholesterol 79 0 - 99 mg/dL    Comment:        Total Cholesterol/HDL:CHD Risk Coronary Heart Disease Risk Table                     Men   Women  1/2 Average Risk   3.4   3.3  Average Risk       5.0   4.4  2 X Average Risk    9.6   7.1  3 X Average Risk  23.4   11.0        Use the calculated Patient Ratio above and the CHD Risk Table to determine the patient's CHD Risk.        ATP III CLASSIFICATION (LDL):  <100     mg/dL   Optimal  100-129  mg/dL   Near or Above                    Optimal  130-159  mg/dL   Borderline  160-189  mg/dL   High  >190     mg/dL   Very High Performed at [facility] [address] [place], [place] [address]   CBC     Status: Abnormal   Collection Time: 02/18/19  2:22 AM  Result Value Ref Range   WBC 14.2 (H) 4.0 - 10.5 K/uL   RBC 2.47 (L) 3.87 - 5.11 MIL/uL   Hemoglobin 8.1 (L) 12.0 - 15.0 g/dL   HCT 26.0 (L) 36.0 - 46.0 %   MCV 105.3 (H) 80.0 - 100.0 fL   MCH 32.8 26.0 - 34.0 pg   MCHC 31.2 30.0 - 36.0 g/dL   RDW 21.5 (H) 11.5 - 15.5 %   Platelets 134 (L) 150 - 400 K/uL   nRBC 0.2 0.0 - 0.2 %    Comment: Performed at [facility], [place] [address] [place], [place] [id number]  Basic metabolic panel     Status: Abnormal   Collection Time: 02/18/19  2:22 AM  Result Value Ref Range   Sodium 139 135 - 145 mmol/L   Potassium 4.4 3.5 -  5.1 mmol/L    Comment: DELTA CHECK NOTED NO VISIBLE HEMOLYSIS    Chloride 113 (H) 98 - 111 mmol/L   CO2 17 (L) 22 - 32 mmol/L   Glucose, Bld 118 (H) 70 - 99 mg/dL   BUN 27 (H) 8 - 23 mg/dL   Creatinine, Ser 0.96 0.44 - 1.00 mg/dL   Calcium 7.7 (L) 8.9 - 10.3 mg/dL   GFR calc non Af Amer 54 (L) >60 mL/min   GFR calc Af Amer >60 >60 mL/min   Anion gap 9 5 - 15    Comment: Performed at [facility], [place] [address] [place], [place] [address]  Magnesium     Status: None   Collection Time: 02/18/19  2:22 AM  Result Value Ref Range   Magnesium 1.8 1.7 - 2.4 mg/dL    Comment: Performed at [facility], [place] [address], [place], [place] [address]  Lactic acid, plasma     Status: Abnormal   Collection Time: 02/18/19  2:22 AM  Result Value Ref Range   Lactic Acid,  Venous 2.0 (HH) 0.5 - 1.9 mmol/L    Comment: CRITICAL VALUE NOTED.  VALUE IS CONSISTENT WITH PREVIOUSLY REPORTED AND CALLED VALUE. Performed at [facility], [place] [address], [place], [place] [address]   Protime-INR     Status: Abnormal   Collection Time: 02/18/19  2:22 AM  Result Value Ref Range   Prothrombin Time 16.5 (H) 11.4 - 15.2 seconds   INR 1.4 (H) 0.8 - 1.2    Comment: (NOTE) INR goal varies based on device and disease states. Performed at [facility], [place] [address] [place], [place] [address]   TSH     Status: None   Collection Time: 02/18/19  2:22 AM  Result Value Ref Range   TSH 1.333 0.350 - 4.500 uIU/mL    Comment: Performed by a 3rd Generation assay with a functional sensitivity of <=0.01 uIU/mL. Performed at [facility], [place] [address] [place], [name] [address]   T4, free     Status: Abnormal   Collection Time: 02/18/19  2:22 AM  Result Value Ref Range   Free T4 1.15 (H) 0.61 - 1.12 ng/dL    Comment: (NOTE) Biotin ingestion may interfere with free T4 tests. If the results are inconsistent with the TSH level, previous test results, or the clinical presentation, then consider biotin interference. If needed, order repeat testing after stopping biotin. Performed at [facility], [place] [address], [place], [place] [address]   Glucose, capillary     Status: None   Collection Time: 02/18/19  3:30 AM  Result Value Ref Range   Glucose-Capillary 90 70 - 99 mg/dL   Comment 1 Notify RN    Comment 2 Document in Chart   Glucose, capillary     Status: Abnormal   Collection Time: 02/18/19  7:39 AM  Result Value Ref Range   Glucose-Capillary 115 (H) 70 - 99 mg/dL   Comment 1 Notify RN    Comment 2 Document in Chart   Glucose, capillary     Status: Abnormal   Collection Time: 02/18/19 12:20 PM  Result Value Ref Range   Glucose-Capillary 101 (H) 70 - 99 mg/dL  Glucose, capillary     Status: Abnormal    Collection Time: 02/18/19  4:19 PM  Result Value Ref Range   Glucose-Capillary 112 (H) 70 - 99 mg/dL  Glucose, capillary     Status:  Abnormal   Collection Time: 02/18/19  7:35 PM  Result Value Ref Range   Glucose-Capillary 115 (H) 70 - 99 mg/dL  Glucose, capillary     Status: None   Collection Time: 02/18/19 11:20 PM  Result Value Ref Range   Glucose-Capillary 96 70 - 99 mg/dL  CBC     Status: Abnormal   Collection Time: 02/19/19  2:34 AM  Result Value Ref Range   WBC 12.3 (H) 4.0 - 10.5 K/uL   RBC 2.64 (L) 3.87 - 5.11 MIL/uL   Hemoglobin 8.6 (L) 12.0 - 15.0 g/dL   HCT 28.0 (L) 36.0 - 46.0 %   MCV 106.1 (H) 80.0 - 100.0 fL   MCH 32.6 26.0 - 34.0 pg   MCHC 30.7 30.0 - 36.0 g/dL   RDW 22.1 (H) 11.5 - 15.5 %   Platelets 131 (L) 150 - 400 K/uL   nRBC 0.2 0.0 - 0.2 %    Comment: Performed at [facility], [place] [address], [place], [place] [id number]  Basic metabolic panel     Status: Abnormal   Collection Time: 02/19/19  2:34 AM  Result Value Ref Range   Sodium 143 135 - 145 mmol/L   Potassium 4.7 3.5 - 5.1 mmol/L   Chloride 117 (H) 98 - 111 mmol/L   CO2 20 (L) 22 - 32 mmol/L   Glucose, Bld 118 (H) 70 - 99 mg/dL   BUN 23 8 - 23 mg/dL   Creatinine, Ser 1.07 (H) 0.44 - 1.00 mg/dL   Calcium 8.0 (L) 8.9 - 10.3 mg/dL   GFR calc non Af Amer 47 (L) >60 mL/min   GFR calc Af Amer 54 (L) >60 mL/min   Anion gap 6 5 - 15    Comment: Performed at [facility], [place] [address] [place], [place] [address]  Glucose, capillary     Status: Abnormal   Collection Time: 02/19/19  4:06 AM  Result Value Ref Range   Glucose-Capillary 120 (H) 70 - 99 mg/dL  Glucose, capillary     Status: Abnormal   Collection Time: 02/19/19  7:37 AM  Result Value Ref Range   Glucose-Capillary 106 (H) 70 - 99 mg/dL   Comment 1 Notify RN    Comment 2 Document in Chart   Glucose, capillary     Status: Abnormal   Collection Time: 02/19/19 11:49 AM  Result Value Ref Range    Glucose-Capillary 102 (H) 70 - 99 mg/dL    Ct Angio Head W Or Wo Contrast  Result Date: 02/17/2019 CLINICAL DATA:  Initial evaluation for acute stroke. EXAM: CT ANGIOGRAPHY HEAD AND NECK TECHNIQUE: Multidetector CT imaging of the head and neck was performed using the standard protocol during bolus administration of intravenous contrast. Multiplanar CT image reconstructions and MIPs were obtained to evaluate the vascular anatomy. Carotid stenosis measurements (when applicable) are obtained utilizing NASCET criteria, using the distal internal carotid diameter as the denominator. CONTRAST:  71mL OMNIPAQUE IOHEXOL 350 MG/ML SOLN COMPARISON:  Prior CT and MRI from earlier same day. FINDINGS: CTA NECK FINDINGS Aortic arch: Visualized aortic arch of normal caliber with normal 3 vessel morphology. Mild atheromatous plaque within the arch and about the origin of the great vessels without hemodynamically significant stenosis. Visualized subclavian arteries widely patent. Right carotid system: Right common and internal carotid arteries widely patent without stenosis, dissection or occlusion. Mild atheromatous plaque about the right bifurcation without significant narrowing. Left carotid system: Left common carotid artery widely patent from  its origin to the bifurcation. Bulky eccentric calcified plaque at the origin of the left ICA with no more than mild 25% stenosis. Left ICA widely patent distally to the skull base without stenosis, dissection or occlusion. Vertebral arteries: Both vertebral arteries arise from the subclavian arteries. Right vertebral artery slightly dominant. Vertebral arteries widely patent within the neck without stenosis, dissection or occlusion. Skeleton: No acute osseous finding. No discrete lytic or blastic osseous lesions. Degenerative disc bulging noted at C6-7 without significant stenosis. Other neck: No other acute soft tissue abnormality within the neck. No mass lesion or adenopathy. Upper  chest: Large left with moderate right layering pleural effusions with associated atelectasis partially visualized. Oblique linear densities within the anterior right upper lobe most consistent with atelectasis and/or scarring. Visualized upper chest demonstrates no other acute finding. Review of the MIP images confirms the above findings CTA HEAD FINDINGS Anterior circulation: Petrous segments widely patent bilaterally. Mild scattered atheromatous plaque within the carotid siphons without significant stenosis. A1 segments, anterior communicating artery common anterior cerebral arteries widely patent. M1 segments widely patent bilaterally. Normal MCA bifurcations. On the left, there is abrupt occlusion of a proximal left M3 branch (series 6, image 463), in keeping with the left MCA territory infarct. Left MCA branches otherwise perfused. Distal right MCA branches well perfused without obstruction. Posterior circulation: Vertebral arteries widely patent to the vertebrobasilar junction without stenosis. Both picas patent. Basilar widely patent to its distal aspect without stenosis. Superior cerebral arteries patent bilaterally. Right PCA supplied via the basilar. Left PCA supplied via a hypoplastic left P1 segment as well as a robust left posterior communicating artery. PCAs widely patent to their distal aspects without stenosis. Venous sinuses: Patent. Calcified left parafalcine meningioma again noted. No invasion of the adjacent superior sagittal sinus. Anatomic variants: None significant. Review of the MIP images confirms the above findings IMPRESSION: 1. Acute proximal left M3 occlusion, consistent with the known left MCA territory infarct. 2. Mild scattered atherosclerotic change elsewhere throughout the major arterial vasculature of the head and neck. No other hemodynamically significant or correctable stenosis. 3. Large left greater than right layering pleural effusions, partially visualized. Electronically  Signed   By: Jeannine Boga M.D.   On: 02/17/2019 22:44   Dg Abd 1 View  Result Date: 02/19/2019 CLINICAL DATA:  NG tube repositioning EXAM: ABDOMEN - 1 VIEW COMPARISON:  None. FINDINGS: There is a nasogastric tube entering the stomach and being redirected cephalad with the tip in the distal esophagus directed cephalad. There is no bowel dilatation to suggest obstruction. There is no evidence of pneumoperitoneum, portal venous gas or pneumatosis. There are no pathologic calcifications along the expected course of the ureters. Large left pleural effusion.  Small right pleural effusion. The osseous structures are unremarkable. IMPRESSION: Nasogastric tube entering the stomach and being redirected cephalad with the tip in the distal esophagus directed cephalad. Recommend repositioning prior to use. Electronically Signed   By: Kathreen Devoid   On: 02/19/2019 15:12   Dg Abd 1 View  Result Date: 02/19/2019 CLINICAL DATA:  Nasogastric tube repositioning EXAM: ABDOMEN - 1 VIEW COMPARISON:  None. FINDINGS: There is a nasogastric tube entering the stomach and being redirected cephalad with the tip in the distal esophagus directed cephalad. There is no bowel dilatation to suggest obstruction. There is no evidence of pneumoperitoneum, portal venous gas or pneumatosis. There are no pathologic calcifications along the expected course of the ureters. Large left pleural effusion. Small right pleural effusion. Bibasilar atelectasis. The osseous  structures are unremarkable. IMPRESSION: Nasogastric tube entering the stomach and being redirected cephalad with the tip in the distal esophagus directed cephalad. Recommend repositioning prior to use. Electronically Signed   By: Kathreen Devoid   On: 02/19/2019 15:11   Dg Abd 1 View  Result Date: 02/19/2019 CLINICAL DATA:  NG tube placed EXAM: ABDOMEN - 1 VIEW COMPARISON:  None. FINDINGS: There is a nasogastric tube entering the stomach and being redirected cephalad with the  tip in the distal esophagus directed cephalad. There is no bowel dilatation to suggest obstruction. There is no evidence of pneumoperitoneum, portal venous gas or pneumatosis. There are no pathologic calcifications along the expected course of the ureters. The osseous structures are unremarkable. IMPRESSION: There is a nasogastric tube entering the stomach and being redirected cephalad with the tip in the distal esophagus directed cephalad. Recommend repositioning prior to use. Electronically Signed   By: Kathreen Devoid   On: 02/19/2019 14:59   Ct Angio Neck W Or Wo Contrast  Result Date: 02/17/2019 CLINICAL DATA:  Initial evaluation for acute stroke. EXAM: CT ANGIOGRAPHY HEAD AND NECK TECHNIQUE: Multidetector CT imaging of the head and neck was performed using the standard protocol during bolus administration of intravenous contrast. Multiplanar CT image reconstructions and MIPs were obtained to evaluate the vascular anatomy. Carotid stenosis measurements (when applicable) are obtained utilizing NASCET criteria, using the distal internal carotid diameter as the denominator. CONTRAST:  54mL OMNIPAQUE IOHEXOL 350 MG/ML SOLN COMPARISON:  Prior CT and MRI from earlier same day. FINDINGS: CTA NECK FINDINGS Aortic arch: Visualized aortic arch of normal caliber with normal 3 vessel morphology. Mild atheromatous plaque within the arch and about the origin of the great vessels without hemodynamically significant stenosis. Visualized subclavian arteries widely patent. Right carotid system: Right common and internal carotid arteries widely patent without stenosis, dissection or occlusion. Mild atheromatous plaque about the right bifurcation without significant narrowing. Left carotid system: Left common carotid artery widely patent from its origin to the bifurcation. Bulky eccentric calcified plaque at the origin of the left ICA with no more than mild 25% stenosis. Left ICA widely patent distally to the skull base without  stenosis, dissection or occlusion. Vertebral arteries: Both vertebral arteries arise from the subclavian arteries. Right vertebral artery slightly dominant. Vertebral arteries widely patent within the neck without stenosis, dissection or occlusion. Skeleton: No acute osseous finding. No discrete lytic or blastic osseous lesions. Degenerative disc bulging noted at C6-7 without significant stenosis. Other neck: No other acute soft tissue abnormality within the neck. No mass lesion or adenopathy. Upper chest: Large left with moderate right layering pleural effusions with associated atelectasis partially visualized. Oblique linear densities within the anterior right upper lobe most consistent with atelectasis and/or scarring. Visualized upper chest demonstrates no other acute finding. Review of the MIP images confirms the above findings CTA HEAD FINDINGS Anterior circulation: Petrous segments widely patent bilaterally. Mild scattered atheromatous plaque within the carotid siphons without significant stenosis. A1 segments, anterior communicating artery common anterior cerebral arteries widely patent. M1 segments widely patent bilaterally. Normal MCA bifurcations. On the left, there is abrupt occlusion of a proximal left M3 branch (series 6, image 463), in keeping with the left MCA territory infarct. Left MCA branches otherwise perfused. Distal right MCA branches well perfused without obstruction. Posterior circulation: Vertebral arteries widely patent to the vertebrobasilar junction without stenosis. Both picas patent. Basilar widely patent to its distal aspect without stenosis. Superior cerebral arteries patent bilaterally. Right PCA supplied via the basilar. Left  PCA supplied via a hypoplastic left P1 segment as well as a robust left posterior communicating artery. PCAs widely patent to their distal aspects without stenosis. Venous sinuses: Patent. Calcified left parafalcine meningioma again noted. No invasion of the  adjacent superior sagittal sinus. Anatomic variants: None significant. Review of the MIP images confirms the above findings IMPRESSION: 1. Acute proximal left M3 occlusion, consistent with the known left MCA territory infarct. 2. Mild scattered atherosclerotic change elsewhere throughout the major arterial vasculature of the head and neck. No other hemodynamically significant or correctable stenosis. 3. Large left greater than right layering pleural effusions, partially visualized. Electronically Signed   By: Jeannine Boga M.D.   On: 02/17/2019 22:44   Dg Chest Port 1 View  Result Date: 02/19/2019 CLINICAL DATA:  Hypoxia. EXAM: PORTABLE CHEST 1 VIEW COMPARISON:  02/17/2019 FINDINGS: Heart size is normal. Aortic atherosclerosis. New airspace disease is identified within the right upper lobe. Small to moderate right pleural effusion. Moderate to large left pleural effusion has increased in volume from previous exam significantly diminished aeration to the left lung. IMPRESSION: 1. Increase in volume of pleural effusions, left greater than right. 2. New right upper lobe airspace opacity concerning for pneumonia. Electronically Signed   By: Kerby Moors M.D.   On: 02/19/2019 10:17   Dg Abd Portable 1v  Result Date: 02/19/2019 CLINICAL DATA:  NG tube placement EXAM: PORTABLE ABDOMEN - 1 VIEW COMPARISON:  Earlier same day FINDINGS: There is a nasogastric tube coiled in the distal esophagus with the tip directed cephalad outside the field of view. There is no bowel dilatation to suggest obstruction. There is no evidence of pneumoperitoneum, portal venous gas or pneumatosis. There are no pathologic calcifications along the expected course of the ureters. There is a trace right pleural effusion. There is a moderate left pleural effusion. The osseous structures are unremarkable. IMPRESSION: Nasogastric tube coiled in the distal esophagus with the tip directed cephalad outside the field of view. Recommend  repositioning the nasogastric tube prior to use. Electronically Signed   By: Kathreen Devoid   On: 02/19/2019 17:13    ROS Blood pressure 138/73, pulse 95, temperature 97.6 F (36.4 C), temperature source Axillary, resp. rate 20, height 5\' 3"  (1.6 m), weight 46.9 kg, SpO2 96 %. Neurologic Examination:  Lethargic.  Non-verbal.  She does open eyes slightly  to excessive stimulation and makes subtle head nods.  She does not follow commands.  Ulcerations on the heels and sacrum.  Grimaces slightly to pain in all 4 extremities.  No babinski or hoffman's.     Assessment/Plan:  Based on history of failure to thrive and global severe atrophy in the brain, I believe she has had a neurodegenerative process (eg Alzheimer's dementia) for quite some time now since this kind of atrophy does not happen overnight.  Amidst a vulnerable brain background, she developed cardioembolic stroke to the left frontal lobe causing her decompensate further and develop a possible Broca's type aphasia as well.  She may have had an unwitnessed seizure secondary to the stroke or secondary to the underlying dementia or combination of both.  The moderate to severe generalized slowing on routine EEG is indicative of underlying neurodegenerative process as well.  She may be having subtle electrographic seizures contributing to her mental status, so I will start a continuous EEG monitoring and initiate AED if seizures are seen.  I explained to the daughter that it is not likely that she will have a significant improvement in her  condition given the suspected underlying neurogenerative process.  Stroke work up has been completed.  I will continue antiplatelet therapy rectally and avoid anticoagulation for PAF in the acute period until about 2 weeks from now when we an re-visit the issue.  I recommend against aggressive measures at this time and daughter is starting to become more understanding of that it seems.    Rogue Jury,  MD 02/19/2019, 5:27 PM

## 2019-02-19 NOTE — Progress Notes (Signed)
RN placed Dubhoff NGT in Left Nare.  Abd. Xray shows kinking of tube, RN pulled back.  RN updated receiving nurse Jana Half that a KUB will be needed upon pt. Arrival to confirm placement.  Wire still in [place] at transport.

## 2019-02-19 NOTE — Progress Notes (Signed)
[facility] Progress Note  Patient: Sydney [name] [id number]   PCP: [name], MD DOB: 1931-04-05   DOA: 02/18/2019   DOS: 02/19/2019   Date of Service: the patient was seen and examined on 02/19/2019  Chief Complaint  Patient presents with   Low oxygen saturation   Brief hospital course: Patient presented with complaints of hypoxia on 02/10/2019.  Chest x-ray was performed on 02/10/2019 which showed large bilateral pleural effusion left more than right, Evidence of worsening bilateral pleural effusion as well as possible infiltrate. CT scan of the chest without contrast was performed which showed complete atelectasis of the left lower lobe and large amount of abnormal density in the left mainstem bronchus and lower lobe bronchi concerning for aspirated material or mucous plugging. Patient was requiring 3-4 L of oxygen to maintain 100% saturation on admission.  Initial plan was to perform thoracentesis and treat aspiration pneumonia.  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 On 02/16/2019 patient was unable to tolerate thoracentesis safely and therefore the procedure was aborted.  Order for flutter valve and chest vest for patient's mucous plugging although chest vest was refused by family and patient was not successfully able to perform flutter valve.  Given patient's stability of oxygenation as well as multiple comorbidities decision was made that the patient is not a bronchoscopy candidate.  On 02/17/2019 patient went into A. fib with RVR, patient was given IV Cardizem bolus with rate control followed by started on Cardizem infusion. Around 9 AM patient was last seen normal.  Around 10:19 AM I was called at bedside to evaluate the patient as she was not responding to painful stimuli.  At the time of my evaluation patient was lethargic unable to follow any commands.  Code stroke  was called.  CT head was performed.  Patient was evaluated by teleneurology.  Patient was hypoglycemic with blood sugar in 50s and was given an amp of D50 with eventual improvement in her blood sugar at 175 without any improvement in patient's mentation.  After evaluation by teleneurology it was felt that the patient may have suffered from seizure and is currently postictal and since there was no focal finding on exam it was felt that the patient is suffering from metabolic encephalopathy. Stat EEG was performed which was not showing any acute seizures and no seizure focus as well and showed metabolic encephalopathy. Metabolic work-up was unremarkable other than hyperglycemia. ABG showed no hypercarbia and no significant hypoxia or acidosis or alkalosis.  Stat MRI brain was performed which was positive for acute stroke in the left MCA territory. Discussed with Dr. Cheral Marker in neurology, based on the MRI, it appears that the patient may have completed her infarct and therefore recommended transfer to [facility] and further stroke work-up as well as aspirin.  Not a candidate for aggressive intervention per neurology.  Given the size of the stroke anticoagulation was not recommended for A. fib. PT OT and speech therapy were already consulted on this patient. Echocardiogram was performed which shows 40 to 45% EF without any significant valvular abnormality. Patient remains n.p.o. For hypoglycemia patient was given IV D5 normal saline. A. fib with RVR was treated with IV Cardizem but patient converted to normal sinus rhythm therefore Cardizem infusion was on hold. CCM evaluated the patient as well at bedside.  At this point family requested patient to be transferred to [facility] as first priority and [facility]  [facility] as second priority before considering transfer to [place] currently does not have capacity for stepdown level patient care and therefore recommended to  call back tomorrow. [facility] is currently evaluating the patient for transfer.  02/18/2019.  No significant change in patient's condition.  Cardiology was consulted for A. fib with RVR as well as new systolic dysfunction.  [facility] wanted me to discuss with [facility], [facility], and [facility] for potential transfer.  [facility] or [facility] does not have any bed available right now for stepdown or intermediate level care.  [facility] has accepted the patient for transfer under neurology service although they do also do not have any beds available right now and anticipate no availability in the next few days.  Discussed with daughter again regarding goals of care and wants to continue full code ask her about nutrition and she has not made any decisions yet.  02/19/2019.  Patient appears in increased respiratory distress.  Examination shows bilateral crackles and expiratory wheezing.  Stat chest x-ray shows evidence of right-sided pneumonia which is new as well as worsening left pleural effusion.  IV fluids were stopped.  Patient was given IV albumin followed by IV Lasix.  Patient was also given Xopenex nebulizer treatment.  Option was given to the family regarding nutrition and they decided to go with NG tube insertion.  Patient had a bed available at [facility].  Patient was transferred to [facility] on stroke floor progressive care unit.  Currently further plan is continue further stroke work-up and supportive care.  Continue to engage with the family regarding goals of care.  Subjective: Patient remains nonverbal.  Unable to follow any commands.  Has increased respiratory distress.  Heart rate in 120s.  Oxygenation remained stable.  Respiratory rate in 25.  No acute events overnight reported.  Assessment and Plan: 1.  Acute left MCA infarct Code stroke was called, patient was felt suffering from metabolic encephalopathy as she  was nonfocal on the initial evaluation and due to hypoglycemia seizure was high in the differential. MRI brain confirms left MCA infarct. Suspect this is secondary to A. fib as etiology. Unable to anticoagulate the patient at this point given size of the infarct. Patient likely appear to have completed her stroke therefore not a candidate for any aggressive intervention per neurology. CTA head and neck were initially not performed due to patient's poor creatinine clearance of 25 and acute kidney injury on chronic kidney disease as well as risk for further worsening of her renal function. After discussion with the family as well as neurologist CTA head and neck was ordered after their agreement. Aspirin 300 suppository given. N.p.o. PT OT and speech evaluation. Patient to be transferred to [facility] for stroke work-up. Family currently wants to pursue aggressive care but would like to have thorough discussion of risk and benefit before any change in plan of care.  2.  Acute hypoxic respiratory failure Bilateral pleural effusion left greater than right. Left lower lobe atelectasis Mucous plugging versus aspiration of food debris's on left main bronchus. Presented with hypoxia identified at wound care center when patient presented with follow-up. Per daughter there were no acute events or complaints after her discharge from the hospital. Per daughter patient was eating well at home. CT scan shows evidence of worsening bilateral pleural effusion thought to be secondary to diastolic dysfunction as well as reduced oncotic pressure.  Admitting provider discussed with infectious disease Dr. Johnnye Sima and started the patient on IV Flagyl for possible aspiration coverage. 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 IR was consulted for thoracentesis although patient was white initially agreeable on the table was refusing to go  through the procedure and was screaming and begging not to perform the procedure, moving a lot around therefore the procedure was aborted. Decision was made to repeat attempt next day with IV sedation if the patient's daughter agrees for it. Given current acute stroke as well as A. fib with RVR currently holding off on further procedure unless absolutely needed. Patient's oxygenation remained stable on 3 L at 100%. Monitor.  For mucous plugging/food debris's patient was deemed not a candidate for bronchoscopic evaluation given her multiple comorbidities.  Chest vest was ordered but daughter felt that it would be too strong for the patient therefore was discontinued. Flutter valve was ordered but the patient is unable to perform consistently and is unlikely to perform consistently given her acute stroke right now with poor responsiveness. Patient remains n.p.o. for now.  On 02/19/2019 patient's chest x-ray shows no evidence of right-sided infiltrate likely from aspiration of patient's own saliva.  MRSA PCR is negative.  Patient is on IV cefepime and Flagyl therefore we will continue the same.  If the patient has NG tube inserted ID would like to transition patient from Flagyl to oral vancomycin.  3.  A. fib with RVR/now sinus rhythm CHA2DS2-VASc Score 4/5 No prior history of A. fib identified in the chart. Likely in the setting of her respiratory illness. Currently back in normal sinus rhythm. Initially given 10 mg dose of IV Cardizem bolus followed by 5 mg infusion with titration upwards. Converted to normal sinus rhythm spontaneously. Continue to maintain in the stepdown unit.  Use as needed Lopressor in case of RVR although avoid hypotension in patient with stroke. Unlikely that the acute A. fib has caused her acute CVA.  Although the patient has not been able to take her aspirin or Plavix due to her refusal to take this medicine as well as refusal to use Lovenox. May need amiodarone infusion  if remains persistently tachycardic. Now that the echocardiogram is showing low EF Cardizem is not an option. Use IV amiodarone for RVR and if the blood pressure allows IV Lopressor for urgent rate control. Per neurology no anticoagulation for 2 weeks.  Repeat CT head to rule out hemorrhagic transformation before initiation of anticoagulation at the end of 2 weeks.  4.  Chronic diastolic CHF with acute systolic worsening. Patient did not have any chest pain early in the morning when I evaluated her when she was in A. fib with RVR as well as prior to that. EKG shows T wave inversions in the lateral leads but otherwise no significant ischemic findings. At present continue to treat conservatively. Patient does have evidence of third spacing given her large pleural effusion but due to her poor p.o. intake patient was on on IV fluids per family request. Continue to monitor for now. Not a candidate for anticoagulation given her acute stroke, not a candidate for intervention given her multiple comorbidities. Cardiology consulted, currently signed off.  Recommend to continue aspirin, no further work-up recommended. Evidence of volume overload on the chest x-ray on 02/19/2019.  Discontinued IV fluids.  IV albumin followed by IV Lasix given.  Monitor for improvement.  5.  Acute kidney injury Based on renal function of  serum creatinine 0.7-0.8. Presents with a serum creatinine of 1.2-1.24.  Currently 1.16. Evidence of dehydration clinically. Patient is currently receiving IV fluids although at risk for volume overload and therefore monitor closely. Patient is also at risk for worsening of renal function given the need for CT scan on contrast but currently family agrees for the procedure and neurology has requested the procedure. Renal function somewhat better after receiving IV hydration Monitor for renal function worsening hospital receiving IV Lasix.  6.  Hypokalemia. Replaced aggressively.  7.   Metabolic acidosis. Likely in the setting of acute kidney injury. Currently receiving IV hydration monitor. ABG shows no evidence of respiratory acidosis.  8.  Failure to thrive, poor p.o. intake, severe protein calorie malnutrition Patient has progressive decline over last few months. Multiple hospitalization, 8/21, 8/24-10/6, 10/8, and now 10/21. Extensive evaluation performed by multiple specialists. Patient has poor p.o. intake.  GI evaluation was performed but no clear etiology was identified.  Core track was attempted in the past.  GI felt that the patient is not a candidate for PEG tube placement.  IR felt that performing PEG tube in the presence of ascites is high risk. After that initial discussion patient was felt not a candidate for any feeding tube placement and the family has also decided not to pursue that. Patient is at risk for aspiration given her dementia and deconditioning and multiple speech evaluation has been performed. Patient was allowed dysphagia 3 diet with thin liquids. Presents this admission with a possible aspiration event. Severely dehydrated at the time of my evaluation with cachexia. Patient has sacral ulcer which is nonhealing despite aggressive intervention including hydrotherapy. Patient is refusing Ensure, prostat, Juven during this hospital stay. Now that the patient is suffering from an acute left MCA moderate-sized stroke her p.o. intake is going to be worse than before. Sacral ulcer wound care is causing significant pain to the patient but patient is also refusing pain medication as well as family prefers not to use them. Given the lack of adequate nutrition I suspect her condition will worsen with or without the presence of the stroke down the road. Patient is suffering from acute kidney injury from poor p.o. intake as well as hypoglycemia. Was receiving IV D5 normal saline. After discussion with the family regarding nutrition status as well as  potential for hypoglycemia down the road family decided to proceed with NG tube insertion.  Explained to the family that NG tube insertion can also lead to risk for aspiration in a patient who already has 2 evidence of aspiration at 2 different events so far during this hospital stay. NG tube was inserted by bedside RN although placement was still in esophagus as it was coiling. Recommendation is to repeat x-ray abdomen once the patient reaches [facility]. May require IR guided tube placement.  Multiple evaluation from ethics committee as well as palliative care has been performed in the past as well. Family wants to pursue aggressive care at present. Continue to engage with family regarding goals of care.  9.  Goals of care discussion. I spent extensive amount of time discussing with the family and answering all the questions and concerns as well as explaining regarding various options of care that the patient should and should not get. Patient has acute stroke leaving her with significant deficit. Patient has worsening bilateral pleural effusion but unable to tolerate thoracentesis or diuretics and at risk for damage from either procedures. Patient also has acute kidney injury from p.o.  intake and her p.o. intake is not improving despite aggressive care. Given her aspiration risk do not think that the patient will benefit from any type of feeding tube placement. Patient has acute urinary retention and is now requiring Foley catheter. During the stroke work-up echocardiogram now shows EF of 40% with diffuse wall motion abnormality without any valvular abnormality. Patient also has evidence of aspiration based on the CT scan with left mainstem bronchus filled with mucous plug versus food debris. Patient has nonhealing sacral ulcer present since August progressively worsening and without any adequate nutrition this wound is unlikely to heal and will lead to further deconditioning. Her  acute stroke is going to make the wound worse as well due to lack of mobility and poor p.o. intake. Patient is suffering from pain from the sacral ulcer as well as having difficulty breathing from bilateral pleural effusion. With all these comorbidities patient's prognosis is significantly guarded and in the setting of acute stroke as well as A. fib with RVR and now with new low EF prognosis is highly limited. I am concerned that the patient may have life-threatening event within the next week. PCCM was consulted and I agree with their recommendation that in the event of cardiac arrest patient's chances of survival is highly unlikely and in the event of survival meaningful recovery is near impossible given her acute stroke.  Patient should be DNR but family currently wants to pursue aggressive care and full code.  Explained to the family that DNR does not mean do not treat and we can still pursue other medical interventions within the limits of DNR. Explained to the family that given multiple organ failure patient is an active but slow process of dying and would benefit from transitioning to comfort care/hospice. Palliative care was consulted although in the past daughter who is the POA has clearly refused involvement of palliative care or any discussion in that regard. Ethics committee was also consulted for further assistance.  At this point given patient's multiple comorbidities I advise that the patient should not undergo CPR or intubation as there is no medical benefit of this procedures to the patient.  I also advised transition to comfort care as the patient is clearly suffering from pain from sacral ulcer which is not being treated right now.  Discussed with daughter on 02/18/2019 regarding lack of nutrition and chances of poor p.o. intake going forward in the setting of acute stroke and ask her if she would like to pursue with core track placement versus continue with IV D5 LR given that both  presents risk, 1 can make aspiration worse and 1 can make heart failure worse. Currently family has not made any decision regarding any intervention for nutrition and wants to continue with fluids. Also discussed regarding CODE STATUS, daughter wants to continue full code.  Explained to her that in the setting of now new acute stroke as well as systolic dysfunction chances of patient surviving a cardiac arrest is next to impossible and recommend DNR/DNI.  Again explained that DNR/DNI does not mean do not treat.  Repeat discussion on 02/19/2019 with friends Marlowe Aschoff as well as Lattie Haw on the phone and patient's daughter in person.  Explained patient's current poor prognosis.  Informed that the patient is suffering from multiorgan failure and the evidence of aspiration pneumonia on the right as well as pleural effusion is another sign that she may not survive for long.  Recommended to change the CODE STATUS from full code to DNR/DNI.  Family still wants to pursue aggressive care and full code.  10.  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 Currently no diarrhea. Monitor. Avoid PPI avoid other medication that can cause C. difficile or increase the risk. Currently on IV Flagyl for aspiration pneumonia. Currently patient is on IV cefepime for Pseudomonas UTI.  We will continue it for a total of 5 days.  Unable to provide oral vancomycin due to patient's acute stroke and dysphagia therefore we will continue with IV Flagyl.  11.  Left buttock decubitus ulcer. POA. Continue wound care per wound care recommendation. Family wants to pursue more frequent dressing changes, reconsulted wound care.  12.  History of glaucoma. Continue eyedrops per  13.  Severe protein calorie malnutrition. Decreased appetite but Patient is on Remeron and Megace which the patient is refusing during his hospital stay and is now unable to take due to her acute stroke. Not a candidate for PEG tube based on  GI and IR evaluation in the past. Currently at high risk for aspiration. Continue to pursue goals of care discussion.  14.  Pseudomonas UTI. Discussed with Dr. Graylon Good on phone. Currently recommending empirically treating the patient with IV cefepime for short duration. Unable to provide oral vancomycin given patient's acute stroke and dysphagia therefore we will continue with IV Flagyl.  Pressure Injury 02/06/2019 Buttocks Left Unstageable - Full thickness tissue loss in which the base of the ulcer is covered by slough (yellow, tan, gray, green or brown) and/or eschar (tan, brown or black) in the wound bed. (Active)  02/03/2019 2300  Location: Buttocks  Location Orientation: Left  Staging: Unstageable - Full thickness tissue loss in which the base of the ulcer is covered by slough (yellow, tan, gray, green or brown) and/or eschar (tan, brown or black) in the wound bed.  Wound Description (Comments):   Present on Admission: Yes   Diet: NPO, may start on tube feeding if the NG tube is approved to be used. DVT Prophylaxis: Subcutaneous Lovenox currently refusing  Advance goals of care discussion: Full code  Family Communication: family was present at bedside, at the time of interview. The pt provided permission to discuss medical plan with the family. Opportunity was given to ask question and all questions were answered satisfactorily.   Disposition:  Discharge to be determined.  Consultants: Palliative care PCCM, neurology, telemetry neurology, phone consultation with ID, ethics committee, consultation with [facility] neurology Procedures: Echocardiogram, EEG  Scheduled Meds:   stroke: mapping our early stages of recovery book   Does not apply Once   aspirin  300 mg Rectal Daily   chlorhexidine  15 mL Mouth Rinse BID   Chlorhexidine Gluconate Cloth  6 each Topical Daily   enoxaparin (LOVENOX) injection  30 mg Subcutaneous Q24H   latanoprost  1 drop Both Eyes QHS   levalbuterol  0.63  mg Nebulization Q6H   mouth rinse  15 mL Mouth Rinse q12n4p   Continuous Infusions:  sodium chloride Stopped (02/17/19 1441)   ceFEPime (MAXIPIME) IV Stopped (02/19/19 1131)   metronidazole Stopped (02/19/19 1015)   PRN Meds: sodium chloride, acetaminophen Antibiotics: Anti-infectives (From admission, onward)   Start     Dose/Rate Route Frequency Ordered Stop   02/18/19 1100  ceFEPIme (MAXIPIME) 2 g in sodium chloride 0.9 % 100 mL IVPB     2 g 200 mL/hr over 30 Minutes Intravenous Every 24 hours 02/18/19 1022     02/16/19 1500  cephALEXin (KEFLEX) capsule 500 mg  500 mg Oral Every 12 hours 02/16/19 1358 02/16/19 2220   02/22/2019 1800  metroNIDAZOLE (FLAGYL) IVPB 500 mg     500 mg 100 mL/hr over 60 Minutes Intravenous Every 8 hours 01/28/2019 1723     01/28/2019 1715  vancomycin (VANCOCIN) 50 mg/mL oral solution 125 mg  Status:  Discontinued     125 mg Oral Daily 02/07/2019 1710 02/11/2019 1728     Objective: Physical Exam: Vitals:   02/19/19 1200 02/19/19 1227 02/19/19 1300 02/19/19 1400  BP: (!) 144/80  (!) 142/78 138/73  Pulse: 89  94 95  Resp: 19  20 20   Temp:  97.6 F (36.4 C)    TempSrc:  Axillary    SpO2:      Weight:      Height:        Intake/Output Summary (Last 24 hours) at 02/19/2019 1530 Last data filed at 02/19/2019 1136 Gross per 24 hour  Intake 1396.35 ml  Output 350 ml  Net 1046.35 ml   Filed Weights   02/12/2019 1052  Weight: 46.9 kg   General: Lethargic but responds to painful stimuli and occasionally verbal stimuli, not able to follow any commands and not oriented to time, place, and person. Appear in moderate distress, affect unresponsive Eyes: PERRL, Conjunctiva normal ENT: Oral Mucosa Clear, dry  Neck: difficult to assess  JVD, no Abnormal Mass Or lumps Cardiovascular: S1 and S2 Present, no Murmur, peripheral pulses symmetrical Respiratory: increased respiratory effort, Bilateral Air entry equal and Decreased, no signs of accessory muscle use,  bilateral basal Crackles, bilateral expiratory wheezes Abdomen: Bowel Sound present, Soft and difficult to assess tenderness, no hernia Skin: no rashes  Extremities: no Pedal edema, no calf tenderness Neurologic: Motor strength reduced at right upper, left upper, right lower and left lower extremity, Sensation grossly normal to painful stimuli, Reflex difficult to assess and Resting tremor noted bilateraly Gait not checked due to patient safety concerns   Data Reviewed: I have personally reviewed and interpreted daily labs, tele strips, imagings as discussed above. I reviewed all nursing notes, pharmacy notes, vitals, pertinent old records I have discussed plan of care as described above with RN and patient/family.  CBC: Recent Labs  Lab 02/16/19 0505 02/17/19 0519 02/17/19 1318 02/18/19 0222 02/19/19 0234  WBC 15.1* 11.4* 13.0* 14.2* 12.3*  NEUTROABS  --  10.4* 12.2*  --   --   HGB 10.9* 9.1* 9.3* 8.1* 8.6*  HCT 33.8* 28.9* 30.8* 26.0* 28.0*  MCV 100.9* 102.1* 106.9* 105.3* 106.1*  PLT 171 152 143* 134* [id number]*   Basic Metabolic Panel: Recent Labs  Lab 02/16/19 0505 02/17/19 0519 02/17/19 1318 02/18/19 0222 02/19/19 0234  NA 138 141 138 139 143  K 4.6 3.2* 3.3* 4.4 4.7  CL 105 109 110 113* 117*  CO2 18* 20* 17* 17* 20*  GLUCOSE 49* 73 148* 118* 118*  BUN 33* 32* 30* 27* 23  CREATININE 1.24* 1.20* 1.16* 0.96 1.07*  CALCIUM 8.3* 8.4* 8.0* 7.7* 8.0*  MG  --  1.9 2.1 1.8  --    Liver Function Tests: Recent Labs  Lab 02/16/19 0505 02/17/19 0519 02/17/19 1318  AST 31 22 23   ALT 9 7 6   ALKPHOS 102 92 89  BILITOT 1.8* 1.1 1.3*  PROT 6.3* 5.8* 6.1*  ALBUMIN 2.4* 2.3* 2.4*   No results for input(s): LIPASE, AMYLASE in the last 168 hours. Recent Labs  Lab 02/17/19 1318  AMMONIA 15   Coagulation Profile: Recent Labs  Lab 02/18/19  0222  INR 1.4*   Cardiac Enzymes: No results for input(s): CKTOTAL, CKMB, CKMBINDEX, TROPONINI in the last 168 hours. BNP (last 3  results) No results for input(s): PROBNP in the last 8760 hours. CBG: Recent Labs  Lab 02/18/19 1935 02/18/19 2320 02/19/19 0406 02/19/19 0737 02/19/19 1149  GLUCAP 115* 96 120* 106* 102*   Studies: Dg Abd 1 View  Result Date: 02/19/2019 CLINICAL DATA:  NG tube repositioning EXAM: ABDOMEN - 1 VIEW COMPARISON:  None. FINDINGS: There is a nasogastric tube entering the stomach and being redirected cephalad with the tip in the distal esophagus directed cephalad. There is no bowel dilatation to suggest obstruction. There is no evidence of pneumoperitoneum, portal venous gas or pneumatosis. There are no pathologic calcifications along the expected course of the ureters. Large left pleural effusion.  Small right pleural effusion. The osseous structures are unremarkable. IMPRESSION: Nasogastric tube entering the stomach and being redirected cephalad with the tip in the distal esophagus directed cephalad. Recommend repositioning prior to use. Electronically Signed   By: Kathreen Devoid   On: 02/19/2019 15:12   Dg Abd 1 View  Result Date: 02/19/2019 CLINICAL DATA:  Nasogastric tube repositioning EXAM: ABDOMEN - 1 VIEW COMPARISON:  None. FINDINGS: There is a nasogastric tube entering the stomach and being redirected cephalad with the tip in the distal esophagus directed cephalad. There is no bowel dilatation to suggest obstruction. There is no evidence of pneumoperitoneum, portal venous gas or pneumatosis. There are no pathologic calcifications along the expected course of the ureters. Large left pleural effusion. Small right pleural effusion. Bibasilar atelectasis. The osseous structures are unremarkable. IMPRESSION: Nasogastric tube entering the stomach and being redirected cephalad with the tip in the distal esophagus directed cephalad. Recommend repositioning prior to use. Electronically Signed   By: Kathreen Devoid   On: 02/19/2019 15:11   Dg Abd 1 View  Result Date: 02/19/2019 CLINICAL DATA:  NG tube  placed EXAM: ABDOMEN - 1 VIEW COMPARISON:  None. FINDINGS: There is a nasogastric tube entering the stomach and being redirected cephalad with the tip in the distal esophagus directed cephalad. There is no bowel dilatation to suggest obstruction. There is no evidence of pneumoperitoneum, portal venous gas or pneumatosis. There are no pathologic calcifications along the expected course of the ureters. The osseous structures are unremarkable. IMPRESSION: There is a nasogastric tube entering the stomach and being redirected cephalad with the tip in the distal esophagus directed cephalad. Recommend repositioning prior to use. Electronically Signed   By: Kathreen Devoid   On: 02/19/2019 14:59   Dg Chest Port 1 View  Result Date: 02/19/2019 CLINICAL DATA:  Hypoxia. EXAM: PORTABLE CHEST 1 VIEW COMPARISON:  02/17/2019 FINDINGS: Heart size is normal. Aortic atherosclerosis. New airspace disease is identified within the right upper lobe. Small to moderate right pleural effusion. Moderate to large left pleural effusion has increased in volume from previous exam significantly diminished aeration to the left lung. IMPRESSION: 1. Increase in volume of pleural effusions, left greater than right. 2. New right upper lobe airspace opacity concerning for pneumonia. Electronically Signed   By: Kerby Moors M.D.   On: 02/19/2019 10:17     Time spent: 35 minutes   Author: Berle Mull, MD [facility] 02/19/2019 3:30 PM  To reach On-call, see care teams to locate the attending and reach out to them via [email or web address]. If 7PM-7AM, please contact night-coverage If you still have difficulty reaching the attending provider, please page the [facility] (Director on Call) for  [facility] on amion for assistance.

## 2019-02-19 NOTE — Progress Notes (Signed)
PT Cancellation Note  Patient Details Name: ONESHIA UBALDO MRN: [id number] DOB: [birth year]   Cancelled Treatment:      Reason Eval/Treat Not Completed: Fatigue/lethargy limiting ability to participate PT.  Pt chart reviewed and pt not responding to any stimuli at this time. Eyes open but not purposefully tracking or engaging. Due to level of alertness will hold evaluation for this date. Will continue to follow as pt appropriate.   Moris Ratchford 02/19/2019, 3:13 PM

## 2019-02-19 NOTE — Consult Note (Addendum)
NEURO HOSPITALIST CONSULT NOTE   Requesting physician: Dr. Posey Pronto  Reason for Consult: Subacute left MCA stroke  History obtained from:  Daughter and Chart     HPI:                                                                                                                                          Sydney [name] is an 85 y.o. female with multiple medical comorbidities who per daughter was last able to speak on Thursday, then was noted to be unable to do so on Friday. On Friday 10/23 she was found to be in atrial fibrillation. She was started on Cardizem gtt at that time. Shortly afterwards, she became unresponsive with glucose in the 50's and exhibiting possible seizure activity. EEG showed diffuse encephalopathic pattern without electrographic seizures. Teleneurology had also consulted on the patient on 10/23, noting that the patient's clinical picture appeared more consistent with an encephalopathy than a stroke. Teleneurology also noted that the patient was hypoglycemic and hypotensive when found. Possibly was thought to be a seizure but no clear focal deficits concerning for stroke. Teleneurology recommended a stroke work up. MRI brain was ordered, revealing a subacute left MCA territory ischemic infarction. Subsequently a CTA head and neck was obtained, revealing an acute proximal left M3 occlusion, consistent with the known left MCA territory infarct, as well as mild scattered atherosclerotic change elsewhere throughout the major arterial vasculature of the head and neck. Neurolohospitalist team was called to further evaluate.   MRI brain:  1. Motion degraded examination 2. Findings consistent with acute left MCA vascular territory infarct. 3. Generalized parenchymal atrophy and chronic small vessel ischemic disease. 4. Bilateral mastoid effusions.  Past Medical History:  Diagnosis Date  . Anemia    years ago  . Aortic atherosclerosis ([place])   . Arthritis    "left  hand" (02/17/2013)  . Bilateral inguinal hernia    INCIDENTAL CT 1/20  . C. difficile diarrhea   . Cataract   . Dehydration 12/29/2018  . Diplopia    CRANIAL 6 NERVE PALSY  . Dyslipidemia   . Gastritis   . Glaucoma   . Hypertension   . Leukopenia   . Meningioma ([place]) 05/01/2015  . Osteoporosis 02/2014   T score -2.5  followed by Dr. Lysle Rubens  . Palpitations    PACs, PVCs and short runs of atrial tachycardia on Holter monitoring  . PVD (peripheral vascular disease) ([place])   . Rhinitis   . Syncopal episodes 2003  . TIA (transient ischemic attack) 1990's  . Trigger thumb of left hand     Past Surgical History:  Procedure Laterality Date  . ANAL FISTULECTOMY  1970  . COLONOSCOPY    . COLONOSCOPY WITH PROPOFOL N/A 12/02/2016   Procedure: COLONOSCOPY  WITH PROPOFOL;  Surgeon: Gatha Mayer, MD;  Location: [facility] ENDOSCOPY;  Service: Endoscopy;  Laterality: N/A;  . 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 TONSILLECTOMY      Family History  Problem Relation Age of Onset  . Hypertension Father   . Heart attack Father   . Stroke Brother   . Stroke Maternal Grandmother              Social History:  reports that she has never smoked. She has never used smokeless tobacco. She reports that she does not drink alcohol or use drugs.  Allergies  Allergen Reactions  . Demerol [Meperidine] Other (See Comments)    Excessive sweating, cramping  . Other     No Antibiotic without consultation with her primary physician.  . Codeine Rash  . Sulfa Antibiotics Itching and Other (See Comments)    Reaction unknown  . Tomato Itching    MEDICATIONS:                                                                                                                     Scheduled: .  stroke: mapping our early stages of recovery book   Does not apply Once  . aspirin  300 mg Rectal Daily   . chlorhexidine  15 mL Mouth Rinse BID  . Chlorhexidine Gluconate Cloth  6 each Topical Daily  . enoxaparin (LOVENOX) injection  30 mg Subcutaneous Q24H  . latanoprost  1 drop Both Eyes QHS  . levalbuterol  0.63 mg Nebulization Q6H  . mouth rinse  15 mL Mouth Rinse q12n4p   Continuous: . sodium chloride Stopped (02/17/19 1441)  . albumin human    . ceFEPime (MAXIPIME) IV Stopped (02/19/19 1131)  . metronidazole Stopped (02/19/19 1015)     ROS:                                                                                                                                       Unable to obtain due to AMS.    Blood pressure 136/85, pulse 86, temperature 97.6 F (36.4 C), temperature source Axillary, resp. rate 19, height 5\' 3"  (1.6 m), weight 46.9 kg, SpO2 96 %.   General Examination:  Physical Exam  HEENT-  [place]/AT  Lungs- Respirations unlabored Extremities- No edema. Decreased muscle bulk x 4.   Neurological Examination Mental Status: Eyes open spontaneously. Not following commands or tracking visually. No attempts to communicate. Will deviate eyes towards examiner while he calls her name, but not consistently. No purposeful movement noted.  Cranial Nerves: II: PERRL. Blinks to threat bilateral temporal hemifields.  III,IV, VI: No ptorsis. Eyes conjugate. Does not track. Will deviate eyes towards examiner while he calls her name, but not consistently. Weak oculocephalic reflex present.  V,VII: Face symmetric. Reacts to eyelash stimulation bilaterally.  IX,X: Unable to assess XI: Head is midline XII: Unable to assess Motor/Sensory: Increased flexor tone of upper extremities bilaterally.  Increased extensor tone of lower extremities, left worse than right.  Does not move extremities to command.  No movement of upper extremities to light sternal rub or light pinch bilaterally.   Minimal movement of toes to plantar stimulation.   Deep Tendon Reflexes: Hypoactive throughout. Toes mute bilaterally. Cerebellar/Gait: Unable to assess   Lab Results: Basic Metabolic Panel: Recent Labs  Lab 02/16/19 0505 02/17/19 0519 02/17/19 1318 02/18/19 0222 02/19/19 0234  NA 138 141 138 139 143  K 4.6 3.2* 3.3* 4.4 4.7  CL 105 109 110 113* 117*  CO2 18* 20* 17* 17* 20*  GLUCOSE 49* 73 148* 118* 118*  BUN 33* 32* 30* 27* 23  CREATININE 1.24* 1.20* 1.16* 0.96 1.07*  CALCIUM 8.3* 8.4* 8.0* 7.7* 8.0*  MG  --  1.9 2.1 1.8  --     CBC: Recent Labs  Lab 02/16/19 0505 02/17/19 0519 02/17/19 1318 02/18/19 0222 02/19/19 0234  WBC 15.1* 11.4* 13.0* 14.2* 12.3*  NEUTROABS  --  10.4* 12.2*  --   --   HGB 10.9* 9.1* 9.3* 8.1* 8.6*  HCT 33.8* 28.9* 30.8* 26.0* 28.0*  MCV 100.9* 102.1* 106.9* 105.3* 106.1*  PLT 171 152 143* 134* 131*    Cardiac Enzymes: No results for input(s): CKTOTAL, CKMB, CKMBINDEX, TROPONINI in the last 168 hours.  Lipid Panel: Recent Labs  Lab 02/18/19 0222  CHOL 124  TRIG 123  HDL 20*  CHOLHDL 6.2  VLDL 25  LDLCALC 79    Imaging: Ct Angio Head W Or Wo Contrast  Result Date: 02/17/2019 CLINICAL DATA:  Initial evaluation for acute stroke. EXAM: CT ANGIOGRAPHY HEAD AND NECK TECHNIQUE: Multidetector CT imaging of the head and neck was performed using the standard protocol during bolus administration of intravenous contrast. Multiplanar CT image reconstructions and MIPs were obtained to evaluate the vascular anatomy. Carotid stenosis measurements (when applicable) are obtained utilizing NASCET criteria, using the distal internal carotid diameter as the denominator. CONTRAST:  74mL OMNIPAQUE IOHEXOL 350 MG/ML SOLN COMPARISON:  Prior CT and MRI from earlier same day. FINDINGS: CTA NECK FINDINGS Aortic arch: Visualized aortic arch of normal caliber with normal 3 vessel morphology. Mild atheromatous plaque within the arch and about the origin of the  great vessels without hemodynamically significant stenosis. Visualized subclavian arteries widely patent. Right carotid system: Right common and internal carotid arteries widely patent without stenosis, dissection or occlusion. Mild atheromatous plaque about the right bifurcation without significant narrowing. Left carotid system: Left common carotid artery widely patent from its origin to the bifurcation. Bulky eccentric calcified plaque at the origin of the left ICA with no more than mild 25% stenosis. Left ICA widely patent distally to the skull base without stenosis, dissection or occlusion. Vertebral arteries: Both vertebral arteries arise from the subclavian arteries.  Right vertebral artery slightly dominant. Vertebral arteries widely patent within the neck without stenosis, dissection or occlusion. Skeleton: No acute osseous finding. No discrete lytic or blastic osseous lesions. Degenerative disc bulging noted at C6-7 without significant stenosis. Other neck: No other acute soft tissue abnormality within the neck. No mass lesion or adenopathy. Upper chest: Large left with moderate right layering pleural effusions with associated atelectasis partially visualized. Oblique linear densities within the anterior right upper lobe most consistent with atelectasis and/or scarring. Visualized upper chest demonstrates no other acute finding. Review of the MIP images confirms the above findings CTA HEAD FINDINGS Anterior circulation: Petrous segments widely patent bilaterally. Mild scattered atheromatous plaque within the carotid siphons without significant stenosis. A1 segments, anterior communicating artery common anterior cerebral arteries widely patent. M1 segments widely patent bilaterally. Normal MCA bifurcations. On the left, there is abrupt occlusion of a proximal left M3 branch (series 6, image 463), in keeping with the left MCA territory infarct. Left MCA branches otherwise perfused. Distal right MCA branches  well perfused without obstruction. Posterior circulation: Vertebral arteries widely patent to the vertebrobasilar junction without stenosis. Both picas patent. Basilar widely patent to its distal aspect without stenosis. Superior cerebral arteries patent bilaterally. Right PCA supplied via the basilar. Left PCA supplied via a hypoplastic left P1 segment as well as a robust left posterior communicating artery. PCAs widely patent to their distal aspects without stenosis. Venous sinuses: Patent. Calcified left parafalcine meningioma again noted. No invasion of the adjacent superior sagittal sinus. Anatomic variants: None significant. Review of the MIP images confirms the above findings IMPRESSION: 1. Acute proximal left M3 occlusion, consistent with the known left MCA territory infarct. 2. Mild scattered atherosclerotic change elsewhere throughout the major arterial vasculature of the head and neck. No other hemodynamically significant or correctable stenosis. 3. Large left greater than right layering pleural effusions, partially visualized. Electronically Signed   By: Jeannine Boga M.D.   On: 02/17/2019 22:44   Ct Angio Neck W Or Wo Contrast  Result Date: 02/17/2019 CLINICAL DATA:  Initial evaluation for acute stroke. EXAM: CT ANGIOGRAPHY HEAD AND NECK TECHNIQUE: Multidetector CT imaging of the head and neck was performed using the standard protocol during bolus administration of intravenous contrast. Multiplanar CT image reconstructions and MIPs were obtained to evaluate the vascular anatomy. Carotid stenosis measurements (when applicable) are obtained utilizing NASCET criteria, using the distal internal carotid diameter as the denominator. CONTRAST:  61mL OMNIPAQUE IOHEXOL 350 MG/ML SOLN COMPARISON:  Prior CT and MRI from earlier same day. FINDINGS: CTA NECK FINDINGS Aortic arch: Visualized aortic arch of normal caliber with normal 3 vessel morphology. Mild atheromatous plaque within the arch and about  the origin of the great vessels without hemodynamically significant stenosis. Visualized subclavian arteries widely patent. Right carotid system: Right common and internal carotid arteries widely patent without stenosis, dissection or occlusion. Mild atheromatous plaque about the right bifurcation without significant narrowing. Left carotid system: Left common carotid artery widely patent from its origin to the bifurcation. Bulky eccentric calcified plaque at the origin of the left ICA with no more than mild 25% stenosis. Left ICA widely patent distally to the skull base without stenosis, dissection or occlusion. Vertebral arteries: Both vertebral arteries arise from the subclavian arteries. Right vertebral artery slightly dominant. Vertebral arteries widely patent within the neck without stenosis, dissection or occlusion. Skeleton: No acute osseous finding. No discrete lytic or blastic osseous lesions. Degenerative disc bulging noted at C6-7 without significant stenosis. Other neck: No other acute  soft tissue abnormality within the neck. No mass lesion or adenopathy. Upper chest: Large left with moderate right layering pleural effusions with associated atelectasis partially visualized. Oblique linear densities within the anterior right upper lobe most consistent with atelectasis and/or scarring. Visualized upper chest demonstrates no other acute finding. Review of the MIP images confirms the above findings CTA HEAD FINDINGS Anterior circulation: Petrous segments widely patent bilaterally. Mild scattered atheromatous plaque within the carotid siphons without significant stenosis. A1 segments, anterior communicating artery common anterior cerebral arteries widely patent. M1 segments widely patent bilaterally. Normal MCA bifurcations. On the left, there is abrupt occlusion of a proximal left M3 branch (series 6, image 463), in keeping with the left MCA territory infarct. Left MCA branches otherwise perfused. Distal  right MCA branches well perfused without obstruction. Posterior circulation: Vertebral arteries widely patent to the vertebrobasilar junction without stenosis. Both picas patent. Basilar widely patent to its distal aspect without stenosis. Superior cerebral arteries patent bilaterally. Right PCA supplied via the basilar. Left PCA supplied via a hypoplastic left P1 segment as well as a robust left posterior communicating artery. PCAs widely patent to their distal aspects without stenosis. Venous sinuses: Patent. Calcified left parafalcine meningioma again noted. No invasion of the adjacent superior sagittal sinus. Anatomic variants: None significant. Review of the MIP images confirms the above findings IMPRESSION: 1. Acute proximal left M3 occlusion, consistent with the known left MCA territory infarct. 2. Mild scattered atherosclerotic change elsewhere throughout the major arterial vasculature of the head and neck. No other hemodynamically significant or correctable stenosis. 3. Large left greater than right layering pleural effusions, partially visualized. Electronically Signed   By: Jeannine Boga M.D.   On: 02/17/2019 22:44   Mr Brain Wo Contrast  Addendum Date: 02/17/2019   ADDENDUM REPORT: 02/17/2019 16:31 ADDENDUM: Redemonstrated 11 mm left parafalcine partially calcified dural-based mass overlying the left frontal lobe, consistent with small incidental meningioma. Electronically Signed   By: Kellie Simmering DO   On: 02/17/2019 16:31   Result Date: 02/17/2019 CLINICAL DATA:  Encephalopathy. Additional history provided: 85 year old female presents with altered mental status, left arm tremor. EXAM: MRI HEAD WITHOUT CONTRAST TECHNIQUE: Multiplanar, multiecho pulse sequences of the brain and surrounding structures were obtained without intravenous contrast. COMPARISON:  Noncontrast head CT 04/19/2019, brain MRI 12/21/2018 FINDINGS: Brain: Multiple sequences are motion degraded, limiting evaluation.  There is a moderate-size region of cortical/subcortical restricted diffusion centered within the left frontal operculum also involving portions of the left insula and left temporal lobe. Findings are consistent with acute left MCA vascular territory infarct. No evidence of intracranial mass. No midline shift or extra-axial fluid collection. No chronic intracranial blood products. Mild scattered and confluent T2/FLAIR hyperintensity within the cerebral white matter is nonspecific, but consistent with chronic small vessel ischemic disease. Moderate generalized parenchymal atrophy. Vascular: Flow voids maintained within the proximal large arterial vessels. Skull and upper cervical spine: No focal marrow lesion. Sinuses/Orbits: Visualized orbits demonstrate no acute abnormality. Small left sphenoid sinus air-fluid level. Bilateral mastoid effusions (greater on the right). These results were called by telephone at the time of interpretation on 02/17/2019 at 3:59 pm to provider [facility] PATEL , who verbally acknowledged these results. IMPRESSION: 1. Motion degraded examination 2. Findings consistent with acute left MCA vascular territory infarct. 3. Generalized parenchymal atrophy and chronic small vessel ischemic disease. 4. Bilateral mastoid effusions. Electronically Signed: By: Kellie Simmering DO On: 02/17/2019 16:05   Dg Chest Port 1 View  Result Date: 02/19/2019 CLINICAL DATA:  Hypoxia. EXAM: PORTABLE CHEST 1 VIEW COMPARISON:  02/17/2019 FINDINGS: Heart size is normal. Aortic atherosclerosis. New airspace disease is identified within the right upper lobe. Small to moderate right pleural effusion. Moderate to large left pleural effusion has increased in volume from previous exam significantly diminished aeration to the left lung. IMPRESSION: 1. Increase in volume of pleural effusions, left greater than right. 2. New right upper lobe airspace opacity concerning for pneumonia. Electronically Signed   By: Kerby Moors  M.D.   On: 02/19/2019 10:17   Dg Chest Port 1 View  Result Date: 02/17/2019 CLINICAL DATA:  Hypoglycemia.  Hypotension.  Shortness of breath. EXAM: PORTABLE CHEST 1 VIEW COMPARISON:  Chest x-ray 01/28/2019. FINDINGS: Mediastinum hilar structures normal. Heart size normal. Bilateral pleural effusions, left side greater than right. Underlying pulmonary infiltrates particularly on the left cannot be excluded. Cavitation left lung base cannot be excluded. Bibasilar atelectasis. No pneumothorax. Degenerative change and scoliosis thoracic spine. IMPRESSION: 1. Bilateral pleural effusions, left side greater than right. Underlying pulmonary infiltrates particularly on the left cannot be excluded. Cavitation in the left lung base cannot be excluded. 2.  Bibasilar atelectasis. Electronically Signed   By: Marcello Moores  Register   On: 02/17/2019 13:49    Assessment/Recommendations: 85 year old female with multiple medical comorbidities and subacute left MCA stroke on MRI.  1. Exam reveals an awake, cachectic, aphasic female with little to no spontaneous movement, decreased responsiveness to external stimuli, and increased tone in her upper and lower extremities. Findings on exam best localize as diffuse cortical dysfunction and also reflect the acute left MCA infarction seen on MRI.  2. CTA of head and neck reveals acute proximal left M3 occlusion, consistent with the known left MCA territory infarct, as well as mild scattered atherosclerotic change elsewhere throughout the major arterial vasculature of the head and neck.   3. Dr. Orlena Sheldon was called by Hospitalist earlier today regarding the acute left MCA distribution ischemic infarct in the context of patient having acute onset of atrial fibrillation.  Dr. Orlena Sheldon recommended holding off on anticoagulation for a couple of weeks and repeating CT Brain at that time to ensure no natural hemorrhagic transformation before initiating anticoagulation. He also recommended  against the ASA + Plavix combination that is on her orders.  She is not swallowing pills regardless.  He instructed team to stop the Plavix and change ASA to rectal 300 mg qd. I agree with Dr. Alessandra Bevels clinical assessment and plan regarding not initiating anticoagulation, but would restart Plavix as soon as an NGT can be placed, for DAPT. I have discussed this with patient's daughter, who expressed understanding but wishes to consider whether or not to place an NGT first.    4. Severe cerebral atrophy on MRI. May have an underlying degenerative dementia. Agree with Dr. Orlena Sheldon regarding this.  5. EEG from 10/23 showed findings most consistent with a mild to moderate diffuse encephalopathy, nonspecific to etiology. No seizures or epileptiform discharges were seen throughout the recording. 6. Agree with Dr. Orlena Sheldon that she may be having subtle electrographic seizures contributing to her mental status. Continuous EEG can be started tomorrow and initiate AED if seizures are seen.  7. Stroke Team to follow in the AM.    Electronically signed: Dr. Kerney Elbe 02/19/2019, 12:42 PM

## 2019-02-19 NOTE — Progress Notes (Signed)
Patient seen and examined.  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  Discussed plan for removal and replacement by Radiology under fluoro.  Daughter agrees to plan.  Will start TFs after.

## 2019-02-19 NOTE — Progress Notes (Addendum)
Pt returned from radiology after coretrak placement, coretrak noted to be curled in pts mouth. On call provider notified, orders received to do XR & check placement.  XR shows placement in distal stomach, On call provider notified. Order to pull back portion of tube in pt's mouth and re-advance.   CN assisted primary RN with re-advancement of coretrak tube, tube now positioned at back of pt's throat and marked at 88cm. Auscultated for placement, + air noted in stomach.  Pt HR noted to be in the 130's during advancement of tube, SpO2 decreased to 89-90%. After several minutes, pt's HR noted to remain in ST @ 133, +JVD noted with accessory muscle use, and SpO2 at 90% on 5L. On call provider notified of findings, came to pt's room to see pt.   On call provider calling pt's daughter, primary RN administering lasix, metoprolol per new orders. RT in to see pt, NT sx at this time & placed on HFNC. Pt HR down to low 100's, SpO2 95-96% on 5L HFNC. On call provider states pt's daughter will call back. Pt resting with eyes closed, NAD at this time.

## 2019-02-19 NOTE — Progress Notes (Signed)
OT Cancellation Note  Patient Details Name: KYRIANNA PETTITT MRN: [id number] DOB: May 13, 1930   Cancelled Treatment:    Reason Eval/Treat Not Completed: Fatigue/lethargy limiting ability to participate OT order received, pt chart reviewed. Not responding to any stimuli at this time. Eyes open but not purposefully tracking or engaging. Due to level of alertness will hold evaluation for this date. Will continue to follow as pt appropriate.  Zenovia Jarred, MSOT, OTR/L Behavioral Health OT/ Acute Relief OT [facility] Office: [phone]  Zenovia Jarred 02/19/2019, 1:30 PM

## 2019-02-20 ENCOUNTER — Inpatient Hospital Stay (HOSPITAL_COMMUNITY): Payer: Medicare Other

## 2019-02-20 DIAGNOSIS — Z8719 Personal history of other diseases of the digestive system: Secondary | ICD-10-CM

## 2019-02-20 DIAGNOSIS — R4182 Altered mental status, unspecified: Secondary | ICD-10-CM | POA: Diagnosis not present

## 2019-02-20 DIAGNOSIS — N390 Urinary tract infection, site not specified: Secondary | ICD-10-CM | POA: Diagnosis not present

## 2019-02-20 DIAGNOSIS — R627 Adult failure to thrive: Secondary | ICD-10-CM | POA: Diagnosis not present

## 2019-02-20 DIAGNOSIS — E872 Acidosis: Secondary | ICD-10-CM

## 2019-02-20 DIAGNOSIS — R918 Other nonspecific abnormal finding of lung field: Secondary | ICD-10-CM

## 2019-02-20 DIAGNOSIS — Z8619 Personal history of other infectious and parasitic diseases: Secondary | ICD-10-CM

## 2019-02-20 DIAGNOSIS — Z9889 Other specified postprocedural states: Secondary | ICD-10-CM

## 2019-02-20 DIAGNOSIS — J9809 Other diseases of bronchus, not elsewhere classified: Secondary | ICD-10-CM

## 2019-02-20 DIAGNOSIS — R4789 Other speech disturbances: Secondary | ICD-10-CM | POA: Diagnosis not present

## 2019-02-20 DIAGNOSIS — D649 Anemia, unspecified: Secondary | ICD-10-CM | POA: Diagnosis not present

## 2019-02-20 DIAGNOSIS — J90 Pleural effusion, not elsewhere classified: Secondary | ICD-10-CM | POA: Diagnosis not present

## 2019-02-20 DIAGNOSIS — I63512 Cerebral infarction due to unspecified occlusion or stenosis of left middle cerebral artery: Secondary | ICD-10-CM | POA: Diagnosis not present

## 2019-02-20 DIAGNOSIS — E43 Unspecified severe protein-calorie malnutrition: Secondary | ICD-10-CM

## 2019-02-20 DIAGNOSIS — B965 Pseudomonas (aeruginosa) (mallei) (pseudomallei) as the cause of diseases classified elsewhere: Secondary | ICD-10-CM

## 2019-02-20 DIAGNOSIS — I4891 Unspecified atrial fibrillation: Secondary | ICD-10-CM | POA: Diagnosis not present

## 2019-02-20 DIAGNOSIS — L98429 Non-pressure chronic ulcer of back with unspecified severity: Secondary | ICD-10-CM

## 2019-02-20 DIAGNOSIS — E876 Hypokalemia: Secondary | ICD-10-CM

## 2019-02-20 LAB — CBC
HCT: 26.6 % — ABNORMAL LOW (ref 36.0–46.0)
Hemoglobin: 8.6 g/dL — ABNORMAL LOW (ref 12.0–15.0)
MCH: 32.7 pg (ref 26.0–34.0)
MCHC: 32.3 g/dL (ref 30.0–36.0)
MCV: 101.1 fL — ABNORMAL HIGH (ref 80.0–100.0)
Platelets: 120 10*3/uL — ABNORMAL LOW (ref 150–400)
RBC: 2.63 MIL/uL — ABNORMAL LOW (ref 3.87–5.11)
RDW: 22.3 % — ABNORMAL HIGH (ref 11.5–15.5)
WBC: 13.4 10*3/uL — ABNORMAL HIGH (ref 4.0–10.5)
nRBC: 0.1 % (ref 0.0–0.2)

## 2019-02-20 LAB — GLUCOSE, CAPILLARY
Glucose-Capillary: 105 mg/dL — ABNORMAL HIGH (ref 70–99)
Glucose-Capillary: 109 mg/dL — ABNORMAL HIGH (ref 70–99)
Glucose-Capillary: 151 mg/dL — ABNORMAL HIGH (ref 70–99)
Glucose-Capillary: 176 mg/dL — ABNORMAL HIGH (ref 70–99)

## 2019-02-20 LAB — COMPREHENSIVE METABOLIC PANEL
ALT: 6 U/L (ref 0–44)
AST: 19 U/L (ref 15–41)
Albumin: 2.4 g/dL — ABNORMAL LOW (ref 3.5–5.0)
Alkaline Phosphatase: 95 U/L (ref 38–126)
Anion gap: 10 (ref 5–15)
BUN: 22 mg/dL (ref 8–23)
CO2: 18 mmol/L — ABNORMAL LOW (ref 22–32)
Calcium: 8.3 mg/dL — ABNORMAL LOW (ref 8.9–10.3)
Chloride: 118 mmol/L — ABNORMAL HIGH (ref 98–111)
Creatinine, Ser: 1.33 mg/dL — ABNORMAL HIGH (ref 0.44–1.00)
GFR calc Af Amer: 42 mL/min — ABNORMAL LOW (ref >60)
GFR calc non Af Amer: 36 mL/min — ABNORMAL LOW (ref >60)
Glucose, Bld: 127 mg/dL — ABNORMAL HIGH (ref 70–99)
Potassium: 3.5 mmol/L (ref 3.5–5.1)
Sodium: 146 mmol/L — ABNORMAL HIGH (ref 135–145)
Total Bilirubin: 1.4 mg/dL — ABNORMAL HIGH (ref 0.3–1.2)
Total Protein: 5.9 g/dL — ABNORMAL LOW (ref 6.5–8.1)

## 2019-02-20 LAB — LACTIC ACID, PLASMA: Lactic Acid, Venous: 2.2 mmol/L (ref 0.5–1.9)

## 2019-02-20 MED ORDER — VANCOMYCIN 50 MG/ML ORAL SOLUTION
125.0000 mg | Freq: Four times a day (QID) | ORAL | Status: DC
  Administered 2019-02-20: 125 mg via ORAL
  Filled 2019-02-20 (×3): qty 2.5

## 2019-02-20 MED ORDER — FUROSEMIDE 10 MG/ML IJ SOLN
40.0000 mg | Freq: Once | INTRAMUSCULAR | Status: AC
  Administered 2019-02-20: 40 mg via INTRAVENOUS
  Filled 2019-02-20: qty 4

## 2019-02-20 MED ORDER — LEVALBUTEROL HCL 0.63 MG/3ML IN NEBU
0.6300 mg | INHALATION_SOLUTION | Freq: Three times a day (TID) | RESPIRATORY_TRACT | Status: DC
  Administered 2019-02-20 – 2019-02-21 (×4): 0.63 mg via RESPIRATORY_TRACT
  Filled 2019-02-20 (×4): qty 3

## 2019-02-20 NOTE — Procedures (Addendum)
Patient Name: Sydney [name]  MRN: [id number]  Epilepsy Attending: Lora Havens  Referring Physician/Provider: Dr Rogue Jury Date: 02/20/2019 Duration: 23.41 mins  Patient history: 85 year old female with left MCA stroke and altered mental status.  EEG to evaluate for seizures.  Level of alertness: Awake/confused  AEDs during EEG study: None  Technical aspects: This EEG study was done with scalp electrodes positioned according to the 10-20 International system of electrode placement. Electrical activity was acquired at a sampling rate of 500Hz  and reviewed with a high frequency filter of 70Hz  and a low frequency filter of 1Hz . EEG data were recorded continuously and digitally stored.   Description: EEG showed continuous generalized polymorphic 2 to 5 Hz theta-delta slowing admixed with 13 to 15 Hz beta activity.  No clear posterior dominant rhythm was seen.  Physiologic photic driving was seen during photic stimulation.  Hyperventilation was not performed   Abnormality -Continuous slow, generalized  IMPRESSION: This study is suggestive of moderate to severe diffuse encephalopathy, nonspecific to etiology. No seizures or epileptiform discharges were seen throughout the recording.

## 2019-02-20 NOTE — Progress Notes (Signed)
Called by bedside RN multiple times regarding decline in pt's status. Pt is tachypneic, tachycardiac and hypoxic. Discussed goals of care with bedside RN. Charlesetta Ivory, the daughter, to discuss code status at this time. She appears to be open to changing code status to DNR if pt declines throughout the night. Informed daughter that I would keep her informed about pt status. Gave her my cell phone number in case she had any additional questions. Continue with current treatment plan for now.   Respiratory distress - Placed on [place] - Chest xray stat - Discussed pt current condition with bedside nurse anf family - ?Pulmonary edema. Lasix IV given  -Tachycardia - Metoprolol iV given -CMP, CBC ordered  NG Tube Placement - Xray of abdomen ordered. Core Trac pulled back.  - Hold on TF for now  Lovey Newcomer, NP [facility] 7p-7a [phone]  CRITICAL CARE Performed by: Neila Gear   Total critical care time: 30 minutes  Critical care time was exclusive of separately billable procedures and treating other patients.  Critical care was necessary to treat or prevent imminent or life-threatening deterioration.  Critical care was time spent personally by me on the following activities: development of treatment plan with patient and/or surrogate as well as nursing, discussions with consultants, evaluation of patient's response to treatment, examination of patient, obtaining history from patient or surrogate, ordering and performing treatments and interventions, ordering and review of laboratory studies, ordering and review of radiographic studies, pulse oximetry and re-evaluation of patient's condition.

## 2019-02-20 NOTE — Progress Notes (Signed)
   Vital Signs MEWS/VS Documentation      02/20/2019 0720 02/20/2019 0800 02/20/2019 0820 02/20/2019 1428   MEWS Score:  5  5  -  -   MEWS Score Color:  Red  Red  -  -   O2 Device:  -  -  HFNC  HFNC   O2 Flow Rate (L/min):  -  -  3 L/min  3 L/min   Level of Consciousness:  -  Responds to Pain  -  -      No acute change in patients condition at this time     Hinton Dyer  Tatumn Corbridge 02/20/2019,3:06 PM

## 2019-02-20 NOTE — Progress Notes (Signed)
STROKE TEAM PROGRESS NOTE   SUBJECTIVE Patient's daughter is at the bedside.  She remains barely responsive and tries to open her eyes to auditory and painful stimuli but remains aphasic and is not following commands.  She withdraws left side but has only trace withdrawal on the right side.  OBJECTIVE Vitals:   02/20/19 0820 02/20/19 1428  BP:    Pulse:    Resp:    Temp:    SpO2: 97% 95%     Physical Exam Frail malnourished looking elderly African-American lady not in distress. . Afebrile. Head is nontraumatic. Neck is supple without bruit.    Cardiac exam no murmur or gallop. Lungs are clear to auscultation. Distal pulses are well felt.  Both feet have boots with ulcerations on the heels and sacrum.Marland Kitchen Neurological Exam :  Patient is lethargic with eyes closed.  She barely opens eyes to sternal rub and auditory stimuli but will not sustain attention.  She is aphasic and does not follow commands.  She has left gaze preference but is able to move eyes partially to the right with doll's eye movements.  She blinks to threat on the left but not on the right.  She does grimace to pain.  Tongue midline.  Very occasional left sided upper and lower extremity spontaneous movements.  Tone is diminished on the right.  Right hemiplegia.  Trace withdrawal in the right lower and upper extremity to painful stimuli only.  Pertinent Laboratory Studies (past 3 days) / Diagnostics (past 24h) Recent Labs    02/18/19 0222 02/19/19 0234 02/20/19 0543  WBC 14.2* 12.3* 13.4*  HGB 8.1* 8.6* 8.6*  PLT 134* 131* 120*  NA 139 143 146*  K 4.4 4.7 3.5  CREATININE 0.96 1.07* 1.33*  GLUCOSE 118* 118* 127*    Dg Abd 1 View  Result Date: 02/19/2019 CLINICAL DATA:  Enteric tube placement EXAM: ABDOMEN - 1 VIEW COMPARISON:  Abdominal radiograph from earlier today FINDINGS: Weighted enteric tube tip in distal stomach. Contrast seen within the nondistended gastric lumen. No disproportionately dilated small bowel  loops. No evidence of pneumatosis or pneumoperitoneum. Partially visualized left greater than right pleural effusions at the lung bases. IMPRESSION: 1. Weighted enteric tube tip in the distal stomach. 2. Nonobstructive bowel gas pattern. 3. Partially visualized left greater than right pleural effusions at the lung bases. Electronically Signed   By: Ilona Sorrel M.D.   On: 02/19/2019 20:39   Dg Chest Port 1 View  Result Date: 02/20/2019 CLINICAL DATA:  Increased shortness of breath EXAM: PORTABLE CHEST 1 VIEW COMPARISON:  02/19/2019 FINDINGS: Feeding catheter has been advanced into the stomach. Large left-sided pleural effusion is again noted with some associated infiltrate. Patchy right perihilar opacities are noted. These are stable in appearance from the prior exam some superimposed vascular congestion is noted. Small right pleural effusion is seen. IMPRESSION: Feeding catheter within the stomach. Large left pleural effusion with bilateral parenchymal opacities. Vascular congestion somewhat increased from the prior study. Electronically Signed   By: Inez Catalina M.D.   On: 02/20/2019 00:40   Dg Chest Port 1 View  Result Date: 02/19/2019 CLINICAL DATA:  Nasogastric tube placement EXAM: PORTABLE CHEST 1 VIEW COMPARISON:  Chest radiograph dated 02/19/2019. FINDINGS: An enteric tube enters the esophagus and is coiled near the gastroesophageal junction with the tip overlying the upper thoracic esophagus. The heart and mediastinal contours are obscured. A large left pleural effusion with associated atelectasis/airspace disease appears similar to prior exam. Right upper lung interstitial  and airspace opacities appear unchanged. A small right pleural effusion is unchanged. Right basilar airspace opacities have increased. IMPRESSION: 1. Enteric tube is coiled in the distal esophagus, with the tip overlying the upper thoracic esophagus. Electronically Signed   By: Zerita Boers M.D.   On: 02/19/2019 18:44   Dg  Abd Portable 1v  Result Date: 02/19/2019 CLINICAL DATA:  NG tube placement EXAM: PORTABLE ABDOMEN - 1 VIEW COMPARISON:  Earlier same day FINDINGS: There is a nasogastric tube coiled in the distal esophagus with the tip directed cephalad outside the field of view. There is no bowel dilatation to suggest obstruction. There is no evidence of pneumoperitoneum, portal venous gas or pneumatosis. There are no pathologic calcifications along the expected course of the ureters. There is a trace right pleural effusion. There is a moderate left pleural effusion. The osseous structures are unremarkable. IMPRESSION: Nasogastric tube coiled in the distal esophagus with the tip directed cephalad outside the field of view. Recommend repositioning the nasogastric tube prior to use. Electronically Signed   By: Kathreen Devoid   On: 02/19/2019 17:13   Dg Loyce Dys Tube Plc W/fl W/rad  Result Date: 02/19/2019 CLINICAL DATA:  Request for NG tube placement. EXAM: NASO G TUBE PLACEMENT WITH FL AND WITH RAD CONTRAST:  20 cc Omnipaque iodinated contrast per NG tube. FLUOROSCOPY TIME:  Fluoroscopy Time:  3 minutes 42 seconds Radiation Exposure Index (if provided by the fluoroscopic device): 20.4 mGy Number of Acquired Spot Images: 0 COMPARISON:  Abdominal radiograph from earlier today. FINDINGS: Technically successful placement of 10 Fr CorTrak nasogastric tube under fluoroscopic guidance, with the tip verified in the gastric antrum. Injected contrast seen within the nondistended stomach. IMPRESSION: Technically successful placement of nasogastric tube under fluoroscopic guidance, with the tip verified in the gastric antrum. Tube is ready for use. Electronically Signed   By: Ilona Sorrel M.D.   On: 02/19/2019 20:38     ASSESSMENT Sydney [name] is a 85 y.o. 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  A brain scan also shows significant atrophy raising concern for  significant underlying dementia and her mental status being out of proportion to her stroke raises concern for nonconvulsive seizures.       RECOMMENDATIONS Continue long-term EEG monitoring overnight even though my support review of EEG does not show any ongoing seizure activity.  Patient's prognosis appears quite poor given disabling stroke as well as poor general medical condition with failure to thrive.  I had a long discussion with the patient's daughter and she is unlikely to recover to be able to swallow safely anytime soon and daughter informs me that she is not a good candidate for PEG due to medical condition.  She has been provided good care by the daughter and family at home so far but her nutritional needs are going to be challenging to meet in the near future.  Daughter wants to continue support for the next few days and see how she does but she is realistic and open to discussion with palliative care team about goals of care.  Discussed with Dr. Eudelia Bunch.  Greater than 50% time during this 35-minute visit was spent on counseling and coordination of care and discussion with care team.   Hospital day # [place], MD [place] for Schedule & Pager information 02/20/2019 4:14 PM    To contact Stroke Continuity provider, please refer to [email or web address]  After hours, contact General Neurology

## 2019-02-20 NOTE — Progress Notes (Signed)
Spot EEG complete followed by LTM  - results pending

## 2019-02-20 NOTE — Progress Notes (Signed)
PROGRESS NOTE    Sydney [name]  [id number] DOB: Feb 12, 1931 DOA: 02/18/2019 PCP: [name], MD      Brief Narrative:  Sydney [name] is a 85 y.o. 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    In the ER, pO2 60 mmHg, CT chest showed large bilateral pleural effusions, L>R as well as atelectasis of LLL and new density in left mainstem bronchus concerning for aspirated material or mucus plugging.  Please see my summary from 9/29 or previous summaries by my partners in the Hospitalist service for further details.      Interim summary: Patient admitted and started on supplemental O2, empiric antibiotics. 10/22 Thoracentesis attempted, patient could not tolerate 10/23 Patient developed new Afib with RVR, started on diltiazem, converted back to NSR 10/23 same day, patient became abruptly less responsive, CODE STROKE called, MRI brain showed left MCA stroke        Assessment & Plan:  Acute left MCA stroke -Non-invasive imaging showed normal carotids, acute proximal occlusion of hte left M3 -Echocardiogram showed no cardiogenic source of embolism, reduced EF 40-45% -Lipids ordered: LDL 79 -Aspirin ordered, on 300 PR daily -Atrial fibrillation: known, will wait 2 weeks until Nov 7 to anticoagulate    Acute hypoxic respiratory failure Aspiration with blocked L mainstem bronchus Possible aspiration pneumonia Never smoker.  No previous lung disease.   Not candidate for bronchoscopy -Continue Zosyn -Consult ID appreciate cares  Afib with RVR Reverted to sinus rhythm.  CHA2DS2-Vasc 6, holding anticoagulation until Nov 7 due to size of infarct. Rhythm back to sinus, now tachycardia.  Acute on chronic diastolic CHF Bilateral pleural effusions Had developed ascites and effusions earlier in hospitalization  due to hypoalbuminemia.  These have gotten progressively worse. -Repeat IV Lasix -Daily BMP  Sacral decubitus ulcer -WOC   Failure to thrive Severe protein calorie malnutrition NG placed last night. Radiographically confirmed position, also via auscultation/insufflation. -Start Osmolite 1.2 at half goal rate, titrate up  History Cdiff s/p stool transplant -Stop Flagyl -Start oral vancomycin  Hypernatremia -Monitor BMP daily   Normocytic anemia Stable, no clinical bleeding. -Trend Hgb     MDM and disposition: The below labs and imaging reports were reviewed and summarized above.  Medication management as above.  Extensive review of chart, discussion with Palliative Medicine, Dr. Posey Pronto, Neurology.  The patient was admitted with hypoxic respiratory failure, and has now developed Afib with RVR, and new stroke.  It is my medical opinion that Sydney [name] has hours to days to live.    It pains me that the nature of her presenting disease has never been illuminated better than a cloudy picture of Failure to Thrive.  Her laboratory studies have always been suggestive but nonspecific, repeated microbiologic studies and imaging of the head, chest, abdomen and pelvis have shown no infection or cancer and otherwise been unrevealing, and she has failed to respond to all empiric therapies as well as attempts at artificial nutrition.  Regardless of the true pathophysiology of her disease however, her nutritional and functional status have now deteriorated to a degree that I have no doubt she will likely pass away in a short time.  I will attempt to relieve her dyspnea with Lasix.  I will attempt to provide nutrition via NG tube.  Family will attend at the bedside.  I do not believe she is  in significant discomfort.       DVT prophylaxis: Lovenox Code Status: FULL Family Communication: Daughter    Consultants:   Neurology  Pulmonology  Cardiology  Infectious  disease  Palliative care  Procedures:   10/21 CT chest without contrast -- LLL collapse, L mainstem down, large effusions  10/22 attempted thoracentesis -- failed  10/23 CT head code stroke -- generalized atrophy  10/23 MRI brain -- MCA stroke  10/23 CTA head and neck -- L M3 occlusion  10/25 NG tube placement  Antimicrobials:   Flagyl 10/21 >> 10/26  Cephalexin x1 10/22         Cefepime 10/24 >> 10/25                               Zosyn 10/25 >>                                   Oral Vanc 10/26 >>   Subjective: Respiratory distress overnight, improved with IV Lasix.  Still tachycardic and tachypneic, but improved.  No fever.  Otherwise no nursing concerns.  The patient is not responsive or able to make needs known.  Objective: Vitals:   02/20/19 0600 02/20/19 0626 02/20/19 0656 02/20/19 0820  BP:  131/85 131/81   Pulse:  (!) 108 (!) 108   Resp: (!) 27 (!) 29 (!) 29   Temp:      TempSrc:      SpO2: 98% 98% 98% 97%  Weight:      Height:        Intake/Output Summary (Last 24 hours) at 02/20/2019 1105 Last data filed at 02/19/2019 2114 Gross per 24 hour  Intake 134.53 ml  Output 200 ml  Net -65.47 ml   Filed Weights   02/06/2019 1052  Weight: 46.9 kg    Examination: General appearance: Cachectic adult female, lying in bed, appears frail, not responsive.   HEENT: Anicteric, conjunctiva watery, lids and lashes atrophic. No nasal deformity, discharge, epistaxis.  Lips dry, dentition crusted, oropharynx dry, does not open mouth examination. Skin: Dry and tented. Cardiac: Tachycardic, regular, I do not appreciate rales, JVP not visible, no lower extremity edema, radial pulses weak and thready. Respiratory: Tachypneic, shallow, coarse rales bilaterally.   Abdomen: Abdomen soft.  No grimace to palpation, moderate ascites, no distention.    MSK: Diffuse loss of subcutaneous muscle mass and fat, thenar wasting, temporal wasting. Neuro: Does not follow commands.   Eyes open, pupils sluggishly reactive, does not withdraw from pain.    Psych: Unable to assess.    Data Reviewed: I have personally reviewed following labs and imaging studies:  CBC: Recent Labs  Lab 02/17/19 0519 02/17/19 1318 02/18/19 0222 02/19/19 0234 02/20/19 0543  WBC 11.4* 13.0* 14.2* 12.3* 13.4*  NEUTROABS 10.4* 12.2*  --   --   --   HGB 9.1* 9.3* 8.1* 8.6* 8.6*  HCT 28.9* 30.8* 26.0* 28.0* 26.6*  MCV 102.1* 106.9* 105.3* 106.1* 101.1*  PLT 152 143* 134* 131* [id number]*   Basic Metabolic Panel: Recent Labs  Lab 02/17/19 0519 02/17/19 1318 02/18/19 0222 02/19/19 0234 02/20/19 0543  NA 141 138 139 143 146*  K 3.2* 3.3* 4.4 4.7 3.5  CL 109 110 113* 117* 118*  CO2 20* 17* 17* 20* 18*  GLUCOSE 73 148* 118* 118* 127*  BUN 32* 30* 27* 23 22  CREATININE 1.20* 1.16* 0.96 1.07* 1.33*  CALCIUM 8.4* 8.0* 7.7* 8.0* 8.3*  MG 1.9 2.1 1.8  --   --    GFR: Estimated Creatinine Clearance: 22.5 mL/min (A) (by C-G formula based on SCr of 1.33 mg/dL (H)). Liver Function Tests: Recent Labs  Lab 02/16/19 0505 02/17/19 0519 02/17/19 1318 02/20/19 0543  AST 31 22 23 19   ALT 9 7 6 6   ALKPHOS 102 92 89 95  BILITOT 1.8* 1.1 1.3* 1.4*  PROT 6.3* 5.8* 6.1* 5.9*  ALBUMIN 2.4* 2.3* 2.4* 2.4*   No results for input(s): LIPASE, AMYLASE in the last 168 hours. Recent Labs  Lab 02/17/19 1318  AMMONIA 15   Coagulation Profile: Recent Labs  Lab 02/18/19 0222  INR 1.4*   Cardiac Enzymes: No results for input(s): CKTOTAL, CKMB, CKMBINDEX, TROPONINI in the last 168 hours. BNP (last 3 results) No results for input(s): PROBNP in the last 8760 hours. HbA1C: Recent Labs    02/18/19 0222  HGBA1C 5.0   CBG: Recent Labs  Lab 02/18/19 2320 02/19/19 0406 02/19/19 0737 02/19/19 1149 02/20/19 0825  GLUCAP 96 120* 106* 102* 105*   Lipid Profile: Recent Labs    02/18/19 0222  CHOL 124  HDL 20*  LDLCALC 79  TRIG 123  CHOLHDL 6.2   Thyroid Function Tests: Recent Labs     02/18/19 0222  TSH 1.333  FREET4 1.15*   Anemia Panel: No results for input(s): VITAMINB12, FOLATE, FERRITIN, TIBC, IRON, RETICCTPCT in the last 72 hours. Urine analysis:    Component Value Date/Time   COLORURINE YELLOW 02/17/2019 0251   APPEARANCEUR HAZY (A) 02/17/2019 0251   LABSPEC 1.017 02/17/2019 0251   PHURINE 5.0 02/17/2019 0251   GLUCOSEU NEGATIVE 02/17/2019 0251   HGBUR LARGE (A) 02/17/2019 0251   BILIRUBINUR NEGATIVE 02/17/2019 0251   KETONESUR 5 (A) 02/17/2019 0251   PROTEINUR 30 (A) 02/17/2019 0251   UROBILINOGEN 0.2 04-Jul-202014 1048   NITRITE NEGATIVE 02/17/2019 0251   LEUKOCYTESUR MODERATE (A) 02/17/2019 0251   Sepsis Labs: @LABRCNTIP (procalcitonin:4,lacticacidven:4)  ) Recent Results (from the past 240 hour(s))  SARS CORONAVIRUS 2 (TAT 6-24 HRS) Nasopharyngeal Nasopharyngeal Swab     Status: None   Collection Time: 02/10/2019  1:33 PM   Specimen: Nasopharyngeal Swab  Result Value Ref Range Status   SARS Coronavirus 2 NEGATIVE NEGATIVE Final    Comment: (NOTE) SARS-CoV-2 target nucleic acids are NOT DETECTED. The SARS-CoV-2 RNA is generally detectable in upper and lower respiratory specimens during the acute phase of infection. Negative results do not preclude SARS-CoV-2 infection, do not rule out co-infections with other pathogens, and should not be used as the sole basis for treatment or other patient management decisions. Negative results must be combined with clinical observations, patient history, and epidemiological information. The expected result is Negative. Fact Sheet for Patients: SugarRoll.be Fact Sheet for Healthcare Providers: https://www.woods-mathews.com/ This test is not yet approved or cleared by the [place] FDA and  has been authorized for detection and/or diagnosis of SARS-CoV-2 by FDA under an Emergency Use Authorization (EUA). This EUA will remain  in effect (meaning this test can be used)  for the duration of the COVID-19 declaration under Section 56 4(b)(1) of the Act, 21 U.S.C. section 360bbb-3(b)(1), unless the authorization is terminated or revoked sooner. Performed at [facility], [place] [address] [place], [place] [address]   Culture, Urine     Status: Abnormal   Collection Time: 02/17/19  2:51 AM   Specimen: Urine, Clean Catch  Result Value Ref Range Status   Specimen Description   Final    URINE, CLEAN CATCH Performed at [facility], [place] [address] [place], [place] [address]    Special Requests   Final    NONE Performed at [facility], [place] [address], [place], [place] [address]    Culture >=100,000 COLONIES/mL PSEUDOMONAS AERUGINOSA (A)  Final   Report Status 02/19/2019 FINAL  Final   Organism ID, Bacteria PSEUDOMONAS AERUGINOSA (A)  Final      Susceptibility   Pseudomonas aeruginosa - MIC*    CEFTAZIDIME 16 INTERMEDIATE Intermediate     CIPROFLOXACIN <=0.25 SENSITIVE Sensitive     GENTAMICIN <=1 SENSITIVE Sensitive     IMIPENEM 2 SENSITIVE Sensitive     PIP/TAZO 32 SENSITIVE Sensitive     CEFEPIME INTERMEDIATE Intermediate     * >=100,000 COLONIES/mL PSEUDOMONAS AERUGINOSA  MRSA PCR Screening     Status: None   Collection Time: 02/17/19 12:18 PM   Specimen: Nasal Mucosa; Nasopharyngeal  Result Value Ref Range Status   MRSA by PCR NEGATIVE NEGATIVE Final    Comment:        The GeneXpert MRSA Assay (FDA approved for NASAL specimens only), is one component of a comprehensive MRSA colonization surveillance program. It is not intended to diagnose MRSA infection nor to guide or monitor treatment for MRSA infections. Performed at [facility], [place] [address], [place], [place] [address]          Radiology Studies: Dg Abd 1 View  Result Date: 02/19/2019 CLINICAL DATA:  Enteric tube placement EXAM: ABDOMEN - 1 VIEW COMPARISON:  Abdominal radiograph from earlier today FINDINGS:  Weighted enteric tube tip in distal stomach. Contrast seen within the nondistended gastric lumen. No disproportionately dilated small bowel loops. No evidence of pneumatosis or pneumoperitoneum. Partially visualized left greater than right pleural effusions at the lung bases. IMPRESSION: 1. Weighted enteric tube tip in the distal stomach. 2. Nonobstructive bowel gas pattern. 3. Partially visualized left greater than right pleural effusions at the lung bases. Electronically Signed   By: Ilona Sorrel M.D.   On: 02/19/2019 20:39   Dg Abd 1 View  Result Date: 02/19/2019 CLINICAL DATA:  NG tube repositioning EXAM: ABDOMEN - 1 VIEW COMPARISON:  None. FINDINGS: There is a nasogastric tube entering the stomach and being redirected cephalad with the tip in the distal esophagus directed cephalad. There is no bowel dilatation to suggest obstruction. There is no evidence of pneumoperitoneum, portal venous gas or pneumatosis. There are no pathologic calcifications along the expected course of the ureters. Large left pleural effusion.  Small right pleural effusion. The osseous structures are unremarkable. IMPRESSION: Nasogastric tube entering the stomach and being redirected cephalad with the tip in the distal esophagus directed cephalad. Recommend repositioning prior to use. Electronically Signed   By: Kathreen Devoid   On: 02/19/2019 15:12   Dg Abd 1 View  Result Date: 02/19/2019 CLINICAL DATA:  Nasogastric tube repositioning EXAM: ABDOMEN - 1 VIEW COMPARISON:  None. FINDINGS: There is a nasogastric tube entering the stomach and being redirected cephalad with the tip in the distal esophagus directed cephalad. There is no bowel dilatation to suggest obstruction. There is no evidence of pneumoperitoneum, portal venous gas or pneumatosis. There are no pathologic calcifications along the expected course of the ureters. Large left pleural effusion. Small right pleural effusion. Bibasilar atelectasis. The osseous structures  are unremarkable. IMPRESSION: Nasogastric tube entering the stomach and being redirected cephalad  with the tip in the distal esophagus directed cephalad. Recommend repositioning prior to use. Electronically Signed   By: Kathreen Devoid   On: 02/19/2019 15:11   Dg Abd 1 View  Result Date: 02/19/2019 CLINICAL DATA:  NG tube placed EXAM: ABDOMEN - 1 VIEW COMPARISON:  None. FINDINGS: There is a nasogastric tube entering the stomach and being redirected cephalad with the tip in the distal esophagus directed cephalad. There is no bowel dilatation to suggest obstruction. There is no evidence of pneumoperitoneum, portal venous gas or pneumatosis. There are no pathologic calcifications along the expected course of the ureters. The osseous structures are unremarkable. IMPRESSION: There is a nasogastric tube entering the stomach and being redirected cephalad with the tip in the distal esophagus directed cephalad. Recommend repositioning prior to use. Electronically Signed   By: Kathreen Devoid   On: 02/19/2019 14:59   Dg Chest Port 1 View  Result Date: 02/20/2019 CLINICAL DATA:  Increased shortness of breath EXAM: PORTABLE CHEST 1 VIEW COMPARISON:  02/19/2019 FINDINGS: Feeding catheter has been advanced into the stomach. Large left-sided pleural effusion is again noted with some associated infiltrate. Patchy right perihilar opacities are noted. These are stable in appearance from the prior exam some superimposed vascular congestion is noted. Small right pleural effusion is seen. IMPRESSION: Feeding catheter within the stomach. Large left pleural effusion with bilateral parenchymal opacities. Vascular congestion somewhat increased from the prior study. Electronically Signed   By: Inez Catalina M.D.   On: 02/20/2019 00:40   Dg Chest Port 1 View  Result Date: 02/19/2019 CLINICAL DATA:  Nasogastric tube placement EXAM: PORTABLE CHEST 1 VIEW COMPARISON:  Chest radiograph dated 02/19/2019. FINDINGS: An enteric tube enters  the esophagus and is coiled near the gastroesophageal junction with the tip overlying the upper thoracic esophagus. The heart and mediastinal contours are obscured. A large left pleural effusion with associated atelectasis/airspace disease appears similar to prior exam. Right upper lung interstitial and airspace opacities appear unchanged. A small right pleural effusion is unchanged. Right basilar airspace opacities have increased. IMPRESSION: 1. Enteric tube is coiled in the distal esophagus, with the tip overlying the upper thoracic esophagus. Electronically Signed   By: Zerita Boers M.D.   On: 02/19/2019 18:44   Dg Chest Port 1 View  Result Date: 02/19/2019 CLINICAL DATA:  Hypoxia. EXAM: PORTABLE CHEST 1 VIEW COMPARISON:  02/17/2019 FINDINGS: Heart size is normal. Aortic atherosclerosis. New airspace disease is identified within the right upper lobe. Small to moderate right pleural effusion. Moderate to large left pleural effusion has increased in volume from previous exam significantly diminished aeration to the left lung. IMPRESSION: 1. Increase in volume of pleural effusions, left greater than right. 2. New right upper lobe airspace opacity concerning for pneumonia. Electronically Signed   By: Kerby Moors M.D.   On: 02/19/2019 10:17   Dg Abd Portable 1v  Result Date: 02/19/2019 CLINICAL DATA:  NG tube placement EXAM: PORTABLE ABDOMEN - 1 VIEW COMPARISON:  Earlier same day FINDINGS: There is a nasogastric tube coiled in the distal esophagus with the tip directed cephalad outside the field of view. There is no bowel dilatation to suggest obstruction. There is no evidence of pneumoperitoneum, portal venous gas or pneumatosis. There are no pathologic calcifications along the expected course of the ureters. There is a trace right pleural effusion. There is a moderate left pleural effusion. The osseous structures are unremarkable. IMPRESSION: Nasogastric tube coiled in the distal esophagus with the tip  directed cephalad outside the  field of view. Recommend repositioning the nasogastric tube prior to use. Electronically Signed   By: Kathreen Devoid   On: 02/19/2019 17:13   Dg Loyce Dys Tube Plc W/fl W/rad  Result Date: 02/19/2019 CLINICAL DATA:  Request for NG tube placement. EXAM: NASO G TUBE PLACEMENT WITH FL AND WITH RAD CONTRAST:  20 cc Omnipaque iodinated contrast per NG tube. FLUOROSCOPY TIME:  Fluoroscopy Time:  3 minutes 42 seconds Radiation Exposure Index (if provided by the fluoroscopic device): 20.4 mGy Number of Acquired Spot Images: 0 COMPARISON:  Abdominal radiograph from earlier today. FINDINGS: Technically successful placement of 10 Fr CorTrak nasogastric tube under fluoroscopic guidance, with the tip verified in the gastric antrum. Injected contrast seen within the nondistended stomach. IMPRESSION: Technically successful placement of nasogastric tube under fluoroscopic guidance, with the tip verified in the gastric antrum. Tube is ready for use. Electronically Signed   By: Ilona Sorrel M.D.   On: 02/19/2019 20:38        Scheduled Meds:  aspirin  300 mg Rectal Daily   chlorhexidine  15 mL Mouth Rinse BID   Chlorhexidine Gluconate Cloth  6 each Topical Daily   enoxaparin (LOVENOX) injection  30 mg Subcutaneous Q24H   feeding supplement (OSMOLITE 1.2 CAL)  1,000 mL Per Tube Q24H   latanoprost  1 drop Both Eyes QHS   levalbuterol  0.63 mg Nebulization TID   mouth rinse  15 mL Mouth Rinse q12n4p   Continuous Infusions:  sodium chloride Stopped (02/17/19 1441)   metronidazole 500 mg (02/20/19 1004)   piperacillin-tazobactam (ZOSYN)  IV 3.375 g (02/20/19 0505)     LOS: 5 days    Time spent: 35 minutes    Edwin Dada, MD [facility] 02/20/2019, 11:05 AM     Please page through [place]:  [email or web address] Password [email or web address] If 7PM-7AM, please contact night-coverage

## 2019-02-20 NOTE — Progress Notes (Signed)
SLP Cancellation Note  Patient Details Name: Sydney [name] MRN: [id number] DOB: 1930-12-26   Cancelled treatment:       Reason Eval/Treat Not Completed: Fatigue/lethargy limiting ability to participate. Consulted with Dr. Loleta Books - pt not appropriate for therapy at this time due unresponsiveness and declining medical condition. MD to re-consult [place] at later time if appropriate in future. SLP SIGNING OFF.  Celia B. Quentin Ore, [facility], [place] Speech Language Pathologist Office: [phone] Pager: [phone]  Shonna Chock 02/20/2019, 11:21 AM

## 2019-02-20 NOTE — Progress Notes (Signed)
PT Cancellation Note  Patient Details Name: Sydney [name] MRN: [id number] DOB: [birth year]   Cancelled Treatment:    Reason Eval/Treat Not Completed: PT screened, no needs identified, will sign off. Per Dr. Loleta Books pt not appropriate for therapy at this time due unresponsiveness and declining medical condition. MD to re-consult [place] at later time if appropriate in future. PT SIGNING OFF.   Oreoluwa Aigner M Tome Wilson 02/20/2019, 11:13 AM

## 2019-02-20 NOTE — Progress Notes (Signed)
LTM EEG hooked up and running. Same Leads used  - no initial skin breakdown - push button tested - neuro notified.

## 2019-02-20 NOTE — Progress Notes (Deleted)
Cardiology Office Note:   Date:  02/20/2019  NAME:  Sydney [name]    MRN: [id number] DOB:  1930-10-25   PCP:  [name], MD  Cardiologist:  Quay Burow, MD  Electrophysiologist:  None   Referring MD: Gareth Morgan, MD   No chief complaint on file. ***  History of Present Illness:   Sydney [name] is a 85 y.o. female with a hx of *** who is being seen today for the evaluation of *** at the request of ***.  Past Medical History: Past Medical History:  Diagnosis Date  . Anemia    years ago  . Aortic atherosclerosis ([place])   . Arthritis    "left hand" (02/17/2013)  . Bilateral inguinal hernia    INCIDENTAL CT 1/20  . C. difficile diarrhea   . Cataract   . Dehydration 12/29/2018  . Diplopia    CRANIAL 6 NERVE PALSY  . Dyslipidemia   . Gastritis   . Glaucoma   . Hypertension   . Leukopenia   . Meningioma ([place]) 05/01/2015  . Osteoporosis 02/2014   T score -2.5  followed by Dr. Lysle Rubens  . Palpitations    PACs, PVCs and short runs of atrial tachycardia on Holter monitoring  . PVD (peripheral vascular disease) ([place])   . Rhinitis   . Syncopal episodes 2003  . TIA (transient ischemic attack) 1990's  . Trigger thumb of left hand     Past Surgical History: Past Surgical History:  Procedure Laterality Date  . ANAL FISTULECTOMY  1970  . COLONOSCOPY    . COLONOSCOPY WITH PROPOFOL N/A 12/02/2016   Procedure: COLONOSCOPY WITH PROPOFOL;  Surgeon: Gatha Mayer, MD;  Location: [facility] ENDOSCOPY;  Service: Endoscopy;  Laterality: N/A;  . 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 TONSILLECTOMY      Current Medications: No outpatient medications have been marked as taking for the 02/21/19 encounter (Appointment) with O'Neal, Cassie Freer, MD.     Allergies:    Demerol [meperidine], Other, Codeine, Sulfa antibiotics, and Tomato   Social History:  Social History   Socioeconomic History  . Marital status: Widowed    Spouse name: Not on file  . Number of children: 1  . Years of education: 27  . Highest education level: Not on file  Occupational History  . Occupation: retired  Scientific laboratory technician  . Financial resource strain: Not on file  . Food insecurity    Worry: Not on file    Inability: Not on file  . Transportation needs    Medical: Not on file    Non-medical: Not on file  Tobacco Use  . Smoking status: Never Smoker  . Smokeless tobacco: Never Used  Substance and Sexual Activity  . Alcohol use: No  . Drug use: No  . Sexual activity: Not Currently    Comment: 1st intercourse 85 yo-Fewer than 5 partners  Lifestyle  . Physical activity    Days per week: Not on file    Minutes per session: Not on file  . Stress: Not on file  Relationships  . Social Herbalist on phone: Not on file    Gets together: Not on file    Attends religious service: Not on file    Active member of club or organization: Not on file    Attends meetings of clubs or organizations: Not on file  Relationship status: Not on file  Other Topics Concern  . Not on file  Social History Narrative   Patient does not drink caffeine.   Patient is right handed.      Family History: The patient's ***family history includes Heart attack in her father; Hypertension in her father; Stroke in her brother and maternal grandmother.  ROS:   All other ROS reviewed and negative. Pertinent positives noted in the HPI.     EKGs/Labs/Other Studies Reviewed:   The following studies were personally reviewed by me today:  EKG:  EKG is *** ordered today.  The ekg ordered today demonstrates ***, and was personally reviewed by me.   Recent Labs: 02/18/2019: Magnesium 1.8; TSH 1.333 02/20/2019: ALT 6; BUN 22; Creatinine, Ser 1.33; Hemoglobin 8.6; Platelets 120; Potassium 3.5; Sodium 146   Recent Lipid Panel    Component Value Date/Time   CHOL 124  02/18/2019 0222   TRIG 123 02/18/2019 0222   HDL 20 (L) 02/18/2019 0222   CHOLHDL 6.2 02/18/2019 0222   VLDL 25 02/18/2019 0222   LDLCALC 79 02/18/2019 0222    Physical Exam:   VS:  There were no vitals taken for this visit.   Wt Readings from Last 3 Encounters:  02/01/2019 103 lb 6.3 oz (46.9 kg)  02/09/19 103 lb 6.4 oz (46.9 kg)  01/25/19 133 lb 2.5 oz (60.4 kg)    General: Well nourished, well developed, in no acute distress Heart: Atraumatic, normal size  Eyes: PEERLA, EOMI  Neck: Supple, no JVD Endocrine: No thryomegaly Cardiac: Normal S1, S2; RRR; no murmurs, rubs, or gallops Lungs: Clear to auscultation bilaterally, no wheezing, rhonchi or rales  Abd: Soft, nontender, no hepatomegaly  Ext: No edema, pulses 2+ Musculoskeletal: No deformities, BUE and BLE strength normal and equal Skin: Warm and dry, no rashes   Neuro: Alert and oriented to person, place, time, and situation, CNII-XII grossly intact, no focal deficits  Psych: Normal mood and affect   ASSESSMENT:   Sydney [name] is a 85 y.o. female who presents for the following: No diagnosis found.  PLAN:   There are no diagnoses linked to this encounter.  Disposition: No follow-ups on file.  Medication Adjustments/Labs and Tests Ordered: Current medicines are reviewed at length with the patient today.  Concerns regarding medicines are outlined above.  No orders of the defined types were placed in this encounter.  No orders of the defined types were placed in this encounter.   There are no Patient Instructions on file for this visit.   Signed, Addison Naegeli. Audie Box, [place]  [address], [place], [place] [address] [phone]  02/20/2019 7:11 AM

## 2019-02-20 NOTE — Progress Notes (Signed)
Tube feeding started at this time.

## 2019-02-20 NOTE — Progress Notes (Signed)
   Vital Signs MEWS/VS Documentation      02/20/2019 0820 02/20/2019 1428 02/20/2019 1701 02/20/2019 1707   MEWS Score:  -  -  4  3   MEWS Score Color:  -  -  Red  Yellow   Resp:  -  -  (!) 24  -   Pulse:  -  -  (!) 106  -   BP:  -  -  123/83  -   Temp:  -  -  98.5 F (36.9 C)  -   O2 Device:  HFNC  HFNC  Nasal Cannula  -   O2 Flow Rate (L/min):  3 L/min  3 L/min  3 L/min  -      No acute change in patient's condition at this time     Rance Muir 02/20/2019,6:44 PM

## 2019-02-20 NOTE — Progress Notes (Signed)
[facility] for Infectious Disease    Date of Admission:  02/18/2019     ID: Sydney [name] is a 85 y.o. female with large Left MCA stroke in setting of severe-protein caloric malnutrition Active Problems:   Meningioma (HCC)   Hypokalemia   Anemia   Glaucoma   Physical deconditioning   History of Clostridioides difficile colitis   Protein-calorie malnutrition, severe   Failure to thrive in adult   Sacral ulcer ([place])   HCAP (healthcare-associated pneumonia)   Pleural effusion   Acute ischemic left MCA stroke (HCC)   Atrial fibrillation with RVR (HCC)   SVT (supraventricular tachycardia) (HCC)   Metabolic acidosis   Mucus plugging of bronchi   Cardiomyopathy ([place])    Subjective: Nonverbal, since onset of stroke, eyes closed getting attached for EEg monitoring. Her daughter at her bedside mentioned that she nodded her head when asked to recognize her daughter yesterday  Medications:   aspirin  300 mg Rectal Daily   chlorhexidine  15 mL Mouth Rinse BID   Chlorhexidine Gluconate Cloth  6 each Topical Daily   enoxaparin (LOVENOX) injection  30 mg Subcutaneous Q24H   feeding supplement (OSMOLITE 1.2 CAL)  1,000 mL Per Tube Q24H   latanoprost  1 drop Both Eyes QHS   levalbuterol  0.63 mg Nebulization TID   mouth rinse  15 mL Mouth Rinse q12n4p   vancomycin  125 mg Oral QID    Objective: Vital signs in last 24 hours: Temp:  [98.4 F (36.9 C)-98.9 F (37.2 C)] 98.9 F (37.2 C) (10/26 0318) Pulse Rate:  [18-133] 108 (10/26 0656) Resp:  [21-33] 29 (10/26 0656) BP: (114-149)/(73-115) 131/81 (10/26 0656) SpO2:  [86 %-100 %] 95 % (10/26 1428) FiO2 (%):  [5 %] 5 % (10/26 0318) Physical Exam  Constitutional:  Appears chronicall ill, emaciated  No distress.  HENT: [place]/AT, bitemporal wasting Mouth/Throat: Oropharynx is clear and moist. Dry oral pharyx Cardiovascular: Normal rate, regular rhythm and normal heart sounds. Exam reveals no gallop and no friction rub.    No murmur heard.  Pulmonary/Chest: Effort normal and breath sounds normal. No respiratory distress.  has no wheezes.  Abdominal: Soft. Bowel sounds are soft.  exhibits no distension. There is no tenderness.  Ext: pitting edema to legs bilaterally Neurological: does not follow commands Lab Results Recent Labs    02/19/19 0234 02/20/19 0543  WBC 12.3* 13.4*  HGB 8.6* 8.6*  HCT 28.0* 26.6*  NA 143 146*  K 4.7 3.5  CL 117* 118*  CO2 20* 18*  BUN 23 22  CREATININE 1.07* 1.33*   Liver Panel Recent Labs    02/20/19 0543  PROT 5.9*  ALBUMIN 2.4*  AST 19  ALT 6  ALKPHOS 95  BILITOT 1.4*   cxr per my read patchy multifocal infiltrate with left sided effusion Microbiology: psa uti Studies/Results: Dg Abd 1 View  Result Date: 02/19/2019 CLINICAL DATA:  Enteric tube placement EXAM: ABDOMEN - 1 VIEW COMPARISON:  Abdominal radiograph from earlier today FINDINGS: Weighted enteric tube tip in distal stomach. Contrast seen within the nondistended gastric lumen. No disproportionately dilated small bowel loops. No evidence of pneumatosis or pneumoperitoneum. Partially visualized left greater than right pleural effusions at the lung bases. IMPRESSION: 1. Weighted enteric tube tip in the distal stomach. 2. Nonobstructive bowel gas pattern. 3. Partially visualized left greater than right pleural effusions at the lung bases. Electronically Signed   By: Ilona Sorrel M.D.   On: 02/19/2019 20:39  Dg Abd 1 View  Result Date: 02/19/2019 CLINICAL DATA:  NG tube repositioning EXAM: ABDOMEN - 1 VIEW COMPARISON:  None. FINDINGS: There is a nasogastric tube entering the stomach and being redirected cephalad with the tip in the distal esophagus directed cephalad. There is no bowel dilatation to suggest obstruction. There is no evidence of pneumoperitoneum, portal venous gas or pneumatosis. There are no pathologic calcifications along the expected course of the ureters. Large left pleural effusion.  Small  right pleural effusion. The osseous structures are unremarkable. IMPRESSION: Nasogastric tube entering the stomach and being redirected cephalad with the tip in the distal esophagus directed cephalad. Recommend repositioning prior to use. Electronically Signed   By: Kathreen Devoid   On: 02/19/2019 15:12   Dg Abd 1 View  Result Date: 02/19/2019 CLINICAL DATA:  Nasogastric tube repositioning EXAM: ABDOMEN - 1 VIEW COMPARISON:  None. FINDINGS: There is a nasogastric tube entering the stomach and being redirected cephalad with the tip in the distal esophagus directed cephalad. There is no bowel dilatation to suggest obstruction. There is no evidence of pneumoperitoneum, portal venous gas or pneumatosis. There are no pathologic calcifications along the expected course of the ureters. Large left pleural effusion. Small right pleural effusion. Bibasilar atelectasis. The osseous structures are unremarkable. IMPRESSION: Nasogastric tube entering the stomach and being redirected cephalad with the tip in the distal esophagus directed cephalad. Recommend repositioning prior to use. Electronically Signed   By: Kathreen Devoid   On: 02/19/2019 15:11   Dg Abd 1 View  Result Date: 02/19/2019 CLINICAL DATA:  NG tube placed EXAM: ABDOMEN - 1 VIEW COMPARISON:  None. FINDINGS: There is a nasogastric tube entering the stomach and being redirected cephalad with the tip in the distal esophagus directed cephalad. There is no bowel dilatation to suggest obstruction. There is no evidence of pneumoperitoneum, portal venous gas or pneumatosis. There are no pathologic calcifications along the expected course of the ureters. The osseous structures are unremarkable. IMPRESSION: There is a nasogastric tube entering the stomach and being redirected cephalad with the tip in the distal esophagus directed cephalad. Recommend repositioning prior to use. Electronically Signed   By: Kathreen Devoid   On: 02/19/2019 14:59   Dg Chest Port 1  View  Result Date: 02/20/2019 CLINICAL DATA:  Increased shortness of breath EXAM: PORTABLE CHEST 1 VIEW COMPARISON:  02/19/2019 FINDINGS: Feeding catheter has been advanced into the stomach. Large left-sided pleural effusion is again noted with some associated infiltrate. Patchy right perihilar opacities are noted. These are stable in appearance from the prior exam some superimposed vascular congestion is noted. Small right pleural effusion is seen. IMPRESSION: Feeding catheter within the stomach. Large left pleural effusion with bilateral parenchymal opacities. Vascular congestion somewhat increased from the prior study. Electronically Signed   By: Inez Catalina M.D.   On: 02/20/2019 00:40   Dg Chest Port 1 View  Result Date: 02/19/2019 CLINICAL DATA:  Nasogastric tube placement EXAM: PORTABLE CHEST 1 VIEW COMPARISON:  Chest radiograph dated 02/19/2019. FINDINGS: An enteric tube enters the esophagus and is coiled near the gastroesophageal junction with the tip overlying the upper thoracic esophagus. The heart and mediastinal contours are obscured. A large left pleural effusion with associated atelectasis/airspace disease appears similar to prior exam. Right upper lung interstitial and airspace opacities appear unchanged. A small right pleural effusion is unchanged. Right basilar airspace opacities have increased. IMPRESSION: 1. Enteric tube is coiled in the distal esophagus, with the tip overlying the upper thoracic esophagus. Electronically  Signed   By: Zerita Boers M.D.   On: 02/19/2019 18:44   Dg Chest Port 1 View  Result Date: 02/19/2019 CLINICAL DATA:  Hypoxia. EXAM: PORTABLE CHEST 1 VIEW COMPARISON:  02/17/2019 FINDINGS: Heart size is normal. Aortic atherosclerosis. New airspace disease is identified within the right upper lobe. Small to moderate right pleural effusion. Moderate to large left pleural effusion has increased in volume from previous exam significantly diminished aeration to the left  lung. IMPRESSION: 1. Increase in volume of pleural effusions, left greater than right. 2. New right upper lobe airspace opacity concerning for pneumonia. Electronically Signed   By: Kerby Moors M.D.   On: 02/19/2019 10:17   Dg Abd Portable 1v  Result Date: 02/19/2019 CLINICAL DATA:  NG tube placement EXAM: PORTABLE ABDOMEN - 1 VIEW COMPARISON:  Earlier same day FINDINGS: There is a nasogastric tube coiled in the distal esophagus with the tip directed cephalad outside the field of view. There is no bowel dilatation to suggest obstruction. There is no evidence of pneumoperitoneum, portal venous gas or pneumatosis. There are no pathologic calcifications along the expected course of the ureters. There is a trace right pleural effusion. There is a moderate left pleural effusion. The osseous structures are unremarkable. IMPRESSION: Nasogastric tube coiled in the distal esophagus with the tip directed cephalad outside the field of view. Recommend repositioning the nasogastric tube prior to use. Electronically Signed   By: Kathreen Devoid   On: 02/19/2019 17:13   Dg Loyce Dys Tube Plc W/fl W/rad  Result Date: 02/19/2019 CLINICAL DATA:  Request for NG tube placement. EXAM: NASO G TUBE PLACEMENT WITH FL AND WITH RAD CONTRAST:  20 cc Omnipaque iodinated contrast per NG tube. FLUOROSCOPY TIME:  Fluoroscopy Time:  3 minutes 42 seconds Radiation Exposure Index (if provided by the fluoroscopic device): 20.4 mGy Number of Acquired Spot Images: 0 COMPARISON:  Abdominal radiograph from earlier today. FINDINGS: Technically successful placement of 10 Fr CorTrak nasogastric tube under fluoroscopic guidance, with the tip verified in the gastric antrum. Injected contrast seen within the nondistended stomach. IMPRESSION: Technically successful placement of nasogastric tube under fluoroscopic guidance, with the tip verified in the gastric antrum. Tube is ready for use. Electronically Signed   By: Ilona Sorrel M.D.   On: 02/19/2019  20:38     Assessment/Plan: Hx of cdifficile with hx of remote fecal tx = currently on proph dosing since has been receiving IV abtx. Continue on oral vanco 125mg  qid  Patchy infiltrates = thought to be more fluid overload rather than aspiration pneumonia. Primary team is attempting to diurese  Pseudomonal uti = has received 3 days of cefepime. Suspect she maybe colonized. Recommend to discontinue iv cefepime, monitor off of abtx  Overall prognosis = poor agree with palliative care consultation and [place] discussion  [facility] for Infectious Diseases Cell: [phone] Pager: [phone]  02/20/2019, 2:36 PM

## 2019-02-20 NOTE — Progress Notes (Signed)
OT Screen Note  Patient Details Name: Sydney [name] MRN: [id number] DOB: [birth year]   Cancelled Treatment:    Reason Eval/Treat Not Completed: Patient declined, no reason specified per Dr Loleta Books patient is not appropriate at ths time for therapy due to unresponsiveness and declining medical condition. Ot to sign off acutely  Richelle Ito, OTR/L  Acute Rehabilitation Services Pager: [phone] Office: [phone] .  02/20/2019, 11:38 AM

## 2019-02-21 ENCOUNTER — Ambulatory Visit: Payer: Medicare Other | Admitting: Cardiovascular Disease

## 2019-02-21 DIAGNOSIS — J90 Pleural effusion, not elsewhere classified: Secondary | ICD-10-CM | POA: Diagnosis not present

## 2019-02-21 DIAGNOSIS — I63512 Cerebral infarction due to unspecified occlusion or stenosis of left middle cerebral artery: Secondary | ICD-10-CM | POA: Diagnosis not present

## 2019-02-21 DIAGNOSIS — I4891 Unspecified atrial fibrillation: Secondary | ICD-10-CM | POA: Diagnosis not present

## 2019-02-21 DIAGNOSIS — R627 Adult failure to thrive: Secondary | ICD-10-CM | POA: Diagnosis not present

## 2019-02-21 DIAGNOSIS — R4182 Altered mental status, unspecified: Secondary | ICD-10-CM | POA: Diagnosis not present

## 2019-02-21 DIAGNOSIS — D649 Anemia, unspecified: Secondary | ICD-10-CM | POA: Diagnosis not present

## 2019-02-21 LAB — COMPREHENSIVE METABOLIC PANEL
ALT: 7 U/L (ref 0–44)
AST: 22 U/L (ref 15–41)
Albumin: 2 g/dL — ABNORMAL LOW (ref 3.5–5.0)
Alkaline Phosphatase: 174 U/L — ABNORMAL HIGH (ref 38–126)
Anion gap: 10 (ref 5–15)
BUN: 31 mg/dL — ABNORMAL HIGH (ref 8–23)
CO2: 17 mmol/L — ABNORMAL LOW (ref 22–32)
Calcium: 8.1 mg/dL — ABNORMAL LOW (ref 8.9–10.3)
Chloride: 118 mmol/L — ABNORMAL HIGH (ref 98–111)
Creatinine, Ser: 1.71 mg/dL — ABNORMAL HIGH (ref 0.44–1.00)
GFR calc Af Amer: 31 mL/min — ABNORMAL LOW (ref >60)
GFR calc non Af Amer: 27 mL/min — ABNORMAL LOW (ref >60)
Glucose, Bld: 203 mg/dL — ABNORMAL HIGH (ref 70–99)
Potassium: 3.2 mmol/L — ABNORMAL LOW (ref 3.5–5.1)
Sodium: 145 mmol/L (ref 135–145)
Total Bilirubin: 0.6 mg/dL (ref 0.3–1.2)
Total Protein: 5.5 g/dL — ABNORMAL LOW (ref 6.5–8.1)

## 2019-02-21 LAB — CBC
HCT: 29.5 % — ABNORMAL LOW (ref 36.0–46.0)
Hemoglobin: 9.6 g/dL — ABNORMAL LOW (ref 12.0–15.0)
MCH: 33 pg (ref 26.0–34.0)
MCHC: 32.5 g/dL (ref 30.0–36.0)
MCV: 101.4 fL — ABNORMAL HIGH (ref 80.0–100.0)
Platelets: 123 10*3/uL — ABNORMAL LOW (ref 150–400)
RBC: 2.91 MIL/uL — ABNORMAL LOW (ref 3.87–5.11)
RDW: 22.6 % — ABNORMAL HIGH (ref 11.5–15.5)
WBC: 13.6 10*3/uL — ABNORMAL HIGH (ref 4.0–10.5)
nRBC: 0.4 % — ABNORMAL HIGH (ref 0.0–0.2)

## 2019-02-21 LAB — GLUCOSE, CAPILLARY
Glucose-Capillary: 202 mg/dL — ABNORMAL HIGH (ref 70–99)
Glucose-Capillary: 161 mg/dL — ABNORMAL HIGH (ref 70–99)
Glucose-Capillary: 181 mg/dL — ABNORMAL HIGH (ref 70–99)

## 2019-02-21 MED ORDER — VANCOMYCIN 50 MG/ML ORAL SOLUTION
125.0000 mg | Freq: Four times a day (QID) | ORAL | Status: AC
  Administered 2019-02-21 – 2019-02-22 (×5): 125 mg via ORAL
  Filled 2019-02-21 (×8): qty 2.5

## 2019-02-21 MED ORDER — SODIUM CHLORIDE 0.9 % IV BOLUS
250.0000 mL | Freq: Once | INTRAVENOUS | Status: AC
  Administered 2019-02-21: 250 mL via INTRAVENOUS

## 2019-02-21 MED ORDER — LEVALBUTEROL HCL 0.63 MG/3ML IN NEBU
0.6300 mg | INHALATION_SOLUTION | Freq: Two times a day (BID) | RESPIRATORY_TRACT | Status: DC
  Administered 2019-02-22 – 2019-02-23 (×3): 0.63 mg via RESPIRATORY_TRACT
  Filled 2019-02-21 (×3): qty 3

## 2019-02-21 MED ORDER — FUROSEMIDE 10 MG/ML IJ SOLN
40.0000 mg | Freq: Two times a day (BID) | INTRAMUSCULAR | Status: DC
  Administered 2019-02-21: 40 mg via INTRAVENOUS
  Filled 2019-02-21: qty 4

## 2019-02-21 MED ORDER — ALBUMIN HUMAN 25 % IV SOLN
25.0000 g | Freq: Two times a day (BID) | INTRAVENOUS | Status: DC
  Administered 2019-02-21 – 2019-02-22 (×2): 25 g via INTRAVENOUS
  Filled 2019-02-21 (×2): qty 100

## 2019-02-21 MED ORDER — OSMOLITE 1.2 CAL PO LIQD
1000.0000 mL | ORAL | Status: DC
  Administered 2019-02-21: 1000 mL
  Filled 2019-02-21 (×4): qty 1000

## 2019-02-21 MED ORDER — POTASSIUM CHLORIDE 20 MEQ PO PACK
40.0000 meq | PACK | Freq: Two times a day (BID) | ORAL | Status: AC
  Administered 2019-02-21 (×2): 40 meq via ORAL
  Filled 2019-02-21 (×2): qty 2

## 2019-02-21 MED ORDER — HYDROMORPHONE HCL 1 MG/ML IJ SOLN
0.4000 mg | INTRAMUSCULAR | Status: DC | PRN
  Administered 2019-02-21: 0.4 mg via INTRAVENOUS
  Filled 2019-02-21: qty 0.5

## 2019-02-21 MED ORDER — PRO-STAT SUGAR FREE PO LIQD
30.0000 mL | Freq: Every day | ORAL | Status: DC
  Administered 2019-02-21 – 2019-02-22 (×2): 30 mL
  Filled 2019-02-21 (×3): qty 30

## 2019-02-21 MED ORDER — ALBUMIN HUMAN 25 % IV SOLN
12.5000 g | Freq: Once | INTRAVENOUS | Status: AC
  Administered 2019-02-21: 12.5 g via INTRAVENOUS
  Filled 2019-02-21: qty 50

## 2019-02-21 MED ORDER — ACETAMINOPHEN 650 MG RE SUPP
650.0000 mg | Freq: Four times a day (QID) | RECTAL | Status: DC
  Administered 2019-02-21 – 2019-02-23 (×7): 650 mg via RECTAL
  Filled 2019-02-21 (×6): qty 1

## 2019-02-21 NOTE — Progress Notes (Signed)
EEG maintenance. No skin breakdown at [facility] FP2. Continue to monitor

## 2019-02-21 NOTE — Procedures (Addendum)
Patient Name: Sydney [name]  MRN: [id number]  Epilepsy Attending: Lora Havens  Referring Physician/Provider: Dr Rogue Jury Duration: 02/20/2019 1222 to 02/21/2019 1330  Patient history: 85 year old female with left MCA stroke and altered mental status.  EEG to evaluate for seizures.  Level of alertness: lethargic/confused, asleep  AEDs during EEG study:None  Technical aspects: This EEG study was done with scalp electrodes positioned according to the 10-20 International system of electrode placement. Electrical activity was acquired at a sampling rate of 500Hz  and reviewed with a high frequency filter of 70Hz  and a low frequency filter of 1Hz . EEG data were recorded continuously and digitally stored.  Description: EEG showed continuous generalized polymorphic 2 to 5 Hz theta-delta slowing admixed with 13 to 15 Hz beta activity. No clear posterior dominant rhythm was seen. Sleep was characterized by diffuse slowing.    Abnormality -Continuous slow, generalized  IMPRESSION: This study is suggestive of moderate to severe diffuse encephalopathy, nonspecific to etiology.No seizures or epileptiform discharges were seen throughout the recording.  Gabrial Poppell Barbra Sarks

## 2019-02-21 NOTE — Evaluation (Signed)
Clinical/Bedside Swallow Evaluation Patient Details  Name: Sydney [name] MRN: [id number] Date of Birth: 07-27-1930  Today's Date: 02/21/2019 Time: SLP Start Time (ACUTE ONLY): 51 SLP Stop Time (ACUTE ONLY): 1600 SLP Time Calculation (min) (ACUTE ONLY): 50 min  Past Medical History:  Past Medical History:  Diagnosis Date  . Anemia    years ago  . Aortic atherosclerosis ([place])   . Arthritis    "left hand" (02/17/2013)  . Bilateral inguinal hernia    INCIDENTAL CT 1/20  . C. difficile diarrhea   . Cataract   . Dehydration 12/29/2018  . Diplopia    CRANIAL 6 NERVE PALSY  . Dyslipidemia   . Gastritis   . Glaucoma   . Hypertension   . Leukopenia   . Meningioma ([place]) 05/01/2015  . Osteoporosis 02/2014   T score -2.5  followed by Dr. Lysle Rubens  . Palpitations    PACs, PVCs and short runs of atrial tachycardia on Holter monitoring  . PVD (peripheral vascular disease) ([place])   . Rhinitis   . Syncopal episodes 2003  . TIA (transient ischemic attack) 1990's  . Trigger thumb of left hand    Past Surgical History:  Past Surgical History:  Procedure Laterality Date  . ANAL FISTULECTOMY  1970  . COLONOSCOPY    . COLONOSCOPY WITH PROPOFOL N/A 12/02/2016   Procedure: COLONOSCOPY WITH PROPOFOL;  Surgeon: Gatha Mayer, MD;  Location: [facility] ENDOSCOPY;  Service: Endoscopy;  Laterality: N/A;  . 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 TONSILLECTOMY     HPI:  85 y.o. 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   Assessment / Plan / Recommendation Clinical Impression  Orders received due to family request for re-evaluation. Pt was seen at bedside for assessment of swallow function and safety. Pt  allowed oral care with suction. Oral cavity was noted to be dry, due to open mouth breathing. Following oral care, pt was given a small piece of ice. No oral manipulation was noted. No swallow reflex elicited. Pt was then given a larger piece of ice, which resulted in the same response. No oral manipulation, no swallow reflex elicited. Oral cavity was suctioned, and no water was removed, suggesting  spillage of melted ice over the tongue base. In the absence of a swallow reflex, there is a significant risk that anything given po will spill into the open airway and be aspirated. This information was shared with pt's daughter, with education regarding basic swallow function, importance of oral hygiene, and high aspiration risk.    Strict NPO status is recommended, as pt is unable to demonstrate swallow safety or follow directions to protect airway.  Pt has not met nutritional needs in some time, and is currently at significantly high risk for aspiration. Education has been thoroughly completed with pt's daughter. No further ST intervention is warranted, as significant improvement in pt status is unlikely.  Daughter was also informed that cog/com evaluation was not completed, as pt is currently unresponsive to questions, she is nonvocal, and does not follow commands. ST signing off.   SLP Visit Diagnosis: Dysphagia, unspecified (R13.10)    Aspiration Risk  Risk for inadequate nutrition/hydration;Severe aspiration risk    Diet Recommendation NPO   Medication Administration:  Via alternative means    Other  Recommendations Oral Care Recommendations: Staff/trained caregiver to provide oral care Other Recommendations: Have oral suction available   Follow up Recommendations None          Prognosis Prognosis for Safe Diet Advancement: Guarded Barriers to Reach Goals: Behavior;Severity of deficits      Swallow Study   General Date of Onset: 02/20/2019 HPI: 85 y.o. female with medical history  significant of TIA, sacral pressure ulcer, C. difficile colitis status post stool transplant, failure to thrive, hypertension came in with complaints of low oxygen saturation from the wound clinic. Type of Study: Bedside Swallow Evaluation Previous Swallow Assessment: BSE 10/15 - Diet Prior to this Study: NPO Temperature Spikes Noted: No Respiratory Status: Nasal cannula History of Recent Intubation: No Behavior/Cognition: Alert;Uncooperative;Doesn't follow directions Oral Cavity Assessment: Dry Oral Care Completed by SLP: Yes Oral Cavity - Dentition: (edentulous upper) Self-Feeding Abilities: Total assist Patient Positioning: Postural control adequate for testing Baseline Vocal Quality: Aphonic Volitional Cough: Cognitively unable to elicit Volitional Swallow: Unable to elicit    Oral/Motor/Sensory Function Overall Oral Motor/Sensory Function: Other (comment)(unable to assess)   Ice Chips Ice chips: Impaired Presentation: Spoon Oral Phase Impairments: Poor awareness of bolus;Impaired mastication;Reduced lingual movement/coordination;Reduced labial seal Oral Phase Functional Implications: Oral holding Pharyngeal Phase Impairments: Unable to trigger swallow   Thin Liquid Thin Liquid: Not tested    Nectar Thick Nectar Thick Liquid: Not tested   Honey Thick Honey Thick Liquid: Not tested   Puree Puree: Not tested   Solid     Solid: Not tested      B. Quentin Ore, [facility], [place] Speech Language Pathologist Office: [phone] Pager: [phone]  Shonna Chock 02/21/2019,4:34 PM

## 2019-02-21 NOTE — Progress Notes (Signed)
Nutrition Follow-up  DOCUMENTATION CODES:   Severe malnutrition in context of chronic illness, Underweight  INTERVENTION:  Increase Osmolite 1.2 formula via NGT by 10 ml every 4 hours to goal rate of 50 ml/hr.   30 ml Prostat once daily.    Tube feeding regimen provides 1540 kcal (100% of needs), 82 grams of protein, and 984 ml of H2O.   NUTRITION DIAGNOSIS:   Severe Malnutrition related to chronic illness(TIA, nonhealing sacral wound) as evidenced by severe fat depletion, severe muscle depletion, energy intake < or equal to 75% for > or equal to 1 month; ongoing  GOAL:   Patient will meet greater than or equal to 90% of their needs; progressing  MONITOR:   PO intake, Supplement acceptance, Labs, Weight trends, Skin, I & O's  REASON FOR ASSESSMENT:   Malnutrition Screening Tool    ASSESSMENT:   85 y.o. 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  10/22 Thoracentesis attempted, patient refused/not tolerate 10/23  Pt developed new Afib with RVR 10/23 pt became abruptly less responsive, code stroke called, MRI brain showed left MCA stroke  Pt non-responsive during time of visit. MD suspects prognosis of hours to days. Daughter requests continue pt support for the next few days prior to goals of care discussion. NG was placed 10/25. Tube feedings initiated yesterday using Osmolite 1.2 at rate of 30 ml/hr, which is only providing 58% of kcal needs. RD to modify tube feeds to better meet nutrition needs. RD to continue to monitor.   Labs and medications reviewed.   Diet Order:   Diet Order            Diet NPO time specified  Diet effective now              EDUCATION NEEDS:   Not appropriate for education at this time  Skin:  Skin Assessment: Skin Integrity Issues: Skin Integrity Issues::  Unstageable Stage III: N/A Unstageable: L buttocks  Last BM:  10/26  Height:   Ht Readings from Last 1 Encounters:  02/14/2019 5\' 3"  (1.6 m)    Weight:   Wt Readings from Last 1 Encounters:  02/17/2019 46.9 kg    Ideal Body Weight:  52.3 kg  BMI:  Body mass index is 18.32 kg/m.  Estimated Nutritional Needs:   Kcal:  1500-1700  Protein:  70-85 grams  Fluid:  >/= 1.5 L/day    Corrin Parker, MS, RD, LDN Pager # [phone] After hours/ weekend pager # [phone]

## 2019-02-21 NOTE — Progress Notes (Signed)
I believe it is increasingly likely Sydney [name] will arrest soon.  I previewed a CODE BLUE scenario with daughter Sydney [name] today, and my hardening conviction that if she suffered cardiac arrest no pulse would be retrievable and that CPR and ACLS would be futile.    Katrina Stack attempts have been made during this admission, by myself, Dr. Posey Pronto, Dr. Lamonte Sakai and Dr. Leonie Man to share with Sydney [name] our understanding of her inexorably terminal state, and that CPR and ACLS would constitute futile care.  I affirm that joint decision-making has taken place to the maximum extent possible.  The Ethics committee, two independent pulmonologists, the Stroke service, and the palliative care service have been consulted.  Other family members and support persons have been present during discussions with Sydney [name].  Attempts at transfer have been made, but due to her clinical deterioration, the window of opportunity for this has now closed.    There have been at times confusing or conflicting decisions made by the healthcare proxy with regard to certain interventions during the last few admissions.  In my conversations with her, I find Sydney [name] to have extremely good recall of discussions with multiple treatment team members and explanations of disease processes, as well as very clear grasp of treatment options. This is all the more impressive given the nebulous and imprecise diagnosis ("failure to thrive") that is all we have ever been able to give.  I suspect any decisions that appear confusing stem from a fundamental but irreconcilable belief she holds that her mother will recover from what appears to me and all current members of the treatment team a certainly terminal disease.

## 2019-02-21 NOTE — Progress Notes (Signed)
Responded to consult for spiritual support. Pt was awake but unresponsive. I offered Zyleah, spiritual support with words of comfort, ministry of presence and silent prayer.  No family present at this time.   Palliative care Chaplain Resident  Fidel Levy [phone]

## 2019-02-21 NOTE — Progress Notes (Signed)
PROGRESS NOTE    Sydney [name]  [id number] DOB: 06-15-1930 DOA: 02/25/2019 PCP: [name], MD      Brief Narrative:  Sydney [name] is a 85 y.o. 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    In the ER, pO2 60 mmHg, CT chest showed large bilateral pleural effusions, L>R as well as atelectasis of LLL and new density in left mainstem bronchus concerning for aspirated material or mucus plugging.  Please see my summary from 9/29 or previous summaries by my partners in the Hospitalist service for further details.      Interim summary: Patient admitted and started on supplemental O2, empiric antibiotics. 10/22 Thoracentesis attempted, patient could not tolerate 10/23 Patient developed new Afib with RVR, started on diltiazem, converted back to NSR 10/23 same day, patient became abruptly less responsive, CODE STROKE called, MRI brain showed left MCA stroke        Assessment & Plan:  Acute left MCA stroke See summary 10/26.  Neurology believe this was embolic. -Continue aspirin 300 PR daily -May start anticoagulation in 2 weeks from event -Supportive care     Acute hypoxic respiratory failure Aspiration with blocked L mainstem bronchus Doubt aspiration pneumonia Never smoker.  No previous lung disease.   Not candidate for bronchoscopy -Stop Zosyn -Consult ID appreciate cares  Afib with RVR Reverted to sinus rhythm.  CHA2DS2-Vasc 6, holding anticoagulation until Nov 7 due to size of infarct. Persistent sinus tachycardia.  Acute on chronic systolic and diastolic CHF Bilateral pleural effusions Had developed ascites and effusions earlier in hospitalization due to hypoalbuminemia.    CXR 10/25 showed worsening pulmonary edema.  O2 needs improving with Lasix but still requiring 2-3 L.  Cr rising on  diuretics.    I believe her respiratory failure is primarily hypoalbuminemia related pulmonary effusions and edema in the setting of her now terminally weak respiratory capacity from failure to thrive, compounded by systolic dysfunction/CHF and her plugged right mainstem.  Bronchoscopy and thoracentesis were considered as options, but given her steady decline, it is my opinion this would likely cause more harm than benefit.  I have discussed the likely futility of any medical approaches to improving her breathing, which as stated is ultimately limited by her weakening respiratory capacity from failure to thrive, and that I suspect prognsosis of hours to days.   -Will given albumin 25 g x2 and chase with Lasix 40 twice daily -Daily BMP  Sacral decubitus ulcer -WOC   Failure to thrive Severe protein calorie malnutrition NG placed 10/25. -Continue TFs -Consult nutrition   History Cdiff s/p stool transplant -Continue oral vancomycin, for now, ID following  Hypernatremia -Monitor BMP daily   Normocytic anemia Stable, no clinical bleeding. -Trend Hgb  AKI Non anion gap metabolic acidosis Creatinine up to 1.71 from 1.07 two days ago with diuresis. -Albumin today  Hypokalemia -Replete K         MDM and disposition: The below labs and imaging reports reviewed and summarized above.  Medication management as above. Case discussed with ID.  The patient was admitted with hypoxic respiratory failure, and has now developed Afib with RVR, and new stroke. Her respiratory failure has waxed and waned, but she has had RR >30 for more than 24 hours, and I suspect will tire out soon.  It is my medical opinion that [name] has  hours to days to live.          DVT prophylaxis: Lovenox Code Status: FULL Family Communication: Daughter    Consultants:   Neurology  Pulmonology  Cardiology  Infectious disease  Palliative care  Procedures:   10/21 CT chest without  contrast -- LLL collapse, L mainstem down, large effusions  10/22 attempted thoracentesis -- failed  10/23 CT head code stroke -- generalized atrophy  10/23 MRI brain -- MCA stroke  10/23 CTA head and neck -- L M3 occlusion  10/25 NG tube placement  Antimicrobials:   Flagyl 10/21 >> 10/26  Cephalexin x1 10/22         Cefepime 10/24 >> 10/25                               Zosyn 10/25 >>                                   Oral Vanc 10/26 >>   Subjective: Respiratory rate remains shallow and in the high 20s and 30s.  She is unresponsive to me, and to most nursing cares.        Objective: Vitals:   02/21/19 0700 02/21/19 0711 02/21/19 0743 02/21/19 0812  BP:  124/82    Pulse: (!) 110 (!) 114  (!) 111  Resp: (!) 26 (!) 27  (!) 27  Temp:      TempSrc:      SpO2: 100% 100% 100% 100%  Weight:      Height:        Intake/Output Summary (Last 24 hours) at 02/21/2019 1145 Last data filed at 02/21/2019 0500 Gross per 24 hour  Intake 240.5 ml  Output 250 ml  Net -9.5 ml   Filed Weights   02/04/2019 1052  Weight: 46.9 kg    Examination: General appearance: cachectic adult female, lying in bed, EEG in place, no response to touch.   HEENT: lips dry, dentition dry, does not open mouth Skin: dry and tented Cardiac: Tachycardic, regular, no clear murmurs, no JVD, no LE edema Respiratory: Tachypneic, shallow, coarse upper airway secretions audible on exam, no wheezing, breath sounds diminished Abdomen: Abdomen without mass or rigidity MSK: thenar wasting, temporal wasting Neuro: Does not follow commands Psych: unable to assess    Data Reviewed: I have personally reviewed following labs and imaging studies:  CBC: Recent Labs  Lab 02/17/19 0519 02/17/19 1318 02/18/19 0222 02/19/19 0234 02/20/19 0543 02/21/19 0350  WBC 11.4* 13.0* 14.2* 12.3* 13.4* 13.6*  NEUTROABS 10.4* 12.2*  --   --   --   --   HGB 9.1* 9.3* 8.1* 8.6* 8.6* 9.6*  HCT 28.9* 30.8* 26.0* 28.0*  26.6* 29.5*  MCV 102.1* 106.9* 105.3* 106.1* 101.1* 101.4*  PLT 152 143* 134* 131* 120* [id number]*   Basic Metabolic Panel: Recent Labs  Lab 02/17/19 0519 02/17/19 1318 02/18/19 0222 02/19/19 0234 02/20/19 0543 02/21/19 0350  NA 141 138 139 143 146* 145  K 3.2* 3.3* 4.4 4.7 3.5 3.2*  CL 109 110 113* 117* 118* 118*  CO2 20* 17* 17* 20* 18* 17*  GLUCOSE 73 148* 118* 118* 127* 203*  BUN 32* 30* 27* 23 22 31*  CREATININE 1.20* 1.16* 0.96 1.07* 1.33* 1.71*  CALCIUM 8.4* 8.0* 7.7* 8.0* 8.3* 8.1*  MG 1.9 2.1 1.8  --   --   --  GFR: Estimated Creatinine Clearance: 17.5 mL/min (A) (by C-G formula based on SCr of 1.71 mg/dL (H)). Liver Function Tests: Recent Labs  Lab 02/16/19 0505 02/17/19 0519 02/17/19 1318 02/20/19 0543 02/21/19 0350  AST 31 22 23 19 22   ALT 9 7 6 6 7   ALKPHOS 102 92 89 95 174*  BILITOT 1.8* 1.1 1.3* 1.4* 0.6  PROT 6.3* 5.8* 6.1* 5.9* 5.5*  ALBUMIN 2.4* 2.3* 2.4* 2.4* 2.0*   No results for input(s): LIPASE, AMYLASE in the last 168 hours. Recent Labs  Lab 02/17/19 1318  AMMONIA 15   Coagulation Profile: Recent Labs  Lab 02/18/19 0222  INR 1.4*   Cardiac Enzymes: No results for input(s): CKTOTAL, CKMB, CKMBINDEX, TROPONINI in the last 168 hours. BNP (last 3 results) No results for input(s): PROBNP in the last 8760 hours. HbA1C: No results for input(s): HGBA1C in the last 72 hours. CBG: Recent Labs  Lab 02/20/19 0825 02/20/19 1709 02/20/19 2014 02/20/19 2355 02/21/19 0449  GLUCAP 105* 109* 151* 176* 202*   Lipid Profile: No results for input(s): CHOL, HDL, LDLCALC, TRIG, CHOLHDL, LDLDIRECT in the last 72 hours. Thyroid Function Tests: No results for input(s): TSH, T4TOTAL, FREET4, T3FREE, THYROIDAB in the last 72 hours. Anemia Panel: No results for input(s): VITAMINB12, FOLATE, FERRITIN, TIBC, IRON, RETICCTPCT in the last 72 hours. Urine analysis:    Component Value Date/Time   COLORURINE YELLOW 02/17/2019 0251   APPEARANCEUR HAZY (A)  02/17/2019 0251   LABSPEC 1.017 02/17/2019 0251   PHURINE 5.0 02/17/2019 0251   GLUCOSEU NEGATIVE 02/17/2019 0251   HGBUR LARGE (A) 02/17/2019 0251   BILIRUBINUR NEGATIVE 02/17/2019 0251   KETONESUR 5 (A) 02/17/2019 0251   PROTEINUR 30 (A) 02/17/2019 0251   UROBILINOGEN 0.2 2020/12/312 1048   NITRITE NEGATIVE 02/17/2019 0251   LEUKOCYTESUR MODERATE (A) 02/17/2019 0251   Sepsis Labs: @LABRCNTIP (procalcitonin:4,lacticacidven:4)  ) Recent Results (from the past 240 hour(s))  SARS CORONAVIRUS 2 (TAT 6-24 HRS) Nasopharyngeal Nasopharyngeal Swab     Status: None   Collection Time: 02/09/2019  1:33 PM   Specimen: Nasopharyngeal Swab  Result Value Ref Range Status   SARS Coronavirus 2 NEGATIVE NEGATIVE Final    Comment: (NOTE) SARS-CoV-2 target nucleic acids are NOT DETECTED. The SARS-CoV-2 RNA is generally detectable in upper and lower respiratory specimens during the acute phase of infection. Negative results do not preclude SARS-CoV-2 infection, do not rule out co-infections with other pathogens, and should not be used as the sole basis for treatment or other patient management decisions. Negative results must be combined with clinical observations, patient history, and epidemiological information. The expected result is Negative. Fact Sheet for Patients: SugarRoll.be Fact Sheet for Healthcare Providers: https://www.woods-mathews.com/ This test is not yet approved or cleared by the [place] FDA and  has been authorized for detection and/or diagnosis of SARS-CoV-2 by FDA under an Emergency Use Authorization (EUA). This EUA will remain  in effect (meaning this test can be used) for the duration of the COVID-19 declaration under Section 56 4(b)(1) of the Act, 21 U.S.C. section 360bbb-3(b)(1), unless the authorization is terminated or revoked sooner. Performed at [facility], [place] [address], [place], [place] [address]   Culture,  Urine     Status: Abnormal   Collection Time: 02/17/19  2:51 AM   Specimen: Urine, Clean Catch  Result Value Ref Range Status   Specimen Description   Final    URINE, CLEAN CATCH Performed at [facility], [place] [address] [place],  [place] [address]    Special Requests   Final    NONE Performed at [facility], [place] [address], [place], [place] [address]    Culture >=100,000 COLONIES/mL PSEUDOMONAS AERUGINOSA (A)  Final   Report Status 02/19/2019 FINAL  Final   Organism ID, Bacteria PSEUDOMONAS AERUGINOSA (A)  Final      Susceptibility   Pseudomonas aeruginosa - MIC*    CEFTAZIDIME 16 INTERMEDIATE Intermediate     CIPROFLOXACIN <=0.25 SENSITIVE Sensitive     GENTAMICIN <=1 SENSITIVE Sensitive     IMIPENEM 2 SENSITIVE Sensitive     PIP/TAZO 32 SENSITIVE Sensitive     CEFEPIME INTERMEDIATE Intermediate     * >=100,000 COLONIES/mL PSEUDOMONAS AERUGINOSA  MRSA PCR Screening     Status: None   Collection Time: 02/17/19 12:18 PM   Specimen: Nasal Mucosa; Nasopharyngeal  Result Value Ref Range Status   MRSA by PCR NEGATIVE NEGATIVE Final    Comment:        The GeneXpert MRSA Assay (FDA approved for NASAL specimens only), is one component of a comprehensive MRSA colonization surveillance program. It is not intended to diagnose MRSA infection nor to guide or monitor treatment for MRSA infections. Performed at [facility], [place] [address] [place],  [address]          Radiology Studies: Dg Abd 1 View  Result Date: 02/19/2019 CLINICAL DATA:  Enteric tube placement EXAM: ABDOMEN - 1 VIEW COMPARISON:  Abdominal radiograph from earlier today FINDINGS: Weighted enteric tube tip in distal stomach. Contrast seen within the nondistended gastric lumen. No disproportionately dilated small bowel loops. No evidence of pneumatosis or pneumoperitoneum. Partially visualized left greater than right pleural effusions at the lung  bases. IMPRESSION: 1. Weighted enteric tube tip in the distal stomach. 2. Nonobstructive bowel gas pattern. 3. Partially visualized left greater than right pleural effusions at the lung bases. Electronically Signed   By: Ilona Sorrel M.D.   On: 02/19/2019 20:39   Dg Abd 1 View  Result Date: 02/19/2019 CLINICAL DATA:  NG tube repositioning EXAM: ABDOMEN - 1 VIEW COMPARISON:  None. FINDINGS: There is a nasogastric tube entering the stomach and being redirected cephalad with the tip in the distal esophagus directed cephalad. There is no bowel dilatation to suggest obstruction. There is no evidence of pneumoperitoneum, portal venous gas or pneumatosis. There are no pathologic calcifications along the expected course of the ureters. Large left pleural effusion.  Small right pleural effusion. The osseous structures are unremarkable. IMPRESSION: Nasogastric tube entering the stomach and being redirected cephalad with the tip in the distal esophagus directed cephalad. Recommend repositioning prior to use. Electronically Signed   By: Kathreen Devoid   On: 02/19/2019 15:12   Dg Abd 1 View  Result Date: 02/19/2019 CLINICAL DATA:  Nasogastric tube repositioning EXAM: ABDOMEN - 1 VIEW COMPARISON:  None. FINDINGS: There is a nasogastric tube entering the stomach and being redirected cephalad with the tip in the distal esophagus directed cephalad. There is no bowel dilatation to suggest obstruction. There is no evidence of pneumoperitoneum, portal venous gas or pneumatosis. There are no pathologic calcifications along the expected course of the ureters. Large left pleural effusion. Small right pleural effusion. Bibasilar atelectasis. The osseous structures are unremarkable. IMPRESSION: Nasogastric tube entering the stomach and being redirected cephalad with the tip in the distal esophagus directed cephalad. Recommend repositioning prior to use. Electronically Signed   By: Kathreen Devoid   On: 02/19/2019 15:11   Dg  Abd 1  View  Result Date: 02/19/2019 CLINICAL DATA:  NG tube placed EXAM: ABDOMEN - 1 VIEW COMPARISON:  None. FINDINGS: There is a nasogastric tube entering the stomach and being redirected cephalad with the tip in the distal esophagus directed cephalad. There is no bowel dilatation to suggest obstruction. There is no evidence of pneumoperitoneum, portal venous gas or pneumatosis. There are no pathologic calcifications along the expected course of the ureters. The osseous structures are unremarkable. IMPRESSION: There is a nasogastric tube entering the stomach and being redirected cephalad with the tip in the distal esophagus directed cephalad. Recommend repositioning prior to use. Electronically Signed   By: Kathreen Devoid   On: 02/19/2019 14:59   Dg Chest Port 1 View  Result Date: 02/20/2019 CLINICAL DATA:  Increased shortness of breath EXAM: PORTABLE CHEST 1 VIEW COMPARISON:  02/19/2019 FINDINGS: Feeding catheter has been advanced into the stomach. Large left-sided pleural effusion is again noted with some associated infiltrate. Patchy right perihilar opacities are noted. These are stable in appearance from the prior exam some superimposed vascular congestion is noted. Small right pleural effusion is seen. IMPRESSION: Feeding catheter within the stomach. Large left pleural effusion with bilateral parenchymal opacities. Vascular congestion somewhat increased from the prior study. Electronically Signed   By: Inez Catalina M.D.   On: 02/20/2019 00:40   Dg Chest Port 1 View  Result Date: 02/19/2019 CLINICAL DATA:  Nasogastric tube placement EXAM: PORTABLE CHEST 1 VIEW COMPARISON:  Chest radiograph dated 02/19/2019. FINDINGS: An enteric tube enters the esophagus and is coiled near the gastroesophageal junction with the tip overlying the upper thoracic esophagus. The heart and mediastinal contours are obscured. A large left pleural effusion with associated atelectasis/airspace disease appears similar to prior exam.  Right upper lung interstitial and airspace opacities appear unchanged. A small right pleural effusion is unchanged. Right basilar airspace opacities have increased. IMPRESSION: 1. Enteric tube is coiled in the distal esophagus, with the tip overlying the upper thoracic esophagus. Electronically Signed   By: Zerita Boers M.D.   On: 02/19/2019 18:44   Dg Abd Portable 1v  Result Date: 02/19/2019 CLINICAL DATA:  NG tube placement EXAM: PORTABLE ABDOMEN - 1 VIEW COMPARISON:  Earlier same day FINDINGS: There is a nasogastric tube coiled in the distal esophagus with the tip directed cephalad outside the field of view. There is no bowel dilatation to suggest obstruction. There is no evidence of pneumoperitoneum, portal venous gas or pneumatosis. There are no pathologic calcifications along the expected course of the ureters. There is a trace right pleural effusion. There is a moderate left pleural effusion. The osseous structures are unremarkable. IMPRESSION: Nasogastric tube coiled in the distal esophagus with the tip directed cephalad outside the field of view. Recommend repositioning the nasogastric tube prior to use. Electronically Signed   By: Kathreen Devoid   On: 02/19/2019 17:13   Dg Loyce Dys Tube Plc W/fl W/rad  Result Date: 02/19/2019 CLINICAL DATA:  Request for NG tube placement. EXAM: NASO G TUBE PLACEMENT WITH FL AND WITH RAD CONTRAST:  20 cc Omnipaque iodinated contrast per NG tube. FLUOROSCOPY TIME:  Fluoroscopy Time:  3 minutes 42 seconds Radiation Exposure Index (if provided by the fluoroscopic device): 20.4 mGy Number of Acquired Spot Images: 0 COMPARISON:  Abdominal radiograph from earlier today. FINDINGS: Technically successful placement of 10 Fr CorTrak nasogastric tube under fluoroscopic guidance, with the tip verified in the gastric antrum. Injected contrast seen within the nondistended stomach. IMPRESSION: Technically successful placement of  nasogastric tube under fluoroscopic guidance, with  the tip verified in the gastric antrum. Tube is ready for use. Electronically Signed   By: Ilona Sorrel M.D.   On: 02/19/2019 20:38        Scheduled Meds:  aspirin  300 mg Rectal Daily   chlorhexidine  15 mL Mouth Rinse BID   Chlorhexidine Gluconate Cloth  6 each Topical Daily   enoxaparin (LOVENOX) injection  30 mg Subcutaneous Q24H   feeding supplement (OSMOLITE 1.2 CAL)  1,000 mL Per Tube Q24H   furosemide  40 mg Intravenous Q12H   latanoprost  1 drop Both Eyes QHS   levalbuterol  0.63 mg Nebulization TID   mouth rinse  15 mL Mouth Rinse q12n4p   potassium chloride  40 mEq Oral BID   vancomycin  125 mg Oral QID   Continuous Infusions:  sodium chloride Stopped (02/17/19 1441)   albumin human       LOS: 6 days    Time spent: 35 minutes    Edwin Dada, MD [facility] 02/21/2019, 11:45 AM     Please page through [place]:  [email or web address] Password [email or web address] If 7PM-7AM, please contact night-coverage

## 2019-02-21 NOTE — Progress Notes (Signed)
STROKE TEAM PROGRESS NOTE   SUBJECTIVE Patient's daughter is at the bedside.  She remains barely responsive and tries to open her eyes to auditory and painful stimuli but remains aphasic and is not following commands.  She withdraws left side but has only trace withdrawal on the right side.EEG suggestive of moderateto severediffuse encephalopathy, nonspecific to etiology.No seizures or epileptiform discharges were seen throughout the recording.  OBJECTIVE Vitals:   02/21/19 0743 02/21/19 0812  BP:    Pulse:  (!) 111  Resp:  (!) 27  Temp:    SpO2: 100% 100%     Physical Exam Frail malnourished looking elderly African-American lady not in distress. . Afebrile. Head is nontraumatic. Neck is supple without bruit.    Cardiac exam no murmur or gallop. Lungs are clear to auscultation. Distal pulses are well felt.  Both feet have boots with ulcerations on the heels and sacrum.Marland Kitchen Neurological Exam :  Patient is lethargic with eyes closed.  She barely opens eyes to sternal rub and auditory stimuli but will not sustain attention.  She is aphasic and does not follow commands.  She has left gaze preference but is able to move eyes partially to the right with doll's eye movements.  She blinks to threat on the left but not on the right.  She does grimace to pain.  Tongue midline.  Very occasional left sided upper and lower extremity spontaneous movements.  Tone is diminished on the right.  Right hemiplegia.  Trace withdrawal in the right lower and upper extremity to painful stimuli only.  Pertinent Laboratory Studies (past 3 days) / Diagnostics (past 24h) Recent Labs    02/19/19 0234 02/20/19 0543 02/21/19 0350  WBC 12.3* 13.4* 13.6*  HGB 8.6* 8.6* 9.6*  PLT 131* 120* 123*  NA 143 146* 145  K 4.7 3.5 3.2*  CREATININE 1.07* 1.33* 1.71*  GLUCOSE 118* 127* 203*    No results found.   ASSESSMENT [name] is a 85 y.o. female with left hemispheric infarct likely of embolic etiology  with residual aphasia and right hemiparesis.  A brain scan also shows significant atrophy raising concern for significant underlying dementia and her mental status being out of proportion to her stroke raises concern for nonconvulsive seizures but EEG done yesterday shows no seizure activity and only moderate to severe diffuse encephalopathy which is nonspecific.       RECOMMENDATIONS Discontinue long-term EEG monitoring    Patient's prognosis appears quite poor given disabling stroke as well as poor general medical condition with failure to thrive.  I had a long discussion with the patient's daughter and she is unlikely to recover to be able to swallow safely anytime soon and daughter informs me that she is not a good candidate for PEG due to medical condition.  She has been provided good care by the daughter and family at home so far but her nutritional needs are going to be challenging to meet in the near future.  Daughter wants to continue support for the next few days and see how she does but she is realistic and open to discussion with palliative care team about goals of care.  Discussed with Dr. Eudelia Bunch.  Greater than 50% time during this 25-minute visit was spent on counseling and coordination of care and discussion with care team.   Hospital day # [place], [place] for Schedule & Pager information 02/21/2019 5:31 PM    To contact Stroke Continuity  provider, please refer to [email or web address] After hours, contact General Neurology

## 2019-02-21 NOTE — Progress Notes (Signed)
vLTM EEG D/C per Neuro. No skin breakdown

## 2019-02-22 ENCOUNTER — Inpatient Hospital Stay (HOSPITAL_COMMUNITY): Payer: Medicare Other

## 2019-02-22 DIAGNOSIS — D649 Anemia, unspecified: Secondary | ICD-10-CM | POA: Diagnosis not present

## 2019-02-22 DIAGNOSIS — I4891 Unspecified atrial fibrillation: Secondary | ICD-10-CM | POA: Diagnosis not present

## 2019-02-22 DIAGNOSIS — I63512 Cerebral infarction due to unspecified occlusion or stenosis of left middle cerebral artery: Secondary | ICD-10-CM | POA: Diagnosis not present

## 2019-02-22 DIAGNOSIS — Z8719 Personal history of other diseases of the digestive system: Secondary | ICD-10-CM | POA: Diagnosis not present

## 2019-02-22 DIAGNOSIS — R4789 Other speech disturbances: Secondary | ICD-10-CM | POA: Diagnosis not present

## 2019-02-22 DIAGNOSIS — J90 Pleural effusion, not elsewhere classified: Secondary | ICD-10-CM | POA: Diagnosis not present

## 2019-02-22 DIAGNOSIS — R627 Adult failure to thrive: Secondary | ICD-10-CM | POA: Diagnosis not present

## 2019-02-22 DIAGNOSIS — E43 Unspecified severe protein-calorie malnutrition: Secondary | ICD-10-CM | POA: Diagnosis not present

## 2019-02-22 LAB — COMPREHENSIVE METABOLIC PANEL
ALT: 9 U/L (ref 0–44)
AST: 29 U/L (ref 15–41)
Albumin: 2.6 g/dL — ABNORMAL LOW (ref 3.5–5.0)
Alkaline Phosphatase: 350 U/L — ABNORMAL HIGH (ref 38–126)
Anion gap: 8 (ref 5–15)
BUN: 40 mg/dL — ABNORMAL HIGH (ref 8–23)
CO2: 18 mmol/L — ABNORMAL LOW (ref 22–32)
Calcium: 8.2 mg/dL — ABNORMAL LOW (ref 8.9–10.3)
Chloride: 122 mmol/L — ABNORMAL HIGH (ref 98–111)
Creatinine, Ser: 1.97 mg/dL — ABNORMAL HIGH (ref 0.44–1.00)
GFR calc Af Amer: 26 mL/min — ABNORMAL LOW (ref >60)
GFR calc non Af Amer: 22 mL/min — ABNORMAL LOW (ref >60)
Glucose, Bld: 180 mg/dL — ABNORMAL HIGH (ref 70–99)
Potassium: 4.3 mmol/L (ref 3.5–5.1)
Sodium: 148 mmol/L — ABNORMAL HIGH (ref 135–145)
Total Bilirubin: 1.2 mg/dL (ref 0.3–1.2)
Total Protein: 5.3 g/dL — ABNORMAL LOW (ref 6.5–8.1)

## 2019-02-22 LAB — GLUCOSE, CAPILLARY
Glucose-Capillary: 107 mg/dL — ABNORMAL HIGH (ref 70–99)
Glucose-Capillary: 165 mg/dL — ABNORMAL HIGH (ref 70–99)
Glucose-Capillary: 169 mg/dL — ABNORMAL HIGH (ref 70–99)
Glucose-Capillary: 166 mg/dL — ABNORMAL HIGH (ref 70–99)
Glucose-Capillary: 135 mg/dL — ABNORMAL HIGH (ref 70–99)
Glucose-Capillary: 116 mg/dL — ABNORMAL HIGH (ref 70–99)

## 2019-02-22 LAB — CBC
HCT: 28.3 % — ABNORMAL LOW (ref 36.0–46.0)
Hemoglobin: 8.9 g/dL — ABNORMAL LOW (ref 12.0–15.0)
MCH: 33 pg (ref 26.0–34.0)
MCHC: 31.4 g/dL (ref 30.0–36.0)
MCV: 104.8 fL — ABNORMAL HIGH (ref 80.0–100.0)
Platelets: 116 10*3/uL — ABNORMAL LOW (ref 150–400)
RBC: 2.7 MIL/uL — ABNORMAL LOW (ref 3.87–5.11)
RDW: 23.3 % — ABNORMAL HIGH (ref 11.5–15.5)
WBC: 14.4 10*3/uL — ABNORMAL HIGH (ref 4.0–10.5)
nRBC: 2.2 % — ABNORMAL HIGH (ref 0.0–0.2)

## 2019-02-22 MED ORDER — VANCOMYCIN 50 MG/ML ORAL SOLUTION
125.0000 mg | Freq: Once | ORAL | Status: AC
  Administered 2019-02-22: 125 mg via NASOGASTRIC
  Filled 2019-02-22: qty 2.5

## 2019-02-22 NOTE — Progress Notes (Signed)
    Protection for Infectious Disease    Date of Admission:  02/25/2019     ID: Sydney [name] is a 85 y.o. female with significant stroke in severe protein caloric malnourished patient Active Problems:   Meningioma ([place])   Hypokalemia   Anemia   Glaucoma   Physical deconditioning   History of Clostridioides difficile colitis   Protein-calorie malnutrition, severe   Failure to thrive in adult   Sacral ulcer ([place])   HCAP (healthcare-associated pneumonia)   Pleural effusion   Acute ischemic left MCA stroke (HCC)   Atrial fibrillation with RVR (HCC)   SVT (supraventricular tachycardia) (HCC)   Metabolic acidosis   Mucus plugging of bronchi   Cardiomyopathy (HCC)    Subjective: Does not respond to verbal or visual stimuli. Non participatory with bathing by RN this morning. Afebrile. No diarrhea.  Medications:  . acetaminophen  650 mg Rectal Q6H  . aspirin  300 mg Rectal Daily  . chlorhexidine  15 mL Mouth Rinse BID  . Chlorhexidine Gluconate Cloth  6 each Topical Daily  . enoxaparin (LOVENOX) injection  30 mg Subcutaneous Q24H  . feeding supplement (OSMOLITE 1.2 CAL)  1,000 mL Per Tube Q24H  . feeding supplement (PRO-STAT SUGAR FREE 64)  30 mL Per Tube Daily  . latanoprost  1 drop Both Eyes QHS  . levalbuterol  0.63 mg Nebulization BID  . mouth rinse  15 mL Mouth Rinse q12n4p  . vancomycin  125 mg Oral QID    Objective: Vital signs in last 24 hours: Temp:  [97.5 F (36.4 C)-98.4 F (36.9 C)] 98.4 F (36.9 C) (10/28 1100) Pulse Rate:  [70-109] 70 (10/28 0348) Resp:  [22-32] 30 (10/28 0348) BP: (85-99)/(54-66) 98/60 (10/28 1100) SpO2:  [100 %] 100 % (10/28 0348) gen = lethargic, emaciated HEENT= bitemporal wasting. Dry oral mucosa, Ext= pitting edema from anasarca to lower extremities, muscle wasting otherwise Lab Results Recent Labs    02/21/19 0350 02/22/19 0336  WBC 13.6* 14.4*  HGB 9.6* 8.9*  HCT 29.5* 28.3*  NA 145 148*  K 3.2* 4.3  CL 118* 122*   CO2 17* 18*  BUN 31* 40*  CREATININE 1.71* 1.97*   Liver Panel Recent Labs    02/21/19 0350 02/22/19 0336  PROT 5.5* 5.3*  ALBUMIN 2.0* 2.6*  AST 22 29  ALT 7 9  ALKPHOS 174* 350*  BILITOT 0.6 1.2   Sedimentation Rate No results for input(s): ESRSEDRATE in the last 72 hours. C-Reactive Protein No results for input(s): CRP in the last 72 hours.  Microbiology:  Studies/Results: No results found.   Assessment/Plan: cdifficile proph = can continue with oral vanco 125mg  BID per ng, but feel this is futile.  Agree with dr danford's request for ethics committee review of futility of care  Will sign out.  [facility] for Infectious Diseases Cell: [phone] Pager: [phone]  02/22/2019, 4:48 PM

## 2019-02-22 NOTE — Progress Notes (Signed)
PROGRESS NOTE    CAROLAN LIFE  [id number] DOB: Dec 13, 1930 DOA: 02/09/2019 PCP: [name], MD      Brief Narrative:  Sydney [name] is a 85 y.o. 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    In the ER, pO2 60 mmHg, CT chest showed large bilateral pleural effusions, L>R as well as atelectasis of LLL and new density in left mainstem bronchus concerning for aspirated material or mucus plugging.  Please see my summary from 9/29 or previous summaries by my partners in the Hospitalist service for further details.      Interim summary: Patient admitted and started on supplemental O2, empiric antibiotics. 10/22 Thoracentesis attempted, patient could not tolerate 10/23 Patient developed new Afib with RVR, started on diltiazem, converted back to NSR 10/23 same day, patient became abruptly less responsive, CODE STROKE called, MRI brain showed left MCA stroke        Assessment & Plan:    Acute hypoxic respiratory failure Aspiration with blocked L mainstem bronchus Doubt aspiration pneumonia Acute on chronic systolic and diastolic CHF Bilateral pleural effusions See previous note re: contributing factors to respiratory failure.  Diuretics and empiric antibiotics and bronchodilators have failed to improve things to the point that her weakened, failing respiratory capacity can keep up.  No further medical therapies can feasibly improve her terminal respiratory failure.   -Continue oxygen    Afib with RVR Reverted to sinus rhythm.  CHA2DS2-Vasc 6, holding anticoagulation until Nov 7 due to size of infarct. Persistent sinus tachycardia, worsening.  Acute left MCA stroke See summary 10/26.  Neurology believe this was embolic. -Continue aspirin 300 PR daily -Start Eliquis if able Nov 7 -Supportive  care  Sacral decubitus ulcer -WOC   Failure to thrive Severe protein calorie malnutrition NG placed 10/25.  Pulled out today. -Repeat CXR, replace tube if needed -Hold TFs until tube position can be determined -Consult nutrition   History Cdiff s/p stool transplant -Continue oral vancomycin, 24 more hours, then stop  Hypernatremia -Monitor BMP daily   Normocytic anemia No clinical bleeding. -Daily Hgb  AKI Non anion gap metabolic acidosis Cr worsening. -Stop Lasix         MDM and disposition: The below labs and imaging reports reviewed and summarized above.  Medication management as above.   The patient was admitted with hypoxic respiratory failure, subsequently developed Afib with RVR, and new stroke.   Her condition is terminal, no medical therapies, no surgeries, no interventions can reverse her respiratory failure.  It is still my medical opinion that [name] has hours to days to live.          DVT prophylaxis: Lovenox Code Status: FULL Family Communication: Daughter    Consultants:   Neurology  Pulmonology  Cardiology  Infectious disease  Palliative care  Procedures:   10/21 CT chest without contrast -- LLL collapse, L mainstem down, large effusions  10/22 attempted thoracentesis -- failed  10/23 CT head code stroke -- generalized atrophy  10/23 MRI brain -- MCA stroke  10/23 CTA head and neck -- L M3 occlusion  10/25 NG tube placement  Antimicrobials:   Flagyl 10/21 >> 10/26  Cephalexin x1 10/22         Cefepime 10/24 >> 10/25  Zosyn 10/25 >> 10/27                                   Oral Vanc 10/26 >>   Subjective: She is unresponsive to me in all nursing cares.  She is severely tachycardic and tachypneic.  She looks agonal today.  Her blood pressure is drifting down.     Objective: Vitals:   02/22/19 0035 02/22/19 0348 02/22/19 0700 02/22/19 1100  BP: (!) 88/60 (!) 88/61 99/63  98/60  Pulse: (!) 101 70    Resp: (!) 22 (!) 30    Temp:  (!) 97.5 F (36.4 C) 98.2 F (36.8 C) 98.4 F (36.9 C)  TempSrc:  Oral Oral Axillary  SpO2: 100% 100%    Weight:      Height:        Intake/Output Summary (Last 24 hours) at 02/22/2019 1546 Last data filed at 02/21/2019 2341 Gross per 24 hour  Intake 352.46 ml  Output 300 ml  Net 52.46 ml   Filed Weights   02/13/2019 1052  Weight: 46.9 kg    Examination: General appearance: Cachectic adult female, lying in bed, does not respond to touch, no spontaneous verbalizations HEENT: Lips thinning, pasted to teeth, oropharynx dry, mouth open. Skin: Dry and tented Cardiac: Tachycardic, regular, no lower extremity edema, no murmurs Respiratory: Tachypneic, decreased air entry, gasping Abdomen: No mass rigidity MSK: Cachectic, thenar wasting, temporal wasting Neuro: Does not follow commands, no spontaneous utterances, does not open eyes.  Some spontaneous movements, do not appear purposeful. Psych: unable to assess    Data Reviewed: I have personally reviewed following labs and imaging studies:  CBC: Recent Labs  Lab 02/17/19 0519 02/17/19 1318 02/18/19 0222 02/19/19 0234 02/20/19 0543 02/21/19 0350 02/22/19 0336  WBC 11.4* 13.0* 14.2* 12.3* 13.4* 13.6* 14.4*  NEUTROABS 10.4* 12.2*  --   --   --   --   --   HGB 9.1* 9.3* 8.1* 8.6* 8.6* 9.6* 8.9*  HCT 28.9* 30.8* 26.0* 28.0* 26.6* 29.5* 28.3*  MCV 102.1* 106.9* 105.3* 106.1* 101.1* 101.4* 104.8*  PLT 152 143* 134* 131* 120* 123* [id number]*   Basic Metabolic Panel: Recent Labs  Lab 02/17/19 0519 02/17/19 1318 02/18/19 0222 02/19/19 0234 02/20/19 0543 02/21/19 0350 02/22/19 0336  NA 141 138 139 143 146* 145 148*  K 3.2* 3.3* 4.4 4.7 3.5 3.2* 4.3  CL 109 110 113* 117* 118* 118* 122*  CO2 20* 17* 17* 20* 18* 17* 18*  GLUCOSE 73 148* 118* 118* 127* 203* 180*  BUN 32* 30* 27* 23 22 31* 40*  CREATININE 1.20* 1.16* 0.96 1.07* 1.33* 1.71* 1.97*  CALCIUM 8.4* 8.0*  7.7* 8.0* 8.3* 8.1* 8.2*  MG 1.9 2.1 1.8  --   --   --   --    GFR: Estimated Creatinine Clearance: 15.2 mL/min (A) (by C-G formula based on SCr of 1.97 mg/dL (H)). Liver Function Tests: Recent Labs  Lab 02/17/19 0519 02/17/19 1318 02/20/19 0543 02/21/19 0350 02/22/19 0336  AST 22 23 19 22 29   ALT 7 6 6 7 9   ALKPHOS 92 89 95 174* 350*  BILITOT 1.1 1.3* 1.4* 0.6 1.2  PROT 5.8* 6.1* 5.9* 5.5* 5.3*  ALBUMIN 2.3* 2.4* 2.4* 2.0* 2.6*   No results for input(s): LIPASE, AMYLASE in the last 168 hours. Recent Labs  Lab 02/17/19 1318  AMMONIA 15   Coagulation Profile: Recent Labs  Lab  02/18/19 0222  INR 1.4*   Cardiac Enzymes: No results for input(s): CKTOTAL, CKMB, CKMBINDEX, TROPONINI in the last 168 hours. BNP (last 3 results) No results for input(s): PROBNP in the last 8760 hours. HbA1C: No results for input(s): HGBA1C in the last 72 hours. CBG: Recent Labs  Lab 02/21/19 2020 02/21/19 2329 02/22/19 0404 02/22/19 0902 02/22/19 1248  GLUCAP 161* 181* 165* 169* 166*   Lipid Profile: No results for input(s): CHOL, HDL, LDLCALC, TRIG, CHOLHDL, LDLDIRECT in the last 72 hours. Thyroid Function Tests: No results for input(s): TSH, T4TOTAL, FREET4, T3FREE, THYROIDAB in the last 72 hours. Anemia Panel: No results for input(s): VITAMINB12, FOLATE, FERRITIN, TIBC, IRON, RETICCTPCT in the last 72 hours. Urine analysis:    Component Value Date/Time   COLORURINE YELLOW 02/17/2019 0251   APPEARANCEUR HAZY (A) 02/17/2019 0251   LABSPEC 1.017 02/17/2019 0251   PHURINE 5.0 02/17/2019 0251   GLUCOSEU NEGATIVE 02/17/2019 0251   HGBUR LARGE (A) 02/17/2019 0251   BILIRUBINUR NEGATIVE 02/17/2019 0251   KETONESUR 5 (A) 02/17/2019 0251   PROTEINUR 30 (A) 02/17/2019 0251   UROBILINOGEN 0.2 02/13/202014 1048   NITRITE NEGATIVE 02/17/2019 0251   LEUKOCYTESUR MODERATE (A) 02/17/2019 0251   Sepsis Labs: @LABRCNTIP (procalcitonin:4,lacticacidven:4)  ) Recent Results (from the past  240 hour(s))  SARS CORONAVIRUS 2 (TAT 6-24 HRS) Nasopharyngeal Nasopharyngeal Swab     Status: None   Collection Time: 02/04/2019  1:33 PM   Specimen: Nasopharyngeal Swab  Result Value Ref Range Status   SARS Coronavirus 2 NEGATIVE NEGATIVE Final    Comment: (NOTE) SARS-CoV-2 target nucleic acids are NOT DETECTED. The SARS-CoV-2 RNA is generally detectable in upper and lower respiratory specimens during the acute phase of infection. Negative results do not preclude SARS-CoV-2 infection, do not rule out co-infections with other pathogens, and should not be used as the sole basis for treatment or other patient management decisions. Negative results must be combined with clinical observations, patient history, and epidemiological information. The expected result is Negative. Fact Sheet for Patients: SugarRoll.be Fact Sheet for Healthcare Providers: https://www.woods-mathews.com/ This test is not yet approved or cleared by the [place] FDA and  has been authorized for detection and/or diagnosis of SARS-CoV-2 by FDA under an Emergency Use Authorization (EUA). This EUA will remain  in effect (meaning this test can be used) for the duration of the COVID-19 declaration under Section 56 4(b)(1) of the Act, 21 U.S.C. section 360bbb-3(b)(1), unless the authorization is terminated or revoked sooner. Performed at [facility], [place] [address] [place], [place] [address]   Culture, Urine     Status: Abnormal   Collection Time: 02/17/19  2:51 AM   Specimen: Urine, Clean Catch  Result Value Ref Range Status   Specimen Description   Final    URINE, CLEAN CATCH Performed at [facility], [place] [address] [place],  [address]    Special Requests   Final    NONE Performed at [facility], [place] [address], [place], [place] [address]    Culture >=100,000 COLONIES/mL PSEUDOMONAS AERUGINOSA (A)  Final    Report Status 02/19/2019 FINAL  Final   Organism ID, Bacteria PSEUDOMONAS AERUGINOSA (A)  Final      Susceptibility   Pseudomonas aeruginosa - MIC*    CEFTAZIDIME 16 INTERMEDIATE Intermediate     CIPROFLOXACIN <=0.25 SENSITIVE Sensitive     GENTAMICIN <=1 SENSITIVE Sensitive     IMIPENEM 2 SENSITIVE Sensitive     PIP/TAZO 32 SENSITIVE  Sensitive     CEFEPIME INTERMEDIATE Intermediate     * >=100,000 COLONIES/mL PSEUDOMONAS AERUGINOSA  MRSA PCR Screening     Status: None   Collection Time: 02/17/19 12:18 PM   Specimen: Nasal Mucosa; Nasopharyngeal  Result Value Ref Range Status   MRSA by PCR NEGATIVE NEGATIVE Final    Comment:        The GeneXpert MRSA Assay (FDA approved for NASAL specimens only), is one component of a comprehensive MRSA colonization surveillance program. It is not intended to diagnose MRSA infection nor to guide or monitor treatment for MRSA infections. Performed at [facility], [place] [address] [place], [place] [address]          Radiology Studies: No results found.      Scheduled Meds:  acetaminophen  650 mg Rectal Q6H   aspirin  300 mg Rectal Daily   chlorhexidine  15 mL Mouth Rinse BID   Chlorhexidine Gluconate Cloth  6 each Topical Daily   enoxaparin (LOVENOX) injection  30 mg Subcutaneous Q24H   feeding supplement (OSMOLITE 1.2 CAL)  1,000 mL Per Tube Q24H   feeding supplement (PRO-STAT SUGAR FREE 64)  30 mL Per Tube Daily   latanoprost  1 drop Both Eyes QHS   levalbuterol  0.63 mg Nebulization BID   mouth rinse  15 mL Mouth Rinse q12n4p   vancomycin  125 mg Oral QID   Continuous Infusions:  sodium chloride Stopped (02/17/19 1441)     LOS: 7 days    Time spent: 35 minutes    Edwin Dada, MD [facility] 02/22/2019, 3:46 PM     Please page through [place]:  [email or web address] Password [email or web address] If 7PM-7AM, please contact night-coverage

## 2019-02-22 NOTE — Progress Notes (Signed)
STROKE TEAM PROGRESS NOTE   SUBJECTIVE Patient's daughter is at the bedside.  She remains barely responsive and tries to open her eyes to auditory and painful stimuli but remains aphasic and is not following commands.  She withdraws left side but has only trace withdrawal on the right side.. Her prognosis is poor but the daughter remains optimistic and wants to continue aggressive care despite opinions from multiple MDs OBJECTIVE Vitals:   02/22/19 0700 02/22/19 1100  BP: 99/63 98/60  Pulse:    Resp:    Temp: 98.2 F (36.8 C) 98.4 F (36.9 C)  SpO2:       Physical Exam Frail malnourished looking elderly African-American lady not in distress. . Afebrile. Head is nontraumatic. Neck is supple without bruit.    Cardiac exam no murmur or gallop. Lungs are clear to auscultation. Distal pulses are well felt.  Both feet have boots with ulcerations on the heels and sacrum.Marland Kitchen Neurological Exam :  Patient is lethargic with eyes closed.  She barely opens eyes to sternal rub and auditory stimuli but will not sustain attention.  She is aphasic and does not follow commands.  She has left gaze preference but is able to move eyes partially to the right with doll's eye movements.  She blinks to threat on the left but not on the right.  She does grimace to pain.  Tongue midline.  Very occasional left sided upper and lower extremity spontaneous movements.  Tone is diminished on the right.  Right hemiplegia.  Trace withdrawal in the right lower and upper extremity to painful stimuli only.  Pertinent Laboratory Studies (past 3 days) / Diagnostics (past 24h) Recent Labs    02/20/19 0543 02/21/19 0350 02/22/19 0336  WBC 13.4* 13.6* 14.4*  HGB 8.6* 9.6* 8.9*  PLT 120* 123* 116*  NA 146* 145 148*  K 3.5 3.2* 4.3  CREATININE 1.33* 1.71* 1.97*  GLUCOSE 127* 203* 180*    No results found.   ASSESSMENT Ms. VARNELL KEAVENEY is a 85 y.o. female with left hemispheric infarct likely of embolic etiology with  residual aphasia and right hemiparesis.  A brain scan also shows significant atrophy raising concern for significant underlying dementia and her mental status being out of proportion to her stroke raises concern for nonconvulsive seizures but EEG done yesterday shows no seizure activity and only moderate to severe diffuse encephalopathy which is nonspecific.       RECOMMENDATIONS Patient has not shown any significant improvement over the last 3 days.  Patient's prognosis appears quite poor given disabling stroke as well as poor general medical condition with failure to thrive.  I had a long discussion with the patient's daughter and she is unlikely to recover to be able to swallow safely anytime soon and daughter informs me that she is not a good candidate for PEG due to poor general medical condition.  She has been provided good care by the daughter and family at home so far but her nutritional needs are going to be challenging to meet in the near future.  Daughter wants to continue support and believes she will get better discussed with Dr. Eudelia Bunch.  Stroke team will sign off.  Kindly call for questions.  Greater than 50% time during this 25-minute visit was spent on counseling and coordination of care and discussion with care team.   Hospital day # [place], [place] for Schedule & Pager information 02/22/2019 4:36 PM  To contact Stroke Continuity provider, please refer to [email or web address] After hours, contact General Neurology

## 2019-02-22 NOTE — Progress Notes (Signed)
Pt's feed tube not confirmed to be ready for NGT feeds. Kyere, NP informed and order placed to pull NCT back 2cm. DG Abd ordered and revealed tubing still coiled in stomach similar to prior exam. Lennox Grumbles, NP informed and updated.

## 2019-02-23 DIAGNOSIS — I469 Cardiac arrest, cause unspecified: Secondary | ICD-10-CM | POA: Diagnosis not present

## 2019-02-23 DIAGNOSIS — I429 Cardiomyopathy, unspecified: Secondary | ICD-10-CM | POA: Diagnosis not present

## 2019-02-23 LAB — COMPREHENSIVE METABOLIC PANEL
ALT: 20 U/L (ref 0–44)
AST: 89 U/L — ABNORMAL HIGH (ref 15–41)
Albumin: 2.5 g/dL — ABNORMAL LOW (ref 3.5–5.0)
Alkaline Phosphatase: 560 U/L — ABNORMAL HIGH (ref 38–126)
Anion gap: 13 (ref 5–15)
BUN: 53 mg/dL — ABNORMAL HIGH (ref 8–23)
CO2: 15 mmol/L — ABNORMAL LOW (ref 22–32)
Calcium: 8.5 mg/dL — ABNORMAL LOW (ref 8.9–10.3)
Chloride: 121 mmol/L — ABNORMAL HIGH (ref 98–111)
Creatinine, Ser: 2.42 mg/dL — ABNORMAL HIGH (ref 0.44–1.00)
GFR calc Af Amer: 20 mL/min — ABNORMAL LOW (ref >60)
GFR calc non Af Amer: 18 mL/min — ABNORMAL LOW (ref >60)
Glucose, Bld: 116 mg/dL — ABNORMAL HIGH (ref 70–99)
Potassium: 4.9 mmol/L (ref 3.5–5.1)
Sodium: 149 mmol/L — ABNORMAL HIGH (ref 135–145)
Total Bilirubin: 2.4 mg/dL — ABNORMAL HIGH (ref 0.3–1.2)
Total Protein: 5.4 g/dL — ABNORMAL LOW (ref 6.5–8.1)

## 2019-02-23 LAB — GLUCOSE, CAPILLARY
Glucose-Capillary: 103 mg/dL — ABNORMAL HIGH (ref 70–99)
Glucose-Capillary: 111 mg/dL — ABNORMAL HIGH (ref 70–99)
Glucose-Capillary: 89 mg/dL (ref 70–99)

## 2019-02-23 MED FILL — Medication: Qty: 1 | Status: AC

## 2019-02-26 NOTE — Death Summary Note (Signed)
Expiration Note/ Death Summary  Sydney Sydney [name]  MR#: [id number]  DOB:12/19/1930  Date of Admission: 03-03-2019 Date of Death: 2019/03/11  Attending Sydney Sydney [name]  Patient's PCP: Sydney Sydney [name], Sydney Sydney [name]  Consults:   Critical Care/Pulmonology Neurology Infectious Disease Palliative Care Cardiology  Cause of Death: Failure to thrive   Secondary Diagnoses Present on Admission: . Possible HCAP (healthcare-associated pneumonia) . Acute ischemic left MCA stroke ([place]) . SVT (supraventricular tachycardia) ([place]) . Atrial fibrillation with RVR . Metabolic acidosis . Left mainstem bronchus obstruction with distal atelectasis . Acute renal failure . Anemia . Glaucoma . History of Clostridioides difficile colitis . Sacral ulcer ([place]) . Large left Pleural effusion . Protein-calorie malnutrition, severe . Mucus plugging of bronchi       Brief H and P: Sydney Sydney [name] is a 85 y.o. 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    In the ER, pO2 60 mmHg, CT chest showed large bilateral pleural effusions, L>R as well as atelectasis of LLL and new density in left mainstem bronchus concerning for aspirated material or mucus plugging.  Please see my summary from 9/29 or previous summaries by my partners in the Hospitalist service for further details.       Hospital Course: Patient was admitted and started on supplemental O2, empiric antibiotics for possible pneumonia. Thoracentesis was attempted but patient could not tolerate. Patient subsequently developed Afib with RVR, started on diltiazem, converted back to NSR. The same day, patient became abruptly less responsive, CODE STROKE was called, and MRI brain showed new acute left MCA stroke.  She was then transferred to [facility] for stroke care.  Her  subsequent course by problem included:   Acute hypoxic respiratory failure Aspiration with blocked L mainstem bronchus, doubt distal bacterial infection/pneumonia Acute on chronic systolic and diastolic CHF Bilateral pleural effusions, left greater than right After stroke and transfer on 10/25, the patient demonstrated progressively weakening respiratory effort.  Respirations have been 30 or higher since Oct 25, and appeared to be worsening in last 36 hours. All medical therapies attempted since admission failed to improve her status (including diuretics, empiric antibiotics, Xopenex and albumin).    Her underlying cause of respiratory failure was cachexia-related respiratory muscle failure compounded by left mainstem bronchus occlusion (she is not candidate for bronchoscopy) pulmonary edema and left pleural effusion due to hypoalbuminemia and heart failure (thoracentesis was attempted and failed, she has been too unstable to reattempt since, and hypoalbuminemia has been chronic for >1 month, not responding to nutrition previously nor albumin infusion here).  Her case has been discussed with Critical Care, Cardiology, Neurology, infectious disease and Ethics, as well as my colleagues.    Around 11:05AM, nursing reported entering the room due to hypoxia.  Patient subsequently had asystolic arrest. CODE called, please see separate code documentation.  Thready pulse with Afib was recovered after 3 rounds epinephrine, 2 shocks.  I discussed with Pulmonology at the bedside, and I concur that intubation would cause more harm than benefit. Patient made DNR.  After cessation of bag-valve mask ventilation, the patient had only shallow, occasional agonal breaths.  Within minutes, she had full cardiorespiratory arrest.  She was pronounced dead at 11:55 AM.    Afib with RVR At the time of death, had been in sinus rhythm, tachycardic, not responding to IV albumin.  Acute left MCA stroke See summary  10/26.  Neurology believe this was embolic.  Patient had been completely aphasic and unable to swallow since stroke.  Was tube feed dependent.  Was not a candidate for PEG tube placement due to abdominal ascites.  Sacral decubitus ulcer  Failure to thrive Severe protein calorie malnutrition NG placed 10/25.  Pulled out partially 10/28.  Imaging showed tube still at GE junction, tube was advanced back.  Tube feeds on hold overnight.   Were to be resumed when the respiratory arrest occurred.  One tube placement confirmation x-ray implied a possible lucency in the peritoneum, but subsequent imaging did not reproduce.  There was no abdominal tenderness to suggest bowel perforation, nor would perforation be treatable in her case, although I have Sydney [name] suspicion this occurred.  History Cdiff s/p stool transplant  Hypernatremia   Normocytic anemia  AKI Non anion gap metabolic acidosis Worsening last several days, including today, even despite IV albumin and tube feeds and holding diuretics.     General appearance: Cachectic adult female. Unresponsive, still.  Cardiac: No carotid pulse, no cardiac sounds.  Telemetry shows asystole. Respiratory: No respiratory muscle movement.   Neuro: The patient makes no spontaneous movements, does not withdraw from pain.          Signed:  Edwin Sydney [name] M.D. [facility] 03-25-19, 12:04 PM Pager: [phone]

## 2019-02-26 NOTE — Plan of Care (Signed)
  Problem: Education: Goal: Knowledge of General Education information will improve Description: Including pain rating scale, medication(s)/side effects and non-pharmacologic comfort measures Outcome: Progressing   Problem: Health Behavior/Discharge Planning: Goal: Ability to manage health-related needs will improve Outcome: Progressing   Problem: Clinical Measurements: Goal: Ability to maintain clinical measurements within normal limits will improve Outcome: Progressing Goal: Will remain free from infection Outcome: Progressing Goal: Diagnostic test results will improve Outcome: Progressing Goal: Respiratory complications will improve Outcome: Progressing Goal: Cardiovascular complication will be avoided Outcome: Progressing   Problem: Activity: Goal: Risk for activity intolerance will decrease Outcome: Progressing   Problem: Nutrition: Goal: Adequate nutrition will be maintained Outcome: Progressing   Problem: Coping: Goal: Level of anxiety will decrease Outcome: Progressing   Problem: Elimination: Goal: Will not experience complications related to bowel motility Outcome: Progressing Goal: Will not experience complications related to urinary retention Outcome: Progressing   Problem: Pain Managment: Goal: General experience of comfort will improve Outcome: Progressing   Problem: Safety: Goal: Ability to remain free from injury will improve Outcome: Progressing   Problem: Skin Integrity: Goal: Risk for impaired skin integrity will decrease Outcome: Progressing   Problem: Education: Goal: Knowledge of disease or condition will improve Outcome: Progressing Goal: Knowledge of secondary prevention will improve Outcome: Progressing Goal: Knowledge of patient specific risk factors addressed and post discharge goals established will improve Outcome: Progressing   Problem: Health Behavior/Discharge Planning: Goal: Ability to manage health-related needs will  improve Outcome: Progressing   Ival Bible, BSN, RN

## 2019-02-26 NOTE — Progress Notes (Signed)
Chaplain supported the family through a code blue of the patient.  The daughter of the patient showed a tremendous use of spirituality to help cope with the death of her mother.  Brion Aliment Chaplain Resident For questions concerning this note please contact me by pager [phone]

## 2019-02-26 NOTE — Code Documentation (Signed)
  Patient Name: Sydney [name]   MRN: [id number]   Date of Birth/ Sex: [birth year] , female      Admission Date: 02/12/2019  Attending Provider: Edwin Dada, *  Primary Diagnosis: <principal problem not specified>   Indication: Pt was in her usual state of health until this AM, when she was noted to be PEA. Code blue was subsequently called. At the time of arrival on scene, ACLS protocol was underway.   Technical Description:  - CPR performance duration:  7 minutes  - Was defibrillation or cardioversion used? Yes   - Was external pacer placed? No  - Was patient intubated pre/post CPR? No   Medications Administered: Y = Yes; Blank = No Amiodarone    Atropine    Calcium    Epinephrine  Y  Lidocaine    Magnesium    Norepinephrine    Phenylephrine    Sodium bicarbonate  Y  Vasopressin     Post CPR evaluation:  - Final Status - Was patient successfully resuscitated ? Yes - What is current rhythm? V. Tach.  - What is current hemodynamic status? Stable  Miscellaneous Information:  - Labs sent, including: No  - Primary team notified?  Yes  - Family Notified? Yes, Family in room.   - Additional notes/ transfer status:  Patient made DNR after the event.      Maudie Mercury, MD  2019-03-19, 12:42 PM

## 2019-02-26 NOTE — Progress Notes (Signed)
Code blue called- CPR initiated  1110 Dr. Loleta Books at bedside.  1112  -1mg  epi admin -resume CPR  1113  -Code blue team at bedside  1114  -pulse check, VT  -shock 120 J  -1 amp bicarb  1115 1 mg epi adim   1116 pulse check-shock 120J  1118 pulse check  -PEA 1 mg epi admin  1120  -pulse check   -thready pulse- BP 108/36 SVT 180-200 BPM resp maintained via ambu bag.   1132 Discussed comfort care with family at bedside, family agreeable with plan-patient placed on NRB @ 100%.

## 2019-02-26 NOTE — Plan of Care (Signed)
deceased

## 2019-02-26 NOTE — Progress Notes (Signed)
Code note  Arrived CPR in progress.  Pt Underwent 10 minutes of ACLS including CPR and 3 round of epinephrine.  + ROSC w/ spont respiratory efforts noted but not effective.   I spoke w/ daughter. Told her that we had no more to offer and that her mother was not going to survive. I offered her the choice of continued support with the code team BUT no more CPR. Or the choice of spending the time with her mother. She later requested that all folks not caring for her mother leave the room.   We placed her on high flow oxygen.  Family at bedside  Erick Colace ACNP-BC [place] Pager # [phone] OR # [phone] if no answer

## 2019-02-26 NOTE — Progress Notes (Signed)
Pt's daughter called for updates at 0605. She queried Plavix being on pt's med list. Daughter informed that there was no Plavix ordered for last night.

## 2019-02-26 NOTE — Code Documentation (Addendum)
PCCM Code Blue Note  Responded to CODE BLUE for Sydney [name] in 42w37.  She is known to our service, has a history of hypertension, progressive decline with inadequate airway protection, recurrent pneumonias, malnutrition, evolving respiratory failure.  Unfortunately most recently also sustained an acute L MCA stroke.   On my arrival CPR was in progress, the patient had received 2 rounds of epinephrine and was receiving bag mask ventilation.  Based on the progressive nature of her respiratory failure, its underlying causes, and its irreversibility, it was clear that the patient would not ultimately benefit or have an opportunity to return to her previous function or quality of life were she to require invasive mechanical ventilation.  Intubation was deferred as I deemed this to be a futile intervention.  This has been mentioned in prior notes and concurrence documented by multiple providers.  CPR was continued as was bag mask ventilation.  After her third round of epinephrine patient regained a thready pulse with SVT on telemetry.  A blood pressure was obtainable, [id number] systolic.  Even with restoration of spontaneous circulation the patient exhibited only an inefficient agonal respiratory pattern, no neurological responsiveness.  At this point it was clear to me that this episode was not ultimately survivable.  Decision was made to defer any further CPR, ACLS drugs, extraordinary support as he was deemed to be ineffective.  Bag mask ventilation was transitioned over to 1.00 NRB mask.  The events were explained to the patient's daughter who was present at bedside during the resuscitation efforts.  Emotional support offered.  The room was cleared to make way for Sydney [name] to spend time with her mother with the expectation that the patient would not remain stable for very long.  Case discussed with Dr. Loleta Books the patient's medical attending physician who was present for the CODE BLUE resuscitation efforts.  Dr.  Loleta Books was in agreement that all reasonable extraordinary support had been given.  Sydney Apo, MD, PhD 03/02/2019, 12:04 PM [facility] [phone] or if no answer [phone]

## 2019-02-26 DEATH — deceased

## 2019-03-06 ENCOUNTER — Encounter (HOSPITAL_BASED_OUTPATIENT_CLINIC_OR_DEPARTMENT_OTHER): Payer: Medicare Other | Admitting: Internal Medicine

## 2020-06-28 IMAGING — US US CHEST/MEDIASTINUM
1 series · 2 of 2 positions shown · non-contrast
Comparison: CT of the chest on 02/15/2019

CLINICAL DATA: Pleural effusions.

EXAM:
CHEST ULTRASOUND

[Series 1: us chest/mediastinum · 2 of 2 slices shown]
[im 1/2]
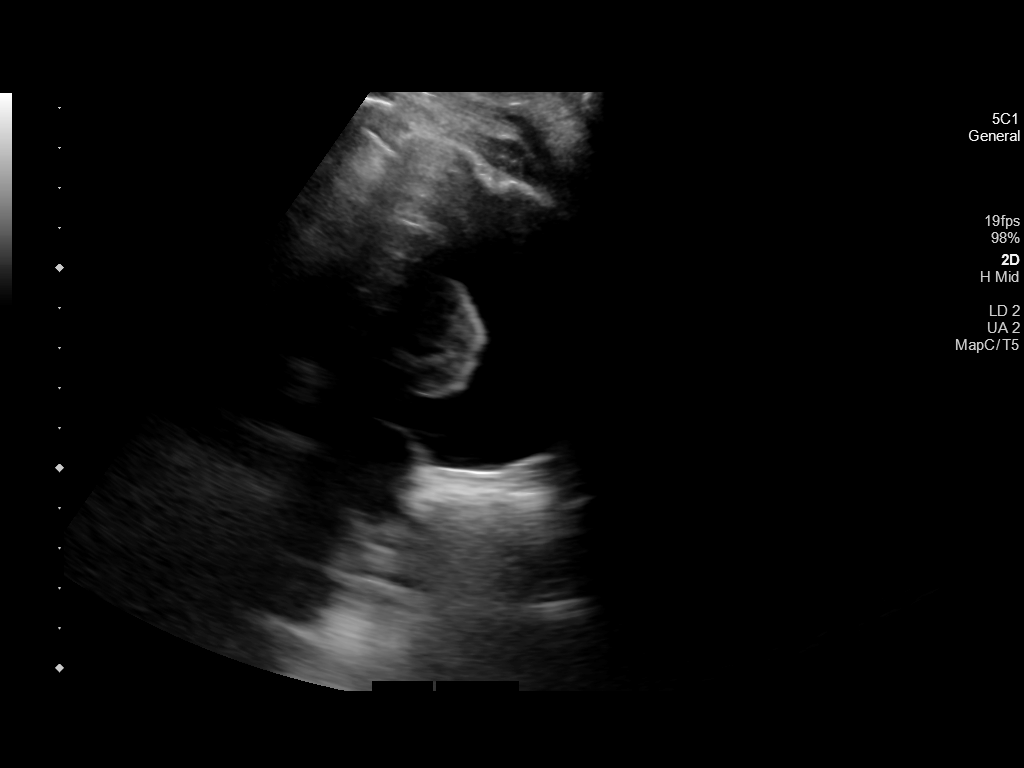
[im 2/2]
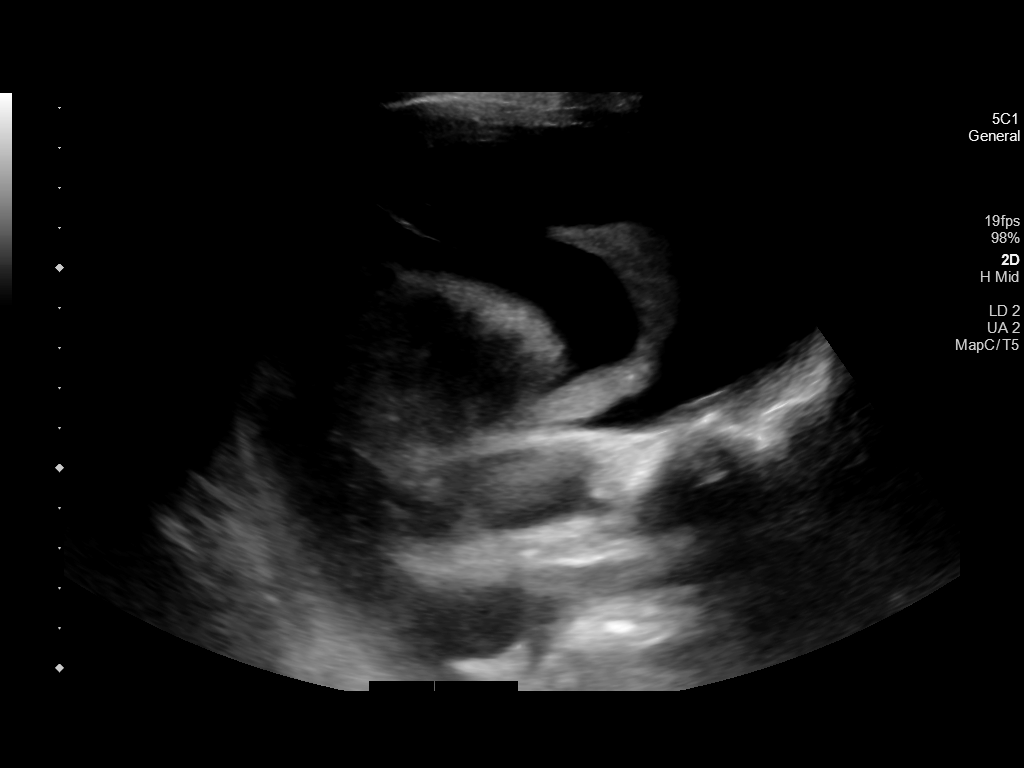

[2 of 2 positions shown; findings below may reference images not displayed]

FINDINGS: The patient was brought down for a planned left thoracentesis.
Ultrasound demonstrates a moderate left pleural effusion. While
preparing to perform the procedure, the patient became combative,
agitated and refused to have the procedure performed. It was not
safe to proceed with attempted thoracentesis today.
IMPRESSION: Aborted left thoracentesis procedure due to lack of patient
cooperation and refusal to have the procedure performed.

## 2020-06-29 IMAGING — CT CT ANGIO HEAD
3 of 8 series · 9 of 36 positions shown · IV contrast (APPLIED)
Comparison: Prior CT and MRI from earlier same day.

CLINICAL DATA: Initial evaluation for acute stroke.

EXAM:
CT ANGIOGRAPHY HEAD AND NECK
TECHNIQUE: Multidetector CT imaging of the head and neck was performed using
the standard protocol during bolus administration of intravenous
contrast. Multiplanar CT image reconstructions and MIPs were
obtained to evaluate the vascular anatomy. Carotid stenosis
measurements (when applicable) are obtained utilizing NASCET
criteria, using the distal internal carotid diameter as the
denominator.
CONTRAST:  75mL OMNIPAQUE IOHEXOL 350 MG/ML SOLN

[Series 6: cta head neck thins · axial · 0.47mm/px · z∈[-168,+48]mm · 5 of 626 slices shown]
[im 105/626  soft-tissue]
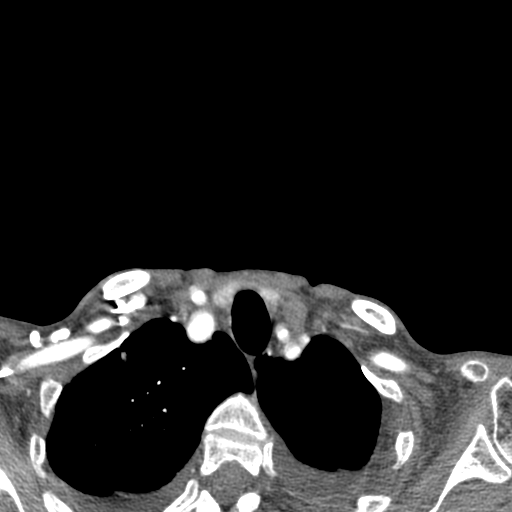
[im 209/626  bone]
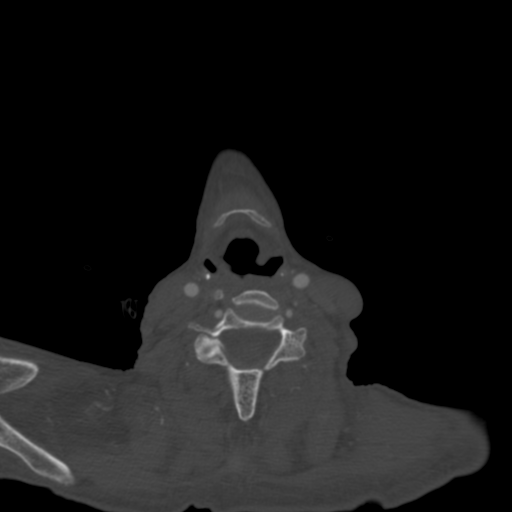
[im 313/626  soft-tissue]
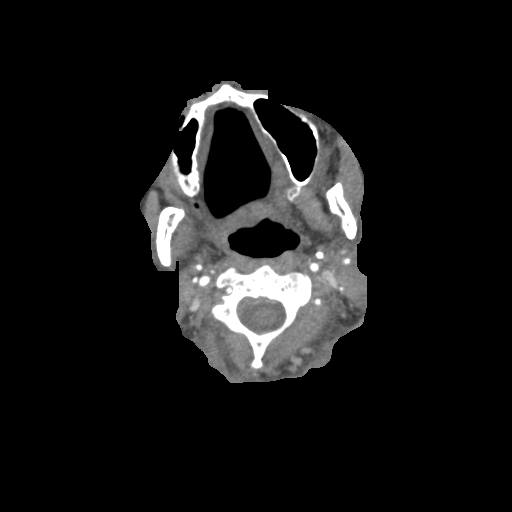
[im 417/626  bone]
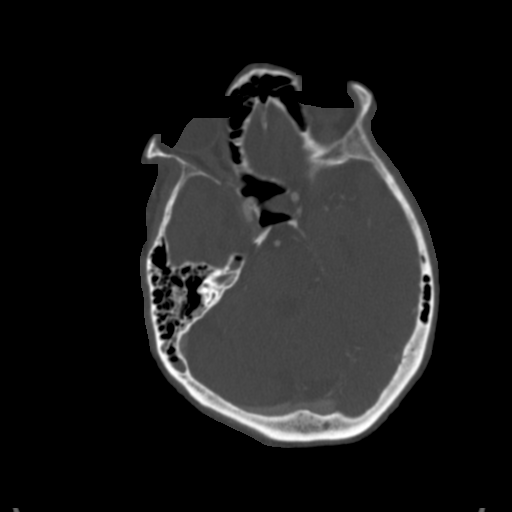
[im 521/626  soft-tissue]
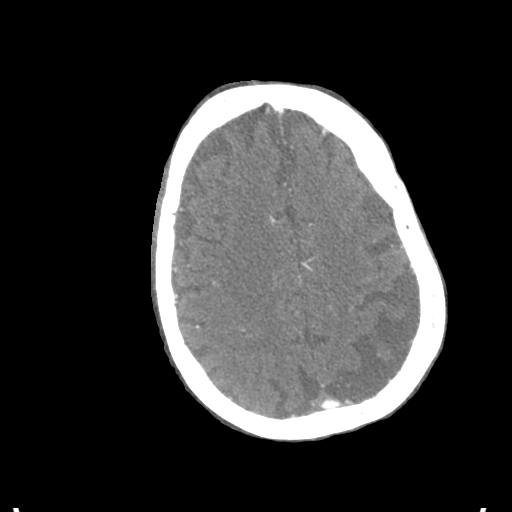

[Series 7: ax thin · axial · 0.39mm/px · z∈[-121,-7]mm · 2 of 321 slices shown]
[im 107/321  soft-tissue]
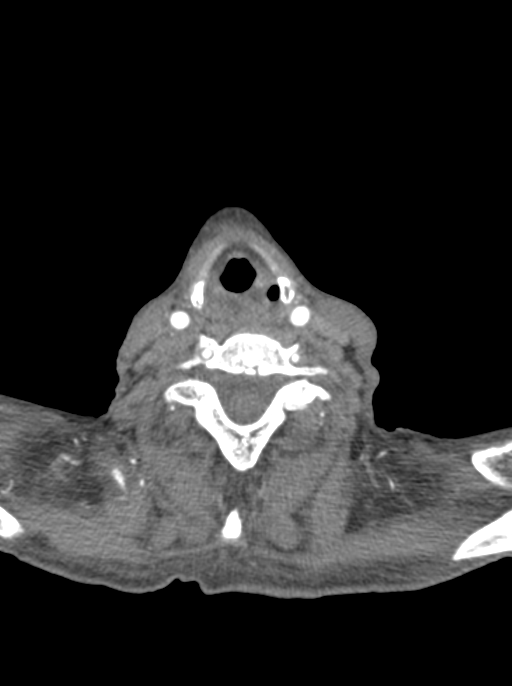
[im 214/321  soft-tissue]
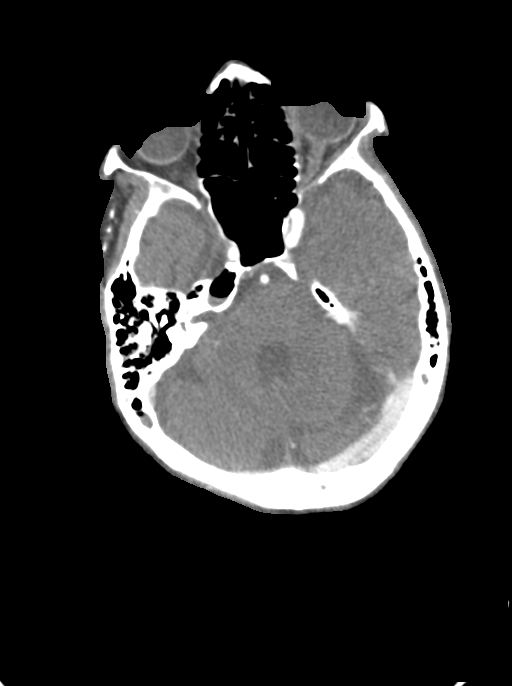

[Series 9: sagittal thin · sagittal · 0.53mm/px · 2 of 200 slices shown]
[im 51/200  soft-tissue]
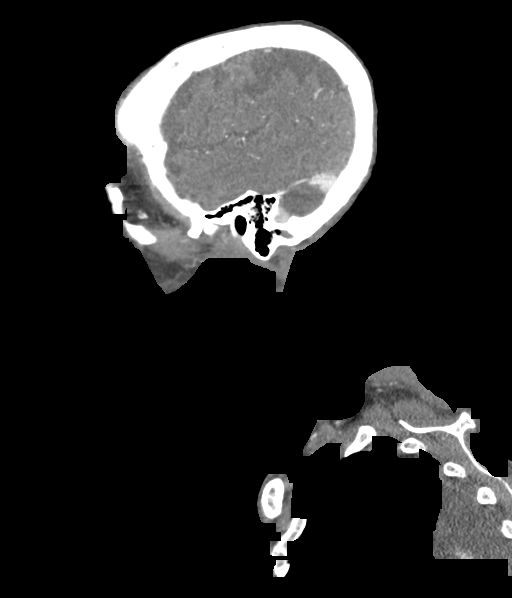
[im 150/200  soft-tissue]
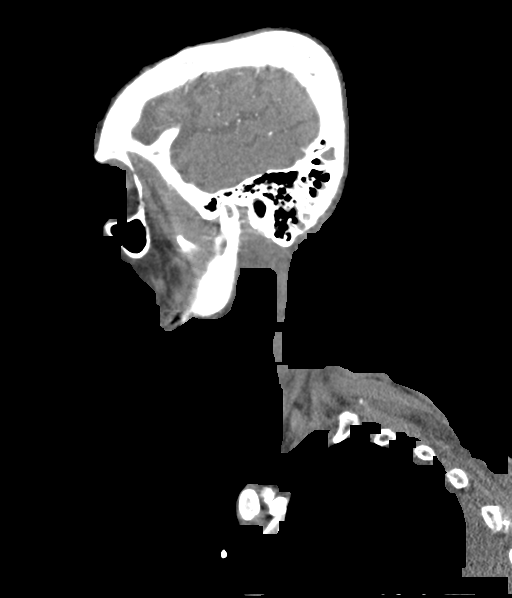

[9 of 36 positions shown; findings below may reference images not displayed]

FINDINGS: CTA NECK FINDINGS

Aortic arch: Visualized aortic arch of normal caliber with normal 3
vessel morphology. Mild atheromatous plaque within the arch and
about the origin of the great vessels without hemodynamically
significant stenosis. Visualized subclavian arteries widely patent.

Right carotid system: Right common and internal carotid arteries
widely patent without stenosis, dissection or occlusion. Mild
atheromatous plaque about the right bifurcation without significant
narrowing.

Left carotid system: Left common carotid artery widely patent from
its origin to the bifurcation. Bulky eccentric calcified plaque at
the origin of the left ICA with no more than mild 25% stenosis. Left
ICA widely patent distally to the skull base without stenosis,
dissection or occlusion.

Vertebral arteries: Both vertebral arteries arise from the
subclavian arteries. Right vertebral artery slightly dominant.
Vertebral arteries widely patent within the neck without stenosis,
dissection or occlusion.

Skeleton: No acute osseous finding. No discrete lytic or blastic
osseous lesions. Degenerative disc bulging noted at C6-7 without
significant stenosis.

Other neck: No other acute soft tissue abnormality within the neck.
No mass lesion or adenopathy.

Upper chest: Large left with moderate right layering pleural
effusions with associated atelectasis partially visualized. Oblique
linear densities within the anterior right upper lobe most
consistent with atelectasis and/or scarring. Visualized upper chest
demonstrates no other acute finding.

Review of the MIP images confirms the above findings

CTA HEAD FINDINGS

Anterior circulation: Petrous segments widely patent bilaterally.
Mild scattered atheromatous plaque within the carotid siphons
without significant stenosis. A1 segments, anterior communicating
artery common anterior cerebral arteries widely patent. M1 segments
widely patent bilaterally. Normal MCA bifurcations. On the left,
there is abrupt occlusion of a proximal left M3 branch (series 6,
image 463), in keeping with the left MCA territory infarct. Left MCA
branches otherwise perfused. Distal right MCA branches well perfused
without obstruction.

Posterior circulation: Vertebral arteries widely patent to the
vertebrobasilar junction without stenosis. Both picas patent.
Basilar widely patent to its distal aspect without stenosis.
Superior cerebral arteries patent bilaterally. Right PCA supplied
via the basilar. Left PCA supplied via a hypoplastic left P1 segment
as well as a robust left posterior communicating artery. PCAs widely
patent to their distal aspects without stenosis.

Venous sinuses: Patent. Calcified left parafalcine meningioma again
noted. No invasion of the adjacent superior sagittal sinus.

Anatomic variants: None significant.

Review of the MIP images confirms the above findings
IMPRESSION: 1. Acute proximal left M3 occlusion, consistent with the known left
MCA territory infarct.
2. Mild scattered atherosclerotic change elsewhere throughout the
major arterial vasculature of the head and neck. No other
hemodynamically significant or correctable stenosis.
3. Large left greater than right layering pleural effusions,
partially visualized.
